# Patient Record
Sex: Female | Born: 1940 | Race: White | Hispanic: No | Smoking: Former smoker
Health system: Southern US, Community
[De-identification: ages and names within clinical notes are randomized; demographics above are authoritative.]

## PROBLEM LIST (undated history)

## (undated) DIAGNOSIS — G43909 Migraine, unspecified, not intractable, without status migrainosus: Secondary | ICD-10-CM

## (undated) DIAGNOSIS — K219 Gastro-esophageal reflux disease without esophagitis: Secondary | ICD-10-CM

## (undated) DIAGNOSIS — M1990 Unspecified osteoarthritis, unspecified site: Secondary | ICD-10-CM

## (undated) DIAGNOSIS — D759 Disease of blood and blood-forming organs, unspecified: Secondary | ICD-10-CM

## (undated) DIAGNOSIS — R0602 Shortness of breath: Secondary | ICD-10-CM

## (undated) DIAGNOSIS — E785 Hyperlipidemia, unspecified: Secondary | ICD-10-CM

## (undated) DIAGNOSIS — N189 Chronic kidney disease, unspecified: Secondary | ICD-10-CM

## (undated) DIAGNOSIS — M545 Low back pain, unspecified: Secondary | ICD-10-CM

## (undated) DIAGNOSIS — I10 Essential (primary) hypertension: Secondary | ICD-10-CM

## (undated) DIAGNOSIS — J449 Chronic obstructive pulmonary disease, unspecified: Secondary | ICD-10-CM

## (undated) DIAGNOSIS — R32 Unspecified urinary incontinence: Secondary | ICD-10-CM

## (undated) DIAGNOSIS — Z9289 Personal history of other medical treatment: Secondary | ICD-10-CM

## (undated) DIAGNOSIS — J189 Pneumonia, unspecified organism: Secondary | ICD-10-CM

## (undated) DIAGNOSIS — I2699 Other pulmonary embolism without acute cor pulmonale: Secondary | ICD-10-CM

## (undated) DIAGNOSIS — D649 Anemia, unspecified: Secondary | ICD-10-CM

## (undated) DIAGNOSIS — K625 Hemorrhage of anus and rectum: Secondary | ICD-10-CM

## (undated) DIAGNOSIS — Z9981 Dependence on supplemental oxygen: Secondary | ICD-10-CM

## (undated) DIAGNOSIS — J939 Pneumothorax, unspecified: Secondary | ICD-10-CM

## (undated) DIAGNOSIS — J42 Unspecified chronic bronchitis: Secondary | ICD-10-CM

## (undated) DIAGNOSIS — G8929 Other chronic pain: Secondary | ICD-10-CM

## (undated) DIAGNOSIS — J45909 Unspecified asthma, uncomplicated: Secondary | ICD-10-CM

## (undated) DIAGNOSIS — R51 Headache: Secondary | ICD-10-CM

## (undated) HISTORY — PX: TUBAL LIGATION: SHX77

## (undated) HISTORY — PX: CATARACT EXTRACTION W/ INTRAOCULAR LENS  IMPLANT, BILATERAL: SHX1307

## (undated) HISTORY — DX: Anemia, unspecified: D64.9

## (undated) HISTORY — PX: HEMIARTHROPLASTY SHOULDER FRACTURE: SUR653

## (undated) HISTORY — PX: FRACTURE SURGERY: SHX138

## (undated) HISTORY — PX: LAPAROSCOPIC CHOLECYSTECTOMY: SUR755

## (undated) HISTORY — DX: Hyperlipidemia, unspecified: E78.5

## (undated) HISTORY — PX: ABDOMINAL HYSTERECTOMY: SHX81

---

## 2003-09-22 ENCOUNTER — Emergency Department (HOSPITAL_COMMUNITY): Admission: EM | Admit: 2003-09-22 | Discharge: 2003-09-22 | Payer: Self-pay

## 2004-06-05 ENCOUNTER — Inpatient Hospital Stay (HOSPITAL_COMMUNITY): Admission: EM | Admit: 2004-06-05 | Discharge: 2004-06-20 | Payer: Self-pay | Admitting: Emergency Medicine

## 2004-06-16 ENCOUNTER — Encounter (INDEPENDENT_AMBULATORY_CARE_PROVIDER_SITE_OTHER): Payer: Self-pay | Admitting: *Deleted

## 2004-07-03 ENCOUNTER — Encounter: Admission: RE | Admit: 2004-07-03 | Discharge: 2004-07-03 | Payer: Self-pay | Admitting: Cardiothoracic Surgery

## 2006-02-02 IMAGING — CR DG CHEST 2V
2 series · 2 of 2 positions shown · non-contrast
Comparison: none

CLINICAL DATA: Chest pain and shortness of breath.  Weakness.
 TWO-VIEW CHEST   
 Pulmonary hyperinflation is seen, consistent with COPD.  Prominence of the interstitial markings is noted.  There is no evidence of acute infiltrate or edema.  There is no evidence of pleural effusion.  No masses or adenopathy identified.
 IMPRESSION 
 COPD.  No active disease.

[view not recorded (1 of 2)]
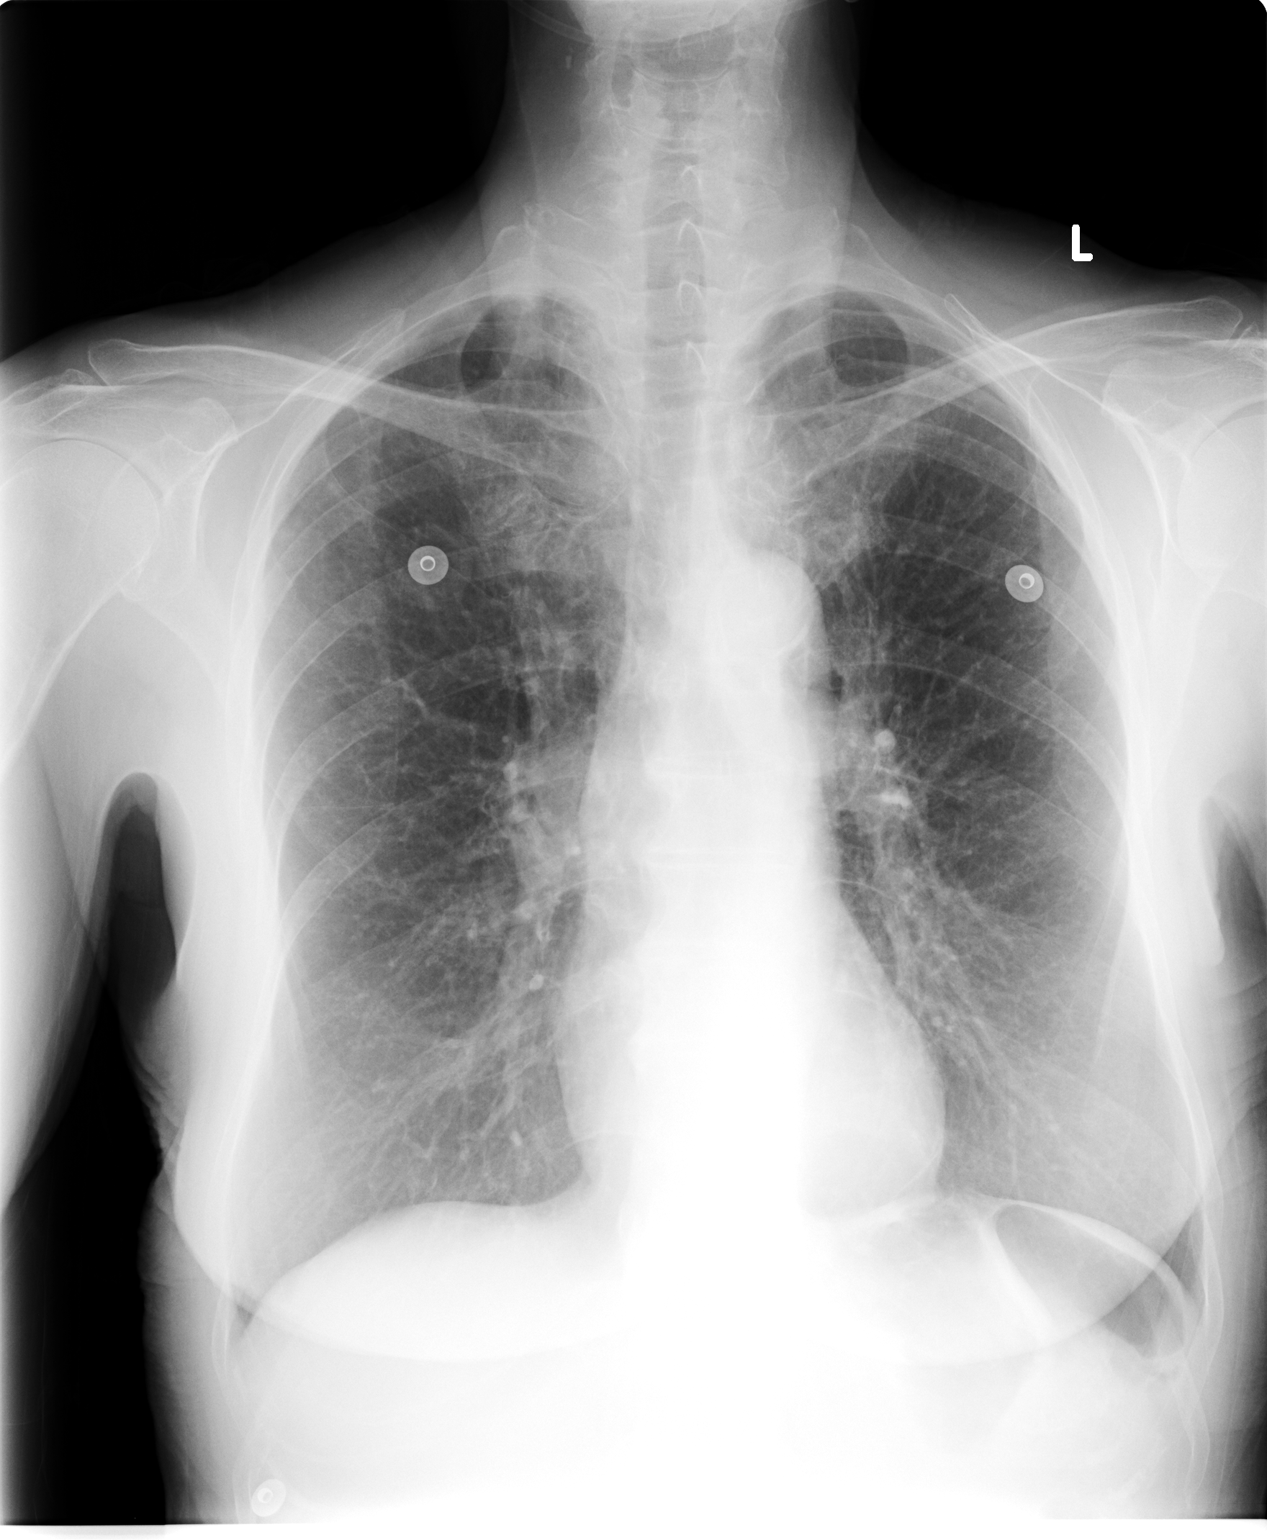

[view not recorded (2 of 2)]
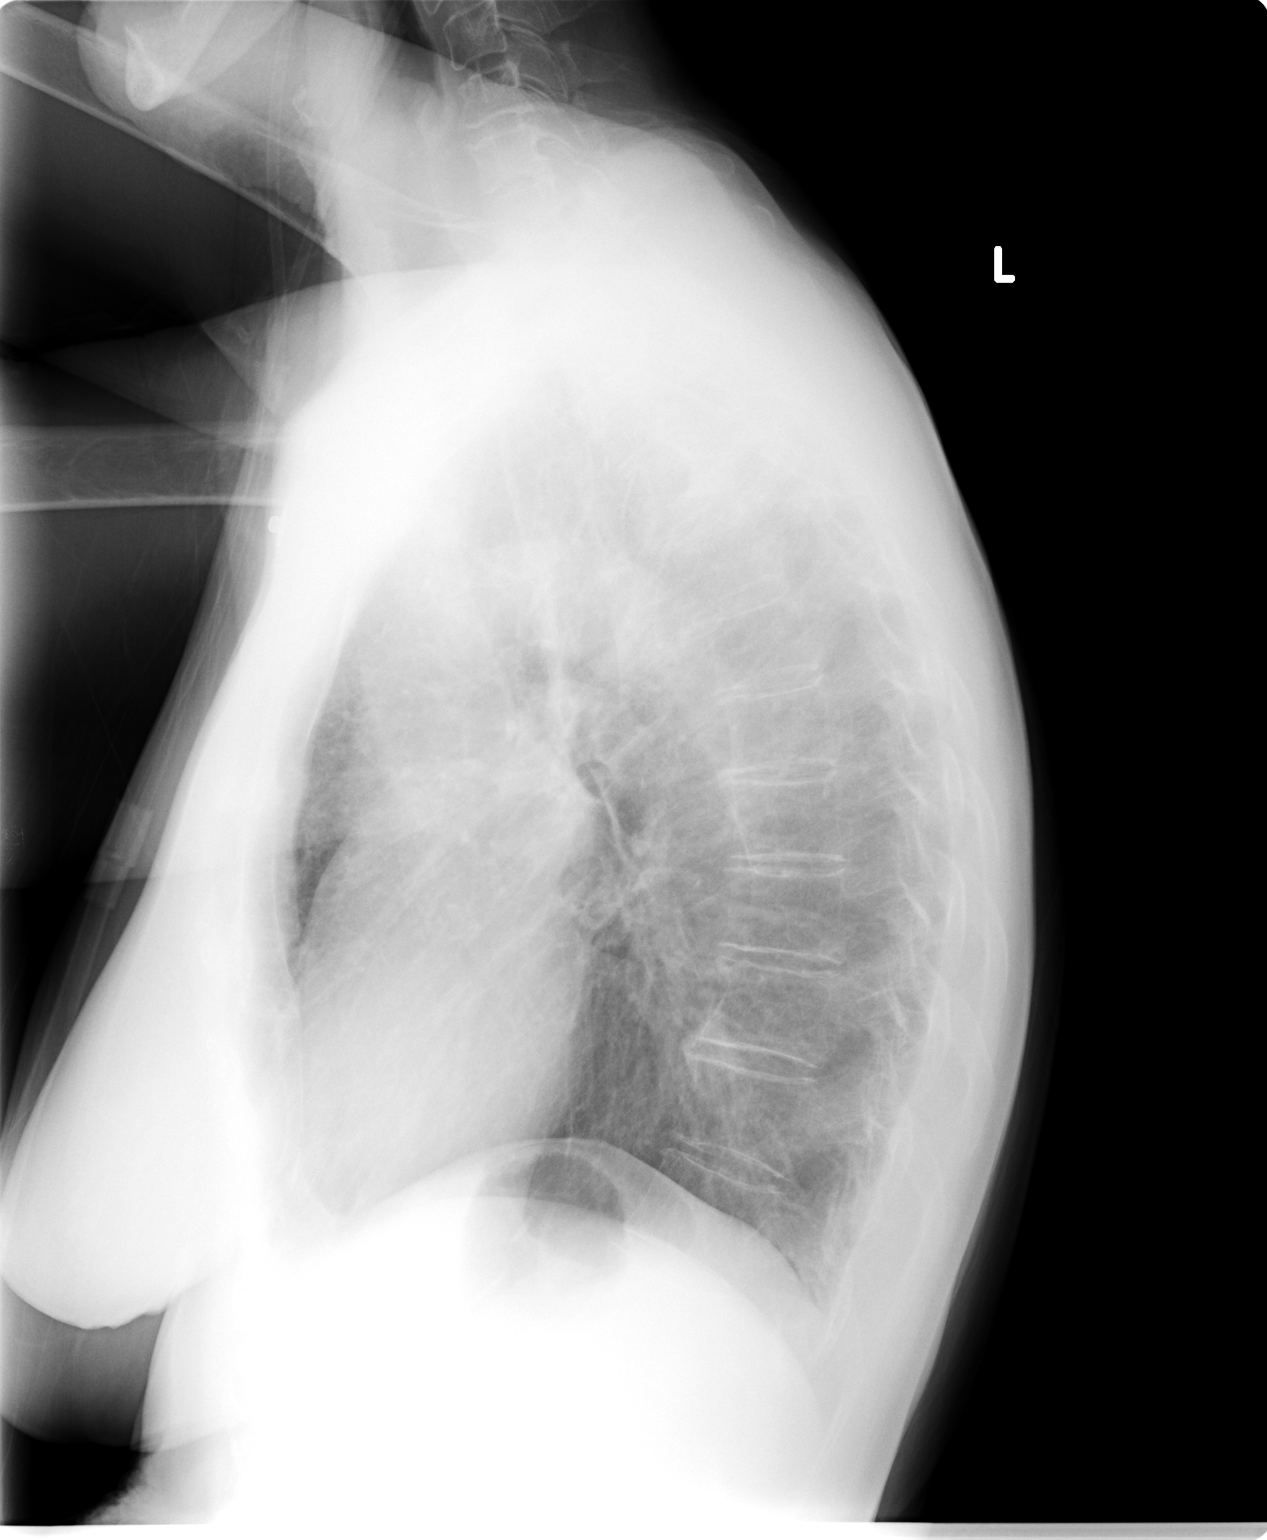

[2 of 2 positions shown; findings below may reference images not displayed]

## 2006-06-13 ENCOUNTER — Inpatient Hospital Stay (HOSPITAL_COMMUNITY): Admission: EM | Admit: 2006-06-13 | Discharge: 2006-06-17 | Payer: Self-pay | Admitting: Emergency Medicine

## 2006-09-06 ENCOUNTER — Encounter (INDEPENDENT_AMBULATORY_CARE_PROVIDER_SITE_OTHER): Payer: Self-pay | Admitting: Family Medicine

## 2006-09-06 ENCOUNTER — Ambulatory Visit (HOSPITAL_COMMUNITY): Admission: RE | Admit: 2006-09-06 | Discharge: 2006-09-06 | Payer: Self-pay | Admitting: Family Medicine

## 2006-10-17 IMAGING — CR DG CHEST 2V
2 series · 2 of 2 positions shown · non-contrast
Comparison: 09/22/03.

CLINICAL DATA: Chest pain and shortness of breath.  COPD.
 [id number] VIEW:

[w chest pa]
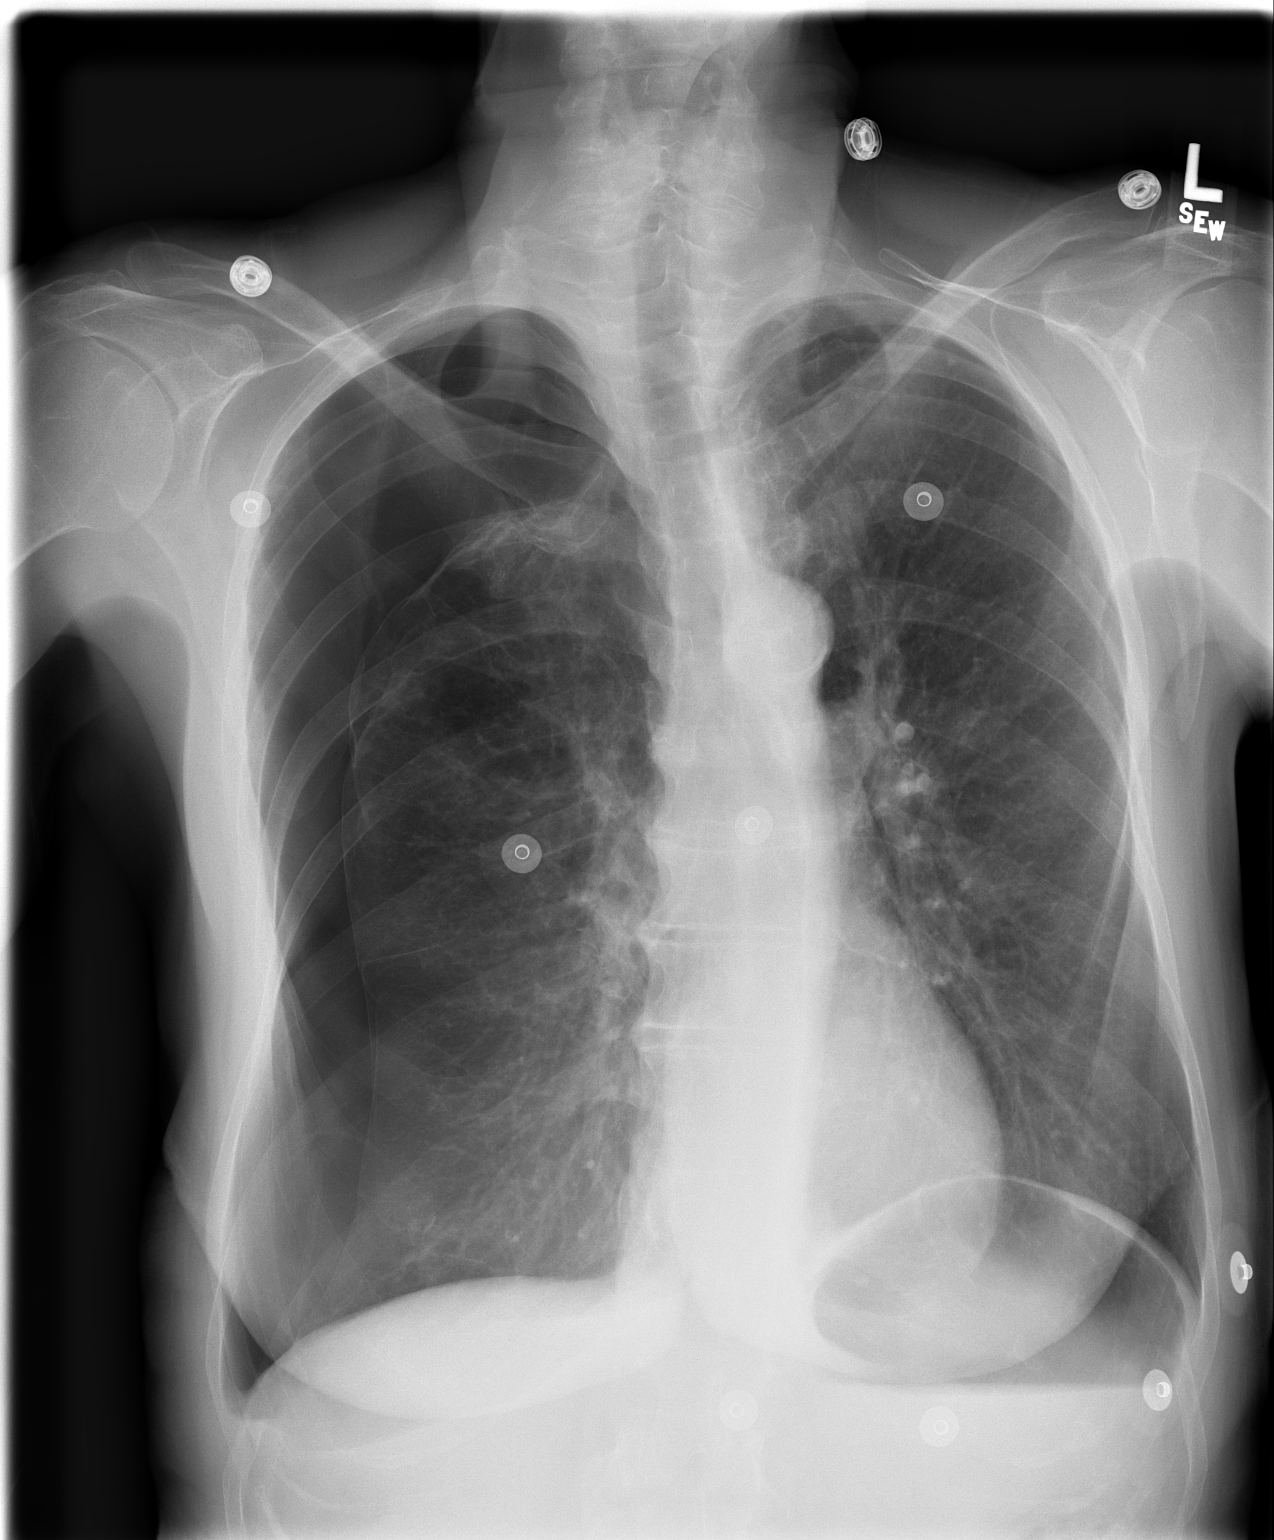

[w chest lat]
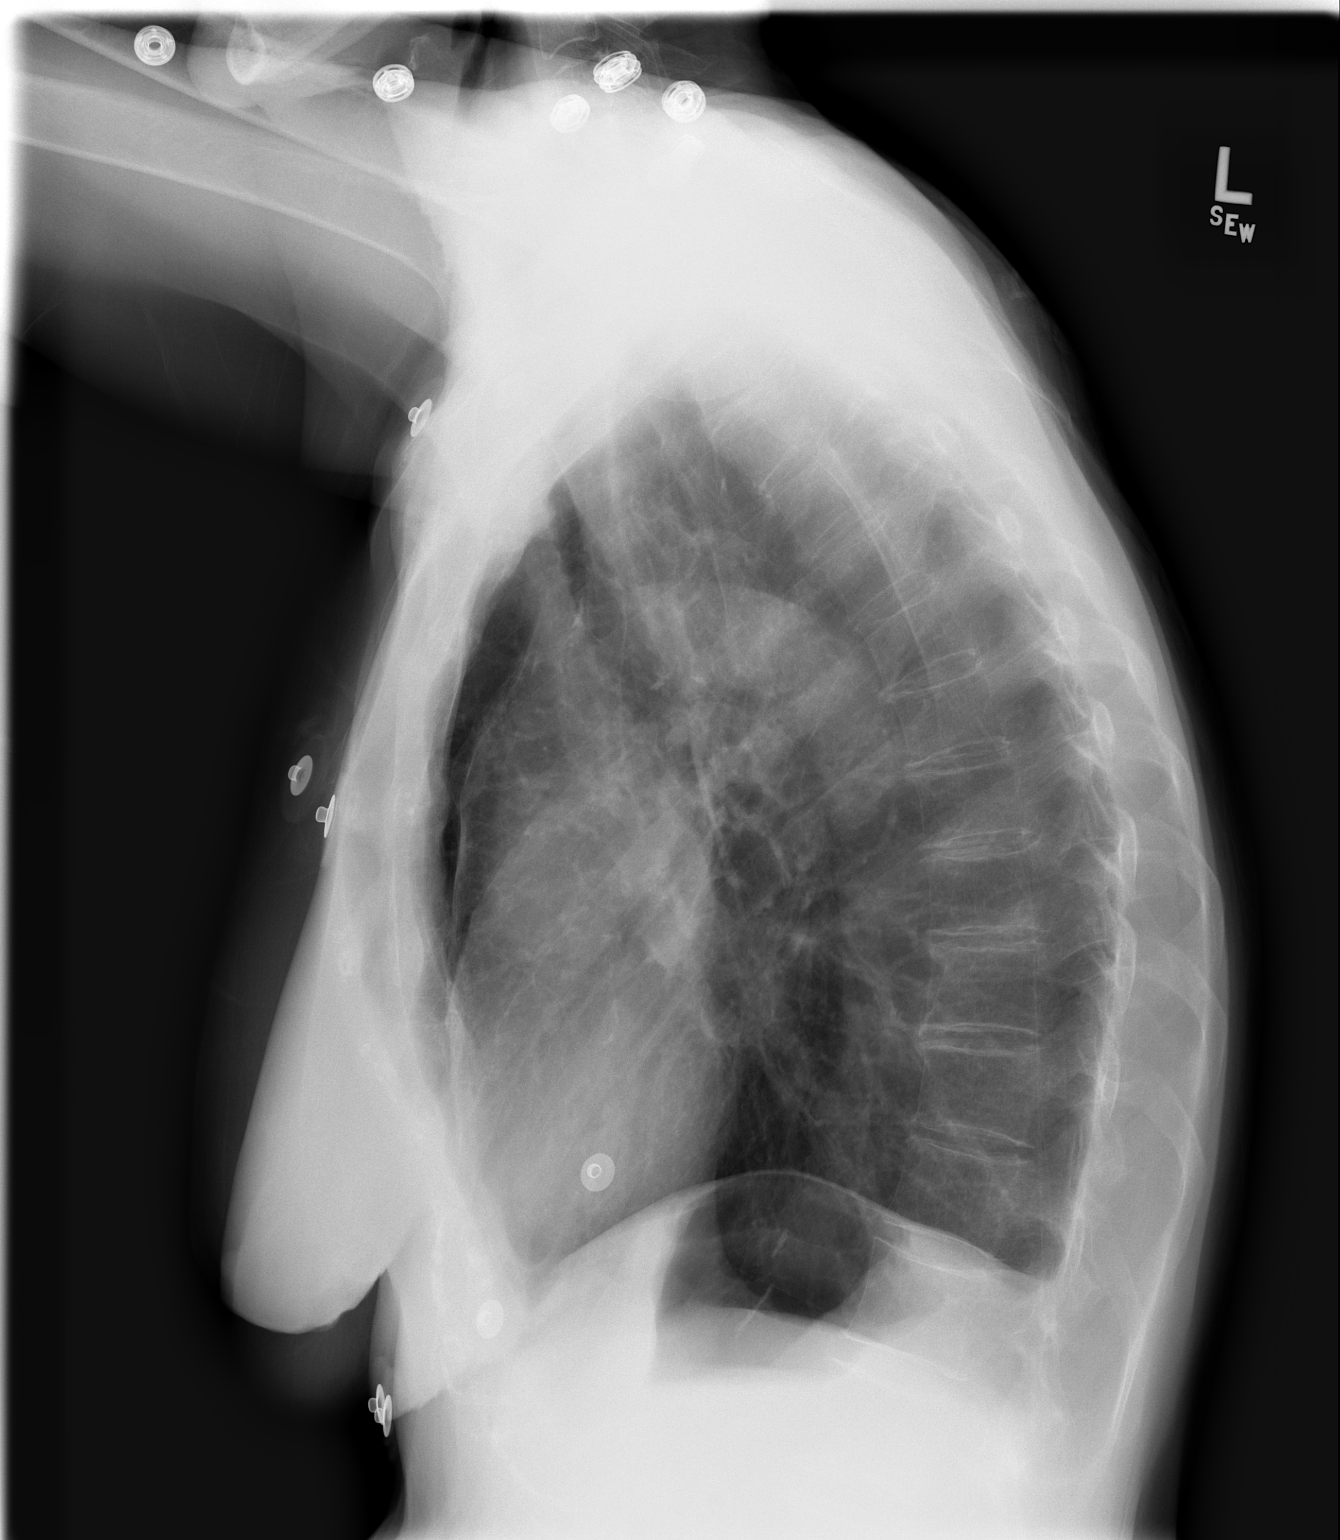

[2 of 2 positions shown; findings below may reference images not displayed]

A large approximately 40% right pneumothorax is seen with mild mediastinal shift.
 Changes of COPD are noted. There is no evidence of pulmonary infiltrate. Heart size and mediastinal contours are normal.
IMPRESSION: 1.  Large right tension pneumothorax approximately 40% in size.  
 2.  COPD.
 3.  These findings were called to Dr. Rkoma in the [HOSPITAL] the time of this dictation at 9369 hours.

## 2006-10-17 IMAGING — CR DG CHEST 1V PORT
1 series · 1 of 1 positions shown · non-contrast
Comparison: Prior study earlier today.

CLINICAL DATA: Follow-up right pneumothorax after chest tube placement.  COPD.  
 PORTABLE CHEST - 1 VIEW 06/05/04 AT 5525 HOURS:

[view not recorded]
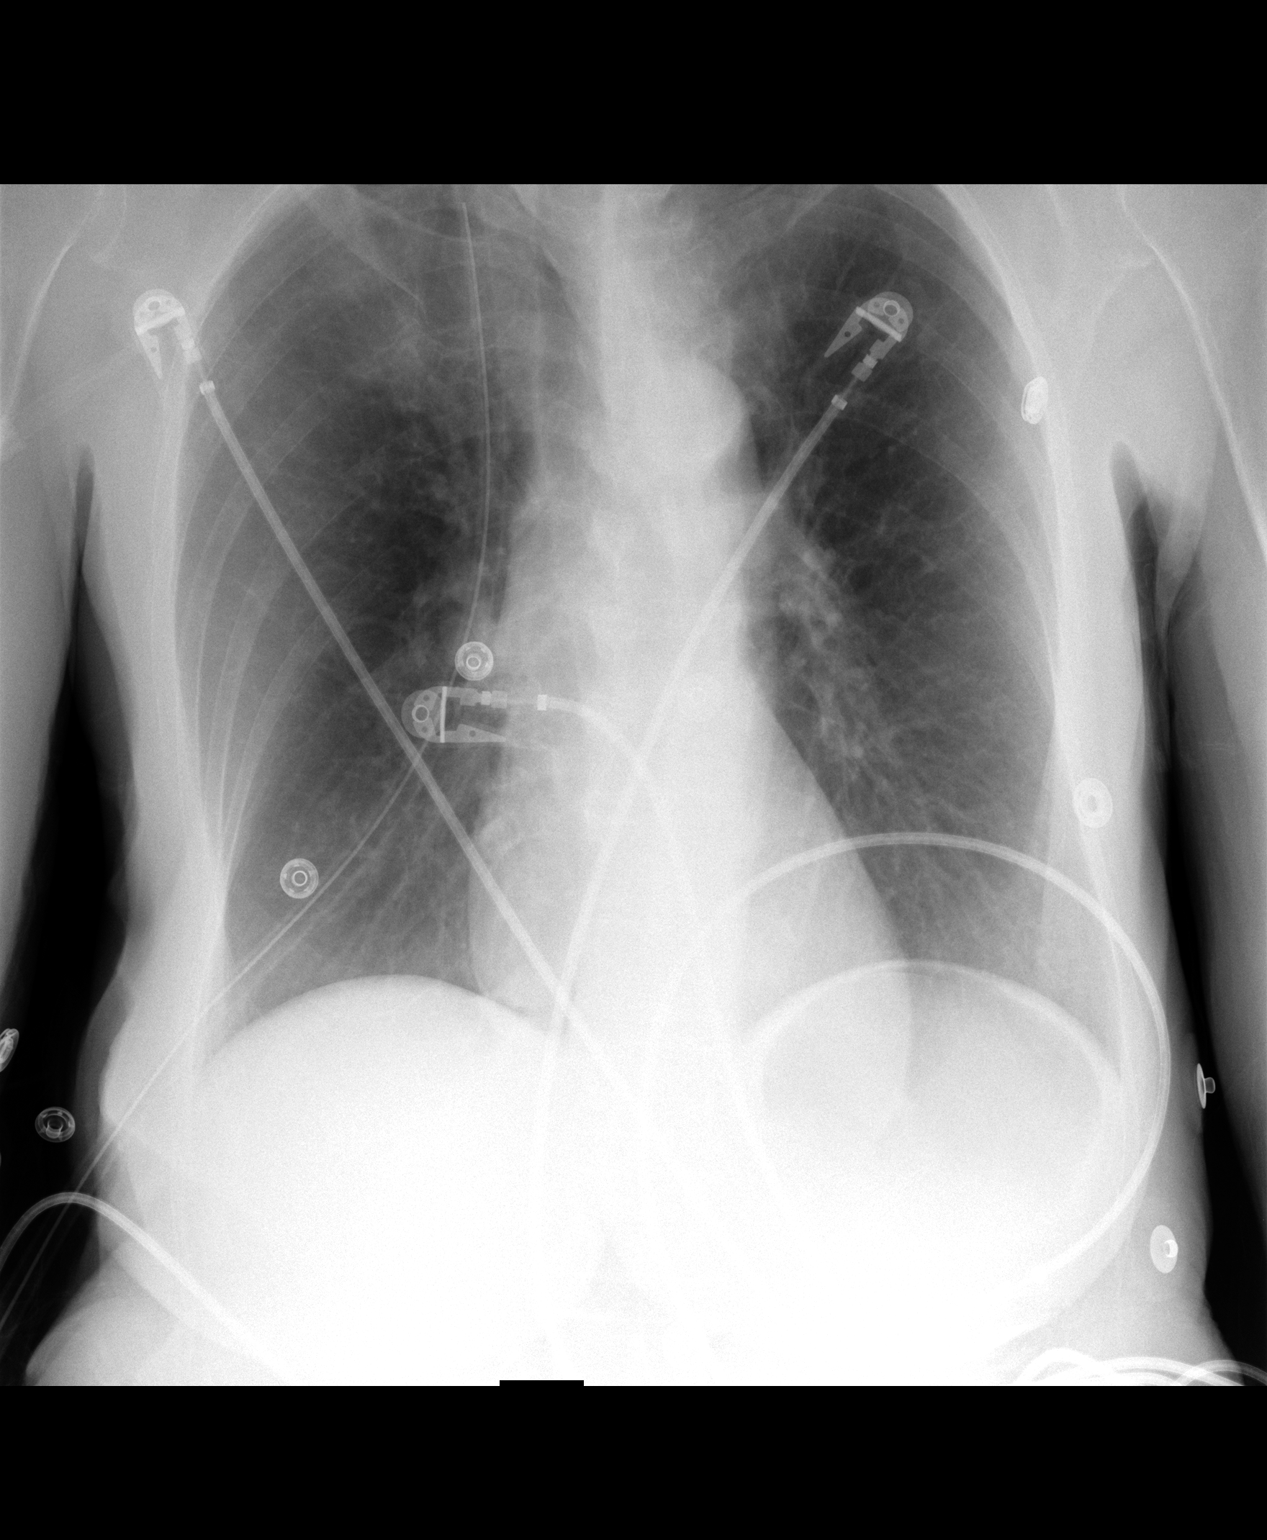

[1 of 1 positions shown; findings below may reference images not displayed]

There has been placement of a right-sided chest tube in appropriate position.  Near complete resolution of right pneumothorax is seen with only a tiny residual, less than 5 percent, right apical component seen.  
 Changes of COPD are again noted.  Both lungs are clear.  Heart size is normal.
IMPRESSION: 1.  Near complete resolution of right pneumothorax following chest tube placement.  
 2.  COPD.

## 2006-10-19 IMAGING — CR DG CHEST 1V PORT
1 series · 1 of 1 positions shown · non-contrast
Comparison: 06/05/04.

CLINICAL DATA: Placement of right chest tube for pneumothorax.
 PORTABLE SINGLE VIEW CHEST ? 06/07/04, [DATE]:

[view not recorded]
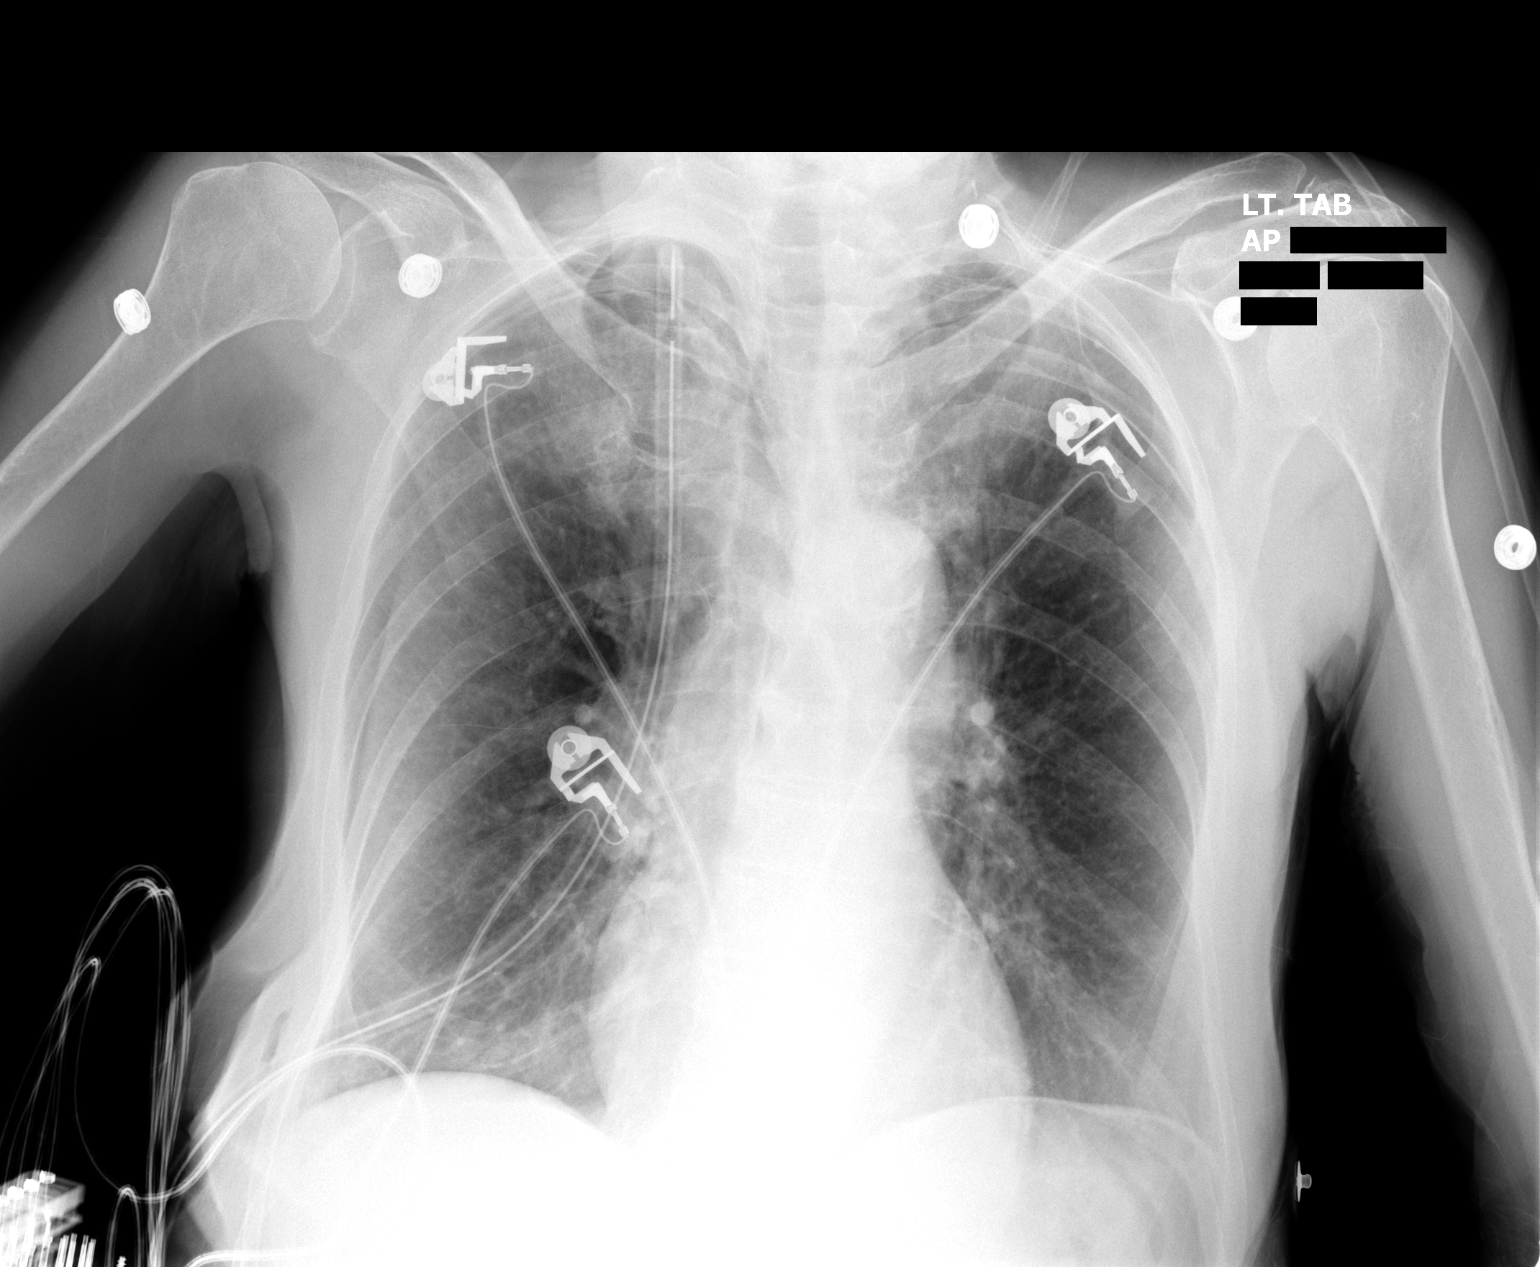

[1 of 1 positions shown; findings below may reference images not displayed]

Right tube remains in place.  There is slight interval increase in size of right apical pneumothorax now estimated to be 5 to 10 percent of volume.  No underlying edema.
IMPRESSION: Increase in right apical pneumothorax to 5 to 10 percent volume.

## 2006-10-20 IMAGING — CR DG CHEST 1V PORT
1 series · 1 of 1 positions shown · non-contrast
Comparison: 06/07/2004

CLINICAL DATA: Pneumothorax, chest tube

PORTABLE CHEST - 1 VIEW:

[view not recorded]
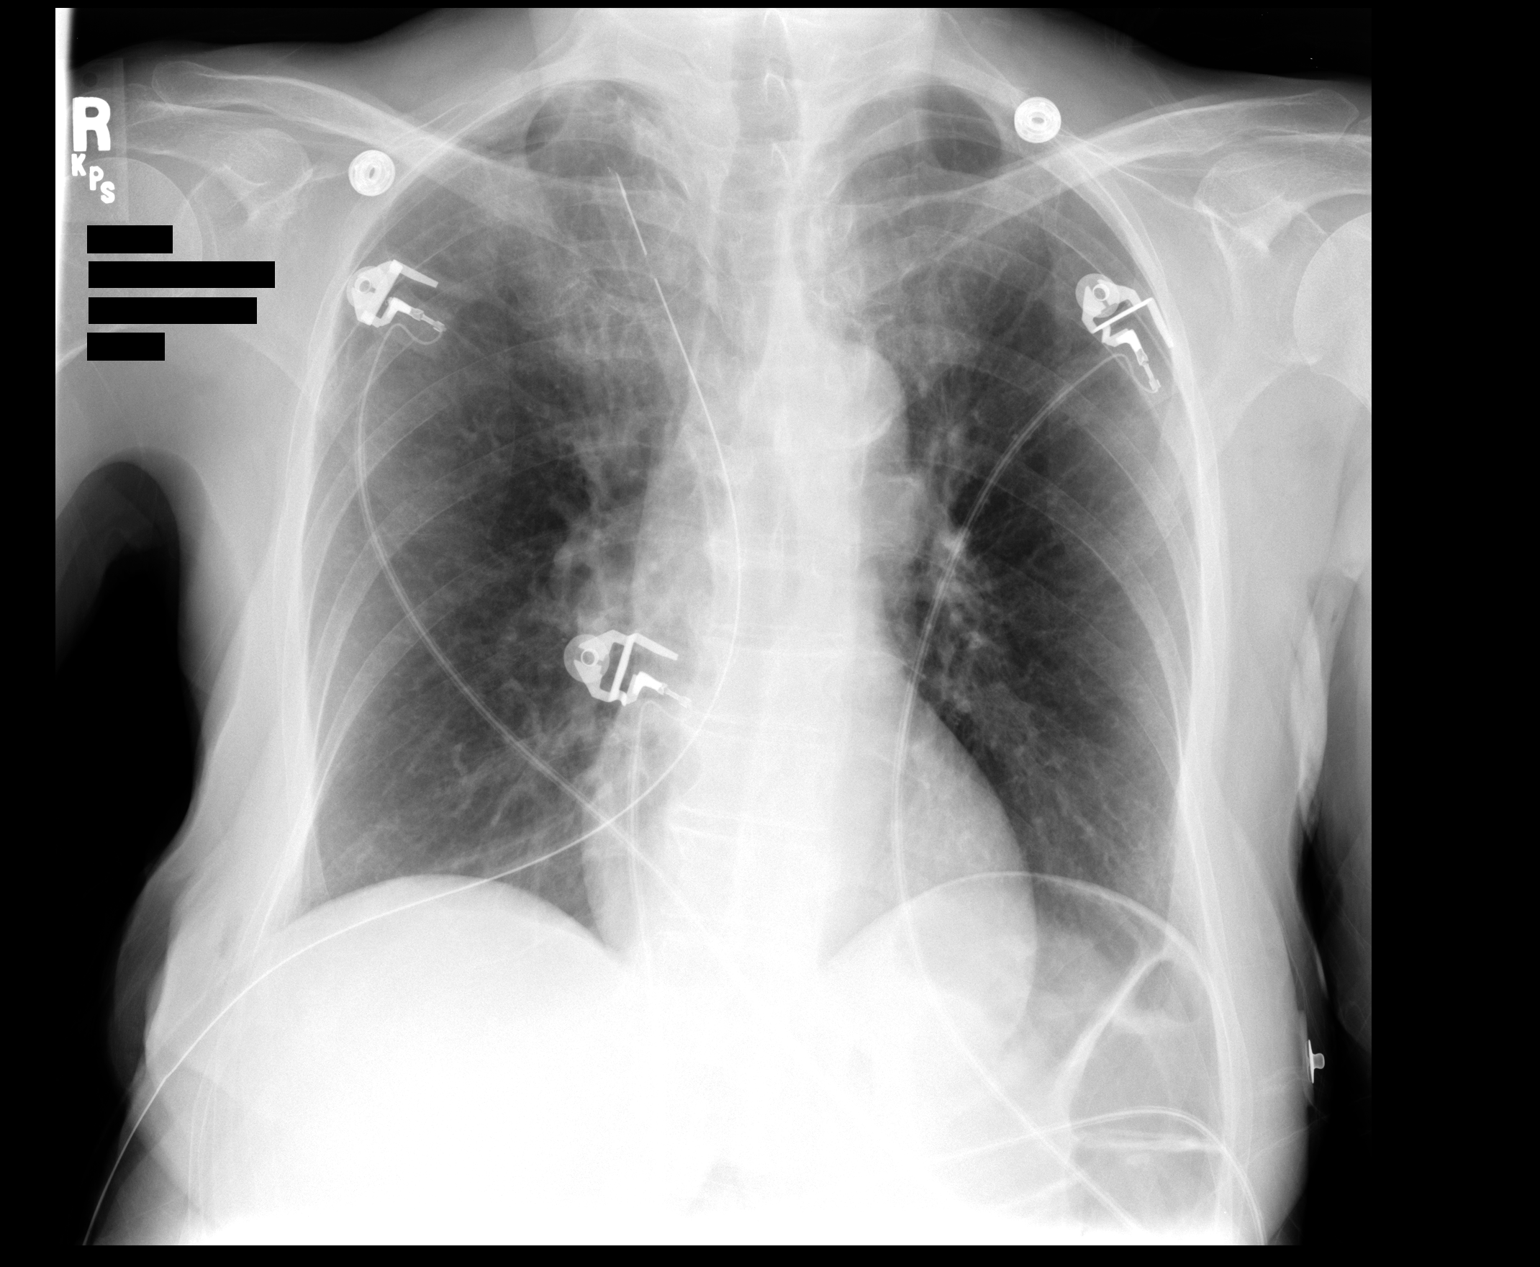

[1 of 1 positions shown; findings below may reference images not displayed]

FINDINGS: Right pneumothorax is decreased slightly, now less than 5%. Right
chest tube unchanged. Heart is normal size. No focal airspace opacities.
IMPRESSION: Decreasing right pneumothorax, now less than 5%.

## 2006-10-21 IMAGING — CR DG CHEST 1V PORT
1 series · 1 of 1 positions shown · non-contrast
Comparison: none

CLINICAL DATA: 64-year-old with pneumothorax.  Chest tube placement.
 CHEST PORTABLE ONE VIEW:
 Single portable view of the chest is compared to previous studies from [DATE] and 06/07/04.  The right-sided chest tube is stable.  There is a small apical pneumothorax estimated at 5%.  Persistent right upper lobe scarring type changes and underlying changes of COPD.  No infiltrates or effusions.

[view not recorded]
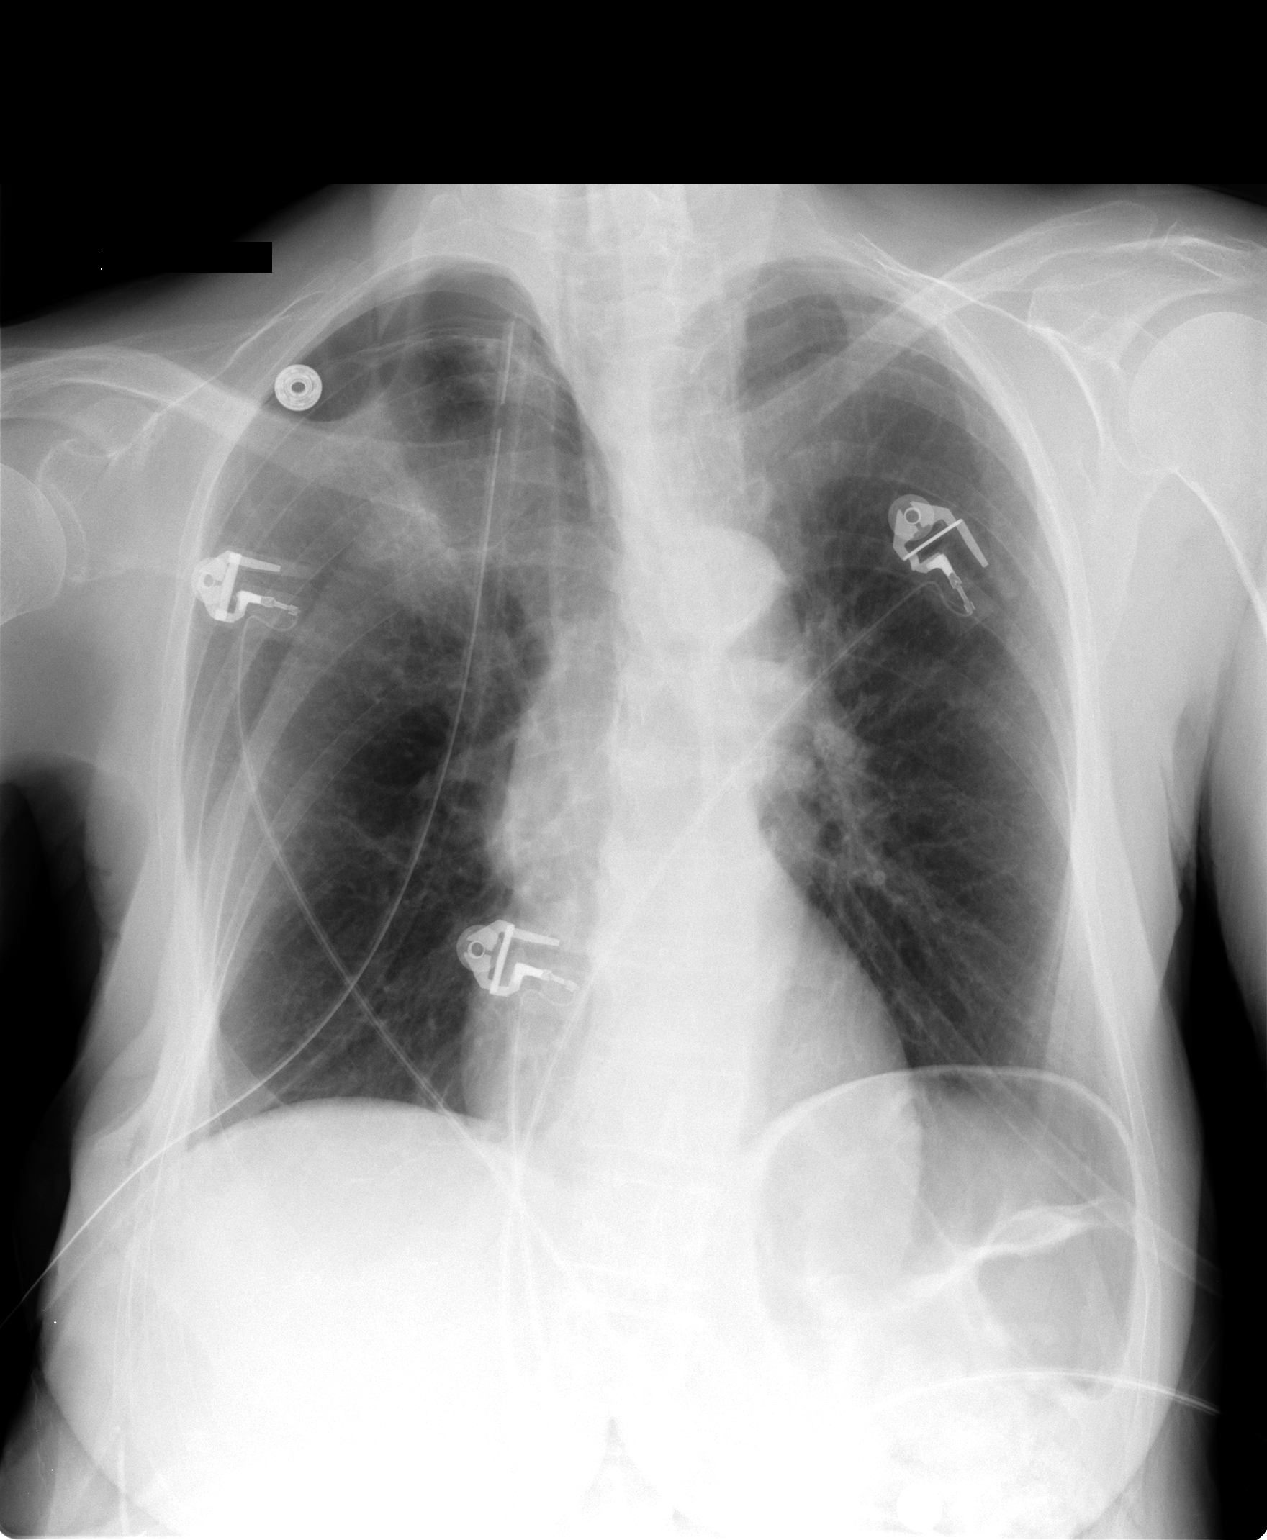

[1 of 1 positions shown; findings below may reference images not displayed]

IMPRESSION: 1.  Small right apical pneumothorax.
 2.   No infiltrates, edema or effusions.  
 3.  Persistent right upper lobe density may reflect scarring change or atelectasis.

## 2006-10-23 IMAGING — CR DG CHEST 1V PORT
1 series · 1 of 1 positions shown · non-contrast
Comparison: 06/10/04.

CLINICAL DATA: Chest tubes, pneumothorax. 
 CHEST PORTABLE ? 1 VIEW, 06/11/04 AT 2752:

[view not recorded]
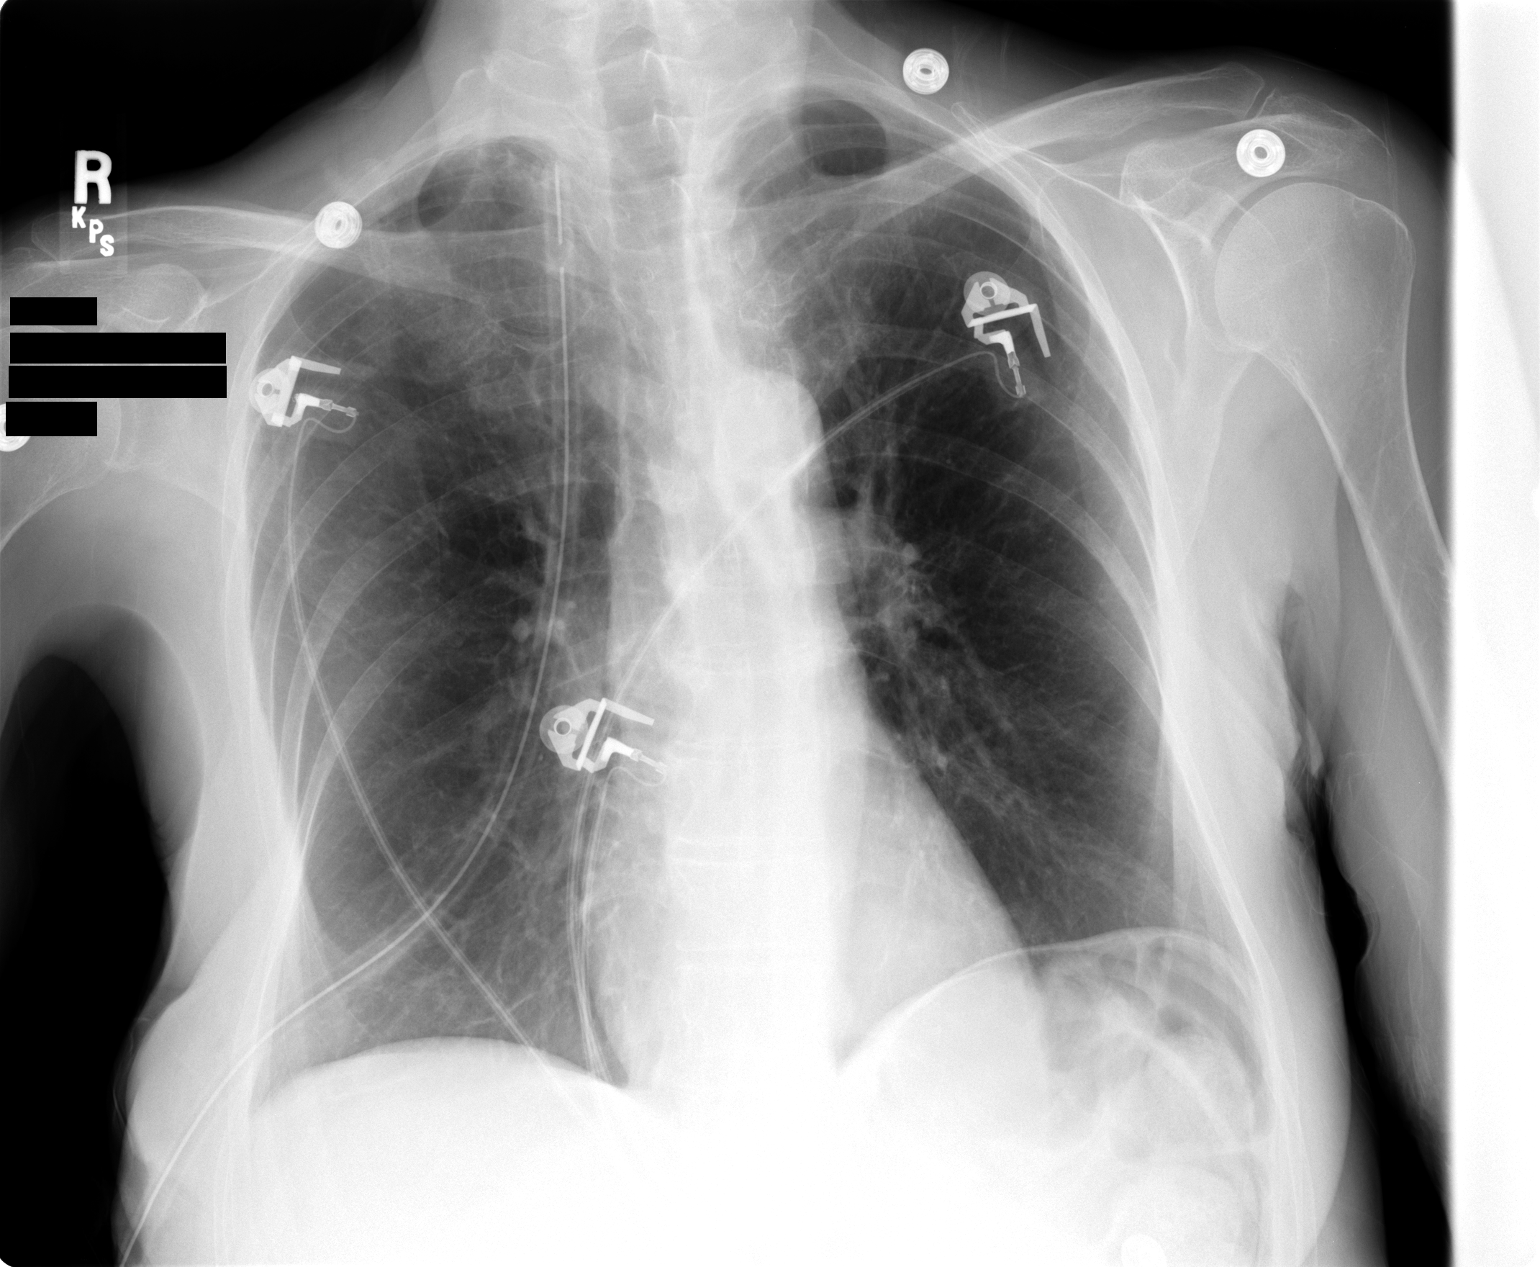

[1 of 1 positions shown; findings below may reference images not displayed]

Right chest tube remains in place.  Tiny right apical pneumothorax again noted, unchanged.  Nodular density described over the right apex is not visualized on today?s study.  COPD changes noted.
IMPRESSION: Stable right apical pneumothorax.  COPD.  No change.

## 2006-10-24 IMAGING — CR DG CHEST 1V PORT
1 series · 1 of 1 positions shown · non-contrast
Comparison: 06/11/2004.

CLINICAL DATA: Pneumothorax, chest tube.
 PORTABLE CHEST, ONE VIEW ? 06/12/2004 ? (0710 HOURS):

[view not recorded]
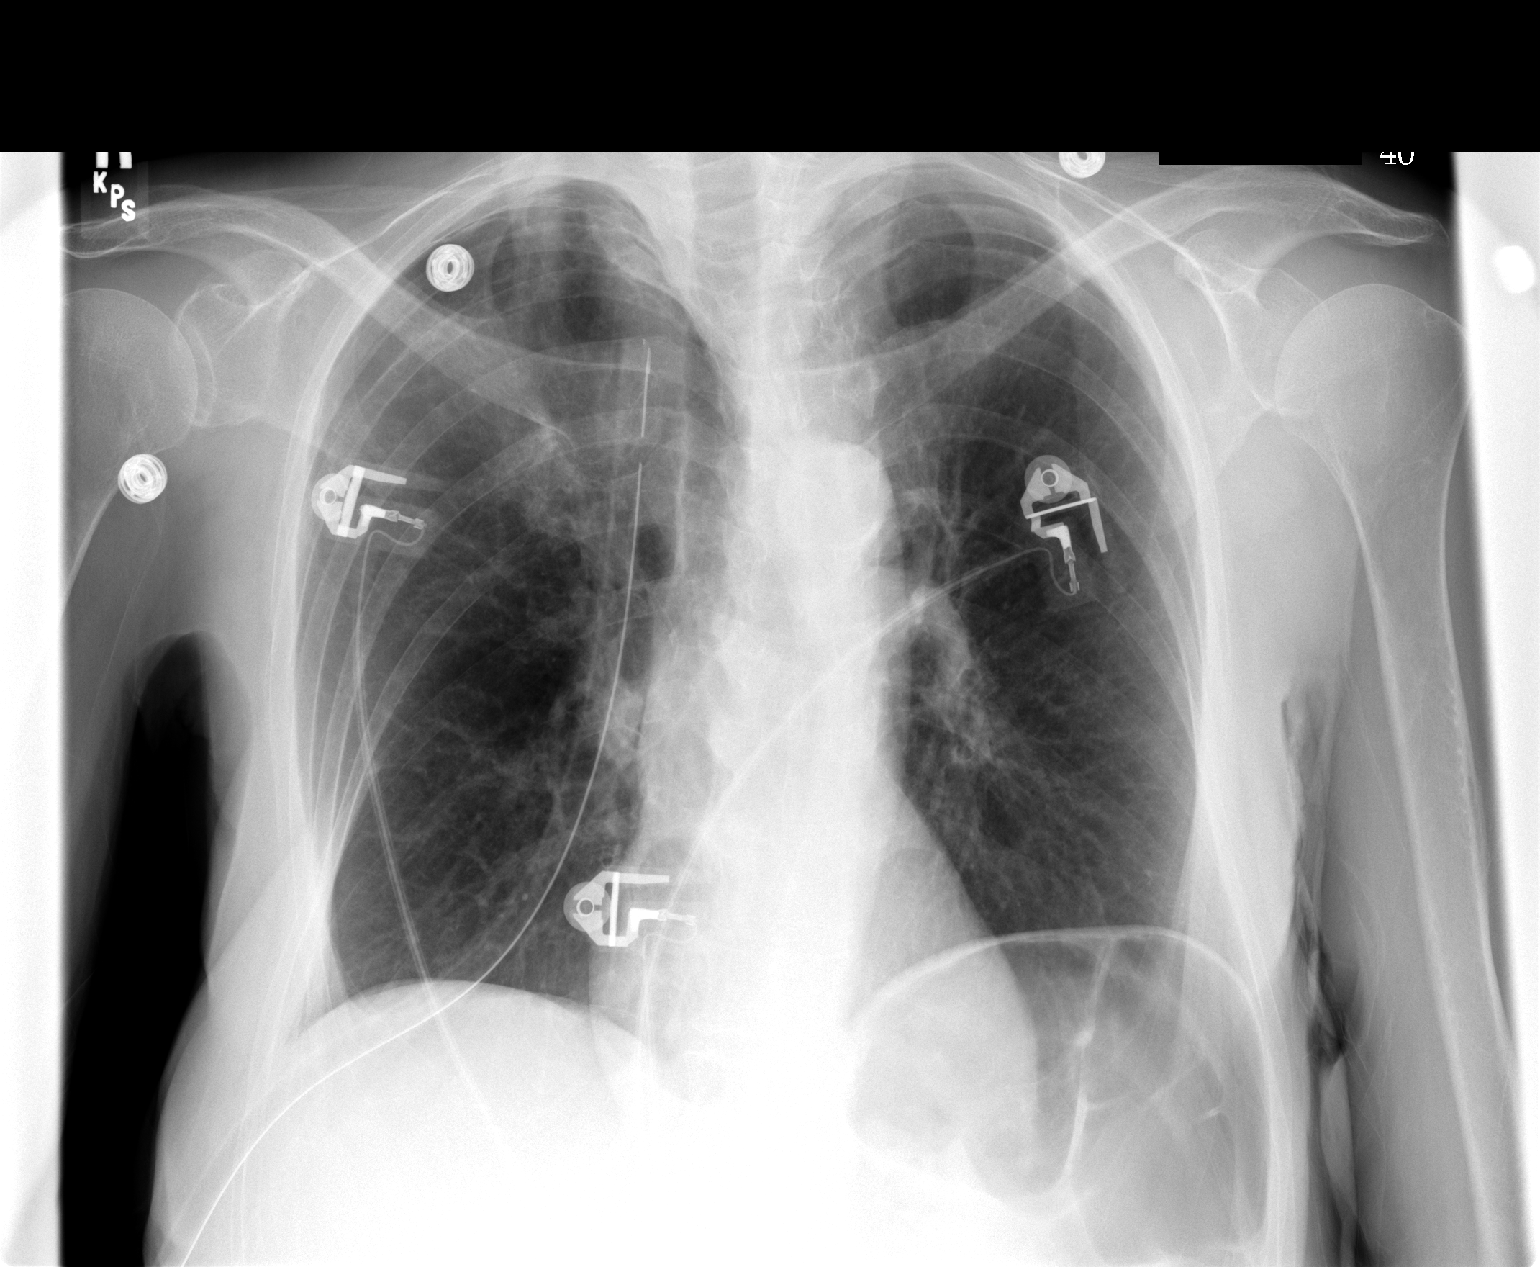

[1 of 1 positions shown; findings below may reference images not displayed]

FINDINGS: Right chest tube remains in place with tiny right apical pneumothorax persisting.  COPD change is present.  No change since prior study.
IMPRESSION: No significant change.  Tiny right apical pneumothorax.

## 2006-10-25 IMAGING — CR DG CHEST 1V PORT
1 series · 1 of 1 positions shown · non-contrast
Comparison: none

CLINICAL DATA: Pneumothorax

Portable chest 6576:
Comparison 06/12/2004. Right chest tube stable position. Interval increase in
right apical pneumothorax estimated 10-15%. Left lung remains clear. Heart size
normal. Mild atheromatous change in the aortic arch.

[view not recorded]
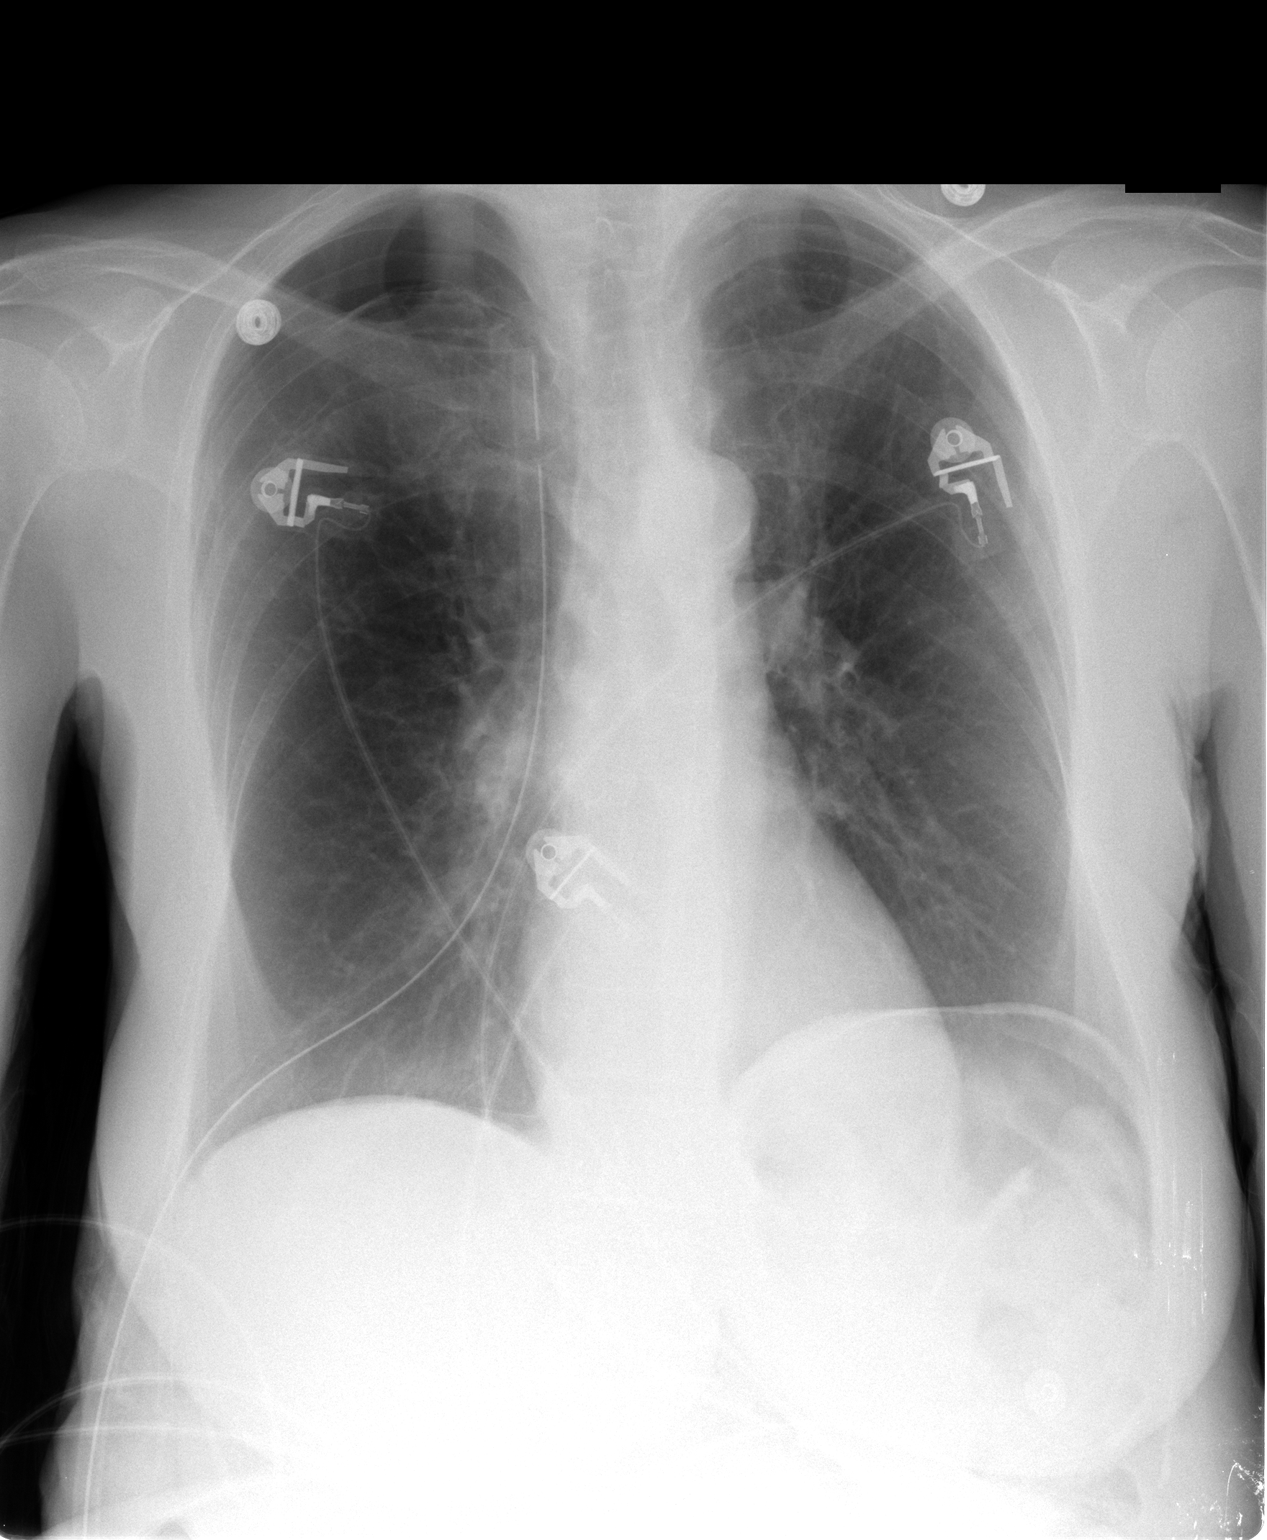

[1 of 1 positions shown; findings below may reference images not displayed]

IMPRESSION: 1. Interval increase in right apical pneumothorax estimated 10-15%, with chest
tube stable in position

## 2006-10-26 IMAGING — CR DG CHEST 2V
3 series · 3 of 3 positions shown · non-contrast
Comparison: 06/13/2004.

CLINICAL DATA: Pneumothorax.  
 CHEST - TWO VIEW:

[view not recorded (1 of 3)]
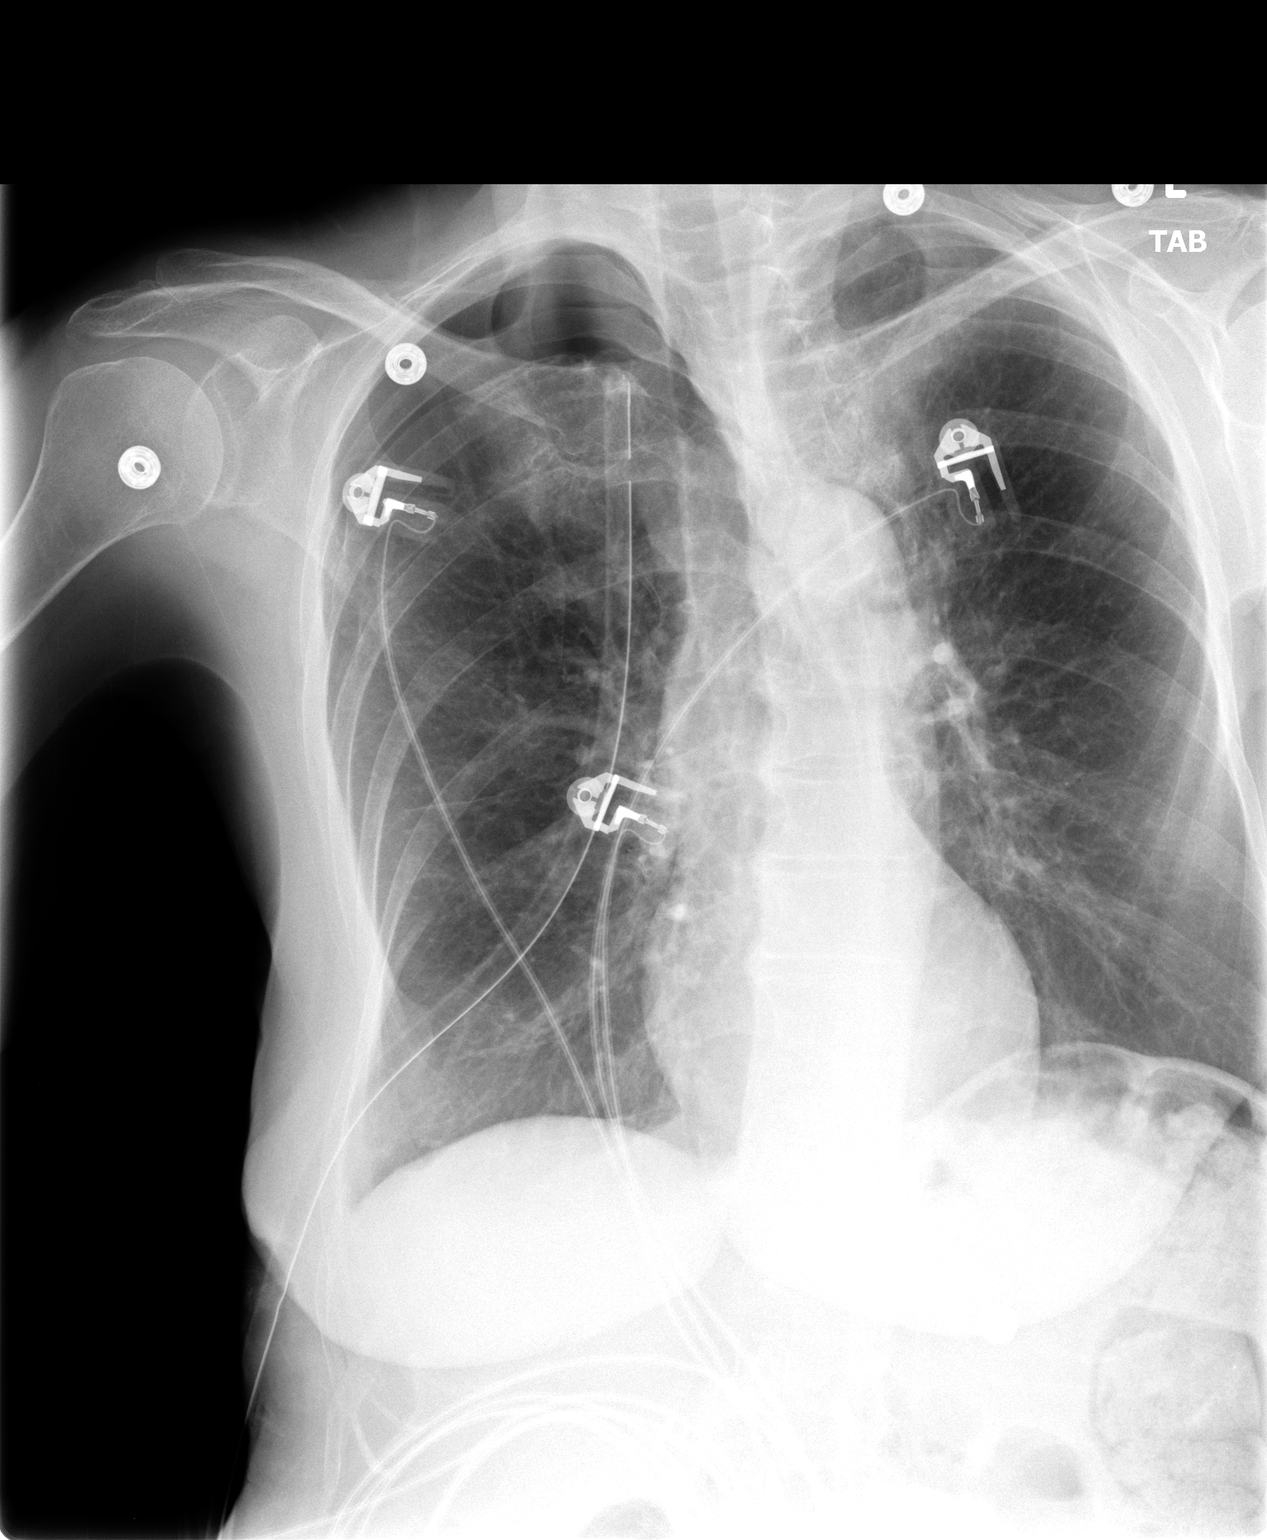

[view not recorded (2 of 3)]
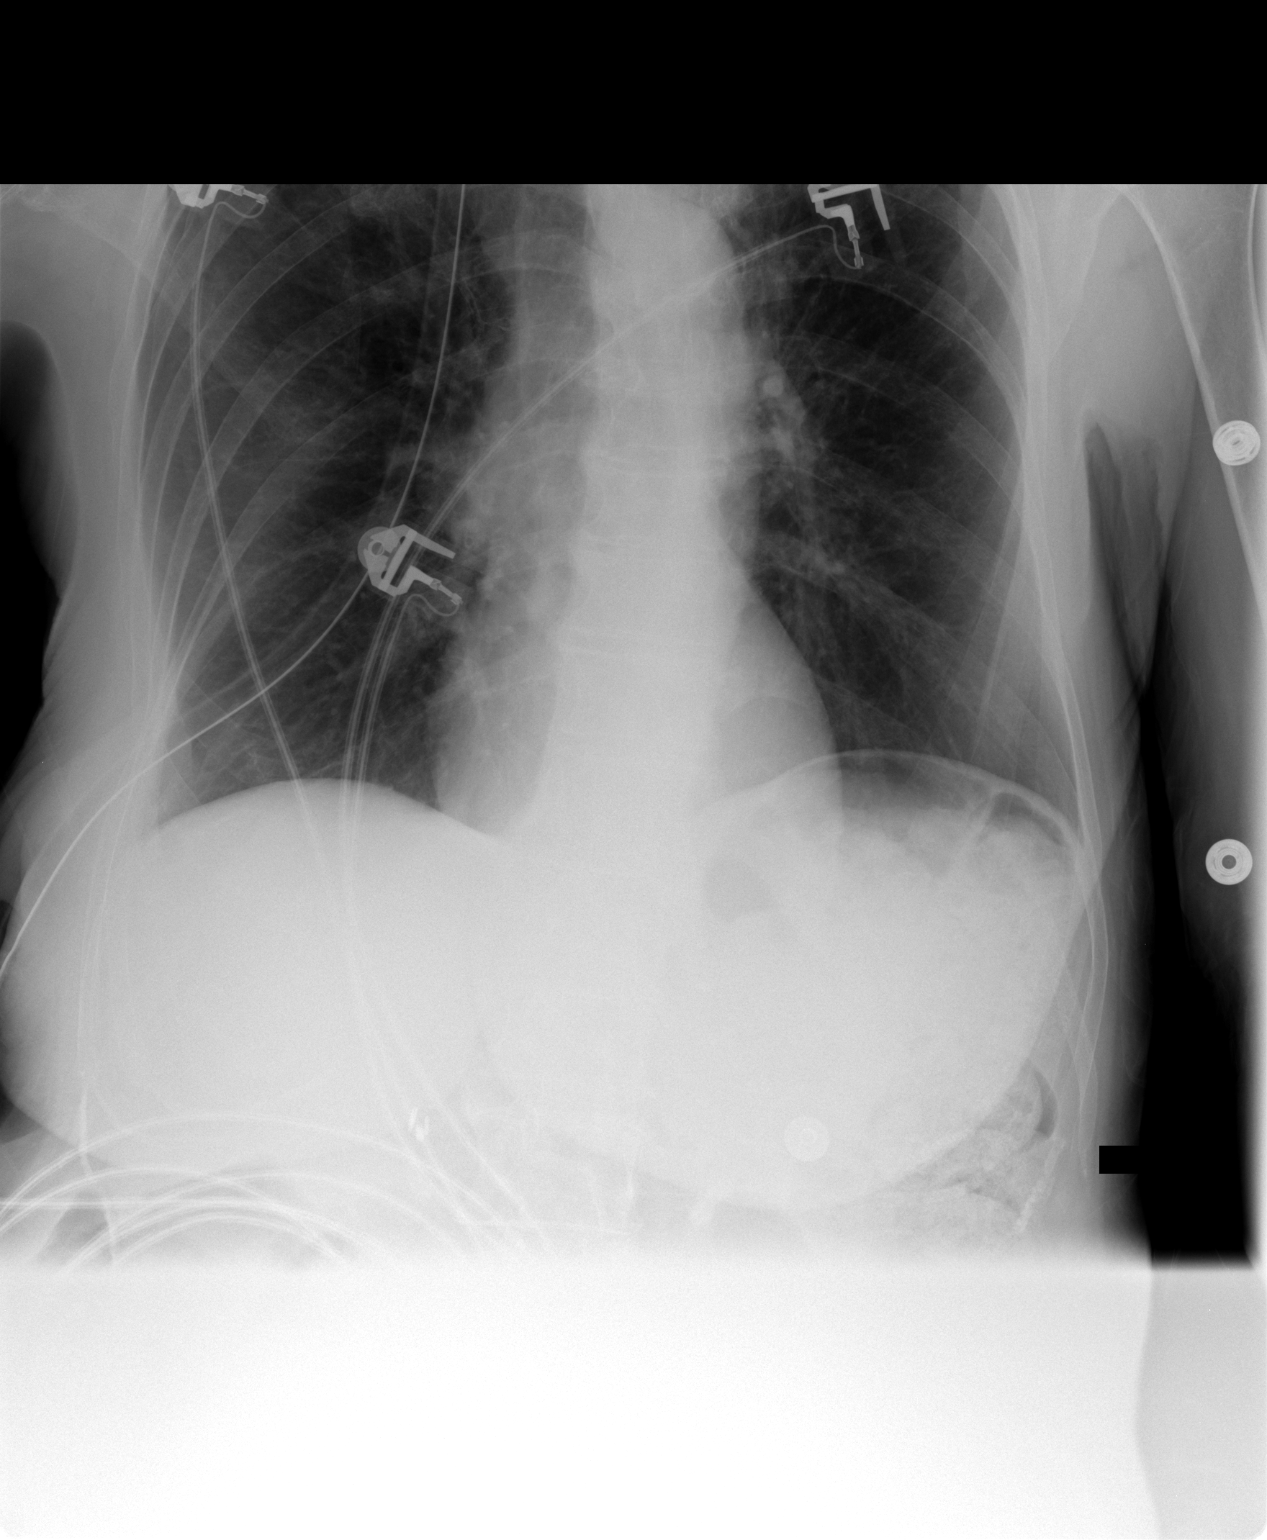

[view not recorded (3 of 3)]
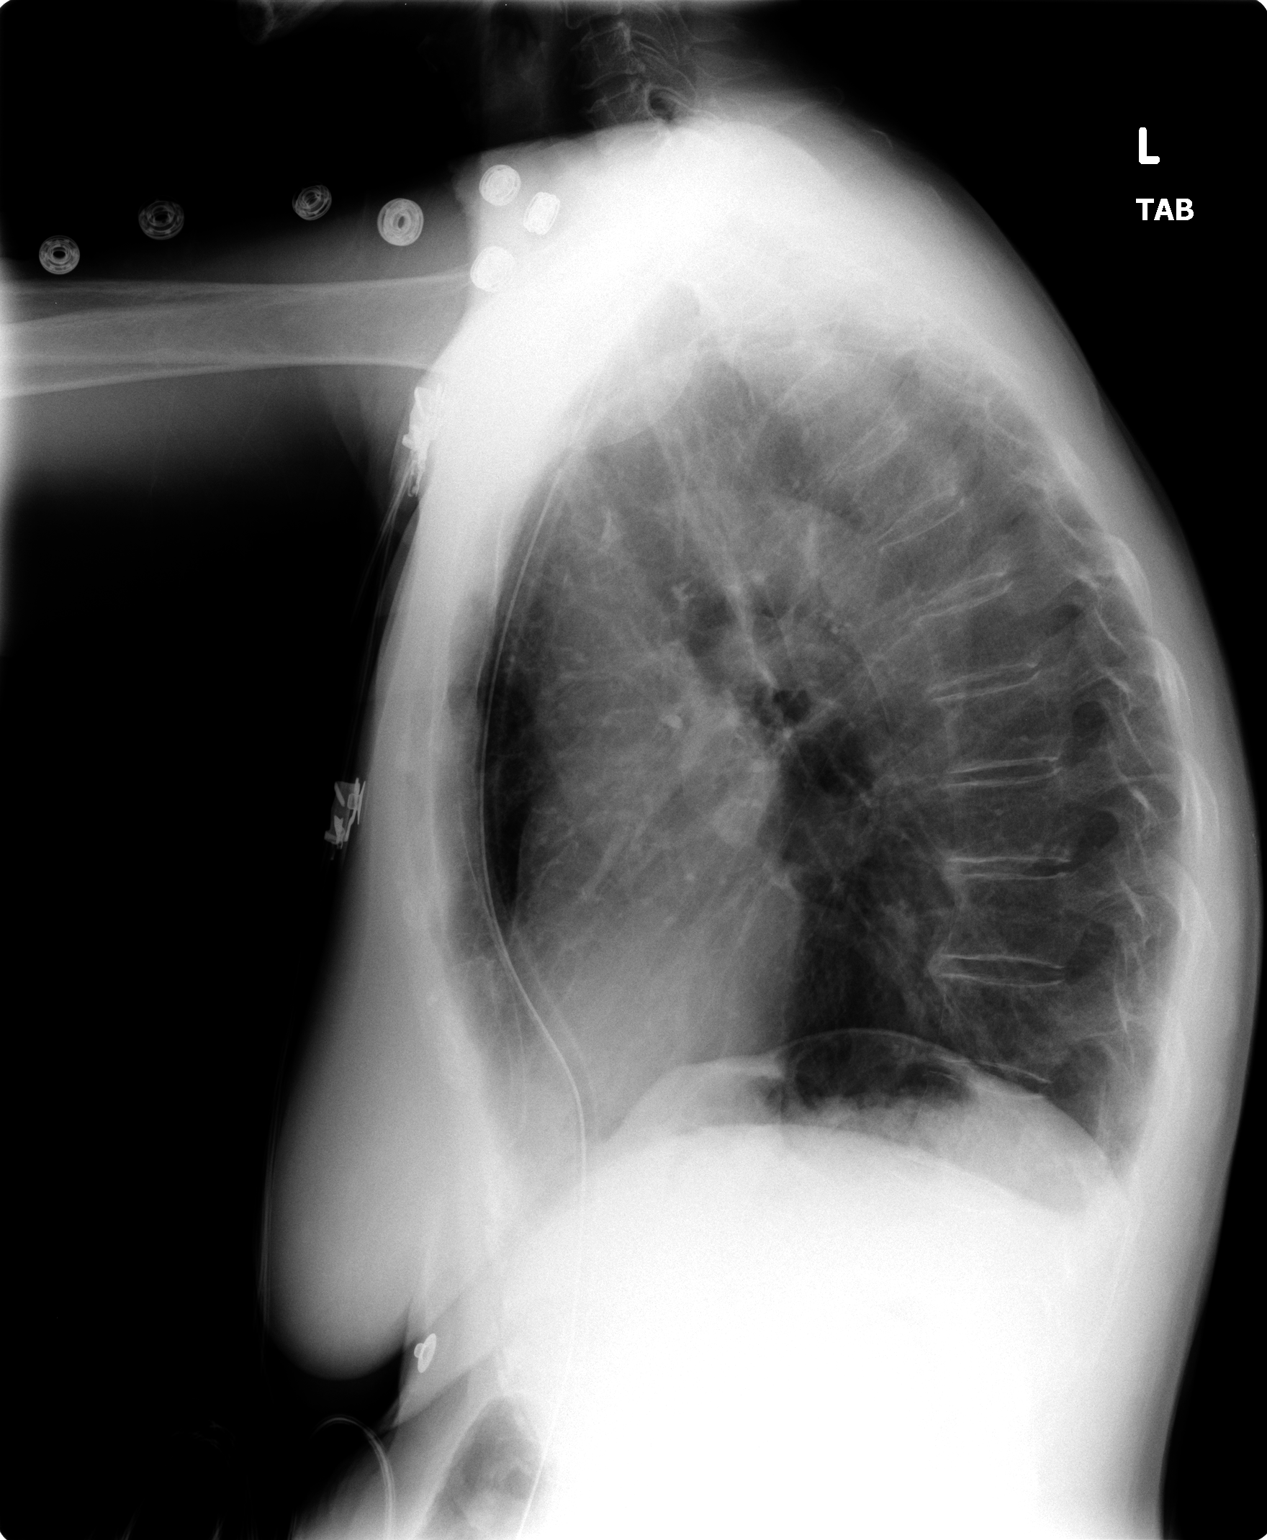

[3 of 3 positions shown; findings below may reference images not displayed]

Right thoracostomy tube and 10 to 15% right apical pneumothorax are stable.  Small right effusion is present.  Evidence of COPD noted.
IMPRESSION: 1.  Stable 10 to 15% right apical pneumothorax and right thoracostomy tube. 
 2.  COPD.

## 2006-10-27 IMAGING — CR DG CHEST 1V PORT
1 series · 1 of 1 positions shown · non-contrast
Comparison: none

CLINICAL DATA: 64 year old female right pneumothorax. 
PORTABLE CHEST SINGLE VIEW ? 06/15/04:

[view not recorded]
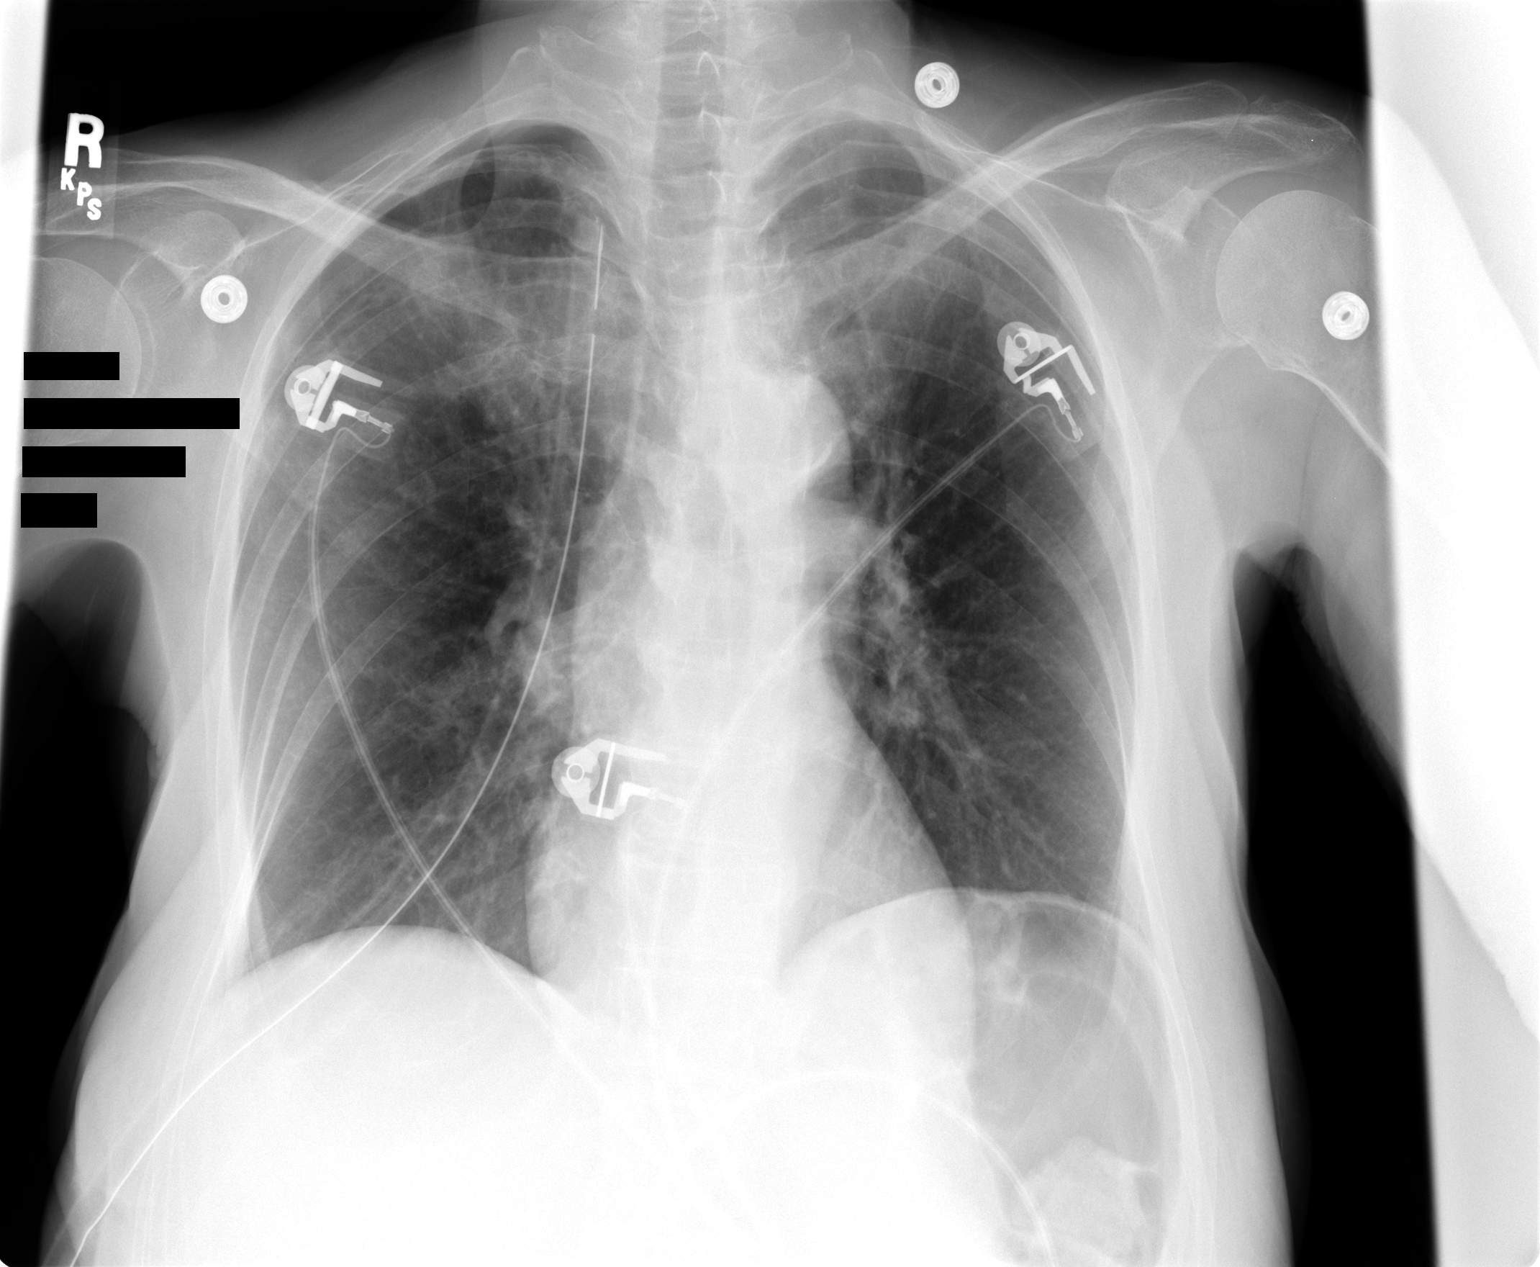

[1 of 1 positions shown; findings below may reference images not displayed]

FINDINGS: There is continued improvement in the right apical pneumothorax estimated at 10 percent. Right chest tube remains. Heart size is normal. Atherosclerosis of the aorta.  No consolidation or effusion.
IMPRESSION: Slight continued improvement in the right apical pneumothorax.

## 2006-10-27 IMAGING — CT CT CHEST W/ CM
1 series · 15 of 31 positions shown, 19 images · IV contrast (omnipaque)
Comparison: none

CLINICAL DATA: Spontaneous pneumothorax with continued air leak. Evaluate for blebs. 
CT CHEST WITH CONTRAST:
122cc of Omnipaque 300 was utilized.

[Series 2: chest w/ · axial · 0.59mm/px · z∈[-286,-6]mm · 15 of 62 slices shown, 19 images]
[im 3/62  mediastinal]
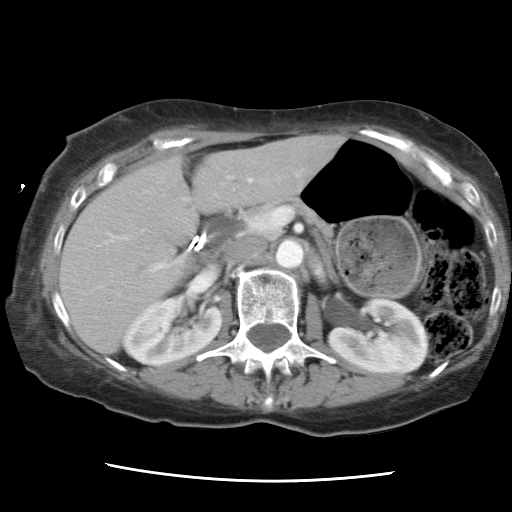
[im 3/62  lung]
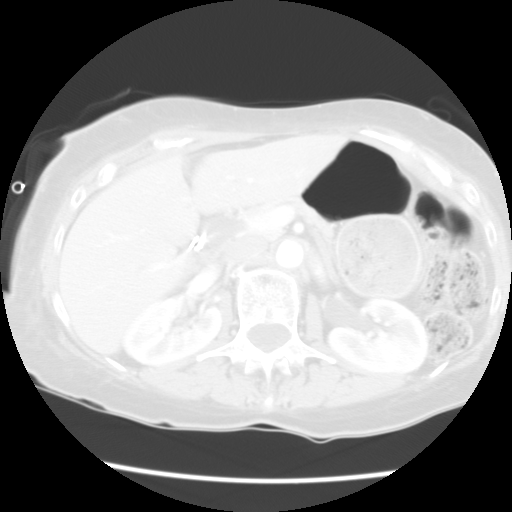
[im 7/62  lung]
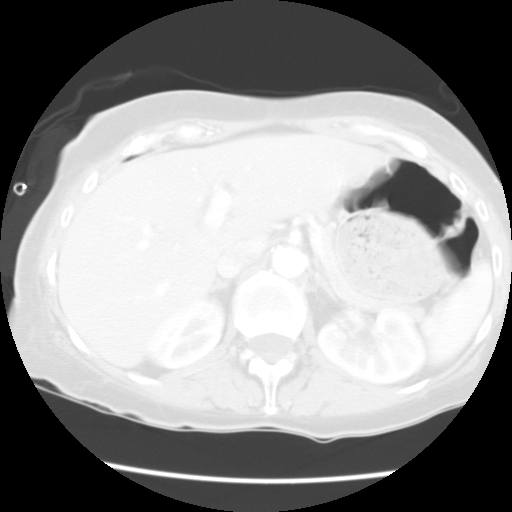
[im 12/62  lung]
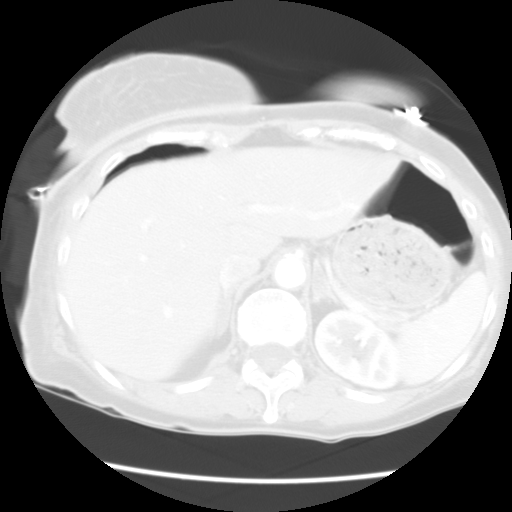
[im 14/62  lung]
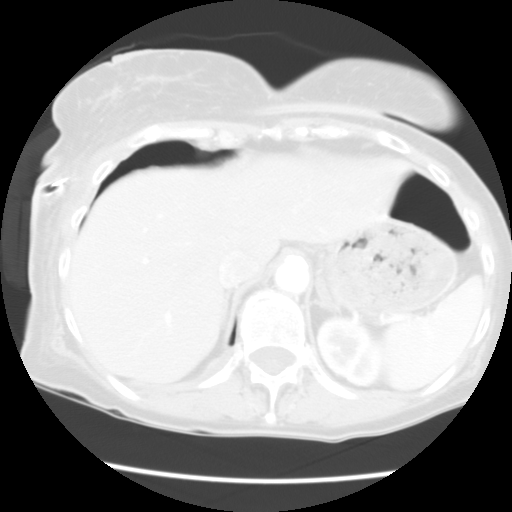
[im 19/62  mediastinal]
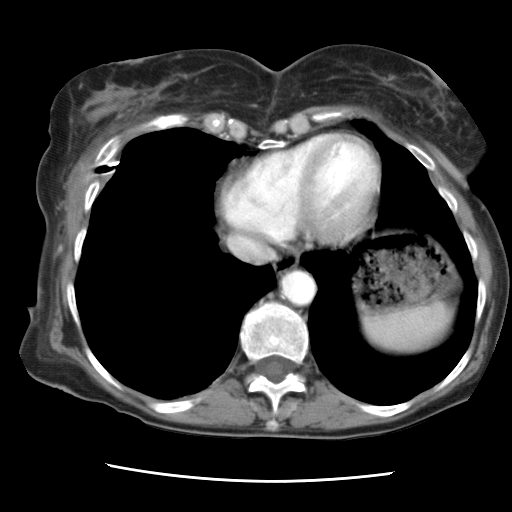
[im 19/62  lung]
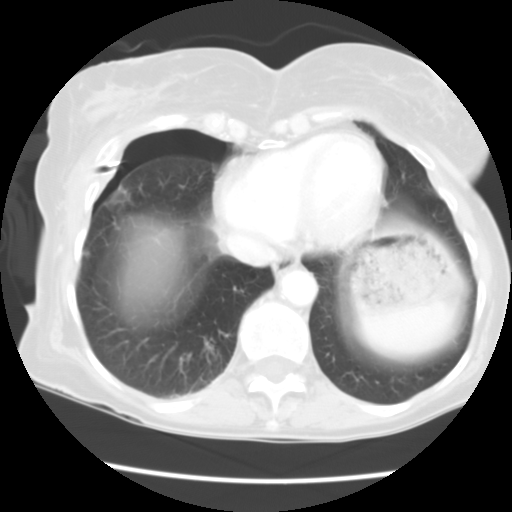
[im 23/62  lung]
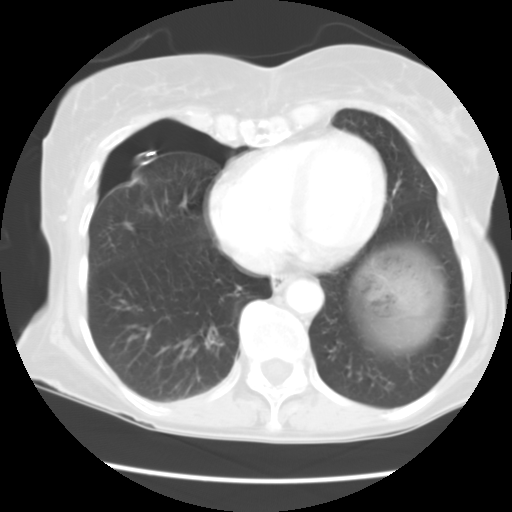
[im 28/62  lung]
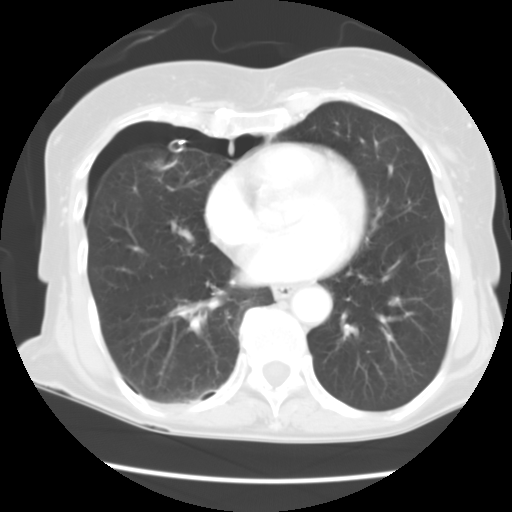
[im 32/62  lung]
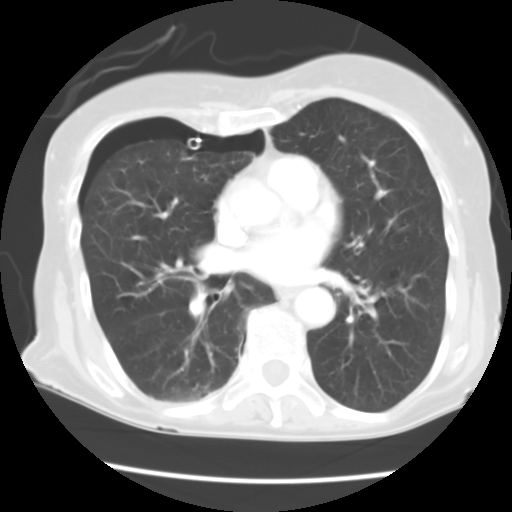
[im 34/62  mediastinal]
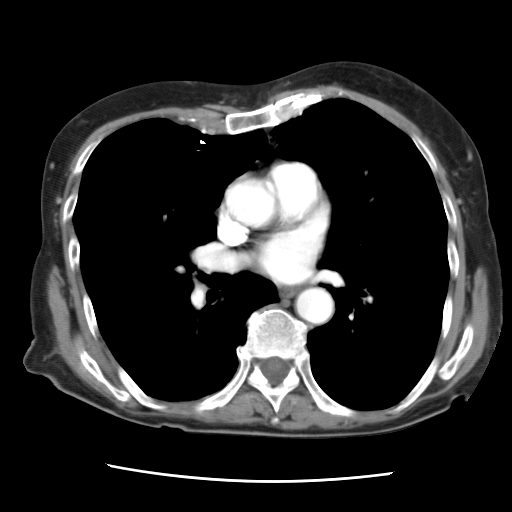
[im 34/62  lung]
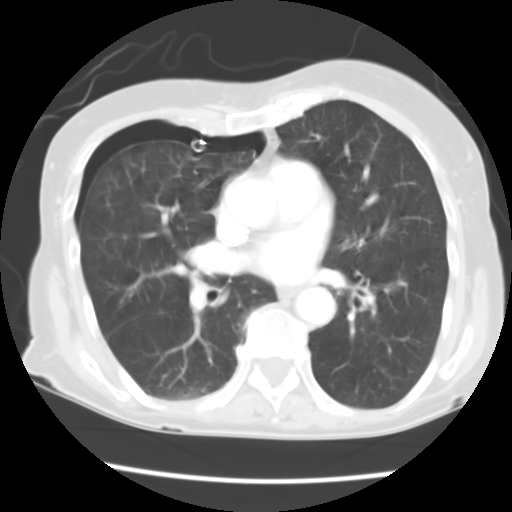
[im 39/62  lung]
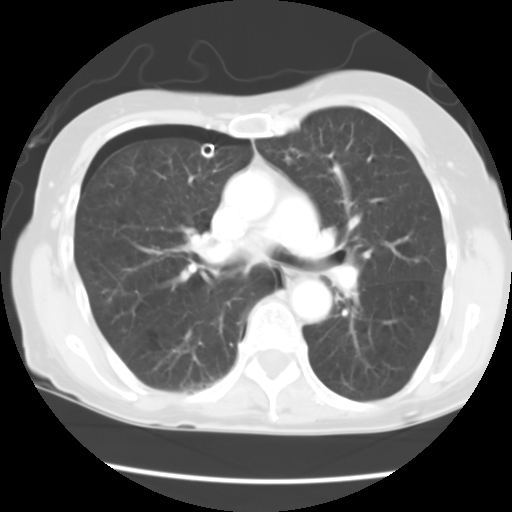
[im 43/62  lung]
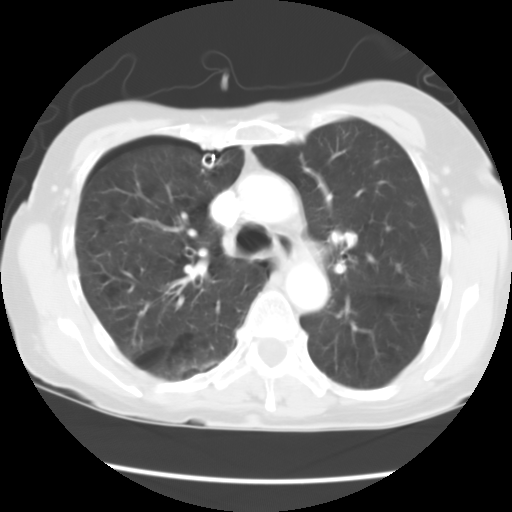
[im 48/62  lung]
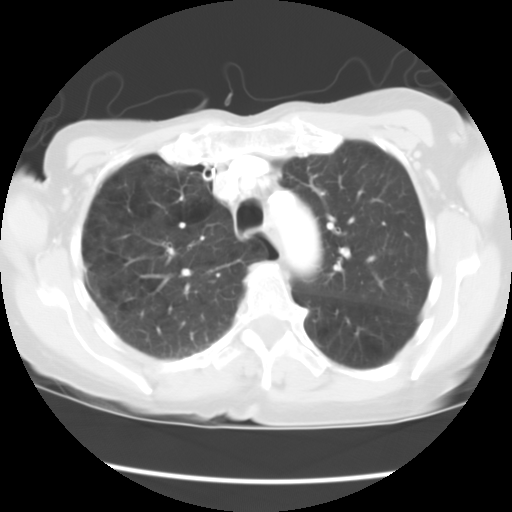
[im 50/62  mediastinal]
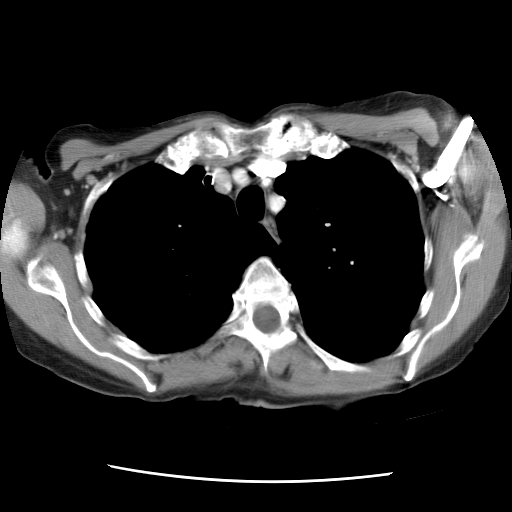
[im 50/62  lung]
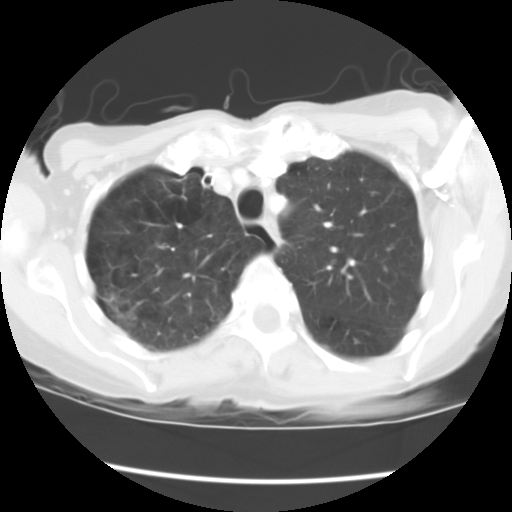
[im 55/62  lung]
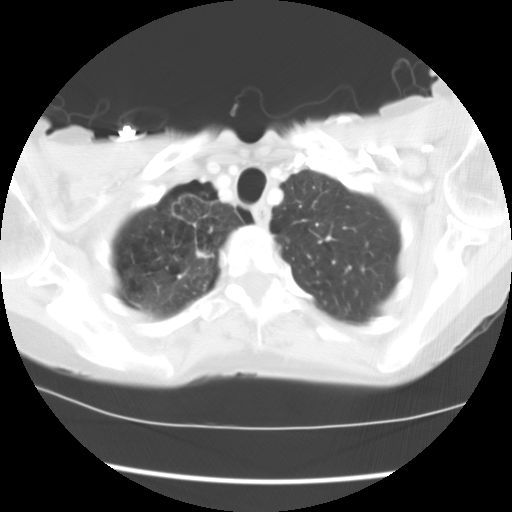
[im 59/62  lung]
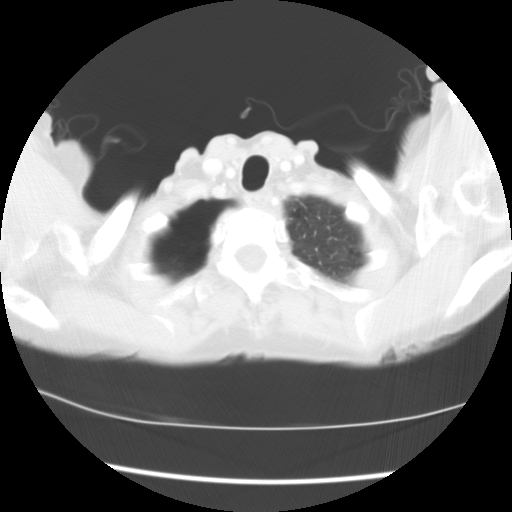

[15 of 31 positions shown; findings below may reference images not displayed]

FINDINGS: A right-sided chest tube has been placed, directed anteriorly and superiorly.  10 percent pneumothorax on the right remains.  Several bullae-blebs (measuring up to 1.1 cm) noted along the right lung apex with tiny bullae/blebs seen along the superior segment of the right lower lobe.  Scattered areas of what most likely represent subsegmental atelectatic changes throughout the right lung superimposed upon mild chronic obstructive pulmonary type changes.  No mediastinal, hilar or axillary adenopathy.  
Atherosclerotic type changes thoracic aorta, great vessels and coronary arteries.  
Status-post cholecystectomy.  Slight prominence of the pancreatic duct measuring 4.8 mm.  The full extent of the pancreas is not included on the present exam. CT abdomen will be necessary for further delineation.  Mild hyperplasia of the left adrenal gland.
IMPRESSION: 1.  Several right apical bullae/bleb with areas of surrounding atelectasis suspected. There are also tiny bullae/bleb along the posterior aspect of the superior segment of the right lower lobe.  
2.  Slight prominence of the pancreatic duct of questionable significance.  The complete extent of the pancreas is not included on the present exam.

## 2006-10-28 IMAGING — CR DG CHEST 1V PORT
1 series · 1 of 1 positions shown · non-contrast
Comparison: 06/15/04.

CLINICAL DATA: Pneumothorax.
 PORTABLE ARSAZ-B VIEW:

[view not recorded]
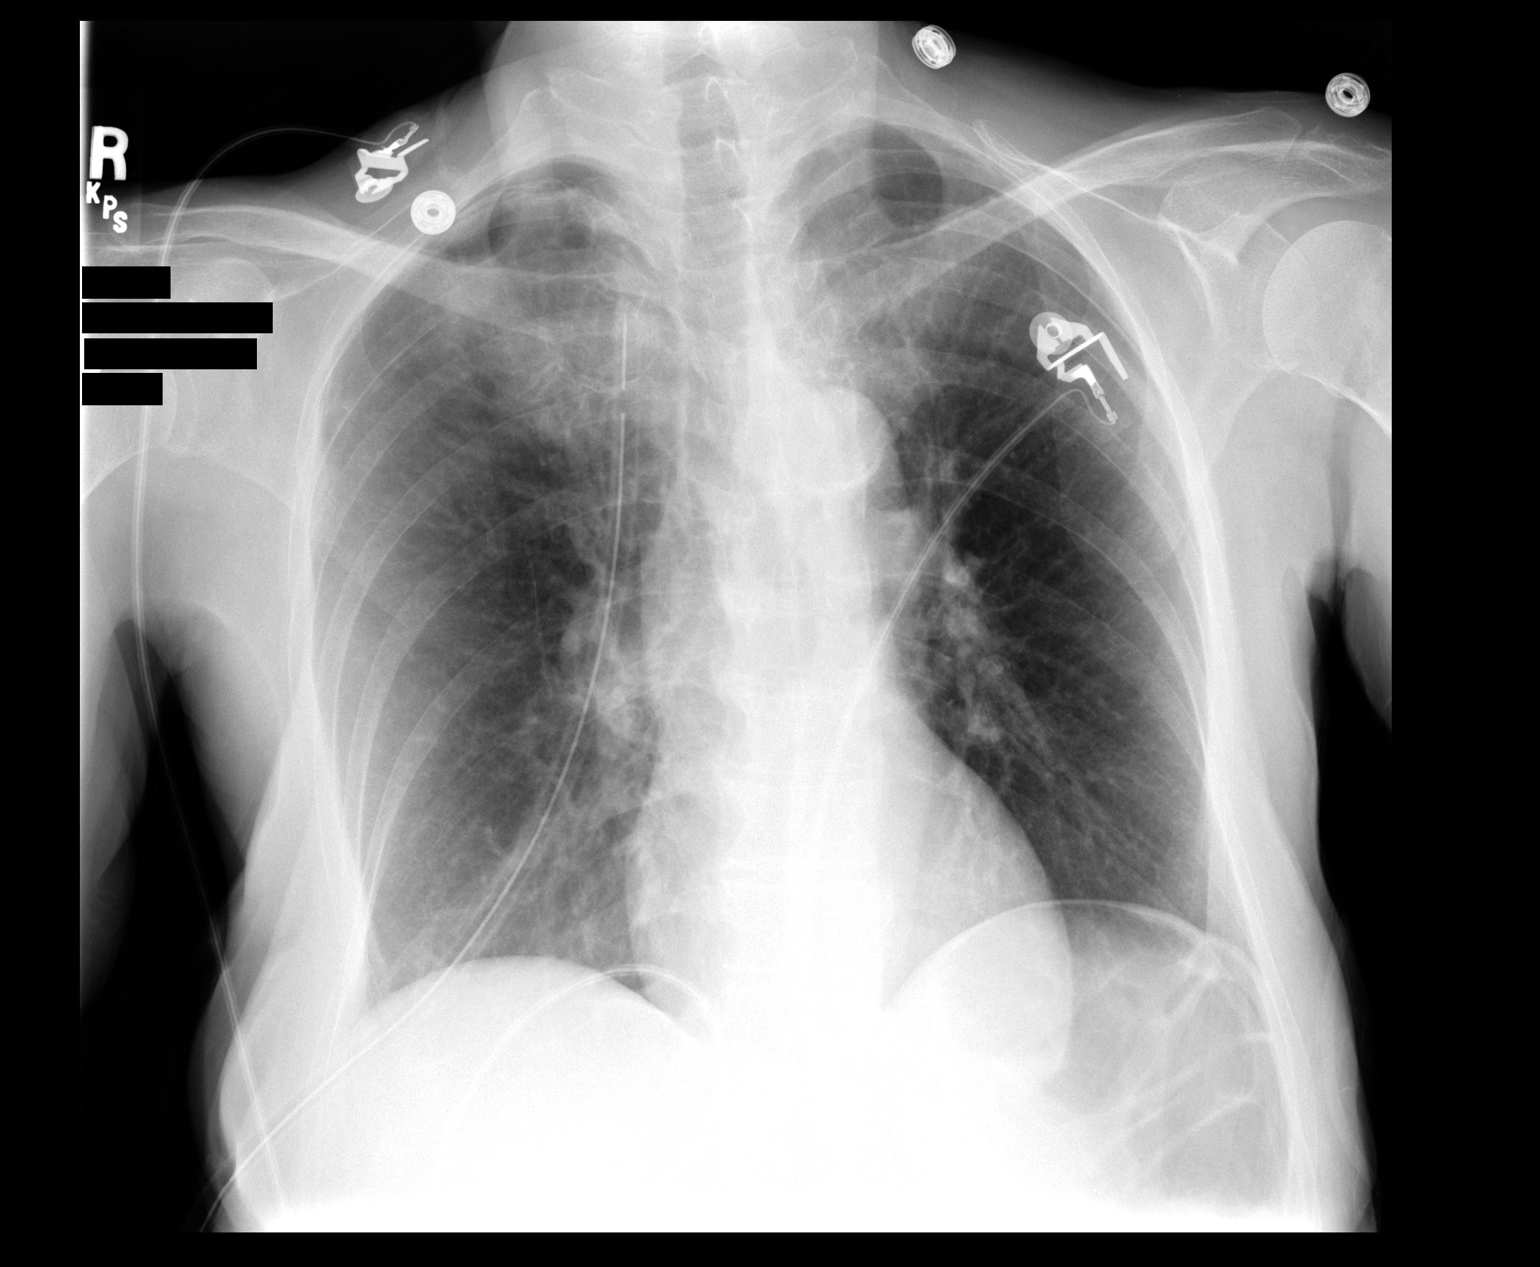

[1 of 1 positions shown; findings below may reference images not displayed]

Right chest tube stable in position.  Less than 5% apical pneumothorax. The heart is normal size.
IMPRESSION: Interval decrease right apical pneumothorax.

## 2006-10-28 IMAGING — CR DG CHEST 1V PORT
1 series · 1 of 1 positions shown · non-contrast
Comparison: none

CLINICAL DATA: Status post central line placement.
 PORTABLE CHEST: 
 AP view of the chest at 2883 hours reveals the right subclavian catheter tip to be satisfactorily positioned in the superior vena cava.   Right chest tube is in position with small right pneumothorax with subacute emphysema.

[view not recorded]
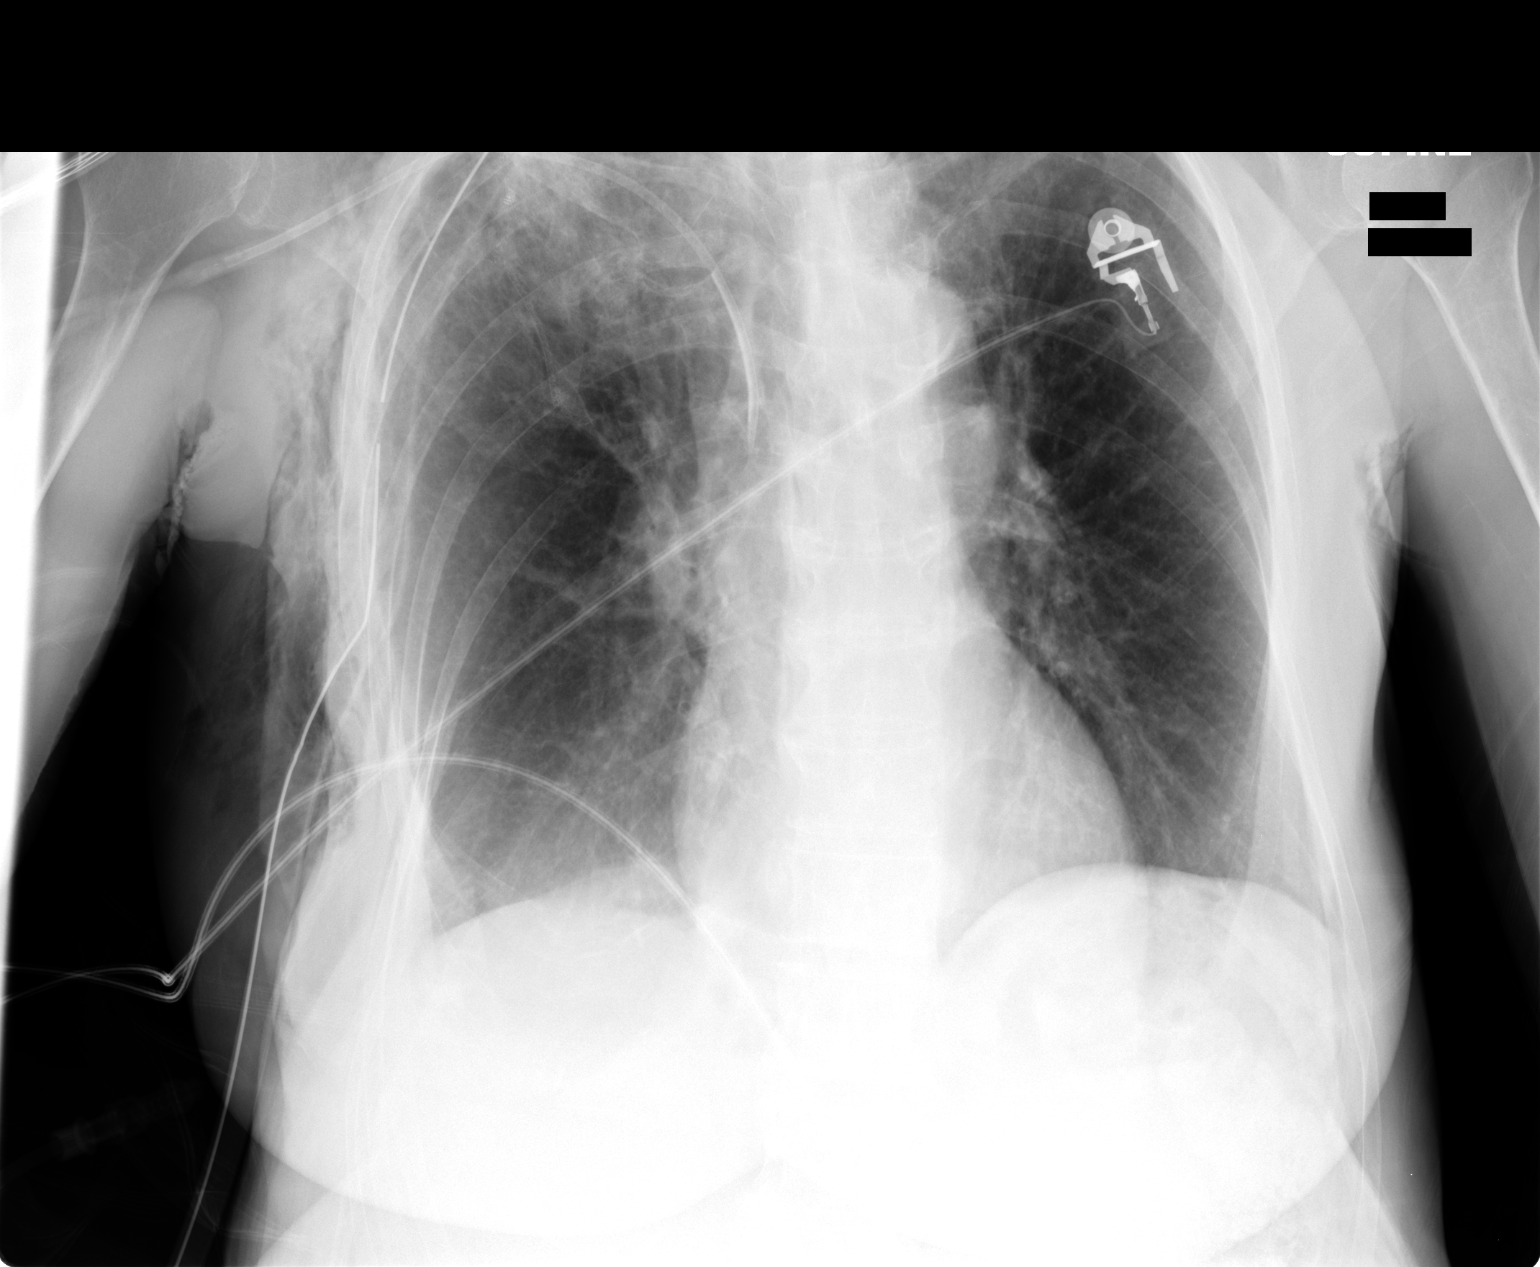

[1 of 1 positions shown; findings below may reference images not displayed]

IMPRESSION: Satisfactory position of the central venous line.

## 2006-10-30 IMAGING — CR DG CHEST 1V PORT
1 series · 1 of 1 positions shown · non-contrast
Comparison: none

CLINICAL DATA: Pneumothorax.
PORTABLE SEMI-ERECT CHEST ? 06/18/04 ? [DATE]:
Comparison examination 06/18/03 [DATE].

[view not recorded]
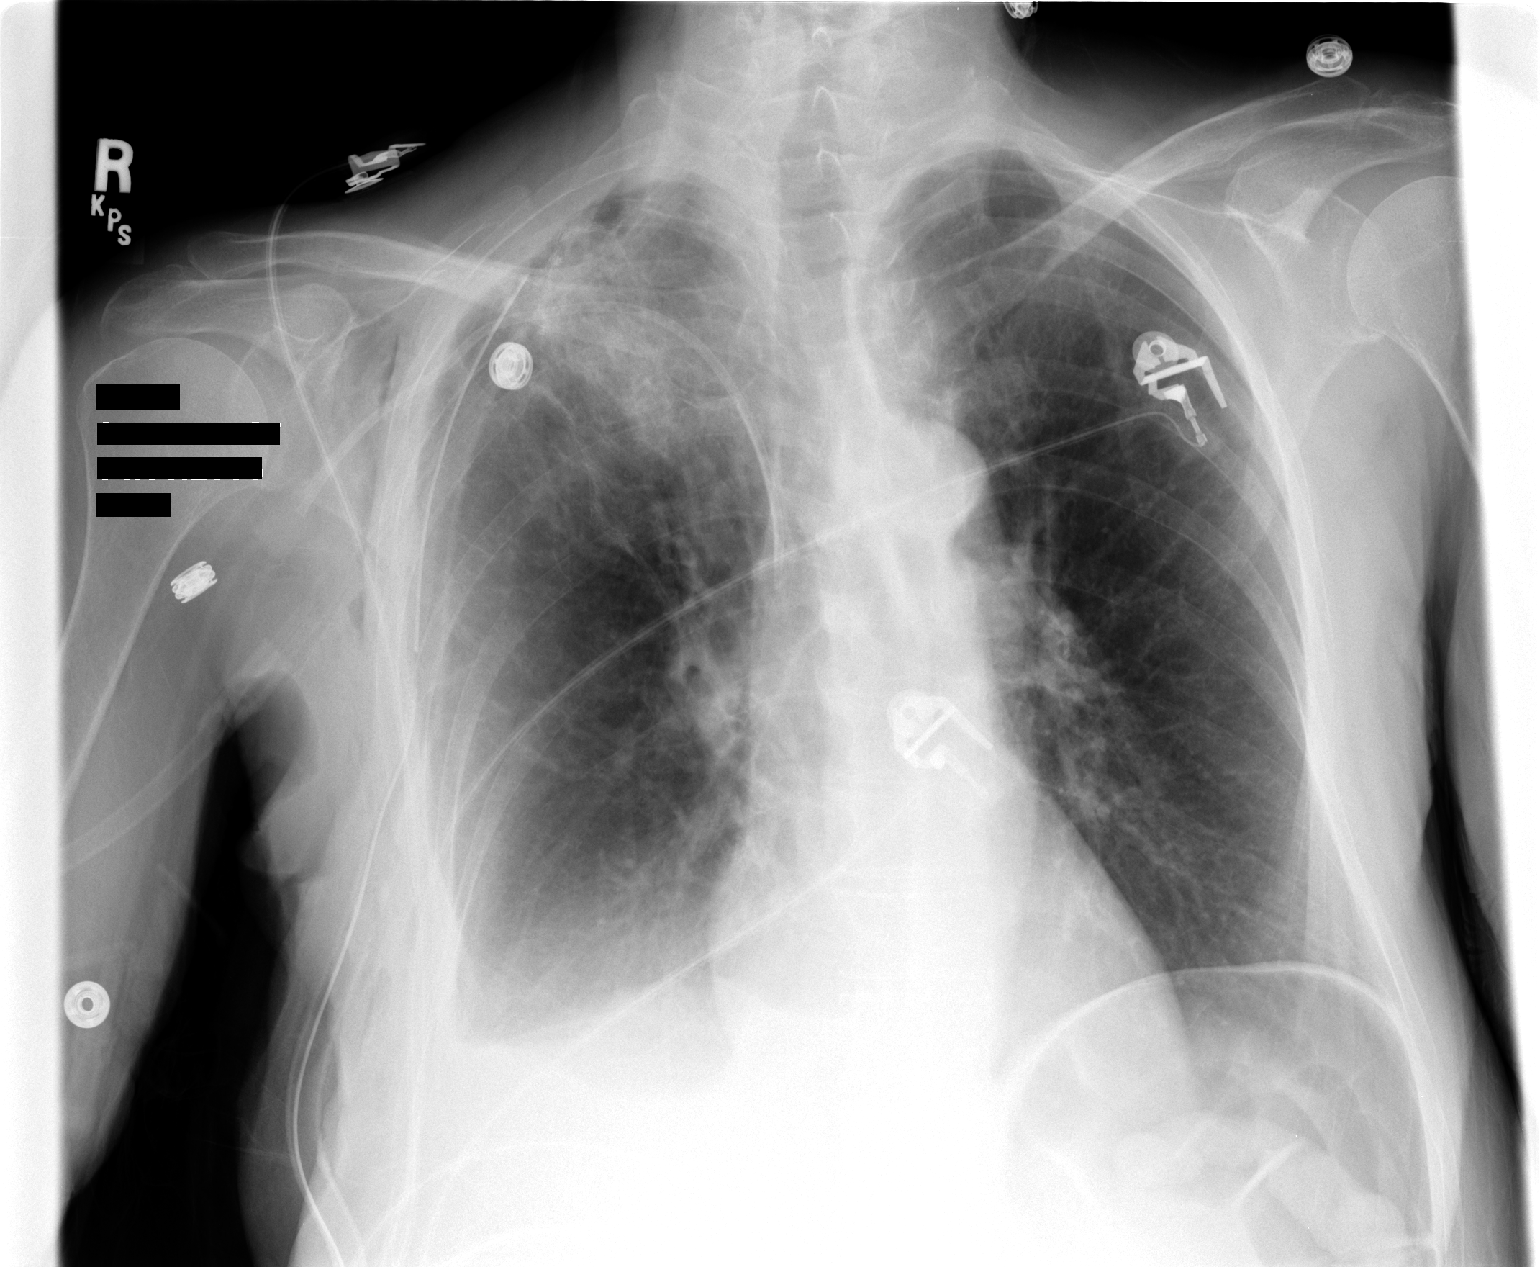

[1 of 1 positions shown; findings below may reference images not displayed]

FINDINGS: Right sided chest tube remains in place.  Very small right apical pneumothorax remains.  Opacification of parenchyma within the right upper lung zone may be related to recent VATS procedure and associated edema.  Right side chest tube and right central line unchanged in position.  Hazy opacity right lung base unchanged.
IMPRESSION: Very small right apical pneumothorax remains as well as right side subcutaneous emphysema.  Patient appears to have undergone a VATS procedure with hazy opacity involving the right upper lung parenchyma with associated surgical staples.

## 2006-10-31 IMAGING — CR DG CHEST 1V PORT
1 series · 1 of 1 positions shown · non-contrast
Comparison: none

CLINICAL DATA: Pneumothorax.  Chest tube removal. 
 PORTABLE CHEST (1 VIEW) ? 1288 HOURS:
 Comparison earlier the same day. 
 The right chest tube has been removed.  There has been no increase in a tiny amount of pleural air on the right.  Parenchymal densities persist in the right upper chest.   Central line remains well positioned. The left chest is clear.

[view not recorded]
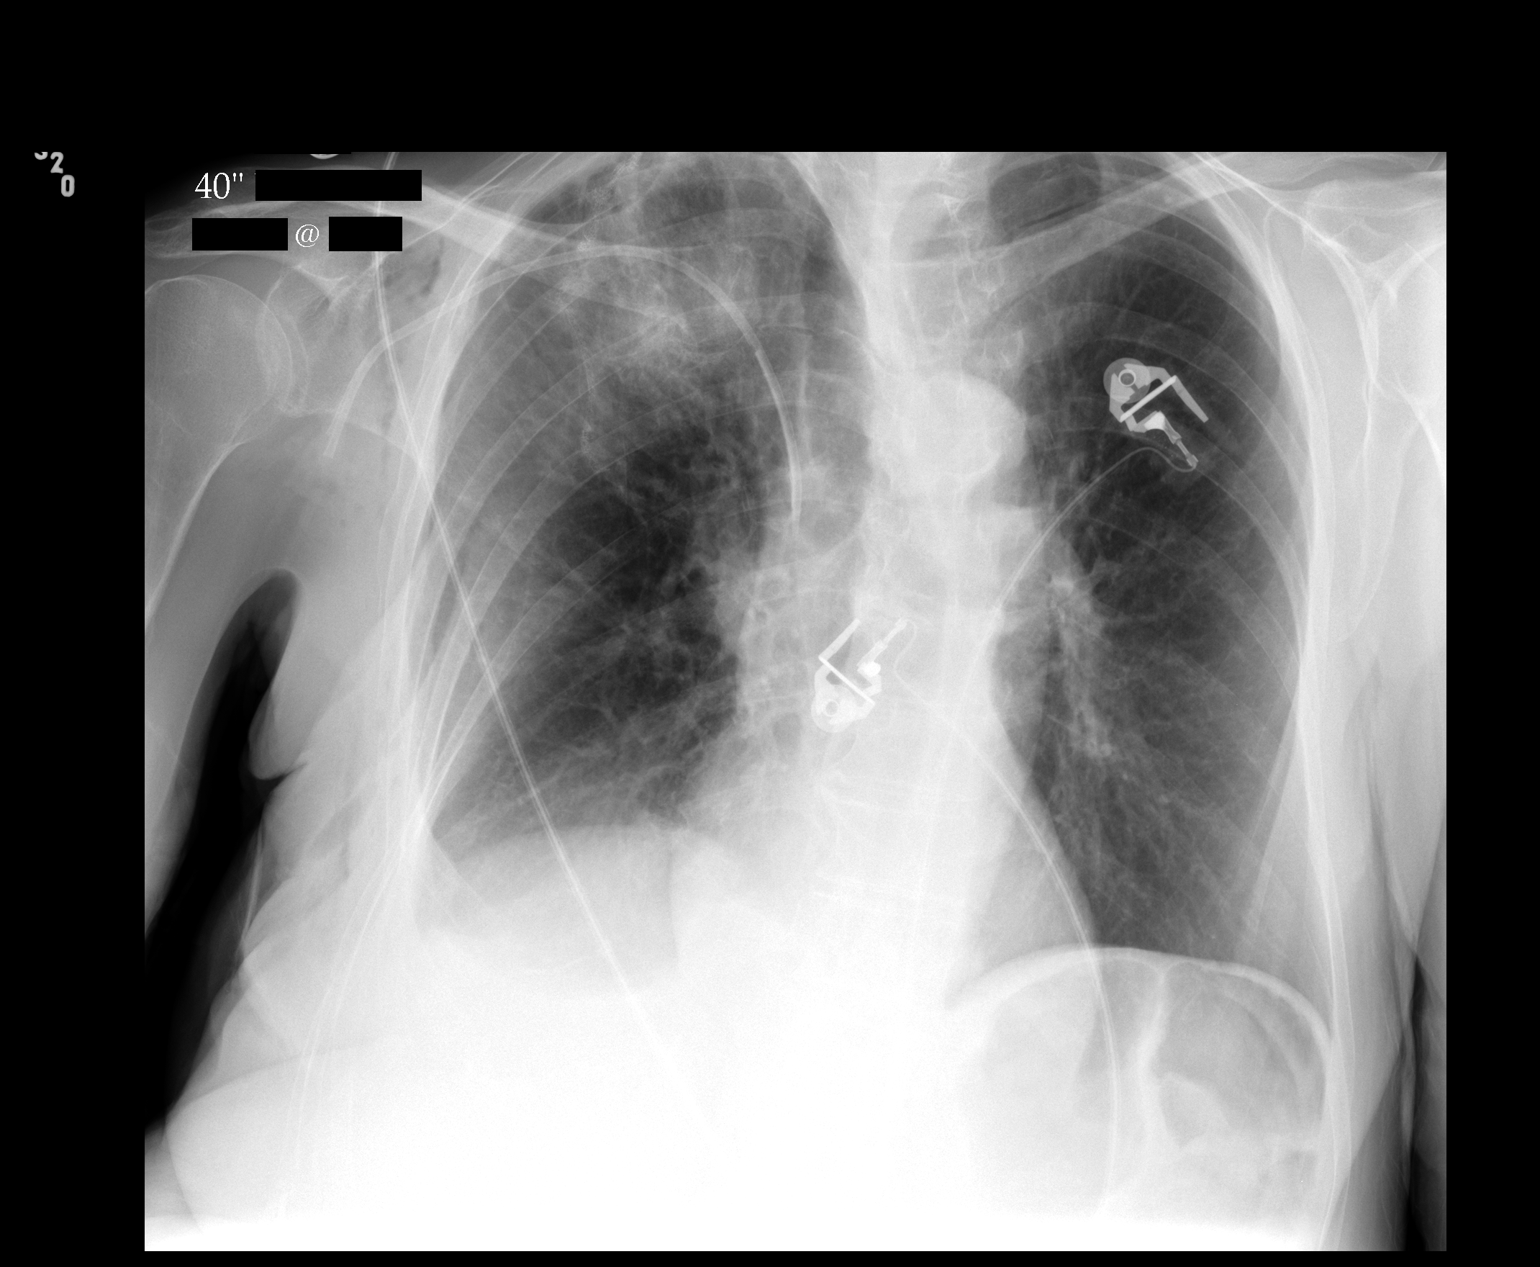

[1 of 1 positions shown; findings below may reference images not displayed]

IMPRESSION: No increase in pneumothorax post chest tube removal.

## 2006-10-31 IMAGING — CR DG CHEST 2V
2 series · 2 of 2 positions shown · non-contrast
Comparison: 06/18/2004.

CLINICAL DATA: Followup right pneumothorax.

CHEST - 2 VIEW

[w chest pa]
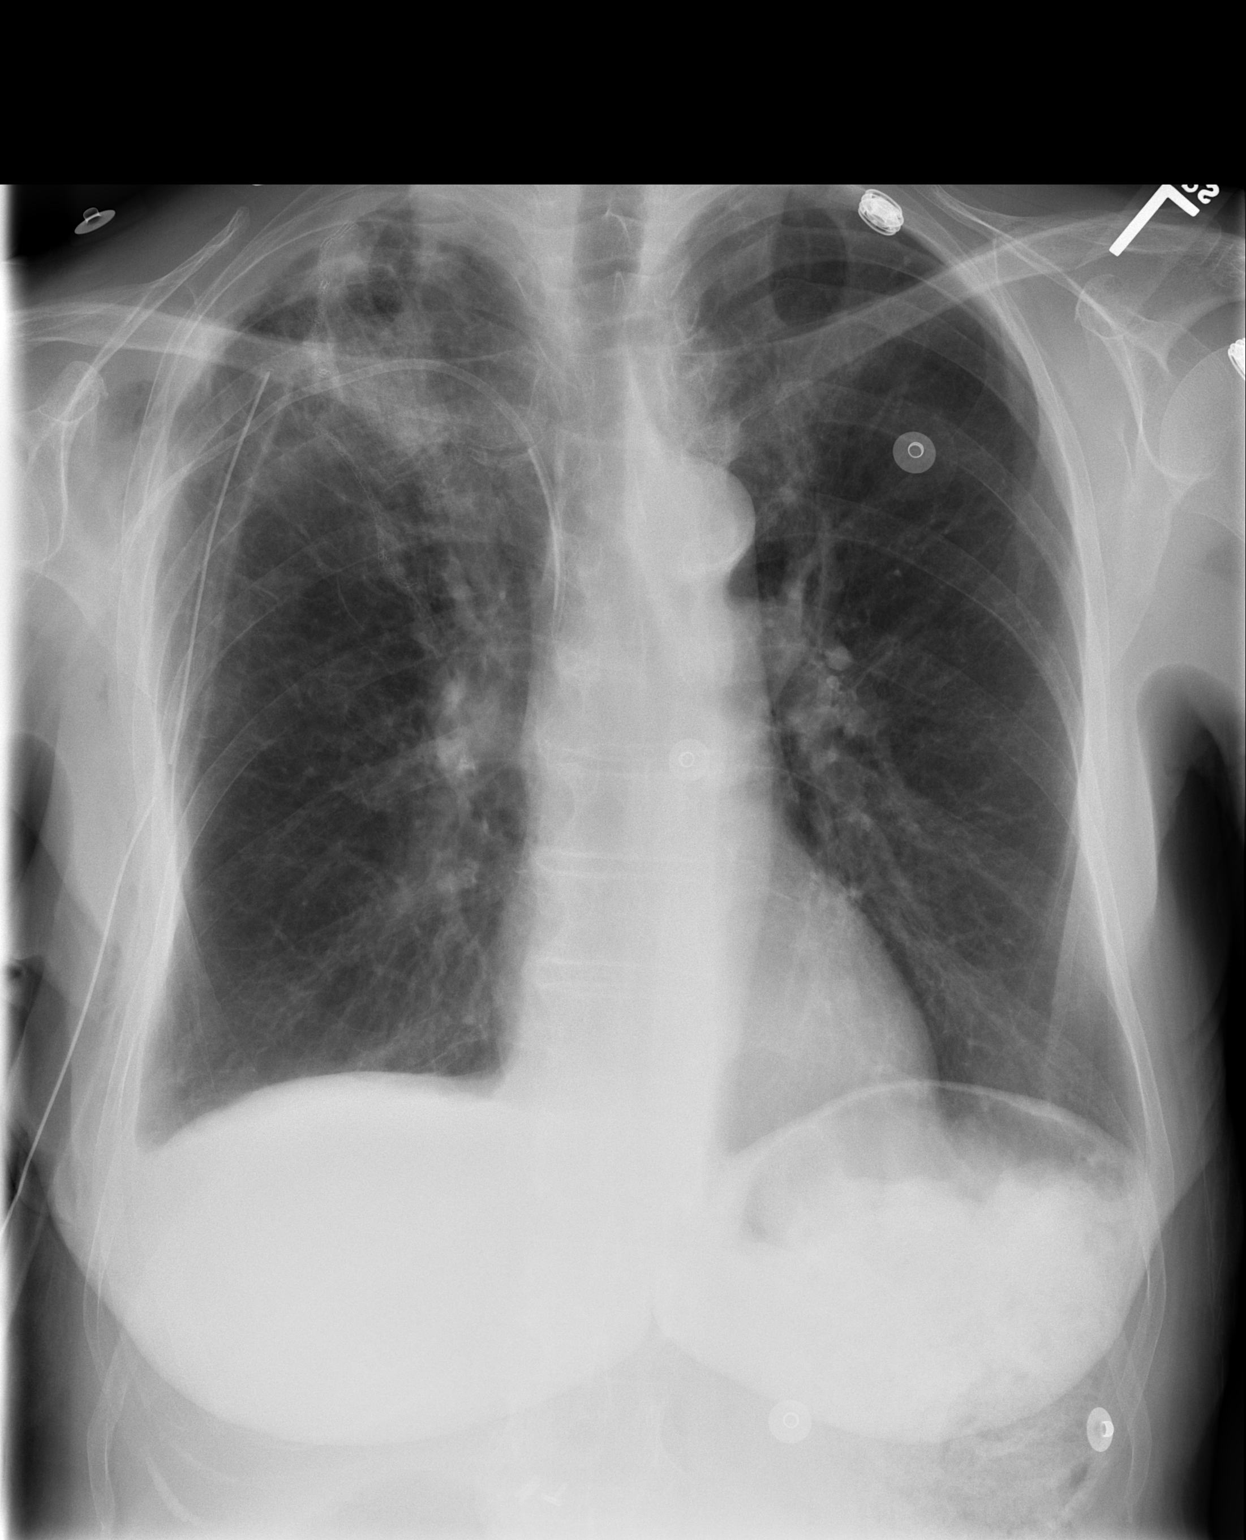

[w chest lat]
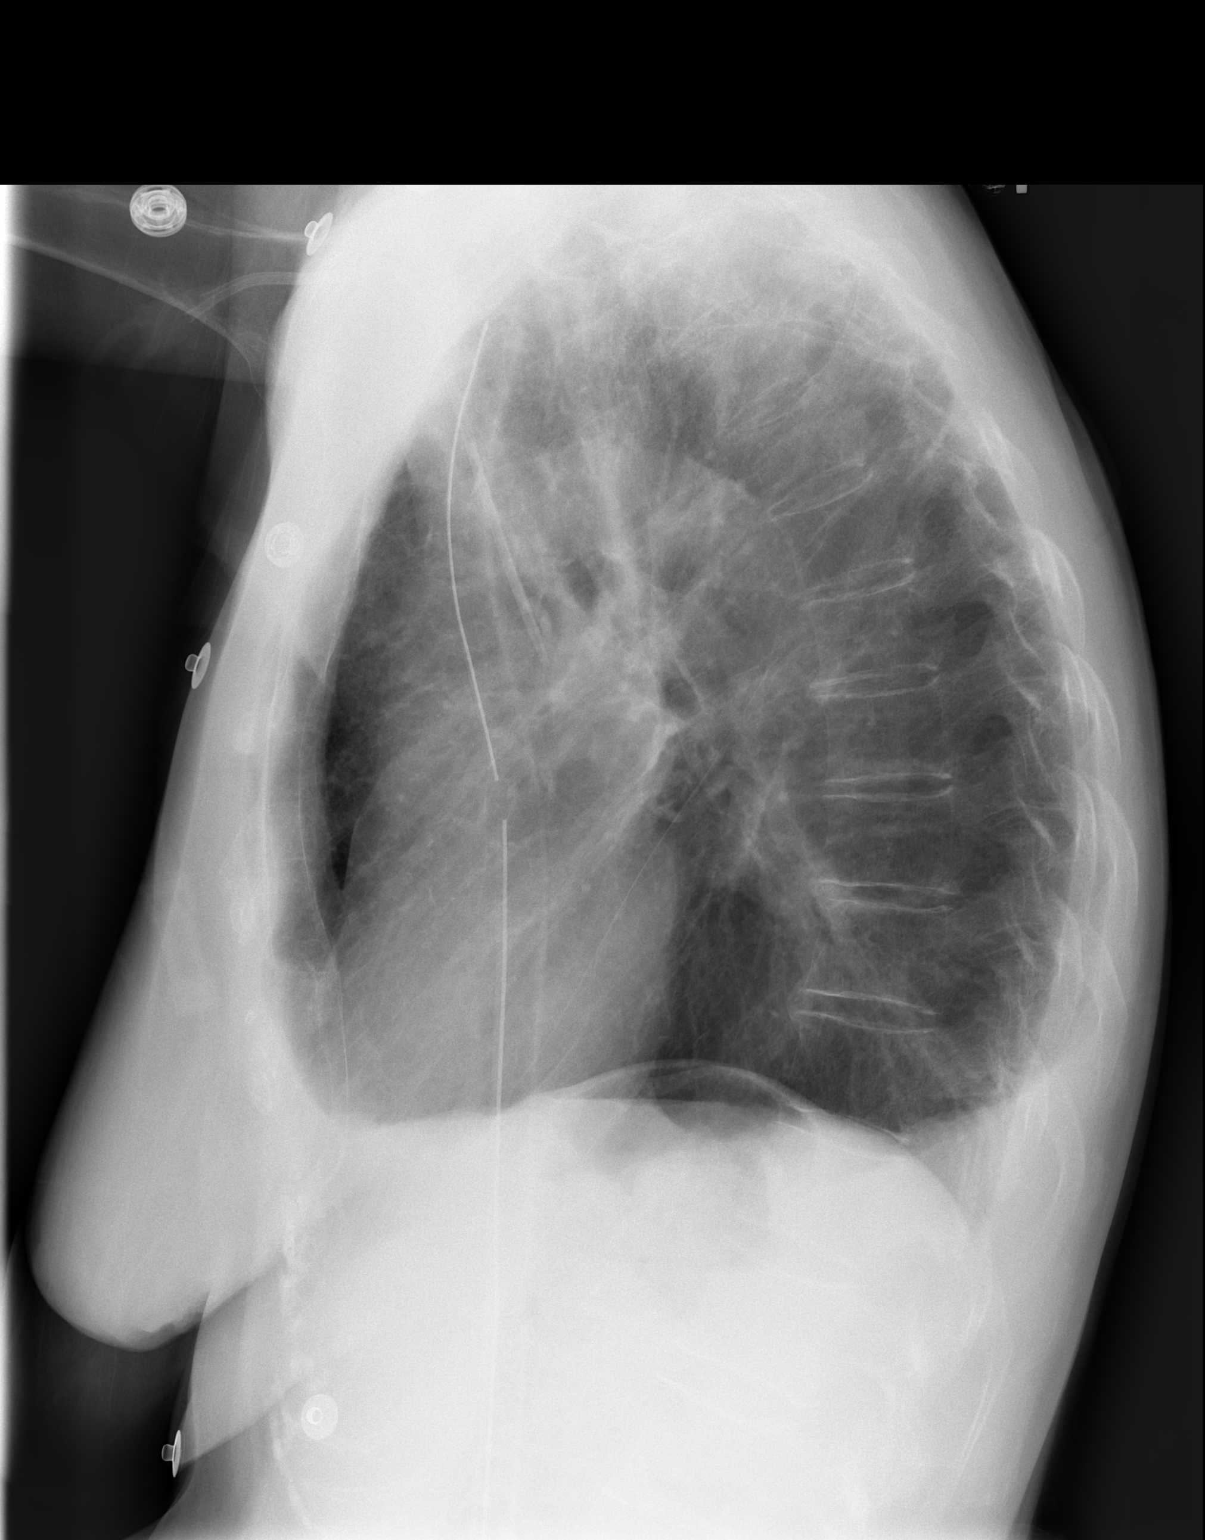

[2 of 2 positions shown; findings below may reference images not displayed]

FINDINGS: Less than 5% right apical loculated hydropneumothorax. Stable right
chest tube and right apical and right upper lobe surgical staples. Stable right
subclavian catheter. Improved inspiration with changes of COPD. Normal sized
heart. Small right pleural effusion. Diffuse osteopenia.

IMPRESSION

1. Less than 5% right apical loculated hydropneumothorax.

2. Small right pleural effusion.

3. COPD.

## 2006-11-01 IMAGING — CR DG CHEST 2V
2 series · 2 of 2 positions shown · non-contrast
Comparison: 06/19/04.

CLINICAL DATA: Productive cough.  Follow-up right pneumothorax.  Shortness of breath.
 CHEST ? 2 VIEW:

[view not recorded (1 of 2)]
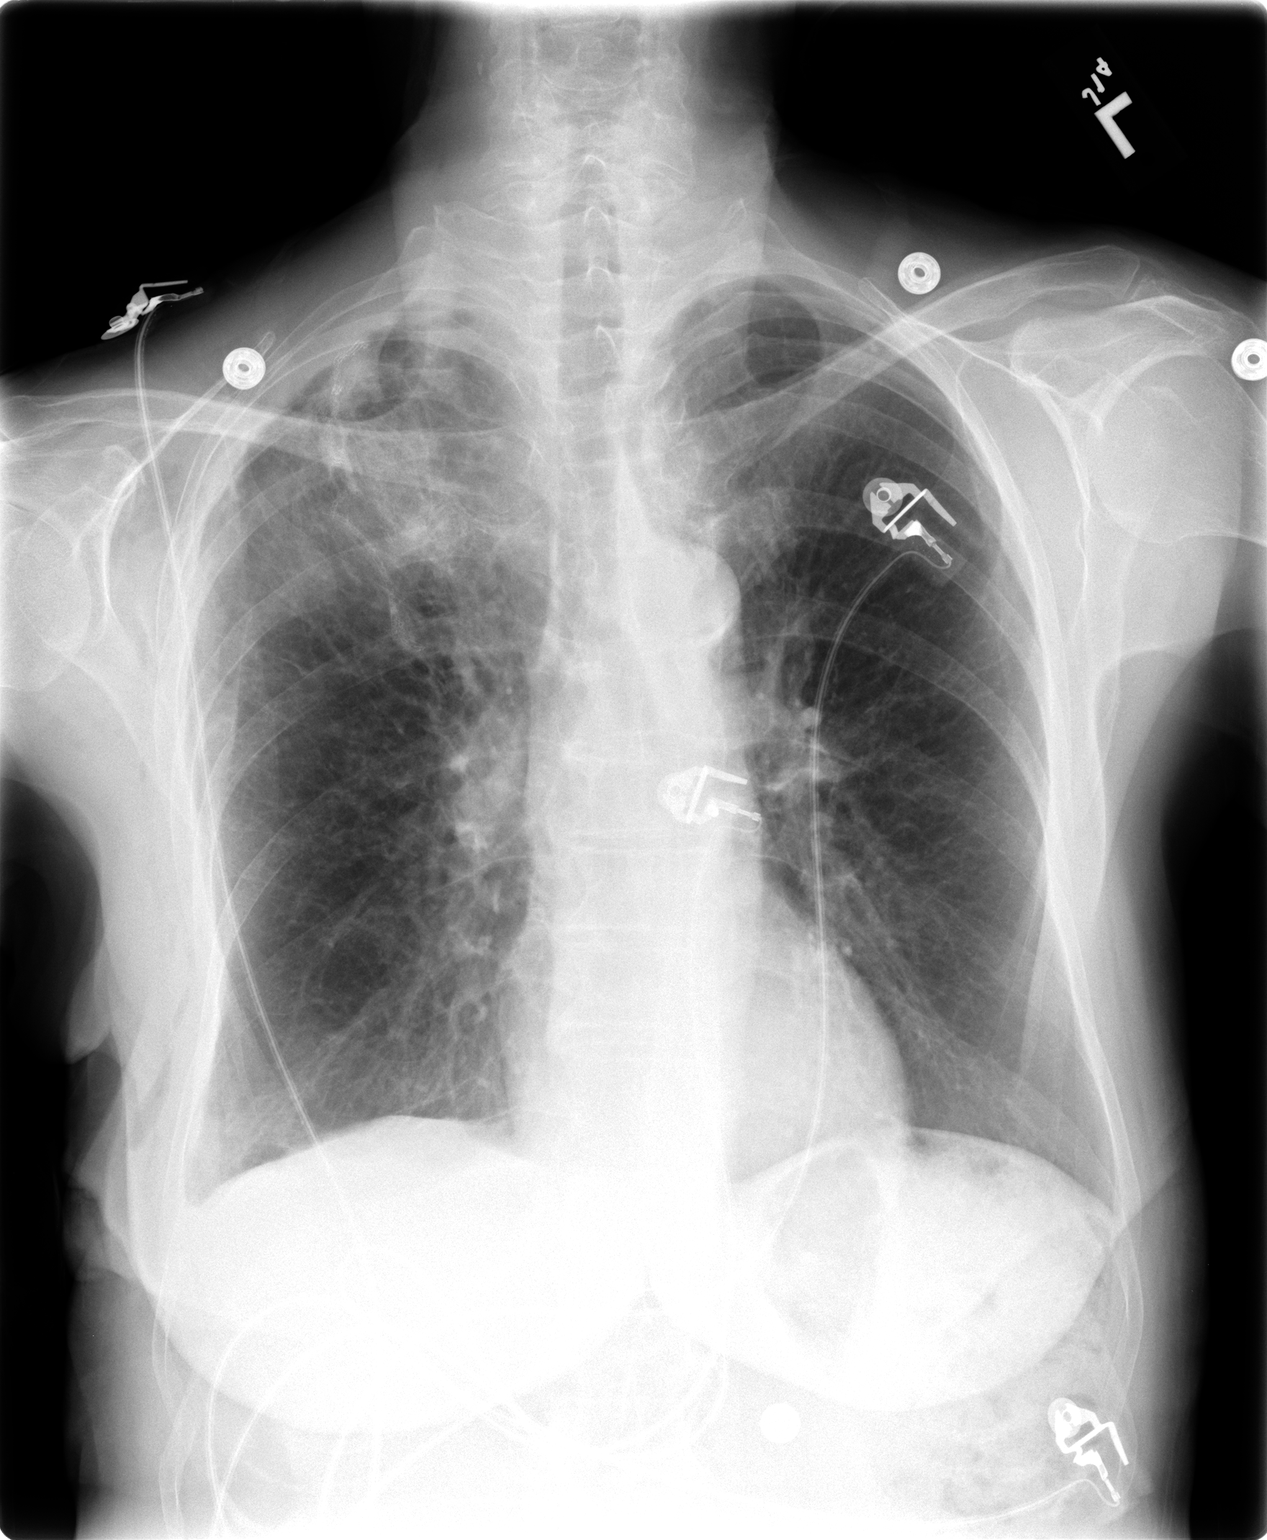

[view not recorded (2 of 2)]
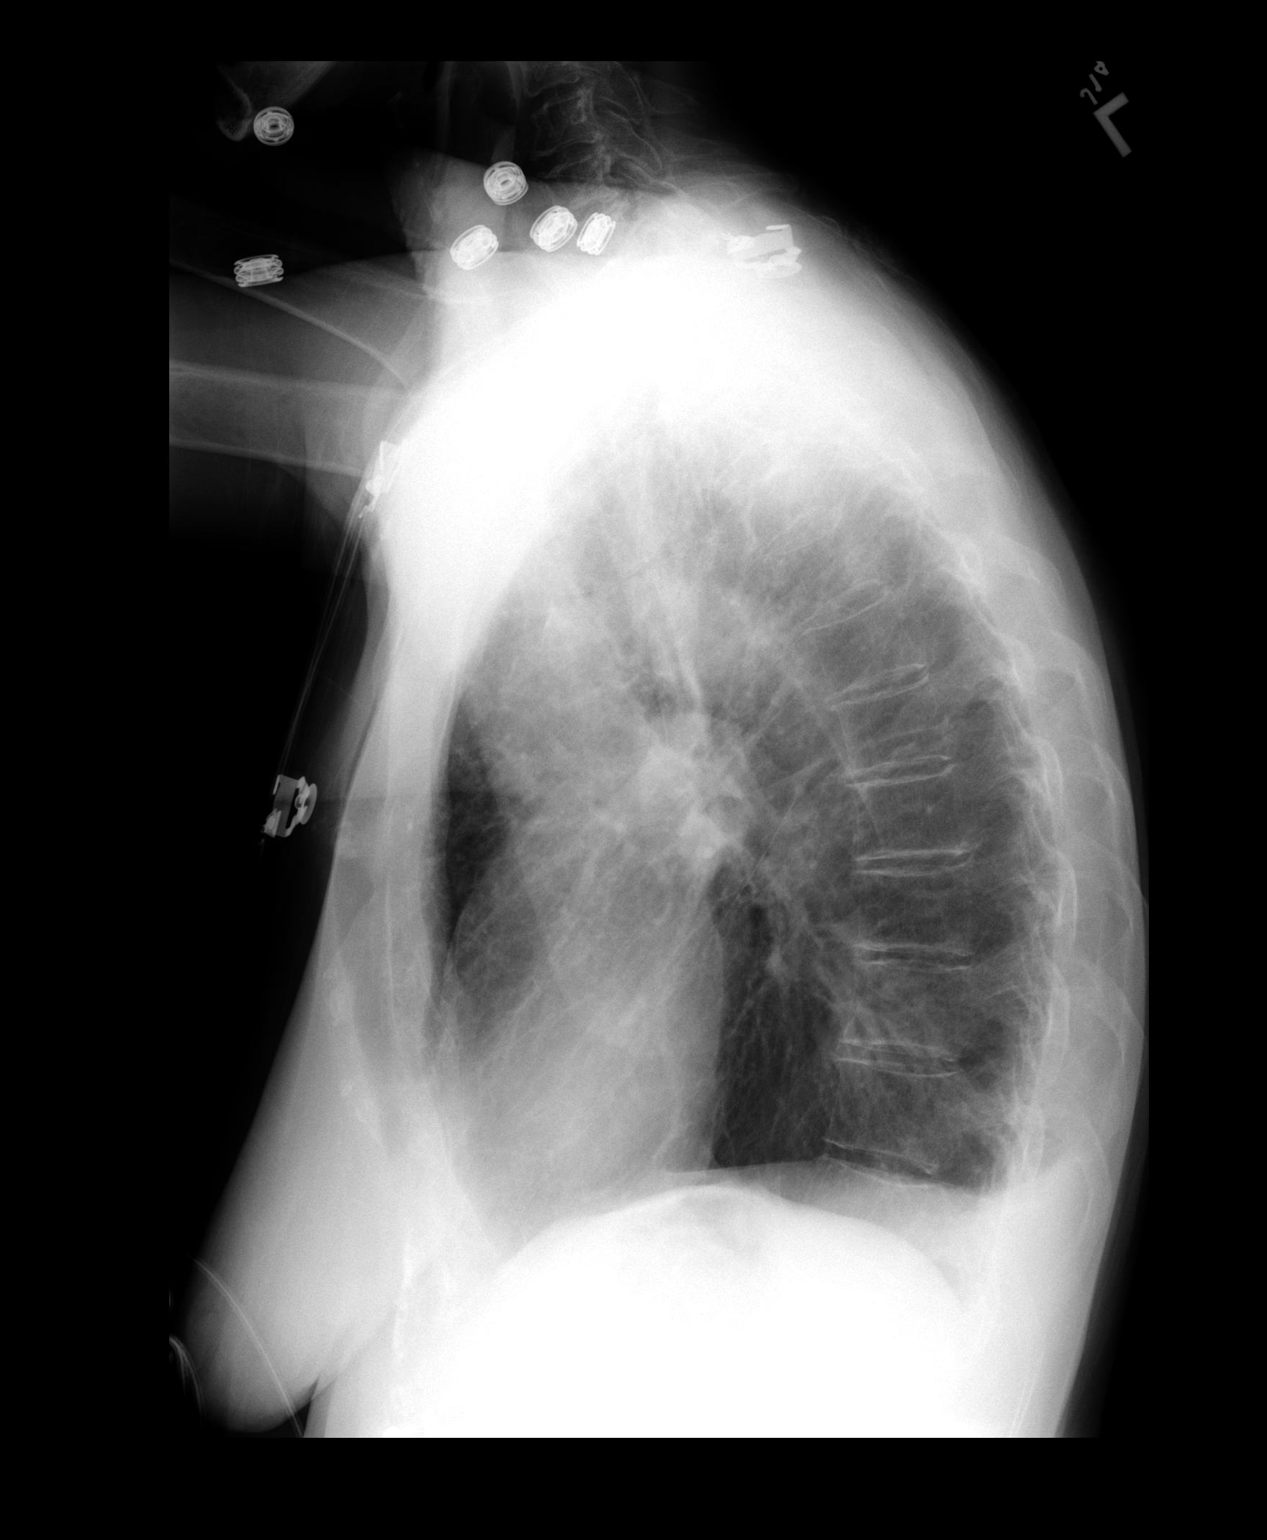

[2 of 2 positions shown; findings below may reference images not displayed]

Surgical staples are again seen at the right lung apex with associated right upper lobe opacity which may be due to contusion or atelectasis.  A tiny approximately 1 percent right apical pneumothorax persists.
 The left lung is clear.  Heart size and mediastinal contours are normal.  A tiny right pleural effusion is again noted.
IMPRESSION: 1.  Stable tiny approximately 1 percent right apical pneumothorax and minimal right pleural effusion.
 2.  Stable right upper lobe atelectasis versus contusion.

## 2006-11-14 IMAGING — CR DG CHEST 2V
2 series · 2 of 2 positions shown · non-contrast
Comparison: none

CLINICAL DATA: Pneumothorax.  Follow-up.
 CHEST ? TWO VIEWS:
 Two views of the chest are compared to a chest x-ray of 06/20/04.  No residual pneumothorax is seen.  The lungs are hyperaerated consistent with COPD.
 Sutures are noted in the right apex and right upper lobe.  The heart is within normal limits in size.  No bony abnormality is seen.

[w chest pa]
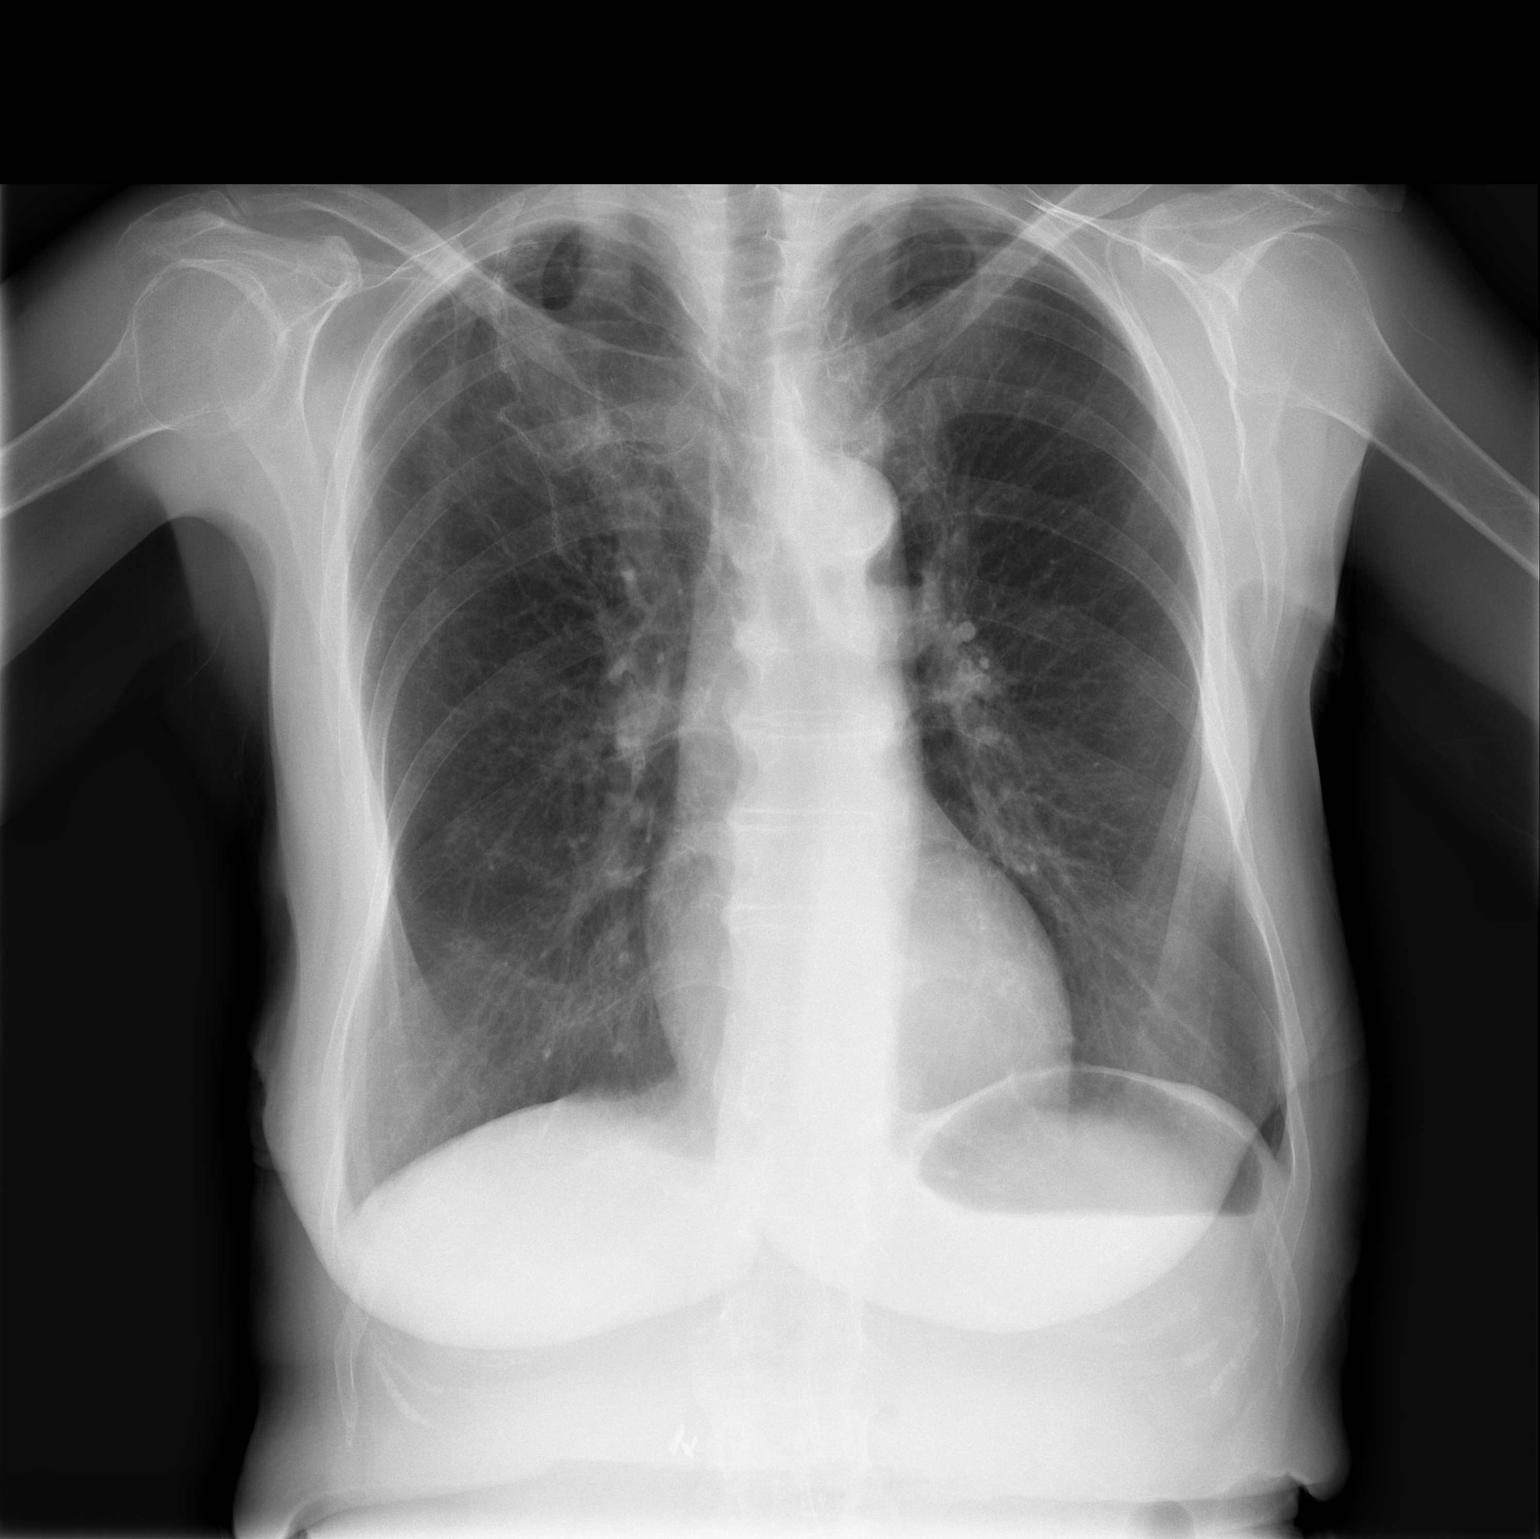

[w chest lat]
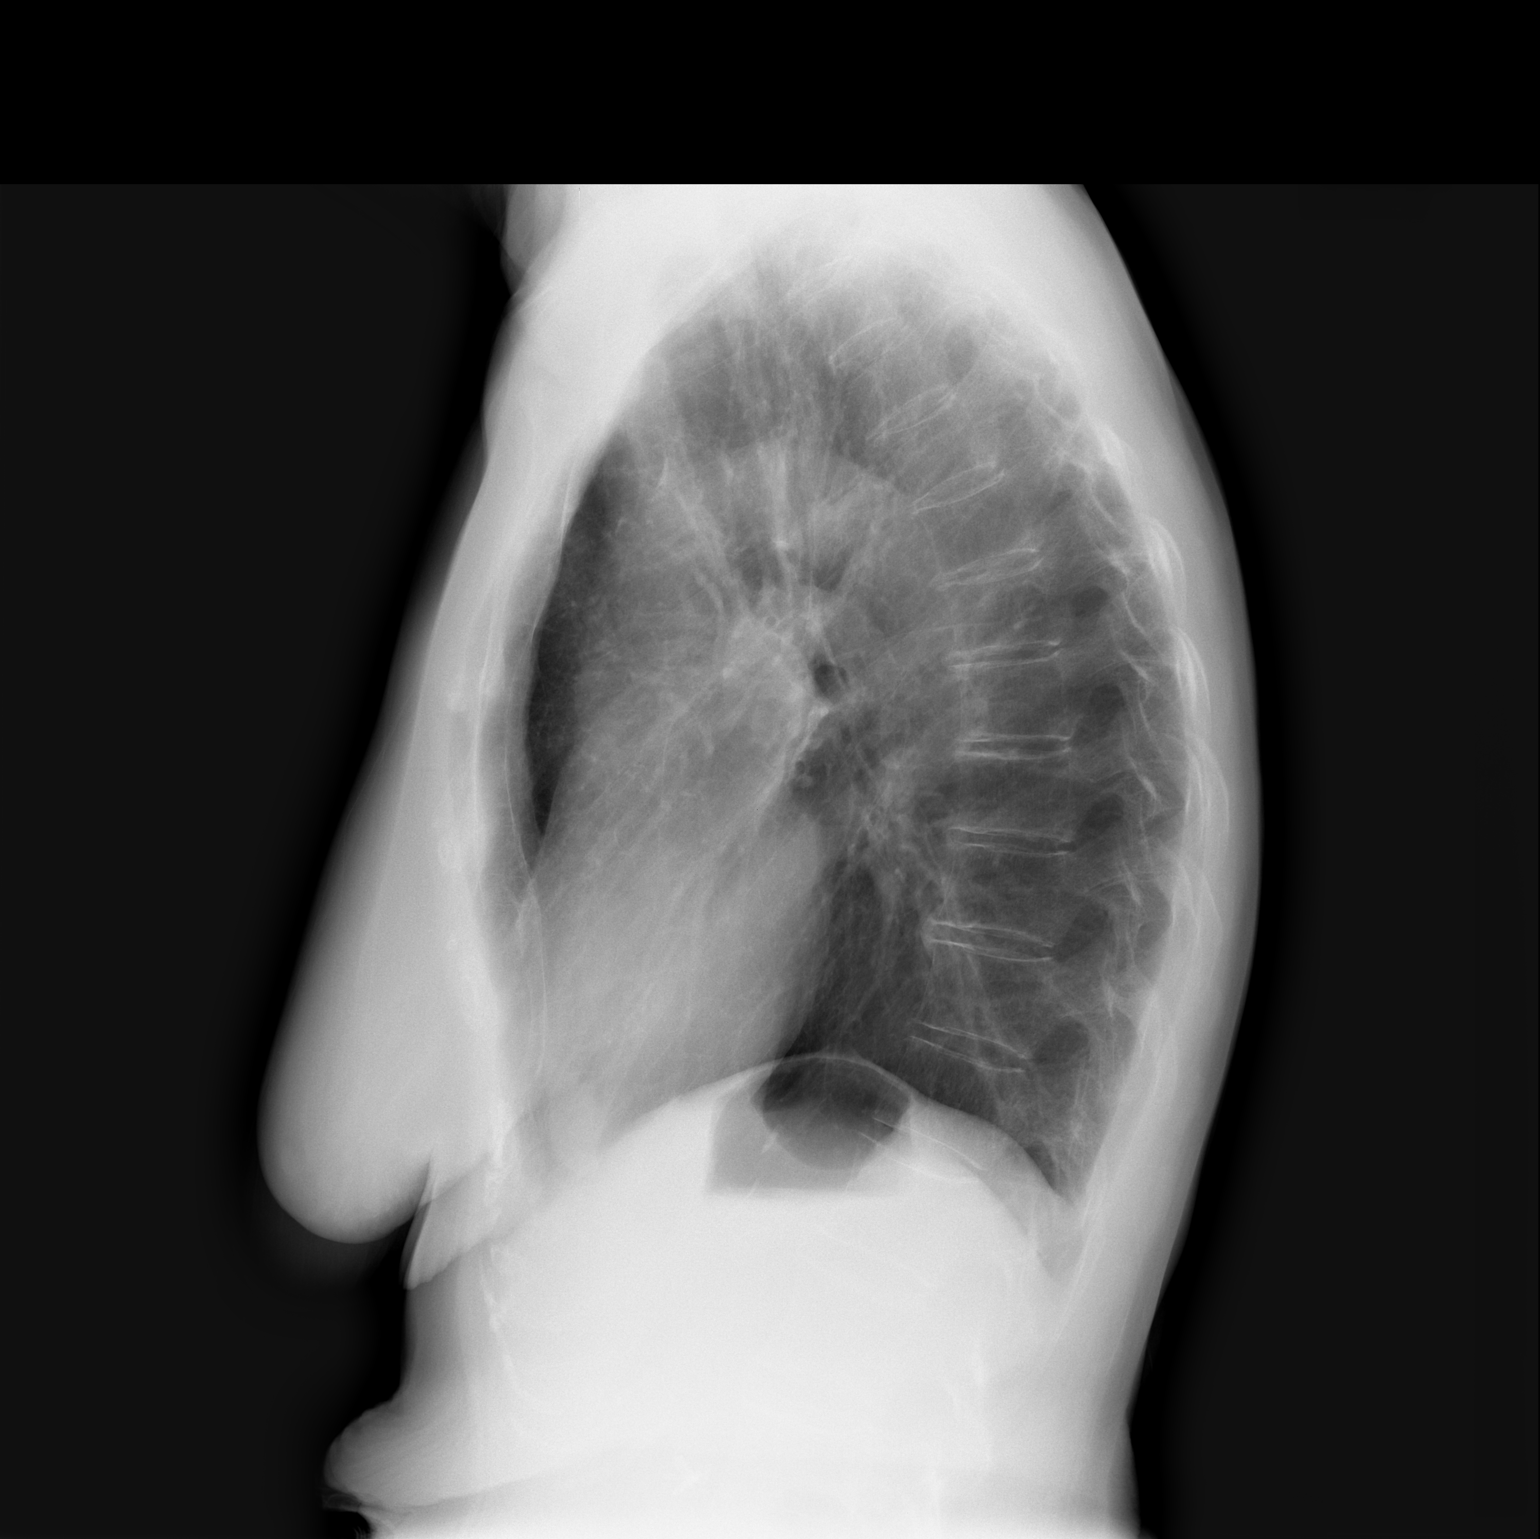

[2 of 2 positions shown; findings below may reference images not displayed]

IMPRESSION: No residual pneumothorax is seen.   No active lung disease.

## 2007-04-05 ENCOUNTER — Encounter: Admission: RE | Admit: 2007-04-05 | Discharge: 2007-04-05 | Payer: Self-pay | Admitting: Family Medicine

## 2008-10-24 IMAGING — CR DG SHOULDER 2+V*R*
1 series · 1 of 1 positions shown · non-contrast
Comparison: none

Clinical: Fell

Right femur 2 view:
Spiral fracture through the mid diaphysis of the femur, with approximately 2 cm
override of the major fracture fragments, anterior displacement and mild
anterior angulation of the distal shaft fragment. No extension to the articular
surface.

[view not recorded]
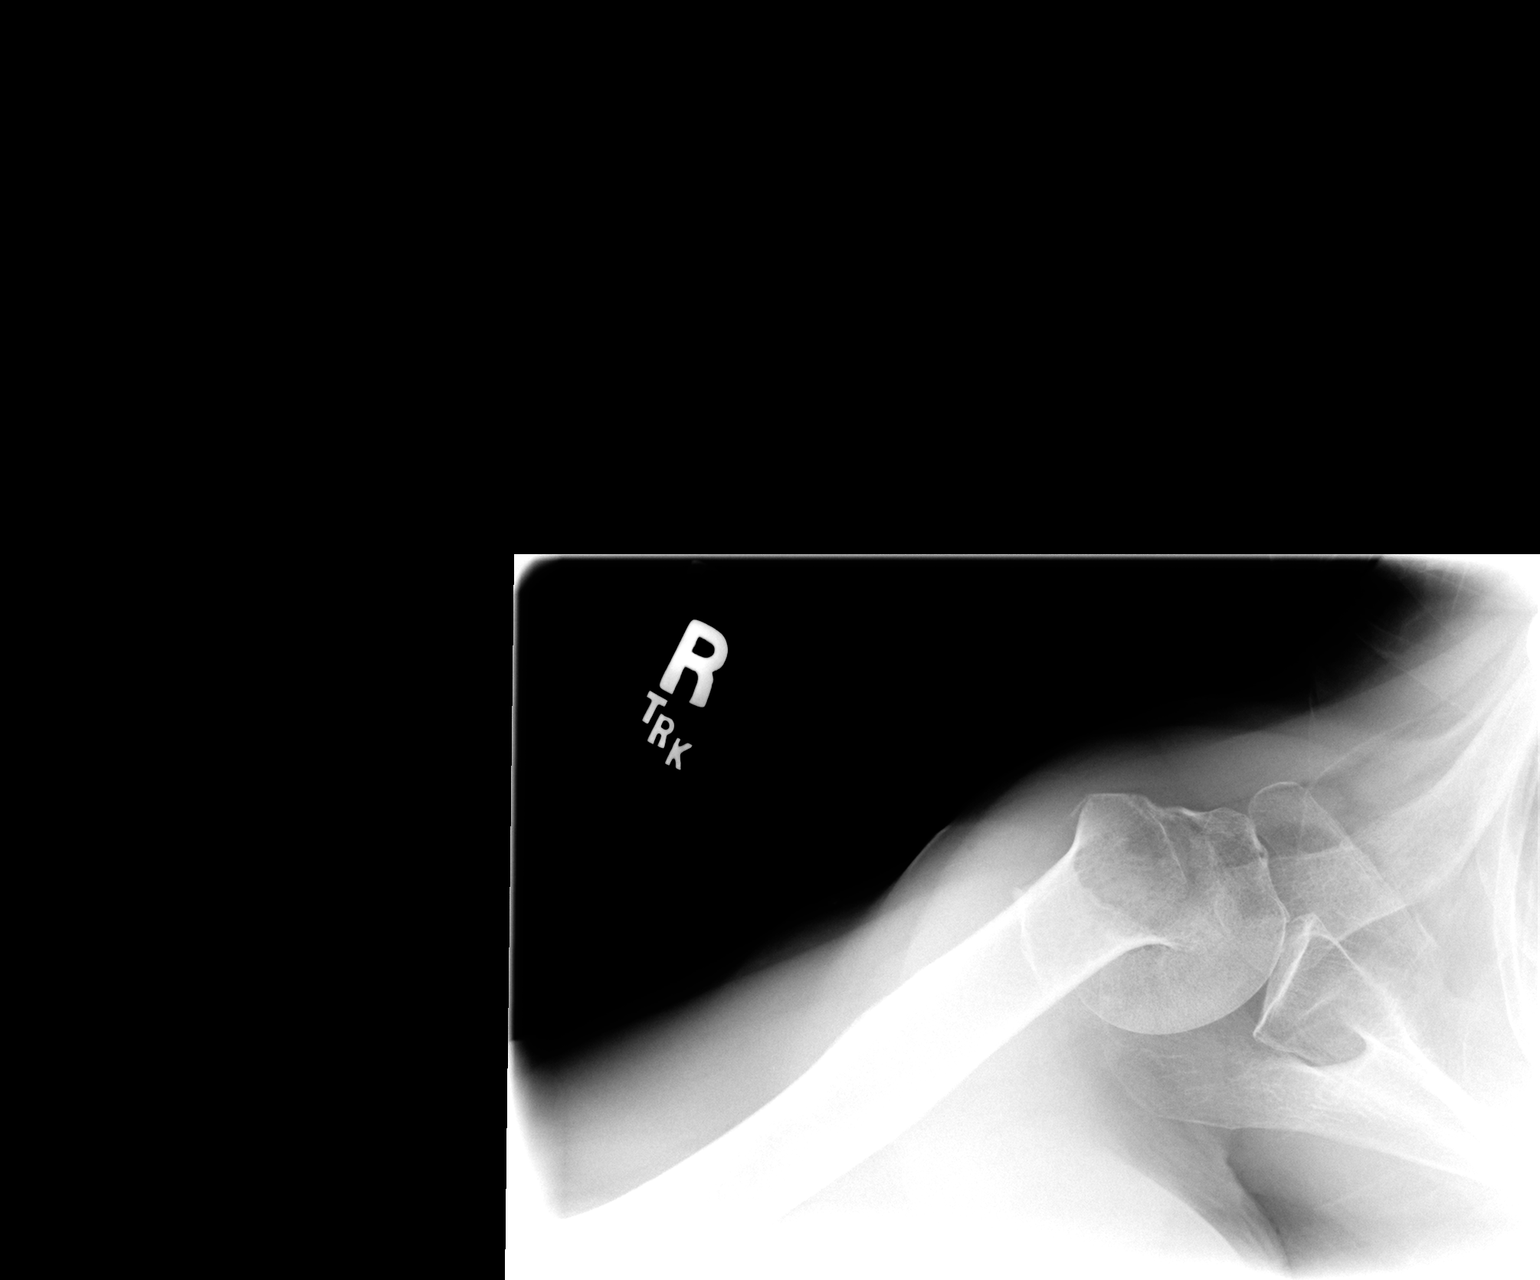

[1 of 1 positions shown; findings below may reference images not displayed]

IMPRESSION: 1. Displaced fracture of the femoral shaft.

Right shoulder 3 views:

Impacted fracture across the surgical neck of the humerus with extension to the
humeral head. The greater tuberosity is a separate fracture fragment slightly
distracted. Staple lines noted in the right lung apex.
IMPRESSION: 1. Comminuted impacted right humeral head and surgical neck fracture.

Right hip 2 view:

Femoral shaft fracture seen below the lesser trochanter. No definite fracture
across the femoral neck is evident. Patient is osteopenic. Degenerative changes
about the left sacroiliac joint and in the lower lumbar spine.
IMPRESSION: 1. Femoral shaft fracture.
2. Negative for hip fracture.

## 2008-10-24 IMAGING — CT CT HEAD W/O CM
3 of 5 series · 17 of 30 positions shown, 19 images · IV contrast (agent unspecified)
Comparison: None.
COMPARISON: None.

CLINICAL DATA: Dizziness, fall, fracture of the humerus. 
HEAD CT WITHOUT CONTRAST:
TECHNIQUE: The brain is somewhat atrophic with some chronic microvascular ischemic change.  But there is no evidence of acute infarct, hemorrhage, mass, mass effect, midline shift or abnormal extra-axial fluid collection.   No hydrocephalus.  The calvarium is intact.  Nasal septal deviation to the right is noted.  The imaged paranasal sinuses and mastoid air cells are clear.
TECHNIQUE: Multidetector CT imaging was performed according to the standard protocol.  Multiplanar CT image reconstructions were also generated.

[Series 3: recon 2: brain · axial · 0.47mm/px · z∈[-81,+2]mm · 4 of 56 slices shown]
[im 12/56  brain]
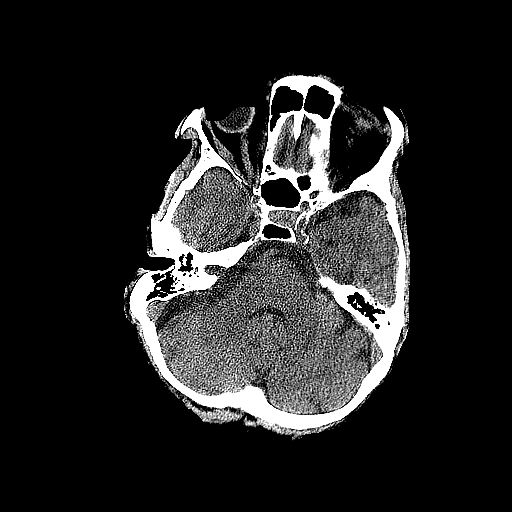
[im 23/56  brain]
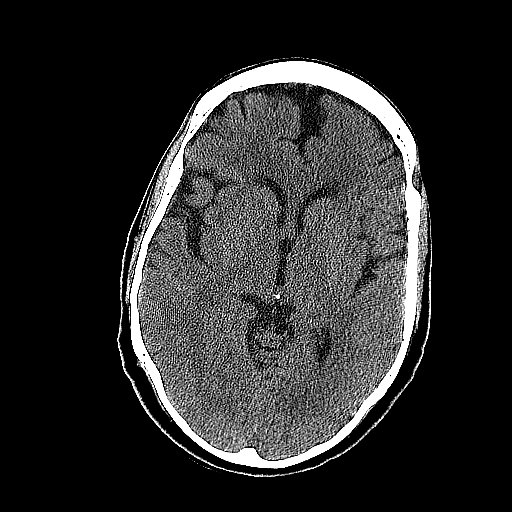
[im 34/56  brain]
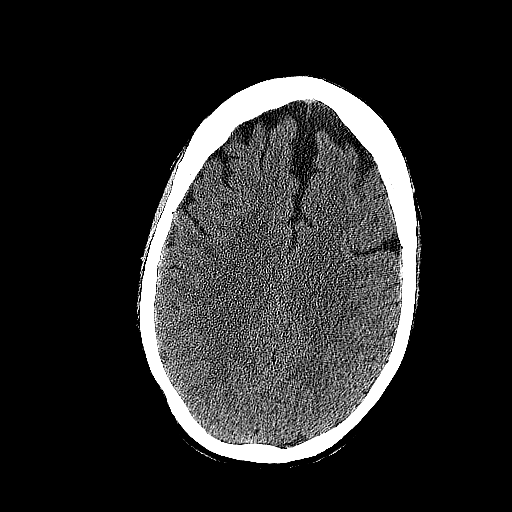
[im 45/56  brain]
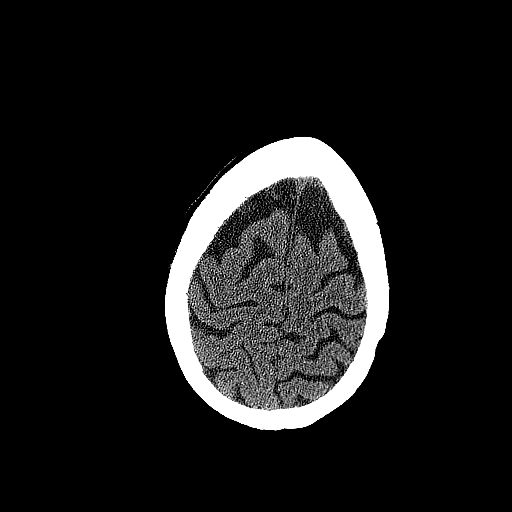

[Series 4: rt shoulder · axial · 0.47mm/px · z∈[-367,-257]mm · 5 of 66 slices shown, 7 images (1 of 2)]
[im 11/66  brain]
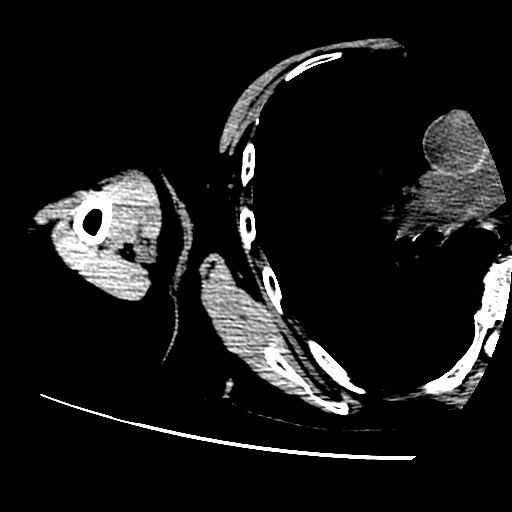
[im 11/66  bone]
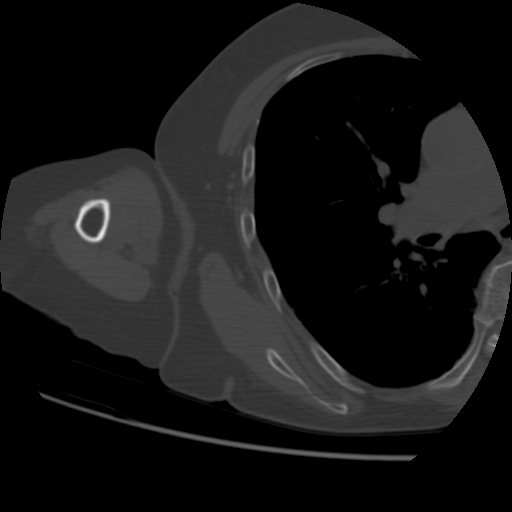
[im 22/66  brain]
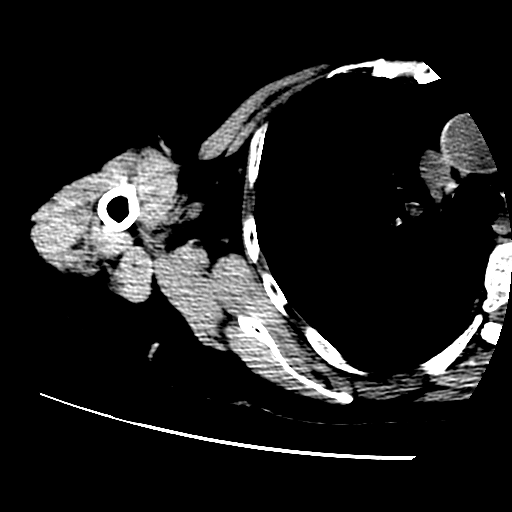
[im 33/66  brain]
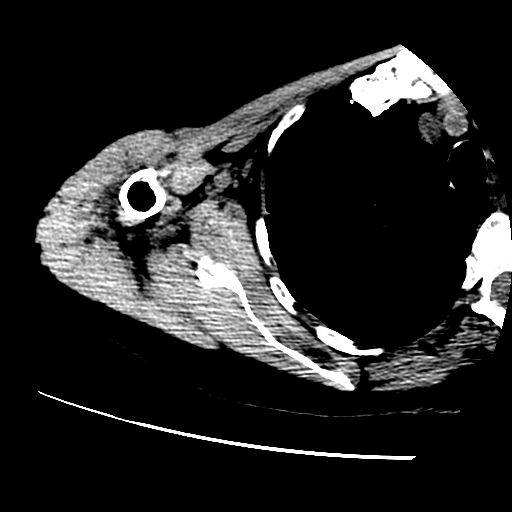
[im 44/66  brain]
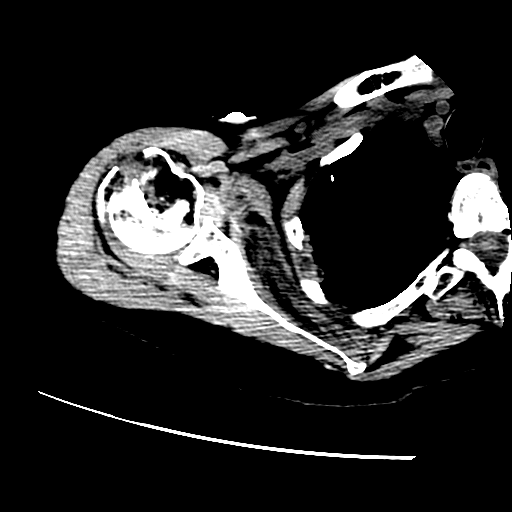
[im 55/66  brain]
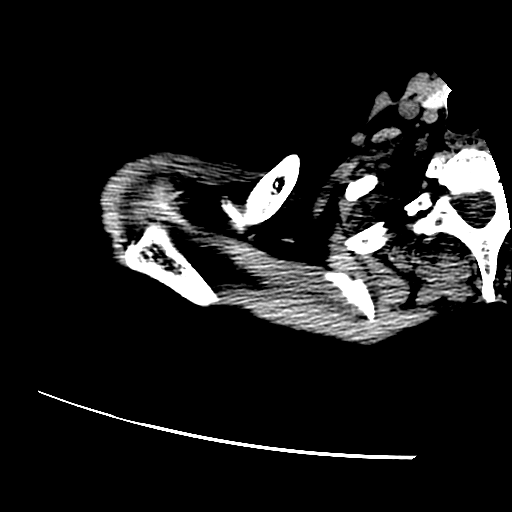
[im 55/66  bone]
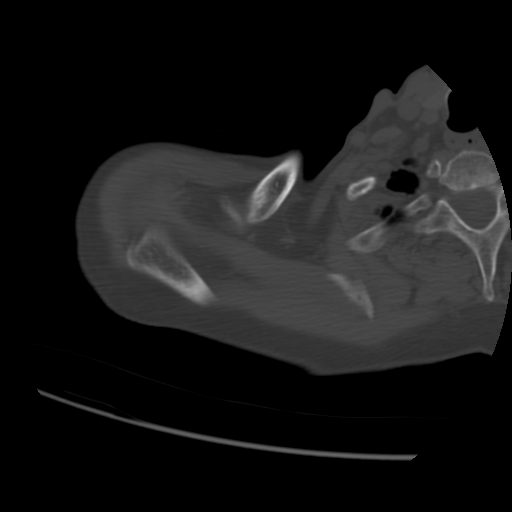

[Series 106: rt shoulder · oblique · 0.43mm/px · 8 of 91 slices shown (2 of 2)]
[im 11/91  brain]
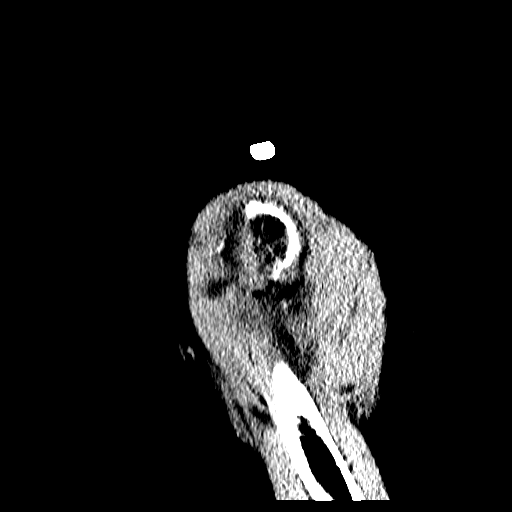
[im 21/91  brain]
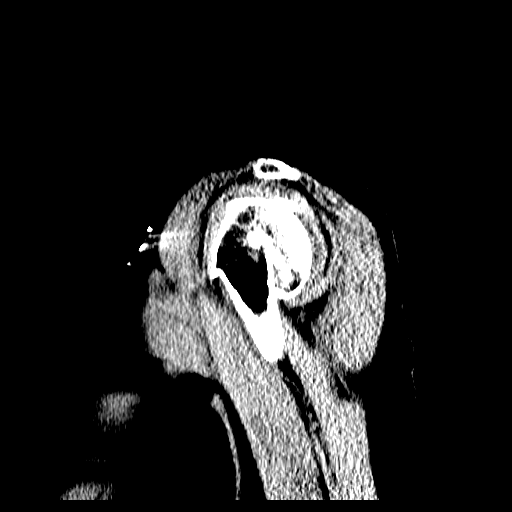
[im 31/91  brain]
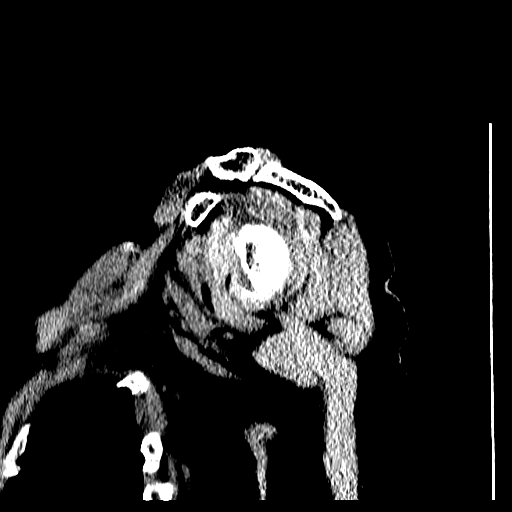
[im 41/91  brain]
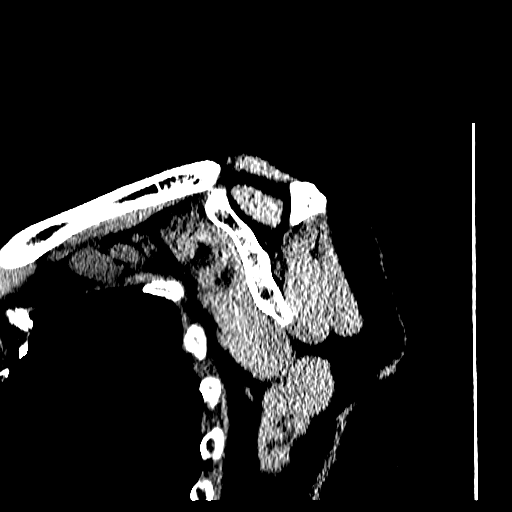
[im 51/91  brain]
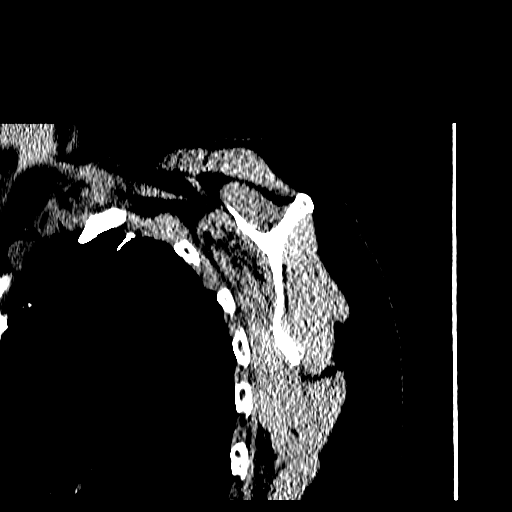
[im 61/91  brain]
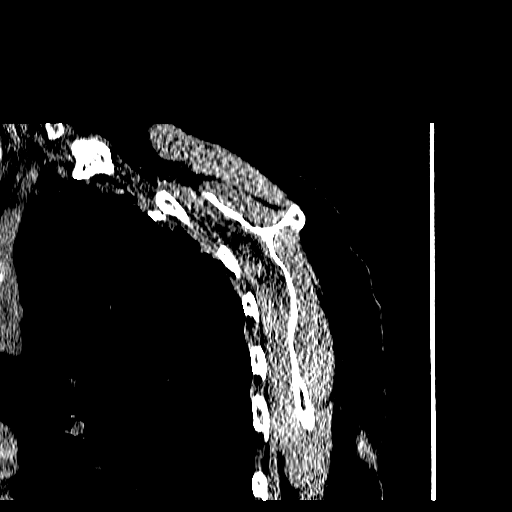
[im 71/91  brain]
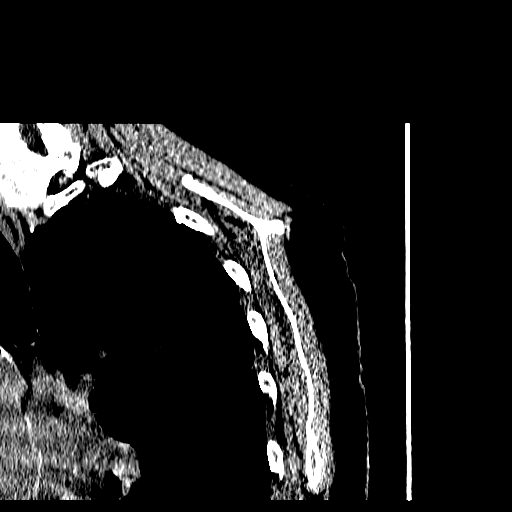
[im 81/91  brain]
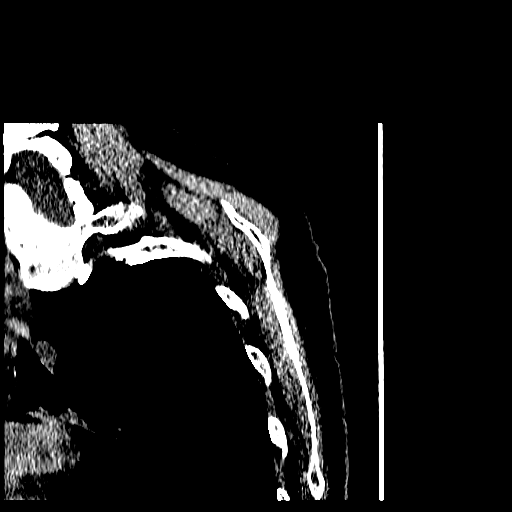

[17 of 30 positions shown; findings below may reference images not displayed]

IMPRESSION: Atrophy and chronic microvascular ischemic change without acute abnormality. 
CT OF THE RIGHT SHOULDER WITHOUT CONTRAST:
FINDINGS: There is a fracture of the surgical neck of the right humerus with approximately 3 cm of impaction of the humeral shaft into the head and approximately [DATE] shaft width anterior displacement. The fracture extends into the greater tuberosity with sparing of the lesser tuberosity.  The scapula, right clavicle and imaged ribs are intact.  The imaged lung demonstrates severe emphysematous disease but is otherwise unremarkable.
IMPRESSION: 1.  Impacted surgical neck fracture of the right humerus with involvement of the greater tuberosity.  
2.  Severe emphysema.

## 2008-10-24 IMAGING — CR DG CHEST 1V
1 series · 1 of 1 positions shown · non-contrast
Comparison: none

CLINICAL DATA: Hypertension , dizziness

Chest one view:
Comparison 07/03/2004. Staple line at the right lung apex. Lungs are
hyperinflated. Heart size normal. No effusion. No pneumothorax. Atheromatous
aortic arch.

[view not recorded]
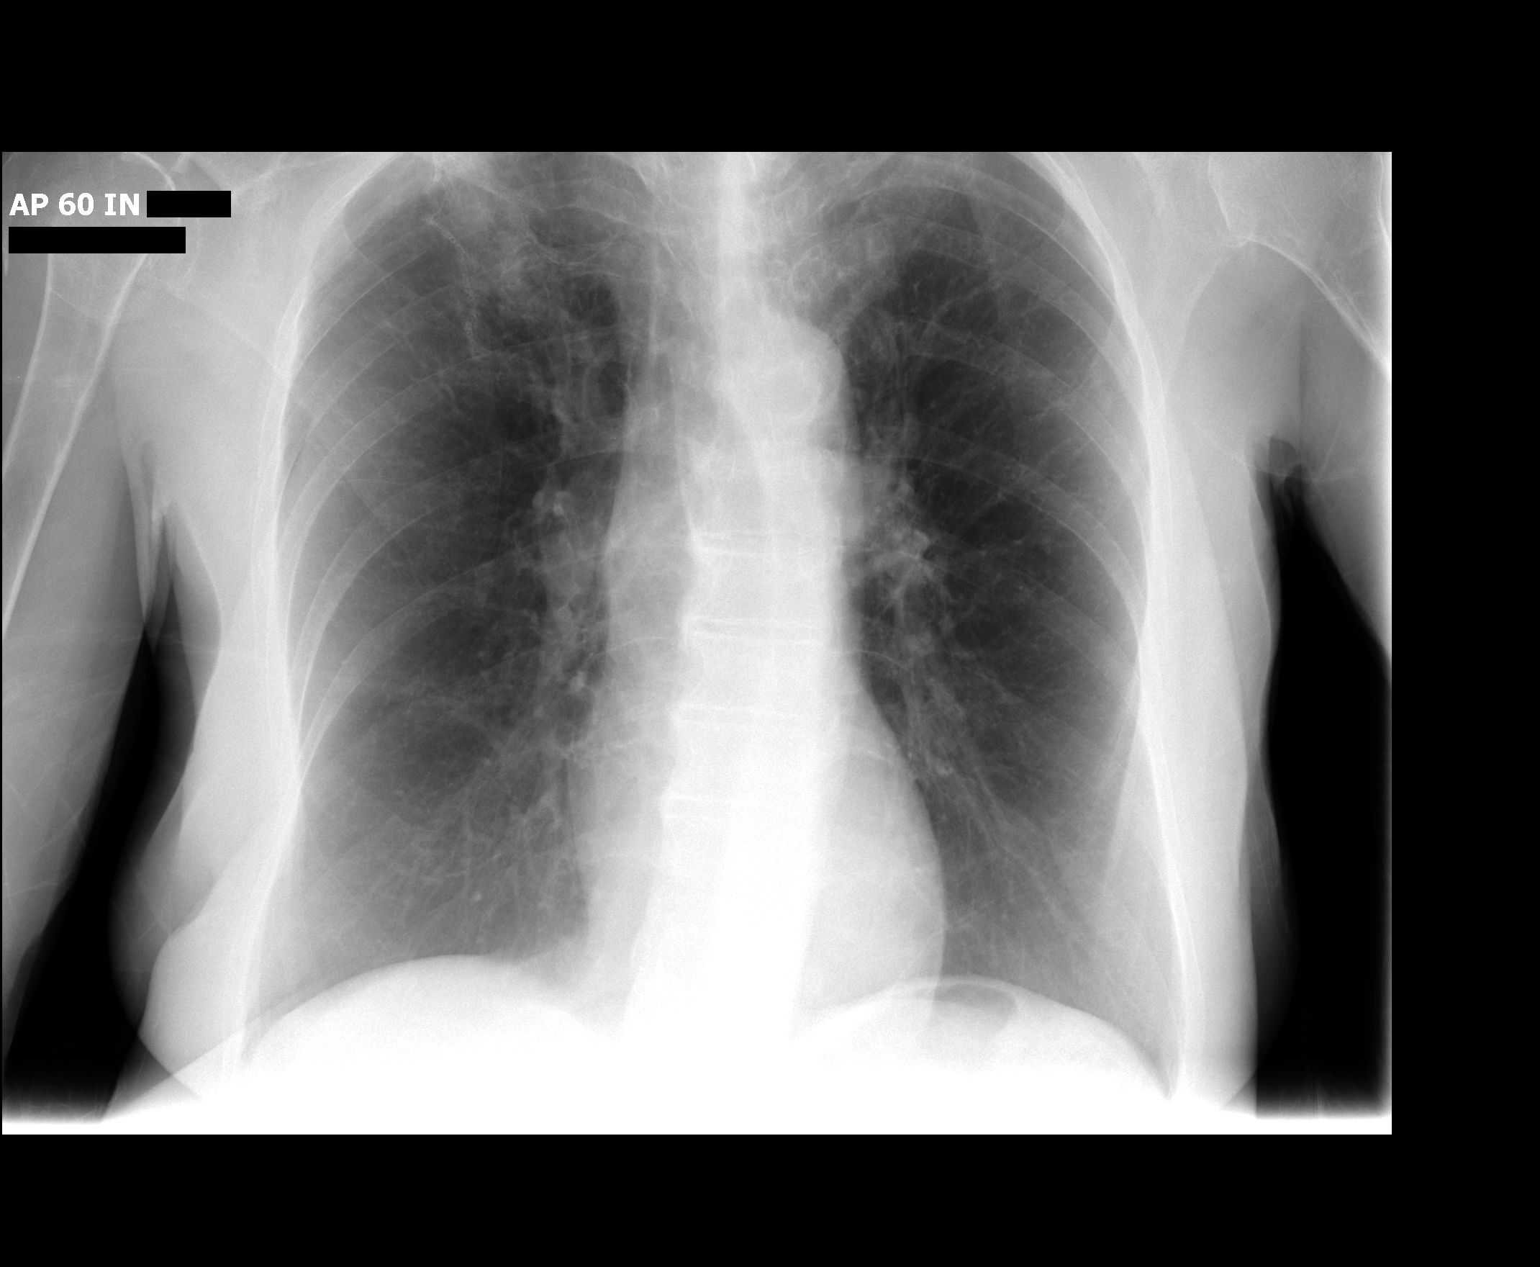

[1 of 1 positions shown; findings below may reference images not displayed]

IMPRESSION: 1. No acute disease. Stable appearance since 07/03/2004.

## 2008-10-24 IMAGING — CR DG HIP COMPLETE 2+V*R*
1 series · 1 of 1 positions shown · non-contrast
Comparison: none

Clinical: Fell

Right femur 2 view:
Spiral fracture through the mid diaphysis of the femur, with approximately 2 cm
override of the major fracture fragments, anterior displacement and mild
anterior angulation of the distal shaft fragment. No extension to the articular
surface.

[view not recorded]
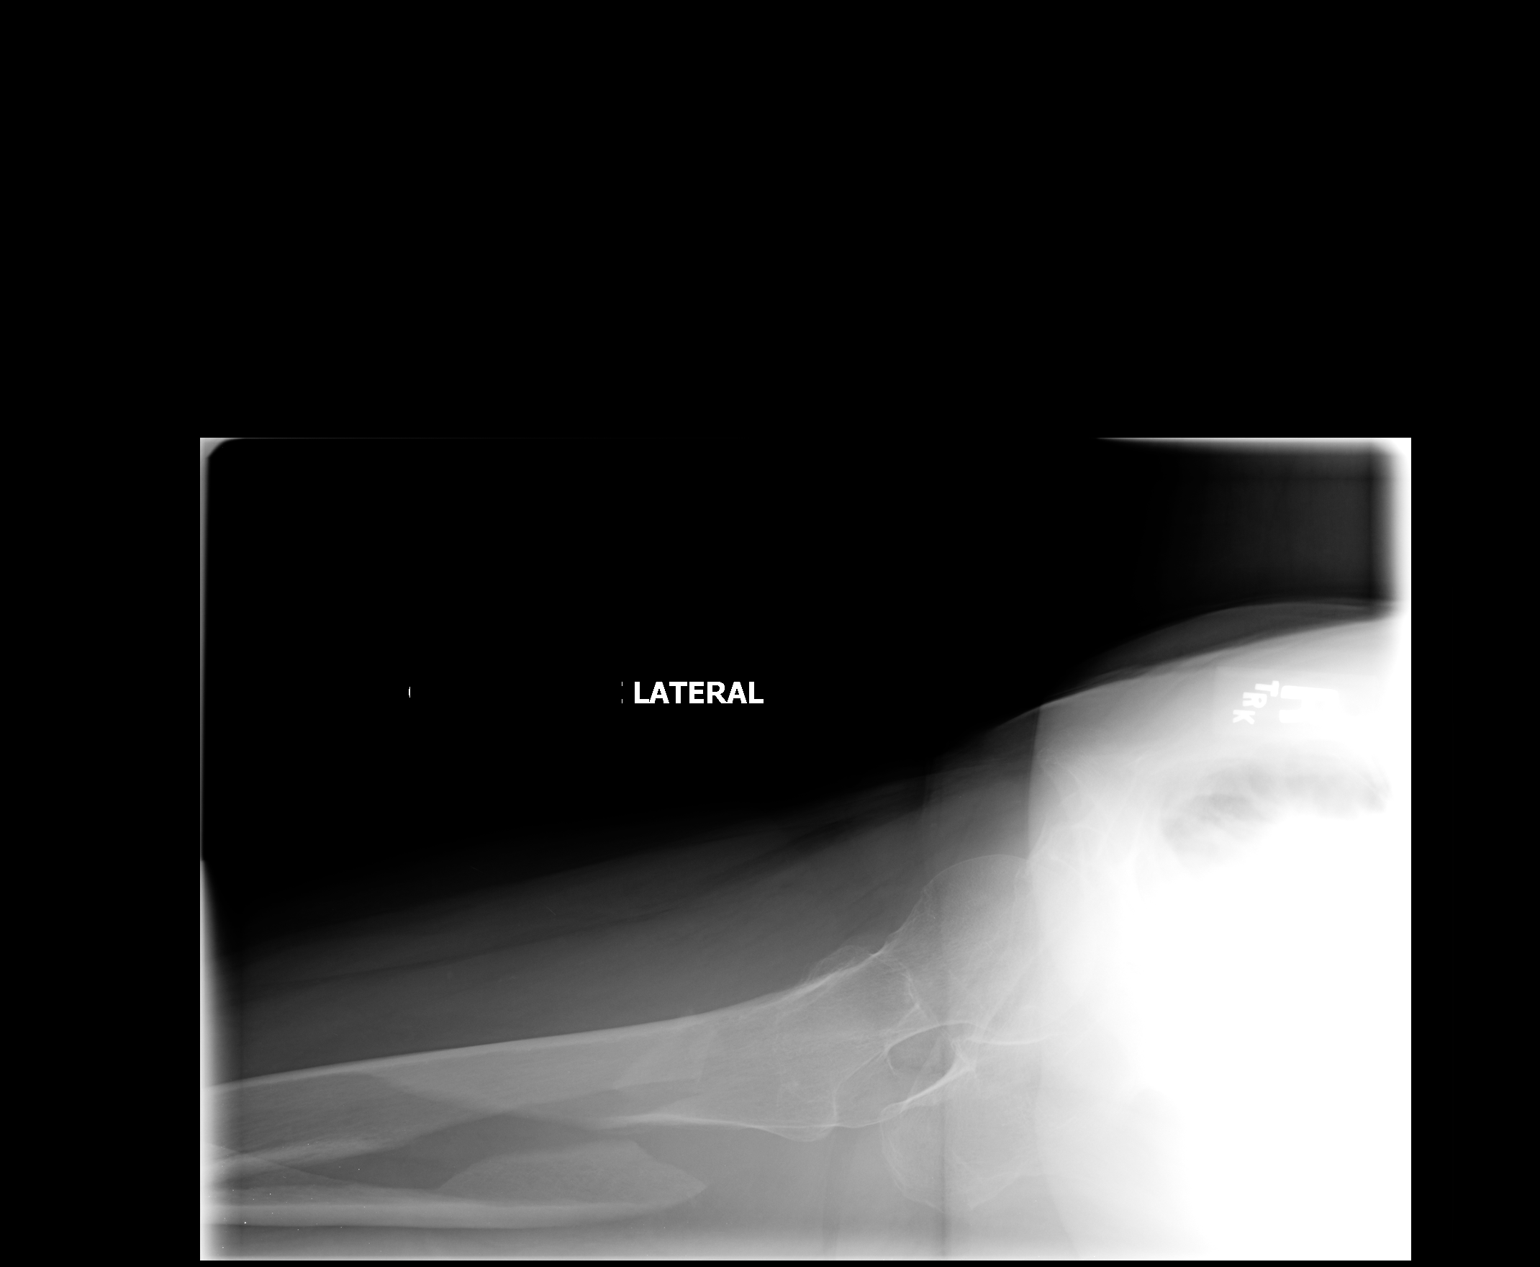

[1 of 1 positions shown; findings below may reference images not displayed]

IMPRESSION: 1. Displaced fracture of the femoral shaft.

Right shoulder 3 views:

Impacted fracture across the surgical neck of the humerus with extension to the
humeral head. The greater tuberosity is a separate fracture fragment slightly
distracted. Staple lines noted in the right lung apex.
IMPRESSION: 1. Comminuted impacted right humeral head and surgical neck fracture.

Right hip 2 view:

Femoral shaft fracture seen below the lesser trochanter. No definite fracture
across the femoral neck is evident. Patient is osteopenic. Degenerative changes
about the left sacroiliac joint and in the lower lumbar spine.
IMPRESSION: 1. Femoral shaft fracture.
2. Negative for hip fracture.

## 2008-10-24 IMAGING — CR DG FEMUR 2+V*R*
1 series · 1 of 1 positions shown · non-contrast
Comparison: none

Clinical: Fell

Right femur 2 view:
Spiral fracture through the mid diaphysis of the femur, with approximately 2 cm
override of the major fracture fragments, anterior displacement and mild
anterior angulation of the distal shaft fragment. No extension to the articular
surface.

[t femur with knee ap right]
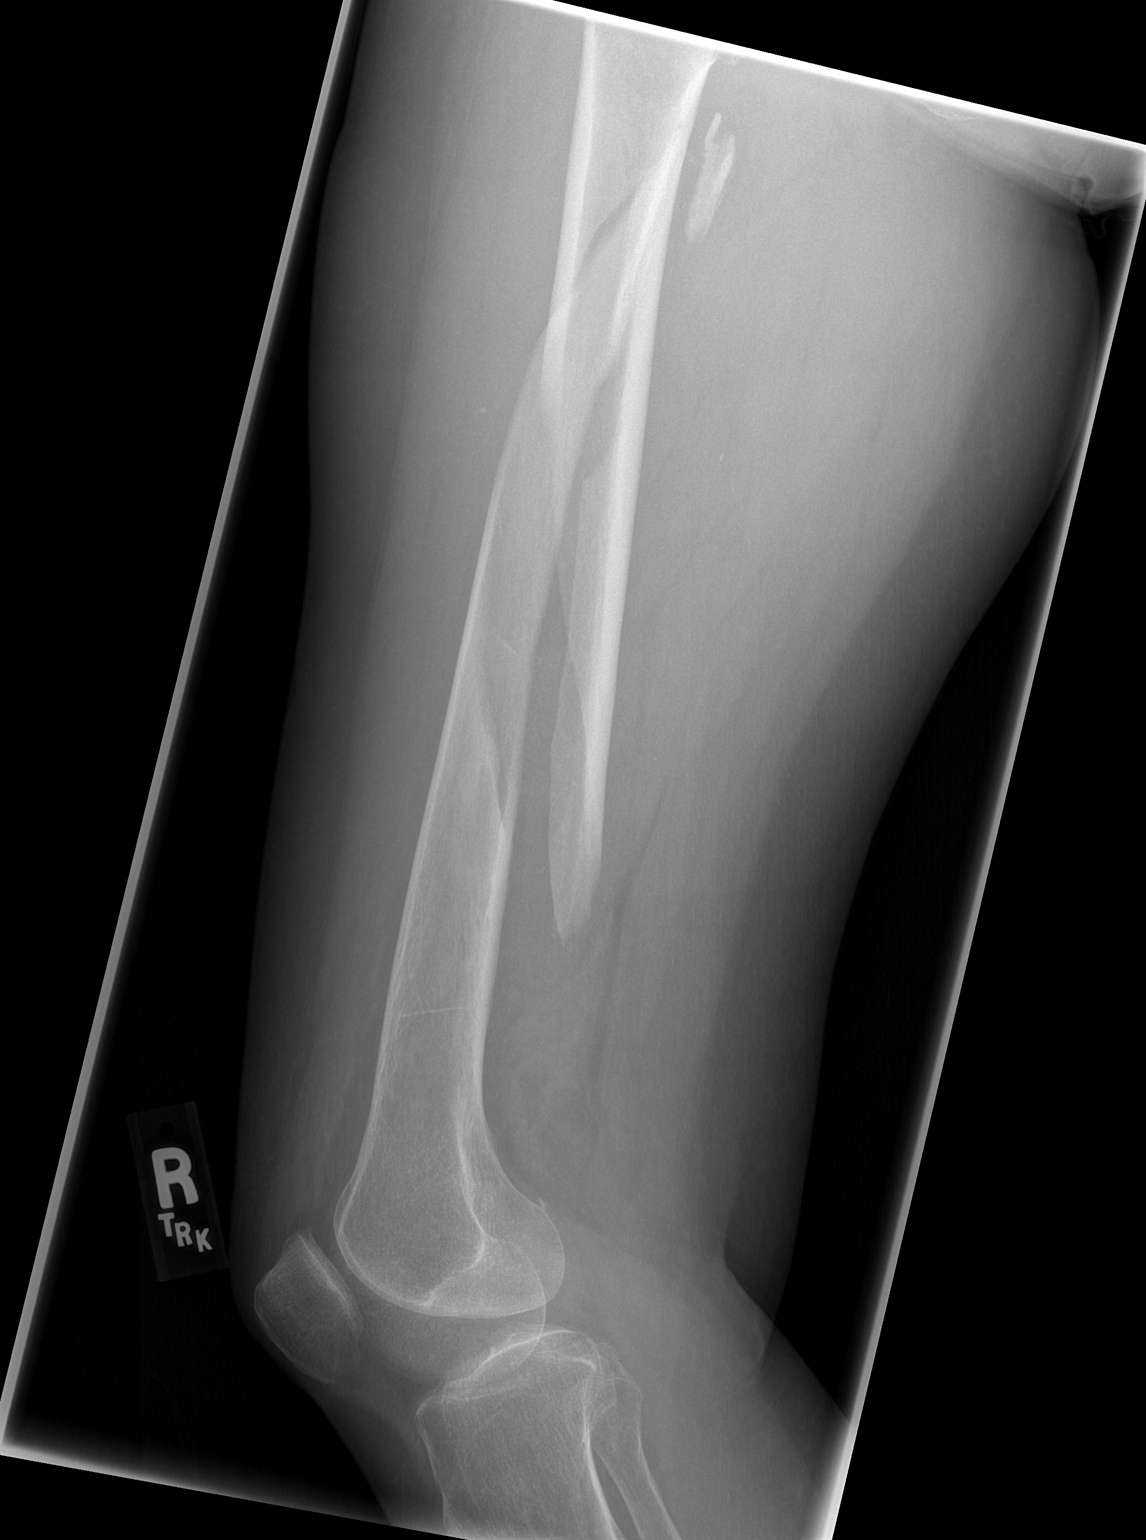

[1 of 1 positions shown; findings below may reference images not displayed]

IMPRESSION: 1. Displaced fracture of the femoral shaft.

Right shoulder 3 views:

Impacted fracture across the surgical neck of the humerus with extension to the
humeral head. The greater tuberosity is a separate fracture fragment slightly
distracted. Staple lines noted in the right lung apex.
IMPRESSION: 1. Comminuted impacted right humeral head and surgical neck fracture.

Right hip 2 view:

Femoral shaft fracture seen below the lesser trochanter. No definite fracture
across the femoral neck is evident. Patient is osteopenic. Degenerative changes
about the left sacroiliac joint and in the lower lumbar spine.
IMPRESSION: 1. Femoral shaft fracture.
2. Negative for hip fracture.

## 2008-10-24 IMAGING — RF DG FEMUR 2+V*R*
1 series · 5 of 5 positions shown · non-contrast
Comparison: none

CLINICAL DATA: Right femur fracture, internal fixation.  
 INTRAOPERATIVE RIGHT FEMUR ? 4 VIEW:

[Series 1: run · 5 of 5 slices shown]
[im 1/5]
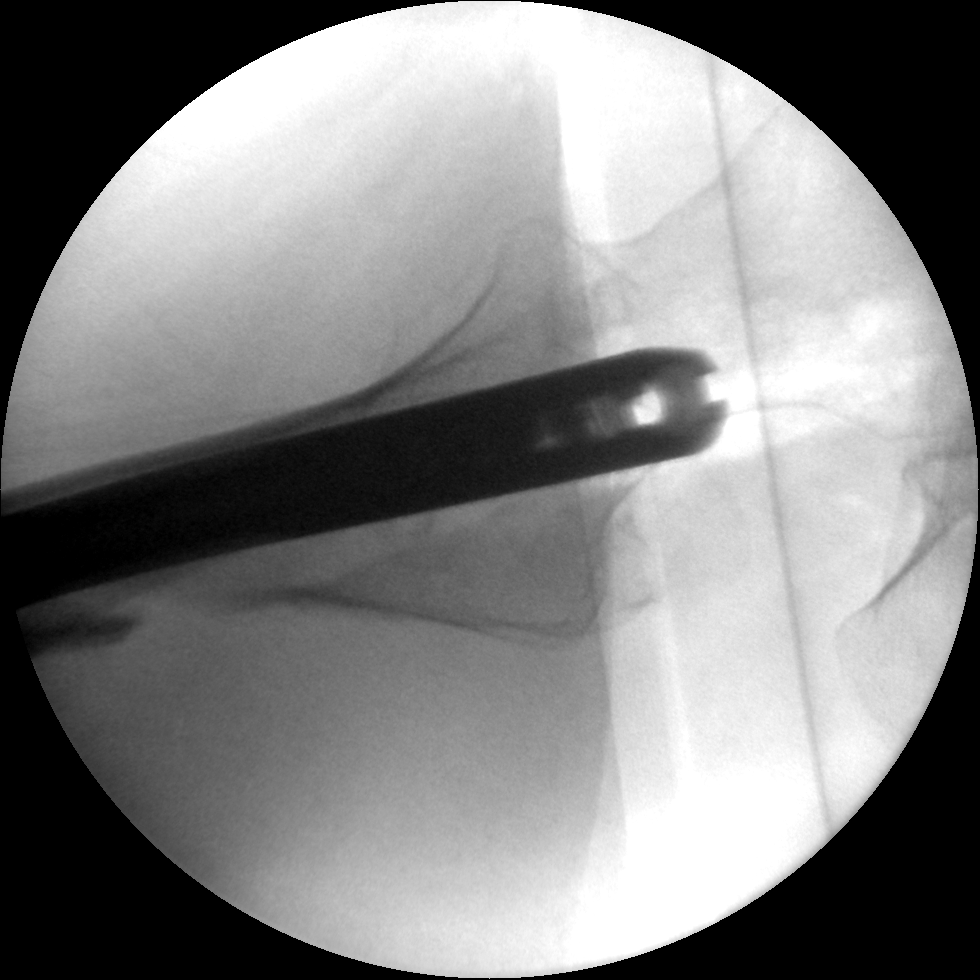
[im 2/5]
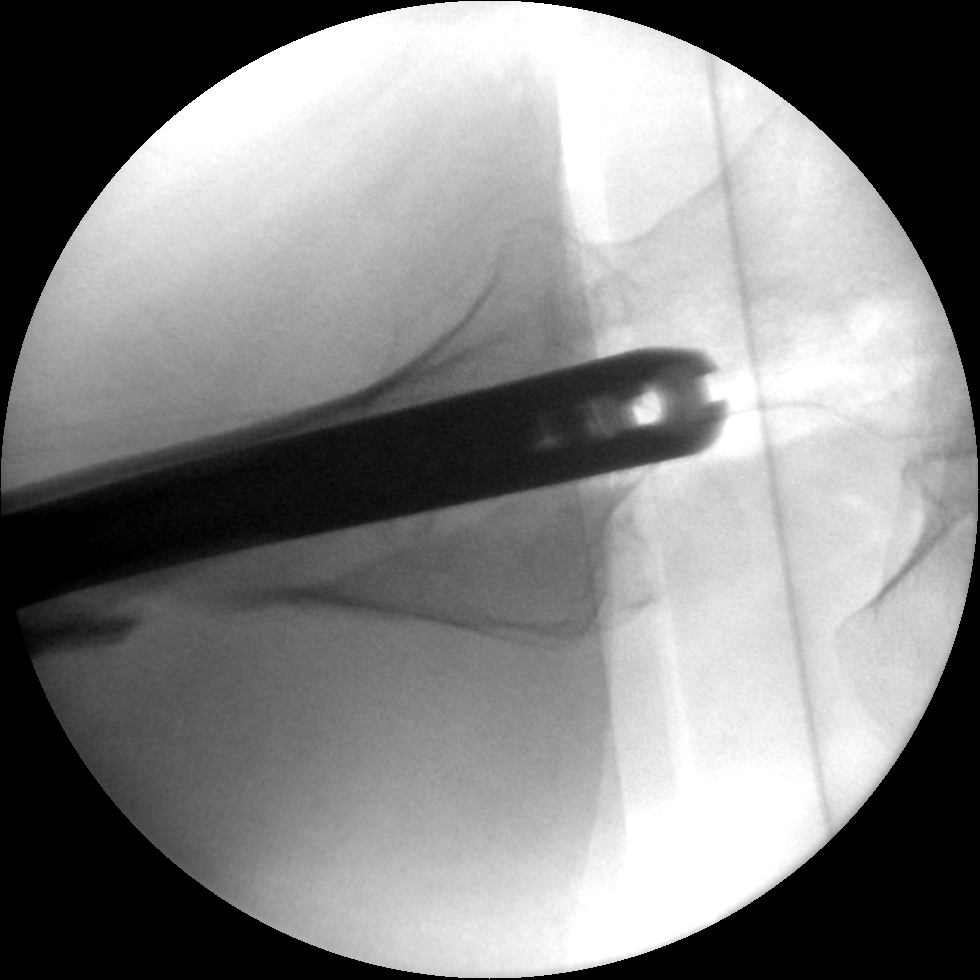
[im 3/5]
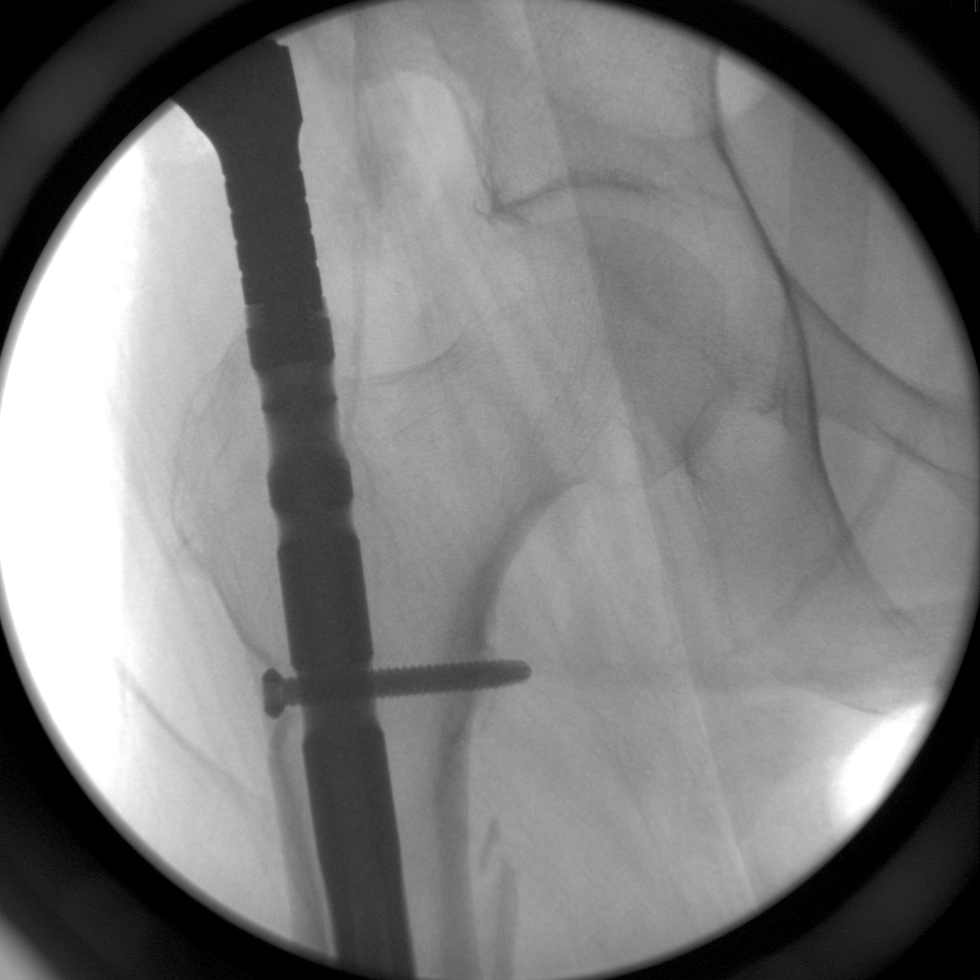
[im 4/5]
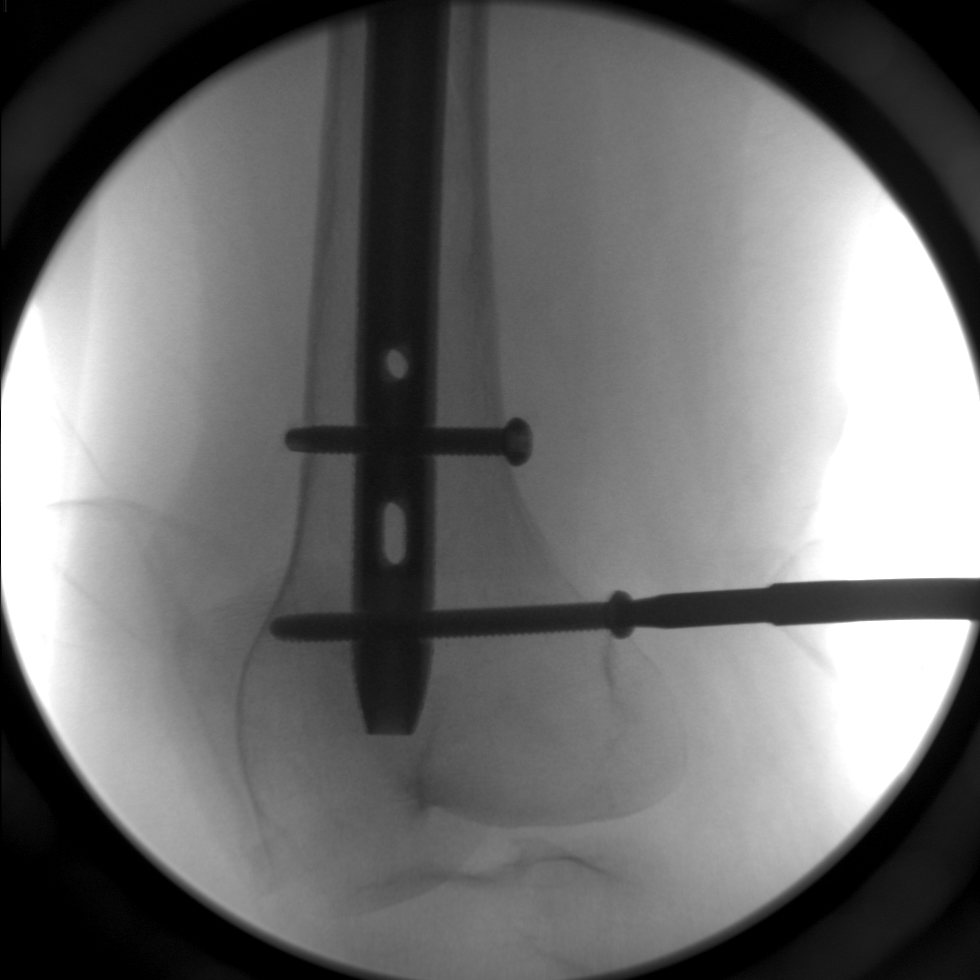
[im 5/5]
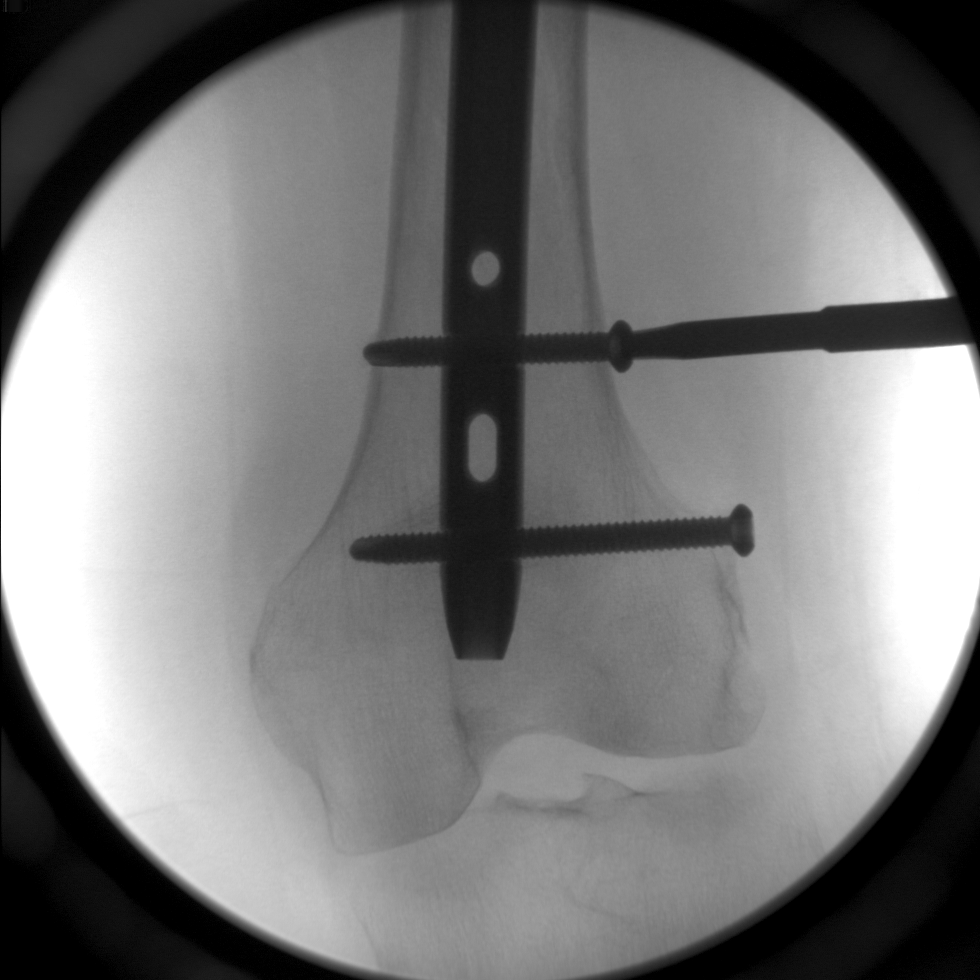

[5 of 5 positions shown; findings below may reference images not displayed]

FINDINGS: Four intraoperative spot views of the right femur are submitted postoperatively for interpretation.   An intramedullary rod with proximal and distal transfixing screws are noted traversing a right femur fracture.
IMPRESSION: Intramedullary rod placement traversing femur fracture.

## 2009-08-16 IMAGING — CT CT HEAD WO/W CM
1 of 2 series · 13 of 30 positions shown, 17 images · IV contrast (75CC OMNI 300)
Comparison: CT head without contrast, 06/13/06.

CLINICAL DATA: 66 year old female with frontal headaches x?s several months with right sided weakness and dizziness.  History of head injury status post fall in 4554.
 HEAD CT WITHOUT AND WITH CONTRAST:
TECHNIQUE: Contiguous axial images were obtained from the base of the skull through the vertex according to standard protocol before and after administration of intravenous contrast.
 Contrast:  75 cc Omnipaque 300

[Series 2: head w/o · axial · non-contrast · 0.45mm/px · z∈[+22,+146]mm · 13 of 28 slices shown, 17 images]
[im 2/28  brain]
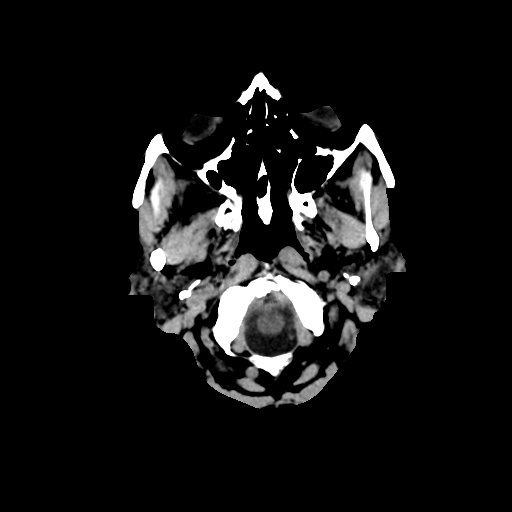
[im 2/28  bone]
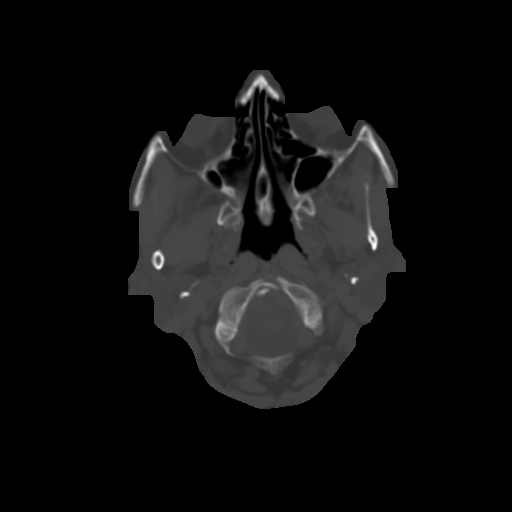
[im 4/28  brain]
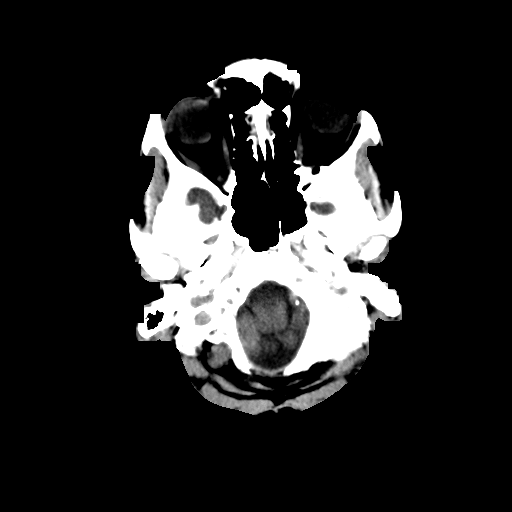
[im 6/28  brain]
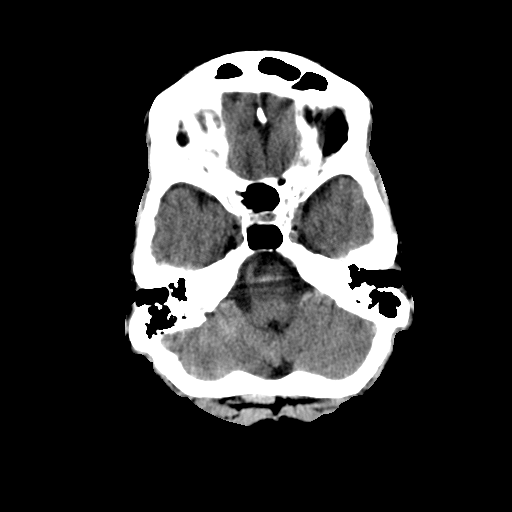
[im 8/28  brain]
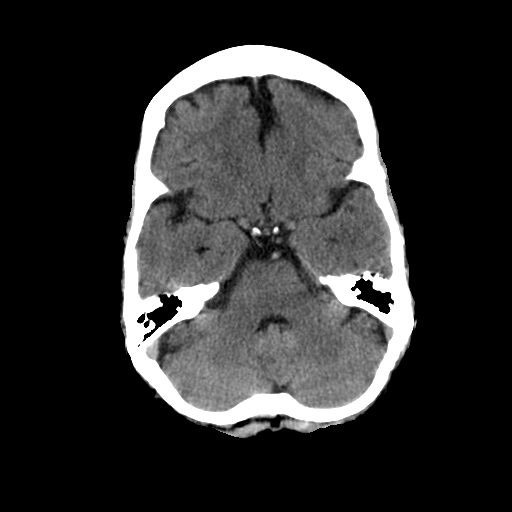
[im 10/28  brain]
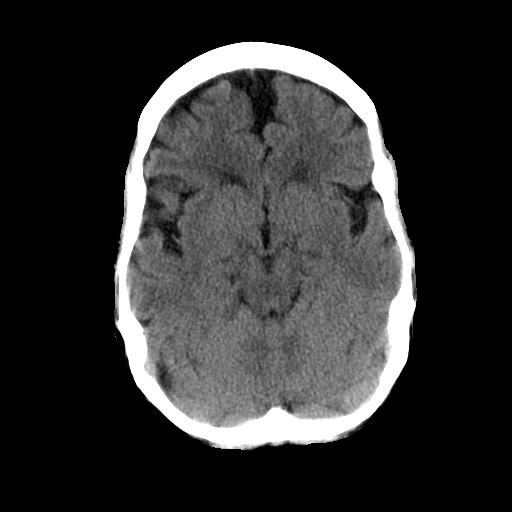
[im 10/28  bone]
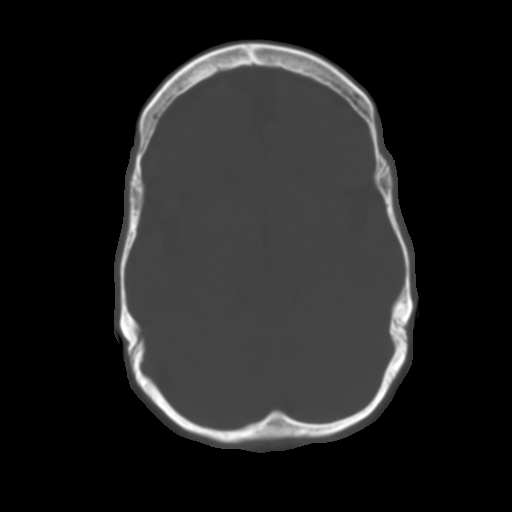
[im 12/28  brain]
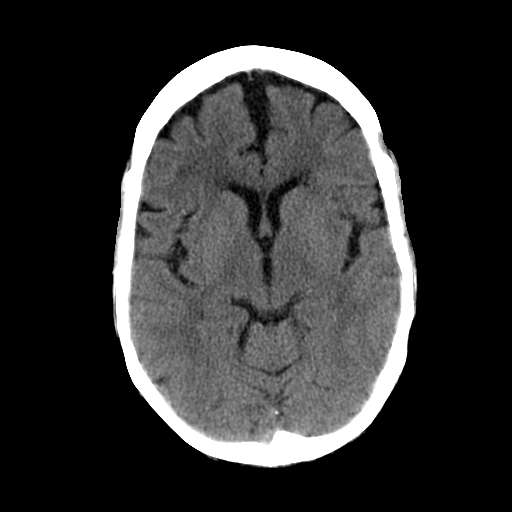
[im 14/28  brain]
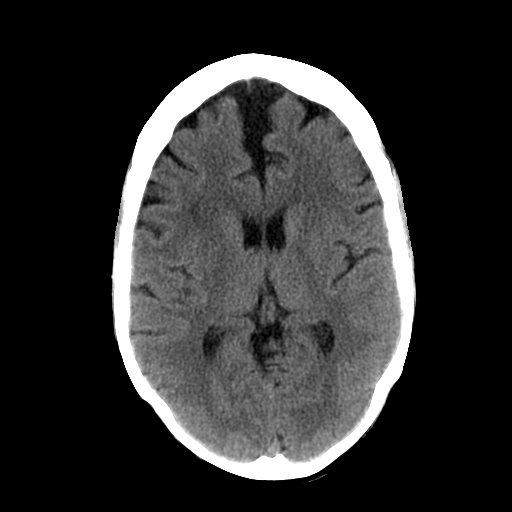
[im 16/28  brain]
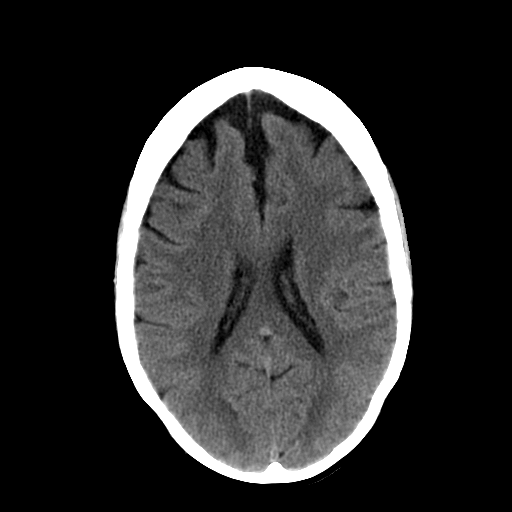
[im 18/28  brain]
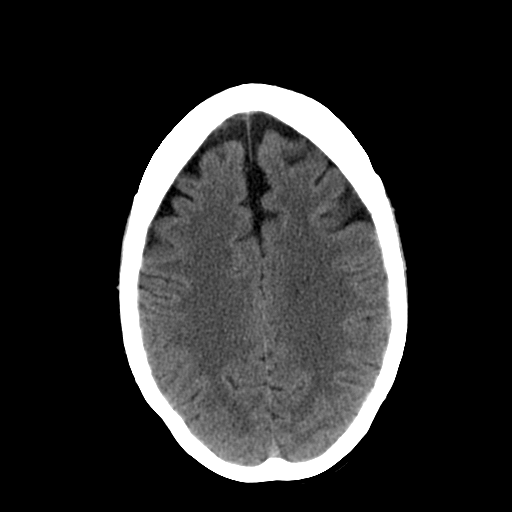
[im 18/28  bone]
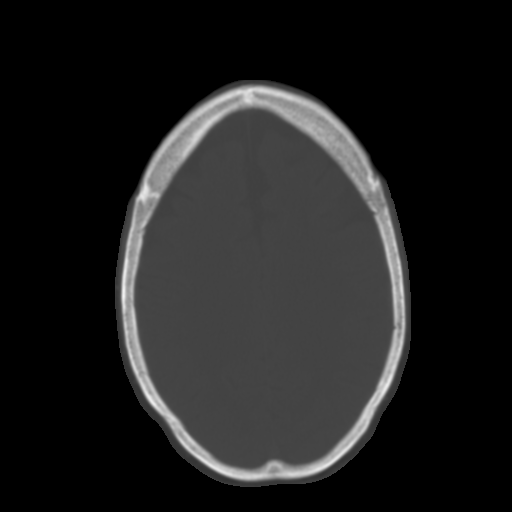
[im 20/28  brain]
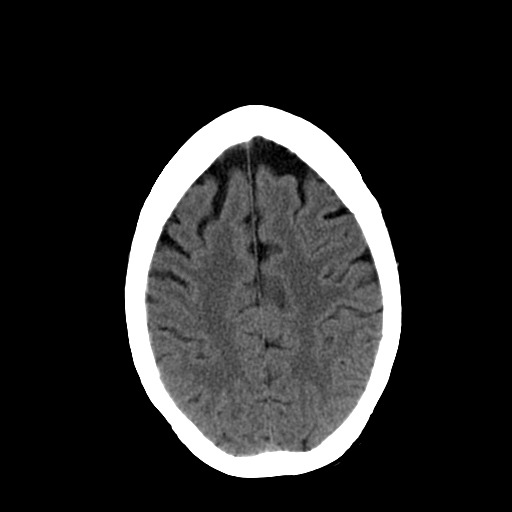
[im 22/28  brain]
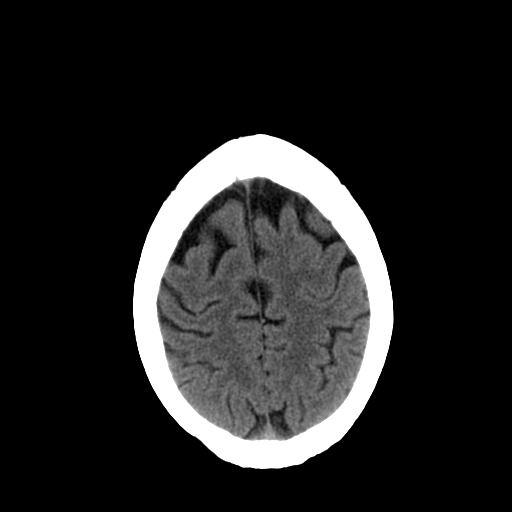
[im 24/28  brain]
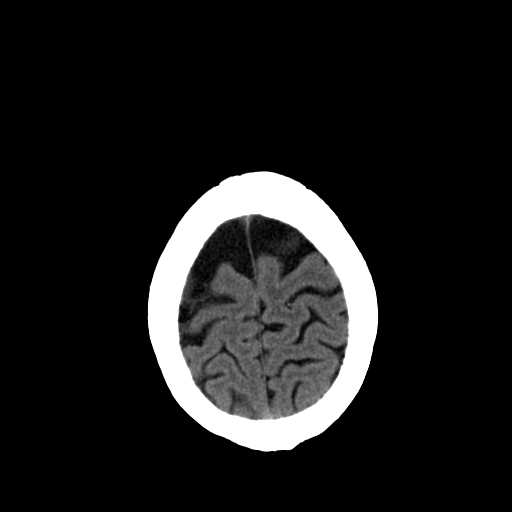
[im 26/28  brain]
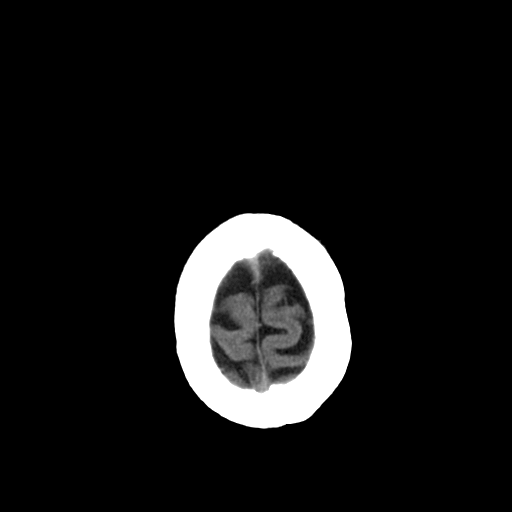
[im 26/28  bone]
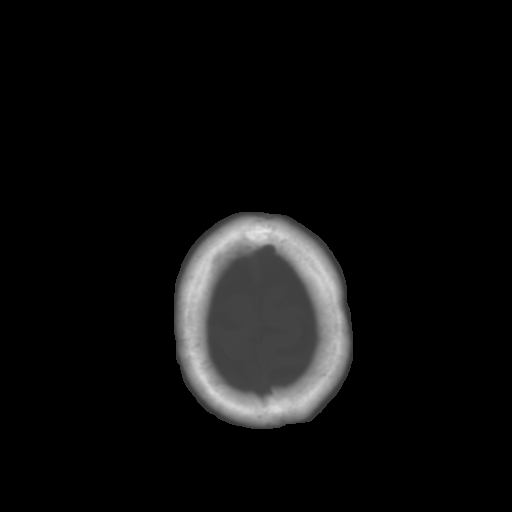

[13 of 30 positions shown; findings below may reference images not displayed]

FINDINGS: Generalized volume loss, advanced for age is stable.  No midline shift, ventriculomegaly, mass effect or intracranial hemorrhage.  Occasional subcortical white matter hypodensity (right frontal lobe, series 2/image 14), otherwise normal gray/white matter differentiation throughout the brain.  No evidence of acute cortically based infarct.  No mass or abnormal enhancement following contrast.  Major intracranial vascular structures are normally enhancing.  Chronic vascular calcifications of the carotid siphons and distal left vertebral artery.  Normal orbits, scalp soft tissues, osseous structures, visualized paranasal sinuses, and mastoids.  There is hypoplasia of the right maxillary sinus partially visualized, unchanged.
IMPRESSION: 1.  No acute intracranial abnormality.
 2.  Stable generalized advanced volume loss for age and mild small vessel disease.

## 2010-04-05 ENCOUNTER — Encounter: Payer: Self-pay | Admitting: Cardiothoracic Surgery

## 2010-04-29 ENCOUNTER — Other Ambulatory Visit: Payer: Self-pay | Admitting: Family Medicine

## 2010-04-29 DIAGNOSIS — R634 Abnormal weight loss: Secondary | ICD-10-CM

## 2010-04-29 DIAGNOSIS — R109 Unspecified abdominal pain: Secondary | ICD-10-CM

## 2010-05-01 ENCOUNTER — Inpatient Hospital Stay
Admission: RE | Admit: 2010-05-01 | Discharge: 2010-05-01 | Payer: Self-pay | Source: Ambulatory Visit | Attending: Family Medicine | Admitting: Family Medicine

## 2010-05-06 ENCOUNTER — Ambulatory Visit
Admission: RE | Admit: 2010-05-06 | Discharge: 2010-05-06 | Disposition: A | Discharge status: Home / Self Care | Payer: Medicare Other | Source: Ambulatory Visit | Attending: Family Medicine | Admitting: Family Medicine

## 2010-05-06 ENCOUNTER — Other Ambulatory Visit: Payer: Self-pay | Admitting: Family Medicine

## 2010-05-06 ENCOUNTER — Inpatient Hospital Stay: Admission: RE | Admit: 2010-05-06 | Payer: Self-pay | Source: Ambulatory Visit

## 2010-05-06 DIAGNOSIS — R634 Abnormal weight loss: Secondary | ICD-10-CM

## 2010-05-06 DIAGNOSIS — R109 Unspecified abdominal pain: Secondary | ICD-10-CM

## 2010-05-06 MED ORDER — IOHEXOL 300 MG/ML  SOLN
100.0000 mL | Freq: Once | INTRAMUSCULAR | Status: AC | PRN
  Administered 2010-05-06: 100 mL via INTRAVENOUS

## 2010-07-31 NOTE — Discharge Summary (Signed)
[name] [name], [name] [name]               ACCOUNT NO.:  [id number]   MEDICAL RECORD NO.:  [id number]          PATIENT TYPE:  INP   LOCATION:  5001                         FACILITY:  [facility]   PHYSICIAN:  Mark C. Ophelia Charter, M.D.    DATE OF BIRTH:  [birth year]   DATE OF ADMISSION:  06/13/2006  DATE OF DISCHARGE:  06/17/2006                               DISCHARGE SUMMARY   ADMISSION DIAGNOSES:  1. Right comminuted femoral neck fracture.  2. Right proximal humerus fracture.  3. Chronic obstructive pulmonary disease.  4. Dizziness and vertigo.  5. History of myocardial infarction.  6. Hypotension.  7. Leukocytosis on admission.  8. Tobacco abuse.  9. Status post video-assisted thoracic surgery for spontaneous      pneumothorax, May 19, 2006.  10.Peptic ulcer disease.   DISCHARGE DIAGNOSES:  1. Right comminuted femoral neck fracture.  2. Right proximal humerus fracture.  3. Chronic obstructive pulmonary disease.  4. Dizziness and vertigo.  5. History of myocardial infarction.  6. Hypotension.  7. Leukocytosis on admission.  8. Tobacco abuse.  9. Status post video-assisted thoracic surgery for spontaneous      pneumothorax, May 19, 2006.  10.Peptic ulcer disease.  11.Urinary retention, mixed stress and urge incontinence and atrophic      vaginitis diagnosed by Dr. Patsi Sears during the hospital stay      after Urology consult.  12.Urinary tract infection with cultures indicating Escherichia coli.      Patient on Cipro 500 mg by mouth twice daily at discharge.  13.Post hemorrhagic anemia requiring blood transfusion.  14.Thrombocytopenia, stable.   PROCEDURE:  On June 13, 2006, the patient underwent right femoral  antegrade locking nail with proximal and distal interlocking screws  performed by Dr. Ophelia Charter under general anesthesia.   CONSULTATIONS:  Dr. Patsi Sears, Urology; Dr. [name] for Internal  Medicine.   BRIEF HISTORY:  The patient is a 70 year old white female who presented  to  [facility] emergency room after falling on the day of admission [place] in [place] while awaiting a train.  She had been  visiting her brother in [place].  She was returning to her home in  [place].  She developed dizziness and fell to the ground, injuring her  right shoulder and right lower extremity.  In the emergency room, x-rays  revealed a spiral fracture of the right femur with impacted humeral neck  fracture, both on the right.  It was felt that she would require  surgical intervention for the right femur fracture.  She did have  multiple co-morbidities and medical evaluation was obtained.   BRIEF HOSPITAL COURSE:  Upon admission through the emergency room, the  patient did complain of syncopal episodes which had been present for  several weeks prior to this admission.  She also had discontinued her  hypertensive medications secondary to running out of these at home.  She  was seen by the [facility] hospitalist in consultation.  On admission she had  leukocytosis.  Multiple labs were drawn, including chest x-ray,  urinalysis and blood cultures.  The patient had history of MI which  required EKG and cardiac enzymes.  For her dizziness, CT of the head was  obtained.  For her COPD, she was treated with O2 and bronchodilators and  nebulizers.   There was difficulty with insertion of the Foley catheter, which  required a consult from Dr. Patsi Sears.  Foley catheter was inserted.  A  specimen was sent for urinalysis which did show signs of urinary tract  infection and a urine culture returned showing greater than 100,000  colonies of E. coli.  She was treated with Ancef perioperatively and  then changed to Septra DS by mouth twice daily.  After the sensitivities  were available, the patient was changed to ciprofloxacin 500 mg 1 by  mouth twice daily.   She did undergo the orthopedic procedure as stated above on June 13, 2006.  Postoperatively she did have significant  acute blood loss anemia  requiring blood transfusion.  She received 2 units of packed red blood  cells during the hospital stay.  Hemoglobin and hematocrit remained  somewhat low post transfusion; however, were felt to be stable at 8.4  and 25.3 respectively.   The patient's right proximal humerus fracture was treated with a  shoulder immobilizer.  She was allowed partial weight bearing on the  right lower extremity, and no weight bearing on the right upper  extremity.  This did limit the amount of physical therapy that she could  do during the hospital stay.  Essentially she was for bed-to-chair  transfers only.  She was eventually able to stand-pivot-transfer to her  strong side.  It was recommended that she would require skilled nursing  facility for continuation of her physical therapy to begin being more  independent prior to discharge.   She received a Nutrition consult.  The patient was felt to be stable  from a Nutrition standpoint, and continued on her regular diet.  She was  assisted by her brother in making a decision for her skilled nursing  facility.  On June 17, 2006, a decision was made and she was felt stable  for transfer to a skilled nursing facility for continuation of her care.   PERTINENT LABORATORY VALUES:  Hemoglobin and hematocrit as stated above.  Urinalysis as stated above.  On admission, her white blood cell count  was 25.4 and on last check prior to discharge was 11.2.  Platelet count  was decreased on June 16, 2006 at 68.  Coagulation studies on admission  were within normal limits.  Chemistry studies on admission with glucose  143, remaining values normal.  Hemoglobin A1c was noted to be 5.5.  The  patient was not a candidate for Coumadin use as she did have vertigo.  She was treated for DVT prophylaxis utilizing ankle pumps as well as  compression stockings and encouragement for increasing activity.  Blood cultures obtained on admission were negative.   Chest x-ray on admission  showed no acute disease, stable appearance since July 03, 2004.  CT of  the head without contrast showed atrophy and chronic microvascular  ischemic change, without acute abnormality.  EKG on admission showed  normal sinus rhythm, ST abnormality, possible digitalis effect.   PLAN:  The patient will be transferred to a nursing facility.  There she  will continue with her physical therapy.  She will require strict non-  weight bearing on the upper extremity and partial weight bearing of  approximately 25% on the right lower extremity.  She will have bed-to-  chair transfers until she  is felt stable enough to progress with  increasing weight bearing status on her lower extremity.  This will be  determined by x-rays at Dr. Ophelia Charter' office.  The patient should follow up  in Dr. Ophelia Charter' office approximately 2 weeks from the date of surgery.  An  appointment should be made by calling [phone] to arrange this.  The  patient will require transportation to and from his office as well.  She  should receive occupational therapy for ADLs.  Her goal is to return to  her home at an independent level.   Medications at discharge include albuterol inhaler every 4 hours,  Benadryl 25 mg 1 by mouth nightly as needed for sleep, Percocet 1-2  every 4-6 hours as needed for pain, Colace 1 by mouth twice daily,  enteric coated aspirin 325 mg 1 by mouth daily, Cipro 500 mg by mouth  twice daily.  She should be placed on iron supplementation.  It is  encouraged that she also have a multivitamin at the nursing facility.  Repeat urinalysis in 7-10 days to make sure she has cleared her urinary  tract infection and then discontinuation of her medication.  She has  also been placed on Mucinex 1 by mouth daily.   She has been on a carbohydrate-modified diet secondary to some increased  blood sugars during the hospital stay.  She should continue with  incentive spirometry every hour.  Repeat  hemoglobin and hematocrit in 1  week to make sure that her anemia is resolving.  She should also have a  repeat platelet count to make sure her thrombocytopenia is resolving.  Dressing change should be done on a daily basis to the right hip.  Light  dressing over the wound.  When there is no drainage, she may get he  wound wet.  Staples will be removed at Dr. Ophelia Charter' office at return  office visit.  The patient should remain in her shoulder immobilizer at  all times.  Strict no range of motion, no strengthening, and no weight  on the right upper extremity.  Ice packs may be utilized to the right  shoulder as needed, and also to the right hip as needed.  She should be  encouraged in ankle pumps.  Increased activity on a steady basis to  decrease her risk for DVT.  Strict skin checks and frequent turns while  she is in bed are necessary to prevent decubitus ulcers.  All questions  encouraged and answered prior to discharge.  CONDITION ON DISCHARGE:  Stable.      Wende Neighbors, P.A.      Mark C. Ophelia Charter, M.D.  Electronically Signed    SMV/MEDQ  D:  06/17/2006  T:  06/17/2006  Job:  [id number]

## 2010-07-31 NOTE — Op Note (Signed)
NAMESHEANA, BIR               ACCOUNT NO.:  [id number]   MEDICAL RECORD NO.:  [id number]          PATIENT TYPE:  INP   LOCATION:  2550                         FACILITY:  [facility]   PHYSICIAN:  Mark C. Ophelia Charter, M.D.    DATE OF BIRTH:  [birth year]   DATE OF PROCEDURE:  DATE OF DISCHARGE:                               OPERATIVE REPORT   PRE-AND-POSTOPERATIVE DIAGNOSIS:  Fall with multi-trauma right proximal  humerus fracture and right comminuted femur fracture.   PROCEDURE:  Right femoral antegrade locking nail Synthes, proximal-and-  distal interlock.  Nail 400 mm x 13 mm.   DESCRIPTION OF PROCEDURE:  After adequate induction of general  anesthesia, the patient was placed on the fracture table with a well leg  holder and center post.  Preoperative antibiotics given.  The patient  had a traumatic Foley insertion by Dr. Patsi Sears with difficulty; and  UA was suggestive of UTI.  Preoperative antibiotics had been given.  Standard prep and draping was performed after the C-arm showed the femur  was out to length.  The area was squared with towels; and a staple was  used not catching the skin; and then the large shower curtain drape was  applied.  Incision was made proximal to the probe.  Gluteus medius  fascia was split; piriformis fossa palpated, drilled and then over  reamed to start the entry hole.   A ball-tip rod was placed across with some difficulty following the long  spiral comminution.  Fracture basically went from subtrochanteric region  all the way almost down to the supracondylar region.  Once the rod was  all the way across down to the knee in AP and lateral measurements  showed 400 mm.  Rod was inserted; the tip of the rod was at the tip of  the trochanter, and the piriformis fossa femoral neck was intact.  AP  and lateral was used to check the femoral region after the intertroc was  placed.   A proximal interlock screw was placed.  Freehand technique was used to  remove  the two distal interlocks.  The fracture was checked; and was out  to length, good rotation.  Instrument count and needle count was  correct.  After irrigation, the fascia was closed with #0 Vicryl in the  gluteus medius fascia, 2-0 Vicryl in the subcutaneous tissue, and skin  staple closure.  Marcaine infiltration and postop dressings.  The sponge  and needle counts were correct.      Mark C. Ophelia Charter, M.D.  Electronically Signed     MCY/MEDQ  D:  06/13/2006  T:  06/14/2006  Job:  [id number]

## 2010-07-31 NOTE — Discharge Summary (Signed)
NAMESIYONA, [name] [name]               ACCOUNT NO.:  [id number]   MEDICAL RECORD NO.:  [id number]          PATIENT TYPE:  INP   LOCATION:  5001                         FACILITY:  [facility]   PHYSICIAN:  Sigmund I. Patsi Sears, M.D.DATE OF BIRTH:  12/27/1940   DATE OF ADMISSION:  06/13/2006  DATE OF DISCHARGE:                               DISCHARGE SUMMARY   HISTORY OF PRESENT ILLNESS:  This 70 year old white female, from  [place], [place].  The patient fell and broke her right arm, and  right leg this morning and is preop surgery this p.m.  The patient had  Foley catheter attempt x2 without success.   The patient admits to a long history of poor voiding, with mixed stress  and urge incontinence.  There is no prior urologic evaluation.   ALLERGIES:  None.   MEDICATIONS:  Albuterol.   REVIEW OF SYSTEMS:  Significant for chronic cough and incontinence. Pt  deniesw all other ROS.   PAST MEDICAL HISTORY:  1. Hypertension.  2. Peptic ulcer disease.  3. VATS.   FAMILY HISTORY:  Mother and father both deceased.  The patient lives by  herself in [place].   SOCIAL HISTORY:  Tobacco 50-pack year history and alcohol is none.   PHYSICAL EXAMINATION:  GENERAL:  Shows a thin, well-developed female in  no acute distress.  VITAL SIGNS:  Her blood pressure is 81/55, pulse is 100 and regular,  temperature is 96.3.  NECK:  Supple, nontender.  CHEST:  Clear to percussion and auscultation.  ABDOMEN:  Soft.  Positive bowel sounds without organomegaly or masses.  There is a splint on the right leg, and on the right arm.  GENITOURINARY:  Shows atrophic urethritis and vaginitis.  The patient  has a very shallow and atreptic vagina.  The urethra was identified.  An  18-French Foley catheter was passed and 100 mL of concentrated urine  were obtained and 10 mL were placed in a balloon.  EXTREMITIES:  No cyanosis or edema.  As noted, the right leg is  immobilized.  PSYCHOLOGIC:  Normal to time,  person and place.   IMPRESSION:  1. Urinary retention  2. Atrophic uranitis and vaginitis.  3. Mixed stress and urge incontinence.   PLAN:  The patient should return for outpatient followup in  approximately 3 months after recovery from her hip surgery.      Sigmund I. Patsi Sears, M.D.  Electronically Signed     SIT/MEDQ  D:  06/13/2006  T:  06/14/2006  Job:  [id number]

## 2010-07-31 NOTE — Op Note (Signed)
NAMEMAKENLI, DERSTINE NO.:  [id number]   MEDICAL RECORD NO.:  [id number]          PATIENT TYPE:  EMS   LOCATION:  MAJO                         FACILITY:  [facility]   PHYSICIAN:  Kathlee Nations Trigt III, M.D.DATE OF BIRTH:  Jan 10, 1941   DATE OF PROCEDURE:  06/05/2004  DATE OF DISCHARGE:                                 OPERATIVE REPORT   OPERATION:  Right chest tube placement (tube thoracostomy).   SURGEON:  Kerin Perna, M.D.   PREOPERATIVE DIAGNOSIS:  Fifty percent spontaneous right pneumothorax.   POSTOPERATIVE DIAGNOSIS:  Fifty percent spontaneous right pneumothorax.   ANESTHESIA:  Local 1% lidocaine.   INDICATIONS:  The patient is a 70 year old female smoker with COPD, who  presented with the first episode of a spontaneous pneumothorax of the right  side.  A chest x-ray demonstrated a 50% right pneumothorax.  A chest tube  was recommend the patient and the procedure was explained.  She agreed under  informed consent.   Prior to the operation, the correct side correct and correct patient were  documented with the patient, nurse and myself.   PROCEDURE:  The right chest was prepped and draped as a sterile field.  One  percent local lidocaine was infiltrated in the 6th interspace and the  inframammary area crease.  A 20-French chest tube was placed over a trocar  and positioned in the right hemithorax apex.  It was secured to the skin  with 2 silk sutures.  A sterile dressing was applied. A followup chest x-ray  showed re-expansion of the lung with the tube in good position.  There were  no complications.      PV/MEDQ  D:  06/05/2004  T:  06/05/2004  Job:  [id number]

## 2010-07-31 NOTE — Discharge Summary (Signed)
[name] [name], [name] [name] NO.:  [id number]   MEDICAL RECORD NO.:  [id number]          PATIENT TYPE:  INP   LOCATION:  2002                         FACILITY:  [facility]   PHYSICIAN:  Kerin Perna, M.D.  DATE OF BIRTH:  [birth year]   DATE OF ADMISSION:  06/05/2004  DATE OF DISCHARGE:  06/14/2004                                 DISCHARGE SUMMARY   PRIMARY ADMISSION DIAGNOSES:  1.  Right-sided chest pain.  2.  Shortness of breath.   ADDITIONAL/DISCHARGE DIAGNOSES:  1.  Spontaneous right pneumothorax.  2.  Chronic obstructive pulmonary disease/emphysema.  3.  Status post myocardial infarction five years ago, treated medically.  4.  Long history of tobacco abuse.   PROCEDURES PERFORMED:  Placement of right chest tube.   HISTORY:  The patient is a 70 year old white female with a long history of  tobacco use and COPD.  She presented to the emergency department on the date  of this admission complaining of a one to two day history of right-sided  back pain and chest pain.  She had some associated shortness of breath as  well.  Her symptoms significantly worsened on the night of presentation.  She was seen in the ER and found to have an O2 saturation of 90 to 92% on  room air.  A chest x-ray showed a 15% right pneumothorax.  There was no  evidence for trauma to the chest and no evidence of pulmonary masses on x-  ray.  Because of her pneumothorax, a cardiothoracic surgery consultation was  obtained.  Dr. Donata Clay saw the patient in the ER and placed a right-sided  chest tube.  Following placement of chest tube, she was admitted for further  evaluation and treatment.   HOSPITAL COURSE:  Initially, her chest tube was placed to suction and her  pneumothorax improved significantly to around 5 to 10% by x-ray.  She was  decreased to water seal, however, she developed an air leak and was placed  back on suction.  Her repeat x-ray's have remained stable with only a slight  right apical pneumothorax.  Her air leak has slowly resolved and on June 12, 2004, she was placed back on water seal.  She will have a followup x-ray  on the morning of June 13, 2004, and if her pneumothorax has remained stable  or has completely resolved and no air leak is present on physical  examination, her chest tube will hopefully be removed.  She has done well  overall during her admission.  She has been counseled regarding smoking  cessation and given information on quitting, as well as use of the 21 mg  nicotine patch.  She was also found on admission labs to have an urinary  tract infection which was asymptomatic and was placed on Cipro.  She has  presently completed a six day course and will hopefully complete a seven day  course total and this will be discontinued.  Otherwise, she has remained  stable.  Her O2 saturations have remained greater than 90% on room air.  She  has been  afebrile and all vital signs have been stable throughout her  admission.  Her chest tube site is clean and dry.  Her lungs are clear,  although breath sounds are slightly decreased on the right.  Otherwise, her  physical examination has been within normal limits.  Her labs on admission  show a hemoglobin of 14.1, hematocrit 42.3, white count 9.3, platelets 101,  with a chemistry panel showing sodium of 139, potassium 3.7, BUN 10,  creatinine 0.8.  It is anticipated that if her chest tube can removed on  June 13, 2004, and her followup chest x-ray shows no evidence of residual  pneumothorax, she will most likely be ready for discharge home on June 14, 2004.   DISCHARGE MEDICATIONS:  1.  Vicodin one to two q.4h. p.r.n. pain.  2.  Nicotine patch 21 mg daily.  3.  Albuterol inhaler 2 puffs b.i.d. and q.4h. p.r.n.   DISCHARGE INSTRUCTIONS:  1.  She is asked to refrain from heavy lifting or strenuous activity.  2.  She is asked to continue her incentive spirometry and ambulate daily.  3.  She will  continue her same preoperative diet.  4.  She may shower and clean her incision sites with soap and water.   FOLLOWUP:  She will see Dr. Donata Clay back in the office on June 24, 2004,  at 2:15 p.m.  She was asked to have a chest x-ray at [facility] one hour prior to this appointment, and should bring her films to Dr.  Donata Clay to review.  In the interim, if she experiences any further chest  pain, shortness of breath, fever greater than 101, redness, swelling, or  drainage from the incision site, she is asked to contact our office  immediately.      GC/MEDQ  D:  06/12/2004  T:  06/13/2004  Job:  [id number]   cc:   Dr. Leatrice Jewels, [place]

## 2010-07-31 NOTE — H&P (Signed)
[name] [name], [name] [name] NO.:  [id number]   MEDICAL RECORD NO.:  [id number]          PATIENT TYPE:  EMS   LOCATION:  MAJO                         FACILITY:  [facility]   PHYSICIAN:  Kathlee Nations Trigt III, M.D.DATE OF BIRTH:  Nov 18, 1940   DATE OF ADMISSION:  06/05/2004  DATE OF DISCHARGE:                                HISTORY & PHYSICAL   ADDITIONAL DIAGNOSES:  Spontaneous right pneumothorax.   CHIEF COMPLAINT:  Right back and chest pain.   HISTORY OF PRESENT ILLNESS:  [name] [name] is a 70 year old female smoker with  COPD who has had right back and chest pain and some shortness of breath for  the past 1-2 days, it became worse on the night of presentation at [facility]  emergency room.  Her oxygen saturation was 90-92% on room air, and a chest x-  ray showed a 50% right pneumothorax.  There was no evidence of pulmonary  masses.  She denies any trauma to the chest or falls.  She has never had a  spontaneous pneumothorax before.  She has not been admitted to the hospital  in the recent past.   PAST MEDICAL HISTORY:  1.  COPD-emphysema.  2.  Status post myocardial infarction, treated medically five years ago.  3.  No known allergies.   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  She lives by herself.  She smokes to packs of cigarettes a day.  She does not use alcohol.   FAMILY HISTORY:  Negative for spontaneous pneumothorax.   MEDICATIONS:  1.  Albuterol MDI two puffs b.i.d.  2.  Advil for arthritic pain.   REVIEW OF SYSTEMS:  She denies any serious pneumonia or upper respiratory  infection this winter.  She denies hemoptysis.  __________ has been stable.  She denies any bleeding disorders.  She denies stroke.  She did have a  subendocardial MI in the past, treated medically.   PHYSICAL EXAMINATION:  VITAL SIGNS:  Temperature 98.4, blood pressure  100/50, pulse 94 sinus, saturation 93% now on 3 liters.  HEENT:  Edentulous,  normocephalic, __________ no palpable adenopathy.  LUNGS:  Diminished breath sounds of the right with increased AP diameter.  ABDOMINAL:  Soft, without pulsatile mass.  CARDIAC:  A sinus rhythm without murmur.  EXTREMITIES:  No edema or cyanosis with mild clubbing and fingers.  NEUROLOGICAL:  All focal deficit.   LABORATORY DATA:  Chest x-ray shows large 50% pneumothorax.  She has  significant COPD with hyperinflated lung fields.  Her hematocrit is 42,  white count 9000, glucose 127, potassium 3.7.   IMPRESSION:  Right spontaneous pneumothorax.   PLAN:  A right chest tube will be placed, and the patient will be admitted  for treatment and observation.      PV/MEDQ  D:  06/05/2004  T:  06/05/2004  Job:  [id number]

## 2010-07-31 NOTE — Discharge Summary (Signed)
NAMECYNETHIA, SCHINDLER NO.:  [id number]   MEDICAL RECORD NO.:  [id number]          PATIENT TYPE:  INP   LOCATION:  2001                         FACILITY:  [facility]   PHYSICIAN:  Kerin Perna, M.D.  DATE OF BIRTH:  1940-05-19   DATE OF ADMISSION:  06/05/2004  DATE OF DISCHARGE:  06/20/2004                                 DISCHARGE SUMMARY   ADDENDUM:   ADDITIONAL PROCEDURE PERFORMED:  Right VATS with stapling of right apical  bleb and mechanical pleurodesis.   Ms. Crounse was originally scheduled for discharge home on June 14, 2004.  However, on that morning's chest x-ray, she was noted to have a persistent  pneumothorax with a persistent air leak of her chest tube.  Because of this,  Dr. Donata Clay felt that she would require a right VATS with stapling of  blebs in order to resolve this problem.  She remained in the hospital and  her chest tube was continued to suction.  She was taken to the operating  room on June 16, 2004, and underwent a right VATS with stapling of two  apical blebs and mechanical pleurodesis.  She tolerated the procedure well  and was returned to the floor in stable condition.  Postoperatively, she has  improved significantly.  Her chest x-rays have shown no evidence of  pneumothorax and she had no air leak from her chest tube.  Her chest tube  was left in for 48 hours and placed to water-seal.  When she continued to  have no evidence of air leak or pneumothorax, her chest tube was removed on  June 19, 2004.  Initial follow up chest x-ray has been clear and no evidence  of pneumothorax is seen.  She is scheduled to have a PA and lateral chest x-  ray on the morning of June 20, 2004.  Otherwise, she has done well  postoperatively.  Her postoperative labs have remained stable with  hemoglobin 11.9, hematocrit 36.5, platelets 147, white count 10.6.  Electrolytes are all within normal limits.  She is ambulating without  difficulty.  Her O2  saturations have remained greater than 90% on room air.  She has been afebrile and all vital signs have been stable.  She is voiding  without difficulty.  She is tolerating a regular diet.  If her x-ray on the  morning of June 20, 2004, showed no evidence of pneumothorax, she will be  discharged home at that time.  The remainder of her discharge instructions,  medications, and other orders are unchanged from her previously dictated  discharge summary with the exception of her appointment with Dr. Donata Clay  which will now be on April 21 at 10:15 a.m.  She will have a chest x-ray at  9:15 a.m. at the [facility].  Otherwise, her instructions  are unchanged.      GC/MEDQ  D:  06/19/2004  T:  06/19/2004  Job:  [id number]   cc:   Dr. Leatrice Jewels, 

## 2010-07-31 NOTE — Op Note (Signed)
NAMESHELL, BLANCHETTE NO.:  [id number]   MEDICAL RECORD NO.:  [id number]          PATIENT TYPE:  INP   LOCATION:  3399                         FACILITY:  [facility]   PHYSICIAN:  Kathlee Nations Trigt III, M.D.DATE OF BIRTH:  05-Jun-1940   DATE OF PROCEDURE:  06/16/2004  DATE OF DISCHARGE:                                 OPERATIVE REPORT   OPERATION:  Right VATS (video-assisted thoracoscopic surgery), stapling of  right apical blebs, pleurodesis.   PREOPERATIVE DIAGNOSES:  1.  Spontaneous right pneumothorax with persistent air leak.  2.  Right apical blebs.   POSTOPERATIVE DIAGNOSES:  1.  Spontaneous right pneumothorax with persistent air leak.  2.  Right apical blebs.   SURGEON:  Kerin Perna, M.D.   ASSISTANT:  Maxwell Marion, N.P.-C   ANESTHESIA:  General.   INDICATIONS:  The patient is a 70 year old smoker who presented with first  onset of spontaneous pneumothorax with a 50% to 60% pneumothorax collapse of  her right lung.  A chest tube was placed and positioned properly to the  apex.  The lung re-expanded; however, there was a persistent air leak which  did not resolve after 8 days.  For that reason, a right VATS was recommended  to the patient.  A preoperative chest CT scan was performed which showed  apical blebs, no evidence of neoplastic disease.   Prior to surgery, I discussed the procedure right VATS and stabling of blebs  and pleurodesis with the patient.  I discussed the major aspects of the  planned procedure including the use of general anesthesia, location of the  surgical incisions, use of a new postoperative chest tube drainage system,  and the expected postoperative recovery.  She understood the associated  risks of pain, infection, bleeding, persistent air leak, and the risk for  recurrent pneumothorax.  She understood these aspects and agreed to proceed  with operation as planned under what I felt was an informed consent.   PROCEDURE:   The patient was brought to operating room and placed on the  operating table where general anesthesia was induced.  A double-lumen  endotracheal tube was positioned and confirmed by bronchoscopic exam.  The  patient was then turned to expose the right chest.  The right chest tube  previously placed was removed.  The right chest was prepped and draped as a  sterile field.  Three VATS portal incisions were made in the anterior  axillary line in the 5th interspace, in the posterior 5th interspace to the  scapula and beneath the tip of the scapula the 6th interspace.  The VATS  camera was inserted the lung was inspected.  There blebs at the apex in the  medial aspect.  There were some old adhesions at the apex as well.  There  was no evidence of neoplastic disease.  Using the Endo-GIA staplers, 2  specimens of apical blebs were removed from the right lung apex.  The staple  line was inspected and found to be intact without bleeding.  A pleurodesis  was performed using the scratch pad of the electrocautery unit  circumferentially from the apex to the lower aspect of the right hemithorax.  Some of the pleural tissue was stripped as well.  Under direct vision, a  chest tube was placed and directed to right lung apex and secured to the  skin.  The lungs were then re-expanded under visualization.  There was good  re-expansion of the lungs.  The VATS portal incisions were then closed in  layers using Vicryl.  Sterile dressings were applied.  The old chest tube  site was debrided and closed with interrupted nylon suture.  The patient was  extubated and returned to the recovery in stable condition.      PV/MEDQ  D:  06/16/2004  T:  06/17/2004  Job:  [id number]   cc:   Reuel Derby   [facility]

## 2011-10-12 ENCOUNTER — Emergency Department (HOSPITAL_COMMUNITY): Payer: Medicare Other

## 2011-10-12 ENCOUNTER — Emergency Department (HOSPITAL_COMMUNITY)
Admission: EM | Admit: 2011-10-12 | Discharge: 2011-10-12 | Disposition: A | Discharge status: Home / Self Care | Payer: Medicare Other | Attending: Emergency Medicine | Admitting: Emergency Medicine

## 2011-10-12 ENCOUNTER — Encounter (HOSPITAL_COMMUNITY): Payer: Self-pay | Admitting: *Deleted

## 2011-10-12 DIAGNOSIS — W010XXA Fall on same level from slipping, tripping and stumbling without subsequent striking against object, initial encounter: Secondary | ICD-10-CM | POA: Insufficient documentation

## 2011-10-12 DIAGNOSIS — N390 Urinary tract infection, site not specified: Secondary | ICD-10-CM

## 2011-10-12 DIAGNOSIS — S0180XA Unspecified open wound of other part of head, initial encounter: Secondary | ICD-10-CM | POA: Insufficient documentation

## 2011-10-12 DIAGNOSIS — I10 Essential (primary) hypertension: Secondary | ICD-10-CM | POA: Insufficient documentation

## 2011-10-12 DIAGNOSIS — S0003XA Contusion of scalp, initial encounter: Secondary | ICD-10-CM | POA: Insufficient documentation

## 2011-10-12 DIAGNOSIS — S52121A Displaced fracture of head of right radius, initial encounter for closed fracture: Secondary | ICD-10-CM

## 2011-10-12 DIAGNOSIS — S0083XA Contusion of other part of head, initial encounter: Secondary | ICD-10-CM | POA: Insufficient documentation

## 2011-10-12 DIAGNOSIS — R079 Chest pain, unspecified: Secondary | ICD-10-CM | POA: Insufficient documentation

## 2011-10-12 DIAGNOSIS — M949 Disorder of cartilage, unspecified: Secondary | ICD-10-CM | POA: Insufficient documentation

## 2011-10-12 DIAGNOSIS — S52123A Displaced fracture of head of unspecified radius, initial encounter for closed fracture: Secondary | ICD-10-CM | POA: Insufficient documentation

## 2011-10-12 DIAGNOSIS — M899 Disorder of bone, unspecified: Secondary | ICD-10-CM | POA: Insufficient documentation

## 2011-10-12 HISTORY — DX: Essential (primary) hypertension: I10

## 2011-10-12 LAB — CK: Total CK: 64 U/L (ref 7–177)

## 2011-10-12 LAB — CBC
HCT: 43.1 % (ref 36.0–46.0)
Hemoglobin: 13.9 g/dL (ref 12.0–15.0)
MCH: 27.3 pg (ref 26.0–34.0)
MCHC: 32.3 g/dL (ref 30.0–36.0)
RDW: 18.6 % — ABNORMAL HIGH (ref 11.5–15.5)

## 2011-10-12 LAB — URINALYSIS, ROUTINE W REFLEX MICROSCOPIC
Bilirubin Urine: NEGATIVE
Glucose, UA: NEGATIVE mg/dL
Ketones, ur: 40 mg/dL — AB
Nitrite: NEGATIVE
Protein, ur: 100 mg/dL — AB

## 2011-10-12 LAB — POCT I-STAT, CHEM 8
Creatinine, Ser: 0.8 mg/dL (ref 0.50–1.10)
Glucose, Bld: 103 mg/dL — ABNORMAL HIGH (ref 70–99)
HCT: 48 % — ABNORMAL HIGH (ref 36.0–46.0)
Hemoglobin: 16.3 g/dL — ABNORMAL HIGH (ref 12.0–15.0)
Potassium: 3.2 mEq/L — ABNORMAL LOW (ref 3.5–5.1)
TCO2: 27 mmol/L (ref 0–100)

## 2011-10-12 LAB — URINE MICROSCOPIC-ADD ON

## 2011-10-12 MED ORDER — DEXTROSE 5 % IV SOLN
1.0000 g | Freq: Once | INTRAVENOUS | Status: AC
  Administered 2011-10-12: 1 g via INTRAVENOUS
  Filled 2011-10-12: qty 10

## 2011-10-12 MED ORDER — OXYCODONE-ACETAMINOPHEN 5-325 MG PO TABS
1.0000 | ORAL_TABLET | Freq: Once | ORAL | Status: AC
  Administered 2011-10-12: 1 via ORAL
  Filled 2011-10-12: qty 1

## 2011-10-12 MED ORDER — CEPHALEXIN 500 MG PO CAPS: 500.0000 mg | ORAL_CAPSULE | Freq: Three times a day (TID) | ORAL | Status: DC

## 2011-10-12 MED ORDER — OXYCODONE-ACETAMINOPHEN 5-325 MG PO TABS: 1.0000 | ORAL_TABLET | ORAL | Status: AC | PRN

## 2011-10-12 MED ORDER — IBUPROFEN 600 MG PO TABS: 400.0000 mg | ORAL_TABLET | Freq: Four times a day (QID) | ORAL | Status: DC | PRN

## 2011-10-12 MED ORDER — IBUPROFEN 400 MG PO TABS
400.0000 mg | ORAL_TABLET | Freq: Once | ORAL | Status: AC
  Administered 2011-10-12: 400 mg via ORAL
  Filled 2011-10-12: qty 1

## 2011-10-12 MED ORDER — POTASSIUM CHLORIDE CRYS ER 20 MEQ PO TBCR
40.0000 meq | EXTENDED_RELEASE_TABLET | Freq: Once | ORAL | Status: AC
  Administered 2011-10-12: 40 meq via ORAL
  Filled 2011-10-12: qty 2

## 2011-10-12 MED ORDER — SODIUM CHLORIDE 0.9 % IV BOLUS (SEPSIS)
500.0000 mL | Freq: Once | INTRAVENOUS | Status: AC
  Administered 2011-10-12: 500 mL via INTRAVENOUS

## 2011-10-12 NOTE — ED Notes (Signed)
NP at bedside.

## 2011-10-12 NOTE — ED Notes (Signed)
Pt with fall earlier this am after loosing footing. Pt lives at home by herself. Pt with deformity to right wrist, splint in place. Pt was ambulatory at home. Pt on LSB for transfer. Abrasion above right eye. No back or neck pain. 20g(L)AC.

## 2011-10-12 NOTE — ED Notes (Signed)
Ortho paged. NP at bedside suturing laceration above eyebrow.

## 2011-10-12 NOTE — Progress Notes (Signed)
Orthopedic Tech Progress Note Patient Details:  [name] [name] 21-Nov-1940 [id number]  Ortho Devices Type of Ortho Device: Sugartong splint Ortho Device/Splint Location: right UE Ortho Device/Splint Interventions: Application   Krithika Tome T 10/12/2011, 4:14 PM

## 2011-10-12 NOTE — ED Provider Notes (Signed)
History     CSN: [id number]  Arrival date & time 10/12/11  1250   First MD Initiated Contact with Patient 10/12/11 1258      Chief Complaint  Patient presents with  . Fall  . Wrist Pain    (Consider location/radiation/quality/duration/timing/severity/associated sxs/prior treatment) Patient is a 71 y.o. female presenting with fall and wrist pain. The history is provided by the patient and a relative. No language interpreter was used.  Fall The accident occurred 12 to 24 hours ago. The fall occurred while walking. She landed on a hard floor. The volume of blood lost was minimal. The pain is at a severity of 5/10. The pain is moderate. She was ambulatory at the scene. There was no entrapment after the fall. There was no drug use involved in the accident. There was no alcohol use involved in the accident. Pertinent negatives include no visual change, no fever, no numbness, no abdominal pain, no bowel incontinence, no nausea, no vomiting, no hematuria and no headaches. Associated symptoms comments: Unknown LOC or how long she was on the floor. She has tried nothing for the symptoms.  Wrist Pain Pertinent negatives include no abdominal pain, fever, headaches, nausea, neck pain, numbness, visual change, vomiting or weakness.  Son reports that patient fell sometime through the night and called him this am. Patient states that she lost her footing and fell.  C/o pain to R wrist.  Laceration above R eye. Also c/o R rib pain. Denies LOC, dizziness, CP, SOB.  Intermittantly confused which son states is her baseline.  HOH as well.  PCP is Dr. Nathanial Rancher in [place].  She lives alone with her son close by.  pmh MI, hypertension, SOB, asthma, copd, pneumonia, recurrent uti's, migraines, blood clots.  Smoker.    Past Medical History  Diagnosis Date  . Hypertension     History reviewed. No pertinent past surgical history.  History reviewed. No pertinent family history.  History  Substance Use Topics  .  Smoking status: Current Everyday Smoker  . Smokeless tobacco: Not on file  . Alcohol Use: No    OB History    Grav Para Term Preterm Abortions TAB SAB Ect Mult Living                  Review of Systems  Constitutional: Negative.  Negative for fever.  HENT: Negative.  Negative for neck pain.   Eyes: Negative.   Respiratory: Negative.   Cardiovascular: Negative.   Gastrointestinal: Negative.  Negative for nausea, vomiting, abdominal pain, diarrhea and bowel incontinence.  Genitourinary: Negative for dysuria, hematuria, flank pain and pelvic pain.  Musculoskeletal: Negative for back pain.       R rib pain, R wrist pain  Neurological: Negative.  Negative for dizziness, speech difficulty, weakness, numbness and headaches.  Psychiatric/Behavioral: Negative.  Negative for confusion.  All other systems reviewed and are negative.    Allergies  Review of patient's allergies indicates no known allergies.  Home Medications   Current Outpatient Rx  Name Route Sig Dispense Refill  . AMLODIPINE BESYLATE 10 MG PO TABS Oral Take 10 mg by mouth daily.    . ASPIRIN EC 81 MG PO TBEC Oral Take 81 mg by mouth daily.    Marland Kitchen BISACODYL 5 MG PO TBEC Oral Take 5 mg by mouth daily as needed. For constipation    . FUROSEMIDE 20 MG PO TABS Oral Take 10 mg by mouth daily.    . [employer] LEG CRAMPS PAIN RELIEF PO  TABS Oral Take 1 tablet by mouth daily as needed. For leg cramps    . LOPERAMIDE HCL 2 MG PO CAPS Oral Take 2 mg by mouth 4 (four) times daily as needed. For diarrhea.    Marland Kitchen MECLIZINE HCL 25 MG PO TABS Oral Take 25 mg by mouth every 4 (four) hours as needed. For dizziness    . METOPROLOL SUCCINATE ER 50 MG PO TB24 Oral Take 50 mg by mouth daily. Take with or immediately following a meal.    . OMEPRAZOLE 20 MG PO CPDR Oral Take 20 mg by mouth daily.    . TRAMADOL HCL 50 MG PO TABS Oral Take 50-100 mg by mouth every 6 (six) hours as needed. For back pain    . VITAMIN B-12 500 MCG PO TABS Oral Take 500 mcg  by mouth daily.      BP 129/71  Pulse 87  Temp 97.4 F (36.3 C) (Oral)  Resp 16  Ht 5\' 3"  (1.6 m)  Wt 115 lb (52.164 kg)  BMI 20.37 kg/m2  SpO2 96%  Physical Exam  Nursing note and vitals reviewed. Constitutional: She is oriented to person, place, and time. She appears well-developed and well-nourished.  HENT:  Head: Normocephalic.       Laceration to R eyebrow.  Eyes: Conjunctivae and EOM are normal. Pupils are equal, round, and reactive to light.  Neck: Normal range of motion. Neck supple.  Cardiovascular: Normal rate.   Pulmonary/Chest: Effort normal and breath sounds normal. No respiratory distress. She has no wheezes.  Abdominal: Soft. Bowel sounds are normal. She exhibits no distension. There is no tenderness.  Musculoskeletal: Normal range of motion. She exhibits tenderness. She exhibits no edema.       R eyebrow laceration 3cm, R rib pain, R wrist tenderness with limited ROM due to pain.  +CS  Neurological: She is alert and oriented to person, place, and time. She has normal reflexes.  Skin: Skin is warm and dry.  Psychiatric: She has a normal mood and affect.    ED Course  Procedures (including critical care time)  Labs Reviewed  POCT I-STAT, CHEM 8 - Abnormal; Notable for the following:    Potassium 3.2 (*)     Glucose, Bld 103 (*)     Calcium, Ion 1.10 (*)     Hemoglobin 16.3 (*)     HCT 48.0 (*)     All other components within normal limits  URINALYSIS, ROUTINE W REFLEX MICROSCOPIC  CBC   Dg Ribs Unilateral W/chest Right  10/12/2011  *RADIOLOGY REPORT*  Clinical Data: Fall and right lower rib pain.  RIGHT RIBS AND CHEST - 3+ VIEW  Comparison: Chest radiograph 05/06/2010  Findings: Chest radiograph demonstrates chronic changes in the upper lungs, right side greater than left.  However, there are increased vague densities in the upper lungs and cannot exclude areas of nodularity.   Postsurgical changes in the right upper lung.  Heart and mediastinum are stable.   Trachea is midline.  No evidence for pneumothorax.  There may be an old right fourth rib fracture.  There is concern for a nondisplaced fracture involving the right seventh rib. Evidence for an old proximal right humeral fracture.  IMPRESSION: Question a nondisplaced fracture involving the right seventh rib.  Old right fourth rib fracture and old right proximal humeral fracture.  Increased vague densities in the upper lungs, right side greater than left.  Some of these findings are related to chronic changes and postsurgical changes.  However, new areas of nodularity cannot be excluded.  Recommend close follow-up with PA and lateral chest views or a chest CT for further evaluation.  Original Report Authenticated By: Richarda Overlie, M.D.   Dg Wrist Complete Right  10/12/2011  *RADIOLOGY REPORT*  Clinical Data: Fall and wrist pain.  RIGHT WRIST - COMPLETE 3+ VIEW  Comparison: None.  Findings: Four views of the wrist demonstrate a displaced fracture involving the distal radius.  There is volar angulation of the distal fragment.  It is unclear if the fracture involves the wrist articulating surface.  The distal ulna appears to be intact and there is diffuse osteopenia. Carpal bones are intact.  IMPRESSION: Displaced fracture of the distal right radius.  Original Report Authenticated By: Richarda Overlie, M.D.   Dg Ankle Complete Right  10/12/2011  *RADIOLOGY REPORT*  Clinical Data: Fall and scratches to the ankle.  RIGHT ANKLE - COMPLETE 3+ VIEW  Comparison: None.  Findings: Three views of the right ankle were obtained.  There is diffuse osteopenia.  Negative for acute fracture or dislocation. There are calcaneal spurs.  Difficult to exclude mild soft tissue swelling.  IMPRESSION: Diffuse osteopenia without acute bony abnormality.  Original Report Authenticated By: Richarda Overlie, M.D.   Ct Head Wo Contrast  10/12/2011  *RADIOLOGY REPORT*  Clinical Data: Fall.  Right supraorbital abrasions and contusions. No loss of  consciousness.  CT HEAD WITHOUT CONTRAST  Technique:  Contiguous axial images were obtained from the base of the skull through the vertex without contrast.  Comparison: CT head without contrast 04/05/2007 at [facility].  Findings: Mild generalized atrophy is present.  No acute infarct, hemorrhage, or mass lesion is present.  The ventricles are normal size.  Mild periventricular white matter hypoattenuation is similar to the prior study.  There is scattered subcortical disease as well.  The visualized paranasal sinuses and mastoid air cells are clear. The osseous skull is intact.  No significant extra cranial soft tissue injury is evident.  No skull fracture is identified.  IMPRESSION:  1.  No acute intracranial abnormality or significant interval change. 2.  Stable atrophy and white matter disease.  Original Report Authenticated By: Jamesetta Orleans. MATTERN, M.D.     No diagnosis found.    MDM  Larey Seat through the night sustaining lac above R eye, fx 7th R rib, and R wrist fx.  +UTI with 21-50 wbc recieved rocephen 1gm IV in the ER and sent home on keflex. Culture pending.   Will follow up tomorrow with Dr. Ophelia Charter in the office for wrist fx and possible surgery. Sugar tong splint applied.  CT head unremarkable.  R ankle film unremarkable.  Hypokalemia 3.2 treated. Return to ER if worse.  Follow up with pcp for rib fx and suture removal this week.        Remi Haggard, NP 10/13/11 1251

## 2011-10-13 ENCOUNTER — Ambulatory Visit (HOSPITAL_COMMUNITY): Payer: Medicare Other | Admitting: Anesthesiology

## 2011-10-13 ENCOUNTER — Encounter (HOSPITAL_COMMUNITY): Payer: Self-pay | Admitting: Anesthesiology

## 2011-10-13 ENCOUNTER — Ambulatory Visit (HOSPITAL_COMMUNITY): Payer: Medicare Other

## 2011-10-13 ENCOUNTER — Other Ambulatory Visit: Payer: Self-pay

## 2011-10-13 ENCOUNTER — Observation Stay (HOSPITAL_COMMUNITY)
Admission: AD | Admit: 2011-10-13 | Discharge: 2011-10-15 | Disposition: A | Discharge status: Home / Self Care | Payer: Medicare Other | Source: Ambulatory Visit | Attending: Orthopaedic Surgery | Admitting: Orthopaedic Surgery

## 2011-10-13 ENCOUNTER — Other Ambulatory Visit (HOSPITAL_COMMUNITY): Payer: Self-pay | Admitting: Orthopaedic Surgery

## 2011-10-13 ENCOUNTER — Encounter (HOSPITAL_COMMUNITY)
Admission: AD | Disposition: A | Discharge status: Home / Self Care | Payer: Self-pay | Source: Ambulatory Visit | Attending: Orthopaedic Surgery

## 2011-10-13 ENCOUNTER — Encounter (HOSPITAL_COMMUNITY): Payer: Self-pay | Admitting: *Deleted

## 2011-10-13 DIAGNOSIS — W19XXXA Unspecified fall, initial encounter: Secondary | ICD-10-CM | POA: Insufficient documentation

## 2011-10-13 DIAGNOSIS — S52599A Other fractures of lower end of unspecified radius, initial encounter for closed fracture: Principal | ICD-10-CM | POA: Insufficient documentation

## 2011-10-13 DIAGNOSIS — I10 Essential (primary) hypertension: Secondary | ICD-10-CM | POA: Insufficient documentation

## 2011-10-13 DIAGNOSIS — S52501A Unspecified fracture of the lower end of right radius, initial encounter for closed fracture: Secondary | ICD-10-CM

## 2011-10-13 DIAGNOSIS — K219 Gastro-esophageal reflux disease without esophagitis: Secondary | ICD-10-CM | POA: Insufficient documentation

## 2011-10-13 DIAGNOSIS — J4489 Other specified chronic obstructive pulmonary disease: Secondary | ICD-10-CM | POA: Insufficient documentation

## 2011-10-13 DIAGNOSIS — J449 Chronic obstructive pulmonary disease, unspecified: Secondary | ICD-10-CM | POA: Insufficient documentation

## 2011-10-13 DIAGNOSIS — Y92009 Unspecified place in unspecified non-institutional (private) residence as the place of occurrence of the external cause: Secondary | ICD-10-CM | POA: Insufficient documentation

## 2011-10-13 DIAGNOSIS — R51 Headache: Secondary | ICD-10-CM | POA: Insufficient documentation

## 2011-10-13 HISTORY — DX: Gastro-esophageal reflux disease without esophagitis: K21.9

## 2011-10-13 HISTORY — DX: Headache: R51

## 2011-10-13 HISTORY — DX: Chronic obstructive pulmonary disease, unspecified: J44.9

## 2011-10-13 HISTORY — DX: Shortness of breath: R06.02

## 2011-10-13 HISTORY — DX: Chronic kidney disease, unspecified: N18.9

## 2011-10-13 HISTORY — DX: Pneumonia, unspecified organism: J18.9

## 2011-10-13 HISTORY — DX: Unspecified asthma, uncomplicated: J45.909

## 2011-10-13 HISTORY — PX: ORIF WRIST FRACTURE: SHX2133

## 2011-10-13 HISTORY — DX: Unspecified osteoarthritis, unspecified site: M19.90

## 2011-10-13 HISTORY — DX: Disease of blood and blood-forming organs, unspecified: D75.9

## 2011-10-13 LAB — URINE CULTURE: Colony Count: >=100000

## 2011-10-13 LAB — BASIC METABOLIC PANEL
Chloride: 99 mEq/L (ref 96–112)
GFR calc Af Amer: >90 mL/min (ref >90)
Potassium: 3.6 mEq/L (ref 3.5–5.1)
Sodium: 136 mEq/L (ref 135–145)

## 2011-10-13 LAB — CBC
HCT: 42.2 % (ref 36.0–46.0)
Hemoglobin: 13.4 g/dL (ref 12.0–15.0)
WBC: 7.7 10*3/uL (ref 4.0–10.5)

## 2011-10-13 LAB — PROTIME-INR
INR: 0.94 (ref 0.00–1.49)
Prothrombin Time: 12.8 seconds (ref 11.6–15.2)

## 2011-10-13 LAB — SURGICAL PCR SCREEN: MRSA, PCR: NEGATIVE

## 2011-10-13 SURGERY — OPEN REDUCTION INTERNAL FIXATION (ORIF) WRIST FRACTURE
Anesthesia: General | Site: Wrist | Laterality: Right | Wound class: Clean

## 2011-10-13 MED ORDER — TRAMADOL HCL 50 MG PO TABS
50.0000 mg | ORAL_TABLET | Freq: Four times a day (QID) | ORAL | Status: DC | PRN
  Administered 2011-10-14 – 2011-10-15 (×2): 50 mg via ORAL
  Filled 2011-10-13 (×2): qty 1

## 2011-10-13 MED ORDER — DEXTROMETHORPHAN HBR 15 MG/5ML PO SYRP: 15.0000 mL | ORAL_SOLUTION | Freq: Four times a day (QID) | ORAL | Status: DC | PRN

## 2011-10-13 MED ORDER — ONDANSETRON HCL 4 MG/2ML IJ SOLN: 4.0000 mg | Freq: Four times a day (QID) | INTRAMUSCULAR | Status: DC | PRN

## 2011-10-13 MED ORDER — METOCLOPRAMIDE HCL 5 MG/ML IJ SOLN: 5.0000 mg | Freq: Three times a day (TID) | INTRAMUSCULAR | Status: DC | PRN

## 2011-10-13 MED ORDER — CEFAZOLIN SODIUM-DEXTROSE 2-3 GM-% IV SOLR
2.0000 g | INTRAVENOUS | Status: AC
  Administered 2011-10-13: 2 g via INTRAVENOUS
  Filled 2011-10-13: qty 50

## 2011-10-13 MED ORDER — METHOCARBAMOL 100 MG/ML IJ SOLN
500.0000 mg | Freq: Four times a day (QID) | INTRAVENOUS | Status: DC | PRN
  Administered 2011-10-13: 500 mg via INTRAVENOUS
  Filled 2011-10-13: qty 5

## 2011-10-13 MED ORDER — BUPIVACAINE HCL (PF) 0.25 % IJ SOLN
INTRAMUSCULAR | Status: DC | PRN
  Administered 2011-10-13: 10 mL

## 2011-10-13 MED ORDER — LOPERAMIDE HCL 2 MG PO CAPS
2.0000 mg | ORAL_CAPSULE | Freq: Four times a day (QID) | ORAL | Status: DC | PRN
  Filled 2011-10-13: qty 1

## 2011-10-13 MED ORDER — FESOTERODINE FUMARATE ER 4 MG PO TB24
4.0000 mg | ORAL_TABLET | Freq: Every day | ORAL | Status: DC
  Administered 2011-10-13 – 2011-10-14 (×2): 4 mg via ORAL
  Filled 2011-10-13 (×3): qty 1

## 2011-10-13 MED ORDER — METOCLOPRAMIDE HCL 10 MG PO TABS: 5.0000 mg | ORAL_TABLET | Freq: Three times a day (TID) | ORAL | Status: DC | PRN

## 2011-10-13 MED ORDER — BISACODYL 10 MG RE SUPP: 10.0000 mg | Freq: Every day | RECTAL | Status: DC | PRN

## 2011-10-13 MED ORDER — BUPIVACAINE HCL (PF) 0.25 % IJ SOLN
INTRAMUSCULAR | Status: AC
  Filled 2011-10-13: qty 30

## 2011-10-13 MED ORDER — ROCURONIUM BROMIDE 100 MG/10ML IV SOLN
INTRAVENOUS | Status: DC | PRN
  Administered 2011-10-13: 25 mg via INTRAVENOUS

## 2011-10-13 MED ORDER — ONDANSETRON HCL 4 MG/2ML IJ SOLN
INTRAMUSCULAR | Status: DC | PRN
  Administered 2011-10-13: 4 mg via INTRAVENOUS

## 2011-10-13 MED ORDER — VITAMIN B-12 500 MCG PO TABS: 500.0000 ug | ORAL_TABLET | Freq: Every day | ORAL | Status: DC

## 2011-10-13 MED ORDER — 0.9 % SODIUM CHLORIDE (POUR BTL) OPTIME
TOPICAL | Status: DC | PRN
  Administered 2011-10-13: 1000 mL

## 2011-10-13 MED ORDER — AMLODIPINE BESYLATE 10 MG PO TABS
10.0000 mg | ORAL_TABLET | Freq: Every day | ORAL | Status: DC
  Administered 2011-10-14 – 2011-10-15 (×2): 10 mg via ORAL
  Filled 2011-10-13 (×2): qty 1

## 2011-10-13 MED ORDER — ATORVASTATIN CALCIUM 20 MG PO TABS
20.0000 mg | ORAL_TABLET | Freq: Every day | ORAL | Status: DC
  Administered 2011-10-13: 20 mg via ORAL
  Filled 2011-10-13 (×3): qty 1

## 2011-10-13 MED ORDER — LIDOCAINE HCL (CARDIAC) 20 MG/ML IV SOLN
INTRAVENOUS | Status: DC | PRN
  Administered 2011-10-13: 60 mg via INTRAVENOUS

## 2011-10-13 MED ORDER — NEOSTIGMINE METHYLSULFATE 1 MG/ML IJ SOLN
INTRAMUSCULAR | Status: DC | PRN
  Administered 2011-10-13: 3 mg via INTRAVENOUS

## 2011-10-13 MED ORDER — MECLIZINE HCL 25 MG PO TABS
25.0000 mg | ORAL_TABLET | ORAL | Status: DC | PRN
  Filled 2011-10-13: qty 1

## 2011-10-13 MED ORDER — KCL IN DEXTROSE-NACL 20-5-0.45 MEQ/L-%-% IV SOLN
INTRAVENOUS | Status: DC
  Administered 2011-10-14: 04:00:00 via INTRAVENOUS
  Filled 2011-10-13 (×5): qty 1000

## 2011-10-13 MED ORDER — DEXTROMETHORPHAN POLISTIREX 30 MG/5ML PO LQCR
45.0000 mg | ORAL | Status: DC | PRN
  Filled 2011-10-13: qty 10

## 2011-10-13 MED ORDER — DOCUSATE SODIUM 100 MG PO CAPS
100.0000 mg | ORAL_CAPSULE | Freq: Two times a day (BID) | ORAL | Status: DC
  Administered 2011-10-13 – 2011-10-15 (×4): 100 mg via ORAL
  Filled 2011-10-13 (×5): qty 1

## 2011-10-13 MED ORDER — CEFAZOLIN SODIUM 1-5 GM-% IV SOLN
1.0000 g | Freq: Four times a day (QID) | INTRAVENOUS | Status: AC
  Administered 2011-10-13 – 2011-10-14 (×3): 1 g via INTRAVENOUS
  Filled 2011-10-13 (×3): qty 50

## 2011-10-13 MED ORDER — FENTANYL CITRATE 0.05 MG/ML IJ SOLN
INTRAMUSCULAR | Status: DC | PRN
  Administered 2011-10-13: 50 ug via INTRAVENOUS

## 2011-10-13 MED ORDER — PHENYLEPHRINE HCL 10 MG/ML IJ SOLN
INTRAMUSCULAR | Status: DC | PRN
  Administered 2011-10-13: 80 ug via INTRAVENOUS
  Administered 2011-10-13: 40 ug via INTRAVENOUS

## 2011-10-13 MED ORDER — OXYCODONE-ACETAMINOPHEN 5-325 MG PO TABS
1.0000 | ORAL_TABLET | ORAL | Status: DC | PRN
  Administered 2011-10-14: 1 via ORAL
  Filled 2011-10-13: qty 1

## 2011-10-13 MED ORDER — LACTATED RINGERS IV SOLN
INTRAVENOUS | Status: DC | PRN
  Administered 2011-10-13 (×2): via INTRAVENOUS

## 2011-10-13 MED ORDER — DIPHENHYDRAMINE HCL 12.5 MG/5ML PO ELIX: 12.5000 mg | ORAL_SOLUTION | ORAL | Status: DC | PRN

## 2011-10-13 MED ORDER — ZOLPIDEM TARTRATE 5 MG PO TABS: 5.0000 mg | ORAL_TABLET | Freq: Every evening | ORAL | Status: DC | PRN

## 2011-10-13 MED ORDER — ASPIRIN EC 81 MG PO TBEC
81.0000 mg | DELAYED_RELEASE_TABLET | Freq: Every day | ORAL | Status: DC
  Administered 2011-10-14 – 2011-10-15 (×2): 81 mg via ORAL
  Filled 2011-10-13 (×2): qty 1

## 2011-10-13 MED ORDER — EPHEDRINE SULFATE 50 MG/ML IJ SOLN
INTRAMUSCULAR | Status: DC | PRN
  Administered 2011-10-13 (×4): 10 mg via INTRAVENOUS
  Administered 2011-10-13 (×2): 5 mg via INTRAVENOUS

## 2011-10-13 MED ORDER — SENNOSIDES-DOCUSATE SODIUM 8.6-50 MG PO TABS: 1.0000 | ORAL_TABLET | Freq: Every evening | ORAL | Status: DC | PRN

## 2011-10-13 MED ORDER — PANTOPRAZOLE SODIUM 40 MG PO TBEC
40.0000 mg | DELAYED_RELEASE_TABLET | Freq: Every day | ORAL | Status: DC
  Administered 2011-10-15: 40 mg via ORAL
  Filled 2011-10-13: qty 1

## 2011-10-13 MED ORDER — GLYCOPYRROLATE 0.2 MG/ML IJ SOLN
INTRAMUSCULAR | Status: DC | PRN
  Administered 2011-10-13: .4 mg via INTRAVENOUS

## 2011-10-13 MED ORDER — MUPIROCIN 2 % EX OINT
TOPICAL_OINTMENT | CUTANEOUS | Status: AC
  Filled 2011-10-13: qty 22

## 2011-10-13 MED ORDER — MORPHINE SULFATE 2 MG/ML IJ SOLN: 1.0000 mg | INTRAMUSCULAR | Status: DC | PRN

## 2011-10-13 MED ORDER — SIMVASTATIN 40 MG PO TABS: 40.0000 mg | ORAL_TABLET | Freq: Every day | ORAL | Status: DC

## 2011-10-13 MED ORDER — CYANOCOBALAMIN 500 MCG PO TABS
500.0000 ug | ORAL_TABLET | Freq: Every day | ORAL | Status: DC
  Administered 2011-10-13 – 2011-10-15 (×3): 500 ug via ORAL
  Filled 2011-10-13 (×3): qty 1

## 2011-10-13 MED ORDER — LACTATED RINGERS IV SOLN
INTRAVENOUS | Status: DC
  Administered 2011-10-13: 15:00:00 via INTRAVENOUS

## 2011-10-13 MED ORDER — CEPHALEXIN 500 MG PO CAPS
500.0000 mg | ORAL_CAPSULE | Freq: Three times a day (TID) | ORAL | Status: DC
  Administered 2011-10-13: 500 mg via ORAL
  Filled 2011-10-13 (×4): qty 1

## 2011-10-13 MED ORDER — HYDROMORPHONE HCL PF 1 MG/ML IJ SOLN
INTRAMUSCULAR | Status: AC
  Administered 2011-10-13: 0.5 mg via INTRAVENOUS
  Filled 2011-10-13: qty 1

## 2011-10-13 MED ORDER — METOPROLOL SUCCINATE ER 25 MG PO TB24
25.0000 mg | ORAL_TABLET | Freq: Every day | ORAL | Status: DC
  Administered 2011-10-14 – 2011-10-15 (×2): 25 mg via ORAL
  Filled 2011-10-13 (×2): qty 1

## 2011-10-13 MED ORDER — HYDROMORPHONE HCL PF 1 MG/ML IJ SOLN: 0.2500 mg | INTRAMUSCULAR | Status: DC | PRN

## 2011-10-13 MED ORDER — ONDANSETRON HCL 4 MG/2ML IJ SOLN: 4.0000 mg | Freq: Once | INTRAMUSCULAR | Status: DC | PRN

## 2011-10-13 MED ORDER — ONDANSETRON HCL 4 MG PO TABS: 4.0000 mg | ORAL_TABLET | Freq: Four times a day (QID) | ORAL | Status: DC | PRN

## 2011-10-13 MED ORDER — PROPOFOL 10 MG/ML IV EMUL
INTRAVENOUS | Status: DC | PRN
  Administered 2011-10-13: 100 mg via INTRAVENOUS

## 2011-10-13 MED ORDER — METHOCARBAMOL 500 MG PO TABS
500.0000 mg | ORAL_TABLET | Freq: Four times a day (QID) | ORAL | Status: DC | PRN
  Administered 2011-10-14 (×2): 500 mg via ORAL
  Filled 2011-10-13 (×3): qty 1

## 2011-10-13 MED ORDER — HYDROCODONE-ACETAMINOPHEN 5-325 MG PO TABS
1.0000 | ORAL_TABLET | ORAL | Status: DC | PRN
  Administered 2011-10-14: 1 via ORAL
  Filled 2011-10-13: qty 1

## 2011-10-13 SURGICAL SUPPLY — 61 items
BANDAGE ELASTIC 3 VELCRO ST LF (GAUZE/BANDAGES/DRESSINGS) ×2 IMPLANT
BANDAGE ELASTIC 4 VELCRO ST LF (GAUZE/BANDAGES/DRESSINGS) ×2 IMPLANT
BANDAGE GAUZE ELAST BULKY 4 IN (GAUZE/BANDAGES/DRESSINGS) IMPLANT
BIT DRILL 2 FAST STEP (BIT) ×2 IMPLANT
BIT DRILL 2.5X4 QC (BIT) ×2 IMPLANT
BNDG ESMARK 4X9 LF (GAUZE/BANDAGES/DRESSINGS) ×2 IMPLANT
CANISTER SUCTION 2500CC (MISCELLANEOUS) ×2 IMPLANT
CLOTH BEACON ORANGE TIMEOUT ST (SAFETY) ×2 IMPLANT
CORDS BIPOLAR (ELECTRODE) ×2 IMPLANT
COVER SURGICAL LIGHT HANDLE (MISCELLANEOUS) ×2 IMPLANT
CUFF TOURNIQUET SINGLE 18IN (TOURNIQUET CUFF) ×2 IMPLANT
CUFF TOURNIQUET SINGLE 24IN (TOURNIQUET CUFF) IMPLANT
DRAPE OEC MINIVIEW 54X84 (DRAPES) ×2 IMPLANT
DRAPE SURG 17X23 STRL (DRAPES) ×2 IMPLANT
DURAPREP 26ML APPLICATOR (WOUND CARE) ×2 IMPLANT
ELECT REM PT RETURN 9FT ADLT (ELECTROSURGICAL)
ELECTRODE REM PT RTRN 9FT ADLT (ELECTROSURGICAL) IMPLANT
GAUZE XEROFORM 1X8 LF (GAUZE/BANDAGES/DRESSINGS) ×2 IMPLANT
GLOVE BIO SURGEON STRL SZ 6.5 (GLOVE) ×4 IMPLANT
GLOVE BIOGEL PI IND STRL 6.5 (GLOVE) ×2 IMPLANT
GLOVE BIOGEL PI IND STRL 8 (GLOVE) ×2 IMPLANT
GLOVE BIOGEL PI INDICATOR 6.5 (GLOVE) ×2
GLOVE BIOGEL PI INDICATOR 8 (GLOVE) ×2
GLOVE ORTHO TXT STRL SZ7.5 (GLOVE) ×2 IMPLANT
GOWN SRG XL XLNG 56XLVL 4 (GOWN DISPOSABLE) ×1 IMPLANT
GOWN STRL NON-REIN LRG LVL3 (GOWN DISPOSABLE) ×6 IMPLANT
GOWN STRL NON-REIN XL XLG LVL4 (GOWN DISPOSABLE) ×1
KIT BASIN OR (CUSTOM PROCEDURE TRAY) ×2 IMPLANT
KIT ROOM TURNOVER OR (KITS) ×2 IMPLANT
KWIRE 4.0 X .062IN (WIRE) ×4 IMPLANT
MANIFOLD NEPTUNE II (INSTRUMENTS) IMPLANT
NEEDLE HYPO 25GX1X1/2 BEV (NEEDLE) IMPLANT
NEEDLE HYPO 25X1 1.5 SAFETY (NEEDLE) ×2 IMPLANT
NS IRRIG 1000ML POUR BTL (IV SOLUTION) ×2 IMPLANT
PACK ORTHO EXTREMITY (CUSTOM PROCEDURE TRAY) ×2 IMPLANT
PAD ARMBOARD 7.5X6 YLW CONV (MISCELLANEOUS) ×4 IMPLANT
PAD CAST 3X4 CTTN HI CHSV (CAST SUPPLIES) ×1 IMPLANT
PAD CAST 4YDX4 CTTN HI CHSV (CAST SUPPLIES) ×1 IMPLANT
PADDING CAST COTTON 3X4 STRL (CAST SUPPLIES) ×1
PADDING CAST COTTON 4X4 STRL (CAST SUPPLIES) ×1
PEG SUBCHONDRAL SMOOTH 2.0X18 (Peg) ×8 IMPLANT
PEG SUBCHONDRAL SMOOTH 2.0X20 (Peg) ×2 IMPLANT
PENCIL BUTTON HOLSTER BLD 10FT (ELECTRODE) IMPLANT
PLATE SHORT 21.6X48.9 NRRW RT (Plate) ×2 IMPLANT
SCREW CORT 3.5X10 LNG (Screw) ×4 IMPLANT
SCREW MULTI DIRECT 16MM (Screw) ×2 IMPLANT
SPONGE GAUZE 4X4 12PLY (GAUZE/BANDAGES/DRESSINGS) ×2 IMPLANT
SPONGE LAP 4X18 X RAY DECT (DISPOSABLE) ×2 IMPLANT
SPONGE SCRUB IODOPHOR (GAUZE/BANDAGES/DRESSINGS) IMPLANT
SUCTION FRAZIER TIP 10 FR DISP (SUCTIONS) ×2 IMPLANT
SUT ETHILON 3 0 PS 1 (SUTURE) ×4 IMPLANT
SUT PROLENE 3 0 PS 2 (SUTURE) IMPLANT
SUT VIC AB 2-0 FS1 27 (SUTURE) ×2 IMPLANT
SUT VIC AB 3-0 FS2 27 (SUTURE) IMPLANT
SUT VICRYL 4-0 PS2 18IN ABS (SUTURE) IMPLANT
SYR CONTROL 10ML LL (SYRINGE) ×2 IMPLANT
TOWEL OR 17X24 6PK STRL BLUE (TOWEL DISPOSABLE) ×2 IMPLANT
TOWEL OR 17X26 10 PK STRL BLUE (TOWEL DISPOSABLE) ×2 IMPLANT
TUBE CONNECTING 12X1/4 (SUCTIONS) ×2 IMPLANT
UNDERPAD 30X30 INCONTINENT (UNDERPADS AND DIAPERS) ×2 IMPLANT
WATER STERILE IRR 1000ML POUR (IV SOLUTION) IMPLANT

## 2011-10-13 NOTE — Progress Notes (Signed)
RIGHT EYEBROW WITH STITCHES, BRUISES  BIL ARMS AND LEGS DUE TO FALL.

## 2011-10-13 NOTE — Progress Notes (Signed)
Orthopedic Tech Progress Note Patient Details:  [name] [name] 1940/04/17 [id number]  Ortho Devices Type of Ortho Device: Other (comment) (kuzma sling) Ortho Device/Splint Location: (R) UE Ortho Device/Splint Interventions: Application   Jennye Moccasin 10/13/2011, 6:13 PM

## 2011-10-13 NOTE — Interval H&P Note (Signed)
History and Physical Interval Note:  10/13/2011 2:56 PM  [name] [name]  has presented today for surgery, with the diagnosis of Right Distal Radius Fracture  The various methods of treatment have been discussed with the patient and family. After consideration of risks, benefits and other options for treatment, the patient has consented to  Procedure(s) (LRB): OPEN REDUCTION INTERNAL FIXATION (ORIF) WRIST FRACTURE (Right) as a surgical intervention .  The patient's history has been reviewed, patient examined, no change in status, stable for surgery.  I have reviewed the patient's chart and labs.  Questions were answered to the patient's satisfaction.     [name] C

## 2011-10-13 NOTE — Preoperative (Signed)
Beta Blockers   Reason not to administer Beta Blockers:Not Applicable 

## 2011-10-13 NOTE — Progress Notes (Addendum)
Notified  DR Katrinka Blazing  OF PATIENT O2 SAT  88-89 ON RA, HX COPD EMPHYSEMA.  PER PTS SON SHE SENT O2 BACK BECAUSE IT GAVE HER HEADACHES.  DR Katrinka Blazing STATED WAS FINE TO HAVE PATIENT ON OXYGEN 1-2 LITER AND A PAIN PILL . (PATIENT WILL TAKE HOME MED)   Notified DR Katrinka Blazing OF PATIENT EATING 2 SLICES TOAST WITH JELLY AND 1/2 CUP COFFEE  WITH CREAM 630 AM .

## 2011-10-13 NOTE — Brief Op Note (Cosign Needed)
10/13/2011  5:05 PM  PATIENT:  [name] [name]  71 y.o. female  PRE-OPERATIVE DIAGNOSIS:  Right Distal Radius Fracture  POST-OPERATIVE DIAGNOSIS:  Right Distal Radius Fracture  PROCEDURE:  Procedure(s) (LRB): OPEN REDUCTION INTERNAL FIXATION (ORIF) WRIST FRACTURE (Right)  SURGEON:  Surgeon(s) and Role:    * Eldred Manges, MD - Primary  PHYSICIAN ASSISTANT: Maud Deed PAC  ASSISTANTS: none   ANESTHESIA:   general  EBL:  Total I/O In: 1000 [I.V.:1000] Out: 40 [Blood:40]  BLOOD ADMINISTERED:none  DRAINS: none   LOCAL MEDICATIONS USED:  MARCAINE     SPECIMEN:  No Specimen  DISPOSITION OF SPECIMEN:  N/A  COUNTS:  YES  TOURNIQUET:   Total Tourniquet Time Documented: Upper Arm (Right) - 26 minutes  DICTATION: .Note written in EPIC  PLAN OF CARE: Admit for overnight observation  PATIENT DISPOSITION:  PACU - hemodynamically stable.   Delay start of Pharmacological VTE agent (>24hrs) due to surgical blood loss or risk of bleeding: yes

## 2011-10-13 NOTE — Anesthesia Preprocedure Evaluation (Addendum)
Anesthesia Evaluation  Patient identified by MRN, date of birth, ID band Patient awake    Reviewed: Allergy & Precautions, H&P , NPO status , Patient's Chart, lab work & pertinent test results  History of Anesthesia Complications Negative for: history of anesthetic complications  Airway Mallampati: I TM Distance: >3 FB Neck ROM: full    Dental  (+) Edentulous Upper, Edentulous Lower and Dental Advisory Given   Pulmonary shortness of breath, asthma , pneumonia -, resolved, COPD COPD inhaler,          Cardiovascular hypertension, Pt. on medications and Pt. on home beta blockers + Past MI Rhythm:regular Rate:Normal     Neuro/Psych  Headaches,    GI/Hepatic Neg liver ROS, GERD-  Medicated and Controlled,  Endo/Other  negative endocrine ROS  Renal/GU negative Renal ROS     Musculoskeletal   Abdominal   Peds  Hematology   Anesthesia Other Findings   Reproductive/Obstetrics negative OB ROS                        Anesthesia Physical Anesthesia Plan  ASA: III  Anesthesia Plan: General   Post-op Pain Management:    Induction: Intravenous  Airway Management Planned: Oral ETT and LMA  Additional Equipment:   Intra-op Plan:   Post-operative Plan: Possible Post-op intubation/ventilation  Informed Consent: I have reviewed the patients History and Physical, chart, labs and discussed the procedure including the risks, benefits and alternatives for the proposed anesthesia with the patient or authorized representative who has indicated his/her understanding and acceptance.     Plan Discussed with: CRNA, Anesthesiologist and Surgeon  Anesthesia Plan Comments:        Anesthesia Quick Evaluation

## 2011-10-13 NOTE — H&P (Signed)
[name] [name] is an 71 y.o. female.   Chief Complaint: right wrist fracture. HPI: Pt fell at her home yesterday and injured her right wrist. Seen in ED and xrays with displaced distal radius fracture. Pt splinted and seen by Dr Ophelia Charter for evaluation.  Pt will need ORIF of displaced distal radius fracture.  Risk and benefits of procedure discussed with pt and family and all questions answered.  Past Medical History  Diagnosis Date  . Hypertension   . Myocardial infarction   . Shortness of breath   . Asthma   . COPD (chronic obstructive pulmonary disease)     emphysema    . Pneumonia     hx pneumonia ,chronic bronchitis   . H/O blood clots   . Chronic kidney disease     current   UTI   being treated  . GERD (gastroesophageal reflux disease)   . Headache     hx migraines  . Blood dyscrasia     hx blood clotts  . Arthritis     Past Surgical History  Procedure Date  . Right leg   . Hip fracture surgery     RIGHT SIDE  . Shoulder surgery     RIGHT  . Cholecystectomy   . Eye surgery     BIL CATARACT     History reviewed. No pertinent family history. Social History:  reports that she has been smoking.  She does not have any smokeless tobacco history on file. She reports that she does not drink alcohol or use illicit drugs.  Allergies: No Known Allergies  Medications Prior to Admission  Medication Sig Dispense Refill  . amLODipine (NORVASC) 10 MG tablet Take 10 mg by mouth daily.      Marland Kitchen aspirin EC 81 MG tablet Take 81 mg by mouth daily.      . cephALEXin (KEFLEX) 500 MG capsule Take 500 mg by mouth 3 (three) times daily. Starting 10/12/11 for 7 days      . dextromethorphan 15 MG/5ML syrup Take 15 mLs by mouth 4 (four) times daily as needed. For cough      . docusate sodium (COLACE) 100 MG capsule Take 100 mg by mouth daily as needed. For constipation      . fesoterodine (TOVIAZ) 4 MG TB24 Take 4 mg by mouth at bedtime.      . Homeopathic Products ([employer] LEG CRAMPS PAIN RELIEF)  TABS Take 1 tablet by mouth daily as needed. For leg cramps      . ibuprofen (ADVIL,MOTRIN) 200 MG tablet Take 600 mg by mouth every 6 (six) hours as needed. For pain      . loperamide (IMODIUM) 2 MG capsule Take 2 mg by mouth 4 (four) times daily as needed. For diarrhea.      . meclizine (ANTIVERT) 25 MG tablet Take 25 mg by mouth every 4 (four) hours as needed. For dizziness      . metoprolol succinate (TOPROL-XL) 50 MG 24 hr tablet Take 25 mg by mouth daily. Take with or immediately following a meal.      . omeprazole (PRILOSEC) 20 MG capsule Take 20 mg by mouth daily.      Marland Kitchen oxyCODONE-acetaminophen (PERCOCET/ROXICET) 5-325 MG per tablet Take 1 tablet by mouth every 4 (four) hours as needed for pain.  12 tablet  0  . pravastatin (PRAVACHOL) 80 MG tablet Take 80 mg by mouth at bedtime.      . traMADol (ULTRAM) 50 MG tablet Take 50-100 mg  by mouth every 6 (six) hours as needed. For back pain      . vitamin B-12 (CYANOCOBALAMIN) 500 MCG tablet Take 500 mcg by mouth daily.      Marland Kitchen DISCONTD: cephALEXin (KEFLEX) 500 MG capsule Take 1 capsule (500 mg total) by mouth 3 (three) times daily.  20 capsule  0    Results for orders placed during the hospital encounter of 10/13/11 (from the past 48 hour(s))  BASIC METABOLIC PANEL     Status: Abnormal   Collection Time   10/13/11 11:08 AM      Component Value Range Comment   Sodium 136  135 - 145 mEq/L    Potassium 3.6  3.5 - 5.1 mEq/L    Chloride 99  96 - 112 mEq/L    CO2 30  19 - 32 mEq/L    Glucose, Bld 110 (*) 70 - 99 mg/dL    BUN 18  6 - 23 mg/dL    Creatinine, Ser 4.78  0.50 - 1.10 mg/dL    Calcium 8.8  8.4 - 29.5 mg/dL    GFR calc non Af Amer 90 (*) >90 mL/min    GFR calc Af Amer >90  >90 mL/min   CBC     Status: Abnormal   Collection Time   10/13/11 11:08 AM      Component Value Range Comment   WBC 7.7  4.0 - 10.5 K/uL    RBC 4.94  3.87 - 5.11 MIL/uL    Hemoglobin 13.4  12.0 - 15.0 g/dL DELTA CHECK NOTED   HCT 42.2  36.0 - 46.0 %    MCV  85.4  78.0 - 100.0 fL    MCH 27.1  26.0 - 34.0 pg    MCHC 31.8  30.0 - 36.0 g/dL    RDW 62.1 (*) 30.8 - 15.5 %    Platelets 138 (*) 150 - 400 K/uL   PROTIME-INR     Status: Normal   Collection Time   10/13/11 11:08 AM      Component Value Range Comment   Prothrombin Time 12.8  11.6 - 15.2 seconds    INR 0.94  0.00 - 1.49    Dg Chest 2 View  10/13/2011  *RADIOLOGY REPORT*  Clinical Data: Preop right wrist fracture.Hypertension, shortness of breath, COPD, smoker.  CHEST - 2 VIEW  Comparison: 10/12/2011  Findings: There is hyperinflation of the lungs compatible with COPD.  There is a scarring in the upper lobes bilaterally, unchanged.  Heart is normal size.  No effusions.  No acute bony abnormality.  IMPRESSION: COPD/chronic changes.  No active disease or change.  Original Report Authenticated By: Cyndie Chime, M.D.   Dg Ribs Unilateral W/chest Right  10/12/2011  *RADIOLOGY REPORT*  Clinical Data: Fall and right lower rib pain.  RIGHT RIBS AND CHEST - 3+ VIEW  Comparison: Chest radiograph 05/06/2010  Findings: Chest radiograph demonstrates chronic changes in the upper lungs, right side greater than left.  However, there are increased vague densities in the upper lungs and cannot exclude areas of nodularity.   Postsurgical changes in the right upper lung.  Heart and mediastinum are stable.  Trachea is midline.  No evidence for pneumothorax.  There may be an old right fourth rib fracture.  There is concern for a nondisplaced fracture involving the right seventh rib. Evidence for an old proximal right humeral fracture.  IMPRESSION: Question a nondisplaced fracture involving the right seventh rib.  Old right fourth rib fracture and old  right proximal humeral fracture.  Increased vague densities in the upper lungs, right side greater than left.  Some of these findings are related to chronic changes and postsurgical changes.  However, new areas of nodularity cannot be excluded.  Recommend close follow-up  with PA and lateral chest views or a chest CT for further evaluation.  Original Report Authenticated By: Richarda Overlie, M.D.   Dg Wrist Complete Right  10/12/2011  *RADIOLOGY REPORT*  Clinical Data: Fall and wrist pain.  RIGHT WRIST - COMPLETE 3+ VIEW  Comparison: None.  Findings: Four views of the wrist demonstrate a displaced fracture involving the distal radius.  There is volar angulation of the distal fragment.  It is unclear if the fracture involves the wrist articulating surface.  The distal ulna appears to be intact and there is diffuse osteopenia. Carpal bones are intact.  IMPRESSION: Displaced fracture of the distal right radius.  Original Report Authenticated By: Richarda Overlie, M.D.   Dg Ankle Complete Right  10/12/2011  *RADIOLOGY REPORT*  Clinical Data: Fall and scratches to the ankle.  RIGHT ANKLE - COMPLETE 3+ VIEW  Comparison: None.  Findings: Three views of the right ankle were obtained.  There is diffuse osteopenia.  Negative for acute fracture or dislocation. There are calcaneal spurs.  Difficult to exclude mild soft tissue swelling.  IMPRESSION: Diffuse osteopenia without acute bony abnormality.  Original Report Authenticated By: Richarda Overlie, M.D.   Ct Head Wo Contrast  10/12/2011  *RADIOLOGY REPORT*  Clinical Data: Fall.  Right supraorbital abrasions and contusions. No loss of consciousness.  CT HEAD WITHOUT CONTRAST  Technique:  Contiguous axial images were obtained from the base of the skull through the vertex without contrast.  Comparison: CT head without contrast 04/05/2007 at [facility].  Findings: Mild generalized atrophy is present.  No acute infarct, hemorrhage, or mass lesion is present.  The ventricles are normal size.  Mild periventricular white matter hypoattenuation is similar to the prior study.  There is scattered subcortical disease as well.  The visualized paranasal sinuses and mastoid air cells are clear. The osseous skull is intact.  No significant extra cranial soft  tissue injury is evident.  No skull fracture is identified.  IMPRESSION:  1.  No acute intracranial abnormality or significant interval change. 2.  Stable atrophy and white matter disease.  Original Report Authenticated By: Jamesetta Orleans. MATTERN, M.D.   Dg Hand Complete Right  10/12/2011  *RADIOLOGY REPORT*  Clinical Data: Fall with pain.  RIGHT HAND - COMPLETE 3+ VIEW  Comparison: Right wrist 10/12/2011.  Findings: Osteopenia.  Impacted fracture of the distal radius is incidentally imaged.  No evidence of an additional acute fracture in the hand.  IMPRESSION:  1.  Impacted distal radius fracture. 2.  Osteopenia.  Original Report Authenticated By: Reyes Ivan, M.D.    Review of Systems  Constitutional: Negative.   HENT: Negative.   Eyes: Negative.   Respiratory: Positive for cough.   Cardiovascular: Negative.   Gastrointestinal: Negative.   Genitourinary: Negative.   Musculoskeletal: Positive for falls.  Skin: Negative.   Neurological: Positive for dizziness and loss of consciousness.       Pt unsure if she lost consciousness after she fell  Endo/Heme/Allergies: Bruises/bleeds easily.  Psychiatric/Behavioral: Negative.     Blood pressure 105/68, pulse 68, temperature 98.6 F (37 C), resp. rate 18, SpO2 98.00%. Physical Exam  Constitutional: She is oriented to person, place, and time. She appears well-developed and well-nourished.  HENT:  Head: Normocephalic.  Ecchymosis over right eye with sutures in laceration  Eyes: EOM are normal. Pupils are equal, round, and reactive to light.  Neck: Normal range of motion. Neck supple.  Cardiovascular: Normal rate.   Respiratory: Effort normal.  GI: Soft.  Musculoskeletal:       Right arm in splint.  Cap refill and sensation of fingers intact.  Neurological: She is alert and oriented to person, place, and time.  Skin: Skin is warm and dry.  Psychiatric: She has a normal mood and affect.     Assessment/Plan Right displaced  distal radius fracture  PLAN: ORIF of right distal radius fracture by Dr Joycelyn Das 10/13/2011, 12:20 PM

## 2011-10-13 NOTE — Transfer of Care (Signed)
Immediate Anesthesia Transfer of Care Note  Patient: [name] [name]  Procedure(s) Performed: Procedure(s) (LRB): OPEN REDUCTION INTERNAL FIXATION (ORIF) WRIST FRACTURE (Right)  Patient Location: PACU  Anesthesia Type: General  Level of Consciousness: awake, alert  and confused  Airway & Oxygen Therapy: Patient Spontanous Breathing and Patient connected to nasal cannula oxygen  Post-op Assessment: Report given to PACU RN and Post -op Vital signs reviewed and stable  Post vital signs: Reviewed and stable  Complications: No apparent anesthesia complications

## 2011-10-14 ENCOUNTER — Encounter (HOSPITAL_COMMUNITY): Payer: Self-pay | Admitting: Orthopaedic Surgery

## 2011-10-14 MED ORDER — ACETAMINOPHEN 325 MG PO TABS
650.0000 mg | ORAL_TABLET | Freq: Four times a day (QID) | ORAL | Status: DC | PRN
  Administered 2011-10-14 – 2011-10-15 (×2): 650 mg via ORAL
  Filled 2011-10-14: qty 2

## 2011-10-14 MED ORDER — HYDROCODONE-ACETAMINOPHEN 5-325 MG PO TABS: 1.0000 | ORAL_TABLET | ORAL | Status: AC | PRN

## 2011-10-14 MED ORDER — CEPHALEXIN 500 MG PO CAPS
500.0000 mg | ORAL_CAPSULE | Freq: Three times a day (TID) | ORAL | Status: DC
  Administered 2011-10-14 – 2011-10-15 (×2): 500 mg via ORAL
  Filled 2011-10-14 (×4): qty 1

## 2011-10-14 MED ORDER — ALBUTEROL SULFATE HFA 108 (90 BASE) MCG/ACT IN AERS
1.0000 | INHALATION_SPRAY | RESPIRATORY_TRACT | Status: DC
  Administered 2011-10-14 – 2011-10-15 (×3): 2 via RESPIRATORY_TRACT
  Filled 2011-10-14: qty 6.7

## 2011-10-14 MED ORDER — GUAIFENESIN ER 600 MG PO TB12
600.0000 mg | ORAL_TABLET | Freq: Two times a day (BID) | ORAL | Status: DC | PRN
  Filled 2011-10-14: qty 1

## 2011-10-14 MED FILL — Mupirocin Oint 2%: CUTANEOUS | Qty: 22 | Status: AC

## 2011-10-14 NOTE — Progress Notes (Signed)
CARE MANAGEMENT NOTE 10/14/2011  Patient:  [name] [name], [name] [name]   Account Number:  [id number]  Date Initiated:  10/14/2011  Documentation initiated by:  Vance Peper  Subjective/Objective Assessment:   71 yr old female s/p ORIF of right distal radius     Action/Plan:   CM spoke with patient's son regarding needs at discharge.States his mom will go home with him and she has rolling walker.   Anticipated DC Date:  10/15/2011   Anticipated DC Plan:  HOME W HOME HEALTH SERVICES      DC Planning Services  CM consult      [facility] Choice  HOME HEALTH   Choice offered to / List presented to:  C-4 Adult Children        HH arranged  HH-2 PT  HH-4 NURSE'S AIDE      Status of service:  Completed, signed off Medicare Important Message given?   (If response is "NO", the following Medicare IM given date fields will be blank) Date Medicare IM given:   Date Additional Medicare IM given:    Discharge Disposition:  HOME W HOME HEALTH SERVICES  Per UR Regulation:    If discussed at Long Length of Stay Meetings, dates discussed:    Comments:  10/14/11 14:33 Vance Peper, RN  BSN Case Manager CM spoke with patient's son- [name] [name]. Patient will be staying with he and his wife- [address] [place], [place]  [phone] (wife's cell)  ([name]'s cell [phone]).

## 2011-10-14 NOTE — Op Note (Signed)
[name] [name], [name] [name] NO.:  [id number]  MEDICAL RECORD NO.:  [id number]  LOCATION:  [id number]                        FACILITY:  [facility]  PHYSICIAN:  Mark C. Ophelia Charter, M.D.    DATE OF BIRTH:  03/09/1941  DATE OF PROCEDURE:  10/13/2011 DATE OF DISCHARGE:                              OPERATIVE REPORT   PREOPERATIVE DIAGNOSIS:  Right displaced angulated distal radius fracture.  POSTOPERATIVE DIAGNOSIS:  Right displaced angulated distal radius fracture.  PROCEDURE:  Open reduction internal fixation, right distal radius.  SURGEON:  Mark C. Ophelia Charter, M.D.  ASSISTANT:  Wende Neighbors, PA-C, medically necessary and present for the entire procedure.  EBL:  Minimal.  TOURNIQUET TIME:  24 minutes.  PROCEDURE:  After induction of preoperative attempted regional block which was partially successful, patient underwent induction of general anesthesia.  Preoperative Ancef prophylaxis, standard prepping and draping with DuraPrep was used up to the proximal arm tourniquet.  Usual extremity sheets, drapes were applied.  Time-out procedure was completed.  Sterile skin marker was used over the flexor carpi radialis, and after wrapping the arm with an Esmarch, tourniquet was inflated. Incision was made.  Sheet over the FCR was split and then flexor carpi radialis port was pulled toward the radial arteries and protected with one end of the Senn retractor and the posterior flexor carpi radialis sheath was opened.  Pronator was pulled from radial side toward the ulnar side.  Fracture was evacuated of the hematoma.  There is small central cortical fragment that had flipped down and was not in the soft tissue and then flipped back inside the metaphyseal region of the bone leaving a defect which was approximately 3 x 6 mm.  The rest of the cortex lined up appropriately and there was still remaining radial ulnar aspects of the radial cortex that could be lined up.  K-wire was placed across  the fracture site but due to the patient's soft bone, there was still slight mobility.  Then, DVR plate was selected, placed with K-wire checked under fluoroscopy.  Two 10-mm screws were placed after adjustment.  With the fracture reduced, the distal holes were filled starting from the ulnar aspect extending toward the radius.  A variable angle screw was placed 16-mm partially-threaded into the styloid.  Final spot pictures were taken showing good position.  The small cortical defect was still present.  Joint reduction was anatomic.  Due to the soft cortex, the area of the fracture line showed about 1 mm off of the plate.  Distally, the plate sets snug against the bone.  After irrigation with saline solution, standard closure with 2-0 in the fascia and then 4-0 nylon skin closure.  Postop splint was applied.  The patient tolerated the procedure well, was transferred to recovery room.  Tourniquet was deflated prior to closure, and instrument count and needle count was correct.     Mark C. Ophelia Charter, M.D.     MCY/MEDQ  D:  10/13/2011  T:  10/14/2011  Job:  [id number]

## 2011-10-14 NOTE — Progress Notes (Signed)
UR COMPLETED  

## 2011-10-14 NOTE — Discharge Summary (Signed)
Physician Discharge Summary  Patient ID: [name] [name] MRN: [id number] DOB/AGE: Nov 26, 1940 71 y.o.  Admit date: 10/13/2011 Discharge date: 10/14/2011  Admission Diagnoses:  Closed fracture of right distal radius Displaced and angulated fracture  Discharge Diagnoses:  Principal Problem:  *Closed fracture of right distal radius   Past Medical History  Diagnosis Date  . Hypertension   . Myocardial infarction   . Shortness of breath   . Asthma   . COPD (chronic obstructive pulmonary disease)     emphysema    . Pneumonia     hx pneumonia ,chronic bronchitis   . H/O blood clots   . Chronic kidney disease     current   UTI   being treated  . GERD (gastroesophageal reflux disease)   . Headache     hx migraines  . Blood dyscrasia     hx blood clotts  . Arthritis     Surgeries: Procedure(s): OPEN REDUCTION INTERNAL FIXATION (ORIF) WRIST FRACTURE on 10/13/2011.  Right distal radius with displacement and angulation   Consultants (if any):  none  Discharged Condition: Improved  Hospital Course: [name] [name] is an 71 y.o. female who was admitted 10/13/2011 with a diagnosis of Closed fracture of right distal radius and went to the operating room on 10/13/2011 and underwent the above named procedures.    She was given perioperative antibiotics:  Anti-infectives     Start     Dose/Rate Route Frequency Ordered Stop   10/13/11 2200   cephALEXin (KEFLEX) capsule 500 mg        500 mg Oral 3 times daily 10/13/11 1841     10/13/11 2200   ceFAZolin (ANCEF) IVPB 1 g/50 mL premix        1 g 100 mL/hr over 30 Minutes Intravenous Every 6 hours 10/13/11 1841 10/14/11 1559   10/13/11 1053   ceFAZolin (ANCEF) IVPB 2 g/50 mL premix        2 g 100 mL/hr over 30 Minutes Intravenous 60 min pre-op 10/13/11 1053 10/13/11 1552        .  She was given sequential compression devices, early ambulation.  She benefited maximally from the hospital stay and there were no complications.     Recent vital signs:  Filed Vitals:   10/14/11 0624  BP: 132/65  Pulse: 89  Temp: 97.4 F (36.3 C)  Resp: 16    Recent laboratory studies:  Lab Results  Component Value Date   HGB 13.4 10/13/2011   HGB 16.3* 10/12/2011   HGB 13.9 10/12/2011   Lab Results  Component Value Date   WBC 7.7 10/13/2011   PLT 138* 10/13/2011   Lab Results  Component Value Date   INR 0.94 10/13/2011   Lab Results  Component Value Date   NA 136 10/13/2011   K 3.6 10/13/2011   CL 99 10/13/2011   CO2 30 10/13/2011   BUN 18 10/13/2011   CREATININE 0.61 10/13/2011   GLUCOSE 110* 10/13/2011    Discharge Medications:   Medication List  As of 10/14/2011  9:06 AM   TAKE these medications         amLODipine 10 MG tablet   Commonly known as: NORVASC   Take 10 mg by mouth daily.      aspirin EC 81 MG tablet   Take 81 mg by mouth daily.      cephALEXin 500 MG capsule   Commonly known as: KEFLEX   Take 500 mg by mouth 3 (three)  times daily. Starting 10/12/11 for 7 days      [employer] LEG CRAMPS PAIN RELIEF Tabs   Take 1 tablet by mouth daily as needed. For leg cramps      dextromethorphan 15 MG/5ML syrup   Take 15 mLs by mouth 4 (four) times daily as needed. For cough      docusate sodium 100 MG capsule   Commonly known as: COLACE   Take 100 mg by mouth daily as needed. For constipation      HYDROcodone-acetaminophen 5-325 MG per tablet   Commonly known as: NORCO/VICODIN   Take 1-2 tablets by mouth every 4 (four) hours as needed.      ibuprofen 200 MG tablet   Commonly known as: ADVIL,MOTRIN   Take 600 mg by mouth every 6 (six) hours as needed. For pain      loperamide 2 MG capsule   Commonly known as: IMODIUM   Take 2 mg by mouth 4 (four) times daily as needed. For diarrhea.      meclizine 25 MG tablet   Commonly known as: ANTIVERT   Take 25 mg by mouth every 4 (four) hours as needed. For dizziness      metoprolol succinate 50 MG 24 hr tablet   Commonly known as: TOPROL-XL   Take 25 mg by  mouth daily. Take with or immediately following a meal.      omeprazole 20 MG capsule   Commonly known as: PRILOSEC   Take 20 mg by mouth daily.      oxyCODONE-acetaminophen 5-325 MG per tablet   Commonly known as: PERCOCET/ROXICET   Take 1 tablet by mouth every 4 (four) hours as needed for pain.      pravastatin 80 MG tablet   Commonly known as: PRAVACHOL   Take 80 mg by mouth at bedtime.      TOVIAZ 4 MG Tb24   Generic drug: fesoterodine   Take 4 mg by mouth at bedtime.      traMADol 50 MG tablet   Commonly known as: ULTRAM   Take 50-100 mg by mouth every 6 (six) hours as needed. For back pain      vitamin B-12 500 MCG tablet   Commonly known as: CYANOCOBALAMIN   Take 500 mcg by mouth daily.            Diagnostic Studies: Dg Chest 2 View  10/13/2011  *RADIOLOGY REPORT*  Clinical Data: Preop right wrist fracture.Hypertension, shortness of breath, COPD, smoker.  CHEST - 2 VIEW  Comparison: 10/12/2011  Findings: There is hyperinflation of the lungs compatible with COPD.  There is a scarring in the upper lobes bilaterally, unchanged.  Heart is normal size.  No effusions.  No acute bony abnormality.  IMPRESSION: COPD/chronic changes.  No active disease or change.  Original Report Authenticated By: Cyndie Chime, M.D.   Dg Ribs Unilateral W/chest Right  10/12/2011  *RADIOLOGY REPORT*  Clinical Data: Fall and right lower rib pain.  RIGHT RIBS AND CHEST - 3+ VIEW  Comparison: Chest radiograph 05/06/2010  Findings: Chest radiograph demonstrates chronic changes in the upper lungs, right side greater than left.  However, there are increased vague densities in the upper lungs and cannot exclude areas of nodularity.   Postsurgical changes in the right upper lung.  Heart and mediastinum are stable.  Trachea is midline.  No evidence for pneumothorax.  There may be an old right fourth rib fracture.  There is concern for a nondisplaced fracture involving the right  seventh rib. Evidence for an  old proximal right humeral fracture.  IMPRESSION: Question a nondisplaced fracture involving the right seventh rib.  Old right fourth rib fracture and old right proximal humeral fracture.  Increased vague densities in the upper lungs, right side greater than left.  Some of these findings are related to chronic changes and postsurgical changes.  However, new areas of nodularity cannot be excluded.  Recommend close follow-up with PA and lateral chest views or a chest CT for further evaluation.  Original Report Authenticated By: Richarda Overlie, M.D.   Dg Wrist Complete Right  10/12/2011  *RADIOLOGY REPORT*  Clinical Data: Fall and wrist pain.  RIGHT WRIST - COMPLETE 3+ VIEW  Comparison: None.  Findings: Four views of the wrist demonstrate a displaced fracture involving the distal radius.  There is volar angulation of the distal fragment.  It is unclear if the fracture involves the wrist articulating surface.  The distal ulna appears to be intact and there is diffuse osteopenia. Carpal bones are intact.  IMPRESSION: Displaced fracture of the distal right radius.  Original Report Authenticated By: Richarda Overlie, M.D.   Dg Ankle Complete Right  10/12/2011  *RADIOLOGY REPORT*  Clinical Data: Fall and scratches to the ankle.  RIGHT ANKLE - COMPLETE 3+ VIEW  Comparison: None.  Findings: Three views of the right ankle were obtained.  There is diffuse osteopenia.  Negative for acute fracture or dislocation. There are calcaneal spurs.  Difficult to exclude mild soft tissue swelling.  IMPRESSION: Diffuse osteopenia without acute bony abnormality.  Original Report Authenticated By: Richarda Overlie, M.D.   Ct Head Wo Contrast  10/12/2011  *RADIOLOGY REPORT*  Clinical Data: Fall.  Right supraorbital abrasions and contusions. No loss of consciousness.  CT HEAD WITHOUT CONTRAST  Technique:  Contiguous axial images were obtained from the base of the skull through the vertex without contrast.  Comparison: CT head without contrast 04/05/2007  at [facility].  Findings: Mild generalized atrophy is present.  No acute infarct, hemorrhage, or mass lesion is present.  The ventricles are normal size.  Mild periventricular white matter hypoattenuation is similar to the prior study.  There is scattered subcortical disease as well.  The visualized paranasal sinuses and mastoid air cells are clear. The osseous skull is intact.  No significant extra cranial soft tissue injury is evident.  No skull fracture is identified.  IMPRESSION:  1.  No acute intracranial abnormality or significant interval change. 2.  Stable atrophy and white matter disease.  Original Report Authenticated By: Jamesetta Orleans. MATTERN, M.D.   Dg Hand Complete Right  10/12/2011  *RADIOLOGY REPORT*  Clinical Data: Fall with pain.  RIGHT HAND - COMPLETE 3+ VIEW  Comparison: Right wrist 10/12/2011.  Findings: Osteopenia.  Impacted fracture of the distal radius is incidentally imaged.  No evidence of an additional acute fracture in the hand.  IMPRESSION:  1.  Impacted distal radius fracture. 2.  Osteopenia.  Original Report Authenticated By: Reyes Ivan, M.D.    Disposition: 01-Home or Self Care  Discharge Orders    Future Orders Please Complete By Expires   Diet - low sodium heart healthy      Call MD / Call 911      Comments:   If you experience chest pain or shortness of breath, CALL 911 and be transported to the hospital emergency room.  If you develope a fever above 101 F, pus (white drainage) or increased drainage or redness at the wound, or calf pain, call  your surgeon's office.   Constipation Prevention      Comments:   Drink plenty of fluids.  Prune juice may be helpful.  You may use a stool softener, such as Colace (over the counter) 100 mg twice a day.  Use MiraLax (over the counter) for constipation as needed.   Increase activity slowly as tolerated      Discharge instructions      Comments:   Keep splint dry and clean at all times.  Elevate hand above  heart when at rest.  Move fingers frequently.  Wear sling when out of bed. Ice packs to wrist as needed. No weight bearing on right arm   Lifting restrictions      Comments:   No lifting with right arm      Follow-up Information    Follow up with YATES,MARK C, MD. Schedule an appointment as soon as possible for a visit in 1 week.   Contact information:   [facility] [address] [place] [address] [phone]           Signed: Wende Neighbors 10/14/2011, 9:06 AM

## 2011-10-14 NOTE — Progress Notes (Signed)
Occupational Therapy Treatment Patient Details Name: [name] [name] MRN: [id number] DOB: 03-09-1941 Today's Date: 10/14/2011 Time: 2725-3664 OT Time Calculation (min): 54 min  OT Assessment / Plan / Recommendation Comments on Treatment Session This 71 yo female more awake/alert this PM, but still needs lots of A. Sats down into the low 60's on RA when up to the bathroom--pt remained awake and alert. Returned to bed and put pt back on O2 at 2 liters with Sats back up into low 90's with 3+ minutes--RN aware and in room.    Follow Up Recommendations  Home health OT    Barriers to Discharge       Equipment Recommendations   (TBD--OT)    Recommendations for Other Services    Frequency Min 3X/week   Plan Discharge plan remains appropriate    Precautions / Restrictions Precautions Precautions: Fall Restrictions Weight Bearing Restrictions: Yes RUE Weight Bearing: Non weight bearing   Pertinent Vitals/Pain 8/10 left ribs    ADL  Toilet Transfer: Moderate assistance;Performed Toilet Transfer Method: Sit to Barista: Comfort height toilet;Grab bars Toileting - Clothing Manipulation and Hygiene: Performed;+1 Total assistance (pt incontient on way to bathroom) Where Assessed - Glass blower/designer Manipulation and Hygiene: Standing Equipment Used: Gait belt Transfers/Ambulation Related to ADLs: Mod A    OT Diagnosis:    OT Problem List:   OT Treatment Interventions:     OT Goals ADL Goals ADL Goal: Statistician - Progress: Progressing toward goals ADL Goal: Toileting - Clothing Manipulation - Progress: Progressing toward goals ADL Goal: Toileting - Hygiene - Progress: Progressing toward goals Miscellaneous OT Goals OT Goal: Miscellaneous Goal #2 - Progress: Progressing toward goals  Visit Information  Last OT Received On: 10/14/11 Assistance Needed: +1 (with 1 for lines)    Subjective Data  Subjective: When I be back to feeling like myself   Prior  Functioning       Cognition  Overall Cognitive Status: Impaired Area of Impairment: Attention;Safety/judgement Arousal/Alertness: Awake/alert Behavior During Session: Anxious Current Attention Level: Sustained Following Commands: Follows one step commands with increased time Awareness of Deficits: Not falling to the right in sitting for second session    Mobility Bed Mobility Bed Mobility: Supine to Sit Supine to Sit: 3: Mod assist;HOB elevated (30 degrees) Transfers Transfers: Sit to Stand;Stand to Sit Sit to Stand: 3: Mod assist;Without upper extremity assist;From bed Stand to Sit: 3: Mod assist;Without upper extremity assist;To bed   Exercises Other Exercises Other Exercises: Educated pt's son on ROM exercises for pt's LUE (digit flex/extension; elbow flexion/extension; shoulder flex/extension)  Balance    End of Session OT - End of Session Equipment Utilized During Treatment: Gait belt Activity Tolerance: Patient limited by fatigue (limited by low O2 sats on RA) Patient left: in bed;with call bell/phone within reach;with family/visitor present (son) Nurse Communication: Mobility status (knees wobbly and pt "bobbing" up/down with ambulation)       Evette Georges [phone] 10/14/2011, 6:30 PM

## 2011-10-14 NOTE — Evaluation (Addendum)
Occupational Therapy Evaluation Patient Details Name: [name] [name] MRN: [id number] DOB: 1941/03/07 Today's Date: 10/14/2011 Time: 5784-6962 OT Time Calculation (min): 54 min  OT Assessment / Plan / Recommendation Clinical Impression       OT Assessment       Follow Up Recommendations  Home health OT    Barriers to Discharge      Equipment Recommendations   (TBD--OT)    Recommendations for Other Services    Frequency  Min 3X/week    Precautions / Restrictions Precautions Precautions: Fall Restrictions Weight Bearing Restrictions: Yes RUE Weight Bearing: Non weight bearing       ADL  Toilet Transfer: Moderate assistance;Performed Toilet Transfer Method: Sit to Barista: Comfort height toilet;Grab bars Toileting - Clothing Manipulation and Hygiene: Performed;+1 Total assistance (pt incontient on way to bathroom) Where Assessed - Glass blower/designer Manipulation and Hygiene: Standing Equipment Used: Gait belt Transfers/Ambulation Related to ADLs: Mod A    OT Diagnosis:    OT Problem List:   OT Treatment Interventions:     OT Goals ADL Goals ADL Goal: Statistician - Progress: Progressing toward goals ADL Goal: Toileting - Clothing Manipulation - Progress: Progressing toward goals ADL Goal: Toileting - Hygiene - Progress: Progressing toward goals Miscellaneous OT Goals OT Goal: Miscellaneous Goal #2 - Progress: Progressing toward goals  Visit Information  Last OT Received On: 10/14/11 Assistance Needed: +1 (with 1 for lines)    Subjective Data  Subjective: When I be back to feeling like myself   Prior Functioning  Vision/Perception  Dominant Hand: Left      Cognition  Overall Cognitive Status: Impaired Area of Impairment: Attention;Safety/judgement Arousal/Alertness: Awake/alert Behavior During Session: Anxious Current Attention Level: Sustained Following Commands: Follows one step commands with increased time Awareness of  Deficits: Not falling to the right in sitting for second session    Extremity/Trunk Assessment Right Upper Extremity Assessment RUE ROM/Strength/Tone: Deficits RUE ROM/Strength/Tone Deficits: Old humeral fracture, thus limited ROM prior to this admission Left Upper Extremity Assessment LUE ROM/Strength/Tone: Within functional levels   Mobility Bed Mobility Bed Mobility: Supine to Sit Supine to Sit: 3: Mod assist;HOB elevated (30 degrees) Transfers Transfers: Sit to Stand;Stand to Sit Sit to Stand: 3: Mod assist;Without upper extremity assist;From bed Stand to Sit: 3: Mod assist;Without upper extremity assist;To bed   Exercise Other Exercises Other Exercises: Educated pt's son on ROM exercises for pt's LUE (digit flex/extension; elbow flexion/extension; shoulder flex/extension)  Balance    End of Session OT - End of Session Equipment Utilized During Treatment: Gait belt Activity Tolerance: Patient limited by fatigue (limited by low O2 sats on RA) Patient left: in bed;with call bell/phone within reach;with family/visitor present (son) Nurse Communication: Mobility status (knees wobbly and pt "bobbing" up/down with ambulation)       Evette Georges [phone] 10/14/2011, 6:33 PM

## 2011-10-14 NOTE — Progress Notes (Signed)
Subjective: 1 Day Post-Op Procedure(s) (LRB): OPEN REDUCTION INTERNAL FIXATION (ORIF) WRIST FRACTURE (Right) Patient reports pain as mild.   Pt drowsy but reports she has help at home. Objective: Vital signs in last 24 hours: Temp:  [97.4 F (36.3 C)-98.6 F (37 C)] 97.4 F (36.3 C) (08/01 0624) Pulse Rate:  [68-89] 89  (08/01 0624) Resp:  [11-18] 16  (08/01 0624) BP: (98-134)/(44-68) 132/65 mmHg (08/01 0624) SpO2:  [93 %-100 %] 93 % (08/01 0624)  Intake/Output from previous day: 07/31 0701 - 08/01 0700 In: 2032.5 [I.V.:1932.5; IV Piggyback:100] Out: 40 [Blood:40] Intake/Output this shift:     Basename 10/13/11 1108 10/12/11 1456 10/12/11 1417  HGB 13.4 16.3* 13.9    Basename 10/13/11 1108 10/12/11 1456 10/12/11 1417  WBC 7.7 -- 8.1  RBC 4.94 -- 5.09  HCT 42.2 48.0* --  PLT 138* -- 136*    Basename 10/13/11 1108 10/12/11 1456  NA 136 140  K 3.6 3.2*  CL 99 101  CO2 30 --  BUN 18 19  CREATININE 0.61 0.80  GLUCOSE 110* 103*  CALCIUM 8.8 --    Basename 10/13/11 1108  LABPT --  INR 0.94   Right Upper Extremity: Neurovascular intact Sensation intact distally Incision: dressing C/D/I Capillary refill intact  Assessment/Plan: 1 Day Post-Op Procedure(s) (LRB): OPEN REDUCTION INTERNAL FIXATION (ORIF) WRIST FRACTURE (Right) Advance diet Up with therapy D/C IV fluids Discharge today to home with family OV 1 week Rx vicodin Keep right hand elevated and no weight on right hand or arm.  Sling for home use Keep splint dry and clean. COD stable On Keflex for UTI started by ED MD on 7/30.  Continue RX as directed  Kasheem Toner M 10/14/2011, 8:59 AM

## 2011-10-14 NOTE — Anesthesia Postprocedure Evaluation (Signed)
  Anesthesia Post-op Note  Patient: [name] [name]  Procedure(s) Performed: Procedure(s) (LRB): OPEN REDUCTION INTERNAL FIXATION (ORIF) WRIST FRACTURE (Right)  Patient Location: PACU  Anesthesia Type: General with block for POP  Level of Consciousness: awake, alert  and oriented  Airway and Oxygen Therapy: Patient Spontanous Breathing  Post-op Pain: mild  Post-op Assessment: Post-op Vital signs reviewed  Post-op Vital Signs: Reviewed  Complications: No apparent anesthesia complications

## 2011-10-15 NOTE — Progress Notes (Signed)
CARE MANAGEMENT NOTE 10/15/2011  Patient:  [name] [name], [name] [name]   Account Number:  [id number]  Date Initiated:  10/14/2011  Documentation initiated by:  Vance Peper  Subjective/Objective Assessment:   71 yr old female s/p ORIF of right distal radius     Action/Plan:   CM spoke with patient's son regarding needs at discharge.States his mom will go home with him and she has rolling walker.   Anticipated DC Date:  10/15/2011   Anticipated DC Plan:  HOME W HOME HEALTH SERVICES      DC Planning Services  CM consult      [facility] Choice  HOME HEALTH   Choice offered to / List presented to:  C-4 Adult Children   DME arranged  [facility]      DME agency  [facility]     HH arranged  HH-2 PT  HH-4 NURSE'S AIDE  HH-1 RN      [facility] agency  [facility]   Status of service:  Completed, signed off Medicare Important Message given?   (If response is "NO", the following Medicare IM given date fields will be blank) Date Medicare IM given:   Date Additional Medicare IM given:    Discharge Disposition:  HOME W HOME HEALTH SERVICES  Per UR Regulation:    If discussed at Long Length of Stay Meetings, dates discussed:    Comments:  10/15/11 1457 Vance Peper, RN BSN Case Manager Patient will go home on oxygen @ 2Liters N/C. Contacted [facility], Advanced liasion to inform for [facility] nursing.Oxygen ordered.

## 2011-10-15 NOTE — Progress Notes (Signed)
Occupational Therapy Treatment Patient Details Name: [name] [name] MRN: [id number] DOB: [birth year] Today's Date: 10/15/2011 Time: 6213-0865 OT Time Calculation (min): 49 min  OT Assessment / Plan / Recommendation Comments on Treatment Session Pt is alert.  She does not give consistent answers regarding her PLOF and does not ask for assist appropriately.  Pt states she wants to remain in the hospital until she is able to help herself.  Offered pt and son the option of SNF for ST rehab, but both declined.  Recommend HHOT and PT and 24 hour supervision.    Follow Up Recommendations  Home health OT;Supervision/Assistance - 24 hour    Barriers to Discharge       Equipment Recommendations  None recommended by OT    Recommendations for Other Services    Frequency Min 3X/week   Plan Discharge plan remains appropriate    Precautions / Restrictions Precautions Precautions: Fall Restrictions Weight Bearing Restrictions: Yes RUE Weight Bearing: Non weight bearing   Pertinent Vitals/Pain     ADL  Eating/Feeding: Performed;Set up Where Assessed - Eating/Feeding: Chair Grooming: Performed;Wash/dry hands;Minimal assistance Where Assessed - Grooming: Supported sitting Upper Body Bathing: Performed;Minimal assistance Where Assessed - Upper Body Bathing: Unsupported sitting Upper Body Dressing: Performed;Minimal assistance Where Assessed - Upper Body Dressing: Unsupported sitting Toilet Transfer: Minimal assistance;Performed Toilet Transfer Method: Stand pivot Acupuncturist: Bedside commode Toileting - Clothing Manipulation and Hygiene: Performed;+1 Total assistance Where Assessed - Engineer, mining and Hygiene: Standing Equipment Used: Gait belt Transfers/Ambulation Related to ADLs: min assist ADL Comments: Pt's son reports that pt is independent in self care and light meal prep.  He assists with cleaning her home.  He also states that pt has a hx of poor  nutrition and does not drink adequately.  Son is willing to assist pt with personal care and pt is willing to allow it.  Both are declining SNF and are requesting HH therapies.    OT Diagnosis:    OT Problem List:   OT Treatment Interventions:     OT Goals Acute Rehab OT Goals Time For Goal Achievement: 10/21/11 ADL Goals Pt Will Perform Eating: with set-up;with supervision;Supported;Sitting, chair;Sitting, edge of bed;Supine, head of bed up ADL Goal: Eating - Progress: Met Pt Will Perform Grooming: with set-up;with supervision;Supported;Sitting, edge of bed;Sitting, chair ADL Goal: Grooming - Progress: Progressing toward goals Pt Will Transfer to Toilet: with min assist;Ambulation;Comfort height toilet ADL Goal: Toilet Transfer - Progress: Progressing toward goals Pt Will Perform Toileting - Clothing Manipulation: with min assist;Standing ADL Goal: Toileting - Clothing Manipulation - Progress: Progressing toward goals Pt Will Perform Toileting - Hygiene: with min assist;Sit to stand from 3-in-1/toilet ADL Goal: Toileting - Hygiene - Progress: Not met Miscellaneous OT Goals Miscellaneous OT Goal #1: Family will veerbalize how they are to A opt with bathing and dressing OT Goal: Miscellaneous Goal #1 - Progress: Progressing toward goals  Visit Information  Last OT Received On: 10/15/11 Assistance Needed: +1    Subjective Data      Prior Functioning       Cognition  Overall Cognitive Status: Impaired Area of Impairment: Awareness of deficits;Problem solving;Safety/judgement Arousal/Alertness: Awake/alert Behavior During Session: [facility] for tasks performed Following Commands: Follows one step commands consistently Safety/Judgement: Decreased awareness of safety precautions;Decreased awareness of need for assistance Safety/Judgement - Other Comments: pt laying in urine, aware of this but did not call for assist    Mobility Bed Mobility Bed Mobility: Rolling Left;Left Sidelying to  Sit;Sitting - Scoot  to Edge of Bed Rolling Left: 4: Min assist;With rail Left Sidelying to Sit: 4: Min assist;HOB flat;With rails Sitting - Scoot to Edge of Bed: 4: Min assist Transfers Transfers: Sit to Stand;Stand to Sit Sit to Stand: 4: Min assist;With upper extremity assist;From bed;From chair/3-in-1 Stand to Sit: 4: Min assist;To chair/3-in-1   Exercises    Balance    End of Session OT - End of Session Equipment Utilized During Treatment: Gait belt Patient left: in chair;with call bell/phone within reach Nurse Communication: Mobility status  GO     Evern Bio 10/15/2011, 1:00 PM [phone]

## 2011-10-15 NOTE — Progress Notes (Signed)
Orthopedic Tech Progress Note Patient Details:  [name] [name] [birth year] [id number]  Ortho Devices Type of Ortho Device: Arm foam sling Ortho Device/Splint Location: (R) UE Ortho Device/Splint Interventions: Application   Cammer, Mickie Bail 10/15/2011, 11:05 AM

## 2011-10-15 NOTE — Progress Notes (Addendum)
Patient attempted to placed on room air for discharge.  Patient desaturated to 81% at rest and continued to stay at that level for 10 seconds.  On 2L  patient maintained oxygen saturation above 92%.  Home O2 will be ordered by care management team.  Patient son made aware.  Tarik Teixeira N

## 2011-10-15 NOTE — ED Provider Notes (Signed)
Medical screening examination/treatment/procedure(s) were performed by non-physician practitioner and as supervising physician I was immediately available for consultation/collaboration.  Shalaunda Weatherholtz R. Larron Armor, MD 10/15/11 1053 

## 2011-10-15 NOTE — Progress Notes (Signed)
Subjective: 2 Days Post-Op Procedure(s) (LRB): OPEN REDUCTION INTERNAL FIXATION (ORIF) WRIST FRACTURE (Right) Patient reports pain as 0 on 0-10 scale.  Pt awake and alert and sitting in chair.  Discussed option of short term NH and she prefers to go home with her son. Will need to continue oxygen use at home.  Objective: Vital signs in last 24 hours: Temp:  [97.7 F (36.5 C)-99 F (37.2 C)] 97.7 F (36.5 C) (08/02 0538) Pulse Rate:  [91-121] 91  (08/02 0538) Resp:  [16-18] 16  (08/02 0538) BP: (122-135)/(60-67) 135/67 mmHg (08/02 0538) SpO2:  [61 %-100 %] 91 % (08/02 0538)  Intake/Output from previous day: 08/01 0701 - 08/02 0700 In: 1020 [P.O.:120; I.V.:900] Out: 200 [Urine:200] Intake/Output this shift:     Basename 10/13/11 1108 10/12/11 1456 10/12/11 1417  HGB 13.4 16.3* 13.9    Basename 10/13/11 1108 10/12/11 1456 10/12/11 1417  WBC 7.7 -- 8.1  RBC 4.94 -- 5.09  HCT 42.2 48.0* --  PLT 138* -- 136*    Basename 10/13/11 1108 10/12/11 1456  NA 136 140  K 3.6 3.2*  CL 99 101  CO2 30 --  BUN 18 19  CREATININE 0.61 0.80  GLUCOSE 110* 103*  CALCIUM 8.8 --    Basename 10/13/11 1108  LABPT --  INR 0.94    Neurovascular intact Sensation intact distally Incision: dressing C/D/I  Assessment/Plan: 2 Days Post-Op Procedure(s) (LRB): OPEN REDUCTION INTERNAL FIXATION (ORIF) WRIST FRACTURE (Right) Discharge home with home health Arrange for OT and PT as well as bath aide at home Continuous O2 at home.   OV 1 week  Yamilee Harmes M 10/15/2011, 12:19 PM

## 2011-10-16 NOTE — Progress Notes (Signed)
Discharge instructions reviewed with patient son and his wife.  Patient stated multiple times to attending provider, OT, Care management the desire to take his mother home to take care of her versus the recommendation of her pursuing rehabilitation at a SNF.  Pt also stated the desire to go home with her son.  Nasal cannula 2 liters was set up for home use prior to patient departure.  Patient left with home use oxygen on.  Order for bedside commode was also faxed over to [facility] per patient son request.  [facility] contacted by phone stated they had received the fax and that medicare covers for diagnosis listed for the patient.  All belongings were taken home by the patient son.  Patient son and wife verbalized understanding regarding discharge instructions, [facility], and prescription for pain medicine.  Pt son was instructed to call surgeons office should any issues arise prior to office visit.  Marialuisa Basara N

## 2011-10-27 NOTE — Discharge Summary (Signed)
Discharge Summary > 48 Hours  Document/Dictate  10/28/2011      Message:       [name] [name], if you would, please copy and paste your discharge summary onto a new discharge template, and assign it again to Dr Ophelia Charter so he can co-sign it. Thank you            Edited Note Info       Biomedical engineer Type  Type  Filed    Durenda Guthrie, RN  Orthopedics  Registered Nurse  D/C Summaries  (incomplete: 10/14/11 0906)        Physician Discharge Summary    Patient ID:  ELYSIA [name]  MRN: [id number]  DOB/AGE: 1940-04-25 71 y.o.  Admit date: 10/13/2011  Discharge date: 10/14/2011  Admission Diagnoses:  Closed fracture of right distal radius  Displaced and angulated fracture  Discharge Diagnoses:  Principal Problem:  *Closed fracture of right distal radius      Past Medical History     Diagnosis  Date     .  Hypertension      .  Myocardial infarction      .  Shortness of breath      .  Asthma      .  COPD (chronic obstructive pulmonary disease)        emphysema     .  Pneumonia        hx pneumonia ,chronic bronchitis     .  H/O blood clots      .  Chronic kidney disease        current UTI being treated     .  GERD (gastroesophageal reflux disease)      .  Headache        hx migraines     .  Blood dyscrasia        hx blood clotts     .  Arthritis       Surgeries: Procedure(s):  OPEN REDUCTION INTERNAL FIXATION (ORIF) WRIST FRACTURE on 10/13/2011. Right distal radius with displacement and angulation  Consultants (if any): none  Discharged Condition: Improved  Hospital Course: [name] [name] is an 71 y.o. female who was admitted 10/13/2011 with a diagnosis of Closed fracture of right distal radius and went to the operating room on 10/13/2011 and underwent the above named procedures.  She was given perioperative antibiotics:     Anti-infectives       Start    Dose/Rate  Route  Frequency  Ordered  Stop       10/13/11 2200    cephALEXin (KEFLEX) capsule 500 mg  500 mg  Oral  3  times daily  10/13/11 1841                      10/13/11 2200    ceFAZolin (ANCEF) IVPB 1 g/50 mL premix  1 g  100 mL/hr over 30 Minutes  Intravenous  Every 6 hours  10/13/11 1841  10/14/11 1559                     10/13/11 1053    ceFAZolin (ANCEF) IVPB 2 g/50 mL premix  2 g  100 mL/hr over 30 Minutes  Intravenous  60 min pre-op  10/13/11 1053  10/13/11 1552                            .  She was given sequential compression devices, early ambulation.  She benefited maximally from the hospital stay and there were no complications.  Recent vital signs:     Filed Vitals:      10/14/11 0624     BP:  132/65     Pulse:  89     Temp:  97.4 F (36.3 C)     Resp:  16      Recent laboratory studies:     Lab Results     Component  Value  Date      HGB  13.4  10/13/2011      HGB  16.3*  10/12/2011      HGB  13.9  10/12/2011         Lab Results     Component  Value  Date      WBC  7.7  10/13/2011      PLT  138*  10/13/2011         Lab Results     Component  Value  Date      INR  0.94  10/13/2011         Lab Results     Component  Value  Date      NA  136  10/13/2011      K  3.6  10/13/2011      CL  99  10/13/2011      CO2  30  10/13/2011      BUN  18  10/13/2011      CREATININE  0.61  10/13/2011      GLUCOSE  110*  10/13/2011      Discharge Medications:  Medication List  As of 10/14/2011 9:06 AM    TAKE these medications          amLODipine 10 MG tablet      Commonly known as: NORVASC      Take 10 mg by mouth daily.      aspirin EC 81 MG tablet      Take 81 mg by mouth daily.      cephALEXin 500 MG capsule      Commonly known as: KEFLEX      Take 500 mg by mouth 3 (three) times daily. Starting 10/12/11 for 7 days      [employer] LEG CRAMPS PAIN RELIEF Tabs      Take 1 tablet by mouth daily as needed. For leg cramps      dextromethorphan 15 MG/5ML syrup      Take 15 mLs by mouth 4 (four) times daily as needed. For cough      docusate sodium 100 MG capsule      Commonly known as: COLACE       Take 100 mg by mouth daily as needed. For constipation      HYDROcodone-acetaminophen 5-325 MG per tablet      Commonly known as: NORCO/VICODIN      Take 1-2 tablets by mouth every 4 (four) hours as needed.      ibuprofen 200 MG tablet      Commonly known as: ADVIL,MOTRIN      Take 600 mg by mouth every 6 (six) hours as needed. For pain      loperamide 2 MG capsule      Commonly known as: IMODIUM      Take 2 mg by mouth 4 (four) times daily as needed. For diarrhea.      meclizine 25 MG tablet  Commonly known as: ANTIVERT      Take 25 mg by mouth every 4 (four) hours as needed. For dizziness      metoprolol succinate 50 MG 24 hr tablet      Commonly known as: TOPROL-XL      Take 25 mg by mouth daily. Take with or immediately following a meal.      omeprazole 20 MG capsule      Commonly known as: PRILOSEC      Take 20 mg by mouth daily.      oxyCODONE-acetaminophen 5-325 MG per tablet      Commonly known as: PERCOCET/ROXICET      Take 1 tablet by mouth every 4 (four) hours as needed for pain.      pravastatin 80 MG tablet      Commonly known as: PRAVACHOL      Take 80 mg by mouth at bedtime.      TOVIAZ 4 MG Tb24      Generic drug: fesoterodine      Take 4 mg by mouth at bedtime.      traMADol 50 MG tablet      Commonly known as: ULTRAM      Take 50-100 mg by mouth every 6 (six) hours as needed. For back pain      vitamin B-12 500 MCG tablet      Commonly known as: CYANOCOBALAMIN      Take 500 mcg by mouth daily.        Diagnostic Studies: Dg Chest 2 View  10/13/2011 *RADIOLOGY REPORT* Clinical Data: Preop right wrist fracture.Hypertension, shortness of breath, COPD, smoker. CHEST - 2 VIEW Comparison: 10/12/2011 Findings: There is hyperinflation of the lungs compatible with COPD. There is a scarring in the upper lobes bilaterally, unchanged. Heart is normal size. No effusions. No acute bony abnormality. IMPRESSION: COPD/chronic changes. No active disease or change. Original  Report Authenticated By: Cyndie Chime, M.D.  Dg Ribs Unilateral W/chest Right  10/12/2011 *RADIOLOGY REPORT* Clinical Data: Fall and right lower rib pain. RIGHT RIBS AND CHEST - 3+ VIEW Comparison: Chest radiograph 05/06/2010 Findings: Chest radiograph demonstrates chronic changes in the upper lungs, right side greater than left. However, there are increased vague densities in the upper lungs and cannot exclude areas of nodularity. Postsurgical changes in the right upper lung. Heart and mediastinum are stable. Trachea is midline. No evidence for pneumothorax. There may be an old right fourth rib fracture. There is concern for a nondisplaced fracture involving the right seventh rib. Evidence for an old proximal right humeral fracture. IMPRESSION: Question a nondisplaced fracture involving the right seventh rib. Old right fourth rib fracture and old right proximal humeral fracture. Increased vague densities in the upper lungs, right side greater than left. Some of these findings are related to chronic changes and postsurgical changes. However, new areas of nodularity cannot be excluded. Recommend close follow-up with PA and lateral chest views or a chest CT for further evaluation. Original Report Authenticated By: Richarda Overlie, M.D.  Dg Wrist Complete Right  10/12/2011 *RADIOLOGY REPORT* Clinical Data: Fall and wrist pain. RIGHT WRIST - COMPLETE 3+ VIEW Comparison: None. Findings: Four views of the wrist demonstrate a displaced fracture involving the distal radius. There is volar angulation of the distal fragment. It is unclear if the fracture involves the wrist articulating surface. The distal ulna appears to be intact and there is diffuse osteopenia. Carpal bones are intact. IMPRESSION: Displaced fracture of the distal right radius. Original Report  Authenticated By: Richarda Overlie, M.D.  Dg Ankle Complete Right  10/12/2011 *RADIOLOGY REPORT* Clinical Data: Fall and scratches to the ankle. RIGHT ANKLE - COMPLETE 3+ VIEW  Comparison: None. Findings: Three views of the right ankle were obtained. There is diffuse osteopenia. Negative for acute fracture or dislocation. There are calcaneal spurs. Difficult to exclude mild soft tissue swelling. IMPRESSION: Diffuse osteopenia without acute bony abnormality. Original Report Authenticated By: Richarda Overlie, M.D.  Ct Head Wo Contrast  10/12/2011 *RADIOLOGY REPORT* Clinical Data: Fall. Right supraorbital abrasions and contusions. No loss of consciousness. CT HEAD WITHOUT CONTRAST Technique: Contiguous axial images were obtained from the base of the skull through the vertex without contrast. Comparison: CT head without contrast 04/05/2007 at [facility]. Findings: Mild generalized atrophy is present. No acute infarct, hemorrhage, or mass lesion is present. The ventricles are normal size. Mild periventricular white matter hypoattenuation is similar to the prior study. There is scattered subcortical disease as well. The visualized paranasal sinuses and mastoid air cells are clear. The osseous skull is intact. No significant extra cranial soft tissue injury is evident. No skull fracture is identified. IMPRESSION: 1. No acute intracranial abnormality or significant interval change. 2. Stable atrophy and white matter disease. Original Report Authenticated By: Jamesetta Orleans. MATTERN, M.D.  Dg Hand Complete Right  10/12/2011 *RADIOLOGY REPORT* Clinical Data: Fall with pain. RIGHT HAND - COMPLETE 3+ VIEW Comparison: Right wrist 10/12/2011. Findings: Osteopenia. Impacted fracture of the distal radius is incidentally imaged. No evidence of an additional acute fracture in the hand. IMPRESSION: 1. Impacted distal radius fracture. 2. Osteopenia. Original Report Authenticated By: Reyes Ivan, M.D.  Disposition: 01-Home or Self Care  Discharge Orders    Future Orders  Please Complete By  Expires    Diet - low sodium heart healthy      Call MD / Call 911      Comments:    If you experience  chest pain or shortness of breath, CALL 911 and be transported to the hospital emergency room. If you develope a fever above 101 F, pus (white drainage) or increased drainage or redness at the wound, or calf pain, call your surgeon's office.    Constipation Prevention      Comments:    Drink plenty of fluids. Prune juice may be helpful. You may use a stool softener, such as Colace (over the counter) 100 mg twice a day. Use MiraLax (over the counter) for constipation as needed.    Increase activity slowly as tolerated      Discharge instructions      Comments:    Keep splint dry and clean at all times. Elevate hand above heart when at rest. Move fingers frequently. Wear sling when out of bed. Ice packs to wrist as needed. No weight bearing on right arm    Lifting restrictions      Comments:    No lifting with right arm      Follow-up Information    Follow up with YATES,MARK C, MD. Schedule an appointment as soon as possible for a visit in 1 week.    Contact information:    [facility]  [address]  [place] [address]  [phone]        Signed:  Wende Neighbors  10/14/2011, 9:06 AM

## 2011-11-01 ENCOUNTER — Other Ambulatory Visit: Payer: Self-pay | Admitting: Family Medicine

## 2011-11-01 DIAGNOSIS — Z1231 Encounter for screening mammogram for malignant neoplasm of breast: Secondary | ICD-10-CM

## 2011-11-29 ENCOUNTER — Ambulatory Visit: Payer: Medicare Other

## 2011-12-20 ENCOUNTER — Ambulatory Visit: Payer: Medicare Other

## 2012-01-28 ENCOUNTER — Emergency Department (HOSPITAL_COMMUNITY): Payer: Medicare Other

## 2012-01-28 ENCOUNTER — Encounter (HOSPITAL_COMMUNITY): Payer: Self-pay | Admitting: Emergency Medicine

## 2012-01-28 ENCOUNTER — Inpatient Hospital Stay (HOSPITAL_COMMUNITY)
Admission: EM | Admit: 2012-01-28 | Discharge: 2012-02-03 | DRG: 470 | Disposition: A | Discharge status: Rehabilitation Facility | Payer: Medicare Other | Attending: Internal Medicine | Admitting: Internal Medicine

## 2012-01-28 DIAGNOSIS — I4891 Unspecified atrial fibrillation: Secondary | ICD-10-CM | POA: Diagnosis present

## 2012-01-28 DIAGNOSIS — S2231XA Fracture of one rib, right side, initial encounter for closed fracture: Secondary | ICD-10-CM

## 2012-01-28 DIAGNOSIS — N189 Chronic kidney disease, unspecified: Secondary | ICD-10-CM | POA: Diagnosis present

## 2012-01-28 DIAGNOSIS — I498 Other specified cardiac arrhythmias: Secondary | ICD-10-CM | POA: Diagnosis present

## 2012-01-28 DIAGNOSIS — Y998 Other external cause status: Secondary | ICD-10-CM

## 2012-01-28 DIAGNOSIS — I129 Hypertensive chronic kidney disease with stage 1 through stage 4 chronic kidney disease, or unspecified chronic kidney disease: Secondary | ICD-10-CM | POA: Diagnosis present

## 2012-01-28 DIAGNOSIS — Z79899 Other long term (current) drug therapy: Secondary | ICD-10-CM

## 2012-01-28 DIAGNOSIS — D649 Anemia, unspecified: Secondary | ICD-10-CM | POA: Diagnosis present

## 2012-01-28 DIAGNOSIS — J449 Chronic obstructive pulmonary disease, unspecified: Secondary | ICD-10-CM | POA: Diagnosis present

## 2012-01-28 DIAGNOSIS — I10 Essential (primary) hypertension: Secondary | ICD-10-CM

## 2012-01-28 DIAGNOSIS — S2249XA Multiple fractures of ribs, unspecified side, initial encounter for closed fracture: Secondary | ICD-10-CM | POA: Diagnosis present

## 2012-01-28 DIAGNOSIS — S72009A Fracture of unspecified part of neck of unspecified femur, initial encounter for closed fracture: Principal | ICD-10-CM

## 2012-01-28 DIAGNOSIS — I252 Old myocardial infarction: Secondary | ICD-10-CM

## 2012-01-28 DIAGNOSIS — W07XXXA Fall from chair, initial encounter: Secondary | ICD-10-CM | POA: Diagnosis present

## 2012-01-28 DIAGNOSIS — D696 Thrombocytopenia, unspecified: Secondary | ICD-10-CM | POA: Diagnosis present

## 2012-01-28 DIAGNOSIS — I480 Paroxysmal atrial fibrillation: Secondary | ICD-10-CM | POA: Diagnosis not present

## 2012-01-28 DIAGNOSIS — M1990 Unspecified osteoarthritis, unspecified site: Secondary | ICD-10-CM | POA: Diagnosis present

## 2012-01-28 DIAGNOSIS — Z7982 Long term (current) use of aspirin: Secondary | ICD-10-CM

## 2012-01-28 DIAGNOSIS — I2510 Atherosclerotic heart disease of native coronary artery without angina pectoris: Secondary | ICD-10-CM | POA: Diagnosis present

## 2012-01-28 DIAGNOSIS — N390 Urinary tract infection, site not specified: Secondary | ICD-10-CM | POA: Diagnosis present

## 2012-01-28 DIAGNOSIS — J4489 Other specified chronic obstructive pulmonary disease: Secondary | ICD-10-CM | POA: Diagnosis present

## 2012-01-28 DIAGNOSIS — S72001A Fracture of unspecified part of neck of right femur, initial encounter for closed fracture: Secondary | ICD-10-CM

## 2012-01-28 DIAGNOSIS — W010XXA Fall on same level from slipping, tripping and stumbling without subsequent striking against object, initial encounter: Secondary | ICD-10-CM | POA: Diagnosis present

## 2012-01-28 DIAGNOSIS — F17200 Nicotine dependence, unspecified, uncomplicated: Secondary | ICD-10-CM | POA: Diagnosis present

## 2012-01-28 DIAGNOSIS — W19XXXA Unspecified fall, initial encounter: Secondary | ICD-10-CM

## 2012-01-28 LAB — CBC WITH DIFFERENTIAL/PLATELET
Basophils Absolute: 0 10*3/uL (ref 0.0–0.1)
Basophils Relative: 0 % (ref 0–1)
HCT: 44.5 % (ref 36.0–46.0)
Lymphocytes Relative: 8 % — ABNORMAL LOW (ref 12–46)
MCHC: 31.9 g/dL (ref 30.0–36.0)
Monocytes Absolute: 0.7 10*3/uL (ref 0.1–1.0)
Neutro Abs: 8.1 10*3/uL — ABNORMAL HIGH (ref 1.7–7.7)
Neutrophils Relative %: 85 % — ABNORMAL HIGH (ref 43–77)
RDW: 16.2 % — ABNORMAL HIGH (ref 11.5–15.5)
WBC: 9.5 10*3/uL (ref 4.0–10.5)

## 2012-01-28 LAB — BASIC METABOLIC PANEL
CO2: 29 mEq/L (ref 19–32)
Chloride: 100 mEq/L (ref 96–112)
GFR calc Af Amer: >90 mL/min (ref >90)
Potassium: 4.3 mEq/L (ref 3.5–5.1)
Sodium: 137 mEq/L (ref 135–145)

## 2012-01-28 LAB — CK: Total CK: 27 U/L (ref 7–177)

## 2012-01-28 MED ORDER — SODIUM CHLORIDE 0.9 % IV BOLUS (SEPSIS)
250.0000 mL | Freq: Once | INTRAVENOUS | Status: AC
  Administered 2012-01-28: 250 mL via INTRAVENOUS

## 2012-01-28 MED ORDER — ONDANSETRON HCL 4 MG/2ML IJ SOLN
4.0000 mg | Freq: Once | INTRAMUSCULAR | Status: AC
  Administered 2012-01-28: 4 mg via INTRAVENOUS
  Filled 2012-01-28: qty 2

## 2012-01-28 MED ORDER — MORPHINE SULFATE 4 MG/ML IJ SOLN
4.0000 mg | Freq: Once | INTRAMUSCULAR | Status: AC
  Administered 2012-01-28: 4 mg via INTRAVENOUS
  Filled 2012-01-28: qty 1

## 2012-01-28 MED ORDER — HYDROCODONE-ACETAMINOPHEN 5-325 MG PO TABS
1.0000 | ORAL_TABLET | Freq: Once | ORAL | Status: AC
  Administered 2012-01-28: 1 via ORAL
  Filled 2012-01-28: qty 1

## 2012-01-28 NOTE — ED Provider Notes (Signed)
9:34 PM Patient to move to CDU holding for admission.  Sign out received from Dr Charm Barges.  Patient with fall from standing, sustaining a right hip fracture and two right rib fractures.  Dr Gregary Cromer to discuss with orthopedics.  I will call for admission once labs have resulted.  10:35 PM Patient is reporting pain in her right hip.  States that otherwise she is feeling fine.  Patient's family notes that patient sees Dr Ophelia Charter from [facility].  I have discussed this with Dr Gregary Cromer and I will call [facility] (not currently on call) to discuss plan for patient.   Patient is breathing well, reports moderate pain in right ribs, denies SOB - O2 is 97% on 2L [place].  Right lower extremity supported on a pillow.  Dorsalis pedis pulse is intact, sensation intact.  Pt able to move extremity.    11:45 PM I have spoken with Dr Lajoyce Corners of [facility] orthopedics who will see her in the morning.  Pt does not need to be NPO as she will need medical clearance prior to decision for surgery.  I have also spoken with Dr Barron Alvine hospitalist, who agrees to admit the patient.  I have asked the flow manager to place patient on [address] [place].   Results for orders placed during the hospital encounter of 01/28/12  CBC WITH DIFFERENTIAL      Component Value Range   WBC 9.5  4.0 - 10.5 K/uL   RBC 5.69 (*) 3.87 - 5.11 MIL/uL   Hemoglobin 14.2  12.0 - 15.0 g/dL   HCT 16.1  09.6 - 04.5 %   MCV 78.2  78.0 - 100.0 fL   MCH 25.0 (*) 26.0 - 34.0 pg   MCHC 31.9  30.0 - 36.0 g/dL   RDW 40.9 (*) 81.1 - 91.4 %   Platelets 85 (*) 150 - 400 K/uL   Neutrophils Relative 85 (*) 43 - 77 %   Neutro Abs 8.1 (*) 1.7 - 7.7 K/uL   Lymphocytes Relative 8 (*) 12 - 46 %   Lymphs Abs 0.7  0.7 - 4.0 K/uL   Monocytes Relative 7  3 - 12 %   Monocytes Absolute 0.7  0.1 - 1.0 K/uL   Eosinophils Relative 0  0 - 5 %   Eosinophils Absolute 0.0  0.0 - 0.7 K/uL   Basophils Relative 0  0 - 1 %   Basophils Absolute 0.0  0.0 - 0.1  K/uL  BASIC METABOLIC PANEL      Component Value Range   Sodium 137  135 - 145 mEq/L   Potassium 4.3  3.5 - 5.1 mEq/L   Chloride 100  96 - 112 mEq/L   CO2 29  19 - 32 mEq/L   Glucose, Bld 141 (*) 70 - 99 mg/dL   BUN 17  6 - 23 mg/dL   Creatinine, Ser 7.82  0.50 - 1.10 mg/dL   Calcium 9.4  8.4 - 95.6 mg/dL   GFR calc non Af Amer 88 (*) >90 mL/min   GFR calc Af Amer >90  >90 mL/min  CK      Component Value Range   Total CK 27  7 - 177 U/L   Dg Ribs Unilateral W/chest Right  01/28/2012  *RADIOLOGY REPORT*  Clinical Data: Fall, rib pain, shortness of breath  RIGHT RIBS AND CHEST - 3+ VIEW  Comparison: 10/13/2011  Findings: Chronic interstitial markings/emphysematous changes. Biapical pleural parenchymal scarring. Postsurgical changes/suture lines in the right upper lung.  Cardiomediastinal silhouette is within normal limits.  Suspected right lateral 5th and 6th rib fractures.  IMPRESSION: Suspected right lateral 5th and 6th rib fractures.   Original Report Authenticated By: Charline Bills, M.D.    Dg Hip Complete Right  01/28/2012  *RADIOLOGY REPORT*  Clinical Data: Fall, right hip pain  RIGHT HIP - COMPLETE 2+ VIEW  Comparison: 06/13/2006  Findings: Subcapital right hip fracture with secondary varus deformity/foreshortening.  Prior ORIF of a proximal femur fracture with associated deformity.  IMPRESSION: Subcapital right hip fracture.   Original Report Authenticated By: Charline Bills, M.D.       [place], [place] 01/29/12 0004

## 2012-01-28 NOTE — ED Provider Notes (Signed)
History     CSN: [id number]  Arrival date & time 01/28/12  2130   First MD Initiated Contact with Patient 01/28/12 1836      Chief Complaint  Patient presents with  . Fall  . Hip Pain    (Consider location/radiation/quality/duration/timing/severity/associated sxs/prior treatment) HPI Patient reports a fall today around noon. She lives at home by herself in a retirement complex. She was moving near her chair and fell straight down on the floor. Someone from [employer] came to her house and helped her to her chair. Her son stopped by several hours later and found her still in her chair. Called 911 to bring her here. Patient complains of right hip pain and right rib pain. She states it is severe in intensity. There is no radiation. This occurred suddenly and has been unchanged. She has been unable to walk since that time. Associated symptoms include no head injury. No prodromal symptoms before the fall. She has received no prior treatment.  Past Medical History  Diagnosis Date  . Hypertension   . Myocardial infarction   . Shortness of breath   . Asthma   . COPD (chronic obstructive pulmonary disease)     emphysema    . Pneumonia     hx pneumonia ,chronic bronchitis   . H/O blood clots   . Chronic kidney disease     current   UTI   being treated  . GERD (gastroesophageal reflux disease)   . Headache     hx migraines  . Blood dyscrasia     hx blood clotts  . Arthritis     Past Surgical History  Procedure Date  . Right leg   . Hip fracture surgery     RIGHT SIDE  . Shoulder surgery     RIGHT  . Cholecystectomy   . Eye surgery     BIL CATARACT   . Orif wrist fracture 10/13/2011    Procedure: OPEN REDUCTION INTERNAL FIXATION (ORIF) WRIST FRACTURE;  Surgeon: Eldred Manges, MD;  Location: [facility] OR;  Service: Orthopedics;  Laterality: Right;  Open Reduction Internal Fixation Right Distal Radius     No family history on file.  History  Substance Use Topics  . Smoking  status: Current Every Day Smoker  . Smokeless tobacco: Not on file  . Alcohol Use: No    OB History    Grav Para Term Preterm Abortions TAB SAB Ect Mult Living                  Review of Systems Constitutional: Negative for fever.  Eyes: Negative for vision loss.  ENT: Negative for difficulty swallowing.  Cardiovascular: Negative for chest pain. Respiratory: Negative for respiratory distress.  Gastrointestinal:  Negative for vomiting.  Genitourinary: Negative for inability to void.  Musculoskeletal: Positive for gait problem.  Integumentary: Negative for rash.  Neurological: Negative for change in mental status.      Allergies  Review of patient's allergies indicates no known allergies.  Home Medications   Current Outpatient Rx  Name  Route  Sig  Dispense  Refill  . AMLODIPINE BESYLATE 10 MG PO TABS   Oral   Take 10 mg by mouth daily.         . ASPIRIN EC 81 MG PO TBEC   Oral   Take 81 mg by mouth daily.         Marland Kitchen DEXTROMETHORPHAN HBR 15 MG/5ML PO SYRP   Oral   Take 15  mLs by mouth 4 (four) times daily as needed. For cough         . DOCUSATE SODIUM 100 MG PO CAPS   Oral   Take 100 mg by mouth daily as needed. For constipation         . FESOTERODINE FUMARATE ER 4 MG PO TB24   Oral   Take 4 mg by mouth at bedtime.         . [employer] LEG CRAMPS PAIN RELIEF PO TABS   Oral   Take 1 tablet by mouth daily as needed. For leg cramps         . IBUPROFEN 200 MG PO TABS   Oral   Take 600 mg by mouth every 6 (six) hours as needed. For pain         . LOPERAMIDE HCL 2 MG PO CAPS   Oral   Take 2 mg by mouth 4 (four) times daily as needed. For diarrhea.         Marland Kitchen MECLIZINE HCL 25 MG PO TABS   Oral   Take 25 mg by mouth every 4 (four) hours as needed. For dizziness         . METOPROLOL SUCCINATE ER 50 MG PO TB24   Oral   Take 25 mg by mouth daily. Take with or immediately following a meal.         . OMEPRAZOLE 20 MG PO CPDR   Oral   Take 20 mg by  mouth daily.         Marland Kitchen PRAVASTATIN SODIUM 80 MG PO TABS   Oral   Take 80 mg by mouth at bedtime.         . TRAMADOL HCL 50 MG PO TABS   Oral   Take 50-100 mg by mouth every 6 (six) hours as needed. For back pain         . VITAMIN B-12 500 MCG PO TABS   Oral   Take 500 mcg by mouth daily.           BP 116/66  Pulse 91  Temp 99.5 F (37.5 C) (Oral)  Resp 19  SpO2 90%  Physical Exam Nursing note and vitals reviewed.  Constitutional: Pt is alert and appears stated age. Eyes: No injection, no scleral icterus. HENT: Atraumatic, airway open without erythema or exudate.  Respiratory: No respiratory distress. Equal breathing bilaterally. Cardiovascular: Normal rate. Extremities warm and well perfused.  Abdomen: Soft, non-tender. MSK: R rib cage tender to palpation. R hip tender to palpation. Pt with limited ROM. N/V intact.  Skin: No rash, no wounds.   Neuro: No motor nor sensory deficit.     ED Course  Procedures (including critical care time)   Labs Reviewed  CBC WITH DIFFERENTIAL  BASIC METABOLIC PANEL  CK  URINALYSIS, DIPSTICK ONLY   Dg Ribs Unilateral W/chest Right  01/28/2012  *RADIOLOGY REPORT*  Clinical Data: Fall, rib pain, shortness of breath  RIGHT RIBS AND CHEST - 3+ VIEW  Comparison: 10/13/2011  Findings: Chronic interstitial markings/emphysematous changes. Biapical pleural parenchymal scarring. Postsurgical changes/suture lines in the right upper lung.  Cardiomediastinal silhouette is within normal limits.  Suspected right lateral 5th and 6th rib fractures.  IMPRESSION: Suspected right lateral 5th and 6th rib fractures.   Original Report Authenticated By: Charline Bills, M.D.    Dg Hip Complete Right  01/28/2012  *RADIOLOGY REPORT*  Clinical Data: Fall, right hip pain  RIGHT HIP - COMPLETE 2+ VIEW  Comparison:  06/13/2006  Findings: Subcapital right hip fracture with secondary varus deformity/foreshortening.  Prior ORIF of a proximal femur fracture  with associated deformity.  IMPRESSION: Subcapital right hip fracture.   Original Report Authenticated By: Charline Bills, M.D.      1. Closed right hip fracture   2. Closed fracture of rib of right side       MDM  71 y.o. female w/ PMHx of HTN, CAD, CKD, COPD, prior orthopedic injuries presents w/ with R hip pain, R rib pain after fall from standing. X-rays done in triage show suspected right fifth and sixth rib fractures. Patient is here without any respiratory distress. She is supposed to wear oxygen at home but rarely does according to the son for monetary reasons. He or she requires 2 L via nasal cannula. Very stable. Right hip x-ray shows subcapital right hip fracture. Plan to send labs. Will give IV pain medicine. Ortho has been consult to and will await call back. Plan to be admitted to medicine. Pending return of lab results and admission patient moved to CDU. Report called.      I independently viewed, interpreted, and used in my medical decision making all ordered lab and imaging tests. Medical Decision Making discussed with ED attending Gavin Pound. Oletta Lamas, MD          Charm Barges, MD 01/28/12 [phone]

## 2012-01-28 NOTE — ED Notes (Signed)
Patient lives in a retirement complex fell onto floor was assisted to chair no complaints. At 1730 complaint of right hip pain and right rib pain per EMS.

## 2012-01-28 NOTE — ED Notes (Signed)
Patient transported to X-ray 

## 2012-01-28 NOTE — ED Provider Notes (Signed)
  I saw and evaluated the patient, reviewed the resident's note and I agree with the findings and plan.  Pt with mechanical fall.  Rib fractures, hip fractures.  Will need admission, ortho evaluation, likely surgery.  Pain control for above.  No evidence of syncope.  Ground level fall, trauma does not need to be involved at this point.    Gavin Pound. Oletta Lamas, MD 01/28/12 2330

## 2012-01-29 ENCOUNTER — Encounter (HOSPITAL_COMMUNITY): Payer: Self-pay | Admitting: Anesthesiology

## 2012-01-29 ENCOUNTER — Encounter (HOSPITAL_COMMUNITY): Payer: Self-pay | Admitting: Internal Medicine

## 2012-01-29 ENCOUNTER — Inpatient Hospital Stay (HOSPITAL_COMMUNITY): Payer: Medicare Other | Admitting: Anesthesiology

## 2012-01-29 ENCOUNTER — Encounter (HOSPITAL_COMMUNITY)
Admission: EM | Disposition: A | Discharge status: Rehabilitation Facility | Payer: Self-pay | Source: Home / Self Care | Attending: Internal Medicine

## 2012-01-29 ENCOUNTER — Inpatient Hospital Stay (HOSPITAL_COMMUNITY): Payer: Medicare Other

## 2012-01-29 DIAGNOSIS — S72001A Fracture of unspecified part of neck of right femur, initial encounter for closed fracture: Secondary | ICD-10-CM | POA: Diagnosis present

## 2012-01-29 DIAGNOSIS — I10 Essential (primary) hypertension: Secondary | ICD-10-CM | POA: Diagnosis present

## 2012-01-29 DIAGNOSIS — M1990 Unspecified osteoarthritis, unspecified site: Secondary | ICD-10-CM | POA: Diagnosis present

## 2012-01-29 DIAGNOSIS — W010XXA Fall on same level from slipping, tripping and stumbling without subsequent striking against object, initial encounter: Secondary | ICD-10-CM | POA: Diagnosis present

## 2012-01-29 DIAGNOSIS — J449 Chronic obstructive pulmonary disease, unspecified: Secondary | ICD-10-CM | POA: Diagnosis present

## 2012-01-29 DIAGNOSIS — N189 Chronic kidney disease, unspecified: Secondary | ICD-10-CM | POA: Diagnosis present

## 2012-01-29 DIAGNOSIS — S2249XA Multiple fractures of ribs, unspecified side, initial encounter for closed fracture: Secondary | ICD-10-CM | POA: Diagnosis present

## 2012-01-29 DIAGNOSIS — I2510 Atherosclerotic heart disease of native coronary artery without angina pectoris: Secondary | ICD-10-CM | POA: Diagnosis present

## 2012-01-29 HISTORY — PX: HARDWARE REMOVAL: SHX979

## 2012-01-29 HISTORY — PX: HIP ARTHROPLASTY: SHX981

## 2012-01-29 LAB — CBC
HCT: 42.6 % (ref 36.0–46.0)
Hemoglobin: 13.3 g/dL (ref 12.0–15.0)
MCH: 24.6 pg — ABNORMAL LOW (ref 26.0–34.0)
MCHC: 31.2 g/dL (ref 30.0–36.0)
MCV: 78.9 fL (ref 78.0–100.0)
RDW: 16.4 % — ABNORMAL HIGH (ref 11.5–15.5)

## 2012-01-29 LAB — URINALYSIS, DIPSTICK ONLY
Glucose, UA: NEGATIVE mg/dL
Protein, ur: 30 mg/dL — AB
Specific Gravity, Urine: 1.014 (ref 1.005–1.030)
pH: 7.5 (ref 5.0–8.0)

## 2012-01-29 LAB — BASIC METABOLIC PANEL
BUN: 17 mg/dL (ref 6–23)
CO2: 28 mEq/L (ref 19–32)
Chloride: 104 mEq/L (ref 96–112)
Creatinine, Ser: 0.58 mg/dL (ref 0.50–1.10)
Glucose, Bld: 116 mg/dL — ABNORMAL HIGH (ref 70–99)
Potassium: 4.1 mEq/L (ref 3.5–5.1)

## 2012-01-29 LAB — URINALYSIS, ROUTINE W REFLEX MICROSCOPIC
Ketones, ur: 15 mg/dL — AB
Protein, ur: 30 mg/dL — AB
Urobilinogen, UA: 1 mg/dL (ref 0.0–1.0)

## 2012-01-29 LAB — URINE MICROSCOPIC-ADD ON

## 2012-01-29 SURGERY — REMOVAL, HARDWARE
Anesthesia: General | Site: Hip | Laterality: Right | Wound class: Clean

## 2012-01-29 MED ORDER — ACETAMINOPHEN 650 MG RE SUPP: 650.0000 mg | Freq: Four times a day (QID) | RECTAL | Status: DC | PRN

## 2012-01-29 MED ORDER — LIDOCAINE HCL (CARDIAC) 20 MG/ML IV SOLN
INTRAVENOUS | Status: DC | PRN
  Administered 2012-01-29: 100 mg via INTRAVENOUS

## 2012-01-29 MED ORDER — CEFAZOLIN SODIUM 1-5 GM-% IV SOLN
1.0000 g | Freq: Four times a day (QID) | INTRAVENOUS | Status: AC
  Administered 2012-01-29 – 2012-01-30 (×3): 1 g via INTRAVENOUS
  Filled 2012-01-29 (×3): qty 50

## 2012-01-29 MED ORDER — SODIUM CHLORIDE 0.9 % IR SOLN
Status: DC | PRN
  Administered 2012-01-29: 1

## 2012-01-29 MED ORDER — ROCURONIUM BROMIDE 100 MG/10ML IV SOLN
INTRAVENOUS | Status: DC | PRN
  Administered 2012-01-29: 35 mg via INTRAVENOUS

## 2012-01-29 MED ORDER — METOCLOPRAMIDE HCL 10 MG PO TABS
5.0000 mg | ORAL_TABLET | Freq: Three times a day (TID) | ORAL | Status: DC | PRN
  Filled 2012-01-29: qty 1

## 2012-01-29 MED ORDER — HYDROMORPHONE HCL PF 1 MG/ML IJ SOLN
0.5000 mg | INTRAMUSCULAR | Status: DC | PRN
  Administered 2012-01-29: 1 mg via INTRAVENOUS
  Administered 2012-01-29: 0.5 mg via INTRAVENOUS
  Administered 2012-01-30: 1 mg via INTRAVENOUS
  Filled 2012-01-29 (×3): qty 1

## 2012-01-29 MED ORDER — NICOTINE 21 MG/24HR TD PT24
21.0000 mg | MEDICATED_PATCH | Freq: Every day | TRANSDERMAL | Status: DC
  Administered 2012-01-29 – 2012-02-03 (×6): 21 mg via TRANSDERMAL
  Filled 2012-01-29 (×6): qty 1

## 2012-01-29 MED ORDER — ACETAMINOPHEN 325 MG PO TABS
650.0000 mg | ORAL_TABLET | Freq: Four times a day (QID) | ORAL | Status: DC | PRN
  Administered 2012-01-29 – 2012-02-01 (×4): 650 mg via ORAL
  Filled 2012-01-29 (×5): qty 2

## 2012-01-29 MED ORDER — NEOSTIGMINE METHYLSULFATE 1 MG/ML IJ SOLN
INTRAMUSCULAR | Status: DC | PRN
  Administered 2012-01-29: 3 mg via INTRAVENOUS

## 2012-01-29 MED ORDER — LABETALOL HCL 5 MG/ML IV SOLN
INTRAVENOUS | Status: DC | PRN
  Administered 2012-01-29: 2.5 mg via INTRAVENOUS

## 2012-01-29 MED ORDER — ONDANSETRON HCL 4 MG PO TABS: 4.0000 mg | ORAL_TABLET | Freq: Four times a day (QID) | ORAL | Status: DC | PRN

## 2012-01-29 MED ORDER — ONDANSETRON HCL 4 MG/2ML IJ SOLN: 4.0000 mg | Freq: Four times a day (QID) | INTRAMUSCULAR | Status: DC | PRN

## 2012-01-29 MED ORDER — LACTATED RINGERS IV SOLN
INTRAVENOUS | Status: DC | PRN
  Administered 2012-01-29 (×2): via INTRAVENOUS

## 2012-01-29 MED ORDER — IPRATROPIUM BROMIDE 0.02 % IN SOLN
0.5000 mg | RESPIRATORY_TRACT | Status: DC
  Administered 2012-01-29 – 2012-01-31 (×11): 0.5 mg via RESPIRATORY_TRACT
  Filled 2012-01-29 (×11): qty 2.5

## 2012-01-29 MED ORDER — ALBUTEROL SULFATE (5 MG/ML) 0.5% IN NEBU
2.5000 mg | INHALATION_SOLUTION | RESPIRATORY_TRACT | Status: DC
  Administered 2012-01-29 – 2012-01-31 (×11): 2.5 mg via RESPIRATORY_TRACT
  Filled 2012-01-29 (×11): qty 0.5

## 2012-01-29 MED ORDER — CHLORHEXIDINE GLUCONATE 4 % EX LIQD
60.0000 mL | Freq: Once | CUTANEOUS | Status: DC
  Filled 2012-01-29: qty 60

## 2012-01-29 MED ORDER — HYDROMORPHONE HCL PF 1 MG/ML IJ SOLN
0.2500 mg | INTRAMUSCULAR | Status: DC | PRN
  Administered 2012-01-29: 0.5 mg via INTRAVENOUS

## 2012-01-29 MED ORDER — SODIUM CHLORIDE 0.9 % IV SOLN
INTRAVENOUS | Status: DC
  Administered 2012-01-29: 1000 mL via INTRAVENOUS
  Administered 2012-01-29: 01:00:00 via INTRAVENOUS

## 2012-01-29 MED ORDER — ONDANSETRON HCL 4 MG/2ML IJ SOLN: 4.0000 mg | Freq: Three times a day (TID) | INTRAMUSCULAR | Status: AC | PRN

## 2012-01-29 MED ORDER — ZOLPIDEM TARTRATE 5 MG PO TABS: 5.0000 mg | ORAL_TABLET | Freq: Every evening | ORAL | Status: DC | PRN

## 2012-01-29 MED ORDER — PHENYLEPHRINE HCL 10 MG/ML IJ SOLN
INTRAMUSCULAR | Status: DC | PRN
  Administered 2012-01-29 (×2): 80 ug via INTRAVENOUS

## 2012-01-29 MED ORDER — GLYCOPYRROLATE 0.2 MG/ML IJ SOLN
INTRAMUSCULAR | Status: DC | PRN
  Administered 2012-01-29: 0.6 mg via INTRAVENOUS

## 2012-01-29 MED ORDER — OXYCODONE HCL 5 MG PO TABS
5.0000 mg | ORAL_TABLET | ORAL | Status: DC | PRN
  Administered 2012-01-29 – 2012-02-03 (×10): 5 mg via ORAL
  Filled 2012-01-29 (×9): qty 1

## 2012-01-29 MED ORDER — CEFAZOLIN SODIUM-DEXTROSE 2-3 GM-% IV SOLR
2.0000 g | INTRAVENOUS | Status: AC
  Administered 2012-01-29: 2 g via INTRAVENOUS
  Filled 2012-01-29: qty 50

## 2012-01-29 MED ORDER — MORPHINE SULFATE 4 MG/ML IJ SOLN: 4.0000 mg | INTRAMUSCULAR | Status: DC | PRN

## 2012-01-29 MED ORDER — PROPOFOL 10 MG/ML IV BOLUS
INTRAVENOUS | Status: DC | PRN
  Administered 2012-01-29: 80 mg via INTRAVENOUS

## 2012-01-29 MED ORDER — METOCLOPRAMIDE HCL 5 MG/ML IJ SOLN
5.0000 mg | Freq: Three times a day (TID) | INTRAMUSCULAR | Status: DC | PRN
  Filled 2012-01-29: qty 2

## 2012-01-29 MED ORDER — FENTANYL CITRATE 0.05 MG/ML IJ SOLN
INTRAMUSCULAR | Status: DC | PRN
  Administered 2012-01-29: 50 ug via INTRAVENOUS
  Administered 2012-01-29 (×2): 100 ug via INTRAVENOUS

## 2012-01-29 SURGICAL SUPPLY — 65 items
BANDAGE ELASTIC 4 VELCRO ST LF (GAUZE/BANDAGES/DRESSINGS) IMPLANT
BANDAGE ELASTIC 6 VELCRO ST LF (GAUZE/BANDAGES/DRESSINGS) IMPLANT
BANDAGE ESMARK 6X9 LF (GAUZE/BANDAGES/DRESSINGS) IMPLANT
BANDAGE GAUZE ELAST BULKY 4 IN (GAUZE/BANDAGES/DRESSINGS) ×2 IMPLANT
BLADE SAW SAG 73X25 THK (BLADE) ×1
BLADE SAW SGTL 73X25 THK (BLADE) ×1 IMPLANT
BNDG COHESIVE 4X5 TAN STRL (GAUZE/BANDAGES/DRESSINGS) IMPLANT
BNDG ESMARK 6X9 LF (GAUZE/BANDAGES/DRESSINGS)
BRUSH FEMORAL CANAL (MISCELLANEOUS) IMPLANT
CLOTH BEACON ORANGE TIMEOUT ST (SAFETY) ×2 IMPLANT
COVER BACK TABLE 24X17X13 BIG (DRAPES) IMPLANT
COVER SURGICAL LIGHT HANDLE (MISCELLANEOUS) ×2 IMPLANT
CUFF TOURNIQUET SINGLE 34IN LL (TOURNIQUET CUFF) IMPLANT
CUFF TOURNIQUET SINGLE 44IN (TOURNIQUET CUFF) IMPLANT
DRAPE C-ARM 42X72 X-RAY (DRAPES) IMPLANT
DRAPE INCISE IOBAN 66X45 STRL (DRAPES) ×2 IMPLANT
DRAPE INCISE IOBAN 85X60 (DRAPES) ×4 IMPLANT
DRAPE ORTHO SPLIT 77X108 STRL (DRAPES) ×2
DRAPE SURG ORHT 6 SPLT 77X108 (DRAPES) ×2 IMPLANT
DRAPE U-SHAPE 47X51 STRL (DRAPES) ×2 IMPLANT
DRILL BIT 7/64X5 (BIT) ×2 IMPLANT
DRSG EMULSION OIL 3X3 NADH (GAUZE/BANDAGES/DRESSINGS) ×2 IMPLANT
DRSG MEPILEX BORDER 4X12 (GAUZE/BANDAGES/DRESSINGS) ×2 IMPLANT
DRSG PAD ABDOMINAL 8X10 ST (GAUZE/BANDAGES/DRESSINGS) IMPLANT
DURAPREP 26ML APPLICATOR (WOUND CARE) ×2 IMPLANT
ELECT BLADE 6.5 EXT (BLADE) IMPLANT
ELECT CAUTERY BLADE 6.4 (BLADE) IMPLANT
ELECT REM PT RETURN 9FT ADLT (ELECTROSURGICAL) ×2
ELECTRODE REM PT RTRN 9FT ADLT (ELECTROSURGICAL) ×1 IMPLANT
EVACUATOR 1/8 PVC DRAIN (DRAIN) IMPLANT
GLOVE BIOGEL PI IND STRL 9 (GLOVE) ×1 IMPLANT
GLOVE BIOGEL PI INDICATOR 9 (GLOVE) ×1
GLOVE SURG ORTHO 9.0 STRL STRW (GLOVE) ×4 IMPLANT
GOWN PREVENTION PLUS XLARGE (GOWN DISPOSABLE) ×2 IMPLANT
GOWN SRG XL XLNG 56XLVL 4 (GOWN DISPOSABLE) ×2 IMPLANT
GOWN STRL NON-REIN XL XLG LVL4 (GOWN DISPOSABLE) ×2
HANDPIECE INTERPULSE COAX TIP (DISPOSABLE)
KIT BASIN OR (CUSTOM PROCEDURE TRAY) ×2 IMPLANT
KIT ROOM TURNOVER OR (KITS) ×2 IMPLANT
MANIFOLD NEPTUNE II (INSTRUMENTS) ×2 IMPLANT
NS IRRIG 1000ML POUR BTL (IV SOLUTION) ×2 IMPLANT
PACK ORTHO EXTREMITY (CUSTOM PROCEDURE TRAY) ×2 IMPLANT
PACK TOTAL JOINT (CUSTOM PROCEDURE TRAY) ×2 IMPLANT
PAD ARMBOARD 7.5X6 YLW CONV (MISCELLANEOUS) ×4 IMPLANT
PAD CAST 4YDX4 CTTN HI CHSV (CAST SUPPLIES) ×1 IMPLANT
PADDING CAST COTTON 4X4 STRL (CAST SUPPLIES) ×1
SET HNDPC FAN SPRY TIP SCT (DISPOSABLE) IMPLANT
SPONGE GAUZE 4X4 12PLY (GAUZE/BANDAGES/DRESSINGS) ×2 IMPLANT
STAPLER VISISTAT 35W (STAPLE) ×4 IMPLANT
STOCKINETTE IMPERVIOUS 9X36 MD (GAUZE/BANDAGES/DRESSINGS) IMPLANT
SUCTION FRAZIER TIP 10 FR DISP (SUCTIONS) ×2 IMPLANT
SUT ETHIBOND NAB CT1 #1 30IN (SUTURE) ×2 IMPLANT
SUT ETHILON 2 0 PSLX (SUTURE) IMPLANT
SUT VIC AB 0 CT1 27 (SUTURE) ×1
SUT VIC AB 0 CT1 27XBRD ANBCTR (SUTURE) ×1 IMPLANT
SUT VIC AB 1 CT1 27 (SUTURE) ×1
SUT VIC AB 1 CT1 27XBRD ANBCTR (SUTURE) ×1 IMPLANT
SUT VIC AB 2-0 CT1 27 (SUTURE) ×2
SUT VIC AB 2-0 CT1 TAPERPNT 27 (SUTURE) ×2 IMPLANT
SUT VIC AB 2-0 CTB1 (SUTURE) ×2 IMPLANT
TOWEL OR 17X24 6PK STRL BLUE (TOWEL DISPOSABLE) ×2 IMPLANT
TOWEL OR 17X26 10 PK STRL BLUE (TOWEL DISPOSABLE) ×2 IMPLANT
TOWER CARTRIDGE SMART MIX (DISPOSABLE) IMPLANT
TRAY FOLEY CATH 14FR (SET/KITS/TRAYS/PACK) IMPLANT
WATER STERILE IRR 1000ML POUR (IV SOLUTION) ×6 IMPLANT

## 2012-01-29 NOTE — Anesthesia Postprocedure Evaluation (Signed)
  Anesthesia Post-op Note  Patient: [name] [name]  Procedure(s) Performed: Procedure(s) (LRB) with comments: HARDWARE REMOVAL (Right) ARTHROPLASTY BIPOLAR HIP (Right)  Patient Location: PACU  Anesthesia Type:General  Level of Consciousness: awake  Airway and Oxygen Therapy: Patient Spontanous Breathing  Post-op Pain: mild  Post-op Assessment: Post-op Vital signs reviewed  Post-op Vital Signs: Reviewed  Complications: No apparent anesthesia complications

## 2012-01-29 NOTE — ED Provider Notes (Signed)
Medical screening examination/treatment/procedure(s) were conducted as a shared visit with non-physician practitioner(s) and myself.  I personally evaluated the patient during the encounter   [name] [name]. Oletta Lamas, MD 01/29/12 6045

## 2012-01-29 NOTE — H&P (Signed)
[facility] History and Physical  NATALIE CUADROS XLK:[id number] DOB: [birth year] DOA: 01/28/2012  Referring physician: EDP PCP: Pcp Not In System  Specialists:   Chief Complaint: Right hip and Rib Rib Pain after Fall  HPI: [name] [name] is a 71 y.o. female who suffered a fall in her home when she fell as she was trying to sit in a chair and missed the chair.   She fell onto her right side injuring her Right hip and Right chest wall.  She denies having syncope or dizziness associated with her fall.  In the ED she was found to have fractures of the Right Hip, and right ribs X 2.  Orthopedics was consulted by the EDP and Dr. Lajoyce Corners is to see the patient later in the AM.     Review of Systems: The patient denies anorexia, fever, weight loss, vision loss, decreased hearing, hoarseness, chest pain, syncope, dyspnea on exertion, peripheral edema, balance deficits, hemoptysis, abdominal pain, melena, hematochezia, severe indigestion/heartburn, hematuria, incontinence, genital sores, muscle weakness, suspicious skin lesions, transient blindness, difficulty walking, depression, unusual weight change, abnormal bleeding, enlarged lymph nodes, angioedema, and breast masses.    Past Medical History  Diagnosis Date  . Hypertension   . Myocardial infarction   . Shortness of breath   . Asthma   . COPD (chronic obstructive pulmonary disease)     emphysema    . Pneumonia     hx pneumonia ,chronic bronchitis   . H/O blood clots   . Chronic kidney disease     current   UTI   being treated  . GERD (gastroesophageal reflux disease)   . Headache     hx migraines  . Blood dyscrasia     hx blood clotts  . Arthritis    Past Surgical History  Procedure Date  . Right leg   . Hip fracture surgery     RIGHT SIDE  . Shoulder surgery     RIGHT  . Cholecystectomy   . Eye surgery     BIL CATARACT   . Orif wrist fracture 10/13/2011    Procedure: OPEN REDUCTION INTERNAL FIXATION (ORIF) WRIST FRACTURE;   Surgeon: Eldred Manges, MD;  Location: [facility] OR;  Service: Orthopedics;  Laterality: Right;  Open Reduction Internal Fixation Right Distal Radius   . Abdominal hysterectomy   . Btl     Medications:  HOME MEDS: Prior to Admission medications   Medication Sig Start Date End Date Taking? Authorizing Provider  amLODipine (NORVASC) 10 MG tablet Take 10 mg by mouth daily.   Yes Historical Provider, MD  aspirin 81 MG chewable tablet Chew 81 mg by mouth daily.   Yes Historical Provider, MD  docusate sodium (COLACE) 100 MG capsule Take 100 mg by mouth daily as needed. For constipation   Yes Historical Provider, MD  fesoterodine (TOVIAZ) 4 MG TB24 Take 4 mg by mouth at bedtime.   Yes Historical Provider, MD  fish oil-omega-3 fatty acids 1000 MG capsule Take 1 g by mouth daily.   Yes Historical Provider, MD  furosemide (LASIX) 20 MG tablet Take 10 mg by mouth daily as needed. For leg edema   Yes Historical Provider, MD  ibuprofen (ADVIL,MOTRIN) 200 MG tablet Take 600 mg by mouth every 6 (six) hours as needed. For pain   Yes Historical Provider, MD  ipratropium-albuterol (DUONEB) 0.5-2.5 (3) MG/3ML SOLN Take 3 mLs by nebulization every 4 (four) hours as needed. For shortness of breath or wheezing   Yes  Historical Provider, MD  loperamide (IMODIUM) 2 MG capsule Take 2 mg by mouth 4 (four) times daily as needed. For diarrhea.   Yes Historical Provider, MD  meclizine (ANTIVERT) 12.5 MG tablet Take 25 mg by mouth every 4 (four) hours as needed. For dizziness   Yes Historical Provider, MD  metoprolol succinate (TOPROL-XL) 50 MG 24 hr tablet Take 25 mg by mouth daily. Take with or immediately following a meal.   Yes Historical Provider, MD  omeprazole (PRILOSEC) 20 MG capsule Take 20 mg by mouth daily.   Yes Historical Provider, MD  OVER THE COUNTER MEDICATION Take 1-3 tablets by mouth at bedtime as needed. Hyland's Leg Cramps PM for sleep and leg cramps   Yes Historical Provider, MD  pravastatin (PRAVACHOL) 80 MG  tablet Take 80 mg by mouth at bedtime.   Yes Historical Provider, MD  senna-docusate (SENOKOT-S) 8.6-50 MG per tablet Take 2-4 tablets by mouth daily as needed. For constipation   Yes Historical Provider, MD  traMADol (ULTRAM) 50 MG tablet Take 50-100 mg by mouth 4 (four) times daily as needed. For back pain   Yes Historical Provider, MD  vitamin B-12 (CYANOCOBALAMIN) 500 MCG tablet Take 500 mcg by mouth daily.   Yes Historical Provider, MD    No Known Allergies   Social History:  reports that she has been smoking Cigarettes.  She has a 50 pack-year smoking history. She does not have any smokeless tobacco history on file. She reports that she does not drink alcohol or use illicit drugs.   Family History  Problem Relation Age of Onset  . Cancer - Lung Mother   . Coronary artery disease Mother   . Coronary artery disease Sister   . Coronary artery disease Brother     Physical Exam:  GEN:  Pleasant 71 year old Elderly examined  and in no acute distress; cooperative with exam Filed Vitals:   01/28/12 1823 01/28/12 1827 01/28/12 2200  BP:  116/66 107/44  Pulse:  91 85  Temp:  99.5 F (37.5 C)   TempSrc:  Oral   Resp:  19   SpO2: 99% 90% 95%   Blood pressure 107/44, pulse 85, temperature 99.5 F (37.5 C), temperature source Oral, resp. rate 19, SpO2 95.00%. PSYCH: He is alert and oriented x4; does not appear anxious does not appear depressed; affect is normal HEENT: Normocephalic and Atraumatic, Mucous membranes pink; PERRLA; EOM intact; Fundi:  Benign;  No scleral icterus, Nares: Patent, Oropharynx: Clear, Edentulous  Neck:  FROM, no cervical lymphadenopathy nor thyromegaly or carotid bruit; no JVD; Breasts:: Not examined CHEST WALL: No tenderness CHEST: Normal respiration, clear to auscultation bilaterally HEART: Regular rate and rhythm; no murmurs rubs or gallops BACK: No kyphosis or scoliosis; no CVA tenderness ABDOMEN: Positive Bowel Sounds, Scaphoid,  soft non-tender; no  masses, no organomegaly. Rectal Exam: Not done EXTREMITIES: No bone or joint deformity; age-appropriate arthropathy of the hands and knees; no cyanosis, clubbing or edema; no ulcerations. Genitalia: not examined PULSES: 2+ and symmetric SKIN: Normal hydration no rash or ulceration CNS: Cranial nerves 2-12 grossly intact no focal neurologic deficit    Labs on Admission:  Basic Metabolic Panel:  Lab 01/28/12 1610  NA 137  K 4.3  CL 100  CO2 29  GLUCOSE 141*  BUN 17  CREATININE 0.65  CALCIUM 9.4  MG --  PHOS --   Liver Function Tests: No results found for this basename: AST:5,ALT:5,ALKPHOS:5,BILITOT:5,PROT:5,ALBUMIN:5 in the last 168 hours No results found for  this basename: LIPASE:5,AMYLASE:5 in the last 168 hours No results found for this basename: AMMONIA:5 in the last 168 hours CBC:  Lab 01/28/12 2111  WBC 9.5  NEUTROABS 8.1*  HGB 14.2  HCT 44.5  MCV 78.2  PLT 85*   Cardiac Enzymes:  Lab 01/28/12 2111  CKTOTAL 27  CKMB --  CKMBINDEX --  TROPONINI --    BNP (last 3 results) No results found for this basename: PROBNP:3 in the last 8760 hours CBG: No results found for this basename: GLUCAP:5 in the last 168 hours  Radiological Exams on Admission: Dg Ribs Unilateral W/chest Right  01/28/2012  *RADIOLOGY REPORT*  Clinical Data: Fall, rib pain, shortness of breath  RIGHT RIBS AND CHEST - 3+ VIEW  Comparison: 10/13/2011  Findings: Chronic interstitial markings/emphysematous changes. Biapical pleural parenchymal scarring. Postsurgical changes/suture lines in the right upper lung.  Cardiomediastinal silhouette is within normal limits.  Suspected right lateral 5th and 6th rib fractures.  IMPRESSION: Suspected right lateral 5th and 6th rib fractures.   Original Report Authenticated By: Charline Bills, M.D.    Dg Hip Complete Right  01/28/2012  *RADIOLOGY REPORT*  Clinical Data: Fall, right hip pain  RIGHT HIP - COMPLETE 2+ VIEW  Comparison: 06/13/2006  Findings:  Subcapital right hip fracture with secondary varus deformity/foreshortening.  Prior ORIF of a proximal femur fracture with associated deformity.  IMPRESSION: Subcapital right hip fracture.   Original Report Authenticated By: Charline Bills, M.D.     EKG: Independently reviewed.   Assessment: Principal Problem:  *Fracture of right hip Active Problems:  Multiple rib fractures  Fall due to stumbling  COPD (chronic obstructive pulmonary disease)  CAD (coronary artery disease)  Hypertension  CKD (chronic kidney disease)  Osteoarthritis   Plan:      Admit to MED/Surg Bed Orthopedics Dr. Lajoyce Corners to see later in AM.   Pain Control PRN Reconcile Home Medications SCDs for DVT prophylaxis Order EKG  Code Status:   FULL CODE Family Communication:    Son at Bedside Disposition Plan:  TBA  Time spent: 68 Minutes  Ron Parker [facility] Pager [phone]  If 7PM-7AM, please contact night-coverage [email or web address] Password [email or web address] 01/29/2012, 1:11 AM

## 2012-01-29 NOTE — Transfer of Care (Signed)
Immediate Anesthesia Transfer of Care Note  Patient: [name] [name]  Procedure(s) Performed: Procedure(s) (LRB) with comments: HARDWARE REMOVAL (Right) ARTHROPLASTY BIPOLAR HIP (Right)  Patient Location: PACU  Anesthesia Type:General  Level of Consciousness: awake and alert   Airway & Oxygen Therapy: Patient Spontanous Breathing and Patient connected to nasal cannula oxygen  Post-op Assessment: Report given to PACU RN and Post -op Vital signs reviewed and stable  Post vital signs: Reviewed and stable  Complications: No apparent anesthesia complications

## 2012-01-29 NOTE — Anesthesia Preprocedure Evaluation (Signed)
Anesthesia Evaluation  Patient identified by MRN, date of birth, ID band Patient awake    Reviewed: Allergy & Precautions, H&P , NPO status   Airway Mallampati: II      Dental   Pulmonary shortness of breath, asthma , pneumonia -, COPD         Cardiovascular hypertension, + CAD and + Past MI     Neuro/Psych  Headaches,    GI/Hepatic Neg liver ROS, GERD-  ,  Endo/Other  negative endocrine ROS  Renal/GU Renal disease     Musculoskeletal   Abdominal   Peds  Hematology negative hematology ROS (+)   Anesthesia Other Findings   Reproductive/Obstetrics                           Anesthesia Physical Anesthesia Plan  ASA: IV  Anesthesia Plan: General   Post-op Pain Management:    Induction:   Airway Management Planned: Oral ETT  Additional Equipment:   Intra-op Plan:   Post-operative Plan:   Informed Consent: I have reviewed the patients History and Physical, chart, labs and discussed the procedure including the risks, benefits and alternatives for the proposed anesthesia with the patient or authorized representative who has indicated his/her understanding and acceptance.     Plan Discussed with: CRNA, Anesthesiologist and Surgeon  Anesthesia Plan Comments:         Anesthesia Quick Evaluation

## 2012-01-29 NOTE — Anesthesia Postprocedure Evaluation (Signed)
  Anesthesia Post-op Note  Patient: [name] [name]  Procedure(s) Performed: Procedure(s) (LRB) with comments: HARDWARE REMOVAL (Right) ARTHROPLASTY BIPOLAR HIP (Right)  Patient Location: PACU  Anesthesia Type:General  Level of Consciousness: awake  Airway and Oxygen Therapy: Patient Spontanous Breathing  Post-op Pain: mild  Post-op Assessment: Post-op Vital signs reviewed  Post-op Vital Signs: Reviewed  Complications: No apparent anesthesia complications 

## 2012-01-29 NOTE — Progress Notes (Signed)
HPI: [name] [name] is a 71 y.o. female who suffered a fall in her home when she fell as she was trying to sit in a chair and missed the chair. She fell onto her right side injuring her Right hip and Right chest wall. She denies having syncope or dizziness associated with her fall. In the ED she was found to have fractures of the Right Hip, and right ribs X 2. Orthopedics was consulted by the EDP and Dr. Lajoyce Corners is to see the patient later in the AM   Blood pressure 107/44, pulse 85, temperature 99.5 F (37.5 C), temperature source Oral, resp. rate 19, SpO2 95.00%.  PSYCH: He is alert and oriented x4; does not appear anxious does not appear depressed; affect is normal  HEENT: Normocephalic and Atraumatic, Mucous membranes pink; PERRLA; EOM intact; Fundi: Benign; No scleral icterus, Nares: Patent, Oropharynx: Clear, Edentulous Neck: FROM, no cervical lymphadenopathy nor thyromegaly or carotid bruit; no JVD;  Breasts:: Not examined  CHEST WALL: No tenderness  CHEST: Normal respiration, clear to auscultation bilaterally  HEART: Regular rate and rhythm; no murmurs rubs or gallops  BACK: No kyphosis or scoliosis; no CVA tenderness  ABDOMEN: Positive Bowel Sounds, Scaphoid, soft non-tender; no masses, no organomegaly.  Rectal Exam: Not done  EXTREMITIES: No bone or joint deformity; age-appropriate arthropathy of the hands and knees; no cyanosis, clubbing or edema; no ulcerations.  Genitalia: not examined  PULSES: 2+ and symmetric  SKIN: Normal hydration no rash or ulceration  CNS: Cranial nerves 2-12 grossly intact no focal neurologic deficit    Patient seen and examined  Fracture of right hip Per orthopedics ,patient needs removal of the intramedullary nail and then right hip hemiarthroplasty.  UTI start rocephin

## 2012-01-29 NOTE — Consult Note (Signed)
Reason for Consult: Right femoral neck fracture. Referring Physician: Taytem Ghattas is an 71 y.o. female.  HPI: Patient is a 71 year old woman who was at home went to sit down missed the chair fell onto her right hip sustaining a right femoral neck fracture.  Past Medical History  Diagnosis Date  . Hypertension   . Myocardial infarction   . Shortness of breath   . Asthma   . COPD (chronic obstructive pulmonary disease)     emphysema    . Pneumonia     hx pneumonia ,chronic bronchitis   . H/O blood clots   . Chronic kidney disease     current   UTI   being treated  . GERD (gastroesophageal reflux disease)   . Headache     hx migraines  . Blood dyscrasia     hx blood clotts  . Arthritis     Past Surgical History  Procedure Date  . Right leg   . Hip fracture surgery     RIGHT SIDE  . Shoulder surgery     RIGHT  . Cholecystectomy   . Eye surgery     BIL CATARACT   . Orif wrist fracture 10/13/2011    Procedure: OPEN REDUCTION INTERNAL FIXATION (ORIF) WRIST FRACTURE;  Surgeon: Eldred Manges, MD;  Location: [facility] OR;  Service: Orthopedics;  Laterality: Right;  Open Reduction Internal Fixation Right Distal Radius   . Abdominal hysterectomy   . Btl     Family History  Problem Relation Age of Onset  . Cancer - Lung Mother   . Coronary artery disease Mother   . Coronary artery disease Sister   . Coronary artery disease Brother     Social History:  reports that she has been smoking Cigarettes.  She has a 50 pack-year smoking history. She does not have any smokeless tobacco history on file. She reports that she does not drink alcohol or use illicit drugs.  Allergies: No Known Allergies  Medications: I have reviewed the patient's current medications.  Results for orders placed during the hospital encounter of 01/28/12 (from the past 48 hour(s))  CBC WITH DIFFERENTIAL     Status: Abnormal   Collection Time   01/28/12  9:11 PM      Component Value Range Comment   WBC  9.5  4.0 - 10.5 K/uL    RBC 5.69 (*) 3.87 - 5.11 MIL/uL    Hemoglobin 14.2  12.0 - 15.0 g/dL    HCT 40.9  81.1 - 91.4 %    MCV 78.2  78.0 - 100.0 fL    MCH 25.0 (*) 26.0 - 34.0 pg    MCHC 31.9  30.0 - 36.0 g/dL    RDW 78.2 (*) 95.6 - 15.5 %    Platelets 85 (*) 150 - 400 K/uL PLATELET COUNT CONFIRMED BY SMEAR   Neutrophils Relative 85 (*) 43 - 77 %    Neutro Abs 8.1 (*) 1.7 - 7.7 K/uL    Lymphocytes Relative 8 (*) 12 - 46 %    Lymphs Abs 0.7  0.7 - 4.0 K/uL    Monocytes Relative 7  3 - 12 %    Monocytes Absolute 0.7  0.1 - 1.0 K/uL    Eosinophils Relative 0  0 - 5 %    Eosinophils Absolute 0.0  0.0 - 0.7 K/uL    Basophils Relative 0  0 - 1 %    Basophils Absolute 0.0  0.0 - 0.1 K/uL  BASIC METABOLIC PANEL     Status: Abnormal   Collection Time   01/28/12  9:11 PM      Component Value Range Comment   Sodium 137  135 - 145 mEq/L    Potassium 4.3  3.5 - 5.1 mEq/L    Chloride 100  96 - 112 mEq/L    CO2 29  19 - 32 mEq/L    Glucose, Bld 141 (*) 70 - 99 mg/dL    BUN 17  6 - 23 mg/dL    Creatinine, Ser 1.61  0.50 - 1.10 mg/dL    Calcium 9.4  8.4 - 09.6 mg/dL    GFR calc non Af Amer 88 (*) >90 mL/min    GFR calc Af Amer >90  >90 mL/min   CK     Status: Normal   Collection Time   01/28/12  9:11 PM      Component Value Range Comment   Total CK 27  7 - 177 U/L   URINALYSIS, DIPSTICK ONLY     Status: Abnormal   Collection Time   01/29/12  4:24 AM      Component Value Range Comment   Specific Gravity, Urine 1.014  1.005 - 1.030    pH 7.5  5.0 - 8.0    Glucose, UA NEGATIVE  NEGATIVE mg/dL    Hgb urine dipstick TRACE (*) NEGATIVE    Bilirubin Urine NEGATIVE  NEGATIVE    Ketones, ur 15 (*) NEGATIVE mg/dL    Protein, ur 30 (*) NEGATIVE mg/dL    Urobilinogen, UA 1.0  0.0 - 1.0 mg/dL    Nitrite NEGATIVE  NEGATIVE    Leukocytes, UA MODERATE (*) NEGATIVE     Dg Ribs Unilateral W/chest Right  01/28/2012  *RADIOLOGY REPORT*  Clinical Data: Fall, rib pain, shortness of breath  RIGHT  RIBS AND CHEST - 3+ VIEW  Comparison: 10/13/2011  Findings: Chronic interstitial markings/emphysematous changes. Biapical pleural parenchymal scarring. Postsurgical changes/suture lines in the right upper lung.  Cardiomediastinal silhouette is within normal limits.  Suspected right lateral 5th and 6th rib fractures.  IMPRESSION: Suspected right lateral 5th and 6th rib fractures.   Original Report Authenticated By: Charline Bills, M.D.    Dg Hip Complete Right  01/28/2012  *RADIOLOGY REPORT*  Clinical Data: Fall, right hip pain  RIGHT HIP - COMPLETE 2+ VIEW  Comparison: 06/13/2006  Findings: Subcapital right hip fracture with secondary varus deformity/foreshortening.  Prior ORIF of a proximal femur fracture with associated deformity.  IMPRESSION: Subcapital right hip fracture.   Original Report Authenticated By: Charline Bills, M.D.     Review of Systems  All other systems reviewed and are negative.   Blood pressure 116/48, pulse 78, temperature 97.8 F (36.6 C), temperature source Oral, resp. rate 20, height 5\' 5"  (1.651 m), weight 52.345 kg (115 lb 6.4 oz), SpO2 96.00%. Physical Exam On examination patient has a shortened externally rotated right lower extremity. She has pain with internal and external rotation of the hip. Radiograph shows an intramedullary nail from previous subtrochanteric femur fracture. Assessment/Plan: Assessment: Right femoral neck fracture with an IM nail of the right femur.  Plan: We will need to obtain more information about the femoral nail we'll obtain AP and lateral radiographs of the right femur. Patient will need removal of the intramedullary nail and then right hip hemiarthroplasty. Risks and benefits were discussed including infection neurovascular injury pain dislocation DVT mortality need for additional surgery. Patient states she understands and wished to proceed at this  time.  We'll plan for surgery afternoon today.  DUDA,MARCUS V 01/29/2012, 5:28 AM

## 2012-01-29 NOTE — ED Notes (Signed)
Per PA [place], provided patient with sandwich and drink

## 2012-01-29 NOTE — Op Note (Signed)
OPERATIVE REPORT  DATE OF SURGERY: 01/29/2012  PATIENT:  [name] [name],  71 y.o. female  PRE-OPERATIVE DIAGNOSIS:  right femoral neck fracture Retained intramedullary nail right femur locked proximally and distally.  POST-OPERATIVE DIAGNOSIS:  same  PROCEDURE:  Procedure(s): HARDWARE REMOVAL intramedullary nail right femur. ARTHROPLASTY BIPOLAR HIP Zimmer components size 47 head size 11 stem, bipolar head.  SURGEON:  Surgeon(s): Nadara Mustard, MD  ANESTHESIA:   general  EBL:  Minimal ML  SPECIMEN:  No Specimen  TOURNIQUET:  * No tourniquets in log *  PROCEDURE DETAILS: Patient is a 71 year old woman with recent falls. She sustained a femur fracture in 2008 July of this year she had a distal radius fracture which underwent open reduction internal fixation and she recently fell at home when she was trying to sit down her chair missed the chair and fell sustaining a right femoral neck fracture. Patient was admitted medically stabilized and presents at this time for surgical intervention. Risks and benefits were discussed including infection neurovascular injury nonhealing of the wounds fracture the bone mortality risk DVT pulmonary embolus. Patient states she understands and wished to proceed at this time. Description of procedure patient brought to the operating room and underwent a general anesthetic. After adequate levels and anesthesia were obtained patient was placed the left leg daily this position with the right side up and the right lower extremity was prepped using DuraPrep and draped into a sterile field Ioban was used to cover all exposed skin. A distal incision was made over the distal interlocking screws the 2 distal interlocking screws were removed. Proximally a posterior lateral incision was made this was carried down through the tensor fascia lata which was split. The proximal interlocking screw the bone completely grown over an osteotome was used to remove the bone and the  screw was removed. The nail was then removed without complications. Using the posterior approach the short external rotators capsule piriformis were elevated off the femoral neck and tagged. The femoral neck cut was then made 1 cm proximal to the calcar. The head was delivered this measured a size 47. The canal was sequentially broached to a size 11 stem. Size 11 stem 47 mm bipolar head was then inserted. Patient had full flexion and internal rotation to 90 and the hip was stable. She had full extension. The wound was irrigated throughout the case the capsule short external rotators were closed using Ethibond suture the tensor fascia was closed using #1 Vicryl subcutaneous is closed using 0 Vicryl the skin was closed using staples a Mepilex dressing was applied patient was extubated taken to the PACU in stable condition.  PLAN OF CARE: Admit to inpatient   PATIENT DISPOSITION:  PACU - hemodynamically stable.   Nadara Mustard, MD 01/29/2012 3:40 PM

## 2012-01-30 LAB — CBC WITH DIFFERENTIAL/PLATELET
Basophils Absolute: 0 10*3/uL (ref 0.0–0.1)
Eosinophils Absolute: 0 10*3/uL (ref 0.0–0.7)
Eosinophils Relative: 0 % (ref 0–5)
Lymphocytes Relative: 18 % (ref 12–46)
MCV: 79 fL (ref 78.0–100.0)
Neutrophils Relative %: 64 % (ref 43–77)
Platelets: 45 10*3/uL — ABNORMAL LOW (ref 150–400)
RDW: 16.4 % — ABNORMAL HIGH (ref 11.5–15.5)
WBC: 8 10*3/uL (ref 4.0–10.5)

## 2012-01-30 LAB — BASIC METABOLIC PANEL
CO2: 26 mEq/L (ref 19–32)
Calcium: 7.7 mg/dL — ABNORMAL LOW (ref 8.4–10.5)
Creatinine, Ser: 0.68 mg/dL (ref 0.50–1.10)
GFR calc non Af Amer: 87 mL/min — ABNORMAL LOW (ref >90)

## 2012-01-30 LAB — CBC
Hemoglobin: 8.1 g/dL — ABNORMAL LOW (ref 12.0–15.0)
MCH: 24.1 pg — ABNORMAL LOW (ref 26.0–34.0)
MCV: 78.3 fL (ref 78.0–100.0)
RBC: 3.36 MIL/uL — ABNORMAL LOW (ref 3.87–5.11)

## 2012-01-30 MED ORDER — CEFTRIAXONE SODIUM 1 G IJ SOLR
1.0000 g | INTRAMUSCULAR | Status: DC
  Administered 2012-01-30 – 2012-02-01 (×3): 1 g via INTRAVENOUS
  Filled 2012-01-30 (×5): qty 10

## 2012-01-30 MED ORDER — FONDAPARINUX SODIUM 2.5 MG/0.5ML ~~LOC~~ SOLN
2.5000 mg | Freq: Every day | SUBCUTANEOUS | Status: DC
  Administered 2012-01-30 – 2012-02-02 (×4): 2.5 mg via SUBCUTANEOUS
  Filled 2012-01-30 (×4): qty 0.5

## 2012-01-30 MED ORDER — METOPROLOL TARTRATE 12.5 MG HALF TABLET
12.5000 mg | ORAL_TABLET | Freq: Every morning | ORAL | Status: DC
  Administered 2012-01-30: 12.5 mg via ORAL
  Filled 2012-01-30 (×2): qty 1

## 2012-01-30 NOTE — Evaluation (Signed)
Physical Therapy Evaluation Patient Details Name: [name] [name] MRN: [id number] DOB: Nov 16, 1940 Today's Date: 01/30/2012 Time: 4098-1191 PT Time Calculation (min): 24 min  PT Assessment / Plan / Recommendation Clinical Impression  Pt is 71 y/o female admitted for fall and sustained right hip fx with s/p right IM nail and bipolar right THA.  Pt will benefit from acute PT services to improve overall mobility and prepare for safe d/c to next venue.    PT Assessment  Patient needs continued PT services    Follow Up Recommendations  SNF    Does the patient have the potential to tolerate intense rehabilitation      Barriers to Discharge Decreased caregiver support      Equipment Recommendations  None recommended by PT    Recommendations for Other Services     Frequency Min 5X/week    Precautions / Restrictions Precautions Precautions: Posterior Hip;Fall Precaution Booklet Issued: Yes (comment) Precaution Comments: Educated on 3/3 posterior hip precautions Restrictions Weight Bearing Restrictions: Yes RLE Weight Bearing: Weight bearing as tolerated   Pertinent Vitals/Pain 5/10 right hip pain      Mobility  Bed Mobility Bed Mobility: Supine to Sit Supine to Sit: 1: +2 Total assist;HOB flat;With rails Supine to Sit: Patient Percentage: 40% Details for Bed Mobility Assistance: +2 (A) with to elevates trunk OOB and assist with LE.  Cues for proper technique and to prevent right LE internal rotation. Transfers Transfers: Sit to Stand;Stand to Sit;Stand Pivot Transfers Sit to Stand: 1: +2 Total assist;From elevated surface;From bed Sit to Stand: Patient Percentage: 60% Stand to Sit: 1: +2 Total assist;To chair/3-in-1 Stand to Sit: Patient Percentage: 60% Stand Pivot Transfers: 1: +2 Total assist Stand Pivot Transfers: Patient Percentage: 50% Details for Transfer Assistance: +2 (A) to initiate transfer with cues for hand placement and proper LE placement.  Pt having  difficulty weightbearing through right LE. Ambulation/Gait Ambulation/Gait Assistance: Not tested (comment)    Shoulder Instructions     Exercises Total Joint Exercises Ankle Circles/Pumps: AAROM;Right;10 reps   PT Diagnosis: Difficulty walking;Abnormality of gait;Generalized weakness;Acute pain  PT Problem List: Decreased strength;Decreased range of motion;Decreased activity tolerance;Decreased balance;Pain;Decreased mobility;Decreased knowledge of use of DME PT Treatment Interventions: DME instruction;Gait training;Functional mobility training;Therapeutic activities;Therapeutic exercise;Balance training;Neuromuscular re-education;Patient/family education   PT Goals Acute Rehab PT Goals PT Goal Formulation: With patient Time For Goal Achievement: 02/06/12 Potential to Achieve Goals: Good Pt will go Supine/Side to Sit: with supervision PT Goal: Supine/Side to Sit - Progress: Goal set today Pt will go Sit to Supine/Side: with supervision PT Goal: Sit to Supine/Side - Progress: Goal set today Pt will go Sit to Stand: with supervision PT Goal: Sit to Stand - Progress: Goal set today Pt will go Stand to Sit: with supervision PT Goal: Stand to Sit - Progress: Goal set today Pt will Transfer Bed to Chair/Chair to Bed: with supervision PT Transfer Goal: Bed to Chair/Chair to Bed - Progress: Goal set today Pt will Ambulate: 16 - 50 feet;with supervision;with rolling walker PT Goal: Ambulate - Progress: Goal set today  Visit Information  Last PT Received On: 01/30/12 Assistance Needed: +2    Subjective Data  Subjective: "I can't hear to well." Patient Stated Goal: To eventually live with son   Prior Functioning  Home Living Lives With: Alone Available Help at Discharge:  (pt plans to go to sons home however son works during the day) Type of Home: House Home Access: Level entry Home Layout: One level Bathroom Shower/Tub: Tub/shower unit  Bathroom Toilet: Standard Prior  Function Level of Independence: Independent with assistive device(s);Needs assistance (Ambulates with RW) Driving: No Vocation: Retired Comments: Pt takes sponge baths alone however daughter in law helps with the bath tube. Communication Communication: HOH    Cognition  Overall Cognitive Status: Appears within functional limits for tasks assessed/performed Arousal/Alertness: Awake/alert Orientation Level: Appears intact for tasks assessed Behavior During Session: [facility] for tasks performed    Extremity/Trunk Assessment Right Lower Extremity Assessment RLE ROM/Strength/Tone: Unable to fully assess;Due to pain;Due to precautions Left Lower Extremity Assessment LLE ROM/Strength/Tone: Within functional levels   Balance    End of Session PT - End of Session Equipment Utilized During Treatment: Gait belt Activity Tolerance: Patient tolerated treatment well Patient left: in chair;with call bell/phone within reach Nurse Communication: Mobility status;Precautions;Weight bearing status  GP     Sincerity Cedar 01/30/2012, 9:34 AM Jake Shark, PT DPT [phone]

## 2012-01-30 NOTE — Progress Notes (Signed)
Saw and assessed patient after receiving report from outgoing RN at approximately 0740. Patient's AOX4. Assisted with bed mobility and ADLs. Voiced no c/o of discomfort. V/S are within acceptable limits. All due meds administered as ordered by MD. Will continue to monitor.

## 2012-01-30 NOTE — Progress Notes (Signed)
Patient c/o discomfort to peripheral IV site at [facility]. Site noted with some redness, no bleeding.Catheter removed.

## 2012-01-30 NOTE — Clinical Social Work Note (Signed)
[name] consulted for SNF placement. Unable to reach pt's family members via phone and no family in room. Met briefly with pt who is confused at time of visit. FL2 complete and on chart for MD signature as SNF need is anticipated. Weekday [name] to f/u with pt's family re: permission to initiate SNF search.  Dellie Burns, MSW, LCSWA [phone] (Weekends 8:00am-4:30pm)

## 2012-01-30 NOTE — Progress Notes (Signed)
[facility] PROGRESS NOTE  [name] [name] ZHY:[id number] DOB: 11/28/1940 DOA: 01/28/2012 PCP: Pcp Not In System  Assessment/Plan: Principal Problem:  *Fracture of right hip Active Problems:  Multiple rib fractures  Fall due to stumbling  CAD (coronary artery disease)  Hypertension  COPD (chronic obstructive pulmonary disease)  CKD (chronic kidney disease)  Osteoarthritis      right femoral neck fracture  Retained intramedullary nail right femur locked proximally and distally, status post right hip bipolar hemiarthroplasty   Anemia/ thrombocytopenia, will continue to hold heparin products, HIT panel ordered, to use arixtra for DVT prophylaxis Follow CBC closely, transfuse if hemoglobin less than 7  Urinary tract infection continue Rocephin  Hypertension we'll hold norvasc, restart low-dose metoprolol    Code Status: full Family Communication: family updated about patient's clinical progress Disposition Plan:  As above    Brief narrative: [name] [name] is a 71 y.o. female who suffered a fall in her home when she fell as she was trying to sit in a chair and missed the chair. She fell onto her right side injuring her Right hip and Right chest wall. She denies having syncope or dizziness associated with her fall. In the ED she was found to have fractures of the Right Hip, and right ribs X 2. Orthopedics was consulted by the EDP and Dr. Lajoyce Corners is to see the patient later in the AM   Consultants: Nadara Mustard, MD  Procedures:  None  Antibiotics:  None  HPI/Subjective: Denies any cough chest pain or shortness of breath  Objective: Filed Vitals:   01/29/12 2241 01/30/12 0044 01/30/12 0431 01/30/12 0628  BP: 105/51   119/61  Pulse: 99   100  Temp: 98.2 F (36.8 C)   98.5 F (36.9 C)  TempSrc:      Resp: 18   18  Height:      Weight:      SpO2: 99% 100% 100% 99%    Intake/Output Summary (Last 24 hours) at 01/30/12 0847 Last data filed at 01/30/12  0700  Gross per 24 hour  Intake   2530 ml  Output   1250 ml  Net   1280 ml    Exam:  HENT:  Head: Atraumatic.  Nose: Nose normal.  Mouth/Throat: Oropharynx is clear and moist.  Eyes: Conjunctivae are normal. Pupils are equal, round, and reactive to light. No scleral icterus.  Neck: Neck supple. No tracheal deviation present.  Cardiovascular: Normal rate, regular rhythm, normal heart sounds and intact distal pulses.  Pulmonary/Chest: Effort normal and breath sounds normal. No respiratory distress.  Abdominal: Soft. Normal appearance and bowel sounds are normal. She exhibits no distension. There is no tenderness.  Musculoskeletal: She exhibits no edema and no tenderness.  Neurological: She is alert. No cranial nerve deficit.    Data Reviewed: Basic Metabolic Panel:  Lab 01/30/12 2952 01/29/12 0455 01/28/12 2111  NA 134* 138 137  K 4.0 4.1 4.3  CL 102 104 100  CO2 26 28 29   GLUCOSE 116* 116* 141*  BUN 19 17 17   CREATININE 0.68 0.58 0.65  CALCIUM 7.7* 8.6 9.4  MG -- -- --  PHOS -- -- --    Liver Function Tests: No results found for this basename: AST:5,ALT:5,ALKPHOS:5,BILITOT:5,PROT:5,ALBUMIN:5 in the last 168 hours No results found for this basename: LIPASE:5,AMYLASE:5 in the last 168 hours No results found for this basename: AMMONIA:5 in the last 168 hours  CBC:  Lab 01/30/12 0551 01/29/12 0455 01/28/12 2111  WBC 8.0  9.0 9.5  NEUTROABS 5.2 -- 8.1*  HGB 8.4* 13.3 14.2  HCT 27.4* 42.6 44.5  MCV 79.0 78.9 78.2  PLT 45* 70* 85*    Cardiac Enzymes:  Lab 01/28/12 2111  CKTOTAL 27  CKMB --  CKMBINDEX --  TROPONINI --   BNP (last 3 results) No results found for this basename: PROBNP:3 in the last 8760 hours   CBG: No results found for this basename: GLUCAP:5 in the last 168 hours  Recent Results (from the past 240 hour(s))  SURGICAL PCR SCREEN     Status: Normal   Collection Time   01/29/12 12:34 PM      Component Value Range Status Comment   MRSA, PCR  NEGATIVE  NEGATIVE Final    Staphylococcus aureus NEGATIVE  NEGATIVE Final      Studies: Dg Ribs Unilateral W/chest Right  01/28/2012  *RADIOLOGY REPORT*  Clinical Data: Fall, rib pain, shortness of breath  RIGHT RIBS AND CHEST - 3+ VIEW  Comparison: 10/13/2011  Findings: Chronic interstitial markings/emphysematous changes. Biapical pleural parenchymal scarring. Postsurgical changes/suture lines in the right upper lung.  Cardiomediastinal silhouette is within normal limits.  Suspected right lateral 5th and 6th rib fractures.  IMPRESSION: Suspected right lateral 5th and 6th rib fractures.   Original Report Authenticated By: Charline Bills, M.D.    Dg Hip Complete Right  01/28/2012  *RADIOLOGY REPORT*  Clinical Data: Fall, right hip pain  RIGHT HIP - COMPLETE 2+ VIEW  Comparison: 06/13/2006  Findings: Subcapital right hip fracture with secondary varus deformity/foreshortening.  Prior ORIF of a proximal femur fracture with associated deformity.  IMPRESSION: Subcapital right hip fracture.   Original Report Authenticated By: Charline Bills, M.D.    Dg Chest Port 1 View  01/29/2012  *RADIOLOGY REPORT*  Clinical Data: Cough and shortness of breath  PORTABLE CHEST - 1 VIEW  Comparison: 01/28/2012  Findings: Chronic interstitial markings/emphysematous changes. Biapical pleural parenchymal scarring.  Postsurgical changes in the right upper lung.  Developing opacity/aspiration in the left lung apex is possible, but is not convincing on the current study.  No pleural effusion or pneumothorax.  Cardiomediastinal silhouette is within normal limits.  Suspected nondisplaced right lateral rib fracture.  IMPRESSION: Suspected nondisplaced right lateral rib fracture.  Chronic interstitial markings/emphysematous changes with postsurgical changes in the right upper lung.  Developing opacity/aspiration in the left lung apex is possible.   Original Report Authenticated By: Charline Bills, M.D.    Dg Femur Right  Port  01/29/2012  *RADIOLOGY REPORT*  Clinical Data: Right intramedullary nail placement.  Evaluate distal aspect of the nail.  PORTABLE RIGHT FEMUR - 2 VIEW  Comparison: 01/28/2012.  Findings: In conjunction with the prior radiographs, there is an antegrade right femoral nail.  There are two distal interlocking screws.  There is lucency around the inferior screw suggesting some motion.  Atherosclerosis is present in the superficial femoral and popliteal arteries.  There is no fracture of the distal femur. Valgus deformity of the knee appears present.  IMPRESSION: Intramedullary nail with two distal interlocking screws.  Mild loosening of the distal interlocking screw.   Original Report Authenticated By: Andreas Newport, M.D.     Scheduled Meds:   . ipratropium  0.5 mg Nebulization Q4H   And  . albuterol  2.5 mg Nebulization Q4H  . [COMPLETED]  ceFAZolin (ANCEF) IV  1 g Intravenous Q6H  . [COMPLETED]  ceFAZolin (ANCEF) IV  2 g Intravenous 60 min Pre-Op  . nicotine  21 mg Transdermal Daily  . [  DISCONTINUED] chlorhexidine  60 mL Topical Once   Continuous Infusions:   . sodium chloride 1,000 mL (01/29/12 2358)    Principal Problem:  *Fracture of right hip Active Problems:  Multiple rib fractures  Fall due to stumbling  CAD (coronary artery disease)  Hypertension  COPD (chronic obstructive pulmonary disease)  CKD (chronic kidney disease)  Osteoarthritis    Time spent: 40 minutes   [facility]  [facility] Pager [phone]. If 8PM-8AM, please contact night-coverage at [email or web address], password [facility] 01/30/2012, 8:47 AM  LOS: 2 days

## 2012-01-30 NOTE — Progress Notes (Signed)
Patient ID: [name] [name], female   DOB: 1941-03-13, 71 y.o.   MRN: [id number] Postoperative day 1 removal IM nail right hip and right hip bipolar hemiarthroplasty. Patient is comfortable this morning. Hemoglobin has dropped to 8.4. Platelet count is pending. Orders written for short-term skilled nursing.

## 2012-01-31 ENCOUNTER — Encounter (HOSPITAL_COMMUNITY): Payer: Self-pay | Admitting: Orthopedic Surgery

## 2012-01-31 ENCOUNTER — Inpatient Hospital Stay (HOSPITAL_COMMUNITY): Payer: Medicare Other

## 2012-01-31 LAB — URINE CULTURE: Colony Count: >=100000

## 2012-01-31 LAB — DIFFERENTIAL
Basophils Absolute: 0 10*3/uL (ref 0.0–0.1)
Eosinophils Relative: 1 % (ref 0–5)
Lymphocytes Relative: 19 % (ref 12–46)
Monocytes Absolute: 1 10*3/uL (ref 0.1–1.0)

## 2012-01-31 LAB — CBC
HCT: 27.4 % — ABNORMAL LOW (ref 36.0–46.0)
Hemoglobin: 8.6 g/dL — ABNORMAL LOW (ref 12.0–15.0)
RBC: 3.43 MIL/uL — ABNORMAL LOW (ref 3.87–5.11)

## 2012-01-31 LAB — BASIC METABOLIC PANEL
BUN: 13 mg/dL (ref 6–23)
Calcium: 7.9 mg/dL — ABNORMAL LOW (ref 8.4–10.5)
GFR calc non Af Amer: >90 mL/min (ref >90)
Glucose, Bld: 105 mg/dL — ABNORMAL HIGH (ref 70–99)
Potassium: 3.8 mEq/L (ref 3.5–5.1)

## 2012-01-31 LAB — TROPONIN I: Troponin I: <0.3 ng/mL (ref <0.30)

## 2012-01-31 LAB — PREPARE RBC (CROSSMATCH)

## 2012-01-31 MED ORDER — LEVALBUTEROL HCL 0.63 MG/3ML IN NEBU
0.6300 mg | INHALATION_SOLUTION | Freq: Four times a day (QID) | RESPIRATORY_TRACT | Status: DC
  Administered 2012-01-31 – 2012-02-03 (×12): 0.63 mg via RESPIRATORY_TRACT
  Filled 2012-01-31 (×20): qty 3

## 2012-01-31 MED ORDER — DILTIAZEM HCL 25 MG/5ML IV SOLN
10.0000 mg | Freq: Once | INTRAVENOUS | Status: AC
  Administered 2012-01-31: 10 mg via INTRAVENOUS
  Filled 2012-01-31: qty 5

## 2012-01-31 MED ORDER — DIGOXIN 0.25 MG/ML IJ SOLN
0.5000 mg | Freq: Once | INTRAMUSCULAR | Status: AC
  Administered 2012-01-31: 0.5 mg via INTRAVENOUS
  Filled 2012-01-31: qty 2

## 2012-01-31 MED ORDER — IPRATROPIUM BROMIDE 0.02 % IN SOLN
0.5000 mg | Freq: Four times a day (QID) | RESPIRATORY_TRACT | Status: DC
  Administered 2012-01-31 – 2012-02-03 (×12): 0.5 mg via RESPIRATORY_TRACT
  Filled 2012-01-31 (×13): qty 2.5

## 2012-01-31 MED ORDER — METOPROLOL SUCCINATE ER 25 MG PO TB24
25.0000 mg | ORAL_TABLET | Freq: Every day | ORAL | Status: DC
  Administered 2012-01-31 – 2012-02-03 (×4): 25 mg via ORAL
  Filled 2012-01-31 (×4): qty 1

## 2012-01-31 NOTE — Progress Notes (Signed)
Md notified that we are not able to continuously administer IV cardiac medications. Patient currently in NSR with a HR of 94. Will call MD if further intervention is needed. MD made it clear that the patient can be on any telemetry floor and not specifically ortho. We will have to transfer patient if any other cardiac drugs are required.

## 2012-01-31 NOTE — Progress Notes (Addendum)
Shift event:  RN paged NP 2/2 pt being in PAF with rate 90-150, but mostly in the 90-110s. Pt had new onset PAF earlier today and was given Cardizem and Digoxin IV and reverted to NSR. She was receiving blood at that time. Per RN, has been "in and out" of Afib since then, but rate is usually controlled. Pt is non symptomatic-no dizziness, SOB, or chest pain. Pt is on BB. Asked RN to watch for now and call NP if HR is sustained over 120 in Afib. Can increase BB if needed. Will follow.  Maren Reamer, NP [facility] Addendum: 01/31/12 at 0615.  HR has been fine since first incident. Last HR 105. Craige Cotta, NP

## 2012-01-31 NOTE — Progress Notes (Signed)
Physical Therapy Treatment Patient Details Name: [name] [name] MRN: [id number] DOB: 03-10-1941 Today's Date: 01/31/2012 Time: 4098-1191 PT Time Calculation (min): 29 min  PT Assessment / Plan / Recommendation Comments on Treatment Session  Pt admitted s/p fall with right THA now. Pt motivated and progressing well. Able to tolerate ambulation this am.     Follow Up Recommendations  SNF     Does the patient have the potential to tolerate intense rehabilitation     Barriers to Discharge        Equipment Recommendations  None recommended by PT    Recommendations for Other Services    Frequency Min 5X/week   Plan Discharge plan remains appropriate;Frequency remains appropriate    Precautions / Restrictions Precautions Precautions: Posterior Hip;Fall Precaution Booklet Issued: No Precaution Comments: Re-educated on 3/3 posterior hip precautions. Restrictions Weight Bearing Restrictions: Yes RLE Weight Bearing: Weight bearing as tolerated   Pertinent Vitals/Pain 5/10 in right hip. Pt repositioned.    Mobility  Bed Mobility Bed Mobility: Supine to Sit Supine to Sit: 1: +2 Total assist;HOB flat Supine to Sit: Patient Percentage: 50% Details for Bed Mobility Assistance: Assist for right LE due to pain with cues for safest sequence. Assist to translate trunk anterior over BOS as well. Transfers Transfers: Sit to Stand;Stand to Sit (2 trials.) Sit to Stand: 1: +2 Total assist;With upper extremity assist;From bed;From chair/3-in-1 Sit to Stand: Patient Percentage: 60% Stand to Sit: 1: +2 Total assist;With upper extremity assist;To chair/3-in-1 Stand to Sit: Patient Percentage: 60% Details for Transfer Assistance: Assist for trunk to translate anterior over BOS with cues for safest hand/right LE placement. Assist to also slow descent of trunk to chair. Ambulation/Gait Ambulation/Gait Assistance: 1: +2 Total assist Ambulation/Gait: Patient Percentage: 50% Ambulation Distance  (Feet): 20 Feet (10 feet x 2 trials.) Assistive device: Rolling walker Ambulation/Gait Assistance Details: Assist for balance and to off weight right LE due to pain. Cues for safest step-to sequence. Gait Pattern: Step-to pattern;Decreased step length - right;Decreased stance time - right;Trunk flexed;Antalgic Stairs: No Wheelchair Mobility Wheelchair Mobility: No    Exercises Total Joint Exercises Ankle Circles/Pumps: AROM;Right;10 reps;Supine Quad Sets: AROM;Right;10 reps;Supine Short Arc Quad: AAROM;Right;10 reps;Supine Heel Slides: AAROM;Right;10 reps;Supine Hip ABduction/ADduction: AAROM;Right;10 reps;Supine   PT Diagnosis:    PT Problem List:   PT Treatment Interventions:     PT Goals Acute Rehab PT Goals PT Goal Formulation: With patient Time For Goal Achievement: 02/06/12 Potential to Achieve Goals: Good PT Goal: Supine/Side to Sit - Progress: Progressing toward goal PT Goal: Sit to Stand - Progress: Progressing toward goal PT Goal: Stand to Sit - Progress: Progressing toward goal PT Goal: Ambulate - Progress: Progressing toward goal  Visit Information  Last PT Received On: 01/31/12 Assistance Needed: +2    Subjective Data  Subjective: "It just hurts when I am on it." Patient Stated Goal: To eventually live with son   Cognition  Overall Cognitive Status: Appears within functional limits for tasks assessed/performed Arousal/Alertness: Awake/alert Orientation Level: Appears intact for tasks assessed Behavior During Session: [facility] for tasks performed    Balance  Balance Balance Assessed: No  End of Session PT - End of Session Equipment Utilized During Treatment: Gait belt Activity Tolerance: Patient tolerated treatment well Patient left: in chair;with call bell/phone within reach;with nursing in room Nurse Communication: Mobility status   GP     Cephus Shelling 01/31/2012, 8:26 AM  01/31/2012 Cephus Shelling, PT, DPT [phone]

## 2012-01-31 NOTE — Clinical Social Work Psychosocial (Signed)
     Clinical Social Work Department BRIEF PSYCHOSOCIAL ASSESSMENT 01/31/2012  Patient:  [name] [name], [name] [name]     Account Number:  [id number]     Admit date:  01/28/2012  Clinical Social Worker:  Tiburcio Pea  Date/Time:  01/31/2012 04:52 PM  Referred by:  [name]  Date Referred:  01/29/2012 Referred for  SNF Placement   Other Referral:   Interview type:  Family Other interview type:   Daughter in law- [name] [place]   Son of patient is Doristine Counter    PSYCHOSOCIAL DATA Living Status:  ALONE Admitted from facility:   Level of care:   Primary support name:  [name] [name] [facility]  [phone] Primary support relationship to patient:  CHILD, ADULT Degree of support available:   Good support    CURRENT CONCERNS Current Concerns  Post-Acute Placement   Other Concerns:    SOCIAL WORK ASSESSMENT / PLAN Patient was admitted from home; she lives alone and will require at least short term SNF.  Weekend coverage started SNF placmeent- ([facility] and [facility]) but did not send referral as she was unable to reach pt's family. Patient is alert but somewhat confused.  Spoke with pt's daughter in law- [name] Michigan this afternoon who stated that they are requesting short term SNF and hope to eventually get pateint in an assisted living when she is recovered.  They are unsure if she will be able to safely live alone any more. Bed search process discussed and family is requesting placement at [facility].  Daughter has contacted the facility. Referral sent to the SNF and awaiting possible bed offer. Patient is receiving 2 units of blood today- hopefully will be medically stable tomorrow.  [facility] on chart for MD's signature.   Assessment/plan status:  Psychosocial Support/Ongoing Assessment of Needs Other assessment/ plan:   Information/referral to community resources:   Discussed procedure for ALF placement and family encouraged to tour facilities and check on possible vacancies etc.     PATIENTS/FAMILYS RESPONSE TO PLAN OF CARE: Patient is alert but somewhat confused. She is agreeable to short term SNF but hopes to be able to return home. Family supports SNF placement and were appreciative of [name]'s assistance and information provided.

## 2012-01-31 NOTE — Progress Notes (Signed)
[facility] PROGRESS NOTE  [name] [name] ZOX:[id number] DOB: 09-13-1940 DOA: 01/28/2012 PCP: Pcp Not In System  Assessment/Plan: Principal Problem:  *Fracture of right hip Active Problems:  Multiple rib fractures  Fall due to stumbling  CAD (coronary artery disease)  Hypertension  COPD (chronic obstructive pulmonary disease)  CKD (chronic kidney disease)  Osteoarthritis     right femoral neck fracture  Retained intramedullary nail right femur locked proximally and distally, status post right hip bipolar hemiarthroplasty    Anemia/ thrombocytopenia, will continue to hold heparin products, HIT panel ordered, to use arixtra for DVT prophylaxis   Sinus tachycardia rate sustaining in the 200 range, obtain an EKG, cardiac enzymes, likely symptomatic anemia, transfused 2 units of packed red blood cells, rule out DVT/PE   Urinary tract infection continue Rocephin , chest x-ray to rule out pneumonia  Hypertension we'll hold norvasc, change to Toprol-XL 25 mg daily for sinus tachycardia   Code Status: full  Family Communication: family updated about patient's clinical progress  Disposition Plan: Not stable for discharge today given the above issues   Brief narrative:  [name] [name] is a 71 y.o. female who suffered a fall in her home when she fell as she was trying to sit in a chair and missed the chair. She fell onto her right side injuring her Right hip and Right chest wall. She denies having syncope or dizziness associated with her fall. In the ED she was found to have fractures of the Right Hip, and right ribs X 2. Orthopedics was consulted by the EDP and Dr. Lajoyce Corners is to see the patient later in the AM  Consultants:  Nadara Mustard, MD  Procedures:  None Antibiotics:  None HPI/Subjective:  Denies any cough chest pain or shortness of breath, tachycardic  Objective: Filed Vitals:   01/30/12 1421 01/30/12 2154 01/31/12 0045 01/31/12 0659  BP: 127/41 114/50  113/52    Pulse: 112 110 111 110  Temp: 100 F (37.8 C) 98.6 F (37 C)  98.4 F (36.9 C)  TempSrc: Oral     Resp: 18 18 18 18   Height:      Weight:      SpO2: 98% 99% 98% 99%    Intake/Output Summary (Last 24 hours) at 01/31/12 0824 Last data filed at 01/31/12 0700  Gross per 24 hour  Intake   1160 ml  Output    650 ml  Net    510 ml    Exam:  HENT:  Head: Atraumatic.  Nose: Nose normal.  Mouth/Throat: Oropharynx is clear and moist.  Eyes: Conjunctivae are normal. Pupils are equal, round, and reactive to light. No scleral icterus.  Neck: Neck supple. No tracheal deviation present.  Cardiovascular: Normal rate, regular rhythm, normal heart sounds and intact distal pulses.  Pulmonary/Chest: Effort normal and breath sounds normal. No respiratory distress.  Abdominal: Soft. Normal appearance and bowel sounds are normal. She exhibits no distension. There is no tenderness.  Musculoskeletal: She exhibits no edema and no tenderness.  Neurological: She is alert. No cranial nerve deficit.    Data Reviewed: Basic Metabolic Panel:  Lab 01/30/12 8119 01/29/12 0455 01/28/12 2111  NA 134* 138 137  K 4.0 4.1 4.3  CL 102 104 100  CO2 26 28 29   GLUCOSE 116* 116* 141*  BUN 19 17 17   CREATININE 0.68 0.58 0.65  CALCIUM 7.7* 8.6 9.4  MG -- -- --  PHOS -- -- --    Liver Function Tests: No results  found for this basename: AST:5,ALT:5,ALKPHOS:5,BILITOT:5,PROT:5,ALBUMIN:5 in the last 168 hours No results found for this basename: LIPASE:5,AMYLASE:5 in the last 168 hours No results found for this basename: AMMONIA:5 in the last 168 hours  CBC:  Lab 01/30/12 1620 01/30/12 0551 01/29/12 0455 01/28/12 2111  WBC 7.3 8.0 9.0 9.5  NEUTROABS -- 5.2 -- 8.1*  HGB 8.1* 8.4* 13.3 14.2  HCT 26.3* 27.4* 42.6 44.5  MCV 78.3 79.0 78.9 78.2  PLT 45* 45* 70* 85*    Cardiac Enzymes:  Lab 01/28/12 2111  CKTOTAL 27  CKMB --  CKMBINDEX --  TROPONINI --   BNP (last 3 results) No results found for  this basename: PROBNP:3 in the last 8760 hours   CBG: No results found for this basename: GLUCAP:5 in the last 168 hours  Recent Results (from the past 240 hour(s))  URINE CULTURE     Status: Normal (Preliminary result)   Collection Time   01/29/12  9:00 AM      Component Value Range Status Comment   Specimen Description URINE, CATHETERIZED   Final    Special Requests NONE   Final    Culture  Setup Time 01/29/2012 14:56   Final    Colony Count >=100,000 COLONIES/ML   Final    Culture GRAM NEGATIVE RODS   Final    Report Status PENDING   Incomplete   SURGICAL PCR SCREEN     Status: Normal   Collection Time   01/29/12 12:34 PM      Component Value Range Status Comment   MRSA, PCR NEGATIVE  NEGATIVE Final    Staphylococcus aureus NEGATIVE  NEGATIVE Final      Studies: Dg Ribs Unilateral W/chest Right  01/28/2012  *RADIOLOGY REPORT*  Clinical Data: Fall, rib pain, shortness of breath  RIGHT RIBS AND CHEST - 3+ VIEW  Comparison: 10/13/2011  Findings: Chronic interstitial markings/emphysematous changes. Biapical pleural parenchymal scarring. Postsurgical changes/suture lines in the right upper lung.  Cardiomediastinal silhouette is within normal limits.  Suspected right lateral 5th and 6th rib fractures.  IMPRESSION: Suspected right lateral 5th and 6th rib fractures.   Original Report Authenticated By: Charline Bills, M.D.    Dg Hip Complete Right  01/28/2012  *RADIOLOGY REPORT*  Clinical Data: Fall, right hip pain  RIGHT HIP - COMPLETE 2+ VIEW  Comparison: 06/13/2006  Findings: Subcapital right hip fracture with secondary varus deformity/foreshortening.  Prior ORIF of a proximal femur fracture with associated deformity.  IMPRESSION: Subcapital right hip fracture.   Original Report Authenticated By: Charline Bills, M.D.    Dg Chest Port 1 View  01/29/2012  *RADIOLOGY REPORT*  Clinical Data: Cough and shortness of breath  PORTABLE CHEST - 1 VIEW  Comparison: 01/28/2012  Findings:  Chronic interstitial markings/emphysematous changes. Biapical pleural parenchymal scarring.  Postsurgical changes in the right upper lung.  Developing opacity/aspiration in the left lung apex is possible, but is not convincing on the current study.  No pleural effusion or pneumothorax.  Cardiomediastinal silhouette is within normal limits.  Suspected nondisplaced right lateral rib fracture.  IMPRESSION: Suspected nondisplaced right lateral rib fracture.  Chronic interstitial markings/emphysematous changes with postsurgical changes in the right upper lung.  Developing opacity/aspiration in the left lung apex is possible.   Original Report Authenticated By: Charline Bills, M.D.    Dg Femur Right Port  01/29/2012  *RADIOLOGY REPORT*  Clinical Data: Right intramedullary nail placement.  Evaluate distal aspect of the nail.  PORTABLE RIGHT FEMUR - 2 VIEW  Comparison: 01/28/2012.  Findings: In conjunction with the prior radiographs, there is an antegrade right femoral nail.  There are two distal interlocking screws.  There is lucency around the inferior screw suggesting some motion.  Atherosclerosis is present in the superficial femoral and popliteal arteries.  There is no fracture of the distal femur. Valgus deformity of the knee appears present.  IMPRESSION: Intramedullary nail with two distal interlocking screws.  Mild loosening of the distal interlocking screw.   Original Report Authenticated By: Andreas Newport, M.D.     Scheduled Meds:   . cefTRIAXone (ROCEPHIN)  IV  1 g Intravenous Q24H  . fondaparinux (ARIXTRA) injection  2.5 mg Subcutaneous Daily  . ipratropium  0.5 mg Nebulization Q6H  . levalbuterol  0.63 mg Nebulization Q6H  . metoprolol succinate  25 mg Oral Daily  . nicotine  21 mg Transdermal Daily  . [DISCONTINUED] albuterol  2.5 mg Nebulization Q4H  . [DISCONTINUED] ipratropium  0.5 mg Nebulization Q4H  . [DISCONTINUED] metoprolol tartrate  12.5 mg Oral q morning - 10a   Continuous  Infusions:   . sodium chloride 1,000 mL (01/29/12 2358)    Principal Problem:  *Fracture of right hip Active Problems:  Multiple rib fractures  Fall due to stumbling  CAD (coronary artery disease)  Hypertension  COPD (chronic obstructive pulmonary disease)  CKD (chronic kidney disease)  Osteoarthritis    Time spent: 40 minutes   [facility]  [facility] Pager [phone]. If 8PM-8AM, please contact night-coverage at [email or web address], password [facility] 01/31/2012, 8:24 AM  LOS: 3 days

## 2012-01-31 NOTE — Progress Notes (Signed)
Nurse notified my monitor tech that patient heart rate was in the 140's and had gone into a fib. Patient was ambulating at the time. Vitals were stable. MD notified and RN ordered to give Digoxin and Cardizem. Pt is still receiving infusion of blood. Will continue to monitor.

## 2012-01-31 NOTE — Clinical Social Work Placement (Unsigned)
     Clinical Social Work Department CLINICAL SOCIAL WORK PLACEMENT NOTE 02/02/2012  Patient:  [name] [name], [name] [name]  Account Number:  [id number] Admit date:  01/28/2012  Clinical Social Worker:  Lupita Leash CROWDER, LCSWA  Date/time:  01/31/2012 04:48 PM  Clinical Social Work is seeking post-discharge placement for this patient at the following level of care:   SKILLED NURSING   (*[name] will update this form in Epic as items are completed)   01/30/2012  Patient/family provided with [facility] Department of Clinical Social Works list of facilities offering this level of care within the geographic area requested by the patient (or if unable, by the patients family).  01/30/2012  Patient/family informed of their freedom to choose among providers that offer the needed level of care, that participate in Medicare, Medicaid or managed care program needed by the patient, have an available bed and are willing to accept the patient.  01/30/2012  Patient/family informed of [facility] ownership interest in [facility], as well as of the fact that they are under no obligation to receive care at this facility.  [facility] submitted to [facility] on 01/30/2012 [facility] number received from [employer] on 01/30/2012  FL2 transmitted to all facilities in geographic area requested by pt/family on  01/31/2012 FL2 transmitted to all facilities within larger geographic area on 02/02/2012  Patient informed that his/her managed care company has contracts with or will negotiate with  certain facilities, including the following:   NA     Patient/family informed of bed offers received:   Patient chooses bed at  Physician recommends and patient chooses bed at    Patient to be transferred to  on   Patient to be transferred to facility by Ambulance Sharin Mons)  The following physician request were entered in Epic:   Additional Comments:

## 2012-01-31 NOTE — Progress Notes (Signed)
Patient develoved paroxysmal afib Continue toprol,  Give one dose of digoxin and diltiazem

## 2012-01-31 NOTE — Progress Notes (Signed)
Pt has had several runs of sinus tachycardia. Currently is sleeping and has a heart rate of 116. O2 on at 3 L per . sats 95%

## 2012-01-31 NOTE — Progress Notes (Signed)
Subjective: 2 Days Post-Op Procedure(s) (LRB): HARDWARE REMOVAL (Right) ARTHROPLASTY BIPOLAR HIP (Right) Patient reports pain as moderate.    Objective: Vital signs in last 24 hours: Temp:  [98.5 F (36.9 C)-100 F (37.8 C)] 98.6 F (37 C) (11/17 2154) Pulse Rate:  [100-112] 111  (11/18 0045) Resp:  [18] 18  (11/18 0045) BP: (114-127)/(41-61) 114/50 mmHg (11/17 2154) SpO2:  [98 %-99 %] 98 % (11/18 0045)  Intake/Output from previous day: 11/17 0701 - 11/18 0700 In: 1040 [P.O.:1040] Out: 650 [Urine:650] Intake/Output this shift: Total I/O In: 440 [P.O.:440] Out: -    Basename 01/30/12 1620 01/30/12 0551 01/29/12 0455 01/28/12 2111  HGB 8.1* 8.4* 13.3 14.2    Basename 01/30/12 1620 01/30/12 0551  WBC 7.3 8.0  RBC 3.36* 3.47*  HCT 26.3* 27.4*  PLT 45* 45*    Basename 01/30/12 0551 01/29/12 0455  NA 134* 138  K 4.0 4.1  CL 102 104  CO2 26 28  BUN 19 17  CREATININE 0.68 0.58  GLUCOSE 116* 116*  CALCIUM 7.7* 8.6   No results found for this basename: LABPT:2,INR:2 in the last 72 hours  Incision: dressing C/D/I  Assessment/Plan: 2 Days Post-Op Procedure(s) (LRB): HARDWARE REMOVAL (Right) ARTHROPLASTY BIPOLAR HIP (Right) Discharge to SNF  DUDA,MARCUS V 01/31/2012, 6:13 AM

## 2012-02-01 ENCOUNTER — Inpatient Hospital Stay (HOSPITAL_COMMUNITY): Payer: Medicare Other

## 2012-02-01 DIAGNOSIS — M79609 Pain in unspecified limb: Secondary | ICD-10-CM

## 2012-02-01 LAB — TYPE AND SCREEN: Unit division: 0

## 2012-02-01 LAB — CBC WITH DIFFERENTIAL/PLATELET
Basophils Relative: 0 % (ref 0–1)
Hemoglobin: 10.5 g/dL — ABNORMAL LOW (ref 12.0–15.0)
Lymphocytes Relative: 21 % (ref 12–46)
Lymphs Abs: 1.4 10*3/uL (ref 0.7–4.0)
Monocytes Relative: 18 % — ABNORMAL HIGH (ref 3–12)
Neutro Abs: 4 10*3/uL (ref 1.7–7.7)
Neutrophils Relative %: 61 % (ref 43–77)
RBC: 3.95 MIL/uL (ref 3.87–5.11)
WBC: 6.5 10*3/uL (ref 4.0–10.5)

## 2012-02-01 LAB — PROTIME-INR: INR: 1.02 (ref 0.00–1.49)

## 2012-02-01 MED ORDER — WARFARIN - PHARMACIST DOSING INPATIENT: Freq: Every day | Status: DC

## 2012-02-01 MED ORDER — DILTIAZEM HCL ER COATED BEADS 240 MG PO CP24
240.0000 mg | ORAL_CAPSULE | Freq: Once | ORAL | Status: AC
  Administered 2012-02-01: 240 mg via ORAL
  Filled 2012-02-01: qty 1

## 2012-02-01 MED ORDER — WARFARIN VIDEO: Freq: Once | Status: DC

## 2012-02-01 MED ORDER — COUMADIN BOOK
Freq: Once | Status: DC
  Filled 2012-02-01: qty 1

## 2012-02-01 MED ORDER — DILTIAZEM HCL 90 MG PO TABS
240.0000 mg | ORAL_TABLET | Freq: Once | ORAL | Status: DC
  Filled 2012-02-01: qty 1

## 2012-02-01 MED ORDER — DIGOXIN 250 MCG PO TABS
0.2500 mg | ORAL_TABLET | Freq: Every day | ORAL | Status: DC
  Administered 2012-02-01 – 2012-02-03 (×3): 0.25 mg via ORAL
  Filled 2012-02-01 (×3): qty 1

## 2012-02-01 MED ORDER — WARFARIN SODIUM 4 MG PO TABS
4.0000 mg | ORAL_TABLET | Freq: Once | ORAL | Status: AC
  Administered 2012-02-01: 4 mg via ORAL
  Filled 2012-02-01 (×2): qty 1

## 2012-02-01 MED ORDER — IOHEXOL 350 MG/ML SOLN
80.0000 mL | Freq: Once | INTRAVENOUS | Status: AC | PRN
  Administered 2012-02-01: 80 mL via INTRAVENOUS

## 2012-02-01 MED ORDER — DILTIAZEM HCL 100 MG IV SOLR
10.0000 mg/h | INTRAVENOUS | Status: DC
  Administered 2012-02-01 – 2012-02-02 (×3): 10 mg/h via INTRAVENOUS
  Filled 2012-02-01 (×3): qty 100

## 2012-02-01 NOTE — Progress Notes (Signed)
Patient is being transferred to 3W-21 for telemetry and cardiac drip. She is first being transported to doppler and then CT before they take her to 3W. Dressing changed on R hip before transfer and vitals stable. Pt was sinus tach rate of 106 before leaving. Tele monitor removed and patient transported to doppler. No family to notify of transfer.

## 2012-02-01 NOTE — Progress Notes (Signed)
Utilization review completed. Arshia Rondon, RN, BSN. 

## 2012-02-01 NOTE — Progress Notes (Signed)
Physical Therapy Treatment Patient Details Name: [name] [name] MRN: [id number] DOB: [birth year] Today's Date: 02/01/2012 Time: 4782-9562 PT Time Calculation (min): 23 min  PT Assessment / Plan / Recommendation Comments on Treatment Session  Pt admitted s/p fall with right THA. Pt progressing well and limited this am by HR into 180s afib with mobility. Able to tolerate ambulation with increased independence, however, as well as therapeutic exercises.    Follow Up Recommendations  SNF     Does the patient have the potential to tolerate intense rehabilitation     Barriers to Discharge        Equipment Recommendations  None recommended by PT    Recommendations for Other Services    Frequency Min 5X/week   Plan Discharge plan remains appropriate;Frequency remains appropriate    Precautions / Restrictions Precautions Precautions: Posterior Hip;Fall Precaution Booklet Issued: No Precaution Comments: Re-educated on 3/3 posterior hip precautions. Restrictions Weight Bearing Restrictions: Yes RLE Weight Bearing: Weight bearing as tolerated   Pertinent Vitals/Pain 2/10 in right hip. Pt repositioned.    Mobility  Bed Mobility Bed Mobility: Not assessed Transfers Transfers: Sit to Stand;Stand to Sit (2 trials.) Sit to Stand: 4: Min assist;With upper extremity assist;From chair/3-in-1 Stand to Sit: 4: Min assist;With upper extremity assist;To chair/3-in-1 Details for Transfer Assistance: Assist for balance and to off weight right LE due to pain. Cues for safest hand/right LE placement. Ambulation/Gait Ambulation/Gait Assistance: 1: +2 Total assist Ambulation/Gait: Patient Percentage: 70% Ambulation Distance (Feet): 20 Feet (10 feet x 2 trials) Assistive device: Rolling walker Ambulation/Gait Assistance Details: Assist for balance, to off weight right LE due to pain, and direct RW. Cues for safe sequence. Distance limited by HR into 180s afib with ambulation. Gait Pattern: Step-to  pattern;Decreased step length - right;Decreased stance time - right;Trunk flexed;Antalgic Stairs: No Wheelchair Mobility Wheelchair Mobility: No    Exercises Total Joint Exercises Ankle Circles/Pumps: AROM;Right;10 reps;Supine Quad Sets: AROM;Right;10 reps;Supine Short Arc Quad: AAROM;Right;10 reps;Supine Heel Slides: AAROM;Right;10 reps;Supine Hip ABduction/ADduction: AAROM;Right;10 reps;Supine   PT Diagnosis:    PT Problem List:   PT Treatment Interventions:     PT Goals Acute Rehab PT Goals PT Goal Formulation: With patient Time For Goal Achievement: 02/06/12 Potential to Achieve Goals: Good PT Goal: Sit to Stand - Progress: Progressing toward goal PT Goal: Stand to Sit - Progress: Progressing toward goal PT Goal: Ambulate - Progress: Progressing toward goal  Visit Information  Last PT Received On: 02/01/12 Assistance Needed: +2 (Safety)    Subjective Data  Subjective: "I want to try my hardest." Patient Stated Goal: To eventually live with son   Cognition  Overall Cognitive Status: Appears within functional limits for tasks assessed/performed Arousal/Alertness: Awake/alert Orientation Level: Appears intact for tasks assessed Behavior During Session: [facility] for tasks performed    Balance  Balance Balance Assessed: No  End of Session PT - End of Session Equipment Utilized During Treatment: Gait belt Activity Tolerance: Patient tolerated treatment well Patient left: in chair;with call bell/phone within reach Nurse Communication: Mobility status   GP     Cephus Shelling 02/01/2012, 10:35 AM  02/01/2012 Cephus Shelling, PT, DPT [phone]

## 2012-02-01 NOTE — Progress Notes (Signed)
[facility] PROGRESS NOTE  JOCLYNN LUMB YQM:[id number] DOB: [birth year] DOA: 01/28/2012 PCP: Pcp Not In System  Assessment/Plan: Principal Problem:  *Fracture of right hip Active Problems:  Multiple rib fractures  Fall due to stumbling  CAD (coronary artery disease)  Hypertension  COPD (chronic obstructive pulmonary disease)  CKD (chronic kidney disease)  Osteoarthritis   Paroxysmal atrial fibrillation, will obtain a 2-D echo, TSH, likely in the setting of pain and anemia, started on digoxin diltiazem and Toprol, cardiology consulted  right femoral neck fracture  Retained intramedullary nail right femur locked proximally and distally, status post right hip bipolar hemiarthroplasty awaiting SNF  Anemia/ thrombocytopenia, will continue to hold heparin products, HIT panel ordered, to use arixtra for DVT prophylaxis  Will eventually need an outpatient hematology consultation  Sinus tachycardia rate sustaining in the 200 range, obtain an EKG, cardiac enzymes, likely symptomatic anemia, transfused 2 units of packed red blood cells, rule out DVT/PE , obtain a Doppler and a CT angiogram  Urinary tract infection continue Rocephin , chest x-ray to rule out pneumonia   Hypertension we'll hold norvasc, change to Toprol-XL 25 mg daily for sinus tachycardia    Code Status: full  Family Communication: family updated about patient's clinical progress  Disposition Plan: Not stable for discharge today given the above issues    Brief narrative:  [name] [name] is a 71 y.o. female who suffered a fall in her home when she fell as she was trying to sit in a chair and missed the chair. She fell onto her right side injuring her Right hip and Right chest wall. She denies having syncope or dizziness associated with her fall. In the ED she was found to have fractures of the Right Hip, and right ribs X 2. Orthopedics was consulted by the EDP and Dr. Lajoyce Corners is to see the patient later in the AM    Consultants:  Nadara Mustard, MD  Procedures:  None Antibiotics:  None HPI/Subjective:  Denies any cough chest pain or shortness of breath, tachycardic   Objective: Filed Vitals:   01/31/12 2056 01/31/12 2343 02/01/12 0151 02/01/12 0658  BP:  115/47  120/48  Pulse: 115 105  100  Temp:  97.6 F (36.4 C)  97.4 F (36.3 C)  TempSrc:      Resp: 18 18 15 18   Height:      Weight:      SpO2:  99%  98%    Intake/Output Summary (Last 24 hours) at 02/01/12 0831 Last data filed at 02/01/12 0659  Gross per 24 hour  Intake  772.5 ml  Output      0 ml  Net  772.5 ml    Exam:  HENT:  Head: Atraumatic.  Nose: Nose normal.  Mouth/Throat: Oropharynx is clear and moist.  Eyes: Conjunctivae are normal. Pupils are equal, round, and reactive to light. No scleral icterus.  Neck: Neck supple. No tracheal deviation present.  Cardiovascular: Normal rate, regular rhythm, normal heart sounds and intact distal pulses.  Pulmonary/Chest: Effort normal and breath sounds normal. No respiratory distress.  Abdominal: Soft. Normal appearance and bowel sounds are normal. She exhibits no distension. There is no tenderness.  Musculoskeletal: She exhibits no edema and no tenderness.  Neurological: She is alert. No cranial nerve deficit.    Data Reviewed: Basic Metabolic Panel:  Lab 01/31/12 5284 01/30/12 0551 01/29/12 0455 01/28/12 2111  NA 137 134* 138 137  K 3.8 4.0 4.1 4.3  CL 103 102 104 100  CO2 27 26 28 29   GLUCOSE 105* 116* 116* 141*  BUN 13 19 17 17   CREATININE 0.46* 0.68 0.58 0.65  CALCIUM 7.9* 7.7* 8.6 9.4  MG -- -- -- --  PHOS -- -- -- --    Liver Function Tests: No results found for this basename: AST:5,ALT:5,ALKPHOS:5,BILITOT:5,PROT:5,ALBUMIN:5 in the last 168 hours No results found for this basename: LIPASE:5,AMYLASE:5 in the last 168 hours No results found for this basename: AMMONIA:5 in the last 168 hours  CBC:  Lab 02/01/12 0615 01/31/12 2230 01/31/12 1352 01/30/12  1620 01/30/12 0551 01/29/12 0455 01/28/12 2111  WBC 6.5 -- 5.7 7.3 8.0 9.0 --  NEUTROABS 4.0 3.8 -- -- 5.2 -- 8.1*  HGB 10.5* -- 8.6* 8.1* 8.4* 13.3 --  HCT 32.1* -- 27.4* 26.3* 27.4* 42.6 --  MCV 81.3 -- 79.9 78.3 79.0 78.9 --  PLT 43* -- 37* 45* 45* 70* --    Cardiac Enzymes:  Lab 01/31/12 2231 01/28/12 2111  CKTOTAL -- 27  CKMB -- --  CKMBINDEX -- --  TROPONINI <0.30 --   BNP (last 3 results) No results found for this basename: PROBNP:3 in the last 8760 hours   CBG: No results found for this basename: GLUCAP:5 in the last 168 hours  Recent Results (from the past 240 hour(s))  URINE CULTURE     Status: Normal   Collection Time   01/29/12  9:00 AM      Component Value Range Status Comment   Specimen Description URINE, CATHETERIZED   Final    Special Requests NONE   Final    Culture  Setup Time 01/29/2012 14:56   Final    Colony Count >=100,000 COLONIES/ML   Final    Culture     Final    Value: ESCHERICHIA COLI     KLEBSIELLA PNEUMONIAE   Report Status 01/31/2012 FINAL   Final    Organism ID, Bacteria ESCHERICHIA COLI   Final    Organism ID, Bacteria KLEBSIELLA PNEUMONIAE   Final   SURGICAL PCR SCREEN     Status: Normal   Collection Time   01/29/12 12:34 PM      Component Value Range Status Comment   MRSA, PCR NEGATIVE  NEGATIVE Final    Staphylococcus aureus NEGATIVE  NEGATIVE Final      Studies: Dg Chest 2 View  01/31/2012  *RADIOLOGY REPORT*  Clinical Data: 71 year old female status post left hip surgery. Shortness of breath.  History of fall with rib fractures.  CHEST - 2 VIEW  Comparison: 01/29/2012 and earlier.  Findings: Stable large lung volumes.  Chronic postoperative changes to the right lung apex with scarring and staple line.  No pneumothorax.  No pleural effusion.  No pulmonary edema.  No acute pulmonary opacity; left upper lobe opacity no longer present. Stable cardiac size and mediastinal contours.  Visualized tracheal air column is within normal  limits.  Osteopenia. Stable visualized osseous structures.  IMPRESSION: Stable hyperinflation and chronic lung changes.  No acute cardiopulmonary abnormality.  Recent right fifth and sixth rib fractures better demonstrated on 01/28/2012.   Original Report Authenticated By: Erskine Speed, M.D.    Dg Ribs Unilateral W/chest Right  01/28/2012  *RADIOLOGY REPORT*  Clinical Data: Fall, rib pain, shortness of breath  RIGHT RIBS AND CHEST - 3+ VIEW  Comparison: 10/13/2011  Findings: Chronic interstitial markings/emphysematous changes. Biapical pleural parenchymal scarring. Postsurgical changes/suture lines in the right upper lung.  Cardiomediastinal silhouette is within normal limits.  Suspected right lateral 5th  and 6th rib fractures.  IMPRESSION: Suspected right lateral 5th and 6th rib fractures.   Original Report Authenticated By: Charline Bills, M.D.    Dg Hip Complete Right  01/28/2012  *RADIOLOGY REPORT*  Clinical Data: Fall, right hip pain  RIGHT HIP - COMPLETE 2+ VIEW  Comparison: 06/13/2006  Findings: Subcapital right hip fracture with secondary varus deformity/foreshortening.  Prior ORIF of a proximal femur fracture with associated deformity.  IMPRESSION: Subcapital right hip fracture.   Original Report Authenticated By: Charline Bills, M.D.    Dg Chest Port 1 View  01/29/2012  *RADIOLOGY REPORT*  Clinical Data: Cough and shortness of breath  PORTABLE CHEST - 1 VIEW  Comparison: 01/28/2012  Findings: Chronic interstitial markings/emphysematous changes. Biapical pleural parenchymal scarring.  Postsurgical changes in the right upper lung.  Developing opacity/aspiration in the left lung apex is possible, but is not convincing on the current study.  No pleural effusion or pneumothorax.  Cardiomediastinal silhouette is within normal limits.  Suspected nondisplaced right lateral rib fracture.  IMPRESSION: Suspected nondisplaced right lateral rib fracture.  Chronic interstitial markings/emphysematous  changes with postsurgical changes in the right upper lung.  Developing opacity/aspiration in the left lung apex is possible.   Original Report Authenticated By: Charline Bills, M.D.    Dg Femur Right Port  01/29/2012  *RADIOLOGY REPORT*  Clinical Data: Right intramedullary nail placement.  Evaluate distal aspect of the nail.  PORTABLE RIGHT FEMUR - 2 VIEW  Comparison: 01/28/2012.  Findings: In conjunction with the prior radiographs, there is an antegrade right femoral nail.  There are two distal interlocking screws.  There is lucency around the inferior screw suggesting some motion.  Atherosclerosis is present in the superficial femoral and popliteal arteries.  There is no fracture of the distal femur. Valgus deformity of the knee appears present.  IMPRESSION: Intramedullary nail with two distal interlocking screws.  Mild loosening of the distal interlocking screw.   Original Report Authenticated By: Andreas Newport, M.D.     Scheduled Meds:   . cefTRIAXone (ROCEPHIN)  IV  1 g Intravenous Q24H  . [COMPLETED] digoxin  0.5 mg Intravenous Once  . digoxin  0.25 mg Oral Daily  . diltiazem  240 mg Oral Once  . [COMPLETED] diltiazem  10 mg Intravenous Once  . fondaparinux (ARIXTRA) injection  2.5 mg Subcutaneous Daily  . ipratropium  0.5 mg Nebulization Q6H  . levalbuterol  0.63 mg Nebulization Q6H  . metoprolol succinate  25 mg Oral Daily  . nicotine  21 mg Transdermal Daily  . [DISCONTINUED] diltiazem  240 mg Oral Once   Continuous Infusions:   . [DISCONTINUED] sodium chloride 1,000 mL (01/29/12 2358)    Principal Problem:  *Fracture of right hip Active Problems:  Multiple rib fractures  Fall due to stumbling  CAD (coronary artery disease)  Hypertension  COPD (chronic obstructive pulmonary disease)  CKD (chronic kidney disease)  Osteoarthritis    Time spent: 40 minutes   [facility]  [facility] Pager [phone]. If 8PM-8AM, please contact night-coverage at  [email or web address], password [facility] 02/01/2012, 8:31 AM  LOS: 4 days

## 2012-02-01 NOTE — Progress Notes (Signed)
ANTICOAGULATION CONSULT NOTE - Follow Up Consult  Pharmacy Consult for coumadin Indication: atrial fibrillation and VTE phrophylaxis  No Known Allergies  Patient Measurements: Height: 5\' 5"  (165.1 cm) Weight: 115 lb 6.4 oz (52.345 kg) IBW/kg (Calculated) : 57    Vital Signs: Temp: 97.4 F (36.3 C) (11/19 0658) BP: 120/48 mmHg (11/19 0658) Pulse Rate: 100  (11/19 0658)  Labs:  Basename 02/01/12 0615 01/31/12 2231 01/31/12 2230 01/31/12 1352 01/30/12 1620 01/30/12 0551  HGB 10.5* -- -- 8.6* -- --  HCT 32.1* -- -- 27.4* 26.3* --  PLT 43* -- -- 37* 45* --  APTT -- -- -- -- -- --  LABPROT -- -- -- -- -- --  INR -- -- -- -- -- --  HEPARINUNFRC -- -- -- -- -- --  CREATININE -- -- 0.46* -- -- 0.68  CKTOTAL -- -- -- -- -- --  CKMB -- -- -- -- -- --  TROPONINI -- <0.30 -- -- -- --    Estimated Creatinine Clearance: 54 ml/min (by C-G formula based on Cr of 0.46).   Medications:  Prescriptions prior to admission  Medication Sig Dispense Refill  . amLODipine (NORVASC) 10 MG tablet Take 10 mg by mouth daily.      Marland Kitchen aspirin 81 MG chewable tablet Chew 81 mg by mouth daily.      Marland Kitchen docusate sodium (COLACE) 100 MG capsule Take 100 mg by mouth daily as needed. For constipation      . fesoterodine (TOVIAZ) 4 MG TB24 Take 4 mg by mouth at bedtime.      . fish oil-omega-3 fatty acids 1000 MG capsule Take 1 g by mouth daily.      . furosemide (LASIX) 20 MG tablet Take 10 mg by mouth daily as needed. For leg edema      . ibuprofen (ADVIL,MOTRIN) 200 MG tablet Take 600 mg by mouth every 6 (six) hours as needed. For pain      . ipratropium-albuterol (DUONEB) 0.5-2.5 (3) MG/3ML SOLN Take 3 mLs by nebulization every 4 (four) hours as needed. For shortness of breath or wheezing      . loperamide (IMODIUM) 2 MG capsule Take 2 mg by mouth 4 (four) times daily as needed. For diarrhea.      . meclizine (ANTIVERT) 12.5 MG tablet Take 25 mg by mouth every 4 (four) hours as needed. For dizziness      .  metoprolol succinate (TOPROL-XL) 50 MG 24 hr tablet Take 25 mg by mouth daily. Take with or immediately following a meal.      . omeprazole (PRILOSEC) 20 MG capsule Take 20 mg by mouth daily.      Marland Kitchen OVER THE COUNTER MEDICATION Take 1-3 tablets by mouth at bedtime as needed. Hyland's Leg Cramps PM for sleep and leg cramps      . pravastatin (PRAVACHOL) 80 MG tablet Take 80 mg by mouth at bedtime.      . senna-docusate (SENOKOT-S) 8.6-50 MG per tablet Take 2-4 tablets by mouth daily as needed. For constipation      . traMADol (ULTRAM) 50 MG tablet Take 50-100 mg by mouth 4 (four) times daily as needed. For back pain      . vitamin B-12 (CYANOCOBALAMIN) 500 MCG tablet Take 500 mcg by mouth daily.        Assessment: Patient is a 71 y.o F s/p fall with hip hemiarthroplasty on 11/16.  Patient is now in afib with plan to start coumadin today in addition  to prophylaxis Arixtra dose 2.5mg  daily (per cardiology).  Platelet is low but appears to be stable at 43.  Will monitor her closely for s/s bleeding.   Goal of Therapy:  INR 2-3 Monitor platelets by anticoagulation protocol: Yes   Plan:  1) coumadin 4mg  PO x1 today  Kaizlee Carlino P 02/01/2012,10:26 AM

## 2012-02-01 NOTE — Progress Notes (Deleted)
Md notified of afib with a HR of 154. Ordered Cardizem 240 mg PO once. Will give as soon as sent up by pharmacy and continue to monitor. No further orders at this time.

## 2012-02-01 NOTE — Progress Notes (Signed)
Per nursing report- patient will be transferred to a telemetry unit for monitoring.  Spoke with Admissions at [facility]. Bed offer in place when medically stable.  Will report off to receiving [name] regarding d/c plan.  Lorri Frederick. [place]  [phone]

## 2012-02-01 NOTE — Progress Notes (Signed)
Bilateral:  No evidence of DVT, superficial thrombosis, or Baker's Cyst.   

## 2012-02-01 NOTE — Progress Notes (Signed)
Md notified of afib with a HR of 154. Patient asymptomatic and denies sob, chest pain, or lightheadedness. BP stable. MD  Ordered Cardizem 240 mg PO once. Will give as soon as sent up by pharmacy and continue to monitor. No further orders at this time.

## 2012-02-01 NOTE — Consult Note (Signed)
CARDIOLOGY CONSULT NOTE  Patient ID: [name] [name] MRN: [id number] DOB/AGE: 1940/08/16 71 y.o.  Admit date: 01/28/2012 Referring Physician  Richarda Overlie, MD Primary Physician:  Pcp Not In System Reason for Consultation  A. FIbrillation.   HPI: Patient is a 71 year old Caucasian female with history of ? Coronary artery disease/MI sometime in 1998 or so  per chart review, history of emphysema, history of VATS in 2003 4 bullous emphysema and pneumothorax, admitted with a fall and right hip and right hip fracture, underwent right hip bipolar hemiarthroplasty on 01/29/2012, had been doing well until yesterday had done up to anemia that needed to transfusion, also developed paroxysmal episodes of atrial fibrillation/atrial flutter. She received IV Cardizem with conversion to sinus rhythm, however this morning she is in persistent atrial placed with rapid response. I've been consulted to evaluate for the same. Patient has chronic chest pain, state that she has occasional heaviness in the middle chest and she usually sits down and it goes well. She states that she gets about one episode every 2-3 months. No other associated symptomatology with this. She denies any palpitations previously or presently, has chronic shortness breath and dyspnea on exertion. There is no history of syncope. There is no history of remote atrial fibrillation. Presently in spite of atrial fibrillation with rapid ventricular response, patient states that she does not feel any palpitations and states that except for right hip pain she is doing well.  Past Medical History  Diagnosis Date  . Hypertension   . Myocardial infarction   . Shortness of breath   . Asthma   . COPD (chronic obstructive pulmonary disease)     emphysema    . Pneumonia     hx pneumonia ,chronic bronchitis   . H/O blood clots   . Chronic kidney disease     current   UTI   being treated  . GERD (gastroesophageal reflux disease)   . Headache     hx  migraines  . Blood dyscrasia     hx blood clotts  . Arthritis      Past Surgical History  Procedure Date  . Right leg   . Hip fracture surgery     RIGHT SIDE  . Shoulder surgery     RIGHT  . Cholecystectomy   . Eye surgery     BIL CATARACT   . Orif wrist fracture 10/13/2011    Procedure: OPEN REDUCTION INTERNAL FIXATION (ORIF) WRIST FRACTURE;  Surgeon: Eldred Manges, MD;  Location: [facility] OR;  Service: Orthopedics;  Laterality: Right;  Open Reduction Internal Fixation Right Distal Radius   . Abdominal hysterectomy   . Btl   . Hardware removal 01/29/2012    Procedure: HARDWARE REMOVAL;  Surgeon: Nadara Mustard, MD;  Location: [facility] OR;  Service: Orthopedics;  Laterality: Right;  . Hip arthroplasty 01/29/2012    Procedure: ARTHROPLASTY BIPOLAR HIP;  Surgeon: Nadara Mustard, MD;  Location: [facility] OR;  Service: Orthopedics;  Laterality: Right;     Family History  Problem Relation Age of Onset  . Cancer - Lung Mother   . Coronary artery disease Mother   . Coronary artery disease Sister   . Coronary artery disease Brother      Social History: History   Social History  . Marital Status: Widowed    Spouse Name: N/A    Number of Children: N/A  . Years of Education: N/A   Occupational History  . Not on file.   Social History Main Topics  .  Smoking status: Current Every Day Smoker -- 1.0 packs/day for 50 years    Types: Cigarettes  . Smokeless tobacco: Not on file  . Alcohol Use: No  . Drug Use: No  . Sexually Active: Not on file   Other Topics Concern  . Not on file   Social History Narrative  . No narrative on file     Prescriptions prior to admission  Medication Sig Dispense Refill  . amLODipine (NORVASC) 10 MG tablet Take 10 mg by mouth daily.      Marland Kitchen aspirin 81 MG chewable tablet Chew 81 mg by mouth daily.      Marland Kitchen docusate sodium (COLACE) 100 MG capsule Take 100 mg by mouth daily as needed. For constipation      . fesoterodine (TOVIAZ) 4 MG TB24 Take 4 mg by mouth at  bedtime.      . fish oil-omega-3 fatty acids 1000 MG capsule Take 1 g by mouth daily.      . furosemide (LASIX) 20 MG tablet Take 10 mg by mouth daily as needed. For leg edema      . ibuprofen (ADVIL,MOTRIN) 200 MG tablet Take 600 mg by mouth every 6 (six) hours as needed. For pain      . ipratropium-albuterol (DUONEB) 0.5-2.5 (3) MG/3ML SOLN Take 3 mLs by nebulization every 4 (four) hours as needed. For shortness of breath or wheezing      . loperamide (IMODIUM) 2 MG capsule Take 2 mg by mouth 4 (four) times daily as needed. For diarrhea.      . meclizine (ANTIVERT) 12.5 MG tablet Take 25 mg by mouth every 4 (four) hours as needed. For dizziness      . metoprolol succinate (TOPROL-XL) 50 MG 24 hr tablet Take 25 mg by mouth daily. Take with or immediately following a meal.      . omeprazole (PRILOSEC) 20 MG capsule Take 20 mg by mouth daily.      Marland Kitchen OVER THE COUNTER MEDICATION Take 1-3 tablets by mouth at bedtime as needed. Hyland's Leg Cramps PM for sleep and leg cramps      . pravastatin (PRAVACHOL) 80 MG tablet Take 80 mg by mouth at bedtime.      . senna-docusate (SENOKOT-S) 8.6-50 MG per tablet Take 2-4 tablets by mouth daily as needed. For constipation      . traMADol (ULTRAM) 50 MG tablet Take 50-100 mg by mouth 4 (four) times daily as needed. For back pain      . vitamin B-12 (CYANOCOBALAMIN) 500 MCG tablet Take 500 mcg by mouth daily.        Scheduled Meds:   . cefTRIAXone (ROCEPHIN)  IV  1 g Intravenous Q24H  . [COMPLETED] digoxin  0.5 mg Intravenous Once  . digoxin  0.25 mg Oral Daily  . [COMPLETED] diltiazem  240 mg Oral Once  . [COMPLETED] diltiazem  10 mg Intravenous Once  . fondaparinux (ARIXTRA) injection  2.5 mg Subcutaneous Daily  . ipratropium  0.5 mg Nebulization Q6H  . levalbuterol  0.63 mg Nebulization Q6H  . metoprolol succinate  25 mg Oral Daily  . nicotine  21 mg Transdermal Daily  . [DISCONTINUED] diltiazem  240 mg Oral Once   Continuous Infusions:   .  diltiazem (CARDIZEM) infusion    . [DISCONTINUED] sodium chloride 1,000 mL (01/29/12 2358)   PRN Meds:.acetaminophen, acetaminophen, HYDROmorphone (DILAUDID) injection, HYDROmorphone (DILAUDID) injection, metoCLOPramide (REGLAN) injection, metoCLOPramide, morphine injection, ondansetron (ZOFRAN) IV, ondansetron (ZOFRAN) IV, ondansetron, ondansetron, oxyCODONE, zolpidem  ROS: Patient denies any bowel or bladder disturbances, complaints of right hip pain, denies any chest pain, patient's direct there is no recent weight change, no fever, syncope, neurologic deficits. Other systems negative.    Physical Exam: Blood pressure 120/48, pulse 100, temperature 97.4 F (36.3 C), temperature source Oral, resp. rate 18, height 5\' 5"  (1.651 m), weight 52.345 kg (115 lb 6.4 oz), SpO2 98.00%.   General appearance: alert, cooperative, appears older than stated age and no distress Neck: no adenopathy, no carotid bruit, no JVD, supple, symmetrical, trachea midline and thyroid not enlarged, symmetric, no tenderness/mass/nodules Lungs: Emphysematous lung fields, decreased breath sounds at the bases, occasional scattered rhonchi heard. Chest wall: no tenderness Abdomen: soft, non-tender; bowel sounds normal; no masses,  no organomegaly Extremities: Homans sign is negative, no sign of DVT, no edema, redness or tenderness in the calves or thighs, no ulcers, gangrene or trophic changes and Postsurgical changes noted on the right hip and right thigh Pulses: 2+ and symmetric Neurologic: Grossly normal  Labs:   Lab Results  Component Value Date   WBC 6.5 02/01/2012   HGB 10.5* 02/01/2012   HCT 32.1* 02/01/2012   MCV 81.3 02/01/2012   PLT 43* 02/01/2012    Lab 01/31/12 2230  NA 137  K 3.8  CL 103  CO2 27  BUN 13  CREATININE 0.46*  CALCIUM 7.9*  PROT --  BILITOT --  ALKPHOS --  ALT --  AST --  GLUCOSE 105*   Lab Results  Component Value Date   CKTOTAL 27 01/28/2012   TROPONINI <0.30 01/31/2012      Lipid Panel  No results found for this basename: chol, trig, hdl, cholhdl, vldl, ldlcalc    EKG: unchanged from previous tracings, sinus tachycardia, LVH. EKG performed on 1118 and 02/01/2012 reveals sinus tachycardia, normal axis, normal intervals, no evidence of ischemia. LVH. Telemetry: Atrial fibrillation with rapid response this morning.    Radiology: CXR 02/01/12:  IMPRESSION: Stable hyperinflation and chronic lung changes.  No acute cardiopulmonary abnormality.  Recent right fifth and sixth rib fractures better demonstrated on 01/28/2012.     ASSESSMENT AND PLAN:  Atrial fibrillation with rapid response. Probable etiology is underlying COPD. I suspect she probably may have paroxysmal episodes of atrial fibrillation, asymptomatic. Anemia may be contributing. 2. Severe COPD and history of VATS in the past due to spontaneous pneumothorax 3.  ? Myocardial infarction sometime in 1998, echocardiogram 2008 for shortness of breath revealing hyperdynamic left ventricle without any valvular abnormality. 4. Hypertension 5. Hyperlipidemia  Recommendation: I have started her back on IV Cardizem for now until her rate is controlled. I would also recommend starting her on Coumadin as she'll be high risk for cerebrovascular cardioembolic phenomena. Patient CHADSDS2-VASc Score 3 risk of stroke of 3.2%.  however warfarin bleeding risk in this patient with recent bleed do to hip fracture and hip surgery would also be very high at 2.5%. However she'll be discharged home on Coumadin due to her recent hip replacement, hence I will start her on Coumadin without any bridging with heparin. I will review her echocardiogram. At this point I do not recommend ischemic workup given her comorbidity. I'll follow the patient along with you. If rate is not controlled, I may consider addition of amiodarone.   Pamella Pert, MD 02/01/2012, 10:18 AM [facility] Pager: [phone] Office:  [phone] If no answer Cell [phone]

## 2012-02-02 DIAGNOSIS — N390 Urinary tract infection, site not specified: Secondary | ICD-10-CM | POA: Diagnosis present

## 2012-02-02 DIAGNOSIS — D696 Thrombocytopenia, unspecified: Secondary | ICD-10-CM

## 2012-02-02 LAB — HEPARIN INDUCED THROMBOCYTOPENIA PNL
Heparin Induced Plt Ab: NEGATIVE
UFH Low Dose 0.1 IU/mL: 0 % Release

## 2012-02-02 LAB — PROTIME-INR
INR: 1.18 (ref 0.00–1.49)
Prothrombin Time: 14.8 seconds (ref 11.6–15.2)

## 2012-02-02 MED ORDER — DILTIAZEM HCL ER COATED BEADS 240 MG PO CP24
240.0000 mg | ORAL_CAPSULE | Freq: Every day | ORAL | Status: DC
  Administered 2012-02-02 – 2012-02-03 (×2): 240 mg via ORAL
  Filled 2012-02-02 (×2): qty 1

## 2012-02-02 MED ORDER — CIPROFLOXACIN HCL 250 MG PO TABS
250.0000 mg | ORAL_TABLET | Freq: Two times a day (BID) | ORAL | Status: DC
  Administered 2012-02-02 – 2012-02-03 (×2): 250 mg via ORAL
  Filled 2012-02-02 (×5): qty 1

## 2012-02-02 MED ORDER — WARFARIN SODIUM 4 MG PO TABS
4.0000 mg | ORAL_TABLET | Freq: Once | ORAL | Status: AC
  Administered 2012-02-02: 4 mg via ORAL
  Filled 2012-02-02: qty 1

## 2012-02-02 NOTE — Progress Notes (Signed)
  Echocardiogram 2D Echocardiogram has been performed.  Cathie Beams 02/02/2012, 4:06 PM

## 2012-02-02 NOTE — Progress Notes (Signed)
Subjective:  Patient converted to sinus rhythm on the telemetry. Last night at 2049 hours, patient had a very brief run of what appears to be atrial tachycardia/atrial flutter which is very brief period Complaints of having frequent stools, bowel accident in bed. Started this morning. Otherwise no chest pain, has chronic shortness of breath, no PND or orthopnea.  Objective:  Vital Signs in the last 24 hours: Temp:  [97.5 F (36.4 C)-98.5 F (36.9 C)] 97.8 F (36.6 C) (11/20 0550) Pulse Rate:  [72-89] 72  (11/20 0550) Resp:  [18-20] 18  (11/20 0550) BP: (98-128)/(58-71) 104/60 mmHg (11/20 0700) SpO2:  [90 %-97 %] 94 % (11/20 0550) Weight:  [55.929 kg (123 lb 4.8 oz)] 55.929 kg (123 lb 4.8 oz) (11/19 1215)   Physical Exam:  General appearance: alert, cooperative, appears older than stated age and no distress  Neck: no adenopathy, no carotid bruit, no JVD, supple, symmetrical, trachea midline and thyroid not enlarged, symmetric, no tenderness/mass/nodules  Cardiac exam: S1, S2 normal no gallop or murmur.  Lungs: Emphysematous lung fields, decreased breath sounds at the bases, occasional scattered rhonchi heard.  Chest wall: no tenderness  Abdomen: soft, non-tender; bowel sounds normal; no masses, no organomegaly  Extremities: Homans sign is negative, no sign of DVT, no edema, redness or tenderness in the calves or thighs, no ulcers, gangrene or trophic changes and Postsurgical changes noted on the right hip and right thigh  Pulses: 2+ and symmetric  Neurologic: Grossly normal   Lab Results:  Basename 02/01/12 0615 01/31/12 1352  WBC 6.5 5.7  HGB 10.5* 8.6*  PLT 43* 37*    Basename 01/31/12 2230  NA 137  K 3.8  CL 103  CO2 27  GLUCOSE 105*  BUN 13  CREATININE 0.46*    Basename 01/31/12 2231  TROPONINI <0.30      EKG: This morning telemetry reveals normal sinus rhythm.  Assessment/Plan:  1. Paroxysmal atrial fibrillation, atrial tachycardia, presently back in sinus  rhythm. Episode lasted less than for hours. 2. Hypertension 3. COPD and continued tobacco use. 4. Hyperlipidemia  Recommendation: I will stop IV Cardizem and switch him to by mouth Cardizem. I believe it to the primary team/orthopedics to see whether patient needs to be on long-term I. to correlation for now. From cardiac standpoint, unless recurrence of atrial fibrillation, no long-term and correlation is indicated at this time, patient was severely anemic postop and she developed atrial tachycardia. However given her multiple cardiovascular risk, recurrence of atrial fibrillation especially in a patient with severe COPD is probably highly likely. I would like to follow her up in the outpatient setting to further cardiac risk stratify her.   Pamella Pert, M.D. 02/02/2012, 8:46 AM [facility] Pager: [phone] Office: [phone] If no answer: [phone]

## 2012-02-02 NOTE — Progress Notes (Signed)
ANTICOAGULATION CONSULT NOTE - Follow Up Consult  Pharmacy Consult for coumadin Indication: atrial fibrillation and VTE prophylaxis  No Known Allergies  Patient Measurements: Height: 5\' 5"  (165.1 cm) Weight: 123 lb 4.8 oz (55.929 kg) IBW/kg (Calculated) : 57    Vital Signs: Temp: 97.8 F (36.6 C) (11/20 0550) BP: 104/60 mmHg (11/20 0700) Pulse Rate: 72  (11/20 0550)  Labs:  Basename 02/02/12 0618 02/01/12 1912 02/01/12 0615 01/31/12 2231 01/31/12 2230 01/31/12 1352 01/30/12 1620  HGB -- -- 10.5* -- -- 8.6* --  HCT -- -- 32.1* -- -- 27.4* 26.3*  PLT -- -- 43* -- -- 37* 45*  APTT -- -- -- -- -- -- --  LABPROT 14.8 13.3 -- -- -- -- --  INR 1.18 1.02 -- -- -- -- --  HEPARINUNFRC -- -- -- -- -- -- --  CREATININE -- -- -- -- 0.46* -- --  CKTOTAL -- -- -- -- -- -- --  CKMB -- -- -- -- -- -- --  TROPONINI -- -- -- <0.30 -- -- --    Estimated Creatinine Clearance: 57.7 ml/min (by C-G formula based on Cr of 0.46).  Assessment: Patient is a 71 y.o F s/p fall with hip hemiarthroplasty on 11/16.  Patient on coumadin for recent afib -started 11/18. Pt also on prophylaxis Arixtra dose 2.5mg  daily (per cardiology) until INR therapeutic.  Platelet is low but appears to be stable at 43 on 11/18.  Will monitor her closely for s/s bleeding. HIT panel pending.   Goal of Therapy:  INR 2-3 Monitor platelets by anticoagulation protocol: Yes   Plan:  1) coumadin 4mg  PO x1 today 2) F/u INR and pending HIT panel  Christoper Fabian, PharmD, BCPS Clinical pharmacist, pager [phone] 02/02/2012,8:28 AM

## 2012-02-02 NOTE — Progress Notes (Signed)
Patient seen and examined. Agree with note by Toya Smothers , NP with the following changes: 1. Will transition her antibiotic for her UTI from Rocephin to Cipro. Will need to complete a total of 7 days of treatment. 2. Has a platelet count of 30s-40s. Will DC fondaparinux as this is a well-known side effect. Had been on lovenox earlier. HIT Panel pending. Platelets were in the 140s in July. 3. Patient has been scheduled for DC SNF in am, however she is now refusing. Have attempted to call son and have left a message for him to call back. This has been discussed with CM, who will attempt to talk to the patient and her son as well. If she continues to refuse SNF, options are [facility] vs home with HH.  Peggye Pitt, MD [facility] Pager: [phone]

## 2012-02-02 NOTE — Progress Notes (Signed)
Physical Therapy Treatment Patient Details Name: [name] [name] MRN: [id number] DOB: 14-Jan-1941 Today's Date: 02/02/2012 Time: 7829-5621 PT Time Calculation (min): 28 min  PT Assessment / Plan / Recommendation Comments on Treatment Session  Pt admitted s/p fall with right THA. Pt progressing well.    Follow Up Recommendations  SNF     Does the patient have the potential to tolerate intense rehabilitation     Barriers to Discharge        Equipment Recommendations  None recommended by PT    Recommendations for Other Services    Frequency Min 5X/week   Plan Discharge plan remains appropriate;Frequency remains appropriate    Precautions / Restrictions Precautions Precautions: Posterior Hip;Fall Precaution Booklet Issued: Yes (comment) (posted precautions over bed (Rm [id number])) Precaution Comments: Re-educated on 3/3 posterior hip precautions. Restrictions RLE Weight Bearing: Weight bearing as tolerated   Pertinent Vitals/Pain "a little sore.." RLE    Mobility  Bed Mobility Bed Mobility: Supine to Sit;Sitting - Scoot to Edge of Bed Supine to Sit: 3: Mod assist;HOB elevated (HOB 20) Sitting - Scoot to Edge of Bed: 4: Min assist;With rail Details for Bed Mobility Assistance: supine scooting to EOB with min assist; pt able to move legs over EOB without assist; assist to raise torso Transfers Transfers: Sit to Stand;Stand to Sit Sit to Stand: 4: Min assist;With upper extremity assist;From bed Stand to Sit: 4: Min assist;With upper extremity assist;With armrests;To chair/3-in-1 Details for Transfer Assistance: assist for hip/knee extension; cues for hand placement Ambulation/Gait Ambulation/Gait Assistance: 4: Min assist Ambulation Distance (Feet): 32 Feet Assistive device: Rolling walker Ambulation/Gait Assistance Details: assist to maneuver RW; cues for larger steps Gait Pattern: Step-to pattern;Decreased stride length;Trunk flexed    Exercises Total Joint Exercises Ankle  Circles/Pumps: AROM;Right;10 reps;Supine Heel Slides: AAROM;Right;10 reps;Supine   PT Diagnosis:    PT Problem List:   PT Treatment Interventions:     PT Goals Acute Rehab PT Goals Pt will go Supine/Side to Sit: with supervision PT Goal: Supine/Side to Sit - Progress: Progressing toward goal Pt will go Sit to Stand: with supervision PT Goal: Sit to Stand - Progress: Progressing toward goal Pt will go Stand to Sit: with supervision PT Goal: Stand to Sit - Progress: Progressing toward goal Pt will Transfer Bed to Chair/Chair to Bed: with supervision PT Transfer Goal: Bed to Chair/Chair to Bed - Progress: Progressing toward goal Pt will Ambulate: 16 - 50 feet;with supervision;with rolling walker PT Goal: Ambulate - Progress: Progressing toward goal  Visit Information  Last PT Received On: 02/02/12 Assistance Needed: +1    Subjective Data  Subjective: "I want to walk out of here" Patient Stated Goal: To eventually live with son   Cognition  Overall Cognitive Status: Appears within functional limits for tasks assessed/performed Arousal/Alertness: Awake/alert Behavior During Session: [facility] for tasks performed    Balance     End of Session PT - End of Session Equipment Utilized During Treatment: Gait belt Activity Tolerance: Patient tolerated treatment well Patient left: in chair;with call bell/phone within reach Nurse Communication: Mobility status;Precautions   GP     Ladarrian Asencio 02/02/2012, 12:22 PM Pager [phone]

## 2012-02-02 NOTE — Progress Notes (Addendum)
Clinical Social Worker spoke with pt's son regarding dc plan.  Son interested in SNFs in [place] or [facility].  [facility] being the first choice-if approved.  Son would like pt to be able to reside in an ALF once she is more independent.  [name] updated MD, RNCM, and left voicemail message with [facility].  [name] to continue to follow and assist as needed.   Angelia Mould, MSW, [place] [phone]

## 2012-02-02 NOTE — Progress Notes (Signed)
[facility] PROGRESS NOTE  [name] [name] EAV:[id number] DOB: 02-19-1941 DOA: 01/28/2012 PCP: Pcp Not In System  Assessment/Plan Paroxysmal atrial fibrillation. Resolved. Appreciate cardiology assistance. Likely related to post-op anemia. 2-D echo pending, TSH 1.2.  Diltiazem transitioned to po today per cards. continue  Dig and Toprol.   right femoral neck fracture :Retained intramedullary nail right femur locked proximally and distally, status post right hip  Hemiarthroplasty on 01/29/12. awaiting SNF . Hopefully tomorrow.  Anemia/ thrombocytopenia, will continue to hold heparin products to use arixtra for DVT prophylaxis. Will eventually need an outpatient hematology consultation   Sinus tachycardia. Resolved. Likely related to  symptomatic anemia, s/p 2 units of packed red blood cells, DVT/PE ruled out.   Urinary tract infection. continue Rocephin day #3. Cultures with ecoli, klebsiella pneumoniae. Chest x-ray with hyperinflation but no acute cardi0-pulmonary process. Pt with urinary incontinence.   Hypertension: fair control. SBP range 98-115. Continue to hold norvasc., continue Toprol-XL 25 mg daily.   Code Status: full  Family Communication:   Disposition Plan: SNF hopefully tomorrow.    Brief narrative:  [name] [name] is a 71 y.o. female who suffered a fall in her home when she fell as she was trying to sit in a chair and missed the chair. She fell onto her right side injuring her Right hip and Right chest wall. She denies having syncope or dizziness associated with her fall. In the ED she was found to have fractures of the Right Hip, and right ribs X 2. Orthopedics was consulted and rt hemiarthoplasty done on 01/29/12. Preparing for discharge to SNF episode aFIB. Cardizem drip started, cardiology consulted and pt transferred to tele bed.    Consultants:  Nadara Mustard, MD  Delrae Rend Cards Procedures:  Rt hip hemiarthroplasty Antibiotics:  Rocephin  01/30/12>>>>  Code Status: full Family Communication:  Disposition Plan:  SNF hopefully tomorrow   Consultants:  See above  Procedures:  See above  Antibiotics:  See above  HPI/Subjective: Sitting up in bed watching TV. VERY hard of hearing. Denies pain/discomfort.  Objective: Filed Vitals:   02/01/12 2043 02/02/12 0412 02/02/12 0550 02/02/12 0700  BP: 115/60  98/60 104/60  Pulse: 81  72   Temp: 97.7 F (36.5 C)  97.8 F (36.6 C)   TempSrc:      Resp: 18  18   Height:      Weight:      SpO2: 96% 90% 94%    No intake or output data in the 24 hours ending 02/02/12 0904 Filed Weights   01/29/12 0117 02/01/12 1215  Weight: 52.345 kg (115 lb 6.4 oz) 55.929 kg (123 lb 4.8 oz)    Exam:   General:  Alert oriented somewhat frail appearing  Cardiovascular: RRR No MGR No LEE  Respiratory: normal effort. Somewhat diminished BS.   Abdomen: flat soft +BS non-tender to palp  Extremities: dressing right hip dry/intack. PPP  Data Reviewed: Basic Metabolic Panel:  Lab 01/31/12 7829 01/30/12 0551 01/29/12 0455 01/28/12 2111  NA 137 134* 138 137  K 3.8 4.0 4.1 4.3  CL 103 102 104 100  CO2 27 26 28 29   GLUCOSE 105* 116* 116* 141*  BUN 13 19 17 17   CREATININE 0.46* 0.68 0.58 0.65  CALCIUM 7.9* 7.7* 8.6 9.4  MG -- -- -- --  PHOS -- -- -- --   Liver Function Tests: No results found for this basename: AST:5,ALT:5,ALKPHOS:5,BILITOT:5,PROT:5,ALBUMIN:5 in the last 168 hours No results found for this basename: LIPASE:5,AMYLASE:5  in the last 168 hours No results found for this basename: AMMONIA:5 in the last 168 hours CBC:  Lab 02/01/12 0615 01/31/12 2230 01/31/12 1352 01/30/12 1620 01/30/12 0551 01/29/12 0455 01/28/12 2111  WBC 6.5 -- 5.7 7.3 8.0 9.0 --  NEUTROABS 4.0 3.8 -- -- 5.2 -- 8.1*  HGB 10.5* -- 8.6* 8.1* 8.4* 13.3 --  HCT 32.1* -- 27.4* 26.3* 27.4* 42.6 --  MCV 81.3 -- 79.9 78.3 79.0 78.9 --  PLT 43* -- 37* 45* 45* 70* --   Cardiac Enzymes:  Lab  01/31/12 2231 01/28/12 2111  CKTOTAL -- 27  CKMB -- --  CKMBINDEX -- --  TROPONINI <0.30 --   BNP (last 3 results) No results found for this basename: PROBNP:3 in the last 8760 hours CBG: No results found for this basename: GLUCAP:5 in the last 168 hours  Recent Results (from the past 240 hour(s))  URINE CULTURE     Status: Normal   Collection Time   01/29/12  9:00 AM      Component Value Range Status Comment   Specimen Description URINE, CATHETERIZED   Final    Special Requests NONE   Final    Culture  Setup Time 01/29/2012 14:56   Final    Colony Count >=100,000 COLONIES/ML   Final    Culture     Final    Value: ESCHERICHIA COLI     KLEBSIELLA PNEUMONIAE   Report Status 01/31/2012 FINAL   Final    Organism ID, Bacteria ESCHERICHIA COLI   Final    Organism ID, Bacteria KLEBSIELLA PNEUMONIAE   Final   SURGICAL PCR SCREEN     Status: Normal   Collection Time   01/29/12 12:34 PM      Component Value Range Status Comment   MRSA, PCR NEGATIVE  NEGATIVE Final    Staphylococcus aureus NEGATIVE  NEGATIVE Final      Studies: Dg Chest 2 View  01/31/2012  *RADIOLOGY REPORT*  Clinical Data: 71 year old female status post left hip surgery. Shortness of breath.  History of fall with rib fractures.  CHEST - 2 VIEW  Comparison: 01/29/2012 and earlier.  Findings: Stable large lung volumes.  Chronic postoperative changes to the right lung apex with scarring and staple line.  No pneumothorax.  No pleural effusion.  No pulmonary edema.  No acute pulmonary opacity; left upper lobe opacity no longer present. Stable cardiac size and mediastinal contours.  Visualized tracheal air column is within normal limits.  Osteopenia. Stable visualized osseous structures.  IMPRESSION: Stable hyperinflation and chronic lung changes.  No acute cardiopulmonary abnormality.  Recent right fifth and sixth rib fractures better demonstrated on 01/28/2012.   Original Report Authenticated By: Erskine Speed, M.D.    Ct  Angio Chest Pe W/cm &/or Wo Cm  02/01/2012  *RADIOLOGY REPORT*  Clinical Data: The patient has chronic chest pain with occasional heaviness in the middle of the chest.  The patient it is episodes in the 2-3 months.  No other associated symptoms but the patient does have chronic shortness of breath.  CT ANGIOGRAPHY CHEST  Technique:  Multidetector CT imaging of the chest using the standard protocol during bolus administration of intravenous contrast. Multiplanar reconstructed images including MIPs were obtained and reviewed to evaluate the vascular anatomy.  Contrast: 80mL OMNIPAQUE IOHEXOL 350 MG/ML SOLN  Comparison: Chest radiograph, 01/31/2012  Findings: There is no evidence of a pulmonary embolism.  The heart is normal in size and configuration.  There are mild to moderate  coronary artery calcifications.  Atherosclerotic plaque is noted along the thoracic aorta.  There is no aortic dilation or dissection.  There is mild prominence of the right and left pulmonary arteries both measuring 23 mm.  No mediastinal or hilar masses or adenopathy.  There is a pulmonary anastomose the staple line at the posterior lateral right apex.  Changes of emphysema are noted bilaterally, fairly advanced and more prominent on the right.  There is chronic appearing bilateral apical lung scarring with other areas of coarse reticular peripheral lung scarring.  No pulmonary masses or nodules are seen.  There are no areas of consolidation.  There are reticular opacities that are dependent in the lower lobes consistent with subsegmental atelectasis.  There are small right and minimal left pleural effusions, but no evidence of pulmonary edema.  The thoracic inlet is unremarkable.  Limited evaluation of the upper abdomen shows mild adrenal gland thickening, likely hyperplasia but is otherwise unremarkable.  There are mild degenerative changes of the thoracic spine.  No osteoblastic or osteolytic lesions.  IMPRESSION: No evidence of a  pulmonary embolism.  Small right and minimal left pleural effusions but no evidence of pulmonary edema.  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  Postsurgical changes at the right apex likely from a bleb resection.  No infiltrate.   Original Report Authenticated By: Amie Portland, M.D.     Scheduled Meds:   . cefTRIAXone (ROCEPHIN)  IV  1 g Intravenous Q24H  . coumadin book   Does not apply Once  . digoxin  0.25 mg Oral Daily  . diltiazem  240 mg Oral Daily  . fondaparinux (ARIXTRA) injection  2.5 mg Subcutaneous Daily  . ipratropium  0.5 mg Nebulization Q6H  . levalbuterol  0.63 mg Nebulization Q6H  . metoprolol succinate  25 mg Oral Daily  . nicotine  21 mg Transdermal Daily  . [COMPLETED] warfarin  4 mg Oral ONCE-1800  . warfarin  4 mg Oral ONCE-1800  . warfarin   Does not apply Once  . Warfarin - Pharmacist Dosing Inpatient   Does not apply q1800   Continuous Infusions:   . [DISCONTINUED] diltiazem (CARDIZEM) infusion 10 mg/hr (02/02/12 4098)    Principal Problem:  *Fracture of right hip Active Problems:  Multiple rib fractures  Fall due to stumbling  CAD (coronary artery disease)  Hypertension  COPD (chronic obstructive pulmonary disease)  CKD (chronic kidney disease)  Osteoarthritis    Time spent: 30 minutes    Gwenyth Bender NP [facility]  If 8PM-8AM, please contact night-coverage at [email or web address], password [facility] 02/02/2012, 9:04 AM  LOS: 5 days      [facility] PROGRESS NOTE  [name] [name] JXB:[id number] DOB: 15-Aug-1940 DOA: 01/28/2012 PCP: Pcp Not In System  Assessment/Plan: 1.   Code Status:  Family Communication:  (indicate person spoken with, relationship, and if by phone, the number) Disposition Plan:    Consultants:    Procedures:    Antibiotics:   (indicate start date, and stop date if known)  HPI/Subjective:   Objective: Filed Vitals:   02/01/12 2043 02/02/12 0412 02/02/12 0550 02/02/12 0700  BP:  115/60  98/60 104/60  Pulse: 81  72   Temp: 97.7 F (36.5 C)  97.8 F (36.6 C)   TempSrc:      Resp: 18  18   Height:      Weight:      SpO2: 96% 90% 94%    No intake or output data in the 24  hours ending 02/02/12 0905 Filed Weights   01/29/12 0117 02/01/12 1215  Weight: 52.345 kg (115 lb 6.4 oz) 55.929 kg (123 lb 4.8 oz)    Exam:   General:    Cardiovascular:   Respiratory:   Abdomen:   Data Reviewed: Basic Metabolic Panel:  Lab 01/31/12 1610 01/30/12 0551 01/29/12 0455 01/28/12 2111  NA 137 134* 138 137  K 3.8 4.0 4.1 4.3  CL 103 102 104 100  CO2 27 26 28 29   GLUCOSE 105* 116* 116* 141*  BUN 13 19 17 17   CREATININE 0.46* 0.68 0.58 0.65  CALCIUM 7.9* 7.7* 8.6 9.4  MG -- -- -- --  PHOS -- -- -- --   Liver Function Tests: No results found for this basename: AST:5,ALT:5,ALKPHOS:5,BILITOT:5,PROT:5,ALBUMIN:5 in the last 168 hours No results found for this basename: LIPASE:5,AMYLASE:5 in the last 168 hours No results found for this basename: AMMONIA:5 in the last 168 hours CBC:  Lab 02/01/12 0615 01/31/12 2230 01/31/12 1352 01/30/12 1620 01/30/12 0551 01/29/12 0455 01/28/12 2111  WBC 6.5 -- 5.7 7.3 8.0 9.0 --  NEUTROABS 4.0 3.8 -- -- 5.2 -- 8.1*  HGB 10.5* -- 8.6* 8.1* 8.4* 13.3 --  HCT 32.1* -- 27.4* 26.3* 27.4* 42.6 --  MCV 81.3 -- 79.9 78.3 79.0 78.9 --  PLT 43* -- 37* 45* 45* 70* --   Cardiac Enzymes:  Lab 01/31/12 2231 01/28/12 2111  CKTOTAL -- 27  CKMB -- --  CKMBINDEX -- --  TROPONINI <0.30 --   BNP (last 3 results) No results found for this basename: PROBNP:3 in the last 8760 hours CBG: No results found for this basename: GLUCAP:5 in the last 168 hours  Recent Results (from the past 240 hour(s))  URINE CULTURE     Status: Normal   Collection Time   01/29/12  9:00 AM      Component Value Range Status Comment   Specimen Description URINE, CATHETERIZED   Final    Special Requests NONE   Final    Culture  Setup Time 01/29/2012 14:56   Final     Colony Count >=100,000 COLONIES/ML   Final    Culture     Final    Value: ESCHERICHIA COLI     KLEBSIELLA PNEUMONIAE   Report Status 01/31/2012 FINAL   Final    Organism ID, Bacteria ESCHERICHIA COLI   Final    Organism ID, Bacteria KLEBSIELLA PNEUMONIAE   Final   SURGICAL PCR SCREEN     Status: Normal   Collection Time   01/29/12 12:34 PM      Component Value Range Status Comment   MRSA, PCR NEGATIVE  NEGATIVE Final    Staphylococcus aureus NEGATIVE  NEGATIVE Final      Studies: Dg Chest 2 View  01/31/2012  *RADIOLOGY REPORT*  Clinical Data: 71 year old female status post left hip surgery. Shortness of breath.  History of fall with rib fractures.  CHEST - 2 VIEW  Comparison: 01/29/2012 and earlier.  Findings: Stable large lung volumes.  Chronic postoperative changes to the right lung apex with scarring and staple line.  No pneumothorax.  No pleural effusion.  No pulmonary edema.  No acute pulmonary opacity; left upper lobe opacity no longer present. Stable cardiac size and mediastinal contours.  Visualized tracheal air column is within normal limits.  Osteopenia. Stable visualized osseous structures.  IMPRESSION: Stable hyperinflation and chronic lung changes.  No acute cardiopulmonary abnormality.  Recent right fifth and sixth rib fractures better demonstrated on 01/28/2012.  Original Report Authenticated By: Erskine Speed, M.D.    Ct Angio Chest Pe W/cm &/or Wo Cm  02/01/2012  *RADIOLOGY REPORT*  Clinical Data: The patient has chronic chest pain with occasional heaviness in the middle of the chest.  The patient it is episodes in the 2-3 months.  No other associated symptoms but the patient does have chronic shortness of breath.  CT ANGIOGRAPHY CHEST  Technique:  Multidetector CT imaging of the chest using the standard protocol during bolus administration of intravenous contrast. Multiplanar reconstructed images including MIPs were obtained and reviewed to evaluate the vascular anatomy.   Contrast: 80mL OMNIPAQUE IOHEXOL 350 MG/ML SOLN  Comparison: Chest radiograph, 01/31/2012  Findings: There is no evidence of a pulmonary embolism.  The heart is normal in size and configuration.  There are mild to moderate coronary artery calcifications.  Atherosclerotic plaque is noted along the thoracic aorta.  There is no aortic dilation or dissection.  There is mild prominence of the right and left pulmonary arteries both measuring 23 mm.  No mediastinal or hilar masses or adenopathy.  There is a pulmonary anastomose the staple line at the posterior lateral right apex.  Changes of emphysema are noted bilaterally, fairly advanced and more prominent on the right.  There is chronic appearing bilateral apical lung scarring with other areas of coarse reticular peripheral lung scarring.  No pulmonary masses or nodules are seen.  There are no areas of consolidation.  There are reticular opacities that are dependent in the lower lobes consistent with subsegmental atelectasis.  There are small right and minimal left pleural effusions, but no evidence of pulmonary edema.  The thoracic inlet is unremarkable.  Limited evaluation of the upper abdomen shows mild adrenal gland thickening, likely hyperplasia but is otherwise unremarkable.  There are mild degenerative changes of the thoracic spine.  No osteoblastic or osteolytic lesions.  IMPRESSION: No evidence of a pulmonary embolism.  Small right and minimal left pleural effusions but no evidence of pulmonary edema.  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  Postsurgical changes at the right apex likely from a bleb resection.  No infiltrate.   Original Report Authenticated By: Amie Portland, M.D.     Scheduled Meds:   . cefTRIAXone (ROCEPHIN)  IV  1 g Intravenous Q24H  . coumadin book   Does not apply Once  . digoxin  0.25 mg Oral Daily  . diltiazem  240 mg Oral Daily  . fondaparinux (ARIXTRA) injection  2.5 mg Subcutaneous Daily  . ipratropium  0.5 mg  Nebulization Q6H  . levalbuterol  0.63 mg Nebulization Q6H  . metoprolol succinate  25 mg Oral Daily  . nicotine  21 mg Transdermal Daily  . [COMPLETED] warfarin  4 mg Oral ONCE-1800  . warfarin  4 mg Oral ONCE-1800  . warfarin   Does not apply Once  . Warfarin - Pharmacist Dosing Inpatient   Does not apply q1800   Continuous Infusions:   . [DISCONTINUED] diltiazem (CARDIZEM) infusion 10 mg/hr (02/02/12 1610)    Principal Problem:  *Fracture of right hip Active Problems:  Multiple rib fractures  Fall due to stumbling  CAD (coronary artery disease)  Hypertension  COPD (chronic obstructive pulmonary disease)  CKD (chronic kidney disease)  Osteoarthritis    Time spent:     [facility] M  [facility]  If 8PM-8AM, please contact night-coverage at [email or web address], password [facility] 02/02/2012, 9:05 AM  LOS: 5 days

## 2012-02-02 NOTE — Progress Notes (Signed)
Patient unavailable. Currently in echo lab. Will follow up in am to complete consult.

## 2012-02-02 NOTE — Consult Note (Signed)
Physical Medicine and Rehabilitation Consult Reason for Consult:  Right hip fracture Referring Physician:  Dr. Ardyth Harps    HPI: [name] [name] is a 71 y.o. female with history of CKD, CAD, severe COPD--O2 dependent who fell while trying to sit in a chair and sustained right hip fracture and right  5th and 6th  rib fractures on 01/29/12. She underwent right hip hemi on the same day by Dr. Lajoyce Corners. Is WBAT and on coumadin for DVT prophylaxis. Post op developed Atrial fibrillation with rapid response. Dr. Jacinto Halim consulted and felt that patient likely with asymptomatic PAF with current anemia contributing to arrythmia. She was transfused with 2 units PRBC.  She was started on IV cardizem and coumadin recommended prevention of cardioembolic phenomena.  BLE doppler negative for DVT. Treated with IV rocephin for E. Coli/Kleb UTI. She developed thrombocytopenia ?due to lovenox or arixtra.--HIT panel pending. Therapies ongoing and patient refusing SNF. MD recommending CIR.    Review of Systems  HENT: Positive for hearing loss.   Eyes: Negative for blurred vision and double vision.  Respiratory: Positive for shortness of breath (with activity.).   Cardiovascular: Negative for chest pain and palpitations.  Gastrointestinal: Positive for heartburn and constipation. Negative for nausea and vomiting.  Genitourinary: Positive for urgency and frequency.       Urge incontinence  Musculoskeletal: Positive for joint pain.       Soreness left heel.  Neurological: Negative for headaches.   Past Medical History  Diagnosis Date  . Hypertension   . Myocardial infarction   . Shortness of breath   . Asthma   . COPD (chronic obstructive pulmonary disease)     emphysema    . Pneumonia     hx pneumonia ,chronic bronchitis   . H/O blood clots   . Chronic kidney disease     current   UTI   being treated  . GERD (gastroesophageal reflux disease)   . Headache     hx migraines  . Blood dyscrasia     hx blood clotts   . Arthritis    Past Surgical History  Procedure Date  . Right leg   . Hip fracture surgery     RIGHT SIDE  . Shoulder surgery     RIGHT  . Cholecystectomy   . Eye surgery     BIL CATARACT   . Orif wrist fracture 10/13/2011    Procedure: OPEN REDUCTION INTERNAL FIXATION (ORIF) WRIST FRACTURE;  Surgeon: Eldred Manges, MD;  Location: [facility] OR;  Service: Orthopedics;  Laterality: Right;  Open Reduction Internal Fixation Right Distal Radius   . Abdominal hysterectomy   . Btl   . Hardware removal 01/29/2012    Procedure: HARDWARE REMOVAL;  Surgeon: Nadara Mustard, MD;  Location: [facility] OR;  Service: Orthopedics;  Laterality: Right;  . Hip arthroplasty 01/29/2012    Procedure: ARTHROPLASTY BIPOLAR HIP;  Surgeon: Nadara Mustard, MD;  Location: [facility] OR;  Service: Orthopedics;  Laterality: Right;   Family History  Problem Relation Age of Onset  . Cancer - Lung Mother   . Coronary artery disease Mother   . Coronary artery disease Sister   . Coronary artery disease Brother    Social History: Lives alone.  Independent with walker PTA. She reports that she has been smoking 2 PPD.  She has a 50 pack-year smoking history. She does not have any smokeless tobacco history on file. She reports that she does not drink alcohol or use illicit drugs.  Allergies:  No Known Allergies  Medications Prior to Admission  Medication Sig Dispense Refill  . amLODipine (NORVASC) 10 MG tablet Take 10 mg by mouth daily.      Marland Kitchen aspirin 81 MG chewable tablet Chew 81 mg by mouth daily.      Marland Kitchen docusate sodium (COLACE) 100 MG capsule Take 100 mg by mouth daily as needed. For constipation      . fesoterodine (TOVIAZ) 4 MG TB24 Take 4 mg by mouth at bedtime.      . fish oil-omega-3 fatty acids 1000 MG capsule Take 1 g by mouth daily.      . furosemide (LASIX) 20 MG tablet Take 10 mg by mouth daily as needed. For leg edema      . ibuprofen (ADVIL,MOTRIN) 200 MG tablet Take 600 mg by mouth every 6 (six) hours as needed. For pain        . ipratropium-albuterol (DUONEB) 0.5-2.5 (3) MG/3ML SOLN Take 3 mLs by nebulization every 4 (four) hours as needed. For shortness of breath or wheezing      . loperamide (IMODIUM) 2 MG capsule Take 2 mg by mouth 4 (four) times daily as needed. For diarrhea.      . meclizine (ANTIVERT) 12.5 MG tablet Take 25 mg by mouth every 4 (four) hours as needed. For dizziness      . metoprolol succinate (TOPROL-XL) 50 MG 24 hr tablet Take 25 mg by mouth daily. Take with or immediately following a meal.      . omeprazole (PRILOSEC) 20 MG capsule Take 20 mg by mouth daily.      Marland Kitchen OVER THE COUNTER MEDICATION Take 1-3 tablets by mouth at bedtime as needed. Hyland's Leg Cramps PM for sleep and leg cramps      . pravastatin (PRAVACHOL) 80 MG tablet Take 80 mg by mouth at bedtime.      . senna-docusate (SENOKOT-S) 8.6-50 MG per tablet Take 2-4 tablets by mouth daily as needed. For constipation      . traMADol (ULTRAM) 50 MG tablet Take 50-100 mg by mouth 4 (four) times daily as needed. For back pain      . vitamin B-12 (CYANOCOBALAMIN) 500 MCG tablet Take 500 mcg by mouth daily.        Home: Home Living Lives With: Alone Available Help at Discharge:  (pt plans to go to sons home however son works during the day) Type of Home: House Home Access: Level entry Home Layout: One level Bathroom Shower/Tub: Engineer, manufacturing systems: Standard  Functional History: Prior Function Driving: No Vocation: Retired Comments: Pt takes sponge baths alone however daughter in law helps with the bath tube. Functional Status:  Mobility: Bed Mobility Bed Mobility: Supine to Sit;Sitting - Scoot to Edge of Bed Supine to Sit: 3: Mod assist;HOB elevated (HOB 20) Supine to Sit: Patient Percentage: 50% Sitting - Scoot to Edge of Bed: 4: Min assist;With rail Transfers Transfers: Sit to Stand;Stand to Sit Sit to Stand: 4: Min assist;With upper extremity assist;From bed Sit to Stand: Patient Percentage: 60% Stand to Sit:  4: Min assist;With upper extremity assist;With armrests;To chair/3-in-1 Stand to Sit: Patient Percentage: 60% Stand Pivot Transfers: 1: +2 Total assist Stand Pivot Transfers: Patient Percentage: 50% Ambulation/Gait Ambulation/Gait Assistance: 4: Min assist Ambulation/Gait: Patient Percentage: 70% Ambulation Distance (Feet): 32 Feet Assistive device: Rolling walker Ambulation/Gait Assistance Details: assist to maneuver RW; cues for larger steps Gait Pattern: Step-to pattern;Decreased stride length;Trunk flexed Stairs: No Wheelchair Mobility Wheelchair Mobility: No  ADL:  Cognition: Cognition Arousal/Alertness: Awake/alert Orientation Level: Oriented X4 Cognition Overall Cognitive Status: Appears within functional limits for tasks assessed/performed Arousal/Alertness: Awake/alert Orientation Level: Appears intact for tasks assessed Behavior During Session: [facility] for tasks performed  Blood pressure 112/70, pulse 78, temperature 98.1 F (36.7 C), temperature source Oral, resp. rate 17, height 5\' 5"  (1.651 m), weight 55.929 kg (123 lb 4.8 oz), SpO2 98.00%. Physical Exam  Nursing note and vitals reviewed. Constitutional: She is oriented to person, place, and time.       Frail elderly female.  HENT:  Head: Normocephalic and atraumatic.  Right Ear: External ear normal.       Edentulous.   Eyes: Conjunctivae normal and EOM are normal. Pupils are equal, round, and reactive to light.  Neck: Normal range of motion.  Cardiovascular: Normal rate, regular rhythm and normal heart sounds.   Pulmonary/Chest: Effort normal. She has decreased breath sounds.  Abdominal: Soft. Bowel sounds are normal. She exhibits no distension.  Musculoskeletal: She exhibits tenderness (RLE with ROM.).       Dry dressings right hip and lower thigh. Tender to touch and ROM. Chest wall tender.  Neurological: She is alert and oriented to person, place, and time.  Skin: Skin is warm and dry.  Psychiatric: She  has a normal mood and affect. Her behavior is normal. Judgment and thought content normal.    Results for orders placed during the hospital encounter of 01/28/12 (from the past 24 hour(s))  PROTIME-INR     Status: Normal   Collection Time   02/01/12  7:12 PM      Component Value Range   Prothrombin Time 13.3  11.6 - 15.2 seconds   INR 1.02  0.00 - 1.49  PROTIME-INR     Status: Normal   Collection Time   02/02/12  6:18 AM      Component Value Range   Prothrombin Time 14.8  11.6 - 15.2 seconds   INR 1.18  0.00 - 1.49   Ct Angio Chest Pe W/cm &/or Wo Cm  02/01/2012  *RADIOLOGY REPORT*  Clinical Data: The patient has chronic chest pain with occasional heaviness in the middle of the chest.  The patient it is episodes in the 2-3 months.  No other associated symptoms but the patient does have chronic shortness of breath.  CT ANGIOGRAPHY CHEST  Technique:  Multidetector CT imaging of the chest using the standard protocol during bolus administration of intravenous contrast. Multiplanar reconstructed images including MIPs were obtained and reviewed to evaluate the vascular anatomy.  Contrast: 80mL OMNIPAQUE IOHEXOL 350 MG/ML SOLN  Comparison: Chest radiograph, 01/31/2012  Findings: There is no evidence of a pulmonary embolism.  The heart is normal in size and configuration.  There are mild to moderate coronary artery calcifications.  Atherosclerotic plaque is noted along the thoracic aorta.  There is no aortic dilation or dissection.  There is mild prominence of the right and left pulmonary arteries both measuring 23 mm.  No mediastinal or hilar masses or adenopathy.  There is a pulmonary anastomose the staple line at the posterior lateral right apex.  Changes of emphysema are noted bilaterally, fairly advanced and more prominent on the right.  There is chronic appearing bilateral apical lung scarring with other areas of coarse reticular peripheral lung scarring.  No pulmonary masses or nodules are seen.   There are no areas of consolidation.  There are reticular opacities that are dependent in the lower lobes consistent with subsegmental atelectasis.  There are small right  and minimal left pleural effusions, but no evidence of pulmonary edema.  The thoracic inlet is unremarkable.  Limited evaluation of the upper abdomen shows mild adrenal gland thickening, likely hyperplasia but is otherwise unremarkable.  There are mild degenerative changes of the thoracic spine.  No osteoblastic or osteolytic lesions.  IMPRESSION: No evidence of a pulmonary embolism.  Small right and minimal left pleural effusions but no evidence of pulmonary edema.  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  Postsurgical changes at the right apex likely from a bleb resection.  No infiltrate.   Original Report Authenticated By: Amie Portland, M.D.     Assessment/Plan: Diagnosis: Distal right femur fx, multiple rib fx's 1. Does the need for close, 24 hr/day medical supervision in concert with the patient's rehab needs make it unreasonable for this patient to be served in a less intensive setting? Yes 2. Co-Morbidities requiring supervision/potential complications: cad, copd, ckd 3. Due to bladder management, bowel management, safety, skin/wound care, disease management, medication administration, pain management and patient education, does the patient require 24 hr/day rehab nursing? Yes 4. Does the patient require coordinated care of a physician, rehab nurse, PT (1-2 hrs/day, 5 days/week) and OT (1-2 hrs/day, 5 days/week) to address physical and functional deficits in the context of the above medical diagnosis(es)? Yes Addressing deficits in the following areas: balance, endurance, locomotion, strength, transferring, bowel/bladder control, bathing, dressing, feeding, grooming and toileting 5. Can the patient actively participate in an intensive therapy program of at least 3 hrs of therapy per day at least 5 days per week? Yes 6. The  potential for patient to make measurable gains while on inpatient rehab is excellent 7. Anticipated functional outcomes upon discharge from inpatient rehab are mod I to supervision with PT, mod I to set up with OT, n/a with SLP. 8. Estimated rehab length of stay to reach the above functional goals is: 8-12 days 9. Does the patient have adequate social supports to accommodate these discharge functional goals? Yes 10. Anticipated D/C setting: Home 11. Anticipated post D/C treatments: HH therapy 12. Overall Rehab/Functional Prognosis: excellent  RECOMMENDATIONS: This patient's condition is appropriate for continued rehabilitative care in the following setting: CIR Patient has agreed to participate in recommended program. Yes Note that insurance prior authorization may be required for reimbursement for recommended care.  Comment: Pleasant lady! Quite motivated. Rehab RN to follow up.   Ivory Broad, MD     02/02/2012

## 2012-02-02 NOTE — Care Management Note (Signed)
    Page 1 of 1   02/02/2012     3:26:08 PM   CARE MANAGEMENT NOTE 02/02/2012  Patient:  [name] [name], [name] [name]   Account Number:  [id number]  Date Initiated:  02/01/2012  Documentation initiated by:  Anette Guarneri  Subjective/Objective Assessment:   fracture hip, IM nailing on 11/16     Action/Plan:   SNF   Anticipated DC Date:  02/04/2012   Anticipated DC Plan:  SKILLED NURSING FACILITY  In-house referral  Clinical Social Worker         Choice offered to / List presented to:             Status of service:   Medicare Important Message given?   (If response is "NO", the following Medicare IM given date fields will be blank) Date Medicare IM given:   Date Additional Medicare IM given:    Discharge Disposition:    Per UR Regulation:  Reviewed for med. necessity/level of care/duration of stay  If discussed at Long Length of Stay Meetings, dates discussed:   02/03/2012    Comments:  02-02-12 1524 [name] [name], [place] [phone] CM did speak to pt and she wants to go home. CM did call [name] to speak to family for disposition needs. CM did place a call to CIR to see if pt will be a candidate. Will need order for CIR to consult and OT ordered. MD is aware. [name] to contact son beforegoing forward with these orders. Will f/u.

## 2012-02-03 ENCOUNTER — Inpatient Hospital Stay (HOSPITAL_COMMUNITY)
Admission: RE | Admit: 2012-02-03 | Discharge: 2012-02-08 | DRG: 945 | Disposition: A | Discharge status: Skilled Nursing Facility | Payer: Medicare Other | Source: Ambulatory Visit | Attending: Physical Medicine & Rehabilitation | Admitting: Physical Medicine & Rehabilitation

## 2012-02-03 DIAGNOSIS — I2510 Atherosclerotic heart disease of native coronary artery without angina pectoris: Secondary | ICD-10-CM

## 2012-02-03 DIAGNOSIS — I129 Hypertensive chronic kidney disease with stage 1 through stage 4 chronic kidney disease, or unspecified chronic kidney disease: Secondary | ICD-10-CM

## 2012-02-03 DIAGNOSIS — S72033A Displaced midcervical fracture of unspecified femur, initial encounter for closed fracture: Secondary | ICD-10-CM

## 2012-02-03 DIAGNOSIS — R1013 Epigastric pain: Secondary | ICD-10-CM

## 2012-02-03 DIAGNOSIS — I480 Paroxysmal atrial fibrillation: Secondary | ICD-10-CM | POA: Diagnosis not present

## 2012-02-03 DIAGNOSIS — S72001A Fracture of unspecified part of neck of right femur, initial encounter for closed fracture: Secondary | ICD-10-CM | POA: Diagnosis present

## 2012-02-03 DIAGNOSIS — Z96649 Presence of unspecified artificial hip joint: Secondary | ICD-10-CM

## 2012-02-03 DIAGNOSIS — S2239XA Fracture of one rib, unspecified side, initial encounter for closed fracture: Secondary | ICD-10-CM

## 2012-02-03 DIAGNOSIS — I252 Old myocardial infarction: Secondary | ICD-10-CM

## 2012-02-03 DIAGNOSIS — W19XXXA Unspecified fall, initial encounter: Secondary | ICD-10-CM

## 2012-02-03 DIAGNOSIS — B961 Klebsiella pneumoniae [K. pneumoniae] as the cause of diseases classified elsewhere: Secondary | ICD-10-CM

## 2012-02-03 DIAGNOSIS — I10 Essential (primary) hypertension: Secondary | ICD-10-CM | POA: Diagnosis present

## 2012-02-03 DIAGNOSIS — F17200 Nicotine dependence, unspecified, uncomplicated: Secondary | ICD-10-CM

## 2012-02-03 DIAGNOSIS — M129 Arthropathy, unspecified: Secondary | ICD-10-CM

## 2012-02-03 DIAGNOSIS — Z5189 Encounter for other specified aftercare: Principal | ICD-10-CM

## 2012-02-03 DIAGNOSIS — S72409A Unspecified fracture of lower end of unspecified femur, initial encounter for closed fracture: Secondary | ICD-10-CM

## 2012-02-03 DIAGNOSIS — R32 Unspecified urinary incontinence: Secondary | ICD-10-CM

## 2012-02-03 DIAGNOSIS — Z7982 Long term (current) use of aspirin: Secondary | ICD-10-CM

## 2012-02-03 DIAGNOSIS — I4891 Unspecified atrial fibrillation: Secondary | ICD-10-CM

## 2012-02-03 DIAGNOSIS — J449 Chronic obstructive pulmonary disease, unspecified: Secondary | ICD-10-CM

## 2012-02-03 DIAGNOSIS — W07XXXA Fall from chair, initial encounter: Secondary | ICD-10-CM

## 2012-02-03 DIAGNOSIS — K219 Gastro-esophageal reflux disease without esophagitis: Secondary | ICD-10-CM

## 2012-02-03 DIAGNOSIS — D62 Acute posthemorrhagic anemia: Secondary | ICD-10-CM

## 2012-02-03 DIAGNOSIS — Z79899 Other long term (current) drug therapy: Secondary | ICD-10-CM

## 2012-02-03 DIAGNOSIS — Z9981 Dependence on supplemental oxygen: Secondary | ICD-10-CM

## 2012-02-03 DIAGNOSIS — D696 Thrombocytopenia, unspecified: Secondary | ICD-10-CM

## 2012-02-03 DIAGNOSIS — S2249XA Multiple fractures of ribs, unspecified side, initial encounter for closed fracture: Secondary | ICD-10-CM

## 2012-02-03 DIAGNOSIS — N189 Chronic kidney disease, unspecified: Secondary | ICD-10-CM

## 2012-02-03 DIAGNOSIS — J4489 Other specified chronic obstructive pulmonary disease: Secondary | ICD-10-CM

## 2012-02-03 DIAGNOSIS — N318 Other neuromuscular dysfunction of bladder: Secondary | ICD-10-CM

## 2012-02-03 DIAGNOSIS — N390 Urinary tract infection, site not specified: Secondary | ICD-10-CM

## 2012-02-03 DIAGNOSIS — B952 Enterococcus as the cause of diseases classified elsewhere: Secondary | ICD-10-CM

## 2012-02-03 LAB — CBC
MCHC: 32.1 g/dL (ref 30.0–36.0)
RDW: 18.6 % — ABNORMAL HIGH (ref 11.5–15.5)

## 2012-02-03 LAB — PROTIME-INR
INR: 2.15 — ABNORMAL HIGH (ref 0.00–1.49)
Prothrombin Time: 23.1 seconds — ABNORMAL HIGH (ref 11.6–15.2)

## 2012-02-03 MED ORDER — BISACODYL 10 MG RE SUPP: 10.0000 mg | Freq: Every day | RECTAL | Status: DC | PRN

## 2012-02-03 MED ORDER — ALUM & MAG HYDROXIDE-SIMETH 200-200-20 MG/5ML PO SUSP: 30.0000 mL | ORAL | Status: DC | PRN

## 2012-02-03 MED ORDER — OXYCODONE HCL 5 MG PO TABS
5.0000 mg | ORAL_TABLET | ORAL | Status: DC | PRN
  Administered 2012-02-03 – 2012-02-07 (×5): 5 mg via ORAL
  Filled 2012-02-03 (×6): qty 1

## 2012-02-03 MED ORDER — TRAMADOL HCL 50 MG PO TABS
50.0000 mg | ORAL_TABLET | Freq: Four times a day (QID) | ORAL | Status: DC | PRN
  Administered 2012-02-04 – 2012-02-08 (×9): 50 mg via ORAL
  Filled 2012-02-03 (×9): qty 1

## 2012-02-03 MED ORDER — ACETAMINOPHEN 325 MG PO TABS: 325.0000 mg | ORAL_TABLET | ORAL | Status: DC | PRN

## 2012-02-03 MED ORDER — DIGOXIN 250 MCG PO TABS
0.2500 mg | ORAL_TABLET | Freq: Every day | ORAL | Status: DC
  Administered 2012-02-04 – 2012-02-08 (×5): 0.25 mg via ORAL
  Filled 2012-02-03 (×6): qty 1

## 2012-02-03 MED ORDER — METOPROLOL SUCCINATE ER 25 MG PO TB24
25.0000 mg | ORAL_TABLET | Freq: Every day | ORAL | Status: DC
  Administered 2012-02-04 – 2012-02-08 (×5): 25 mg via ORAL
  Filled 2012-02-03 (×6): qty 1

## 2012-02-03 MED ORDER — CIPROFLOXACIN HCL 250 MG PO TABS
250.0000 mg | ORAL_TABLET | Freq: Two times a day (BID) | ORAL | Status: AC
  Administered 2012-02-03 – 2012-02-06 (×6): 250 mg via ORAL
  Filled 2012-02-03 (×6): qty 1

## 2012-02-03 MED ORDER — PANTOPRAZOLE SODIUM 40 MG PO TBEC
40.0000 mg | DELAYED_RELEASE_TABLET | Freq: Every day | ORAL | Status: DC
  Administered 2012-02-04 – 2012-02-08 (×5): 40 mg via ORAL
  Filled 2012-02-03 (×4): qty 1

## 2012-02-03 MED ORDER — METHOCARBAMOL 500 MG PO TABS: 500.0000 mg | ORAL_TABLET | Freq: Four times a day (QID) | ORAL | Status: DC | PRN

## 2012-02-03 MED ORDER — DIPHENHYDRAMINE HCL 12.5 MG/5ML PO ELIX: 12.5000 mg | ORAL_SOLUTION | Freq: Four times a day (QID) | ORAL | Status: DC | PRN

## 2012-02-03 MED ORDER — LEVALBUTEROL HCL 0.63 MG/3ML IN NEBU
0.6300 mg | INHALATION_SOLUTION | Freq: Four times a day (QID) | RESPIRATORY_TRACT | Status: DC | PRN
  Filled 2012-02-03: qty 3

## 2012-02-03 MED ORDER — ONDANSETRON HCL 4 MG/2ML IJ SOLN: 4.0000 mg | Freq: Four times a day (QID) | INTRAMUSCULAR | Status: DC | PRN

## 2012-02-03 MED ORDER — FLEET ENEMA 7-19 GM/118ML RE ENEM: 1.0000 | ENEMA | Freq: Once | RECTAL | Status: AC | PRN

## 2012-02-03 MED ORDER — LEVALBUTEROL HCL 0.63 MG/3ML IN NEBU
0.6300 mg | INHALATION_SOLUTION | Freq: Four times a day (QID) | RESPIRATORY_TRACT | Status: DC | PRN
  Administered 2012-02-04 – 2012-02-08 (×5): 0.63 mg via RESPIRATORY_TRACT
  Filled 2012-02-03: qty 3

## 2012-02-03 MED ORDER — LEVALBUTEROL HCL 0.63 MG/3ML IN NEBU
0.6300 mg | INHALATION_SOLUTION | Freq: Two times a day (BID) | RESPIRATORY_TRACT | Status: DC
  Administered 2012-02-03 – 2012-02-08 (×7): 0.63 mg via RESPIRATORY_TRACT
  Filled 2012-02-03 (×12): qty 3

## 2012-02-03 MED ORDER — GUAIFENESIN-DM 100-10 MG/5ML PO SYRP: 5.0000 mL | ORAL_SOLUTION | Freq: Four times a day (QID) | ORAL | Status: DC | PRN

## 2012-02-03 MED ORDER — NICOTINE 21 MG/24HR TD PT24
21.0000 mg | MEDICATED_PATCH | Freq: Every day | TRANSDERMAL | Status: DC
  Administered 2012-02-04 – 2012-02-08 (×5): 21 mg via TRANSDERMAL
  Filled 2012-02-03 (×6): qty 1

## 2012-02-03 MED ORDER — TRAZODONE HCL 50 MG PO TABS: 25.0000 mg | ORAL_TABLET | Freq: Every evening | ORAL | Status: DC | PRN

## 2012-02-03 MED ORDER — POLYSACCHARIDE IRON COMPLEX 150 MG PO CAPS
150.0000 mg | ORAL_CAPSULE | Freq: Every day | ORAL | Status: DC
  Administered 2012-02-04 – 2012-02-07 (×4): 150 mg via ORAL
  Filled 2012-02-03 (×5): qty 1

## 2012-02-03 MED ORDER — ONDANSETRON HCL 4 MG PO TABS: 4.0000 mg | ORAL_TABLET | Freq: Four times a day (QID) | ORAL | Status: DC | PRN

## 2012-02-03 MED ORDER — FESOTERODINE FUMARATE ER 4 MG PO TB24
4.0000 mg | ORAL_TABLET | Freq: Every day | ORAL | Status: DC
  Administered 2012-02-04 – 2012-02-08 (×5): 4 mg via ORAL
  Filled 2012-02-03 (×6): qty 1

## 2012-02-03 MED ORDER — WARFARIN - PHARMACIST DOSING INPATIENT: Freq: Every day | Status: DC

## 2012-02-03 MED ORDER — HYDROCERIN EX CREA
TOPICAL_CREAM | Freq: Two times a day (BID) | CUTANEOUS | Status: DC
  Administered 2012-02-03 – 2012-02-08 (×8): via TOPICAL
  Filled 2012-02-03: qty 113

## 2012-02-03 MED ORDER — DILTIAZEM HCL ER COATED BEADS 240 MG PO CP24
240.0000 mg | ORAL_CAPSULE | Freq: Every day | ORAL | Status: DC
  Administered 2012-02-04 – 2012-02-08 (×5): 240 mg via ORAL
  Filled 2012-02-03 (×6): qty 1

## 2012-02-03 MED ORDER — SENNOSIDES-DOCUSATE SODIUM 8.6-50 MG PO TABS: 2.0000 | ORAL_TABLET | Freq: Every evening | ORAL | Status: DC | PRN

## 2012-02-03 NOTE — Discharge Summary (Signed)
Patient seen and examined. Agree with note by Toya Smothers, NP. Plan is to DC to CIR today for continued rehab post-hip repair. Her thrombocytopenia is improving with discontinuation of the arixtra. I would recheck her platelet count in 1 week. Complete cipro for her UTI.  Peggye Pitt, MD [facility] Pager: [phone]

## 2012-02-03 NOTE — Discharge Summary (Signed)
Physician Discharge Summary  [name] [name] UJW:[id number] DOB: 1940-05-20 DOA: 01/28/2012  PCP: Pcp Not In System  Admit date: 01/28/2012 Discharge date: 02/03/2012  Time spent: 40 minutes  Recommendations for Outpatient Follow-up:  1. Pt being discharged to in patient rehab.   Discharge Diagnoses:  Principal Problem:  *Fracture of right hip Active Problems:  Multiple rib fractures  Fall due to stumbling  CAD (coronary artery disease)  Hypertension  COPD (chronic obstructive pulmonary disease)  CKD (chronic kidney disease)  Osteoarthritis  UTI (lower urinary tract infection)  Thrombocytopenia  Paroxysmal atrial fibrillation   Discharge Condition: medically stable and ready  Diet recommendation: carb modified  Filed Weights   01/29/12 0117 02/01/12 1215  Weight: 52.345 kg (115 lb 6.4 oz) 55.929 kg (123 lb 4.8 oz)    History of present illness:  [name] [name] is a very pleasant 71 y.o. Female pmx including COPD, CKD,  who suffered a fall in her home on 01/29/12 when she fell as she was trying to sit in a chair and missed the chair. She fell onto her right side injuring her Right hip and Right chest wall. She denied having syncope or dizziness associated with her fall. In the ED she was found to have fractures of the Right Hip, and right ribs X 2. Orthopedics was consulted by the EDP and Dr. Lajoyce Corners agreed to consult. She was admitted to medicine service      Hospital Course:  Paroxysmal atrial fibrillation. Developed on 11/19 likely related to underlying COPD in setting of post op anemia. Seen by Dr Jacinto Halim with cardiology and placed on cardizem drip with rate controled quickly. Transitioned to po cardizem on 11/20.  TSH 1.2.  Also started dig and continued home BB.   Right femoral neck fracture :Retained intramedullary nail right femur locked proximally and distally, status post right hip Hemiarthroplasty on 01/29/12. PWBAT. Evaluated by inpatient rehab and accepted.      Anemia/ thrombocytopenia. Hg lowest point 8.1 11/17. At discharge Hg 10.6 S/P 2 units PRBC's. Platelets normal in July 2013 and dropped during this hospitalization likely related to heparin products. These products have been discontinued and platelets trending up. Recommend rechecking in 1 week.    Sinus tachycardia. Likely related to symptomatic anemia. s/p 2 units of packed red blood cells, DVT/PE ruled out. At discharge resolved.   Urinary tract infection. Pt had 3 days of rocephin then transitioned to po cipro. Will need to complete 7 days total. Last day 02/06/12. Cultures with ecoli, klebsiella pneumoniae. Pt with urinary incontinence.   Hypertension: fair control. SBP range 112-119 Continue to hold norvasc., continue Toprol-XL 25 mg daily.      Procedures:  Rt arthroplasty hip  Consultations:  Cardiology Dr Jacinto Halim  Orthopedics Dr Lajoyce Corners  Discharge Exam: Filed Vitals:   02/02/12 2100 02/03/12 0500 02/03/12 0849 02/03/12 0928  BP: 113/67 119/48  115/63  Pulse: 81 73  92  Temp: 97.4 F (36.3 C) 98 F (36.7 C)    TempSrc:      Resp: 18 18    Height:      Weight:      SpO2: 98% 98% 96%     General: awake alert oriented Cardiovascular: RRR No MGR No LEE Respiratory: normal effort BS slightly diminished.   Discharge Instructions     Medication List     As of 02/03/2012 10:42 AM    TAKE these medications         amLODipine 10 MG tablet  Commonly known as: NORVASC   Take 10 mg by mouth daily.      aspirin 81 MG chewable tablet   Chew 81 mg by mouth daily.      docusate sodium 100 MG capsule   Commonly known as: COLACE   Take 100 mg by mouth daily as needed. For constipation      fish oil-omega-3 fatty acids 1000 MG capsule   Take 1 g by mouth daily.      furosemide 20 MG tablet   Commonly known as: LASIX   Take 10 mg by mouth daily as needed. For leg edema      ibuprofen 200 MG tablet   Commonly known as: ADVIL,MOTRIN   Take 600 mg by mouth every 6  (six) hours as needed. For pain      ipratropium-albuterol 0.5-2.5 (3) MG/3ML Soln   Commonly known as: DUONEB   Take 3 mLs by nebulization every 4 (four) hours as needed. For shortness of breath or wheezing      loperamide 2 MG capsule   Commonly known as: IMODIUM   Take 2 mg by mouth 4 (four) times daily as needed. For diarrhea.      meclizine 12.5 MG tablet   Commonly known as: ANTIVERT   Take 25 mg by mouth every 4 (four) hours as needed. For dizziness      metoprolol succinate 50 MG 24 hr tablet   Commonly known as: TOPROL-XL   Take 25 mg by mouth daily. Take with or immediately following a meal.      omeprazole 20 MG capsule   Commonly known as: PRILOSEC   Take 20 mg by mouth daily.      OVER THE COUNTER MEDICATION   Take 1-3 tablets by mouth at bedtime as needed. Hyland's Leg Cramps PM for sleep and leg cramps      pravastatin 80 MG tablet   Commonly known as: PRAVACHOL   Take 80 mg by mouth at bedtime.      senna-docusate 8.6-50 MG per tablet   Commonly known as: Senokot-S   Take 2-4 tablets by mouth daily as needed. For constipation      TOVIAZ 4 MG Tb24   Generic drug: fesoterodine   Take 4 mg by mouth at bedtime.      traMADol 50 MG tablet   Commonly known as: ULTRAM   Take 50-100 mg by mouth 4 (four) times daily as needed. For back pain      vitamin B-12 500 MCG tablet   Commonly known as: CYANOCOBALAMIN   Take 500 mcg by mouth daily.        Follow-up Information    Follow up with DUDA,MARCUS V, MD. In 3 weeks.   Contact information:   [address] [place] [address] [phone]       Follow up with Pamella Pert, MD. Call in 1 week. (For out patient cardiac f/u. )    Contact information:   [address] [place] [address] [phone]           The results of significant diagnostics from this hospitalization (including imaging, microbiology, ancillary and laboratory) are listed below for reference.     Significant Diagnostic Studies: Dg Chest 2 View  01/31/2012  *RADIOLOGY REPORT*  Clinical Data: 71 year old female status post left hip surgery. Shortness of breath.  History of fall with rib fractures.  CHEST - 2 VIEW  Comparison: 01/29/2012 and earlier.  Findings: Stable large  lung volumes.  Chronic postoperative changes to the right lung apex with scarring and staple line.  No pneumothorax.  No pleural effusion.  No pulmonary edema.  No acute pulmonary opacity; left upper lobe opacity no longer present. Stable cardiac size and mediastinal contours.  Visualized tracheal air column is within normal limits.  Osteopenia. Stable visualized osseous structures.  IMPRESSION: Stable hyperinflation and chronic lung changes.  No acute cardiopulmonary abnormality.  Recent right fifth and sixth rib fractures better demonstrated on 01/28/2012.   Original Report Authenticated By: Erskine Speed, M.D.    Dg Ribs Unilateral W/chest Right  01/28/2012  *RADIOLOGY REPORT*  Clinical Data: Fall, rib pain, shortness of breath  RIGHT RIBS AND CHEST - 3+ VIEW  Comparison: 10/13/2011  Findings: Chronic interstitial markings/emphysematous changes. Biapical pleural parenchymal scarring. Postsurgical changes/suture lines in the right upper lung.  Cardiomediastinal silhouette is within normal limits.  Suspected right lateral 5th and 6th rib fractures.  IMPRESSION: Suspected right lateral 5th and 6th rib fractures.   Original Report Authenticated By: Charline Bills, M.D.    Dg Hip Complete Right  01/28/2012  *RADIOLOGY REPORT*  Clinical Data: Fall, right hip pain  RIGHT HIP - COMPLETE 2+ VIEW  Comparison: 06/13/2006  Findings: Subcapital right hip fracture with secondary varus deformity/foreshortening.  Prior ORIF of a proximal femur fracture with associated deformity.  IMPRESSION: Subcapital right hip fracture.   Original Report Authenticated By: Charline Bills, M.D.    Ct Angio Chest Pe W/cm &/or Wo Cm  02/01/2012   *RADIOLOGY REPORT*  Clinical Data: The patient has chronic chest pain with occasional heaviness in the middle of the chest.  The patient it is episodes in the 2-3 months.  No other associated symptoms but the patient does have chronic shortness of breath.  CT ANGIOGRAPHY CHEST  Technique:  Multidetector CT imaging of the chest using the standard protocol during bolus administration of intravenous contrast. Multiplanar reconstructed images including MIPs were obtained and reviewed to evaluate the vascular anatomy.  Contrast: 80mL OMNIPAQUE IOHEXOL 350 MG/ML SOLN  Comparison: Chest radiograph, 01/31/2012  Findings: There is no evidence of a pulmonary embolism.  The heart is normal in size and configuration.  There are mild to moderate coronary artery calcifications.  Atherosclerotic plaque is noted along the thoracic aorta.  There is no aortic dilation or dissection.  There is mild prominence of the right and left pulmonary arteries both measuring 23 mm.  No mediastinal or hilar masses or adenopathy.  There is a pulmonary anastomose the staple line at the posterior lateral right apex.  Changes of emphysema are noted bilaterally, fairly advanced and more prominent on the right.  There is chronic appearing bilateral apical lung scarring with other areas of coarse reticular peripheral lung scarring.  No pulmonary masses or nodules are seen.  There are no areas of consolidation.  There are reticular opacities that are dependent in the lower lobes consistent with subsegmental atelectasis.  There are small right and minimal left pleural effusions, but no evidence of pulmonary edema.  The thoracic inlet is unremarkable.  Limited evaluation of the upper abdomen shows mild adrenal gland thickening, likely hyperplasia but is otherwise unremarkable.  There are mild degenerative changes of the thoracic spine.  No osteoblastic or osteolytic lesions.  IMPRESSION: No evidence of a pulmonary embolism.  Small right and minimal left  pleural effusions but no evidence of pulmonary edema.  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  Postsurgical changes at the right apex likely from  a bleb resection.  No infiltrate.   Original Report Authenticated By: Amie Portland, M.D.    Dg Chest Port 1 View  01/29/2012  *RADIOLOGY REPORT*  Clinical Data: Cough and shortness of breath  PORTABLE CHEST - 1 VIEW  Comparison: 01/28/2012  Findings: Chronic interstitial markings/emphysematous changes. Biapical pleural parenchymal scarring.  Postsurgical changes in the right upper lung.  Developing opacity/aspiration in the left lung apex is possible, but is not convincing on the current study.  No pleural effusion or pneumothorax.  Cardiomediastinal silhouette is within normal limits.  Suspected nondisplaced right lateral rib fracture.  IMPRESSION: Suspected nondisplaced right lateral rib fracture.  Chronic interstitial markings/emphysematous changes with postsurgical changes in the right upper lung.  Developing opacity/aspiration in the left lung apex is possible.   Original Report Authenticated By: Charline Bills, M.D.    Dg Femur Right Port  01/29/2012  *RADIOLOGY REPORT*  Clinical Data: Right intramedullary nail placement.  Evaluate distal aspect of the nail.  PORTABLE RIGHT FEMUR - 2 VIEW  Comparison: 01/28/2012.  Findings: In conjunction with the prior radiographs, there is an antegrade right femoral nail.  There are two distal interlocking screws.  There is lucency around the inferior screw suggesting some motion.  Atherosclerosis is present in the superficial femoral and popliteal arteries.  There is no fracture of the distal femur. Valgus deformity of the knee appears present.  IMPRESSION: Intramedullary nail with two distal interlocking screws.  Mild loosening of the distal interlocking screw.   Original Report Authenticated By: Andreas Newport, M.D.     Microbiology: Recent Results (from the past 240 hour(s))  URINE CULTURE     Status:  Normal   Collection Time   01/29/12  9:00 AM      Component Value Range Status Comment   Specimen Description URINE, CATHETERIZED   Final    Special Requests NONE   Final    Culture  Setup Time 01/29/2012 14:56   Final    Colony Count >=100,000 COLONIES/ML   Final    Culture     Final    Value: ESCHERICHIA COLI     KLEBSIELLA PNEUMONIAE   Report Status 01/31/2012 FINAL   Final    Organism ID, Bacteria ESCHERICHIA COLI   Final    Organism ID, Bacteria KLEBSIELLA PNEUMONIAE   Final   SURGICAL PCR SCREEN     Status: Normal   Collection Time   01/29/12 12:34 PM      Component Value Range Status Comment   MRSA, PCR NEGATIVE  NEGATIVE Final    Staphylococcus aureus NEGATIVE  NEGATIVE Final      Labs: Basic Metabolic Panel:  Lab 01/31/12 1610 01/30/12 0551 01/29/12 0455 01/28/12 2111  NA 137 134* 138 137  K 3.8 4.0 4.1 4.3  CL 103 102 104 100  CO2 27 26 28 29   GLUCOSE 105* 116* 116* 141*  BUN 13 19 17 17   CREATININE 0.46* 0.68 0.58 0.65  CALCIUM 7.9* 7.7* 8.6 9.4  MG -- -- -- --  PHOS -- -- -- --   Liver Function Tests: No results found for this basename: AST:5,ALT:5,ALKPHOS:5,BILITOT:5,PROT:5,ALBUMIN:5 in the last 168 hours No results found for this basename: LIPASE:5,AMYLASE:5 in the last 168 hours No results found for this basename: AMMONIA:5 in the last 168 hours CBC:  Lab 02/03/12 0600 02/01/12 0615 01/31/12 2230 01/31/12 1352 01/30/12 1620 01/30/12 0551 01/28/12 2111  WBC 5.5 6.5 -- 5.7 7.3 8.0 --  NEUTROABS -- 4.0 3.8 -- -- 5.2 8.1*  HGB 10.6* 10.5* -- 8.6* 8.1* 8.4* --  HCT 33.0* 32.1* -- 27.4* 26.3* 27.4* --  MCV 81.5 81.3 -- 79.9 78.3 79.0 --  PLT 62* 43* -- 37* 45* 45* --   Cardiac Enzymes:  Lab 01/31/12 2231 01/28/12 2111  CKTOTAL -- 27  CKMB -- --  CKMBINDEX -- --  TROPONINI <0.30 --   BNP: BNP (last 3 results) No results found for this basename: PROBNP:3 in the last 8760 hours CBG: No results found for this basename: GLUCAP:5 in the last 168  hours     Signed:  Gwenyth Bender NP [facility] 02/03/2012, 10:42 AM

## 2012-02-03 NOTE — H&P (Signed)
Physical Medicine and Rehabilitation Admission H&P    Chief Complaint  Patient presents with  . Right hip fracture  : HPI: [name] [name] is a 71 y.o. female with history of CKD, CAD, severe COPD--O2 dependent who fell while trying to sit in a chair and sustained right hip fracture and right 5th and 6th rib fractures on 01/29/12. She underwent right hip hemi on the same day by Dr. Lajoyce Corners. Is WBAT and on coumadin for DVT prophylaxis. Post op developed Atrial fibrillation with rapid response. Dr. Jacinto Halim consulted and felt that patient likely with asymptomatic PAF with current anemia contributing to arrythmia. She was transfused with 2 units PRBC. She was started on IV cardizem and coumadin recommended prevention of cardioembolic phenomena. BLE doppler negative for DVT. CTA chest done due to history of chronic intermittent CP and was negative for PE.  Treated with IV rocephin for E. Coli/Kleb UTI. She developed thrombocytopenia ?due to lovenox or arixtra.--HIT panel negative. Therapies ongoing and patient refusing SNF. MD recommending CIR for progression.  Review of Systems  HENT: Positive for hearing loss.   Respiratory: Positive for shortness of breath.   Cardiovascular: Negative for chest pain and palpitations.  Gastrointestinal: Positive for heartburn, nausea and constipation.       Reports history of gastric ulcers in the past.  Genitourinary: Positive for urgency and frequency.       Stress incontinence. Wears pull ups at home.   Musculoskeletal:       Left heel pain.  Neurological: Positive for weakness. Negative for dizziness and headaches.    Past Medical History  Diagnosis Date  . Hypertension   . Myocardial infarction   . Shortness of breath   . Asthma   . COPD (chronic obstructive pulmonary disease)     emphysema    . Pneumonia     hx pneumonia ,chronic bronchitis   . H/O blood clots   . Chronic kidney disease     current   UTI   being treated  . GERD (gastroesophageal  reflux disease)   . Headache     hx migraines  . Blood dyscrasia     hx blood clotts  . Arthritis    Past Surgical History  Procedure Date  . Right leg   . Hip fracture surgery     RIGHT SIDE  . Shoulder surgery     RIGHT  . Cholecystectomy   . Eye surgery     BIL CATARACT   . Orif wrist fracture 10/13/2011    Procedure: OPEN REDUCTION INTERNAL FIXATION (ORIF) WRIST FRACTURE;  Surgeon: Eldred Manges, MD;  Location: [facility] OR;  Service: Orthopedics;  Laterality: Right;  Open Reduction Internal Fixation Right Distal Radius   . Abdominal hysterectomy   . Btl   . Hardware removal 01/29/2012    Procedure: HARDWARE REMOVAL;  Surgeon: Nadara Mustard, MD;  Location: [facility] OR;  Service: Orthopedics;  Laterality: Right;  . Hip arthroplasty 01/29/2012    Procedure: ARTHROPLASTY BIPOLAR HIP;  Surgeon: Nadara Mustard, MD;  Location: [facility] OR;  Service: Orthopedics;  Laterality: Right;   Family History  Problem Relation Age of Onset  . Cancer - Lung Mother   . Coronary artery disease Mother   . Coronary artery disease Sister   . Coronary artery disease Brother    Social History: Lives alone. She reports that she has been smoking 2 PPD.  She has a 50 pack-year smoking history. She does not have any smokeless tobacco history  on file. She reports that she does not drink alcohol or use illicit drugs . Allergies: No Known Allergies   Scheduled Meds:    Medications Prior to Admission  Medication Sig Dispense Refill  . amLODipine (NORVASC) 10 MG tablet Take 10 mg by mouth daily.      Marland Kitchen aspirin 81 MG chewable tablet Chew 81 mg by mouth daily.      Marland Kitchen docusate sodium (COLACE) 100 MG capsule Take 100 mg by mouth daily as needed. For constipation      . fesoterodine (TOVIAZ) 4 MG TB24 Take 4 mg by mouth at bedtime.      . fish oil-omega-3 fatty acids 1000 MG capsule Take 1 g by mouth daily.      . furosemide (LASIX) 20 MG tablet Take 10 mg by mouth daily as needed. For leg edema      . ibuprofen  (ADVIL,MOTRIN) 200 MG tablet Take 600 mg by mouth every 6 (six) hours as needed. For pain      . ipratropium-albuterol (DUONEB) 0.5-2.5 (3) MG/3ML SOLN Take 3 mLs by nebulization every 4 (four) hours as needed. For shortness of breath or wheezing      . loperamide (IMODIUM) 2 MG capsule Take 2 mg by mouth 4 (four) times daily as needed. For diarrhea.      . meclizine (ANTIVERT) 12.5 MG tablet Take 25 mg by mouth every 4 (four) hours as needed. For dizziness      . metoprolol succinate (TOPROL-XL) 50 MG 24 hr tablet Take 25 mg by mouth daily. Take with or immediately following a meal.      . omeprazole (PRILOSEC) 20 MG capsule Take 20 mg by mouth daily.      Marland Kitchen OVER THE COUNTER MEDICATION Take 1-3 tablets by mouth at bedtime as needed. Hyland's Leg Cramps PM for sleep and leg cramps      . pravastatin (PRAVACHOL) 80 MG tablet Take 80 mg by mouth at bedtime.      . senna-docusate (SENOKOT-S) 8.6-50 MG per tablet Take 2-4 tablets by mouth daily as needed. For constipation      . traMADol (ULTRAM) 50 MG tablet Take 50-100 mg by mouth 4 (four) times daily as needed. For back pain      . vitamin B-12 (CYANOCOBALAMIN) 500 MCG tablet Take 500 mcg by mouth daily.        Home:     Functional History:    Functional Status:  Mobility:          ADL:    Cognition:       There were no vitals taken for this visit. Physical Exam  Nursing note and vitals reviewed. Constitutional: She is oriented to person, place, and time.       Frail appearing female, appears older than stated age.   HENT:  Head: Normocephalic and atraumatic.  Eyes: Pupils are equal, round, and reactive to light.  Neck: Normal range of motion. Neck supple.  Cardiovascular: Normal rate and regular rhythm.   Abdominal: Soft. Bowel sounds are normal.  Neurological: She is alert and oriented to person, place, and time.  Motor strength is 5/5 in bilateral deltoid, biceps, triceps, grip 5/5 in left hip flexor knee extensor  ankle dorsiflexor and plantar flexor Right lower extremity 3/5 in the hip flexor, 3 minus knee extensor, 4+ in the ankle dorsiflexor and plantar flexor Sensation is intact to light touch in both upper and lower extremities Left lateral hip incision staples intact no evidence  of drainage dried blood, left lateral knee incision clean dry intact no evidence of drainage or erythema Lungs with bilateral wheezing decreased breath sounds in both bases Results for orders placed during the hospital encounter of 01/28/12 (from the past 48 hour(s))  PROTIME-INR     Status: Normal   Collection Time   02/01/12  7:12 PM      Component Value Range Comment   Prothrombin Time 13.3  11.6 - 15.2 seconds    INR 1.02  0.00 - 1.49   PROTIME-INR     Status: Normal   Collection Time   02/02/12  6:18 AM      Component Value Range Comment   Prothrombin Time 14.8  11.6 - 15.2 seconds    INR 1.18  0.00 - 1.49   PROTIME-INR     Status: Abnormal   Collection Time   02/03/12  6:00 AM      Component Value Range Comment   Prothrombin Time 23.1 (*) 11.6 - 15.2 seconds    INR 2.15 (*) 0.00 - 1.49   CBC     Status: Abnormal   Collection Time   02/03/12  6:00 AM      Component Value Range Comment   WBC 5.5  4.0 - 10.5 K/uL    RBC 4.05  3.87 - 5.11 MIL/uL    Hemoglobin 10.6 (*) 12.0 - 15.0 g/dL    HCT 81.1 (*) 91.4 - 46.0 %    MCV 81.5  78.0 - 100.0 fL    MCH 26.2  26.0 - 34.0 pg    MCHC 32.1  30.0 - 36.0 g/dL    RDW 78.2 (*) 95.6 - 15.5 %    Platelets 62 (*) 150 - 400 K/uL    No results found.  Post Admission Physician Evaluation: 1. Functional deficits secondary  to Right hip fracture, right rib fractures. 2. Patient is admitted to receive collaborative, interdisciplinary care between the physiatrist, rehab nursing staff, and therapy team. 3. Patient's level of medical complexity and substantial therapy needs in context of that medical necessity cannot be provided at a lesser intensity of care such as a  SNF. 4. Patient has experienced substantial functional loss from his/her baseline which was documented above under the "Functional History" and "Functional Status" headings.  Judging by the patient's diagnosis, physical exam, and functional history, the patient has potential for functional progress which will result in measurable gains while on inpatient rehab.  These gains will be of substantial and practical use upon discharge  in facilitating mobility and self-care at the household level. 5. Physiatrist will provide 24 hour management of medical needs as well as oversight of the therapy plan/treatment and provide guidance as appropriate regarding the interaction of the two. 6. 24 hour rehab nursing will assist with bladder management, bowel management, safety, skin/wound care, disease management, medication administration, pain management and patient education  and help integrate therapy concepts, techniques,education, etc. 7. PT will assess and treat for:  Pre-gait training, gait training, safety, endurance, equipment.  Goals are: Provision to modified independent level with mobility. 8. OT will assess and treat for: ADLs, cognitive perceptual skills, safety awareness, equipment.   Goals are: Modified independent level ADLs. 9. SLP will assess and treat for: Not applicable.  Goals are: Not applicable. 10. Case Management and Social Worker will assess and treat for psychological issues and discharge planning. 11. Team conference will be held weekly to assess progress toward goals and to determine barriers to discharge. 12. Patient  will receive at least 3 hours of therapy per day at least 5 days per week. 13. ELOS: 10 days      Prognosis:  good   Medical Problem List and Plan: 1. DVT Prophylaxis/Anticoagulation: Pharmaceutical: Coumadin 2. Pain Management: continue prn oxycodone. 3. Mood: Motivated to go home. Will have LCSW follow up for formal evaluation. 4. Neuropsych: This patient is capable  of making decisions on his/her own behalf. 5. PAF: Monitor HR with bid checks.  Continue digoxin and Cardizem for rate control.  On coumadin to prevent cardioembolic phenomena.  6. E coli/ Klebs pneumo UTI: continue cipro. Antibiotic D#5/7 7. ABLA:  Improved past transfusion.   Recheck in am. Add iron supplement  8. GERD: Reporting increased symptoms off prilosec. Resume PPI. 9. Hyperactive bladder: complete antibiotic regimen. Will resume toviaz past  PVRs checks to rule out overflow.    02/03/2012, 4:54 PM

## 2012-02-03 NOTE — Progress Notes (Signed)
At 1725, patient arrived from [address]. Patient hard of hearing, but alert and oriented. Oriented patient to rehab unit. Reviewed safety and plan of care. Vital signs stable. To get respiratory treatment. Medicated for hip pain. To monitor comfort, educated patient regarding therapy assessment in am. Sherlyn Lees, RN

## 2012-02-03 NOTE — Progress Notes (Signed)
Pt discharged to inpatient rehab per MD order. Pt alert and oriented at discharge with no complaints of pain.  Pt ambulated to bedside commode prior to discharge.  Pt had 3 sutures to R eyebrow that were removed prior to discharge per MD order.   Report given to Alvino Chapel, RN. Pt transferred to [id number] via wheelchair by nurse tech. Efraim Kaufmann

## 2012-02-03 NOTE — Progress Notes (Signed)
ANTICOAGULATION CONSULT NOTE - Follow Up Consult  Pharmacy Consult for coumadin Indication: VTE prophylaxis s/p hip arthroplasty  No Known Allergies  Patient Measurements: Height: 5\' 5"  (165.1 cm) Weight: 123 lb 4.8 oz (55.929 kg) IBW/kg (Calculated) : 57    Vital Signs: Temp: 98 F (36.7 C) (11/21 0500) BP: 115/63 mmHg (11/21 0928) Pulse Rate: 92  (11/21 0928)  Labs:  Basename 02/03/12 0600 02/02/12 0618 02/01/12 1912 02/01/12 0615 01/31/12 2231 01/31/12 2230 01/31/12 1352  HGB 10.6* -- -- 10.5* -- -- --  HCT 33.0* -- -- 32.1* -- -- 27.4*  PLT 62* -- -- 43* -- -- 37*  APTT -- -- -- -- -- -- --  LABPROT 23.1* 14.8 13.3 -- -- -- --  INR 2.15* 1.18 1.02 -- -- -- --  HEPARINUNFRC -- -- -- -- -- -- --  CREATININE -- -- -- -- -- 0.46* --  CKTOTAL -- -- -- -- -- -- --  CKMB -- -- -- -- -- -- --  TROPONINI -- -- -- -- <0.30 -- --    Estimated Creatinine Clearance: 57.7 ml/min (by C-G formula based on Cr of 0.46).  Assessment: Patient is a 71 y.o F s/p fall with hip hemiarthroplasty on 11/16. (POD #5).  Patient on coumadin for VTE prophylaxis. Noted Cards note (Dr. Jacinto Halim) states no need for longterm anticoagulation unless recurrence of afib. INR up to 2.15 today (very large jump past 24 hours). Noted Rocephin changed to Cipro yesterday for UTI - Cipro will increase coumadin effects. Arixtra d/c yesterday due to low plt. HIT panel negative. Platelets remain low but up to 62 today.  Will monitor her closely for s/s bleeding.   Noted pt may go to rehab later today.  Pharmacy System- Based Med Review: Infectious Disease: Cipro D#2 (total abx D#5) for Ecoli/Kleb UTI. afeb, wbc wnl  11/17 CTX (UTI)>>11/20 11/20 Cipro>>  11/16 Urine >> Ecoli (pan S) and Kleb pneumo (pan S x R-amp  Cardiovascular: VSS. (back in NSR) hx HTN, MI; dig, dilt to po, metoprolol (on norvasc, asa, lasix, pravachol, fish oil PTA)  Nephrology: 11/18 scr 0.46 and lytes stable (on toviaz PTA)  Pulmonary:hx  emphysema, VATS 2003, COPD;  xopenex and atrovent nebs q6h; 2L [place]  Hematology / Oncology: hgb up 10.6 (s/p PRBC x 2 11/18), plt 62 - trending up (stable, was 85 on admit, 136 in July)  PTA Medication Issues:f/u resume of home meds  Best Practices: warf   Goal of Therapy:  INR 2-3 Monitor platelets by anticoagulation protocol: Yes   Plan:  1) No coumadin today 2) F/u INR 3) Will need to clarify with ortho on length of therapy of coumadin as Dr. Jacinto Halim not recommending long-term anticoagulation for afib.  Christoper Fabian, PharmD, BCPS Clinical pharmacist, pager [phone] 02/03/2012,10:01 AM

## 2012-02-03 NOTE — PMR Pre-admission (Signed)
PMR Admission Coordinator Pre-Admission Assessment  Patient: [name] [name] is an 71 y.o., female MRN: [id number] DOB: [birth year] Height: 5\' 5"  (165.1 cm) Weight: 52.617 kg (116 lb)  Insurance Information HMO:      PPO:       PCP:       IPA:       80/20: yes     OTHER:   PRIMARY: Medicare      Policy#: [id number]      Subscriber: pt  Benefits:  Phone #:       Name: [place] Date: A & B 09/12/1997     Deduct:     Out of Pocket Max: $1184.00      Life Max: none CIR: 100%      SNF: days 1-20 100% 21-100 $148.00/day Outpatient: 80%     Co-Pay: 20% Home Health: 100%      Co-Pay: 20%DME used DME: 80%     Co-Pay: 20% Providers: pt's choice SECONDARY: Medicaid [place] Access      Policy#: [id number] p      Subscriber: pt      Emergency Contact Information Contact Information    Name Relation Home Work Mobile   ,[name] & Alethia Berthold [phone]  [phone]     Current Medical History  Patient Admitting Diagnosis: Distal right femur fx, multiple rib fx's  History of Present Illness:   71 y.o. female with history of CKD, CAD, severe COPD--O2 dependent who fell while trying to sit in a chair and sustained right hip fracture and right 5th and 6th rib fractures on 01/29/12. She underwent right hip hemi on the same day by Dr. Lajoyce Corners. Is WBAT and on coumadin for DVT prophylaxis. Post op developed Atrial fibrillation with rapid response. Dr. Jacinto Halim consulted and felt that patient likely with asymptomatic PAF with current anemia contributing to arrythmia. She was transfused with 2 units PRBC. She was started on IV cardizem and coumadin recommended prevention of cardioembolic phenomena. BLE doppler negative for DVT. Treated with IV rocephin for E. Coli/Kleb UTI. She developed thrombocytopenia ?due to lovenox or arixtra.--HIT panel pending. Therapies ongoing and patient refusing SNF.      Past Medical History  Past Medical History  Diagnosis Date  . Hypertension   . Myocardial infarction   .  Shortness of breath   . Asthma   . COPD (chronic obstructive pulmonary disease)     emphysema    . Pneumonia     hx pneumonia ,chronic bronchitis   . H/O blood clots   . Chronic kidney disease     current   UTI   being treated  . GERD (gastroesophageal reflux disease)   . Headache     hx migraines  . Blood dyscrasia     hx blood clotts  . Arthritis     Family History  family history includes Cancer - Lung in her mother and Coronary artery disease in her brother, mother, and sister.  Prior Rehab/Hospitalizations:     Current Medications  Current facility-administered medications:acetaminophen (TYLENOL) suppository 650 mg, 650 mg, Rectal, Q6H PRN, Ron Parker, MD;  acetaminophen (TYLENOL) tablet 650 mg, 650 mg, Oral, Q6H PRN, Ron Parker, MD, 650 mg at 02/01/12 2032;  ciprofloxacin (CIPRO) tablet 250 mg, 250 mg, Oral, BID, Lesle Chris Black, NP, 250 mg at 02/03/12 5366;  coumadin book, , Does not apply, Once, Anh P Pham, PHARMD digoxin (LANOXIN) tablet 0.25 mg, 0.25 mg, Oral, Daily, Richarda Overlie, MD, 0.25 mg at 02/03/12 0929;  diltiazem (CARDIZEM CD) 24 hr capsule 240 mg, 240 mg, Oral, Daily, Pamella Pert, MD, 240 mg at 02/03/12 0929;  HYDROmorphone (DILAUDID) injection 0.5-1 mg, 0.5-1 mg, Intravenous, Q3H PRN, Ron Parker, MD, 1 mg at 01/30/12 0500 ipratropium (ATROVENT) nebulizer solution 0.5 mg, 0.5 mg, Nebulization, Q6H, Richarda Overlie, MD, 0.5 mg at 02/03/12 0846;  levalbuterol (XOPENEX) nebulizer solution 0.63 mg, 0.63 mg, Nebulization, Q6H, Richarda Overlie, MD, 0.63 mg at 02/03/12 0846;  metoCLOPramide (REGLAN) injection 5-10 mg, 5-10 mg, Intravenous, Q8H PRN, Nadara Mustard, MD;  metoCLOPramide (REGLAN) tablet 5-10 mg, 5-10 mg, Oral, Q8H PRN, Nadara Mustard, MD metoprolol succinate (TOPROL-XL) 24 hr tablet 25 mg, 25 mg, Oral, Daily, Richarda Overlie, MD, 25 mg at 02/03/12 0929;  morphine 4 MG/ML injection 4 mg, 4 mg, Intravenous, Q4H PRN, Trixie Dredge, PA;  nicotine (NICODERM  CQ - dosed in mg/24 hours) patch 21 mg, 21 mg, Transdermal, Daily, Ron Parker, MD, 21 mg at 02/03/12 0929;  ondansetron (ZOFRAN) injection 4 mg, 4 mg, Intravenous, Q6H PRN, Ron Parker, MD ondansetron [facility]) injection 4 mg, 4 mg, Intravenous, Q6H PRN, Nadara Mustard, MD;  ondansetron (ZOFRAN) tablet 4 mg, 4 mg, Oral, Q6H PRN, Ron Parker, MD;  ondansetron (ZOFRAN) tablet 4 mg, 4 mg, Oral, Q6H PRN, Nadara Mustard, MD;  oxyCODONE (Oxy IR/ROXICODONE) immediate release tablet 5 mg, 5 mg, Oral, Q4H PRN, Ron Parker, MD, 5 mg at 02/03/12 0847 [COMPLETED] warfarin (COUMADIN) tablet 4 mg, 4 mg, Oral, ONCE-1800, Hilario Quarry Amend, PHARMD, 4 mg at 02/02/12 1700;  warfarin (COUMADIN) video, , Does not apply, Once, Anh P Pham, PHARMD;  Warfarin - Pharmacist Dosing Inpatient, , Does not apply, q1800, Anh P Pham, PHARMD;  zolpidem (AMBIEN) tablet 5 mg, 5 mg, Oral, QHS PRN, Ron Parker, MD [DISCONTINUED] cefTRIAXone (ROCEPHIN) 1 g in dextrose 5 % 50 mL IVPB, 1 g, Intravenous, Q24H, Richarda Overlie, MD, 1 g at 02/01/12 1820;  [DISCONTINUED] fondaparinux (ARIXTRA) injection 2.5 mg, 2.5 mg, Subcutaneous, Daily, Richarda Overlie, MD, 2.5 mg at 02/02/12 1038  Patients Current Diet: Carb Control  Precautions / Restrictions Precautions Precautions: Posterior Hip;Fall Precaution Booklet Issued: Yes (comment) (posted precautions over bed  Precaution Comments: Re-educated on 3/3 posterior hip precautions. Restrictions Weight Bearing Restrictions: Yes RLE Weight Bearing: Weight bearing as tolerated   Prior Activity Level  Independent-lives alone Home Assistive Devices / Equipment Home Assistive Devices/Equipment: Environmental consultant (specify type)  Prior Functional Level Prior Function Level of Independence: Independent with assistive device(s);Needs assistance (Ambulates with RW) Driving: No Vocation: Retired Comments: Pt takes sponge baths alone however daughter in law helps with the bath  tube.  Current Functional Level Cognition  Arousal/Alertness: Awake/alert Overall Cognitive Status: Appears within functional limits for tasks assessed/performed Orientation Level: Oriented X4    Extremity Assessment (includes Sensation/Coordination)     RLE ROM/Strength/Tone: Unable to fully assess;Due to pain;Due to precautions    ADLs       Mobility  Bed Mobility: Supine to Sit;Sitting - Scoot to Edge of Bed Supine to Sit: 3: Mod assist;HOB elevated (HOB 20) Supine to Sit: Patient Percentage: 50% Sitting - Scoot to Edge of Bed: 4: Min assist;With rail    Transfers  Transfers: Sit to Stand;Stand to Sit Sit to Stand: 4: Min assist;With upper extremity assist;From bed Sit to Stand: Patient Percentage: 60% Stand to Sit: 4: Min assist;With upper extremity assist;With armrests;To chair/3-in-1 Stand to Sit: Patient Percentage: 60% Stand Pivot Transfers: 1: +2 Total assist Stand  Pivot Transfers: Patient Percentage: 50%    Ambulation / Gait / Stairs / Wheelchair Mobility  Ambulation/Gait Ambulation/Gait Assistance: 4: Min assist Ambulation/Gait: Patient Percentage: 70% Ambulation Distance (Feet): 32 Feet Assistive device: Rolling walker Ambulation/Gait Assistance Details: assist to maneuver RW; cues for larger steps Gait Pattern: Step-to pattern;Decreased stride length;Trunk flexed Stairs: No Wheelchair Mobility Wheelchair Mobility: No    Posture / Balance       Previous Home Environment Living Arrangements: Alone Lives With: Alone Available Help at Discharge:  (pt plans to go to son's home, however, daughter in law says plan is for pt to go ALF) Type of Home: House [pt's] Home Layout: One level Home Access: Level entry Bathroom Shower/Tub: Engineer, manufacturing systems: Standard Home Care Services: Yes Type of Home Care Services: Meals on wheels  Discharge Living Setting Plans for Discharge Living Setting: Other (Comment) (ALF, per daughter in law) Type of  Home at Discharge: Assisted living Care Facility Name at Discharge: TBD Discharge Home Layout: Other (Comment) Discharge Home Access: Other (comment) Do you have any problems obtaining your medications?: No  Social/Family/Support Systems Patient Roles: Parent Contact Information: pt's home: [phone] Anticipated Caregiver: Son & daughter in law looking for ALF. Anticipated Caregiver's Contact Information: [phone] & cell: [phone] Caregiver Availability: Other (Comment) Discharge Plan Discussed with Primary Caregiver: Yes Is Caregiver In Agreement with Plan?: Yes Does Caregiver/Family have Issues with Lodging/Transportation while Pt is in Rehab?: No  Goals/Additional Needs Patient/Family Goal for Rehab: Mod I-S Expected length of stay: @12  days Pt/Family Agrees to Admission and willing to participate: Yes Program Orientation Provided & Reviewed with Pt/Caregiver Including Roles  & Responsibilities: Yes  Patient Condition: This patient's condition remains as documented in the Consult dated 02/03/12, in which the Rehabilitation Physician determined and documented that the patient's condition is appropriate for intensive rehabilitative care in an inpatient rehabilitation facility.  Preadmission Screen Completed By:  Brock Ra, 02/03/2012 1:52 PM ______________________________________________________________________   Discussed status with Dr. Wynn Banker on 02/03/12 at 1:46pm and received telephone approval for admission today.  Admission Coordinator:  Brock Ra, time 1:46pm/Date 02/03/12

## 2012-02-03 NOTE — Progress Notes (Signed)
ANTICOAGULATION CONSULT NOTE - Initial Consult  Pharmacy Consult for warfarin Indication: VTE prophylaxis  No Known Allergies  Patient Measurements: Height: 5\' 5"  (165.1 cm) Weight: 118 lb 6.2 oz (53.7 kg) IBW/kg (Calculated) : 57   Vital Signs: Temp: 98 F (36.7 C) (11/21 1630) Temp src: Oral (11/21 1630) BP: 123/76 mmHg (11/21 1630) Pulse Rate: 96  (11/21 1630)  Labs:  Basename 02/03/12 0600 02/02/12 0618 02/01/12 1912 02/01/12 0615 01/31/12 2231 01/31/12 2230  HGB 10.6* -- -- 10.5* -- --  HCT 33.0* -- -- 32.1* -- --  PLT 62* -- -- 43* -- --  APTT -- -- -- -- -- --  LABPROT 23.1* 14.8 13.3 -- -- --  INR 2.15* 1.18 1.02 -- -- --  HEPARINUNFRC -- -- -- -- -- --  CREATININE -- -- -- -- -- 0.46*  CKTOTAL -- -- -- -- -- --  CKMB -- -- -- -- -- --  TROPONINI -- -- -- -- <0.30 --    Estimated Creatinine Clearance: 55.5 ml/min (by C-G formula based on Cr of 0.46).   Medical History: Past Medical History  Diagnosis Date  . Hypertension   . Myocardial infarction   . Shortness of breath   . Asthma   . COPD (chronic obstructive pulmonary disease)     emphysema    . Pneumonia     hx pneumonia ,chronic bronchitis   . H/O blood clots   . Chronic kidney disease     current   UTI   being treated  . GERD (gastroesophageal reflux disease)   . Headache     hx migraines  . Blood dyscrasia     hx blood clotts  . Arthritis     Medications:  Prescriptions prior to admission  Medication Sig Dispense Refill  . amLODipine (NORVASC) 10 MG tablet Take 10 mg by mouth daily.      Marland Kitchen aspirin 81 MG chewable tablet Chew 81 mg by mouth daily.      Marland Kitchen docusate sodium (COLACE) 100 MG capsule Take 100 mg by mouth daily as needed. For constipation      . fesoterodine (TOVIAZ) 4 MG TB24 Take 4 mg by mouth at bedtime.      . fish oil-omega-3 fatty acids 1000 MG capsule Take 1 g by mouth daily.      . furosemide (LASIX) 20 MG tablet Take 10 mg by mouth daily as needed. For leg edema      .  ibuprofen (ADVIL,MOTRIN) 200 MG tablet Take 600 mg by mouth every 6 (six) hours as needed. For pain      . ipratropium-albuterol (DUONEB) 0.5-2.5 (3) MG/3ML SOLN Take 3 mLs by nebulization every 4 (four) hours as needed. For shortness of breath or wheezing      . loperamide (IMODIUM) 2 MG capsule Take 2 mg by mouth 4 (four) times daily as needed. For diarrhea.      . meclizine (ANTIVERT) 12.5 MG tablet Take 25 mg by mouth every 4 (four) hours as needed. For dizziness      . metoprolol succinate (TOPROL-XL) 50 MG 24 hr tablet Take 25 mg by mouth daily. Take with or immediately following a meal.      . omeprazole (PRILOSEC) 20 MG capsule Take 20 mg by mouth daily.      Marland Kitchen OVER THE COUNTER MEDICATION Take 1-3 tablets by mouth at bedtime as needed. Hyland's Leg Cramps PM for sleep and leg cramps      . pravastatin (PRAVACHOL)  80 MG tablet Take 80 mg by mouth at bedtime.      . senna-docusate (SENOKOT-S) 8.6-50 MG per tablet Take 2-4 tablets by mouth daily as needed. For constipation      . traMADol (ULTRAM) 50 MG tablet Take 50-100 mg by mouth 4 (four) times daily as needed. For back pain      . vitamin B-12 (CYANOCOBALAMIN) 500 MCG tablet Take 500 mcg by mouth daily.       Assessment:  Patient is a 71 y.o F s/p fall with hip hemiarthroplasty on 11/16, transferred to [facility] 02/03/2012.  Pharmacy consulted to dose coumadin for VTE prophylaxis.  Cards note (Dr. Jacinto Halim) states no need for longterm anticoagulation unless recurrence of afib. INR up to 2.15 today (very large jump past 24 hours). Noted Rocephin changed to Cipro yesterday for UTI - Cipro will increase coumadin effects. Arixtra d/c yesterday due to low plt. HIT panel negative. Platelets remain low but up to 62 today. Will monitor her closely for s/s bleeding.    Plan:  Goal of Therapy:  INR 2-3 Monitor platelets by anticoagulation protocol: Yes   Plan:  1) No coumadin today  2) F/u INR  3) Will need to clarify with ortho on length of therapy  of coumadin as Dr. Jacinto Halim not recommending long-term anticoagulation for afib.     Thank you for allowing pharmacy to be a part of this patients care team.  Lovenia Kim Pharm.D., BCPS Clinical Pharmacist 02/03/2012 5:58 PM Pager: [phone] Phone: [phone]

## 2012-02-04 ENCOUNTER — Inpatient Hospital Stay (HOSPITAL_COMMUNITY): Payer: Medicare Other | Admitting: Occupational Therapy

## 2012-02-04 ENCOUNTER — Inpatient Hospital Stay (HOSPITAL_COMMUNITY): Payer: Medicare Other

## 2012-02-04 DIAGNOSIS — W07XXXA Fall from chair, initial encounter: Secondary | ICD-10-CM

## 2012-02-04 DIAGNOSIS — S72009A Fracture of unspecified part of neck of unspecified femur, initial encounter for closed fracture: Secondary | ICD-10-CM

## 2012-02-04 LAB — CBC WITH DIFFERENTIAL/PLATELET
Basophils Relative: 0 % (ref 0–1)
Eosinophils Absolute: 0.1 10*3/uL (ref 0.0–0.7)
Lymphs Abs: 0.8 10*3/uL (ref 0.7–4.0)
MCH: 26.4 pg (ref 26.0–34.0)
Neutrophils Relative %: 69 % (ref 43–77)
Platelets: 69 10*3/uL — ABNORMAL LOW (ref 150–400)
RBC: 4.24 MIL/uL (ref 3.87–5.11)
WBC: 5.6 10*3/uL (ref 4.0–10.5)

## 2012-02-04 LAB — COMPREHENSIVE METABOLIC PANEL
ALT: 15 U/L (ref 0–35)
AST: 19 U/L (ref 0–37)
Albumin: 2.4 g/dL — ABNORMAL LOW (ref 3.5–5.2)
Alkaline Phosphatase: 62 U/L (ref 39–117)
Glucose, Bld: 120 mg/dL — ABNORMAL HIGH (ref 70–99)
Potassium: 4 mEq/L (ref 3.5–5.1)
Sodium: 140 mEq/L (ref 135–145)
Total Protein: 6 g/dL (ref 6.0–8.3)

## 2012-02-04 MED ORDER — WARFARIN SODIUM 2 MG PO TABS
2.0000 mg | ORAL_TABLET | Freq: Once | ORAL | Status: AC
  Administered 2012-02-04: 2 mg via ORAL
  Filled 2012-02-04: qty 1

## 2012-02-04 NOTE — Evaluation (Signed)
Physical Therapy Assessment and Plan  Patient Details  Name: [name] [name] MRN: [id number] Date of Birth: [birth year]  PT Diagnosis: Abnormality of gait, Difficulty walking, Edema, Muscle weakness and Pain in joint Rehab Potential: Good ELOS: 10 days   Today's Date: 02/04/2012 Time: 0930-1030 Time Calculation (min): 60 min  Problem List:  Patient Active Problem List  Diagnosis  . Closed fracture of right distal radius  . Fracture of right hip  . Multiple rib fractures  . Fall due to stumbling  . CAD (coronary artery disease)  . Hypertension  . COPD (chronic obstructive pulmonary disease)  . CKD (chronic kidney disease)  . Osteoarthritis  . UTI (lower urinary tract infection)  . Thrombocytopenia  . Paroxysmal atrial fibrillation    Past Medical History:  Past Medical History  Diagnosis Date  . Hypertension   . Myocardial infarction   . Shortness of breath   . Asthma   . COPD (chronic obstructive pulmonary disease)     emphysema    . Pneumonia     hx pneumonia ,chronic bronchitis   . H/O blood clots   . Chronic kidney disease     current   UTI   being treated  . GERD (gastroesophageal reflux disease)   . Headache     hx migraines  . Blood dyscrasia     hx blood clotts  . Arthritis    Past Surgical History:  Past Surgical History  Procedure Date  . Right leg   . Hip fracture surgery     RIGHT SIDE  . Shoulder surgery     RIGHT  . Cholecystectomy   . Eye surgery     BIL CATARACT   . Orif wrist fracture 10/13/2011    Procedure: OPEN REDUCTION INTERNAL FIXATION (ORIF) WRIST FRACTURE;  Surgeon: Eldred Manges, MD;  Location: [facility] OR;  Service: Orthopedics;  Laterality: Right;  Open Reduction Internal Fixation Right Distal Radius   . Abdominal hysterectomy   . Btl   . Hardware removal 01/29/2012    Procedure: HARDWARE REMOVAL;  Surgeon: Nadara Mustard, MD;  Location: [facility] OR;  Service: Orthopedics;  Laterality: Right;  . Hip arthroplasty 01/29/2012    Procedure:  ARTHROPLASTY BIPOLAR HIP;  Surgeon: Nadara Mustard, MD;  Location: [facility] OR;  Service: Orthopedics;  Laterality: Right;    Assessment & Plan Clinical Impression: Patient is a 71 y.o. year old female with recent admission to the hospital with history of CKD, CAD, severe COPD--O2 dependent who fell while trying to sit in a chair and sustained right hip fracture and right 5th and 6th rib fractures on 01/29/12. She underwent right hip hemi on the same day by Dr. Lajoyce Corners. Is WBAT and on coumadin for DVT prophylaxis. Post op developed Atrial fibrillation with rapid response. Dr. Jacinto Halim consulted and felt that patient likely with asymptomatic PAF with current anemia contributing to arrythmia. She was transfused with 2 units PRBC. She was started on IV cardizem and coumadin recommended prevention of cardioembolic phenomena. BLE doppler negative for DVT. CTA chest done due to history of chronic intermittent CP and was negative for PE. Treated with IV rocephin for E. Coli/Kleb UTI. She developed thrombocytopenia ?due to lovenox or arixtra.--HIT panel negative.  Patient transferred to CIR on 02/03/2012 .   Patient currently requires min/ mod A with mobility secondary to muscle weakness, decreased cardiorespiratoy endurance and decreased oxygen support and decreased standing balance and decreased balance strategies.  Prior to hospitalization, patient was independent with mobility  and lived with Alone in a Apartment home.  Home access is  Level entry.  Patient will benefit from skilled PT intervention to maximize safe functional mobility, minimize fall risk and decrease caregiver burden for planned discharge home with intermittent assist.  Anticipate patient will benefit from follow up [facility] at discharge.  PT - End of Session Endurance Deficit: Yes Endurance Deficit Description: O2 dependent prior; poor activity tolerance PT Assessment Rehab Potential: Good Barriers to Discharge: Decreased caregiver support PT Plan PT  Frequency: 1-2 X/day, 60-90 minutes Estimated Length of Stay: 10 days PT Treatment/Interventions: Ambulation/gait training;Balance/vestibular training;Community reintegration;Discharge planning;Disease management/prevention;DME/adaptive equipment instruction;Functional mobility training;Neuromuscular re-education;Pain management;Patient/family education;Psychosocial support;Skin care/wound management;Splinting/orthotics;Stair training;Therapeutic Activities;Therapeutic Exercise;UE/LE Coordination activities;UE/LE Strength taining/ROM;Wheelchair propulsion/positioning PT Recommendation Follow Up Recommendations: Home health PT Equipment Recommended: None recommended by PT (pt owns rollator)  PT Evaluation Precautions/Restrictions Precautions Precautions: Posterior Hip;Fall Precaution Comments: patient unable to recall hip precautions, plan to continue eduation regarding precautions Restrictions Weight Bearing Restrictions: Yes RLE Weight Bearing: Weight bearing as tolerated Vital Signs Oxygen Therapy O2 Device: Nasal cannula O2 Flow Rate (L/min): 2 L/min 95-98% during therapy; required breathing treatment due to discomfort/SOB with exertion Pain Pain Assessment Pain Assessment: 0-10 Pain Score:   4 Faces Pain Scale: Hurts little more Pain Type: Surgical pain;Acute pain Pain Location: Hip Pain Orientation: Right Pain Onset: Gradual Pain Intervention(s): RN made aware Home Living/Prior Functioning Home Living Lives With: Alone Available Help at Discharge: Family (pt states her brother may stay with her a few days) Type of Home: Apartment Home Access: Level entry Home Layout: One level Bathroom Shower/Tub: Tub/shower unit;Curtain Firefighter: Standard Bathroom Accessibility: Yes How Accessible: Accessible via walker Home Adaptive Equipment: Tub transfer bench;Bedside commode/3-in-1;Grab bars in shower;Hand-held shower hose;Walker - four wheeled Prior Function Level of  Independence: Independent with basic ADLs;Independent with homemaking with ambulation;Independent with gait;Independent with transfers Driving: No Vocation: Retired Gaffer: patient retired from [employer] work Comments: patient states she normally takes sponge baths and daughter-in-law assists with tub/showers Vision/Perception  Vision - History Baseline Vision: Wears glasses all the time Patient Visual Report: No change from baseline Vision - Assessment Eye Alignment: Within Functional Limits Vision Assessment: Vision not tested Perception Perception: Within Functional Limits Praxis Praxis: Intact  Cognition Overall Cognitive Status: Appears within functional limits for tasks assessed Arousal/Alertness: Awake/alert Orientation Level: Oriented X4 Memory: Appears intact Awareness: Appears intact Problem Solving: Appears intact Safety/Judgment: Appears intact Comments: hard of hearing Sensation Sensation Light Touch: Appears Intact Additional Comments: bilateral UEs appear intact Coordination Gross Motor Movements are Fluid and Coordinated: Yes Fine Motor Movements are Fluid and Coordinated: Yes Motor  Motor Motor: Within Functional Limits (generalized weakness;)    Locomotion  Ambulation Ambulation/Gait Assistance: 4: Min assist  Trunk/Postural Assessment  Cervical Assessment Cervical Assessment: Within Functional Limits Thoracic Assessment Thoracic Assessment: Exceptions to [facility] (flexed posture) Lumbar Assessment Lumbar Assessment: Within Functional Limits  Balance Balance Balance Assessed: Yes Static Sitting Balance Static Sitting - Level of Assistance: 5: Stand by assistance Dynamic Sitting Balance Dynamic Sitting - Level of Assistance: 5: Stand by assistance Static Standing Balance Static Standing - Level of Assistance: 4: Min assist Dynamic Standing Balance Dynamic Standing - Level of Assistance: 3: Mod assist (without UE support) Extremity  Assessment  RUE Assessment RUE Assessment: Within Functional Limits (can benefit from strengthening) LUE Assessment LUE Assessment: Within Functional Limits (can benefit from strengthening) RLE Assessment RLE Assessment: Exceptions to [facility] (hip 3-/5; knee and ankle 3+ to 4-/5; dec muscular endurance) LLE Assessment LLE  Assessment: Within Functional Limits (decreased muscular endurance)  See FIM for current functional status Refer to Care Plan for Long Term Goals  Recommendations for other services: None  Discharge Criteria: Patient will be discharged from PT if patient refuses treatment 3 consecutive times without medical reason, if treatment goals not met, if there is a change in medical status, if patient makes no progress towards goals or if patient is discharged from hospital.  The above assessment, treatment plan, treatment alternatives and goals were discussed and mutually agreed upon: by patient  Individual treatment initiated with education on precautions and techniques for transfers, short distance gait (fatigues easily) using RW, toileting/dynamic balance, and orientation to rehab unit. Discussion of goals and d/c plan with pt who states her brother may stay with her a few days and her son can help out intermittently as needed.   Karolee Stamps [facility] 02/04/2012, 10:46 AM

## 2012-02-04 NOTE — Progress Notes (Signed)
Social Work  Social Work Assessment and Plan  Patient Details  Name: [name] [name] MRN: [id number] Date of Birth: [birth year]  Today's Date: 02/04/2012  Problem List:  Patient Active Problem List  Diagnosis  . Closed fracture of right distal radius  . Fracture of right hip  . Multiple rib fractures  . Fall due to stumbling  . CAD (coronary artery disease)  . Hypertension  . COPD (chronic obstructive pulmonary disease)  . CKD (chronic kidney disease)  . Osteoarthritis  . UTI (lower urinary tract infection)  . Thrombocytopenia  . Paroxysmal atrial fibrillation   Past Medical History:  Past Medical History  Diagnosis Date  . Hypertension   . Myocardial infarction   . Shortness of breath   . Asthma   . COPD (chronic obstructive pulmonary disease)     emphysema    . Pneumonia     hx pneumonia ,chronic bronchitis   . H/O blood clots   . Chronic kidney disease     current   UTI   being treated  . GERD (gastroesophageal reflux disease)   . Headache     hx migraines  . Blood dyscrasia     hx blood clotts  . Arthritis    Past Surgical History:  Past Surgical History  Procedure Date  . Right leg   . Hip fracture surgery     RIGHT SIDE  . Shoulder surgery     RIGHT  . Cholecystectomy   . Eye surgery     BIL CATARACT   . Orif wrist fracture 10/13/2011    Procedure: OPEN REDUCTION INTERNAL FIXATION (ORIF) WRIST FRACTURE;  Surgeon: Eldred Manges, MD;  Location: [facility] OR;  Service: Orthopedics;  Laterality: Right;  Open Reduction Internal Fixation Right Distal Radius   . Abdominal hysterectomy   . Btl   . Hardware removal 01/29/2012    Procedure: HARDWARE REMOVAL;  Surgeon: Nadara Mustard, MD;  Location: [facility] OR;  Service: Orthopedics;  Laterality: Right;  . Hip arthroplasty 01/29/2012    Procedure: ARTHROPLASTY BIPOLAR HIP;  Surgeon: Nadara Mustard, MD;  Location: [facility] OR;  Service: Orthopedics;  Laterality: Right;   Social History:  reports that she has been smoking  Cigarettes.  She has a 50 pack-year smoking history. She does not have any smokeless tobacco history on file. She reports that she does not drink alcohol or use illicit drugs.  Family / Support Systems Marital Status: Widow/Widower How Long?: 25 years Patient Roles: Parent Children: son (and daughter-in-law), [name] [name] (and [name] [name]) [place] living in [place]:  [name] [name]'s (C) [phone] and [name] [name] @ (C) [phone];  pt has one other son living in [place] Other Supports: Pt reports she has a brother living in [place] "...who might be able to come stay with me" Anticipated Caregiver: None Ability/Limitations of Caregiver: son and daughter-in-law are both working Caregiver Availability: Intermittent Family Dynamics: Pt describes son and daughter-in-law as supportive, however, dtr-in-law expresses MUCH frustration with pt's unwillingness to consider alternatives to living alone and states, "we may have to disown her"  Social History Preferred language: English Religion: Baptist Cultural Background: NA Education: quit in 8th grade Read: Yes Write: Yes Employment Status: Retired Date Retired/Disabled/Unemployed: age 69 Legal Hisotry/Current Legal Issues: none Guardian/Conservator: none   Abuse/Neglect Physical Abuse: Denies Verbal Abuse: Denies Sexual Abuse: Denies Exploitation of patient/patient's resources: Denies Self-Neglect: Denies  Emotional Status Pt's affect, behavior adn adjustment status: Pt very HOH so difficult to conduct interview.  Pt answers questions appropriately and becomes  insistent with her language when reporting that she is planning on discharging to her own home.  Pt denies any significant emotional distress, however, will continue to monitor. Recent Psychosocial Issues: Has had several falls over past few months which has been a cause for disagreement between pt and family about her ability to remain in her apartment alone and safely. Pyschiatric History: None Substance Abuse  History: None  Patient / Family Perceptions, Expectations & Goals Pt/Family understanding of illness & functional limitations: Pt able to offer basic description of her fxs and limitations, however, does not feel this will prevent her from being able to return home alone. Premorbid pt/family roles/activities: Pt living alone PTA and, per her, was independent with all activities Anticipated changes in roles/activities/participation: Team may recommend that pt have 24/7 assistance, therefore, family will need to assume caregiver roles if pt allows. Pt/family expectations/goals: "I am going back to [employer] apartment"  [employer]: None Premorbid Home Care/DME Agencies: None Transportation available at discharge: yes  Discharge Planning Living Arrangements: Alone Support Systems: Children Type of Residence: Private residence Insurance Resources: Medicare;Medicaid (specify county) Software engineer co.) Financial Resources: Restaurant manager, fast food Screen Referred: No Living Expenses: Psychologist, sport and exercise Management: Patient Do you have any problems obtaining your medications?: No Home Management: ot Patient/Family Preliminary Plans: Pt plans to return to her own home, however, family feels she must agree to go into some supported living environment.  Will be discussing further with team, pt and family Barriers to Discharge: Family Support Social Work Anticipated Follow Up Needs: ALF/IL Expected length of stay: @12  days  Clinical Impression Frail appearing woman here after fall at home with hip fx.  Only one local son and daughter-in-law who express frustration that pt continues to stay in her apt, despite their attempts to get her into a supported living environment.  Will need further discussing with pt, team and family in regards to d/c recommendations.  [name] [name] 02/04/2012, 3:21 PM

## 2012-02-04 NOTE — Progress Notes (Signed)
Physical Therapy Session Note  Patient Details  Name: [name] [name] MRN: [id number] Date of Birth: 05-09-1940  Today's Date: 02/04/2012 Time: 1530-1600 Time Calculation (min): 30 min  Short Term Goals: Week 1:  PT Short Term Goal 1 (Week 1): = LTGs  Skilled Therapeutic Interventions/Progress Updates:    Pt declined OOB due to fatigue and pain but agreeable to do therex and therapy in the room. Introduced HEP for RLE strengthening and ROM for ankle pumps, heel slides, hip abduction, SAQs and LAQs x 10 reps each. Bed mobility supine <-> sit with min/mod A and sit to stands with steady A.  Therapy Documentation Precautions:  Precautions Precautions: Posterior Hip;Fall Precaution Comments: patient unable to recall hip precautions, plan to continue eduation regarding precautions Restrictions Weight Bearing Restrictions: Yes RLE Weight Bearing: Weight bearing as tolerated   Pain: C/o 3/10 pain in R hip - notified RN for medication.  See FIM for current functional status  Therapy/Group: Individual Therapy  Karolee Stamps [facility] 02/04/2012, 4:15 PM

## 2012-02-04 NOTE — Progress Notes (Addendum)
ANTICOAGULATION CONSULT NOTE   Pharmacy Consult for warfarin Indication: VTE prophylaxis  No Known Allergies  Patient Measurements: Height: 5\' 5"  (165.1 cm) Weight: 118 lb 6.2 oz (53.7 kg) IBW/kg (Calculated) : 57   Vital Signs: Temp: 97.6 F (36.4 C) (11/22 0527) Temp src: Oral (11/22 0527) BP: 131/73 mmHg (11/22 0527) Pulse Rate: 92  (11/22 0527)  Labs:  Basename 02/04/12 0610 02/03/12 0600 02/02/12 0618  HGB 11.2* 10.6* --  HCT 35.4* 33.0* --  PLT 69* 62* --  APTT -- -- --  LABPROT 19.7* 23.1* 14.8  INR 1.73* 2.15* 1.18  HEPARINUNFRC -- -- --  CREATININE 0.44* -- --  CKTOTAL -- -- --  CKMB -- -- --  TROPONINI -- -- --    Estimated Creatinine Clearance: 55.5 ml/min (by C-G formula based on Cr of 0.44).   Medical History: Past Medical History  Diagnosis Date  . Hypertension   . Myocardial infarction   . Shortness of breath   . Asthma   . COPD (chronic obstructive pulmonary disease)     emphysema    . Pneumonia     hx pneumonia ,chronic bronchitis   . H/O blood clots   . Chronic kidney disease     current   UTI   being treated  . GERD (gastroesophageal reflux disease)   . Headache     hx migraines  . Blood dyscrasia     hx blood clotts  . Arthritis     Medications:  Prescriptions prior to admission  Medication Sig Dispense Refill  . amLODipine (NORVASC) 10 MG tablet Take 10 mg by mouth daily.      Marland Kitchen aspirin 81 MG chewable tablet Chew 81 mg by mouth daily.      Marland Kitchen docusate sodium (COLACE) 100 MG capsule Take 100 mg by mouth daily as needed. For constipation      . fesoterodine (TOVIAZ) 4 MG TB24 Take 4 mg by mouth at bedtime.      . fish oil-omega-3 fatty acids 1000 MG capsule Take 1 g by mouth daily.      . furosemide (LASIX) 20 MG tablet Take 10 mg by mouth daily as needed. For leg edema      . ibuprofen (ADVIL,MOTRIN) 200 MG tablet Take 600 mg by mouth every 6 (six) hours as needed. For pain      . ipratropium-albuterol (DUONEB) 0.5-2.5 (3)  MG/3ML SOLN Take 3 mLs by nebulization every 4 (four) hours as needed. For shortness of breath or wheezing      . loperamide (IMODIUM) 2 MG capsule Take 2 mg by mouth 4 (four) times daily as needed. For diarrhea.      . meclizine (ANTIVERT) 12.5 MG tablet Take 25 mg by mouth every 4 (four) hours as needed. For dizziness      . metoprolol succinate (TOPROL-XL) 50 MG 24 hr tablet Take 25 mg by mouth daily. Take with or immediately following a meal.      . omeprazole (PRILOSEC) 20 MG capsule Take 20 mg by mouth daily.      Marland Kitchen OVER THE COUNTER MEDICATION Take 1-3 tablets by mouth at bedtime as needed. Hyland's Leg Cramps PM for sleep and leg cramps      . pravastatin (PRAVACHOL) 80 MG tablet Take 80 mg by mouth at bedtime.      . senna-docusate (SENOKOT-S) 8.6-50 MG per tablet Take 2-4 tablets by mouth daily as needed. For constipation      . traMADol (ULTRAM) 50  MG tablet Take 50-100 mg by mouth 4 (four) times daily as needed. For back pain      . vitamin B-12 (CYANOCOBALAMIN) 500 MCG tablet Take 500 mcg by mouth daily.       Assessment:  Patient is a 71 y.o F s/p fall with hip hemiarthroplasty on 11/16, transferred to [facility] 02/03/2012.  Pharmacy consulted to dose coumadin for VTE prophylaxis.  Cards note (Dr. Jacinto Halim) states no need for longterm anticoagulation unless recurrence of afib. INR 1.73 today. Noted Rocephin changed to Cipro for UTI - Cipro will increase coumadin effects. Arixtra d/c due to low plt. HIT panel negative. Platelets remain low but up to 69 today. Will monitor her closely for s/s bleeding.    Plan:  Goal of Therapy:  INR 2-3 Monitor platelets by anticoagulation protocol: Yes   Plan:  1) Coumadin 2 mg today  2) F/u INR    Thank you for allowing pharmacy to be a part of this patients care team.  Rushie Chestnut.D. Clinical Pharmacist 02/04/2012 10:26 AM Pager: [phone] Phone: [phone]

## 2012-02-04 NOTE — Evaluation (Signed)
Occupational Therapy Assessment and Plan & Session Notes  Patient Details  Name: [name] [name] MRN: [id number] Date of Birth: 06-04-1940  OT Diagnosis: acute pain and muscle weakness (generalized) Rehab Potential: Rehab Potential: Good ELOS: ~10 days   Today's Date: 02/04/2012  ASSESSMENT AND PLAN  Problem List:  Patient Active Problem List  Diagnosis  . Closed fracture of right distal radius  . Fracture of right hip  . Multiple rib fractures  . Fall due to stumbling  . CAD (coronary artery disease)  . Hypertension  . COPD (chronic obstructive pulmonary disease)  . CKD (chronic kidney disease)  . Osteoarthritis  . UTI (lower urinary tract infection)  . Thrombocytopenia  . Paroxysmal atrial fibrillation    Past Medical History:  Past Medical History  Diagnosis Date  . Hypertension   . Myocardial infarction   . Shortness of breath   . Asthma   . COPD (chronic obstructive pulmonary disease)     emphysema    . Pneumonia     hx pneumonia ,chronic bronchitis   . H/O blood clots   . Chronic kidney disease     current   UTI   being treated  . GERD (gastroesophageal reflux disease)   . Headache     hx migraines  . Blood dyscrasia     hx blood clotts  . Arthritis    Past Surgical History:  Past Surgical History  Procedure Date  . Right leg   . Hip fracture surgery     RIGHT SIDE  . Shoulder surgery     RIGHT  . Cholecystectomy   . Eye surgery     BIL CATARACT   . Orif wrist fracture 10/13/2011    Procedure: OPEN REDUCTION INTERNAL FIXATION (ORIF) WRIST FRACTURE;  Surgeon: Eldred Manges, MD;  Location: [facility] OR;  Service: Orthopedics;  Laterality: Right;  Open Reduction Internal Fixation Right Distal Radius   . Abdominal hysterectomy   . Btl   . Hardware removal 01/29/2012    Procedure: HARDWARE REMOVAL;  Surgeon: Nadara Mustard, MD;  Location: [facility] OR;  Service: Orthopedics;  Laterality: Right;  . Hip arthroplasty 01/29/2012    Procedure: ARTHROPLASTY BIPOLAR HIP;   Surgeon: Nadara Mustard, MD;  Location: [facility] OR;  Service: Orthopedics;  Laterality: Right;    Clinical Impression:[name] [name] is a 71 y.o. female with history of CKD, CAD, severe COPD--O2 dependent who fell while trying to sit in a chair and sustained right hip fracture and right 5th and 6th rib fractures on 01/29/12. She underwent right hip hemi on the same day by Dr. Lajoyce Corners. Is WBAT and on coumadin for DVT prophylaxis. Post op developed Atrial fibrillation with rapid response. Dr. Jacinto Halim consulted and felt that patient likely with asymptomatic PAF with current anemia contributing to arrythmia. She was transfused with 2 units PRBC. She was started on IV cardizem and coumadin recommended prevention of cardioembolic phenomena. BLE doppler negative for DVT. CTA chest done due to history of chronic intermittent CP and was negative for PE. Treated with IV rocephin for E. Coli/Kleb UTI. She developed thrombocytopenia ?due to lovenox or arixtra.--HIT panel negative. Therapies ongoing and patient refusing SNF. MD recommending CIR for progression. Patient transferred to CIR on 02/03/2012 .    Patient currently requires min-total assist with basic self-care skills and IADL secondary to muscle weakness and muscle joint tightness, decreased cardiorespiratoy endurance and decreased oxygen support, decreased coordination and decreased sitting balance, decreased standing balance, decreased postural control, decreased  balance strategies and difficulty maintaining precautions.  Prior to hospitalization, patient could complete ADLs and IADLs independently.   Patient will benefit from skilled intervention to increase independence with basic self-care skills prior to discharge home independently.  Anticipate patient will require intermittent supervision and additional occupational therapy is TBD.  OT - End of Session Activity Tolerance: Tolerates 10 - 20 min activity with multiple rests Endurance Deficit: Yes Endurance  Deficit Description: O2 dependent prior; poor activity tolerance OT Assessment Rehab Potential: Good Barriers to Discharge: Decreased caregiver support Barriers to Discharge Comments: patient's son and daughter are available intermittently OT Plan OT Frequency: 1-2 X/day, 60-90 minutes Estimated Length of Stay: ~10 days OT Treatment/Interventions: Balance/vestibular training;Community reintegration;Discharge planning;DME/adaptive equipment instruction;Functional mobility training;Neuromuscular re-education;Pain management;Patient/family education;Psychosocial support;Self Care/advanced ADL retraining;Skin care/wound managment;Splinting/orthotics;Therapeutic Activities;Therapeutic Exercise;UE/LE Strength taining/ROM;UE/LE Coordination activities;Wheelchair propulsion/positioning OT Recommendation Follow Up Recommendations:  (additional occupational therapy is TBD) Equipment Recommended: None recommended by PT (pt owns rollator) Equipment Details: none for OT, per patient she has a BSC and tub transfer bench  Precautions/Restrictions  Precautions Precautions: Posterior Hip;Fall Precaution Comments: patient unable to recall hip precautions, plan to continue eduation regarding precautions Restrictions Weight Bearing Restrictions: Yes RLE Weight Bearing: Weight bearing as tolerated  General Chart Reviewed: Yes Family/Caregiver Present: No  Pain Pain Assessment Pain Assessment: 0-10 Pain Score:   4 Faces Pain Scale: Hurts little more Pain Type: Surgical pain;Acute pain Pain Location: Hip Pain Orientation: Right Pain Onset: Gradual Pain Intervention(s): RN made aware  Home Living/Prior Functioning Home Living Lives With: Alone Available Help at Discharge: Family (pt states her brother may stay with her a few days) Type of Home: Apartment Home Access: Level entry Home Layout: One level Bathroom Shower/Tub: Tub/shower unit;Curtain Firefighter: Standard Bathroom Accessibility:  Yes How Accessible: Accessible via walker Home Adaptive Equipment: Tub transfer bench;Bedside commode/3-in-1;Grab bars in shower;Hand-held shower hose;Walker - four wheeled IADL History Homemaking Responsibilities: Yes Meal Prep Responsibility: Primary Laundry Responsibility: Primary Cleaning Responsibility: Primary Bill Paying/Finance Responsibility: Primary Shopping Responsibility: Secondary Child Care Responsibility: No Current License: No Mode of Transportation: Family (son and daughter in Social worker) Prior Function Level of Independence: Independent with basic ADLs;Independent with homemaking with ambulation;Independent with gait;Independent with transfers Driving: No Vocation: Retired Gaffer: patient retired from [employer] work Comments: patient states she normally takes sponge baths and daughter-in-law assists with tub/showers  ADL - See FIM  Vision/Perception  Vision - History Baseline Vision: Wears glasses all the time Patient Visual Report: No change from baseline Vision - Assessment Eye Alignment: Within Functional Limits Vision Assessment: Vision not tested Perception Perception: Within Functional Limits Praxis Praxis: Intact   Cognition Overall Cognitive Status: Appears within functional limits for tasks assessed Arousal/Alertness: Awake/alert Orientation Level: Oriented X4 Memory: Appears intact Awareness: Appears intact Problem Solving: Appears intact Safety/Judgment: Appears intact Comments: hard of hearing  Sensation Sensation Light Touch: Appears Intact Additional Comments: bilateral UEs appear intact Coordination Gross Motor Movements are Fluid and Coordinated: Yes Fine Motor Movements are Fluid and Coordinated: Yes  Motor  Motor Motor: Within Functional Limits (generalized weakness;)  Trunk/Postural Assessment  Cervical Assessment Cervical Assessment: Within Functional Limits Thoracic Assessment Thoracic Assessment: Exceptions to [facility]  (flexed posture) Lumbar Assessment Lumbar Assessment: Within Functional Limits   Balance Balance Balance Assessed: Yes Static Sitting Balance Static Sitting - Level of Assistance: 5: Stand by assistance Dynamic Sitting Balance Dynamic Sitting - Level of Assistance: 5: Stand by assistance Static Standing Balance Static Standing - Level of Assistance: 4: Min assist Dynamic Standing Balance  Dynamic Standing - Level of Assistance: 3: Mod assist (without UE support)  Extremity/Trunk Assessment RUE Assessment RUE Assessment: Within Functional Limits (can benefit from strengthening) LUE Assessment LUE Assessment: Within Functional Limits (can benefit from strengthening)  See FIM for current functional status  Refer to Care Plan for Long Term Goals  Recommendations for other services: None  Discharge Criteria: Patient will be discharged from OT if patient refuses treatment 3 consecutive times without medical reason, if treatment goals not met, if there is a change in medical status, if patient makes no progress towards goals or if patient is discharged from hospital.  The above assessment, treatment plan, treatment alternatives and goals were discussed and mutually agreed upon: by patient  ----------------------------------------------------------------------------------------------------------------------------  SESSION NOTES  Session #1 [phone] - 55 Minutes Individual Therapy Patient with 4/10 complaints of pain, RN made aware Initial 1:1 occupational therapy evaluation completed. Focused skilled intervention on bed mobility, RLE management, pain management, UB/LB bathing, dynamic sitting balance edge of bed, sit/stand, stand pivot transfer onto bed side chair without AD, overall activity tolerance/endurance, and pursed lip breathing prn. At end of session left patient seated in bed side chair with call bell & phone within reach. Patient remained on 2 liters of 02 throughout  session.   Session #2 [id number] - 45 Minutes Individual Therapy No complaints of pain Patient found supine in bed. Engaged in bed mobility for sit-> stand from edge of bed -> functional ambulation using rolling walker -> w/c set-up close to hallway. Patient then propelled self from room -> ADL apartment with min assist and moderate verbal cues to propel equally with BUEs. Then focused on functional mobility using rolling walker in hallway and ADL apartment. Also introduced hip kit (reacher, sock aid, long handled sponge, and long handled shoe horn); educated patient on hip kit, demonstrated usage of hip kit, and had patient return demonstrate functional use of hip kit. Recommend hip kit to help increase patients BADL independence. Patient propelled self back to room and therapist left patient seated in w/c with call bell & phone within reach.   Yaiden Yang 02/04/2012, 11:18 AM

## 2012-02-04 NOTE — Progress Notes (Signed)
Subjective/Complaints: Complains of urinary incontinence and increased urgency and frequency  Objective: Vital Signs: Blood pressure 131/73, pulse 92, temperature 97.6 F (36.4 C), temperature source Oral, resp. rate 17, height 5\' 5"  (1.651 m), weight 53.7 kg (118 lb 6.2 oz), SpO2 100.00%. No results found.  Basename 02/04/12 0610 02/03/12 0600  WBC 5.6 5.5  HGB 11.2* 10.6*  HCT 35.4* 33.0*  PLT 69* 62*    Basename 02/04/12 0610  NA 140  K 4.0  CL 104  CO2 30  GLUCOSE 120*  BUN 11  CREATININE 0.44*  CALCIUM 9.0   CBG (last 3)  No results found for this basename: GLUCAP:3 in the last 72 hours  Wt Readings from Last 3 Encounters:  02/03/12 53.7 kg (118 lb 6.2 oz)  02/03/12 52.617 kg (116 lb)  02/03/12 52.617 kg (116 lb)    Physical Exam:  Nursing note and vitals reviewed.  Constitutional: She is oriented to person, place, and time.  Frail appearing female,    HENT:  Head: Normocephalic and atraumatic.  Eyes: Pupils are equal, round, and reactive to light.  Neck: Normal range of motion. Neck supple.  Cardiovascular: Normal rate and regular rhythm.  Abdominal: Soft. Bowel sounds are normal.  Neurological: She is alert and oriented to person, place, and time.  Motor strength is 5/5 in bilateral deltoid, biceps, triceps, grip  5/5 in left hip flexor knee extensor ankle dorsiflexor and plantar flexor  Right lower extremity 3/5 in the hip flexor, 3 minus knee extensor, 4+ in the ankle dorsiflexor and plantar flexor  Sensation is intact to light touch in both upper and lower extremities  Skin: right lateral hip incision staples intact no evidence of drainage dried blood, left lateral knee incision clean dry intact no evidence of drainage or erythema      Assessment/Plan: 1. Functional deficits secondary to proximal right femur fx, rib fx's which require 3+ hours per day of interdisciplinary therapy in a comprehensive inpatient rehab setting. Physiatrist is providing  close team supervision and 24 hour management of active medical problems listed below. Physiatrist and rehab team continue to assess barriers to discharge/monitor patient progress toward functional and medical goals. FIM:                   Comprehension Comprehension Mode: Auditory Comprehension: 4-Understands basic 75 - 89% of the time/requires cueing 10 - 24% of the time  Expression Expression Mode: Verbal Expression: 4-Expresses basic 75 - 89% of the time/requires cueing 10 - 24% of the time. Needs helper to occlude trach/needs to repeat words.  Social Interaction Social Interaction: 4-Interacts appropriately 75 - 89% of the time - Needs redirection for appropriate language or to initiate interaction.  Problem Solving Problem Solving: 4-Solves basic 75 - 89% of the time/requires cueing 10 - 24% of the time  Memory Memory: 4-Recognizes or recalls 75 - 89% of the time/requires cueing 10 - 24% of the time  Medical Problem List and Plan:  1. DVT Prophylaxis/Anticoagulation: Pharmaceutical: Coumadin  2. Pain Management: continue prn oxycodone.  3. Mood: Motivated to go home. Will have LCSW follow up for formal evaluation.  4. Neuropsych: This patient is capable of making decisions on his/her own behalf.  5. PAF: Monitor HR with bid checks. Continue digoxin and Cardizem for rate control. On coumadin to prevent cardioembolic phenomena.  6. E coli/ Klebs pneumo UTI: continue cipro. Antibiotic D#5/7  7. ABLA: Improved past transfusion. Recheck in am. Add iron supplement  8. GERD: Reporting increased  symptoms off prilosec. Resume PPI.  9. Hyperactive bladder: complete antibiotic regimen. Gala Murdoch resumed  -need to  Check pvr's.  -timed voids  LOS (Days) 1 A FACE TO FACE EVALUATION WAS PERFORMED  Katherine Tout T 02/04/2012, 7:46 AM

## 2012-02-04 NOTE — Plan of Care (Signed)
Overall Plan of Care [facility]) Patient Details Name: [name] [name] MRN: [id number] DOB: 11/22/1940  Diagnosis:  Proximal femur fx  Primary Diagnosis:    Fracture of right hip Co-morbidities: gerd, htn, MI, copd  Functional Problem List  Patient demonstrates impairments in the following areas: Balance, Bladder, Bowel, Edema, Endurance, Medication Management, Pain and Skin Integrity  Basic ADL's: grooming, bathing, dressing and toileting Advanced ADL's: simple meal preparation and light housekeeping  Transfers:  bed mobility, bed to chair, toilet, tub/shower, car and furniture Locomotion:  ambulation and stairs  Additional Impairments:  Leisure Awareness  Anticipated Outcomes Item Anticipated Outcome  Eating/Swallowing    Basic self-care  Mod I  Tolieting  Mod I  Bowel/Bladder  Cont of bowel and bladder  Transfers  Mod I; S for car transfer  Locomotion  Mod I household gait  Communication    Cognition    Pain  Less or equal to 3  Safety/Judgment  Free of fall  Other     Therapy Plan: PT Frequency: 1-2 X/day, 60-90 minutes OT Frequency: 1-2 X/day, 60-90 minutes     Team Interventions: Item RN PT OT SLP SW TR Other  Self Care/Advanced ADL Retraining  x x      Neuromuscular Re-Education  x x      Therapeutic Activities  x x      UE/LE Strength Training/ROM  x x      UE/LE Coordination Activities  x x      Visual/Perceptual Remediation/Compensation         DME/Adaptive Equipment Instruction  x x      Therapeutic Exercise  x x      Balance/Vestibular Training  x x      Patient/Family Education x x x      Cognitive Remediation/Compensation         Functional Mobility Training  x x      Ambulation/Gait Training  x x      Stair Training  x x      Wheelchair Propulsion/Positioning  x x      Functional Tourist information centre manager Reintegration  x x      Dysphagia/Aspiration Film/video editor         Bladder  Management x        Bowel Management x        Disease Management/Prevention         Pain Management  x x      Medication Management x        Skin Care/Wound Management  x x      Splinting/Orthotics  x x      Discharge Planning x x x      Psychosocial Support x x x                         Team Discharge Planning: Destination:  Home Projected Follow-up:  PT, Home Health and OT: TBD Projected Equipment Needs:  None for OT; none for PT at this time Patient/family involved in discharge planning:  Yes  MD ELOS: 10 days Medical Rehab Prognosis:  Excellent Assessment: Pt admitted for CIR therapies. The team will be addressing self-care, stamina, pain mgt, balance, adaptive techniques and equipment, fxnl mobility. Goals are supervision to mod I

## 2012-02-05 ENCOUNTER — Inpatient Hospital Stay (HOSPITAL_COMMUNITY): Payer: Medicare Other | Admitting: Physical Therapy

## 2012-02-05 ENCOUNTER — Inpatient Hospital Stay (HOSPITAL_COMMUNITY): Payer: Medicare Other | Admitting: Occupational Therapy

## 2012-02-05 LAB — PROTIME-INR: INR: 2.04 — ABNORMAL HIGH (ref 0.00–1.49)

## 2012-02-05 MED ORDER — WARFARIN SODIUM 2 MG PO TABS
2.0000 mg | ORAL_TABLET | Freq: Once | ORAL | Status: AC
  Administered 2012-02-05: 2 mg via ORAL
  Filled 2012-02-05: qty 1

## 2012-02-05 NOTE — Progress Notes (Signed)
Subjective/Complaints: No new complaints. Still some urinary urgency  Objective: Vital Signs: Blood pressure 115/47, pulse 79, temperature 98.4 F (36.9 C), temperature source Oral, resp. rate 17, height 5\' 5"  (1.651 m), weight 53.7 kg (118 lb 6.2 oz), SpO2 98.00%. No results found.  Basename 02/04/12 0610 02/03/12 0600  WBC 5.6 5.5  HGB 11.2* 10.6*  HCT 35.4* 33.0*  PLT 69* 62*    Basename 02/04/12 0610  NA 140  K 4.0  CL 104  CO2 30  GLUCOSE 120*  BUN 11  CREATININE 0.44*  CALCIUM 9.0   CBG (last 3)  No results found for this basename: GLUCAP:3 in the last 72 hours  Wt Readings from Last 3 Encounters:  02/03/12 53.7 kg (118 lb 6.2 oz)  02/03/12 52.617 kg (116 lb)  02/03/12 52.617 kg (116 lb)    Physical Exam:  Nursing note and vitals reviewed.  Constitutional: She is oriented to person, place, and time.  Frail appearing female,    HENT:  Head: Normocephalic and atraumatic.  Eyes: Pupils are equal, round, and reactive to light.  Neck: Normal range of motion. Neck supple.  Cardiovascular: Normal rate and regular rhythm.  Abdominal: Soft. Bowel sounds are normal.  Neurological: She is alert and oriented to person, place, and time.  Motor strength is 5/5 in bilateral deltoid, biceps, triceps, grip  5/5 in left hip flexor knee extensor ankle dorsiflexor and plantar flexor  Right lower extremity 3/5 in the hip flexor, 3 minus knee extensor, 4+ in the ankle dorsiflexor and plantar flexor  Sensation is intact to light touch in both upper and lower extremities  Skin: right lateral hip incision staples intact no evidence of drainage dried blood, left lateral knee incision clean dry intact no evidence of drainage or erythema      Assessment/Plan: 1. Functional deficits secondary to proximal right femur fx, rib fx's which require 3+ hours per day of interdisciplinary therapy in a comprehensive inpatient rehab setting. Physiatrist is providing close team supervision and  24 hour management of active medical problems listed below. Physiatrist and rehab team continue to assess barriers to discharge/monitor patient progress toward functional and medical goals. FIM: FIM - Bathing Bathing Steps Patient Completed: Chest;Right Arm;Left Arm;Abdomen;Right upper leg;Left upper leg Bathing: 3: Mod-Patient completes 5-7 70f 10 parts or 50-74%  FIM - Upper Body Dressing/Undressing Upper body dressing/undressing: 0: Wears gown/pajamas-no public clothing FIM - Lower Body Dressing/Undressing Lower body dressing/undressing: 1: Total-Patient completed less than 25% of tasks (total assist for socks, no public clothing available)  FIM - Toileting Toileting: 1: Total-Patient completed zero steps, helper did all 3  FIM - Diplomatic Services operational officer Devices: Art gallery manager Transfers: 3-To toilet/BSC: Mod A (lift or lower assist);3-From toilet/BSC: Mod A (lift or lower assist)  FIM - Bed/Chair Transfer Bed/Chair Transfer Assistive Devices: HOB elevated Bed/Chair Transfer: 3: Sit > Supine: Mod A (lifting assist/Pt. 50-74%/lift 2 legs);3: Chair or W/C > Bed: Mod A (lift or lower assist)  FIM - Locomotion: Wheelchair Locomotion: Wheelchair: 1: Total Assistance/staff pushes wheelchair (Pt<25%) FIM - Locomotion: Ambulation Locomotion: Ambulation Assistive Devices: Designer, industrial/product Ambulation/Gait Assistance: 4: Min assist Locomotion: Ambulation: 1: Travels less than 50 ft with minimal assistance (Pt.>75%)  Comprehension Comprehension Mode: Auditory Comprehension: 5-Understands basic 90% of the time/requires cueing < 10% of the time  Expression Expression Mode: Verbal Expression: 5-Expresses basic 90% of the time/requires cueing < 10% of the time.  Social Interaction Social Interaction: 5-Interacts appropriately 90% of the time - Needs  monitoring or encouragement for participation or interaction.  Problem Solving Problem Solving: 5-Solves basic 90% of the  time/requires cueing < 10% of the time  Memory Memory: 5-Recognizes or recalls 90% of the time/requires cueing < 10% of the time  Medical Problem List and Plan:  1. DVT Prophylaxis/Anticoagulation: Pharmaceutical: Coumadin  2. Pain Management: continue prn oxycodone.  3. Mood: Motivated to go home. In good spirits.  4. Neuropsych: This patient is capable of making decisions on his/her own behalf.  5. PAF: Monitor HR with bid checks. Continue digoxin and Cardizem for rate control. On coumadin to prevent cardioembolic phenomena.  6. E coli/ Klebs pneumo UTI: continue cipro.    7. ABLA: Improved past transfusion. Recheck in am. Added iron supplement  8. GERD: Reporting increased symptoms off prilosec. Resume PPI.  9. Hyperactive bladder: complete antibiotic regimen. Gala Murdoch resumed  -pvr's low  -timed voids  LOS (Days) 2 A FACE TO FACE EVALUATION WAS PERFORMED  SWARTZ,ZACHARY T 02/05/2012, 7:55 AM

## 2012-02-05 NOTE — Progress Notes (Signed)
Physical Therapy Session Note  Patient Details  Name: [name] [name] MRN: [id number] Date of Birth: 05-16-1940  Today's Date: 02/05/2012 Time: 0830-0930 Time Calculation (min): 60 min  Short Term Goals: Week 1:  PT Short Term Goal 1 (Week 1): = LTGs  Therapy Documentation Precautions:  Precautions: Posterior Hip;Fall Precaution Comments: patient unable to recall hip precautions, plan to continue eduation regarding precautions Weight Bearing Restrictions: Yes RLE Weight Bearing: Weight bearing as tolerated Vital Signs: Therapy Vitals Temp: 98.4 F (36.9 C) Temp src: Oral Pulse Rate: 79  Resp: 17  BP: 115/47 mmHg Patient Position, if appropriate: Lying Oxygen Therapy SpO2: 98 % O2 Device: Nasal cannula O2 Flow Rate (L/min): 2 L/min Pain: Pain Assessment Pain Assessment: 0-10 Pain Score:   4 Pain Type: Surgical pain Pain Location: Hip Pain Orientation: Right Pain Intervention(s): Medication (See eMAR)  Therapeutic Exercise:(30') B LE's in supine and in sitting Therapeutic Activity:(15') Bed Mobility min-A with R LE protection for precautions while exiting bed on Left side (as she does at home)                                          Transfers sit<->stand with min-A for weight shifting and standing from standard level surface and S/Mod-I from elevated surface. Sitting into w/c needing verbal cues to reach back for armrest. Gait training:(15') using RW 3 x 80' with 2L O2 via [place]. Patient breathing through nose and with some DOE at conclusion of walking distance.  Denies light headedness.  Patient HOH with hearing aides in and needing frequent repetitions for commands.   Therapy/Group: Individual Therapy  Rex Kras 02/05/2012, 8:37 AM

## 2012-02-05 NOTE — Progress Notes (Signed)
Social Work Patient ID: [name] [name], female   DOB: [birth year], 71 y.o.   MRN: [id number]  Met with patient, son and daughter-in-law today to discuss d/c plans.  During conversations with all yesterday, pt was insistent that she wanted to d/c to her own home, however, family reports they would like her to d/c to an ALF.  Today son reports that he thought her stay here on CIR would be 6-8 weeks and that the ALF they were considering could not accomadate pt for several weeks.  Discussed with them the option of going to SNF while they awaited placement at ALF.  All, including pt, are agreeable with this plan, therefore, d/c plan is now SNF.  Will complete FL2 and go ahead and send to area facilities in hopes that we might get SNF bed by first of the week.  Ladanian Kelter

## 2012-02-05 NOTE — Progress Notes (Signed)
ANTICOAGULATION CONSULT NOTE   Pharmacy Consult for warfarin Indication: VTE prophylaxis  No Known Allergies  Patient Measurements: Height: 5\' 5"  (165.1 cm) Weight: 118 lb 6.2 oz (53.7 kg) IBW/kg (Calculated) : 57   Vital Signs: Temp: 98.4 F (36.9 C) (11/23 0601) Temp src: Oral (11/23 0601) BP: 115/47 mmHg (11/23 0601) Pulse Rate: 79  (11/23 0601)  Labs:  Basename 02/05/12 0438 02/04/12 0610 02/03/12 0600  HGB -- 11.2* 10.6*  HCT -- 35.4* 33.0*  PLT -- 69* 62*  APTT -- -- --  LABPROT 22.2* 19.7* 23.1*  INR 2.04* 1.73* 2.15*  HEPARINUNFRC -- -- --  CREATININE -- 0.44* --  CKTOTAL -- -- --  CKMB -- -- --  TROPONINI -- -- --    Estimated Creatinine Clearance: 55.5 ml/min (by C-G formula based on Cr of 0.44).   Medical History: Past Medical History  Diagnosis Date  . Hypertension   . Myocardial infarction   . Shortness of breath   . Asthma   . COPD (chronic obstructive pulmonary disease)     emphysema    . Pneumonia     hx pneumonia ,chronic bronchitis   . H/O blood clots   . Chronic kidney disease     current   UTI   being treated  . GERD (gastroesophageal reflux disease)   . Headache     hx migraines  . Blood dyscrasia     hx blood clotts  . Arthritis     Medications:  Prescriptions prior to admission  Medication Sig Dispense Refill  . amLODipine (NORVASC) 10 MG tablet Take 10 mg by mouth daily.      Marland Kitchen aspirin 81 MG chewable tablet Chew 81 mg by mouth daily.      Marland Kitchen docusate sodium (COLACE) 100 MG capsule Take 100 mg by mouth daily as needed. For constipation      . fesoterodine (TOVIAZ) 4 MG TB24 Take 4 mg by mouth at bedtime.      . fish oil-omega-3 fatty acids 1000 MG capsule Take 1 g by mouth daily.      . furosemide (LASIX) 20 MG tablet Take 10 mg by mouth daily as needed. For leg edema      . ibuprofen (ADVIL,MOTRIN) 200 MG tablet Take 600 mg by mouth every 6 (six) hours as needed. For pain      . ipratropium-albuterol (DUONEB) 0.5-2.5 (3)  MG/3ML SOLN Take 3 mLs by nebulization every 4 (four) hours as needed. For shortness of breath or wheezing      . loperamide (IMODIUM) 2 MG capsule Take 2 mg by mouth 4 (four) times daily as needed. For diarrhea.      . meclizine (ANTIVERT) 12.5 MG tablet Take 25 mg by mouth every 4 (four) hours as needed. For dizziness      . metoprolol succinate (TOPROL-XL) 50 MG 24 hr tablet Take 25 mg by mouth daily. Take with or immediately following a meal.      . omeprazole (PRILOSEC) 20 MG capsule Take 20 mg by mouth daily.      Marland Kitchen OVER THE COUNTER MEDICATION Take 1-3 tablets by mouth at bedtime as needed. Hyland's Leg Cramps PM for sleep and leg cramps      . pravastatin (PRAVACHOL) 80 MG tablet Take 80 mg by mouth at bedtime.      . senna-docusate (SENOKOT-S) 8.6-50 MG per tablet Take 2-4 tablets by mouth daily as needed. For constipation      . traMADol (ULTRAM) 50  MG tablet Take 50-100 mg by mouth 4 (four) times daily as needed. For back pain      . vitamin B-12 (CYANOCOBALAMIN) 500 MCG tablet Take 500 mcg by mouth daily.       Assessment:  Patient is a 71 y.o F s/p fall with hip hemiarthroplasty on 11/16, transferred to [facility] 02/03/2012.  Pharmacy consulted to dose coumadin for VTE prophylaxis.  Cards note (Dr. Jacinto Halim) states no need for longterm anticoagulation unless recurrence of afib. INR 2.04 today. Looks like missed coumadin dose 11/21. Noted Rocephin changed to Cipro for UTI - Cipro will increase coumadin effects. Arixtra d/c due to low plt. HIT panel negative. Platelets remain low but up to 69 today. Will monitor her closely for s/s bleeding.   Goal of Therapy:  INR 2-3 Monitor platelets by anticoagulation protocol: Yes   Plan:  1) Coumadin 2 mg today  2) F/u INR    Thank you for allowing pharmacy to be a part of this patients care team.  Rushie Chestnut.D. Clinical Pharmacist 02/05/2012 8:44 AM Pager: [phone] Phone: [phone]

## 2012-02-05 NOTE — Plan of Care (Signed)
Problem: RH BLADDER ELIMINATION Goal: RH STG MANAGE BLADDER WITH ASSISTANCE STG Manage Bladder mod With Assistance  Outcome: Not Progressing Incont of bladder

## 2012-02-05 NOTE — Progress Notes (Signed)
Occupational Therapy Session Note  Patient Details  Name: [name] [name] MRN: [id number] Date of Birth: 06/05/1940  Today's Date: 02/05/2012 Time:  - 1115-1200 and 4098-1191 Total time (min)=75 minutes   Skilled Therapeutic Interventions/Progress Updates: AM:  ADL in bed as patient c/o fatigue and pain from therapy prior to OT session.  Focus on participation in the Bathing and dressing session in spite of pain and adhering to Total Hip Precautions (THP).  Patient stated she will have sons bring clothes to wear - had none  PM:  Patient reluctant to get OOB but concurred and demonstrated good adherence to THP.     Therapy Documentation Precautions:  Precautions Precautions: Posterior Hip;Fall Precaution Comments: patient unable to recall hip precautions, plan to continue eduation regarding precautions Restrictions Weight Bearing Restrictions: Yes RLE Weight Bearing: Weight bearing as tolerated   Pain: 3/10 both back and right hip   See FIM for current functional status  Therapy/Group: Individual Therapy  Bud Face [facility] 02/05/2012, 2:15 PM

## 2012-02-06 ENCOUNTER — Inpatient Hospital Stay (HOSPITAL_COMMUNITY): Payer: Medicare Other | Admitting: Physical Therapy

## 2012-02-06 LAB — PROTIME-INR
INR: 1.92 — ABNORMAL HIGH (ref 0.00–1.49)
Prothrombin Time: 21.2 seconds — ABNORMAL HIGH (ref 11.6–15.2)

## 2012-02-06 MED ORDER — WARFARIN SODIUM 3 MG PO TABS
3.0000 mg | ORAL_TABLET | Freq: Once | ORAL | Status: AC
  Administered 2012-02-06: 3 mg via ORAL
  Filled 2012-02-06: qty 1

## 2012-02-06 NOTE — Progress Notes (Signed)
ANTICOAGULATION CONSULT NOTE   Pharmacy Consult for warfarin Indication: VTE prophylaxis  No Known Allergies  Patient Measurements: Height: 5\' 5"  (165.1 cm) Weight: 118 lb 6.2 oz (53.7 kg) IBW/kg (Calculated) : 57   Vital Signs: Temp: 98 F (36.7 C) (11/24 0612) Temp src: Oral (11/24 0612) BP: 120/50 mmHg (11/24 0612) Pulse Rate: 79  (11/24 0612)  Labs:  Basename 02/06/12 0600 02/05/12 0438 02/04/12 0610  HGB -- -- 11.2*  HCT -- -- 35.4*  PLT -- -- 69*  APTT -- -- --  LABPROT 21.2* 22.2* 19.7*  INR 1.92* 2.04* 1.73*  HEPARINUNFRC -- -- --  CREATININE -- -- 0.44*  CKTOTAL -- -- --  CKMB -- -- --  TROPONINI -- -- --    Estimated Creatinine Clearance: 55.5 ml/min (by C-G formula based on Cr of 0.44).   Medical History: Past Medical History  Diagnosis Date  . Hypertension   . Myocardial infarction   . Shortness of breath   . Asthma   . COPD (chronic obstructive pulmonary disease)     emphysema    . Pneumonia     hx pneumonia ,chronic bronchitis   . H/O blood clots   . Chronic kidney disease     current   UTI   being treated  . GERD (gastroesophageal reflux disease)   . Headache     hx migraines  . Blood dyscrasia     hx blood clotts  . Arthritis     Medications:  Prescriptions prior to admission  Medication Sig Dispense Refill  . amLODipine (NORVASC) 10 MG tablet Take 10 mg by mouth daily.      Marland Kitchen aspirin 81 MG chewable tablet Chew 81 mg by mouth daily.      Marland Kitchen docusate sodium (COLACE) 100 MG capsule Take 100 mg by mouth daily as needed. For constipation      . fesoterodine (TOVIAZ) 4 MG TB24 Take 4 mg by mouth at bedtime.      . fish oil-omega-3 fatty acids 1000 MG capsule Take 1 g by mouth daily.      . furosemide (LASIX) 20 MG tablet Take 10 mg by mouth daily as needed. For leg edema      . ibuprofen (ADVIL,MOTRIN) 200 MG tablet Take 600 mg by mouth every 6 (six) hours as needed. For pain      . ipratropium-albuterol (DUONEB) 0.5-2.5 (3) MG/3ML SOLN  Take 3 mLs by nebulization every 4 (four) hours as needed. For shortness of breath or wheezing      . loperamide (IMODIUM) 2 MG capsule Take 2 mg by mouth 4 (four) times daily as needed. For diarrhea.      . meclizine (ANTIVERT) 12.5 MG tablet Take 25 mg by mouth every 4 (four) hours as needed. For dizziness      . metoprolol succinate (TOPROL-XL) 50 MG 24 hr tablet Take 25 mg by mouth daily. Take with or immediately following a meal.      . omeprazole (PRILOSEC) 20 MG capsule Take 20 mg by mouth daily.      Marland Kitchen OVER THE COUNTER MEDICATION Take 1-3 tablets by mouth at bedtime as needed. Hyland's Leg Cramps PM for sleep and leg cramps      . pravastatin (PRAVACHOL) 80 MG tablet Take 80 mg by mouth at bedtime.      . senna-docusate (SENOKOT-S) 8.6-50 MG per tablet Take 2-4 tablets by mouth daily as needed. For constipation      . traMADol (ULTRAM) 50  MG tablet Take 50-100 mg by mouth 4 (four) times daily as needed. For back pain      . vitamin B-12 (CYANOCOBALAMIN) 500 MCG tablet Take 500 mcg by mouth daily.       Assessment:  Patient is a 71 y.o F s/p fall with hip hemiarthroplasty on 11/16, transferred to [facility] 02/03/2012.  Pharmacy consulted to dose coumadin for VTE prophylaxis.  Cards note (Dr. Jacinto Halim) states no need for longterm anticoagulation unless recurrence of afib. INR 1.92 today. Looks like missed coumadin dose 11/21. Noted Rocephin changed to Cipro for UTI - Cipro will increase coumadin effects. Arixtra d/c due to low plt. HIT panel negative. Platelets remain low but up to 69 today. Will monitor her closely for s/s bleeding.   Goal of Therapy:  INR 2-3 Monitor platelets by anticoagulation protocol: Yes   Plan:  1) Coumadin 3 mg today  2) F/u INR    Thank you for allowing pharmacy to be a part of this patients care team.  Rushie Chestnut.D. Clinical Pharmacist 02/06/2012 9:12 AM Pager: [phone] Phone: [phone]

## 2012-02-06 NOTE — Progress Notes (Signed)
Physical Therapy Note  Patient Details  Name: [name] [name] MRN: [id number] Date of Birth: [birth year] Today's Date: 02/06/2012  1300-1355 (55 minutes) individual Pain: 4/10 right hip/premedicated Precautions: RT posterior hip/WBAT (pt unable to recall precautions/ reviewed written precautions Focus of treatment: bed mobility training, transfer training, bilateral LE AROM/strengthening; gait training/endurance  Treatment: transfers stand/turn RW min assist; sit to supine (mat) min assist RT LE to observe precautions ; supine to sit min assist RT LE to observe precautions; bilateral LE AROM/strengthening X 20 - heel slides, ankle pumps, SAQs; gait 80 feet RW min assist for safety.   Lorance Pickeral,JIM 02/06/2012, 7:31 AM

## 2012-02-06 NOTE — Progress Notes (Signed)
Subjective/Complaints:  Right hip occasionally tender, especially with activity No new complaints. Still some urinary urgency  Objective: Vital Signs: Blood pressure 120/50, pulse 79, temperature 98 F (36.7 C), temperature source Oral, resp. rate 18, height 5\' 5"  (1.651 m), weight 53.7 kg (118 lb 6.2 oz), SpO2 99.00%. No results found.  Basename 02/04/12 0610  WBC 5.6  HGB 11.2*  HCT 35.4*  PLT 69*    Basename 02/04/12 0610  NA 140  K 4.0  CL 104  CO2 30  GLUCOSE 120*  BUN 11  CREATININE 0.44*  CALCIUM 9.0   CBG (last 3)  No results found for this basename: GLUCAP:3 in the last 72 hours  Wt Readings from Last 3 Encounters:  02/03/12 53.7 kg (118 lb 6.2 oz)  02/03/12 52.617 kg (116 lb)  02/03/12 52.617 kg (116 lb)    Physical Exam:  Nursing note and vitals reviewed.  Constitutional: She is oriented to person, place, and time.  Frail appearing female,    HENT:  Head: Normocephalic and atraumatic.  Eyes: Pupils are equal, round, and reactive to light.  Neck: Normal range of motion. Neck supple.  Cardiovascular: Normal rate and regular rhythm.  Abdominal: Soft. Bowel sounds are normal.  Neurological: She is alert and oriented to person, place, and time.  Motor strength is 5/5 in bilateral deltoid, biceps, triceps, grip  5/5 in left hip flexor knee extensor ankle dorsiflexor and plantar flexor  Right lower extremity 3/5 in the hip flexor, 3 minus knee extensor, 4+ in the ankle dorsiflexor and plantar flexor  Sensation is intact to light touch in both upper and lower extremities  Skin: right lateral hip incision staples intact no evidence of drainage dried blood, left lateral knee incision clean dry intact no evidence of drainage or erythema      Assessment/Plan: 1. Functional deficits secondary to proximal right femur fx, rib fx's which require 3+ hours per day of interdisciplinary therapy in a comprehensive inpatient rehab setting. Physiatrist is providing close  team supervision and 24 hour management of active medical problems listed below. Physiatrist and rehab team continue to assess barriers to discharge/monitor patient progress toward functional and medical goals. FIM: FIM - Bathing Bathing Steps Patient Completed: Chest;Right Arm;Left Arm;Abdomen;Right upper leg;Left upper leg (bottom washed by nursing earlier jper patient) Bathing: 4: Min-Patient completes 8-9 10f 10 parts or 75+ percent  FIM - Upper Body Dressing/Undressing Upper body dressing/undressing: 0: Wears gown/pajamas-no public clothing FIM - Lower Body Dressing/Undressing Lower body dressing/undressing: 0: Wears Oceanographer  FIM - Hotel manager Devices: Grab bar or rail for support Toileting: 2: Max-Patient completed 1 of 3 steps  FIM - Diplomatic Services operational officer Devices: Grab bars Toilet Transfers: 4-To toilet/BSC: Min A (steadying Pt. > 75%);4-From toilet/BSC: Min A (steadying Pt. > 75%)  FIM - Bed/Chair Transfer Bed/Chair Transfer Assistive Devices: HOB elevated Bed/Chair Transfer: 4: Bed > Chair or W/C: Min A (steadying Pt. > 75%)  FIM - Locomotion: Wheelchair Locomotion: Wheelchair: 1: Total Assistance/staff pushes wheelchair (Pt<25%) FIM - Locomotion: Ambulation Locomotion: Ambulation Assistive Devices: Designer, industrial/product Ambulation/Gait Assistance: 4: Min assist Locomotion: Ambulation: 1: Travels less than 50 ft with minimal assistance (Pt.>75%)  Comprehension Comprehension Mode: Auditory Comprehension: 5-Understands basic 90% of the time/requires cueing < 10% of the time  Expression Expression Mode: Verbal Expression: 5-Expresses basic 90% of the time/requires cueing < 10% of the time.  Social Interaction Social Interaction: 5-Interacts appropriately 90% of the time - Needs monitoring or encouragement  for participation or interaction.  Problem Solving Problem Solving: 5-Solves basic 90% of the time/requires  cueing < 10% of the time  Memory Memory: 5-Recognizes or recalls 90% of the time/requires cueing < 10% of the time  Medical Problem List and Plan:  1. DVT Prophylaxis/Anticoagulation: Pharmaceutical: Coumadin  2. Pain Management: continue prn oxycodone.  3. Mood: Motivated to go home. In good spirits.  4. Neuropsych: This patient is capable of making decisions on his/her own behalf.  5. PAF: Monitor HR with bid checks. Continue digoxin and Cardizem for rate control. On coumadin to prevent cardioembolic phenomena.  6. E coli/ Klebs pneumo UTI: continue cipro.    7. ABLA: Improved past transfusion. Recheck in am. Added iron supplement  8. GERD: Reporting increased symptoms off prilosec. Resume PPI.  9. Hyperactive bladder: complete antibiotic regimen. Gala Murdoch resumed  -pvr's low  -timed voids  LOS (Days) 3 A FACE TO FACE EVALUATION WAS PERFORMED  SWARTZ,ZACHARY T 02/06/2012, 7:54 AM

## 2012-02-07 ENCOUNTER — Inpatient Hospital Stay (HOSPITAL_COMMUNITY): Payer: Medicare Other | Admitting: Occupational Therapy

## 2012-02-07 ENCOUNTER — Inpatient Hospital Stay (HOSPITAL_COMMUNITY): Payer: Medicare Other

## 2012-02-07 DIAGNOSIS — W07XXXA Fall from chair, initial encounter: Secondary | ICD-10-CM

## 2012-02-07 DIAGNOSIS — S72009A Fracture of unspecified part of neck of unspecified femur, initial encounter for closed fracture: Secondary | ICD-10-CM

## 2012-02-07 MED ORDER — WARFARIN SODIUM 3 MG PO TABS
3.0000 mg | ORAL_TABLET | Freq: Once | ORAL | Status: AC
  Administered 2012-02-07: 3 mg via ORAL
  Filled 2012-02-07: qty 1

## 2012-02-07 NOTE — Progress Notes (Signed)
ANTICOAGULATION CONSULT NOTE   Pharmacy Consult for warfarin Indication: VTE prophylaxis  No Known Allergies  Patient Measurements: Height: 5\' 5"  (165.1 cm) Weight: 118 lb 6.2 oz (53.7 kg) IBW/kg (Calculated) : 57   Vital Signs: Temp: 98.2 F (36.8 C) (11/25 0548) Temp src: Oral (11/25 0548) BP: 113/57 mmHg (11/25 0933) Pulse Rate: 79  (11/25 0933)  Labs:  Basename 02/07/12 0818 02/06/12 0600 02/05/12 0438  HGB -- -- --  HCT -- -- --  PLT -- -- --  APTT -- -- --  LABPROT 22.7* 21.2* 22.2*  INR 2.10* 1.92* 2.04*  HEPARINUNFRC -- -- --  CREATININE -- -- --  CKTOTAL -- -- --  CKMB -- -- --  TROPONINI -- -- --    Estimated Creatinine Clearance: 55.5 ml/min (by C-G formula based on Cr of 0.44).  Assessment:  Patient is a 71 y.o F s/p fall with hip hemiarthroplasty on 11/16, transferred to [facility] 02/03/2012. Pt continues on coumadin for VTE proph with INR of 2.1 today. No bleeding noted, no CBC today.   Cards note (Dr. Jacinto Halim) states no need for longterm anticoagulation unless recurrence of afib.   Goal of Therapy:  INR 2-3 Monitor platelets by anticoagulation protocol: Yes   Plan:  1. Coumadin 3mg  PO x 1 tonight 2. F/u AM INR  Lysle Pearl, PharmD, BCPS Pager # [phone] 02/07/2012 11:15 AM

## 2012-02-07 NOTE — Progress Notes (Signed)
Physical Therapy Session Note  Patient Details  Name: BOBBI YOUNT MRN: [id number] Date of Birth: 29-Jul-1940  Today's Date: 02/07/2012 Time: 2841-3244 Time Calculation (min): 40 min  Short Term Goals: Week 1:  PT Short Term Goal 1 (Week 1): = LTGs  Skilled Therapeutic Interventions/Progress Updates:    Treatment focused on functional mobility with RW, transfer to simulated car in case she d/c to SNF in a car tomorrow with S, bed mobility in ADL apartment mod I, and gait in home environment/carpeted areas with S. Reviewed standing therex for functional strengthening on RLE for hip abduction, hamstring curls, extension, LAQ, and mini squats x 10 reps each. Pt requires frequent rest breaks due to fatigue and decreased respiratory status long standing. Returned to bed end of session to rest with overall close S.   Pt able to verbalize 2/3 hip precautions.   Therapy Documentation Precautions:  Precautions Precautions: Posterior Hip;Fall Precaution Comments: patient unable to recall hip precautions, plan to continue eduation regarding precautions Restrictions Weight Bearing Restrictions: Yes RLE Weight Bearing: Weight bearing as tolerated Pain: Pain Assessment Pain Assessment: No/denies pain Pain Score:   3 Pain Type: Surgical pain Pain Location: Hip Pain Orientation: Right Pain Intervention(s): Medication (See eMAR) Locomotion : Ambulation Ambulation/Gait Assistance: 5: Supervision   See FIM for current functional status  Therapy/Group: Individual Therapy  Karolee Stamps [facility] 02/07/2012, 12:12 PM

## 2012-02-07 NOTE — Discharge Summary (Signed)
[name] [name], [name] [name] NO.:  [id number]  MEDICAL RECORD NO.:  [id number]  LOCATION:  4008                         FACILITY:  [facility]  PHYSICIAN:  Ranelle Oyster, M.D.DATE OF BIRTH:  [birth year]  DATE OF ADMISSION:  02/03/2012 DATE OF DISCHARGE:  02/08/2012                              DISCHARGE SUMMARY   DATE OF DISCHARGE:  To SNF on February 08, 2012.  DISCHARGE DIAGNOSIS: 1. Right hip fracture. 2. Right rib fractures. 3. Chronic obstructive pulmonary disease-O2 dependent. 4. Paroxysmal atrial fibrillation. 5. Escherichia coli/ Klebsiella pneumonia UTI. 6. Acute blood loss anemia.  HISTORY OF PRESENT ILLNESS:  This patient is a 71 year old female with history of chronic kidney disease, severe COPD, O2 dependent, who fell on January 29, 2012, sustaining a right hip fracture and right fifth and sixth rib fractures.  She underwent right hip hemiarthroplasty by Dr. Lajoyce Corners.  Postop, she is weightbearing as tolerated and on Coumadin for DVT prophylaxis.  She did develop atrial fibrillation with rapid rate  that Dr. Jacinto Halim felt was likely in response to her anemia.  She was transfused with 2 units of packed red blood cells and started on IV Cardizem.  Bilateral lower extremity Dopplers done and were negative for DVT. CTA chest done due to history of chronic intermittent chest pain and this was negative for PE.  She was noted to have E coli, Klebsiella UTI, and was treated with IV Rocephin.  Dr. Jacinto Halim recommended Coumadin to prevent cardioembolic phenomenon.  The patient developed thrombocytopenia question due to Lovenox or Arixtra.  HIT panel was negative.  Therapies are ongoing and the patient was refusing SNF.  MD recommended CIR for progression.  PAST MEDICAL HISTORY:  Hypertension; MI; shortness of breath; COPD, O2 dependent; ongoing tobacco use; history of blood clots; chronic kidney disease; history of migraines; arthritis.  FUNCTIONAL HISTORY:  Patient was  independent prior to admission functional status.  The patient was +2 total assist 50% to 60% for transfers.  Min assist for ambulating 32 feet with rolling walker, with flexed posture.  HOSPITAL COURSE:  The patient was admitted to rehab on February 03, 2012, for inpatient therapies to consist of PT/OT at least 3 hours 5 days a week.  Past admission, physiatrist, rehab RN, and therapy team have worked together to provide customized collaborative interdisciplinary care.  Rehab RN has worked with patient on bowel and bladder program as well as wound care monitoring.  The patient's blood pressures were checked on b.i.d. basis and these have been reasonably controlled ranging from 100-120 systolics and diastolics have ranged in mid 50s range.  Heart rate has been stable.  Followup labs done past admission revealed H and H to be improving at 11.2 and 35.4, thrombocytopenia stable at 69.  Check of lytes revealed sodium 140, potassium 4.0, chloride 104, CO2 of 30, BUN 11, creatinine 0.44, glucose 120.  The patient has been maintained on Coumadin throughout the stay. She has completed a course of Cipro and followup urine culture of February 07, 2012, is pending.  Hip incision is healing well without any signs or symptoms of infection.  During the patient's stay in rehab, therapies have been ongoing and working  on the patient's progress, goal setting, as well as discussing barriers to discharge.  Currently, the patient is at supervision level for gait in home environment and carpeted areas.  She requires supervision for car transfers.  OT has worked with patient on self-care tasks as well as strengthening and use of Adaptic equipment to help improve independence with self-care.  The patient is at close supervision for these tasks.  Family is unable to provide supervision needed and would prefer for further followup therapies at SNF.  Bed is available at [facility] for February 08, 2012, and  the patient is discharged to this facility in improved condition.  DISCHARGE MEDICATIONS: 1. Digoxin 0.25 mg p.o. per day. 2. Cardizem 240 mg p.o. per day. 3. Detrol 4 mg p.o. per day. 4. Niferex 150 mg p.o. per day. 5. Xopenex nebs b.i.d. 6. Metoprolol 25 mg p.o. per day. 7. Nicotine patch 21 mg change daily. 8. Protonix 40 mg per day. 9. Coumadin 2 mg daily in evenings. 10.Tylenol 325-650 mg p.o. every 4 hours p.r.n. pain. 11.Ultram 50 mg p.o. every 6 hours p.r.n. moderate pain.  DIET:  Regular dysphagia, three textures as the patient is edentulous.  SPECIAL INSTRUCTIONS:  Progressive PT/OT to continue past discharge. Continue Coumadin with INR goal at 2-3. Next protime check on 11/29. Right total hip precautions.  FOLLOWUP:  The patient to follow up with Dr. Lajoyce Corners in 2 weeks for postop check.  Follow up with Dr. Jacinto Halim in 2 weeks for routine check and Coumadin monitoring.     [name], P.A.   ______________________________ Ranelle Oyster, M.D.    PL/MEDQ  D:  02/07/2012  T:  02/07/2012  Job:  [id number]  cc:   Nadara Mustard, MD Pamella Pert, MD

## 2012-02-07 NOTE — Progress Notes (Signed)
Discharge summary # [phone]

## 2012-02-07 NOTE — Progress Notes (Signed)
Occupational Therapy Session Notes  Patient Details  Name: [name] [name] MRN: [id number] Date of Birth: 23-Feb-1941  Today's Date: 02/07/2012  Short Term Goals: Week 1:  OT Short Term Goal 1 (Week 1): Short Term Goals = Long Term Goals  Skilled Therapeutic Interventions/Progress Updates:   Session #1 [phone] - 40 Minutes Missed 20 minutes of skilled therapy secondary to fatigue Individual Therapy No complaints of pain Upon entering room patient found supine in bed. Engaged in bed mobility for edge of bed -> w/c stand pivot transfer with close supervision. Focused skilled intervention on UB/LB bathing & dressing at sink level, sit->stands, overall activity tolerance/endurance, energy conservation prn, functional mobility throughout room and hallway using rolling walker. After mobility exercise patient with complaints of being fatigued and stated, "that's all I can do right now, I'm tired". Left patient seated in w/c with bilateral leg rests donned and call bell & phone within reach.   Session #2 [phone] - 55 Minutes Individual Therapy No complaints of pain Patient found supine in bed. Engaged in bed mobility and patient then propelled self from room -> ADL apartment. Focused on tub/shower transfer on/off tub transfer bench with supervision using rolling walker and grab bars prn. Patient then engaged in IADL task of cooking egg in kitchen using rolling walker. During simple meal prep patient required steady assist and two seated rest breaks. After kitchen task patient engaged in UE exercise using SCIFIT machine. Skilled intervention focusing on overall activity tolerance/endurance, UE strengthening, safety with use of rolling walker, functional mobility with rolling walker, sit/stands, dynamic standing balance/tolerance/endurance, and energy conservation prn. At end of session therapist propelled patient back to room and left seated in w/c with call bell & phone within reach.    Precautions:  Precautions Precautions: Posterior Hip;Fall Precaution Comments: patient unable to recall hip precautions, plan to continue eduation regarding precautions Restrictions Weight Bearing Restrictions: Yes RLE Weight Bearing: Weight bearing as tolerated  See FIM for current functional status  Miguel Christiana 02/07/2012, 7:42 AM

## 2012-02-07 NOTE — Progress Notes (Signed)
Occupational Therapy Discharge Summary  Patient Details  Name: [name] [name] MRN: [id number] Date of Birth: December 26, 1940  Today's Date: 02/07/2012  Patient has met 2 of 11 long term goals due to improved activity tolerance and improved balance.  Patient to discharge at overall supervision -> min assist and max assist for LB dressing level.  Patient's care partner requires assistance to provide the necessary physical assistance at discharge, patient to discharge -> SNF.  Reasons goals not met: Patient has not met most LTGs secondary to change in discharge plan, patient to discharge to SNF per family request. Patient is discharging earlier than expected, therefore not meeting most goals. Patient could benefit from longer length of stay on CIR, but recommending rehab at SNF setting secondary to discharge.   Recommendation:  Patient will benefit from ongoing skilled OT services in skilled nursing facility setting to continue to advance functional skills in the area of BADL, iADL and Reduce care partner burden.  Equipment: No equipment provided, defer to next venue  Reasons for discharge: discharge -> SNF per family request  Patient/family agrees with progress made and goals achieved: Yes  Precautions/Restrictions  Precautions Precautions: Posterior Hip;Fall Precaution Comments: patient unable to recall hip precautions, needs min verbal cues Restrictions Weight Bearing Restrictions: Yes RLE Weight Bearing: Weight bearing as tolerated  ADL - See FIM  Vision/Perception  Vision - History Baseline Vision: Wears glasses all the time Patient Visual Report: No change from baseline Vision - Assessment Eye Alignment: Within Functional Limits Vision Assessment: Vision not tested Perception Perception: Within Functional Limits Praxis Praxis: Intact   Cognition Overall Cognitive Status: Appears within functional limits for tasks assessed Arousal/Alertness: Awake/alert Orientation Level:  Oriented X4 Memory: Appears intact Awareness: Appears intact Problem Solving: Appears intact Safety/Judgment: Appears intact  Sensation Sensation Additional Comments: bilateral UEs appear intact Coordination Gross Motor Movements are Fluid and Coordinated: Yes Fine Motor Movements are Fluid and Coordinated: Yes  Motor - See Discharge Navigator  Trunk/Postural Assessment - See Discharge Navigator  Balance- See Discharge Navigator  Extremity/Trunk Assessment RUE Assessment RUE Assessment: Within Functional Limits LUE Assessment LUE Assessment: Within Functional Limits  See FIM for current functional status  [name] [name] 02/07/2012, 3:46 PM

## 2012-02-07 NOTE — Progress Notes (Signed)
Physical Therapy Discharge Summary  Patient Details  Name: [name] [name] MRN: [id number] Date of Birth: [birth year]  Today's Date: 02/07/2012 Time: 4098-1191 Time Calculation (min): 42 min Individual therapy; c/o pain in hip - premedicated. Focused on gait training with RW for endurance and strengthening, dynamic standing balance activity to reach outside BOS to toss horseshoes, and bed mobility/transfers. Pt at overall S level.   Patient has met 2 of 6 long term goals due to improved activity tolerance, improved balance and ability to compensate for deficits.  Patient to discharge at an ambulatory level Supervision with RW. Pt and family changed d/c plan to SNF over the weekend and LOS significantly shortened.  Reasons goals not met: Pt's LOS significantly shortened due to change in D/C plan and pt at S level, not yet reaching Mod I.  Recommendation:  Patient will benefit from ongoing skilled PT services in skilled nursing facility setting to continue to advance safe functional mobility, address ongoing impairments in gait, balance, ROM, strengthening, endurance, and minimize fall risk.  Equipment: No equipment provided. Will be provided at next facility.  Reasons for discharge: discharge from hospital  Patient/family agrees with progress made and goals achieved: Yes  PT Discharge Precautions/Restrictions Precautions Precautions: Posterior Hip;Fall Precaution Comments: pt able to verbalize 2/3 hip precautions Restrictions Weight Bearing Restrictions: Yes RLE Weight Bearing: Weight bearing as tolerated Vital Signs Oxygen Therapy O2 Device: None (Room air) O2 Flow Rate (L/min): 2 L/min Pain Pain Assessment Pain Assessment: 0-10 Pain Score:   3 Pain Type: Surgical pain Pain Location: Hip Pain Orientation: Right Patients Stated Pain Goal: 2 Pain Intervention(s): Medication (See eMAR) Vision/Perception  Vision - History Baseline Vision: Wears glasses all the time Patient  Visual Report: No change from baseline Vision - Assessment Eye Alignment: Within Functional Limits Vision Assessment: Vision not tested Perception Perception: Within Functional Limits Praxis Praxis: Intact  Cognition Overall Cognitive Status: Appears within functional limits for tasks assessed Arousal/Alertness: Awake/alert Orientation Level: Oriented X4 Memory: Appears intact Awareness: Appears intact Problem Solving: Appears intact Safety/Judgment: Appears intact Comments: pt very HOH Sensation Sensation Light Touch: Appears Intact Additional Comments: bilateral UEs appear intact Coordination Gross Motor Movements are Fluid and Coordinated: Yes Fine Motor Movements are Fluid and Coordinated: Yes    Trunk/Postural Assessment  Cervical Assessment Cervical Assessment: Within Functional Limits Thoracic Assessment Thoracic Assessment: Exceptions to [facility] Lumbar Assessment Lumbar Assessment: Within Functional Limits  Balance Static Sitting Balance Static Sitting - Level of Assistance: 6: Modified independent (Device/Increase time) Dynamic Sitting Balance Dynamic Sitting - Level of Assistance: 6: Modified independent (Device/Increase time) Static Standing Balance Static Standing - Level of Assistance: 5: Stand by assistance Dynamic Standing Balance Dynamic Standing - Level of Assistance: 5: Stand by assistance Extremity Assessment  RUE Assessment RUE Assessment: Within Functional Limits LUE Assessment LUE Assessment: Within Functional Limits RLE Assessment RLE Assessment:  (same as admission) LLE Assessment LLE Assessment: Within Functional Limits (decreased muscular endurance)  See FIM for current functional status  Tedd Sias 02/07/2012, 4:15 PM

## 2012-02-07 NOTE — Progress Notes (Signed)
Subjective/Complaints:  Complains of epigastic pain for about one hour so far this am. She also reports some wheezing No new complaints.    Objective: Vital Signs: Blood pressure 100/56, pulse 75, temperature 98.2 F (36.8 C), temperature source Oral, resp. rate 18, height 5\' 5"  (1.651 m), weight 53.7 kg (118 lb 6.2 oz), SpO2 96.00%. No results found. No results found for this basename: WBC:2,HGB:2,HCT:2,PLT:2 in the last 72 hours No results found for this basename: NA:2,K:2,CL:2,CO2:2,GLUCOSE:2,BUN:2,CREATININE:2,CALCIUM:2 in the last 72 hours CBG (last 3)  No results found for this basename: GLUCAP:3 in the last 72 hours  Wt Readings from Last 3 Encounters:  02/03/12 53.7 kg (118 lb 6.2 oz)  02/03/12 52.617 kg (116 lb)  02/03/12 52.617 kg (116 lb)    Physical Exam:  Nursing note and vitals reviewed.  Constitutional: She is oriented to person, place, and time.  Frail appearing female,    HENT:  Head: Normocephalic and atraumatic.  Eyes: Pupils are equal, round, and reactive to light.  Neck: Normal range of motion. Neck supple.  Cardiovascular: Normal rate and regular rhythm.  Chest: exp wheezes Abdominal: Soft. Bowel sounds are normal. Mild pain with palpation.  Neurological: She is alert and oriented to person, place, and time.  Motor strength is 5/5 in bilateral deltoid, biceps, triceps, grip  5/5 in left hip flexor knee extensor ankle dorsiflexor and plantar flexor  Right lower extremity 3/5 in the hip flexor, 3 minus knee extensor, 4+ in the ankle dorsiflexor and plantar flexor  Sensation is intact to light touch in both upper and lower extremities  Skin: right lateral hip incision staples intact no evidence of drainage dried blood, left lateral knee incision clean dry intact no evidence of drainage or erythema      Assessment/Plan: 1. Functional deficits secondary to proximal right femur fx, rib fx's which require 3+ hours per day of interdisciplinary therapy in a  comprehensive inpatient rehab setting. Physiatrist is providing close team supervision and 24 hour management of active medical problems listed below. Physiatrist and rehab team continue to assess barriers to discharge/monitor patient progress toward functional and medical goals. FIM: FIM - Bathing Bathing Steps Patient Completed: Chest;Right Arm;Left Arm;Abdomen;Right upper leg;Left upper leg (bottom washed by nursing earlier jper patient) Bathing: 4: Min-Patient completes 8-9 11f 10 parts or 75+ percent  FIM - Upper Body Dressing/Undressing Upper body dressing/undressing: 0: Wears gown/pajamas-no public clothing FIM - Lower Body Dressing/Undressing Lower body dressing/undressing: 0: Wears Oceanographer  FIM - Hotel manager Devices: Grab bar or rail for support Toileting: 2: Max-Patient completed 1 of 3 steps  FIM - Diplomatic Services operational officer Devices: Grab bars Toilet Transfers: 4-To toilet/BSC: Min A (steadying Pt. > 75%);4-From toilet/BSC: Min A (steadying Pt. > 75%)  FIM - Bed/Chair Transfer Bed/Chair Transfer Assistive Devices: HOB elevated Bed/Chair Transfer: 4: Bed > Chair or W/C: Min A (steadying Pt. > 75%)  FIM - Locomotion: Wheelchair Locomotion: Wheelchair: 1: Total Assistance/staff pushes wheelchair (Pt<25%) FIM - Locomotion: Ambulation Locomotion: Ambulation Assistive Devices: Designer, industrial/product Ambulation/Gait Assistance: 4: Min assist Locomotion: Ambulation: 1: Travels less than 50 ft with minimal assistance (Pt.>75%)  Comprehension Comprehension Mode: Auditory Comprehension: 5-Understands basic 90% of the time/requires cueing < 10% of the time  Expression Expression Mode: Verbal Expression: 5-Expresses basic 90% of the time/requires cueing < 10% of the time.  Social Interaction Social Interaction: 5-Interacts appropriately 90% of the time - Needs monitoring or encouragement for participation or  interaction.  Problem Solving Problem  Solving: 5-Solves basic 90% of the time/requires cueing < 10% of the time  Memory Memory: 5-Recognizes or recalls 90% of the time/requires cueing < 10% of the time  Medical Problem List and Plan:  1. DVT Prophylaxis/Anticoagulation: Pharmaceutical: Coumadin  2. Pain Management: continue prn oxycodone.  3. Mood: Motivated to go home. In good spirits.  4. Neuropsych: This patient is capable of making decisions on his/her own behalf.  5. PAF: Monitor HR with bid checks. Continue digoxin and Cardizem for rate control. On coumadin to prevent cardioembolic phenomena.  6. E coli/ Klebs pneumo UTI: continue cipro.   Need follow up culture 7. ABLA: Improved past transfusion. Recheck in am. Added iron supplement  8. GERD: Reporting increased symptoms off prilosec. Resume PPI.  9. Hyperactive bladder: complete antibiotic regimen. Gala Murdoch resumed  -pvr's low  -timed voids 10. Abdominal pain: only for one hour thus far. Follow clinically for now  LOS (Days) 4 A FACE TO FACE EVALUATION WAS PERFORMED  Cherl Gorney T 02/07/2012, 8:05 AM

## 2012-02-07 NOTE — Progress Notes (Signed)
Social Work Patient ID: [name] [name], female   DOB: 1941/01/28, 71 y.o.   MRN: [id number]  Received SNF bed offer today from [facility] and reviewed with pt.  She initially declines even considering SNF, however, reminded her of conversation over the weekend and that she is not safe to return home alone.  Pt then agrees and states, "... But I'm not going to stay there forever..."  Planning for transfer tomorrow.  Have left multiple messages for pt's son and daughter-in-law, however, have not heard back from them yet.  Will continue to try.  Treatment team aware of planned d/c 11/26 and MD agreed she is medically stable for d/c to SNF.  Cait Locust

## 2012-02-08 LAB — PROTIME-INR
INR: 2.65 — ABNORMAL HIGH (ref 0.00–1.49)
Prothrombin Time: 27 seconds — ABNORMAL HIGH (ref 11.6–15.2)

## 2012-02-08 NOTE — Progress Notes (Signed)
1051 Pt. Discharged to "[facility]" at [facility].  Report called and given to "D.J"  All VSS, 2L O2 continuous; no further questions asked.  Pt. Transported via ambulance to SNF.

## 2012-02-08 NOTE — Progress Notes (Signed)
Subjective/Complaints:  Feeling better today No new complaints.    Objective: Vital Signs: Blood pressure 108/61, pulse 67, temperature 97.7 F (36.5 C), temperature source Oral, resp. rate 19, height 5\' 5"  (1.651 m), weight 53.7 kg (118 lb 6.2 oz), SpO2 97.00%. No results found. No results found for this basename: WBC:2,HGB:2,HCT:2,PLT:2 in the last 72 hours No results found for this basename: NA:2,K:2,CL:2,CO2:2,GLUCOSE:2,BUN:2,CREATININE:2,CALCIUM:2 in the last 72 hours CBG (last 3)  No results found for this basename: GLUCAP:3 in the last 72 hours  Wt Readings from Last 3 Encounters:  02/03/12 53.7 kg (118 lb 6.2 oz)  02/03/12 52.617 kg (116 lb)  02/03/12 52.617 kg (116 lb)    Physical Exam:  Nursing note and vitals reviewed.  Constitutional: She is oriented to person, place, and time.  Frail appearing female,    HENT:  Head: Normocephalic and atraumatic.  Eyes: Pupils are equal, round, and reactive to light.  Neck: Normal range of motion. Neck supple.  Cardiovascular: Normal rate and regular rhythm.  Chest: exp wheezes Abdominal: Soft. Bowel sounds are normal. Mild pain with palpation.  Neurological: She is alert and oriented to person, place, and time.  Motor strength is 5/5 in bilateral deltoid, biceps, triceps, grip  5/5 in left hip flexor knee extensor ankle dorsiflexor and plantar flexor  Right lower extremity 3/5 in the hip flexor, 3 minus knee extensor, 4+ in the ankle dorsiflexor and plantar flexor  Sensation is intact to light touch in both upper and lower extremities  Skin: wound with slight serosanginous drainage/staples    Assessment/Plan: 1. Functional deficits secondary to proximal right femur fx, rib fx's which require 3+ hours per day of interdisciplinary therapy in a comprehensive inpatient rehab setting. Physiatrist is providing close team supervision and 24 hour management of active medical problems listed below. Physiatrist and rehab team continue  to assess barriers to discharge/monitor patient progress toward functional and medical goals.  To SNF today Continue staples/dressing  FIM: FIM - Bathing Bathing Steps Patient Completed: Chest;Right Arm;Left Arm;Abdomen;Front perineal area;Buttocks;Right upper leg;Left upper leg Bathing: 4: Min-Patient completes 8-9 58f 10 parts or 75+ percent  FIM - Upper Body Dressing/Undressing Upper body dressing/undressing steps patient completed: Thread/unthread right sleeve of pullover shirt/dresss;Thread/unthread left sleeve of pullover shirt/dress;Put head through opening of pull over shirt/dress;Pull shirt over trunk Upper body dressing/undressing: 5: Set-up assist to: Obtain clothing/put away FIM - Lower Body Dressing/Undressing Lower body dressing/undressing steps patient completed: Thread/unthread left pants leg;Pull pants up/down;Don/Doff left shoe;Fasten/unfasten left shoe Lower body dressing/undressing: 2: Max-Patient completed 25-49% of tasks  FIM - Hotel manager Devices: Grab bar or rail for support Toileting: 0: Activity did not occur  FIM - Diplomatic Services operational officer Devices: Grab bars Toilet Transfers: 0-Activity did not occur  FIM - Banker Devices: Arm rests;Walker Bed/Chair Transfer: 6: Supine > Sit: No assist;6: Sit > Supine: No assist;5: Bed > Chair or W/C: Supervision (verbal cues/safety issues);5: Chair or W/C > Bed: Supervision (verbal cues/safety issues)  FIM - Locomotion: Wheelchair Locomotion: Wheelchair: 1: Total Assistance/staff pushes wheelchair (Pt<25%) (no goal) FIM - Locomotion: Ambulation Locomotion: Ambulation Assistive Devices: Designer, industrial/product Ambulation/Gait Assistance: 5: Supervision Locomotion: Ambulation: 2: Travels 50 - 149 ft with supervision/safety issues  Comprehension Comprehension Mode: Auditory Comprehension: 6-Follows complex conversation/direction: With extra  time/assistive device  Expression Expression Mode: Verbal Expression: 6-Expresses complex ideas: With extra time/assistive device  Social Interaction Social Interaction: 6-Interacts appropriately with others with medication or extra time (anti-anxiety, antidepressant).  Problem  Solving Problem Solving: 6-Solves complex problems: With extra time  Memory Memory: 6-More than reasonable amt of time  Medical Problem List and Plan:  1. DVT Prophylaxis/Anticoagulation: Pharmaceutical: Coumadin  2. Pain Management: continue prn oxycodone.  3. Mood: Motivated to go home. In good spirits.  4. Neuropsych: This patient is capable of making decisions on his/her own behalf.  5. PAF: Monitor HR with bid checks. Continue digoxin and Cardizem for rate control. On coumadin to prevent cardioembolic phenomena.  6. E coli/ Klebs pneumo UTI: continue cipro.   Need follow up culture 7. ABLA: Improved past transfusion. Recheck in am. Added iron supplement  8. GERD: Reporting increased symptoms off prilosec. Resume PPI.  9. Hyperactive bladder: complete antibiotic regimen. toviaz resumed--overall better  -pvr's low  -timed voids 10. Abdominal pain: only for one hour thus far. Follow clinically for now  LOS (Days) 5 A FACE TO FACE EVALUATION WAS PERFORMED  Arleny Kruger T 02/08/2012, 7:40 AM

## 2012-02-08 NOTE — Progress Notes (Signed)
Social Work  Discharge Note  The overall goal for the admission was met for:   Discharge location: No - plan changed to SNF upon admit to CIR  Length of Stay: NO  Due to change in original d/c plan  Discharge activity level: No  Home/community participation: No  Services provided included: MD, RD, PT, OT, RN, TR, Pharmacy and SW  Financial Services: Medicare and Medicaid  Follow-up services arranged: Other: SNF at [facility]  Comments (or additional information):  Patient/Family verbalized understanding of follow-up arrangements: Yes  Individual responsible for coordination of the follow-up plan: patient  Confirmed correct DME delivered: NA  Adriano Bischof

## 2012-02-09 LAB — URINE CULTURE: Culture: NO GROWTH

## 2012-02-09 NOTE — Patient Care Conference (Signed)
Inpatient RehabilitationTeam Conference Note Date: 02/08/2012   Time: 2:00 PM    Patient Name: [name] [name]      Medical Record Number: [id number]  Date of Birth: 1940/10/16 Sex: Female         Room/Bed: 4008/4008-01 Payor Info: Payor: MEDICARE  Plan: MEDICARE PART A AND B  Product Type: *No Product type*     Admitting Diagnosis: RT HIP FX AFTER FALL  Admit Date/Time:  02/03/2012  4:38 PM Admission Comments: No comment available   Primary Diagnosis:  Fracture of right hip Principal Problem: Fracture of right hip  Patient Active Problem List   Diagnosis Date Noted  . Paroxysmal atrial fibrillation 02/03/2012  . UTI (lower urinary tract infection) 02/02/2012  . Thrombocytopenia 02/02/2012  . Fracture of right hip 01/29/2012  . Multiple rib fractures 01/29/2012  . Fall due to stumbling 01/29/2012  . CAD (coronary artery disease) 01/29/2012  . Hypertension 01/29/2012  . COPD (chronic obstructive pulmonary disease) 01/29/2012  . CKD (chronic kidney disease) 01/29/2012  . Osteoarthritis 01/29/2012  . Closed fracture of right distal radius 10/13/2011    Class: Acute    Expected Discharge Date: Expected Discharge Date: 02/08/12  Team Members Present: Physician: Dr. Faith Rogue Social Worker Present: Amada Jupiter, LCSW Nurse Present: Other (comment) Berta Minor, RN) PT Present: Karolee Stamps, PT OT Present: Mackie Pai, OT;Patricia Mat Carne, OT Other (Discipline and Name): Tora Duck, PPS Coordinator and [name], RN     Current Status/Progress Goal Weekly Team Focus  Medical   pain control better. wounds healing   wound care, maximize lung mgt  prepare for medically for lower level of care   Bowel/Bladder   incontinent of bladder  pt. will be continent upon discharge  toilet q 2hr   Swallow/Nutrition/ Hydration             ADL's   overall supervision -> min assist except max assist for LB ADLs  mod I  patient d/c'd -> SNF per family request   Mobility   S  overall  mod I overall  d/c SNF    Communication   NA         Safety/Cognition/ Behavioral Observations  min assist  assess Q shift      Pain   min assist; less than 3  management of pain less than 3  assess q shift; assess need for medication   Skin   min assist; free from breakdown and infection  pt. will be free of breakdown and infection upon discharge  assess q shift     Rehab Goals Patient on target to meet rehab goals: Yes *See Interdisciplinary Assessment and Plan and progress notes for long and short-term goals  Barriers to Discharge: breathing, pain, activity tolerance    Possible Resolutions to Barriers:  supervision, snf level care    Discharge Planning/Teaching Needs:  SNF      Team Discussion:  Pt making good gains, however, need to d/c to SNF as family unable to meet care needs.  D/C today.  Revisions to Treatment Plan:  None   Continued Need for Acute Rehabilitation Level of Care: The patient requires daily medical management by a physician with specialized training in physical medicine and rehabilitation for the following conditions: Daily direction of a multidisciplinary physical rehabilitation program to ensure safe treatment while eliciting the highest outcome that is of practical value to the patient.: Yes Daily medical management of patient stability for increased activity during participation in an intensive rehabilitation  regime.: Yes Daily analysis of laboratory values and/or radiology reports with any subsequent need for medication adjustment of medical intervention for : Post surgical problems;Neurological problems  Esaiah Wanless 02/09/2012, 10:16 AM

## 2012-02-09 NOTE — Progress Notes (Signed)
[facility] Individual Statement of Services  Patient Name:  [name] [name]  Date:  02/09/2012  Welcome to the [facility].  Our goal is to provide you with an individualized program based on your diagnosis and situation, designed to meet your specific needs.  With this comprehensive rehabilitation program, you will be expected to participate in at least 3 hours of rehabilitation therapies Monday-Friday, with modified therapy programming on the weekends.  Your rehabilitation program will include the following services:   Physical Therapy (PT), Occupational Therapy (OT), 24 hour per day rehabilitation nursing, Therapeutic Recreaction (TR), Case Management (Social Worker), Rehabilitation Medicine, Nutrition Services and Pharmacy Services Weekly team conferences will be held on Tuesdays to discuss your progress.  Your  Social Worker will talk with you frequently to get your input and to update you on team discussions.  Team conferences with you and your family in attendance may also be held.  Expected length of stay: 5 days  Overall anticipated outcome: minimal assistance  Depending on your progress and recovery, your program may change.  Your  Social Worker will coordinate services and will keep you informed of any changes.  Your  Social Worker's name and contact numbers are listed  below.  The following services may also be recommended but are not provided by the [facility]:   Driving Evaluations  Home Health Rehabiltiation Services  Outpatient Rehabilitatation [facility]  Vocational Rehabilitation   Arrangements will be made to provide these services after discharge if needed.  Arrangements include referral to agencies that provide these services.  Your insurance has been verified to be:  Medicare and Medicaid Your primary doctor is:    Pertinent information will be shared with your doctor and your insurance company.  Social  Worker:  [place], [place] [phone]  Information discussed with and copy given to patient by: Amada Jupiter, 02/07/2012, 10:18 AM

## 2012-09-16 IMAGING — CT CT ABD-PELV W/ CM
2 of 5 series · 16 of 46 positions shown, 18 images · IV contrast (READICAT/WATER & [ID] OMNI 300)
Comparison: None.

***ADDENDUM*** CREATED: 05/21/2010 [DATE]

I just spoke by telephone with Dr. Ant and was able to review
the study in regards to the biliary dilatation.  The patient had a
CT chest on 06/15/2004 performed at [HOSPITAL].  Comparing
back to that earlier exam, the biliary dilatation and dilatation of
the main pancreatic duct is stable.  The left adrenal nodule is
also stable since that time, consistent with a benign process.
[place] characteristics of the left adrenal nodule on today's study
are consistent with benign adrenal adenoma.
***END ADDENDUM*** SIGNED BY: Hoi Vidal, M.D.
CLINICAL DATA: Weight loss.
CT ABDOMEN AND PELVIS WITH CONTRAST
TECHNIQUE: Multidetector CT imaging of the abdomen and pelvis was
performed following the standard protocol during bolus
administration of intravenous contrast.
Contrast: 100 ml Hmnipaque-UII

[Series 2: abdomen w/ · axial · 0.64mm/px · z∈[-352,+24]mm · 13 of 85 slices shown, 15 images]
[im 5/85  soft-tissue]
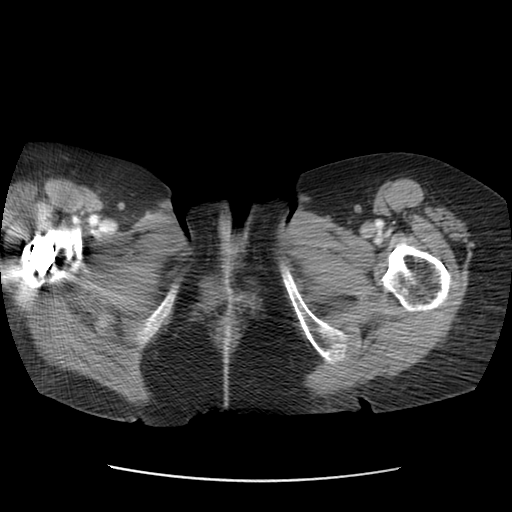
[im 5/85  bone]
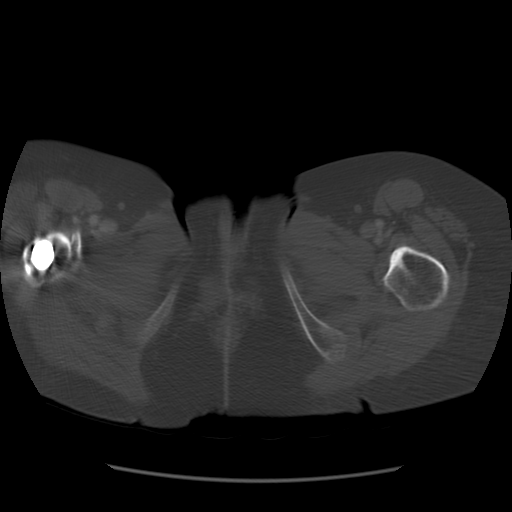
[im 14/85  soft-tissue]
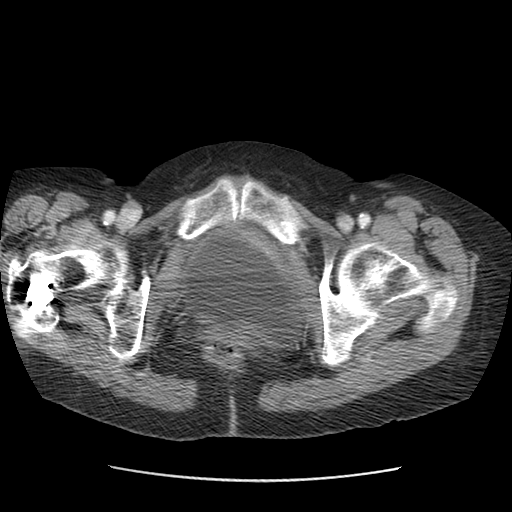
[im 18/85  soft-tissue]
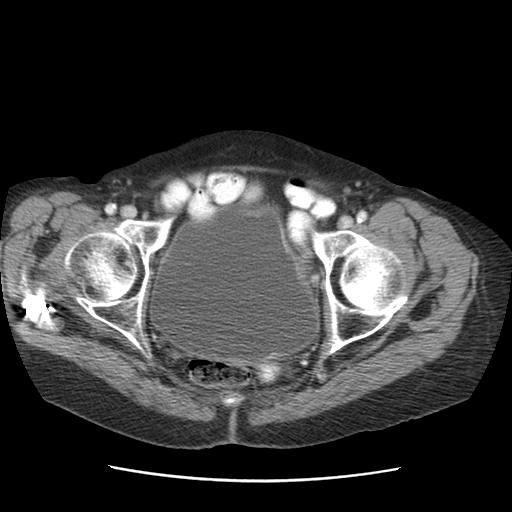
[im 23/85  soft-tissue]
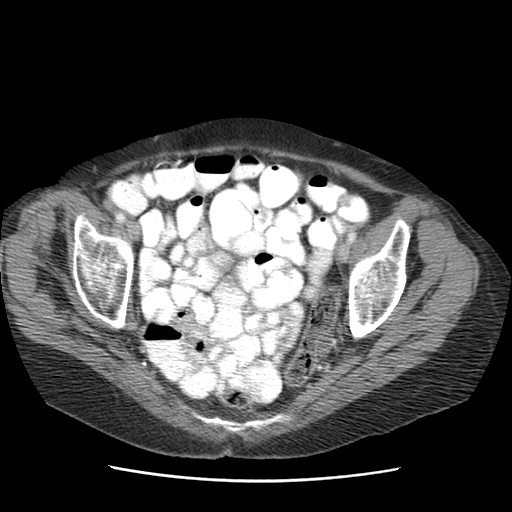
[im 31/85  soft-tissue]
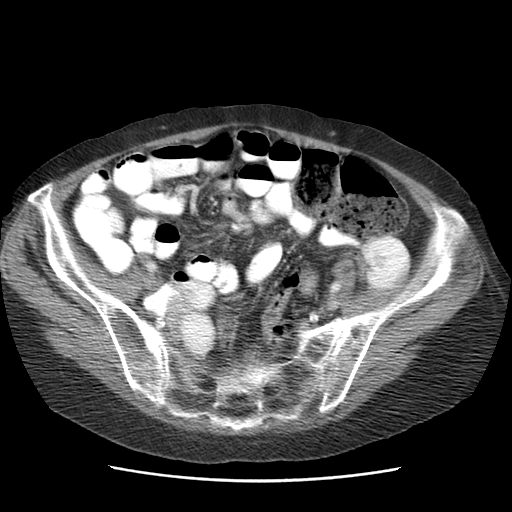
[im 36/85  soft-tissue]
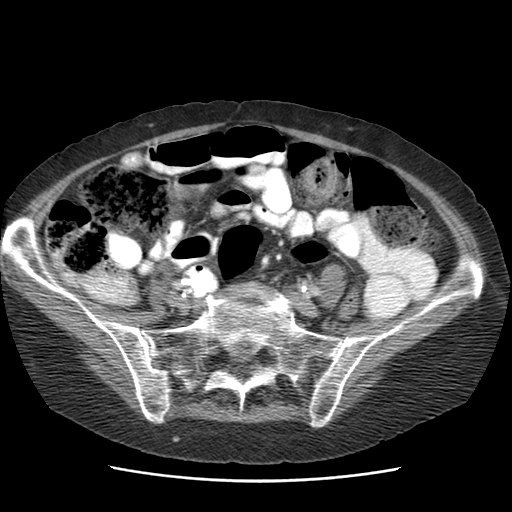
[im 45/85  soft-tissue]
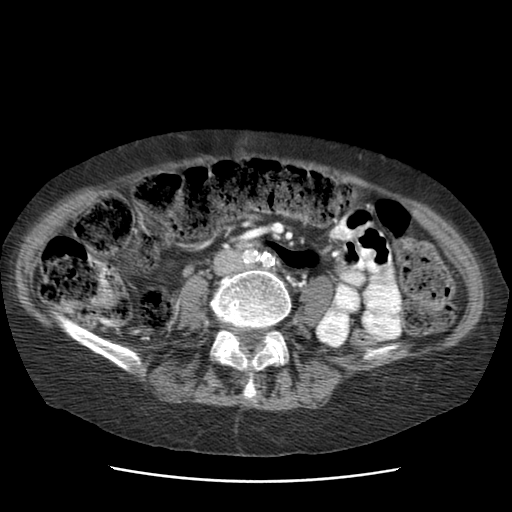
[im 49/85  soft-tissue]
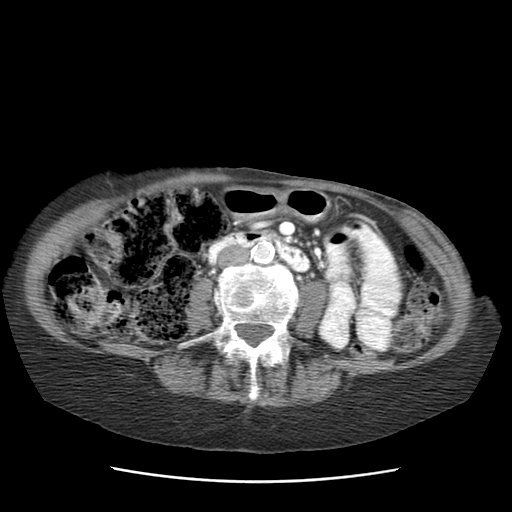
[im 54/85  soft-tissue]
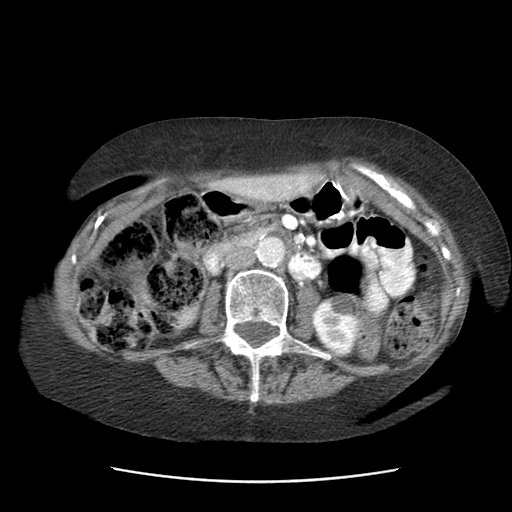
[im 54/85  bone]
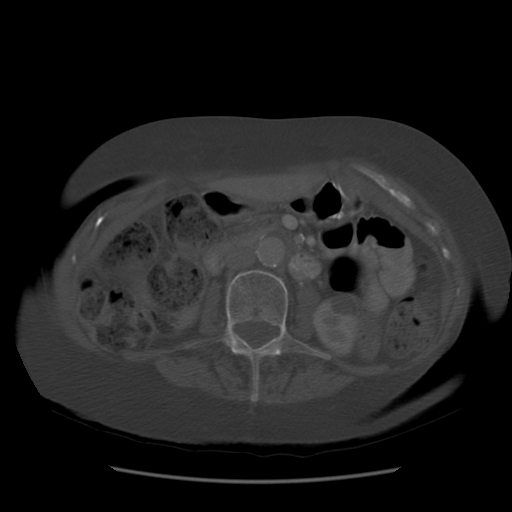
[im 62/85  soft-tissue]
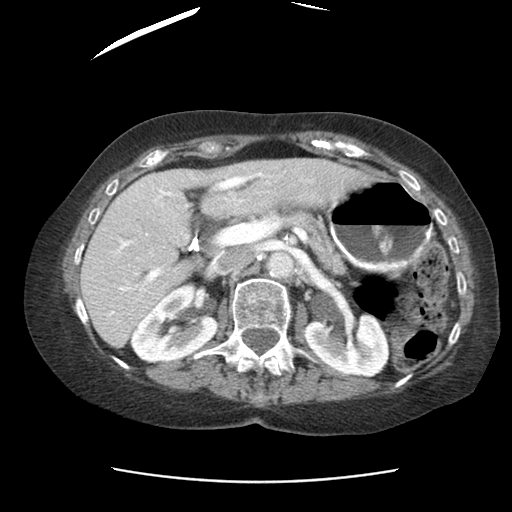
[im 67/85  soft-tissue]
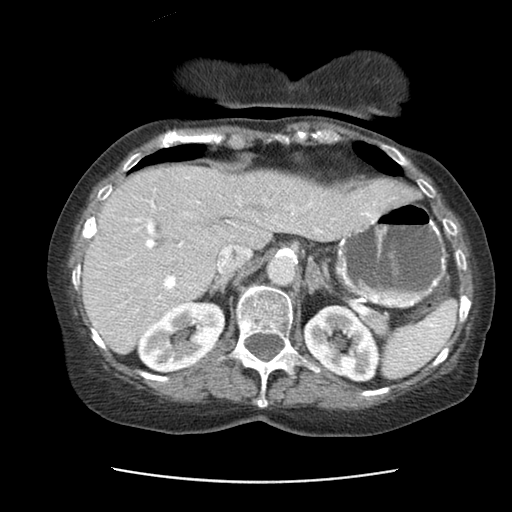
[im 71/85  soft-tissue]
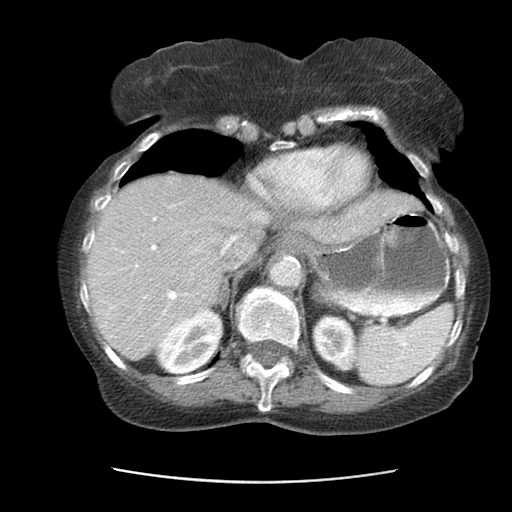
[im 80/85  soft-tissue]
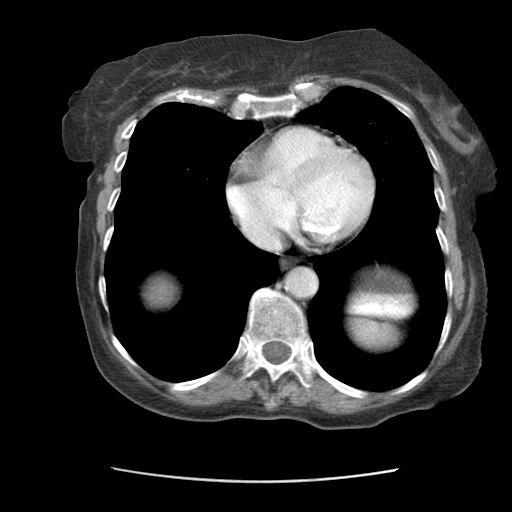

[Series 400: coronal · coronal · 0.86mm/px · 3 of 104 slices shown]
[im 35/104  soft-tissue]
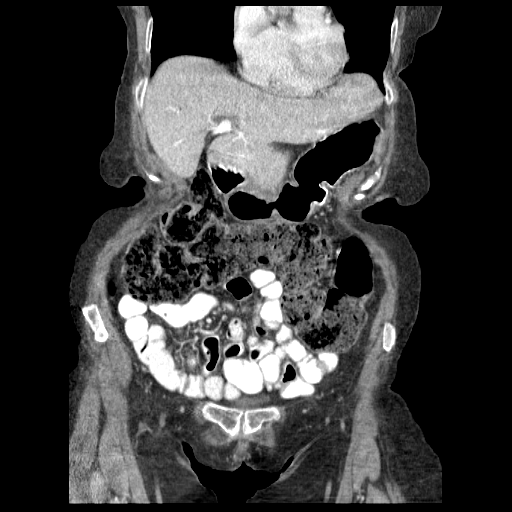
[im 46/104  soft-tissue]
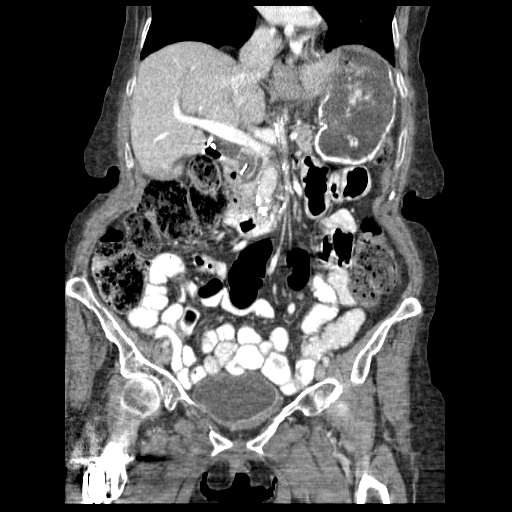
[im 58/104  soft-tissue]
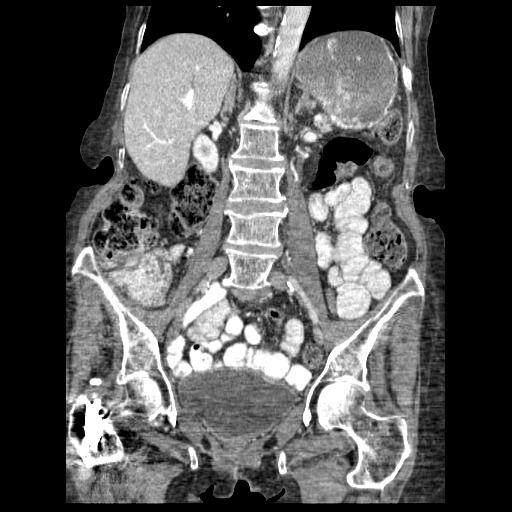

[16 of 46 positions shown; findings below may reference images not displayed]

FINDINGS: No focal abnormalities seen in the liver or spleen.
Gallbladder is surgically absent.  There is mild intrahepatic
biliary duct dilatation and the extrahepatic common duct measures
up to 14 mm in diameter, but gradually tapers to the level of the
ampulla.  A surgical clip is seen in the region of the ampulla,
positioned between the ampulla, distal common bile duct, and IVC.
Main pancreatic duct is slightly prominent at 3-4 mm diameter.
x 1.2 cm left adrenal nodule is noted.  There is some thickening of
the right adrenal gland.  1.5 cm cyst is seen in the lower pole of
the left kidney

No abdominal aortic aneurysm.  There is atherosclerotic
calcification in the wall of the abdominal aorta.  No abdominal
lymphadenopathy.  No free fluid.

The patient is noted have a prominent amount of stool in the
abdominal segments of the colon.

Imaging through the pelvis shows distention of the bladder.  Uterus
is surgically absent.  There is no adnexal mass.  No pelvic
sidewall lymphadenopathy.  No evidence for bowel obstruction.
Terminal ileum is normal. The appendix is not visualized, but there
is no edema or inflammation in the region of the cecum.  Prominent
stool is seen within the colon.

Bones are diffusely demineralized.  IM nail is seen in the right
femur, incompletely visualized.  No worrisome lytic or sclerotic
osseous abnormality.
IMPRESSION: Mild prominence of the biliary ducts and main pancreatic duct in
this patient status post cholecystectomy.  No pancreatic head mass
is visible.  Correlation with liver function tests may prove
helpful.

Prominent stool volume throughout the colon.  Imaging features
would be compatible with clinical constipation.

## 2012-09-16 IMAGING — CR DG CHEST 2V
2 series · 2 of 2 positions shown · non-contrast
Comparison: 06/13/2006.

CLINICAL DATA: Weight loss.  History of smoking.

CHEST - 2 VIEW

[view not recorded (1 of 2)]
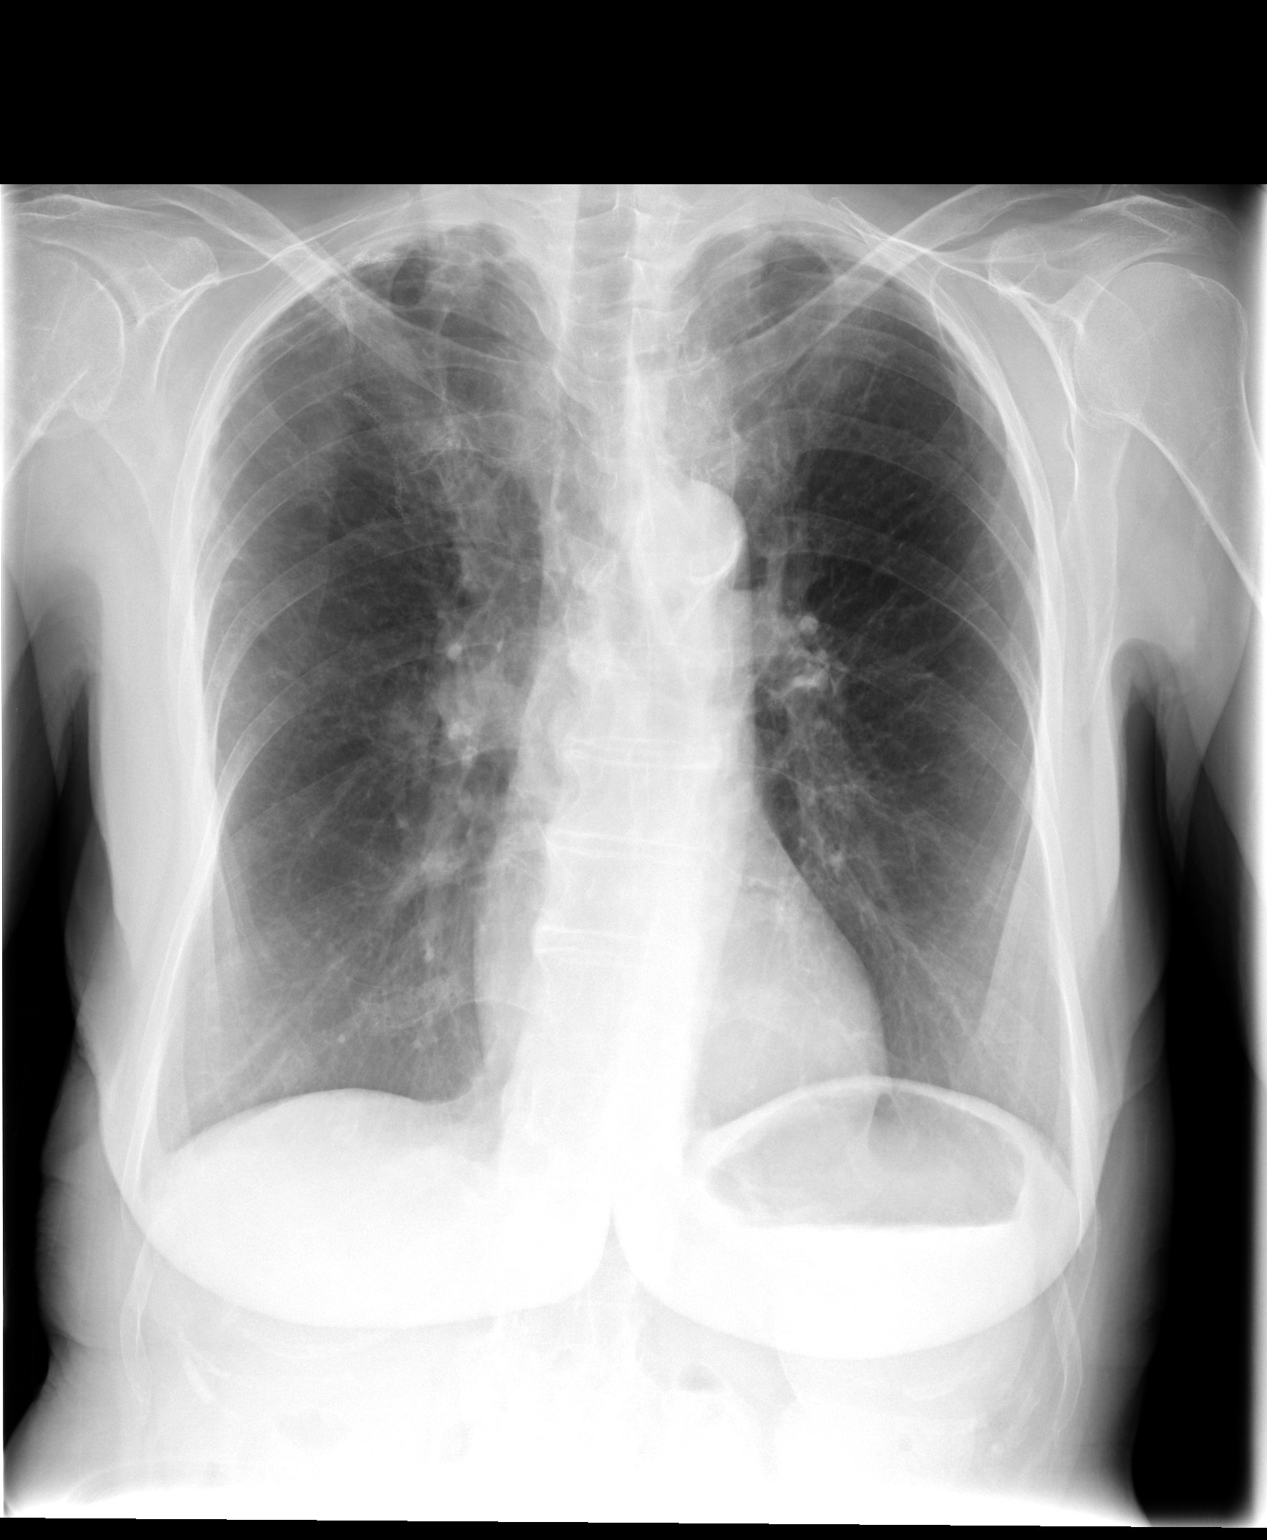

[view not recorded (2 of 2)]
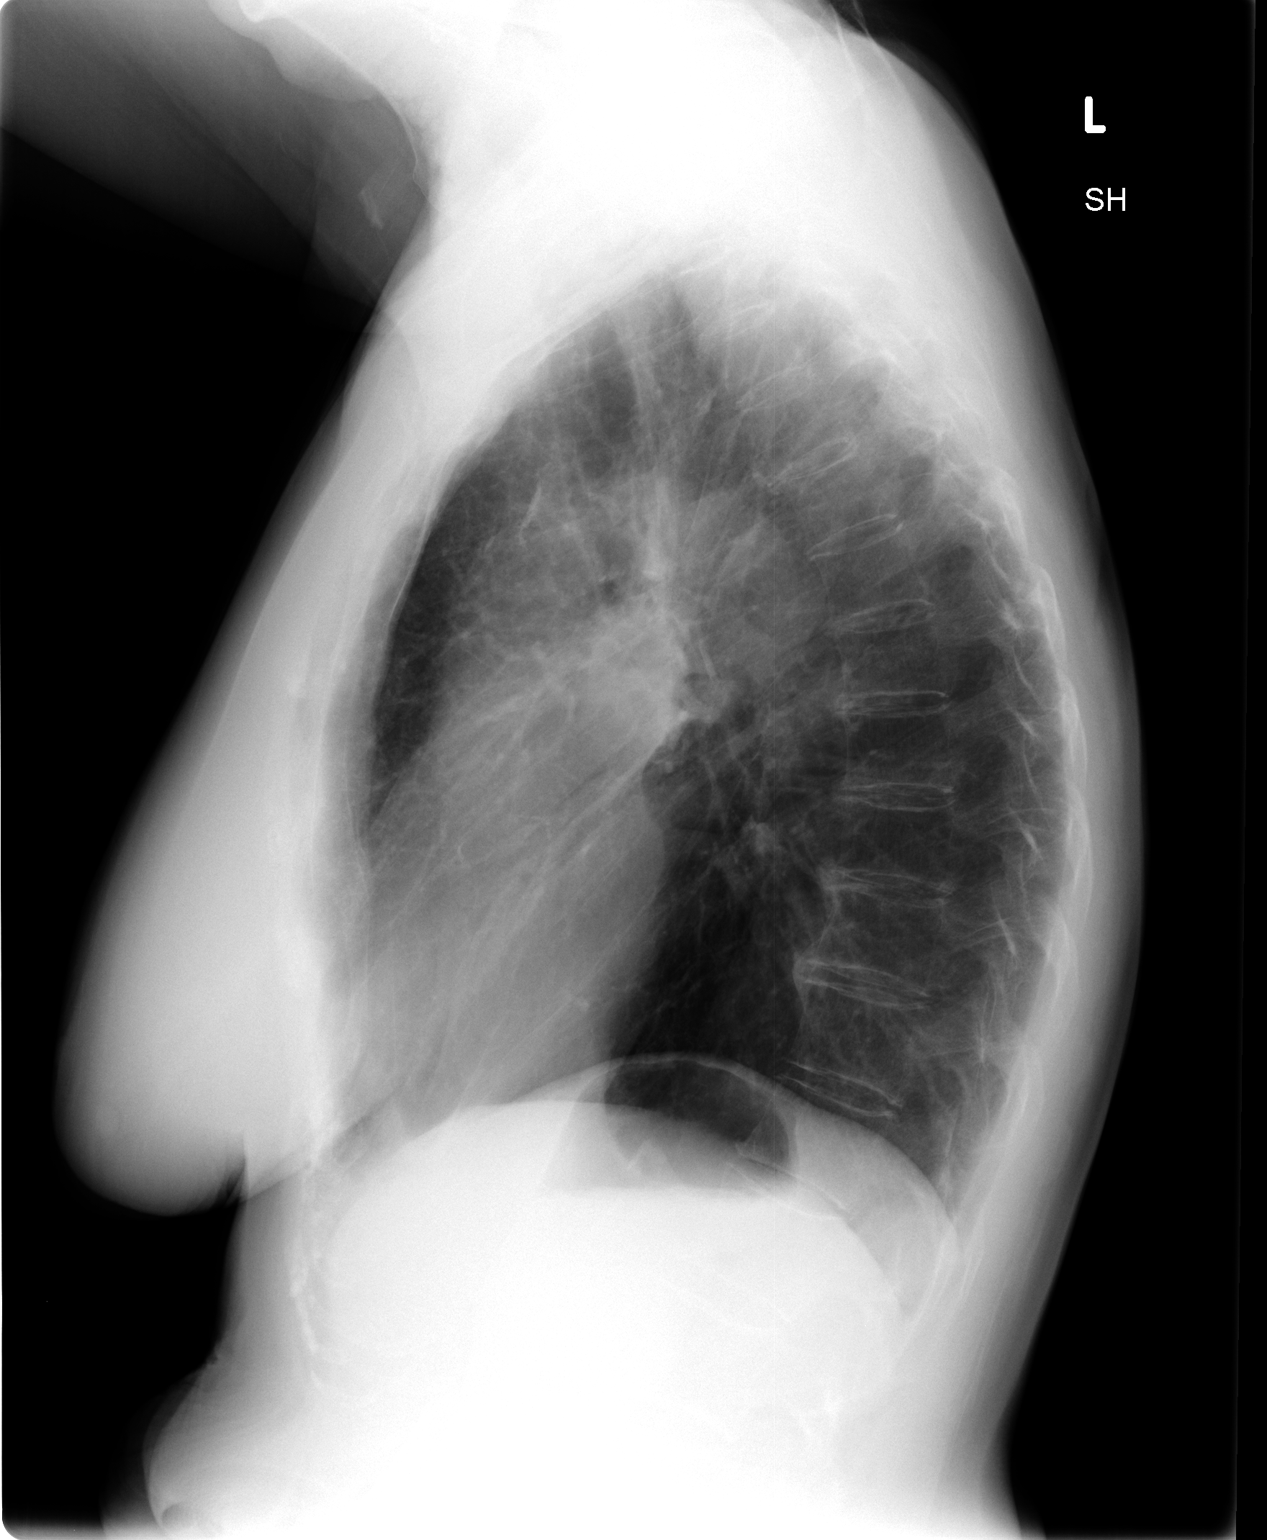

[2 of 2 positions shown; findings below may reference images not displayed]

FINDINGS: Emphysema is present.  Chronic blunting of the
costophrenic angles associated with emphysema.  Postoperative
changes of right apical lung resection.  No significant increase in
the bilateral pleural apical scarring compared to prior.
Cardiomediastinal silhouette and hilar shadows appear unchanged
from prior.  There is no mass lesion.  Lumbar spondylosis.
Cholecystectomy clips are present in the right upper quadrant.
IMPRESSION: 1.  Emphysema.
2.  Unchanged bilateral  pleural apical scarring and right apex
lung resection.
3.  No acute abnormality.

## 2013-07-08 ENCOUNTER — Emergency Department (HOSPITAL_COMMUNITY): Payer: Medicare Other

## 2013-07-08 ENCOUNTER — Encounter (HOSPITAL_COMMUNITY): Payer: Self-pay | Admitting: Emergency Medicine

## 2013-07-08 ENCOUNTER — Inpatient Hospital Stay (HOSPITAL_COMMUNITY)
Admission: EM | Admit: 2013-07-08 | Discharge: 2013-07-09 | DRG: 192 | Disposition: A | Discharge status: Home Health | Payer: Medicare Other | Attending: Internal Medicine | Admitting: Internal Medicine

## 2013-07-08 DIAGNOSIS — Z9981 Dependence on supplemental oxygen: Secondary | ICD-10-CM

## 2013-07-08 DIAGNOSIS — J441 Chronic obstructive pulmonary disease with (acute) exacerbation: Secondary | ICD-10-CM

## 2013-07-08 DIAGNOSIS — J45901 Unspecified asthma with (acute) exacerbation: Principal | ICD-10-CM

## 2013-07-08 DIAGNOSIS — I4891 Unspecified atrial fibrillation: Secondary | ICD-10-CM | POA: Diagnosis present

## 2013-07-08 DIAGNOSIS — Z602 Problems related to living alone: Secondary | ICD-10-CM

## 2013-07-08 DIAGNOSIS — Z9089 Acquired absence of other organs: Secondary | ICD-10-CM

## 2013-07-08 DIAGNOSIS — IMO0002 Reserved for concepts with insufficient information to code with codable children: Secondary | ICD-10-CM

## 2013-07-08 DIAGNOSIS — Z79899 Other long term (current) drug therapy: Secondary | ICD-10-CM

## 2013-07-08 DIAGNOSIS — K219 Gastro-esophageal reflux disease without esophagitis: Secondary | ICD-10-CM | POA: Diagnosis present

## 2013-07-08 DIAGNOSIS — I2510 Atherosclerotic heart disease of native coronary artery without angina pectoris: Secondary | ICD-10-CM | POA: Diagnosis present

## 2013-07-08 DIAGNOSIS — F17200 Nicotine dependence, unspecified, uncomplicated: Secondary | ICD-10-CM | POA: Diagnosis present

## 2013-07-08 DIAGNOSIS — I252 Old myocardial infarction: Secondary | ICD-10-CM

## 2013-07-08 DIAGNOSIS — I10 Essential (primary) hypertension: Secondary | ICD-10-CM | POA: Diagnosis present

## 2013-07-08 DIAGNOSIS — Z7901 Long term (current) use of anticoagulants: Secondary | ICD-10-CM

## 2013-07-08 DIAGNOSIS — Z8249 Family history of ischemic heart disease and other diseases of the circulatory system: Secondary | ICD-10-CM

## 2013-07-08 DIAGNOSIS — R0603 Acute respiratory distress: Secondary | ICD-10-CM

## 2013-07-08 DIAGNOSIS — N189 Chronic kidney disease, unspecified: Secondary | ICD-10-CM | POA: Diagnosis present

## 2013-07-08 DIAGNOSIS — I129 Hypertensive chronic kidney disease with stage 1 through stage 4 chronic kidney disease, or unspecified chronic kidney disease: Secondary | ICD-10-CM | POA: Diagnosis present

## 2013-07-08 DIAGNOSIS — Z801 Family history of malignant neoplasm of trachea, bronchus and lung: Secondary | ICD-10-CM

## 2013-07-08 DIAGNOSIS — J449 Chronic obstructive pulmonary disease, unspecified: Secondary | ICD-10-CM

## 2013-07-08 DIAGNOSIS — I480 Paroxysmal atrial fibrillation: Secondary | ICD-10-CM | POA: Diagnosis present

## 2013-07-08 LAB — CBC WITH DIFFERENTIAL/PLATELET
Basophils Absolute: 0 10*3/uL (ref 0.0–0.1)
Basophils Relative: 0 % (ref 0–1)
Eosinophils Absolute: 0 K/uL (ref 0.0–0.7)
Eosinophils Relative: 0 % (ref 0–5)
HCT: 38.9 % (ref 36.0–46.0)
Hemoglobin: 11.3 g/dL — ABNORMAL LOW (ref 12.0–15.0)
Lymphocytes Relative: 15 % (ref 12–46)
Lymphs Abs: 1.3 K/uL (ref 0.7–4.0)
MCH: 20.8 pg — ABNORMAL LOW (ref 26.0–34.0)
MCHC: 29 g/dL — ABNORMAL LOW (ref 30.0–36.0)
MCV: 71.5 fL — ABNORMAL LOW (ref 78.0–100.0)
Monocytes Absolute: 1.1 K/uL — ABNORMAL HIGH (ref 0.1–1.0)
Monocytes Relative: 14 % — ABNORMAL HIGH (ref 3–12)
Neutro Abs: 5.8 10*3/uL (ref 1.7–7.7)
Neutrophils Relative %: 71 % (ref 43–77)
Platelets: 116 10*3/uL — ABNORMAL LOW (ref 150–400)
RBC: 5.44 MIL/uL — ABNORMAL HIGH (ref 3.87–5.11)
RDW: 18.9 % — ABNORMAL HIGH (ref 11.5–15.5)
WBC: 8.2 10*3/uL (ref 4.0–10.5)

## 2013-07-08 LAB — COMPREHENSIVE METABOLIC PANEL
ALT: 10 U/L (ref 0–35)
AST: 16 U/L (ref 0–37)
Albumin: 3.5 g/dL (ref 3.5–5.2)
CO2: 29 mEq/L (ref 19–32)
Chloride: 97 mEq/L (ref 96–112)
GFR calc non Af Amer: 69 mL/min — ABNORMAL LOW (ref >90)
Potassium: 4.3 mEq/L (ref 3.7–5.3)
Sodium: 140 mEq/L (ref 137–147)
Total Bilirubin: 0.3 mg/dL (ref 0.3–1.2)

## 2013-07-08 LAB — COMPREHENSIVE METABOLIC PANEL WITH GFR
Alkaline Phosphatase: 64 U/L (ref 39–117)
BUN: 22 mg/dL (ref 6–23)
Calcium: 9 mg/dL (ref 8.4–10.5)
Creatinine, Ser: 0.83 mg/dL (ref 0.50–1.10)
GFR calc Af Amer: 80 mL/min — ABNORMAL LOW (ref >90)
Glucose, Bld: 103 mg/dL — ABNORMAL HIGH (ref 70–99)
Total Protein: 7.5 g/dL (ref 6.0–8.3)

## 2013-07-08 LAB — MAGNESIUM: Magnesium: 1.8 mg/dL (ref 1.5–2.5)

## 2013-07-08 MED ORDER — DIGOXIN 250 MCG PO TABS
0.2500 mg | ORAL_TABLET | Freq: Every day | ORAL | Status: DC
  Administered 2013-07-09: 0.25 mg via ORAL
  Filled 2013-07-08: qty 1

## 2013-07-08 MED ORDER — ATORVASTATIN CALCIUM 40 MG PO TABS
40.0000 mg | ORAL_TABLET | Freq: Every day | ORAL | Status: DC
  Filled 2013-07-08: qty 1

## 2013-07-08 MED ORDER — METOPROLOL SUCCINATE ER 25 MG PO TB24
25.0000 mg | ORAL_TABLET | Freq: Every day | ORAL | Status: DC
  Administered 2013-07-09: 25 mg via ORAL
  Filled 2013-07-08 (×2): qty 1

## 2013-07-08 MED ORDER — ALBUTEROL SULFATE (2.5 MG/3ML) 0.083% IN NEBU
5.0000 mg | INHALATION_SOLUTION | Freq: Once | RESPIRATORY_TRACT | Status: AC
  Administered 2013-07-08: 5 mg via RESPIRATORY_TRACT
  Filled 2013-07-08: qty 6

## 2013-07-08 MED ORDER — ACETAMINOPHEN 325 MG PO TABS
650.0000 mg | ORAL_TABLET | Freq: Four times a day (QID) | ORAL | Status: DC | PRN
  Administered 2013-07-08 – 2013-07-09 (×2): 650 mg via ORAL
  Filled 2013-07-08 (×2): qty 2

## 2013-07-08 MED ORDER — TRAMADOL HCL 50 MG PO TABS
50.0000 mg | ORAL_TABLET | Freq: Four times a day (QID) | ORAL | Status: DC | PRN
  Administered 2013-07-09: 50 mg via ORAL
  Filled 2013-07-08: qty 1

## 2013-07-08 MED ORDER — ALBUTEROL SULFATE (2.5 MG/3ML) 0.083% IN NEBU
2.5000 mg | INHALATION_SOLUTION | Freq: Four times a day (QID) | RESPIRATORY_TRACT | Status: DC
  Administered 2013-07-09: 2.5 mg via RESPIRATORY_TRACT
  Filled 2013-07-08: qty 3

## 2013-07-08 MED ORDER — IPRATROPIUM BROMIDE 0.02 % IN SOLN
0.5000 mg | Freq: Once | RESPIRATORY_TRACT | Status: AC
  Administered 2013-07-08: 0.5 mg via RESPIRATORY_TRACT
  Filled 2013-07-08: qty 2.5

## 2013-07-08 MED ORDER — DOXYCYCLINE HYCLATE 100 MG PO TABS
100.0000 mg | ORAL_TABLET | Freq: Two times a day (BID) | ORAL | Status: DC
  Administered 2013-07-08 – 2013-07-09 (×2): 100 mg via ORAL
  Filled 2013-07-08 (×3): qty 1

## 2013-07-08 MED ORDER — FLUTICASONE PROPIONATE 50 MCG/ACT NA SUSP
1.0000 | Freq: Every day | NASAL | Status: DC
  Administered 2013-07-09: 1 via NASAL
  Filled 2013-07-08 (×2): qty 16

## 2013-07-08 MED ORDER — GUAIFENESIN ER 600 MG PO TB12: 600.0000 mg | ORAL_TABLET | Freq: Once | ORAL | Status: DC

## 2013-07-08 MED ORDER — DILTIAZEM HCL ER 240 MG PO CP24
240.0000 mg | ORAL_CAPSULE | Freq: Every day | ORAL | Status: DC
  Administered 2013-07-09: 240 mg via ORAL
  Filled 2013-07-08: qty 1

## 2013-07-08 MED ORDER — RIVAROXABAN 20 MG PO TABS
20.0000 mg | ORAL_TABLET | Freq: Every day | ORAL | Status: DC
  Administered 2013-07-08: 20 mg via ORAL
  Filled 2013-07-08 (×2): qty 1

## 2013-07-08 MED ORDER — IPRATROPIUM BROMIDE HFA 17 MCG/ACT IN AERS: 2.0000 | INHALATION_SPRAY | Freq: Once | RESPIRATORY_TRACT | Status: DC

## 2013-07-08 MED ORDER — IPRATROPIUM BROMIDE 0.02 % IN SOLN
0.5000 mg | Freq: Four times a day (QID) | RESPIRATORY_TRACT | Status: DC
  Administered 2013-07-09: 0.5 mg via RESPIRATORY_TRACT
  Filled 2013-07-08: qty 2.5

## 2013-07-08 MED ORDER — PANTOPRAZOLE SODIUM 40 MG PO TBEC
40.0000 mg | DELAYED_RELEASE_TABLET | Freq: Every day | ORAL | Status: DC
  Administered 2013-07-09: 40 mg via ORAL
  Filled 2013-07-08: qty 1

## 2013-07-08 MED ORDER — OXYBUTYNIN CHLORIDE 5 MG PO TABS
10.0000 mg | ORAL_TABLET | Freq: Every day | ORAL | Status: DC
  Administered 2013-07-08: 10 mg via ORAL
  Filled 2013-07-08 (×2): qty 2

## 2013-07-08 MED ORDER — ALBUTEROL SULFATE (2.5 MG/3ML) 0.083% IN NEBU: 2.5000 mg | INHALATION_SOLUTION | RESPIRATORY_TRACT | Status: DC | PRN

## 2013-07-08 MED ORDER — PREDNISONE 20 MG PO TABS
40.0000 mg | ORAL_TABLET | Freq: Once | ORAL | Status: AC
  Administered 2013-07-08: 40 mg via ORAL
  Filled 2013-07-08: qty 2

## 2013-07-08 MED ORDER — PREDNISONE 20 MG PO TABS
40.0000 mg | ORAL_TABLET | Freq: Every day | ORAL | Status: DC
  Administered 2013-07-09: 40 mg via ORAL
  Filled 2013-07-08 (×2): qty 2

## 2013-07-08 NOTE — H&P (Signed)
Date: 07/08/2013               Patient Name:  [name] [name] MRN: [id number]  DOB: Jul 06, 1940 Age / Sex: 73 y.o., female   PCP: Leonides Sake, MD         Medical Service: Internal Medicine Teaching Service         Attending Physician: Dr. Madilyn Fireman, MD    First Contact: Dr. Mechele Claude  Pager: [phone]  Second Contact: Dr. Eulas Post Pager: [phone]       After Hours (After 5p/  First Contact Pager: [phone]  weekends / holidays): Second Contact Pager: [phone]   Chief Complaint: shortness of breath, cough   History of Present Illness:  [name] [name] is a 73 year old woman with history of ?COPD on 2L home O2, paroxysmal atrial fibrillation on Xarelto, HTN who presents with shortness of breath and cough.   Patient states she has had worsening shortness of breath, worsening cough productive of white sputum x 1 week.  She has been using her albuterol nebulizer 4x daily with little relief; she also takes Flonase and Mucinex at home but no other COPD meds.  She also has associated chest tightness and costochrondral chest pain only with coughing.  She smoked 2 packs/day x 40+ years but quit last week when she started not feeling well, intends for this to be permanent.  She has been on home O2 since 2013 after hospitalization for a fall.  She has never seen a pulmonologist or had PFTs.  Denies fever, abdominal pain, nausea/vomiting, lightheadedness, LOC; no sick contacts.  Patient lives alone at home.  Her son and his wife visit her daily and take her to grocery store, etc.  She walks around her apartment complex using walker but has not been able to be active for the past week due to feeling poorly.  She has a home health RN through [facility] who helps her with medications, and had home health PT after hospitalization for fall in 2013 but has since been discharged.  She follows regularly with PCP Dr. Lisbeth [name] and Dr. Einar [name] (cardiology) annually.   In ED, patient received duonebs and prednisone 40  mg with some improvement of symptoms.  IMTS subsequently called for admission.   Meds: No current facility-administered medications for this encounter.   Current Outpatient Prescriptions  Medication Sig Dispense Refill  . albuterol (PROVENTIL) (2.5 MG/3ML) 0.083% nebulizer solution Take 2.5 mg by nebulization every 6 (six) hours.      . Aspirin-Caffeine (BAYER BACK & BODY PAIN EX ST PO) Take 1 tablet by mouth 2 (two) times daily as needed (pain).      Marland Kitchen atorvastatin (LIPITOR) 40 MG tablet Take 40 mg by mouth daily.       . digoxin (LANOXIN) 0.25 MG tablet Take 0.25 mg by mouth daily.      Marland Kitchen diltiazem (DILACOR XR) 240 MG 24 hr capsule Take 240 mg by mouth daily.       Marland Kitchen docusate sodium (COLACE) 100 MG capsule Take 100 mg by mouth daily as needed (constipation).       . fish oil-omega-3 fatty acids 1000 MG capsule Take 1 g by mouth daily.      . fluticasone (FLONASE) 50 MCG/ACT nasal spray Place 1 spray into both nostrils daily.       . furosemide (LASIX) 40 MG tablet Take 40 mg by mouth daily.      Marland Kitchen guaiFENesin (MUCINEX) 600 MG 12 hr tablet Take  600 mg by mouth once.      . loperamide (IMODIUM) 2 MG capsule Take 2 mg by mouth 2 (two) times daily as needed for diarrhea or loose stools.       . metoprolol succinate (TOPROL-XL) 50 MG 24 hr tablet Take 25 mg by mouth daily. Take with or immediately following a meal.      . neomycin-bacitracin-polymyxin (NEOSPORIN) OINT Apply 1 application topically 3 (three) times daily as needed for wound care.      Marland Kitchen OVER THE COUNTER MEDICATION Take 1 tablet by mouth at bedtime. Hyland's Leg Cramps PM for sleep and leg cramps      . oxybutynin (DITROPAN) 5 MG tablet Take 10 mg by mouth at bedtime.       . pantoprazole (PROTONIX) 40 MG tablet Take 40 mg by mouth daily.       . rivaroxaban (XARELTO) 20 MG TABS tablet Take 20 mg by mouth daily with supper.      . traMADol (ULTRAM) 50 MG tablet Take 50-100 mg by mouth 2 (two) times daily as needed (pain).       .  vitamin B-12 (CYANOCOBALAMIN) 500 MCG tablet Take 500 mcg by mouth daily.        Allergies: Allergies as of 07/08/2013  . (No Known Allergies)   Past Medical History  Diagnosis Date  . Hypertension   . Myocardial infarction   . Shortness of breath   . Asthma   . COPD (chronic obstructive pulmonary disease)     emphysema    . Pneumonia     hx pneumonia ,chronic bronchitis   . H/O blood clots   . Chronic kidney disease     current   UTI   being treated  . GERD (gastroesophageal reflux disease)   . Headache(784.0)     hx migraines  . Blood dyscrasia     hx blood clotts  . Arthritis    Past Surgical History  Procedure Laterality Date  . Right leg    . Hip fracture surgery      RIGHT SIDE  . Shoulder surgery      RIGHT  . Cholecystectomy    . Eye surgery      BIL CATARACT   . Orif wrist fracture  10/13/2011    Procedure: OPEN REDUCTION INTERNAL FIXATION (ORIF) WRIST FRACTURE;  Surgeon: [name], MD;  Location: [place];  Service: Orthopedics;  Laterality: Right;  Open Reduction Internal Fixation Right Distal Radius   . Abdominal hysterectomy    . Btl    . Hardware removal  01/29/2012    Procedure: HARDWARE REMOVAL;  Surgeon: [name], MD;  Location: [place];  Service: Orthopedics;  Laterality: Right;  . Hip arthroplasty  01/29/2012    Procedure: ARTHROPLASTY BIPOLAR HIP;  Surgeon: [name], MD;  Location: [place];  Service: Orthopedics;  Laterality: Right;   Family History  Problem Relation Age of Onset  . Cancer - Lung Mother   . Coronary artery disease Mother   . Coronary artery disease Sister   . Coronary artery disease Brother    History   Social History  . Marital Status: Widowed    Spouse Name: N/A    Number of Children: N/A  . Years of Education: N/A   Occupational History  . Not on file.   Social History Main Topics  . Smoking status: Current Every Day Smoker -- 1.00 packs/day for 50 years    Types: Cigarettes  .  Smokeless tobacco: Not on  file  . Alcohol Use: No  . Drug Use: No  . Sexual Activity: Not on file   Other Topics Concern  . Not on file   Social History Narrative  . No narrative on file    Review of Systems: Review of Systems  Constitutional: Positive for malaise/fatigue. Negative for fever.  Eyes: Negative for blurred vision.  Respiratory: Positive for cough, sputum production and shortness of breath. Negative for wheezing.   Cardiovascular: Negative for chest pain, orthopnea and PND.  Gastrointestinal: Negative for nausea, vomiting, abdominal pain, diarrhea and constipation.  Genitourinary: Negative for dysuria and frequency.  Musculoskeletal: Negative for falls and myalgias.  Neurological: Positive for weakness. Negative for dizziness, loss of consciousness and headaches.    Physical Exam: Blood pressure 143/59, pulse 70, temperature 97.8 F (36.6 C), temperature source Oral, resp. rate 22, height 5\' 4"  (1.626 m), weight 115 lb (52.164 kg), SpO2 100.00%. General: alert, cooperative, and in no apparent distress, speaking in sentences without difficulty HEENT: NCAT, vision grossly intact, oropharynx clear and non-erythematous  Neck: supple, no lymphadenopathy Lungs: decreased air movement bilaterally, normal work of respiration, no wheezes, rales, ronchi Heart: regular rate and rhythm, no murmurs, gallops, or rubs Abdomen: soft, non-tender, non-distended, normal bowel sounds Extremities: 2+ DP/PT pulses bilaterally, no cyanosis, clubbing, or edema Neurologic: alert & oriented X3, cranial nerves II-XII intact, strength grossly intact, sensation intact to light touch   Lab results: Basic Metabolic Panel:  Recent Labs  07/08/13 1615  NA 140  K 4.3  CL 97  CO2 29  GLUCOSE 103*  BUN 22  CREATININE 0.83  CALCIUM 9.0   Liver Function Tests:  Recent Labs  07/08/13 1615  AST 16  ALT 10  ALKPHOS 64  BILITOT 0.3  PROT 7.5  ALBUMIN 3.5   CBC:  Recent Labs  07/08/13 1615  WBC 8.2    NEUTROABS 5.8  HGB 11.3*  HCT 38.9  MCV 71.5*  PLT 116*    Imaging results:  Dg Chest 2 View  07/08/2013   CLINICAL DATA:  Shortness of breath and chest pain with history of COPD and pneumonia and previous MI  EXAM: CHEST  2 VIEW  COMPARISON:  CT ANGIO CHEST W/CM &/OR WO/CM dated 02/01/2012; DG CHEST 2 VIEW dated 01/31/2012; DG CHEST 1V PORT dated 01/29/2012  FINDINGS: The lungs are hyperinflated. There is no focal infiltrate. There is apical scarring bilaterally most notably on the right. This is not new. There is mild retraction of the hilar structures superiorly. The cardiopericardial silhouette is normal in size. The pulmonary vascularity is not engorged. The mediastinum is normal in width. There is mild dextro correction levoscoliosis of the mid thoracic spine. There is no pleural effusion or pneumothorax. The observed portions of the bony thorax appear normal.  IMPRESSION: There is hyperinflation consistent with COPD. There is no focal pneumonia nor evidence of CHF. Persistently increased density in the right upper lobe superiorly is demonstrated but not greatly changed from previous studies. If the patient's symptoms persist or remain unexplained, follow-up chest CT scanning may be useful.   Electronically Signed   By: David  Martinique   On: 07/08/2013 17:39    Other results: EKG: pending, normal sinus rhythm on telemetry monitor  Assessment & Plan by Problem: #COPD exacerbation- Patient presented with shortness of breath, cough productive of white sputum, both worsening over the past week.  She reportedly has history of COPD on 2L home O2; however, she has never  seen a pulmonologist, has never had PFTs, and is not on COPD meds other than albuterol nebulizer at home.  Afebrile, VSS, no leucocytosis, satting 100% on 2L (88% on arrival in ED).  On exam, decreased air movement bilaterally but no wheezing or crackles; patient not tahcypneic and speaking easily in full sentences.  CXR showed  hyperinflation c/w COPD but no focal pneumonia or evidence of CHF; persistently increased density in RUL but unchanged from prior studies.  PE also considered but Well's score 0, symptoms better explained by COPD exacerbation.  -admit to IMTS telemetry  -duonebs q6h, q2h prn  -prednisone 40 mg daily for now with taper at discharge -doxcycline 100 mg BID  -acetaminophen q6h prn, Tramadol 50 mg q6h prn pain -PT/OT evals  -consider initiation of Spiriva, Advair at discharge -pulmonology follow-up outpatient    #HTN- Currently normotensive, goal for age <150/90.  Continue home carvedilol 25 mg BID, diltiazem 240 mg daily, digoxin 0.25 mg daily.  Holding home Lasix 40 mg daily for now.    #History of paroxysmal atrial fibrillation- Patient is on digoxin, diltiazem, and metoprolol since hospital discharge (for new onset paroxysmal atrial fibrillation) in 01/2012 per Dr. Nadyne Coombes.  Also on Xarelto at home, currently in normal sinus rhythm.  Will continue to monitor on telemetry.   #Diet- heart healthy  #DVT PPX- on Xarelto  #Code status- Full code   Dispo: Disposition is deferred at this time, awaiting improvement of current medical problems. Anticipated discharge in approximately 1-2 day(s).   The patient does have a current PCP (Leonides Sake, MD) and does need an [facility] hospital follow-up appointment after discharge.   Signed: Ivin Poot, MD 07/08/2013, 7:56 PM

## 2013-07-08 NOTE — ED Provider Notes (Signed)
CSN: [id number]     Arrival date & time 07/08/13  1529 History   First MD Initiated Contact with Patient 07/08/13 1644     Chief Complaint  Patient presents with  . Shortness of Breath  . Chest Pain     (Consider location/radiation/quality/duration/timing/severity/associated sxs/prior Treatment) HPI Comments: Pt with h/o COPD on home O2 of 2 L by [place], has had worsening chest tightness and sharp CP along with occasionally productive cough with white sputum, chills, no fevers for 2 weeks.  Pt has been using albuterol nebulizer at home with no sig relief.  Pt is not on singulair or advair.  She was given some flonase without sig relief, hasn't been able to sleep due to the symptoms and requested evaluation in the hospital.  No syncope, dizziness, no N/V.  CP made worse by coughing.    Patient is a 73 y.o. female presenting with shortness of breath and chest pain. The history is provided by the patient and a relative.  Shortness of Breath Associated symptoms: chest pain and cough   Associated symptoms: no abdominal pain, no fever and no vomiting   Chest Pain Associated symptoms: cough and shortness of breath   Associated symptoms: no abdominal pain, no back pain, no fever, no nausea and not vomiting     Past Medical History  Diagnosis Date  . Hypertension   . Myocardial infarction   . Shortness of breath   . Asthma   . COPD (chronic obstructive pulmonary disease)     emphysema    . Pneumonia     hx pneumonia ,chronic bronchitis   . H/O blood clots   . Chronic kidney disease     current   UTI   being treated  . GERD (gastroesophageal reflux disease)   . Headache(784.0)     hx migraines  . Blood dyscrasia     hx blood clotts  . Arthritis    Past Surgical History  Procedure Laterality Date  . Right leg    . Hip fracture surgery      RIGHT SIDE  . Shoulder surgery      RIGHT  . Cholecystectomy    . Eye surgery      BIL CATARACT   . Orif wrist fracture  10/13/2011   Procedure: OPEN REDUCTION INTERNAL FIXATION (ORIF) WRIST FRACTURE;  Surgeon: [name] Killings, MD;  Location: [place];  Service: Orthopedics;  Laterality: Right;  Open Reduction Internal Fixation Right Distal Radius   . Abdominal hysterectomy    . Btl    . Hardware removal  01/29/2012    Procedure: HARDWARE REMOVAL;  Surgeon: [name], MD;  Location: [place];  Service: Orthopedics;  Laterality: Right;  . Hip arthroplasty  01/29/2012    Procedure: ARTHROPLASTY BIPOLAR HIP;  Surgeon: [name], MD;  Location: [place];  Service: Orthopedics;  Laterality: Right;   Family History  Problem Relation Age of Onset  . Cancer - Lung Mother   . Coronary artery disease Mother   . Coronary artery disease Sister   . Coronary artery disease Brother    History  Substance Use Topics  . Smoking status: Current Every Day Smoker -- 1.00 packs/day for 50 years    Types: Cigarettes  . Smokeless tobacco: Not on file  . Alcohol Use: No   OB History   Grav Para Term Preterm Abortions TAB SAB Ect Mult Living  Review of Systems  Constitutional: Positive for chills. Negative for fever.  HENT: Positive for congestion, rhinorrhea and sinus pressure.   Respiratory: Positive for cough, chest tightness and shortness of breath.   Cardiovascular: Positive for chest pain.  Gastrointestinal: Negative for nausea, vomiting and abdominal pain.  Musculoskeletal: Negative for back pain.  All other systems reviewed and are negative.     Allergies  Review of patient's allergies indicates no known allergies.  Home Medications   Prior to Admission medications   Medication Sig Start Date End Date Taking? Authorizing Provider  albuterol (PROVENTIL) (2.5 MG/3ML) 0.083% nebulizer solution Take 2.5 mg by nebulization every 6 (six) hours.   Yes Historical Provider, MD  Aspirin-Caffeine (BAYER BACK & BODY PAIN EX ST PO) Take 1 tablet by mouth 2 (two) times daily as needed (pain).   Yes Historical Provider, MD   atorvastatin (LIPITOR) 40 MG tablet Take 40 mg by mouth daily.  07/02/13  Yes Historical Provider, MD  digoxin (LANOXIN) 0.25 MG tablet Take 0.25 mg by mouth daily.   Yes Historical Provider, MD  diltiazem (DILACOR XR) 240 MG 24 hr capsule Take 240 mg by mouth daily.  06/29/13  Yes Historical Provider, MD  docusate sodium (COLACE) 100 MG capsule Take 100 mg by mouth daily as needed (constipation).    Yes Historical Provider, MD  fish oil-omega-3 fatty acids 1000 MG capsule Take 1 g by mouth daily.   Yes Historical Provider, MD  fluticasone (FLONASE) 50 MCG/ACT nasal spray Place 1 spray into both nostrils daily.  07/04/13  Yes Historical Provider, MD  furosemide (LASIX) 40 MG tablet Take 40 mg by mouth daily.   Yes Historical Provider, MD  guaiFENesin (MUCINEX) 600 MG 12 hr tablet Take 600 mg by mouth once.   Yes Historical Provider, MD  loperamide (IMODIUM) 2 MG capsule Take 2 mg by mouth 2 (two) times daily as needed for diarrhea or loose stools.    Yes Historical Provider, MD  metoprolol succinate (TOPROL-XL) 50 MG 24 hr tablet Take 25 mg by mouth daily. Take with or immediately following a meal.   Yes Historical Provider, MD  neomycin-bacitracin-polymyxin (NEOSPORIN) OINT Apply 1 application topically 3 (three) times daily as needed for wound care.   Yes Historical Provider, MD  OVER THE COUNTER MEDICATION Take 1 tablet by mouth at bedtime. Hyland's Leg Cramps PM for sleep and leg cramps   Yes Historical Provider, MD  oxybutynin (DITROPAN) 5 MG tablet Take 10 mg by mouth at bedtime.  06/12/13  Yes Historical Provider, MD  pantoprazole (PROTONIX) 40 MG tablet Take 40 mg by mouth daily.  06/12/13  Yes Historical Provider, MD  rivaroxaban (XARELTO) 20 MG TABS tablet Take 20 mg by mouth daily with supper.   Yes Historical Provider, MD  traMADol (ULTRAM) 50 MG tablet Take 50-100 mg by mouth 2 (two) times daily as needed (pain).    Yes Historical Provider, MD  vitamin B-12 (CYANOCOBALAMIN) 500 MCG tablet  Take 500 mcg by mouth daily.   Yes Historical Provider, MD   BP 143/59  Pulse 70  Temp(Src) 97.8 F (36.6 C) (Oral)  Resp 22  Ht 5\' 4"  (1.626 m)  Wt 115 lb (52.164 kg)  BMI 19.73 kg/m2  SpO2 100% Physical Exam  Nursing note and vitals reviewed. Constitutional: She is oriented to person, place, and time. She appears well-developed.  HENT:  Head: Normocephalic and atraumatic.  Eyes: Conjunctivae and EOM are normal. No scleral icterus.  Neck: Normal range of motion.  Neck supple.  Cardiovascular: Normal rate, regular rhythm and intact distal pulses.   Pulmonary/Chest: Accessory muscle usage present. Tachypnea noted. She has decreased breath sounds. She has wheezes. She has no rhonchi. She has no rales.  Abdominal: Soft. She exhibits no distension.  Musculoskeletal: She exhibits no edema.  Neurological: She is alert and oriented to person, place, and time.  Skin: Skin is warm. No rash noted.  Psychiatric: She has a normal mood and affect.    ED Course  Procedures (including critical care time) Labs Review Labs Reviewed  CBC WITH DIFFERENTIAL - Abnormal; Notable for the following:    RBC 5.44 (*)    Hemoglobin 11.3 (*)    MCV 71.5 (*)    MCH 20.8 (*)    MCHC 29.0 (*)    RDW 18.9 (*)    Platelets 116 (*)    Monocytes Relative 14 (*)    Monocytes Absolute 1.1 (*)    All other components within normal limits  COMPREHENSIVE METABOLIC PANEL - Abnormal; Notable for the following:    Glucose, Bld 103 (*)    GFR calc non Af Amer 69 (*)    GFR calc Af Amer 80 (*)    All other components within normal limits  URINALYSIS, ROUTINE W REFLEX MICROSCOPIC    Imaging Review Dg Chest 2 View  07/08/2013   CLINICAL DATA:  Shortness of breath and chest pain with history of COPD and pneumonia and previous MI  EXAM: CHEST  2 VIEW  COMPARISON:  CT ANGIO CHEST W/CM &/OR WO/CM dated 02/01/2012; DG CHEST 2 VIEW dated 01/31/2012; DG CHEST 1V PORT dated 01/29/2012  FINDINGS: The lungs are  hyperinflated. There is no focal infiltrate. There is apical scarring bilaterally most notably on the right. This is not new. There is mild retraction of the hilar structures superiorly. The cardiopericardial silhouette is normal in size. The pulmonary vascularity is not engorged. The mediastinum is normal in width. There is mild dextro correction levoscoliosis of the mid thoracic spine. There is no pleural effusion or pneumothorax. The observed portions of the bony thorax appear normal.  IMPRESSION: There is hyperinflation consistent with COPD. There is no focal pneumonia nor evidence of CHF. Persistently increased density in the right upper lobe superiorly is demonstrated but not greatly changed from previous studies. If the patient's symptoms persist or remain unexplained, follow-up chest CT scanning may be useful.   Electronically Signed   By: David  Martinique   On: 07/08/2013 17:39     EKG Interpretation None     O2 sat is 97% and I interpret to be adequate  7:13 PM Pt reports no sig relief from first neb.  Not moving air much better.  Will give additional neb and reassess.  CXR shows no acute infiltrate or edema.     7:37 PM Pt continues to have exp wheezing, chest tightness and SOB.  O2 sats even on O2 will dip into the low 90-91%.  Will discuss with medicine for admission.     MDM   Final diagnoses:  Acute exacerbation of chronic obstructive pulmonary disease (COPD)  Respiratory distress    Pt with cough, decreased air movement, likely COPD exacerbation, possibly due to allergies.      Saddie Benders. Dorna Mai, MD 07/08/13 1943

## 2013-07-08 NOTE — ED Notes (Signed)
Pt presents to department for evaluation of SOB, chest pain and cough x1 week. Speaking complete sentences. 5/10 pain at the time. Pt is conscious alert and oriented x4.

## 2013-07-08 NOTE — ED Notes (Signed)
Admitting physicians at bedside.

## 2013-07-08 NOTE — ED Notes (Signed)
Ghim, MD at bedside. 

## 2013-07-08 NOTE — ED Notes (Signed)
Family at bedside. 

## 2013-07-08 NOTE — ED Notes (Signed)
Ghim, MD is aware of the pt's blood pressure and consents to the pt going to the floor. This RN to start a fluid bolus.

## 2013-07-09 DIAGNOSIS — J441 Chronic obstructive pulmonary disease with (acute) exacerbation: Secondary | ICD-10-CM

## 2013-07-09 DIAGNOSIS — I10 Essential (primary) hypertension: Secondary | ICD-10-CM

## 2013-07-09 LAB — TROPONIN I: Troponin I: <0.3 ng/mL (ref <0.30)

## 2013-07-09 MED ORDER — ENSURE COMPLETE PO LIQD: 237.0000 mL | ORAL | Status: DC

## 2013-07-09 MED ORDER — DOXYCYCLINE HYCLATE 100 MG PO TABS: 100.0000 mg | ORAL_TABLET | Freq: Two times a day (BID) | ORAL | Status: DC

## 2013-07-09 MED ORDER — PREDNISONE 20 MG PO TABS: 40.0000 mg | ORAL_TABLET | Freq: Every day | ORAL | Status: DC

## 2013-07-09 MED ORDER — IPRATROPIUM-ALBUTEROL 0.5-2.5 (3) MG/3ML IN SOLN
3.0000 mL | Freq: Four times a day (QID) | RESPIRATORY_TRACT | Status: DC
  Administered 2013-07-09 (×2): 3 mL via RESPIRATORY_TRACT
  Filled 2013-07-09 (×2): qty 3

## 2013-07-09 NOTE — Progress Notes (Signed)
Subjective: Patient feeling much improved today. She notes her breathing and cough are almost back to baseline. She is still bringing up some clear sputum, which is somewhat more than her usual amount. No chest pain overnight. Denies abd pain, N/V/D.  Objective: Vital signs in last 24 hours: Filed Vitals:   07/08/13 2140 07/09/13 0151 07/09/13 0412 07/09/13 0530  BP: 135/68 128/42  137/54  Pulse: 79 68  69  Temp: 97.4 F (36.3 C) 97.5 F (36.4 C)  97.5 F (36.4 C)  TempSrc: Oral Oral  Oral  Resp: 19 18  18   Height: 5\' 5"  (1.651 m)     Weight: 112 lb 9.6 oz (51.075 kg)  111 lb 3.2 oz (50.44 kg)   SpO2: 96% 97%  100%   Weight change:  No intake or output data in the 24 hours ending 07/09/13 0727  Physical Exam General: alert, cooperative, and in no apparent distress, breathing comfortably, sitting upright in bed HEENT: NCAT, vision grossly intact, oropharynx clear and non-erythematous  Neck: supple, no lymphadenopathy Lungs: decreased air movement bilaterally, normal WOB, mild expiratory wheeze noted to middle L lung field, but otherwise CTAB Heart: RRR Abdomen: soft, non-tender, non-distended, normal bowel sounds  Extremities: warm, no pedal edema Neurologic: alert & oriented X3, cranial nerves II-XII intact, moving all extremities spontaneously  Lab Results: Basic Metabolic Panel:  Recent Labs Lab 07/08/13 1615  NA 140  K 4.3  CL 97  CO2 29  GLUCOSE 103*  BUN 22  CREATININE 0.83  CALCIUM 9.0  MG 1.8   Liver Function Tests:  Recent Labs Lab 07/08/13 1615  AST 16  ALT 10  ALKPHOS 64  BILITOT 0.3  PROT 7.5  ALBUMIN 3.5   CBC:  Recent Labs Lab 07/08/13 1615  WBC 8.2  NEUTROABS 5.8  HGB 11.3*  HCT 38.9  MCV 71.5*  PLT 116*   Micro Results: No results found for this or any previous visit (from the past 240 hour(s)). Studies/Results: Dg Chest 2 View  07/08/2013   CLINICAL DATA:  Shortness of breath and chest pain with history of COPD and  pneumonia and previous MI  EXAM: CHEST  2 VIEW  COMPARISON:  CT ANGIO CHEST W/CM &/OR WO/CM dated 02/01/2012; DG CHEST 2 VIEW dated 01/31/2012; DG CHEST 1V PORT dated 01/29/2012  FINDINGS: The lungs are hyperinflated. There is no focal infiltrate. There is apical scarring bilaterally most notably on the right. This is not new. There is mild retraction of the hilar structures superiorly. The cardiopericardial silhouette is normal in size. The pulmonary vascularity is not engorged. The mediastinum is normal in width. There is mild dextro correction levoscoliosis of the mid thoracic spine. There is no pleural effusion or pneumothorax. The observed portions of the bony thorax appear normal.  IMPRESSION: There is hyperinflation consistent with COPD. There is no focal pneumonia nor evidence of CHF. Persistently increased density in the right upper lobe superiorly is demonstrated but not greatly changed from previous studies. If the patient's symptoms persist or remain unexplained, follow-up chest CT scanning may be useful.   Electronically Signed   By: David  Martinique   On: 07/08/2013 17:39   Medications: I have reviewed the patient's current medications. Scheduled Meds: . albuterol  2.5 mg Nebulization Q6H  . atorvastatin  40 mg Oral q1800  . digoxin  0.25 mg Oral Daily  . diltiazem  240 mg Oral Daily  . doxycycline  100 mg Oral Q12H  . fluticasone  1 spray Each  Nare Daily  . ipratropium  0.5 mg Nebulization Q6H  . metoprolol succinate  25 mg Oral Daily  . oxybutynin  10 mg Oral QHS  . pantoprazole  40 mg Oral QAC breakfast  . predniSONE  40 mg Oral Q breakfast  . rivaroxaban  20 mg Oral Q supper   Continuous Infusions:  PRN Meds:.acetaminophen, albuterol, traMADol Assessment/Plan:  #COPD exacerbation- Breathing improved. Satting well on her home dose of supplemental oxygen. Remains afebrile with rest VSS. EKG stable from prior. Troponin pending, will need to wait for this to result prior to making  descision about discharge, however likely discharge today as she is very stable.  -continue duonebs q6h   -prednisone 40mg  daily -doxycycline 100mg  BID -PT/OT eval today- she is stable for evaluation despite the order for EKG and troponin -will need outpatient f/u with pulmonology as well as initiation of spiriva/advair at discharge  #HTN-Stable, normotensive. Continue home carvedilol 25 mg BID, diltiazem 240 mg daily, digoxin 0.25 mg daily. Holding home Lasix 40 mg daily for now.   #History of paroxysmal atrial fibrillation- Patient is on digoxin, diltiazem, and metoprolol since hospital discharge (for new onset paroxysmal atrial fibrillation) in 01/2012 per Dr. Nadyne Coombes. Also on Xarelto at home, currently in normal sinus rhythm. Will continue to monitor on telemetry.  -continue home medications  #Diet- heart healthy   #DVT PPX- on Xarelto   #Code status- Full code  Dispo: Discharge likely today  The patient does have a current PCP (Leonides Sake, MD) and does not need an [facility] hospital follow-up appointment after discharge.  The patient does not have transportation limitations that hinder transportation to clinic appointments.  .Services Needed at time of discharge: Y = Yes, Blank = No PT:   OT:   RN:   Equipment:   Other:     LOS: 1 day   Rebecca Eaton, MD 07/09/2013, 7:27 AM

## 2013-07-09 NOTE — Evaluation (Signed)
Occupational Therapy Evaluation Patient Details Name: [name] [name] MRN: [id number] DOB: 09/24/1940 Today's Date: 07/09/2013    History of Present Illness Adm 4/26 with COPD exacerbation. PMHx- Rt hip fx (later with THR), COPD, MI   Clinical Impression   Pt is Mod I with ADLs and ADL mobility and no further acute OT services are indicated at this time    Follow Up Recommendations  No OT follow up;Supervision - Intermittent    Equipment Recommendations   none   Recommendations for Other Services  none     Precautions / Restrictions Precautions Precautions: Fall Restrictions Weight Bearing Restrictions: No      Mobility Bed Mobility Overal bed mobility: Modified Independent                Transfers Overall transfer level: Modified independent Equipment used: 1 person hand held assist             General transfer comment: from bed x2, from [facility]; no unsteadiness noted; proper sequencing with AD    Balance Overall balance assessment: History of Falls                                          ADL Overall ADL's : Modified independent                                             Vision  wears glasses                   Perception Perception Perception Tested?: No   Praxis Praxis Praxis tested?: Not tested    Pertinent Vitals/Pain No c/o pain, VSS     Hand Dominance Left   Extremity/Trunk Assessment Upper Extremity Assessment Upper Extremity Assessment: Overall WFL for tasks assessed   Lower Extremity Assessment Lower Extremity Assessment: Defer to PT evaluation   Cervical / Trunk Assessment Cervical / Trunk Assessment: Normal   Communication Communication Communication: HOH   Cognition Arousal/Alertness: Awake/alert Behavior During Therapy: WFL for tasks assessed/performed Overall Cognitive Status: Within Functional Limits for tasks assessed                     General Comments   Pt  very pleasant and cooperative                 Home Living Family/patient expects to be discharged to:: Private residence Living Arrangements: Alone Available Help at Discharge: Family;Available PRN/intermittently;Other (Comment) Type of Home: Apartment Home Access: Level entry     Home Layout: One level     Bathroom Shower/Tub: Teacher, early years/pre: Standard     Home Equipment: Environmental consultant - 4 wheels;Bedside commode;Shower seat;Adaptive equipment Adaptive Equipment: Reacher;Sock aid;Long-handled shoe horn;Long-handled sponge        Prior Functioning/Environment Level of Independence: Needs assistance    ADL's / Homemaking Assistance Needed: children do the cleaning; she cooks and is independent wiht bathing and dressing        OT Diagnosis:     OT Problem List:     OT Treatment/Interventions:      OT Goals(Current goals can be found in the care plan section)    OT Frequency:     Barriers to D/C:    none  End of Session Equipment Utilized During Treatment: Gait belt;Other (comment)  Activity Tolerance: Patient tolerated treatment well Patient left: in bed;with call bell/phone within reach;with bed alarm set   Time: 5397-6734 OT Time Calculation (min): 19 min Charges:  OT General Charges $OT Visit: 1 Procedure OT Evaluation $Initial OT Evaluation Tier I: 1 Procedure OT Treatments $Therapeutic Activity: 8-22 mins G-Codes:    [name] [name] 07-16-2013, 2:57 PM

## 2013-07-09 NOTE — Progress Notes (Signed)
OT Cancellation Note  Patient Details Name: [name] [name] MRN: [id number] DOB: [birth year]   Cancelled Treatment:    Reason Eval/Treat Not Completed: Patient not medically ready. Noted orders for EKG and stat Troponin I written this a.m. No results as of yet. Will proceed with OT later today as time allows/medically appropriate.    Mosetta Putt, OT                                                                             Mosetta Putt 07/09/2013, 9:46 AM

## 2013-07-09 NOTE — Progress Notes (Signed)
PT Cancellation Note  Patient Details Name: [name] [name] MRN: [id number] DOB: 10-Nov-1940   Cancelled Treatment:    Reason Eval/Treat Not Completed: Patient not medically ready. Noted orders for EKG and stat Troponin I written this a.m. No results as of yet. Will proceed with PT eval once results known if medically appropriate.   Jeanie Cooks [place] Savage 07/09/2013, 8:49 AM Pager [phone]

## 2013-07-09 NOTE — Progress Notes (Signed)
INITIAL NUTRITION ASSESSMENT  DOCUMENTATION CODES Per approved criteria  -Underweight   INTERVENTION: Provide Ensure Complete once daily Encourage PO intake, discussed ways to increase calorie intake  NUTRITION DIAGNOSIS: Unintented weight loss related to aging and prolonged catabolic illness as evidenced by COPD and 11% weight loss over 2 years.   Goal: Pt to meet >/= 90% of their estimated nutrition needs   Monitor:  PO intake, weight trend, labs  Reason for Assessment: Consult  73 y.o. female  Admitting Dx: COPD exacerbation  ASSESSMENT: 73 year old woman with history of COPD on 2L home O2, paroxysmal atrial fibrillation on Xarelto, HTN who presents with shortness of breath and cough.   Pt states that she used to weigh 125 lbs 2 years ago and she has gradually lost down to 112 lbs for unknown reasons. Pt states she has a good appetite and eats 3 meals daily. She gets meal on wheels and always eats 100%. Encouraged pt to continue eating 3 meals daily. Discussed ways to add calories to diet. Encouraged pt to add one snack daily.  Pt eating lunch at time of visit and reports eating 100% of breakfast this morning. Pt with moderate wasting of arms and clavicles and mild wasting of temples and orbital region.   Height: Ht Readings from Last 1 Encounters:  07/08/13 5\' 5"  (1.651 m)    Weight: Wt Readings from Last 1 Encounters:  07/09/13 111 lb 3.2 oz (50.44 kg)    Ideal Body Weight: 125 lbs  % Ideal Body Weight: 89%  Wt Readings from Last 10 Encounters:  07/09/13 111 lb 3.2 oz (50.44 kg)  02/03/12 118 lb 6.2 oz (53.7 kg)  02/03/12 116 lb (52.617 kg)  02/03/12 116 lb (52.617 kg)  10/12/11 115 lb (52.164 kg)    Usual Body Weight: 125 lbs  % Usual Body Weight: 89%  BMI:  Body mass index is 18.5 kg/(m^2). (Underweight)  Estimated Nutritional Needs: Kcal: 1400-1600 Protein: 70-80 grams Fluid: 1.5 L/day  Skin: intact  Diet Order: Cardiac  EDUCATION  NEEDS: -No education needs identified at this time   Intake/Output Summary (Last 24 hours) at 07/09/13 1306 Last data filed at 07/09/13 0826  Gross per 24 hour  Intake    360 ml  Output      0 ml  Net    360 ml    Last BM: 4/26   Labs:   Recent Labs Lab 07/08/13 1615  NA 140  K 4.3  CL 97  CO2 29  BUN 22  CREATININE 0.83  CALCIUM 9.0  MG 1.8  GLUCOSE 103*    CBG (last 3)  No results found for this basename: GLUCAP,  in the last 72 hours  Scheduled Meds: . atorvastatin  40 mg Oral q1800  . digoxin  0.25 mg Oral Daily  . diltiazem  240 mg Oral Daily  . doxycycline  100 mg Oral Q12H  . fluticasone  1 spray Each Nare Daily  . ipratropium-albuterol  3 mL Nebulization Q6H  . metoprolol succinate  25 mg Oral Daily  . oxybutynin  10 mg Oral QHS  . pantoprazole  40 mg Oral QAC breakfast  . predniSONE  40 mg Oral Q breakfast  . rivaroxaban  20 mg Oral Q supper    Continuous Infusions:   Past Medical History  Diagnosis Date  . Hypertension   . Myocardial infarction   . Shortness of breath   . Asthma   . COPD (chronic obstructive pulmonary disease)  emphysema    . Pneumonia     hx pneumonia ,chronic bronchitis   . H/O blood clots   . Chronic kidney disease     current   UTI   being treated  . GERD (gastroesophageal reflux disease)   . Headache(784.0)     hx migraines  . Blood dyscrasia     hx blood clotts  . Arthritis     Past Surgical History  Procedure Laterality Date  . Right leg    . Hip fracture surgery      RIGHT SIDE  . Shoulder surgery      RIGHT  . Cholecystectomy    . Eye surgery      BIL CATARACT   . Orif wrist fracture  10/13/2011    Procedure: OPEN REDUCTION INTERNAL FIXATION (ORIF) WRIST FRACTURE;  Surgeon: [name], MD;  Location: [place];  Service: Orthopedics;  Laterality: Right;  Open Reduction Internal Fixation Right Distal Radius   . Abdominal hysterectomy    . Btl    . Hardware removal  01/29/2012    Procedure:  HARDWARE REMOVAL;  Surgeon: [name], MD;  Location: [place];  Service: Orthopedics;  Laterality: Right;  . Hip arthroplasty  01/29/2012    Procedure: ARTHROPLASTY BIPOLAR HIP;  Surgeon: [name], MD;  Location: [place];  Service: Orthopedics;  Laterality: Right;    Pryor Ochoa RD, LDN Inpatient Clinical Dietitian Pager: [phone] After Hours Pager: [phone]

## 2013-07-09 NOTE — Discharge Summary (Signed)
Name: [name] [name] MRN: [id number] DOB: [birth year] 73 y.o. PCP: Leonides Sake, MD  Date of Admission: 07/08/2013  3:45 PM Date of Discharge: 07/09/2013 Attending Physician: Madilyn Fireman, MD  Discharge Diagnosis: Principal Problem:   COPD exacerbation Active Problems:   CAD (coronary artery disease)   Hypertension   COPD (chronic obstructive pulmonary disease)   Paroxysmal atrial fibrillation   COPD with acute exacerbation  Discharge Medications:   Medication List         albuterol (2.5 MG/3ML) 0.083% nebulizer solution  Commonly known as:  PROVENTIL  Take 2.5 mg by nebulization every 6 (six) hours.     atorvastatin 40 MG tablet  Commonly known as:  LIPITOR  Take 40 mg by mouth daily.     BAYER BACK & BODY PAIN EX ST PO  Take 1 tablet by mouth 2 (two) times daily as needed (pain).     digoxin 0.25 MG tablet  Commonly known as:  LANOXIN  Take 0.25 mg by mouth daily.     diltiazem 240 MG 24 hr capsule  Commonly known as:  DILACOR XR  Take 240 mg by mouth daily.     docusate sodium 100 MG capsule  Commonly known as:  COLACE  Take 100 mg by mouth daily as needed (constipation).     doxycycline 100 MG tablet  Commonly known as:  VIBRA-TABS  Take 1 tablet (100 mg total) by mouth every 12 (twelve) hours.     fish oil-omega-3 fatty acids 1000 MG capsule  Take 1 g by mouth daily.     fluticasone 50 MCG/ACT nasal spray  Commonly known as:  FLONASE  Place 1 spray into both nostrils daily.     furosemide 40 MG tablet  Commonly known as:  LASIX  Take 40 mg by mouth daily.     guaiFENesin 600 MG 12 hr tablet  Commonly known as:  MUCINEX  Take 600 mg by mouth once.     loperamide 2 MG capsule  Commonly known as:  IMODIUM  Take 2 mg by mouth 2 (two) times daily as needed for diarrhea or loose stools.     metoprolol succinate 50 MG 24 hr tablet  Commonly known as:  TOPROL-XL  Take 25 mg by mouth daily. Take with or immediately following a meal.     neomycin-bacitracin-polymyxin Oint  Commonly known as:  NEOSPORIN  Apply 1 application topically 3 (three) times daily as needed for wound care.     OVER THE COUNTER MEDICATION  Take 1 tablet by mouth at bedtime. Hyland's Leg Cramps PM for sleep and leg cramps     oxybutynin 5 MG tablet  Commonly known as:  DITROPAN  Take 10 mg by mouth at bedtime.     pantoprazole 40 MG tablet  Commonly known as:  PROTONIX  Take 40 mg by mouth daily.     predniSONE 20 MG tablet  Commonly known as:  DELTASONE  Take 2 tablets (40 mg total) by mouth daily with breakfast.     rivaroxaban 20 MG Tabs tablet  Commonly known as:  XARELTO  Take 20 mg by mouth daily with supper.     traMADol 50 MG tablet  Commonly known as:  ULTRAM  Take 50-100 mg by mouth 2 (two) times daily as needed (pain).     vitamin B-12 500 MCG tablet  Commonly known as:  CYANOCOBALAMIN  Take 500 mcg by mouth daily.       Disposition and follow-up:  [name] [name] was discharged from [facility] in Stable condition.  At the hospital follow up visit please address:  1.  COPD exacerbation- please assess compliance w/ prednisone and doxycycline; no hx of PFTs- would benefit from referral to pulmonology for PFTs; also would likely benefit from spiriva/advair addition to her regimen  2.  Labs / imaging needed at time of follow-up: none  3.  Pending labs/ test needing follow-up: none  Follow-up Appointments: Follow-up Information   Follow up with [facility], MD. Schedule an appointment as soon as possible for a visit on 07/16/2013. (@2 :30 pm spoke with Jackelyn Poling )    Specialty:  Family Medicine   Contact information:   Dr. Daiva Eves [place] [address] [phone]       Discharge Instructions: Discharge Orders   Future Orders Complete By Expires   Call MD for:  difficulty breathing, headache or visual disturbances  As directed    Call MD for:  extreme fatigue  As  directed    Call MD for:  temperature >100.4  As directed    Diet - low sodium heart healthy  As directed    Increase activity slowly  As directed       Consultations:  none  Procedures Performed:  Dg Chest 2 View  07/08/2013   CLINICAL DATA:  Shortness of breath and chest pain with history of COPD and pneumonia and previous MI  EXAM: CHEST  2 VIEW  COMPARISON:  CT ANGIO CHEST W/CM &/OR WO/CM dated 02/01/2012; DG CHEST 2 VIEW dated 01/31/2012; DG CHEST 1V PORT dated 01/29/2012  FINDINGS: The lungs are hyperinflated. There is no focal infiltrate. There is apical scarring bilaterally most notably on the right. This is not new. There is mild retraction of the hilar structures superiorly. The cardiopericardial silhouette is normal in size. The pulmonary vascularity is not engorged. The mediastinum is normal in width. There is mild dextro correction levoscoliosis of the mid thoracic spine. There is no pleural effusion or pneumothorax. The observed portions of the bony thorax appear normal.  IMPRESSION: There is hyperinflation consistent with COPD. There is no focal pneumonia nor evidence of CHF. Persistently increased density in the right upper lobe superiorly is demonstrated but not greatly changed from previous studies. If the patient's symptoms persist or remain unexplained, follow-up chest CT scanning may be useful.   Electronically Signed   By: David  Martinique   On: 07/08/2013 17:39   Admission HPI:  [name] [name] is a 73 year old woman with history of ?COPD on 2L home O2, paroxysmal atrial fibrillation on Xarelto, HTN who presents with shortness of breath and cough.  Patient states she has had worsening shortness of breath, worsening cough productive of white sputum x 1 week. She has been using her albuterol nebulizer 4x daily with little relief; she also takes Flonase and Mucinex at home but no other COPD meds. She also has associated chest tightness and costochrondral chest pain only with coughing. She  smoked 2 packs/day x 40+ years but quit last week when she started not feeling well, intends for this to be permanent. She has been on home O2 since 2013 after hospitalization for a fall. She has never seen a pulmonologist or had PFTs. Denies fever, abdominal pain, nausea/vomiting, lightheadedness, LOC; no sick contacts.  Patient lives alone at home. Her son and his wife visit her daily and take her to grocery store, etc. She walks around her apartment complex using walker  but has not been able to be active for the past week due to feeling poorly. She has a home health RN through [place] who helps her with medications, and had home health PT after hospitalization for fall in 2013 but has since been discharged. She follows regularly with PCP Dr. Lisbeth Ply and Dr. Einar Gip (cardiology) annually.  In ED, patient received duonebs and prednisone 40 mg with some improvement of symptoms. IMTS subsequently called for admission.   Hospital Course by problem list:   #COPD exacerbation- Patient presented with shortness of breath, cough productive of white sputum, both worsening over the past week. She reportedly has history of COPD on 2L home O2; however, she has never seen a pulmonologist, has never had PFTs, and is not on COPD meds other than albuterol nebulizer at home. Afebrile, VSS, no leucocytosis, satting 100% on 2L (88% on arrival in ED). On exam, decreased air movement bilaterally but no wheezing or crackles; patient not tahcypneic and speaking easily in full sentences. CXR showed hyperinflation c/w COPD but no focal pneumonia or evidence of CHF; persistently increased density in RUL but unchanged from prior studies. Troponin negative x 1. EKG no change from prior. Pt was monitored overnight and given duonebs q6h scheduled. She was given prednisode 40mg  daily (discharged with 4 more days of this dose) as well as doxycycline 100mg  BID (also given 4 more days of this medication at discharge). PT evaluated patient  and recommened HH PT, so this was arranged prior to discharge. Patient follows with a PCP and will need close PCP follow up. I attempted to make an appointment for patient, though I was unable to get through on the phone to her PCP. Patient was instructed to call PCP's office to make an appointment in 1-2 weeks. Pt would likely benefit from Alexander, so please consider starting this as outpatient. She would also likely benefit from pulmonology referral for PFTs. On morning of discharge, patient's breathing was approximately at baseline and she remained stable.   #HTN- Remained normotensive, goal for age <150/90. Continue home carvedilol 25 mg BID, diltiazem 240 mg daily, digoxin 0.25 mg daily. Holding home Lasix 40 mg daily during her admission, but this was restarted at discharge along with all of her other home antihypertensives.   #History of paroxysmal atrial fibrillation- Patient is on digoxin, diltiazem, and metoprolol since hospital discharge (for new onset paroxysmal atrial fibrillation) in 01/2012 per Dr. Nadyne Coombes. Also on Xarelto at home. Pt in NSR while hospitalized. Continued home medications while inpatient and at discharge.   Discharge Vitals:   BP 132/50  Pulse 69  Temp(Src) 97.5 F (36.4 C) (Oral)  Resp 16  Ht 5\' 5"  (1.651 m)  Wt 111 lb 3.2 oz (50.44 kg)  BMI 18.50 kg/m2  SpO2 100%  Discharge Labs:  Results for orders placed during the hospital encounter of 07/08/13 (from the past 24 hour(s))  CBC WITH DIFFERENTIAL     Status: Abnormal   Collection Time    07/08/13  4:15 PM      Result Value Ref Range   WBC 8.2  4.0 - 10.5 K/uL   RBC 5.44 (*) 3.87 - 5.11 MIL/uL   Hemoglobin 11.3 (*) 12.0 - 15.0 g/dL   HCT 38.9  36.0 - 46.0 %   MCV 71.5 (*) 78.0 - 100.0 fL   MCH 20.8 (*) 26.0 - 34.0 pg   MCHC 29.0 (*) 30.0 - 36.0 g/dL   RDW 18.9 (*) 11.5 - 15.5 %   Platelets  116 (*) 150 - 400 K/uL   Neutrophils Relative % 71  43 - 77 %   Neutro Abs 5.8  1.7 - 7.7 K/uL   Lymphocytes  Relative 15  12 - 46 %   Lymphs Abs 1.3  0.7 - 4.0 K/uL   Monocytes Relative 14 (*) 3 - 12 %   Monocytes Absolute 1.1 (*) 0.1 - 1.0 K/uL   Eosinophils Relative 0  0 - 5 %   Eosinophils Absolute 0.0  0.0 - 0.7 K/uL   Basophils Relative 0  0 - 1 %   Basophils Absolute 0.0  0.0 - 0.1 K/uL  COMPREHENSIVE METABOLIC PANEL     Status: Abnormal   Collection Time    07/08/13  4:15 PM      Result Value Ref Range   Sodium 140  137 - 147 mEq/L   Potassium 4.3  3.7 - 5.3 mEq/L   Chloride 97  96 - 112 mEq/L   CO2 29  19 - 32 mEq/L   Glucose, Bld 103 (*) 70 - 99 mg/dL   BUN 22  6 - 23 mg/dL   Creatinine, Ser 0.83  0.50 - 1.10 mg/dL   Calcium 9.0  8.4 - 10.5 mg/dL   Total Protein 7.5  6.0 - 8.3 g/dL   Albumin 3.5  3.5 - 5.2 g/dL   AST 16  0 - 37 U/L   ALT 10  0 - 35 U/L   Alkaline Phosphatase 64  39 - 117 U/L   Total Bilirubin 0.3  0.3 - 1.2 mg/dL   GFR calc non Af Amer 69 (*) >90 mL/min   GFR calc Af Amer 80 (*) >90 mL/min  MAGNESIUM     Status: None   Collection Time    07/08/13  4:15 PM      Result Value Ref Range   Magnesium 1.8  1.5 - 2.5 mg/dL  TROPONIN I     Status: None   Collection Time    07/09/13 10:55 AM      Result Value Ref Range   Troponin I <0.30  <0.30 ng/mL    Signed: Rebecca Eaton, MD 07/09/2013, 1:32 PM   Time Spent on Discharge:  35 minutes Services Ordered on Discharge: none Equipment Ordered on Discharge: none

## 2013-07-09 NOTE — Care Management Note (Signed)
    Page 1 of 1   07/09/2013     11:19:23 AM CARE MANAGEMENT NOTE 07/09/2013  Patient:  [name] [name], [name] [name]   Account Number:  [id number]  Date Initiated:  07/09/2013  Documentation initiated by:  [facility]  Subjective/Objective Assessment:   73 year old woman with history of COPD on 2L home O2, paroxysmal atrial fibrillation on Xarelto, HTN who presents with shortness of breath and cough. //Home alone with [facility] services     Action/Plan:   admit to IMTS telemetry; duonebs q6h, q2h prn; prednisone 40 mg daily for now with taper at discharge; doxcycline 100 mg BID//Access for additional HH needs   Anticipated DC Date:  07/12/2013   Anticipated DC Plan:  [place]  CM consult      [facility] Choice  Resumption Of Svcs/PTA Provider  HOME HEALTH   Choice offered to / List presented to:  C-1 Patient        Tees Toh arranged  HH-1 RN  [place] agency  [place]   Status of service:  In process, will continue to follow Medicare Important Message given?   (If response is "NO", the following Medicare IM given date fields will be blank) Date Medicare IM given:   Date Additional Medicare IM given:    Discharge Disposition:    Per UR Regulation:  Reviewed for med. necessity/level of care/duration of stay  If discussed at [place] of Stay Meetings, dates discussed:    Comments:  07/09/13 [place], RN, BSN, NCM Pt currently active with [employer] for [employer]. Resumption of care requested.  Rhett Bannister of [place] notified of pt admission.  No DME needs identified at this time.

## 2013-07-09 NOTE — Discharge Instructions (Signed)
Chronic Obstructive Pulmonary Disease  Chronic obstructive pulmonary disease (COPD) is a common lung condition in which airflow from the lungs is limited. COPD is a general term that can be used to describe many different lung problems that limit airflow, including both chronic bronchitis and emphysema.  If you have COPD, your lung function will probably never return to normal, but there are measures you can take to improve lung function and make yourself feel better.   CAUSES   · Smoking (common).    · Exposure to secondhand smoke.    · Genetic problems.  · Chronic inflammatory lung diseases or recurrent infections.  SYMPTOMS   · Shortness of breath, especially with physical activity.    · Deep, persistent (chronic) cough with a large amount of thick mucus.    · Wheezing.    · Rapid breaths (tachypnea).    · Gray or bluish discoloration (cyanosis) of the skin, especially in fingers, toes, or lips.    · Fatigue.    · Weight loss.    · Frequent infections or episodes when breathing symptoms become much worse (exacerbations).    · Chest tightness.  DIAGNOSIS   Your healthcare provider will take a medical history and perform a physical examination to make the initial diagnosis.  Additional tests for COPD may include:   · Lung (pulmonary) function tests.  · Chest X-ray.  · CT scan.  · Blood tests.  TREATMENT   Treatment available to help you feel better when you have COPD include:   · Inhaler and nebulizer medicines. These help manage the symptoms of COPD and make your breathing more comfortable  · Supplemental oxygen. Supplemental oxygen is only helpful if you have a low oxygen level in your blood.    · Exercise and physical activity. These are beneficial for nearly all people with COPD. Some people may also benefit from a pulmonary rehabilitation program.  HOME CARE INSTRUCTIONS   · Take all medicines (inhaled or pills) as directed by your health care provider.  · Only take over-the-counter or prescription medicines  for pain, fever, or discomfort as directed by your health care provider.    · Avoid over-the-counter medicines or cough syrups that dry up your airway (such as antihistamines) and slow down the elimination of secretions unless instructed otherwise by your healthcare provider.    · If you are a smoker, the most important thing that you can do is stop smoking. Continuing to smoke will cause further lung damage and breathing trouble. Ask your health care provider for help with quitting smoking. He or she can direct you to community resources or hospitals that provide support.  · Avoid exposure to irritants such as smoke, chemicals, and fumes that aggravate your breathing.  · Use oxygen therapy and pulmonary rehabilitation if directed by your health care provider. If you require home oxygen therapy, ask your healthcare provider whether you should purchase a pulse oximeter to measure your oxygen level at home.    · Avoid contact with individuals who have a contagious illness.  · Avoid extreme temperature and humidity changes.  · Eat healthy foods. Eating smaller, more frequent meals and resting before meals may help you maintain your strength.  · Stay active, but balance activity with periods of rest. Exercise and physical activity will help you maintain your ability to do things you want to do.  · Preventing infection and hospitalization is very important when you have COPD. Make sure to receive all the vaccines your health care provider recommends, especially the pneumococcal and influenza vaccines. Ask your healthcare provider whether you   need a pneumonia vaccine.  · Learn and use relaxation techniques to manage stress.  · Learn and use controlled breathing techniques as directed by your health care provider. Controlled breathing techniques include:    · Pursed lip breathing. Start by breathing in (inhaling) through your nose for 1 second. Then, purse your lips as if you were going to whistle and breathe out (exhale)  through the pursed lips for 2 seconds.    · Diaphragmatic breathing. Start by putting one hand on your abdomen just above your waist. Inhale slowly through your nose. The hand on your abdomen should move out. Then purse your lips and exhale slowly. You should be able to feel the hand on your abdomen moving in as you exhale.    · Learn and use controlled coughing to clear mucus from your lungs. Controlled coughing is a series of short, progressive coughs. The steps of controlled coughing are:    1. Lean your head slightly forward.    2. Breathe in deeply using diaphragmatic breathing.    3. Try to hold your breath for 3 seconds.    4. Keep your mouth slightly open while coughing twice.    5. Spit any mucus out into a tissue.    6. Rest and repeat the steps once or twice as needed.  SEEK MEDICAL CARE IF:   · You are coughing up more mucus than usual.    · There is a change in the color or thickness of your mucus.    · Your breathing is more labored than usual.    · Your breathing is faster than usual.    SEEK IMMEDIATE MEDICAL CARE IF:   · You have shortness of breath while you are resting.    · You have shortness of breath that prevents you from:  · Being able to talk.    · Performing your usual physical activities.    · You have chest pain lasting longer than 5 minutes.    · Your skin color is more cyanotic than usual.  · You measure low oxygen saturations for longer than 5 minutes with a pulse oximeter.  MAKE SURE YOU:   · Understand these instructions.  · Will watch your condition.  · Will get help right away if you are not doing well or get worse.  Document Released: 12/09/2004 Document Revised: 12/20/2012 Document Reviewed: 10/26/2012  Exit[employer]® Patient Information ©2014 [employer].

## 2013-07-09 NOTE — H&P (Signed)
INTERNAL MEDICINE TEACHING ATTENDING NOTE  Day 1 of stay  Patient name: [name] [name]  MRN: [id number] Date of birth: [birth year]   73 y.o. lady with COPD exacerbation. She has been started on steroids and she shows improvement. When I met her this morning, she was sitting up in bed, no distress, breathing normally. Her vitals have been stable. Her lungs are clear to auscultation with no wheeze, and somewhat decreased air movement.   I have read the chart, seen and reviewed labs and imaging. I have read Dr. Stann Mainland HP and Dr Renaldo Fiddler note from today and I agree with their documentation. I think it is ok to discharge this patient today with PCP follow up soon.  I have seen and evaluated this patient and discussed it with my IM resident team.  Please see the rest of the plan per resident note from today.   Zyrus Hetland 07/09/2013, 12:04 PM.

## 2013-07-09 NOTE — Progress Notes (Signed)
discharge instructions given to pt and daughter il law. Both verbalized understanding 1720 wheeled to lobby by Nt

## 2013-07-09 NOTE — Evaluation (Signed)
Physical Therapy Evaluation Patient Details Name: [name] [name] MRN: [id number] DOB: 05/10/1940 Today's Date: 07/09/2013   History of Present Illness  Adm 4/26 with COPD exacerbation. PMHx- Rt hip fx (later with THR), COPD, MI  Clinical Impression  Patient evaluated by Physical Therapy with no further acute PT needs identified. Pt reports recent decline in her balance which warrants a HHPT safety evaluation. See below for any follow-up Physial Therapy or equipment needs. PT is signing off. Thank you for this referral.     Follow Up Recommendations Home health PT;Supervision - Intermittent    Equipment Recommendations  None recommended by PT    Recommendations for Other Services       Precautions / Restrictions Precautions Precautions: Fall      Mobility  Bed Mobility Overal bed mobility: Modified Independent                Transfers Overall transfer level: Modified independent Equipment used: Rolling walker (2 wheeled)             General transfer comment: from bed x2, from [facility]; no unsteadiness noted; proper sequencing with AD  Ambulation/Gait Ambulation/Gait assistance: Supervision Ambulation Distance (Feet): 180 Feet Assistive device:  (push w/c with portable O2) Gait Pattern/deviations: WFL(Within Functional Limits)   Gait velocity interpretation: at or above normal speed for age/gender General Gait Details: reports she goes slow because she is afraid she will fall; has not fallen in > 1 yr  Stairs            Wheelchair Mobility    Modified Rankin (Stroke Patients Only)       Balance Overall balance assessment: History of Falls                                           Pertinent Vitals/Pain Denied pain SaO2 93-97% on 2L O2 (as she uses at home) while up walking    Home Living Family/patient expects to be discharged to:: Private residence Living Arrangements: Alone Available Help at Discharge: Family;Available  PRN/intermittently;Other (Comment) [facility] check vitals, set out meds; children drive her) Type of Home: Apartment Home Access: Level entry     Home Layout: One level Home Equipment: Walker - 4 wheels;Bedside commode;Shower seat (wants a wheelchair for community use)      Prior Function Level of Independence: Needs assistance      ADL's / Homemaking Assistance Needed: children do the cleaning; she cooks        Hand Dominance        Extremity/Trunk Assessment   Upper Extremity Assessment: Defer to OT evaluation           Lower Extremity Assessment: Overall WFL for tasks assessed      Cervical / Trunk Assessment: Normal  Communication   Communication: HOH  Cognition Arousal/Alertness: Awake/alert Behavior During Therapy: WFL for tasks assessed/performed Overall Cognitive Status: Within Functional Limits for tasks assessed                      General Comments General comments (skin integrity, edema, etc.): Pt reports she feels her balance has gotten worse and would like for HHPT to work with her.    Exercises        Assessment/Plan    PT Assessment All further PT needs can be met in the next venue of care  PT Diagnosis Difficulty walking  PT Problem List Decreased balance  PT Treatment Interventions     PT Goals (Current goals can be found in the Care Plan section) Acute Rehab PT Goals PT Goal Formulation: No goals set, d/c therapy    Frequency     Barriers to discharge        Co-evaluation               End of Session Equipment Utilized During Treatment: Gait belt;Oxygen Activity Tolerance: Patient tolerated treatment well Patient left: in chair;with call bell/phone within reach;with chair alarm set Nurse Communication: Mobility status;Other (comment) (on chair alarm)         Time: 1127-1213 PT Time Calculation (min): 46 min   Charges:   PT Evaluation $Initial PT Evaluation Tier I: 1 Procedure PT Treatments $Gait  Training: 23-37 mins   PT G CodesJeanie Cooks Ingram Onnen 07-15-13, 12:26 PM Pager [phone]

## 2013-07-13 NOTE — Discharge Summary (Signed)
Reviewed. Agree with documentation. 

## 2013-08-03 ENCOUNTER — Emergency Department (HOSPITAL_COMMUNITY): Payer: Medicare Other

## 2013-08-03 ENCOUNTER — Inpatient Hospital Stay (HOSPITAL_COMMUNITY)
Admission: EM | Admit: 2013-08-03 | Discharge: 2013-08-06 | DRG: 690 | Disposition: A | Discharge status: Home / Self Care | Payer: Medicare Other | Attending: Internal Medicine | Admitting: Internal Medicine

## 2013-08-03 ENCOUNTER — Inpatient Hospital Stay (HOSPITAL_COMMUNITY): Payer: Medicare Other

## 2013-08-03 DIAGNOSIS — I480 Paroxysmal atrial fibrillation: Secondary | ICD-10-CM

## 2013-08-03 DIAGNOSIS — I4891 Unspecified atrial fibrillation: Secondary | ICD-10-CM

## 2013-08-03 DIAGNOSIS — J9610 Chronic respiratory failure, unspecified whether with hypoxia or hypercapnia: Secondary | ICD-10-CM | POA: Diagnosis present

## 2013-08-03 DIAGNOSIS — N189 Chronic kidney disease, unspecified: Secondary | ICD-10-CM | POA: Diagnosis present

## 2013-08-03 DIAGNOSIS — J438 Other emphysema: Secondary | ICD-10-CM | POA: Diagnosis present

## 2013-08-03 DIAGNOSIS — Z96649 Presence of unspecified artificial hip joint: Secondary | ICD-10-CM

## 2013-08-03 DIAGNOSIS — IMO0002 Reserved for concepts with insufficient information to code with codable children: Secondary | ICD-10-CM

## 2013-08-03 DIAGNOSIS — N3090 Cystitis, unspecified without hematuria: Principal | ICD-10-CM

## 2013-08-03 DIAGNOSIS — E785 Hyperlipidemia, unspecified: Secondary | ICD-10-CM | POA: Diagnosis present

## 2013-08-03 DIAGNOSIS — F17200 Nicotine dependence, unspecified, uncomplicated: Secondary | ICD-10-CM | POA: Diagnosis present

## 2013-08-03 DIAGNOSIS — Z9981 Dependence on supplemental oxygen: Secondary | ICD-10-CM

## 2013-08-03 DIAGNOSIS — D62 Acute posthemorrhagic anemia: Secondary | ICD-10-CM | POA: Diagnosis present

## 2013-08-03 DIAGNOSIS — I2510 Atherosclerotic heart disease of native coronary artery without angina pectoris: Secondary | ICD-10-CM

## 2013-08-03 DIAGNOSIS — N390 Urinary tract infection, site not specified: Secondary | ICD-10-CM

## 2013-08-03 DIAGNOSIS — M79609 Pain in unspecified limb: Secondary | ICD-10-CM | POA: Diagnosis present

## 2013-08-03 DIAGNOSIS — N3289 Other specified disorders of bladder: Secondary | ICD-10-CM | POA: Diagnosis not present

## 2013-08-03 DIAGNOSIS — N3091 Cystitis, unspecified with hematuria: Secondary | ICD-10-CM | POA: Diagnosis present

## 2013-08-03 DIAGNOSIS — I129 Hypertensive chronic kidney disease with stage 1 through stage 4 chronic kidney disease, or unspecified chronic kidney disease: Secondary | ICD-10-CM | POA: Diagnosis present

## 2013-08-03 DIAGNOSIS — I252 Old myocardial infarction: Secondary | ICD-10-CM

## 2013-08-03 DIAGNOSIS — J309 Allergic rhinitis, unspecified: Secondary | ICD-10-CM | POA: Diagnosis present

## 2013-08-03 DIAGNOSIS — K219 Gastro-esophageal reflux disease without esophagitis: Secondary | ICD-10-CM | POA: Diagnosis present

## 2013-08-03 DIAGNOSIS — I959 Hypotension, unspecified: Secondary | ICD-10-CM | POA: Diagnosis present

## 2013-08-03 DIAGNOSIS — Z79899 Other long term (current) drug therapy: Secondary | ICD-10-CM

## 2013-08-03 DIAGNOSIS — J449 Chronic obstructive pulmonary disease, unspecified: Secondary | ICD-10-CM

## 2013-08-03 DIAGNOSIS — I10 Essential (primary) hypertension: Secondary | ICD-10-CM

## 2013-08-03 DIAGNOSIS — J4489 Other specified chronic obstructive pulmonary disease: Secondary | ICD-10-CM

## 2013-08-03 DIAGNOSIS — N318 Other neuromuscular dysfunction of bladder: Secondary | ICD-10-CM | POA: Diagnosis present

## 2013-08-03 LAB — HEMOGLOBIN AND HEMATOCRIT, BLOOD
HEMATOCRIT: 24.1 % — AB (ref 36.0–46.0)
HEMOGLOBIN: 6.8 g/dL — AB (ref 12.0–15.0)

## 2013-08-03 LAB — CBC
HCT: 29.1 % — ABNORMAL LOW (ref 36.0–46.0)
HEMOGLOBIN: 7.9 g/dL — AB (ref 12.0–15.0)
MCH: 19.6 pg — AB (ref 26.0–34.0)
MCHC: 27.1 g/dL — ABNORMAL LOW (ref 30.0–36.0)
MCV: 72 fL — ABNORMAL LOW (ref 78.0–100.0)
Platelets: 154 10*3/uL (ref 150–400)
RBC: 4.04 MIL/uL (ref 3.87–5.11)
RDW: 20.6 % — ABNORMAL HIGH (ref 11.5–15.5)
WBC: 8.8 10*3/uL (ref 4.0–10.5)

## 2013-08-03 LAB — COMPREHENSIVE METABOLIC PANEL
ALK PHOS: 56 U/L (ref 39–117)
ALT: 11 U/L (ref 0–35)
AST: 16 U/L (ref 0–37)
Albumin: 3.1 g/dL — ABNORMAL LOW (ref 3.5–5.2)
BUN: 19 mg/dL (ref 6–23)
CO2: 35 meq/L — AB (ref 19–32)
Calcium: 8.7 mg/dL (ref 8.4–10.5)
Chloride: 99 mEq/L (ref 96–112)
Creatinine, Ser: 0.71 mg/dL (ref 0.50–1.10)
GFR calc Af Amer: >90 mL/min (ref >90)
GFR, EST NON AFRICAN AMERICAN: 84 mL/min — AB (ref >90)
GLUCOSE: 107 mg/dL — AB (ref 70–99)
POTASSIUM: 3.8 meq/L (ref 3.7–5.3)
SODIUM: 144 meq/L (ref 137–147)
Total Bilirubin: <0.2 mg/dL — ABNORMAL LOW (ref 0.3–1.2)
Total Protein: 6.4 g/dL (ref 6.0–8.3)

## 2013-08-03 LAB — URINALYSIS, ROUTINE W REFLEX MICROSCOPIC
Glucose, UA: NEGATIVE mg/dL
KETONES UR: 15 mg/dL — AB
Nitrite: POSITIVE — AB
PH: 7.5 (ref 5.0–8.0)
Protein, ur: >300 mg/dL — AB
Specific Gravity, Urine: 1.021 (ref 1.005–1.030)
Urobilinogen, UA: 1 mg/dL (ref 0.0–1.0)

## 2013-08-03 LAB — MRSA PCR SCREENING: MRSA by PCR: POSITIVE — AB

## 2013-08-03 LAB — PREPARE RBC (CROSSMATCH)

## 2013-08-03 LAB — APTT: aPTT: 33 seconds (ref 24–37)

## 2013-08-03 LAB — URINE MICROSCOPIC-ADD ON

## 2013-08-03 LAB — PROTIME-INR
INR: 1.36 (ref 0.00–1.49)
Prothrombin Time: 16.4 seconds — ABNORMAL HIGH (ref 11.6–15.2)

## 2013-08-03 MED ORDER — MORPHINE SULFATE 2 MG/ML IJ SOLN
2.0000 mg | Freq: Four times a day (QID) | INTRAMUSCULAR | Status: DC | PRN
  Administered 2013-08-03 – 2013-08-04 (×2): 2 mg via INTRAVENOUS
  Filled 2013-08-03 (×2): qty 1

## 2013-08-03 MED ORDER — LOPERAMIDE HCL 2 MG PO CAPS
2.0000 mg | ORAL_CAPSULE | Freq: Two times a day (BID) | ORAL | Status: DC | PRN
  Filled 2013-08-03: qty 1

## 2013-08-03 MED ORDER — DIGOXIN 250 MCG PO TABS
0.2500 mg | ORAL_TABLET | Freq: Every day | ORAL | Status: DC
  Administered 2013-08-04 – 2013-08-06 (×3): 0.25 mg via ORAL
  Filled 2013-08-03 (×3): qty 1

## 2013-08-03 MED ORDER — DOCUSATE SODIUM 100 MG PO CAPS
100.0000 mg | ORAL_CAPSULE | Freq: Every day | ORAL | Status: DC | PRN
  Filled 2013-08-03: qty 1

## 2013-08-03 MED ORDER — CYANOCOBALAMIN 500 MCG PO TABS
500.0000 ug | ORAL_TABLET | Freq: Every day | ORAL | Status: DC
  Administered 2013-08-04 – 2013-08-06 (×3): 500 ug via ORAL
  Filled 2013-08-03 (×3): qty 1

## 2013-08-03 MED ORDER — ALBUTEROL SULFATE (2.5 MG/3ML) 0.083% IN NEBU
2.5000 mg | INHALATION_SOLUTION | Freq: Four times a day (QID) | RESPIRATORY_TRACT | Status: DC
  Administered 2013-08-03: 2.5 mg via RESPIRATORY_TRACT
  Filled 2013-08-03: qty 3

## 2013-08-03 MED ORDER — ACETAMINOPHEN 325 MG PO TABS
650.0000 mg | ORAL_TABLET | Freq: Four times a day (QID) | ORAL | Status: DC | PRN
  Administered 2013-08-04: 650 mg via ORAL
  Filled 2013-08-03: qty 2

## 2013-08-03 MED ORDER — PREDNISONE 20 MG PO TABS
40.0000 mg | ORAL_TABLET | Freq: Every day | ORAL | Status: DC
  Filled 2013-08-03 (×2): qty 2

## 2013-08-03 MED ORDER — DEXTROSE 5 % IV SOLN
1.0000 g | Freq: Once | INTRAVENOUS | Status: AC
  Administered 2013-08-03: 1 g via INTRAVENOUS
  Filled 2013-08-03: qty 10

## 2013-08-03 MED ORDER — OXYBUTYNIN CHLORIDE 5 MG PO TABS
10.0000 mg | ORAL_TABLET | Freq: Every day | ORAL | Status: DC
  Administered 2013-08-04 – 2013-08-05 (×3): 10 mg via ORAL
  Filled 2013-08-03 (×5): qty 2

## 2013-08-03 MED ORDER — FLUTICASONE PROPIONATE 50 MCG/ACT NA SUSP
1.0000 | Freq: Every day | NASAL | Status: DC
  Administered 2013-08-04: 1 via NASAL
  Filled 2013-08-03: qty 16

## 2013-08-03 MED ORDER — CEFTRIAXONE SODIUM 1 G IJ SOLR: 1.0000 g | INTRAMUSCULAR | Status: DC

## 2013-08-03 MED ORDER — TRAMADOL HCL 50 MG PO TABS
50.0000 mg | ORAL_TABLET | Freq: Two times a day (BID) | ORAL | Status: DC | PRN
  Administered 2013-08-04 – 2013-08-05 (×3): 50 mg via ORAL
  Filled 2013-08-03 (×3): qty 1

## 2013-08-03 MED ORDER — ALBUTEROL SULFATE (2.5 MG/3ML) 0.083% IN NEBU: 2.5000 mg | INHALATION_SOLUTION | Freq: Four times a day (QID) | RESPIRATORY_TRACT | Status: DC

## 2013-08-03 MED ORDER — DILTIAZEM HCL ER 240 MG PO CP24
240.0000 mg | ORAL_CAPSULE | Freq: Every day | ORAL | Status: DC
  Administered 2013-08-04 – 2013-08-06 (×3): 240 mg via ORAL
  Filled 2013-08-03 (×4): qty 1

## 2013-08-03 MED ORDER — CHLORHEXIDINE GLUCONATE CLOTH 2 % EX PADS
6.0000 | MEDICATED_PAD | Freq: Every day | CUTANEOUS | Status: DC
  Administered 2013-08-04 – 2013-08-06 (×3): 6 via TOPICAL

## 2013-08-03 MED ORDER — MUPIROCIN 2 % EX OINT
1.0000 "application " | TOPICAL_OINTMENT | Freq: Two times a day (BID) | CUTANEOUS | Status: DC
  Administered 2013-08-04 – 2013-08-06 (×6): 1 via NASAL
  Filled 2013-08-03: qty 22

## 2013-08-03 MED ORDER — SODIUM CHLORIDE 0.9 % IV SOLN: 250.0000 mL | INTRAVENOUS | Status: DC | PRN

## 2013-08-03 MED ORDER — DEXTROSE 5 % IV SOLN
1.0000 g | INTRAVENOUS | Status: DC
  Administered 2013-08-04: 1 g via INTRAVENOUS
  Filled 2013-08-03 (×2): qty 10

## 2013-08-03 MED ORDER — ATORVASTATIN CALCIUM 40 MG PO TABS
40.0000 mg | ORAL_TABLET | Freq: Every day | ORAL | Status: DC
  Administered 2013-08-04 – 2013-08-06 (×3): 40 mg via ORAL
  Filled 2013-08-03 (×4): qty 1

## 2013-08-03 MED ORDER — SODIUM CHLORIDE 0.9 % IJ SOLN
3.0000 mL | Freq: Two times a day (BID) | INTRAMUSCULAR | Status: DC
  Administered 2013-08-04 – 2013-08-06 (×6): 3 mL via INTRAVENOUS

## 2013-08-03 MED ORDER — ACETAMINOPHEN 650 MG RE SUPP: 650.0000 mg | Freq: Four times a day (QID) | RECTAL | Status: DC | PRN

## 2013-08-03 MED ORDER — SODIUM CHLORIDE 0.9 % IJ SOLN: 3.0000 mL | INTRAMUSCULAR | Status: DC | PRN

## 2013-08-03 MED ORDER — MORPHINE SULFATE 2 MG/ML IJ SOLN
2.0000 mg | Freq: Once | INTRAMUSCULAR | Status: AC
  Administered 2013-08-03: 2 mg via INTRAVENOUS
  Filled 2013-08-03: qty 1

## 2013-08-03 MED ORDER — PANTOPRAZOLE SODIUM 40 MG PO TBEC
40.0000 mg | DELAYED_RELEASE_TABLET | Freq: Every day | ORAL | Status: DC
  Administered 2013-08-04 – 2013-08-06 (×3): 40 mg via ORAL
  Filled 2013-08-03 (×3): qty 1

## 2013-08-03 MED ORDER — OMEGA-3-ACID ETHYL ESTERS 1 G PO CAPS
1.0000 g | ORAL_CAPSULE | Freq: Every day | ORAL | Status: DC
  Administered 2013-08-05 – 2013-08-06 (×2): 1 g via ORAL
  Filled 2013-08-03 (×4): qty 1

## 2013-08-03 NOTE — H&P (Signed)
[facility] History and Physical  ARNETA [name] [id number] DOB: 04-08-1940 DOA: 08/03/2013  Referring physician:  PCP: Leonides Sake, MD  Specialists:   Chief Complaint: Vaginal bleeding  HPI: [name] [name] is a 73 y.o. female  With a history of COPD, hypertension, that presents emergency department with complaints of vaginal bleeding. Patient also complains painful urination for approximately one week. Patient called EMS this morning because she thought she had blood all associated with her bowel movement, as the toilet was filled with very dark blood. Patient states this has been ongoing for approximately 2-3 days. Arrival to emergency department, it was thought that patient may have had a GI bleed. Upon examination in the emergency department, patient blood clots from her urethra. A Foley catheter was placed, with bladder irrigation. Patient currently denies any chest pain, shortness of breath, abdominal pain. Per her son, she does not like to go to the doctor, therefore she "sits on things."  Review of Systems:  Constitutional: Denies fever, chills, diaphoresis, appetite change and fatigue.  HEENT: Denies photophobia, eye pain, redness, hearing loss, ear pain, congestion, sore throat, rhinorrhea, sneezing, mouth sores, trouble swallowing, neck pain, neck stiffness and tinnitus.   Respiratory: Denies SOB, DOE, cough, chest tightness,  and wheezing.   Cardiovascular: Denies chest pain, palpitations and leg swelling.  Gastrointestinal: Denies nausea, vomiting, abdominal pain, diarrhea, constipation, blood in stool and abdominal distention.  Genitourinary: Complains of painful urination for approximately one week, complains of vaginal bleeding.  Musculoskeletal: Has chronic pain, complains of right foot pain. Skin: Denies pallor, rash and wound.  Neurological: Denies dizziness, seizures, syncope, weakness, light-headedness, numbness and headaches.  Hematological: Denies  adenopathy. Easy bruising, personal or family bleeding history  Psychiatric/Behavioral: Denies suicidal ideation, mood changes, confusion, nervousness, sleep disturbance and agitation  Past Medical History  Diagnosis Date  . Hypertension   . Myocardial infarction   . Shortness of breath   . Asthma   . COPD (chronic obstructive pulmonary disease)     emphysema    . Pneumonia     hx pneumonia ,chronic bronchitis   . H/O blood clots   . Chronic kidney disease     current   UTI   being treated  . GERD (gastroesophageal reflux disease)   . Headache(784.0)     hx migraines  . Blood dyscrasia     hx blood clotts  . Arthritis    Past Surgical History  Procedure Laterality Date  . Right leg    . Hip fracture surgery      RIGHT SIDE  . Shoulder surgery      RIGHT  . Cholecystectomy    . Eye surgery      BIL CATARACT   . Orif wrist fracture  10/13/2011    Procedure: OPEN REDUCTION INTERNAL FIXATION (ORIF) WRIST FRACTURE;  Surgeon: [name], MD;  Location: [place];  Service: Orthopedics;  Laterality: Right;  Open Reduction Internal Fixation Right Distal Radius   . Abdominal hysterectomy    . Btl    . Hardware removal  01/29/2012    Procedure: HARDWARE REMOVAL;  Surgeon: [name], MD;  Location: [place];  Service: Orthopedics;  Laterality: Right;  . Hip arthroplasty  01/29/2012    Procedure: ARTHROPLASTY BIPOLAR HIP;  Surgeon: [name], MD;  Location: [place];  Service: Orthopedics;  Laterality: Right;   Social History:  reports that she has been smoking Cigarettes.  She has a 50 pack-year smoking history. She does  not have any smokeless tobacco history on file. She reports that she does not drink alcohol or use illicit drugs. Lives at home alone.  No Known Allergies  Family History  Problem Relation Age of Onset  . Cancer - Lung Mother   . Coronary artery disease Mother   . Coronary artery disease Sister   . Coronary artery disease Brother     Prior to Admission  medications   Medication Sig Start Date End Date Taking? Authorizing Provider  albuterol (PROVENTIL) (2.5 MG/3ML) 0.083% nebulizer solution Take 2.5 mg by nebulization every 6 (six) hours.   Yes Historical Provider, MD  Aspirin-Caffeine (BAYER BACK & BODY PAIN EX ST PO) Take 1 tablet by mouth 2 (two) times daily as needed (pain).   Yes Historical Provider, MD  atorvastatin (LIPITOR) 40 MG tablet Take 40 mg by mouth daily.  07/02/13  Yes Historical Provider, MD  digoxin (LANOXIN) 0.25 MG tablet Take 0.25 mg by mouth daily.   Yes Historical Provider, MD  diltiazem (DILACOR XR) 240 MG 24 hr capsule Take 240 mg by mouth daily.  06/29/13  Yes Historical Provider, MD  docusate sodium (COLACE) 100 MG capsule Take 100 mg by mouth daily as needed (constipation).    Yes Historical Provider, MD  fish oil-omega-3 fatty acids 1000 MG capsule Take 1 g by mouth daily.   Yes Historical Provider, MD  fluticasone (FLONASE) 50 MCG/ACT nasal spray Place 1 spray into both nostrils daily.  07/04/13  Yes Historical Provider, MD  furosemide (LASIX) 40 MG tablet Take 40 mg by mouth daily.   Yes Historical Provider, MD  loperamide (IMODIUM) 2 MG capsule Take 2 mg by mouth 2 (two) times daily as needed for diarrhea or loose stools.    Yes Historical Provider, MD  metoprolol succinate (TOPROL-XL) 50 MG 24 hr tablet Take 25 mg by mouth daily. Take with or immediately following a meal.   Yes Historical Provider, MD  neomycin-bacitracin-polymyxin (NEOSPORIN) OINT Apply 1 application topically 3 (three) times daily as needed for wound care.   Yes Historical Provider, MD  Omega-3 Fatty Acids (FISH OIL) 1200 MG CAPS Take 1,200 mg by mouth daily.   Yes Historical Provider, MD  OVER THE COUNTER MEDICATION Take 1 tablet by mouth at bedtime. Hyland's Leg Cramps PM for sleep and leg cramps   Yes Historical Provider, MD  oxybutynin (DITROPAN) 5 MG tablet Take 10 mg by mouth at bedtime.  06/12/13  Yes Historical Provider, MD  pantoprazole  (PROTONIX) 40 MG tablet Take 40 mg by mouth daily.  06/12/13  Yes Historical Provider, MD  predniSONE (DELTASONE) 20 MG tablet Take 2 tablets (40 mg total) by mouth daily with breakfast. 07/09/13  Yes Rebecca Eaton, MD  rivaroxaban (XARELTO) 20 MG TABS tablet Take 20 mg by mouth daily with supper.   Yes Historical Provider, MD  traMADol (ULTRAM) 50 MG tablet Take 50-100 mg by mouth 2 (two) times daily as needed (pain).    Yes Historical Provider, MD  vitamin B-12 (CYANOCOBALAMIN) 500 MCG tablet Take 500 mcg by mouth daily.   Yes Historical Provider, MD   Physical Exam: Filed Vitals:   08/03/13 1200  BP: 112/44  Pulse: 67  Temp:   Resp: 15     General: Well developed, well nourished, NAD, appears stated age  HEENT: NCAT, PERRLA, EOMI, Anicteic Sclera, mucous membranes moist.   Neck: Supple, no JVD, no masses  Cardiovascular: S1 S2 auscultated, 2/6 SEM, Regular rate and rhythm.  Respiratory: Clear to  auscultation bilaterally with equal chest rise  Abdomen: Soft, nontender, nondistended, + bowel sounds  GU: Foley catheter in place with frank blood  Extremities: warm dry without cyanosis clubbing or edema  Neuro: AAOx3, cranial nerves grossly intact. Strength 5/5 in patient's upper and lower extremities bilaterally  Skin: Without rashes exudates or nodules  Psych: Normal affect and demeanor with intact judgement and insight  Labs on Admission:  Basic Metabolic Panel:  Recent Labs Lab 08/03/13 0921  NA 144  K 3.8  CL 99  CO2 35*  GLUCOSE 107*  BUN 19  CREATININE 0.71  CALCIUM 8.7   Liver Function Tests:  Recent Labs Lab 08/03/13 0921  AST 16  ALT 11  ALKPHOS 56  BILITOT <0.2*  PROT 6.4  ALBUMIN 3.1*   No results found for this basename: LIPASE, AMYLASE,  in the last 168 hours No results found for this basename: AMMONIA,  in the last 168 hours CBC:  Recent Labs Lab 08/03/13 0921  WBC 8.8  HGB 7.9*  HCT 29.1*  MCV 72.0*  PLT 154   Cardiac  Enzymes: No results found for this basename: CKTOTAL, CKMB, CKMBINDEX, TROPONINI,  in the last 168 hours  BNP (last 3 results) No results found for this basename: PROBNP,  in the last 8760 hours CBG: No results found for this basename: GLUCAP,  in the last 168 hours  Radiological Exams on Admission: Dg Chest Port 1 View  08/03/2013   CLINICAL DATA:  Urethral bleeding, GI bleeding, hypertension, coronary artery disease post MI, COPD  EXAM: PORTABLE CHEST - 1 VIEW  COMPARISON:  Portable exam 0931 hr compared to 07/08/2013  FINDINGS: Normal heart size, mediastinal contours, and pulmonary vascularity.  Calcified tortuous thoracic aorta.  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  No acute infiltrate, pleural effusion or pneumothorax.  Bones demineralized.  Old posttraumatic deformity proximal LEFT humerus.  IMPRESSION: Emphysematous changes with biapical scarring.  No acute abnormalities.   Electronically Signed   By: Lavonia Dana M.D.   On: 08/03/2013 09:50    EKG: None  Assessment/Plan  Anemia secondary to acute blood loss/hemorrhagic cystitis -Patient will be admitted to step down unit. -Will continue to monitor H&H every 12 hours. -Patient's baseline hemoglobin appears to be 10.5-11, currently 7.9. -Patient does have chronic blood loss and a Foley catheter bag. -This appears to be secondary to hemorrhagic cystitis. -Patient had undergone irrigation in the emergency department. -Will transfuse as patient's hemoglobin drops below 7.  Hemorrhagic cystitis/urinary tract infection -UA: Positive nitrites, moderate leukocytes, 7-10 white blood cells -Pending urine culture -Will place patient on ceftriaxone -Currently afebrile, no leukocytosis. -Spoke with Dr. Matilde Sprang, urology.  Patient may be referred as an outpatient, supportive care for now.   Hypotension -Patient is normally hypertensive. Will hold her antihypertensive  medications at this time.  -Will monitor patient and step down overnight. -If needed may give blood or bolus patient keep her blood pressure stable. -Currently asymptomatic.  Paroxysmal atrial fibrillation -Currently rate and rhythm control. -Continue digoxin and diltiazem. -Will hold xarelto due to anemia with active bleeding  Overactive bladder -Will continue oxybutynin  Right foot pain -Will obtain x-ray of the right foot.  COPD -Currently compensated, continue nebulizer treatments  Hyperlipidemia -Continue statin  DVT prophylaxis:  SCDs  Code Status: Full  Condition: Guarded  Family Communication: Son at bedside. Admission, patients condition and plan of care including tests being ordered have been discussed with the patient and son  who indicate understanding and agree with the plan and Code Status.  Disposition Plan: Admitted   Time spent: 60 minutes  [place] D.O. [facility] Pager [phone]  If 7PM-7AM, please contact night-coverage [email or web address] Password [email or web address] 08/03/2013, 2:01 PM

## 2013-08-03 NOTE — ED Provider Notes (Addendum)
CSN: [id number]     Arrival date & time 08/03/13  0906 History   First MD Initiated Contact with Patient 08/03/13 [phone]     Chief Complaint  Patient presents with  . Vaginal Bleeding     (Consider location/radiation/quality/duration/timing/severity/associated sxs/prior Treatment) HPI Comments: Patient brought to the ER for evaluation of bleeding. Patient called EMS this morning because she had a bowel movement and thinks that there was blood in it this morning. She says that it "filled up the toilet" and was very dark. EMS responded to the home where she lives alone. He reports as they were taking vital signs she had sudden output of bright red blood, felt to possibly be from her rectum. They estimate approximately 100 mL of blood. Patient has had stable vital signs during transport. She is on oxygen at home continuously. She is not experiencing any chest pain or shortness of breath currently. She does report that she is having pain in the area of her vagina. She also tells me that over the past couple of days she has had urinary frequency and burning with urination.   Past Medical History  Diagnosis Date  . Hypertension   . Myocardial infarction   . Shortness of breath   . Asthma   . COPD (chronic obstructive pulmonary disease)     emphysema    . Pneumonia     hx pneumonia ,chronic bronchitis   . H/O blood clots   . Chronic kidney disease     current   UTI   being treated  . GERD (gastroesophageal reflux disease)   . Headache(784.0)     hx migraines  . Blood dyscrasia     hx blood clotts  . Arthritis    Past Surgical History  Procedure Laterality Date  . Right leg    . Hip fracture surgery      RIGHT SIDE  . Shoulder surgery      RIGHT  . Cholecystectomy    . Eye surgery      BIL CATARACT   . Orif wrist fracture  10/13/2011    Procedure: OPEN REDUCTION INTERNAL FIXATION (ORIF) WRIST FRACTURE;  Surgeon: [name], MD;  Location: [place];  Service: Orthopedics;  Laterality:  Right;  Open Reduction Internal Fixation Right Distal Radius   . Abdominal hysterectomy    . Btl    . Hardware removal  01/29/2012    Procedure: HARDWARE REMOVAL;  Surgeon: [name], MD;  Location: [place];  Service: Orthopedics;  Laterality: Right;  . Hip arthroplasty  01/29/2012    Procedure: ARTHROPLASTY BIPOLAR HIP;  Surgeon: [name], MD;  Location: [place];  Service: Orthopedics;  Laterality: Right;   Family History  Problem Relation Age of Onset  . Cancer - Lung Mother   . Coronary artery disease Mother   . Coronary artery disease Sister   . Coronary artery disease Brother    History  Substance Use Topics  . Smoking status: Current Every Day Smoker -- 1.00 packs/day for 50 years    Types: Cigarettes  . Smokeless tobacco: Not on file  . Alcohol Use: No   OB History   Grav Para Term Preterm Abortions TAB SAB Ect Mult Living                 Review of Systems  Respiratory: Negative for shortness of breath.   Cardiovascular: Positive for chest pain.  Gastrointestinal: Positive for anal bleeding. Negative for abdominal pain.  Genitourinary:  Positive for vaginal pain.  All other systems reviewed and are negative.     Allergies  Review of patient's allergies indicates no known allergies.  Home Medications   Prior to Admission medications   Medication Sig Start Date End Date Taking? Authorizing Provider  albuterol (PROVENTIL) (2.5 MG/3ML) 0.083% nebulizer solution Take 2.5 mg by nebulization every 6 (six) hours.    Historical Provider, MD  Aspirin-Caffeine (BAYER BACK & BODY PAIN EX ST PO) Take 1 tablet by mouth 2 (two) times daily as needed (pain).    Historical Provider, MD  atorvastatin (LIPITOR) 40 MG tablet Take 40 mg by mouth daily.  07/02/13   Historical Provider, MD  digoxin (LANOXIN) 0.25 MG tablet Take 0.25 mg by mouth daily.    Historical Provider, MD  diltiazem (DILACOR XR) 240 MG 24 hr capsule Take 240 mg by mouth daily.  06/29/13   Historical Provider,  MD  docusate sodium (COLACE) 100 MG capsule Take 100 mg by mouth daily as needed (constipation).     Historical Provider, MD  doxycycline (VIBRA-TABS) 100 MG tablet Take 1 tablet (100 mg total) by mouth every 12 (twelve) hours. 07/09/13   Rebecca Eaton, MD  fish oil-omega-3 fatty acids 1000 MG capsule Take 1 g by mouth daily.    Historical Provider, MD  fluticasone (FLONASE) 50 MCG/ACT nasal spray Place 1 spray into both nostrils daily.  07/04/13   Historical Provider, MD  furosemide (LASIX) 40 MG tablet Take 40 mg by mouth daily.    Historical Provider, MD  guaiFENesin (MUCINEX) 600 MG 12 hr tablet Take 600 mg by mouth once.    Historical Provider, MD  loperamide (IMODIUM) 2 MG capsule Take 2 mg by mouth 2 (two) times daily as needed for diarrhea or loose stools.     Historical Provider, MD  metoprolol succinate (TOPROL-XL) 50 MG 24 hr tablet Take 25 mg by mouth daily. Take with or immediately following a meal.    Historical Provider, MD  neomycin-bacitracin-polymyxin (NEOSPORIN) OINT Apply 1 application topically 3 (three) times daily as needed for wound care.    Historical Provider, MD  OVER THE COUNTER MEDICATION Take 1 tablet by mouth at bedtime. Hyland's Leg Cramps PM for sleep and leg cramps    Historical Provider, MD  oxybutynin (DITROPAN) 5 MG tablet Take 10 mg by mouth at bedtime.  06/12/13   Historical Provider, MD  pantoprazole (PROTONIX) 40 MG tablet Take 40 mg by mouth daily.  06/12/13   Historical Provider, MD  predniSONE (DELTASONE) 20 MG tablet Take 2 tablets (40 mg total) by mouth daily with breakfast. 07/09/13   Rebecca Eaton, MD  rivaroxaban (XARELTO) 20 MG TABS tablet Take 20 mg by mouth daily with supper.    Historical Provider, MD  traMADol (ULTRAM) 50 MG tablet Take 50-100 mg by mouth 2 (two) times daily as needed (pain).     Historical Provider, MD  vitamin B-12 (CYANOCOBALAMIN) 500 MCG tablet Take 500 mcg by mouth daily.    Historical Provider, MD   BP 112/44  Pulse 67   Temp(Src) 98.9 F (37.2 C) (Rectal)  Resp 15  SpO2 100% Physical Exam  Constitutional: She is oriented to person, place, and time. She appears well-developed and well-nourished. No distress.  HENT:  Head: Normocephalic and atraumatic.  Right Ear: Hearing normal.  Left Ear: Hearing normal.  Nose: Nose normal.  Mouth/Throat: Oropharynx is clear and moist and mucous membranes are normal.  Eyes: Conjunctivae and EOM are normal. Pupils are equal,  round, and reactive to light.  Neck: Normal range of motion. Neck supple.  Cardiovascular: Regular rhythm, S1 normal and S2 normal.  Exam reveals no gallop and no friction rub.   No murmur heard. Pulmonary/Chest: Effort normal and breath sounds normal. No respiratory distress. She exhibits no tenderness.  Abdominal: Soft. Normal appearance and bowel sounds are normal. There is no hepatosplenomegaly. There is no tenderness. There is no rebound, no guarding, no tenderness at McBurney's point and negative Murphy's sign. No hernia.  Genitourinary:     Blood clots and bloody urine draining from the urethra. Speculum exam does not reveal any blood in the vaginal vault.  Musculoskeletal: Normal range of motion.  Neurological: She is alert and oriented to person, place, and time. She has normal strength. No cranial nerve deficit or sensory deficit. Coordination normal. GCS eye subscore is 4. GCS verbal subscore is 5. GCS motor subscore is 6.  Skin: Skin is warm, dry and intact. No rash noted. No cyanosis.  Psychiatric: She has a normal mood and affect. Her speech is normal and behavior is normal. Thought content normal.    ED Course  Procedures (including critical care time) Labs Review Labs Reviewed  PROTIME-INR - Abnormal; Notable for the following:    Prothrombin Time 16.4 (*)    All other components within normal limits  CBC - Abnormal; Notable for the following:    Hemoglobin 7.9 (*)    HCT 29.1 (*)    MCV 72.0 (*)    MCH 19.6 (*)    MCHC  27.1 (*)    RDW 20.6 (*)    All other components within normal limits  COMPREHENSIVE METABOLIC PANEL - Abnormal; Notable for the following:    CO2 35 (*)    Glucose, Bld 107 (*)    Albumin 3.1 (*)    Total Bilirubin <0.2 (*)    GFR calc non Af Amer 84 (*)    All other components within normal limits  URINALYSIS, ROUTINE W REFLEX MICROSCOPIC - Abnormal; Notable for the following:    Color, Urine RED (*)    APPearance TURBID (*)    Hgb urine dipstick LARGE (*)    Bilirubin Urine MODERATE (*)    Ketones, ur 15 (*)    Protein, ur >300 (*)    Nitrite POSITIVE (*)    Leukocytes, UA MODERATE (*)    All other components within normal limits  URINE MICROSCOPIC-ADD ON - Abnormal; Notable for the following:    Bacteria, UA FEW (*)    All other components within normal limits  URINE CULTURE  APTT  TYPE AND SCREEN    Imaging Review Dg Chest Port 1 View  08/03/2013   CLINICAL DATA:  Urethral bleeding, GI bleeding, hypertension, coronary artery disease post MI, COPD  EXAM: PORTABLE CHEST - 1 VIEW  COMPARISON:  Portable exam 0931 hr compared to 07/08/2013  FINDINGS: Normal heart size, mediastinal contours, and pulmonary vascularity.  Calcified tortuous thoracic aorta.  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  No acute infiltrate, pleural effusion or pneumothorax.  Bones demineralized.  Old posttraumatic deformity proximal LEFT humerus.  IMPRESSION: Emphysematous changes with biapical scarring.  No acute abnormalities.   Electronically Signed   By: Lavonia Dana M.D.   On: 08/03/2013 09:50     EKG Interpretation None      MDM   Final diagnoses:  UTI (lower urinary tract infection)  Hemorrhagic cystitis    Patient brought to  ER by ambulance, initially with concern over possible GI bleed. Upon arrival to the ER, however, examination revealed that she was having significant bleeding including passage of clots from her urethra. Workup  was consistent with urinary tract infection resulting in hemorrhagic cystitis. She is, however, on Xarelto. A Foley catheter was placed and the bladder was irrigated. The clots were cleared, the patient has ongoing bleeding and therefore I will asked medicine to admit her to the hospital for further management, as she is at risk for continuing ongoing bleeding resulting in anemia and need for transfusion.  Patient does have anemia compared to her baseline. He ranges between 10 and 11, she is 7.9 today. She came in hypertensive, but has now started to cycle her, around [id number] systolic. I suspect that the ongoing bleeding is resulting in these changes and I suspect that her hemoglobin will continue to drop and she will require transfusion. Patient will have a type and screen sent.  Urine culture has been sent. Patient initiated on Rocephin.     Orpah Greek, MD 08/03/13 1253  Orpah Greek, MD 08/03/13 1310

## 2013-08-03 NOTE — ED Notes (Signed)
Ordered lunch tray (grilled chicken salad, angel food cake, ranch dressing,sweet tea)

## 2013-08-03 NOTE — Progress Notes (Signed)
Pt transferred from ED with RN, on monitor, CBI at a moderate rate with xtra supplies tx with pt. Pt awake, oriented, VSS and able to move all ext. Pt having 10/10 bladder spasm pain, will give pain meds and continue to monitor. Blood bank is still preparing blood will follow up.

## 2013-08-03 NOTE — ED Notes (Signed)
Pt in xray when rounding

## 2013-08-03 NOTE — Progress Notes (Signed)
ANTIBIOTIC CONSULT NOTE - INITIAL  Pharmacy Consult for Rocephin Indication: UTI  No Known Allergies  Patient Measurements:   Vital Signs: Temp: 97.9 F (36.6 C) (05/22 1547) Temp src: Oral (05/22 1547) BP: 116/35 mmHg (05/22 1724) Pulse Rate: 70 (05/22 1724) Intake/Output from previous day:   Intake/Output from this shift: Total I/O In: 9000 [Other:9000] Out: 12425 [Urine:12425]  Labs:  Recent Labs  08/03/13 0921  WBC 8.8  HGB 7.9*  PLT 154  CREATININE 0.71   The CrCl is unknown because both a height and weight (above a minimum accepted value) are required for this calculation. No results found for this basename: VANCOTROUGH, VANCOPEAK, VANCORANDOM, GENTTROUGH, GENTPEAK, GENTRANDOM, TOBRATROUGH, TOBRAPEAK, TOBRARND, AMIKACINPEAK, AMIKACINTROU, AMIKACIN,  in the last 72 hours   Microbiology: No results found for this or any previous visit (from the past 720 hour(s)).  Medical History: Past Medical History  Diagnosis Date  . Hypertension   . Myocardial infarction   . Shortness of breath   . Asthma   . COPD (chronic obstructive pulmonary disease)     emphysema    . Pneumonia     hx pneumonia ,chronic bronchitis   . H/O blood clots   . Chronic kidney disease     current   UTI   being treated  . GERD (gastroesophageal reflux disease)   . Headache(784.0)     hx migraines  . Blood dyscrasia     hx blood clotts  . Arthritis     Assessment: 2 YOF presented with hematuria and painful urination x ~1 week, UA - nitrites positive, mod leukocytes, urine culture ordered. Pt. Is afebrile, wbc 8.8. Noted she is on xarelto 20mg  daily prior to admission for Afib, will hold for now, hgb 7.9 today, significant drop from one month ago. She received one dose of rocephin 1g IV today at 1300  Goal of Therapy:  Resolution of infection  Plan:  - Rocephin 1g IV Q 24 hrs next dose tomorrow noon - f/u clinical course and plan for resume anticoagulation.   Maryanna Shape, PharmD,  BCPS  Clinical Pharmacist  Pager: [phone]   08/03/2013,5:37 PM

## 2013-08-03 NOTE — ED Notes (Signed)
Pt from home, c/o gi bleed. Pt states dark stoo; starting last night, Per EMS, ferank blood during transport. NSD

## 2013-08-04 ENCOUNTER — Encounter (HOSPITAL_COMMUNITY): Payer: Self-pay | Admitting: *Deleted

## 2013-08-04 DIAGNOSIS — I2510 Atherosclerotic heart disease of native coronary artery without angina pectoris: Secondary | ICD-10-CM

## 2013-08-04 DIAGNOSIS — N189 Chronic kidney disease, unspecified: Secondary | ICD-10-CM

## 2013-08-04 DIAGNOSIS — D62 Acute posthemorrhagic anemia: Secondary | ICD-10-CM

## 2013-08-04 DIAGNOSIS — J9610 Chronic respiratory failure, unspecified whether with hypoxia or hypercapnia: Secondary | ICD-10-CM

## 2013-08-04 LAB — BASIC METABOLIC PANEL
BUN: 20 mg/dL (ref 6–23)
CO2: 35 mEq/L — ABNORMAL HIGH (ref 19–32)
Calcium: 8.7 mg/dL (ref 8.4–10.5)
Chloride: 102 mEq/L (ref 96–112)
Creatinine, Ser: 0.77 mg/dL (ref 0.50–1.10)
GFR calc Af Amer: >90 mL/min (ref >90)
GFR calc non Af Amer: 82 mL/min — ABNORMAL LOW (ref >90)
Glucose, Bld: 91 mg/dL (ref 70–99)
Potassium: 3.7 mEq/L (ref 3.7–5.3)
Sodium: 144 mEq/L (ref 137–147)

## 2013-08-04 LAB — CBC
HCT: 34 % — ABNORMAL LOW (ref 36.0–46.0)
Hemoglobin: 10.3 g/dL — ABNORMAL LOW (ref 12.0–15.0)
MCH: 23.4 pg — ABNORMAL LOW (ref 26.0–34.0)
MCHC: 30.3 g/dL (ref 30.0–36.0)
MCV: 77.3 fL — ABNORMAL LOW (ref 78.0–100.0)
Platelets: 98 10*3/uL — ABNORMAL LOW (ref 150–400)
RBC: 4.4 MIL/uL (ref 3.87–5.11)
RDW: 21 % — ABNORMAL HIGH (ref 11.5–15.5)
WBC: 6.7 10*3/uL (ref 4.0–10.5)

## 2013-08-04 LAB — URINE CULTURE: Colony Count: 8000

## 2013-08-04 MED ORDER — BUDESONIDE 0.25 MG/2ML IN SUSP
0.2500 mg | Freq: Two times a day (BID) | RESPIRATORY_TRACT | Status: DC
  Administered 2013-08-05: 0.25 mg via RESPIRATORY_TRACT
  Filled 2013-08-04 (×4): qty 2

## 2013-08-04 MED ORDER — POLYETHYLENE GLYCOL 3350 17 G PO PACK
17.0000 g | PACK | Freq: Every day | ORAL | Status: DC | PRN
  Filled 2013-08-04: qty 1

## 2013-08-04 MED ORDER — POLYSACCHARIDE IRON COMPLEX 150 MG PO CAPS
150.0000 mg | ORAL_CAPSULE | Freq: Two times a day (BID) | ORAL | Status: DC
  Administered 2013-08-04 – 2013-08-06 (×5): 150 mg via ORAL
  Filled 2013-08-04 (×6): qty 1

## 2013-08-04 MED ORDER — PREDNISONE 20 MG PO TABS
20.0000 mg | ORAL_TABLET | Freq: Every day | ORAL | Status: DC
  Administered 2013-08-04 – 2013-08-06 (×3): 20 mg via ORAL
  Filled 2013-08-04 (×4): qty 1

## 2013-08-04 MED ORDER — ALBUTEROL SULFATE (2.5 MG/3ML) 0.083% IN NEBU
2.5000 mg | INHALATION_SOLUTION | Freq: Three times a day (TID) | RESPIRATORY_TRACT | Status: DC
  Administered 2013-08-04 – 2013-08-05 (×4): 2.5 mg via RESPIRATORY_TRACT
  Filled 2013-08-04 (×4): qty 3

## 2013-08-04 MED ORDER — ALBUTEROL SULFATE (2.5 MG/3ML) 0.083% IN NEBU: 2.5000 mg | INHALATION_SOLUTION | Freq: Four times a day (QID) | RESPIRATORY_TRACT | Status: DC | PRN

## 2013-08-04 NOTE — Progress Notes (Signed)
Nutrition Brief Note  Patient identified on the Malnutrition Screening Tool (MST) Report for recent weight lost without trying.  Per wt readings below, patient's weight has trended up.  Wt Readings from Last 15 Encounters:  08/03/13 121 lb 11.1 oz (55.2 kg)  07/09/13 111 lb 3.2 oz (50.44 kg)  02/03/12 118 lb 6.2 oz (53.7 kg)  02/03/12 116 lb (52.617 kg)  02/03/12 116 lb (52.617 kg)  10/12/11 115 lb (52.164 kg)    Body mass index is 20.88 kg/(m^2). Patient meets criteria for Normal based on current BMI.   Current diet order is Heart Healthy, patient is consuming approximately 100% of meals at this time. Labs and medications reviewed.   No nutrition interventions warranted at this time. If nutrition issues arise, please consult RD.   Arthur Holms, RD, LDN Pager #: [phone] After-Hours Pager #: [phone]

## 2013-08-04 NOTE — Progress Notes (Signed)
[name] [name] [id number] Admission Data: 08/04/2013 3:48 PM Attending Provider: Barton Dubois, MD  NWG:NFAOZHY,QMVHQ L, MD Consults/ Treatment Team:    [name] [name] is a 73 y.o. female patient admitted from ED awake, alert  & orientated  X 3,  Full Code, VSS - Blood pressure 146/65, pulse 71, temperature 98.5 F (36.9 C), temperature source Oral, resp. rate 20, height 5\' 4"  (1.626 m), weight 55.2 kg (121 lb 11.1 oz), SpO2 95.00%., O2    3 L nasal cannular, no c/o shortness of breath, no c/o chest pain, no distress noted. Tele # 1 placed and pt is currently running:normal sinus rhythm.   IV site WDL:  forearm right, condition patent and no redness with a transparent dsg that's clean dry and intact.  Allergies:  No Known Allergies   Past Medical History  Diagnosis Date  . Hypertension   . Myocardial infarction   . Shortness of breath   . Asthma   . COPD (chronic obstructive pulmonary disease)     emphysema    . Pneumonia     hx pneumonia ,chronic bronchitis   . H/O blood clots   . Chronic kidney disease     current   UTI   being treated  . GERD (gastroesophageal reflux disease)   . Headache(784.0)     hx migraines  . Blood dyscrasia     hx blood clotts  . Arthritis     History:  obtained from the patient.  Pt orientation to unit, room and routine. Information packet given to patient/family and safety video watched.  Admission INP armband ID verified with patient/family, and in place. SR up x 2, fall risk assessment complete with Patient and family verbalizing understanding of risks associated with falls. Pt verbalizes an understanding of how to use the call bell and to call for help before getting out of bed.  Skin, clean-dry-with some ecchymosis to R arm and red scratch to L back.     Will cont to monitor and assist as needed.  Ke[place]on Garden Elaine Yacine Droz, RN 08/04/2013 3:48 PM

## 2013-08-04 NOTE — Progress Notes (Addendum)
[facility] PROGRESS NOTE  [name] [name] JAS:[id number] DOB: 01-07-1941 DOA: 08/03/2013 PCP: Leonides Sake, MD  Assessment/Plan: Anemia secondary to acute blood loss/hemorrhagic cystitis  -Patient has responded well to bladder irrigation, supportive care and resuscitation  -Patient's baseline hemoglobin appears to be 10.5-11, currently 10.3 after transfusion.  -Patient does have chronic blood loss and a Foley catheter bag.  -Patient will continue irrigation process (urine is more clear) -will start niferex BID  Hemorrhagic cystitis/urinary tract infection  -UA: Positive nitrites, moderate leukocytes, 7-10 white blood cells  -Pending urine culture  -Will continue patient on ceftriaxone  -Currently afebrile, no leukocytosis.  -Spoke with Dr. Matilde Sprang, urology. Patient may be referred as an outpatient, supportive care for now.   Hypotension  -Patient is normally hypertensive. Most likely due to blood loss -responded well with resuscitation; BP now stable.  -will continue holding lasix -patient stable, will transfer to telemetry floor.  Paroxysmal atrial fibrillation  -Currently rate and rhythm control and sinus.  -Continue digoxin and diltiazem.  -Will hold xarelto due to anemia with active bleeding   Overactive bladder  -Will continue oxybutynin   Right foot pain  -most likely from chronic OA -x-ray w/o any acute bony abnormalities  Chronic resp failure due to asthma/COPD/allergic rhinitis  -Currently compensated, continue home flonase and PRN albuterol -continue chronic O2 supplementation and has been on prednisone now for the last 8 weeks or so. -will add pulmicort and start prednisone tapering off process  -will set up appointment for PFT's and pulmonology evaluation after discharge.  Hyperlipidemia  -Continue statin and fish oil   DVT prophylaxis: SCDs  Code Status: Full Family Communication: no family at bedside currently Disposition Plan: will  transfer out of stepdown unit   Consultants:  Urology consulted; (recommended bladder irrigation, transfusion as needed ; antibiotics for UTI and outpatient follow up)  Procedures:  See below for x-ray reports   Antibiotics:  Rocephin   HPI/Subjective: Patient reports some bladder spasm overnight; but is afebrile and feeling a lot better.  Objective: Filed Vitals:   08/04/13 0733  BP: 137/53  Pulse: 67  Temp: 98.3 F (36.8 C)  Resp: 15    Intake/Output Summary (Last 24 hours) at 08/04/13 0818 Last data filed at 08/04/13 0739  Gross per 24 hour  Intake  25045 ml  Output  19525 ml  Net   5520 ml   Filed Weights   08/03/13 1925  Weight: 55.2 kg (121 lb 11.1 oz)    Exam:   General:  Feeling better; no CP, no SOB; denies palpitations  Cardiovascular: S1 and S2, no rubs or gallops  Respiratory: good air movement; no wheezing  Abdomen: soft, NT, ND; positive BS  Musculoskeletal: no edema or cyanosis  Data Reviewed: Basic Metabolic Panel:  Recent Labs Lab 08/03/13 0921 08/04/13 0529  NA 144 144  K 3.8 3.7  CL 99 102  CO2 35* 35*  GLUCOSE 107* 91  BUN 19 20  CREATININE 0.71 0.77  CALCIUM 8.7 8.7   Liver Function Tests:  Recent Labs Lab 08/03/13 0921  AST 16  ALT 11  ALKPHOS 56  BILITOT <0.2*  PROT 6.4  ALBUMIN 3.1*   CBC:  Recent Labs Lab 08/03/13 0921 08/03/13 1800 08/04/13 0529  WBC 8.8  --  6.7  HGB 7.9* 6.8* 10.3*  HCT 29.1* 24.1* 34.0*  MCV 72.0*  --  77.3*  PLT 154  --  98*   CBG: No results found for this basename: GLUCAP,  in the  last 168 hours  Recent Results (from the past 240 hour(s))  MRSA PCR SCREENING     Status: Abnormal   Collection Time    08/03/13  7:26 PM      Result Value Ref Range Status   MRSA by PCR POSITIVE (*) NEGATIVE Final   Comment:            The GeneXpert MRSA Assay (FDA     approved for NASAL specimens     only), is one component of a     comprehensive MRSA colonization     surveillance  program. It is not     intended to diagnose MRSA     infection nor to guide or     monitor treatment for     MRSA infections.     RESULT CALLED TO, READ BACK BY AND VERIFIED WITH:     L HITT RN 2145 08/03/13 A BROWNING     Studies: Dg Chest Port 1 View  08/03/2013   CLINICAL DATA:  Urethral bleeding, GI bleeding, hypertension, coronary artery disease post MI, COPD  EXAM: PORTABLE CHEST - 1 VIEW  COMPARISON:  Portable exam 0931 hr compared to 07/08/2013  FINDINGS: Normal heart size, mediastinal contours, and pulmonary vascularity.  Calcified tortuous thoracic aorta.  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  No acute infiltrate, pleural effusion or pneumothorax.  Bones demineralized.  Old posttraumatic deformity proximal LEFT humerus.  IMPRESSION: Emphysematous changes with biapical scarring.  No acute abnormalities.   Electronically Signed   By: Lavonia Dana M.D.   On: 08/03/2013 09:50   Dg Foot Complete Right  08/03/2013   CLINICAL DATA:  Right foot injury, possible trauma.  EXAM: RIGHT FOOT COMPLETE - 3+ VIEW  COMPARISON:  None.  FINDINGS: There is diffuse osteopenia. The phalanges and metatarsals are intact. Mild degenerative interphalangeal joint changes are present. There is a tiny plantar calcaneal spur. The soft tissues are normal.  IMPRESSION: No acute bony abnormality of the right foot.   Electronically Signed   By: David  Martinique   On: 08/03/2013 15:57    Scheduled Meds: . albuterol  2.5 mg Nebulization TID  . atorvastatin  40 mg Oral Daily  . cefTRIAXone (ROCEPHIN)  IV  1 g Intravenous Q24H  . Chlorhexidine Gluconate Cloth  6 each Topical Q0600  . cyanocobalamin  500 mcg Oral Daily  . digoxin  0.25 mg Oral Daily  . diltiazem  240 mg Oral Daily  . fluticasone  1 spray Each Nare Daily  . iron polysaccharides  150 mg Oral BID  . mupirocin ointment  1 application Nasal BID  . omega-3 acid ethyl esters  1 g Oral Daily  .  oxybutynin  10 mg Oral QHS  . pantoprazole  40 mg Oral Daily  . predniSONE  20 mg Oral Q breakfast  . sodium chloride  3 mL Intravenous Q12H    Time spent: >30 minutes   [facility] Pager [phone]. If 7PM-7AM, please contact night-coverage at [email or web address], password [facility] 08/04/2013, 8:18 AM  LOS: 1 day

## 2013-08-05 LAB — TYPE AND SCREEN
ABO/RH(D): B NEG
ANTIBODY SCREEN: POSITIVE
DAT, IGG: NEGATIVE
DONOR AG TYPE: NEGATIVE
DONOR AG TYPE: NEGATIVE
PT AG TYPE: NEGATIVE
UNIT DIVISION: 0
Unit division: 0
Unit division: 0

## 2013-08-05 LAB — LIPID PANEL
Cholesterol: 102 mg/dL (ref 0–200)
HDL: 47 mg/dL (ref >39)
LDL Cholesterol: 42 mg/dL (ref 0–99)
Total CHOL/HDL Ratio: 2.2 RATIO
Triglycerides: 66 mg/dL (ref <150)
VLDL: 13 mg/dL (ref 0–40)

## 2013-08-05 LAB — CBC
HCT: 32 % — ABNORMAL LOW (ref 36.0–46.0)
Hemoglobin: 9.8 g/dL — ABNORMAL LOW (ref 12.0–15.0)
MCH: 23.6 pg — AB (ref 26.0–34.0)
MCHC: 30.6 g/dL (ref 30.0–36.0)
MCV: 76.9 fL — ABNORMAL LOW (ref 78.0–100.0)
PLATELETS: 94 10*3/uL — AB (ref 150–400)
RBC: 4.16 MIL/uL (ref 3.87–5.11)
RDW: 21.6 % — AB (ref 11.5–15.5)
WBC: 8.2 10*3/uL (ref 4.0–10.5)

## 2013-08-05 MED ORDER — BUDESONIDE-FORMOTEROL FUMARATE 160-4.5 MCG/ACT IN AERO
2.0000 | INHALATION_SPRAY | Freq: Two times a day (BID) | RESPIRATORY_TRACT | Status: DC
  Administered 2013-08-05 – 2013-08-06 (×2): 2 via RESPIRATORY_TRACT
  Filled 2013-08-05: qty 6

## 2013-08-05 MED ORDER — CEFUROXIME AXETIL 500 MG PO TABS
500.0000 mg | ORAL_TABLET | Freq: Two times a day (BID) | ORAL | Status: DC
  Administered 2013-08-05 – 2013-08-06 (×3): 500 mg via ORAL
  Filled 2013-08-05 (×6): qty 1

## 2013-08-05 MED ORDER — TIOTROPIUM BROMIDE MONOHYDRATE 18 MCG IN CAPS
18.0000 ug | ORAL_CAPSULE | Freq: Every day | RESPIRATORY_TRACT | Status: DC
  Administered 2013-08-05 – 2013-08-06 (×2): 18 ug via RESPIRATORY_TRACT
  Filled 2013-08-05: qty 5

## 2013-08-05 NOTE — Progress Notes (Signed)
[facility] PROGRESS NOTE  [name] [name] PPJ:[id number] DOB: 05-04-1940 DOA: 08/03/2013 PCP: Leonides Sake, MD  Assessment/Plan: Anemia secondary to acute blood loss/hemorrhagic cystitis  -Patient has responded well to bladder irrigation, supportive care and resuscitation  -Patient's baseline hemoglobin appears to be 10.5-11, currently 9.8 after transfusion 2 units PRBC's on 5/22.  -will continue irrigation process (urine is continue to be more clear) -Will continue niferex BID and follow Hgb trend  Hemorrhagic cystitis/urinary tract infection  -UA: Positive nitrites, moderate leukocytes, 7-10 white blood cells  -urine culture w/o growth, but patient with clinical symptoms and findings of UTI -Will switch rocephin to ceftin and complete therapy  -Currently afebrile, no leukocytosis. Dysuria improving. -Spoke with Dr. Matilde Sprang, urology. Patient may to be referred as an outpatient, supportive care for now.   Hypotension  -Patient is normally hypertensive. Most likely due to blood loss -responded well to IVF's resuscitation; BP now stable.  -will continue holding lasix another day -monitor VS  Paroxysmal atrial fibrillation  -Currently rate and rhythm control  -will d/c cardiac monitoring  -Continue digoxin and diltiazem.  -Will hold xarelto due to anemia with active bleeding   Overactive bladder  -Will continue oxybutynin   Right foot pain  -most likely from chronic OA -x-ray w/o any acute bony abnormalities  Chronic resp failure due to asthma/COPD/allergic rhinitis  -Currently compensated, will start symbicort and spiriva for maintenance and PRN albuterol as rescue treatment -continue chronic O2 supplementation and has been on prednisone now for the last 8 weeks or so. -will continue prednisone tapering off process  -will set up appointment for PFT's and pulmonology evaluation after discharge.  Hyperlipidemia  -Continue statin and fish oil  -follow lipid  panel  DVT prophylaxis: SCDs  Code Status: Full Family Communication: no family at bedside currently Disposition Plan: will transfer out of stepdown unit   Consultants:  Urology consulted; (recommended bladder irrigation, transfusion as needed ; antibiotics for UTI and outpatient follow up)  Procedures:  See below for x-ray reports   Antibiotics:  Rocephin 5/22--5/24  ceftin 5/24 (plan is to treat for 7 more days)  HPI/Subjective: Patient reports some intermittent bladder discomfort (but a lot better than PTA); no fever. Urine is a lot clear this morning (5/24)  Objective: Filed Vitals:   08/05/13 0606  BP: 138/61  Pulse: 88  Temp: 98 F (36.7 C)  Resp: 18    Intake/Output Summary (Last 24 hours) at 08/05/13 0816 Last data filed at 08/05/13 0459  Gross per 24 hour  Intake   9880 ml  Output   5850 ml  Net   4030 ml   Filed Weights   08/03/13 1925  Weight: 55.2 kg (121 lb 11.1 oz)    Exam:   General:  Feeling better; no CP, no SOB; denies palpitations  Cardiovascular: S1 and S2, no rubs or gallops  Respiratory: good air movement; no wheezing  Abdomen: soft, NT, ND; positive BS  Musculoskeletal: no edema or cyanosis  Data Reviewed: Basic Metabolic Panel:  Recent Labs Lab 08/03/13 0921 08/04/13 0529  NA 144 144  K 3.8 3.7  CL 99 102  CO2 35* 35*  GLUCOSE 107* 91  BUN 19 20  CREATININE 0.71 0.77  CALCIUM 8.7 8.7   Liver Function Tests:  Recent Labs Lab 08/03/13 0921  AST 16  ALT 11  ALKPHOS 56  BILITOT <0.2*  PROT 6.4  ALBUMIN 3.1*   CBC:  Recent Labs Lab 08/03/13 0921 08/03/13 1800 08/04/13 0529 08/05/13 0545  WBC 8.8  --  6.7 8.2  HGB 7.9* 6.8* 10.3* 9.8*  HCT 29.1* 24.1* 34.0* 32.0*  MCV 72.0*  --  77.3* 76.9*  PLT 154  --  98* 94*    Recent Results (from the past 240 hour(s))  URINE CULTURE     Status: None   Collection Time    08/03/13  9:30 AM      Result Value Ref Range Status   Specimen Description URINE,  RANDOM   Final   Special Requests ADDED AT 1610 ON [id number]   Final   Culture  Setup Time     Final   Value: 08/03/2013 17:58     Performed at [facility]     Final   Value: 8,000 COLONIES/ML     Performed at [facility]   Culture     Final   Value: INSIGNIFICANT GROWTH     Performed at [facility]   Report Status 08/04/2013 FINAL   Final  MRSA PCR SCREENING     Status: Abnormal   Collection Time    08/03/13  7:26 PM      Result Value Ref Range Status   MRSA by PCR POSITIVE (*) NEGATIVE Final   Comment:            The GeneXpert MRSA Assay (FDA     approved for NASAL specimens     only), is one component of a     comprehensive MRSA colonization     surveillance program. It is not     intended to diagnose MRSA     infection nor to guide or     monitor treatment for     MRSA infections.     RESULT CALLED TO, READ BACK BY AND VERIFIED WITH:     L HITT RN 2145 08/03/13 A BROWNING     Studies: Dg Chest Port 1 View  08/03/2013   CLINICAL DATA:  Urethral bleeding, GI bleeding, hypertension, coronary artery disease post MI, COPD  EXAM: PORTABLE CHEST - 1 VIEW  COMPARISON:  Portable exam 0931 hr compared to 07/08/2013  FINDINGS: Normal heart size, mediastinal contours, and pulmonary vascularity.  Calcified tortuous thoracic aorta.  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  No acute infiltrate, pleural effusion or pneumothorax.  Bones demineralized.  Old posttraumatic deformity proximal LEFT humerus.  IMPRESSION: Emphysematous changes with biapical scarring.  No acute abnormalities.   Electronically Signed   By: Lavonia Dana M.D.   On: 08/03/2013 09:50   Dg Foot Complete Right  08/03/2013   CLINICAL DATA:  Right foot injury, possible trauma.  EXAM: RIGHT FOOT COMPLETE - 3+ VIEW  COMPARISON:  None.  FINDINGS: There is diffuse osteopenia. The phalanges and metatarsals are intact. Mild degenerative  interphalangeal joint changes are present. There is a tiny plantar calcaneal spur. The soft tissues are normal.  IMPRESSION: No acute bony abnormality of the right foot.   Electronically Signed   By: David  Martinique   On: 08/03/2013 15:57    Scheduled Meds: . albuterol  2.5 mg Nebulization TID  . atorvastatin  40 mg Oral Daily  . budesonide (PULMICORT) nebulizer solution  0.25 mg Nebulization BID  . cefUROXime  500 mg Oral BID WC  . Chlorhexidine Gluconate Cloth  6 each Topical Q0600  . cyanocobalamin  500 mcg Oral Daily  . digoxin  0.25 mg Oral Daily  . diltiazem  240  mg Oral Daily  . fluticasone  1 spray Each Nare Daily  . iron polysaccharides  150 mg Oral BID  . mupirocin ointment  1 application Nasal BID  . omega-3 acid ethyl esters  1 g Oral Daily  . oxybutynin  10 mg Oral QHS  . pantoprazole  40 mg Oral Daily  . predniSONE  20 mg Oral Q breakfast  . sodium chloride  3 mL Intravenous Q12H    Time spent: >30 minutes   [facility] Pager [phone]. If 7PM-7AM, please contact night-coverage at [email or web address], password [facility] 08/05/2013, 8:16 AM  LOS: 2 days

## 2013-08-05 NOTE — Evaluation (Signed)
Physical Therapy Evaluation Patient Details Name: [name] [name] MRN: [id number] DOB: [birth year] Today's Date: 08/05/2013   History of Present Illness  pt is a 73 y.o. female With a history of COPD, hypertension, that presents emergency department with complaints of vaginal bleeding. Patient also complains painful urination for approximately one week. pt being treated for UTI and Hemorrhagic cystitis.   Clinical Impression  Pt adm due to the above. Presents with generalized deconditioning and required supervision for mobility at this time. Pt will benefit from acute PT to address deficits and maximize independence with mobility prior to returning home alone. Pt lives alone; will need to be mod I prior to D/C.    Follow Up Recommendations No PT follow up;Supervision - Intermittent;Supervision for mobility/OOB    Equipment Recommendations  None recommended by PT    Recommendations for Other Services       Precautions / Restrictions Precautions Precautions: Fall Precaution Comments: reports she fell 3 years ago  Restrictions Weight Bearing Restrictions: No      Mobility  Bed Mobility Overal bed mobility: Modified Independent;Independent                Transfers Overall transfer level: Needs assistance Equipment used: Rolling walker (2 wheeled) Transfers: Sit to/from Stand Sit to Stand: Supervision         General transfer comment: supervision for safety and cues for hand placement and sequencing   Ambulation/Gait Ambulation/Gait assistance: Supervision Ambulation Distance (Feet): 30 Feet Assistive device: Rolling walker (2 wheeled) Gait Pattern/deviations: Step-through pattern;Decreased stride length;Narrow base of support Gait velocity: decreased  Gait velocity interpretation: Below normal speed for age/gender General Gait Details: pt with difficulty managing RW around obstacles in room; cues for sequencing; no LOB noted; limited ambulation due to leaking foley    Stairs            Wheelchair Mobility    Modified Rankin (Stroke Patients Only)       Balance Overall balance assessment: History of Falls;No apparent balance deficits (not formally assessed)                                           Pertinent Vitals/Pain No c/o pain     Home Living Family/patient expects to be discharged to:: Private residence Living Arrangements: Alone Available Help at Discharge: Family;Available PRN/intermittently;Other (Comment) Type of Home: Apartment Home Access: Level entry     Home Layout: One level Home Equipment: Walker - 4 wheels;Bedside commode;Shower seat;Adaptive equipment      Prior Function Level of Independence: Needs assistance   Gait / Transfers Assistance Needed: ambulates with RW  ADL's / Homemaking Assistance Needed: children do cleaning; meals on wheels for lunches and RN does medications         Hand Dominance   Dominant Hand: Left    Extremity/Trunk Assessment   Upper Extremity Assessment: Overall WFL for tasks assessed           Lower Extremity Assessment: Overall WFL for tasks assessed      Cervical / Trunk Assessment: Normal  Communication   Communication: HOH  Cognition Arousal/Alertness: Awake/alert Behavior During Therapy: WFL for tasks assessed/performed Overall Cognitive Status: Within Functional Limits for tasks assessed                      General Comments      Exercises  Assessment/Plan    PT Assessment Patient needs continued PT services  PT Diagnosis Difficulty walking   PT Problem List Decreased activity tolerance;Decreased balance;Decreased mobility;Decreased knowledge of use of DME;Decreased safety awareness;Decreased knowledge of precautions  PT Treatment Interventions DME instruction;Gait training;Functional mobility training;Therapeutic exercise;Therapeutic activities;Balance training;Neuromuscular re-education;Patient/family education    PT Goals (Current goals can be found in the Care Plan section) Acute Rehab PT Goals Patient Stated Goal: to get home and live alone like always  PT Goal Formulation: With patient Time For Goal Achievement: 08/08/13 Potential to Achieve Goals: Good    Frequency Min 3X/week   Barriers to discharge Decreased caregiver support lives alone     Co-evaluation               End of Session Equipment Utilized During Treatment: Gait belt;Oxygen (2 L) Activity Tolerance: Patient tolerated treatment well Patient left: in chair;with call bell/phone within reach Nurse Communication: Mobility status         Time: 1550-1610 PT Time Calculation (min): 20 min   Charges:   PT Evaluation $Initial PT Evaluation Tier I: 1 Procedure PT Treatments $Gait Training: 8-22 mins   PT G CodesKennis Carina Garber, [place]  [phone] 08/05/2013, 4:23 PM

## 2013-08-06 LAB — BASIC METABOLIC PANEL WITH GFR
BUN: 11 mg/dL (ref 6–23)
CO2: 29 meq/L (ref 19–32)
Calcium: 8.6 mg/dL (ref 8.4–10.5)
Chloride: 103 meq/L (ref 96–112)
Creatinine, Ser: 0.59 mg/dL (ref 0.50–1.10)
GFR calc Af Amer: >90 mL/min
GFR calc non Af Amer: 89 mL/min — ABNORMAL LOW
Glucose, Bld: 93 mg/dL (ref 70–99)
Potassium: 3.9 meq/L (ref 3.7–5.3)
Sodium: 141 meq/L (ref 137–147)

## 2013-08-06 LAB — CBC
HEMATOCRIT: 33.1 % — AB (ref 36.0–46.0)
Hemoglobin: 9.6 g/dL — ABNORMAL LOW (ref 12.0–15.0)
MCH: 22.6 pg — ABNORMAL LOW (ref 26.0–34.0)
MCHC: 29 g/dL — ABNORMAL LOW (ref 30.0–36.0)
MCV: 78.1 fL (ref 78.0–100.0)
Platelets: 103 10*3/uL — ABNORMAL LOW (ref 150–400)
RBC: 4.24 MIL/uL (ref 3.87–5.11)
RDW: 22.8 % — ABNORMAL HIGH (ref 11.5–15.5)
WBC: 9.3 10*3/uL (ref 4.0–10.5)

## 2013-08-06 MED ORDER — METOPROLOL SUCCINATE ER 25 MG PO TB24: 25.0000 mg | ORAL_TABLET | Freq: Every day | ORAL | Status: DC

## 2013-08-06 MED ORDER — PREDNISONE 20 MG PO TABS: ORAL_TABLET | ORAL | Status: DC

## 2013-08-06 MED ORDER — TIOTROPIUM BROMIDE MONOHYDRATE 18 MCG IN CAPS: 18.0000 ug | ORAL_CAPSULE | Freq: Every day | RESPIRATORY_TRACT | Status: DC

## 2013-08-06 MED ORDER — CEFUROXIME AXETIL 500 MG PO TABS: 500.0000 mg | ORAL_TABLET | Freq: Two times a day (BID) | ORAL | Status: AC

## 2013-08-06 MED ORDER — FUROSEMIDE 40 MG PO TABS: 20.0000 mg | ORAL_TABLET | Freq: Every day | ORAL | Status: DC

## 2013-08-06 MED ORDER — POLYSACCHARIDE IRON COMPLEX 150 MG PO CAPS: 150.0000 mg | ORAL_CAPSULE | Freq: Two times a day (BID) | ORAL | Status: DC

## 2013-08-06 MED ORDER — FLUTICASONE PROPIONATE 50 MCG/ACT NA SUSP: 1.0000 | Freq: Every day | NASAL | Status: AC | PRN

## 2013-08-06 MED ORDER — RIVAROXABAN 20 MG PO TABS: 20.0000 mg | ORAL_TABLET | Freq: Every day | ORAL | Status: DC

## 2013-08-06 MED ORDER — BUDESONIDE-FORMOTEROL FUMARATE 160-4.5 MCG/ACT IN AERO: 2.0000 | INHALATION_SPRAY | Freq: Two times a day (BID) | RESPIRATORY_TRACT | Status: DC

## 2013-08-06 NOTE — Progress Notes (Signed)
Physical Therapy Treatment Patient Details Name: [name] [name] MRN: [id number] DOB: [birth year] Today's Date: 08/06/2013    History of Present Illness pt is a 73 y.o. female With a history of COPD, hypertension, that presents emergency department with complaints of vaginal bleeding. Patient also complains painful urination for approximately one week. pt being treated for UTI and Hemorrhagic cystitis.     PT Comments    Pt pleasant & willing to participate in therapy.  Currently at Supervision level for OOB mobility but pt states she feels she is at baseline with mobility.    Follow Up Recommendations  No PT follow up;Supervision - Intermittent;Supervision for mobility/OOB     Equipment Recommendations  None recommended by PT    Recommendations for Other Services       Precautions / Restrictions Precautions Precautions: Fall Precaution Comments: reports she fell 3 years ago  Restrictions Weight Bearing Restrictions: No    Mobility  Bed Mobility Overal bed mobility: Modified Independent                Transfers Overall transfer level: Needs assistance Equipment used: Rolling walker (2 wheeled) Transfers: Sit to/from Stand Sit to Stand: Supervision         General transfer comment: cues to reinforce safe hand placement  Ambulation/Gait Ambulation/Gait assistance: Supervision Ambulation Distance (Feet): 100 Feet Assistive device: Rolling walker (2 wheeled) Gait Pattern/deviations: Step-through pattern;Decreased stride length;Narrow base of support Gait velocity: decreased    General Gait Details: Pt cont's to have difficulty managing RW but did not need any physical (A) nor unsteadiness noted.  Ambulated without supplemental 02 with no presentation of SOB but once back in room pt reports she wears continuous 02 at home.  02 sats checked with sats at 92% RA.  Pt placed back on 2L 02 after returning to recliner.     Stairs            Wheelchair  Mobility    Modified Rankin (Stroke Patients Only)       Balance                                    Cognition Arousal/Alertness: Awake/alert Behavior During Therapy: WFL for tasks assessed/performed Overall Cognitive Status: Within Functional Limits for tasks assessed                      Exercises      General Comments        Pertinent Vitals/Pain No pain reported.     Home Living                      Prior Function            PT Goals (current goals can now be found in the care plan section) Acute Rehab PT Goals Patient Stated Goal: to get home and live alone like always  PT Goal Formulation: With patient Time For Goal Achievement: 08/08/13 Potential to Achieve Goals: Good Progress towards PT goals: Progressing toward goals    Frequency  Min 3X/week    PT Plan Current plan remains appropriate    Co-evaluation             End of Session Equipment Utilized During Treatment: Gait belt Activity Tolerance: Patient tolerated treatment well Patient left: in chair;with chair alarm set     Time: 0941-1000 PT Time Calculation (min): 19 min  Charges:  $Gait Training: 8-22 mins                    G Codes:      Sena Hitch 08/19/2013, 10:36 AM   Sarajane Marek, PTA [phone] 08/19/13

## 2013-08-06 NOTE — Discharge Summary (Signed)
Physician Discharge Summary  AIREANA KNUDSON [id number] DOB: 05-01-1940 DOA: 08/03/2013  PCP: Leonides Sake, MD  Admit date: 08/03/2013 Discharge date: 08/06/2013  Time spent: >30 minutes  Recommendations for Outpatient Follow-up:  CBC to follow hemoglobin trend Basic metabolic panel to follow electrolytes and renal function Please assure that the patient has set up appointment to follow with urologist for further evaluation and treatment of hematuria. Patient will also need followup with pulmonologist service for further evaluation and treatment of her COPD. (Patient needs PFTs).  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 Patient with just mild pinkish appearance in her urine; currently not complaining of any dysuria. She will discharged on Ceftin 500 mg twice a day to complete antibiotic therapy and has been instructed to set up appointment with urology service in one week.  Diet recommendation: Low-sodium diet/are healthy  Filed Weights   08/03/13 1925  Weight: 55.2 kg (121 lb 11.1 oz)    History of present illness:  73 y.o. female With a history of COPD, hypertension, that presents emergency department with complaints of vaginal bleeding. Patient also complains painful urination for approximately one week. Patient called EMS this morning because she thought she had blood all associated with her bowel movement, as the toilet was filled with very dark blood. Patient states this has been ongoing for approximately 2-3 days. Arrival to emergency department, it was thought that patient may have had a GI bleed. Upon examination in the emergency department, patient blood clots from her urethra. A Foley catheter was  placed, with bladder irrigation. Patient currently denies any chest pain, shortness of breath, abdominal pain. Per her son, she does not like to go to the doctor, therefore she "sits on things."   Hospital Course:  Anemia secondary to acute blood loss/hemorrhagic cystitis  -Patient has responded well to bladder irrigation, supportive care, fluid resuscitation and transfusion. -Patient's baseline hemoglobin appears to be 10.5-11, currently 9.8 after transfusion 2 units PRBC's during this admission. -after 3 days of CBI urine is pretty much clear and patient denies dysuria; patient will follow up with urology service as an outpatient. -Will continue niferex BID and follow Hgb trend and hospital followup visit.    Hemorrhagic cystitis/urinary tract infection  -UA: Positive nitrites, moderate leukocytes, 7-10 white blood cells  -urine culture w/o growth, but patient with clinical symptoms and findings of UTI; she also received antibiotics prior to urine culture being sent. -Patient received 3 days of IV antibiotics and once her symptoms improve was switched to to ceftin tocomplete therapy  -Currently afebrile, no leukocytosis. Dysuria resolved -Spoke with Dr. Matilde Sprang, urology. Patient will need to be seen as an outpatient, supportive care  And abx'sfor now.   Hypertension with hpotension episode -Patient is normally hypertensive. Most likely due to blood loss  -responded well to IVF's resuscitation; BP now stable.  -will resume antihypertensive regimen at an adjusted dose -Patient advised to follow a heart healthy/low sodium diet.  Paroxysmal atrial fibrillation  -Currently rate control and sinus rhythm  -Continue digoxin and diltiazem.  -Will hold xarelto due to anemia with active bleeding; is to hold anticoagulation for 10 days, and resumed after this period of time. Patient will be arranging follow up visit with urology service prior to resuming anticoagulants.   Overactive bladder   -Continue oxybutynin   Right foot  pain  -most likely from chronic OA  -x-ray w/o any acute fracture or major bony abnormalities   Chronic resp failure due to asthma/COPD/allergic rhinitis  -continue chronic O2 supplementation and will discharge on symbicort and Spiriva, will continue PRN albuterol as rescue treatment. -will continue prednisone tapering off process over the next 5 days -will need outpatient appointment for PFT's and pulmonology evaluation after discharge for COPD.   Hyperlipidemia  -Continue statin and fish oil  -lipid panel (cholesterol 102, triglycerides 66, HDL 47 and LDL 42).   Procedures:  See below for x-ray reports  CBI until day of discharge (4 days total)  Consultations:  Urology consulted; (recommended bladder irrigation, transfusion as needed ; antibiotics for UTI and outpatient follow up)  Discharge Exam: Filed Vitals:   08/06/13 0916  BP: 117/58  Pulse: 64  Temp:   Resp:    General: Feeling better; no CP, no SOB; denies palpitations; urine almost completely clear at discharge. Cardiovascular: S1 and S2, no rubs or gallops; positive SEM Respiratory: good air movement; no wheezing  Abdomen: soft, NT, ND; positive BS  Musculoskeletal: no edema or cyanosis   Discharge Instructions You were cared for by a hospitalist during your hospital stay. If you have any questions about your discharge medications or the care you received while you were in the hospital after you are discharged, you can call the unit and asked to speak with the hospitalist on call if the hospitalist that took care of you is not available. Once you are discharged, your primary care physician will handle any further medical issues. Please note that NO REFILLS for any discharge medications will be authorized once you are discharged, as it is imperative that you return to your primary care physician (or establish a relationship with a primary care physician if you do not have one)  for your aftercare needs so that they can reassess your need for medications and monitor your lab values.  Discharge Instructions   Diet - low sodium heart healthy    Complete by:  As directed      Discharge instructions    Complete by:  As directed   Take medications as prescribed Keep yourself well hydrated Follow with PCP in 10 days Arrange follow up with urology service in 1 week (for follow up of hematuria) Arrange follow up with pulmonary service (for further evaluation on your COPD; and to performed PFT's)            Medication List    STOP taking these medications       BAYER BACK & BODY PAIN EX ST PO      TAKE these medications       albuterol (2.5 MG/3ML) 0.083% nebulizer solution  Commonly known as:  PROVENTIL  Take 2.5 mg by nebulization every 6 (six) hours.     atorvastatin 40 MG tablet  Commonly known as:  LIPITOR  Take 40 mg by mouth daily.     budesonide-formoterol 160-4.5 MCG/ACT inhaler  Commonly known as:  SYMBICORT  Inhale 2 puffs into the lungs 2 (two) times daily.     cefUROXime 500 MG tablet  Commonly known as:  CEFTIN  Take 1 tablet (500 mg total) by mouth 2 (two) times daily with a meal.     digoxin 0.25 MG tablet  Commonly known as:  LANOXIN  Take 0.25 mg by mouth daily.     diltiazem 240 MG 24 hr capsule  Commonly known as:  DILACOR XR  Take  240 mg by mouth daily.     docusate sodium 100 MG capsule  Commonly known as:  COLACE  Take 100 mg by mouth daily as needed (constipation).     fish oil-omega-3 fatty acids 1000 MG capsule  Take 1 g by mouth daily.     fluticasone 50 MCG/ACT nasal spray  Commonly known as:  FLONASE  Place 1 spray into both nostrils daily as needed for allergies or rhinitis.     furosemide 40 MG tablet  Commonly known as:  LASIX  Take 0.5 tablets (20 mg total) by mouth daily.     iron polysaccharides 150 MG capsule  Commonly known as:  NIFEREX  Take 1 capsule (150 mg total) by mouth 2 (two) times daily.      loperamide 2 MG capsule  Commonly known as:  IMODIUM  Take 2 mg by mouth 2 (two) times daily as needed for diarrhea or loose stools.     metoprolol succinate 25 MG 24 hr tablet  Commonly known as:  TOPROL-XL  Take 1 tablet (25 mg total) by mouth daily. Take with or immediately following a meal.     neomycin-bacitracin-polymyxin Oint  Commonly known as:  NEOSPORIN  Apply 1 application topically 3 (three) times daily as needed for wound care.     OVER THE COUNTER MEDICATION  Take 1 tablet by mouth at bedtime. Hyland's Leg Cramps PM for sleep and leg cramps     oxybutynin 5 MG tablet  Commonly known as:  DITROPAN  Take 10 mg by mouth at bedtime.     pantoprazole 40 MG tablet  Commonly known as:  PROTONIX  Take 40 mg by mouth daily.     predniSONE 20 MG tablet  Commonly known as:  DELTASONE  Take 1 tablet X 2 days; then 1/2 tablet X 3 days and stop prednisone.     rivaroxaban 20 MG Tabs tablet  Commonly known as:  XARELTO  Take 1 tablet (20 mg total) by mouth daily with supper. Hold for 10 days and then resume medication as previously prescribed.     tiotropium 18 MCG inhalation capsule  Commonly known as:  SPIRIVA  Place 1 capsule (18 mcg total) into inhaler and inhale daily.     traMADol 50 MG tablet  Commonly known as:  ULTRAM  Take 50-100 mg by mouth 2 (two) times daily as needed (pain).     vitamin B-12 500 MCG tablet  Commonly known as:  CYANOCOBALAMIN  Take 500 mcg by mouth daily.       No Known Allergies     Follow-up Information   Follow up with [facility], MD. Schedule an appointment as soon as possible for a visit in 10 days.   Specialty:  Family Medicine   Contact information:   Dr. Daiva Eves [address] [place] [address] [phone]       Call [facility]. (call office to set up appointment for PFT's and further evaluation of COPD)    Contact information:   [place] [phone]        Follow up with [facility] Schedule an appointment as soon as possible for a visit in 1 week. (office to set up appointment in the next week for further evaluation of hematuria.)    Contact information:   [place] [address] [phone]       The results of significant diagnostics from  this hospitalization (including imaging, microbiology, ancillary and laboratory) are listed below for reference.    Significant Diagnostic Studies: Dg Chest 2 View  07/08/2013   CLINICAL DATA:  Shortness of breath and chest pain with history of COPD and pneumonia and previous MI  EXAM: CHEST  2 VIEW  COMPARISON:  CT ANGIO CHEST W/CM &/OR WO/CM dated 02/01/2012; DG CHEST 2 VIEW dated 01/31/2012; DG CHEST 1V PORT dated 01/29/2012  FINDINGS: The lungs are hyperinflated. There is no focal infiltrate. There is apical scarring bilaterally most notably on the right. This is not new. There is mild retraction of the hilar structures superiorly. The cardiopericardial silhouette is normal in size. The pulmonary vascularity is not engorged. The mediastinum is normal in width. There is mild dextro correction levoscoliosis of the mid thoracic spine. There is no pleural effusion or pneumothorax. The observed portions of the bony thorax appear normal.  IMPRESSION: There is hyperinflation consistent with COPD. There is no focal pneumonia nor evidence of CHF. Persistently increased density in the right upper lobe superiorly is demonstrated but not greatly changed from previous studies. If the patient's symptoms persist or remain unexplained, follow-up chest CT scanning may be useful.   Electronically Signed   By: David  Martinique   On: 07/08/2013 17:39   Dg Chest Port 1 View  08/03/2013   CLINICAL DATA:  Urethral bleeding, GI bleeding, hypertension, coronary artery disease post MI, COPD  EXAM: PORTABLE CHEST - 1 VIEW  COMPARISON:  Portable exam 0931 hr compared to 07/08/2013  FINDINGS: Normal  heart size, mediastinal contours, and pulmonary vascularity.  Calcified tortuous thoracic aorta.  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  No acute infiltrate, pleural effusion or pneumothorax.  Bones demineralized.  Old posttraumatic deformity proximal LEFT humerus.  IMPRESSION: Emphysematous changes with biapical scarring.  No acute abnormalities.   Electronically Signed   By: Lavonia Dana M.D.   On: 08/03/2013 09:50   Dg Foot Complete Right  08/03/2013   CLINICAL DATA:  Right foot injury, possible trauma.  EXAM: RIGHT FOOT COMPLETE - 3+ VIEW  COMPARISON:  None.  FINDINGS: There is diffuse osteopenia. The phalanges and metatarsals are intact. Mild degenerative interphalangeal joint changes are present. There is a tiny plantar calcaneal spur. The soft tissues are normal.  IMPRESSION: No acute bony abnormality of the right foot.   Electronically Signed   By: David  Martinique   On: 08/03/2013 15:57    Microbiology: Recent Results (from the past 240 hour(s))  URINE CULTURE     Status: None   Collection Time    08/03/13  9:30 AM      Result Value Ref Range Status   Specimen Description URINE, RANDOM   Final   Special Requests ADDED AT 7829 ON 562130   Final   Culture  Setup Time     Final   Value: 08/03/2013 17:58     Performed at [facility]     Final   Value: 8,000 COLONIES/ML     Performed at [facility]   Culture     Final   Value: INSIGNIFICANT GROWTH     Performed at [facility]   Report Status 08/04/2013 FINAL   Final  MRSA PCR SCREENING     Status: Abnormal   Collection Time    08/03/13  7:26 PM      Result Value Ref Range Status   MRSA by PCR POSITIVE (*)  NEGATIVE Final   Comment:            The GeneXpert MRSA Assay (FDA     approved for NASAL specimens     only), is one component of a     comprehensive MRSA colonization     surveillance program. It is not     intended to diagnose  MRSA     infection nor to guide or     monitor treatment for     MRSA infections.     RESULT CALLED TO, READ BACK BY AND VERIFIED WITH:     L HITT RN 2145 08/03/13 A BROWNING     Labs: Basic Metabolic Panel:  Recent Labs Lab 08/03/13 0921 08/04/13 0529 08/06/13 0453  NA 144 144 141  K 3.8 3.7 3.9  CL 99 102 103  CO2 35* 35* 29  GLUCOSE 107* 91 93  BUN 19 20 11   CREATININE 0.71 0.77 0.59  CALCIUM 8.7 8.7 8.6   Liver Function Tests:  Recent Labs Lab 08/03/13 0921  AST 16  ALT 11  ALKPHOS 56  BILITOT <0.2*  PROT 6.4  ALBUMIN 3.1*   CBC:  Recent Labs Lab 08/03/13 0921 08/03/13 1800 08/04/13 0529 08/05/13 0545 08/06/13 0453  WBC 8.8  --  6.7 8.2 9.3  HGB 7.9* 6.8* 10.3* 9.8* 9.6*  HCT 29.1* 24.1* 34.0* 32.0* 33.1*  MCV 72.0*  --  77.3* 76.9* 78.1  PLT 154  --  98* 94* 103*    Signed:  Barton Dubois  [facility] 08/06/2013, 12:33 PM

## 2013-08-06 NOTE — Progress Notes (Signed)
[name] [name] discharged Home per MD order.  Discharge instructions reviewed and discussed with the patient, all questions and concerns answered. Copy of instructions, care notes for new medication, diagnosis and scripts given to patient.    Medication List    STOP taking these medications       BAYER BACK & BODY PAIN EX ST PO      TAKE these medications       albuterol (2.5 MG/3ML) 0.083% nebulizer solution  Commonly known as:  PROVENTIL  Take 2.5 mg by nebulization every 6 (six) hours.     atorvastatin 40 MG tablet  Commonly known as:  LIPITOR  Take 40 mg by mouth daily.     budesonide-formoterol 160-4.5 MCG/ACT inhaler  Commonly known as:  SYMBICORT  Inhale 2 puffs into the lungs 2 (two) times daily.     cefUROXime 500 MG tablet  Commonly known as:  CEFTIN  Take 1 tablet (500 mg total) by mouth 2 (two) times daily with a meal.     digoxin 0.25 MG tablet  Commonly known as:  LANOXIN  Take 0.25 mg by mouth daily.     diltiazem 240 MG 24 hr capsule  Commonly known as:  DILACOR XR  Take 240 mg by mouth daily.     docusate sodium 100 MG capsule  Commonly known as:  COLACE  Take 100 mg by mouth daily as needed (constipation).     fish oil-omega-3 fatty acids 1000 MG capsule  Take 1 g by mouth daily.     fluticasone 50 MCG/ACT nasal spray  Commonly known as:  FLONASE  Place 1 spray into both nostrils daily as needed for allergies or rhinitis.     furosemide 40 MG tablet  Commonly known as:  LASIX  Take 0.5 tablets (20 mg total) by mouth daily.     iron polysaccharides 150 MG capsule  Commonly known as:  NIFEREX  Take 1 capsule (150 mg total) by mouth 2 (two) times daily.     loperamide 2 MG capsule  Commonly known as:  IMODIUM  Take 2 mg by mouth 2 (two) times daily as needed for diarrhea or loose stools.     metoprolol succinate 25 MG 24 hr tablet  Commonly known as:  TOPROL-XL  Take 1 tablet (25 mg total) by mouth daily. Take with or immediately following  a meal.     neomycin-bacitracin-polymyxin Oint  Commonly known as:  NEOSPORIN  Apply 1 application topically 3 (three) times daily as needed for wound care.     OVER THE COUNTER MEDICATION  Take 1 tablet by mouth at bedtime. Hyland's Leg Cramps PM for sleep and leg cramps     oxybutynin 5 MG tablet  Commonly known as:  DITROPAN  Take 10 mg by mouth at bedtime.     pantoprazole 40 MG tablet  Commonly known as:  PROTONIX  Take 40 mg by mouth daily.     predniSONE 20 MG tablet  Commonly known as:  DELTASONE  Take 1 tablet X 2 days; then 1/2 tablet X 3 days and stop prednisone.     rivaroxaban 20 MG Tabs tablet  Commonly known as:  XARELTO  Take 1 tablet (20 mg total) by mouth daily with supper. Hold for 10 days and then resume medication as previously prescribed.     tiotropium 18 MCG inhalation capsule  Commonly known as:  SPIRIVA  Place 1 capsule (18 mcg total) into inhaler and inhale daily.  traMADol 50 MG tablet  Commonly known as:  ULTRAM  Take 50-100 mg by mouth 2 (two) times daily as needed (pain).     vitamin B-12 500 MCG tablet  Commonly known as:  CYANOCOBALAMIN  Take 500 mcg by mouth daily.        Patients skin is clean, dry and intact, no evidence of skin break down, bruises to bilat. arms IV site discontinued and catheter remains intact. Site without signs and symptoms of complications. Dressing and pressure applied.  Patient escorted to car by NT in a wheelchair,  no distress noted upon discharge.  [name] [name] C [name] [name] 08/06/2013 1:37 PM

## 2013-09-24 ENCOUNTER — Encounter (HOSPITAL_COMMUNITY): Payer: Self-pay | Admitting: Emergency Medicine

## 2013-09-24 ENCOUNTER — Emergency Department (HOSPITAL_COMMUNITY): Payer: Medicare HMO

## 2013-09-24 ENCOUNTER — Emergency Department (HOSPITAL_COMMUNITY)
Admission: EM | Admit: 2013-09-24 | Discharge: 2013-09-24 | Disposition: A | Discharge status: Home / Self Care | Payer: Medicare HMO | Attending: Emergency Medicine | Admitting: Emergency Medicine

## 2013-09-24 DIAGNOSIS — Z8701 Personal history of pneumonia (recurrent): Secondary | ICD-10-CM | POA: Diagnosis not present

## 2013-09-24 DIAGNOSIS — Z9981 Dependence on supplemental oxygen: Secondary | ICD-10-CM | POA: Insufficient documentation

## 2013-09-24 DIAGNOSIS — Z86718 Personal history of other venous thrombosis and embolism: Secondary | ICD-10-CM | POA: Insufficient documentation

## 2013-09-24 DIAGNOSIS — J449 Chronic obstructive pulmonary disease, unspecified: Secondary | ICD-10-CM | POA: Diagnosis not present

## 2013-09-24 DIAGNOSIS — F17200 Nicotine dependence, unspecified, uncomplicated: Secondary | ICD-10-CM | POA: Diagnosis not present

## 2013-09-24 DIAGNOSIS — I129 Hypertensive chronic kidney disease with stage 1 through stage 4 chronic kidney disease, or unspecified chronic kidney disease: Secondary | ICD-10-CM | POA: Diagnosis not present

## 2013-09-24 DIAGNOSIS — Z8719 Personal history of other diseases of the digestive system: Secondary | ICD-10-CM | POA: Insufficient documentation

## 2013-09-24 DIAGNOSIS — I252 Old myocardial infarction: Secondary | ICD-10-CM | POA: Diagnosis not present

## 2013-09-24 DIAGNOSIS — J4489 Other specified chronic obstructive pulmonary disease: Secondary | ICD-10-CM | POA: Insufficient documentation

## 2013-09-24 DIAGNOSIS — N189 Chronic kidney disease, unspecified: Secondary | ICD-10-CM | POA: Diagnosis not present

## 2013-09-24 DIAGNOSIS — Z862 Personal history of diseases of the blood and blood-forming organs and certain disorders involving the immune mechanism: Secondary | ICD-10-CM | POA: Insufficient documentation

## 2013-09-24 DIAGNOSIS — J45901 Unspecified asthma with (acute) exacerbation: Principal | ICD-10-CM

## 2013-09-24 DIAGNOSIS — J441 Chronic obstructive pulmonary disease with (acute) exacerbation: Secondary | ICD-10-CM | POA: Diagnosis not present

## 2013-09-24 DIAGNOSIS — Z79899 Other long term (current) drug therapy: Secondary | ICD-10-CM | POA: Insufficient documentation

## 2013-09-24 DIAGNOSIS — Z8739 Personal history of other diseases of the musculoskeletal system and connective tissue: Secondary | ICD-10-CM | POA: Diagnosis not present

## 2013-09-24 DIAGNOSIS — R0602 Shortness of breath: Secondary | ICD-10-CM | POA: Diagnosis present

## 2013-09-24 LAB — CBC WITH DIFFERENTIAL/PLATELET
BASOS PCT: 0 % (ref 0–1)
Basophils Absolute: 0 10*3/uL (ref 0.0–0.1)
Eosinophils Absolute: 0 10*3/uL (ref 0.0–0.7)
Eosinophils Relative: 0 % (ref 0–5)
HEMATOCRIT: 39.2 % (ref 36.0–46.0)
Hemoglobin: 10.9 g/dL — ABNORMAL LOW (ref 12.0–15.0)
LYMPHS PCT: 12 % (ref 12–46)
Lymphs Abs: 0.9 10*3/uL (ref 0.7–4.0)
MCH: 20.7 pg — ABNORMAL LOW (ref 26.0–34.0)
MCHC: 27.8 g/dL — AB (ref 30.0–36.0)
MCV: 74.4 fL — ABNORMAL LOW (ref 78.0–100.0)
Monocytes Absolute: 0.6 10*3/uL (ref 0.1–1.0)
Monocytes Relative: 8 % (ref 3–12)
NEUTROS ABS: 5.7 10*3/uL (ref 1.7–7.7)
Neutrophils Relative %: 80 % — ABNORMAL HIGH (ref 43–77)
Platelets: 125 10*3/uL — ABNORMAL LOW (ref 150–400)
RBC: 5.27 MIL/uL — ABNORMAL HIGH (ref 3.87–5.11)
RDW: 21 % — ABNORMAL HIGH (ref 11.5–15.5)
WBC: 7.2 10*3/uL (ref 4.0–10.5)

## 2013-09-24 LAB — BASIC METABOLIC PANEL
Anion gap: 11 (ref 5–15)
BUN: 8 mg/dL (ref 6–23)
CHLORIDE: 103 meq/L (ref 96–112)
CO2: 31 mEq/L (ref 19–32)
Calcium: 9.1 mg/dL (ref 8.4–10.5)
Creatinine, Ser: 0.55 mg/dL (ref 0.50–1.10)
GFR calc Af Amer: >90 mL/min (ref >90)
GLUCOSE: 98 mg/dL (ref 70–99)
POTASSIUM: 3.9 meq/L (ref 3.7–5.3)
SODIUM: 145 meq/L (ref 137–147)

## 2013-09-24 LAB — PRO B NATRIURETIC PEPTIDE: PRO B NATRI PEPTIDE: 809.3 pg/mL — AB (ref 0–125)

## 2013-09-24 LAB — I-STAT TROPONIN, ED: TROPONIN I, POC: 0 ng/mL (ref 0.00–0.08)

## 2013-09-24 MED ORDER — IPRATROPIUM-ALBUTEROL 0.5-2.5 (3) MG/3ML IN SOLN
3.0000 mL | Freq: Once | RESPIRATORY_TRACT | Status: AC
  Administered 2013-09-24: 3 mL via RESPIRATORY_TRACT
  Filled 2013-09-24: qty 3

## 2013-09-24 NOTE — Progress Notes (Signed)
  CARE MANAGEMENT ED NOTE 09/24/2013  Patient:  [name] [name], [name] [name]   Account Number:  [id number]  Date Initiated:  09/24/2013  Documentation initiated by:  Edwyna Shell  Subjective/Objective Assessment:   73 yo female presenting to the ED with SOB     Subjective/Objective Assessment Detail:     Action/Plan:   Patient will be discharged home with portable etank and [name] will come to patient house to resolve O2 concerns with equipment in the home   Action/Plan Detail:   Patient will be discharged with [facility] RN, PT, SW, and nurse aide.   Anticipated DC Date:       Status Recommendation to Physician:   Result of Recommendation:  Agreed    DC Planning Services  CM consult  Other    Choice offered to / List presented to:       DME agency  [facility] arranged  HH-1 RN  [facility]      [facility] agency  [facility]    Status of service:  Completed, signed off  ED Comments:   ED Comments Detail:  CM consulted to assist with DME concerns. This CM spoke with the patient and the patient's son. [name] [name] and DIL, [name] [name]. The patient and family stated that the O2 system at home stopped working and the patient stated that she called [name] and they never came and she ran out of O2 and was without for 3 days and that is why she came in with SOB. This CM then called and spoke with [name] at [facility] and he stated that the patient has a HomeFill, E-tanks, and an M60, he also stated that there is not a documented phone call. This CM then spoke with the patient and family and explained that [facility] will try to trouble shoot O2 problems over the phone and if this cannot be resolved they will then send out a technician. This CM explained that the in house [facility] rep will deliver an etank to the room for the patient to travel home with and then upon discharge this CM will call [facility] to have a technician come out to the house and trouble shoot the HomeFill  and replace any etanks as necessary. This CM also spoke with the patient and family regarding [facility] services. They stated that the patient had [name] with [facility] but they stopped coming out 3 weeks ago. The family stated that they liked [facility] services and would like to continue using them if possible. The CM then spoke with [name] in home rep, [name], and she stated that they have provided care on and off off the patient in the past and she stated that they would be able to initiate services again. This CM then updated the family that the patient will receive HH RN, SW, PT, and aide with [facility]. Patient and family agreeable to plan, verbalized understanding, and had no other questions or concerns. ED PA, Quincy Carnes, and Rolene Arbour, staff nurse, updated.

## 2013-09-24 NOTE — ED Notes (Signed)
TO ED via [facility] from home, with c/o shortness of breath--is on O2 continuously at home, but pt states that O2 has not been working right and has been out of oxygen for three days. Has called [facility], but hasn't been fixed yet. No resp distress at present.

## 2013-09-24 NOTE — Discharge Instructions (Signed)
[facility] should be coming to your house to adjust oxygen tanks. Home health will also be coming to evaluate you for possible home health aide. Follow-up with your primary care physician. Return to the ED for new or worsening symptoms.

## 2013-09-24 NOTE — ED Provider Notes (Signed)
CSN: [id number]     Arrival date & time 09/24/13  7371 History   First MD Initiated Contact with Patient 09/24/13 0914     Chief Complaint  Patient presents with  . Shortness of Breath     (Consider location/radiation/quality/duration/timing/severity/associated sxs/prior Treatment) Patient is a 73 y.o. female presenting with shortness of breath. The history is provided by the patient and medical records.  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 Patient states her home oxygen home has not been working correctly for the past 3 days. She is supplemental oxygen-dependent, on 2 L at baseline. States since she's been without her oxygen she has been feeling increasingly short of breath. She does note a dry cough and chills, but denies fever. She denies any chest pain, palpitations, dizziness, or weakness.  Patient has contacted advance home care several times, but they have yet to come to her home to adjust her equipment.  VS stable on arrival.    Past Medical History  Diagnosis Date  . Hypertension   . Myocardial infarction   . Shortness of breath   . Asthma   . COPD (chronic obstructive pulmonary disease)     emphysema    . Pneumonia     hx pneumonia ,chronic bronchitis   . H/O blood clots   . Chronic kidney disease     current   UTI   being treated  . GERD (gastroesophageal reflux disease)   . Headache(784.0)     hx migraines  . Blood dyscrasia     hx blood clotts  . Arthritis    Past Surgical History  Procedure Laterality Date  . Right leg    . Hip fracture surgery      RIGHT SIDE  . Shoulder surgery      RIGHT  . Cholecystectomy    . Eye surgery      BIL CATARACT   . Orif wrist fracture  10/13/2011    Procedure: OPEN REDUCTION INTERNAL FIXATION (ORIF) WRIST  FRACTURE;  Surgeon: [name], MD;  Location: [place];  Service: Orthopedics;  Laterality: Right;  Open Reduction Internal Fixation Right Distal Radius   . Abdominal hysterectomy    . Btl    . Hardware removal  01/29/2012    Procedure: HARDWARE REMOVAL;  Surgeon: [name], MD;  Location: [place] [facility];  Service: Orthopedics;  Laterality: Right;  . Hip arthroplasty  01/29/2012    Procedure: ARTHROPLASTY BIPOLAR HIP;  Surgeon: [name], MD;  Location: [place] [facility];  Service: Orthopedics;  Laterality: Right;   Family History  Problem Relation Age of Onset  . Cancer - Lung Mother   . Coronary artery disease Mother   . Coronary artery disease Sister   . Coronary artery disease Brother    History  Substance Use Topics  . Smoking status: Current Every Day Smoker -- 1.00 packs/day for 50 years    Types: Cigarettes  . Smokeless tobacco: Not on file  . Alcohol Use: No   OB History   Grav Para Term Preterm Abortions TAB SAB Ect Mult Living                 Review of Systems  Respiratory: Positive for cough and shortness of breath.   All other systems reviewed and are negative.  Allergies  Review of patient's allergies indicates no known allergies.  Home Medications   Prior to Admission medications   Medication Sig Start Date End Date Taking? Authorizing Provider  albuterol (PROVENTIL) (2.5 MG/3ML) 0.083% nebulizer solution Take 2.5 mg by nebulization every 6 (six) hours.   Yes Historical Provider, MD  atorvastatin (LIPITOR) 40 MG tablet Take 40 mg by mouth daily.  07/02/13  Yes Historical Provider, MD  budesonide-formoterol (SYMBICORT) 160-4.5 MCG/ACT inhaler Inhale 2 puffs into the lungs 2 (two) times daily. 08/06/13  Yes Barton Dubois, MD  digoxin (LANOXIN) 0.25 MG tablet Take 0.25 mg by mouth daily.   Yes Historical Provider, MD  diltiazem (DILACOR XR) 240 MG 24 hr capsule Take 240 mg by mouth daily.  06/29/13  Yes Historical Provider, MD  docusate sodium (COLACE) 100 MG capsule Take  100 mg by mouth daily as needed (constipation).    Yes Historical Provider, MD  fish oil-omega-3 fatty acids 1000 MG capsule Take 1 g by mouth daily.   Yes Historical Provider, MD  fluticasone (FLONASE) 50 MCG/ACT nasal spray Place 1 spray into both nostrils daily as needed for allergies or rhinitis. 08/06/13  Yes Barton Dubois, MD  furosemide (LASIX) 40 MG tablet Take 0.5 tablets (20 mg total) by mouth daily. 08/06/13  Yes Barton Dubois, MD  iron polysaccharides (NIFEREX) 150 MG capsule Take 1 capsule (150 mg total) by mouth 2 (two) times daily. 08/06/13  Yes Barton Dubois, MD  loperamide (IMODIUM) 2 MG capsule Take 2 mg by mouth 2 (two) times daily as needed for diarrhea or loose stools.    Yes Historical Provider, MD  metoprolol succinate (TOPROL-XL) 25 MG 24 hr tablet Take 1 tablet (25 mg total) by mouth daily. Take with or immediately following a meal. 08/06/13  Yes Barton Dubois, MD  neomycin-bacitracin-polymyxin (NEOSPORIN) OINT Apply 1 application topically 3 (three) times daily as needed for wound care.   Yes Historical Provider, MD  OVER THE COUNTER MEDICATION Take 1 tablet by mouth at bedtime. Hyland's Leg Cramps PM for sleep and leg cramps   Yes Historical Provider, MD  oxybutynin (DITROPAN) 5 MG tablet Take 10 mg by mouth at bedtime.  06/12/13  Yes Historical Provider, MD  pantoprazole (PROTONIX) 40 MG tablet Take 40 mg by mouth daily.  06/12/13  Yes Historical Provider, MD  rivaroxaban (XARELTO) 20 MG TABS tablet Take 1 tablet (20 mg total) by mouth daily with supper. Hold for 10 days and then resume medication as previously prescribed. 08/06/13  Yes Barton Dubois, MD  tiotropium (SPIRIVA) 18 MCG inhalation capsule Place 1 capsule (18 mcg total) into inhaler and inhale daily. 08/06/13  Yes Barton Dubois, MD  traMADol (ULTRAM) 50 MG tablet Take 50-100 mg by mouth 2 (two) times daily as needed (pain).    Yes Historical Provider, MD  vitamin B-12 (CYANOCOBALAMIN) 500 MCG tablet Take 500 mcg by mouth  daily.   Yes Historical Provider, MD   BP 169/56  Pulse 61  Temp(Src) 97.1 F (36.2 C) (Oral)  Resp 20  Wt 121 lb (54.885 kg)  SpO2 100%  Physical Exam  Nursing note and vitals reviewed. Constitutional: She is oriented to person, place, and time. She appears well-developed and well-nourished.  Elderly  HENT:  Head: Normocephalic and atraumatic.  Mouth/Throat: Uvula is midline, oropharynx is clear and moist and mucous membranes are normal.  Bilateral hearing aids in place, oropharynx clear  Eyes: Conjunctivae and EOM are normal. Pupils are equal, round, and reactive to light.  Neck: Normal range of motion.  Cardiovascular: Normal rate, regular rhythm and normal heart sounds.   Pulmonary/Chest: Effort normal. She has wheezes. She has no rhonchi.  Respirations unlabored, wheezes noted at bilateral upper lobes, speaking in full complete sentences without difficulty  Abdominal: Soft. Bowel sounds are normal.  Musculoskeletal: Normal range of motion.  Neurological: She is alert and oriented to person, place, and time.  Skin: Skin is warm and dry.  Psychiatric: She has a normal mood and affect.    ED Course  Procedures (including critical care time) Labs Review Labs Reviewed  CBC WITH DIFFERENTIAL - Abnormal; Notable for the following:    RBC 5.27 (*)    Hemoglobin 10.9 (*)    MCV 74.4 (*)    MCH 20.7 (*)    MCHC 27.8 (*)    RDW 21.0 (*)    Platelets 125 (*)    Neutrophils Relative % 80 (*)    All other components within normal limits  PRO B NATRIURETIC PEPTIDE - Abnormal; Notable for the following:    Pro B Natriuretic peptide (BNP) 809.3 (*)    All other components within normal limits  BASIC METABOLIC PANEL  Randolm Idol, ED    Imaging Review Dg Chest 2 View  09/24/2013   CLINICAL DATA:  Shortness of breath. Intermittent chest pressure. COPD.  EXAM: CHEST  2 VIEW  COMPARISON:  08/03/2013 and 07/08/2013  FINDINGS: There are no acute infiltrates. Heart size and  vascularity are normal. There is superior retraction of the hila due to scarring in both upper lobes. Postsurgical changes in the right apex.  The patient does have tiny new bilateral pleural effusions. No acute osseous abnormality.  IMPRESSION: New tiny bilateral pleural effusions superimposed on chronic lung disease.   Electronically Signed   By: Rozetta Nunnery M.D.   On: 09/24/2013 10:57     EKG Interpretation   Date/Time:  Monday September 24 2013 09:16:17 EDT Ventricular Rate:  61 PR Interval:  166 QRS Duration: 91 QT Interval:  379 QTC Calculation: 382 R Axis:   75 Text Interpretation:  Sinus rhythm RSR' in V1 or V2, probably normal  variant LVH with secondary repolarization abnormality No significant  change v. 07-09-2013 Confirmed by Jeneen Rinks  MD, [place] ([id number]) on 09/24/2013  9:24:42 AM      MDM   Final diagnoses:  Shortness of breath  Dependence on supplemental oxygen   73 year old female with shortness of breath 2 oxygen take malfunctioning at home over the past 3 days.  On arrival, patient's O2 sats are stable on room air at 94%. Her respirations are unlabored. She does have faint wheezes at bilateral upper lobes. She endorses dry cough, but no fever, chills, or chest pain.  Will do the workup including EKG, labs, and chest x-ray.  Patient placed back on her baseline 2 L supplemental oxygen with improvement of O2 sats to 100%.  duoneb given.  After the neb tx, wheezes have resolved. EKG sinus rhythm without ischemic changes. Chest x-ray with tiny bilateral pleural effusions, no acute infiltrate. Labs with elevation of BNP, consistent with known mild CHF.  Pt is currently on lasix.  Patient appears at her baseline and has remained without any respiratory distress. At this time I have low suspicion for ACS, PE, dissection, or other acute cardiac event. She can be safely discharged with close PCP followup. Care management has evaluated patient and new O2 tank has been delivered to pt in the ED  for transport home, [facility]  home care will be out today to assess her equipment. Face-face home health evaluation has been ordered for possible home health aid assistance at family's request.  Discussed plan with patient, he/she acknowledged understanding and agreed with plan of care.  Return precautions given for new or worsening symptoms.  Larene Pickett, PA-C 09/24/13 1526

## 2013-09-24 NOTE — ED Notes (Signed)
Pt's son into room, speaking very loud and rapid, telling pt "you should call me, you know I care about you and no one else does. I don't know why you don't call me. We want you to move closer to [place] and you refuse to. It makes it difficult to take care of you! We are going to give it to the [place] and let him decide."

## 2013-09-26 ENCOUNTER — Encounter (HOSPITAL_COMMUNITY): Payer: Self-pay | Admitting: Emergency Medicine

## 2013-09-26 ENCOUNTER — Emergency Department (HOSPITAL_COMMUNITY)
Admission: EM | Admit: 2013-09-26 | Discharge: 2013-09-26 | Disposition: A | Discharge status: Home / Self Care | Payer: Medicare HMO | Attending: Emergency Medicine | Admitting: Emergency Medicine

## 2013-09-26 ENCOUNTER — Emergency Department (HOSPITAL_COMMUNITY): Payer: Medicare HMO

## 2013-09-26 DIAGNOSIS — Z87891 Personal history of nicotine dependence: Secondary | ICD-10-CM | POA: Diagnosis not present

## 2013-09-26 DIAGNOSIS — IMO0002 Reserved for concepts with insufficient information to code with codable children: Secondary | ICD-10-CM | POA: Insufficient documentation

## 2013-09-26 DIAGNOSIS — I252 Old myocardial infarction: Secondary | ICD-10-CM | POA: Insufficient documentation

## 2013-09-26 DIAGNOSIS — Z862 Personal history of diseases of the blood and blood-forming organs and certain disorders involving the immune mechanism: Secondary | ICD-10-CM | POA: Insufficient documentation

## 2013-09-26 DIAGNOSIS — K219 Gastro-esophageal reflux disease without esophagitis: Secondary | ICD-10-CM | POA: Diagnosis not present

## 2013-09-26 DIAGNOSIS — Z86718 Personal history of other venous thrombosis and embolism: Secondary | ICD-10-CM | POA: Insufficient documentation

## 2013-09-26 DIAGNOSIS — Z8739 Personal history of other diseases of the musculoskeletal system and connective tissue: Secondary | ICD-10-CM | POA: Diagnosis not present

## 2013-09-26 DIAGNOSIS — R0602 Shortness of breath: Secondary | ICD-10-CM

## 2013-09-26 DIAGNOSIS — Z8701 Personal history of pneumonia (recurrent): Secondary | ICD-10-CM | POA: Diagnosis not present

## 2013-09-26 DIAGNOSIS — J441 Chronic obstructive pulmonary disease with (acute) exacerbation: Secondary | ICD-10-CM | POA: Diagnosis not present

## 2013-09-26 DIAGNOSIS — I129 Hypertensive chronic kidney disease with stage 1 through stage 4 chronic kidney disease, or unspecified chronic kidney disease: Secondary | ICD-10-CM | POA: Diagnosis not present

## 2013-09-26 DIAGNOSIS — N189 Chronic kidney disease, unspecified: Secondary | ICD-10-CM | POA: Diagnosis not present

## 2013-09-26 DIAGNOSIS — Z79899 Other long term (current) drug therapy: Secondary | ICD-10-CM | POA: Diagnosis not present

## 2013-09-26 DIAGNOSIS — Z7901 Long term (current) use of anticoagulants: Secondary | ICD-10-CM | POA: Insufficient documentation

## 2013-09-26 DIAGNOSIS — J45901 Unspecified asthma with (acute) exacerbation: Principal | ICD-10-CM

## 2013-09-26 DIAGNOSIS — Z792 Long term (current) use of antibiotics: Secondary | ICD-10-CM | POA: Insufficient documentation

## 2013-09-26 LAB — CBC WITH DIFFERENTIAL/PLATELET
BASOS ABS: 0 10*3/uL (ref 0.0–0.1)
Basophils Relative: 1 % (ref 0–1)
EOS ABS: 0.1 10*3/uL (ref 0.0–0.7)
EOS PCT: 1 % (ref 0–5)
HEMATOCRIT: 39.4 % (ref 36.0–46.0)
Hemoglobin: 10.8 g/dL — ABNORMAL LOW (ref 12.0–15.0)
Lymphocytes Relative: 19 % (ref 12–46)
Lymphs Abs: 1.4 10*3/uL (ref 0.7–4.0)
MCH: 20.8 pg — AB (ref 26.0–34.0)
MCHC: 27.4 g/dL — ABNORMAL LOW (ref 30.0–36.0)
MCV: 75.9 fL — AB (ref 78.0–100.0)
Monocytes Absolute: 1.1 10*3/uL — ABNORMAL HIGH (ref 0.1–1.0)
Monocytes Relative: 15 % — ABNORMAL HIGH (ref 3–12)
Neutro Abs: 4.7 10*3/uL (ref 1.7–7.7)
Neutrophils Relative %: 64 % (ref 43–77)
PLATELETS: ADEQUATE 10*3/uL (ref 150–400)
RBC: 5.19 MIL/uL — ABNORMAL HIGH (ref 3.87–5.11)
RDW: 20.9 % — AB (ref 11.5–15.5)
WBC: 7.3 10*3/uL (ref 4.0–10.5)

## 2013-09-26 LAB — COMPREHENSIVE METABOLIC PANEL
ALK PHOS: 79 U/L (ref 39–117)
ALT: 12 U/L (ref 0–35)
ANION GAP: 10 (ref 5–15)
AST: 18 U/L (ref 0–37)
Albumin: 3.6 g/dL (ref 3.5–5.2)
BUN: 9 mg/dL (ref 6–23)
CHLORIDE: 101 meq/L (ref 96–112)
CO2: 33 mEq/L — ABNORMAL HIGH (ref 19–32)
CREATININE: 0.56 mg/dL (ref 0.50–1.10)
Calcium: 9.2 mg/dL (ref 8.4–10.5)
GFR calc Af Amer: >90 mL/min (ref >90)
GFR calc non Af Amer: >90 mL/min (ref >90)
Glucose, Bld: 98 mg/dL (ref 70–99)
POTASSIUM: 4.3 meq/L (ref 3.7–5.3)
Sodium: 144 mEq/L (ref 137–147)
Total Bilirubin: 0.3 mg/dL (ref 0.3–1.2)
Total Protein: 7.3 g/dL (ref 6.0–8.3)

## 2013-09-26 LAB — PRO B NATRIURETIC PEPTIDE: Pro B Natriuretic peptide (BNP): 742.8 pg/mL — ABNORMAL HIGH (ref 0–125)

## 2013-09-26 LAB — I-STAT TROPONIN, ED: Troponin i, poc: 0.01 ng/mL (ref 0.00–0.08)

## 2013-09-26 LAB — DIGOXIN LEVEL: Digoxin Level: 1.4 ng/mL (ref 0.8–2.0)

## 2013-09-26 MED ORDER — PREDNISONE 20 MG PO TABS
60.0000 mg | ORAL_TABLET | Freq: Once | ORAL | Status: AC
  Administered 2013-09-26: 60 mg via ORAL
  Filled 2013-09-26: qty 3

## 2013-09-26 MED ORDER — ALBUTEROL SULFATE (2.5 MG/3ML) 0.083% IN NEBU
5.0000 mg | INHALATION_SOLUTION | Freq: Once | RESPIRATORY_TRACT | Status: AC
  Administered 2013-09-26: 5 mg via RESPIRATORY_TRACT
  Filled 2013-09-26: qty 6

## 2013-09-26 NOTE — ED Notes (Signed)
Tatyana, PA at bedside. 

## 2013-09-26 NOTE — ED Notes (Signed)
Family notified pt being discharged. Family will not be able to pick up pt until 2100.

## 2013-09-26 NOTE — ED Notes (Signed)
Notified [facility] for transportation back home

## 2013-09-26 NOTE — ED Notes (Signed)
VSS. NAD noted. Pt denies pain. IV taken out. Pt given discharge instructions. All questions answered. Spoke with daughter and notified her that she will be discharge. Family will not be able to take pt home until 2100. Soil scientist. [facility] called.

## 2013-09-26 NOTE — Discharge Instructions (Signed)
Do neb treatments every 4 hrs now instead of twice a day. Follow up with primary care doctor tomorrow or Friday. Return if worsening.   Chronic Obstructive Pulmonary Disease Chronic obstructive pulmonary disease (COPD) is a common lung condition in which airflow from the lungs is limited. COPD is a general term that can be used to describe many different lung problems that limit airflow, including both chronic bronchitis and emphysema. If you have COPD, your lung function will probably never return to normal, but there are measures you can take to improve lung function and make yourself feel better.  CAUSES   Smoking (common).   Exposure to secondhand smoke.   Genetic problems.  Chronic inflammatory lung diseases or recurrent infections. SYMPTOMS   Shortness of breath, especially with physical activity.   Deep, persistent (chronic) cough with a large amount of thick mucus.   Wheezing.   Rapid breaths (tachypnea).   Gray or bluish discoloration (cyanosis) of the skin, especially in fingers, toes, or lips.   Fatigue.   Weight loss.   Frequent infections or episodes when breathing symptoms become much worse (exacerbations).   Chest tightness. DIAGNOSIS  Your healthcare provider will take a medical history and perform a physical examination to make the initial diagnosis. Additional tests for COPD may include:   Lung (pulmonary) function tests.  Chest X-ray.  CT scan.  Blood tests. TREATMENT  Treatment available to help you feel better when you have COPD include:   Inhaler and nebulizer medicines. These help manage the symptoms of COPD and make your breathing more comfortable  Supplemental oxygen. Supplemental oxygen is only helpful if you have a low oxygen level in your blood.   Exercise and physical activity. These are beneficial for nearly all people with COPD. Some people may also benefit from a pulmonary rehabilitation program. HOME CARE INSTRUCTIONS    Take all medicines (inhaled or pills) as directed by your health care provider.  Only take over-the-counter or prescription medicines for pain, fever, or discomfort as directed by your health care provider.   Avoid over-the-counter medicines or cough syrups that dry up your airway (such as antihistamines) and slow down the elimination of secretions unless instructed otherwise by your healthcare provider.   If you are a smoker, the most important thing that you can do is stop smoking. Continuing to smoke will cause further lung damage and breathing trouble. Ask your health care provider for help with quitting smoking. He or she can direct you to community resources or hospitals that provide support.  Avoid exposure to irritants such as smoke, chemicals, and fumes that aggravate your breathing.  Use oxygen therapy and pulmonary rehabilitation if directed by your health care provider. If you require home oxygen therapy, ask your healthcare provider whether you should purchase a pulse oximeter to measure your oxygen level at home.   Avoid contact with individuals who have a contagious illness.  Avoid extreme temperature and humidity changes.  Eat healthy foods. Eating smaller, more frequent meals and resting before meals may help you maintain your strength.  Stay active, but balance activity with periods of rest. Exercise and physical activity will help you maintain your ability to do things you want to do.  Preventing infection and hospitalization is very important when you have COPD. Make sure to receive all the vaccines your health care provider recommends, especially the pneumococcal and influenza vaccines. Ask your healthcare provider whether you need a pneumonia vaccine.  Learn and use relaxation techniques to manage stress.  Learn and use controlled breathing techniques as directed by your health care provider. Controlled breathing techniques include:   Pursed lip breathing.  Start by breathing in (inhaling) through your nose for 1 second. Then, purse your lips as if you were going to whistle and breathe out (exhale) through the pursed lips for 2 seconds.   Diaphragmatic breathing. Start by putting one hand on your abdomen just above your waist. Inhale slowly through your nose. The hand on your abdomen should move out. Then purse your lips and exhale slowly. You should be able to feel the hand on your abdomen moving in as you exhale.   Learn and use controlled coughing to clear mucus from your lungs. Controlled coughing is a series of short, progressive coughs. The steps of controlled coughing are:  1. Lean your head slightly forward.  2. Breathe in deeply using diaphragmatic breathing.  3. Try to hold your breath for 3 seconds.  4. Keep your mouth slightly open while coughing twice.  5. Spit any mucus out into a tissue.  6. Rest and repeat the steps once or twice as needed. SEEK MEDICAL CARE IF:   You are coughing up more mucus than usual.   There is a change in the color or thickness of your mucus.   Your breathing is more labored than usual.   Your breathing is faster than usual.  SEEK IMMEDIATE MEDICAL CARE IF:   You have shortness of breath while you are resting.   You have shortness of breath that prevents you from:  Being able to talk.   Performing your usual physical activities.   You have chest pain lasting longer than 5 minutes.   Your skin color is more cyanotic than usual.  You measure low oxygen saturations for longer than 5 minutes with a pulse oximeter. MAKE SURE YOU:   Understand these instructions.  Will watch your condition.  Will get help right away if you are not doing well or get worse. Document Released: 12/09/2004 Document Revised: 12/20/2012 Document Reviewed: 10/26/2012 [facility] Patient Information 2015 [place], [place]. This information is not intended to replace advice given to you by your health care  provider. Make sure you discuss any questions you have with your health care provider.

## 2013-09-26 NOTE — ED Notes (Signed)
Per EMS pt was at home alone, c/o shortness of breath. Pt A&Ox4, no LOC, BP 156/63, P56, O2 96% on 2L nasal cannula at home. Pt was seen in ED about 3 weeks ago for shortness of breath. Pt gave herself a breathing txt at 0900. EMS gave pt Albuterol 2.5mg  breathing txt prior to arrival of hospital. Pt O2 sats 100%. Pt has h/o Afib, COPD, HTN, MI x1 stent.

## 2013-09-26 NOTE — ED Notes (Signed)
Pt denies chest pain, N/V, or weakness. Pt comfortable on 2L O2 via nasal cannula. O2 sats maintained at 100%

## 2013-09-26 NOTE — ED Notes (Signed)
Phlebotomy at bedside.

## 2013-09-26 NOTE — ED Provider Notes (Signed)
CSN: [id number]     Arrival date & time 09/26/13  1212 History   First MD Initiated Contact with Patient 09/26/13 1215     Chief Complaint  Patient presents with  . Shortness of Breath     (Consider location/radiation/quality/duration/timing/severity/associated sxs/prior Treatment) HPI [name] [name] is a 73 y.o. female with hx of CAD, COPD, CKD, HTN, presents to ED with complaint of SOB. States she was at home when began having chest tightness and difficulty breathing. States she already had a neb treatment this morning. States she used her inhaler 3 times before calling EMS. Patient states he lives at home alone. She admits to cough over last several days. States it is nonproductive. She was given another breathing treatment by EMS and states she feels slightly better. States she's feeling better also with oxygen on. Patient was found by an EMS with normal oxygen saturation, in no distress.  Past Medical History  Diagnosis Date  . Hypertension   . Myocardial infarction   . Shortness of breath   . Asthma   . COPD (chronic obstructive pulmonary disease)     emphysema    . Pneumonia     hx pneumonia ,chronic bronchitis   . H/O blood clots   . Chronic kidney disease     current   UTI   being treated  . GERD (gastroesophageal reflux disease)   . Headache(784.0)     hx migraines  . Blood dyscrasia     hx blood clotts  . Arthritis    Past Surgical History  Procedure Laterality Date  . Right leg    . Hip fracture surgery      RIGHT SIDE  . Shoulder surgery      RIGHT  . Cholecystectomy    . Eye surgery      BIL CATARACT   . Orif wrist fracture  10/13/2011    Procedure: OPEN REDUCTION INTERNAL FIXATION (ORIF) WRIST FRACTURE;  Surgeon: [name], MD;  Location: [facility];  Service: Orthopedics;  Laterality: Right;  Open Reduction Internal Fixation Right Distal Radius   . Abdominal hysterectomy    . Btl    . Hardware removal  01/29/2012    Procedure: HARDWARE REMOVAL;  Surgeon:  [name], MD;  Location: [facility];  Service: Orthopedics;  Laterality: Right;  . Hip arthroplasty  01/29/2012    Procedure: ARTHROPLASTY BIPOLAR HIP;  Surgeon: [name], MD;  Location: [facility];  Service: Orthopedics;  Laterality: Right;   Family History  Problem Relation Age of Onset  . Cancer - Lung Mother   . Coronary artery disease Mother   . Coronary artery disease Sister   . Coronary artery disease Brother    History  Substance Use Topics  . Smoking status: Former Smoker -- 1.00 packs/day for 50 years    Types: Cigarettes    Quit date: 06/27/2013  . Smokeless tobacco: Not on file  . Alcohol Use: No   OB History   Grav Para Term Preterm Abortions TAB SAB Ect Mult Living                 Review of Systems  Constitutional: Negative for fever and chills.  Respiratory: Positive for cough, chest tightness and shortness of breath.   Cardiovascular: Positive for chest pain. Negative for palpitations and leg swelling.  Gastrointestinal: Negative for nausea, vomiting, abdominal pain and diarrhea.  Genitourinary: Negative for dysuria, flank pain and pelvic pain.  Musculoskeletal: Negative for arthralgias, myalgias,  neck pain and neck stiffness.  Skin: Negative for rash.  Neurological: Negative for dizziness, weakness and headaches.  All other systems reviewed and are negative.     Allergies  Review of patient's allergies indicates no known allergies.  Home Medications   Prior to Admission medications   Medication Sig Start Date End Date Taking? Authorizing Provider  albuterol (PROVENTIL) (2.5 MG/3ML) 0.083% nebulizer solution Take 2.5 mg by nebulization every 6 (six) hours.    Historical Provider, MD  atorvastatin (LIPITOR) 40 MG tablet Take 40 mg by mouth daily.  07/02/13   Historical Provider, MD  budesonide-formoterol (SYMBICORT) 160-4.5 MCG/ACT inhaler Inhale 2 puffs into the lungs 2 (two) times daily. 08/06/13   Barton Dubois, MD  digoxin (LANOXIN) 0.25 MG tablet Take  0.25 mg by mouth daily.    Historical Provider, MD  diltiazem (DILACOR XR) 240 MG 24 hr capsule Take 240 mg by mouth daily.  06/29/13   Historical Provider, MD  docusate sodium (COLACE) 100 MG capsule Take 100 mg by mouth daily as needed (constipation).     Historical Provider, MD  fish oil-omega-3 fatty acids 1000 MG capsule Take 1 g by mouth daily.    Historical Provider, MD  fluticasone (FLONASE) 50 MCG/ACT nasal spray Place 1 spray into both nostrils daily as needed for allergies or rhinitis. 08/06/13   Barton Dubois, MD  furosemide (LASIX) 40 MG tablet Take 0.5 tablets (20 mg total) by mouth daily. 08/06/13   Barton Dubois, MD  iron polysaccharides (NIFEREX) 150 MG capsule Take 1 capsule (150 mg total) by mouth 2 (two) times daily. 08/06/13   Barton Dubois, MD  loperamide (IMODIUM) 2 MG capsule Take 2 mg by mouth 2 (two) times daily as needed for diarrhea or loose stools.     Historical Provider, MD  metoprolol succinate (TOPROL-XL) 25 MG 24 hr tablet Take 1 tablet (25 mg total) by mouth daily. Take with or immediately following a meal. 08/06/13   Barton Dubois, MD  neomycin-bacitracin-polymyxin (NEOSPORIN) OINT Apply 1 application topically 3 (three) times daily as needed for wound care.    Historical Provider, MD  OVER THE COUNTER MEDICATION Take 1 tablet by mouth at bedtime. Hyland's Leg Cramps PM for sleep and leg cramps    Historical Provider, MD  oxybutynin (DITROPAN) 5 MG tablet Take 10 mg by mouth at bedtime.  06/12/13   Historical Provider, MD  pantoprazole (PROTONIX) 40 MG tablet Take 40 mg by mouth daily.  06/12/13   Historical Provider, MD  rivaroxaban (XARELTO) 20 MG TABS tablet Take 1 tablet (20 mg total) by mouth daily with supper. Hold for 10 days and then resume medication as previously prescribed. 08/06/13   Barton Dubois, MD  tiotropium (SPIRIVA) 18 MCG inhalation capsule Place 1 capsule (18 mcg total) into inhaler and inhale daily. 08/06/13   Barton Dubois, MD  traMADol (ULTRAM) 50 MG  tablet Take 50-100 mg by mouth 2 (two) times daily as needed (pain).     Historical Provider, MD  vitamin B-12 (CYANOCOBALAMIN) 500 MCG tablet Take 500 mcg by mouth daily.    Historical Provider, MD   BP 174/51  Pulse 58  Temp(Src) 97.9 F (36.6 C) (Oral)  Resp 17  Ht 5\' 5"  (1.651 m)  Wt 117 lb (53.071 kg)  BMI 19.47 kg/m2  SpO2 100% Physical Exam  Nursing note and vitals reviewed. Constitutional: She is oriented to person, place, and time. She appears well-developed and well-nourished. No distress.  HENT:  Head: Normocephalic.  Eyes:  Conjunctivae are normal.  Neck: Neck supple.  Cardiovascular: Normal rate, regular rhythm, normal heart sounds and intact distal pulses.   Pulmonary/Chest: Effort normal. No respiratory distress. She has no wheezes. She has no rales.  Decreased air movement bilaterally  Abdominal: Soft. Bowel sounds are normal. She exhibits no distension. There is no tenderness. There is no rebound.  Musculoskeletal: She exhibits no edema.  Neurological: She is alert and oriented to person, place, and time.  Skin: Skin is warm and dry.  Psychiatric: She has a normal mood and affect. Her behavior is normal.    ED Course  Procedures (including critical care time) Labs Review Labs Reviewed  CBC WITH DIFFERENTIAL - Abnormal; Notable for the following:    RBC 5.19 (*)    Hemoglobin 10.8 (*)    MCV 75.9 (*)    MCH 20.8 (*)    MCHC 27.4 (*)    RDW 20.9 (*)    Monocytes Relative 15 (*)    Monocytes Absolute 1.1 (*)    All other components within normal limits  COMPREHENSIVE METABOLIC PANEL - Abnormal; Notable for the following:    CO2 33 (*)    All other components within normal limits  PRO B NATRIURETIC PEPTIDE - Abnormal; Notable for the following:    Pro B Natriuretic peptide (BNP) 742.8 (*)    All other components within normal limits  DIGOXIN LEVEL  I-STAT TROPOININ, ED    Imaging Review Dg Chest 2 View  09/26/2013   CLINICAL DATA:  Chest pain,  shortness of breath  EXAM: CHEST  2 VIEW  COMPARISON:  09/24/2013  FINDINGS: Posttreatment changes in the right upper lobe with upward retraction of the right hilum. No new focal parenchymal opacity. The lungs are hyperinflated likely secondary to COPD. No pleural effusion or pneumothorax. Stable cardiomediastinal silhouette. Unremarkable osseous structures.  IMPRESSION: No active cardiopulmonary disease.   Electronically Signed   By: Kathreen Devoid   On: 09/26/2013 14:21     EKG Interpretation   Date/Time:  Wednesday September 26 2013 12:22:17 EDT Ventricular Rate:  53 PR Interval:  163 QRS Duration: 85 QT Interval:  385 QTC Calculation: 361 R Axis:   78 Text Interpretation:  Sinus rhythm Left ventricular hypertrophy  Nonspecific T wave abnormality REPOLARIZATION ABNORMALITY No significant  change since last tracing Confirmed by Ashok Cordia  MD, Lennette Bihari ([id number]) on  09/26/2013 3:57:47 PM      MDM   Final diagnoses:  Shortness of breath    Pt with hx of COPD here for SOB. She has used her inhaler 3 times at home prior to coming in, received 1 neb by EMS. Pt is here with no acute distress. Speaking in full sentences. Diminished breath sounds, otherwise normal lung exam. She is afebrile, non toxic appearing. Will get labs, CXR, ECG, trop.    4:03 PM CXR negative. Labs at baseline. Pt received 2 nebs and prednisone. She is feeling better. She is able to carry normal conversation with no signs of shortness of breath. Her lungs are clear. She is not tachycardic, hypoxic, tachypnec, doubt PE. Her trop is negative, and she is not having any chest pain, doubt ACS.  Pt is on xarelto, she did received 60mg  prednisone here in ED. Will hold off on prednisone due to possibility of bleeding, with her hx of bleeding. Will start on neb treatments every 4 hrs for the next 3 days. Close follow up with PCP.   Filed Vitals:   09/26/13 1430 09/26/13 1445 09/26/13  1545 09/26/13 1609  BP: 170/51 163/72 155/91 172/57   Pulse: 56 69 60   Temp:      TempSrc:      Resp: 13 16 15 12   Height:      Weight:      SpO2: 100% 91% 93% 100%     Renold Genta, PA-C 09/26/13 1613

## 2013-10-02 NOTE — ED Provider Notes (Signed)
Medical screening examination/treatment/procedure(s) were performed by non-physician practitioner and as supervising physician I was immediately available for consultation/collaboration.   EKG Interpretation   Date/Time:  Monday September 24 2013 09:16:17 EDT Ventricular Rate:  61 PR Interval:  166 QRS Duration: 91 QT Interval:  379 QTC Calculation: 382 R Axis:   75 Text Interpretation:  Sinus rhythm RSR' in V1 or V2, probably normal  variant LVH with secondary repolarization abnormality No significant  change v. 07-09-2013 Confirmed by Jeneen Rinks  MD, [place] ([id number]) on 09/24/2013  9:24:42 AM        Tanna Furry, MD 10/02/13 [phone]

## 2013-10-03 NOTE — ED Provider Notes (Signed)
Medical screening examination/treatment/procedure(s) were conducted as a shared visit with non-physician practitioner(s) and myself.  I personally evaluated the patient during the encounter.   EKG Interpretation   Date/Time:  Wednesday September 26 2013 12:22:17 EDT Ventricular Rate:  53 PR Interval:  163 QRS Duration: 85 QT Interval:  385 QTC Calculation: 361 R Axis:   78 Text Interpretation:  Sinus rhythm Left ventricular hypertrophy  Nonspecific T wave abnormality REPOLARIZATION ABNORMALITY No significant  change since last tracing Confirmed by Ashok Cordia  MD, Lennette Bihari ([id number]) on  09/26/2013 3:57:47 PM      Pt with hx copd, c/o wheezing/sob.  Wheezing bil. Albuterol and atrovent neb. Cxr.     Mirna Mires, MD 10/03/13 [phone]

## 2013-10-14 ENCOUNTER — Encounter (HOSPITAL_COMMUNITY): Payer: Self-pay | Admitting: Emergency Medicine

## 2013-10-14 ENCOUNTER — Emergency Department (HOSPITAL_COMMUNITY): Payer: Medicare HMO

## 2013-10-14 ENCOUNTER — Emergency Department (HOSPITAL_COMMUNITY)
Admission: EM | Admit: 2013-10-14 | Discharge: 2013-10-14 | Disposition: A | Discharge status: Home / Self Care | Payer: Medicare HMO | Attending: Emergency Medicine | Admitting: Emergency Medicine

## 2013-10-14 DIAGNOSIS — I252 Old myocardial infarction: Secondary | ICD-10-CM | POA: Insufficient documentation

## 2013-10-14 DIAGNOSIS — N390 Urinary tract infection, site not specified: Secondary | ICD-10-CM | POA: Insufficient documentation

## 2013-10-14 DIAGNOSIS — R040 Epistaxis: Secondary | ICD-10-CM | POA: Diagnosis not present

## 2013-10-14 DIAGNOSIS — J4489 Other specified chronic obstructive pulmonary disease: Secondary | ICD-10-CM | POA: Insufficient documentation

## 2013-10-14 DIAGNOSIS — Z8701 Personal history of pneumonia (recurrent): Secondary | ICD-10-CM | POA: Diagnosis not present

## 2013-10-14 DIAGNOSIS — Z86718 Personal history of other venous thrombosis and embolism: Secondary | ICD-10-CM | POA: Diagnosis not present

## 2013-10-14 DIAGNOSIS — Z79899 Other long term (current) drug therapy: Secondary | ICD-10-CM | POA: Insufficient documentation

## 2013-10-14 DIAGNOSIS — R109 Unspecified abdominal pain: Secondary | ICD-10-CM | POA: Diagnosis not present

## 2013-10-14 DIAGNOSIS — D759 Disease of blood and blood-forming organs, unspecified: Secondary | ICD-10-CM | POA: Diagnosis not present

## 2013-10-14 DIAGNOSIS — N189 Chronic kidney disease, unspecified: Secondary | ICD-10-CM | POA: Diagnosis not present

## 2013-10-14 DIAGNOSIS — IMO0002 Reserved for concepts with insufficient information to code with codable children: Secondary | ICD-10-CM | POA: Diagnosis not present

## 2013-10-14 DIAGNOSIS — Z87891 Personal history of nicotine dependence: Secondary | ICD-10-CM | POA: Insufficient documentation

## 2013-10-14 DIAGNOSIS — I129 Hypertensive chronic kidney disease with stage 1 through stage 4 chronic kidney disease, or unspecified chronic kidney disease: Secondary | ICD-10-CM | POA: Diagnosis not present

## 2013-10-14 DIAGNOSIS — Z7901 Long term (current) use of anticoagulants: Secondary | ICD-10-CM | POA: Diagnosis not present

## 2013-10-14 DIAGNOSIS — Z8739 Personal history of other diseases of the musculoskeletal system and connective tissue: Secondary | ICD-10-CM | POA: Insufficient documentation

## 2013-10-14 DIAGNOSIS — J449 Chronic obstructive pulmonary disease, unspecified: Secondary | ICD-10-CM | POA: Insufficient documentation

## 2013-10-14 LAB — CBC WITH DIFFERENTIAL/PLATELET
BASOS ABS: 0 10*3/uL (ref 0.0–0.1)
BASOS PCT: 0 % (ref 0–1)
EOS ABS: 0.1 10*3/uL (ref 0.0–0.7)
EOS PCT: 2 % (ref 0–5)
HCT: 36.7 % (ref 36.0–46.0)
Hemoglobin: 10.1 g/dL — ABNORMAL LOW (ref 12.0–15.0)
Lymphocytes Relative: 26 % (ref 12–46)
Lymphs Abs: 1.4 10*3/uL (ref 0.7–4.0)
MCH: 20 pg — AB (ref 26.0–34.0)
MCHC: 27.5 g/dL — AB (ref 30.0–36.0)
MCV: 72.8 fL — AB (ref 78.0–100.0)
Monocytes Absolute: 0.6 10*3/uL (ref 0.1–1.0)
Monocytes Relative: 11 % (ref 3–12)
Neutro Abs: 3.2 10*3/uL (ref 1.7–7.7)
Neutrophils Relative %: 61 % (ref 43–77)
PLATELETS: 128 10*3/uL — AB (ref 150–400)
RBC: 5.04 MIL/uL (ref 3.87–5.11)
RDW: 20.5 % — ABNORMAL HIGH (ref 11.5–15.5)
WBC: 5.2 10*3/uL (ref 4.0–10.5)

## 2013-10-14 LAB — COMPREHENSIVE METABOLIC PANEL
ALBUMIN: 3.3 g/dL — AB (ref 3.5–5.2)
ALT: 10 U/L (ref 0–35)
AST: 19 U/L (ref 0–37)
Alkaline Phosphatase: 63 U/L (ref 39–117)
Anion gap: 8 (ref 5–15)
BUN: 11 mg/dL (ref 6–23)
CO2: 38 mEq/L — ABNORMAL HIGH (ref 19–32)
Calcium: 8.8 mg/dL (ref 8.4–10.5)
Chloride: 97 mEq/L (ref 96–112)
Creatinine, Ser: 0.63 mg/dL (ref 0.50–1.10)
GFR calc Af Amer: >90 mL/min (ref >90)
GFR calc non Af Amer: 87 mL/min — ABNORMAL LOW (ref >90)
Glucose, Bld: 95 mg/dL (ref 70–99)
Potassium: 3.8 mEq/L (ref 3.7–5.3)
SODIUM: 143 meq/L (ref 137–147)
TOTAL PROTEIN: 6.6 g/dL (ref 6.0–8.3)
Total Bilirubin: <0.2 mg/dL — ABNORMAL LOW (ref 0.3–1.2)

## 2013-10-14 LAB — URINALYSIS, ROUTINE W REFLEX MICROSCOPIC
BILIRUBIN URINE: NEGATIVE
Glucose, UA: NEGATIVE mg/dL
Ketones, ur: NEGATIVE mg/dL
NITRITE: POSITIVE — AB
Protein, ur: NEGATIVE mg/dL
SPECIFIC GRAVITY, URINE: 1.01 (ref 1.005–1.030)
UROBILINOGEN UA: 0.2 mg/dL (ref 0.0–1.0)
pH: 8.5 — ABNORMAL HIGH (ref 5.0–8.0)

## 2013-10-14 LAB — URINE MICROSCOPIC-ADD ON

## 2013-10-14 LAB — TROPONIN I: Troponin I: <0.3 ng/mL (ref <0.30)

## 2013-10-14 LAB — PROTIME-INR
INR: 1.08 (ref 0.00–1.49)
Prothrombin Time: 14 seconds (ref 11.6–15.2)

## 2013-10-14 LAB — POC OCCULT BLOOD, ED: FECAL OCCULT BLD: NEGATIVE

## 2013-10-14 MED ORDER — IOHEXOL 300 MG/ML  SOLN
100.0000 mL | Freq: Once | INTRAMUSCULAR | Status: AC | PRN
  Administered 2013-10-14: 100 mL via INTRAVENOUS

## 2013-10-14 MED ORDER — CEPHALEXIN 500 MG PO CAPS: 500.0000 mg | ORAL_CAPSULE | Freq: Four times a day (QID) | ORAL | Status: DC

## 2013-10-14 MED ORDER — IOHEXOL 300 MG/ML  SOLN
50.0000 mL | Freq: Once | INTRAMUSCULAR | Status: AC | PRN
  Administered 2013-10-14: 50 mL via ORAL

## 2013-10-14 MED ORDER — DEXTROSE 5 % IV SOLN
1.0000 g | Freq: Once | INTRAVENOUS | Status: AC
  Administered 2013-10-14: 1 g via INTRAVENOUS
  Filled 2013-10-14: qty 10

## 2013-10-14 MED ORDER — SODIUM CHLORIDE 0.9 % IV BOLUS (SEPSIS)
1000.0000 mL | Freq: Once | INTRAVENOUS | Status: AC
  Administered 2013-10-14: 1000 mL via INTRAVENOUS

## 2013-10-14 NOTE — ED Provider Notes (Signed)
CSN: [id number]     Arrival date & time 10/14/13  1328 History   First MD Initiated Contact with Patient 10/14/13 1500     Chief Complaint  Patient presents with  . Epistaxis     (Consider location/radiation/quality/duration/timing/severity/associated sxs/prior Treatment) HPI Comments: Patient reports intermittent nosebleeds for the past 2 weeks. She is on Xarelto history of blood clots. Patient also describes dark tarry stools for the past 2 weeks as well as blood in her urine. Notably she was admitted in May for an episode of hemorrhagic cystitis. She says she fills the toilet bowel with bright red blood when she urinates and he endorses lower abdominal pain that is worse with palpation and constant. She denies any chest pain or shortness of breath. She was generalized weakness and dizziness. She's never had a colonoscopy.  The history is provided by the patient.    Past Medical History  Diagnosis Date  . Hypertension   . Myocardial infarction   . Shortness of breath   . Asthma   . COPD (chronic obstructive pulmonary disease)     emphysema    . Pneumonia     hx pneumonia ,chronic bronchitis   . H/O blood clots   . Chronic kidney disease     current   UTI   being treated  . GERD (gastroesophageal reflux disease)   . Headache(784.0)     hx migraines  . Blood dyscrasia     hx blood clotts  . Arthritis    Past Surgical History  Procedure Laterality Date  . Right leg    . Hip fracture surgery      RIGHT SIDE  . Shoulder surgery      RIGHT  . Cholecystectomy    . Eye surgery      BIL CATARACT   . Orif wrist fracture  10/13/2011    Procedure: OPEN REDUCTION INTERNAL FIXATION (ORIF) WRIST FRACTURE;  Surgeon: [name], MD;  Location: [facility];  Service: Orthopedics;  Laterality: Right;  Open Reduction Internal Fixation Right Distal Radius   . Abdominal hysterectomy    . Btl    . Hardware removal  01/29/2012    Procedure: HARDWARE REMOVAL;  Surgeon: [name], MD;   Location: [facility];  Service: Orthopedics;  Laterality: Right;  . Hip arthroplasty  01/29/2012    Procedure: ARTHROPLASTY BIPOLAR HIP;  Surgeon: [name], MD;  Location: [facility];  Service: Orthopedics;  Laterality: Right;   Family History  Problem Relation Age of Onset  . Cancer - Lung Mother   . Coronary artery disease Mother   . Coronary artery disease Sister   . Coronary artery disease Brother    History  Substance Use Topics  . Smoking status: Former Smoker -- 1.00 packs/day for 50 years    Types: Cigarettes    Quit date: 06/27/2013  . Smokeless tobacco: Not on file  . Alcohol Use: No   OB History   Grav Para Term Preterm Abortions TAB SAB Ect Mult Living                 Review of Systems  Constitutional: Positive for activity change and fatigue. Negative for fever and appetite change.  HENT: Positive for nosebleeds. Negative for trouble swallowing.   Eyes: Negative for photophobia and visual disturbance.  Respiratory: Negative for cough and shortness of breath.   Cardiovascular: Negative for chest pain.  Gastrointestinal: Positive for abdominal pain and blood in stool. Negative for nausea and  vomiting.  Genitourinary: Positive for hematuria. Negative for dysuria, vaginal bleeding and vaginal discharge.  Musculoskeletal: Negative for arthralgias and myalgias.  Neurological: Positive for weakness. Negative for dizziness, light-headedness and headaches.  Psychiatric/Behavioral: Negative for confusion and agitation. The patient is not hyperactive.   A complete 10 system review of systems was obtained and all systems are negative except as noted in the HPI and PMH.      Allergies  Review of patient's allergies indicates no known allergies.  Home Medications   Prior to Admission medications   Medication Sig Start Date End Date Taking? Authorizing Provider  albuterol (PROVENTIL) (2.5 MG/3ML) 0.083% nebulizer solution Take 2.5 mg by nebulization every 6 (six) hours.   Yes  Historical Provider, MD  atorvastatin (LIPITOR) 40 MG tablet Take 40 mg by mouth daily.  07/02/13  Yes Historical Provider, MD  budesonide-formoterol (SYMBICORT) 160-4.5 MCG/ACT inhaler Inhale 2 puffs into the lungs 2 (two) times daily. 08/06/13  Yes Barton Dubois, MD  digoxin (LANOXIN) 0.25 MG tablet Take 0.25 mg by mouth daily.   Yes Historical Provider, MD  diltiazem (DILACOR XR) 240 MG 24 hr capsule Take 240 mg by mouth daily.  06/29/13  Yes Historical Provider, MD  docusate sodium (COLACE) 100 MG capsule Take 100 mg by mouth daily as needed (constipation).    Yes Historical Provider, MD  fluticasone (FLONASE) 50 MCG/ACT nasal spray Place 1 spray into both nostrils daily as needed for allergies or rhinitis. 08/06/13  Yes Barton Dubois, MD  furosemide (LASIX) 40 MG tablet Take 40 mg by mouth daily.   Yes Historical Provider, MD  iron polysaccharides (NIFEREX) 150 MG capsule Take 1 capsule (150 mg total) by mouth 2 (two) times daily. 08/06/13  Yes Barton Dubois, MD  loperamide (IMODIUM) 2 MG capsule Take 2 mg by mouth 2 (two) times daily as needed for diarrhea or loose stools.    Yes Historical Provider, MD  metoprolol succinate (TOPROL-XL) 50 MG 24 hr tablet Take 12.5 mg by mouth daily. Take with or immediately following a meal.   Yes Historical Provider, MD  Omega-3 Fatty Acids (FISH OIL) 1200 MG CAPS Take 1,200 mg by mouth daily.   Yes Historical Provider, MD  OVER THE COUNTER MEDICATION Take 1 tablet by mouth at bedtime. Hyland's Leg Cramps PM for sleep and leg cramps   Yes Historical Provider, MD  oxybutynin (DITROPAN) 5 MG tablet Take 10 mg by mouth at bedtime.  06/12/13  Yes Historical Provider, MD  pantoprazole (PROTONIX) 40 MG tablet Take 40 mg by mouth daily.  06/12/13  Yes Historical Provider, MD  rivaroxaban (XARELTO) 20 MG TABS tablet Take 1 tablet (20 mg total) by mouth daily with supper. Hold for 10 days and then resume medication as previously prescribed. 08/06/13  Yes Barton Dubois, MD   tiotropium (SPIRIVA) 18 MCG inhalation capsule Place 1 capsule (18 mcg total) into inhaler and inhale daily. 08/06/13  Yes Barton Dubois, MD  traMADol (ULTRAM) 50 MG tablet Take 50-100 mg by mouth 2 (two) times daily as needed (pain).    Yes Historical Provider, MD  vitamin B-12 (CYANOCOBALAMIN) 500 MCG tablet Take 500 mcg by mouth daily.   Yes Historical Provider, MD  cephALEXin (KEFLEX) 500 MG capsule Take 1 capsule (500 mg total) by mouth 4 (four) times daily. 10/14/13   Ezequiel Essex, MD   BP 170/52  Pulse 63  Temp(Src) 98.1 F (36.7 C) (Oral)  Resp 15  Ht 5\' 5"  (1.651 m)  Wt 118 lb (53.524  kg)  BMI 19.64 kg/m2  SpO2 100% Physical Exam  Nursing note and vitals reviewed. Constitutional: She is oriented to person, place, and time. She appears well-developed and well-nourished. No distress.  HENT:  Head: Normocephalic and atraumatic.  Mouth/Throat: Oropharynx is clear and moist. No oropharyngeal exudate.  Scabbed lesion to right nasal septum. No active bleeding. No blood oropharynx  Eyes: Conjunctivae and EOM are normal. Pupils are equal, round, and reactive to light.  Neck: Normal range of motion. Neck supple.  No meningismus.  Cardiovascular: Normal rate, regular rhythm, normal heart sounds and intact distal pulses.   No murmur heard. Pulmonary/Chest: Effort normal and breath sounds normal. No respiratory distress.  Abdominal: Soft. There is tenderness. There is no rebound and no guarding.  TTP Lower abdominal tenderness without guarding.  Genitourinary: Guaiac negative stool.  No stool in rectal vault. No gross blood  Musculoskeletal: Normal range of motion. She exhibits no edema and no tenderness.  Neurological: She is alert and oriented to person, place, and time. No cranial nerve deficit. She exhibits normal muscle tone. Coordination normal.  No ataxia on finger to nose bilaterally. No pronator drift. 5/5 strength throughout. CN 2-12 intact. Negative Romberg. Equal grip  strength. Sensation intact. Gait is normal.   Skin: Skin is warm.  Psychiatric: She has a normal mood and affect. Her behavior is normal.    ED Course  Procedures (including critical care time) Labs Review Labs Reviewed  CBC WITH DIFFERENTIAL - Abnormal; Notable for the following:    Hemoglobin 10.1 (*)    MCV 72.8 (*)    MCH 20.0 (*)    MCHC 27.5 (*)    RDW 20.5 (*)    Platelets 128 (*)    All other components within normal limits  COMPREHENSIVE METABOLIC PANEL - Abnormal; Notable for the following:    CO2 38 (*)    Albumin 3.3 (*)    Total Bilirubin <0.2 (*)    GFR calc non Af Amer 87 (*)    All other components within normal limits  URINALYSIS, ROUTINE W REFLEX MICROSCOPIC - Abnormal; Notable for the following:    APPearance CLOUDY (*)    pH 8.5 (*)    Hgb urine dipstick TRACE (*)    Nitrite POSITIVE (*)    Leukocytes, UA MODERATE (*)    All other components within normal limits  URINE MICROSCOPIC-ADD ON - Abnormal; Notable for the following:    Bacteria, UA MANY (*)    All other components within normal limits  URINE CULTURE  PROTIME-INR  TROPONIN I  POC OCCULT BLOOD, ED  POC OCCULT BLOOD, ED    Imaging Review Ct Abdomen Pelvis W Contrast  10/14/2013   CLINICAL DATA:  Rectal bleeding.  RIGHT lower abdominal pain.  EXAM: CT ABDOMEN AND PELVIS WITH CONTRAST  TECHNIQUE: Multidetector CT imaging of the abdomen and pelvis was performed using the standard protocol following bolus administration of intravenous contrast.  CONTRAST:  133mL OMNIPAQUE IOHEXOL 300 MG/ML  SOLN  COMPARISON:  05/06/2010.  FINDINGS: Bones: No aggressive osseous lesions. Large Schmorl's noted L4. Artifact from RIGHT hip hemiarthroplasty.  Lung Bases: Negative.  Liver: Intrahepatic biliary ductal dilation is present, chronic. No mass lesion.  Spleen:  Old granulomatous disease.  Gallbladder:  Cholecystectomy with clips in the fossa.  Common bile duct: Marked biliary ductal dilation is present. There  appears to be dilation of the cystic duct remnant as well. Common bile duct measures up to 16 mm. If bilirubin is normal, no  further evaluation is warranted. This recommendation follows ACR consensus guidelines: White Paper of the ACR Incidental Findings Committee II on Gallbladder and Biliary Findings. J Am Coll Radiol 2013:;10:953-956. No calcified common duct stones.  Pancreas: Dilated pancreatic duct. Metallic density is present near the and pole, presumably postprocedural or postsurgical. This is unchanged compared to prior.  Adrenal glands: Stable LEFT adrenal nodule, consistent with adenoma.  Kidneys: Enhancement and excretion of contrast is within normal limits. No hydronephrosis. The ureters appear within normal limits.  Stomach:  No inflammatory changes of stomach.  No obstruction.  Small bowel: No small bowel obstruction or mesenteric adenopathy. No inflammatory changes.  Colon: Appendix not identified. No RIGHT lower quadrant inflammatory changes. Large stool burden is present. Respiratory motion degrades evaluation of the abdomen.  Pelvic Genitourinary: Hysterectomy. Distended urinary bladder. No free fluid.  Vasculature: 26 mm infrarenal abdominal aorta. Severe atherosclerosis. No acute vascular abnormality.  Body Wall: Within normal limits.  IMPRESSION: 1. No acute abnormality. 2. Cholecystectomy with postcholecystectomy dilation of the biliary system. Correlation with bilirubin recommended. If normal, no further evaluation is warranted. 3. Ectatic abdominal aorta at risk for aneurysm development. Recommend follow up by [place] in 5 years. This recommendation follows ACR consensus guidelines: White Paper of the ACR Incidental Findings Committee II on Vascular Findings. J Am Coll Radiol 2013; 10:789-794. 4. Hysterectomy.   Electronically Signed   By: Dereck Ligas M.D.   On: 10/14/2013 17:52     EKG Interpretation   Date/Time:  Sunday October 14 2013 15:26:38 EDT Ventricular Rate:  63 PR Interval:   155 QRS Duration: 88 QT Interval:  392 QTC Calculation: 401 R Axis:   73 Text Interpretation:  Sinus rhythm Probable LVH with secondary repol abnrm  No significant change was found Confirmed by Wyvonnia Dusky  MD, Meryn Sarracino [phone])  on 10/14/2013 3:38:18 PM      MDM   Final diagnoses:  Epistaxis  Urinary tract infection without hematuria, site unspecified   Intermittent nosebleeds for the past one week with dark stools and lower abdominal pain. Vital stable. No distress. Notably controlled on arrival  Hemoglobin stable 10.1.  FOBT negative. He describes dark stools. Her Hemoccult here is negative. She does take iron supplements. Orthostatics negative.  Urinalysis is positive for infection. Unlike her admission in May, urine is clear and not hemorrhagic. We'll treat with Rocephin. Culture sent.  Bilirubin is normal. CT scan shows no acute pathology. There is a ectatic abdominal aorta that is at risk for aneurysm development. She will need a ultrasound 5 years.  Patient's vitals are stable. X-ray negative. Her hemoglobin is stable. She's anxious to go home. Patient informed of need for abdominal aorta ultrasound 5 years.  Instructed to return to ED if she develops BRBPR, nose bleed that continues after 30 minutes of pressure, chest pain, SOB, abdominal pain or any other concerns.  BP 170/52  Pulse 63  Temp(Src) 98.1 F (36.7 C) (Oral)  Resp 15  Ht 5\' 5"  (1.651 m)  Wt 118 lb (53.524 kg)  BMI 19.64 kg/m2  SpO2 100%   Ezequiel Essex, MD 10/15/13 0155

## 2013-10-14 NOTE — ED Notes (Signed)
Pt finished contrast

## 2013-10-14 NOTE — Discharge Instructions (Signed)
Urinary Tract Infection Take the antibiotics as prescribed. If your nose bleed recurs, hold pressure for 20 minutes and return to the ED if bleeding continues after that. Return to the ED if you develop new or worsening symptoms. Urinary tract infections (UTIs) can develop anywhere along your urinary tract. Your urinary tract is your body's drainage system for removing wastes and extra water. Your urinary tract includes two kidneys, two ureters, a bladder, and a urethra. Your kidneys are a pair of bean-shaped organs. Each kidney is about the size of your fist. They are located below your ribs, one on each side of your spine. CAUSES Infections are caused by microbes, which are microscopic organisms, including fungi, viruses, and bacteria. These organisms are so small that they can only be seen through a microscope. Bacteria are the microbes that most commonly cause UTIs. SYMPTOMS  Symptoms of UTIs may vary by age and gender of the patient and by the location of the infection. Symptoms in young women typically include a frequent and intense urge to urinate and a painful, burning feeling in the bladder or urethra during urination. Older women and men are more likely to be tired, shaky, and weak and have muscle aches and abdominal pain. A fever may mean the infection is in your kidneys. Other symptoms of a kidney infection include pain in your back or sides below the ribs, nausea, and vomiting. DIAGNOSIS To diagnose a UTI, your caregiver will ask you about your symptoms. Your caregiver also will ask to provide a urine sample. The urine sample will be tested for bacteria and white blood cells. White blood cells are made by your body to help fight infection. TREATMENT  Typically, UTIs can be treated with medication. Because most UTIs are caused by a bacterial infection, they usually can be treated with the use of antibiotics. The choice of antibiotic and length of treatment depend on your symptoms and the type  of bacteria causing your infection. HOME CARE INSTRUCTIONS  If you were prescribed antibiotics, take them exactly as your caregiver instructs you. Finish the medication even if you feel better after you have only taken some of the medication.  Drink enough water and fluids to keep your urine clear or pale yellow.  Avoid caffeine, tea, and carbonated beverages. They tend to irritate your bladder.  Empty your bladder often. Avoid holding urine for long periods of time.  Empty your bladder before and after sexual intercourse.  After a bowel movement, women should cleanse from front to back. Use each tissue only once. SEEK MEDICAL CARE IF:   You have back pain.  You develop a fever.  Your symptoms do not begin to resolve within 3 days. SEEK IMMEDIATE MEDICAL CARE IF:   You have severe back pain or lower abdominal pain.  You develop chills.  You have nausea or vomiting.  You have continued burning or discomfort with urination. MAKE SURE YOU:   Understand these instructions.  Will watch your condition.  Will get help right away if you are not doing well or get worse. Document Released: 12/09/2004 Document Revised: 08/31/2011 Document Reviewed: 04/09/2011 [facility] Patient Information 2015 [place], [place]. This information is not intended to replace advice given to you by your health care provider. Make sure you discuss any questions you have with your health care provider.   Nosebleed Nosebleeds can be caused by many conditions, including trauma, infections, polyps, foreign bodies, dry mucous membranes or climate, medicines, and air conditioning. Most nosebleeds occur in the front  of the nose. Because of this location, most nosebleeds can be controlled by pinching the nostrils gently and continuously for at least 10 to 20 minutes. The long, continuous pressure allows enough time for the blood to clot. If pressure is released during that 10 to 20 minute time period, the process may  have to be started again. The nosebleed may stop by itself or quit with pressure, or it may need concentrated heating (cautery) or pressure from packing. HOME CARE INSTRUCTIONS   If your nose was packed, try to maintain the pack inside until your health care provider removes it. If a gauze pack was used and it starts to fall out, gently replace it or cut the end off. Do not cut if a balloon catheter was used to pack the nose. Otherwise, do not remove unless instructed.  Avoid blowing your nose for 12 hours after treatment. This could dislodge the pack or clot and start the bleeding again.  If the bleeding starts again, sit up and bend forward, gently pinching the front half of your nose continuously for 20 minutes.  If bleeding was caused by dry mucous membranes, use over-the-counter saline nasal spray or gel. This will keep the mucous membranes moist and allow them to heal. If you must use a lubricant, choose the water-soluble variety. Use it only sparingly and not within several hours of lying down.  Do not use petroleum jelly or mineral oil, as these may drip into the lungs and cause serious problems.  Maintain humidity in your home by using less air conditioning or by using a humidifier.  Do not use aspirin or medicines which make bleeding more likely. Your health care provider can give you recommendations on this.  Resume normal activities as you are able, but try to avoid straining, lifting, or bending at the waist for several days.  If the nosebleeds become recurrent and the cause is unknown, your health care provider may suggest laboratory tests. SEEK MEDICAL CARE IF: You have a fever. SEEK IMMEDIATE MEDICAL CARE IF:   Bleeding recurs and cannot be controlled.  There is unusual bleeding from or bruising on other parts of the body.  Nosebleeds continue.  There is any worsening of the condition which originally brought you in.  You become light-headed, feel faint, become sweaty,  or vomit blood. MAKE SURE YOU:   Understand these instructions.  Will watch your condition.  Will get help right away if you are not doing well or get worse. Document Released: 12/09/2004 Document Revised: 07/16/2013 Document Reviewed: 01/30/2009 [facility] Patient Information 2015 [place], [place]. This information is not intended to replace advice given to you by your health care provider. Make sure you discuss any questions you have with your health care provider.

## 2013-10-14 NOTE — ED Notes (Signed)
Patient transported to CT 

## 2013-10-14 NOTE — ED Notes (Signed)
Pt sts she has had nose bleeds off and on for 2 weeks. Pt sts she is on Xarelto. Pt also sts she has been having blacky tarry stools and weakness for 2 weeks. Pt sts she has in LLQ and RLQ. Nose bleed controlled. Per nose bleed was controlled on arrival. EMS Vitals 156/60, HR 62, O2 97% 2L, Temp 98.2.

## 2013-10-16 LAB — URINE CULTURE

## 2013-10-18 ENCOUNTER — Telehealth (HOSPITAL_BASED_OUTPATIENT_CLINIC_OR_DEPARTMENT_OTHER): Payer: Self-pay | Admitting: Emergency Medicine

## 2013-10-18 NOTE — Telephone Encounter (Signed)
Post ED Visit - Positive Culture Follow-up  Culture report reviewed by antimicrobial stewardship pharmacist: []  Wes Jacumba, Pharm.D., BCPS []  Heide Guile, Pharm.D., BCPS [x]  Alycia Rossetti, Pharm.D., BCPS []  Harrold, Pharm.D., BCPS, AAHIVP []  Legrand Como, Pharm.D., BCPS, AAHIVP []  Hassie Bruce, Pharm.D. []  Milus Glazier, [place].D.  Positive urine culture Treated with Keflex, organism sensitive to the same and no further patient follow-up is required at this time.  Myrna Blazer 10/18/2013, 6:23 PM

## 2013-12-28 ENCOUNTER — Emergency Department (HOSPITAL_COMMUNITY): Payer: Medicare HMO

## 2013-12-28 ENCOUNTER — Telehealth: Payer: Self-pay | Admitting: Surgery

## 2013-12-28 ENCOUNTER — Encounter (HOSPITAL_COMMUNITY): Payer: Self-pay | Admitting: Emergency Medicine

## 2013-12-28 ENCOUNTER — Emergency Department (HOSPITAL_COMMUNITY)
Admission: EM | Admit: 2013-12-28 | Discharge: 2013-12-28 | Disposition: A | Discharge status: Home / Self Care | Payer: Medicare HMO | Attending: Emergency Medicine | Admitting: Emergency Medicine

## 2013-12-28 DIAGNOSIS — J441 Chronic obstructive pulmonary disease with (acute) exacerbation: Secondary | ICD-10-CM | POA: Insufficient documentation

## 2013-12-28 DIAGNOSIS — Z86718 Personal history of other venous thrombosis and embolism: Secondary | ICD-10-CM | POA: Diagnosis not present

## 2013-12-28 DIAGNOSIS — Z7951 Long term (current) use of inhaled steroids: Secondary | ICD-10-CM | POA: Insufficient documentation

## 2013-12-28 DIAGNOSIS — I129 Hypertensive chronic kidney disease with stage 1 through stage 4 chronic kidney disease, or unspecified chronic kidney disease: Secondary | ICD-10-CM | POA: Insufficient documentation

## 2013-12-28 DIAGNOSIS — I252 Old myocardial infarction: Secondary | ICD-10-CM | POA: Insufficient documentation

## 2013-12-28 DIAGNOSIS — R609 Edema, unspecified: Secondary | ICD-10-CM | POA: Diagnosis not present

## 2013-12-28 DIAGNOSIS — R0602 Shortness of breath: Secondary | ICD-10-CM | POA: Diagnosis present

## 2013-12-28 DIAGNOSIS — N189 Chronic kidney disease, unspecified: Secondary | ICD-10-CM | POA: Insufficient documentation

## 2013-12-28 DIAGNOSIS — Z87891 Personal history of nicotine dependence: Secondary | ICD-10-CM | POA: Diagnosis not present

## 2013-12-28 DIAGNOSIS — M1990 Unspecified osteoarthritis, unspecified site: Secondary | ICD-10-CM | POA: Diagnosis not present

## 2013-12-28 DIAGNOSIS — G43909 Migraine, unspecified, not intractable, without status migrainosus: Secondary | ICD-10-CM | POA: Diagnosis not present

## 2013-12-28 DIAGNOSIS — Z7901 Long term (current) use of anticoagulants: Secondary | ICD-10-CM | POA: Diagnosis not present

## 2013-12-28 DIAGNOSIS — K219 Gastro-esophageal reflux disease without esophagitis: Secondary | ICD-10-CM | POA: Diagnosis not present

## 2013-12-28 DIAGNOSIS — Z87440 Personal history of urinary (tract) infections: Secondary | ICD-10-CM | POA: Insufficient documentation

## 2013-12-28 DIAGNOSIS — Z8701 Personal history of pneumonia (recurrent): Secondary | ICD-10-CM | POA: Insufficient documentation

## 2013-12-28 DIAGNOSIS — Z79899 Other long term (current) drug therapy: Secondary | ICD-10-CM | POA: Diagnosis not present

## 2013-12-28 DIAGNOSIS — Z862 Personal history of diseases of the blood and blood-forming organs and certain disorders involving the immune mechanism: Secondary | ICD-10-CM | POA: Insufficient documentation

## 2013-12-28 LAB — I-STAT CHEM 8, ED
BUN: 11 mg/dL (ref 6–23)
CHLORIDE: 94 meq/L — AB (ref 96–112)
Calcium, Ion: 1.06 mmol/L — ABNORMAL LOW (ref 1.13–1.30)
Creatinine, Ser: 0.7 mg/dL (ref 0.50–1.10)
GLUCOSE: 90 mg/dL (ref 70–99)
HCT: 39 % (ref 36.0–46.0)
Hemoglobin: 13.3 g/dL (ref 12.0–15.0)
POTASSIUM: 3.6 meq/L — AB (ref 3.7–5.3)
SODIUM: 142 meq/L (ref 137–147)
TCO2: 39 mmol/L (ref 0–100)

## 2013-12-28 LAB — CBC WITH DIFFERENTIAL/PLATELET
Basophils Absolute: 0 10*3/uL (ref 0.0–0.1)
Basophils Relative: 0 % (ref 0–1)
EOS ABS: 0 10*3/uL (ref 0.0–0.7)
EOS PCT: 0 % (ref 0–5)
HEMATOCRIT: 36.5 % (ref 36.0–46.0)
HEMOGLOBIN: 10.4 g/dL — AB (ref 12.0–15.0)
LYMPHS PCT: 16 % (ref 12–46)
Lymphs Abs: 1.2 10*3/uL (ref 0.7–4.0)
MCH: 20.2 pg — ABNORMAL LOW (ref 26.0–34.0)
MCHC: 28.5 g/dL — ABNORMAL LOW (ref 30.0–36.0)
MCV: 71 fL — ABNORMAL LOW (ref 78.0–100.0)
Monocytes Absolute: 0.8 10*3/uL (ref 0.1–1.0)
Monocytes Relative: 11 % (ref 3–12)
NEUTROS PCT: 73 % (ref 43–77)
Neutro Abs: 5.2 10*3/uL (ref 1.7–7.7)
Platelets: 104 10*3/uL — ABNORMAL LOW (ref 150–400)
RBC: 5.14 MIL/uL — AB (ref 3.87–5.11)
RDW: 20 % — ABNORMAL HIGH (ref 11.5–15.5)
WBC: 7.2 10*3/uL (ref 4.0–10.5)

## 2013-12-28 LAB — I-STAT TROPONIN, ED: Troponin i, poc: 0 ng/mL (ref 0.00–0.08)

## 2013-12-28 MED ORDER — IPRATROPIUM-ALBUTEROL 0.5-2.5 (3) MG/3ML IN SOLN
3.0000 mL | Freq: Once | RESPIRATORY_TRACT | Status: AC
  Administered 2013-12-28: 3 mL via RESPIRATORY_TRACT
  Filled 2013-12-28: qty 3

## 2013-12-28 MED ORDER — PREDNISONE 20 MG PO TABS
60.0000 mg | ORAL_TABLET | Freq: Once | ORAL | Status: AC
  Administered 2013-12-28: 60 mg via ORAL
  Filled 2013-12-28: qty 3

## 2013-12-28 NOTE — Telephone Encounter (Signed)
Received call regarding discharge prescription. EDP stated there were no discharge prescriptions. Patient is to f/u with PCP

## 2013-12-28 NOTE — Discharge Instructions (Signed)

## 2013-12-28 NOTE — ED Provider Notes (Signed)
CSN: [id number]     Arrival date & time 12/28/13  7341 History   First MD Initiated Contact with Patient 12/28/13 [phone]     Chief Complaint  Patient presents with  . Shortness of Breath     (Consider location/radiation/quality/duration/timing/severity/associated sxs/prior Treatment) HPI Comments: Patient history of COPD presents with shortness of breath. She is on chronic oxygen at home at 2-3 L per minute. She states that for the last 2 to 3 days she's had worsening shortness of breath and wheezing. She denies any chest pain or tightness. She has a cough which is productive of clear sputum. She denies any fevers or chills. She denies any nausea or vomiting. She denies any abnormal leg swelling. She reports that her symptoms are consistent with her past episodes of COPD exacerbations. She's been using her inhalers and nebulizer machine at home without improvement of symptoms.  Patient is a 73 y.o. female presenting with shortness of breath.  Shortness of Breath Associated symptoms: cough and wheezing   Associated symptoms: no abdominal pain, no chest pain, no diaphoresis, no fever, no headaches, no rash and no vomiting     Past Medical History  Diagnosis Date  . Hypertension   . Myocardial infarction   . Shortness of breath   . Asthma   . COPD (chronic obstructive pulmonary disease)     emphysema    . Pneumonia     hx pneumonia ,chronic bronchitis   . H/O blood clots   . Chronic kidney disease     current   UTI   being treated  . GERD (gastroesophageal reflux disease)   . Headache(784.0)     hx migraines  . Blood dyscrasia     hx blood clotts  . Arthritis    Past Surgical History  Procedure Laterality Date  . Right leg    . Hip fracture surgery      RIGHT SIDE  . Shoulder surgery      RIGHT  . Cholecystectomy    . Eye surgery      BIL CATARACT   . Orif wrist fracture  10/13/2011    Procedure: OPEN REDUCTION INTERNAL FIXATION (ORIF) WRIST FRACTURE;  Surgeon: [name], MD;  Location: [facility];  Service: Orthopedics;  Laterality: Right;  Open Reduction Internal Fixation Right Distal Radius   . Abdominal hysterectomy    . Btl    . Hardware removal  01/29/2012    Procedure: HARDWARE REMOVAL;  Surgeon: [name], MD;  Location: [facility];  Service: Orthopedics;  Laterality: Right;  . Hip arthroplasty  01/29/2012    Procedure: ARTHROPLASTY BIPOLAR HIP;  Surgeon: [name], MD;  Location: [facility];  Service: Orthopedics;  Laterality: Right;   Family History  Problem Relation Age of Onset  . Cancer - Lung Mother   . Coronary artery disease Mother   . Coronary artery disease Sister   . Coronary artery disease Brother    History  Substance Use Topics  . Smoking status: Former Smoker -- 1.00 packs/day for 50 years    Types: Cigarettes    Quit date: 06/27/2013  . Smokeless tobacco: Not on file  . Alcohol Use: No   OB History   Grav Para Term Preterm Abortions TAB SAB Ect Mult Living                 Review of Systems  Constitutional: Positive for fatigue. Negative for fever, chills and diaphoresis.  HENT: Negative for congestion, rhinorrhea  and sneezing.   Eyes: Negative.   Respiratory: Positive for cough, shortness of breath and wheezing. Negative for chest tightness.   Cardiovascular: Negative for chest pain and leg swelling.  Gastrointestinal: Negative for nausea, vomiting, abdominal pain, diarrhea and blood in stool.  Genitourinary: Negative for frequency, hematuria, flank pain and difficulty urinating.  Musculoskeletal: Negative for arthralgias and back pain.  Skin: Negative for rash.  Neurological: Negative for dizziness, speech difficulty, weakness, numbness and headaches.      Allergies  Review of patient's allergies indicates no known allergies.  Home Medications   Prior to Admission medications   Medication Sig Start Date End Date Taking? Authorizing Provider  albuterol (PROVENTIL) (2.5 MG/3ML) 0.083% nebulizer solution Take 2.5  mg by nebulization every 6 (six) hours.   Yes Historical Provider, MD  atorvastatin (LIPITOR) 40 MG tablet Take 40 mg by mouth daily.  07/02/13  Yes Historical Provider, MD  budesonide-formoterol (SYMBICORT) 160-4.5 MCG/ACT inhaler Inhale 2 puffs into the lungs 2 (two) times daily. 08/06/13  Yes Barton Dubois, MD  digoxin (LANOXIN) 0.25 MG tablet Take 0.125-0.25 mg by mouth daily.    Yes Historical Provider, MD  diltiazem (DILACOR XR) 240 MG 24 hr capsule Take 240 mg by mouth daily.  06/29/13  Yes Historical Provider, MD  docusate sodium (COLACE) 100 MG capsule Take 100 mg by mouth daily as needed (constipation).    Yes Historical Provider, MD  fluticasone (FLONASE) 50 MCG/ACT nasal spray Place 1 spray into both nostrils daily as needed for allergies or rhinitis. 08/06/13  Yes Barton Dubois, MD  furosemide (LASIX) 40 MG tablet Take 40 mg by mouth daily.   Yes Historical Provider, MD  iron polysaccharides (NIFEREX) 150 MG capsule Take 1 capsule (150 mg total) by mouth 2 (two) times daily. 08/06/13  Yes Barton Dubois, MD  loperamide (IMODIUM) 2 MG capsule Take 2 mg by mouth 2 (two) times daily as needed for diarrhea or loose stools.    Yes Historical Provider, MD  metoprolol succinate (TOPROL-XL) 50 MG 24 hr tablet Take 12.5 mg by mouth daily. Take with or immediately following a meal.   Yes Historical Provider, MD  Omega-3 Fatty Acids (FISH OIL) 1200 MG CAPS Take 1,200 mg by mouth daily.   Yes Historical Provider, MD  OVER THE COUNTER MEDICATION Take 1 tablet by mouth at bedtime. Hyland's Leg Cramps PM for sleep and leg cramps   Yes Historical Provider, MD  oxybutynin (DITROPAN-XL) 5 MG 24 hr tablet Take 10 mg by mouth at bedtime.   Yes Historical Provider, MD  pantoprazole (PROTONIX) 40 MG tablet Take 40 mg by mouth daily.  06/12/13  Yes Historical Provider, MD  rivaroxaban (XARELTO) 20 MG TABS tablet Take 20 mg by mouth daily with supper.   Yes Historical Provider, MD  tiotropium (SPIRIVA) 18 MCG  inhalation capsule Place 1 capsule (18 mcg total) into inhaler and inhale daily. 08/06/13  Yes Barton Dubois, MD  traMADol (ULTRAM) 50 MG tablet Take 50-100 mg by mouth 2 (two) times daily as needed (pain).    Yes Historical Provider, MD  vitamin B-12 (CYANOCOBALAMIN) 500 MCG tablet Take 1,000 mcg by mouth daily.    Yes Historical Provider, MD   BP 139/45  Pulse 53  Temp(Src) 98.1 F (36.7 C) (Oral)  Resp 16  Ht 5\' 5"  (1.651 m)  Wt 118 lb (53.524 kg)  BMI 19.64 kg/m2  SpO2 96% Physical Exam  Constitutional: She is oriented to person, place, and time. She appears well-developed and  well-nourished.  Patient is talking in full sentences with no increased work of breathing  HENT:  Head: Normocephalic and atraumatic.  Eyes: Pupils are equal, round, and reactive to light.  Neck: Normal range of motion. Neck supple.  Cardiovascular: Normal rate, regular rhythm and normal heart sounds.   Pulmonary/Chest: Effort normal. No respiratory distress. She has wheezes. She has no rales. She exhibits no tenderness.  Positive rhonchi bilaterally with some mild expiratory wheezing. No accessory muscle use  Abdominal: Soft. Bowel sounds are normal. There is no tenderness. There is no rebound and no guarding.  Musculoskeletal: Normal range of motion. She exhibits edema.  No calf tenderness, trace nonpitting edema bilaterally  Lymphadenopathy:    She has no cervical adenopathy.  Neurological: She is alert and oriented to person, place, and time.  Skin: Skin is warm and dry. No rash noted.  Psychiatric: She has a normal mood and affect.    ED Course  Procedures (including critical care time) Labs Review Results for orders placed during the hospital encounter of 12/28/13  CBC WITH DIFFERENTIAL      Result Value Ref Range   WBC 7.2  4.0 - 10.5 K/uL   RBC 5.14 (*) 3.87 - 5.11 MIL/uL   Hemoglobin 10.4 (*) 12.0 - 15.0 g/dL   HCT 36.5  36.0 - 46.0 %   MCV 71.0 (*) 78.0 - 100.0 fL   MCH 20.2 (*) 26.0 -  34.0 pg   MCHC 28.5 (*) 30.0 - 36.0 g/dL   RDW 20.0 (*) 11.5 - 15.5 %   Platelets 104 (*) 150 - 400 K/uL   Neutrophils Relative % 73  43 - 77 %   Lymphocytes Relative 16  12 - 46 %   Monocytes Relative 11  3 - 12 %   Eosinophils Relative 0  0 - 5 %   Basophils Relative 0  0 - 1 %   Neutro Abs 5.2  1.7 - 7.7 K/uL   Lymphs Abs 1.2  0.7 - 4.0 K/uL   Monocytes Absolute 0.8  0.1 - 1.0 K/uL   Eosinophils Absolute 0.0  0.0 - 0.7 K/uL   Basophils Absolute 0.0  0.0 - 0.1 K/uL   WBC Morphology ATYPICAL LYMPHOCYTES    I-STAT CHEM 8, ED      Result Value Ref Range   Sodium 142  137 - 147 mEq/L   Potassium 3.6 (*) 3.7 - 5.3 mEq/L   Chloride 94 (*) 96 - 112 mEq/L   BUN 11  6 - 23 mg/dL   Creatinine, Ser 0.70  0.50 - 1.10 mg/dL   Glucose, Bld 90  70 - 99 mg/dL   Calcium, Ion 1.06 (*) 1.13 - 1.30 mmol/L   TCO2 39  0 - 100 mmol/L   Hemoglobin 13.3  12.0 - 15.0 g/dL   HCT 39.0  36.0 - 46.0 %  I-STAT TROPOININ, ED      Result Value Ref Range   Troponin i, poc 0.00  0.00 - 0.08 ng/mL   Comment 3            Dg Chest 2 View  12/28/2013   CLINICAL DATA:  Pneumonia.  Hypertension.  EXAM: CHEST  2 VIEW  COMPARISON:  Chest x-ray 09/26/2013, 01/31/2012. CT chest 01/31/2012.  FINDINGS: Mediastinum and hilar structures are normal. Heart size is stable. Pulmonary vascularity normal. Stable biapical pleural parenchymal thickening noted consistent with scarring. Stable left base pleural thickening consistent with pleural scarring. No acute infiltrate. No acute bony abnormality.  Old healed right proximal humeral fracture.  IMPRESSION: Bilateral pleural parenchymal scarring.  No acute abnormality.   Electronically Signed   By: Marcello Moores  Register   On: 12/28/2013 12:46      Imaging Review Dg Chest 2 View  12/28/2013   CLINICAL DATA:  Pneumonia.  Hypertension.  EXAM: CHEST  2 VIEW  COMPARISON:  Chest x-ray 09/26/2013, 01/31/2012. CT chest 01/31/2012.  FINDINGS: Mediastinum and hilar structures are normal. Heart  size is stable. Pulmonary vascularity normal. Stable biapical pleural parenchymal thickening noted consistent with scarring. Stable left base pleural thickening consistent with pleural scarring. No acute infiltrate. No acute bony abnormality. Old healed right proximal humeral fracture.  IMPRESSION: Bilateral pleural parenchymal scarring.  No acute abnormality.   Electronically Signed   By: Marcello Moores  Register   On: 12/28/2013 12:46     EKG Interpretation   Date/Time:  Friday December 28 2013 10:15:50 EDT Ventricular Rate:  59 PR Interval:  161 QRS Duration: 89 QT Interval:  374 QTC Calculation: 370 R Axis:   79 Text Interpretation:  Sinus rhythm Probable left atrial enlargement  Probable left ventricular hypertrophy Nonspecific T abnormalities,  inferior leads since last tracing no significant change Confirmed by Fawne Hughley   MD, Rola Lennon ([id number]) on 12/28/2013 10:21:43 AM      MDM   Final diagnoses:  COPD exacerbation    Pt presents with SOB, cough, consistent with her prior COPD exacerbations.  No fever, no change in sputum color.  No pneumonia on CXR.  Hgb low, but at baseline value.  Pt got one neb in the ED and prednisone 60mg .  Pt is on Xarelto and states that she has a hx of GI bleeding in the past, so did not prescribe usual 5 day course of prednisone.  Pt is comfortable, no current SOB, no suggestions of PE.  Will d/c with f/u with her PCP.    [name], MD 12/28/13 [phone]

## 2013-12-28 NOTE — ED Notes (Signed)
Patient c/o sob. Onset 2 weeks ago states her MD told her she has COPD unsure what that is , states she is on home o2 at 2 liters, ems states she  Has a very long tubing. C/o cough and chest congestion with nasal congestion as well. C/o swelling in her ankles in the afternoon states she takes a :"water pill" everyday. Patient is able to speak in complete sentences and carry on a long conversation.

## 2014-02-22 IMAGING — CR DG ANKLE COMPLETE 3+V*R*
3 series · 3 of 3 positions shown · non-contrast
Comparison: None.

CLINICAL DATA: Fall and scratches to the ankle.

RIGHT ANKLE - COMPLETE 3+ VIEW

[t ankle joint ap right]
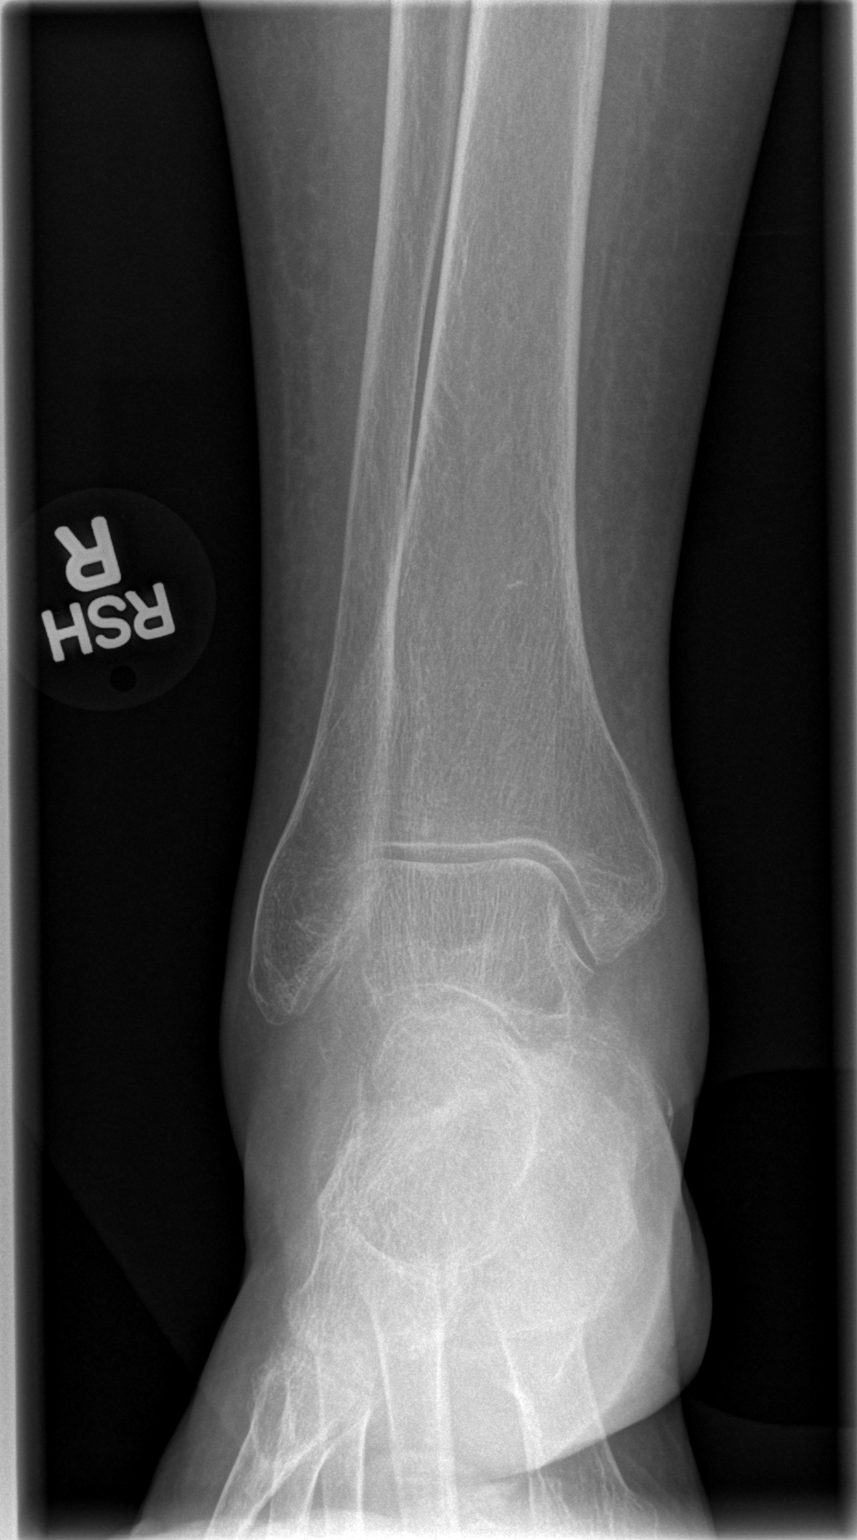

[t ankle joint oblique right]
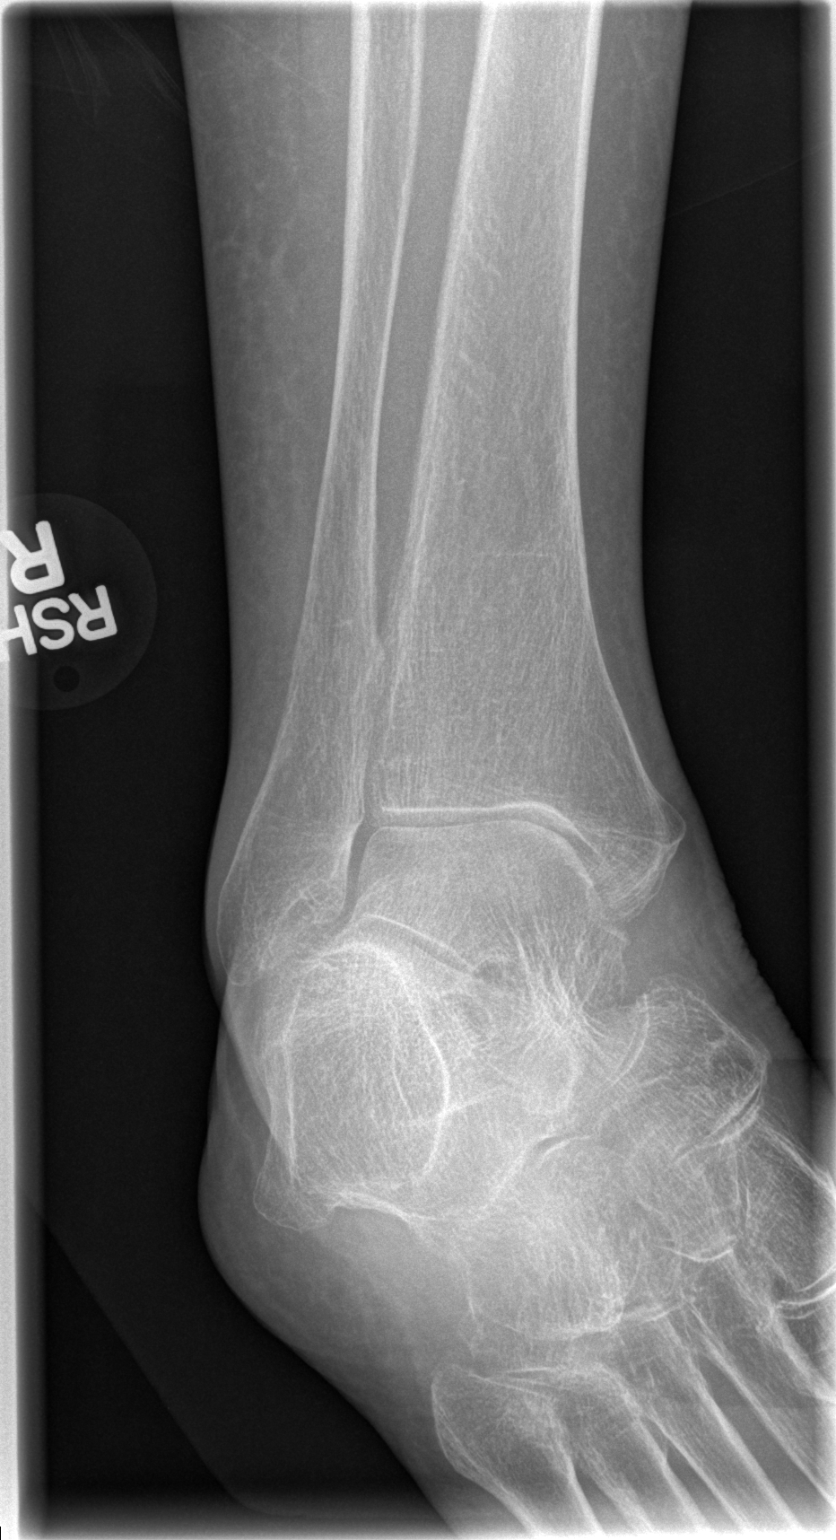

[t ankle joint lat right]
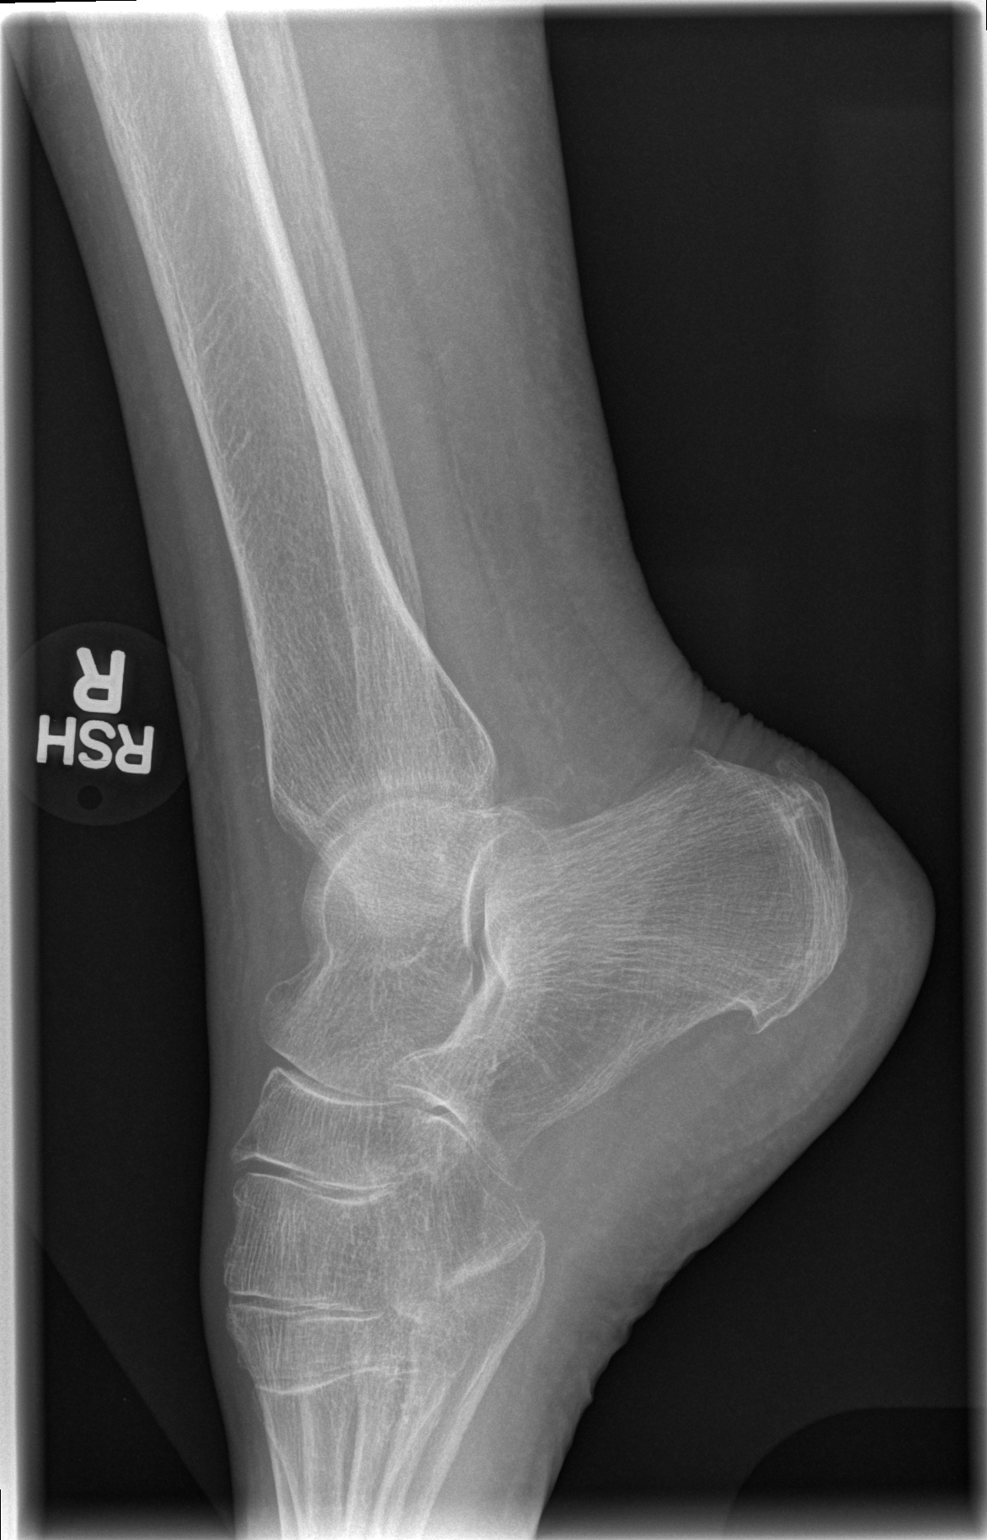

[3 of 3 positions shown; findings below may reference images not displayed]

FINDINGS: Three views of the right ankle were obtained.  There is
diffuse osteopenia.  Negative for acute fracture or dislocation.
There are calcaneal spurs.  Difficult to exclude mild soft tissue
swelling.
IMPRESSION: Diffuse osteopenia without acute bony abnormality.

## 2014-02-22 IMAGING — CR DG RIBS W/ CHEST 3+V*R*
3 series · 3 of 3 positions shown · non-contrast
Comparison: Chest radiograph 05/06/2010

CLINICAL DATA: Fall and right lower rib pain.

RIGHT RIBS AND CHEST - 3+ VIEW

[t chest supine]
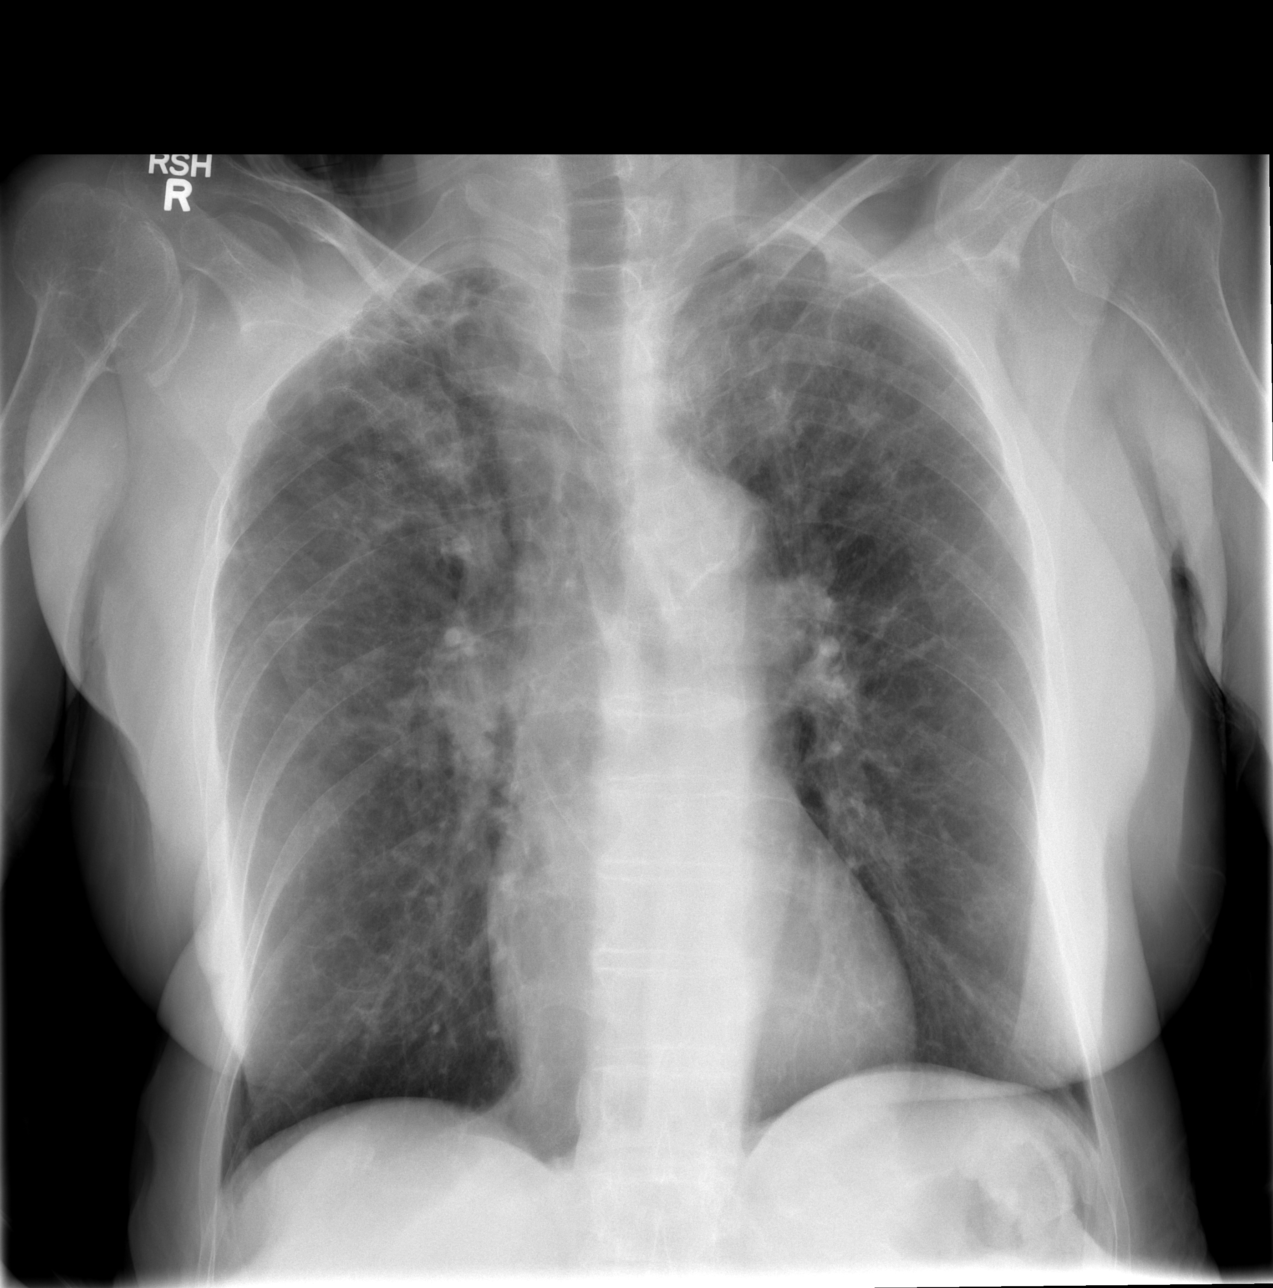

[t ribs ap/pa upper right]
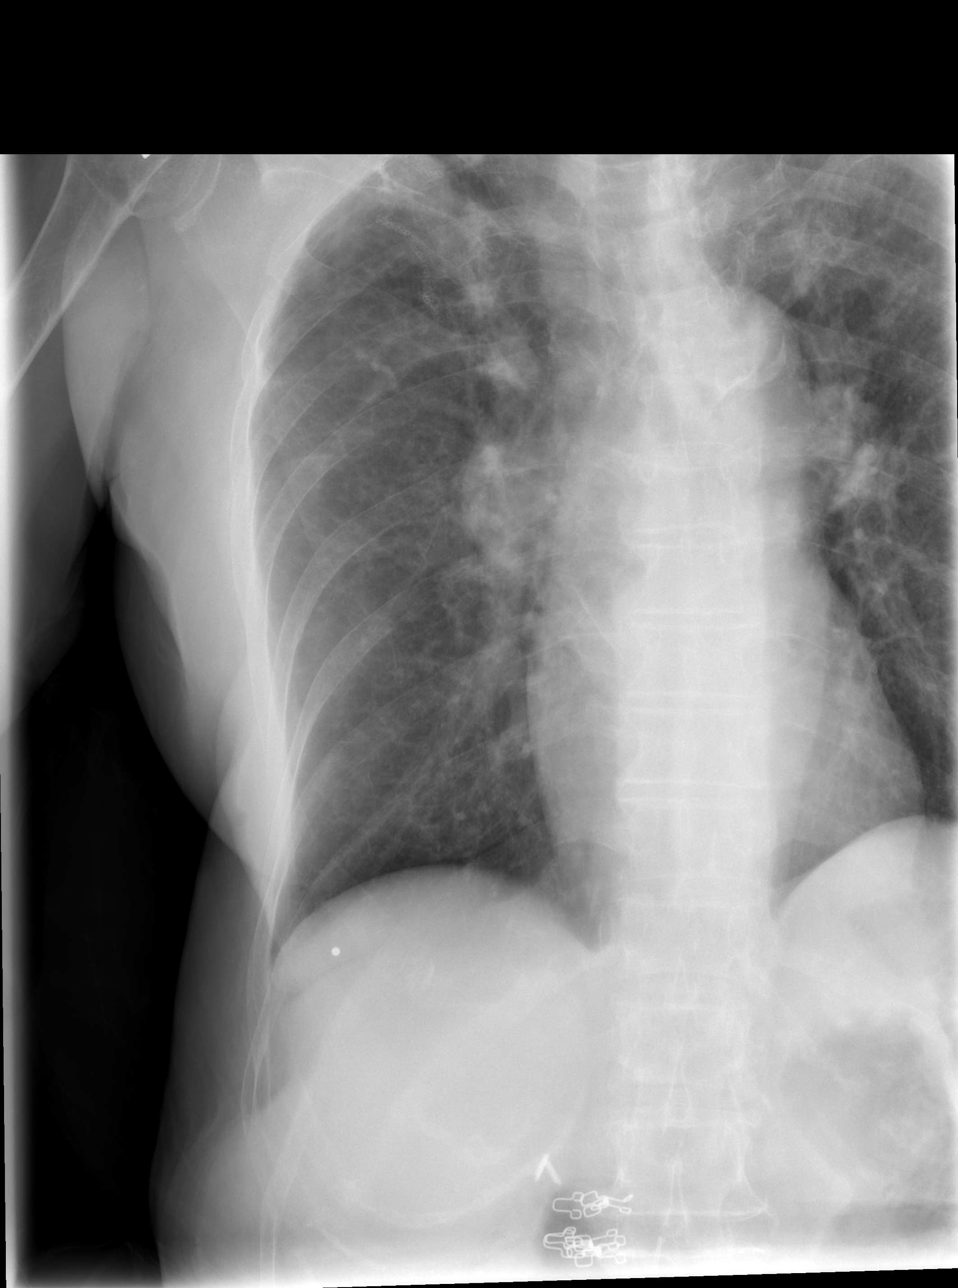

[t ribs obl. right]
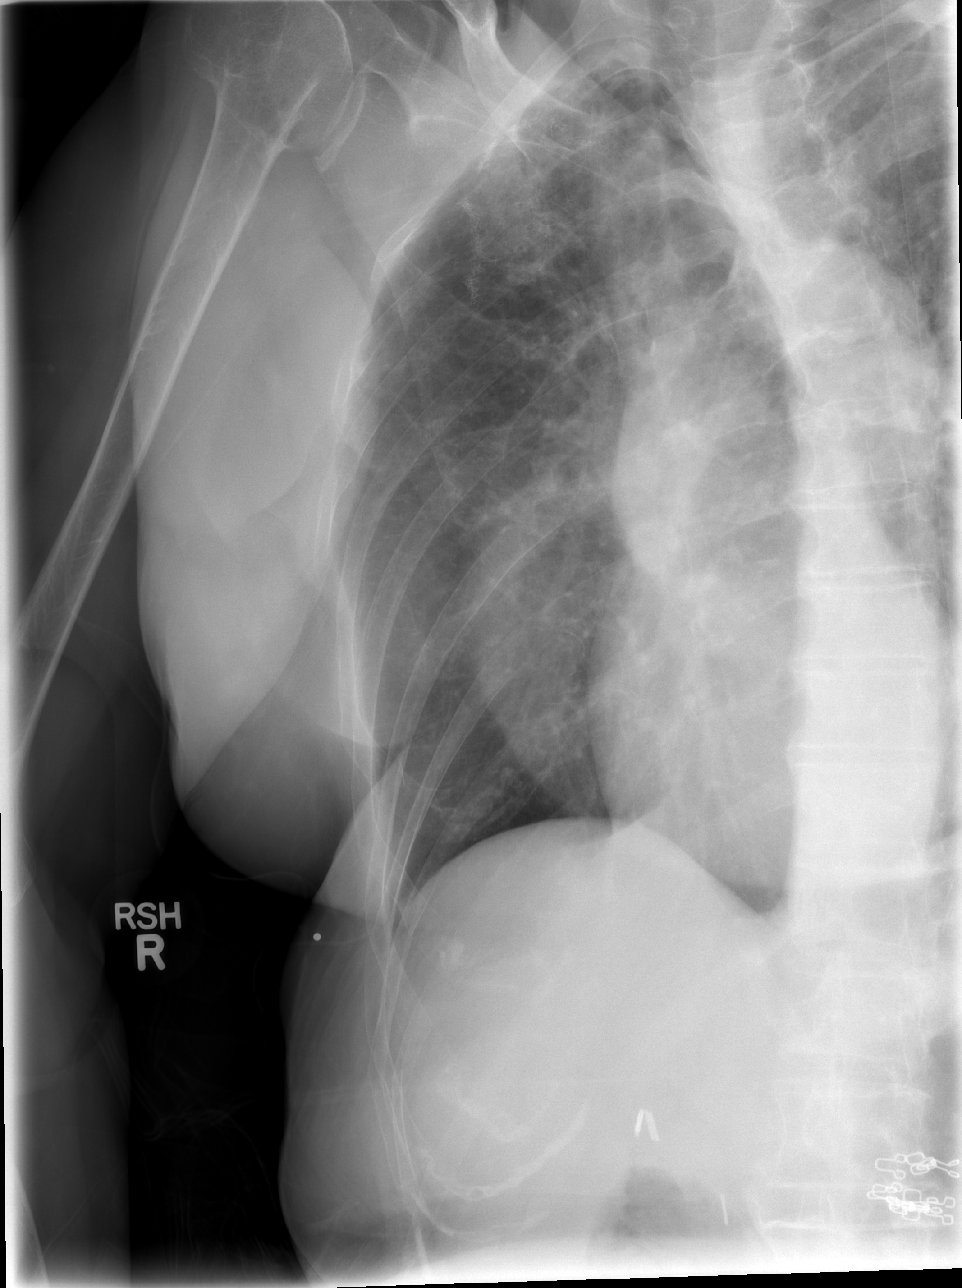

[3 of 3 positions shown; findings below may reference images not displayed]

FINDINGS: Chest radiograph demonstrates chronic changes in the
upper lungs, right side greater than left.  However, there are
increased vague densities in the upper lungs and cannot exclude
areas of nodularity.   Postsurgical changes in the right upper
lung.  Heart and mediastinum are stable.  Trachea is midline.  No
evidence for pneumothorax.  There may be an old right fourth rib
fracture.  There is concern for a nondisplaced fracture involving
the right seventh rib. Evidence for an old proximal right humeral
fracture.
IMPRESSION: Question a nondisplaced fracture involving the right seventh rib.

Old right fourth rib fracture and old right proximal humeral
fracture.

Increased vague densities in the upper lungs, right side greater
than left.  Some of these findings are related to chronic changes
and postsurgical changes.  However, new areas of nodularity cannot
be excluded.  Recommend close follow-up with PA and lateral chest
views or a chest CT for further evaluation.

## 2014-02-22 IMAGING — CR DG HAND COMPLETE 3+V*R*
4 series · 4 of 4 positions shown · non-contrast
Comparison: Right wrist 10/12/2011.

CLINICAL DATA: Fall with pain.

RIGHT HAND - COMPLETE 3+ VIEW

[x hand pa right]
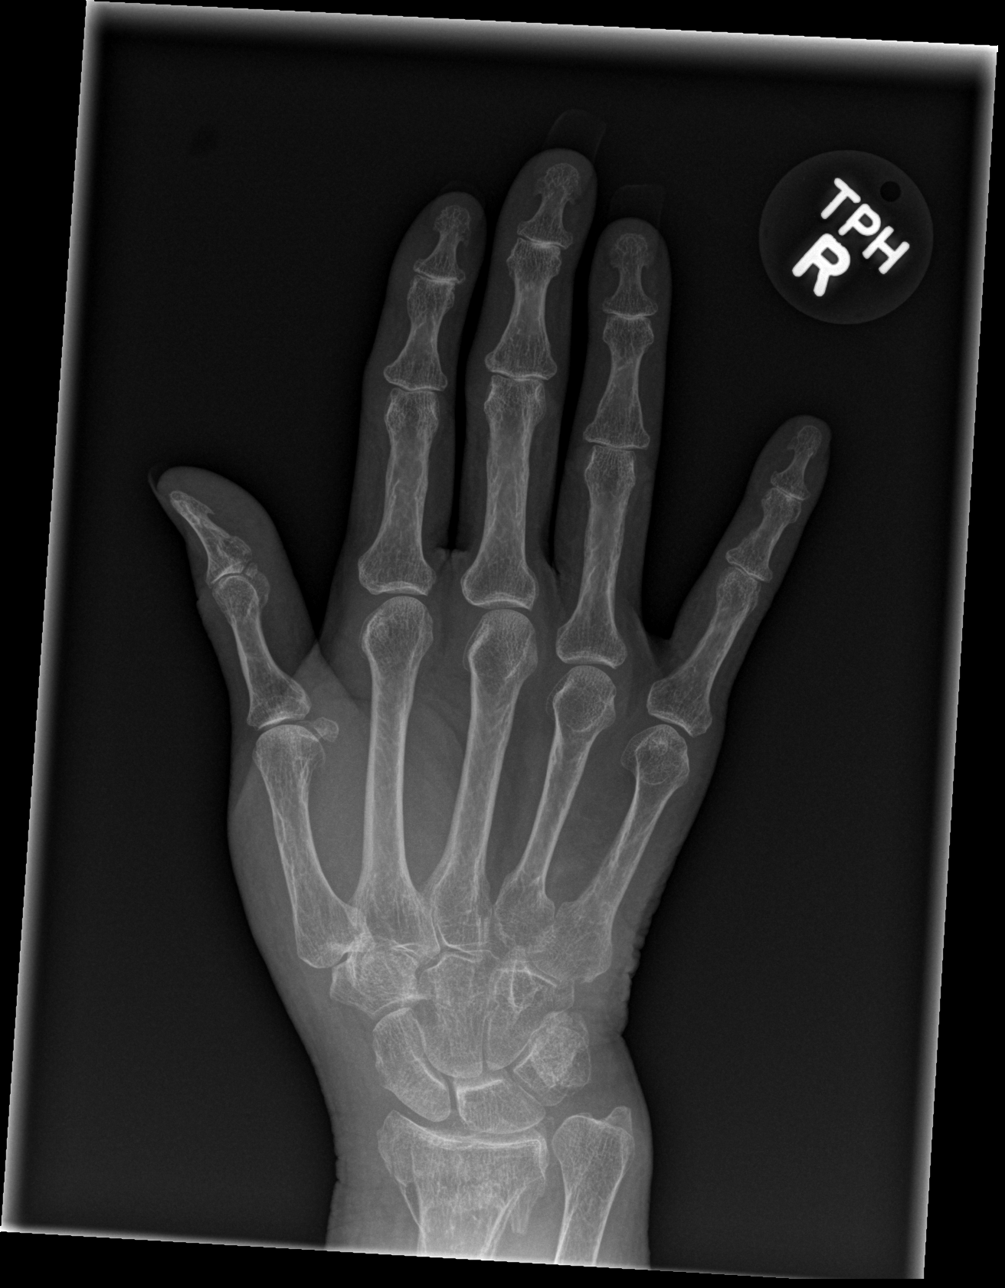

[x hand obl right]
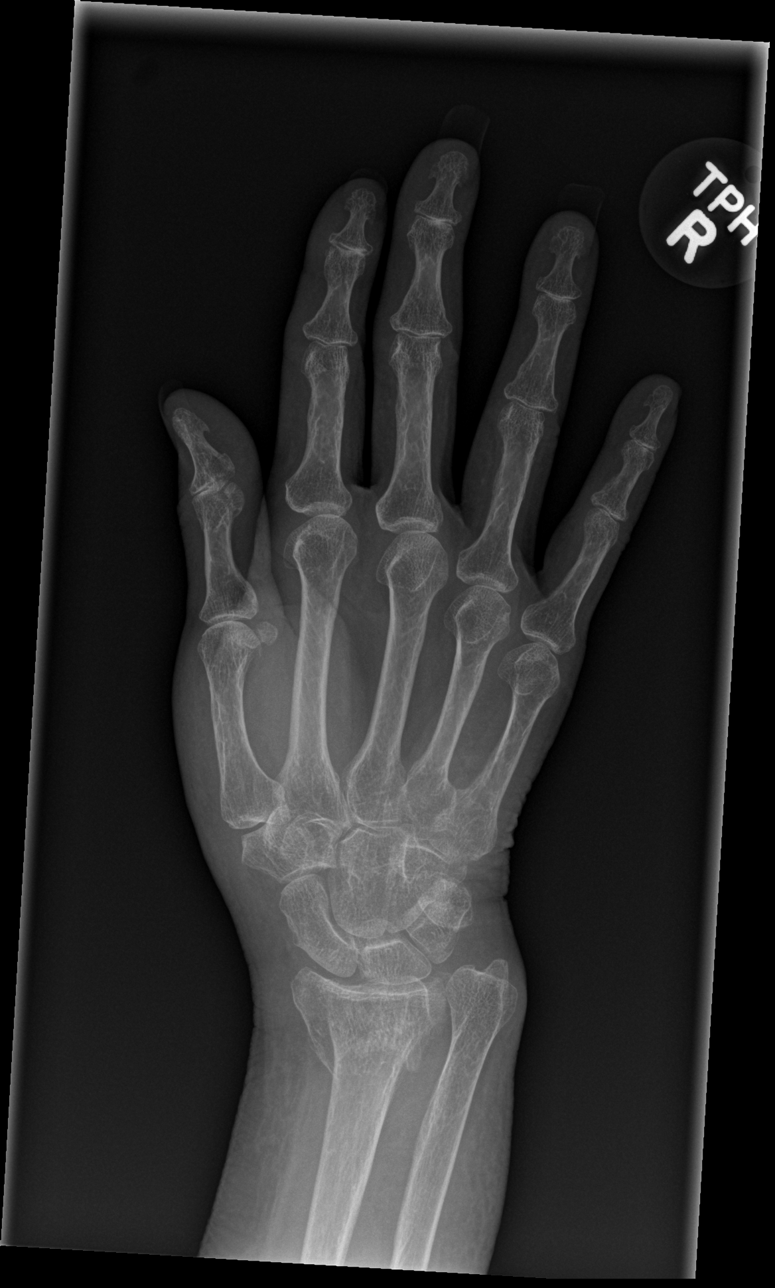

[x hand lat right (1 of 2)]
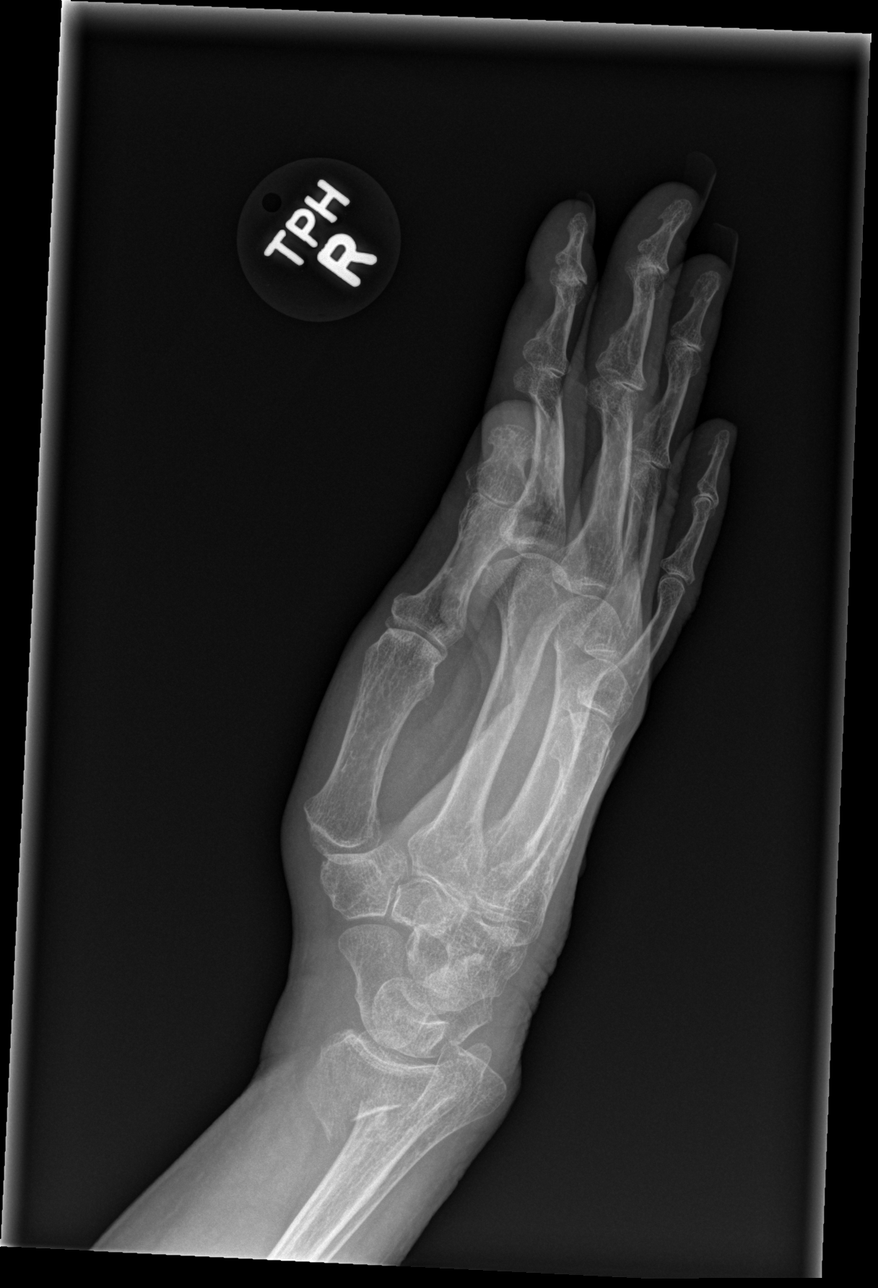

[x hand lat right (2 of 2)]
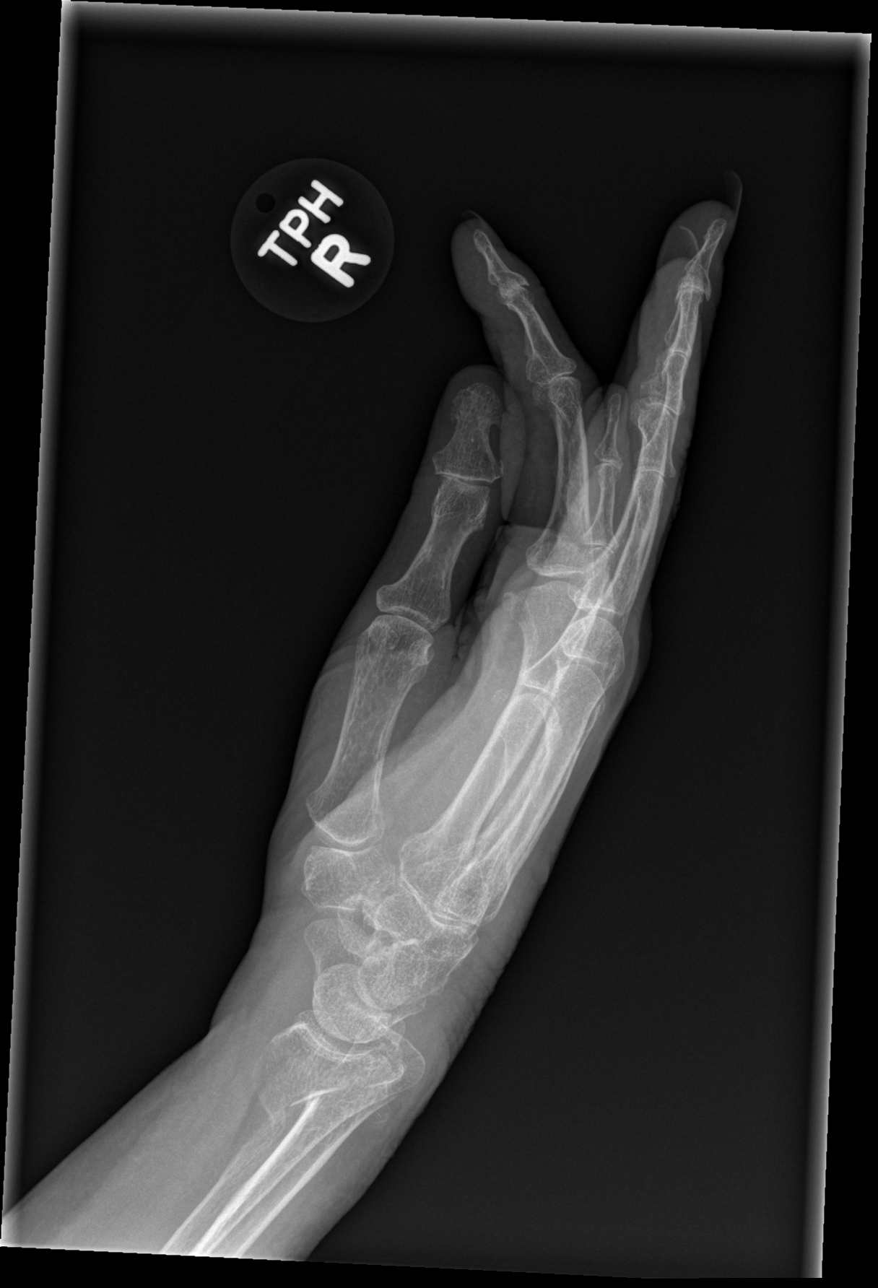

[4 of 4 positions shown; findings below may reference images not displayed]

FINDINGS: Osteopenia.  Impacted fracture of the distal radius is
incidentally imaged.  No evidence of an additional acute fracture
in the hand.
IMPRESSION: 1.  Impacted distal radius fracture.
2.  Osteopenia.

## 2014-02-22 IMAGING — CT CT HEAD W/O CM
1 series · 16 of 30 positions shown, 20 images · non-contrast
Comparison: CT head without contrast 04/05/2007 at [REDACTED]

CLINICAL DATA: Fall.  Right supraorbital abrasions and contusions.
No loss of consciousness.

CT HEAD WITHOUT CONTRAST
TECHNIQUE: Contiguous axial images were obtained from the base of
the skull through the vertex without contrast.

[Series 2: head trauma 4.8 h37s · axial · 0.44mm/px · z∈[-106,+25]mm · 16 of 30 slices shown, 20 images]
[im 2/30  brain]
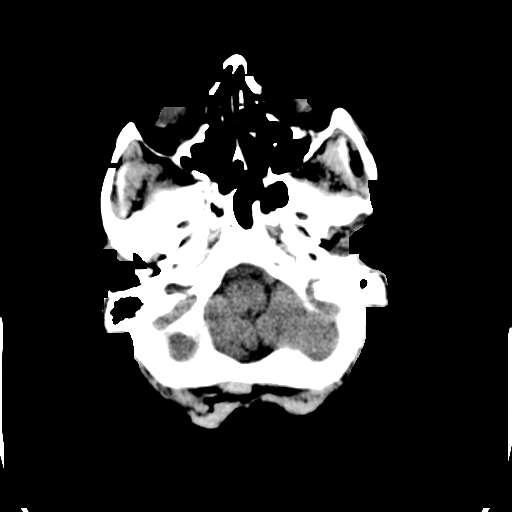
[im 2/30  bone]
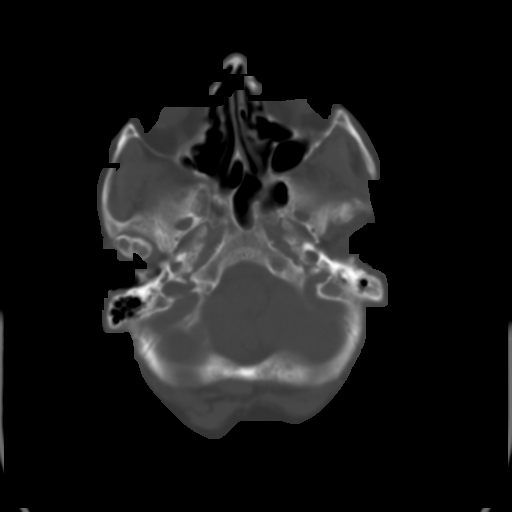
[im 4/30  brain]
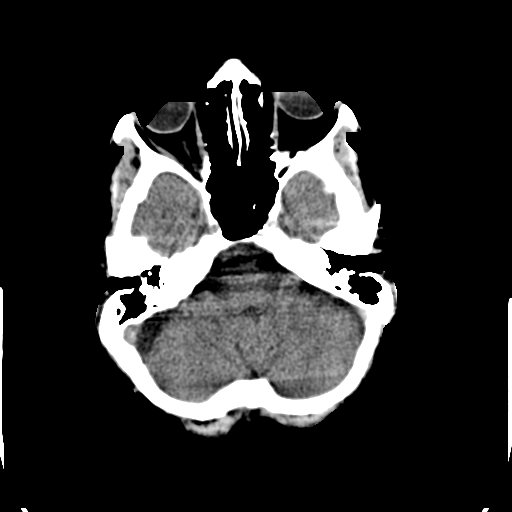
[im 6/30  brain]
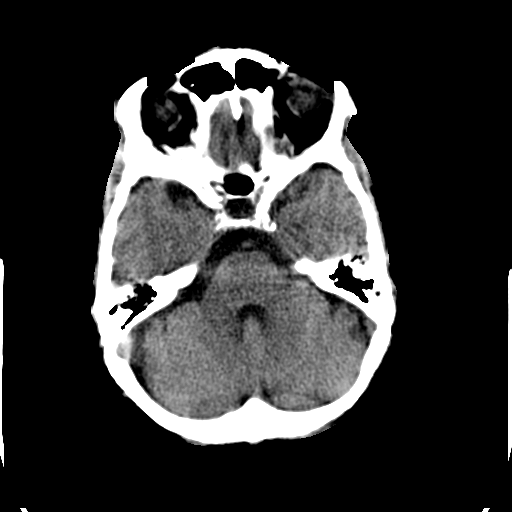
[im 8/30  brain]
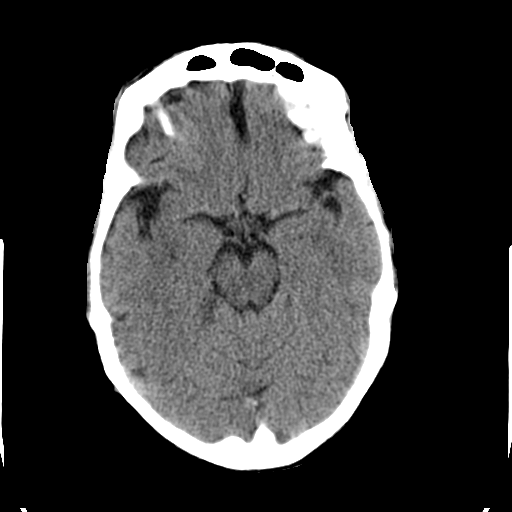
[im 9/30  brain]
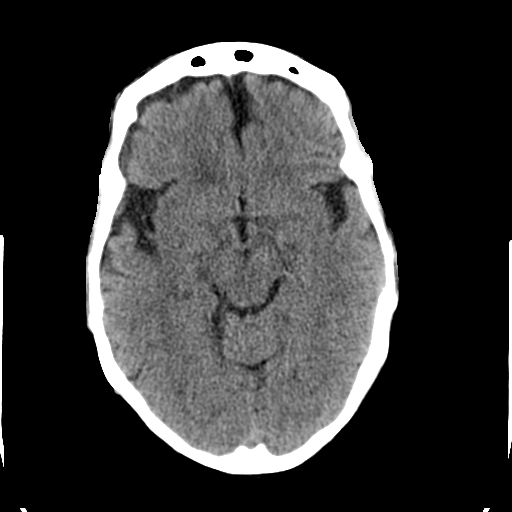
[im 9/30  bone]
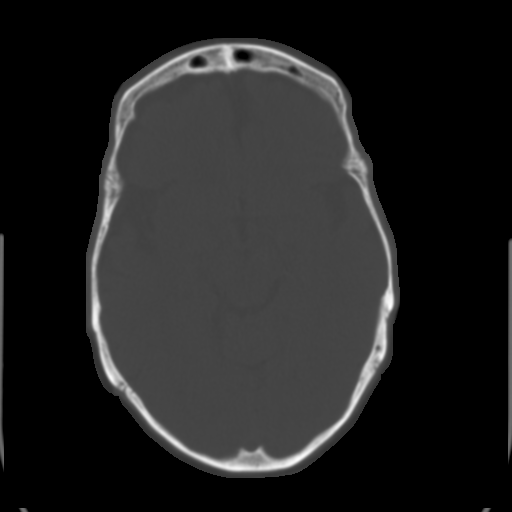
[im 11/30  brain]
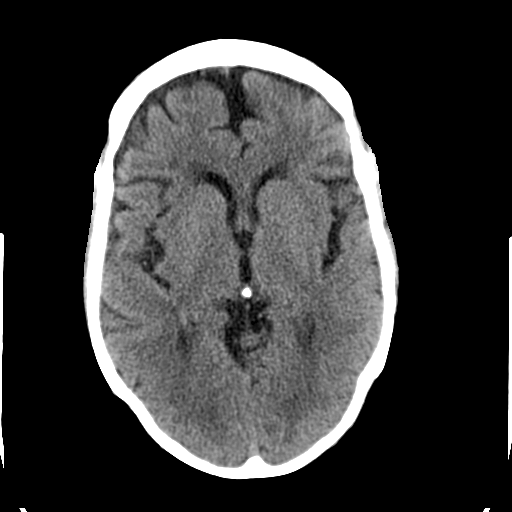
[im 13/30  brain]
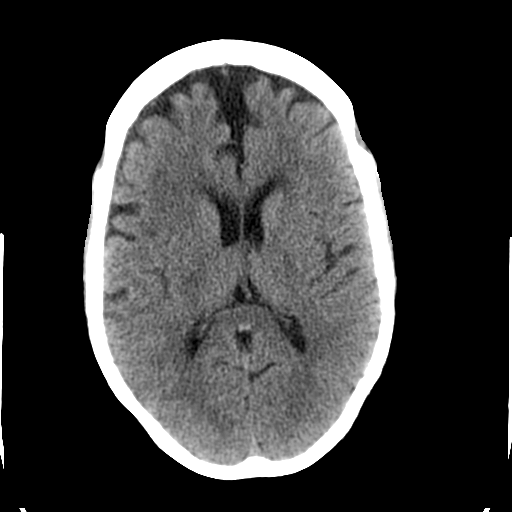
[im 15/30  brain]
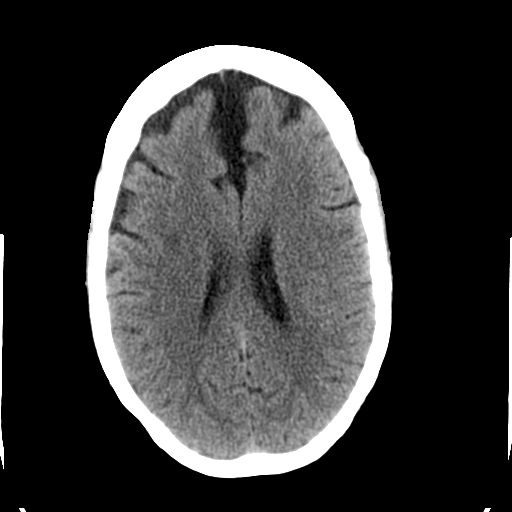
[im 16/30  brain]
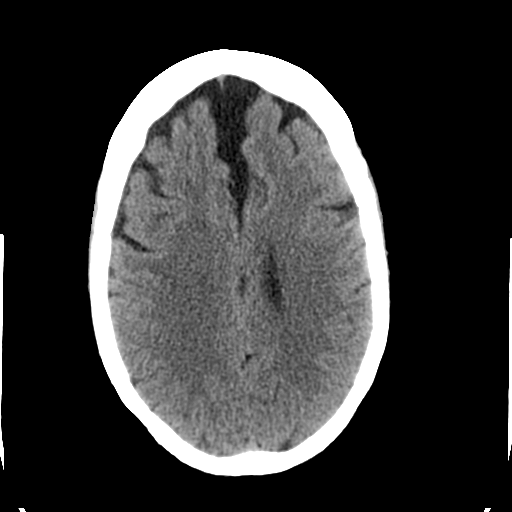
[im 16/30  bone]
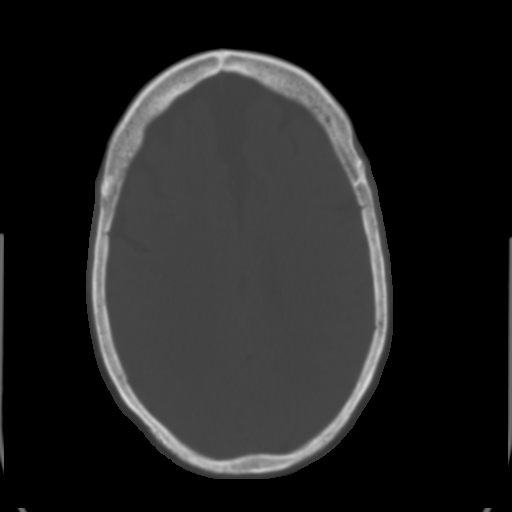
[im 18/30  brain]
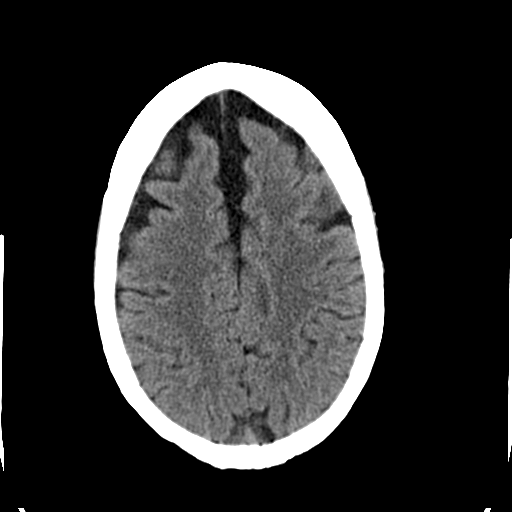
[im 20/30  brain]
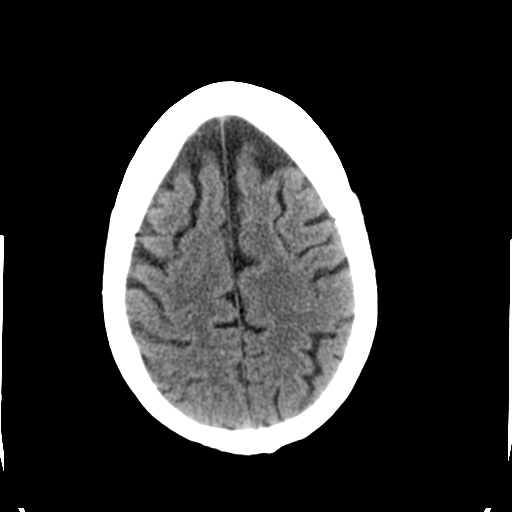
[im 22/30  brain]
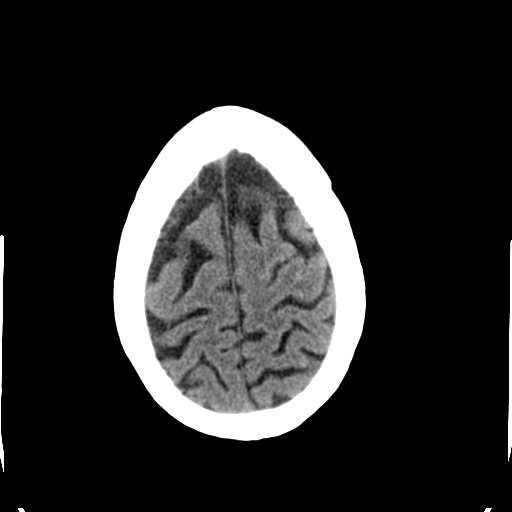
[im 23/30  brain]
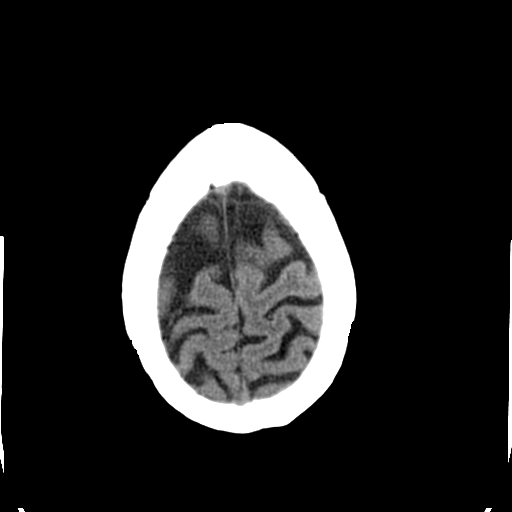
[im 23/30  bone]
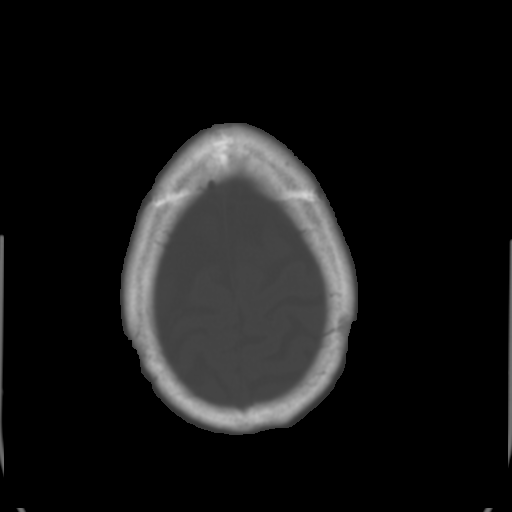
[im 25/30  brain]
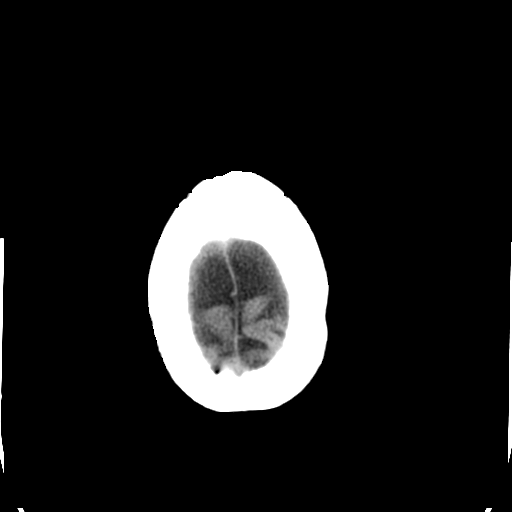
[im 27/30  brain]
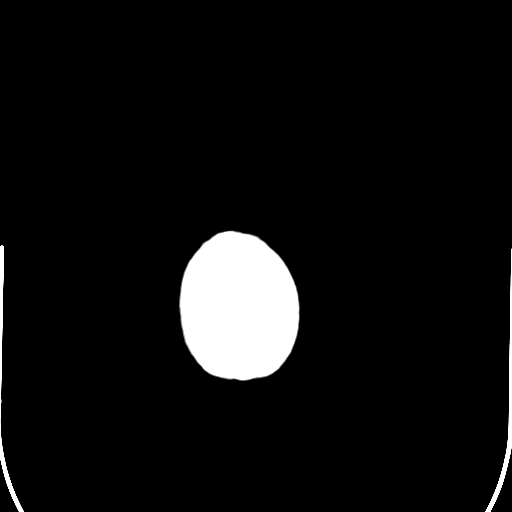
[im 29/30  brain]
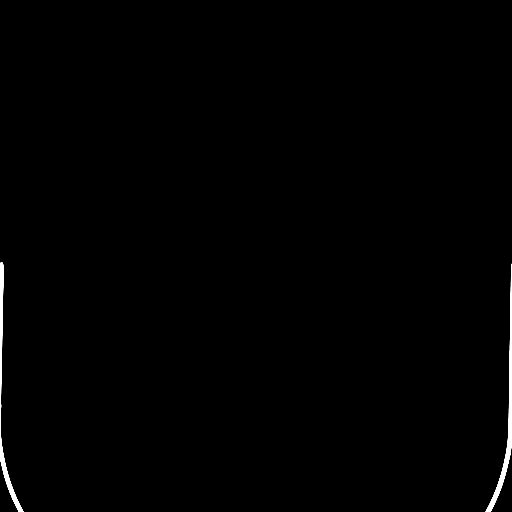

[16 of 30 positions shown; findings below may reference images not displayed]

FINDINGS: Mild generalized atrophy is present.  No acute infarct,
hemorrhage, or mass lesion is present.  The ventricles are normal
size.  Mild periventricular white matter hypoattenuation is similar
to the prior study.  There is scattered subcortical disease as
well.

The visualized paranasal sinuses and mastoid air cells are clear.
The osseous skull is intact.

No significant extra cranial soft tissue injury is evident.  No
skull fracture is identified.
IMPRESSION: 1.  No acute intracranial abnormality or significant interval
change.
2.  Stable atrophy and white matter disease.

## 2014-02-22 IMAGING — CR DG WRIST COMPLETE 3+V*R*
4 series · 4 of 4 positions shown · non-contrast
Comparison: None.

CLINICAL DATA: Fall and wrist pain.

RIGHT WRIST - COMPLETE 3+ VIEW

[x wrist pa right]
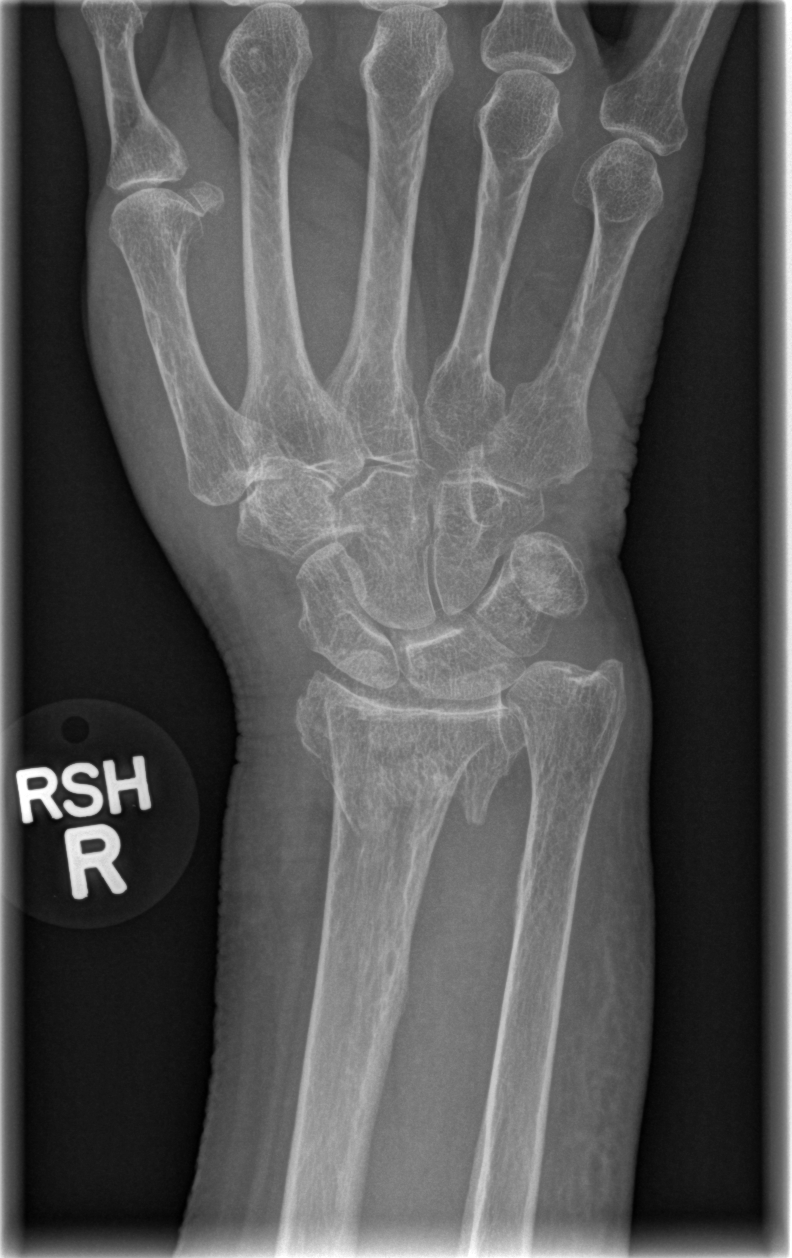

[x wrist obl right]
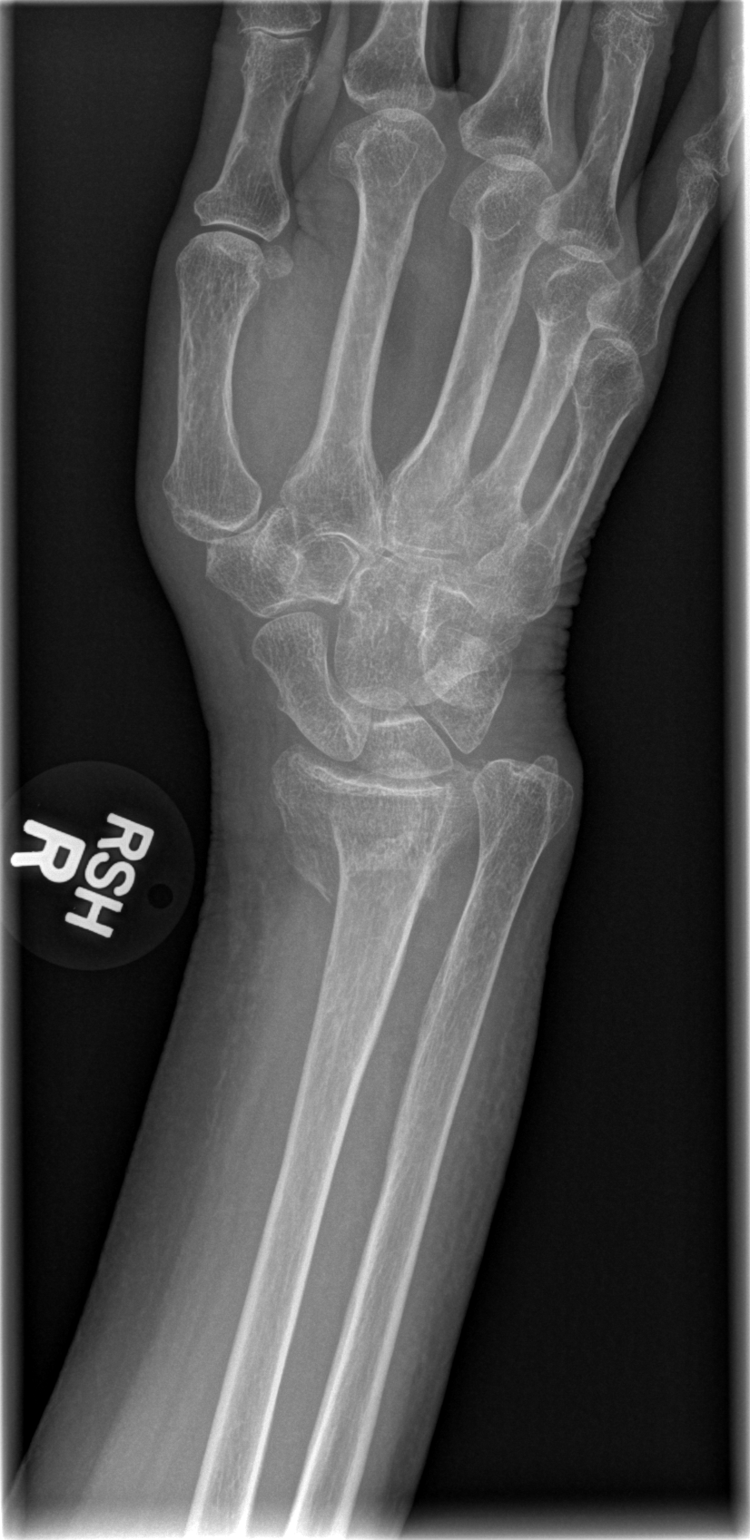

[x wrist lat right]
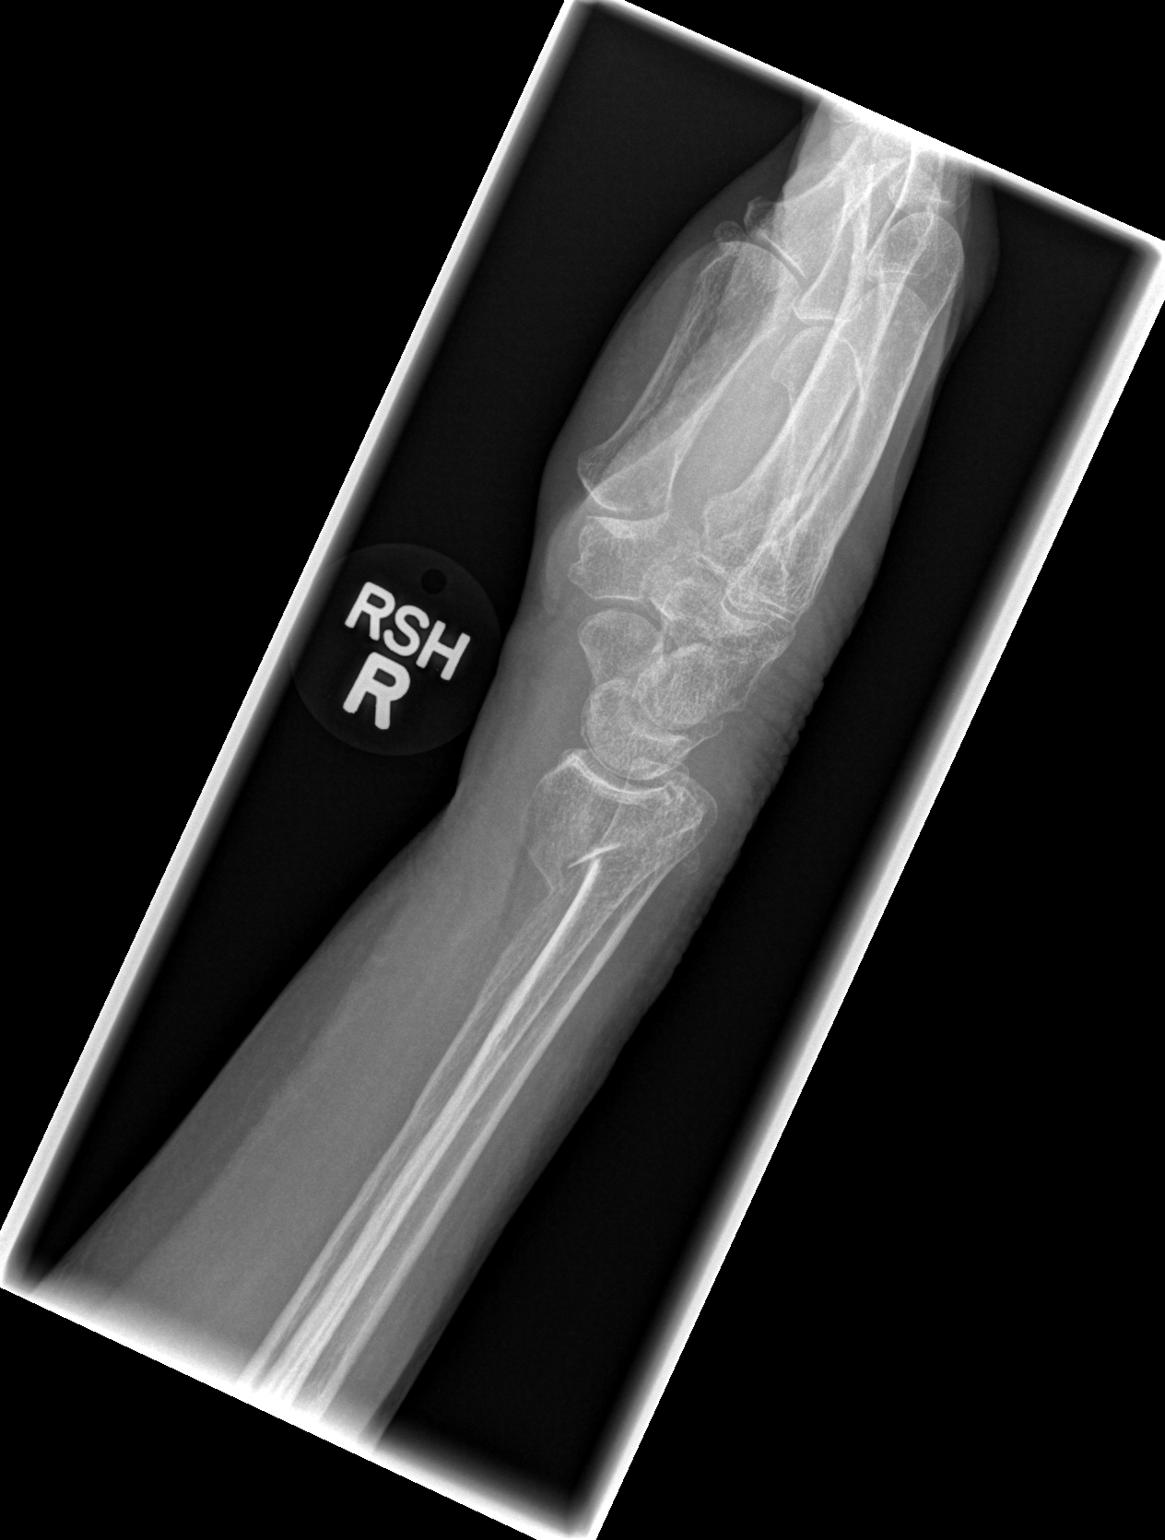

[x navicular]
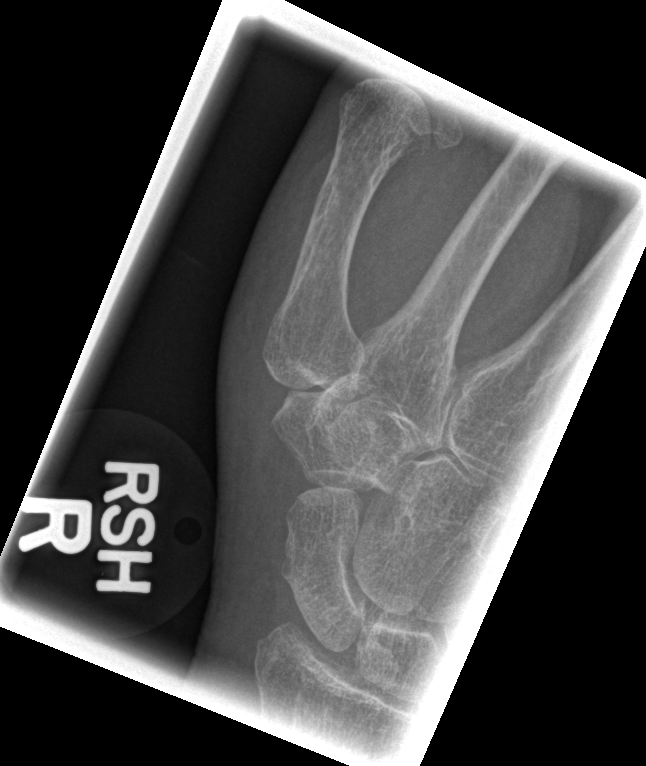

[4 of 4 positions shown; findings below may reference images not displayed]

FINDINGS: Four views of the wrist demonstrate a displaced fracture
involving the distal radius.  There is volar angulation of the
distal fragment.  It is unclear if the fracture involves the wrist
articulating surface.  The distal ulna appears to be intact and
there is diffuse osteopenia. Carpal bones are intact.
IMPRESSION: Displaced fracture of the distal right radius.

## 2014-02-23 IMAGING — CR DG CHEST 2V
2 series · 2 of 2 positions shown · non-contrast
Comparison: 10/12/2011

CLINICAL DATA: Preop right wrist fracture.Hypertension, shortness
of breath, COPD, smoker.

CHEST - 2 VIEW

[view not recorded (1 of 2)]
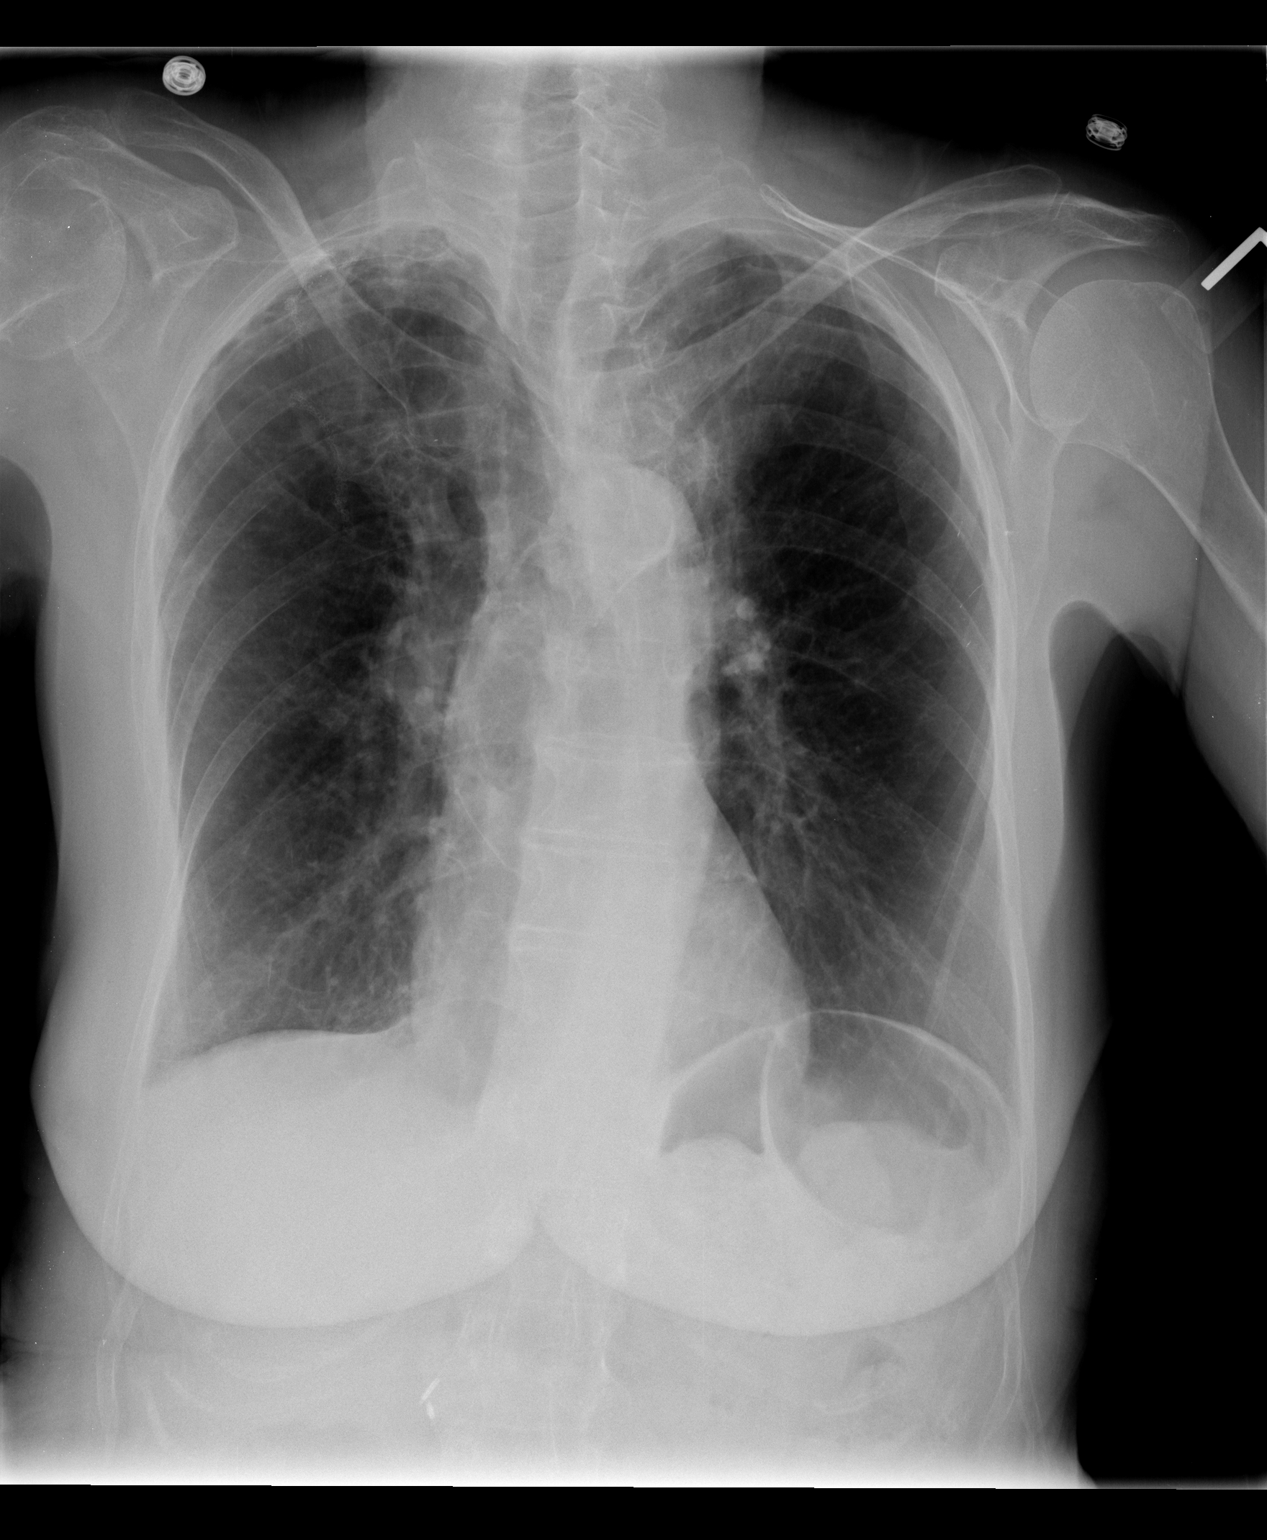

[view not recorded (2 of 2)]
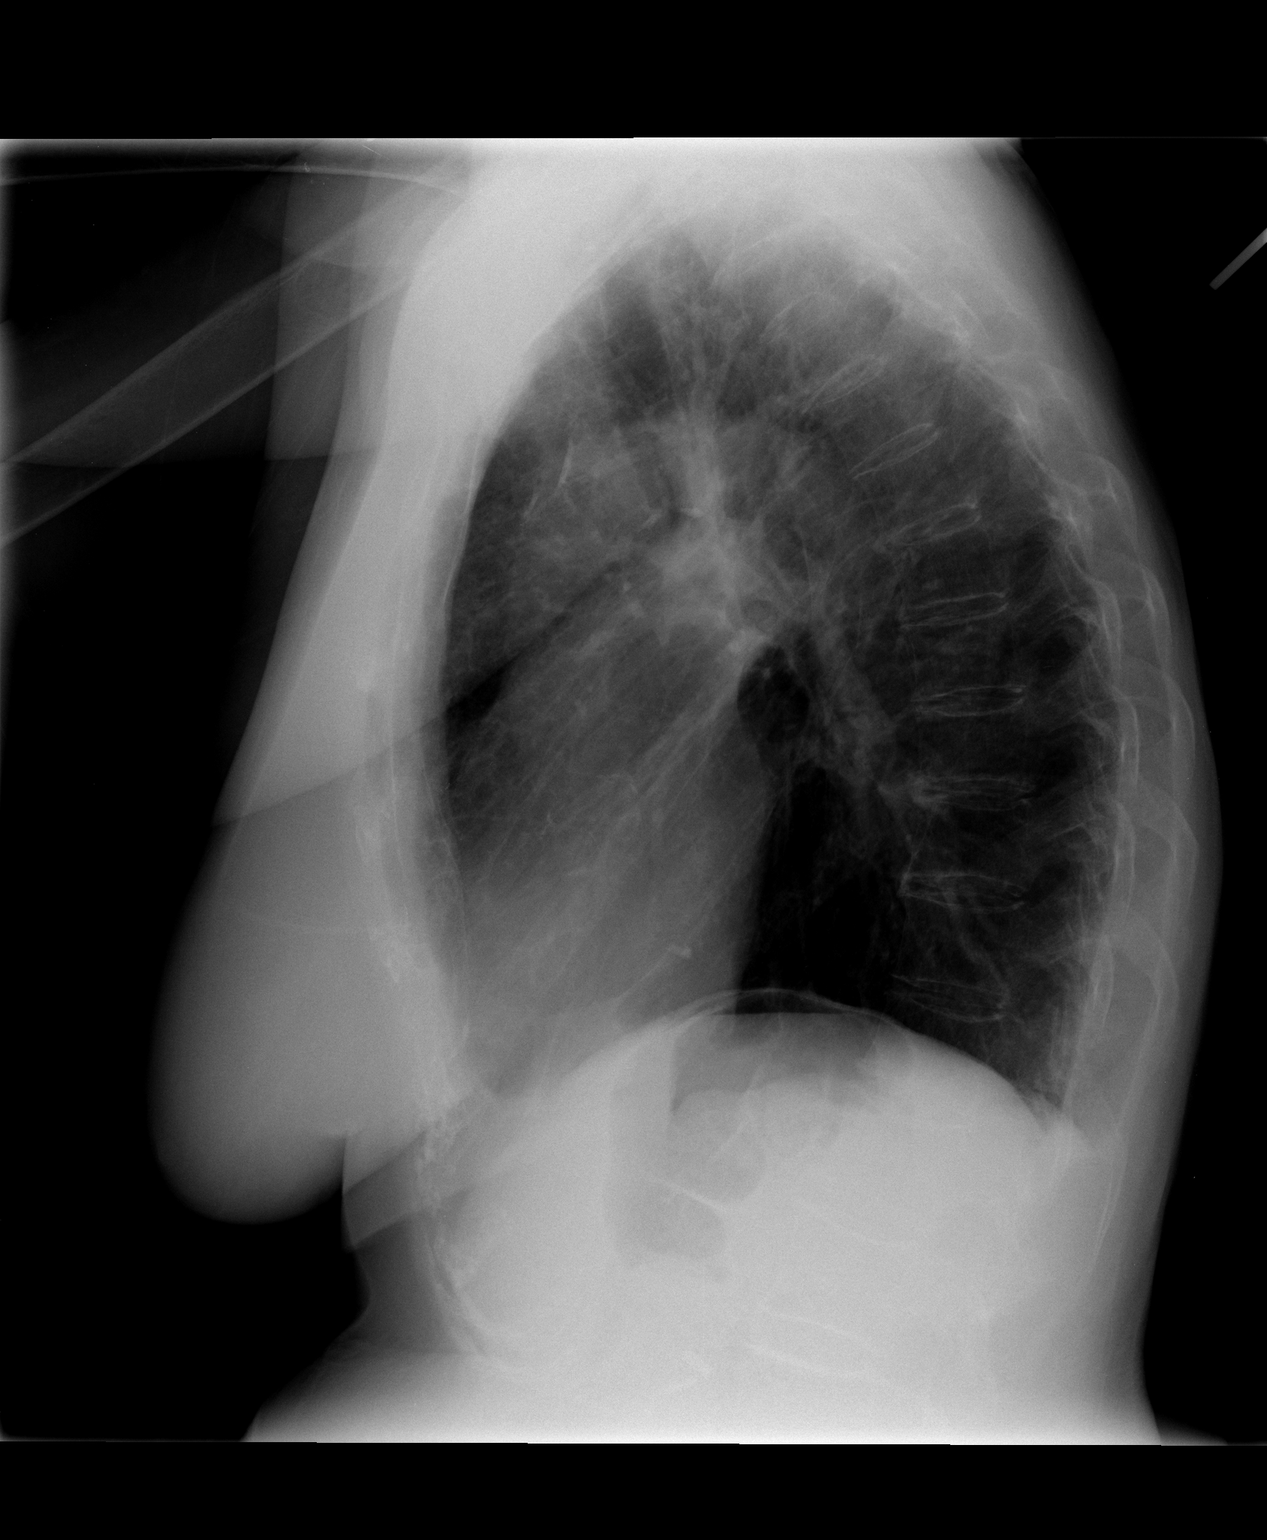

[2 of 2 positions shown; findings below may reference images not displayed]

FINDINGS: There is hyperinflation of the lungs compatible with
COPD.  There is a scarring in the upper lobes bilaterally,
unchanged.  Heart is normal size.  No effusions.  No acute bony
abnormality.
IMPRESSION: COPD/chronic changes.  No active disease or change.

## 2014-03-16 DIAGNOSIS — J449 Chronic obstructive pulmonary disease, unspecified: Secondary | ICD-10-CM | POA: Diagnosis not present

## 2014-04-12 DIAGNOSIS — J449 Chronic obstructive pulmonary disease, unspecified: Secondary | ICD-10-CM | POA: Diagnosis not present

## 2014-04-16 DIAGNOSIS — J449 Chronic obstructive pulmonary disease, unspecified: Secondary | ICD-10-CM | POA: Diagnosis not present

## 2014-04-27 ENCOUNTER — Emergency Department (HOSPITAL_COMMUNITY): Payer: Commercial Managed Care - HMO

## 2014-04-27 ENCOUNTER — Encounter (HOSPITAL_COMMUNITY): Payer: Self-pay | Admitting: *Deleted

## 2014-04-27 ENCOUNTER — Inpatient Hospital Stay (HOSPITAL_COMMUNITY)
Admission: EM | Admit: 2014-04-27 | Discharge: 2014-04-30 | DRG: 812 | Disposition: A | Discharge status: Home / Self Care | Payer: Commercial Managed Care - HMO | Attending: Internal Medicine | Admitting: Internal Medicine

## 2014-04-27 DIAGNOSIS — D649 Anemia, unspecified: Secondary | ICD-10-CM | POA: Diagnosis not present

## 2014-04-27 DIAGNOSIS — I10 Essential (primary) hypertension: Secondary | ICD-10-CM | POA: Diagnosis not present

## 2014-04-27 DIAGNOSIS — J42 Unspecified chronic bronchitis: Secondary | ICD-10-CM | POA: Diagnosis not present

## 2014-04-27 DIAGNOSIS — R0602 Shortness of breath: Secondary | ICD-10-CM | POA: Diagnosis present

## 2014-04-27 DIAGNOSIS — J45909 Unspecified asthma, uncomplicated: Secondary | ICD-10-CM | POA: Diagnosis present

## 2014-04-27 DIAGNOSIS — R0609 Other forms of dyspnea: Secondary | ICD-10-CM | POA: Diagnosis not present

## 2014-04-27 DIAGNOSIS — N189 Chronic kidney disease, unspecified: Secondary | ICD-10-CM | POA: Diagnosis not present

## 2014-04-27 DIAGNOSIS — N10 Acute tubulo-interstitial nephritis: Secondary | ICD-10-CM

## 2014-04-27 DIAGNOSIS — I2510 Atherosclerotic heart disease of native coronary artery without angina pectoris: Secondary | ICD-10-CM | POA: Diagnosis present

## 2014-04-27 DIAGNOSIS — Z7901 Long term (current) use of anticoagulants: Secondary | ICD-10-CM

## 2014-04-27 DIAGNOSIS — D509 Iron deficiency anemia, unspecified: Secondary | ICD-10-CM | POA: Diagnosis not present

## 2014-04-27 DIAGNOSIS — J189 Pneumonia, unspecified organism: Secondary | ICD-10-CM | POA: Diagnosis not present

## 2014-04-27 DIAGNOSIS — D6489 Other specified anemias: Secondary | ICD-10-CM | POA: Diagnosis not present

## 2014-04-27 DIAGNOSIS — Z87891 Personal history of nicotine dependence: Secondary | ICD-10-CM

## 2014-04-27 DIAGNOSIS — J439 Emphysema, unspecified: Secondary | ICD-10-CM | POA: Diagnosis not present

## 2014-04-27 DIAGNOSIS — I129 Hypertensive chronic kidney disease with stage 1 through stage 4 chronic kidney disease, or unspecified chronic kidney disease: Secondary | ICD-10-CM | POA: Diagnosis not present

## 2014-04-27 DIAGNOSIS — I252 Old myocardial infarction: Secondary | ICD-10-CM

## 2014-04-27 DIAGNOSIS — R918 Other nonspecific abnormal finding of lung field: Secondary | ICD-10-CM

## 2014-04-27 DIAGNOSIS — D696 Thrombocytopenia, unspecified: Secondary | ICD-10-CM | POA: Diagnosis present

## 2014-04-27 DIAGNOSIS — E785 Hyperlipidemia, unspecified: Secondary | ICD-10-CM | POA: Diagnosis present

## 2014-04-27 DIAGNOSIS — R319 Hematuria, unspecified: Secondary | ICD-10-CM | POA: Diagnosis not present

## 2014-04-27 DIAGNOSIS — J449 Chronic obstructive pulmonary disease, unspecified: Secondary | ICD-10-CM | POA: Insufficient documentation

## 2014-04-27 DIAGNOSIS — H9193 Unspecified hearing loss, bilateral: Secondary | ICD-10-CM | POA: Diagnosis present

## 2014-04-27 DIAGNOSIS — I400 Infective myocarditis: Secondary | ICD-10-CM | POA: Diagnosis not present

## 2014-04-27 DIAGNOSIS — I480 Paroxysmal atrial fibrillation: Secondary | ICD-10-CM | POA: Diagnosis not present

## 2014-04-27 DIAGNOSIS — N390 Urinary tract infection, site not specified: Secondary | ICD-10-CM | POA: Diagnosis not present

## 2014-04-27 DIAGNOSIS — K219 Gastro-esophageal reflux disease without esophagitis: Secondary | ICD-10-CM | POA: Diagnosis present

## 2014-04-27 DIAGNOSIS — J40 Bronchitis, not specified as acute or chronic: Secondary | ICD-10-CM | POA: Diagnosis not present

## 2014-04-27 DIAGNOSIS — R069 Unspecified abnormalities of breathing: Secondary | ICD-10-CM | POA: Diagnosis not present

## 2014-04-27 LAB — POC OCCULT BLOOD, ED: Fecal Occult Bld: NEGATIVE

## 2014-04-27 LAB — COMPREHENSIVE METABOLIC PANEL
ALT: 14 U/L (ref 0–35)
ANION GAP: 8 (ref 5–15)
AST: 17 U/L (ref 0–37)
Albumin: 2.9 g/dL — ABNORMAL LOW (ref 3.5–5.2)
Alkaline Phosphatase: 68 U/L (ref 39–117)
BUN: 12 mg/dL (ref 6–23)
CO2: 32 mmol/L (ref 19–32)
Calcium: 8.3 mg/dL — ABNORMAL LOW (ref 8.4–10.5)
Chloride: 101 mmol/L (ref 96–112)
Creatinine, Ser: 0.88 mg/dL (ref 0.50–1.10)
GFR calc Af Amer: 74 mL/min — ABNORMAL LOW (ref >90)
GFR calc non Af Amer: 64 mL/min — ABNORMAL LOW (ref >90)
Glucose, Bld: 108 mg/dL — ABNORMAL HIGH (ref 70–99)
Potassium: 3.5 mmol/L (ref 3.5–5.1)
SODIUM: 141 mmol/L (ref 135–145)
Total Bilirubin: 0.4 mg/dL (ref 0.3–1.2)
Total Protein: 7 g/dL (ref 6.0–8.3)

## 2014-04-27 LAB — CBC WITH DIFFERENTIAL/PLATELET
Basophils Absolute: 0 10*3/uL (ref 0.0–0.1)
Basophils Relative: 0 % (ref 0–1)
EOS PCT: 0 % (ref 0–5)
Eosinophils Absolute: 0 10*3/uL (ref 0.0–0.7)
HCT: 28.4 % — ABNORMAL LOW (ref 36.0–46.0)
Hemoglobin: 7.5 g/dL — ABNORMAL LOW (ref 12.0–15.0)
LYMPHS PCT: 9 % — AB (ref 12–46)
Lymphs Abs: 0.7 10*3/uL (ref 0.7–4.0)
MCH: 17.4 pg — ABNORMAL LOW (ref 26.0–34.0)
MCHC: 26.4 g/dL — AB (ref 30.0–36.0)
MCV: 65.7 fL — ABNORMAL LOW (ref 78.0–100.0)
Monocytes Absolute: 0.9 10*3/uL (ref 0.1–1.0)
Monocytes Relative: 12 % (ref 3–12)
NEUTROS ABS: 6.5 10*3/uL (ref 1.7–7.7)
Neutrophils Relative %: 80 % — ABNORMAL HIGH (ref 43–77)
Platelets: 118 10*3/uL — ABNORMAL LOW (ref 150–400)
RBC: 4.32 MIL/uL (ref 3.87–5.11)
RDW: 21.1 % — ABNORMAL HIGH (ref 11.5–15.5)
WBC: 8.1 10*3/uL (ref 4.0–10.5)

## 2014-04-27 LAB — URINALYSIS, ROUTINE W REFLEX MICROSCOPIC
BILIRUBIN URINE: NEGATIVE
GLUCOSE, UA: NEGATIVE mg/dL
KETONES UR: NEGATIVE mg/dL
Nitrite: NEGATIVE
Protein, ur: 30 mg/dL — AB
Specific Gravity, Urine: 1.01 (ref 1.005–1.030)
Urobilinogen, UA: 1 mg/dL (ref 0.0–1.0)
pH: 7 (ref 5.0–8.0)

## 2014-04-27 LAB — CBC
HCT: 26.6 % — ABNORMAL LOW (ref 36.0–46.0)
HEMOGLOBIN: 7 g/dL — AB (ref 12.0–15.0)
MCH: 17.4 pg — ABNORMAL LOW (ref 26.0–34.0)
MCHC: 26.3 g/dL — AB (ref 30.0–36.0)
MCV: 66.2 fL — ABNORMAL LOW (ref 78.0–100.0)
PLATELETS: 124 10*3/uL — AB (ref 150–400)
RBC: 4.02 MIL/uL (ref 3.87–5.11)
RDW: 20.9 % — AB (ref 11.5–15.5)
WBC: 7.2 10*3/uL (ref 4.0–10.5)

## 2014-04-27 LAB — URINE MICROSCOPIC-ADD ON

## 2014-04-27 LAB — TROPONIN I

## 2014-04-27 LAB — BRAIN NATRIURETIC PEPTIDE: B NATRIURETIC PEPTIDE 5: 109.9 pg/mL — AB (ref 0.0–100.0)

## 2014-04-27 LAB — LIPASE, BLOOD: Lipase: 21 U/L (ref 11–59)

## 2014-04-27 LAB — TSH: TSH: 2.813 u[IU]/mL (ref 0.350–4.500)

## 2014-04-27 LAB — DIGOXIN LEVEL: DIGOXIN LVL: 0.9 ng/mL (ref 0.8–2.0)

## 2014-04-27 LAB — I-STAT CG4 LACTIC ACID, ED
Lactic Acid, Venous: 1.3 mmol/L (ref 0.5–2.0)
Lactic Acid, Venous: 0.82 mmol/L (ref 0.5–2.0)

## 2014-04-27 LAB — MRSA PCR SCREENING: MRSA BY PCR: NEGATIVE

## 2014-04-27 MED ORDER — SODIUM CHLORIDE 0.9 % IV SOLN
INTRAVENOUS | Status: AC
  Administered 2014-04-27 – 2014-04-28 (×2): via INTRAVENOUS

## 2014-04-27 MED ORDER — HEPARIN (PORCINE) IN NACL 100-0.45 UNIT/ML-% IJ SOLN
750.0000 [IU]/h | INTRAMUSCULAR | Status: DC
  Administered 2014-04-27: 750 [IU]/h via INTRAVENOUS
  Filled 2014-04-27: qty 250

## 2014-04-27 MED ORDER — OXYBUTYNIN CHLORIDE ER 10 MG PO TB24
10.0000 mg | ORAL_TABLET | Freq: Every day | ORAL | Status: DC
  Administered 2014-04-27 – 2014-04-29 (×3): 10 mg via ORAL
  Filled 2014-04-27 (×4): qty 1

## 2014-04-27 MED ORDER — ONDANSETRON HCL 4 MG PO TABS: 4.0000 mg | ORAL_TABLET | Freq: Four times a day (QID) | ORAL | Status: DC | PRN

## 2014-04-27 MED ORDER — ONDANSETRON HCL 4 MG/2ML IJ SOLN: 4.0000 mg | Freq: Four times a day (QID) | INTRAMUSCULAR | Status: DC | PRN

## 2014-04-27 MED ORDER — DEXTROSE 5 % IV SOLN
500.0000 mg | Freq: Once | INTRAVENOUS | Status: AC
  Administered 2014-04-27: 500 mg via INTRAVENOUS
  Filled 2014-04-27: qty 500

## 2014-04-27 MED ORDER — FUROSEMIDE 40 MG PO TABS
40.0000 mg | ORAL_TABLET | Freq: Every day | ORAL | Status: DC
  Administered 2014-04-28 – 2014-04-30 (×3): 40 mg via ORAL
  Filled 2014-04-27 (×3): qty 1

## 2014-04-27 MED ORDER — PANTOPRAZOLE SODIUM 40 MG PO TBEC
40.0000 mg | DELAYED_RELEASE_TABLET | Freq: Every day | ORAL | Status: DC
  Administered 2014-04-28 – 2014-04-30 (×3): 40 mg via ORAL
  Filled 2014-04-27 (×3): qty 1

## 2014-04-27 MED ORDER — VITAMIN B-12 1000 MCG PO TABS
1000.0000 ug | ORAL_TABLET | Freq: Every day | ORAL | Status: DC
  Administered 2014-04-28 – 2014-04-30 (×3): 1000 ug via ORAL
  Filled 2014-04-27 (×3): qty 1

## 2014-04-27 MED ORDER — TIOTROPIUM BROMIDE MONOHYDRATE 18 MCG IN CAPS
18.0000 ug | ORAL_CAPSULE | Freq: Two times a day (BID) | RESPIRATORY_TRACT | Status: DC
  Administered 2014-04-28 – 2014-04-30 (×5): 18 ug via RESPIRATORY_TRACT
  Filled 2014-04-27: qty 5

## 2014-04-27 MED ORDER — TRAMADOL HCL 50 MG PO TABS: 50.0000 mg | ORAL_TABLET | Freq: Two times a day (BID) | ORAL | Status: DC | PRN

## 2014-04-27 MED ORDER — DEXTROSE 5 % IV SOLN
500.0000 mg | INTRAVENOUS | Status: DC
  Filled 2014-04-27: qty 500

## 2014-04-27 MED ORDER — ACETAMINOPHEN 325 MG PO TABS
650.0000 mg | ORAL_TABLET | Freq: Four times a day (QID) | ORAL | Status: DC | PRN
  Administered 2014-04-28 – 2014-04-30 (×4): 650 mg via ORAL
  Filled 2014-04-27 (×5): qty 2

## 2014-04-27 MED ORDER — DIGOXIN 125 MCG PO TABS
0.1250 mg | ORAL_TABLET | Freq: Every day | ORAL | Status: DC
  Administered 2014-04-27 – 2014-04-29 (×3): 0.125 mg via ORAL
  Filled 2014-04-27 (×5): qty 1

## 2014-04-27 MED ORDER — BUDESONIDE-FORMOTEROL FUMARATE 160-4.5 MCG/ACT IN AERO
2.0000 | INHALATION_SPRAY | Freq: Two times a day (BID) | RESPIRATORY_TRACT | Status: DC
  Administered 2014-04-27 – 2014-04-30 (×6): 2 via RESPIRATORY_TRACT
  Filled 2014-04-27: qty 6

## 2014-04-27 MED ORDER — SODIUM CHLORIDE 0.9 % IV SOLN
Freq: Once | INTRAVENOUS | Status: AC
  Administered 2014-04-28: 05:00:00 via INTRAVENOUS

## 2014-04-27 MED ORDER — IPRATROPIUM-ALBUTEROL 0.5-2.5 (3) MG/3ML IN SOLN
3.0000 mL | Freq: Once | RESPIRATORY_TRACT | Status: AC
  Administered 2014-04-27: 3 mL via RESPIRATORY_TRACT
  Filled 2014-04-27: qty 3

## 2014-04-27 MED ORDER — OMEGA-3-ACID ETHYL ESTERS 1 G PO CAPS
1.0000 g | ORAL_CAPSULE | Freq: Every day | ORAL | Status: DC
  Administered 2014-04-28 – 2014-04-30 (×3): 1 g via ORAL
  Filled 2014-04-27 (×3): qty 1

## 2014-04-27 MED ORDER — POLYSACCHARIDE IRON COMPLEX 150 MG PO CAPS
150.0000 mg | ORAL_CAPSULE | Freq: Every day | ORAL | Status: DC
  Administered 2014-04-28 – 2014-04-30 (×3): 150 mg via ORAL
  Filled 2014-04-27 (×3): qty 1

## 2014-04-27 MED ORDER — FLUTICASONE PROPIONATE 50 MCG/ACT NA SUSP
1.0000 | Freq: Every day | NASAL | Status: DC | PRN
  Filled 2014-04-27: qty 16

## 2014-04-27 MED ORDER — METOPROLOL SUCCINATE 12.5 MG HALF TABLET
12.5000 mg | ORAL_TABLET | Freq: Every day | ORAL | Status: DC
  Administered 2014-04-28 – 2014-04-30 (×3): 12.5 mg via ORAL
  Filled 2014-04-27 (×3): qty 1

## 2014-04-27 MED ORDER — ALBUTEROL SULFATE (2.5 MG/3ML) 0.083% IN NEBU
2.5000 mg | INHALATION_SOLUTION | Freq: Four times a day (QID) | RESPIRATORY_TRACT | Status: DC
  Administered 2014-04-27 – 2014-04-30 (×12): 2.5 mg via RESPIRATORY_TRACT
  Filled 2014-04-27 (×12): qty 3

## 2014-04-27 MED ORDER — DILTIAZEM HCL ER COATED BEADS 240 MG PO CP24
240.0000 mg | ORAL_CAPSULE | Freq: Every day | ORAL | Status: DC
  Administered 2014-04-28 – 2014-04-30 (×3): 240 mg via ORAL
  Filled 2014-04-27 (×5): qty 1

## 2014-04-27 MED ORDER — ACETAMINOPHEN 650 MG RE SUPP: 650.0000 mg | Freq: Four times a day (QID) | RECTAL | Status: DC | PRN

## 2014-04-27 MED ORDER — SODIUM CHLORIDE 0.9 % IJ SOLN
3.0000 mL | Freq: Two times a day (BID) | INTRAMUSCULAR | Status: DC
  Administered 2014-04-27 – 2014-04-30 (×6): 3 mL via INTRAVENOUS

## 2014-04-27 MED ORDER — CEFTRIAXONE SODIUM IN DEXTROSE 20 MG/ML IV SOLN
1.0000 g | INTRAVENOUS | Status: DC
  Administered 2014-04-28 – 2014-04-30 (×3): 1 g via INTRAVENOUS
  Filled 2014-04-27 (×4): qty 50

## 2014-04-27 MED ORDER — DEXTROSE 5 % IV SOLN
1.0000 g | Freq: Once | INTRAVENOUS | Status: AC
  Administered 2014-04-27: 1 g via INTRAVENOUS
  Filled 2014-04-27: qty 10

## 2014-04-27 MED ORDER — ATORVASTATIN CALCIUM 40 MG PO TABS
40.0000 mg | ORAL_TABLET | Freq: Every day | ORAL | Status: DC
  Administered 2014-04-28 – 2014-04-30 (×3): 40 mg via ORAL
  Filled 2014-04-27 (×3): qty 1

## 2014-04-27 NOTE — H&P (Signed)
[facility] History and Physical  [name] [name] WNI:[id number] DOB: [birth year] DOA: 04/27/2014  Referring physician: ER physician. PCP: Leonides Sake, MD   Chief Complaint: Shortness of breath.  HPI: [name] [name] is a 74 y.o. female with history of COPD, paroxysmal atrial fibrillation, chronic anemia, hyperlipidemia presents to the ER because of increasing shortness of breath. Patient states that she has chronic shortness of breath but over the last couple of weeks patient has been getting increasingly short of breath on exertion to the point that she also gets dizzy and almost lost consciousness. Denies any nausea vomiting abdominal pain or diarrhea. Denies any hematuria or any blood in the stools. In the ER patient was found to be anemic with a hemoglobin around 7 which is a drop of almost 3 g from previous. Patient also has mild chronic thrombocytopenia. Stool for occult blood in the ER was negative. Chest x-ray was showing consolidation. Patient was started on antibiotics for possible pneumonia and admitted for further management of shortness of breath which at this time most likely is from her anemia. Patient never has had any colonoscopy or EGD. Patient was admitted last year for symptomatic anemia and at that time patient had significant hematuria causing the anemia. At this time urinalysis still pending but patient denies any hematuria at this time. Patient denies any fever chills chest pain though patient does get chest tightness at times.  Review of Systems: As presented in the history of presenting illness, rest negative.  Past Medical History  Diagnosis Date  . Hypertension   . Myocardial infarction   . Shortness of breath   . Asthma   . COPD (chronic obstructive pulmonary disease)     emphysema    . Pneumonia     hx pneumonia ,chronic bronchitis   . H/O blood clots   . Chronic kidney disease     current   UTI   being treated  . GERD (gastroesophageal reflux  disease)   . Headache(784.0)     hx migraines  . Blood dyscrasia     hx blood clotts  . Arthritis    Past Surgical History  Procedure Laterality Date  . Right leg    . Hip fracture surgery      RIGHT SIDE  . Shoulder surgery      RIGHT  . Cholecystectomy    . Eye surgery      BIL CATARACT   . Orif wrist fracture  10/13/2011    Procedure: OPEN REDUCTION INTERNAL FIXATION (ORIF) WRIST FRACTURE;  Surgeon: [name], MD;  Location: [place];  Service: Orthopedics;  Laterality: Right;  Open Reduction Internal Fixation Right Distal Radius   . Abdominal hysterectomy    . Btl    . Hardware removal  01/29/2012    Procedure: HARDWARE REMOVAL;  Surgeon: [name], MD;  Location: [place];  Service: Orthopedics;  Laterality: Right;  . Hip arthroplasty  01/29/2012    Procedure: ARTHROPLASTY BIPOLAR HIP;  Surgeon: [name], MD;  Location: [facility];  Service: Orthopedics;  Laterality: Right;   Social History:  reports that she quit smoking about 10 months ago. Her smoking use included Cigarettes. She has a 50 pack-year smoking history. She does not have any smokeless tobacco history on file. She reports that she does not drink alcohol or use illicit drugs. Where does patient live home. Can patient participate in ADLs? Yes.  No Known Allergies  Family History:  Family History  Problem Relation  Age of Onset  . Cancer - Lung Mother   . Coronary artery disease Mother   . Coronary artery disease Sister   . Coronary artery disease Brother       Prior to Admission medications   Medication Sig Start Date End Date Taking? Authorizing Provider  albuterol (PROVENTIL) (2.5 MG/3ML) 0.083% nebulizer solution Take 2.5 mg by nebulization every 6 (six) hours.   Yes Historical Provider, MD  atorvastatin (LIPITOR) 40 MG tablet Take 40 mg by mouth daily.  07/02/13  Yes Historical Provider, MD  budesonide-formoterol (SYMBICORT) 160-4.5 MCG/ACT inhaler Inhale 2 puffs into the lungs 2 (two) times daily.  08/06/13  Yes Barton Dubois, MD  digoxin (LANOXIN) 0.25 MG tablet Take 0.125 mg by mouth daily.    Yes Historical Provider, MD  diltiazem (CARDIZEM CD) 240 MG 24 hr capsule Take 240 mg by mouth daily. 04/08/14  Yes Historical Provider, MD  docusate sodium (COLACE) 100 MG capsule Take 100 mg by mouth daily as needed (constipation).     Historical Provider, MD  fluticasone (FLONASE) 50 MCG/ACT nasal spray Place 1 spray into both nostrils daily as needed for allergies or rhinitis. 08/06/13  Yes Barton Dubois, MD  furosemide (LASIX) 40 MG tablet Take 40 mg by mouth daily.   Yes Historical Provider, MD  iron polysaccharides (NIFEREX) 150 MG capsule Take 1 capsule (150 mg total) by mouth 2 (two) times daily. Patient taking differently: Take 150 mg by mouth daily.  08/06/13  Yes Barton Dubois, MD  loperamide (IMODIUM) 2 MG capsule Take 2 mg by mouth 2 (two) times daily as needed for diarrhea or loose stools.    Yes Historical Provider, MD  metoprolol succinate (TOPROL-XL) 50 MG 24 hr tablet Take 12.5 mg by mouth daily. Take with or immediately following a meal.   Yes Historical Provider, MD  Omega-3 Fatty Acids (FISH OIL) 1200 MG CAPS Take 1,200 mg by mouth daily.   Yes Historical Provider, MD  OVER THE COUNTER MEDICATION Take 1 tablet by mouth at bedtime. Hyland's Leg Cramps PM for sleep and leg cramps   Yes Historical Provider, MD  oxybutynin (DITROPAN-XL) 5 MG 24 hr tablet Take 10 mg by mouth at bedtime.   Yes Historical Provider, MD  pantoprazole (PROTONIX) 40 MG tablet Take 40 mg by mouth daily.  06/12/13  Yes Historical Provider, MD  rivaroxaban (XARELTO) 20 MG TABS tablet Take 20 mg by mouth daily with supper.   Yes Historical Provider, MD  tiotropium (SPIRIVA) 18 MCG inhalation capsule Place 1 capsule (18 mcg total) into inhaler and inhale daily. Patient taking differently: Place 18 mcg into inhaler and inhale 2 (two) times daily.  08/06/13  Yes Barton Dubois, MD  traMADol (ULTRAM) 50 MG tablet Take  50-100 mg by mouth 2 (two) times daily as needed (pain).    Yes Historical Provider, MD  vitamin B-12 (CYANOCOBALAMIN) 500 MCG tablet Take 1,000 mcg by mouth daily.    Yes Historical Provider, MD    Physical Exam: Filed Vitals:   04/27/14 1830 04/27/14 1915 04/27/14 1930 04/27/14 2015  BP: 121/54 110/94 104/89 129/78  Pulse: 99 96 90 99  Temp:    98.7 F (37.1 C)  TempSrc:    Oral  Resp: 17 18 17 18   Height:    5\' 5"  (1.651 m)  Weight:    54.1 kg (119 lb 4.3 oz)  SpO2: 98% 98% 97% 98%     General:  Moderately labeled and poorly nourished.  Eyes: Anicteric no  pallor.  ENT: No discharge from the ears eyes nose or mouth.  Neck: No mass felt. No neck rigidity.  Cardiovascular: S1-S2 heard.  Respiratory: No rhonchi or crepitations.  Abdomen: Soft nontender bowel sounds present.  Skin: Pale.  Musculoskeletal: No edema.  Psychiatric: Appears normal.  Neurologic: Alert awake oriented to time place and person. Moves all extremities.  Labs on Admission:  Basic Metabolic Panel:  Recent Labs Lab 04/27/14 1632  NA 141  K 3.5  CL 101  CO2 32  GLUCOSE 108*  BUN 12  CREATININE 0.88  CALCIUM 8.3*   Liver Function Tests:  Recent Labs Lab 04/27/14 1632  AST 17  ALT 14  ALKPHOS 68  BILITOT 0.4  PROT 7.0  ALBUMIN 2.9*    Recent Labs Lab 04/27/14 1632  LIPASE 21   No results for input(s): AMMONIA in the last 168 hours. CBC:  Recent Labs Lab 04/27/14 1632  WBC 8.1  NEUTROABS 6.5  HGB 7.5*  HCT 28.4*  MCV 65.7*  PLT 118*   Cardiac Enzymes: No results for input(s): CKTOTAL, CKMB, CKMBINDEX, TROPONINI in the last 168 hours.  BNP (last 3 results) No results for input(s): BNP in the last 8760 hours.  ProBNP (last 3 results)  Recent Labs  09/24/13 1028 09/26/13 1458  PROBNP 809.3* 742.8*    CBG: No results for input(s): GLUCAP in the last 168 hours.  Radiological Exams on Admission: Dg Chest 2 View  04/27/2014   CLINICAL DATA:  Per ED  note: Pt arrives via [facility] from home. Pt has c/o SOB that was unrelieved by 2 albuterol tx this morning, and abdominal pain accompanied by diarrhea x 2 days. EMS reports diminished lung sounds on the right side and O2 sats were 86% on RA upon arrival.  EXAM: CHEST  2 VIEW  COMPARISON:  12/28/2013  FINDINGS: The heart is mildly enlarged. There is perihilar peribronchial thickening. Lung sutures are identified at the right lung apex. There is increasing opacity at the left lung apex raising the question of infiltrate or mass is. Further evaluation with CT of the chest is recommended.  IMPRESSION: 1. Bronchitic changes. 2. Developing density in the left lung apex warrants further evaluation. CT of the chest with contrast is recommended.   Electronically Signed   By: Nolon Nations M.D.   On: 04/27/2014 17:04    EKG: Independently reviewed. Normal sinus rhythm with LVH and nonspecific ST depression.  Assessment/Plan Principal Problem:   SOB (shortness of breath) Active Problems:   Hypertension   Thrombocytopenia   Paroxysmal atrial fibrillation   Microcytic anemia   1. Shortness of breath at this time most likely secondary to symptomatic anemia - I have reordered CBC and if hemoglobin is still around 7 I will order 2 units of PRBC. Closely follow CBC or blood transfusion. Patient on exam does not have any wheezing or crepitations to suggest cardiac or lung origin. Chest x-ray does show consolidation for which I have ordered a CT chest to make sure there is no mass. Check troponins and 2-D echo. 2. Macrocytic hypochromic anemia - patient's stool for occult blood is negative. I have ordered an anemia panel. Urine analysis is pending. Patient has had previous episodes of anemia with hematuria last year but at this time patient denies any hematuria. Check LDH for hemolytic process. But bilirubin is normal. Patient eventually will need colonoscopy. 3. Lung consolidation - patient has been empirically placed  on ceftriaxone and Zithromax for possible pneumonia. Follow CT chest  to further study on patient's lung consolidation make sure it's not a mass. 4. Paroxysmal atrial fibrillation presently rate controlled - continue rate limiting medications. For now I have placed patient on heparin and hold xarelto and if there is no significant fall in hemoglobin or if there is no evidence of active bleeding then may restart xarelto. 5. COPD - presently not wheezing. 6. Chronic thrombocytopenia - follow CBC. 7. Hypertension - continue present medications.   DVT Prophylaxis on full dose heparin infusion.  Code Status: Full code.  Family Communication: Patient's son at the bedside.  Disposition Plan: Admit to inpatient.    KAKRAKANDY,ARSHAD N. [facility] Pager [phone].  If 7PM-7AM, please contact night-coverage [email or web address] Password [facility] 04/27/2014, 8:21 PM

## 2014-04-27 NOTE — Progress Notes (Signed)
ANTICOAGULATION CONSULT NOTE - Initial Consult  Pharmacy Consult for heparin Indication: atrial fibrillation  No Known Allergies  Patient Measurements: Height: 5\' 5"  (165.1 cm) Weight: 119 lb 4.3 oz (54.1 kg) IBW/kg (Calculated) : 57 Heparin Dosing Weight: 54 kg  Vital Signs: Temp: 98.7 F (37.1 C) (02/13 2015) Temp Source: Oral (02/13 2015) BP: 129/78 mmHg (02/13 2015) Pulse Rate: 99 (02/13 2015)  Labs:  Recent Labs  04/27/14 1632  HGB 7.5*  HCT 28.4*  PLT 118*  CREATININE 0.88    Estimated Creatinine Clearance: 49.4 mL/min (by C-G formula based on Cr of 0.88).   Medical History: Past Medical History  Diagnosis Date  . Hypertension   . Myocardial infarction   . Shortness of breath   . Asthma   . COPD (chronic obstructive pulmonary disease)     emphysema    . Pneumonia     hx pneumonia ,chronic bronchitis   . H/O blood clots   . Chronic kidney disease     current   UTI   being treated  . GERD (gastroesophageal reflux disease)   . Headache(784.0)     hx migraines  . Blood dyscrasia     hx blood clotts  . Arthritis     Medications:  Prescriptions prior to admission  Medication Sig Dispense Refill Last Dose  . albuterol (PROVENTIL) (2.5 MG/3ML) 0.083% nebulizer solution Take 2.5 mg by nebulization every 6 (six) hours.   04/27/2014 at Unknown time  . atorvastatin (LIPITOR) 40 MG tablet Take 40 mg by mouth daily.    04/27/2014 at Unknown time  . budesonide-formoterol (SYMBICORT) 160-4.5 MCG/ACT inhaler Inhale 2 puffs into the lungs 2 (two) times daily. 1 Inhaler 12 04/27/2014 at Unknown time  . digoxin (LANOXIN) 0.25 MG tablet Take 0.125 mg by mouth daily.    04/26/2014 at Unknown time  . diltiazem (CARDIZEM CD) 240 MG 24 hr capsule Take 240 mg by mouth daily.  1 04/27/2014 at Unknown time  . docusate sodium (COLACE) 100 MG capsule Take 100 mg by mouth daily as needed (constipation).    Unk  . fluticasone (FLONASE) 50 MCG/ACT nasal spray Place 1 spray into both  nostrils daily as needed for allergies or rhinitis.   04/27/2014 at Unknown time  . furosemide (LASIX) 40 MG tablet Take 40 mg by mouth daily.   04/26/2014 at Unknown time  . iron polysaccharides (NIFEREX) 150 MG capsule Take 1 capsule (150 mg total) by mouth 2 (two) times daily. (Patient taking differently: Take 150 mg by mouth daily. ) 60 capsule 1 04/27/2014 at Unknown time  . loperamide (IMODIUM) 2 MG capsule Take 2 mg by mouth 2 (two) times daily as needed for diarrhea or loose stools.    04/27/2014 at Unknown time  . metoprolol succinate (TOPROL-XL) 50 MG 24 hr tablet Take 12.5 mg by mouth daily. Take with or immediately following a meal.   04/27/2014 at 0800  . Omega-3 Fatty Acids (FISH OIL) 1200 MG CAPS Take 1,200 mg by mouth daily.   04/27/2014 at Unknown time  . OVER THE COUNTER MEDICATION Take 1 tablet by mouth at bedtime. Hyland's Leg Cramps PM for sleep and leg cramps   Past Week at Unknown time  . oxybutynin (DITROPAN-XL) 5 MG 24 hr tablet Take 10 mg by mouth at bedtime.   04/26/2014 at Unknown time  . pantoprazole (PROTONIX) 40 MG tablet Take 40 mg by mouth daily.    04/27/2014 at Unknown time  . rivaroxaban (XARELTO) 20 MG  TABS tablet Take 20 mg by mouth daily with supper.   04/26/2014 at Unknown time  . tiotropium (SPIRIVA) 18 MCG inhalation capsule Place 1 capsule (18 mcg total) into inhaler and inhale daily. (Patient taking differently: Place 18 mcg into inhaler and inhale 2 (two) times daily. ) 30 capsule 12 04/27/2014 at Unknown time  . traMADol (ULTRAM) 50 MG tablet Take 50-100 mg by mouth 2 (two) times daily as needed (pain).    Past Week at Unknown time  . vitamin B-12 (CYANOCOBALAMIN) 500 MCG tablet Take 1,000 mcg by mouth daily.    04/27/2014 at Unknown time    Assessment: 74 yo female with SOB and abdominal pain.  Pt is on Xarelto PTA for AFib. Last dose was given yesterday.  Will continue heparin while inpatient.  Hgb 7.5, plts 118, discussed with MD, based on FOB (-), will continue  heparin gtt at this time.  Goal of Therapy:  Heparin level 0.3-0.7 units/ml Monitor platelets by anticoagulation protocol: Yes   Plan:   Heparin 750 units/hr Daily HL, CBC Monitor for s/sx bleeding   Hughes Better, PharmD, BCPS Clinical Pharmacist Pager: [phone] 04/27/2014 8:30 PM

## 2014-04-27 NOTE — ED Notes (Signed)
Patient given drink and meal bag per RN

## 2014-04-27 NOTE — ED Provider Notes (Signed)
CSN: [id number]     Arrival date & time 04/27/14  1555 History   First MD Initiated Contact with Patient 04/27/14 1601     Chief Complaint  Patient presents with  . Shortness of Breath  . Abdominal Pain     (Consider location/radiation/quality/duration/timing/severity/associated sxs/prior Treatment) Patient is a 74 y.o. female presenting with shortness of breath. The history is provided by the patient.  Shortness of Breath Severity:  Moderate Onset quality:  Gradual Duration:  1 day Timing:  Constant Progression:  Worsening Chronicity:  New Context: weather changes   Relieved by:  Nothing Worsened by:  Exertion and movement Ineffective treatments:  Oxygen Associated symptoms: abdominal pain, cough, sore throat and sputum production   Associated symptoms: no chest pain, no fever, no headaches, no neck pain, no rash and no wheezing   Risk factors: tobacco use   Risk factors: no prolonged immobilization     Past Medical History  Diagnosis Date  . Hypertension   . Myocardial infarction   . Shortness of breath   . Asthma   . COPD (chronic obstructive pulmonary disease)     emphysema    . Pneumonia     hx pneumonia ,chronic bronchitis   . H/O blood clots   . Chronic kidney disease     current   UTI   being treated  . GERD (gastroesophageal reflux disease)   . Headache(784.0)     hx migraines  . Blood dyscrasia     hx blood clotts  . Arthritis    Past Surgical History  Procedure Laterality Date  . Right leg    . Hip fracture surgery      RIGHT SIDE  . Shoulder surgery      RIGHT  . Cholecystectomy    . Eye surgery      BIL CATARACT   . Orif wrist fracture  10/13/2011    Procedure: OPEN REDUCTION INTERNAL FIXATION (ORIF) WRIST FRACTURE;  Surgeon: [name], MD;  Location: [place];  Service: Orthopedics;  Laterality: Right;  Open Reduction Internal Fixation Right Distal Radius   . Abdominal hysterectomy    . Btl    . Hardware removal  01/29/2012    Procedure:  HARDWARE REMOVAL;  Surgeon: [name], MD;  Location: [place];  Service: Orthopedics;  Laterality: Right;  . Hip arthroplasty  01/29/2012    Procedure: ARTHROPLASTY BIPOLAR HIP;  Surgeon: [name], MD;  Location: [place];  Service: Orthopedics;  Laterality: Right;   Family History  Problem Relation Age of Onset  . Cancer - Lung Mother   . Coronary artery disease Mother   . Coronary artery disease Sister   . Coronary artery disease Brother    History  Substance Use Topics  . Smoking status: Former Smoker -- 1.00 packs/day for 50 years    Types: Cigarettes    Quit date: 06/27/2013  . Smokeless tobacco: Not on file  . Alcohol Use: No   OB History    No data available     Review of Systems  Constitutional: Positive for fatigue. Negative for fever and chills.  HENT: Positive for sore throat. Negative for congestion and rhinorrhea.   Eyes: Negative for visual disturbance.  Respiratory: Positive for cough, sputum production and shortness of breath. Negative for wheezing.   Cardiovascular: Negative for chest pain.  Gastrointestinal: Positive for nausea, abdominal pain and diarrhea.  Musculoskeletal: Negative for neck pain.  Skin: Negative for rash.  Allergic/Immunologic: Negative for immunocompromised  state.  Neurological: Negative for dizziness, weakness and headaches.      Allergies  Review of patient's allergies indicates no known allergies.  Home Medications   Prior to Admission medications   Medication Sig Start Date End Date Taking? Authorizing Provider  albuterol (PROVENTIL) (2.5 MG/3ML) 0.083% nebulizer solution Take 2.5 mg by nebulization every 6 (six) hours.    Historical Provider, MD  atorvastatin (LIPITOR) 40 MG tablet Take 40 mg by mouth daily.  07/02/13   Historical Provider, MD  budesonide-formoterol (SYMBICORT) 160-4.5 MCG/ACT inhaler Inhale 2 puffs into the lungs 2 (two) times daily. 08/06/13   Barton Dubois, MD  digoxin (LANOXIN) 0.25 MG tablet Take  0.125-0.25 mg by mouth daily.     Historical Provider, MD  diltiazem (DILACOR XR) 240 MG 24 hr capsule Take 240 mg by mouth daily.  06/29/13   Historical Provider, MD  docusate sodium (COLACE) 100 MG capsule Take 100 mg by mouth daily as needed (constipation).     Historical Provider, MD  fluticasone (FLONASE) 50 MCG/ACT nasal spray Place 1 spray into both nostrils daily as needed for allergies or rhinitis. 08/06/13   Barton Dubois, MD  furosemide (LASIX) 40 MG tablet Take 40 mg by mouth daily.    Historical Provider, MD  iron polysaccharides (NIFEREX) 150 MG capsule Take 1 capsule (150 mg total) by mouth 2 (two) times daily. 08/06/13   Barton Dubois, MD  loperamide (IMODIUM) 2 MG capsule Take 2 mg by mouth 2 (two) times daily as needed for diarrhea or loose stools.     Historical Provider, MD  metoprolol succinate (TOPROL-XL) 50 MG 24 hr tablet Take 12.5 mg by mouth daily. Take with or immediately following a meal.    Historical Provider, MD  Omega-3 Fatty Acids (FISH OIL) 1200 MG CAPS Take 1,200 mg by mouth daily.    Historical Provider, MD  OVER THE COUNTER MEDICATION Take 1 tablet by mouth at bedtime. Hyland's Leg Cramps PM for sleep and leg cramps    Historical Provider, MD  oxybutynin (DITROPAN-XL) 5 MG 24 hr tablet Take 10 mg by mouth at bedtime.    Historical Provider, MD  pantoprazole (PROTONIX) 40 MG tablet Take 40 mg by mouth daily.  06/12/13   Historical Provider, MD  rivaroxaban (XARELTO) 20 MG TABS tablet Take 20 mg by mouth daily with supper.    Historical Provider, MD  tiotropium (SPIRIVA) 18 MCG inhalation capsule Place 1 capsule (18 mcg total) into inhaler and inhale daily. 08/06/13   Barton Dubois, MD  traMADol (ULTRAM) 50 MG tablet Take 50-100 mg by mouth 2 (two) times daily as needed (pain).     Historical Provider, MD  vitamin B-12 (CYANOCOBALAMIN) 500 MCG tablet Take 1,000 mcg by mouth daily.     Historical Provider, MD   Temp(Src) 98.3 F (36.8 C) (Oral)  SpO2 98% Physical Exam   Constitutional: She is oriented to person, place, and time. She appears well-developed and well-nourished. No distress.  HENT:  Head: Normocephalic.  Mouth/Throat: No oropharyngeal exudate.  Eyes: Pupils are equal, round, and reactive to light.  Neck: Neck supple.  Cardiovascular: Normal rate.  Exam reveals no friction rub.   No murmur heard. Pulmonary/Chest: Effort normal. No respiratory distress. She has decreased breath sounds (diffusely). She has wheezes (scattered, diffuse). She has rhonchi. She has rales in the right lower field and the left lower field.  Abdominal: Soft. She exhibits no distension. There is no tenderness. There is no rigidity, no rebound, no guarding, no  tenderness at McBurney's point and negative Murphy's sign.  Genitourinary: Rectal exam shows no tenderness. Guaiac negative stool.  Soft brown stool in rectal vault  Musculoskeletal: She exhibits no edema.  Neurological: She is alert and oriented to person, place, and time.  Skin: Skin is warm. No rash noted.  Nursing note and vitals reviewed.   ED Course  Procedures (including critical care time) Labs Review Labs Reviewed  COMPREHENSIVE METABOLIC PANEL - Abnormal; Notable for the following:    Glucose, Bld 108 (*)    Calcium 8.3 (*)    Albumin 2.9 (*)    GFR calc non Af Amer 64 (*)    GFR calc Af Amer 74 (*)    All other components within normal limits  CBC WITH DIFFERENTIAL/PLATELET - Abnormal; Notable for the following:    Hemoglobin 7.5 (*)    HCT 28.4 (*)    MCV 65.7 (*)    MCH 17.4 (*)    MCHC 26.4 (*)    RDW 21.1 (*)    Platelets 118 (*)    Neutrophils Relative % 80 (*)    Lymphocytes Relative 9 (*)    All other components within normal limits  URINALYSIS, ROUTINE W REFLEX MICROSCOPIC - Abnormal; Notable for the following:    APPearance TURBID (*)    Hgb urine dipstick MODERATE (*)    Protein, ur 30 (*)    Leukocytes, UA LARGE (*)    All other components within normal limits  URINE  MICROSCOPIC-ADD ON - Abnormal; Notable for the following:    Bacteria, UA MANY (*)    All other components within normal limits  BRAIN NATRIURETIC PEPTIDE - Abnormal; Notable for the following:    B Natriuretic Peptide 109.9 (*)    All other components within normal limits  CBC - Abnormal; Notable for the following:    Hemoglobin 7.0 (*)    HCT 26.6 (*)    MCV 66.2 (*)    MCH 17.4 (*)    MCHC 26.3 (*)    RDW 20.9 (*)    Platelets 124 (*)    All other components within normal limits  MRSA PCR SCREENING  CULTURE, BLOOD (ROUTINE X 2)  CULTURE, BLOOD (ROUTINE X 2)  CLOSTRIDIUM DIFFICILE BY PCR  STOOL CULTURE  LIPASE, BLOOD  DIGOXIN LEVEL  TROPONIN I  TSH  COMPREHENSIVE METABOLIC PANEL  CBC WITH DIFFERENTIAL/PLATELET  CBC  HEPARIN LEVEL (UNFRACTIONATED)  CBC  I-STAT CG4 LACTIC ACID, ED  POC OCCULT BLOOD, ED  I-STAT CG4 LACTIC ACID, ED  TYPE AND SCREEN  PREPARE RBC (CROSSMATCH)    Imaging Review Dg Chest 2 View  04/27/2014   CLINICAL DATA:  Per ED note: Pt arrives via [facility] from home. Pt has c/o SOB that was unrelieved by 2 albuterol tx this morning, and abdominal pain accompanied by diarrhea x 2 days. EMS reports diminished lung sounds on the right side and O2 sats were 86% on RA upon arrival.  EXAM: CHEST  2 VIEW  COMPARISON:  12/28/2013  FINDINGS: The heart is mildly enlarged. There is perihilar peribronchial thickening. Lung sutures are identified at the right lung apex. There is increasing opacity at the left lung apex raising the question of infiltrate or mass is. Further evaluation with CT of the chest is recommended.  IMPRESSION: 1. Bronchitic changes. 2. Developing density in the left lung apex warrants further evaluation. CT of the chest with contrast is recommended.   Electronically Signed   By: Nolon Nations M.D.  On: 04/27/2014 17:04     EKG Interpretation   Date/Time:  Saturday April 27 2014 16:07:20 EST Ventricular Rate:  93 PR Interval:  132 QRS  Duration: 84 QT Interval:  305 QTC Calculation: 379 R Axis:   80 Text Interpretation:  Sinus rhythm Atrial premature complexes Probable  left atrial enlargement LVH with secondary repolarization abnormality ST  depression, consider ischemia, diffuse lds Confirmed by Zenia Resides  MD, ANTHONY  ([id number]) on 04/27/2014 5:01:38 PM      MDM   Final diagnoses:  SOB (shortness of breath)  Lung mass    74 yo F with PMHx of HTN, CAD s/p MI, COPD, pAF on Xarelto, who presents with shortness of breath, mild abdominal pain, and diarrhea x 2 days. No fevers. See HPI above. On arrival, T 98.17F, HR 90, RR 17, BP 148/43 (117/54 on repeat), O2Sat 98% on RA, satting 98% on 2L [place]. Exam as above, remarkable for mildly diminished breath sounds, diffuse scattered wheezes, but overall normal WOB, speaking in full sentences.   Pt's presentation is most c/f acute respiratory distress, likely 2/2 COPD exacerbation versus PNA. Pt has also had mild nausea, diarrhea, c/f possible viral syndrome. EKG on arrival shows NSR with PACs, non-specific ST depressions but no evidence of acute STEMI. Will send troponin but suspect this is demand related. Will obtain broad labs, CXR, and re-assess. Re: abdominal pain, her abdomen is completely soft, NT, and ND with no acute rebound, rigidity, or guarding. Do not feel CT imaging indicated at this time.  CBC with no leukocytosis but notable new anemia with Hgb 7.5, microcytic with MCV of 65.7 c/w likely iron-deficiency anemia. Pt is on Xarelto. Rectal exam performed with soft brown stool, heme-occult negative. Unclear etiology of anemia - suspect acute on chronic IDA with likely GI source on Xarelto. Abdomen remains soft, NT, ND and LFTs, Lipase normal. Lactic acid 1.30. CXR reviewed, remarkable for bronchitic changes and concern for LUL mass with possible PNA. Will treat with Rocephin, Azithro and admit for possible PNA, though with acute anemia suspect this is contributing to pt's sx as well.  Type and screen sent. No active GI or other bleeding at this time.   Clinical Impression: 1. CAP (community acquired pneumonia)   2. SOB (shortness of breath)   3. Lung mass   4. Microcytic anemia   5. Chronic bronchitis, unspecified chronic bronchitis type     Disposition: Admit  Pt seen in conjunction with Dr. Raynelle Dick, MD 04/28/14 [phone]

## 2014-04-27 NOTE — ED Provider Notes (Signed)
I saw and evaluated the patient, reviewed the resident's note and I agree with the findings and plan.   EKG Interpretation   Date/Time:  Saturday April 27 2014 16:07:20 EST Ventricular Rate:  93 PR Interval:  132 QRS Duration: 84 QT Interval:  305 QTC Calculation: 379 R Axis:   80 Text Interpretation:  Sinus rhythm Atrial premature complexes Probable  left atrial enlargement LVH with secondary repolarization abnormality ST  depression, consider ischemia, diffuse lds Confirmed by Yoona Ishii  MD, Alianys Chacko  ([id number]) on 04/27/2014 5:01:38 PM     Patient here with shortness of breath and history of COPD. Chest x-ray consistent with pneumonia. Will start on antibiotics and admitted to the hospitalist service  Leota Jacobsen, MD 04/27/14 1718

## 2014-04-27 NOTE — ED Notes (Signed)
Attempted report to [facility], Therapist, sports.

## 2014-04-27 NOTE — ED Notes (Signed)
POC Occult blood sample test result was negative

## 2014-04-27 NOTE — ED Notes (Signed)
Pt arrives via [facility] from home. Pt has c/o SOB that was unrelieved by 2 albuterol tx this morning, and abdominal pain accompanied by diarrhea x 2days. EMS reports diminished lung sounds on the right side and O2 sats were 86% on RA upon arrival.

## 2014-04-28 ENCOUNTER — Encounter (HOSPITAL_COMMUNITY): Payer: Self-pay | Admitting: Radiology

## 2014-04-28 ENCOUNTER — Inpatient Hospital Stay (HOSPITAL_COMMUNITY): Payer: Commercial Managed Care - HMO

## 2014-04-28 DIAGNOSIS — J449 Chronic obstructive pulmonary disease, unspecified: Secondary | ICD-10-CM | POA: Insufficient documentation

## 2014-04-28 DIAGNOSIS — N10 Acute tubulo-interstitial nephritis: Secondary | ICD-10-CM

## 2014-04-28 DIAGNOSIS — I2510 Atherosclerotic heart disease of native coronary artery without angina pectoris: Secondary | ICD-10-CM

## 2014-04-28 DIAGNOSIS — I10 Essential (primary) hypertension: Secondary | ICD-10-CM | POA: Insufficient documentation

## 2014-04-28 DIAGNOSIS — J441 Chronic obstructive pulmonary disease with (acute) exacerbation: Secondary | ICD-10-CM

## 2014-04-28 LAB — CBC WITH DIFFERENTIAL/PLATELET
BASOS PCT: 0 % (ref 0–1)
Basophils Absolute: 0 10*3/uL (ref 0.0–0.1)
EOS PCT: 1 % (ref 0–5)
Eosinophils Absolute: 0.1 10*3/uL (ref 0.0–0.7)
HEMATOCRIT: 25.3 % — AB (ref 36.0–46.0)
Hemoglobin: 6.6 g/dL — CL (ref 12.0–15.0)
LYMPHS PCT: 18 % (ref 12–46)
Lymphs Abs: 0.9 10*3/uL (ref 0.7–4.0)
MCH: 17.1 pg — ABNORMAL LOW (ref 26.0–34.0)
MCHC: 26.1 g/dL — ABNORMAL LOW (ref 30.0–36.0)
MCV: 65.7 fL — ABNORMAL LOW (ref 78.0–100.0)
Monocytes Absolute: 1 10*3/uL (ref 0.1–1.0)
Monocytes Relative: 20 % — ABNORMAL HIGH (ref 3–12)
NEUTROS ABS: 3.1 10*3/uL (ref 1.7–7.7)
Neutrophils Relative %: 61 % (ref 43–77)
Platelets: 101 10*3/uL — ABNORMAL LOW (ref 150–400)
RBC: 3.85 MIL/uL — ABNORMAL LOW (ref 3.87–5.11)
RDW: 21.1 % — ABNORMAL HIGH (ref 11.5–15.5)
WBC: 5.1 10*3/uL (ref 4.0–10.5)

## 2014-04-28 LAB — CBC
HCT: 37.9 % (ref 36.0–46.0)
HEMOGLOBIN: 11.2 g/dL — AB (ref 12.0–15.0)
MCH: 20.6 pg — ABNORMAL LOW (ref 26.0–34.0)
MCHC: 29.6 g/dL — AB (ref 30.0–36.0)
MCV: 69.7 fL — ABNORMAL LOW (ref 78.0–100.0)
Platelets: 90 10*3/uL — ABNORMAL LOW (ref 150–400)
RBC: 5.44 MIL/uL — ABNORMAL HIGH (ref 3.87–5.11)
RDW: 23 % — ABNORMAL HIGH (ref 11.5–15.5)
WBC: 7 10*3/uL (ref 4.0–10.5)

## 2014-04-28 LAB — COMPREHENSIVE METABOLIC PANEL
ALT: 13 U/L (ref 0–35)
AST: 16 U/L (ref 0–37)
Albumin: 2.5 g/dL — ABNORMAL LOW (ref 3.5–5.2)
Alkaline Phosphatase: 55 U/L (ref 39–117)
Anion gap: 3 — ABNORMAL LOW (ref 5–15)
BUN: 13 mg/dL (ref 6–23)
CO2: 34 mmol/L — ABNORMAL HIGH (ref 19–32)
Calcium: 8.1 mg/dL — ABNORMAL LOW (ref 8.4–10.5)
Chloride: 104 mmol/L (ref 96–112)
Creatinine, Ser: 0.7 mg/dL (ref 0.50–1.10)
GFR calc Af Amer: >90 mL/min (ref >90)
GFR calc non Af Amer: 85 mL/min — ABNORMAL LOW (ref >90)
Glucose, Bld: 82 mg/dL (ref 70–99)
Potassium: 3.5 mmol/L (ref 3.5–5.1)
SODIUM: 141 mmol/L (ref 135–145)
Total Bilirubin: 0.5 mg/dL (ref 0.3–1.2)
Total Protein: 6 g/dL (ref 6.0–8.3)

## 2014-04-28 LAB — APTT
aPTT: 61 seconds — ABNORMAL HIGH (ref 24–37)
aPTT: 50 seconds — ABNORMAL HIGH (ref 24–37)

## 2014-04-28 LAB — PREPARE RBC (CROSSMATCH)

## 2014-04-28 LAB — TROPONIN I
TROPONIN I: 0.03 ng/mL (ref <0.031)
Troponin I: 0.03 ng/mL (ref <0.031)

## 2014-04-28 LAB — RETICULOCYTES
RBC.: 5.59 MIL/uL — ABNORMAL HIGH (ref 3.87–5.11)
Retic Count, Absolute: 72.7 10*3/uL (ref 19.0–186.0)
Retic Ct Pct: 1.3 % (ref 0.4–3.1)

## 2014-04-28 LAB — LACTATE DEHYDROGENASE: LDH: 136 U/L (ref 94–250)

## 2014-04-28 LAB — HEPARIN LEVEL (UNFRACTIONATED): HEPARIN UNFRACTIONATED: 0.98 [IU]/mL — AB (ref 0.30–0.70)

## 2014-04-28 MED ORDER — HEPARIN (PORCINE) IN NACL 100-0.45 UNIT/ML-% IJ SOLN
1000.0000 [IU]/h | INTRAMUSCULAR | Status: DC
  Administered 2014-04-29: 1000 [IU]/h via INTRAVENOUS
  Filled 2014-04-28 (×4): qty 250

## 2014-04-28 MED ORDER — ALBUTEROL SULFATE (2.5 MG/3ML) 0.083% IN NEBU
2.5000 mg | INHALATION_SOLUTION | Freq: Four times a day (QID) | RESPIRATORY_TRACT | Status: DC | PRN
  Administered 2014-04-28: 2.5 mg via RESPIRATORY_TRACT
  Filled 2014-04-28: qty 3

## 2014-04-28 MED ORDER — SODIUM CHLORIDE 0.9 % IV SOLN
Freq: Once | INTRAVENOUS | Status: AC
  Administered 2014-04-28: 250 mL via INTRAVENOUS

## 2014-04-28 MED ORDER — IOHEXOL 300 MG/ML  SOLN
80.0000 mL | Freq: Once | INTRAMUSCULAR | Status: AC | PRN
  Administered 2014-04-28: 80 mL via INTRAVENOUS

## 2014-04-28 NOTE — Progress Notes (Signed)
  Echocardiogram 2D Echocardiogram has been performed.  Lysle Rubens 04/28/2014, 4:33 PM

## 2014-04-28 NOTE — Progress Notes (Addendum)
ANTICOAGULATION CONSULT NOTE - Follow Up Consult  Pharmacy Consult for Heparin Indication: atrial fibrillation  No Known Allergies  Patient Measurements: Height: 5\' 5"  (165.1 cm) Weight: 119 lb 4.3 oz (54.1 kg) IBW/kg (Calculated) : 57 Heparin Dosing Weight: 54kg  Vital Signs: Temp: 97.9 F (36.6 C) (02/14 0445) Temp Source: Oral (02/14 0445) BP: 146/54 mmHg (02/14 0445) Pulse Rate: 95 (02/14 0445)  Labs:  Recent Labs  04/27/14 1632 04/27/14 2120 04/28/14 0349  HGB 7.5* 7.0* 6.6*  HCT 28.4* 26.6* 25.3*  PLT 118* 124* PENDING  APTT  --   --  61*  HEPARINUNFRC  --   --  0.98*  CREATININE 0.88  --   --   TROPONINI  --  <0.03  --     Estimated Creatinine Clearance: 49.4 mL/min (by C-G formula based on Cr of 0.88).   Medications:  Heparin @ 750 units/hr  Assessment: 73yof on xarelto pta for afib, transitioned to IV heparin yesterday pending GI workup for anemia. Last xarelto dose was 2/12. Initial heparin level is elevated as expected. Initial aPTT is slightly below goal. Will continue to use aPTTs to monitor heparin until heparin levels begin to correlate.  Goal of Therapy:  APTT 66-102 seconds Heparin level 0.3-0.7 units/ml Monitor platelets by anticoagulation protocol: Yes   Plan:  1) Increase heparin to 900 units/hr 2) Check aPTT in 8 hours  Deboraha Sprang 04/28/2014,5:30 AM   Addendum   aPTT 50 - subtherapeutic Increase rate to 1000 units/hr Check aPTT at [place], PharmD, BCPS Clinical Pharmacist Pager: [phone] 04/28/2014 3:53 PM

## 2014-04-28 NOTE — Progress Notes (Signed)
[facility] PROGRESS NOTE  [name] [name] ZDG:[id number] DOB: 1940/07/14 DOA: 04/27/2014 PCP: Leonides Sake, MD   Brief narrative:   [name] is a 74yo woman with PMH of COPD, pAF, chronic anemia, HLD who presented to the ED with SOB and dizziness with pre-syncope.  She denies any obvious signs/symptoms of blood loss.  In the ED, she was found to have a Hgb of 6.6 and thrombocytopenia. FOBT was negative.  Patient has presented with a similar picture and at that time it was related to hematuria and loss through the urine.  CXR showed a possible density in the chest.  CT chest did not show this area, but did show pyelonephritis.    Assessment/Plan:    Symptomatic Microcytic anemia with hematuria - SOB likely related to symptomatic anemia - Blood transfusion X 2 units today with good response - Urinalysis revealed hematuria, this is likely the source of blood loss - Recheck CBC q 6 hours overnight - Recheck UA in the AM - Antibiotics will be changed based on previous urine cultures or empirically for UTI pathogens - FOBT negative - LDH normal, bilirubin normal, hemolytic anemia less likely  UTI and Acute pyelonephritis, likely with hemorrhagic cystits -Antibiotics will be changed based on previous urine cultures or empirically for UTI pathogens - Stop azithromycin, continue rocephin - Unclear if she ever followed up with Urology (no notes in system) - Urology follow up outpatient    Thrombocytopenia - If continues to drop, consider changing Abx as rocephin can cause low platelets - She is also on heparin drip, if continues to drop, stop heparin    Paroxysmal atrial fibrillation - Holding xarelto and on heparin - If continues to have blood loss, consider holding heparin acutely until improved.  - continue digoxin, cardizem  CAD (coronary artery disease) - Continue statin, lasix, metoprolol    Hypertension - Continue cardizem, metoprolol, lasix  COPD with SOB -  Continue breathing treatments as ordered outpatient - PRN nebs - TTE done with normal Systolic paramaters but moderate LVH, likely related to lung disease.   - CT chest without mass or consolidation.    Code Status: Full.  Family Communication:  plan of care discussed with the patient Disposition Plan: Home when stable.   IV access:  Peripheral IV  Procedures and diagnostic studies:    Dg Chest 2 View  04/27/2014   CLINICAL DATA:  Per ED note: Pt arrives via [facility] from home. Pt has c/o SOB that was unrelieved by 2 albuterol tx this morning, and abdominal pain accompanied by diarrhea x 2 days. EMS reports diminished lung sounds on the right side and O2 sats were 86% on RA upon arrival.  EXAM: CHEST  2 VIEW  COMPARISON:  12/28/2013  FINDINGS: The heart is mildly enlarged. There is perihilar peribronchial thickening. Lung sutures are identified at the right lung apex. There is increasing opacity at the left lung apex raising the question of infiltrate or mass is. Further evaluation with CT of the chest is recommended.  IMPRESSION: 1. Bronchitic changes. 2. Developing density in the left lung apex warrants further evaluation. CT of the chest with contrast is recommended.   Electronically Signed   By: Nolon Nations M.D.   On: 04/27/2014 17:04   Ct Chest W Contrast  04/28/2014   CLINICAL DATA:  Question of nodular density on chest radiograph at the left lung apex. Further evaluation requested. Initial encounter.  EXAM: CT CHEST WITH CONTRAST  TECHNIQUE: Multidetector CT imaging of  the chest was performed during intravenous contrast administration.  CONTRAST:  96mL OMNIPAQUE IOHEXOL 300 MG/ML  SOLN  COMPARISON:  Chest radiograph from 04/27/2014  FINDINGS: No pulmonary nodule is seen to correspond to the suspected density on radiograph. Scattered nodularity is noted in the periphery of both lungs, thought to reflect scarring, with scarring seen in both lung apices. Bilateral emphysema is seen, most  prominent at the upper lung lobes. No acute focal consolidation, pleural effusion or pneumothorax is identified.  Diffuse coronary artery calcifications are seen. The mediastinum is otherwise unremarkable. No mediastinal lymphadenopathy is seen. No pericardial effusion identified. Calcification is seen along the aortic arch and proximal subclavian arteries bilaterally. The visualized portions of the thyroid gland are unremarkable. No axillary lymphadenopathy is seen.  The patient is status post cholecystectomy. There is underlying chronic prominence of the biliary tree, with diffuse prominence of the intrahepatic biliary ducts. The visualized portions of the spleen are grossly unremarkable.  A 2.8 cm focus of vaguely decreased attenuation is noted at the left kidney, with mild overlying soft tissue stranding. This is thought reflect pyelonephritis. Diffuse calcification is seen along the proximal abdominal aorta and its branches, including at the proximal renal arteries and along the superior mesenteric artery.  No acute osseous abnormalities are seen.  IMPRESSION: 1. 2.8 cm focus of may be decreased attenuation of the left kidney, with mild overlying soft tissue stranding. This is thought reflect acute pyelonephritis. This appears to be confirmed on evaluation of recent urinalysis results. Would consider follow-up renal ultrasound after completion of treatment, to ensure that the left kidney is otherwise unremarkable. 2. No pulmonary nodule seen to correspond to the suspected density on radiograph. Scattered nodularity in the periphery of both lungs is thought reflects scarring, with scarring at the lung apices. 3. Bilateral emphysema, most prominent at the upper lung lobes. 4. Diffuse coronary artery calcifications seen. 5. Calcification noted along the aortic arch and proximal subclavian arteries bilaterally. 6. Diffuse calcification along the proximal abdominal aorta and its branches, including at the proximal  renal arteries and along the superior mesenteric artery.  These results were called by telephone at the time of interpretation on 04/28/2014 at 2:48 am to [facility] on [facility], who verbally acknowledged these results.   Electronically Signed   By: Garald Balding M.D.   On: 04/28/2014 02:48    Medical Consultants:    Other Consultants:    Anti-Infectives:   Azithromycin 2/13 --> 2/14 Rocephin 2/13 --> current  Gilles Chiquito, MD  [place] Pager [phone]  If 7PM-7AM, please contact night-coverage [email or web address] Password [facility] 04/28/2014, 5:50 PM   LOS: 1 day   HPI/Subjective: No events overnight.  She is doing well this morning and tolerated blood transfusion well.   Objective: Filed Vitals:   04/28/14 0817 04/28/14 0849 04/28/14 1107 04/28/14 1434  BP: 146/59  164/59 151/79  Pulse: 84  93   Temp: 98 F (36.7 C)  97.9 F (36.6 C) 97.7 F (36.5 C)  TempSrc: Oral  Oral Oral  Resp: 18  19 16   Height:      Weight:      SpO2: 98% 97% 97% 97%    Intake/Output Summary (Last 24 hours) at 04/28/14 1750 Last data filed at 04/28/14 1427  Gross per 24 hour  Intake   2408 ml  Output   1275 ml  Net   1133 ml    Exam:   General:  Pt is alert, follows commands appropriately, not in acute distress, thin  and cachectic appearing  Cardiovascular: Irreg Irreg, normal rate, S1/S2, no murmurs  Respiratory: Clear to auscultation bilaterally, no wheezing  Abdomen: Soft, non tender, non distended, bowel sounds present  Extremities: No edema  Neuro: Grossly nonfocal  Data Reviewed: Basic Metabolic Panel:  Recent Labs Lab 04/27/14 1632 04/28/14 0349  NA 141 141  K 3.5 3.5  CL 101 104  CO2 32 34*  GLUCOSE 108* 82  BUN 12 13  CREATININE 0.88 0.70  CALCIUM 8.3* 8.1*   Liver Function Tests:  Recent Labs Lab 04/27/14 1632 04/28/14 0349  AST 17 16  ALT 14 13  ALKPHOS 68 55  BILITOT 0.4 0.5  PROT 7.0 6.0  ALBUMIN 2.9* 2.5*    Recent Labs Lab 04/27/14 1632  LIPASE 21    CBC:  Recent Labs Lab 04/27/14 1632 04/27/14 2120 04/28/14 0349 04/28/14 1700  WBC 8.1 7.2 5.1 7.0  NEUTROABS 6.5  --  3.1  --   HGB 7.5* 7.0* 6.6* 11.2*  HCT 28.4* 26.6* 25.3* 37.9  MCV 65.7* 66.2* 65.7* 69.7*  PLT 118* 124* 101* 90*   Cardiac Enzymes:  Recent Labs Lab 04/27/14 2120 04/28/14 0349 04/28/14 1430  TROPONINI <0.03 0.03 0.03     Recent Results (from the past 240 hour(s))  MRSA PCR Screening     Status: None   Collection Time: 04/27/14  9:05 PM  Result Value Ref Range Status   MRSA by PCR NEGATIVE NEGATIVE Final    Comment:        The GeneXpert MRSA Assay (FDA approved for NASAL specimens only), is one component of a comprehensive MRSA colonization surveillance program. It is not intended to diagnose MRSA infection nor to guide or monitor treatment for MRSA infections.      Scheduled Meds: . albuterol  2.5 mg Nebulization Q6H  . atorvastatin  40 mg Oral Daily  . azithromycin  500 mg Intravenous Q24H  . budesonide-formoterol  2 puff Inhalation BID  . cefTRIAXone (ROCEPHIN)  IV  1 g Intravenous Q24H  . digoxin  0.125 mg Oral QHS  . diltiazem  240 mg Oral Daily  . furosemide  40 mg Oral Daily  . iron polysaccharides  150 mg Oral Daily  . metoprolol succinate  12.5 mg Oral Daily  . omega-3 acid ethyl esters  1 g Oral Daily  . oxybutynin  10 mg Oral QHS  . pantoprazole  40 mg Oral Daily  . sodium chloride  3 mL Intravenous Q12H  . tiotropium  18 mcg Inhalation BID  . vitamin B-12  1,000 mcg Oral Daily   Continuous Infusions: . sodium chloride 50 mL/hr at 04/28/14 0700  . heparin 1,000 Units/hr (04/28/14 1631)

## 2014-04-28 NOTE — Progress Notes (Signed)
Dr. Angelyn Punt from radiologist called about the result of Ct scan of the chest.  Stated that no mass found and shows left pyelonephritis. [name] [name] made aware of the result relayed by the radiologist.

## 2014-04-28 NOTE — Progress Notes (Signed)
Utilization Review Completed.Kamirah Shugrue T2/14/2016  

## 2014-04-29 DIAGNOSIS — N10 Acute tubulo-interstitial nephritis: Secondary | ICD-10-CM

## 2014-04-29 DIAGNOSIS — R319 Hematuria, unspecified: Secondary | ICD-10-CM

## 2014-04-29 DIAGNOSIS — D649 Anemia, unspecified: Secondary | ICD-10-CM

## 2014-04-29 DIAGNOSIS — D696 Thrombocytopenia, unspecified: Secondary | ICD-10-CM

## 2014-04-29 LAB — CBC
HCT: 36.6 % (ref 36.0–46.0)
Hemoglobin: 10.6 g/dL — ABNORMAL LOW (ref 12.0–15.0)
MCH: 20.5 pg — ABNORMAL LOW (ref 26.0–34.0)
MCHC: 29 g/dL — ABNORMAL LOW (ref 30.0–36.0)
MCV: 70.9 fL — ABNORMAL LOW (ref 78.0–100.0)
Platelets: 104 10*3/uL — ABNORMAL LOW (ref 150–400)
RBC: 5.16 MIL/uL — AB (ref 3.87–5.11)
RDW: 23.3 % — ABNORMAL HIGH (ref 11.5–15.5)
WBC: 5.9 10*3/uL (ref 4.0–10.5)

## 2014-04-29 LAB — URINE CULTURE
Colony Count: NO GROWTH
Culture: NO GROWTH

## 2014-04-29 LAB — TYPE AND SCREEN
ABO/RH(D): B NEG
Antibody Screen: POSITIVE
DAT, IgG: NEGATIVE
DONOR AG TYPE: NEGATIVE
Donor AG Type: NEGATIVE
Unit division: 0
Unit division: 0

## 2014-04-29 LAB — HEMOGLOBIN AND HEMATOCRIT, BLOOD
HEMATOCRIT: 34.7 % — AB (ref 36.0–46.0)
Hemoglobin: 9.9 g/dL — ABNORMAL LOW (ref 12.0–15.0)

## 2014-04-29 LAB — APTT
aPTT: 61 seconds — ABNORMAL HIGH (ref 24–37)
aPTT: 86 seconds — ABNORMAL HIGH (ref 24–37)

## 2014-04-29 LAB — CLOSTRIDIUM DIFFICILE BY PCR: Toxigenic C. Difficile by PCR: NEGATIVE

## 2014-04-29 LAB — HEPARIN LEVEL (UNFRACTIONATED): Heparin Unfractionated: 0.48 IU/mL (ref 0.30–0.70)

## 2014-04-29 MED ORDER — CIPROFLOXACIN HCL 500 MG PO TABS: 500.0000 mg | ORAL_TABLET | Freq: Two times a day (BID) | ORAL | Status: AC

## 2014-04-29 MED ORDER — RIVAROXABAN 20 MG PO TABS
20.0000 mg | ORAL_TABLET | Freq: Every day | ORAL | Status: DC
  Administered 2014-04-29 – 2014-04-30 (×2): 20 mg via ORAL
  Filled 2014-04-29 (×3): qty 1

## 2014-04-29 MED ORDER — POLYSACCHARIDE IRON COMPLEX 150 MG PO CAPS: 150.0000 mg | ORAL_CAPSULE | Freq: Every day | ORAL | Status: DC

## 2014-04-29 NOTE — Discharge Summary (Signed)
Discharge Summary  [name] [name] XLK:[id number] DOB: 02-Aug-1940  PCP: Leonides Sake, MD  Admit date: 04/27/2014 Anticipated Discharge date: 04/30/2014  Time spent: 25 minutes  Recommendations for Outpatient Follow-up:  1. New medication: Cipro 500 mg by mouth twice a day 7 more days   Discharge Diagnoses:  Active Hospital Problems   Diagnosis Date Noted  . Acute pyelonephritis 04/28/2014  . COLD (chronic obstructive lung disease)   . Essential hypertension   . SOB (shortness of breath) 04/27/2014  . Microcytic anemia 04/27/2014  . Paroxysmal atrial fibrillation 02/03/2012  . Thrombocytopenia 02/02/2012  . UTI (lower urinary tract infection) 02/02/2012  . Hypertension 01/29/2012  . CAD (coronary artery disease) 01/29/2012  . CKD (chronic kidney disease) 01/29/2012    Resolved Hospital Problems   Diagnosis Date Noted Date Resolved  No resolved problems to display.    Discharge Condition: Improved, being discharged home  Diet recommendation: Heart healthy, carb modified  Filed Weights   04/27/14 2015  Weight: 54.1 kg (119 lb 4.3 oz)    History of present illness:  [age] female past oral history of COPD, atrial fibrillation on chronic Xarelto presented to emergency room on 2/13 complaining of lightheadedness. That time she saw him a hemoglobin of 6.6 and thrombocytopenia. Hemoccult was negative. Chest x-ray noted a question of a density in her chest, however CT scan of the chest was unremarkable but did comment on pyelonephritis.  Hospital Course:  Active Problems:   CAD (coronary artery disease)  Essential Hypertension: Antihypertensives initially held secondary to blood loss, improved   CKD (chronic kidney disease): Stable at baseline    Thrombocytopenia: Slightly low, but no evidence of severe drop which would cause bleeding. Tolerating heparin drip, and platelet is improved by 2/15 passed 100   Paroxysmal atrial fibrillation: Xarelto resumed. See below.   SOB  (shortness of breath): Felt to be secondary to symptomatic anemia. 5 transfusion, is improved    Symptomatic Microcytic anemia felt to be secondary to hemorrhagic cystitis/acute pyelonephritis: 2 units packed red blood cells transfused. Xarelto initially held and patient put on IV heparin. With hemoglobin increasing by following day, heparin turned off and Xarelto resumed. Patient initially put on IV Rocephin, discharged on by mouth Cipro given previous history of Klebsiella pansensitive. Outpatient urology follow-up.    COLD (chronic obstructive lung disease): Stable     Procedures:  Status post 2 units packed red blood cells transfused  Consultations:  None  Discharge Exam: BP 131/48 mmHg  Pulse 70  Temp(Src) 98.6 F (37 C) (Oral)  Resp 15  Ht 5\' 5"  (1.651 m)  Wt 54.1 kg (119 lb 4.3 oz)  BMI 19.85 kg/m2  SpO2 98%  General: Alert and oriented 3, no acute distress Cardiovascular: Regular rate and rhythm, D6-U4, 2/6 systolic ejection murmur Respiratory: Decreased breath sounds throughout  Discharge Instructions You were cared for by a hospitalist during your hospital stay. If you have any questions about your discharge medications or the care you received while you were in the hospital after you are discharged, you can call the unit and asked to speak with the hospitalist on call if the hospitalist that took care of you is not available. Once you are discharged, your primary care physician will handle any further medical issues. Please note that NO REFILLS for any discharge medications will be authorized once you are discharged, as it is imperative that you return to your primary care physician (or establish a relationship with a primary care physician if you  do not have one) for your aftercare needs so that they can reassess your need for medications and monitor your lab values.  Discharge Instructions    Diet - low sodium heart healthy    Complete by:  As directed      Increase  activity slowly    Complete by:  As directed             Medication List    TAKE these medications        albuterol (2.5 MG/3ML) 0.083% nebulizer solution  Commonly known as:  PROVENTIL  Take 2.5 mg by nebulization every 6 (six) hours.     atorvastatin 40 MG tablet  Commonly known as:  LIPITOR  Take 40 mg by mouth daily.     budesonide-formoterol 160-4.5 MCG/ACT inhaler  Commonly known as:  SYMBICORT  Inhale 2 puffs into the lungs 2 (two) times daily.     ciprofloxacin 500 MG tablet  Commonly known as:  CIPRO  Take 1 tablet (500 mg total) by mouth 2 (two) times daily.     digoxin 0.25 MG tablet  Commonly known as:  LANOXIN  Take 0.125 mg by mouth daily.     diltiazem 240 MG 24 hr capsule  Commonly known as:  CARDIZEM CD  Take 240 mg by mouth daily.     docusate sodium 100 MG capsule  Commonly known as:  COLACE  Take 100 mg by mouth daily as needed (constipation).     Fish Oil 1200 MG Caps  Take 1,200 mg by mouth daily.     fluticasone 50 MCG/ACT nasal spray  Commonly known as:  FLONASE  Place 1 spray into both nostrils daily as needed for allergies or rhinitis.     furosemide 40 MG tablet  Commonly known as:  LASIX  Take 40 mg by mouth daily.     iron polysaccharides 150 MG capsule  Commonly known as:  NIFEREX  Take 1 capsule (150 mg total) by mouth daily.     loperamide 2 MG capsule  Commonly known as:  IMODIUM  Take 2 mg by mouth 2 (two) times daily as needed for diarrhea or loose stools.     metoprolol succinate 50 MG 24 hr tablet  Commonly known as:  TOPROL-XL  Take 12.5 mg by mouth daily. Take with or immediately following a meal.     OVER THE COUNTER MEDICATION  Take 1 tablet by mouth at bedtime. Hyland's Leg Cramps PM for sleep and leg cramps     oxybutynin 5 MG 24 hr tablet  Commonly known as:  DITROPAN-XL  Take 10 mg by mouth at bedtime.     pantoprazole 40 MG tablet  Commonly known as:  PROTONIX  Take 40 mg by mouth daily.      rivaroxaban 20 MG Tabs tablet  Commonly known as:  XARELTO  Take 20 mg by mouth daily with supper.     tiotropium 18 MCG inhalation capsule  Commonly known as:  SPIRIVA  Place 1 capsule (18 mcg total) into inhaler and inhale daily.     traMADol 50 MG tablet  Commonly known as:  ULTRAM  Take 50-100 mg by mouth 2 (two) times daily as needed (pain).     vitamin B-12 500 MCG tablet  Commonly known as:  CYANOCOBALAMIN  Take 1,000 mcg by mouth daily.       No Known Allergies    The results of significant diagnostics from this hospitalization (including imaging, microbiology, ancillary and  laboratory) are listed below for reference.    Significant Diagnostic Studies: Dg Chest 2 View  04/27/2014   CLINICAL DATA:  Per ED note: Pt arrives via [facility] from home. Pt has c/o SOB that was unrelieved by 2 albuterol tx this morning, and abdominal pain accompanied by diarrhea x 2 days. EMS reports diminished lung sounds on the right side and O2 sats were 86% on RA upon arrival.  EXAM: CHEST  2 VIEW  COMPARISON:  12/28/2013  FINDINGS: The heart is mildly enlarged. There is perihilar peribronchial thickening. Lung sutures are identified at the right lung apex. There is increasing opacity at the left lung apex raising the question of infiltrate or mass is. Further evaluation with CT of the chest is recommended.  IMPRESSION: 1. Bronchitic changes. 2. Developing density in the left lung apex warrants further evaluation. CT of the chest with contrast is recommended.   Electronically Signed   By: Nolon Nations M.D.   On: 04/27/2014 17:04   Ct Chest W Contrast  04/28/2014   .  IMPRESSION: 1. 2.8 cm focus of may be decreased attenuation of the left kidney, with mild overlying soft tissue stranding. This is thought reflect acute pyelonephritis. This appears to be confirmed on evaluation of recent urinalysis results. Would consider follow-up renal ultrasound after completion of treatment, to ensure that the left  kidney is otherwise unremarkable. 2. No pulmonary nodule seen to correspond to the suspected density on radiograph. Scattered nodularity in the periphery of both lungs is thought reflects scarring, with scarring at the lung apices. 3. Bilateral emphysema, most prominent at the upper lung lobes. 4. Diffuse coronary artery calcifications seen. 5. Calcification noted along the aortic arch and proximal subclavian arteries bilaterally. 6. Diffuse calcification along the proximal abdominal aorta and its branches, including at the proximal renal arteries and along the superior mesenteric artery.  These results were called by telephone at the time of interpretation on 04/28/2014 at 2:48 am to [facility] on [facility], who verbally acknowledged these results.   Electronically Signed   By: Garald Balding M.D.   On: 04/28/2014 02:48    Microbiology: Recent Results (from the past 240 hour(s))  Blood culture (routine x 2)     Status: None (Preliminary result)   Collection Time: 04/27/14  5:35 PM  Result Value Ref Range Status   Specimen Description BLOOD RIGHT WRIST  Final   Special Requests BOTTLES DRAWN AEROBIC AND ANAEROBIC 5 CC  Final   Culture   Final           BLOOD CULTURE RECEIVED NO GROWTH TO DATE CULTURE WILL BE HELD FOR 5 DAYS BEFORE ISSUING A FINAL NEGATIVE REPORT Performed at [facility]    Report Status PENDING  Incomplete  Blood culture (routine x 2)     Status: None (Preliminary result)   Collection Time: 04/27/14  5:39 PM  Result Value Ref Range Status   Specimen Description BLOOD LEFT ARM  Final   Special Requests BOTTLES DRAWN AEROBIC AND ANAEROBIC 5 CC  Final   Culture   Final           BLOOD CULTURE RECEIVED NO GROWTH TO DATE CULTURE WILL BE HELD FOR 5 DAYS BEFORE ISSUING A FINAL NEGATIVE REPORT Performed at [facility]    Report Status PENDING  Incomplete  MRSA PCR Screening     Status: None   Collection Time: 04/27/14  9:05 PM  Result Value Ref Range Status   MRSA by  PCR NEGATIVE NEGATIVE Final    Comment:        The GeneXpert MRSA Assay (FDA approved for NASAL specimens only), is one component of a comprehensive MRSA colonization surveillance program. It is not intended to diagnose MRSA infection nor to guide or monitor treatment for MRSA infections.   Clostridium Difficile by PCR     Status: None   Collection Time: 04/28/14  8:35 PM  Result Value Ref Range Status   C difficile by pcr NEGATIVE NEGATIVE Final  Stool culture     Status: None (Preliminary result)   Collection Time: 04/28/14  8:35 PM  Result Value Ref Range Status   Specimen Description STOOL  Final   Special Requests NONE  Final   Culture   Final    Culture reincubated for better growth Performed at [facility]    Report Status PENDING  Incomplete     Labs: Basic Metabolic Panel:  Recent Labs Lab 04/27/14 1632 04/28/14 0349  NA 141 141  K 3.5 3.5  CL 101 104  CO2 32 34*  GLUCOSE 108* 82  BUN 12 13  CREATININE 0.88 0.70  CALCIUM 8.3* 8.1*   Liver Function Tests:  Recent Labs Lab 04/27/14 1632 04/28/14 0349  AST 17 16  ALT 14 13  ALKPHOS 68 55  BILITOT 0.4 0.5  PROT 7.0 6.0  ALBUMIN 2.9* 2.5*    Recent Labs Lab 04/27/14 1632  LIPASE 21   No results for input(s): AMMONIA in the last 168 hours. CBC:  Recent Labs Lab 04/27/14 1632 04/27/14 2120 04/28/14 0349 04/28/14 1700 04/29/14 0004 04/29/14 0620  WBC 8.1 7.2 5.1 7.0  --  5.9  NEUTROABS 6.5  --  3.1  --   --   --   HGB 7.5* 7.0* 6.6* 11.2* 9.9* 10.6*  HCT 28.4* 26.6* 25.3* 37.9 34.7* 36.6  MCV 65.7* 66.2* 65.7* 69.7*  --  70.9*  PLT 118* 124* 101* 90*  --  104*   Cardiac Enzymes:  Recent Labs Lab 04/27/14 2120 04/28/14 0349 04/28/14 1430  TROPONINI <0.03 0.03 0.03   BNP: BNP (last 3 results)  Recent Labs  04/27/14 2120  BNP 109.9*    CBG: No results for input(s): GLUCAP in the last 168 hours.     Signed:  Annita Brod  [facility] 04/29/2014, 3:49 PM

## 2014-04-29 NOTE — Progress Notes (Signed)
ANTICOAGULATION CONSULT NOTE - Follow Up Consult  Pharmacy Consult for Heparin Indication: atrial fibrillation  No Known Allergies  Patient Measurements: Height: 5\' 5"  (165.1 cm) Weight: 119 lb 4.3 oz (54.1 kg) IBW/kg (Calculated) : 57 Heparin Dosing Weight: 54kg  Vital Signs: Temp: 98.1 F (36.7 C) (02/15 0632) Temp Source: Oral (02/15 0962) BP: 160/59 mmHg (02/15 0632) Pulse Rate: 86 (02/15 0632)  Labs:  Recent Labs  04/27/14 1632 04/27/14 2120  04/28/14 0349 04/28/14 1355 04/28/14 1430 04/28/14 1700 04/29/14 0004 04/29/14 0620  HGB 7.5* 7.0*  --  6.6*  --   --  11.2* 9.9* 10.6*  HCT 28.4* 26.6*  --  25.3*  --   --  37.9 34.7* 36.6  PLT 118* 124*  --  101*  --   --  90*  --  104*  APTT  --   --   < > 61* 50*  --   --  86* 61*  HEPARINUNFRC  --   --   --  0.98*  --   --   --   --  0.48  CREATININE 0.88  --   --  0.70  --   --   --   --   --   TROPONINI  --  <0.03  --  0.03  --  0.03  --   --   --   < > = values in this interval not displayed.  Estimated Creatinine Clearance: 54.3 mL/min (by C-G formula based on Cr of 0.7).   Medications:  Heparin @ 1000 units/hr  Assessment: 73yof continues on heparin for afib while xarelto on hold pending GI workup for anemia. APTT is down to 61 seconds from 86 seconds (slightly below goal) and HL 0.48 (therapeutic) on 1000 units/hr.  Hg 6.6>>10.6 after blood transfusion yesterday.  PLTC remains low at 104.  Pt admitted with symptomatic microcytic anemia with hematuria - likely hemorrhagic cystitis.  Home dose of xarelto: 20 mq qday  Goal of Therapy:  APTT 66-102 seconds Heparin level 0.3-0.7 units/ml Monitor platelets by anticoagulation protocol: Yes   Plan:  - Continue heparin at 1000 units/hr - Follow up daily heparin level and aPTT  - f/u to resume home [place], Pharm.D. [phone] 04/29/2014 11:43 AM

## 2014-04-29 NOTE — Progress Notes (Signed)
Medicare Important Message given?  YES (If response is "NO", the following Medicare IM given date fields will be blank) Date Medicare IM given:  04/29/14 Medicare IM given by:  Matie Dimaano 

## 2014-04-29 NOTE — Progress Notes (Signed)
ANTICOAGULATION CONSULT NOTE - Follow Up Consult  Pharmacy Consult for Heparin Indication: atrial fibrillation  No Known Allergies  Patient Measurements: Height: 5\' 5"  (165.1 cm) Weight: 119 lb 4.3 oz (54.1 kg) IBW/kg (Calculated) : 57 Heparin Dosing Weight: 54kg  Vital Signs: Temp: 98.6 F (37 C) (02/14 2145) Temp Source: Oral (02/14 2145) BP: 157/61 mmHg (02/14 2145) Pulse Rate: 82 (02/15 0025)  Labs:  Recent Labs  04/27/14 1632 04/27/14 2120 04/28/14 0349 04/28/14 1355 04/28/14 1430 04/28/14 1700 04/29/14 0004  HGB 7.5* 7.0* 6.6*  --   --  11.2* 9.9*  HCT 28.4* 26.6* 25.3*  --   --  37.9 34.7*  PLT 118* 124* 101*  --   --  90*  --   APTT  --   --  61* 50*  --   --  86*  HEPARINUNFRC  --   --  0.98*  --   --   --   --   CREATININE 0.88  --  0.70  --   --   --   --   TROPONINI  --  <0.03 0.03  --  0.03  --   --     Estimated Creatinine Clearance: 54.3 mL/min (by C-G formula based on Cr of 0.7).   Medications:  Heparin @ 1000 units/hr  Assessment: 73yof continues on heparin for afib while xarelto on hold pending GI workup for anemia. APTT is now therapeutic after rate increase this morning. Will check heparin level with AM labs to see if they are starting to correlate.   Goal of Therapy:  APTT 66-102 seconds Heparin level 0.3-0.7 units/ml Monitor platelets by anticoagulation protocol: Yes   Plan:  1) Continue heparin at 1000 units/hr 2) Follow up heparin level and aPTT in AM  Deboraha Sprang 04/29/2014,12:26 AM

## 2014-04-30 DIAGNOSIS — N390 Urinary tract infection, site not specified: Secondary | ICD-10-CM

## 2014-04-30 LAB — CBC
HCT: 37 % (ref 36.0–46.0)
Hemoglobin: 10.8 g/dL — ABNORMAL LOW (ref 12.0–15.0)
MCH: 20.6 pg — ABNORMAL LOW (ref 26.0–34.0)
MCHC: 29.2 g/dL — ABNORMAL LOW (ref 30.0–36.0)
MCV: 70.6 fL — ABNORMAL LOW (ref 78.0–100.0)
PLATELETS: 107 10*3/uL — AB (ref 150–400)
RBC: 5.24 MIL/uL — ABNORMAL HIGH (ref 3.87–5.11)
RDW: 24.5 % — AB (ref 11.5–15.5)
WBC: 8.4 10*3/uL (ref 4.0–10.5)

## 2014-04-30 LAB — APTT: aPTT: 50 seconds — ABNORMAL HIGH (ref 24–37)

## 2014-04-30 LAB — HEPARIN LEVEL (UNFRACTIONATED)

## 2014-04-30 NOTE — Care Management Note (Signed)
    Page 1 of 1   05/02/2014     5:10:30 PM CARE MANAGEMENT NOTE 05/02/2014  Patient:  [name] [name], [name] [name]   Account Number:  [id number]  Date Initiated:  04/29/2014  Documentation initiated by:  Tomi Bamberger  Subjective/Objective Assessment:   dx res distress  [facility]     Action/Plan:   Anticipated DC Date:  05/01/2014   Anticipated DC Plan:  [place]  CM consult      Choice offered to / List presented to:             Status of service:  Completed, signed off Medicare Important Message given?  YES (If response is "NO", the following Medicare IM given date fields will be blank) Date Medicare IM given:  04/29/2014 Medicare IM given by:  Tomi Bamberger Date Additional Medicare IM given:   Additional Medicare IM given by:    Discharge Disposition:  HOME/SELF CARE  Per UR Regulation:  Reviewed for med. necessity/level of care/duration of stay  If discussed at [place] of Stay Meetings, dates discussed:    Comments:  04/30/14 [place] ,BSN [phone] NCM will cont to follow for dc needs.

## 2014-04-30 NOTE — Consult Note (Signed)
Met with the patient (hard of hearing) at bedside to offer [facility] services as benefit of [employer].  Patient's daughter-in-law, Oran Rein, was at bedside as well.  Patient agreeable to services and will receive post hospital discharge call and will be evaluated for monthly home visits.Consent form signed and folder with [place] [facility] information given.   Of note, [facility] Care Management services does not replace or interfere with any services that are arranged by inpatient case management or social work. For additional questions or referrals please contact, Natividad Brood, RN BSN CCM, [facility] Liaison at [phone].

## 2014-04-30 NOTE — Progress Notes (Signed)
HO Maryland Pink paged patient d/c ride is here, asked to complete d/c reconciliation and print prescriptions.

## 2014-04-30 NOTE — Progress Notes (Signed)
NURSING PROGRESS NOTE  [name] [name] [id number] Discharge Data: 04/30/2014 5:21 PM Attending Provider: Annita Brod, MD BVA:POLIDCV,UDTHY Carlean Jews, MD   Claire Shown to be D/C'd Home per MD order.    All IV's will be discontinued and monitored for bleeding.  All belongings will be returned to patient for patient to take home.  Spoke with MD Maryland Pink via phone all prescriptions sent to patient specific pharmacy.  Last Documented Vital Signs:  Blood pressure 138/62, pulse 69, temperature 98.1 F (36.7 C), temperature source Oral, resp. rate 20, height 5\' 5"  (1.651 m), weight 54.1 kg (119 lb 4.3 oz), SpO2 96 %.  Hendricks Limes RN, BS, BSN

## 2014-04-30 NOTE — Progress Notes (Signed)
Patient seen today. Doing okay. No complaints. Unable to be discharged yesterday due to inclement weather. Family able to come today. Patient given references for new primary care physicians at her request.

## 2014-04-30 NOTE — Progress Notes (Signed)
Pt states her ride for discharge will be here sometime this late afternoon.

## 2014-04-30 NOTE — Progress Notes (Signed)
Zelda in lab called, pt Heparin study lab value high, Zelda said she would re-order the heparin study as it looked contaminated.

## 2014-05-02 LAB — STOOL CULTURE

## 2014-05-04 LAB — CULTURE, BLOOD (ROUTINE X 2)
Culture: NO GROWTH
Culture: NO GROWTH

## 2014-05-13 DIAGNOSIS — M25552 Pain in left hip: Secondary | ICD-10-CM | POA: Diagnosis not present

## 2014-05-13 DIAGNOSIS — J449 Chronic obstructive pulmonary disease, unspecified: Secondary | ICD-10-CM | POA: Diagnosis not present

## 2014-05-13 DIAGNOSIS — D696 Thrombocytopenia, unspecified: Secondary | ICD-10-CM | POA: Diagnosis not present

## 2014-05-13 DIAGNOSIS — D500 Iron deficiency anemia secondary to blood loss (chronic): Secondary | ICD-10-CM | POA: Diagnosis not present

## 2014-05-13 DIAGNOSIS — Z9181 History of falling: Secondary | ICD-10-CM | POA: Diagnosis not present

## 2014-05-13 DIAGNOSIS — N12 Tubulo-interstitial nephritis, not specified as acute or chronic: Secondary | ICD-10-CM | POA: Diagnosis not present

## 2014-05-15 DIAGNOSIS — J449 Chronic obstructive pulmonary disease, unspecified: Secondary | ICD-10-CM | POA: Diagnosis not present

## 2014-05-27 DIAGNOSIS — J449 Chronic obstructive pulmonary disease, unspecified: Secondary | ICD-10-CM | POA: Diagnosis not present

## 2014-05-27 DIAGNOSIS — R319 Hematuria, unspecified: Secondary | ICD-10-CM | POA: Diagnosis not present

## 2014-06-05 ENCOUNTER — Ambulatory Visit: Payer: Medicare Other

## 2014-06-07 ENCOUNTER — Ambulatory Visit: Payer: Medicare Other

## 2014-06-08 ENCOUNTER — Encounter (HOSPITAL_COMMUNITY): Payer: Self-pay | Admitting: Emergency Medicine

## 2014-06-08 ENCOUNTER — Emergency Department (HOSPITAL_COMMUNITY): Payer: Commercial Managed Care - HMO

## 2014-06-08 ENCOUNTER — Inpatient Hospital Stay (HOSPITAL_COMMUNITY)
Admission: EM | Admit: 2014-06-08 | Discharge: 2014-06-13 | DRG: 669 | Disposition: A | Discharge status: Home / Self Care | Payer: Commercial Managed Care - HMO | Attending: Internal Medicine | Admitting: Internal Medicine

## 2014-06-08 DIAGNOSIS — J9610 Chronic respiratory failure, unspecified whether with hypoxia or hypercapnia: Secondary | ICD-10-CM | POA: Diagnosis present

## 2014-06-08 DIAGNOSIS — K219 Gastro-esophageal reflux disease without esophagitis: Secondary | ICD-10-CM | POA: Diagnosis present

## 2014-06-08 DIAGNOSIS — R1084 Generalized abdominal pain: Secondary | ICD-10-CM | POA: Diagnosis not present

## 2014-06-08 DIAGNOSIS — D500 Iron deficiency anemia secondary to blood loss (chronic): Secondary | ICD-10-CM | POA: Diagnosis present

## 2014-06-08 DIAGNOSIS — I480 Paroxysmal atrial fibrillation: Secondary | ICD-10-CM | POA: Diagnosis not present

## 2014-06-08 DIAGNOSIS — Z86718 Personal history of other venous thrombosis and embolism: Secondary | ICD-10-CM

## 2014-06-08 DIAGNOSIS — Z79899 Other long term (current) drug therapy: Secondary | ICD-10-CM | POA: Diagnosis not present

## 2014-06-08 DIAGNOSIS — J42 Unspecified chronic bronchitis: Secondary | ICD-10-CM | POA: Diagnosis not present

## 2014-06-08 DIAGNOSIS — D509 Iron deficiency anemia, unspecified: Secondary | ICD-10-CM | POA: Diagnosis present

## 2014-06-08 DIAGNOSIS — Z87891 Personal history of nicotine dependence: Secondary | ICD-10-CM | POA: Diagnosis not present

## 2014-06-08 DIAGNOSIS — K921 Melena: Secondary | ICD-10-CM | POA: Diagnosis not present

## 2014-06-08 DIAGNOSIS — R531 Weakness: Secondary | ICD-10-CM | POA: Diagnosis not present

## 2014-06-08 DIAGNOSIS — Z9049 Acquired absence of other specified parts of digestive tract: Secondary | ICD-10-CM | POA: Diagnosis not present

## 2014-06-08 DIAGNOSIS — I129 Hypertensive chronic kidney disease with stage 1 through stage 4 chronic kidney disease, or unspecified chronic kidney disease: Secondary | ICD-10-CM | POA: Diagnosis present

## 2014-06-08 DIAGNOSIS — N3289 Other specified disorders of bladder: Secondary | ICD-10-CM | POA: Diagnosis not present

## 2014-06-08 DIAGNOSIS — K838 Other specified diseases of biliary tract: Secondary | ICD-10-CM | POA: Diagnosis not present

## 2014-06-08 DIAGNOSIS — N3020 Other chronic cystitis without hematuria: Secondary | ICD-10-CM | POA: Diagnosis not present

## 2014-06-08 DIAGNOSIS — J45909 Unspecified asthma, uncomplicated: Secondary | ICD-10-CM | POA: Diagnosis present

## 2014-06-08 DIAGNOSIS — J449 Chronic obstructive pulmonary disease, unspecified: Secondary | ICD-10-CM | POA: Diagnosis not present

## 2014-06-08 DIAGNOSIS — R195 Other fecal abnormalities: Secondary | ICD-10-CM

## 2014-06-08 DIAGNOSIS — Z7901 Long term (current) use of anticoagulants: Secondary | ICD-10-CM | POA: Diagnosis not present

## 2014-06-08 DIAGNOSIS — N3001 Acute cystitis with hematuria: Secondary | ICD-10-CM | POA: Diagnosis not present

## 2014-06-08 DIAGNOSIS — Z7951 Long term (current) use of inhaled steroids: Secondary | ICD-10-CM

## 2014-06-08 DIAGNOSIS — R404 Transient alteration of awareness: Secondary | ICD-10-CM | POA: Diagnosis not present

## 2014-06-08 DIAGNOSIS — N189 Chronic kidney disease, unspecified: Secondary | ICD-10-CM | POA: Diagnosis present

## 2014-06-08 DIAGNOSIS — Z9981 Dependence on supplemental oxygen: Secondary | ICD-10-CM

## 2014-06-08 DIAGNOSIS — R310 Gross hematuria: Secondary | ICD-10-CM | POA: Insufficient documentation

## 2014-06-08 DIAGNOSIS — C679 Malignant neoplasm of bladder, unspecified: Secondary | ICD-10-CM | POA: Diagnosis not present

## 2014-06-08 DIAGNOSIS — Z96641 Presence of right artificial hip joint: Secondary | ICD-10-CM | POA: Diagnosis present

## 2014-06-08 DIAGNOSIS — D61818 Other pancytopenia: Secondary | ICD-10-CM | POA: Diagnosis not present

## 2014-06-08 DIAGNOSIS — N281 Cyst of kidney, acquired: Secondary | ICD-10-CM | POA: Diagnosis present

## 2014-06-08 DIAGNOSIS — D696 Thrombocytopenia, unspecified: Secondary | ICD-10-CM | POA: Diagnosis present

## 2014-06-08 DIAGNOSIS — R319 Hematuria, unspecified: Secondary | ICD-10-CM | POA: Diagnosis present

## 2014-06-08 DIAGNOSIS — I700 Atherosclerosis of aorta: Secondary | ICD-10-CM | POA: Diagnosis not present

## 2014-06-08 DIAGNOSIS — M1990 Unspecified osteoarthritis, unspecified site: Secondary | ICD-10-CM | POA: Diagnosis not present

## 2014-06-08 DIAGNOSIS — I2510 Atherosclerotic heart disease of native coronary artery without angina pectoris: Secondary | ICD-10-CM | POA: Diagnosis present

## 2014-06-08 DIAGNOSIS — I252 Old myocardial infarction: Secondary | ICD-10-CM | POA: Diagnosis not present

## 2014-06-08 DIAGNOSIS — D649 Anemia, unspecified: Secondary | ICD-10-CM | POA: Diagnosis not present

## 2014-06-08 DIAGNOSIS — N3090 Cystitis, unspecified without hematuria: Secondary | ICD-10-CM | POA: Diagnosis not present

## 2014-06-08 LAB — URINALYSIS, ROUTINE W REFLEX MICROSCOPIC
Bilirubin Urine: NEGATIVE
GLUCOSE, UA: NEGATIVE mg/dL
Ketones, ur: NEGATIVE mg/dL
NITRITE: NEGATIVE
PROTEIN: 100 mg/dL — AB
Specific Gravity, Urine: 1.006 (ref 1.005–1.030)
Urobilinogen, UA: 0.2 mg/dL (ref 0.0–1.0)
pH: 6.5 (ref 5.0–8.0)

## 2014-06-08 LAB — IRON AND TIBC
IRON: 174 ug/dL — AB (ref 42–145)
Saturation Ratios: 43 % (ref 20–55)
TIBC: 402 ug/dL (ref 250–470)
UIBC: 228 ug/dL (ref 125–400)

## 2014-06-08 LAB — CBC WITH DIFFERENTIAL/PLATELET
BASOS ABS: 0 10*3/uL (ref 0.0–0.1)
BASOS PCT: 1 % (ref 0–1)
Eosinophils Absolute: 0 10*3/uL (ref 0.0–0.7)
Eosinophils Relative: 1 % (ref 0–5)
HCT: 43.4 % (ref 36.0–46.0)
Hemoglobin: 13.1 g/dL (ref 12.0–15.0)
Lymphocytes Relative: 20 % (ref 12–46)
Lymphs Abs: 1.2 10*3/uL (ref 0.7–4.0)
MCH: 23.5 pg — ABNORMAL LOW (ref 26.0–34.0)
MCHC: 30.2 g/dL (ref 30.0–36.0)
MCV: 77.9 fL — ABNORMAL LOW (ref 78.0–100.0)
Monocytes Absolute: 0.8 10*3/uL (ref 0.1–1.0)
Monocytes Relative: 13 % — ABNORMAL HIGH (ref 3–12)
NEUTROS PCT: 67 % (ref 43–77)
Neutro Abs: 4.2 10*3/uL (ref 1.7–7.7)
Platelets: 92 10*3/uL — ABNORMAL LOW (ref 150–400)
RBC: 5.57 MIL/uL — ABNORMAL HIGH (ref 3.87–5.11)
RDW: 26.7 % — AB (ref 11.5–15.5)
WBC: 6.3 10*3/uL (ref 4.0–10.5)

## 2014-06-08 LAB — COMPREHENSIVE METABOLIC PANEL
ALT: 13 U/L (ref 0–35)
ANION GAP: 10 (ref 5–15)
AST: 21 U/L (ref 0–37)
Albumin: 3.7 g/dL (ref 3.5–5.2)
Alkaline Phosphatase: 72 U/L (ref 39–117)
BILIRUBIN TOTAL: 0.6 mg/dL (ref 0.3–1.2)
BUN: 23 mg/dL (ref 6–23)
CHLORIDE: 95 mmol/L — AB (ref 96–112)
CO2: 33 mmol/L — AB (ref 19–32)
CREATININE: 0.79 mg/dL (ref 0.50–1.10)
Calcium: 9.6 mg/dL (ref 8.4–10.5)
GFR calc Af Amer: >90 mL/min (ref >90)
GFR calc non Af Amer: 81 mL/min — ABNORMAL LOW (ref >90)
GLUCOSE: 82 mg/dL (ref 70–99)
POTASSIUM: 4.2 mmol/L (ref 3.5–5.1)
Sodium: 138 mmol/L (ref 135–145)
Total Protein: 8 g/dL (ref 6.0–8.3)

## 2014-06-08 LAB — CBC
HEMATOCRIT: 38.6 % (ref 36.0–46.0)
Hemoglobin: 11 g/dL — ABNORMAL LOW (ref 12.0–15.0)
MCH: 22.6 pg — ABNORMAL LOW (ref 26.0–34.0)
MCHC: 28.5 g/dL — ABNORMAL LOW (ref 30.0–36.0)
MCV: 79.3 fL (ref 78.0–100.0)
PLATELETS: 63 10*3/uL — AB (ref 150–400)
RBC: 4.87 MIL/uL (ref 3.87–5.11)
RDW: 26.8 % — ABNORMAL HIGH (ref 11.5–15.5)
WBC: 4.4 10*3/uL (ref 4.0–10.5)

## 2014-06-08 LAB — VITAMIN B12: Vitamin B-12: >2000 pg/mL — ABNORMAL HIGH (ref 211–911)

## 2014-06-08 LAB — RETICULOCYTES
RBC.: 5.26 MIL/uL — ABNORMAL HIGH (ref 3.87–5.11)
RETIC CT PCT: 0.9 % (ref 0.4–3.1)
Retic Count, Absolute: 47.3 10*3/uL (ref 19.0–186.0)

## 2014-06-08 LAB — POC OCCULT BLOOD, ED: Fecal Occult Bld: POSITIVE — AB

## 2014-06-08 LAB — URINE MICROSCOPIC-ADD ON

## 2014-06-08 LAB — TYPE AND SCREEN
ABO/RH(D): B NEG
ANTIBODY SCREEN: POSITIVE
DAT, IgG: NEGATIVE

## 2014-06-08 LAB — I-STAT CG4 LACTIC ACID, ED: Lactic Acid, Venous: 0.94 mmol/L (ref 0.5–2.0)

## 2014-06-08 LAB — FERRITIN: Ferritin: 17 ng/mL (ref 10–291)

## 2014-06-08 LAB — PROTIME-INR
INR: 1.32 (ref 0.00–1.49)
Prothrombin Time: 16.5 seconds — ABNORMAL HIGH (ref 11.6–15.2)

## 2014-06-08 LAB — FOLATE: Folate: >20 ng/mL

## 2014-06-08 LAB — DIGOXIN LEVEL: Digoxin Level: 1.2 ng/mL (ref 0.8–2.0)

## 2014-06-08 LAB — MRSA PCR SCREENING: MRSA BY PCR: NEGATIVE

## 2014-06-08 MED ORDER — SODIUM CHLORIDE 0.9 % IV BOLUS (SEPSIS)
1000.0000 mL | Freq: Once | INTRAVENOUS | Status: AC
  Administered 2014-06-08: 1000 mL via INTRAVENOUS

## 2014-06-08 MED ORDER — IOHEXOL 300 MG/ML  SOLN
25.0000 mL | Freq: Once | INTRAMUSCULAR | Status: AC | PRN
  Administered 2014-06-08: 25 mL via ORAL

## 2014-06-08 MED ORDER — POLYSACCHARIDE IRON COMPLEX 150 MG PO CAPS
150.0000 mg | ORAL_CAPSULE | Freq: Every day | ORAL | Status: DC
  Administered 2014-06-09 – 2014-06-13 (×5): 150 mg via ORAL
  Filled 2014-06-08 (×5): qty 1

## 2014-06-08 MED ORDER — HYDROCORTISONE ACETATE 25 MG RE SUPP
25.0000 mg | Freq: Two times a day (BID) | RECTAL | Status: DC
  Administered 2014-06-10: 25 mg via RECTAL
  Filled 2014-06-08 (×5): qty 1

## 2014-06-08 MED ORDER — IOHEXOL 300 MG/ML  SOLN
100.0000 mL | Freq: Once | INTRAMUSCULAR | Status: AC | PRN
  Administered 2014-06-08: 100 mL via INTRAVENOUS

## 2014-06-08 MED ORDER — TRAMADOL HCL 50 MG PO TABS
50.0000 mg | ORAL_TABLET | Freq: Two times a day (BID) | ORAL | Status: DC | PRN
  Administered 2014-06-09 (×2): 100 mg via ORAL
  Administered 2014-06-11: 50 mg via ORAL
  Filled 2014-06-08: qty 2
  Filled 2014-06-08: qty 1
  Filled 2014-06-08: qty 2

## 2014-06-08 MED ORDER — TIOTROPIUM BROMIDE MONOHYDRATE 18 MCG IN CAPS
18.0000 ug | ORAL_CAPSULE | Freq: Every day | RESPIRATORY_TRACT | Status: DC
  Administered 2014-06-09 – 2014-06-13 (×3): 18 ug via RESPIRATORY_TRACT
  Filled 2014-06-08 (×2): qty 5

## 2014-06-08 MED ORDER — ATORVASTATIN CALCIUM 40 MG PO TABS
40.0000 mg | ORAL_TABLET | Freq: Every day | ORAL | Status: DC
  Administered 2014-06-09 – 2014-06-13 (×5): 40 mg via ORAL
  Filled 2014-06-08 (×5): qty 1

## 2014-06-08 MED ORDER — CEFTRIAXONE SODIUM IN DEXTROSE 20 MG/ML IV SOLN
1.0000 g | INTRAVENOUS | Status: DC
  Administered 2014-06-08 – 2014-06-12 (×5): 1 g via INTRAVENOUS
  Filled 2014-06-08 (×7): qty 50

## 2014-06-08 MED ORDER — CYANOCOBALAMIN 500 MCG PO TABS
1000.0000 ug | ORAL_TABLET | Freq: Every day | ORAL | Status: DC
  Administered 2014-06-09 – 2014-06-13 (×5): 1000 ug via ORAL
  Filled 2014-06-08 (×5): qty 2

## 2014-06-08 MED ORDER — FUROSEMIDE 40 MG PO TABS
40.0000 mg | ORAL_TABLET | Freq: Every day | ORAL | Status: DC
  Administered 2014-06-09: 40 mg via ORAL
  Filled 2014-06-08 (×2): qty 1

## 2014-06-08 MED ORDER — ONDANSETRON HCL 4 MG/2ML IJ SOLN: 4.0000 mg | Freq: Four times a day (QID) | INTRAMUSCULAR | Status: DC | PRN

## 2014-06-08 MED ORDER — GUAIFENESIN-DM 100-10 MG/5ML PO SYRP
5.0000 mL | ORAL_SOLUTION | ORAL | Status: DC | PRN
Start: 2014-06-08 — End: 2014-06-13

## 2014-06-08 MED ORDER — PANTOPRAZOLE SODIUM 40 MG PO TBEC
40.0000 mg | DELAYED_RELEASE_TABLET | Freq: Every day | ORAL | Status: DC
  Administered 2014-06-09 – 2014-06-13 (×5): 40 mg via ORAL
  Filled 2014-06-08 (×5): qty 1

## 2014-06-08 MED ORDER — BUDESONIDE-FORMOTEROL FUMARATE 160-4.5 MCG/ACT IN AERO
2.0000 | INHALATION_SPRAY | Freq: Two times a day (BID) | RESPIRATORY_TRACT | Status: DC
  Administered 2014-06-08 – 2014-06-13 (×7): 2 via RESPIRATORY_TRACT
  Filled 2014-06-08 (×2): qty 6

## 2014-06-08 MED ORDER — ALBUTEROL SULFATE (2.5 MG/3ML) 0.083% IN NEBU
2.5000 mg | INHALATION_SOLUTION | Freq: Four times a day (QID) | RESPIRATORY_TRACT | Status: DC
  Administered 2014-06-08 – 2014-06-09 (×2): 2.5 mg via RESPIRATORY_TRACT
  Filled 2014-06-08 (×4): qty 3

## 2014-06-08 MED ORDER — DOCUSATE SODIUM 100 MG PO CAPS
100.0000 mg | ORAL_CAPSULE | Freq: Every day | ORAL | Status: DC | PRN
  Administered 2014-06-12: 100 mg via ORAL
  Filled 2014-06-08: qty 1

## 2014-06-08 MED ORDER — ALBUTEROL SULFATE (2.5 MG/3ML) 0.083% IN NEBU
2.5000 mg | INHALATION_SOLUTION | RESPIRATORY_TRACT | Status: DC | PRN
  Administered 2014-06-08: 2.5 mg via RESPIRATORY_TRACT

## 2014-06-08 MED ORDER — DILTIAZEM HCL ER COATED BEADS 240 MG PO CP24
240.0000 mg | ORAL_CAPSULE | Freq: Every day | ORAL | Status: DC
  Administered 2014-06-10 – 2014-06-13 (×4): 240 mg via ORAL
  Filled 2014-06-08 (×5): qty 1

## 2014-06-08 MED ORDER — SODIUM CHLORIDE 0.9 % IJ SOLN
3.0000 mL | Freq: Two times a day (BID) | INTRAMUSCULAR | Status: DC
  Administered 2014-06-08 – 2014-06-12 (×4): 3 mL via INTRAVENOUS

## 2014-06-08 MED ORDER — CETYLPYRIDINIUM CHLORIDE 0.05 % MT LIQD
7.0000 mL | Freq: Two times a day (BID) | OROMUCOSAL | Status: DC
  Administered 2014-06-08 – 2014-06-13 (×9): 7 mL via OROMUCOSAL

## 2014-06-08 MED ORDER — SODIUM CHLORIDE 0.9 % IV SOLN
INTRAVENOUS | Status: DC
  Administered 2014-06-08: 18:00:00 via INTRAVENOUS

## 2014-06-08 MED ORDER — OXYBUTYNIN CHLORIDE ER 10 MG PO TB24
10.0000 mg | ORAL_TABLET | Freq: Every day | ORAL | Status: DC
  Administered 2014-06-08 – 2014-06-12 (×5): 10 mg via ORAL
  Filled 2014-06-08 (×7): qty 1

## 2014-06-08 MED ORDER — METOPROLOL SUCCINATE ER 25 MG PO TB24
12.5000 mg | ORAL_TABLET | Freq: Every day | ORAL | Status: DC
  Administered 2014-06-10 – 2014-06-13 (×4): 12.5 mg via ORAL
  Filled 2014-06-08 (×5): qty 1

## 2014-06-08 MED ORDER — ACETAMINOPHEN 325 MG PO TABS
650.0000 mg | ORAL_TABLET | Freq: Four times a day (QID) | ORAL | Status: DC | PRN
  Administered 2014-06-08 – 2014-06-13 (×8): 650 mg via ORAL
  Filled 2014-06-08 (×8): qty 2

## 2014-06-08 MED ORDER — FLUTICASONE PROPIONATE 50 MCG/ACT NA SUSP: 1.0000 | Freq: Every day | NASAL | Status: DC | PRN

## 2014-06-08 MED ORDER — DIGOXIN 125 MCG PO TABS
0.1250 mg | ORAL_TABLET | Freq: Every day | ORAL | Status: DC
  Administered 2014-06-10 – 2014-06-13 (×4): 0.125 mg via ORAL
  Filled 2014-06-08 (×5): qty 1

## 2014-06-08 MED ORDER — ONDANSETRON HCL 4 MG PO TABS
4.0000 mg | ORAL_TABLET | Freq: Four times a day (QID) | ORAL | Status: DC | PRN
  Administered 2014-06-09: 4 mg via ORAL
  Filled 2014-06-08: qty 1

## 2014-06-08 MED ORDER — ACETAMINOPHEN 650 MG RE SUPP: 650.0000 mg | Freq: Four times a day (QID) | RECTAL | Status: DC | PRN

## 2014-06-08 MED ORDER — SODIUM CHLORIDE 0.9 % IV SOLN
80.0000 mg | Freq: Once | INTRAVENOUS | Status: AC
  Administered 2014-06-08: 80 mg via INTRAVENOUS
  Filled 2014-06-08 (×2): qty 80

## 2014-06-08 MED ORDER — LOPERAMIDE HCL 2 MG PO CAPS: 2.0000 mg | ORAL_CAPSULE | Freq: Two times a day (BID) | ORAL | Status: DC | PRN

## 2014-06-08 MED ORDER — POLYETHYLENE GLYCOL 3350 17 G PO PACK
17.0000 g | PACK | Freq: Every day | ORAL | Status: DC
  Administered 2014-06-08 – 2014-06-13 (×6): 17 g via ORAL
  Filled 2014-06-08 (×6): qty 1

## 2014-06-08 NOTE — ED Provider Notes (Addendum)
CSN: [id number]     Arrival date & time 06/08/14  1117 History   First MD Initiated Contact with Patient 06/08/14 1130     Chief Complaint  Patient presents with  . Abdominal Pain  . Hematochezia  . Hematuria     (Consider location/radiation/quality/duration/timing/severity/associated sxs/prior Treatment) HPI  [name] [name] Is a very pleasant 74 year old female who presents today with chief complaint of gross hematuria, hematochezia, and weakness. 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 The patient is currently taking Xarelto. The patient was admitted last month for hemorrhagic cystitis and acute pyelonephritis. She is found to have symptomatic microcytic anemia. She was transfused 2 units of packed red blood cells. Patient placed on IV Rocephin and given Cipro at discharge. Had a history of pansensitive Klebsiella pneumoniae. Her last urine culture on 04/28/2014 showed no growth. The patient is also complaining of diffuse abdominal pain. She states it is moderate. She denies any constipation. Last bowel movement was yesterday. She denies nausea or vomiting. The patient does endorse weakness, dizziness. She denies racing or skipping in her height heart. She denies a history of hypotension. Past Medical History  Diagnosis Date  . Hypertension   . Myocardial infarction   . Shortness of breath   . Asthma   . COPD (chronic obstructive pulmonary disease)     emphysema    . Pneumonia     hx pneumonia ,chronic bronchitis   . H/O blood clots   . Chronic kidney disease     current   UTI   being treated  . GERD (gastroesophageal reflux disease)   . Headache(784.0)     hx migraines  . Blood dyscrasia     hx blood clotts  . Arthritis    Past Surgical History  Procedure Laterality Date  . Right leg    . Hip fracture surgery      RIGHT SIDE  . Shoulder surgery      RIGHT  . Cholecystectomy    . Eye surgery      BIL  CATARACT   . Orif wrist fracture  10/13/2011    Procedure: OPEN REDUCTION INTERNAL FIXATION (ORIF) WRIST FRACTURE;  Surgeon: [name], MD;  Location: [place] [facility];  Service: Orthopedics;  Laterality: Right;  Open Reduction Internal Fixation Right Distal Radius   . Abdominal hysterectomy    . Btl    . Hardware removal  01/29/2012    Procedure: HARDWARE REMOVAL;  Surgeon: [name], MD;  Location: [place];  Service: Orthopedics;  Laterality: Right;  . Hip arthroplasty  01/29/2012    Procedure: ARTHROPLASTY BIPOLAR HIP;  Surgeon: [name], MD;  Location: [place];  Service: Orthopedics;  Laterality: Right;   Family History  Problem Relation Age of Onset  . Cancer - Lung Mother   . Coronary artery disease Mother   . Coronary artery disease Sister   . Coronary artery disease Brother    History  Substance Use Topics  . Smoking status: Former Smoker -- 1.00 packs/day for 50 years    Types: Cigarettes    Quit date: 06/27/2013  . Smokeless tobacco: Not on file  . Alcohol Use: No   OB History    No data available     Review of Systems    Allergies  Review of patient's allergies indicates no known allergies.  Home Medications   Prior to Admission medications   Medication Sig Start  Date End Date Taking? Authorizing Provider  albuterol (PROVENTIL) (2.5 MG/3ML) 0.083% nebulizer solution Take 2.5 mg by nebulization every 6 (six) hours.    Historical Provider, MD  atorvastatin (LIPITOR) 40 MG tablet Take 40 mg by mouth daily.  07/02/13   Historical Provider, MD  budesonide-formoterol (SYMBICORT) 160-4.5 MCG/ACT inhaler Inhale 2 puffs into the lungs 2 (two) times daily. 08/06/13   Barton Dubois, MD  digoxin (LANOXIN) 0.25 MG tablet Take 0.125 mg by mouth daily.     Historical Provider, MD  diltiazem (CARDIZEM CD) 240 MG 24 hr capsule Take 240 mg by mouth daily. 04/08/14   Historical Provider, MD  docusate sodium (COLACE) 100 MG capsule Take 100 mg by mouth daily as needed (constipation).      Historical Provider, MD  fluticasone (FLONASE) 50 MCG/ACT nasal spray Place 1 spray into both nostrils daily as needed for allergies or rhinitis. 08/06/13   Barton Dubois, MD  furosemide (LASIX) 40 MG tablet Take 40 mg by mouth daily.    Historical Provider, MD  iron polysaccharides (NIFEREX) 150 MG capsule Take 1 capsule (150 mg total) by mouth daily. 04/29/14   Annita Brod, MD  loperamide (IMODIUM) 2 MG capsule Take 2 mg by mouth 2 (two) times daily as needed for diarrhea or loose stools.     Historical Provider, MD  metoprolol succinate (TOPROL-XL) 50 MG 24 hr tablet Take 12.5 mg by mouth daily. Take with or immediately following a meal.    Historical Provider, MD  Omega-3 Fatty Acids (FISH OIL) 1200 MG CAPS Take 1,200 mg by mouth daily.    Historical Provider, MD  OVER THE COUNTER MEDICATION Take 1 tablet by mouth at bedtime. Hyland's Leg Cramps PM for sleep and leg cramps    Historical Provider, MD  oxybutynin (DITROPAN-XL) 5 MG 24 hr tablet Take 10 mg by mouth at bedtime.    Historical Provider, MD  pantoprazole (PROTONIX) 40 MG tablet Take 40 mg by mouth daily.  06/12/13   Historical Provider, MD  rivaroxaban (XARELTO) 20 MG TABS tablet Take 20 mg by mouth daily with supper.    Historical Provider, MD  tiotropium (SPIRIVA) 18 MCG inhalation capsule Place 1 capsule (18 mcg total) into inhaler and inhale daily. Patient taking differently: Place 18 mcg into inhaler and inhale 2 (two) times daily.  08/06/13   Barton Dubois, MD  traMADol (ULTRAM) 50 MG tablet Take 50-100 mg by mouth 2 (two) times daily as needed (pain).     Historical Provider, MD  vitamin B-12 (CYANOCOBALAMIN) 500 MCG tablet Take 1,000 mcg by mouth daily.     Historical Provider, MD   BP 117/65 mmHg  Pulse 76  Temp(Src) 98.4 F (36.9 C) (Oral)  Resp 21  Ht 5\' 5"  (1.651 m)  Wt 120 lb (54.432 kg)  BMI 19.97 kg/m2  SpO2 90% Physical Exam Physical Exam  Nursing note and vitals reviewed. Constitutional: She is oriented to  person, place, and time. She appears well-developed and well-nourished. No distress.  HENT:  Head: Normocephalic and atraumatic.  Eyes: Conjunctivae normal and EOM are normal. Pupils are equal, round, and reactive to light. No scleral icterus.  Neck: Normal range of motion.  Cardiovascular: Normal rate, regular rhythm and normal heart sounds.  Exam reveals no gallop and no friction rub.   No murmur heard. Pulmonary/Chest: Effort normal and breath sounds normal. No respiratory distress.  Abdominal: Soft. Bowel sounds are normal. She exhibits no distension and no mass. There is no tenderness. There  is no guarding.  Neurological: She is alert and oriented to person, place, and time.  Skin: Skin is warm and dry. She is not diaphoretic.    ED Course  Procedures (including critical care time) Labs Review Labs Reviewed  POC OCCULT BLOOD, ED - Abnormal; Notable for the following:    Fecal Occult Bld POSITIVE (*)    All other components within normal limits  CBC WITH DIFFERENTIAL/PLATELET  COMPREHENSIVE METABOLIC PANEL  PROTIME-INR  OCCULT BLOOD X 1 CARD TO LAB, STOOL  I-STAT CG4 LACTIC ACID, ED  TYPE AND SCREEN    Imaging Review No results found.   EKG Interpretation   Date/Time:  Saturday June 08 2014 11:19:40 EDT Ventricular Rate:  75 PR Interval:  152 QRS Duration: 89 QT Interval:  488 QTC Calculation: 545 R Axis:   71 Text Interpretation:  Sinus rhythm Ventricular premature complex Probable  left atrial enlargement Probable LVH with secondary repol abnrm Prolonged  QT interval Baseline wander in lead(s) II III aVF No significant change  since last tracing Confirmed by ZACKOWSKI  MD, SCOTT ([id number]) on 06/08/2014  11:42:39 AM      MDM   Final diagnoses:  None    Patient with +hematochezia, gross hematuria. The patient's hgb is wnl, however I feel she will need an obs admission to monitor her hgb. Negative abdominal CT> her platelet count is also low.  She is stable  throughout the ED visit.Margarita Mail, PA-C 06/10/14 [place], PA-C 07/18/14 [phone]

## 2014-06-08 NOTE — H&P (Signed)
PATIENT DETAILS Name: [name] [name] Age: 74 y.o. Sex: female Date of Birth: [birth year] Admit Date: 06/08/2014 RKY:[id number] L, MD   CHIEF COMPLAINT:  Hematuria for the past 3 days Hematochezia since today  HPI: [name] [name] is a 74 y.o. female with a Past Medical History of paroxysmal atrial fibrillation on Xarelto, recent hospitalization for anemia-which is microcytic (never had colonoscopy in the past), COPD, CAD who presents today with the above noted complaint. Please note, patient is extremely hard of hearing and is a poor historian, no family member available at bedside. Per patient, for the past 3 days she has had intermittent hematuria. She describes associated lower pelvic pain and some bilateral flank pain. She denies any fever, nausea, vomiting or diarrhea. She has had episodes like this in the past, and has been attributed to acute hemorrhagic cystitis. Her last hospitalization was a month back, when she was noted to have a hemoglobin of 6, and was transfused hemoglobin, given IV antibiotics and managed accordingly. She claims that since today, she is also had 1-2 episodes of hematochezia. However, per ED PA-C-stools were not bloody on rectal exam. Her hemoglobin was 13.1. CT of the abdomen was negative for acute abnormalities.  I was asked to admit this patient for further evaluation and treatment. Patient denies any worsening of shortness of breath, chest pain, headache, fever.  ALLERGIES:  No Known Allergies  PAST MEDICAL HISTORY: Past Medical History  Diagnosis Date  . Hypertension   . Myocardial infarction   . Shortness of breath   . Asthma   . COPD (chronic obstructive pulmonary disease)     emphysema    . Pneumonia     hx pneumonia ,chronic bronchitis   . H/O blood clots   . Chronic kidney disease     current   UTI   being treated  . GERD (gastroesophageal reflux disease)   . Headache(784.0)     hx migraines  . Blood dyscrasia     hx blood  clotts  . Arthritis     PAST SURGICAL HISTORY: Past Surgical History  Procedure Laterality Date  . Right leg    . Hip fracture surgery      RIGHT SIDE  . Shoulder surgery      RIGHT  . Cholecystectomy    . Eye surgery      BIL CATARACT   . Orif wrist fracture  10/13/2011    Procedure: OPEN REDUCTION INTERNAL FIXATION (ORIF) WRIST FRACTURE;  Surgeon: [name], MD;  Location: [facility];  Service: Orthopedics;  Laterality: Right;  Open Reduction Internal Fixation Right Distal Radius   . Abdominal hysterectomy    . Btl    . Hardware removal  01/29/2012    Procedure: HARDWARE REMOVAL;  Surgeon: [name], MD;  Location: [place] [facility];  Service: Orthopedics;  Laterality: Right;  . Hip arthroplasty  01/29/2012    Procedure: ARTHROPLASTY BIPOLAR HIP;  Surgeon: [name], MD;  Location: [facility];  Service: Orthopedics;  Laterality: Right;    MEDICATIONS AT HOME: Prior to Admission medications   Medication Sig Start Date End Date Taking? Authorizing Provider  albuterol (PROVENTIL) (2.5 MG/3ML) 0.083% nebulizer solution Take 2.5 mg by nebulization every 6 (six) hours.   Yes Historical Provider, MD  atorvastatin (LIPITOR) 40 MG tablet Take 40 mg by mouth daily.  07/02/13  Yes Historical Provider, MD  budesonide-formoterol (SYMBICORT) 160-4.5 MCG/ACT inhaler Inhale 2 puffs into the lungs 2 (  two) times daily. 08/06/13  Yes Barton Dubois, MD  digoxin (LANOXIN) 0.25 MG tablet Take 0.125 mg by mouth daily.    Yes Historical Provider, MD  diltiazem (CARDIZEM CD) 240 MG 24 hr capsule Take 240 mg by mouth daily. 04/08/14  Yes Historical Provider, MD  docusate sodium (COLACE) 100 MG capsule Take 100 mg by mouth daily as needed (constipation).    Yes Historical Provider, MD  fluticasone (FLONASE) 50 MCG/ACT nasal spray Place 1 spray into both nostrils daily as needed for allergies or rhinitis. 08/06/13  Yes Barton Dubois, MD  furosemide (LASIX) 40 MG tablet Take 40 mg by mouth daily.   Yes Historical Provider,  MD  iron polysaccharides (NIFEREX) 150 MG capsule Take 1 capsule (150 mg total) by mouth daily. 04/29/14  Yes Annita Brod, MD  loperamide (IMODIUM) 2 MG capsule Take 2 mg by mouth 2 (two) times daily as needed for diarrhea or loose stools.    Yes Historical Provider, MD  metoprolol succinate (TOPROL-XL) 50 MG 24 hr tablet Take 12.5 mg by mouth daily. Take with or immediately following a meal.   Yes Historical Provider, MD  Omega-3 Fatty Acids (FISH OIL) 1200 MG CAPS Take 1,200 mg by mouth daily.   Yes Historical Provider, MD  OVER THE COUNTER MEDICATION Take 1 tablet by mouth at bedtime. Hyland's Leg Cramps PM for sleep and leg cramps   Yes Historical Provider, MD  oxybutynin (DITROPAN-XL) 5 MG 24 hr tablet Take 10 mg by mouth at bedtime.   Yes Historical Provider, MD  pantoprazole (PROTONIX) 40 MG tablet Take 40 mg by mouth daily.  06/12/13  Yes Historical Provider, MD  rivaroxaban (XARELTO) 20 MG TABS tablet Take 20 mg by mouth daily with supper.   Yes Historical Provider, MD  tiotropium (SPIRIVA) 18 MCG inhalation capsule Place 1 capsule (18 mcg total) into inhaler and inhale daily. Patient taking differently: Place 18 mcg into inhaler and inhale 2 (two) times daily.  08/06/13  Yes Barton Dubois, MD  traMADol (ULTRAM) 50 MG tablet Take 50-100 mg by mouth 2 (two) times daily as needed (pain).    Yes Historical Provider, MD  VENTOLIN HFA 108 (90 BASE) MCG/ACT inhaler Inhale 2 puffs into the lungs 4 (four) times daily as needed. wheezing 06/07/14  Yes Historical Provider, MD  vitamin B-12 (CYANOCOBALAMIN) 500 MCG tablet Take 1,000 mcg by mouth daily.    Yes Historical Provider, MD    FAMILY HISTORY: Family History  Problem Relation Age of Onset  . Cancer - Lung Mother   . Coronary artery disease Mother   . Coronary artery disease Sister   . Coronary artery disease Brother     SOCIAL HISTORY:  reports that she quit smoking about a year ago. Her smoking use included Cigarettes. She has a 50  pack-year smoking history. She does not have any smokeless tobacco history on file. She reports that she does not drink alcohol or use illicit drugs.  REVIEW OF SYSTEMS:  Constitutional:   No  weight loss, night sweats,  Fevers, chills, fatigue.  HEENT:    No headaches, Difficulty swallowing,Tooth/dental problems,Sore throat  Cardio-vascular: No chest pain,  Orthopnea, PND, swelling in lower extremities, anasarca,  dizziness, palpitations  GI:  No heartburn, indigestion, abdominal pain, nausea, vomiting, diarrhea  Resp: No shortness of breath with exertion or at rest.  No excess mucus, no productive cough, No non-productive cough,  No coughing up of blood.No change in color of mucus.No wheezing.No chest wall deformity  Skin:  no rash or lesions.  GU:  no dysuria, change in color of urine, no urgency or frequency.  No flank pain.  Musculoskeletal: No joint pain or swelling.  No decreased range of motion.  No back pain.  Psych: No change in mood or affect. No depression or anxiety.  No memory loss.   PHYSICAL EXAM: Blood pressure 130/63, pulse 72, temperature 98.4 F (36.9 C), temperature source Oral, resp. rate 20, height 5\' 5"  (1.651 m), weight 54.432 kg (120 lb), SpO2 95 %.  General appearance :Awake, alert, not in any distress. Speech Clear. Not toxic Looking HEENT: Atraumatic and Normocephalic, pupils equally reactive to light and accomodation Neck: supple, no JVD. No cervical lymphadenopathy.  Chest:Good air entry bilaterally, no added sounds  CVS: S1 S2 regular, no murmurs.  Abdomen: Bowel sounds present, Non tender and not distended with no gaurding, rigidity or rebound. Extremities: B/L Lower Ext shows no edema, both legs are warm to touch Neurology: Non focal Skin:No Rash Wounds:N/A  LABS ON ADMISSION:   Recent Labs  06/08/14 1319  NA 138  K 4.2  CL 95*  CO2 33*  GLUCOSE 82  BUN 23  CREATININE 0.79  CALCIUM 9.6    Recent Labs  06/08/14 1319  AST  21  ALT 13  ALKPHOS 72  BILITOT 0.6  PROT 8.0  ALBUMIN 3.7   No results for input(s): LIPASE, AMYLASE in the last 72 hours.  Recent Labs  06/08/14 1319  WBC 6.3  NEUTROABS 4.2  HGB 13.1  HCT 43.4  MCV 77.9*  PLT 92*   No results for input(s): CKTOTAL, CKMB, CKMBINDEX, TROPONINI in the last 72 hours. No results for input(s): DDIMER in the last 72 hours. Invalid input(s): POCBNP   RADIOLOGIC STUDIES ON ADMISSION: Ct Abdomen Pelvis W Contrast  06/08/2014   CLINICAL DATA:  Abdominal pain, hematuria, hematochezia  EXAM: CT ABDOMEN AND PELVIS WITH CONTRAST  TECHNIQUE: Multidetector CT imaging of the abdomen and pelvis was performed using the standard protocol following bolus administration of intravenous contrast.  CONTRAST:  147mL OMNIPAQUE IOHEXOL 300 MG/ML  SOLN  COMPARISON:  10/14/2013  FINDINGS: Sagittal images of the spine are unremarkable. Degenerative changes thoracolumbar spine.  The patient is status post cholecystectomy. Stable mild intra or extrahepatic biliary ductal dilatation. CBD measures 1 cm in diameter.  The pancreas, spleen and adrenal glands are unremarkable. Atherosclerotic calcifications of abdominal aorta and iliac arteries again noted. Again noted ectatic distal abdominal aorta. Atherosclerotic calcifications are SMA.  Enhanced kidneys are symmetrical in size. No hydronephrosis or hydroureter. There is a cyst in lower pole anterior aspect of the left kidney measures 1.8 cm. Stable from prior exam.  Delayed renal images shows bilateral renal symmetrical excretion. Bilateral visualized proximal ureter is unremarkable.  Abundant stool is noted in right colon and transverse colon. No pericecal inflammation.  There is no small bowel obstruction. There is a distended urinary bladder. There is focal high density pre within posterior aspect of the bladder axial image 62 measures 1.4 cm. Blood products/ clot cannot be excluded. Clinical correlation is necessary. Extensive metallic  artifact are noted from right hip prosthesis limiting evaluation of the pelvis. The patient is status post hysterectomy.  Some stool are noted in distal sigmoid colon and rectum. There is no distal colonic obstruction. No evidence of colitis or diverticulitis.  IMPRESSION: 1. There is no acute inflammatory process within abdomen. 2. Status postcholecystectomy. Stable mild intra and extrahepatic biliary ductal dilatation. 3. Abundant stool noted in  right colon and transverse colon. No small bowel or colonic obstruction. 4. No pericecal inflammation.  Unremarkable terminal ileum. 5. Moderate distended urinary bladder. Question small focal debris/clot within posterior aspect of the bladder. See axial image 62. Clinical correlation is necessary. 6. No evidence of colitis or diverticulitis. 7. Extensive atherosclerotic vascular calcifications. Again noted ectatic distal abdominal aorta appear   Electronically Signed   By: Lahoma Crocker M.D.   On: 06/08/2014 15:30     EKG: Independently reviewed. Normal sinus rhythm  ASSESSMENT AND PLAN: Present on Admission:  . Cystitis, acute hemorrhagic: Obtain urine culture, start empiric Rocephin. Hold Xarelto for now. Patient has had recurrent anemia over the past few years requiring PRBC transfusion, given that she is a poor historian, I am not sure all of this is explained by hematuria. She is microcytic, and is on anticoagulation, has never had a colonoscopy before, and probably needs a colonoscopy at some point. See below. However if her colonoscopy, is negative, she may need urology evaluation with a cystoscopy.  . Hematochezia: Patient has microcytic anemia, and claims to have intermittent hematochezia over the past few months. She's never had a colonoscopy before, and I am not sure if all of her recurrent anemia requiring PRBC transfusion is attributable to just hematuria, I have consulted gastroenterology (Dr. Collene Mares) for a colonoscopy. I will hold her [place]  ongoing hematuria. No active hematochezia seen during my exam, per ED PA-C, stools were nonbloody on rectal exam.   . Microcytic anemia: Obtain anemia panel. As above.   . Paroxysmal atrial fibrillation: Currently in sinus rhythm. Holding anticoagulation as noted above. Continue other rate control medications including digoxin. Check digoxin level.   . Thrombocytopenia: Chronic issue.   Marland Kitchen COPD (chronic obstructive pulmonary disease): Stable, continue as needed bronchodilators   . CAD (coronary artery disease): Without any chest pain or shortness of breath.  Further plan will depend as patient's clinical course evolves and further radiologic and laboratory data become available. Patient will be monitored closely.  Above noted plan was discussed with patient, she was in agreement.   DVT Prophylaxis: SCD's  Code Status: Full Code  Disposition Plan: Home in 2-3 days   Total time spent for admission equals 45 minutes.  [facility] Pager [phone]  If 7PM-7AM, please contact night-coverage [email or web address] Password [facility] 06/08/2014, 4:28 PM

## 2014-06-08 NOTE — ED Notes (Signed)
Pt here via EMS from home with c/o dark stools with bright red blood in toilet. Pt c/o generalized abdominal pain that radiates into back, burning with urination. Pt does take xarelto. NAD noted EMS gave 150 NS and 12.5 d50 (CBG 68 before, 175 after). Pt wears 2l O2 at home.

## 2014-06-08 NOTE — ED Provider Notes (Signed)
Medical screening examination/treatment/procedure(s) were conducted as a shared visit with non-physician practitioner(s) and myself.  I personally evaluated the patient during the encounter.   EKG Interpretation   Date/Time:  Saturday June 08 2014 11:19:40 EDT Ventricular Rate:  75 PR Interval:  152 QRS Duration: 89 QT Interval:  488 QTC Calculation: 545 R Axis:   71 Text Interpretation:  Sinus rhythm Ventricular premature complex Probable  left atrial enlargement Probable LVH with secondary repol abnrm Prolonged  QT interval Baseline wander in lead(s) II III aVF No significant change  since last tracing Confirmed by Lummie Montijo  MD, Ramaya Guile [phone]) on 06/08/2014  11:42:39 AM      Results for orders placed or performed during the hospital encounter of 06/08/14  CBC WITH DIFFERENTIAL  Result Value Ref Range   WBC 6.3 4.0 - 10.5 K/uL   RBC 5.57 (H) 3.87 - 5.11 MIL/uL   Hemoglobin 13.1 12.0 - 15.0 g/dL   HCT 43.4 36.0 - 46.0 %   MCV 77.9 (L) 78.0 - 100.0 fL   MCH 23.5 (L) 26.0 - 34.0 pg   MCHC 30.2 30.0 - 36.0 g/dL   RDW 26.7 (H) 11.5 - 15.5 %   Platelets 92 (L) 150 - 400 K/uL   Neutrophils Relative % 67 43 - 77 %   Neutro Abs 4.2 1.7 - 7.7 K/uL   Lymphocytes Relative 20 12 - 46 %   Lymphs Abs 1.2 0.7 - 4.0 K/uL   Monocytes Relative 13 (H) 3 - 12 %   Monocytes Absolute 0.8 0.1 - 1.0 K/uL   Eosinophils Relative 1 0 - 5 %   Eosinophils Absolute 0.0 0.0 - 0.7 K/uL   Basophils Relative 1 0 - 1 %   Basophils Absolute 0.0 0.0 - 0.1 K/uL  Comprehensive metabolic panel  Result Value Ref Range   Sodium 138 135 - 145 mmol/L   Potassium 4.2 3.5 - 5.1 mmol/L   Chloride 95 (L) 96 - 112 mmol/L   CO2 33 (H) 19 - 32 mmol/L   Glucose, Bld 82 70 - 99 mg/dL   BUN 23 6 - 23 mg/dL   Creatinine, Ser 0.79 0.50 - 1.10 mg/dL   Calcium 9.6 8.4 - 10.5 mg/dL   Total Protein 8.0 6.0 - 8.3 g/dL   Albumin 3.7 3.5 - 5.2 g/dL   AST 21 0 - 37 U/L   ALT 13 0 - 35 U/L   Alkaline Phosphatase 72 39 - 117  U/L   Total Bilirubin 0.6 0.3 - 1.2 mg/dL   GFR calc non Af Amer 81 (L) >90 mL/min   GFR calc Af Amer >90 >90 mL/min   Anion gap 10 5 - 15  Protime-INR  Result Value Ref Range   Prothrombin Time 16.5 (H) 11.6 - 15.2 seconds   INR 1.32 0.00 - 1.49  Urinalysis, Routine w reflex microscopic  Result Value Ref Range   Color, Urine RED (A) YELLOW   APPearance CLOUDY (A) CLEAR   Specific Gravity, Urine 1.006 1.005 - 1.030   pH 6.5 5.0 - 8.0   Glucose, UA NEGATIVE NEGATIVE mg/dL   Hgb urine dipstick LARGE (A) NEGATIVE   Bilirubin Urine NEGATIVE NEGATIVE   Ketones, ur NEGATIVE NEGATIVE mg/dL   Protein, ur 100 (A) NEGATIVE mg/dL   Urobilinogen, UA 0.2 0.0 - 1.0 mg/dL   Nitrite NEGATIVE NEGATIVE   Leukocytes, UA TRACE (A) NEGATIVE  Urine microscopic-add on  Result Value Ref Range   WBC, UA 3-6 <3 WBC/hpf  RBC / HPF TOO NUMEROUS TO COUNT <3 RBC/hpf   Urine-Other URINALYSIS PERFORMED ON SUPERNATANT   I-Stat CG4 Lactic Acid, ED  Result Value Ref Range   Lactic Acid, Venous 0.94 0.5 - 2.0 mmol/L  POC occult blood, ED  Result Value Ref Range   Fecal Occult Bld POSITIVE (A) NEGATIVE  Type and screen  Result Value Ref Range   ABO/RH(D) B NEG    Antibody Screen POS    Sample Expiration 06/11/2014    Antibody Identification PENDING    DAT, IgG NEG    Ct Abdomen Pelvis W Contrast  06/08/2014   CLINICAL DATA:  Abdominal pain, hematuria, hematochezia  EXAM: CT ABDOMEN AND PELVIS WITH CONTRAST  TECHNIQUE: Multidetector CT imaging of the abdomen and pelvis was performed using the standard protocol following bolus administration of intravenous contrast.  CONTRAST:  139mL OMNIPAQUE IOHEXOL 300 MG/ML  SOLN  COMPARISON:  10/14/2013  FINDINGS: Sagittal images of the spine are unremarkable. Degenerative changes thoracolumbar spine.  The patient is status post cholecystectomy. Stable mild intra or extrahepatic biliary ductal dilatation. CBD measures 1 cm in diameter.  The pancreas, spleen and adrenal  glands are unremarkable. Atherosclerotic calcifications of abdominal aorta and iliac arteries again noted. Again noted ectatic distal abdominal aorta. Atherosclerotic calcifications are SMA.  Enhanced kidneys are symmetrical in size. No hydronephrosis or hydroureter. There is a cyst in lower pole anterior aspect of the left kidney measures 1.8 cm. Stable from prior exam.  Delayed renal images shows bilateral renal symmetrical excretion. Bilateral visualized proximal ureter is unremarkable.  Abundant stool is noted in right colon and transverse colon. No pericecal inflammation.  There is no small bowel obstruction. There is a distended urinary bladder. There is focal high density pre within posterior aspect of the bladder axial image 62 measures 1.4 cm. Blood products/ clot cannot be excluded. Clinical correlation is necessary. Extensive metallic artifact are noted from right hip prosthesis limiting evaluation of the pelvis. The patient is status post hysterectomy.  Some stool are noted in distal sigmoid colon and rectum. There is no distal colonic obstruction. No evidence of colitis or diverticulitis.  IMPRESSION: 1. There is no acute inflammatory process within abdomen. 2. Status postcholecystectomy. Stable mild intra and extrahepatic biliary ductal dilatation. 3. Abundant stool noted in right colon and transverse colon. No small bowel or colonic obstruction. 4. No pericecal inflammation.  Unremarkable terminal ileum. 5. Moderate distended urinary bladder. Question small focal debris/clot within posterior aspect of the bladder. See axial image 62. Clinical correlation is necessary. 6. No evidence of colitis or diverticulitis. 7. Extensive atherosclerotic vascular calcifications. Again noted ectatic distal abdominal aorta appear   Electronically Signed   By: Lahoma Crocker M.D.   On: 06/08/2014 15:30    Patient with hematuria. Patient with some isolated episodes of hypotension here. Possible hematuria could be  related to a urinary tract infection. Patient also noting blood in the stool. Hemoglobin and hematocrit stable here but she recently had a blood transfusion. Recommending admission for observation overnight to make sure the blood counts do not drop again. Patient other than the isolated blood pressure being below 90 currently blood pressures 125/60. Patient's abdomen is soft nontender slightly distended. CT of abdomen without any significant findings. Patient's stool was Hemoccult positive.  Fredia Sorrow, MD 06/08/14 [phone]

## 2014-06-08 NOTE — ED Notes (Signed)
Admitting at bedside 

## 2014-06-09 DIAGNOSIS — I2510 Atherosclerotic heart disease of native coronary artery without angina pectoris: Secondary | ICD-10-CM

## 2014-06-09 DIAGNOSIS — D509 Iron deficiency anemia, unspecified: Secondary | ICD-10-CM

## 2014-06-09 LAB — BASIC METABOLIC PANEL
ANION GAP: 5 (ref 5–15)
BUN: 12 mg/dL (ref 6–23)
CALCIUM: 8.9 mg/dL (ref 8.4–10.5)
CHLORIDE: 101 mmol/L (ref 96–112)
CO2: 34 mmol/L — ABNORMAL HIGH (ref 19–32)
Creatinine, Ser: 0.68 mg/dL (ref 0.50–1.10)
GFR calc Af Amer: >90 mL/min (ref >90)
GFR, EST NON AFRICAN AMERICAN: 85 mL/min — AB (ref >90)
Glucose, Bld: 94 mg/dL (ref 70–99)
Potassium: 4.1 mmol/L (ref 3.5–5.1)
SODIUM: 140 mmol/L (ref 135–145)

## 2014-06-09 LAB — CBC
HCT: 37.6 % (ref 36.0–46.0)
Hemoglobin: 10.8 g/dL — ABNORMAL LOW (ref 12.0–15.0)
MCH: 22.8 pg — ABNORMAL LOW (ref 26.0–34.0)
MCHC: 28.7 g/dL — ABNORMAL LOW (ref 30.0–36.0)
MCV: 79.3 fL (ref 78.0–100.0)
PLATELETS: 76 10*3/uL — AB (ref 150–400)
RBC: 4.74 MIL/uL (ref 3.87–5.11)
RDW: 26.9 % — ABNORMAL HIGH (ref 11.5–15.5)
WBC: 3.6 10*3/uL — AB (ref 4.0–10.5)

## 2014-06-09 NOTE — Progress Notes (Addendum)
[facility] PROGRESS NOTE  [name] [name] NGE:[id number] DOB: 1940/05/20 DOA: 06/08/2014 PCP: Leonides Sake, MD  Assessment/Plan: Johney Maine hematuria:  -recurrent, chronic issue, has never seen a Urologist for this (per patient) -Doubt infection, given only 3-6WBC, but agree with empiric rocephin pending urine CX -Hold Xarelto -i suspect she will need a cystoscopy -d/w [facility] he will consult, monitor Hb  . Hematochezia: Patient has microcytic anemia, and claims to have intermittent hematochezia over the past few months. She's never had a colonoscopy before, Dr.Ghimire also consulted gastroenterology (Dr. Collene Mares) for a colonoscopy.  No active hematochezia seen now   . Microcytic anemia: -anemia panel with mild iron defi.   . Paroxysmal atrial fibrillation: -in NSR, hold xarelto -continue dig, level normal   . Thrombocytopenia: Chronic issue.   Marland Kitchen COPD (chronic obstructive pulmonary disease):  -Stable, continue as needed bronchodilators   . CAD (coronary artery disease):  -stable  DVt proph: SCDs  Code Status: full code Family Communication: none at bedside(indicate person spoken with, relationship, and if by phone, the number) Disposition Plan: pending workup   Consultants:  Gi DR.Mann  HPI/Subjective: Feels weak, continues to have hematuria  Objective: Filed Vitals:   06/09/14 0958  BP: 116/64  Pulse: 51  Temp:   Resp: 18    Intake/Output Summary (Last 24 hours) at 06/09/14 1051 Last data filed at 06/09/14 1000  Gross per 24 hour  Intake    720 ml  Output   1205 ml  Net   -485 ml   Filed Weights   06/08/14 1124  Weight: 54.432 kg (120 lb)    Exam:   General:  AAOx3, frail, chronically ill appearing  Cardiovascular: S1S2/RRR  Respiratory: CTAB  Abdomen: soft, NT, BS present  Musculoskeletal: no edema c/c   Data Reviewed: Basic Metabolic Panel:  Recent Labs Lab 06/08/14 1319 06/09/14 0520  NA 138 140  K 4.2 4.1  CL  95* 101  CO2 33* 34*  GLUCOSE 82 94  BUN 23 12  CREATININE 0.79 0.68  CALCIUM 9.6 8.9   Liver Function Tests:  Recent Labs Lab 06/08/14 1319  AST 21  ALT 13  ALKPHOS 72  BILITOT 0.6  PROT 8.0  ALBUMIN 3.7   No results for input(s): LIPASE, AMYLASE in the last 168 hours. No results for input(s): AMMONIA in the last 168 hours. CBC:  Recent Labs Lab 06/08/14 1319 06/08/14 1845 06/09/14 0520  WBC 6.3 4.4 3.6*  NEUTROABS 4.2  --   --   HGB 13.1 11.0* 10.8*  HCT 43.4 38.6 37.6  MCV 77.9* 79.3 79.3  PLT 92* 63* 76*   Cardiac Enzymes: No results for input(s): CKTOTAL, CKMB, CKMBINDEX, TROPONINI in the last 168 hours. BNP (last 3 results)  Recent Labs  04/27/14 2120  BNP 109.9*    ProBNP (last 3 results)  Recent Labs  09/24/13 1028 09/26/13 1458  PROBNP 809.3* 742.8*    CBG: No results for input(s): GLUCAP in the last 168 hours.  Recent Results (from the past 240 hour(s))  MRSA PCR Screening     Status: None   Collection Time: 06/08/14  4:58 PM  Result Value Ref Range Status   MRSA by PCR NEGATIVE NEGATIVE Final    Comment:        The GeneXpert MRSA Assay (FDA approved for NASAL specimens only), is one component of a comprehensive MRSA colonization surveillance program. It is not intended to diagnose MRSA infection nor to guide or monitor treatment for MRSA infections.  Studies: Ct Abdomen Pelvis W Contrast  06/08/2014   CLINICAL DATA:  Abdominal pain, hematuria, hematochezia  EXAM: CT ABDOMEN AND PELVIS WITH CONTRAST  TECHNIQUE: Multidetector CT imaging of the abdomen and pelvis was performed using the standard protocol following bolus administration of intravenous contrast.  CONTRAST:  171mL OMNIPAQUE IOHEXOL 300 MG/ML  SOLN  COMPARISON:  10/14/2013  FINDINGS: Sagittal images of the spine are unremarkable. Degenerative changes thoracolumbar spine.  The patient is status post cholecystectomy. Stable mild intra or extrahepatic biliary ductal  dilatation. CBD measures 1 cm in diameter.  The pancreas, spleen and adrenal glands are unremarkable. Atherosclerotic calcifications of abdominal aorta and iliac arteries again noted. Again noted ectatic distal abdominal aorta. Atherosclerotic calcifications are SMA.  Enhanced kidneys are symmetrical in size. No hydronephrosis or hydroureter. There is a cyst in lower pole anterior aspect of the left kidney measures 1.8 cm. Stable from prior exam.  Delayed renal images shows bilateral renal symmetrical excretion. Bilateral visualized proximal ureter is unremarkable.  Abundant stool is noted in right colon and transverse colon. No pericecal inflammation.  There is no small bowel obstruction. There is a distended urinary bladder. There is focal high density pre within posterior aspect of the bladder axial image 62 measures 1.4 cm. Blood products/ clot cannot be excluded. Clinical correlation is necessary. Extensive metallic artifact are noted from right hip prosthesis limiting evaluation of the pelvis. The patient is status post hysterectomy.  Some stool are noted in distal sigmoid colon and rectum. There is no distal colonic obstruction. No evidence of colitis or diverticulitis.  IMPRESSION: 1. There is no acute inflammatory process within abdomen. 2. Status postcholecystectomy. Stable mild intra and extrahepatic biliary ductal dilatation. 3. Abundant stool noted in right colon and transverse colon. No small bowel or colonic obstruction. 4. No pericecal inflammation.  Unremarkable terminal ileum. 5. Moderate distended urinary bladder. Question small focal debris/clot within posterior aspect of the bladder. See axial image 62. Clinical correlation is necessary. 6. No evidence of colitis or diverticulitis. 7. Extensive atherosclerotic vascular calcifications. Again noted ectatic distal abdominal aorta appear   Electronically Signed   By: Lahoma Crocker M.D.   On: 06/08/2014 15:30    Scheduled Meds: . albuterol  2.5 mg  Nebulization Q6H  . antiseptic oral rinse  7 mL Mouth Rinse BID  . atorvastatin  40 mg Oral Daily  . budesonide-formoterol  2 puff Inhalation BID  . cefTRIAXone (ROCEPHIN)  IV  1 g Intravenous Q24H  . cyanocobalamin  1,000 mcg Oral Daily  . digoxin  0.125 mg Oral Daily  . diltiazem  240 mg Oral Daily  . furosemide  40 mg Oral Daily  . hydrocortisone  25 mg Rectal BID  . iron polysaccharides  150 mg Oral Daily  . metoprolol succinate  12.5 mg Oral Daily  . oxybutynin  10 mg Oral QHS  . pantoprazole  40 mg Oral Daily  . polyethylene glycol  17 g Oral Daily  . sodium chloride  3 mL Intravenous Q12H  . tiotropium  18 mcg Inhalation Daily   Continuous Infusions: . sodium chloride 10 mL/hr at 06/09/14 0600   Antibiotics Given (last 72 hours)    Date/Time Action Medication Dose Rate   06/08/14 1804 Given   cefTRIAXone (ROCEPHIN) 1 g in dextrose 5 % 50 mL IVPB - Premix 1 g 100 mL/hr      Active Problems:   CAD (coronary artery disease)   COPD (chronic obstructive pulmonary disease)   Thrombocytopenia  Paroxysmal atrial fibrillation   Microcytic anemia   Cystitis, acute hemorrhagic   Hematochezia    Time spent: 6min    [facility] Pager [phone]. If 7PM-7AM, please contact night-coverage at [email or web address], password [facility] 06/09/2014, 10:51 AM  LOS: 1 day

## 2014-06-09 NOTE — Consult Note (Signed)
Reason for Consult: Gross Hematuria with Anemia  Referring Physician: Darryll Capers MD  [name] [name] is an 74 y.o. female.   HPI:   1- Gross Hematuria with Anemia, Bladder Cancer - pt with recurrent gross hematura for several mos. >100PY smoker. On Xarelto at baseline for AFib (being held this hospitalization). REquired transfusion last month. CT Hematuria protocol 05/2014 withtout worrisome renal masses, ? Posterior bladder mass v. Clot. UA/micro w/o sig pyuria or bacteruria. Bedside cysto 3/27 with multifocal small bladder lesions c/w likely carcinoma, clinically localized (one dome mass, few trigone lesions)  2 - Non-Complex Left Renal Cyst - 2cm left renal cyst w/o enhancement or calcification incidental on hematuria CT 2016.   PMH sig for MI, AFib/Xarelto, chole, hyst, ortho surgeries,  Today Arnetra is seen in consultation for above, specifically for further evaluation and management of her gross hematuria.   Past Medical History  Diagnosis Date  . Hypertension   . Myocardial infarction   . Shortness of breath   . Asthma   . COPD (chronic obstructive pulmonary disease)     emphysema    . Pneumonia     hx pneumonia ,chronic bronchitis   . H/O blood clots   . Chronic kidney disease     current   UTI   being treated  . GERD (gastroesophageal reflux disease)   . Headache(784.0)     hx migraines  . Blood dyscrasia     hx blood clotts  . Arthritis     Past Surgical History  Procedure Laterality Date  . Right leg    . Hip fracture surgery      RIGHT SIDE  . Shoulder surgery      RIGHT  . Cholecystectomy    . Eye surgery      BIL CATARACT   . Orif wrist fracture  10/13/2011    Procedure: OPEN REDUCTION INTERNAL FIXATION (ORIF) WRIST FRACTURE;  Surgeon: [name], MD;  Location: [facility];  Service: Orthopedics;  Laterality: Right;  Open Reduction Internal Fixation Right Distal Radius   . Abdominal hysterectomy    . Btl    . Hardware removal  01/29/2012    Procedure:  HARDWARE REMOVAL;  Surgeon: [name], MD;  Location: [facility] [place];  Service: Orthopedics;  Laterality: Right;  . Hip arthroplasty  01/29/2012    Procedure: ARTHROPLASTY BIPOLAR HIP;  Surgeon: [name], MD;  Location: [place];  Service: Orthopedics;  Laterality: Right;  . Cardiac catheterization      Family History  Problem Relation Age of Onset  . Cancer - Lung Mother   . Coronary artery disease Mother   . Coronary artery disease Sister   . Coronary artery disease Brother     Social History:  reports that she quit smoking about a year ago. Her smoking use included Cigarettes. She has a 50 pack-year smoking history. She does not have any smokeless tobacco history on file. She reports that she does not drink alcohol or use illicit drugs.  Allergies: No Known Allergies  Medications: I have reviewed the patient's current medications.  Results for orders placed or performed during the hospital encounter of 06/08/14 (from the past 48 hour(s))  Type and screen     Status: None   Collection Time: 06/08/14 12:01 PM  Result Value Ref Range   ABO/RH(D) B NEG    Antibody Screen POS    Sample Expiration 06/11/2014    Antibody Identification ANTI C ANTI D  DAT, IgG NEG   Protime-INR     Status: Abnormal   Collection Time: 06/08/14 12:01 PM  Result Value Ref Range   Prothrombin Time 16.5 (H) 11.6 - 15.2 seconds   INR 1.32 0.00 - 1.49  I-Stat CG4 Lactic Acid, ED     Status: None   Collection Time: 06/08/14 12:10 PM  Result Value Ref Range   Lactic Acid, Venous 0.94 0.5 - 2.0 mmol/L  POC occult blood, ED     Status: Abnormal   Collection Time: 06/08/14 12:22 PM  Result Value Ref Range   Fecal Occult Bld POSITIVE (A) NEGATIVE  Urinalysis, Routine w reflex microscopic     Status: Abnormal   Collection Time: 06/08/14  1:12 PM  Result Value Ref Range   Color, Urine RED (A) YELLOW    Comment: BIOCHEMICALS MAY BE AFFECTED BY COLOR   APPearance CLOUDY (A) CLEAR   Specific Gravity, Urine  1.006 1.005 - 1.030   pH 6.5 5.0 - 8.0   Glucose, UA NEGATIVE NEGATIVE mg/dL   Hgb urine dipstick LARGE (A) NEGATIVE   Bilirubin Urine NEGATIVE NEGATIVE   Ketones, ur NEGATIVE NEGATIVE mg/dL   Protein, ur 100 (A) NEGATIVE mg/dL   Urobilinogen, UA 0.2 0.0 - 1.0 mg/dL   Nitrite NEGATIVE NEGATIVE   Leukocytes, UA TRACE (A) NEGATIVE  Urine microscopic-add on     Status: None   Collection Time: 06/08/14  1:12 PM  Result Value Ref Range   WBC, UA 3-6 <3 WBC/hpf   RBC / HPF TOO NUMEROUS TO COUNT <3 RBC/hpf   Urine-Other URINALYSIS PERFORMED ON SUPERNATANT   CBC WITH DIFFERENTIAL     Status: Abnormal   Collection Time: 06/08/14  1:19 PM  Result Value Ref Range   WBC 6.3 4.0 - 10.5 K/uL   RBC 5.57 (H) 3.87 - 5.11 MIL/uL   Hemoglobin 13.1 12.0 - 15.0 g/dL   HCT 43.4 36.0 - 46.0 %   MCV 77.9 (L) 78.0 - 100.0 fL   MCH 23.5 (L) 26.0 - 34.0 pg   MCHC 30.2 30.0 - 36.0 g/dL   RDW 26.7 (H) 11.5 - 15.5 %   Platelets 92 (L) 150 - 400 K/uL    Comment: SPECIMEN CHECKED FOR CLOTS REPEATED TO VERIFY PLATELET COUNT CONFIRMED BY SMEAR    Neutrophils Relative % 67 43 - 77 %   Neutro Abs 4.2 1.7 - 7.7 K/uL   Lymphocytes Relative 20 12 - 46 %   Lymphs Abs 1.2 0.7 - 4.0 K/uL   Monocytes Relative 13 (H) 3 - 12 %   Monocytes Absolute 0.8 0.1 - 1.0 K/uL   Eosinophils Relative 1 0 - 5 %   Eosinophils Absolute 0.0 0.0 - 0.7 K/uL   Basophils Relative 1 0 - 1 %   Basophils Absolute 0.0 0.0 - 0.1 K/uL  Comprehensive metabolic panel     Status: Abnormal   Collection Time: 06/08/14  1:19 PM  Result Value Ref Range   Sodium 138 135 - 145 mmol/L   Potassium 4.2 3.5 - 5.1 mmol/L   Chloride 95 (L) 96 - 112 mmol/L   CO2 33 (H) 19 - 32 mmol/L   Glucose, Bld 82 70 - 99 mg/dL   BUN 23 6 - 23 mg/dL   Creatinine, Ser 0.79 0.50 - 1.10 mg/dL   Calcium 9.6 8.4 - 10.5 mg/dL   Total Protein 8.0 6.0 - 8.3 g/dL   Albumin 3.7 3.5 - 5.2 g/dL   AST 21 0 -  37 U/L   ALT 13 0 - 35 U/L   Alkaline Phosphatase 72 39 - 117  U/L   Total Bilirubin 0.6 0.3 - 1.2 mg/dL   GFR calc non Af Amer 81 (L) >90 mL/min   GFR calc Af Amer >90 >90 mL/min    Comment: (NOTE) The eGFR has been calculated using the CKD EPI equation. This calculation has not been validated in all clinical situations. eGFR's persistently <90 mL/min signify possible Chronic Kidney Disease.    Anion gap 10 5 - 15  Vitamin B12     Status: Abnormal   Collection Time: 06/08/14  4:22 PM  Result Value Ref Range   Vitamin B-12 >2000 (H) 211 - 911 pg/mL    Comment: Performed at [facility]  Folate     Status: None   Collection Time: 06/08/14  4:22 PM  Result Value Ref Range   Folate >20.0 ng/mL    Comment: (NOTE) Reference Ranges        Deficient:       0.4 - 3.3 ng/mL        Indeterminate:   3.4 - 5.4 ng/mL        Normal:              > 5.4 ng/mL Performed at [facility]   Iron and TIBC     Status: Abnormal   Collection Time: 06/08/14  4:22 PM  Result Value Ref Range   Iron 174 (H) 42 - 145 ug/dL   TIBC 402 250 - 470 ug/dL   Saturation Ratios 43 20 - 55 %   UIBC 228 125 - 400 ug/dL    Comment: Performed at [facility]  Ferritin     Status: None   Collection Time: 06/08/14  4:22 PM  Result Value Ref Range   Ferritin 17 10 - 291 ng/mL    Comment: Performed at [facility]  Reticulocytes     Status: Abnormal   Collection Time: 06/08/14  4:22 PM  Result Value Ref Range   Retic Ct Pct 0.9 0.4 - 3.1 %   RBC. 5.26 (H) 3.87 - 5.11 MIL/uL   Retic Count, Manual 47.3 19.0 - 186.0 K/uL  MRSA PCR Screening     Status: None   Collection Time: 06/08/14  4:58 PM  Result Value Ref Range   MRSA by PCR NEGATIVE NEGATIVE    Comment:        The GeneXpert MRSA Assay (FDA approved for NASAL specimens only), is one component of a comprehensive MRSA colonization surveillance program. It is not intended to diagnose MRSA infection nor to guide or monitor treatment for MRSA infections.   Digoxin level     Status:  None   Collection Time: 06/08/14  6:45 PM  Result Value Ref Range   Digoxin Level 1.2 0.8 - 2.0 ng/mL  CBC     Status: Abnormal   Collection Time: 06/08/14  6:45 PM  Result Value Ref Range   WBC 4.4 4.0 - 10.5 K/uL   RBC 4.87 3.87 - 5.11 MIL/uL   Hemoglobin 11.0 (L) 12.0 - 15.0 g/dL   HCT 38.6 36.0 - 46.0 %   MCV 79.3 78.0 - 100.0 fL   MCH 22.6 (L) 26.0 - 34.0 pg   MCHC 28.5 (L) 30.0 - 36.0 g/dL   RDW 26.8 (H) 11.5 - 15.5 %   Platelets 63 (L) 150 - 400 K/uL    Comment: PLATELET COUNT CONFIRMED BY SMEAR  LARGE PLATELETS PRESENT SPECIMEN CHECKED FOR CLOTS   Basic metabolic panel     Status: Abnormal   Collection Time: 06/09/14  5:20 AM  Result Value Ref Range   Sodium 140 135 - 145 mmol/L   Potassium 4.1 3.5 - 5.1 mmol/L   Chloride 101 96 - 112 mmol/L   CO2 34 (H) 19 - 32 mmol/L   Glucose, Bld 94 70 - 99 mg/dL   BUN 12 6 - 23 mg/dL    Comment: DELTA CHECK NOTED   Creatinine, Ser 0.68 0.50 - 1.10 mg/dL   Calcium 8.9 8.4 - 10.5 mg/dL   GFR calc non Af Amer 85 (L) >90 mL/min   GFR calc Af Amer >90 >90 mL/min    Comment: (NOTE) The eGFR has been calculated using the CKD EPI equation. This calculation has not been validated in all clinical situations. eGFR's persistently <90 mL/min signify possible Chronic Kidney Disease.    Anion gap 5 5 - 15  CBC     Status: Abnormal   Collection Time: 06/09/14  5:20 AM  Result Value Ref Range   WBC 3.6 (L) 4.0 - 10.5 K/uL    Comment: SPECIMEN CHECKED FOR CLOTS REPEATED TO VERIFY    RBC 4.74 3.87 - 5.11 MIL/uL   Hemoglobin 10.8 (L) 12.0 - 15.0 g/dL    Comment: REPEATED TO VERIFY CONSISTENT WITH PREVIOUS RESULT    HCT 37.6 36.0 - 46.0 %   MCV 79.3 78.0 - 100.0 fL   MCH 22.8 (L) 26.0 - 34.0 pg   MCHC 28.7 (L) 30.0 - 36.0 g/dL   RDW 26.9 (H) 11.5 - 15.5 %   Platelets 76 (L) 150 - 400 K/uL    Comment: SPECIMEN CHECKED FOR CLOTS REPEATED TO VERIFY PLATELET COUNT CONFIRMED BY SMEAR     Ct Abdomen Pelvis W Contrast  06/08/2014    CLINICAL DATA:  Abdominal pain, hematuria, hematochezia  EXAM: CT ABDOMEN AND PELVIS WITH CONTRAST  TECHNIQUE: Multidetector CT imaging of the abdomen and pelvis was performed using the standard protocol following bolus administration of intravenous contrast.  CONTRAST:  125mL OMNIPAQUE IOHEXOL 300 MG/ML  SOLN  COMPARISON:  10/14/2013  FINDINGS: Sagittal images of the spine are unremarkable. Degenerative changes thoracolumbar spine.  The patient is status post cholecystectomy. Stable mild intra or extrahepatic biliary ductal dilatation. CBD measures 1 cm in diameter.  The pancreas, spleen and adrenal glands are unremarkable. Atherosclerotic calcifications of abdominal aorta and iliac arteries again noted. Again noted ectatic distal abdominal aorta. Atherosclerotic calcifications are SMA.  Enhanced kidneys are symmetrical in size. No hydronephrosis or hydroureter. There is a cyst in lower pole anterior aspect of the left kidney measures 1.8 cm. Stable from prior exam.  Delayed renal images shows bilateral renal symmetrical excretion. Bilateral visualized proximal ureter is unremarkable.  Abundant stool is noted in right colon and transverse colon. No pericecal inflammation.  There is no small bowel obstruction. There is a distended urinary bladder. There is focal high density pre within posterior aspect of the bladder axial image 62 measures 1.4 cm. Blood products/ clot cannot be excluded. Clinical correlation is necessary. Extensive metallic artifact are noted from right hip prosthesis limiting evaluation of the pelvis. The patient is status post hysterectomy.  Some stool are noted in distal sigmoid colon and rectum. There is no distal colonic obstruction. No evidence of colitis or diverticulitis.  IMPRESSION: 1. There is no acute inflammatory process within abdomen. 2. Status postcholecystectomy. Stable mild intra and extrahepatic biliary ductal  dilatation. 3. Abundant stool noted in right colon and transverse  colon. No small bowel or colonic obstruction. 4. No pericecal inflammation.  Unremarkable terminal ileum. 5. Moderate distended urinary bladder. Question small focal debris/clot within posterior aspect of the bladder. See axial image 62. Clinical correlation is necessary. 6. No evidence of colitis or diverticulitis. 7. Extensive atherosclerotic vascular calcifications. Again noted ectatic distal abdominal aorta appear   Electronically Signed   By: Lahoma Crocker M.D.   On: 06/08/2014 15:30    Review of Systems  Constitutional: Negative.   HENT: Negative.   Eyes: Negative.   Respiratory: Negative.   Cardiovascular: Negative.   Gastrointestinal: Positive for constipation.  Genitourinary: Positive for hematuria. Negative for dysuria and flank pain.  Skin: Negative.   Neurological: Negative.   Endo/Heme/Allergies: Negative.   Psychiatric/Behavioral: Negative.    Blood pressure 116/64, pulse 51, temperature 97.7 F (36.5 C), temperature source Oral, resp. rate 18, height $RemoveBe'5\' 5"'[id number]$  (1.651 m), weight 54.432 kg (120 lb), SpO2 100 %. Physical Exam  Constitutional: She appears well-developed and well-nourished.  Elderly, AOx3, ON Cut Off O2.   HENT:  Head: Normocephalic.  Eyes: Pupils are equal, round, and reactive to light.  Neck: Normal range of motion.  Cardiovascular: Normal rate.   GI: Soft.  Genitourinary: Vagina normal.  Musculoskeletal: Normal range of motion.  Neurological: She is alert.  Skin: Skin is warm.  Psychiatric: She has a normal mood and affect. Her behavior is normal. Judgment and thought content normal.    BEDSIIDE CYSTOSCOPY:  After discussed risks, benefit, and goals bedside cysto performed using aseptic technique with 55F flexibel cystoscope and sterile water irrigation with assitance of pt' RN. Several foci of small masses (dome 1cm, trigone 2cm x 2) noted with formed clot on them. Minimal blood in bladder. UO's anatomic and grossly unobstructed.   Assessment/Plan:  1- Gross  Hematuria with Anemia, Bladder Cancer -  Likely early / localized bladder cancer as etiology by bedside cysto / CT. Will need transurethral resection for tissue diagnosis, staging, and to hopefully help with hematuria. Will try to arrange this admission for Tuesday or Wednesday at [facility] as outpatient with transfers to / from [facility]. Stay off Xarelto in interval.   2 - Non-Complex Left Renal Cyst - no indication for further evaluation or surveillance.      Jabar Krysiak 06/09/2014, 11:12 AM

## 2014-06-10 ENCOUNTER — Other Ambulatory Visit: Payer: Self-pay | Admitting: *Deleted

## 2014-06-10 ENCOUNTER — Other Ambulatory Visit: Payer: Self-pay | Admitting: Urology

## 2014-06-10 DIAGNOSIS — R319 Hematuria, unspecified: Secondary | ICD-10-CM | POA: Diagnosis present

## 2014-06-10 DIAGNOSIS — J449 Chronic obstructive pulmonary disease, unspecified: Secondary | ICD-10-CM

## 2014-06-10 LAB — CBC
HCT: 38.4 % (ref 36.0–46.0)
HEMOGLOBIN: 10.9 g/dL — AB (ref 12.0–15.0)
MCH: 22.9 pg — AB (ref 26.0–34.0)
MCHC: 28.4 g/dL — ABNORMAL LOW (ref 30.0–36.0)
MCV: 80.7 fL (ref 78.0–100.0)
Platelets: 72 10*3/uL — ABNORMAL LOW (ref 150–400)
RBC: 4.76 MIL/uL (ref 3.87–5.11)
RDW: 26.5 % — ABNORMAL HIGH (ref 11.5–15.5)
WBC: 3.6 10*3/uL — AB (ref 4.0–10.5)

## 2014-06-10 LAB — BASIC METABOLIC PANEL
Anion gap: 10 (ref 5–15)
BUN: 7 mg/dL (ref 6–23)
CALCIUM: 8.7 mg/dL (ref 8.4–10.5)
CO2: 28 mmol/L (ref 19–32)
Chloride: 98 mmol/L (ref 96–112)
Creatinine, Ser: 0.77 mg/dL (ref 0.50–1.10)
GFR, EST NON AFRICAN AMERICAN: 81 mL/min — AB (ref >90)
GLUCOSE: 175 mg/dL — AB (ref 70–99)
POTASSIUM: 4.5 mmol/L (ref 3.5–5.1)
Sodium: 136 mmol/L (ref 135–145)

## 2014-06-10 LAB — URINE CULTURE: Colony Count: 55000

## 2014-06-10 IMAGING — CR DG HIP COMPLETE 2+V*R*
5 series · 5 of 5 positions shown · non-contrast
Comparison: 06/13/2006

CLINICAL DATA: Fall, right hip pain

RIGHT HIP - COMPLETE 2+ VIEW

[x pelvis (1 of 2)]
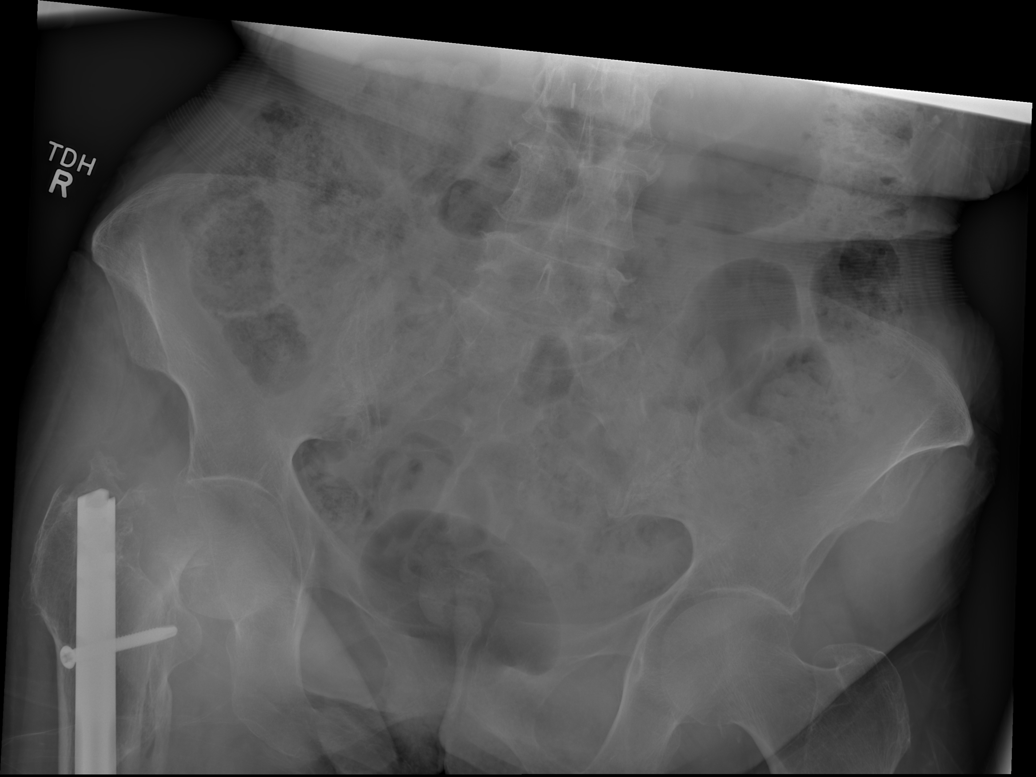

[x pelvis (2 of 2)]
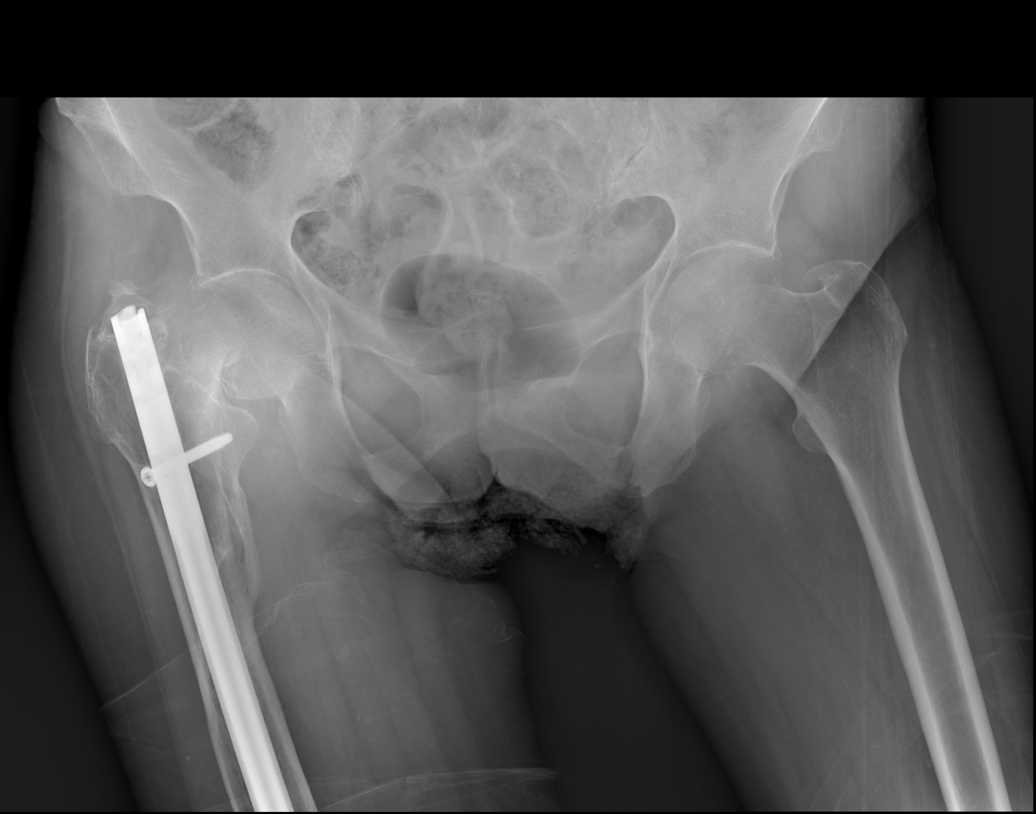

[x hip ap right (1 of 2)]
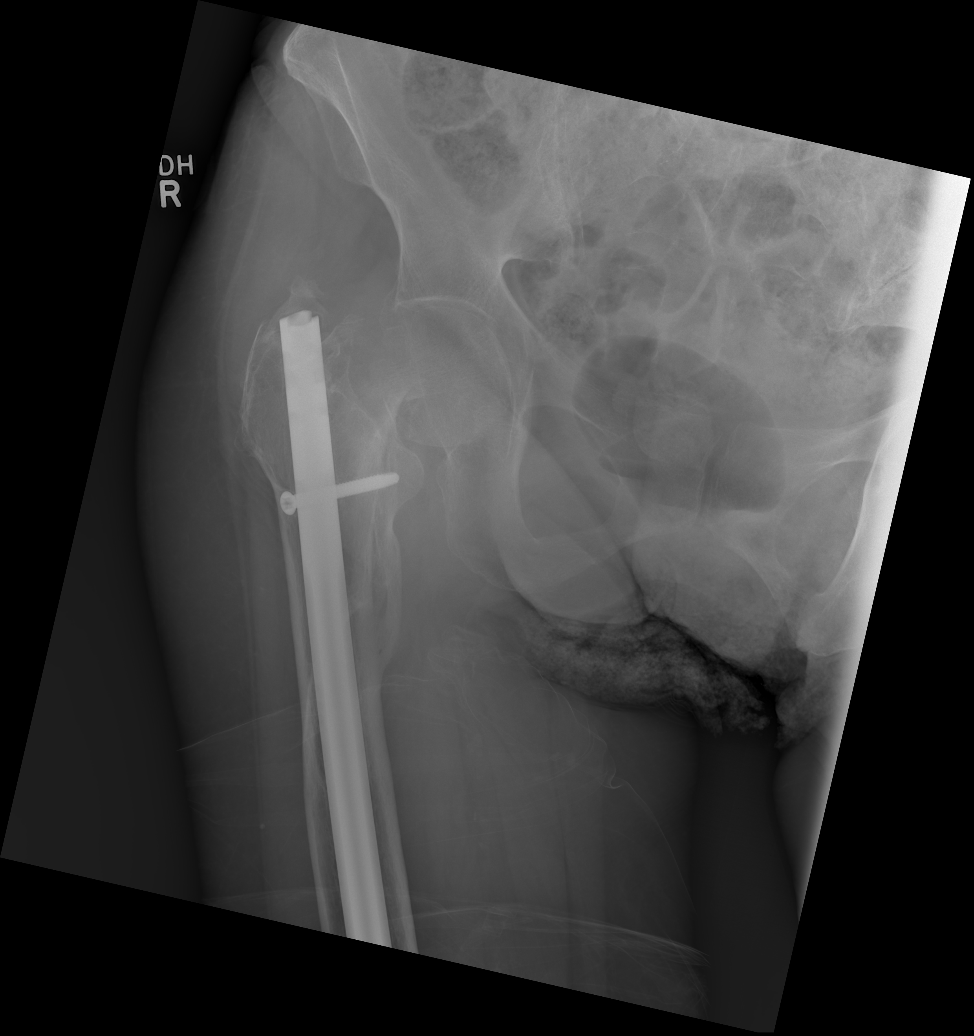

[x hip ap right (2 of 2)]
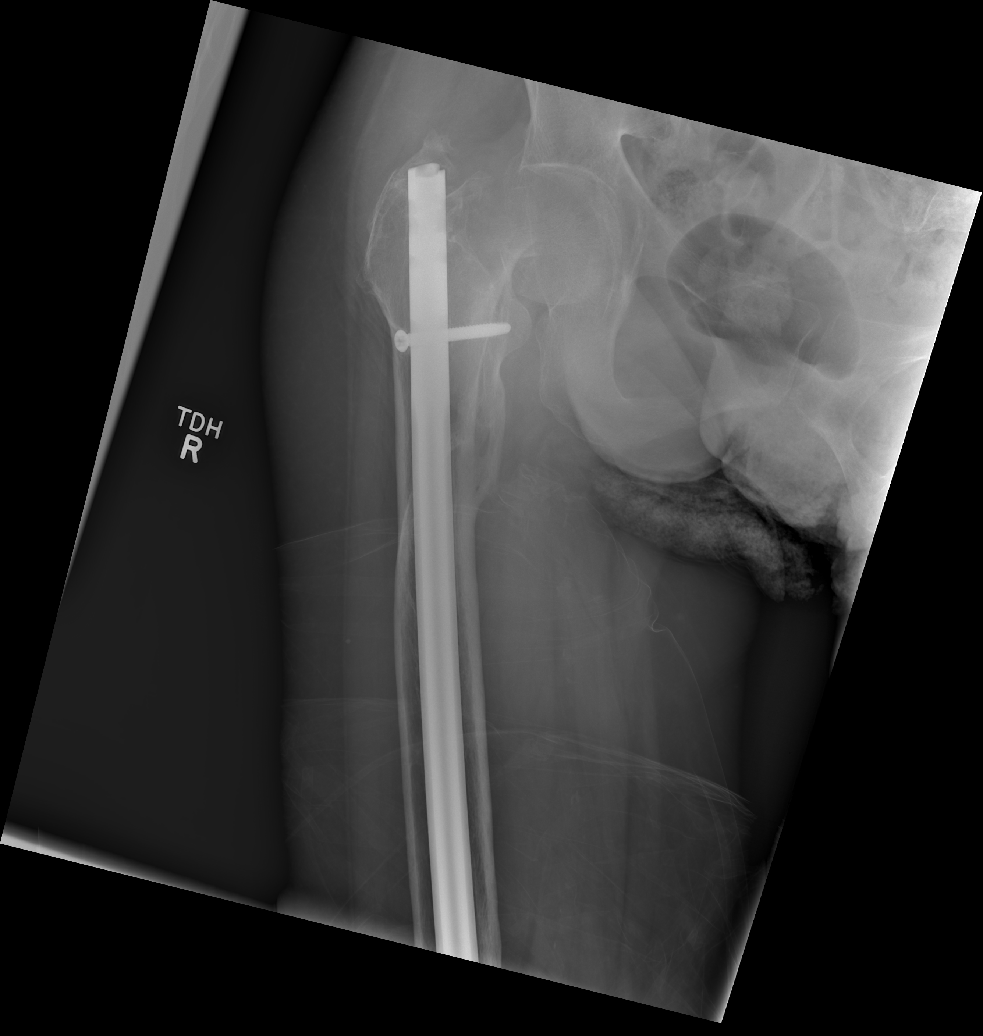

[x hip lat right]
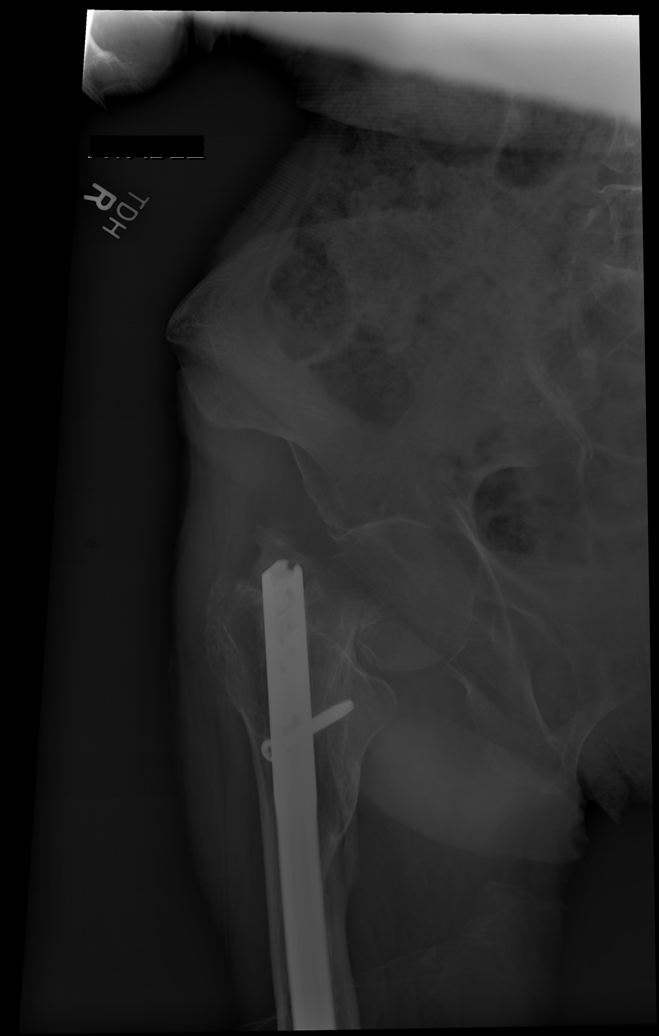

[5 of 5 positions shown; findings below may reference images not displayed]

FINDINGS: Subcapital right hip fracture with secondary varus
deformity/foreshortening.

Prior ORIF of a proximal femur fracture with associated deformity.
IMPRESSION: Subcapital right hip fracture.

## 2014-06-10 IMAGING — CR DG RIBS W/ CHEST 3+V*R*
4 series · 4 of 4 positions shown · non-contrast
Comparison: 10/13/2011

CLINICAL DATA: Fall, rib pain, shortness of breath

RIGHT RIBS AND CHEST - 3+ VIEW

[x chest ap (1 of 4)]
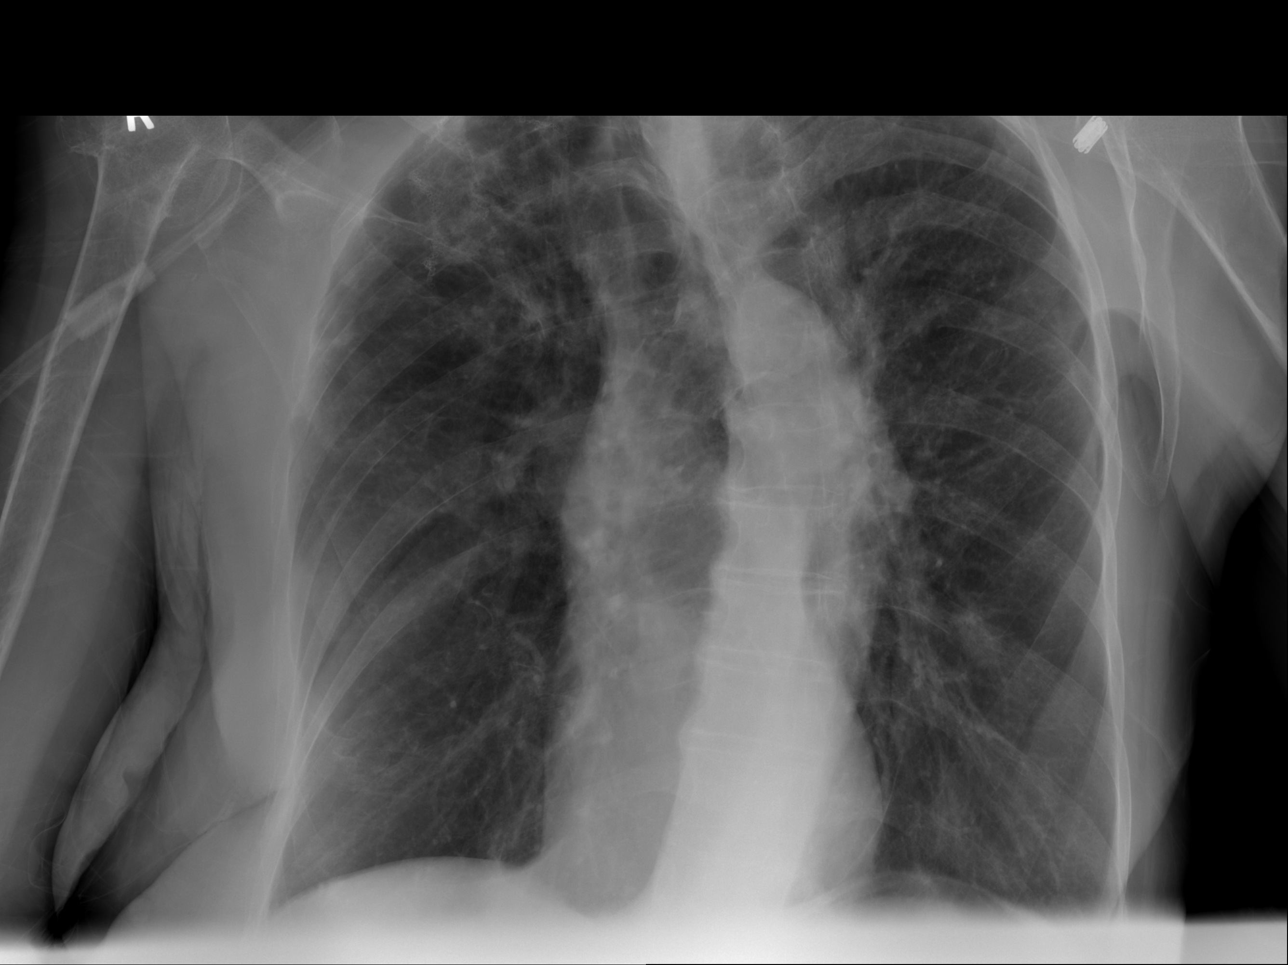

[x chest ap (2 of 4)]
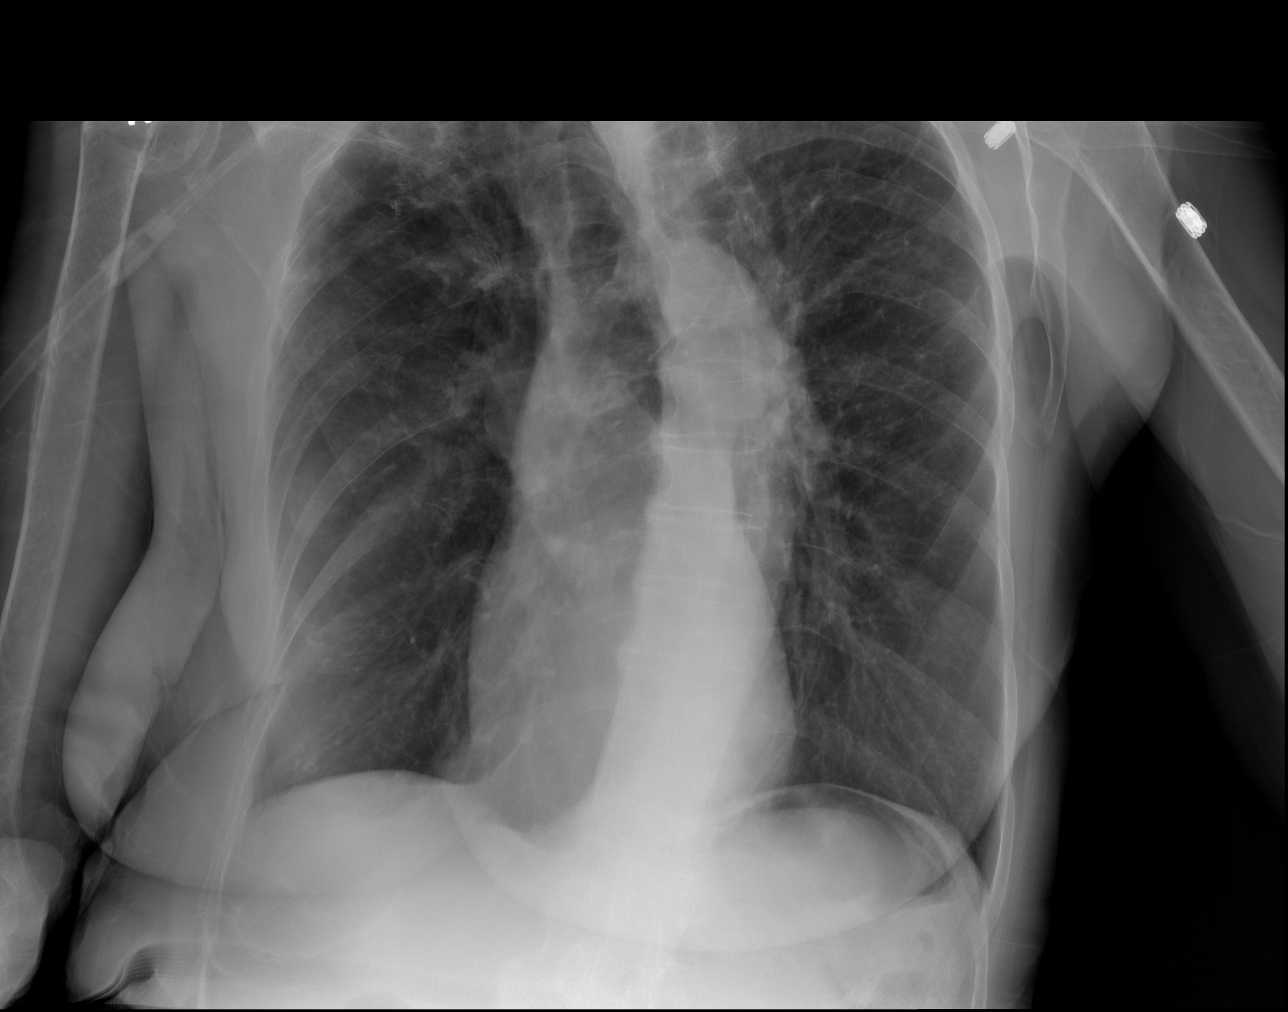

[x chest ap (3 of 4)]
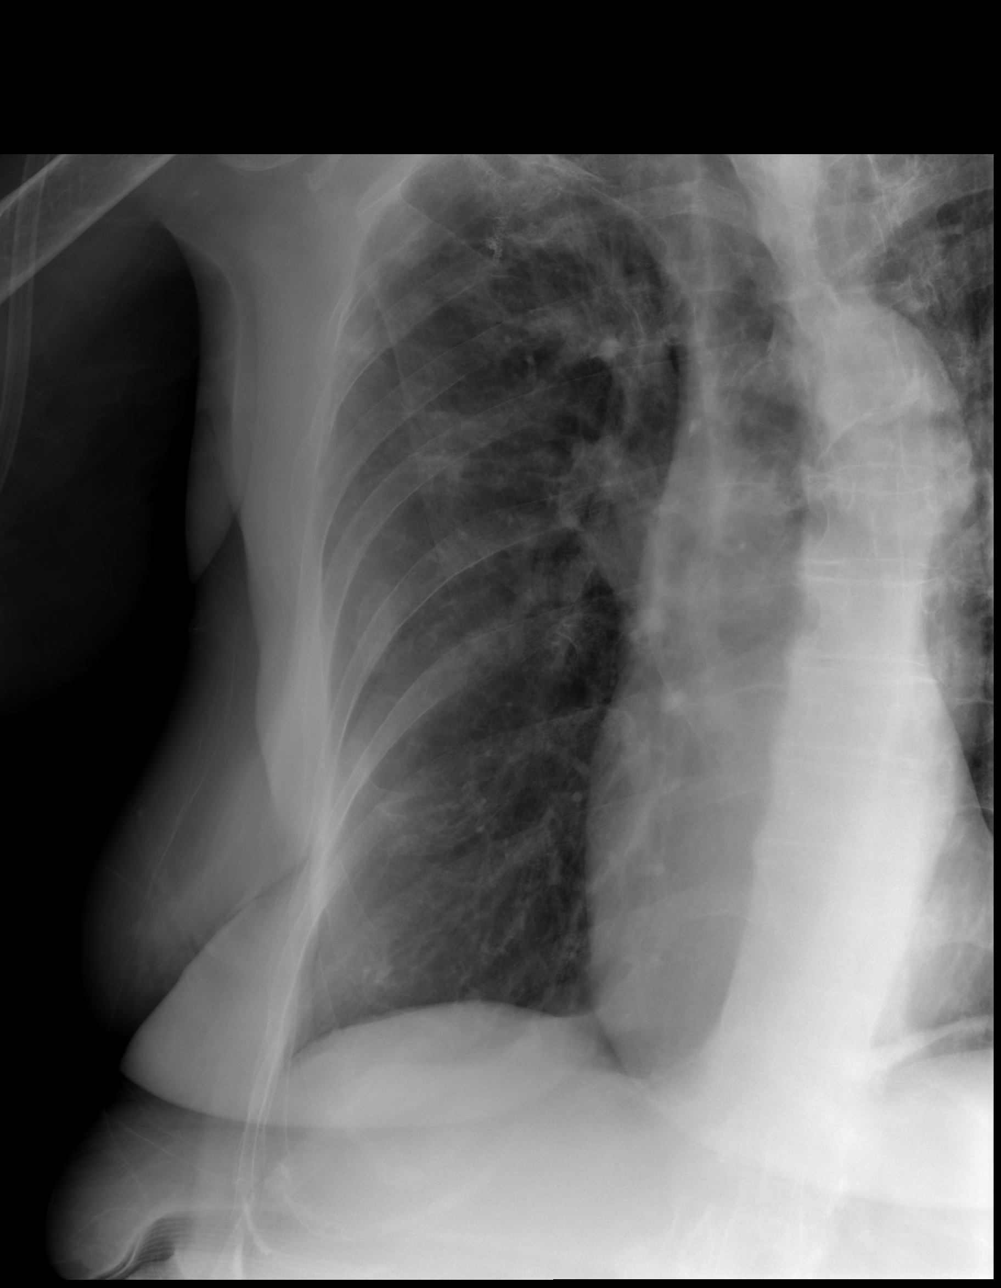

[x chest ap (4 of 4)]
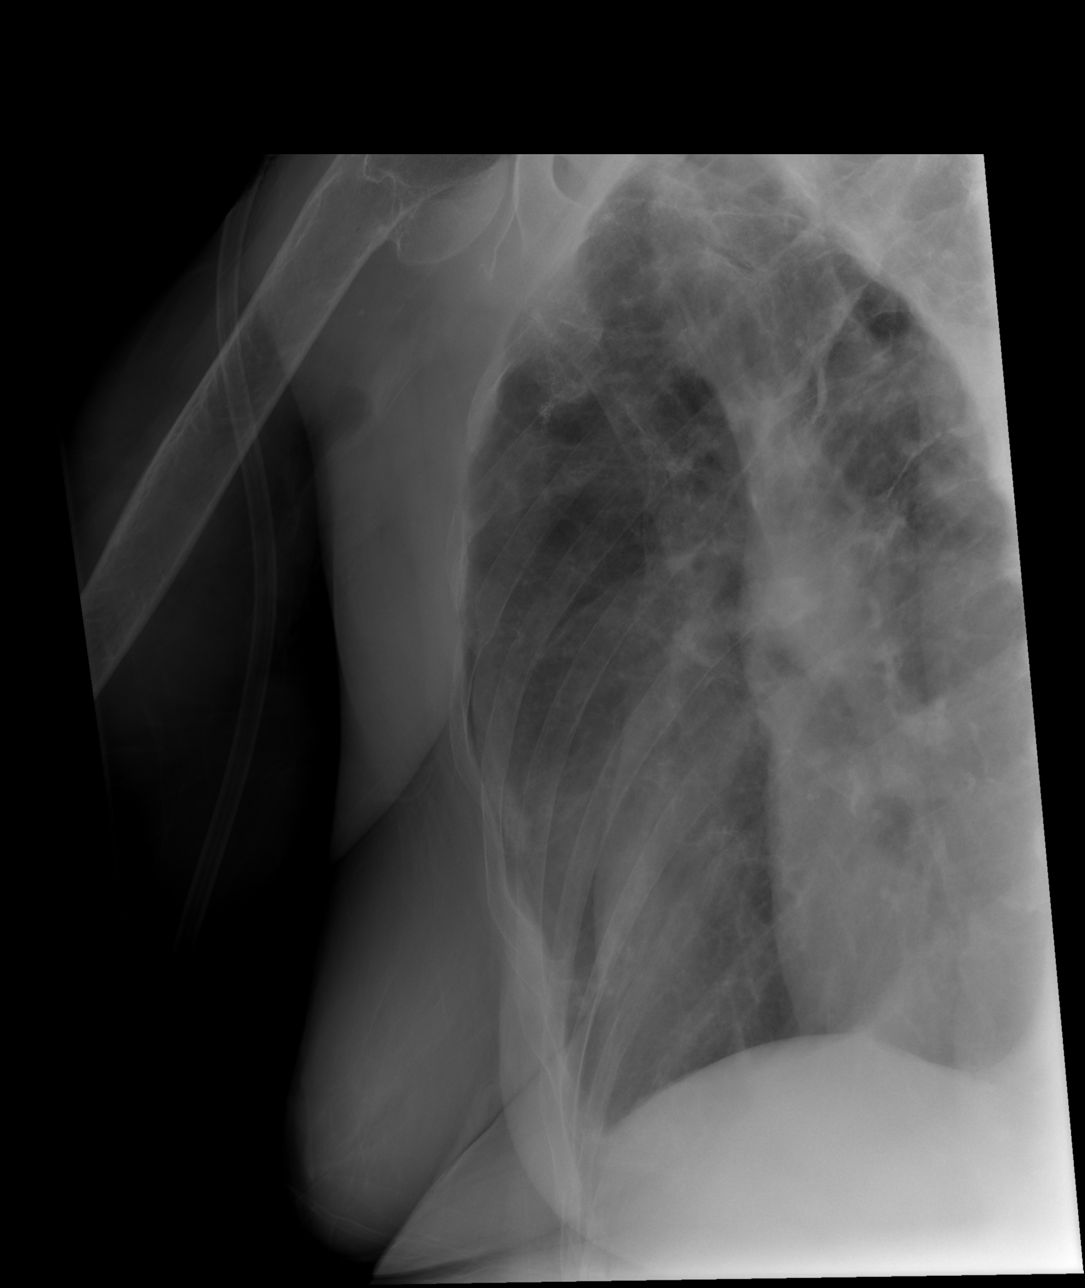

[4 of 4 positions shown; findings below may reference images not displayed]

FINDINGS: Chronic interstitial markings/emphysematous changes.
Biapical pleural parenchymal scarring. Postsurgical changes/suture
lines in the right upper lung.

Cardiomediastinal silhouette is within normal limits.

Suspected right lateral 5th and 6th rib fractures.
IMPRESSION: Suspected right lateral 5th and 6th rib fractures.

## 2014-06-10 MED ORDER — GENTAMICIN SULFATE 40 MG/ML IJ SOLN
5.0000 mg/kg | Freq: Once | INTRAVENOUS | Status: AC
  Administered 2014-06-11: 270 mg via INTRAVENOUS
  Filled 2014-06-10: qty 6.75

## 2014-06-10 MED ORDER — GENTAMICIN IN SALINE 1.6-0.9 MG/ML-% IV SOLN: 80.0000 mg | INTRAVENOUS | Status: DC

## 2014-06-10 MED ORDER — FUROSEMIDE 20 MG PO TABS
20.0000 mg | ORAL_TABLET | Freq: Every day | ORAL | Status: DC
  Administered 2014-06-10 – 2014-06-13 (×4): 20 mg via ORAL
  Filled 2014-06-10 (×4): qty 1

## 2014-06-10 NOTE — Progress Notes (Signed)
Pt transferring to [id number], planned OR case in am. Report provided. [facility] pick up. Pt aware, denies questions or concerns at this time. Tele removed and returned to unit. PIV remains, no s/s of complication. Denies discomfort.

## 2014-06-10 NOTE — Progress Notes (Signed)
Utilization review completed.  

## 2014-06-10 NOTE — Patient Outreach (Signed)
Met with the patient and her brother at bedside. Patient is currently active with [facility] for chronic disease management services.  Patient has been engaged by a [employer] and LCSW.  Our community based plan of care has focused on disease management and community resource support.  Patient will receive a post discharge transition of care call and will be evaluated for monthly home visits for assessments and disease process education.  Made Inpatient Case Manager aware that [facility] following. Of note, [facility] services does not replace or interfere with any services that are arranged by inpatient case management or social work.  For additional questions or referrals please contact: Natividad Brood, RN BSN [facility] Liaison  [phone] business mobile phone

## 2014-06-10 NOTE — Progress Notes (Addendum)
[facility] PROGRESS NOTE  [name] [name] INO:[id number] DOB: December 04, 1940 DOA: 06/08/2014 PCP: Leonides Sake, MD   Brief Narrative:  74/F with COPD/chronic resp failure on 2L Home O2, Afib on xarelto, chronic hematuria suspected to be due to hemorrhagic cystitis presented to the ER with worsening hematuria, iron defi anemia. Seen By [facility] urology, s/p cystoscopy with probable Bladder Ca, Tx to [facility] for transurethral rescetion  Assessment/Plan: Gross hematuria/Likely bladder cancer -s/p bedside cystoscopy per [facility] 3/27, greatly appreciate assistance -long standing h/o hematuria,  -Doubt infection, given only 3-6WBC, but agree with empiric rocephin pending urine CX -Xarelto on hold -plan for transurethral resection for tissue diagnosis, staging, per Dr.Manny on Tuesday at 9:15am at [facility]   . Hematochezia:  -Patient has microcytic anemia, and claims to have intermittent hematochezia over the past few months.  -She's never had a colonoscopy before, needs non urgent GI eval as outpatient No active hematochezia seen now   . Microcytic anemia: -anemia panel with mild iron defi.  -monitor Hb  . Paroxysmal atrial fibrillation: -in NSR, hold xarelto -continue dig, level normal  -continue diltiazem and low dose BB  . Thrombocytopenia:  -Chronic issue.  -needs heme workup as outpatient  . COPD (chronic obstructive pulmonary disease):  -Stable,on 2L home O2  -symbicort and continue as needed bronchodilators  -quit smoking 7 months ago, >100pack year smoker  . ? CAD (coronary artery disease):  -h/o remote MI, treated medically, no records of Cath or stress tests -last ECHO 2/16 with normal EF and wall motion -continue low dose toprol  DVt proph: SCDs  Code Status: full code Family Communication: none at bedside(called and d/w brother Mcneil Sober) Disposition Plan: pending workup   Consultants:  Gi DR.Mann  HPI/Subjective: Breathing ok, hematuria appears  to be improving  Objective: Filed Vitals:   06/10/14 0420  BP: 125/49  Pulse: 75  Temp: 98 F (36.7 C)  Resp: 12    Intake/Output Summary (Last 24 hours) at 06/10/14 0834 Last data filed at 06/10/14 0654  Gross per 24 hour  Intake    600 ml  Output   2650 ml  Net  -2050 ml   Filed Weights   06/08/14 1124  Weight: 54.432 kg (120 lb)    Exam:   General:  AAOx3, frail, chronically ill appearing  Cardiovascular: S1S2/RRR  Respiratory: CTAB  Abdomen: soft, NT, BS present  Musculoskeletal: no edema c/c   Data Reviewed: Basic Metabolic Panel:  Recent Labs Lab 06/08/14 1319 06/09/14 0520 06/10/14 0800  NA 138 140 136  K 4.2 4.1 4.5  CL 95* 101 98  CO2 33* 34* 28  GLUCOSE 82 94 175*  BUN 23 12 7   CREATININE 0.79 0.68 0.77  CALCIUM 9.6 8.9 8.7   Liver Function Tests:  Recent Labs Lab 06/08/14 1319  AST 21  ALT 13  ALKPHOS 72  BILITOT 0.6  PROT 8.0  ALBUMIN 3.7   No results for input(s): LIPASE, AMYLASE in the last 168 hours. No results for input(s): AMMONIA in the last 168 hours. CBC:  Recent Labs Lab 06/08/14 1319 06/08/14 1845 06/09/14 0520  WBC 6.3 4.4 3.6*  NEUTROABS 4.2  --   --   HGB 13.1 11.0* 10.8*  HCT 43.4 38.6 37.6  MCV 77.9* 79.3 79.3  PLT 92* 63* 76*   Cardiac Enzymes: No results for input(s): CKTOTAL, CKMB, CKMBINDEX, TROPONINI in the last 168 hours. BNP (last 3 results)  Recent Labs  04/27/14 2120  BNP 109.9*  ProBNP (last 3 results)  Recent Labs  09/24/13 1028 09/26/13 1458  PROBNP 809.3* 742.8*    CBG: No results for input(s): GLUCAP in the last 168 hours.  Recent Results (from the past 240 hour(s))  MRSA PCR Screening     Status: None   Collection Time: 06/08/14  4:58 PM  Result Value Ref Range Status   MRSA by PCR NEGATIVE NEGATIVE Final    Comment:        The GeneXpert MRSA Assay (FDA approved for NASAL specimens only), is one component of a comprehensive MRSA colonization surveillance  program. It is not intended to diagnose MRSA infection nor to guide or monitor treatment for MRSA infections.      Studies: Ct Abdomen Pelvis W Contrast  06/08/2014   CLINICAL DATA:  Abdominal pain, hematuria, hematochezia  EXAM: CT ABDOMEN AND PELVIS WITH CONTRAST  TECHNIQUE: Multidetector CT imaging of the abdomen and pelvis was performed using the standard protocol following bolus administration of intravenous contrast.  CONTRAST:  173mL OMNIPAQUE IOHEXOL 300 MG/ML  SOLN  COMPARISON:  10/14/2013  FINDINGS: Sagittal images of the spine are unremarkable. Degenerative changes thoracolumbar spine.  The patient is status post cholecystectomy. Stable mild intra or extrahepatic biliary ductal dilatation. CBD measures 1 cm in diameter.  The pancreas, spleen and adrenal glands are unremarkable. Atherosclerotic calcifications of abdominal aorta and iliac arteries again noted. Again noted ectatic distal abdominal aorta. Atherosclerotic calcifications are SMA.  Enhanced kidneys are symmetrical in size. No hydronephrosis or hydroureter. There is a cyst in lower pole anterior aspect of the left kidney measures 1.8 cm. Stable from prior exam.  Delayed renal images shows bilateral renal symmetrical excretion. Bilateral visualized proximal ureter is unremarkable.  Abundant stool is noted in right colon and transverse colon. No pericecal inflammation.  There is no small bowel obstruction. There is a distended urinary bladder. There is focal high density pre within posterior aspect of the bladder axial image 62 measures 1.4 cm. Blood products/ clot cannot be excluded. Clinical correlation is necessary. Extensive metallic artifact are noted from right hip prosthesis limiting evaluation of the pelvis. The patient is status post hysterectomy.  Some stool are noted in distal sigmoid colon and rectum. There is no distal colonic obstruction. No evidence of colitis or diverticulitis.  IMPRESSION: 1. There is no acute  inflammatory process within abdomen. 2. Status postcholecystectomy. Stable mild intra and extrahepatic biliary ductal dilatation. 3. Abundant stool noted in right colon and transverse colon. No small bowel or colonic obstruction. 4. No pericecal inflammation.  Unremarkable terminal ileum. 5. Moderate distended urinary bladder. Question small focal debris/clot within posterior aspect of the bladder. See axial image 62. Clinical correlation is necessary. 6. No evidence of colitis or diverticulitis. 7. Extensive atherosclerotic vascular calcifications. Again noted ectatic distal abdominal aorta appear   Electronically Signed   By: Lahoma Crocker M.D.   On: 06/08/2014 15:30    Scheduled Meds: . antiseptic oral rinse  7 mL Mouth Rinse BID  . atorvastatin  40 mg Oral Daily  . budesonide-formoterol  2 puff Inhalation BID  . cefTRIAXone (ROCEPHIN)  IV  1 g Intravenous Q24H  . cyanocobalamin  1,000 mcg Oral Daily  . digoxin  0.125 mg Oral Daily  . diltiazem  240 mg Oral Daily  . furosemide  40 mg Oral Daily  . hydrocortisone  25 mg Rectal BID  . iron polysaccharides  150 mg Oral Daily  . metoprolol succinate  12.5 mg Oral Daily  . oxybutynin  10 mg Oral QHS  . pantoprazole  40 mg Oral Daily  . polyethylene glycol  17 g Oral Daily  . sodium chloride  3 mL Intravenous Q12H  . tiotropium  18 mcg Inhalation Daily   Continuous Infusions: . sodium chloride 10 mL/hr at 06/09/14 0600   Antibiotics Given (last 72 hours)    Date/Time Action Medication Dose Rate   06/08/14 1804 Given   cefTRIAXone (ROCEPHIN) 1 g in dextrose 5 % 50 mL IVPB - Premix 1 g 100 mL/hr   06/09/14 1713 Given   cefTRIAXone (ROCEPHIN) 1 g in dextrose 5 % 50 mL IVPB - Premix 1 g 100 mL/hr      Active Problems:   CAD (coronary artery disease)   COPD (chronic obstructive pulmonary disease)   Thrombocytopenia   Paroxysmal atrial fibrillation   Microcytic anemia   Cystitis, acute hemorrhagic   Hematochezia    Time spent:  76min    [facility] Pager [phone]. If 7PM-7AM, please contact night-coverage at [email or web address], password [facility] 06/10/2014, 8:34 AM  LOS: 2 days

## 2014-06-10 NOTE — Progress Notes (Signed)
Subjective:  1- Gross Hematuria with Anemia, Bladder Cancer - pt with recurrent gross hematura for several mos. >100PY smoker. On Xarelto at baseline for AFib (being held this hospitalization). Required transfusion last month. CT Hematuria protocol 05/2014 withtout worrisome renal masses, ? Posterior bladder mass v. Clot. UA/micro w/o sig pyuria or bacteruria. Bedside cysto 3/27 with multifocal small bladder lesions c/w likely carcinoma, clinically localized (one dome mass, few trigone lesions)  2 - Non-Complex Left Renal Cyst - 2cm left renal cyst w/o enhancement or calcification incidental on hematuria CT 2016.   PMH sig for MI, AFib/Xarelto, chole, hyst, ortho surgeries,  Today [name] [name] is seen in f/u above. Gross hematuria nearly resolved now holding Xarelto. She is on schedule tomorrow AM for transurethral resection of bladder tumor. She was transferred to [facility] in preparation for surgery tomorrow.   Objective: Vital signs in last 24 hours: Temp:  [97.8 F (36.6 C)-98 F (36.7 C)] 97.8 F (36.6 C) (03/28 1656) Pulse Rate:  [58-75] 71 (03/28 1656) Resp:  [12-14] 14 (03/28 1656) BP: (116-135)/(29-50) 135/50 mmHg (03/28 1656) SpO2:  [96 %-100 %] 96 % (03/28 1656) Last BM Date: 06/09/14  Intake/Output from previous day: 03/27 0701 - 03/28 0700 In: 840 [P.O.:840] Out: 2650 [Urine:2650] Intake/Output this shift: Total I/O In: 600 [P.O.:600] Out: 200 [Urine:200]  General appearance: alert, cooperative and appears stated age Head: Normocephalic, without obvious abnormality, atraumatic Nose: Nares normal. Septum midline. Mucosa normal. No drainage or sinus tenderness. Throat: lips, mucosa, and tongue normal; teeth and gums normal Neck: supple, symmetrical, trachea midline Back: symmetric, no curvature. ROM normal. No CVA tenderness. Resp: non-labored on [place] O2 Cardio: Nl rate GI: soft, non-tender; bowel sounds normal; no masses,  no organomegaly Extremities: extremities  normal, atraumatic, no cyanosis or edema Pulses: 2+ and symmetric Skin: Skin color, texture, turgor normal. No rashes or lesions Neurologic: Grossly normal  Lab Results:   Recent Labs  06/09/14 0520 06/10/14 0800  WBC 3.6* 3.6*  HGB 10.8* 10.9*  HCT 37.6 38.4  PLT 76* 72*   BMET  Recent Labs  06/09/14 0520 06/10/14 0800  NA 140 136  K 4.1 4.5  CL 101 98  CO2 34* 28  GLUCOSE 94 175*  BUN 12 7  CREATININE 0.68 0.77  CALCIUM 8.9 8.7   PT/INR  Recent Labs  06/08/14 1201  LABPROT 16.5*  INR 1.32   ABG No results for input(s): PHART, HCO3 in the last 72 hours.  Invalid input(s): PCO2, PO2  Studies/Results: No results found.  Anti-infectives: Anti-infectives    Start     Dose/Rate Route Frequency Ordered Stop   06/08/14 1800  cefTRIAXone (ROCEPHIN) 1 g in dextrose 5 % 50 mL IVPB - Premix     1 g 100 mL/hr over 30 Minutes Intravenous Every 24 hours 06/08/14 1713        Assessment/Plan:  1- Gross Hematuria with Anemia, Bladder Cancer - OR tomorrow for endoscopic resection. NPO p MN. Hold Xarelto.  We discussed operative biopsy / transurethral resection as the best next step for diagnostic and therapeutic purposes with goals being to remove all visible cancer and obtain tissue for pathologic exam. We discussed that for some low-grade tumors, this may be all the treatment required, but that for many other tumors such as high-grade lesions, further therapy including surgery and or chemotherapy may be warranted. We also outlined the fact that any bladder cancer diagnosis will require close follow-up with periodic upper and lower tract evaluation. We discussed risks  including bleeding, infection, damage to kidney / ureter / bladder including bladder perforation which can typically managed with prolonged foley catheterization. We mentioned anesthetic and other rare risks including DVT, PE, MI, and mortality. I also mentioned that adjunctive procedures such as ureteral  stenting, retrograde pyelography, and ureteroscopy may be necessary to fully evaluate the urinary tract depending on intra-operative findings. After answering all questions to the patient's satisfaction, they wish to proceed.    2 - Non-Complex Left Renal Cyst - no indication for further evaluation or surveillance.   [facility], [place] 06/10/2014

## 2014-06-11 ENCOUNTER — Ambulatory Visit (HOSPITAL_COMMUNITY): Admission: RE | Admit: 2014-06-11 | Payer: Commercial Managed Care - HMO | Source: Ambulatory Visit | Admitting: Urology

## 2014-06-11 ENCOUNTER — Inpatient Hospital Stay (HOSPITAL_COMMUNITY): Payer: Commercial Managed Care - HMO | Admitting: Anesthesiology

## 2014-06-11 ENCOUNTER — Encounter (HOSPITAL_COMMUNITY)
Admission: EM | Disposition: A | Discharge status: Home / Self Care | Payer: Self-pay | Source: Home / Self Care | Attending: Internal Medicine

## 2014-06-11 ENCOUNTER — Encounter (HOSPITAL_COMMUNITY): Payer: Self-pay

## 2014-06-11 HISTORY — PX: TRANSURETHRAL RESECTION OF BLADDER TUMOR: SHX2575

## 2014-06-11 HISTORY — PX: CYSTOSCOPY W/ RETROGRADES: SHX1426

## 2014-06-11 LAB — CBC
HCT: 38.1 % (ref 36.0–46.0)
Hemoglobin: 10.7 g/dL — ABNORMAL LOW (ref 12.0–15.0)
MCH: 22.7 pg — ABNORMAL LOW (ref 26.0–34.0)
MCHC: 28.1 g/dL — AB (ref 30.0–36.0)
MCV: 80.9 fL (ref 78.0–100.0)
Platelets: 67 10*3/uL — ABNORMAL LOW (ref 150–400)
RBC: 4.71 MIL/uL (ref 3.87–5.11)
RDW: 26.4 % — AB (ref 11.5–15.5)
WBC: 3.8 10*3/uL — ABNORMAL LOW (ref 4.0–10.5)

## 2014-06-11 LAB — SURGICAL PCR SCREEN
MRSA, PCR: NEGATIVE
Staphylococcus aureus: NEGATIVE

## 2014-06-11 IMAGING — CR DG CHEST 1V PORT
1 series · 1 of 1 positions shown · non-contrast
Comparison: 01/28/2012

CLINICAL DATA: Cough and shortness of breath

PORTABLE CHEST - 1 VIEW

[AP]
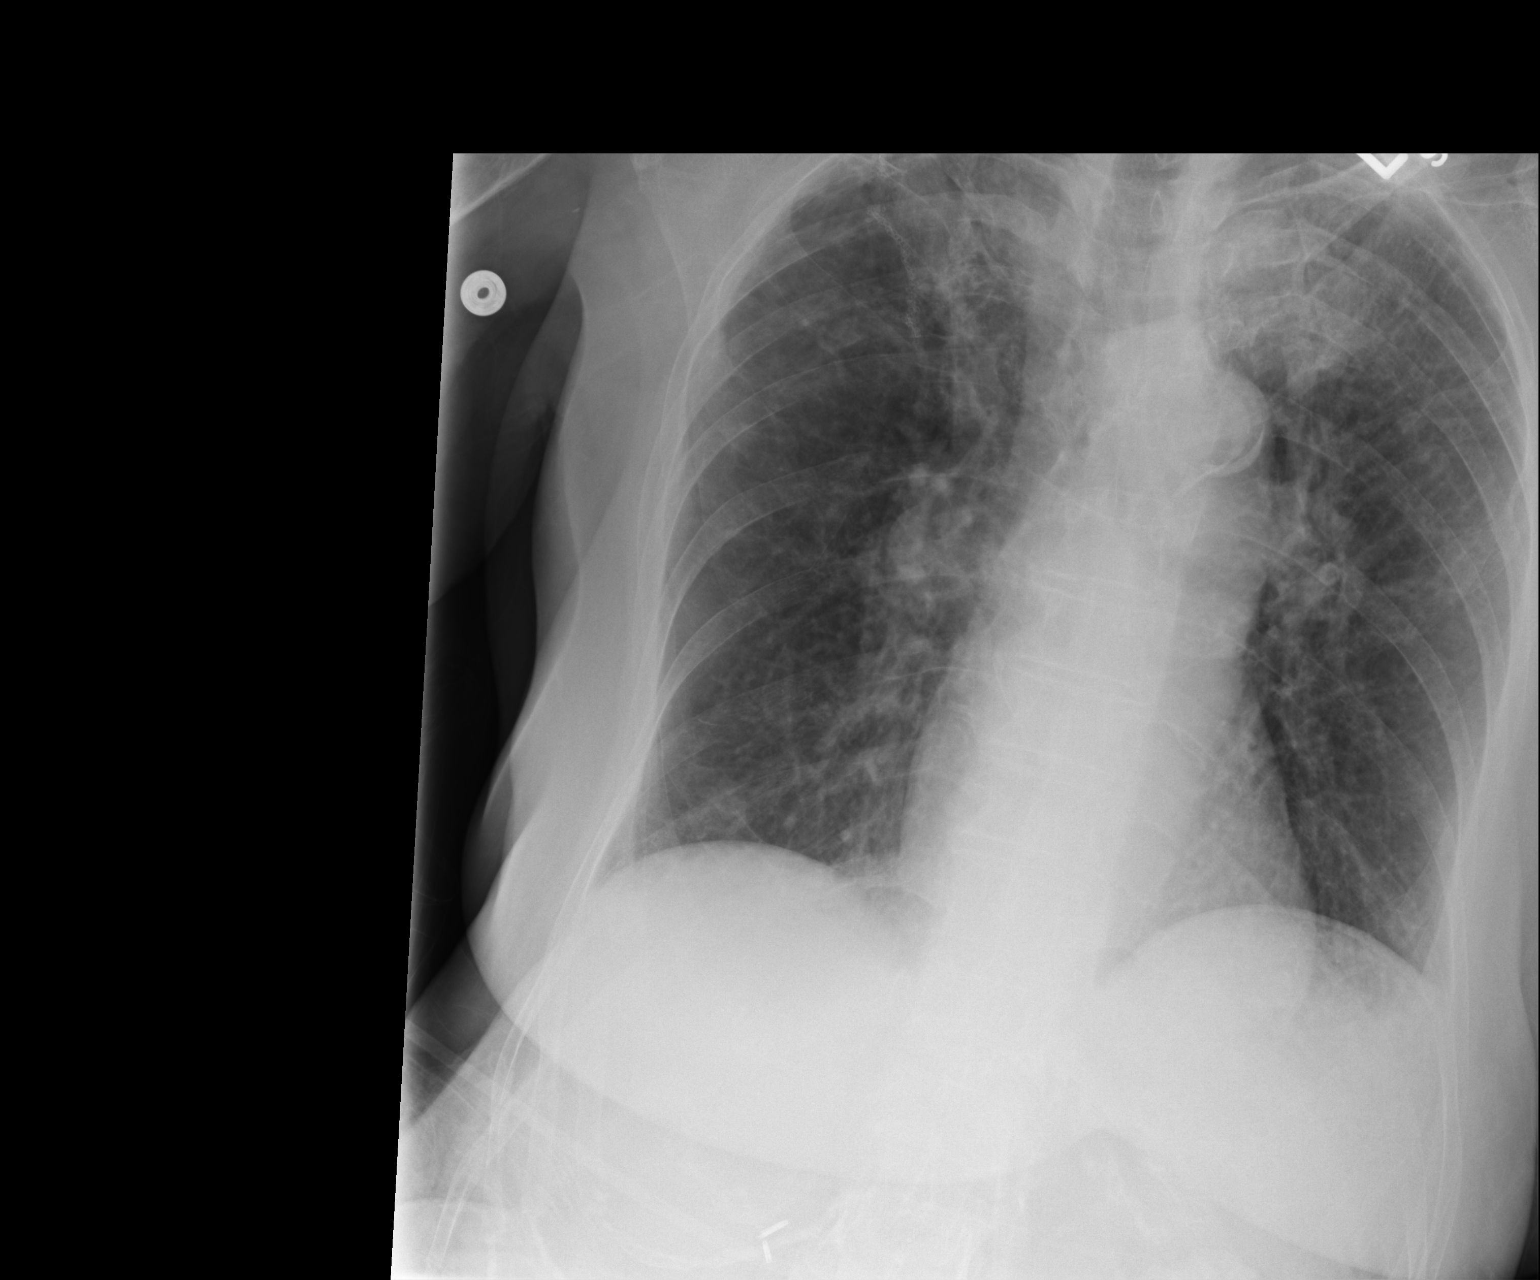

[1 of 1 positions shown; findings below may reference images not displayed]

FINDINGS: Chronic interstitial markings/emphysematous changes.
Biapical pleural parenchymal scarring.  Postsurgical changes in the
right upper lung.  Developing opacity/aspiration in the left lung
apex is possible, but is not convincing on the current study.

No pleural effusion or pneumothorax.

Cardiomediastinal silhouette is within normal limits.

Suspected nondisplaced right lateral rib fracture.
IMPRESSION: Suspected nondisplaced right lateral rib fracture.

Chronic interstitial markings/emphysematous changes with
postsurgical changes in the right upper lung.

Developing opacity/aspiration in the left lung apex is possible.

## 2014-06-11 IMAGING — CR DG FEMUR 2+V PORT*R*
2 series · 2 of 2 positions shown · non-contrast
Comparison: 01/28/2012.

CLINICAL DATA: Right intramedullary nail placement.  Evaluate
distal aspect of the nail.

PORTABLE RIGHT FEMUR - 2 VIEW

[xtable lateral]
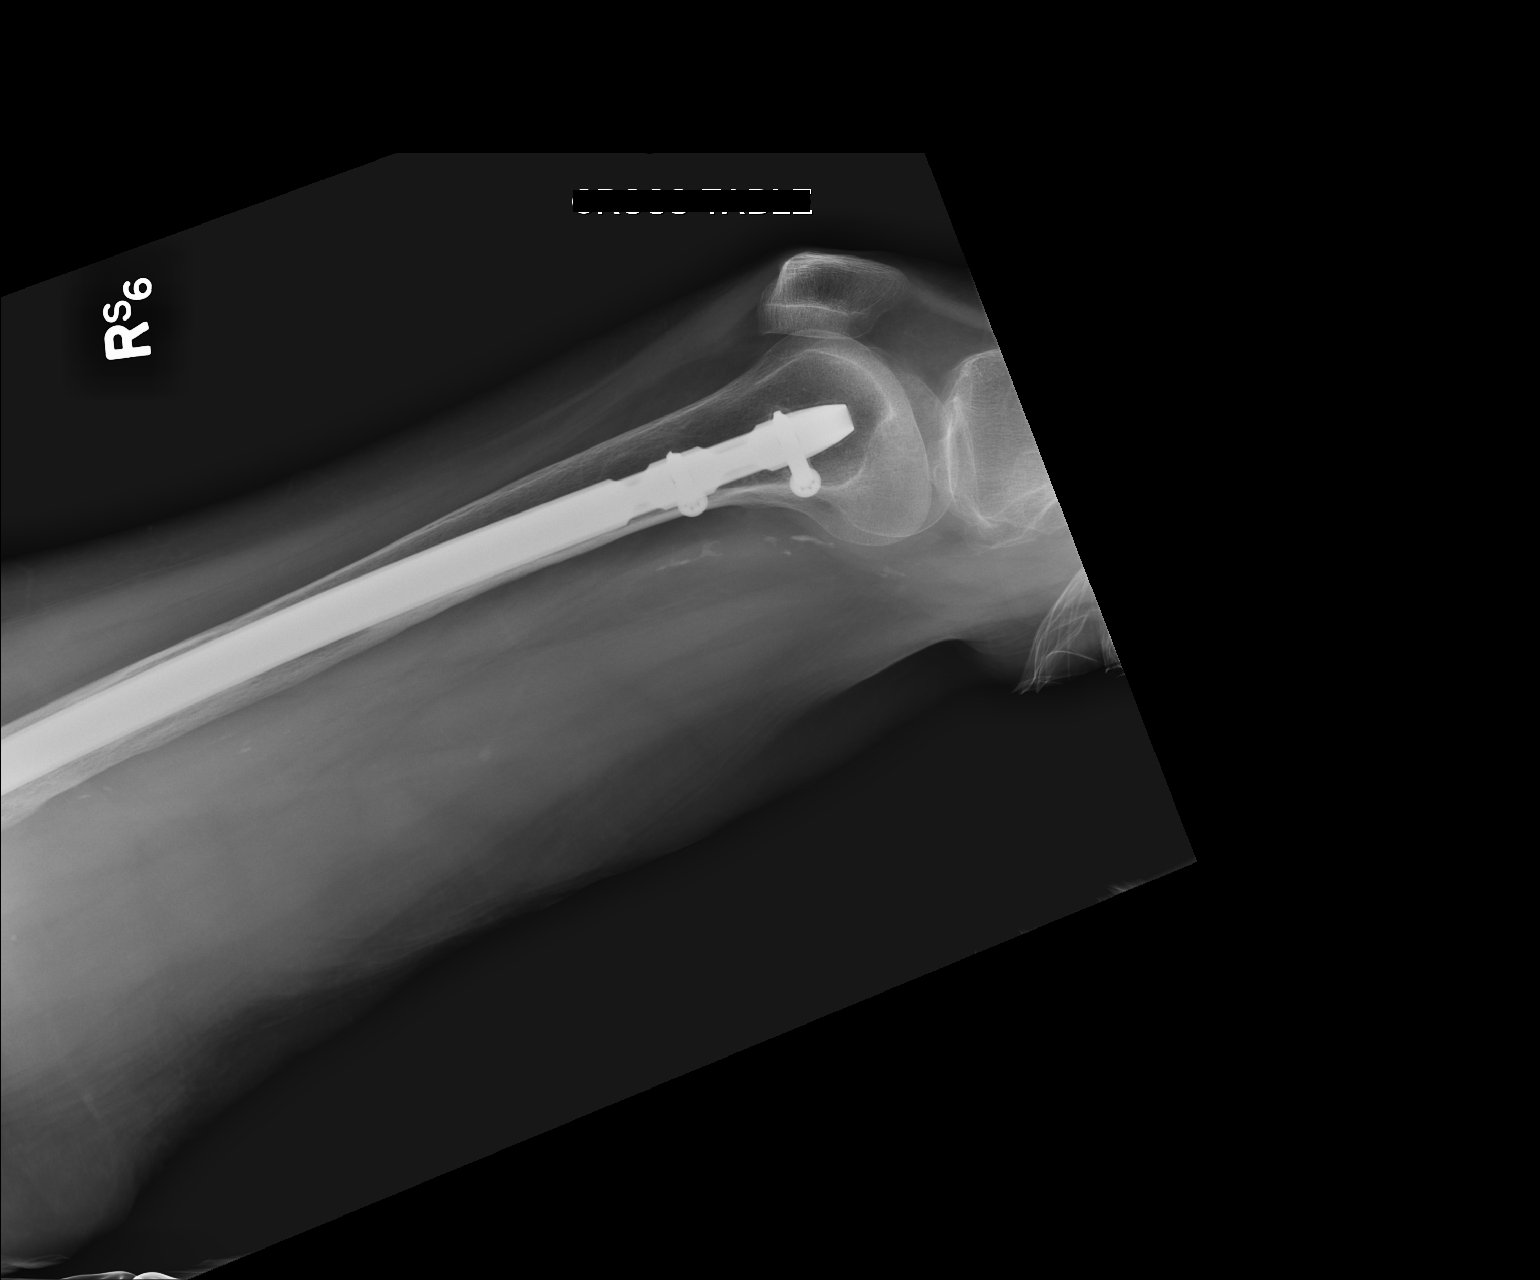

[AP]
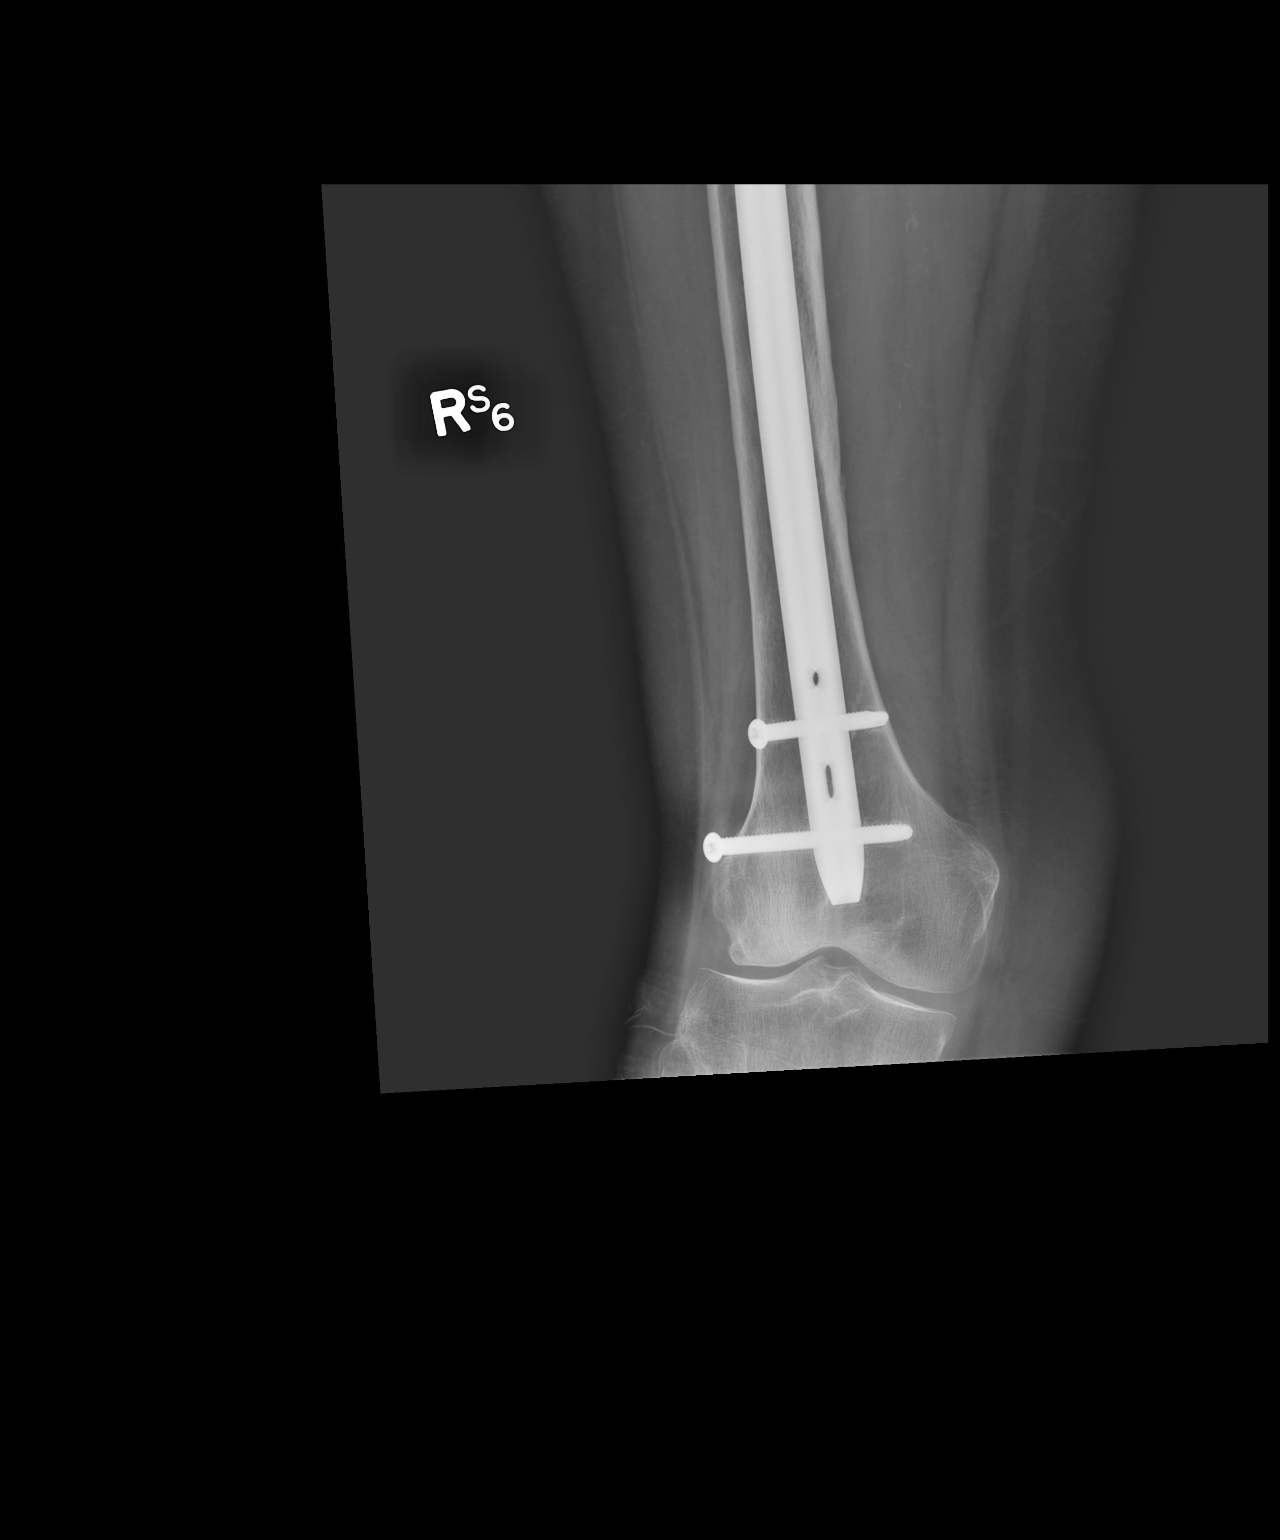

[2 of 2 positions shown; findings below may reference images not displayed]

FINDINGS: In conjunction with the prior radiographs, there is an
antegrade right femoral nail.  There are two distal interlocking
screws.  There is lucency around the inferior screw suggesting some
motion.  Atherosclerosis is present in the superficial femoral and
popliteal arteries.  There is no fracture of the distal femur.
Valgus deformity of the knee appears present.
IMPRESSION: Intramedullary nail with two distal interlocking screws.  Mild
loosening of the distal interlocking screw.

## 2014-06-11 SURGERY — TURBT (TRANSURETHRAL RESECTION OF BLADDER TUMOR)
Anesthesia: General

## 2014-06-11 MED ORDER — SODIUM CHLORIDE 0.9 % IR SOLN
Status: DC | PRN
  Administered 2014-06-11: 6000 mL via INTRAVESICAL

## 2014-06-11 MED ORDER — ROCURONIUM BROMIDE 100 MG/10ML IV SOLN
INTRAVENOUS | Status: DC | PRN
  Administered 2014-06-11: 25 mg via INTRAVENOUS

## 2014-06-11 MED ORDER — IOHEXOL 300 MG/ML  SOLN
INTRAMUSCULAR | Status: DC | PRN
  Administered 2014-06-11: 10 mL

## 2014-06-11 MED ORDER — GLYCOPYRROLATE 0.2 MG/ML IJ SOLN
INTRAMUSCULAR | Status: DC | PRN
  Administered 2014-06-11: 0.3 mg via INTRAVENOUS
  Administered 2014-06-11: .3 mg via INTRAVENOUS

## 2014-06-11 MED ORDER — LIDOCAINE HCL (CARDIAC) 20 MG/ML IV SOLN
INTRAVENOUS | Status: DC | PRN
  Administered 2014-06-11: 20 mg via INTRAVENOUS

## 2014-06-11 MED ORDER — MIDAZOLAM HCL 2 MG/2ML IJ SOLN
INTRAMUSCULAR | Status: DC | PRN
  Administered 2014-06-11: .25 mg via INTRAVENOUS

## 2014-06-11 MED ORDER — ONDANSETRON HCL 4 MG/2ML IJ SOLN
4.0000 mg | Freq: Once | INTRAMUSCULAR | Status: AC
  Administered 2014-06-11: 4 mg via INTRAVENOUS

## 2014-06-11 MED ORDER — PROPOFOL 10 MG/ML IV BOLUS
INTRAVENOUS | Status: DC | PRN
  Administered 2014-06-11: 50 mg via INTRAVENOUS

## 2014-06-11 MED ORDER — ONDANSETRON HCL 4 MG/2ML IJ SOLN
INTRAMUSCULAR | Status: AC
  Filled 2014-06-11: qty 2

## 2014-06-11 MED ORDER — LACTATED RINGERS IV SOLN
INTRAVENOUS | Status: DC | PRN
  Administered 2014-06-11: 10:00:00 via INTRAVENOUS

## 2014-06-11 MED ORDER — FENTANYL CITRATE 0.05 MG/ML IJ SOLN
INTRAMUSCULAR | Status: DC | PRN
  Administered 2014-06-11 (×3): 25 ug via INTRAVENOUS

## 2014-06-11 MED ORDER — LACTATED RINGERS IV SOLN
INTRAVENOUS | Status: DC
  Administered 2014-06-11: 12:00:00 via INTRAVENOUS
  Administered 2014-06-11: 1000 mL via INTRAVENOUS
  Administered 2014-06-11 – 2014-06-12 (×2): via INTRAVENOUS

## 2014-06-11 MED ORDER — NEOSTIGMINE METHYLSULFATE 10 MG/10ML IV SOLN
INTRAVENOUS | Status: DC | PRN
  Administered 2014-06-11 (×2): 2 mg via INTRAVENOUS

## 2014-06-11 MED ORDER — FENTANYL CITRATE 0.05 MG/ML IJ SOLN: 25.0000 ug | INTRAMUSCULAR | Status: DC | PRN

## 2014-06-11 SURGICAL SUPPLY — 32 items
BAG URINE DRAINAGE (UROLOGICAL SUPPLIES) ×8 IMPLANT
BAG URO CATCHER STRL LF (DRAPE) ×4 IMPLANT
BASKET LASER NITINOL 1.9FR (BASKET) IMPLANT
BASKET STNLS GEMINI 4WIRE 3FR (BASKET) IMPLANT
BASKET ZERO TIP NITINOL 2.4FR (BASKET) IMPLANT
CATH FOLEY 3WAY 30CC 22FR (CATHETERS) ×4 IMPLANT
CATH INTERMIT  6FR 70CM (CATHETERS) ×4 IMPLANT
CLOTH BEACON ORANGE TIMEOUT ST (SAFETY) ×4 IMPLANT
ELECT REM PT RETURN 9FT ADLT (ELECTROSURGICAL) ×4
ELECTRODE REM PT RTRN 9FT ADLT (ELECTROSURGICAL) ×2 IMPLANT
EVACUATOR MICROVAS BLADDER (UROLOGICAL SUPPLIES) IMPLANT
FIBER LASER FLEXIVA 1000 (UROLOGICAL SUPPLIES) IMPLANT
FIBER LASER FLEXIVA 200 (UROLOGICAL SUPPLIES) IMPLANT
FIBER LASER FLEXIVA 365 (UROLOGICAL SUPPLIES) IMPLANT
FIBER LASER FLEXIVA 550 (UROLOGICAL SUPPLIES) IMPLANT
FIBER LASER TRAC TIP (UROLOGICAL SUPPLIES) IMPLANT
GLOVE BIOGEL M STRL SZ7.5 (GLOVE) ×4 IMPLANT
GOWN STRL REUS W/TWL LRG LVL3 (GOWN DISPOSABLE) ×8 IMPLANT
GUIDEWIRE ANG ZIPWIRE 038X150 (WIRE) ×4 IMPLANT
GUIDEWIRE STR DUAL SENSOR (WIRE) ×4 IMPLANT
IV NS IRRIG 3000ML ARTHROMATIC (IV SOLUTION) ×4 IMPLANT
KIT ASPIRATION TUBING (SET/KITS/TRAYS/PACK) IMPLANT
LOOP CUT BIPOLAR 24F LRG (ELECTROSURGICAL) ×4 IMPLANT
MANIFOLD NEPTUNE II (INSTRUMENTS) ×4 IMPLANT
PACK CYSTO (CUSTOM PROCEDURE TRAY) ×4 IMPLANT
PLUG CATH AND CAP STER (CATHETERS) ×4 IMPLANT
SHIELD EYE BINOCULAR (MISCELLANEOUS) IMPLANT
SYRINGE 10CC LL (SYRINGE) IMPLANT
SYRINGE IRR TOOMEY STRL 70CC (SYRINGE) IMPLANT
TUBE FEEDING 8FR 16IN STR KANG (MISCELLANEOUS) ×4 IMPLANT
TUBING CONNECTING 10 (TUBING) ×3 IMPLANT
TUBING CONNECTING 10' (TUBING) ×1

## 2014-06-11 NOTE — Brief Op Note (Signed)
06/08/2014 - 06/11/2014  10:16 AM  PATIENT:  Claire Shown  74 y.o. female  PRE-OPERATIVE DIAGNOSIS:  BLADDER CANCER  POST-OPERATIVE DIAGNOSIS:  BLADDER CANCER  PROCEDURE:  Procedure(s): TRANSURETHRAL RESECTION OF BLADDER TUMOR (TURBT) (N/A) CYSTOSCOPY WITH RETROGRADE PYELOGRAM (Bilateral)  SURGEON:  Surgeon(s) and Role:    * [name], MD - Primary  PHYSICIAN ASSISTANT:   ASSISTANTS: none   ANESTHESIA:   general  EBL:     BLOOD ADMINISTERED:none  DRAINS: 53F 3 way foley to straight drain, irriation port plugged   LOCAL MEDICATIONS USED:  NONE  SPECIMEN:  Source of Specimen:  1- bladder lesion 2 -base of bladder lesion 3 - random bladder biopsy  DISPOSITION OF SPECIMEN:  PATHOLOGY  COUNTS:  YES  TOURNIQUET:  * No tourniquets in log *  DICTATION: .Other Dictation: Dictation Number [id number]  PLAN OF CARE: Admit to inpatient   PATIENT DISPOSITION:  PACU - hemodynamically stable.   Delay start of Pharmacological VTE agent (>24hrs) due to surgical blood loss or risk of bleeding: yes

## 2014-06-11 NOTE — Progress Notes (Signed)
Day of Surgery  Subjective:  1- Gross Hematuria with Anemia, Bladder Cancer - pt with recurrent gross hematura for several mos. >100PY smoker. On Xarelto at baseline for AFib (being held this hospitalization). Required transfusion last month. CT Hematuria protocol 05/2014 withtout worrisome renal masses, ? Posterior bladder mass v. Clot. UA/micro w/o sig pyuria or bacteruria. Bedside cysto 3/27 with multifocal small bladder lesions c/w likely carcinoma, clinically localized (one dome mass, few trigone lesions)  2 - Non-Complex Left Renal Cyst - 2cm left renal cyst w/o enhancement or calcification incidental on hematuria CT 2016.   PMH sig for MI, AFib/Xarelto, chole, hyst, ortho surgeries,  Today [name] [name] is seen stable. She in NPO for planned endoscopic resection of likely bladder cancer later this AM. She has been off Xarelto for at least 3 days.   Objective: Vital signs in last 24 hours: Temp:  [97.3 F (36.3 C)-98 F (36.7 C)] 97.3 F (36.3 C) (03/28 2052) Pulse Rate:  [63-72] 63 (03/28 2052) Resp:  [13-14] 13 (03/28 2052) BP: (116-137)/(30-50) 137/30 mmHg (03/28 2052) SpO2:  [96 %-100 %] 96 % (03/28 1656) Last BM Date: 06/10/14  Intake/Output from previous day: 03/28 0701 - 03/29 0700 In: 720 [P.O.:720] Out: 200 [Urine:200] Intake/Output this shift: Total I/O In: 120 [P.O.:120] Out: -   General appearance: alert, cooperative and appears stated age Eyes: negative Nose: Nares normal. Septum midline. Mucosa normal. No drainage or sinus tenderness. Throat: lips, mucosa, and tongue normal; teeth and gums normal Neck: supple, symmetrical, trachea midline Back: symmetric, no curvature. ROM normal. No CVA tenderness. Resp: non-labored on [place] O2 Cardio: Nl rate GI: soft, non-tender; bowel sounds normal; no masses,  no organomegaly Extremities: extremities normal, atraumatic, no cyanosis or edema Pulses: 2+ and symmetric Skin: Skin color, texture, turgor normal. No rashes or  lesions Lymph nodes: Cervical, supraclavicular, and axillary nodes normal. Neurologic: Grossly normal  Lab Results:   Recent Labs  06/09/14 0520 06/10/14 0800  WBC 3.6* 3.6*  HGB 10.8* 10.9*  HCT 37.6 38.4  PLT 76* 72*   BMET  Recent Labs  06/09/14 0520 06/10/14 0800  NA 140 136  K 4.1 4.5  CL 101 98  CO2 34* 28  GLUCOSE 94 175*  BUN 12 7  CREATININE 0.68 0.77  CALCIUM 8.9 8.7   PT/INR  Recent Labs  06/08/14 1201  LABPROT 16.5*  INR 1.32   ABG No results for input(s): PHART, HCO3 in the last 72 hours.  Invalid input(s): PCO2, PO2  Studies/Results: No results found.  Anti-infectives: Anti-infectives    Start     Dose/Rate Route Frequency Ordered Stop   06/11/14 0600  gentamicin (GARAMYCIN) 270 mg in dextrose 5 % 100 mL IVPB     5 mg/kg  54.4 kg 213.5 mL/hr over 30 Minutes Intravenous  Once 06/10/14 2346     06/10/14 2333  gentamicin (GARAMYCIN) IVPB 80 mg  Status:  Discontinued     80 mg 100 mL/hr over 30 Minutes Intravenous 30 min pre-op 06/10/14 2333 06/10/14 2341   06/08/14 1800  cefTRIAXone (ROCEPHIN) 1 g in dextrose 5 % 50 mL IVPB - Premix     1 g 100 mL/hr over 30 Minutes Intravenous Every 24 hours 06/08/14 1713        Assessment/Plan:  1- Gross Hematuria with Anemia, Bladder Cancer - OR today for endoscopic resection. Risks, benefits, alternatives discussed again.   2 - Non-Complex Left Renal Cyst - no indication for further evaluation or surveillance.   [name] [name]  06/11/2014  

## 2014-06-11 NOTE — Progress Notes (Signed)
Patient awake with strong grip, sustained head lift, spontaneous ventilation. Suctioned oropharynx and extubated to [place].  Good airway, pt comfortable with VSS.   Jenita Seashore, MD

## 2014-06-11 NOTE — Anesthesia Postprocedure Evaluation (Signed)
  Anesthesia Post-op Note  Patient: [name] [name]  Procedure(s) Performed: Procedure(s): TRANSURETHRAL RESECTION OF BLADDER TUMOR (TURBT) (N/A) CYSTOSCOPY WITH RETROGRADE PYELOGRAM (Bilateral)  Patient Location: PACU  Anesthesia Type:General  Level of Consciousness: awake, alert , oriented and patient cooperative  Airway and Oxygen Therapy: Patient Spontanous Breathing and Patient connected to nasal cannula oxygen  Post-op Pain: none  Post-op Assessment: Post-op Vital signs reviewed, Patient's Cardiovascular Status Stable, Respiratory Function Stable, Patent Airway, No signs of Nausea or vomiting and Pain level controlled  Post-op Vital Signs: Reviewed and stable  Last Vitals:  Filed Vitals:   06/11/14 1200  BP: 157/58  Pulse: 76  Temp: 36.7 C  Resp: 14    Complications: No apparent anesthesia complications, residual muscle relaxation (which required continued ventialtion into PACU) clinically resolved.  Etiology unclear.

## 2014-06-11 NOTE — Transfer of Care (Signed)
Immediate Anesthesia Transfer of Care Note  Patient: [name] [name]  Procedure(s) Performed: Procedure(s): TRANSURETHRAL RESECTION OF BLADDER TUMOR (TURBT) (N/A) CYSTOSCOPY WITH RETROGRADE PYELOGRAM (Bilateral)  Patient Location: PACU  Anesthesia Type:General  Level of Consciousness: unresponsive  Airway & Oxygen Therapy: Patient placed on Ventilator (see vital sign flow sheet for setting)  Post-op Assessment: Report given to RN and Post -op Vital signs reviewed and stable  Post vital signs: Reviewed and stable  Last Vitals:  Filed Vitals:   06/11/14 0844  BP: 145/46  Pulse: 68  Temp: 36.8 C  Resp: 14    Complications: respiratory complications

## 2014-06-11 NOTE — Progress Notes (Signed)
Patient ID: [name] [name], female   DOB: 28-Feb-1941, 74 y.o.   MRN: [id number]  [facility] PROGRESS NOTE  JALAYNA JOSTEN MAU:[id number] DOB: 1940-08-15 DOA: 06/08/2014 PCP: Leonides Sake, MD   Brief narrative:    74 yo female with COPD/chronic resp failure on 2L Home O2, Afib on xarelto, chronic hematuria suspected to be due to hemorrhagic cystitis presented to the ED with worsening hematuria. 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  - She's never had a colonoscopy before, needs non urgent GI eval as outpatient - Hg overall remains stable  - No active hematochezia seen now   Microcytic anemia, pancytopenia  - anemia panel with mild iron defi.  - ? Malignancy as contributor to pancytopenia - blood counts overall stable  - repeat CBC in AM  Paroxysmal atrial fibrillation - in NSR, continue to hold xarelto - continue dig, level normal  - continue diltiazem and low dose BB  COPD (chronic obstructive pulmonary disease):  - Stable,on 2L home O2  - symbicort and continue as needed bronchodilators  - quit smoking 7 months ago, >100pack year smoker  CAD (coronary artery disease):  - h/o remote MI, treated medically, no records of Cath or stress tests - last ECHO 2/16 with normal EF and wall motion - continue low dose toprol  DVT proph: SCDs  Code Status: full code Family Communication: brother  at bedside updated Disposition Plan: pending workup  IV access:  Peripheral IV  Procedures and diagnostic studies:    Ct Abdomen Pelvis W Contrast  06/08/2014  1. There is no acute inflammatory process within abdomen. 2. Status postcholecystectomy. Stable mild intra and extrahepatic biliary ductal dilatation. 3. Abundant stool noted in right colon and transverse colon. No small bowel or colonic obstruction. 4. No pericecal inflammation.  Unremarkable terminal ileum. 5. Moderate distended urinary bladder. Question small focal debris/clot within posterior aspect of the bladder. See axial image 62. Clinical correlation is necessary. 6. No evidence of colitis or diverticulitis. 7. Extensive atherosclerotic vascular calcifications. Again noted ectatic distal abdominal aorta appear    Medical Consultants:  Urology - Dr. [name]   Other Consultants:  None   IAnti-Infectives:   Rocephin 3/26 -->  [name], MD  [facility] Pager [phone]  If 7PM-7AM, please contact night-coverage [email or web address] Password [email or web address] 06/11/2014, 2:26 PM   LOS: 3 days   HPI/Subjective: No events overnight.   Objective: Filed Vitals:   06/11/14 1145 06/11/14 1200 06/11/14 1215 06/11/14 1230  BP: 162/60 157/58 124/99 133/47  Pulse: 74 76 82 73  Temp:  98.1 F (36.7 C) 97.7 F (36.5 C) 98 F (36.7 C)  TempSrc:      Resp: 14 14 19 16   Height:      Weight:      SpO2: 100% 100% 100% 97%    Intake/Output Summary (Last 24 hours) at 06/11/14 1426 Last data filed at 06/11/14 1222  Gross per 24 hour  Intake   1120 ml  Output    150 ml  Net    970 ml    Exam:   General:  Pt is alert, follows commands appropriately, not in acute distress  Cardiovascular: Regular rate and rhythm, no rubs, no gallops  Respiratory: Clear to auscultation bilaterally, no wheezing, no crackles, no rhonchi  Abdomen: Soft, non tender, non distended, bowel sounds present, no guarding   Data Reviewed: Basic Metabolic  Panel:  Recent Labs Lab 06/08/14 1319 06/09/14 0520 06/10/14 0800  NA 138 140 136  K 4.2 4.1 4.5  CL 95* 101 98  CO2 33* 34* 28  GLUCOSE 82 94 175*  BUN 23 12 7   CREATININE 0.79 0.68 0.77  CALCIUM 9.6 8.9 8.7   Liver Function Tests:  Recent Labs Lab 06/08/14 1319  AST 21  ALT 13  ALKPHOS 72  BILITOT 0.6  PROT 8.0  ALBUMIN 3.7   CBC:  Recent Labs Lab 06/08/14 1319 06/08/14 1845 06/09/14 0520 06/10/14 0800 06/11/14 0536  WBC 6.3 4.4 3.6* 3.6* 3.8*  NEUTROABS 4.2  --   --   --   --   HGB 13.1 11.0* 10.8* 10.9* 10.7*  HCT 43.4 38.6 37.6 38.4 38.1  MCV 77.9* 79.3 79.3 80.7 80.9  PLT 92* 63* 76* 72* 67*    Recent Results (from the past 240 hour(s))  Urine culture     Status: None   Collection Time: 06/08/14  1:12 PM  Result Value Ref Range Status   Special Requests NONE  Final    55,000 COLONIES/ML Performed at [place]   Final   Report Status 06/10/2014 FINAL  Final  MRSA PCR Screening     Status: None   Collection Time: 06/08/14  4:58 PM  Result Value Ref Range Status   MRSA by PCR NEGATIVE NEGATIVE Final  Surgical pcr screen     Status: None  Result Value Ref Range Status   Staphylococcus aureus NEGATIVE NEGATIVE Final     Scheduled Meds: . atorvastatin  40 mg Oral Daily  . budesonide-formoterol  2 puff Inhalation BID  . cefTRIAXone   IV  1 g Intravenous Q24H  . cyanocobalamin  1,000 mcg Oral Daily  . digoxin  0.125 mg Oral Daily  . diltiazem  240 mg Oral Daily  . furosemide  20 mg Oral Daily  . iron polysaccharides  150 mg Oral Daily  . metoprolol succinate  12.5 mg Oral Daily  . ondansetron      . oxybutynin  10 mg Oral QHS  . pantoprazole  40 mg Oral Daily  . polyethylene glycol  17 g Oral Daily  . sodium chloride  3 mL Intravenous Q12H  . tiotropium  18 mcg Inhalation Daily   Continuous Infusions: . lactated ringers 125 mL/hr at 06/11/14 1222

## 2014-06-11 NOTE — Anesthesia Preprocedure Evaluation (Addendum)
Anesthesia Evaluation  Patient identified by MRN, date of birth, ID band Patient awake    Reviewed: Allergy & Precautions, NPO status , Patient's Chart, lab work & pertinent test results, reviewed documented beta blocker date and time   History of Anesthesia Complications Negative for: history of anesthetic complications  Airway Mallampati: I  TM Distance: >3 FB Neck ROM: Full    Dental  (+) Edentulous Upper, Edentulous Lower   Pulmonary asthma , COPD COPD inhaler, former smoker (quit '15),  breath sounds clear to auscultation        Cardiovascular hypertension, Pt. on medications and Pt. on home beta blockers + CAD (?CAD, pt has never had cath or stress test) and + Past MI (? remote MI) + dysrhythmias (Dig, Cardizem, metoprolol, xarelto) Atrial Fibrillation Rhythm:Regular Rate:Normal  2/16 ECHO: EF 65-70%, mild AS, mild MR   Neuro/Psych negative neurological ROS     GI/Hepatic Neg liver ROS, GERD-  Medicated and Poorly Controlled,  Endo/Other  negative endocrine ROS  Renal/GU Renal InsufficiencyRenal disease Bladder dysfunction  Hematuria, bladder ca    Musculoskeletal  (+) Arthritis -, Osteoarthritis,    Abdominal   Peds  Hematology  (+) Blood dyscrasia (Hb 10.7, plt 67K), ,   Anesthesia Other Findings   Reproductive/Obstetrics                          Anesthesia Physical Anesthesia Plan  ASA: III  Anesthesia Plan: General   Post-op Pain Management:    Induction: Intravenous  Airway Management Planned: Oral ETT  Additional Equipment:   Intra-op Plan:   Post-operative Plan: Extubation in OR  Informed Consent: I have reviewed the patients History and Physical, chart, labs and discussed the procedure including the risks, benefits and alternatives for the proposed anesthesia with the patient or authorized representative who has indicated his/her understanding and acceptance.      Plan Discussed with: CRNA and Surgeon  Anesthesia Plan Comments: (Plan routine monitors, GETA)        Anesthesia Quick Evaluation

## 2014-06-12 ENCOUNTER — Encounter (HOSPITAL_COMMUNITY): Payer: Self-pay | Admitting: Urology

## 2014-06-12 LAB — CBC WITH DIFFERENTIAL/PLATELET
BASOS ABS: 0 10*3/uL (ref 0.0–0.1)
Basophils Relative: 0 % (ref 0–1)
Eosinophils Absolute: 0 10*3/uL (ref 0.0–0.7)
Eosinophils Relative: 1 % (ref 0–5)
HCT: 36.9 % (ref 36.0–46.0)
HEMOGLOBIN: 10.4 g/dL — AB (ref 12.0–15.0)
LYMPHS PCT: 33 % (ref 12–46)
Lymphs Abs: 1.4 10*3/uL (ref 0.7–4.0)
MCH: 23 pg — AB (ref 26.0–34.0)
MCHC: 28.2 g/dL — AB (ref 30.0–36.0)
MCV: 81.6 fL (ref 78.0–100.0)
MONO ABS: 0.7 10*3/uL (ref 0.1–1.0)
MONOS PCT: 16 % — AB (ref 3–12)
NEUTROS ABS: 2.2 10*3/uL (ref 1.7–7.7)
Neutrophils Relative %: 50 % (ref 43–77)
PLATELETS: 68 10*3/uL — AB (ref 150–400)
RBC: 4.52 MIL/uL (ref 3.87–5.11)
RDW: 26.4 % — ABNORMAL HIGH (ref 11.5–15.5)
WBC: 4.3 10*3/uL (ref 4.0–10.5)

## 2014-06-12 LAB — BASIC METABOLIC PANEL
ANION GAP: 5 (ref 5–15)
BUN: 9 mg/dL (ref 6–23)
CHLORIDE: 98 mmol/L (ref 96–112)
CO2: 36 mmol/L — AB (ref 19–32)
Calcium: 8.8 mg/dL (ref 8.4–10.5)
Creatinine, Ser: 0.68 mg/dL (ref 0.50–1.10)
GFR calc non Af Amer: 85 mL/min — ABNORMAL LOW (ref >90)
Glucose, Bld: 94 mg/dL (ref 70–99)
Potassium: 4.2 mmol/L (ref 3.5–5.1)
SODIUM: 139 mmol/L (ref 135–145)

## 2014-06-12 MED ORDER — BISACODYL 10 MG RE SUPP
10.0000 mg | Freq: Every day | RECTAL | Status: DC | PRN
  Administered 2014-06-12: 10 mg via RECTAL
  Filled 2014-06-12: qty 1

## 2014-06-12 MED ORDER — FLEET ENEMA 7-19 GM/118ML RE ENEM: 1.0000 | ENEMA | RECTAL | Status: DC | PRN

## 2014-06-12 MED ORDER — SENNOSIDES-DOCUSATE SODIUM 8.6-50 MG PO TABS
2.0000 | ORAL_TABLET | Freq: Every day | ORAL | Status: DC
  Administered 2014-06-12: 2 via ORAL
  Filled 2014-06-12: qty 2

## 2014-06-12 NOTE — Progress Notes (Signed)
Patient ID: [name] [name], female   DOB: Oct 12, 1940, 74 y.o.   MRN: [id number]  [facility] PROGRESS NOTE  [name] [name] TWS:[id number] DOB: Apr 17, 1940 DOA: 06/08/2014 PCP: Leonides Sake, MD   Brief narrative:    74 yo female with COPD/chronic resp failure on 2L Home O2, Afib on xarelto, chronic hematuria suspected to be due to hemorrhagic cystitis presented to the ED with worsening hematuria. 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   Hematochezia - Patient has microcytic anemia, and claims to have intermittent hematochezia over the past few months.  - She's never had a colonoscopy before, needs non urgent GI eval as outpatient - Hg overall remains stable  - No active hematochezia seen now   Microcytic anemia, pancytopenia  - anemia panel with mild iron defi.  - ? Malignancy as contributor to pancytopenia - blood counts overall stable  - repeat CBC in AM  Paroxysmal atrial fibrillation - in NSR, continue to hold xarelto - continue dig, level normal  - continue diltiazem and low dose BB  COPD (chronic obstructive pulmonary disease):  - Stable,on 2L home O2  - symbicort and continue as needed bronchodilators  - quit smoking 7 months ago, >100pack year smoker  CAD (coronary artery disease):  - h/o remote MI, treated medically, no records of Cath or stress tests - last ECHO 2/16  with normal EF and wall motion - continue low dose toprol  DVT proph: SCDs  Code Status: full code Family Communication: brother at bedside updated Disposition Plan: pending workup  IV access:  Peripheral IV  Procedures and diagnostic studies:    Ct Abdomen Pelvis W Contrast  06/08/2014  1. There is no acute inflammatory process within abdomen. 2. Status postcholecystectomy. Stable mild intra and extrahepatic biliary ductal dilatation. 3. Abundant stool noted in right colon and transverse colon. No small bowel or colonic obstruction. 4. No pericecal inflammation.  Unremarkable terminal ileum. 5. Moderate distended urinary bladder. Question small focal debris/clot within posterior aspect of the bladder. See axial image 62. Clinical correlation is necessary. 6. No evidence of colitis or diverticulitis. 7. Extensive atherosclerotic vascular calcifications. Again noted ectatic distal abdominal aorta appear    Medical Consultants:  Urology - Dr. [name]   Other Consultants:  None   IAnti-Infectives:   Rocephin 3/26 -->  [name] Ramsay, MD  [name] Pager [phone]  If 7PM-7AM, please contact night-coverage [email or web address] Password [email or web address] 06/12/2014, 11:30 AM   LOS: 4 days   HPI/Subjective: No events overnight.   Objective: Filed Vitals:   06/12/14 0204 06/12/14 0522 06/12/14 0820 06/12/14 0914  BP: 116/36 130/46    Pulse: 65 63  72  Temp: 98.2 F (36.8 C) 98 F (36.7 C)    TempSrc:  Oral    Resp: 16 16    Height:      Weight:      SpO2: 98% 95% 95%  Intake/Output Summary (Last 24 hours) at 06/12/14 1130 Last data filed at 06/12/14 0859  Gross per 24 hour  Intake 3274.16 ml  Output   4250 ml  Net -975.84 ml    Exam:   General:  Pt is alert, follows commands appropriately, not in acute distress  Cardiovascular: Regular rate and rhythm, no rubs, no gallops  Respiratory: Clear to auscultation bilaterally, no wheezing, no crackles, no rhonchi  Abdomen: Soft, non  tender, non distended, bowel sounds present, no guarding   Data Reviewed: Basic Metabolic Panel:  Recent Labs Lab 06/08/14 1319 06/09/14 0520 06/10/14 0800 06/12/14 0455  NA 138 140 136 139  K 4.2 4.1 4.5 4.2  CL 95* 101 98 98  CO2 33* 34* 28 36*  GLUCOSE 82 94 175* 94  BUN 23 12 7 9   CREATININE 0.79 0.68 0.77 0.68  CALCIUM 9.6 8.9 8.7 8.8   Liver Function Tests:  Recent Labs Lab 06/08/14 1319  AST 21  ALT 13  ALKPHOS 72  BILITOT 0.6  PROT 8.0  ALBUMIN 3.7   CBC:  Recent Labs Lab 06/08/14 1319 06/08/14 1845 06/09/14 0520 06/10/14 0800 06/11/14 0536 06/12/14 0455  WBC 6.3 4.4 3.6* 3.6* 3.8* 4.3  NEUTROABS 4.2  --   --   --   --  2.2  HGB 13.1 11.0* 10.8* 10.9* 10.7* 10.4*  HCT 43.4 38.6 37.6 38.4 38.1 36.9  MCV 77.9* 79.3 79.3 80.7 80.9 81.6  PLT 92* 63* 76* 72* 67* 68*    Recent Results (from the past 240 hour(s))  Urine culture     Status: None   Collection Time: 06/08/14  1:12 PM  Result Value Ref Range Status   Special Requests NONE  Final    55,000 COLONIES/ML Performed at [place]   Final   Report Status 06/10/2014 FINAL  Final  MRSA PCR Screening     Status: None   Collection Time: 06/08/14  4:58 PM  Result Value Ref Range Status   MRSA by PCR NEGATIVE NEGATIVE Final  Surgical pcr screen     Status: None  Result Value Ref Range Status   Staphylococcus aureus NEGATIVE NEGATIVE Final     Scheduled Meds: . atorvastatin  40 mg Oral Daily  . budesonide-formoterol  2 puff Inhalation BID  . cefTRIAXone   IV  1 g Intravenous Q24H  . cyanocobalamin  1,000 mcg Oral Daily  . digoxin  0.125 mg Oral Daily  . diltiazem  240 mg Oral Daily  . furosemide  20 mg Oral Daily  . iron polysaccharides  150 mg Oral Daily  . metoprolol succinate  12.5 mg Oral Daily  . ondansetron      . oxybutynin  10 mg Oral QHS  . pantoprazole  40 mg Oral Daily  . polyethylene glycol  17 g Oral Daily  . sodium chloride  3 mL Intravenous Q12H   . tiotropium  18 mcg Inhalation Daily   Continuous Infusions:

## 2014-06-12 NOTE — Progress Notes (Signed)
1 Day Post-Op  Subjective:  1- Gross Hematuria with Anemia, Probable Bladder Cancer - pt with recurrent gross hematura for several mos. >100PY smoker. On Xarelto at baseline for AFib (being held this hospitalization). Required transfusion last month. CT Hematuria protocol 05/2014 withtout worrisome renal masses, ? Posterior bladder mass v. Clot. UA/micro w/o sig pyuria or bacteruria. Bedside cysto 3/27 with multifocal small bladder lesions c/w likely carcinoma, clinically localized (one dome mass, few trigone lesions). 3/29 transurethral resection in OR, path pending.   2 - Non-Complex Left Renal Cyst - 2cm left renal cyst w/o enhancement or calcification incidental on hematuria CT 2016.   PMH sig for MI, AFib/Xarelto, chole, hyst, ortho surgeries,  Today Ellasyn is seen stable. No events or catheter problems overnight.   Objective: Vital signs in last 24 hours: Temp:  [97.2 F (36.2 C)-98.3 F (36.8 C)] 98 F (36.7 C) (03/30 0522) Pulse Rate:  [63-86] 63 (03/30 0522) Resp:  [13-19] 16 (03/30 0522) BP: (116-162)/(32-99) 130/46 mmHg (03/30 0522) SpO2:  [95 %-100 %] 95 % (03/30 0522) FiO2 (%):  [40 %] 40 % (03/29 1208) Last BM Date: 06/10/14  Intake/Output from previous day: 03/29 0701 - 03/30 0700 In: 3734.2 [P.O.:480; I.V.:3204.2; IV Piggyback:50] Out: 3800 [Urine:3800] Intake/Output this shift:    General appearance: alert, cooperative and appears older than stated age Head: Normocephalic, without obvious abnormality, atraumatic Nose: Nares normal. Septum midline. Mucosa normal. No drainage or sinus tenderness. Throat: lips, mucosa, and tongue normal; teeth and gums normal Neck: supple, symmetrical, trachea midline Back: symmetric, no curvature. ROM normal. No CVA tenderness. Resp: non-labored on [place] O2 Cardio: Nl rate GI: soft, non-tender; bowel sounds normal; no masses,  no organomegaly Pelvic: external genitalia normal Extremities: extremities normal, atraumatic, no cyanosis  or edema Skin: Skin color, texture, turgor normal. No rashes or lesions Lymph nodes: Cervical, supraclavicular, and axillary nodes normal. Neurologic: Grossly normal  Lab Results:   Recent Labs  06/11/14 0536 06/12/14 0455  WBC 3.8* 4.3  HGB 10.7* 10.4*  HCT 38.1 36.9  PLT 67* 68*   BMET  Recent Labs  06/10/14 0800 06/12/14 0455  NA 136 139  K 4.5 4.2  CL 98 98  CO2 28 36*  GLUCOSE 175* 94  BUN 7 9  CREATININE 0.77 0.68  CALCIUM 8.7 8.8   PT/INR No results for input(s): LABPROT, INR in the last 72 hours. ABG No results for input(s): PHART, HCO3 in the last 72 hours.  Invalid input(s): PCO2, PO2  Studies/Results: No results found.  Anti-infectives: Anti-infectives    Start     Dose/Rate Route Frequency Ordered Stop   06/11/14 0600  gentamicin (GARAMYCIN) 270 mg in dextrose 5 % 100 mL IVPB     5 mg/kg  54.4 kg 213.5 mL/hr over 30 Minutes Intravenous  Once 06/10/14 2346 06/11/14 0936   06/10/14 2333  gentamicin (GARAMYCIN) IVPB 80 mg  Status:  Discontinued     80 mg 100 mL/hr over 30 Minutes Intravenous 30 min pre-op 06/10/14 2333 06/10/14 2341   06/08/14 1800  cefTRIAXone (ROCEPHIN) 1 g in dextrose 5 % 50 mL IVPB - Premix     1 g 100 mL/hr over 30 Minutes Intravenous Every 24 hours 06/08/14 1713        Assessment/Plan:  1- Gross Hematuria with Anemia, Probable Bladder Cancer - path pending. Foley can come out tomorrow (3/31) AM. Further management can be as outpatient. WE will call her with path results when available.   2 - Non-Complex  Left Renal Cyst - no indication for further evaluation or surveillance.   I will be out of town starting tomorrow (3/31), my on-call partners will be available if acute issues arise. I am available by phone anytime.   [facility], Dorethy Tomey 06/12/2014

## 2014-06-13 ENCOUNTER — Encounter: Payer: Self-pay | Admitting: *Deleted

## 2014-06-13 DIAGNOSIS — R310 Gross hematuria: Secondary | ICD-10-CM

## 2014-06-13 LAB — CBC
HEMATOCRIT: 38.7 % (ref 36.0–46.0)
HEMOGLOBIN: 10.9 g/dL — AB (ref 12.0–15.0)
MCH: 23 pg — AB (ref 26.0–34.0)
MCHC: 28.2 g/dL — AB (ref 30.0–36.0)
MCV: 81.8 fL (ref 78.0–100.0)
Platelets: 78 10*3/uL — ABNORMAL LOW (ref 150–400)
RBC: 4.73 MIL/uL (ref 3.87–5.11)
RDW: 25.9 % — ABNORMAL HIGH (ref 11.5–15.5)
WBC: 4.7 10*3/uL (ref 4.0–10.5)

## 2014-06-13 LAB — BASIC METABOLIC PANEL
Anion gap: 5 (ref 5–15)
BUN: 16 mg/dL (ref 6–23)
CHLORIDE: 98 mmol/L (ref 96–112)
CO2: 37 mmol/L — ABNORMAL HIGH (ref 19–32)
CREATININE: 0.58 mg/dL (ref 0.50–1.10)
Calcium: 8.9 mg/dL (ref 8.4–10.5)
GFR calc non Af Amer: 89 mL/min — ABNORMAL LOW (ref >90)
Glucose, Bld: 95 mg/dL (ref 70–99)
Potassium: 3.9 mmol/L (ref 3.5–5.1)
Sodium: 140 mmol/L (ref 135–145)

## 2014-06-13 IMAGING — CR DG CHEST 2V
2 series · 2 of 2 positions shown · non-contrast
Comparison: 01/29/2012 and earlier.

CLINICAL DATA: 71-year-old female status post left hip surgery.
Shortness of breath.  History of fall with rib fractures.

CHEST - 2 VIEW

[w chest pa]
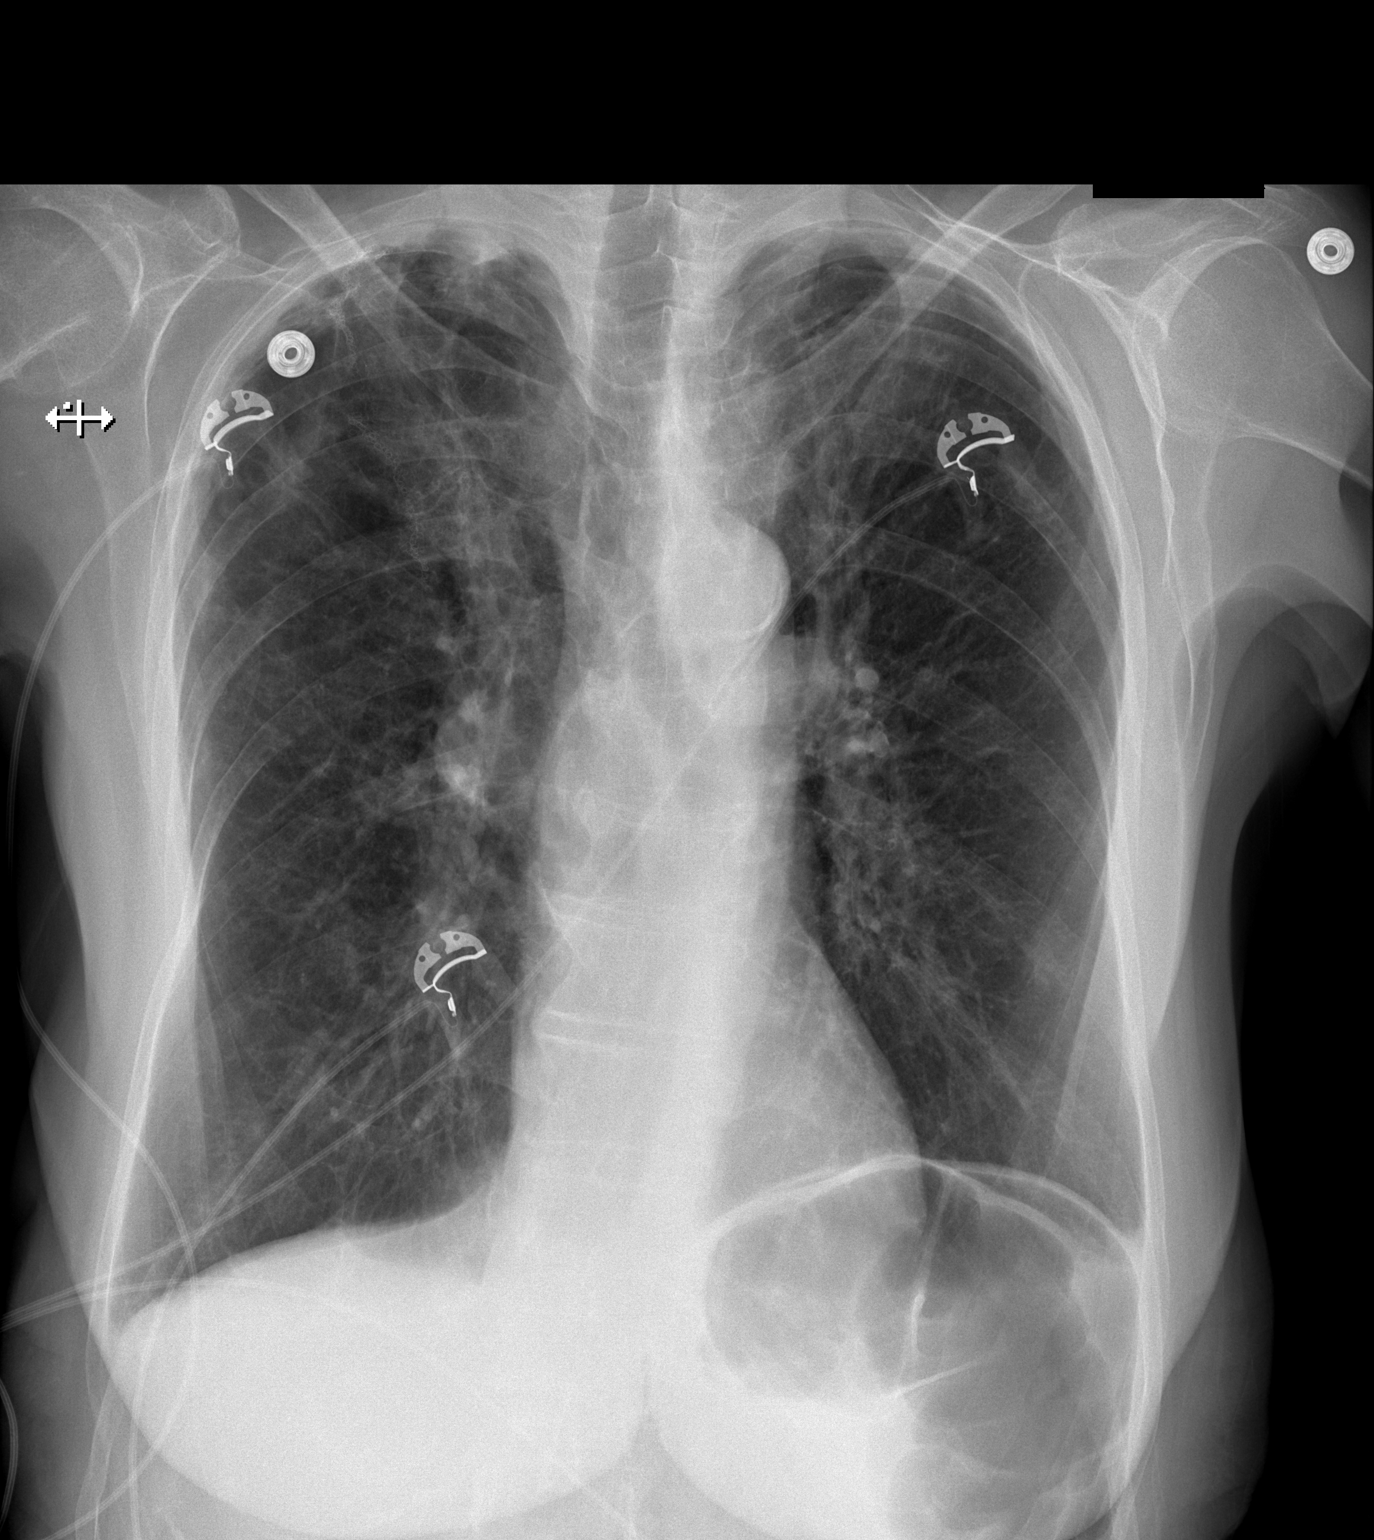

[w chest lat]
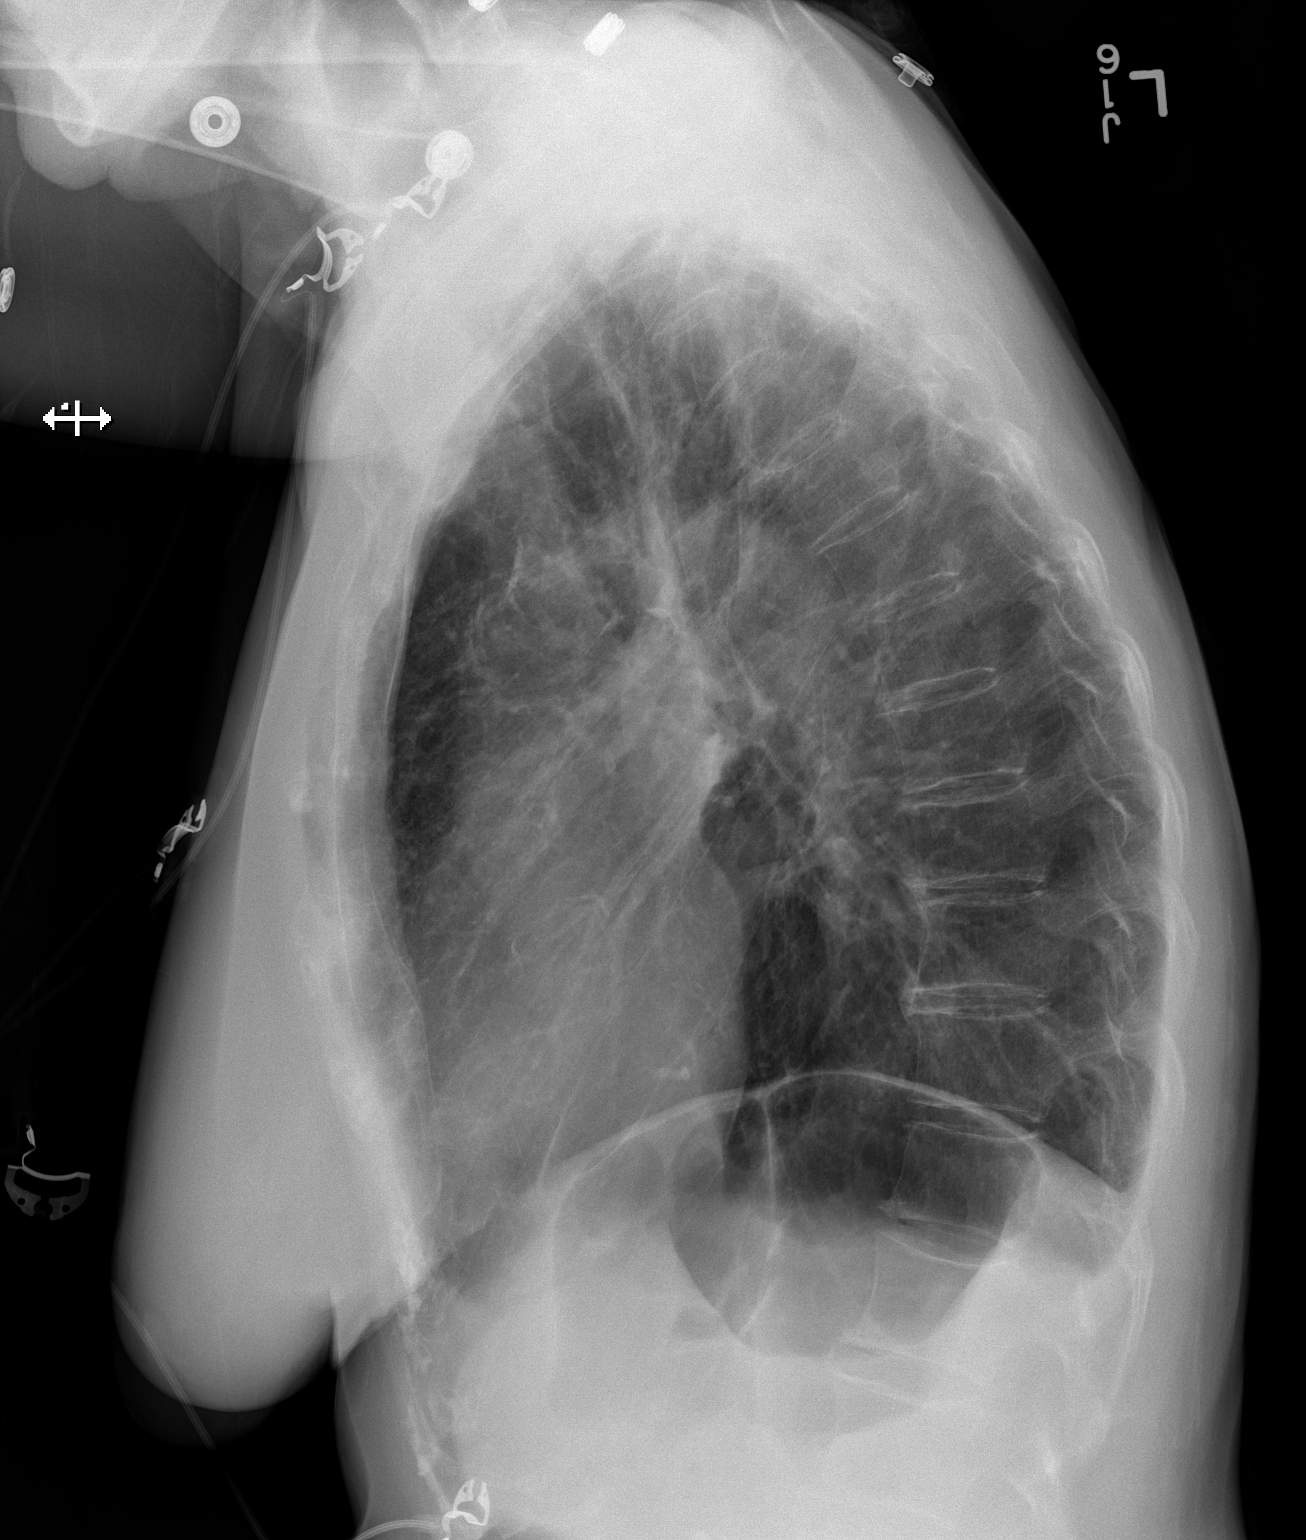

[2 of 2 positions shown; findings below may reference images not displayed]

FINDINGS: Stable large lung volumes.  Chronic postoperative changes
to the right lung apex with scarring and staple line.  No
pneumothorax.  No pleural effusion.  No pulmonary edema.  No acute
pulmonary opacity; left upper lobe opacity no longer present.
Stable cardiac size and mediastinal contours.  Visualized tracheal
air column is within normal limits.  Osteopenia. Stable visualized
osseous structures.
IMPRESSION: Stable hyperinflation and chronic lung changes.  No acute
cardiopulmonary abnormality.  Recent right fifth and sixth rib
fractures better demonstrated on 01/28/2012.

## 2014-06-13 MED ORDER — FUROSEMIDE 20 MG PO TABS: 20.0000 mg | ORAL_TABLET | Freq: Every day | ORAL | Status: DC

## 2014-06-13 NOTE — Discharge Summary (Signed)
Physician Discharge Summary  [name] [name] XKG:[id number] DOB: 04-18-1940 DOA: 06/08/2014  PCP: Leonides Sake, MD  Admit date: 06/08/2014 Discharge date: 06/13/2014  Recommendations for Outpatient Follow-up:  1. Pt will need to follow up with PCP in 2 weeks post discharge 2. Please obtain BMP to evaluate electrolytes and kidney function  Discharge Diagnoses:  Active Problems:   CAD (coronary artery disease)   COPD (chronic obstructive pulmonary disease)   Thrombocytopenia   Paroxysmal atrial fibrillation   Microcytic anemia   Cystitis, acute hemorrhagic   Hematochezia   Hematuria   Discharge Condition: Stable  Diet recommendation: Heart healthy diet discussed in details   Brief narrative:    74 yo female with COPD/chronic resp failure on 2L Home O2, Afib on xarelto, chronic hematuria suspected to be due to hemorrhagic cystitis presented to the ED with worsening hematuria. 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, Tx to [facility] for transurethral rescetion  Assessment/Plan:    Gross hematuria - no signs of malignancy  - s/p bedside cystoscopy per [facility] 3/27, s/p TUR for tissue diagnosis 3/29, pt tolerated well - pt has been on rocephin since 3/26, urine cultures with multiple bact sp, no need to repeat urine cultures as pt already on ABX and cultures will likely be negative  - Xarelto resumed as no addition hematuria reported  - Hg has remained stable over the past 48 hours   Non-Complex Left Renal Cyst  - 2cm left renal cyst w/o enhancement or calcification incidental on CT 2016.   Hematochezia - Patient has microcytic anemia, and claims to have intermittent hematochezia over the past few months.  - She's never had a colonoscopy before, needs non urgent GI eval as outpatient - Hg overall remains stable  - No active hematochezia reported inpatient   Microcytic anemia, pancytopenia  - anemia panel with mild iron defi.  - blood counts  overall stable, WBC now WNL and Plt trending up   Paroxysmal atrial fibrillation - in NSR, continue xarelto upon discharge  - continue dig, level normal  - continue diltiazem and low dose BB  COPD (chronic obstructive pulmonary disease):  - Stable,on 2L home O2  - symbicort and continue as needed bronchodilators  - quit smoking 7 months ago, >100 pack year smoker  CAD (coronary artery disease):  - h/o remote MI, treated medically, no records of Cath or stress tests - last ECHO 2/16 with normal EF and wall motion - continue low dose toprol  Code Status: full code Family Communication: brother at bedside updated Disposition Plan: home with [facility] PT  IV access:  Peripheral IV  Procedures and diagnostic studies:   Ct Abdomen Pelvis W Contrast 06/08/2014 1. There is no acute inflammatory process within abdomen. 2. Status postcholecystectomy. Stable mild intra and extrahepatic biliary ductal dilatation. 3. Abundant stool noted in right colon and transverse colon. No small bowel or colonic obstruction. 4. No pericecal inflammation. Unremarkable terminal ileum. 5. Moderate distended urinary bladder. Question small focal debris/clot within posterior aspect of the bladder. See axial image 62. Clinical correlation is necessary. 6. No evidence of colitis or diverticulitis. 7. Extensive atherosclerotic vascular calcifications. Again noted ectatic distal abdominal aorta appear   Medical Consultants:  Urology - Dr. [name]   Other Consultants:  None   IAnti-Infectives:   Rocephin 3/26 --> 3/31      Discharge Exam: Filed Vitals:   06/13/14 0539  BP: 134/42  Pulse: 66  Temp: 97.8 F (36.6 C)  Resp: 18  Filed Vitals:   06/12/14 2036 06/12/14 2113 06/13/14 0539 06/13/14 0741  BP: 125/42  134/42   Pulse: 65  66   Temp: 97.8 F (36.6 C)  97.8 F (36.6 C)   TempSrc: Oral  Oral   Resp: 18  18   Height:      Weight:      SpO2: 96% 96% 97% 94%    General: Pt is  alert, follows commands appropriately, not in acute distress Cardiovascular: Regular rate and rhythm, no rubs, no gallops Respiratory: Clear to auscultation bilaterally, no wheezing, no crackles, no rhonchi Abdominal: Soft, non tender, non distended, bowel sounds +, no guarding   Discharge Instructions  Discharge Instructions    Diet - low sodium heart healthy    Complete by:  As directed      Increase activity slowly    Complete by:  As directed             Medication List    TAKE these medications        VENTOLIN HFA 108 (90 BASE) MCG/ACT inhaler  Generic drug:  albuterol  Inhale 2 puffs into the lungs 4 (four) times daily as needed. wheezing     albuterol (2.5 MG/3ML) 0.083% nebulizer solution  Commonly known as:  PROVENTIL  Take 2.5 mg by nebulization every 6 (six) hours.     atorvastatin 40 MG tablet  Commonly known as:  LIPITOR  Take 40 mg by mouth daily.     budesonide-formoterol 160-4.5 MCG/ACT inhaler  Commonly known as:  SYMBICORT  Inhale 2 puffs into the lungs 2 (two) times daily.     digoxin 0.25 MG tablet  Commonly known as:  LANOXIN  Take 0.125 mg by mouth daily.     diltiazem 240 MG 24 hr capsule  Commonly known as:  CARDIZEM CD  Take 240 mg by mouth daily.     docusate sodium 100 MG capsule  Commonly known as:  COLACE  Take 100 mg by mouth daily as needed (constipation).     Fish Oil 1200 MG Caps  Take 1,200 mg by mouth daily.     fluticasone 50 MCG/ACT nasal spray  Commonly known as:  FLONASE  Place 1 spray into both nostrils daily as needed for allergies or rhinitis.     furosemide 20 MG tablet  Commonly known as:  LASIX  Take 1 tablet (20 mg total) by mouth daily.     iron polysaccharides 150 MG capsule  Commonly known as:  NIFEREX  Take 1 capsule (150 mg total) by mouth daily.     loperamide 2 MG capsule  Commonly known as:  IMODIUM  Take 2 mg by mouth 2 (two) times daily as needed for diarrhea or loose stools.     metoprolol  succinate 50 MG 24 hr tablet  Commonly known as:  TOPROL-XL  Take 12.5 mg by mouth daily. Take with or immediately following a meal.     OVER THE COUNTER MEDICATION  Take 1 tablet by mouth at bedtime. Hyland's Leg Cramps PM for sleep and leg cramps     oxybutynin 5 MG 24 hr tablet  Commonly known as:  DITROPAN-XL  Take 10 mg by mouth at bedtime.     pantoprazole 40 MG tablet  Commonly known as:  PROTONIX  Take 40 mg by mouth daily.     rivaroxaban 20 MG Tabs tablet  Commonly known as:  XARELTO  Take 20 mg by mouth daily with supper.  tiotropium 18 MCG inhalation capsule  Commonly known as:  SPIRIVA  Place 1 capsule (18 mcg total) into inhaler and inhale daily.     traMADol 50 MG tablet  Commonly known as:  ULTRAM  Take 50-100 mg by mouth 2 (two) times daily as needed (pain).     vitamin B-12 500 MCG tablet  Commonly known as:  CYANOCOBALAMIN  Take 1,000 mcg by mouth daily.           Follow-up Information    Follow up with Leonides Sake, MD.   Specialty:  Family Medicine   Contact information:   Dr. Daiva Eves [address] [place] [address] [phone]       The results of significant diagnostics from this hospitalization (including imaging, microbiology, ancillary and laboratory) are listed below for reference.     Microbiology: Recent Results (from the past 240 hour(s))  Urine culture     Status: None   Collection Time: 06/08/14  1:12 PM  Result Value Ref Range Status   Specimen Description URINE, CLEAN CATCH  Final   Special Requests NONE  Final   Colony Count   Final    55,000 COLONIES/ML Performed at [facility]    Culture   Final       Report Status 06/10/2014 FINAL  Final  MRSA PCR Screening     Status: None   Collection Time: 06/08/14  4:58 PM  Result Value Ref Range Status   MRSA by PCR NEGATIVE NEGATIVE Final  Surgical pcr screen     Status: None   Collection Time: 06/11/14  8:50 AM  Result Value Ref Range  Status   MRSA, PCR NEGATIVE NEGATIVE Final   Staphylococcus aureus NEGATIVE NEGATIVE Final    Labs: Basic Metabolic Panel:  Recent Labs Lab 06/08/14 1319 06/09/14 0520 06/10/14 0800 06/12/14 0455 06/13/14 0502  NA 138 140 136 139 140  K 4.2 4.1 4.5 4.2 3.9  CL 95* 101 98 98 98  CO2 33* 34* 28 36* 37*  GLUCOSE 82 94 175* 94 95  BUN 23 12 7 9 16   CREATININE 0.79 0.68 0.77 0.68 0.58  CALCIUM 9.6 8.9 8.7 8.8 8.9   Liver Function Tests:  Recent Labs Lab 06/08/14 1319  AST 21  ALT 13  ALKPHOS 72  BILITOT 0.6  PROT 8.0  ALBUMIN 3.7   CBC:  Recent Labs Lab 06/08/14 1319  06/09/14 0520 06/10/14 0800 06/11/14 0536 06/12/14 0455 06/13/14 0502  WBC 6.3  < > 3.6* 3.6* 3.8* 4.3 4.7  NEUTROABS 4.2  --   --   --   --  2.2  --   HGB 13.1  < > 10.8* 10.9* 10.7* 10.4* 10.9*  HCT 43.4  < > 37.6 38.4 38.1 36.9 38.7  MCV 77.9*  < > 79.3 80.7 80.9 81.6 81.8  PLT 92*  < > 76* 72* 67* 68* 78*  < > = values in this interval not displayed.  BNP (last 3 results)  Recent Labs  04/27/14 2120  BNP 109.9*   ProBNP (last 3 results)  Recent Labs  09/24/13 1028 09/26/13 1458  PROBNP 809.3* 742.8*   SIGNED: Time coordinating discharge: Over 30 minutes  [name], MD  [facility] 06/13/2014, 9:39 AM Pager [phone]  If 7PM-7AM, please contact night-coverage [email or web address] Password [email or web address]

## 2014-06-13 NOTE — Op Note (Signed)
NAMEPROVIDENCE, STIVERS NO.:  [id number]  MEDICAL RECORD NO.:  [id number]  LOCATION:  PERIO                        FACILITY:  [facility]  PHYSICIAN:  [name], MD     DATE OF BIRTH:  10/25/1940  DATE OF PROCEDURE:  06/11/2014                               OPERATIVE REPORT   DIAGNOSIS:  Recurrent gross hematuria with anemia and small bladder lesions worrisome for carcinoma.  PROCEDURE: 1. Transurethral resection of bladder tumor, volume small. 2. Bilateral retrograde pyelogram interpretation.  SURGEON:  [name], MD  ESTIMATED BLOOD LOSS:  Nil.  COMPLICATIONS:  None.  SPECIMEN: 1. Bladder lesion fragments to permanent pathology. 2. Base of bladder lesion. 3. Random bladder biopsy to permanent  pathology.  CATHETER:  A 22-French 3-Foley catheter to straight drain, 10 mL of sterile water in the balloon, irrigation port was plugged.  INDICATION:  [name] [name] is a very pleasant 74 year old lady with extensive smoking history as well as recurrent gross hematuria, transfusion-dependent.  She had recently been admitted to the hospitalist service with recurrent gross hematuria.  It was certainly concerning for possible urologic malignancy.  She underwent imaging with CT scan that was relatively unremarkable which revealed some form of clot in the urinary bladder.  Bedside cystoscopy revealed several abnormal areas of the bladder, worrisome for possible early urothelial carcinoma.  Options were discussed including transurethral resection and biopsy for tissue diagnosis and staging, and she wished to proceed. Informed consent was obtained and placed in medical record.  The patient has been on Xarelto, she has been off this for at least 3 days.  PROCEDURE IN DETAIL:  The patient being [name] [name] verified, procedure being transurethral resection of bladder tumor was confirmed. Procedure was carried out.  Time-out was performed.  Intravenous antibiotics  were administered.  General endotracheal anesthesia was induced.  The patient was placed into a low lithotomy position, and sterile field was created by prepping and draping of the patient's vagina, introitus, and proximal thighs using iodine x3.  Next, cystourethroscopy was performed using a 23-French rigid cystoscope with 30-degrees offset lens .  Inspection of the urinary bladder revealed no diverticula or calcifications.  There was 1 dominant small bladder lesion approximately 3 cm in diameter in the posterior wall with some raised edges.  This was not over a papillary.  There was also some patchy erythema in the urinary bladder as well.  There was worrisome for possible early carcinoma and carcinoma in situ.  Ureteral orifices were in the normal anatomic position.  Attention was directed to retrograde pyelography.  The left ureteral orifice was cannulated with a 6-French end-hole catheter and left retrograde pyelogram was obtained.  Left retrograde pyelogram demonstrated single left ureter, single system left kidney.  No filling defects or narrowing noted.  There was some mild tortuosity noted as expected given her age.  The right ureteral orifice was cannulated with a 6-French end-hole catheter, and right retrograde pyelogram was obtained.  Right retrograde pyelogram demonstrated single right ureter, single system right kidney.  No filling defects or narrowing noted.  There was also a tortuosity that appeared nonobstructive.  Next, the cystoscope was exchanged with a 26-French continuous flow  resectoscope sheath and using medium resectoscope loop, very resection was performed of the worrisome bladder lesions down what appeared to be the seromuscular fibers of the urinary bladder.  These tissue fragment was set aside for permanent pathology and labeled as bladder lesion.  Next, cold cup biopsy forceps were used to obtain a representative bladder muscle biopsy of the deep aspect of  this resection site.  This was set aside and labeled as base of bladder lesion, and 2 small random bladder biopsies were taken of the areas of erythema that were somewhat worrisome for carcinoma in situ.  These were also set aside and labeled as random bladder biopsies.  Additional coagulation current was applied to all biopsy sites in the area of resection.  Hemostasis appeared excellent.  There was no obvious bladder perforation.  Given the patient's age, it was felt that intervening catheterization would be warranted for at least 48 hours.  As such, a new 20-French 3-Foley catheter was placed per urethra to straight drain, 10 mL of sterile water in the balloon, efflux was perfectly clear.  The irrigation port was plugged.  Procedure was then terminated.  The patient tolerated the procedure well.  There were no immediate periprocedural complications. The patient was taken to postanesthesia care unit in stable condition.          ______________________________ [name], MD     TM/MEDQ  D:  06/11/2014  T:  06/11/2014  Job:  [id number]

## 2014-06-13 NOTE — Progress Notes (Signed)
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 >100PY smoker. On Xarelto at baseline for AFib (being held this hospitalization). Required transfusion last month. CT Hematuria protocol 05/2014 withtout worrisome renal masses, ? Posterior bladder mass v. Clot. UA/micro w/o sig pyuria or bacteruria. Bedside cysto 3/27 with multifocal small bladder lesions c/w likely carcinoma, clinically localized (one dome mass, few trigone lesions). 3/29 transurethral resection in OR with benign pathology, no evidence of malignancy. Discussed with patient this am.   2 - Non-Complex Left Renal Cyst - 2cm left renal cyst w/o enhancement or calcification incidental on hematuria CT 2016.   PMH sig for MI, AFib/Xarelto, chole, hyst, ortho surgeries,  Today [name] [name] is seen stable. Foley removed this am and voiding clear per her report.  Objective: Vital signs in last 24 hours: Temp:  [97.8 F (36.6 C)-98 F (36.7 C)] 97.8 F (36.6 C) (03/31 0539) Pulse Rate:  [65-67] 66 (03/31 0539) Resp:  [18] 18 (03/31 0539) BP: (121-134)/(42-54) 134/42 mmHg (03/31 0539) SpO2:  [94 %-97 %] 94 % (03/31 0741) Last BM Date: 06/10/14  Intake/Output from previous day: 03/30 0701 - 03/31 0700 In: 1200 [P.O.:1200] Out: 4900 [Urine:4900] Intake/Output this shift: Total I/O In: 240 [P.O.:240] Out: 125 [Urine:125]  General appearance: alert, cooperative and appears older than stated age Head: Normocephalic, without obvious abnormality, atraumatic Nose: Nares normal. Septum midline. Mucosa normal. No drainage or sinus tenderness. Throat: lips, mucosa, and tongue normal; teeth and gums normal Neck: supple, symmetrical, trachea midline Back: symmetric, no curvature. ROM normal. No CVA tenderness. Resp: non-labored on [place] O2 Cardio: Nl rate GI: soft, non-tender; bowel sounds normal; no masses,  no organomegaly Pelvic: external genitalia  normal Extremities: extremities normal, atraumatic, no cyanosis or edema Skin: Skin color, texture, turgor normal. No rashes or lesions Lymph nodes: Cervical, supraclavicular, and axillary nodes normal. Neurologic: Grossly normal  Lab Results:   Recent Labs  06/12/14 0455 06/13/14 0502  WBC 4.3 4.7  HGB 10.4* 10.9*  HCT 36.9 38.7  PLT 68* 78*   BMET  Recent Labs  06/12/14 0455 06/13/14 0502  NA 139 140  K 4.2 3.9  CL 98 98  CO2 36* 37*  GLUCOSE 94 95  BUN 9 16  CREATININE 0.68 0.58  CALCIUM 8.8 8.9   PT/INR No results for input(s): LABPROT, INR in the last 72 hours. ABG No results for input(s): PHART, HCO3 in the last 72 hours.  Invalid input(s): PCO2, PO2  Studies/Results: No results found.  Anti-infectives: Anti-infectives    Start     Dose/Rate Route Frequency Ordered Stop   06/11/14 0600  gentamicin (GARAMYCIN) 270 mg in dextrose 5 % 100 mL IVPB     5 mg/kg  54.4 kg 213.5 mL/hr over 30 Minutes Intravenous  Once 06/10/14 2346 06/11/14 0936   06/10/14 2333  gentamicin (GARAMYCIN) IVPB 80 mg  Status:  Discontinued     80 mg 100 mL/hr over 30 Minutes Intravenous 30 min pre-op 06/10/14 2333 06/10/14 2341   06/08/14 1800  cefTRIAXone (ROCEPHIN) 1 g in dextrose 5 % 50 mL IVPB - Premix     1 g 100 mL/hr over 30 Minutes Intravenous Every 24 hours 06/08/14 1713        Assessment/Plan:  1- Gross Hematuria with Anemia, bladder inflammation - no evidence of malignancy. Discussed results with patient this am. Foley out and voiding clear.    2 - Non-Complex Left Renal  Cyst - no indication for further evaluation or surveillance.   [place] for discharge from urology standpoint. Will see as outpatient on 4/22.   Wynetta Emery, Krystiana Fornes C 06/13/2014

## 2014-06-13 NOTE — Discharge Instructions (Signed)

## 2014-06-13 NOTE — Patient Outreach (Signed)
[facility]) Care Management  06/13/2014  ABRAHAM MARGULIES [birth year] [id number]  Noted patient admitted to [facility] on 3/26. Will await communication from [facility] and [place] regarding follow up  Joylene Draft, [place], [place] Care Management [phone].

## 2014-06-13 NOTE — Care Management Note (Signed)
CARE MANAGEMENT NOTE 06/13/2014  Patient:  [name] [name], [name] [name]   Account Number:  [id number]  Date Initiated:  06/13/2014  Documentation initiated by:  Marney Doctor  Subjective/Objective Assessment:   74 yo admitted with Cystitis. Hx of COPD     Action/Plan:   Pt from home alone and plans to go back home.   Anticipated DC Date:  06/13/2014   Anticipated DC Plan:  [place]  CM consult      [facility] Choice  HOME HEALTH   Choice offered to / List presented to:  C-1 Patient        [place] arranged  [place]      [place]   Status of service:  In process, will continue to follow Medicare Important Message given?   (If response is "NO", the following Medicare IM given date fields will be blank) Date Medicare IM given:   Medicare IM given by:   Date Additional Medicare IM given:   Additional Medicare IM given by:    Discharge Disposition:    Per UR Regulation:  Reviewed for med. necessity/level of care/duration of stay  If discussed at [place] of Stay Meetings, dates discussed:    Comments:  06/13/14 Marney Doctor RN,BSN,NCM [phone] PT is recommending HHPT. Pt states she has used [place] services in the past and would like to use them again. Pt states she would also like a HHRN to help her with her medications and take her blood pressure. Care [place] rep called to give referral. Pt has home 02 with a portable tank. No other CM needs noted.

## 2014-06-13 NOTE — Evaluation (Signed)
Physical Therapy Evaluation Patient Details Name: [name] [name] MRN: [id number] DOB: [birth year] Today's Date: 06/13/2014   History of Present Illness  Pt is 74 yo female with history of COPD/chronic resp failure on 2L Home O2, Afib, and history of falls with hip fracture. Pt presents with chronic hematuria suspected to be due to hemorrhagic cystitis.  Clinical Impression  Pt admitted witih above. Pt currently with functional limitations due to the deficits listed below (see PT Problem list). Pt tolerated session well today and was able to ambulate 140 ft with RW. SpO2 was 96% on 2L before session and 93% on 2L after ambulation. Pt will benefit from skilled PT to increase their independence and safety with mobility to allow discharge home.     Follow Up Recommendations Home health PT    Equipment Recommendations  None recommended by PT    Recommendations for Other Services       Precautions / Restrictions Precautions Precautions: Fall Restrictions Weight Bearing Restrictions: No      Mobility  Bed Mobility Overal bed mobility: Independent             General bed mobility comments: HOB was elevated  Transfers Overall transfer level: Needs assistance Equipment used: Rolling walker (2 wheeled) Transfers: Sit to/from [facility] Sit to Stand: Min guard Stand pivot transfers: Min guard       General transfer comment: pt required min guard due to unsteadiness however did not use RW due to urgency  Ambulation/Gait Ambulation/Gait assistance: Min guard Ambulation Distance (Feet): 140 Feet Assistive device: Rolling walker (2 wheeled) Gait Pattern/deviations: Step-through pattern;Decreased stride length     General Gait Details: min/guard for safety. Pt ambulated on 2L O2. SpO2 93% upon return to room  Stairs            Wheelchair Mobility    Modified Rankin (Stroke Patients Only)       Balance                                              Pertinent Vitals/Pain Pain Assessment: No/denies pain    Home Living Family/patient expects to be discharged to:: Private residence Living Arrangements: Alone Available Help at Discharge: Family (brother, daughter in law)   Home Access: Level entry     Home Layout: One level Frank: Environmental consultant - 2 wheels      Prior Function Level of Independence: Independent               Hand Dominance        Extremity/Trunk Assessment               Lower Extremity Assessment: Generalized weakness         Communication   Communication: HOH  Cognition Arousal/Alertness: Awake/alert Behavior During Therapy: WFL for tasks assessed/performed Overall Cognitive Status: Within Functional Limits for tasks assessed                      General Comments      Exercises        Assessment/Plan    PT Assessment Patient needs continued PT services  PT Diagnosis Generalized weakness;Difficulty walking   PT Problem List Decreased strength;Decreased activity tolerance;Decreased mobility  PT Treatment Interventions Gait training;Therapeutic exercise;Patient/family education;Functional mobility training;Therapeutic activities;Balance training   PT Goals (Current goals can be found in the Care  Plan section) Acute Rehab PT Goals PT Goal Formulation: With patient Time For Goal Achievement: 06/27/14 Potential to Achieve Goals: Good    Frequency Min 3X/week   Barriers to discharge        Co-evaluation               End of Session Equipment Utilized During Treatment: Gait belt;Oxygen Activity Tolerance: Patient tolerated treatment well Patient left: in chair;with call bell/phone within reach;with chair alarm set Nurse Communication: Mobility status         Time: 9485-4627 PT Time Calculation (min) (ACUTE ONLY): 16 min   Charges:   PT Evaluation $Initial PT Evaluation Tier I: 1 Procedure     PT G Codes:         Donzella Carrol 06/13/2014, 12:55 PM Charlott Holler, SPT

## 2014-06-14 IMAGING — CT CT ANGIO CHEST
2 of 6 series · 18 of 46 positions shown · IV contrast (APPLIED)
Comparison: Chest radiograph, 01/31/2012

CLINICAL DATA: The patient has chronic chest pain with occasional
heaviness in the middle of the chest.  The patient it is episodes
in the 2-3 months.  No other associated symptoms but the patient
does have chronic shortness of breath.

CT ANGIOGRAPHY CHEST
TECHNIQUE: Multidetector CT imaging of the chest using the
standard protocol during bolus administration of intravenous
contrast. Multiplanar reconstructed images including MIPs were
obtained and reviewed to evaluate the vascular anatomy.
Contrast: 80mL OMNIPAQUE IOHEXOL 350 MG/ML SOLN

[Series 7: pulm embolism 1.0 b25f thin · axial · 0.62mm/px · z∈[-270,-26]mm · 15 of 267 slices shown]
[im 12/267  lung]
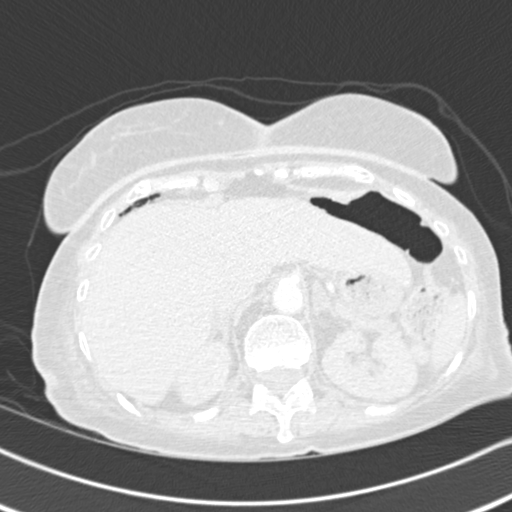
[im 35/267  soft-tissue]
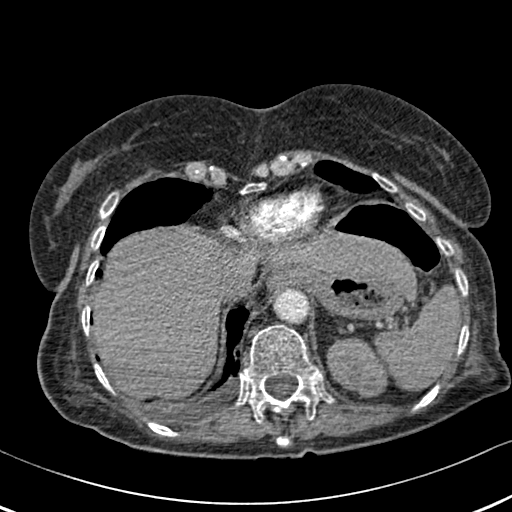
[im 47/267  lung]
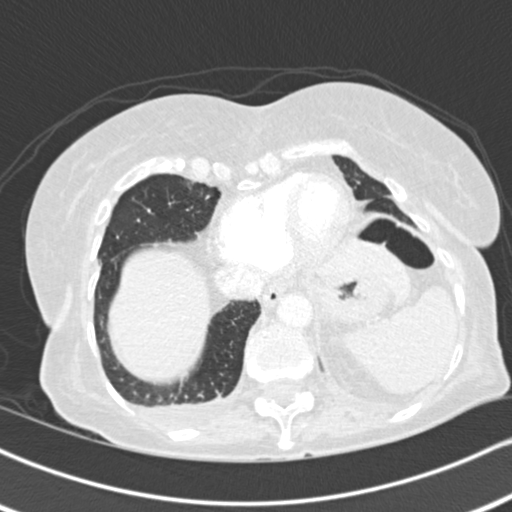
[im 70/267  soft-tissue]
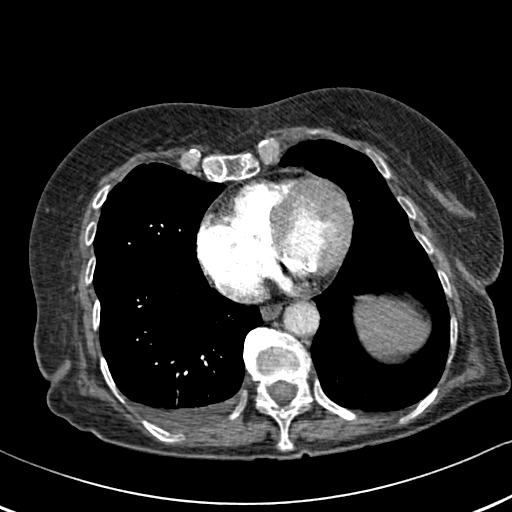
[im 81/267  lung]
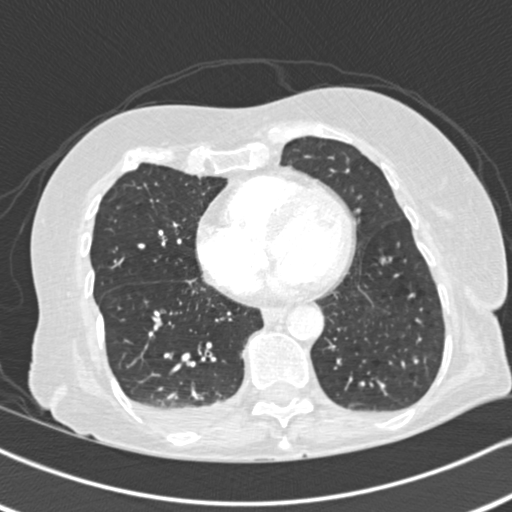
[im 105/267  soft-tissue]
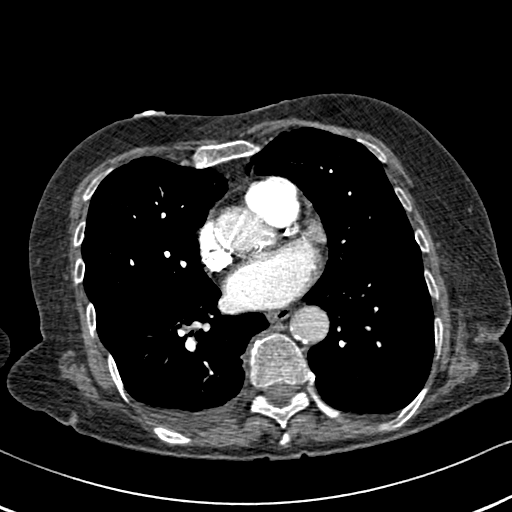
[im 116/267  lung]
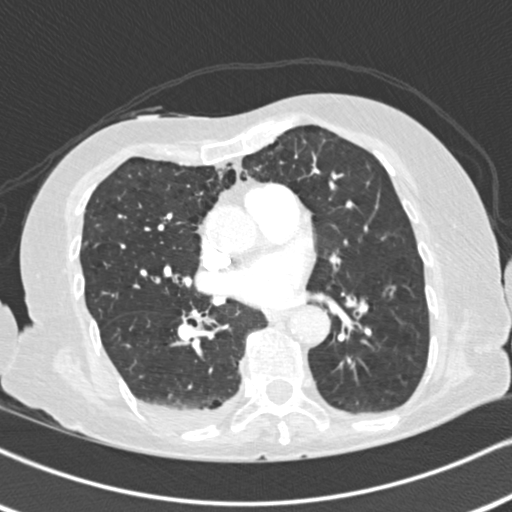
[im 139/267  soft-tissue]
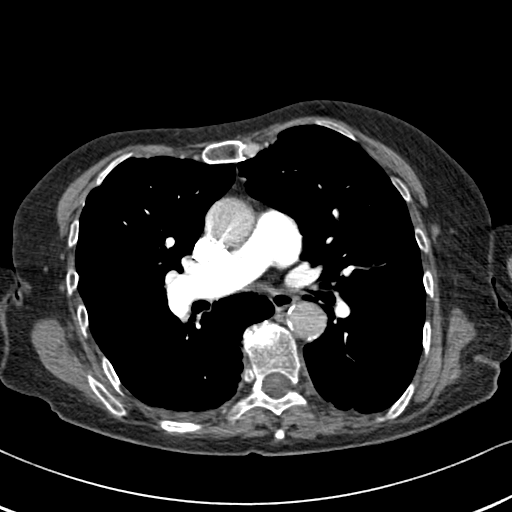
[im 151/267  lung]
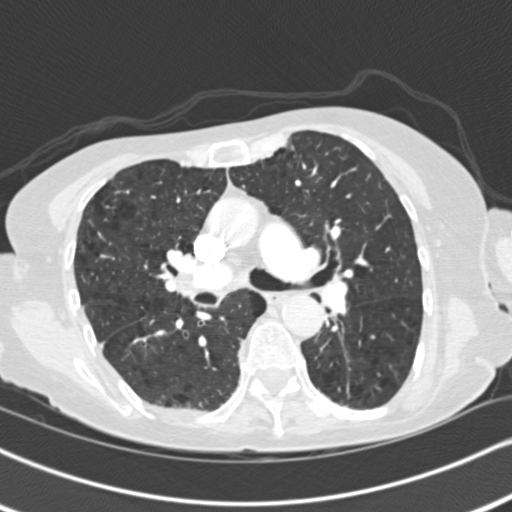
[im 162/267  soft-tissue]
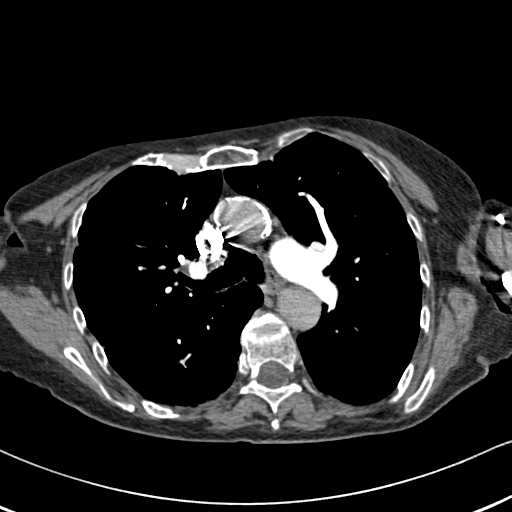
[im 186/267  lung]
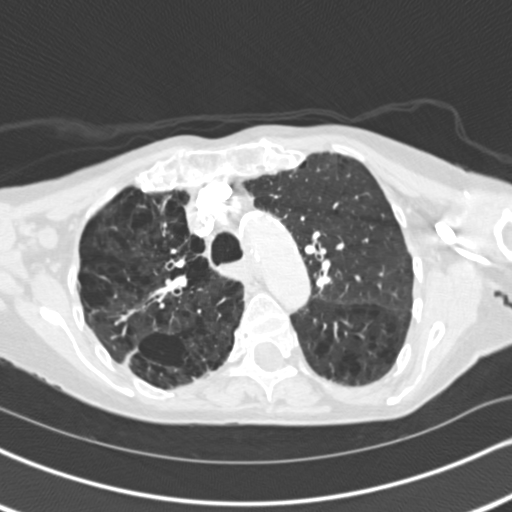
[im 197/267  soft-tissue]
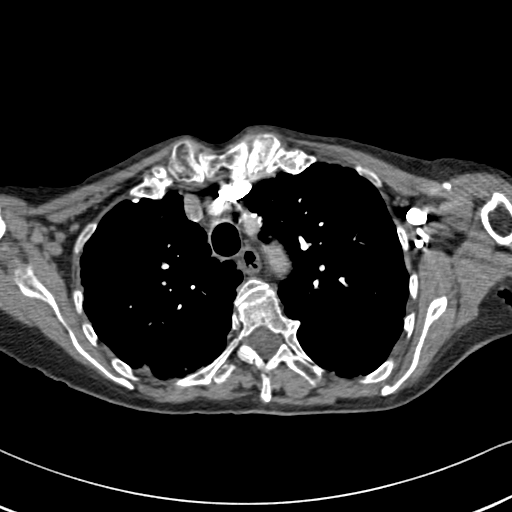
[im 220/267  lung]
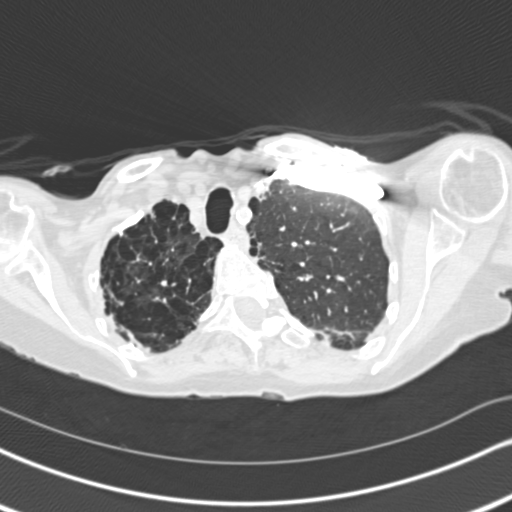
[im 232/267  soft-tissue]
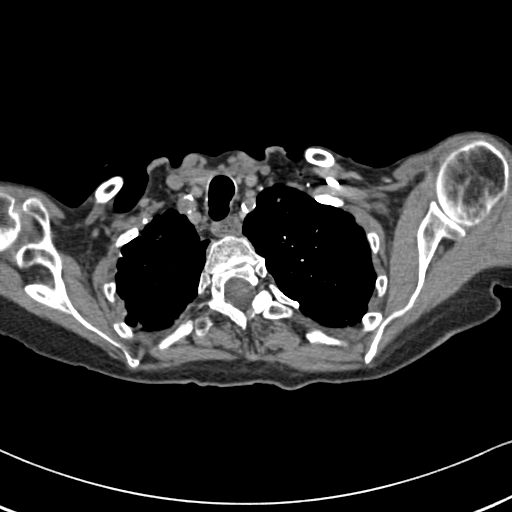
[im 255/267  lung]
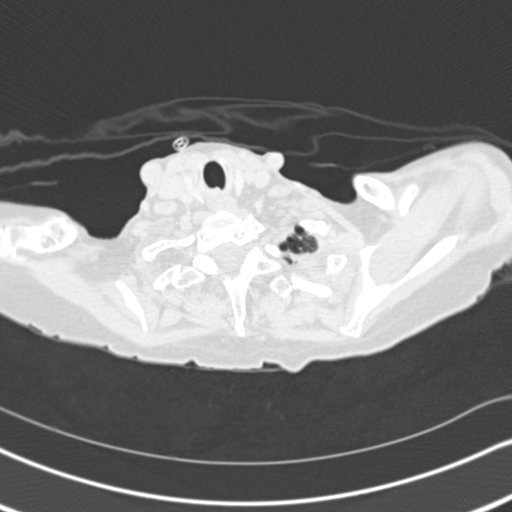

[Series 602: cor · coronal · 0.62mm/px · 3 of 101 slices shown]
[im 26/101  soft-tissue]
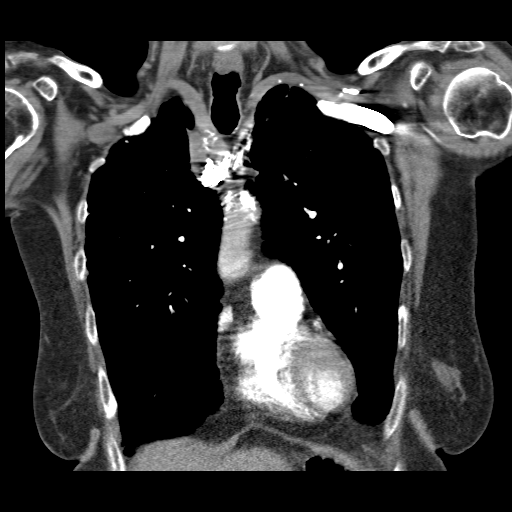
[im 51/101  soft-tissue]
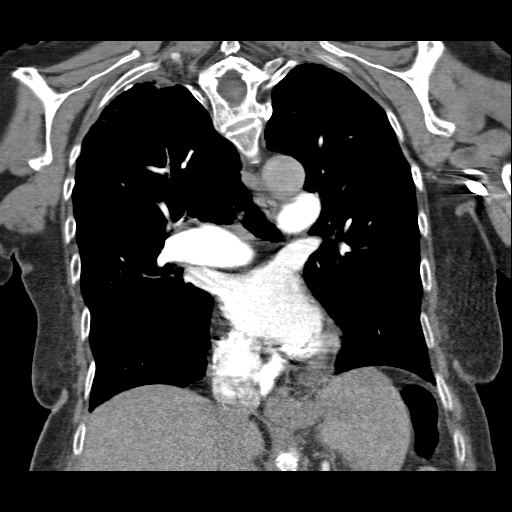
[im 76/101  soft-tissue]
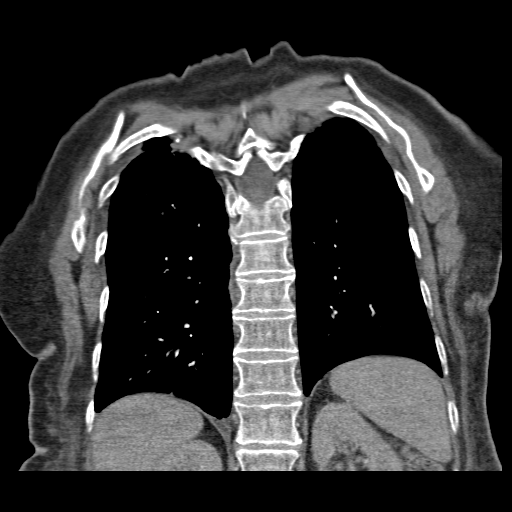

[18 of 46 positions shown; findings below may reference images not displayed]

FINDINGS: There is no evidence of a pulmonary embolism.

The heart is normal in size and configuration.  There are mild to
moderate coronary artery calcifications.  Atherosclerotic plaque is
noted along the thoracic aorta.  There is no aortic dilation or
dissection.  There is mild prominence of the right and left
pulmonary arteries both measuring 23 mm.  No mediastinal or hilar
masses or adenopathy.

There is a pulmonary anastomose the staple line at the posterior
lateral right apex.  Changes of emphysema are noted bilaterally,
fairly advanced and more prominent on the right.  There is chronic
appearing bilateral apical lung scarring with other areas of coarse
reticular peripheral lung scarring.  No pulmonary masses or nodules
are seen.  There are no areas of consolidation.  There are
reticular opacities that are dependent in the lower lobes
consistent with subsegmental atelectasis.  There are small right
and minimal left pleural effusions, but no evidence of pulmonary
edema.

The thoracic inlet is unremarkable.  Limited evaluation of the
upper abdomen shows mild adrenal gland thickening, likely
hyperplasia but is otherwise unremarkable.

There are mild degenerative changes of the thoracic spine.  No
osteoblastic or osteolytic lesions.
IMPRESSION: No evidence of a pulmonary embolism.

Small right and minimal left pleural effusions but no evidence of
pulmonary edema.

COPD with emphysema and areas of lung scarring.  Postsurgical
changes at the right apex likely from a bleb resection.

No infiltrate.

## 2014-06-15 ENCOUNTER — Emergency Department (HOSPITAL_COMMUNITY)
Admission: EM | Admit: 2014-06-15 | Discharge: 2014-06-15 | Disposition: A | Discharge status: Home / Self Care | Payer: Commercial Managed Care - HMO | Attending: Emergency Medicine | Admitting: Emergency Medicine

## 2014-06-15 ENCOUNTER — Encounter (HOSPITAL_COMMUNITY): Payer: Self-pay | Admitting: *Deleted

## 2014-06-15 DIAGNOSIS — Z7951 Long term (current) use of inhaled steroids: Secondary | ICD-10-CM | POA: Diagnosis not present

## 2014-06-15 DIAGNOSIS — J449 Chronic obstructive pulmonary disease, unspecified: Secondary | ICD-10-CM | POA: Diagnosis not present

## 2014-06-15 DIAGNOSIS — Z7901 Long term (current) use of anticoagulants: Secondary | ICD-10-CM | POA: Diagnosis not present

## 2014-06-15 DIAGNOSIS — K2970 Gastritis, unspecified, without bleeding: Secondary | ICD-10-CM | POA: Diagnosis not present

## 2014-06-15 DIAGNOSIS — Z79899 Other long term (current) drug therapy: Secondary | ICD-10-CM | POA: Insufficient documentation

## 2014-06-15 DIAGNOSIS — Z86718 Personal history of other venous thrombosis and embolism: Secondary | ICD-10-CM | POA: Diagnosis not present

## 2014-06-15 DIAGNOSIS — R319 Hematuria, unspecified: Secondary | ICD-10-CM | POA: Diagnosis not present

## 2014-06-15 DIAGNOSIS — K219 Gastro-esophageal reflux disease without esophagitis: Secondary | ICD-10-CM | POA: Diagnosis not present

## 2014-06-15 DIAGNOSIS — Z8701 Personal history of pneumonia (recurrent): Secondary | ICD-10-CM | POA: Insufficient documentation

## 2014-06-15 DIAGNOSIS — I129 Hypertensive chronic kidney disease with stage 1 through stage 4 chronic kidney disease, or unspecified chronic kidney disease: Secondary | ICD-10-CM | POA: Diagnosis not present

## 2014-06-15 DIAGNOSIS — N189 Chronic kidney disease, unspecified: Secondary | ICD-10-CM | POA: Insufficient documentation

## 2014-06-15 DIAGNOSIS — I252 Old myocardial infarction: Secondary | ICD-10-CM | POA: Insufficient documentation

## 2014-06-15 DIAGNOSIS — D68318 Other hemorrhagic disorder due to intrinsic circulating anticoagulants, antibodies, or inhibitors: Secondary | ICD-10-CM | POA: Diagnosis not present

## 2014-06-15 DIAGNOSIS — Z87891 Personal history of nicotine dependence: Secondary | ICD-10-CM | POA: Insufficient documentation

## 2014-06-15 DIAGNOSIS — R197 Diarrhea, unspecified: Secondary | ICD-10-CM | POA: Diagnosis not present

## 2014-06-15 DIAGNOSIS — Z8739 Personal history of other diseases of the musculoskeletal system and connective tissue: Secondary | ICD-10-CM | POA: Diagnosis not present

## 2014-06-15 DIAGNOSIS — R310 Gross hematuria: Secondary | ICD-10-CM | POA: Insufficient documentation

## 2014-06-15 DIAGNOSIS — Z87440 Personal history of urinary (tract) infections: Secondary | ICD-10-CM | POA: Diagnosis not present

## 2014-06-15 LAB — CBC WITH DIFFERENTIAL/PLATELET
Basophils Absolute: 0 10*3/uL (ref 0.0–0.1)
Basophils Relative: 0 % (ref 0–1)
Eosinophils Absolute: 0 10*3/uL (ref 0.0–0.7)
Eosinophils Relative: 1 % (ref 0–5)
HCT: 40.5 % (ref 36.0–46.0)
Hemoglobin: 11.6 g/dL — ABNORMAL LOW (ref 12.0–15.0)
Lymphocytes Relative: 18 % (ref 12–46)
Lymphs Abs: 0.8 10*3/uL (ref 0.7–4.0)
MCH: 23 pg — ABNORMAL LOW (ref 26.0–34.0)
MCHC: 28.6 g/dL — ABNORMAL LOW (ref 30.0–36.0)
MCV: 80.4 fL (ref 78.0–100.0)
Monocytes Absolute: 0.6 10*3/uL (ref 0.1–1.0)
Monocytes Relative: 13 % — ABNORMAL HIGH (ref 3–12)
Neutro Abs: 3.1 10*3/uL (ref 1.7–7.7)
Neutrophils Relative %: 68 % (ref 43–77)
Platelets: 110 10*3/uL — ABNORMAL LOW (ref 150–400)
RBC: 5.04 MIL/uL (ref 3.87–5.11)
RDW: 25.8 % — ABNORMAL HIGH (ref 11.5–15.5)
WBC: 4.5 10*3/uL (ref 4.0–10.5)

## 2014-06-15 LAB — URINALYSIS, ROUTINE W REFLEX MICROSCOPIC
Bilirubin Urine: NEGATIVE
Glucose, UA: NEGATIVE mg/dL
Ketones, ur: NEGATIVE mg/dL
Leukocytes, UA: NEGATIVE
Nitrite: NEGATIVE
Protein, ur: 30 mg/dL — AB
Specific Gravity, Urine: 1.006 (ref 1.005–1.030)
Urobilinogen, UA: 0.2 mg/dL (ref 0.0–1.0)
pH: 8 (ref 5.0–8.0)

## 2014-06-15 LAB — URINE MICROSCOPIC-ADD ON

## 2014-06-15 NOTE — ED Notes (Addendum)
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 Pt had surgery and was discharged. Pt reports she continues to have blood in urine and dysuria, which is now getting worse. Generalized abdominal pain 7/10.  Reports she has been here once since surgery, but she does not remember medical advice given and no record of this visit in chart.   Pt wears continuous oxygen 2 L [place].

## 2014-06-15 NOTE — ED Notes (Signed)
md at bedside

## 2014-06-15 NOTE — ED Notes (Signed)
[facility] called due to pt wears oxygen continuous and family did not bring oxygen tank to ED.

## 2014-06-15 NOTE — ED Notes (Addendum)
Prior to any irrigation foley placed and 1018ml output.  1st bag of NS 3083ml irrigated into foley, estimated 500 ml leaked out of bag and onto towel on bed. Pt output 1580ml.  Now 2nd bag of NS 3025ml irrigating into foley currently.  At 1553 urine output 3000 ml. Foley removed, pt to be discharged.

## 2014-06-15 NOTE — ED Notes (Signed)
[facility] arrived.  

## 2014-06-15 NOTE — ED Notes (Signed)
Bed: WA20 Expected date: 06/15/14 Expected time: 12:38 PM Means of arrival: Ambulance Comments: Hematuria

## 2014-06-15 NOTE — Discharge Instructions (Signed)

## 2014-06-16 ENCOUNTER — Encounter (HOSPITAL_COMMUNITY): Payer: Self-pay | Admitting: *Deleted

## 2014-06-16 ENCOUNTER — Emergency Department (HOSPITAL_COMMUNITY)
Admission: EM | Admit: 2014-06-16 | Discharge: 2014-06-16 | Disposition: A | Discharge status: Home / Self Care | Payer: Commercial Managed Care - HMO | Attending: Emergency Medicine | Admitting: Emergency Medicine

## 2014-06-16 DIAGNOSIS — R3915 Urgency of urination: Secondary | ICD-10-CM | POA: Diagnosis not present

## 2014-06-16 DIAGNOSIS — R339 Retention of urine, unspecified: Secondary | ICD-10-CM | POA: Insufficient documentation

## 2014-06-16 DIAGNOSIS — Z86718 Personal history of other venous thrombosis and embolism: Secondary | ICD-10-CM | POA: Diagnosis not present

## 2014-06-16 DIAGNOSIS — Z8701 Personal history of pneumonia (recurrent): Secondary | ICD-10-CM | POA: Diagnosis not present

## 2014-06-16 DIAGNOSIS — Z7951 Long term (current) use of inhaled steroids: Secondary | ICD-10-CM | POA: Diagnosis not present

## 2014-06-16 DIAGNOSIS — Z9889 Other specified postprocedural states: Secondary | ICD-10-CM | POA: Diagnosis not present

## 2014-06-16 DIAGNOSIS — M1990 Unspecified osteoarthritis, unspecified site: Secondary | ICD-10-CM | POA: Diagnosis not present

## 2014-06-16 DIAGNOSIS — Z87891 Personal history of nicotine dependence: Secondary | ICD-10-CM | POA: Diagnosis not present

## 2014-06-16 DIAGNOSIS — Z79899 Other long term (current) drug therapy: Secondary | ICD-10-CM | POA: Insufficient documentation

## 2014-06-16 DIAGNOSIS — N189 Chronic kidney disease, unspecified: Secondary | ICD-10-CM | POA: Insufficient documentation

## 2014-06-16 DIAGNOSIS — R9431 Abnormal electrocardiogram [ECG] [EKG]: Secondary | ICD-10-CM | POA: Diagnosis not present

## 2014-06-16 DIAGNOSIS — K2970 Gastritis, unspecified, without bleeding: Secondary | ICD-10-CM | POA: Diagnosis not present

## 2014-06-16 DIAGNOSIS — R350 Frequency of micturition: Secondary | ICD-10-CM | POA: Insufficient documentation

## 2014-06-16 DIAGNOSIS — Z862 Personal history of diseases of the blood and blood-forming organs and certain disorders involving the immune mechanism: Secondary | ICD-10-CM | POA: Insufficient documentation

## 2014-06-16 DIAGNOSIS — I252 Old myocardial infarction: Secondary | ICD-10-CM | POA: Insufficient documentation

## 2014-06-16 DIAGNOSIS — I129 Hypertensive chronic kidney disease with stage 1 through stage 4 chronic kidney disease, or unspecified chronic kidney disease: Secondary | ICD-10-CM | POA: Diagnosis not present

## 2014-06-16 DIAGNOSIS — Z7901 Long term (current) use of anticoagulants: Secondary | ICD-10-CM | POA: Insufficient documentation

## 2014-06-16 DIAGNOSIS — K219 Gastro-esophageal reflux disease without esophagitis: Secondary | ICD-10-CM | POA: Insufficient documentation

## 2014-06-16 DIAGNOSIS — R319 Hematuria, unspecified: Secondary | ICD-10-CM | POA: Diagnosis present

## 2014-06-16 DIAGNOSIS — J449 Chronic obstructive pulmonary disease, unspecified: Secondary | ICD-10-CM | POA: Diagnosis not present

## 2014-06-16 DIAGNOSIS — R002 Palpitations: Secondary | ICD-10-CM | POA: Diagnosis not present

## 2014-06-16 LAB — BASIC METABOLIC PANEL
ANION GAP: 9 (ref 5–15)
BUN: 18 mg/dL (ref 6–23)
CALCIUM: 8.6 mg/dL (ref 8.4–10.5)
CHLORIDE: 93 mmol/L — AB (ref 96–112)
CO2: 41 mmol/L (ref 19–32)
Creatinine, Ser: 0.93 mg/dL (ref 0.50–1.10)
GFR, EST AFRICAN AMERICAN: 69 mL/min — AB (ref >90)
GFR, EST NON AFRICAN AMERICAN: 60 mL/min — AB (ref >90)
Glucose, Bld: 104 mg/dL — ABNORMAL HIGH (ref 70–99)
POTASSIUM: 4.4 mmol/L (ref 3.5–5.1)
SODIUM: 143 mmol/L (ref 135–145)

## 2014-06-16 LAB — CBC WITH DIFFERENTIAL/PLATELET
Basophils Absolute: 0 10*3/uL (ref 0.0–0.1)
Basophils Relative: 0 % (ref 0–1)
EOS ABS: 0.1 10*3/uL (ref 0.0–0.7)
Eosinophils Relative: 3 % (ref 0–5)
HCT: 39.3 % (ref 36.0–46.0)
Hemoglobin: 11.2 g/dL — ABNORMAL LOW (ref 12.0–15.0)
LYMPHS ABS: 1 10*3/uL (ref 0.7–4.0)
Lymphocytes Relative: 30 % (ref 12–46)
MCH: 23.1 pg — AB (ref 26.0–34.0)
MCHC: 28.5 g/dL — AB (ref 30.0–36.0)
MCV: 81.2 fL (ref 78.0–100.0)
MONOS PCT: 13 % — AB (ref 3–12)
Monocytes Absolute: 0.4 10*3/uL (ref 0.1–1.0)
Neutro Abs: 1.8 10*3/uL (ref 1.7–7.7)
Neutrophils Relative %: 54 % (ref 43–77)
Platelets: 87 10*3/uL — ABNORMAL LOW (ref 150–400)
RBC: 4.84 MIL/uL (ref 3.87–5.11)
RDW: 25 % — AB (ref 11.5–15.5)
WBC: 3.3 10*3/uL — ABNORMAL LOW (ref 4.0–10.5)

## 2014-06-16 LAB — PROTIME-INR
INR: 1.03 (ref 0.00–1.49)
PROTHROMBIN TIME: 13.6 s (ref 11.6–15.2)

## 2014-06-16 NOTE — ED Notes (Signed)
Bed: ML46 Expected date: 06/16/14 Expected time: 1:55 PM Means of arrival:  Comments: Hematuria

## 2014-06-16 NOTE — Discharge Instructions (Signed)
Not taking Xarelto (blood thinner) Monday, Tuesday, or Wednesday. Home health care will contact you tomorrow for a home visit to help you with your catheter care. Call Dr. [name] at [facility] for earlier follow-up appointment.  Acute Urinary Retention Urinary retention means you are unable to pee completely or at all (empty your bladder). HOME CARE  Drink enough fluids to keep your pee (urine) clear or pale yellow.  If you are sent home with a tube that drains the bladder (catheter), there will be a drainage bag attached to it. There are two types of bags. One is big that you can wear at night without having to empty it. One is smaller and needs to be emptied more often.  Keep the drainage bag emptied.  Keep the drainage bag lower than the tube.  Only take medicine as told by your doctor. GET HELP IF:  You have a low-grade fever.  You have spasms or you are leaking pee when you have spasms. GET HELP RIGHT AWAY IF:   You have chills or a fever.  Your catheter stops draining pee.  Your catheter falls out.  You have increased bleeding that does not stop after you have rested and increased the amount of fluids you had been drinking. MAKE SURE YOU:   Understand these instructions.  Will watch your condition.  Will get help right away if you are not doing well or get worse. Document Released: 08/18/2007 Document Revised: 03/06/2013 Document Reviewed: 08/10/2012 [facility] Patient Information 2015 [place], [place]. This information is not intended to replace advice given to you by your health care provider. Make sure you discuss any questions you have with your health care provider.

## 2014-06-16 NOTE — ED Notes (Signed)
[facility] called  

## 2014-06-16 NOTE — ED Notes (Addendum)
Per ems pt reported continued hematuria. Pt seen and treated in ED yesterday for hematuria. Pt had bladder surgery on Tuesday-discharged on thursday, results pending on pathology. Pt bladder irrigated with 6000 ml yesterday. Pt reports continued abdominal pain and blood in urine. Abdominal pain 7/10.   Pt reports she started having more blood in urine since 0200.

## 2014-06-16 NOTE — ED Notes (Signed)
rn walked pt to the bathroom, first showed pt how to empty foley bag and then had pt do multiple return demonstrations.

## 2014-06-16 NOTE — ED Provider Notes (Addendum)
CSN: [id number]     Arrival date & time 06/16/14  1410 History   First MD Initiated Contact with Patient 06/16/14 1506     Chief Complaint  Patient presents with  . Hematuria     HPI  She presents for evaluation of "blood in my urine". 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 [name] on 329. Has had hematuria since that time. However, left the hospital with clear urine, and without Foley. Seen and evaluated yesterday. Apparently had a large bladder part arrival. Underwent bladder irrigation of multiple clots. Urine cleared. Discharge home. Presents today stating that she is intermittently still urinating. Not seeing clots, however "quite a bit of blood".    Past Medical History  Diagnosis Date  . Hypertension   . Myocardial infarction   . Shortness of breath   . Asthma   . COPD (chronic obstructive pulmonary disease)     emphysema    . Pneumonia     hx pneumonia ,chronic bronchitis   . H/O blood clots   . Chronic kidney disease     current   UTI   being treated  . GERD (gastroesophageal reflux disease)   . Headache(784.0)     hx migraines  . Blood dyscrasia     hx blood clotts  . Arthritis    Past Surgical History  Procedure Laterality Date  . Right leg    . Hip fracture surgery      RIGHT SIDE  . Shoulder surgery      RIGHT  . Cholecystectomy    . Eye surgery      BIL CATARACT   . Orif wrist fracture  10/13/2011    Procedure: OPEN REDUCTION INTERNAL FIXATION (ORIF) WRIST FRACTURE;  Surgeon: [name], MD;  Location: [place];  Service: Orthopedics;  Laterality: Right;  Open Reduction Internal Fixation Right Distal Radius   . Abdominal hysterectomy    . Btl    . Hardware removal  01/29/2012    Procedure: HARDWARE REMOVAL;  Surgeon: [name], MD;  Location: [place];  Service: Orthopedics;  Laterality: Right;  . Hip arthroplasty  01/29/2012    Procedure: ARTHROPLASTY BIPOLAR HIP;  Surgeon: [name], MD;  Location:  [place];  Service: Orthopedics;  Laterality: Right;  . Cardiac catheterization    . Transurethral resection of bladder tumor N/A 06/11/2014    Procedure: TRANSURETHRAL RESECTION OF BLADDER TUMOR (TURBT);  Surgeon: [name], MD;  Location: [facility];  Service: Urology;  Laterality: N/A;  . Cystoscopy w/ retrogrades Bilateral 06/11/2014    Procedure: CYSTOSCOPY WITH RETROGRADE PYELOGRAM;  Surgeon: [name], MD;  Location: [facility];  Service: Urology;  Laterality: Bilateral;   Family History  Problem Relation Age of Onset  . Cancer - Lung Mother   . Coronary artery disease Mother   . Coronary artery disease Sister   . Coronary artery disease Brother    History  Substance Use Topics  . Smoking status: Former Smoker -- 1.00 packs/day for 50 years    Types: Cigarettes    Quit date: 06/27/2013  . Smokeless tobacco: Not on file  . Alcohol Use: No   OB History    No data available     Review of Systems  Constitutional: Negative for fever, chills, diaphoresis, appetite change and fatigue.  HENT: Negative for mouth sores, sore throat and trouble swallowing.   Eyes: Negative for visual disturbance.  Respiratory: Negative for cough, chest tightness,  shortness of breath and wheezing.   Cardiovascular: Negative for chest pain.  Gastrointestinal: Negative for nausea, vomiting, abdominal pain, diarrhea and abdominal distention.  Endocrine: Negative for polydipsia, polyphagia and polyuria.  Genitourinary: Positive for urgency, frequency and hematuria. Negative for dysuria.  Musculoskeletal: Negative for gait problem.  Skin: Negative for color change, pallor and rash.  Neurological: Negative for dizziness, syncope, light-headedness and headaches.  Hematological: Does not bruise/bleed easily.  Psychiatric/Behavioral: Negative for behavioral problems and confusion.      Allergies  Review of patient's allergies indicates no known allergies.  Home Medications   Prior to Admission  medications   Medication Sig Start Date End Date Taking? Authorizing Provider  albuterol (PROVENTIL) (2.5 MG/3ML) 0.083% nebulizer solution Take 2.5 mg by nebulization every 6 (six) hours.   Yes Historical Provider, MD  atorvastatin (LIPITOR) 40 MG tablet Take 40 mg by mouth every morning.  07/02/13  Yes Historical Provider, MD  budesonide-formoterol (SYMBICORT) 160-4.5 MCG/ACT inhaler Inhale 2 puffs into the lungs 2 (two) times daily. 08/06/13  Yes Barton Dubois, MD  digoxin (LANOXIN) 0.25 MG tablet Take 0.125 mg by mouth every morning.    Yes Historical Provider, MD  diltiazem (CARDIZEM CD) 240 MG 24 hr capsule Take 240 mg by mouth every morning.  04/08/14  Yes Historical Provider, MD  docusate sodium (COLACE) 100 MG capsule Take 100 mg by mouth daily as needed (constipation).    Yes Historical Provider, MD  Fe Bisgly-Vit C-Vit B12-FA 28-60-0.008-0.4 MG CAPS Take 1 capsule by mouth every morning.   Yes Historical Provider, MD  fluticasone (FLONASE) 50 MCG/ACT nasal spray Place 1 spray into both nostrils daily as needed for allergies or rhinitis. 08/06/13  Yes Barton Dubois, MD  furosemide (LASIX) 20 MG tablet Take 1 tablet (20 mg total) by mouth daily. 06/13/14  Yes Theodis Blaze, MD  iron polysaccharides (NIFEREX) 150 MG capsule Take 1 capsule (150 mg total) by mouth daily. 04/29/14  Yes Annita Brod, MD  loperamide (IMODIUM) 2 MG capsule Take 2 mg by mouth 2 (two) times daily as needed for diarrhea or loose stools.    Yes Historical Provider, MD  metoprolol succinate (TOPROL-XL) 50 MG 24 hr tablet Take 12.5 mg by mouth daily. Take with or immediately following a meal.   Yes Historical Provider, MD  Omega-3 Fatty Acids (FISH OIL) 1200 MG CAPS Take 1,200 mg by mouth every morning.    Yes Historical Provider, MD  OVER THE COUNTER MEDICATION Take 1 tablet by mouth at bedtime. Hyland's Leg Cramps PM for sleep and leg cramps   Yes Historical Provider, MD  oxybutynin (DITROPAN-XL) 5 MG 24 hr tablet Take  10 mg by mouth at bedtime.   Yes Historical Provider, MD  pantoprazole (PROTONIX) 40 MG tablet Take 40 mg by mouth every morning.  06/12/13  Yes Historical Provider, MD  rivaroxaban (XARELTO) 20 MG TABS tablet Take 20 mg by mouth daily with supper.   Yes Historical Provider, MD  tiotropium (SPIRIVA) 18 MCG inhalation capsule Place 1 capsule (18 mcg total) into inhaler and inhale daily. Patient taking differently: Place 18 mcg into inhaler and inhale every morning.  08/06/13  Yes Barton Dubois, MD  traMADol (ULTRAM) 50 MG tablet Take 50-100 mg by mouth 2 (two) times daily as needed (pain).    Yes Historical Provider, MD  VENTOLIN HFA 108 (90 BASE) MCG/ACT inhaler Inhale 2 puffs into the lungs 4 (four) times daily as needed for wheezing or shortness of breath.  06/07/14  Yes Historical Provider, MD  vitamin B-12 (CYANOCOBALAMIN) 500 MCG tablet Take 1,000 mcg by mouth daily.    Yes Historical Provider, MD   BP 137/53 mmHg  Pulse 63  Temp(Src) 97.7 F (36.5 C) (Oral)  Resp 16  SpO2 98% Physical Exam  Constitutional: She is oriented to person, place, and time. She appears well-developed and well-nourished. No distress.  HENT:  Head: Normocephalic.  Eyes: Conjunctivae are normal. Pupils are equal, round, and reactive to light. No scleral icterus.  Neck: Normal range of motion. Neck supple. No thyromegaly present.  Cardiovascular: Normal rate and regular rhythm.  Exam reveals no gallop and no friction rub.   No murmur heard. Pulmonary/Chest: Effort normal and breath sounds normal. No respiratory distress. She has no wheezes. She has no rales.  Abdominal: Soft. Bowel sounds are normal. She exhibits no distension. There is no tenderness. There is no rebound.  Lower abdomen soft. Bedside ultrasound shows distention of the urinary bladder.  ultrasound does not show hydronephrosis.  Musculoskeletal: Normal range of motion.  Neurological: She is alert and oriented to person, place, and time.  Skin:  Skin is warm and dry. No rash noted.  Psychiatric: She has a normal mood and affect. Her behavior is normal.    ED Course  Procedures (including critical care time) Labs Review Labs Reviewed  CBC WITH DIFFERENTIAL/PLATELET - Abnormal; Notable for the following:    WBC 3.3 (*)    Hemoglobin 11.2 (*)    MCH 23.1 (*)    MCHC 28.5 (*)    RDW 25.0 (*)    Platelets 87 (*)    Monocytes Relative 13 (*)    All other components within normal limits  BASIC METABOLIC PANEL - Abnormal; Notable for the following:    Chloride 93 (*)    CO2 41 (*)    Glucose, Bld 104 (*)    GFR calc non Af Amer 60 (*)    GFR calc Af Amer 69 (*)    All other components within normal limits  URINE CULTURE  PROTIME-INR    Imaging Review No results found.   EKG Interpretation   Date/Time:  Sunday June 16 2014 16:47:24 EDT Ventricular Rate:  61 PR Interval:  169 QRS Duration: 92 QT Interval:  432 QTC Calculation: 435 R Axis:   67 Text Interpretation:  Sinus rhythm Abnormal R-wave progression, early  transition Borderline repolarization abnormality Confirmed by Jeneen Rinks  MD,  [place] ([id number]) on 06/16/2014 5:32:41 PM      MDM   Final diagnoses:  Urinary retention   I discussed the case with Dr. Wynetta Emery for [facility]. No clots or frank gross hematuria. Stable H&H. Patient with 600 mL of postvoid residual. Foley catheter placed. Dr. Wynetta Emery was in agreement with indwelling catheter until follow-up with the patient's urologist Dr. [name]. I contacted the care manager. They in turn has spoken with patient, and patient's son, and have arranged home health care to visit the patient at home tomorrow to ensure that she is adequately caring for catheter. Instructed in it use here. She was able to demonstrate here to our nursing staff ability to be her bag.  Mild elevation of CO2. Normal sodium potassium. Baseline creatinine. Stable hemoglobin.      Tanna Furry, MD 06/16/14 1720  Tanna Furry, MD 06/16/14  [phone]

## 2014-06-16 NOTE — ED Provider Notes (Signed)
CSN: [id number]     Arrival date & time 06/15/14  1245 History   First MD Initiated Contact with Patient 06/15/14 1302     Chief Complaint  Patient presents with  . Hematuria  . Abdominal Pain     (Consider location/radiation/quality/duration/timing/severity/associated sxs/prior Treatment) HPI   74yF with hematuria. Pt recently admitted. Had cystoscopy with biopsy of bladder lesions. Discharged w/o foley. Today with gross hematuria. Denies dysuria or abdominal pain. No dizziness, lightheadedness. On xarelto. Has urology FU later this month.   Past Medical History  Diagnosis Date  . Hypertension   . Myocardial infarction   . Shortness of breath   . Asthma   . COPD (chronic obstructive pulmonary disease)     emphysema    . Pneumonia     hx pneumonia ,chronic bronchitis   . H/O blood clots   . Chronic kidney disease     current   UTI   being treated  . GERD (gastroesophageal reflux disease)   . Headache(784.0)     hx migraines  . Blood dyscrasia     hx blood clotts  . Arthritis    Past Surgical History  Procedure Laterality Date  . Right leg    . Hip fracture surgery      RIGHT SIDE  . Shoulder surgery      RIGHT  . Cholecystectomy    . Eye surgery      BIL CATARACT   . Orif wrist fracture  10/13/2011    Procedure: OPEN REDUCTION INTERNAL FIXATION (ORIF) WRIST FRACTURE;  Surgeon: [name], MD;  Location: [place];  Service: Orthopedics;  Laterality: Right;  Open Reduction Internal Fixation Right Distal Radius   . Abdominal hysterectomy    . Btl    . Hardware removal  01/29/2012    Procedure: HARDWARE REMOVAL;  Surgeon: [name], MD;  Location: [place];  Service: Orthopedics;  Laterality: Right;  . Hip arthroplasty  01/29/2012    Procedure: ARTHROPLASTY BIPOLAR HIP;  Surgeon: [name], MD;  Location: [facility];  Service: Orthopedics;  Laterality: Right;  . Cardiac catheterization    . Transurethral resection of bladder tumor N/A 06/11/2014    Procedure:  TRANSURETHRAL RESECTION OF BLADDER TUMOR (TURBT);  Surgeon: [name], MD;  Location: [facility];  Service: Urology;  Laterality: N/A;  . Cystoscopy w/ retrogrades Bilateral 06/11/2014    Procedure: CYSTOSCOPY WITH RETROGRADE PYELOGRAM;  Surgeon: [name], MD;  Location: [facility];  Service: Urology;  Laterality: Bilateral;   Family History  Problem Relation Age of Onset  . Cancer - Lung Mother   . Coronary artery disease Mother   . Coronary artery disease Sister   . Coronary artery disease Brother    History  Substance Use Topics  . Smoking status: Former Smoker -- 1.00 packs/day for 50 years    Types: Cigarettes    Quit date: 06/27/2013  . Smokeless tobacco: Not on file  . Alcohol Use: No   OB History    No data available     Review of Systems  All systems reviewed and negative, other than as noted in HPI.   Allergies  Review of patient's allergies indicates no known allergies.  Home Medications   Prior to Admission medications   Medication Sig Start Date End Date Taking? Authorizing Provider  albuterol (PROVENTIL) (2.5 MG/3ML) 0.083% nebulizer solution Take 2.5 mg by nebulization every 6 (six) hours.   Yes Historical Provider, MD  atorvastatin (LIPITOR) 40 MG  tablet Take 40 mg by mouth every morning.  07/02/13  Yes Historical Provider, MD  budesonide-formoterol (SYMBICORT) 160-4.5 MCG/ACT inhaler Inhale 2 puffs into the lungs 2 (two) times daily. 08/06/13  Yes Barton Dubois, MD  digoxin (LANOXIN) 0.25 MG tablet Take 0.125 mg by mouth every morning.    Yes Historical Provider, MD  diltiazem (CARDIZEM CD) 240 MG 24 hr capsule Take 240 mg by mouth every morning.  04/08/14  Yes Historical Provider, MD  docusate sodium (COLACE) 100 MG capsule Take 100 mg by mouth daily as needed (constipation).    Yes Historical Provider, MD  Fe Bisgly-Vit C-Vit B12-FA 28-60-0.008-0.4 MG CAPS Take 1 capsule by mouth every morning.   Yes Historical Provider, MD  fluticasone (FLONASE) 50 MCG/ACT  nasal spray Place 1 spray into both nostrils daily as needed for allergies or rhinitis. 08/06/13  Yes Barton Dubois, MD  furosemide (LASIX) 20 MG tablet Take 1 tablet (20 mg total) by mouth daily. 06/13/14  Yes Theodis Blaze, MD  loperamide (IMODIUM) 2 MG capsule Take 2 mg by mouth 2 (two) times daily as needed for diarrhea or loose stools.    Yes Historical Provider, MD  metoprolol succinate (TOPROL-XL) 50 MG 24 hr tablet Take 12.5 mg by mouth daily. Take with or immediately following a meal.   Yes Historical Provider, MD  Omega-3 Fatty Acids (FISH OIL) 1200 MG CAPS Take 1,200 mg by mouth every morning.    Yes Historical Provider, MD  OVER THE COUNTER MEDICATION Take 1 tablet by mouth at bedtime. Hyland's Leg Cramps PM for sleep and leg cramps   Yes Historical Provider, MD  oxybutynin (DITROPAN-XL) 5 MG 24 hr tablet Take 10 mg by mouth at bedtime.   Yes Historical Provider, MD  pantoprazole (PROTONIX) 40 MG tablet Take 40 mg by mouth every morning.  06/12/13  Yes Historical Provider, MD  rivaroxaban (XARELTO) 20 MG TABS tablet Take 20 mg by mouth daily with supper.   Yes Historical Provider, MD  tiotropium (SPIRIVA) 18 MCG inhalation capsule Place 1 capsule (18 mcg total) into inhaler and inhale daily. Patient taking differently: Place 18 mcg into inhaler and inhale daily as needed (for shortness of breath).  08/06/13  Yes Barton Dubois, MD  traMADol (ULTRAM) 50 MG tablet Take 50-100 mg by mouth 2 (two) times daily as needed (pain).    Yes Historical Provider, MD  VENTOLIN HFA 108 (90 BASE) MCG/ACT inhaler Inhale 2 puffs into the lungs 4 (four) times daily as needed for wheezing or shortness of breath.  06/07/14  Yes Historical Provider, MD  vitamin B-12 (CYANOCOBALAMIN) 500 MCG tablet Take 1,000 mcg by mouth daily.    Yes Historical Provider, MD  iron polysaccharides (NIFEREX) 150 MG capsule Take 1 capsule (150 mg total) by mouth daily. Patient not taking: Reported on 06/15/2014 04/29/14   Annita Brod, MD   BP 130/45 mmHg  Pulse 71  Temp(Src) 97.7 F (36.5 C) (Oral)  Resp 17  SpO2 100% Physical Exam  Constitutional: She appears well-developed and well-nourished. No distress.  HENT:  Head: Normocephalic and atraumatic.  Eyes: Conjunctivae are normal. Right eye exhibits no discharge. Left eye exhibits no discharge.  Neck: Neck supple.  Cardiovascular: Normal rate, regular rhythm and normal heart sounds.  Exam reveals no gallop and no friction rub.   No murmur heard. Pulmonary/Chest: Effort normal and breath sounds normal. No respiratory distress.  Abdominal: Soft. She exhibits no distension. There is no tenderness.  Prior to foley, no  abdominal tenderness. Bladder did not feel distended.   Genitourinary:  Foley initially with gross hematuria with a couple small clots noted.   Musculoskeletal: She exhibits no edema or tenderness.  Neurological: She is alert.  Skin: Skin is warm and dry.  Psychiatric: She has a normal mood and affect. Her behavior is normal. Thought content normal.  Nursing note and vitals reviewed.   ED Course  Procedures (including critical care time) Labs Review Labs Reviewed  URINALYSIS, ROUTINE W REFLEX MICROSCOPIC - Abnormal; Notable for the following:    Color, Urine RED (*)    APPearance TURBID (*)    Hgb urine dipstick LARGE (*)    Protein, ur 30 (*)    All other components within normal limits  CBC WITH DIFFERENTIAL/PLATELET - Abnormal; Notable for the following:    Hemoglobin 11.6 (*)    MCH 23.0 (*)    MCHC 28.6 (*)    RDW 25.8 (*)    Platelets 110 (*)    Monocytes Relative 13 (*)    All other components within normal limits  BASIC METABOLIC PANEL  URINE MICROSCOPIC-ADD ON    Imaging Review No results found.   EKG Interpretation None      MDM   Final diagnoses:  Gross hematuria  On rivaroxaban therapy    74yF with hematuria. Recent cystoscopy and biopsy of bladder lesions. On xarelto.   3-way foley placed and  irrigated.  Effluent now color of pink lemonade. H/H higher than when left hospital. Is on xarelto for hx of paroxsymal afib. CHADSVASC score is 4. Discussed risks with her and family (4.8% per year in >90,000 patients (the [place] Atrial Fibrillation Cohort Study) and 6.7% risk of stroke/TIA/systemic embolism). Shared decision making and will hold xarelto with ongoing hematuria until can follow-up with her cardiologist, Dr Einar Gip, hopefully next week. Has scheduled appoint with Dr [name], urology on 4/22. Recommended calling office and trying to move appointment up. At this point I do not feel further intervention is needed and that she is safe for discharge.   Virgel Manifold, MD 06/16/14 1425

## 2014-06-16 NOTE — Progress Notes (Addendum)
Referral received for [facility] RN to manage foley catheter at home. NCM attempted call to speak with son, Ardine Bjork [phone] to arrange [facility] RN. Left message and waiting call back to offer choice for [facility]. Will arrange with [facility] once choice is received from son. Spoke to attending he will complete orders for [facility] RN with face to face. Jonnie Finner RN CCM Case Mgmt phone [phone]

## 2014-06-16 NOTE — ED Notes (Signed)
[facility] transporting pt back to home.   Pt encouraged by nurse to consider moving closer to her son.

## 2014-06-16 NOTE — ED Notes (Signed)
Pt gave rn pts son phone number. rn called and gave son update.  [name] [name], pt son [phone]

## 2014-06-16 NOTE — Progress Notes (Signed)
NCM spoke to son, Ardine Bjork and he is requesting [name] for [facility]. Will notify [place] for new referral.  Jonnie Finner RN CCM Case Mgmt phone [phone]

## 2014-06-16 NOTE — ED Notes (Signed)
rn called son and relayed discharge instructions. Son verbalized understanding. rn also went over discharge instructions with pt.

## 2014-06-16 NOTE — Progress Notes (Addendum)
CARE MANAGEMENT NOTE 06/16/2014  Patient:  [name] [name], [name] [name]   Account Number:  [id number]  Date Initiated:  06/16/2014  Documentation initiated by:  [facility]  Subjective/Objective Assessment:   hematuria     Action/Plan:   Anticipated DC Date:  06/16/2014   Anticipated DC Plan:  [place]  CM consult      [facility] Choice  HOME HEALTH   Choice offered to / List presented to:  C-4 Adult Children   DME arranged  [place]      DME agency  [facility]     [place] arranged  HH-1 RN  [facility]      [place] agency  [facility]   Status of service:  Completed, signed off Medicare Important Message given?   (If response is "NO", the following Medicare IM given date fields will be blank) Date Medicare IM given:   Medicare IM given by:   Date Additional Medicare IM given:   Additional Medicare IM given by:    Discharge Disposition:  [place]  Per UR Regulation:    If discussed at Long Length of Stay Meetings, dates discussed:    Comments:   06/16/2014 1815 faxed order to [place] for DME. [name] out of network. Jonnie Finner RN CCM Case Mgmt phone [phone]   06/16/2014 1800 NCM faxed orders to [facility] for [facility] RN and faxed request for DME to [facility]. Will follow up with [name] and [place] on 4-4. Jonnie Finner RN CCM Case Mgmt phone [phone]  06/16/2014  5:06 PM  NCM spoke to son, Ardine Bjork and he is requesting [name] for [facility]. Will notify [place] for new referral.  Jonnie Finner RN CCM Case Mgmt phone [phone]    06/16/2014  5:00 PM  Referral received for [facility] RN to manage foley catheter at home. NCM attempted call to speak with son, Ardine Bjork [phone] to arrange [facility] RN. Left message and waiting call back to offer choice for [facility]. Will arrange with [facility] once choice is received from son. Spoke to attending he will complete orders for [facility] RN with face to face. Jonnie Finner  RN CCM Case Mgmt phone [phone]

## 2014-06-17 LAB — URINE CULTURE
Colony Count: NO GROWTH
Culture: NO GROWTH
Special Requests: NORMAL

## 2014-06-18 ENCOUNTER — Encounter (HOSPITAL_COMMUNITY): Payer: Self-pay

## 2014-06-18 ENCOUNTER — Emergency Department (HOSPITAL_COMMUNITY)
Admission: EM | Admit: 2014-06-18 | Discharge: 2014-06-18 | Disposition: A | Discharge status: Home / Self Care | Payer: Commercial Managed Care - HMO | Attending: Emergency Medicine | Admitting: Emergency Medicine

## 2014-06-18 DIAGNOSIS — N189 Chronic kidney disease, unspecified: Secondary | ICD-10-CM | POA: Diagnosis not present

## 2014-06-18 DIAGNOSIS — Z9889 Other specified postprocedural states: Secondary | ICD-10-CM | POA: Diagnosis not present

## 2014-06-18 DIAGNOSIS — I129 Hypertensive chronic kidney disease with stage 1 through stage 4 chronic kidney disease, or unspecified chronic kidney disease: Secondary | ICD-10-CM | POA: Diagnosis not present

## 2014-06-18 DIAGNOSIS — Z8701 Personal history of pneumonia (recurrent): Secondary | ICD-10-CM | POA: Diagnosis not present

## 2014-06-18 DIAGNOSIS — J449 Chronic obstructive pulmonary disease, unspecified: Secondary | ICD-10-CM | POA: Insufficient documentation

## 2014-06-18 DIAGNOSIS — K219 Gastro-esophageal reflux disease without esophagitis: Secondary | ICD-10-CM | POA: Insufficient documentation

## 2014-06-18 DIAGNOSIS — T8351XA Infection and inflammatory reaction due to indwelling urinary catheter, initial encounter: Secondary | ICD-10-CM | POA: Diagnosis not present

## 2014-06-18 DIAGNOSIS — T83098A Other mechanical complication of other indwelling urethral catheter, initial encounter: Secondary | ICD-10-CM | POA: Diagnosis not present

## 2014-06-18 DIAGNOSIS — I252 Old myocardial infarction: Secondary | ICD-10-CM | POA: Diagnosis not present

## 2014-06-18 DIAGNOSIS — T8189XA Other complications of procedures, not elsewhere classified, initial encounter: Secondary | ICD-10-CM | POA: Diagnosis not present

## 2014-06-18 DIAGNOSIS — R319 Hematuria, unspecified: Secondary | ICD-10-CM | POA: Diagnosis not present

## 2014-06-18 DIAGNOSIS — Z7951 Long term (current) use of inhaled steroids: Secondary | ICD-10-CM | POA: Insufficient documentation

## 2014-06-18 DIAGNOSIS — Y846 Urinary catheterization as the cause of abnormal reaction of the patient, or of later complication, without mention of misadventure at the time of the procedure: Secondary | ICD-10-CM | POA: Diagnosis not present

## 2014-06-18 DIAGNOSIS — R1013 Epigastric pain: Secondary | ICD-10-CM | POA: Diagnosis not present

## 2014-06-18 DIAGNOSIS — D649 Anemia, unspecified: Secondary | ICD-10-CM | POA: Diagnosis not present

## 2014-06-18 DIAGNOSIS — Z7901 Long term (current) use of anticoagulants: Secondary | ICD-10-CM

## 2014-06-18 DIAGNOSIS — Z87891 Personal history of nicotine dependence: Secondary | ICD-10-CM | POA: Insufficient documentation

## 2014-06-18 DIAGNOSIS — I10 Essential (primary) hypertension: Secondary | ICD-10-CM | POA: Diagnosis not present

## 2014-06-18 DIAGNOSIS — M1990 Unspecified osteoarthritis, unspecified site: Secondary | ICD-10-CM | POA: Diagnosis not present

## 2014-06-18 DIAGNOSIS — T839XXA Unspecified complication of genitourinary prosthetic device, implant and graft, initial encounter: Secondary | ICD-10-CM

## 2014-06-18 DIAGNOSIS — Z79899 Other long term (current) drug therapy: Secondary | ICD-10-CM | POA: Diagnosis not present

## 2014-06-18 MED ORDER — GI COCKTAIL ~~LOC~~
30.0000 mL | Freq: Once | ORAL | Status: AC
  Administered 2014-06-18: 30 mL via ORAL
  Filled 2014-06-18: qty 30

## 2014-06-18 MED ORDER — PANTOPRAZOLE SODIUM 40 MG PO TBEC: 40.0000 mg | DELAYED_RELEASE_TABLET | Freq: Every day | ORAL | Status: AC

## 2014-06-18 MED ORDER — PANTOPRAZOLE SODIUM 40 MG PO TBEC
40.0000 mg | DELAYED_RELEASE_TABLET | Freq: Once | ORAL | Status: AC
  Administered 2014-06-18: 40 mg via ORAL
  Filled 2014-06-18: qty 1

## 2014-06-18 NOTE — ED Notes (Signed)
Per EMS: Pt had bladder surgery on 3/29 and had a urinary catheter placed during. Tonight, awoken to leakage around catheter and had soiled herself. Is complaining of some decreased urinary output and pain at catheter site.

## 2014-06-18 NOTE — ED Provider Notes (Signed)
CSN: [id number]     Arrival date & time 06/18/14  0247 History   First MD Initiated Contact with Patient 06/18/14 0542     Chief Complaint  Patient presents with  . Post-op Problem     (Consider location/radiation/quality/duration/timing/severity/associated sxs/prior Treatment) The history is provided by the patient.   74 year old female comes in with a malfunctioning Foley catheter. She had catheter placed following transurethral resection of bladder tumors and has been in the ED 2 other times for occluded catheters. Most recent ED visit was 2 days ago. She started having problems with decreased drainage yesterday and is also complaining of pain across her lower abdomen. There's been no fever, nausea, vomiting.  Past Medical History  Diagnosis Date  . Hypertension   . Myocardial infarction   . Shortness of breath   . Asthma   . COPD (chronic obstructive pulmonary disease)     emphysema    . Pneumonia     hx pneumonia ,chronic bronchitis   . H/O blood clots   . Chronic kidney disease     current   UTI   being treated  . GERD (gastroesophageal reflux disease)   . Headache(784.0)     hx migraines  . Blood dyscrasia     hx blood clotts  . Arthritis    Past Surgical History  Procedure Laterality Date  . Right leg    . Hip fracture surgery      RIGHT SIDE  . Shoulder surgery      RIGHT  . Cholecystectomy    . Eye surgery      BIL CATARACT   . Orif wrist fracture  10/13/2011    Procedure: OPEN REDUCTION INTERNAL FIXATION (ORIF) WRIST FRACTURE;  Surgeon: [name], MD;  Location: [place];  Service: Orthopedics;  Laterality: Right;  Open Reduction Internal Fixation Right Distal Radius   . Abdominal hysterectomy    . Btl    . Hardware removal  01/29/2012    Procedure: HARDWARE REMOVAL;  Surgeon: [name], MD;  Location: [place];  Service: Orthopedics;  Laterality: Right;  . Hip arthroplasty  01/29/2012    Procedure: ARTHROPLASTY BIPOLAR HIP;  Surgeon: [name], MD;   Location: [place];  Service: Orthopedics;  Laterality: Right;  . Cardiac catheterization    . Transurethral resection of bladder tumor N/A 06/11/2014    Procedure: TRANSURETHRAL RESECTION OF BLADDER TUMOR (TURBT);  Surgeon: [name], MD;  [facility];  Service: Urology;  Laterality: N/A;  . Cystoscopy w/ retrogrades Bilateral 06/11/2014    Procedure: CYSTOSCOPY WITH RETROGRADE PYELOGRAM;  Surgeon: [name], MD;  [facility];  Service: Urology;  Laterality: Bilateral;   Family History  Problem Relation Age of Onset  . Cancer - Lung Mother   . Coronary artery disease Mother   . Coronary artery disease Sister   . Coronary artery disease Brother    History  Substance Use Topics  . Smoking status: Former Smoker -- 1.00 packs/day for 50 years    Types: Cigarettes    Quit date: 06/27/2013  . Smokeless tobacco: Not on file  . Alcohol Use: No   OB History    No data available     Review of Systems  All other systems reviewed and are negative.     Allergies  Review of patient's allergies indicates no known allergies.  Home Medications   Prior to Admission medications   Medication Sig Start Date End Date Taking? Authorizing Provider  albuterol (PROVENTIL) (2.5 MG/3ML) 0.083% nebulizer solution Take 2.5 mg by nebulization every 6 (six) hours.    Historical Provider, MD  atorvastatin (LIPITOR) 40 MG tablet Take 40 mg by mouth every morning.  07/02/13   Historical Provider, MD  budesonide-formoterol (SYMBICORT) 160-4.5 MCG/ACT inhaler Inhale 2 puffs into the lungs 2 (two) times daily. 08/06/13   Barton Dubois, MD  digoxin (LANOXIN) 0.25 MG tablet Take 0.125 mg by mouth every morning.     Historical Provider, MD  diltiazem (CARDIZEM CD) 240 MG 24 hr capsule Take 240 mg by mouth every morning.  04/08/14   Historical Provider, MD  docusate sodium (COLACE) 100 MG capsule Take 100 mg by mouth daily as needed (constipation).     Historical Provider, MD  Fe Bisgly-Vit C-Vit  B12-FA 28-60-0.008-0.4 MG CAPS Take 1 capsule by mouth every morning.    Historical Provider, MD  fluticasone (FLONASE) 50 MCG/ACT nasal spray Place 1 spray into both nostrils daily as needed for allergies or rhinitis. 08/06/13   Barton Dubois, MD  furosemide (LASIX) 20 MG tablet Take 1 tablet (20 mg total) by mouth daily. 06/13/14   Theodis Blaze, MD  iron polysaccharides (NIFEREX) 150 MG capsule Take 1 capsule (150 mg total) by mouth daily. 04/29/14   Annita Brod, MD  loperamide (IMODIUM) 2 MG capsule Take 2 mg by mouth 2 (two) times daily as needed for diarrhea or loose stools.     Historical Provider, MD  metoprolol succinate (TOPROL-XL) 50 MG 24 hr tablet Take 12.5 mg by mouth daily. Take with or immediately following a meal.    Historical Provider, MD  Omega-3 Fatty Acids (FISH OIL) 1200 MG CAPS Take 1,200 mg by mouth every morning.     Historical Provider, MD  OVER THE COUNTER MEDICATION Take 1 tablet by mouth at bedtime. Hyland's Leg Cramps PM for sleep and leg cramps    Historical Provider, MD  oxybutynin (DITROPAN-XL) 5 MG 24 hr tablet Take 10 mg by mouth at bedtime.    Historical Provider, MD  pantoprazole (PROTONIX) 40 MG tablet Take 40 mg by mouth every morning.  06/12/13   Historical Provider, MD  rivaroxaban (XARELTO) 20 MG TABS tablet Take 20 mg by mouth daily with supper.    Historical Provider, MD  tiotropium (SPIRIVA) 18 MCG inhalation capsule Place 1 capsule (18 mcg total) into inhaler and inhale daily. Patient taking differently: Place 18 mcg into inhaler and inhale every morning.  08/06/13   Barton Dubois, MD  traMADol (ULTRAM) 50 MG tablet Take 50-100 mg by mouth 2 (two) times daily as needed (pain).     Historical Provider, MD  VENTOLIN HFA 108 (90 BASE) MCG/ACT inhaler Inhale 2 puffs into the lungs 4 (four) times daily as needed for wheezing or shortness of breath.  06/07/14   Historical Provider, MD  vitamin B-12 (CYANOCOBALAMIN) 500 MCG tablet Take 1,000 mcg by mouth daily.      Historical Provider, MD   BP 153/72 mmHg  Pulse 71  Temp(Src) 98.2 F (36.8 C) (Oral)  Resp 15  SpO2 100% Physical Exam  Nursing note and vitals reviewed.  74 year old female, resting comfortably and in no acute distress. Vital signs are significant for hypertension. Oxygen saturation is 100%, which is normal. Head is normocephalic and atraumatic. PERRLA, EOMI. Oropharynx is clear. Neck is nontender and supple without adenopathy or JVD. Back is nontender and there is no CVA tenderness. Lungs are clear without rales, wheezes, or rhonchi. Chest is  nontender. Heart has regular rate and rhythm without murmur. Abdomen is soft, flat, nontender without masses or hepatosplenomegaly and peristalsis is normoactive. Distended bladder is noted in the suprapubic area. Genitalia: 30 French Foley catheter in place with small amount of blood present in the drainage tube. Extremities have no cyanosis or edema, full range of motion is present. Skin is warm and dry without rash. Neurologic: Mental status is normal, cranial nerves are intact, there are no motor or sensory deficits.  ED Course  Procedures (including critical care time)  MDM   Final diagnoses:  Complication of Foley catheter, initial encounter  Hematuria  Anticoagulant long-term use  Epigastric pain    Occluded Foley catheter. Old records are reviewed confirming recent transurethral resection of bladder tumors and ED visits for occluded catheters. Of note, she is on rivaroxaban. Since she is on an anticoagulant, she is going to be prone to ongoing bleeding. She will need to have a larger catheter to deal with likelihood of ongoing hematuria.  She had good urine flow once the catheter was replaced. She is not complaining of some epigastric discomfort as well as mid back discomfort. She is mildly tender in the epigastric area. She's given a dose of the GI cocktail and is sent home with prescription for pantoprazole. She is to  follow-up with her urologist in her PCP later this week.  Delora Fuel, MD 67/67/20 9470

## 2014-06-18 NOTE — Discharge Instructions (Signed)
See your urologist this week.  Acute Urinary Retention Acute urinary retention is the temporary inability to urinate. This is an uncommon problem in women. It can be caused by:  Infection.  A side effect of a medicine.  A problem in a nearby organ that presses or squeezes on the bladder or the urethra (the tube that drains the bladder).  Psychological problems.   Surgery on your bladder, urethra, or pelvic organs that causes obstruction to the outflow of urine from your bladder. HOME CARE INSTRUCTIONS  If you are sent home with a Foley catheter and a drainage system, you will need to discuss the best course of action with your health care provider. While the catheter is in, maintain a good intake of fluids. Keep the drainage bag emptied and lower than your catheter. This is so that contaminated urine will not flow back into your bladder, which could lead to a urinary tract infection. There are two main types of drainage bags. One is a large bag that usually is used at night. It has a good capacity that will allow you to sleep through the night without having to empty it. The second type is called a leg bag. It has a smaller capacity so it needs to be emptied more frequently. However, the main advantage is that it can be attached by a leg strap and goes underneath your clothing, allowing you the freedom to move about or leave your home. Only take over-the-counter or prescription medicines for pain, discomfort, or fever as directed by your health care provider.  SEEK MEDICAL CARE IF: 1. You develop a low-grade fever. 2. You experience spasms or leakage of urine with the spasms. SEEK IMMEDIATE MEDICAL CARE IF:  1. You develop chills or fever. 2. Your catheter stops draining urine. 3. Your catheter falls out. 4. You start to develop increased bleeding that does not respond to rest and increased fluid intake. MAKE SURE YOU: 1. Understand these instructions. 2. Will watch your  condition. 3. Will get help right away if you are not doing well or get worse. Document Released: 02/28/2006 Document Revised: 12/20/2012 Document Reviewed: 08/10/2012 [facility] Patient Information 2015 [place], [place]. This information is not intended to replace advice given to you by your health care provider. Make sure you discuss any questions you have with your health care provider.  Foley Catheter Care A Foley catheter is a soft, flexible tube that is placed into the bladder to drain urine. A Foley catheter may be inserted if:  You leak urine or are not able to control when you urinate (urinary incontinence).  You are not able to urinate when you need to (urinary retention).  You had prostate surgery or surgery on the genitals.  You have certain medical conditions, such as multiple sclerosis, dementia, or a spinal cord injury. If you are going home with a Foley catheter in place, follow the instructions below. TAKING CARE OF THE CATHETER 3. Wash your hands with soap and water. 4. Using mild soap and warm water on a clean washcloth:  Clean the area on your body closest to the catheter insertion site using a circular motion, moving away from the catheter. Never wipe toward the catheter because this could sweep bacteria up into the urethra and cause infection.  Remove all traces of soap. Pat the area dry with a clean towel. For males, reposition the foreskin. 5. Attach the catheter to your leg so there is no tension on the catheter. Use adhesive tape or a  leg strap. If you are using adhesive tape, remove any sticky residue left behind by the previous tape you used. 6. Keep the drainage bag below the level of the bladder, but keep it off the floor. 7. Check throughout the day to be sure the catheter is working and urine is draining freely. Make sure the tubing does not become kinked. 8. Do not pull on the catheter or try to remove it. Pulling could damage internal tissues. TAKING CARE OF  THE DRAINAGE BAGS You will be given two drainage bags to take home. One is a large overnight drainage bag, and the other is a smaller leg bag that fits underneath clothing. You may wear the overnight bag at any time, but you should never wear the smaller leg bag at night. Follow the instructions below for how to empty, change, and clean your drainage bags. Emptying the Drainage Bag You must empty your drainage bag when it is  - full or at least 2-3 times a day. 5. Wash your hands with soap and water. 6. Keep the drainage bag below your hips, below the level of your bladder. This stops urine from going back into the tubing and into your bladder. 7. Hold the dirty bag over the toilet or a clean container. 8. Open the pour spout at the bottom of the bag and empty the urine into the toilet or container. Do not let the pour spout touch the toilet, container, or any other surface. Doing so can place bacteria on the bag, which can cause an infection. 9. Clean the pour spout with a gauze pad or cotton ball that has rubbing alcohol on it. 10. Close the pour spout. 11. Attach the bag to your leg with adhesive tape or a leg strap. 12. Wash your hands well. Changing the Drainage Bag Change your drainage bag once a month or sooner if it starts to smell bad or look dirty. Below are steps to follow when changing the drainage bag. 4. Wash your hands with soap and water. 5. Pinch off the rubber catheter so that urine does not spill out. 6. Disconnect the catheter tube from the drainage tube at the connection valve. Do not let the tubes touch any surface. 7. Clean the end of the catheter tube with an alcohol wipe. Use a different alcohol wipe to clean the end of the drainage tube. 8. Connect the catheter tube to the drainage tube of the clean drainage bag. 9. Attach the new bag to the leg with adhesive tape or a leg strap. Avoid attaching the new bag too tightly. 10. Wash your hands well. Cleaning the Drainage  Bag 1. Wash your hands with soap and water. 2. Wash the bag in warm, soapy water. 3. Rinse the bag thoroughly with warm water. 4. Fill the bag with a solution of white vinegar and water (1 cup vinegar to 1 qt warm water [.2 L vinegar to 1 L warm water]). Close the bag and soak it for 30 minutes in the solution. 5. Rinse the bag with warm water. 6. Hang the bag to dry with the pour spout open and hanging downward. 7. Store the clean bag (once it is dry) in a clean plastic bag. 8. Wash your hands well. PREVENTING INFECTION  Wash your hands before and after handling your catheter.  Take showers daily and wash the area where the catheter enters your body. Do not take baths. Replace wet leg straps with dry ones, if this applies.  Do not  use powders, sprays, or lotions on the genital area. Only use creams, lotions, or ointments as directed by your caregiver.  For females, wipe from front to back after each bowel movement.  Drink enough fluids to keep your urine clear or pale yellow unless you have a fluid restriction.  Do not let the drainage bag or tubing touch or lie on the floor.  Wear cotton underwear to absorb moisture and to keep your skin drier. SEEK MEDICAL CARE IF:   Your urine is cloudy or smells unusually bad.  Your catheter becomes clogged.  You are not draining urine into the bag or your bladder feels full.  Your catheter starts to leak. SEEK IMMEDIATE MEDICAL CARE IF:   You have pain, swelling, redness, or pus where the catheter enters the body.  You have pain in the abdomen, legs, lower back, or bladder.  You have a fever.  You see blood fill the catheter, or your urine is pink or red.  You have nausea, vomiting, or chills.  Your catheter gets pulled out. MAKE SURE YOU:   Understand these instructions.  Will watch your condition.  Will get help right away if you are not doing well or get worse. Document Released: 03/01/2005 Document Revised: 07/16/2013  Document Reviewed: 02/21/2012 [facility] Patient Information 2015 [place], [place]. This information is not intended to replace advice given to you by your health care provider. Make sure you discuss any questions you have with your health care provider.

## 2014-06-18 NOTE — ED Notes (Signed)
Pt bed was wet, clothing and bedding changed, urine clear in color, foley leaking.

## 2014-06-18 NOTE — ED Notes (Signed)
Family at bedside. 

## 2014-06-18 NOTE — ED Notes (Signed)
Called [facility] 

## 2014-06-20 ENCOUNTER — Other Ambulatory Visit: Payer: Self-pay | Admitting: *Deleted

## 2014-06-20 ENCOUNTER — Encounter: Payer: Self-pay | Admitting: *Deleted

## 2014-06-20 NOTE — Patient Outreach (Signed)
[facility]) Care Management   06/20/2014  [name] [name] May 24, 1940 [id number]  [name] [name] is an 74 y.o. female  Subjective: Patient greeted RN at the door, states I've been through a lot since you saw me, but its going to get better, looking forward to getting catheter out soon. She discussed her recent surgery and ED visits.  Objective:   Review of Systems  Respiratory:       Decreased breath sound bilateral lower lobes  BP 130/80 mmHg  Pulse 64  Resp 20  SpO2 97%  Physical Exam  Constitutional: Vital signs are normal. She is cooperative.  Cardiovascular: Normal rate and regular rhythm.   Respiratory: Effort normal. She has decreased breath sounds.  Neurological: She is alert.  Skin: Skin is warm and dry.  Foley catheter with clear yellow urine  Current Medications:   Current Outpatient Prescriptions  Medication Sig Dispense Refill  . albuterol (PROVENTIL) (2.5 MG/3ML) 0.083% nebulizer solution Take 2.5 mg by nebulization every 6 (six) hours.    Marland Kitchen atorvastatin (LIPITOR) 40 MG tablet Take 40 mg by mouth every morning.     . budesonide-formoterol (SYMBICORT) 160-4.5 MCG/ACT inhaler Inhale 2 puffs into the lungs 2 (two) times daily. 1 Inhaler 12  . digoxin (LANOXIN) 0.25 MG tablet Take 0.125 mg by mouth every morning.     . diltiazem (CARDIZEM CD) 240 MG 24 hr capsule Take 240 mg by mouth every morning.   1  . docusate sodium (COLACE) 100 MG capsule Take 100 mg by mouth daily as needed (constipation).     . Fe Bisgly-Vit C-Vit B12-FA 28-60-0.008-0.4 MG CAPS Take 1 capsule by mouth every morning.    . fluticasone (FLONASE) 50 MCG/ACT nasal spray Place 1 spray into both nostrils daily as needed for allergies or rhinitis.    . furosemide (LASIX) 20 MG tablet Take 1 tablet (20 mg total) by mouth daily. 30 tablet 1  . loperamide (IMODIUM) 2 MG capsule Take 2 mg by mouth 2 (two) times daily as needed for diarrhea or loose stools.     . metoprolol succinate  (TOPROL-XL) 50 MG 24 hr tablet Take 25 mg by mouth daily. Take with or immediately following a meal.    . Omega-3 Fatty Acids (FISH OIL) 1200 MG CAPS Take 1,200 mg by mouth every morning.     Marland Kitchen oxybutynin (DITROPAN-XL) 5 MG 24 hr tablet Take 10 mg by mouth at bedtime.    . pantoprazole (PROTONIX) 40 MG tablet Take 1 tablet (40 mg total) by mouth daily. 30 tablet 0  . rivaroxaban (XARELTO) 20 MG TABS tablet Take 20 mg by mouth daily with supper.    . tiotropium (SPIRIVA) 18 MCG inhalation capsule Place 1 capsule (18 mcg total) into inhaler and inhale daily. (Patient taking differently: Place 18 mcg into inhaler and inhale every morning. ) 30 capsule 12  . traMADol (ULTRAM) 50 MG tablet Take 50-100 mg by mouth 2 (two) times daily as needed (pain).     . VENTOLIN HFA 108 (90 BASE) MCG/ACT inhaler Inhale 2 puffs into the lungs 4 (four) times daily as needed for wheezing or shortness of breath.     . vitamin B-12 (CYANOCOBALAMIN) 500 MCG tablet Take 1,000 mcg by mouth daily.     . iron polysaccharides (NIFEREX) 150 MG capsule Take 1 capsule (150 mg total) by mouth daily. (Patient not taking: Reported on 06/20/2014) 60 capsule 1   No current facility-administered medications for this visit.  Functional Status:   In your present state of health, do you have any difficulty performing the following activities: 06/20/2014 06/20/2014  Is the patient deaf or have difficulty hearing? (No Data) Y  Hearing - N  Vision - Y  Difficulty concentrating or making decisions - Y  Walking or climbing stairs? - N  Doing errands, shopping? - Y  Conservation officer, nature and eating ? - N  Using the Toilet? - N  In the past six months, have you accidently leaked urine? - Y  Do you have problems with loss of bowel control? - N  Managing your Medications? - N  Managing your Finances? - Y  Housekeeping or managing your Housekeeping? - Y    Fall/Depression Screening:    PHQ 2/9 Scores 06/20/2014  PHQ - 2 Score 0    Assessment:  Patient tolerating being up and around her apartment using her rolling walker with continuous O2 on, she was emptying her foley catheter on my arrival to visit. Requested RN to help her with setting up appointments to see her PCP Dr. Lisbeth Ply and Dr. Tammi Klippel as instructed at discharge, called and made appointment, date and time of appointments written on her [facility] calendar, as well as her most recent  AVS per patient request. She voiced concern regarding transportation to appointments, and someone to help her with housework, states her son will come this weekend and vacuum,"i just need a little help with stuff like that,  will alert our [name] of concerns. Patient reports that she is managing taking  her medications well, fills her pill box weekly,will need to continue to monitor her process to ensure proper  med adherence  she did request RN to call [facility] because she needed refills on Tramadol and Diltiazem, completed task, " I like to use them because they deliver". She also requested that I call [facility] as they were arranged at discharge to visit her for [facility] RN and PT, " She states I can use them, clarified the difference in [facility] and [name]. Spoke with Brittney at [facility] states they will follow up with patient on 4/8.  Plan: Schedule follow up visit in  2 weeks, 4/19 at 1000. Reviewed my contact information with the patient and instructed to call if concerns. Will forward today's visit assessment to her PCP.     Joylene Draft, RN, [facility] [phone]

## 2014-06-21 ENCOUNTER — Other Ambulatory Visit: Payer: Self-pay

## 2014-06-21 DIAGNOSIS — D649 Anemia, unspecified: Secondary | ICD-10-CM | POA: Diagnosis not present

## 2014-06-21 DIAGNOSIS — Z466 Encounter for fitting and adjustment of urinary device: Secondary | ICD-10-CM | POA: Diagnosis not present

## 2014-06-21 DIAGNOSIS — I129 Hypertensive chronic kidney disease with stage 1 through stage 4 chronic kidney disease, or unspecified chronic kidney disease: Secondary | ICD-10-CM | POA: Diagnosis not present

## 2014-06-21 DIAGNOSIS — M6281 Muscle weakness (generalized): Secondary | ICD-10-CM | POA: Diagnosis not present

## 2014-06-21 DIAGNOSIS — I480 Paroxysmal atrial fibrillation: Secondary | ICD-10-CM | POA: Diagnosis not present

## 2014-06-21 DIAGNOSIS — J449 Chronic obstructive pulmonary disease, unspecified: Secondary | ICD-10-CM | POA: Diagnosis not present

## 2014-06-21 DIAGNOSIS — R339 Retention of urine, unspecified: Secondary | ICD-10-CM | POA: Diagnosis not present

## 2014-06-21 DIAGNOSIS — I2510 Atherosclerotic heart disease of native coronary artery without angina pectoris: Secondary | ICD-10-CM | POA: Diagnosis not present

## 2014-06-21 DIAGNOSIS — N189 Chronic kidney disease, unspecified: Secondary | ICD-10-CM | POA: Diagnosis not present

## 2014-06-21 NOTE — Patient Outreach (Signed)
[facility]) Care Management  [facility] Social Work  06/21/2014  [name] [name] 04/07/1940 [id number]  Subjective:    Objective:   Current Medications:  Current Outpatient Prescriptions  Medication Sig Dispense Refill  . albuterol (PROVENTIL) (2.5 MG/3ML) 0.083% nebulizer solution Take 2.5 mg by nebulization every 6 (six) hours.    Marland Kitchen atorvastatin (LIPITOR) 40 MG tablet Take 40 mg by mouth every morning.     . budesonide-formoterol (SYMBICORT) 160-4.5 MCG/ACT inhaler Inhale 2 puffs into the lungs 2 (two) times daily. 1 Inhaler 12  . digoxin (LANOXIN) 0.25 MG tablet Take 0.125 mg by mouth every morning.     . diltiazem (CARDIZEM CD) 240 MG 24 hr capsule Take 240 mg by mouth every morning.   1  . docusate sodium (COLACE) 100 MG capsule Take 100 mg by mouth daily as needed (constipation).     . Fe Bisgly-Vit C-Vit B12-FA 28-60-0.008-0.4 MG CAPS Take 1 capsule by mouth every morning.    . fluticasone (FLONASE) 50 MCG/ACT nasal spray Place 1 spray into both nostrils daily as needed for allergies or rhinitis.    . furosemide (LASIX) 20 MG tablet Take 1 tablet (20 mg total) by mouth daily. 30 tablet 1  . iron polysaccharides (NIFEREX) 150 MG capsule Take 1 capsule (150 mg total) by mouth daily. (Patient not taking: Reported on 06/20/2014) 60 capsule 1  . loperamide (IMODIUM) 2 MG capsule Take 2 mg by mouth 2 (two) times daily as needed for diarrhea or loose stools.     . metoprolol succinate (TOPROL-XL) 50 MG 24 hr tablet Take 25 mg by mouth daily. Take with or immediately following a meal.    . Omega-3 Fatty Acids (FISH OIL) 1200 MG CAPS Take 1,200 mg by mouth every morning.     Marland Kitchen oxybutynin (DITROPAN-XL) 5 MG 24 hr tablet Take 10 mg by mouth at bedtime.    . pantoprazole (PROTONIX) 40 MG tablet Take 1 tablet (40 mg total) by mouth daily. 30 tablet 0  . rivaroxaban (XARELTO) 20 MG TABS tablet Take 20 mg by mouth daily with supper.    . tiotropium (SPIRIVA) 18 MCG inhalation capsule  Place 1 capsule (18 mcg total) into inhaler and inhale daily. (Patient taking differently: Place 18 mcg into inhaler and inhale every morning. ) 30 capsule 12  . traMADol (ULTRAM) 50 MG tablet Take 50-100 mg by mouth 2 (two) times daily as needed (pain).     . VENTOLIN HFA 108 (90 BASE) MCG/ACT inhaler Inhale 2 puffs into the lungs 4 (four) times daily as needed for wheezing or shortness of breath.     . vitamin B-12 (CYANOCOBALAMIN) 500 MCG tablet Take 1,000 mcg by mouth daily.      No current facility-administered medications for this visit.    Functional Status:  In your present state of health, do you have any difficulty performing the following activities: 06/20/2014 06/20/2014  Hearing? (No Data) Y  Vision? - N  Difficulty concentrating or making decisions? - Y  Walking or climbing stairs? - Y  Dressing or bathing? - N  Doing errands, shopping? - Y  Conservation officer, nature and eating ? - N  Using the Toilet? - N  In the past six months, have you accidently leaked urine? - Y  Do you have problems with loss of bowel control? - N  Managing your Medications? - N  Managing your Finances? - Y  Housekeeping or managing your Housekeeping? - Y    Fall/Depression Screening:  PHQ 2/9 Scores 06/20/2014  PHQ - 2 Score 0    Assessment: [name] met with [name] [name] for initial assessment in her home. She identified her most urgent need as transportation to an appointment next week on Monday. Patient did not have access to family and appointment was scheduled too early to use [facility]. [name] made referral to [facility] as this was follow-up with doctor post ED visit. Patient also had an appointment in [facility] for which patient could not get access to [facility] transport as she had not yet signed up for transport and still did not have any family or friends to take her. [name] also scheduled for this appointment with [facility]. [name] made plan to have appointments from now on scheduled with [facility] with Medicaid and with  other family members to transport if needed. Patient also identified the need for life alert or similar emergency device. [name] identified [facility] her current home health provider to provide her with Medalert necklace. [name] linked patient with Home Health case manager to set this service up. Patient also requested help with advanced directives. [name] provided advanced directive and gave instruction on how to complete. Patient made plan to meet with son to complete but wasn't sure when it would be finished. Patient also requested help with moving to [facility] to be closer to her support system in her son. She said she has HUD assistance and would like to move to another HUD assistance home. [name] made plans to gether information and present at a future session. [name] identified patient as having medicaid and [employer]. [name] referred patient to [facility] at home program and completed intake assessment with them. [name] made plans for follow-up visit.   Plan: [name] will find available HUD housing and provide patient with list of available apartments in [place]. [name] will assist patient in preparing to move to new apartment. [name] will inform nurse CM of referral to [facility] at Home.

## 2014-06-24 DIAGNOSIS — D303 Benign neoplasm of bladder: Secondary | ICD-10-CM | POA: Diagnosis not present

## 2014-06-24 DIAGNOSIS — Z79899 Other long term (current) drug therapy: Secondary | ICD-10-CM | POA: Diagnosis not present

## 2014-06-24 DIAGNOSIS — D509 Iron deficiency anemia, unspecified: Secondary | ICD-10-CM | POA: Diagnosis not present

## 2014-06-24 DIAGNOSIS — J309 Allergic rhinitis, unspecified: Secondary | ICD-10-CM | POA: Diagnosis not present

## 2014-06-26 DIAGNOSIS — J449 Chronic obstructive pulmonary disease, unspecified: Secondary | ICD-10-CM | POA: Diagnosis not present

## 2014-06-26 DIAGNOSIS — D649 Anemia, unspecified: Secondary | ICD-10-CM | POA: Diagnosis not present

## 2014-06-26 DIAGNOSIS — I480 Paroxysmal atrial fibrillation: Secondary | ICD-10-CM | POA: Diagnosis not present

## 2014-06-26 DIAGNOSIS — M6281 Muscle weakness (generalized): Secondary | ICD-10-CM | POA: Diagnosis not present

## 2014-06-26 DIAGNOSIS — I2510 Atherosclerotic heart disease of native coronary artery without angina pectoris: Secondary | ICD-10-CM | POA: Diagnosis not present

## 2014-06-26 DIAGNOSIS — N189 Chronic kidney disease, unspecified: Secondary | ICD-10-CM | POA: Diagnosis not present

## 2014-06-26 DIAGNOSIS — R339 Retention of urine, unspecified: Secondary | ICD-10-CM | POA: Diagnosis not present

## 2014-06-26 DIAGNOSIS — Z466 Encounter for fitting and adjustment of urinary device: Secondary | ICD-10-CM | POA: Diagnosis not present

## 2014-06-26 DIAGNOSIS — I129 Hypertensive chronic kidney disease with stage 1 through stage 4 chronic kidney disease, or unspecified chronic kidney disease: Secondary | ICD-10-CM | POA: Diagnosis not present

## 2014-06-27 DIAGNOSIS — N281 Cyst of kidney, acquired: Secondary | ICD-10-CM | POA: Diagnosis not present

## 2014-06-27 DIAGNOSIS — R310 Gross hematuria: Secondary | ICD-10-CM | POA: Diagnosis not present

## 2014-06-27 DIAGNOSIS — J449 Chronic obstructive pulmonary disease, unspecified: Secondary | ICD-10-CM | POA: Diagnosis not present

## 2014-06-28 DIAGNOSIS — R339 Retention of urine, unspecified: Secondary | ICD-10-CM | POA: Diagnosis not present

## 2014-06-28 DIAGNOSIS — J449 Chronic obstructive pulmonary disease, unspecified: Secondary | ICD-10-CM | POA: Diagnosis not present

## 2014-06-28 DIAGNOSIS — I129 Hypertensive chronic kidney disease with stage 1 through stage 4 chronic kidney disease, or unspecified chronic kidney disease: Secondary | ICD-10-CM | POA: Diagnosis not present

## 2014-06-28 DIAGNOSIS — I480 Paroxysmal atrial fibrillation: Secondary | ICD-10-CM | POA: Diagnosis not present

## 2014-06-28 DIAGNOSIS — Z466 Encounter for fitting and adjustment of urinary device: Secondary | ICD-10-CM | POA: Diagnosis not present

## 2014-06-28 DIAGNOSIS — M6281 Muscle weakness (generalized): Secondary | ICD-10-CM | POA: Diagnosis not present

## 2014-06-28 DIAGNOSIS — N189 Chronic kidney disease, unspecified: Secondary | ICD-10-CM | POA: Diagnosis not present

## 2014-06-28 DIAGNOSIS — I2510 Atherosclerotic heart disease of native coronary artery without angina pectoris: Secondary | ICD-10-CM | POA: Diagnosis not present

## 2014-06-28 DIAGNOSIS — D649 Anemia, unspecified: Secondary | ICD-10-CM | POA: Diagnosis not present

## 2014-07-02 ENCOUNTER — Encounter: Payer: Self-pay | Admitting: *Deleted

## 2014-07-02 ENCOUNTER — Other Ambulatory Visit: Payer: Self-pay | Admitting: *Deleted

## 2014-07-02 VITALS — BP 138/70 | HR 68 | Resp 18

## 2014-07-02 DIAGNOSIS — Z466 Encounter for fitting and adjustment of urinary device: Secondary | ICD-10-CM | POA: Diagnosis not present

## 2014-07-02 DIAGNOSIS — I480 Paroxysmal atrial fibrillation: Secondary | ICD-10-CM | POA: Diagnosis not present

## 2014-07-02 DIAGNOSIS — J441 Chronic obstructive pulmonary disease with (acute) exacerbation: Secondary | ICD-10-CM

## 2014-07-02 DIAGNOSIS — R339 Retention of urine, unspecified: Secondary | ICD-10-CM | POA: Diagnosis not present

## 2014-07-02 DIAGNOSIS — N189 Chronic kidney disease, unspecified: Secondary | ICD-10-CM | POA: Diagnosis not present

## 2014-07-02 DIAGNOSIS — I129 Hypertensive chronic kidney disease with stage 1 through stage 4 chronic kidney disease, or unspecified chronic kidney disease: Secondary | ICD-10-CM | POA: Diagnosis not present

## 2014-07-02 DIAGNOSIS — M6281 Muscle weakness (generalized): Secondary | ICD-10-CM | POA: Diagnosis not present

## 2014-07-02 DIAGNOSIS — D649 Anemia, unspecified: Secondary | ICD-10-CM | POA: Diagnosis not present

## 2014-07-02 DIAGNOSIS — J449 Chronic obstructive pulmonary disease, unspecified: Secondary | ICD-10-CM | POA: Diagnosis not present

## 2014-07-02 DIAGNOSIS — I2510 Atherosclerotic heart disease of native coronary artery without angina pectoris: Secondary | ICD-10-CM | POA: Diagnosis not present

## 2014-07-02 NOTE — Patient Outreach (Signed)
[name]) Care Management   07/02/2014  [name] [name] 25-Jan-1941 [id number]  [name] [name] is an 74 y.o. female  Subjective: Pt. States "I'm doing real good, the catheter is out and I got a good report from my doctor no cancer".   Objective:   ROS  Physical Exam  Constitutional: She is oriented to person, place, and time.  Cardiovascular: Normal rate and regular rhythm.   Respiratory: She has decreased breath sounds.  Neurological: She is alert and oriented to person, place, and time.  Skin: Skin is warm and dry.  Slight Bilateral lower extremity .  Continuous nasal cannula oxygen at 3 liters  Current Medications:   Current [name] Prescriptions  Medication Sig Dispense Refill  . albuterol (PROVENTIL) (2.5 MG/3ML) 0.083% nebulizer solution Take 2.5 mg by nebulization every 6 (six) hours.    Marland Kitchen atorvastatin (LIPITOR) 40 MG tablet Take 40 mg by mouth every morning.     . budesonide-formoterol (SYMBICORT) 160-4.5 MCG/ACT inhaler Inhale 2 puffs into the lungs 2 (two) times daily. 1 Inhaler 12  . digoxin (LANOXIN) 0.25 MG tablet Take 0.125 mg by mouth every morning.     . diltiazem (CARDIZEM CD) 240 MG 24 hr capsule Take 240 mg by mouth every morning.   1  . docusate sodium (COLACE) 100 MG capsule Take 100 mg by mouth daily as needed (constipation).     . Fe Bisgly-Vit C-Vit B12-FA 28-60-0.008-0.4 MG CAPS Take 1 capsule by mouth every morning.    . fluticasone (FLONASE) 50 MCG/ACT nasal spray Place 1 spray into both nostrils daily as needed for allergies or rhinitis.    . furosemide (LASIX) 20 MG tablet Take 1 tablet (20 mg total) by mouth daily. 30 tablet 1  . loperamide (IMODIUM) 2 MG capsule Take 2 mg by mouth 2 (two) times daily as needed for diarrhea or loose stools.     . metoprolol succinate (TOPROL-XL) 50 MG 24 hr tablet Take 25 mg by mouth daily. Take with or immediately following a meal.    . Omega-3 Fatty Acids (FISH OIL) 1200 MG CAPS Take 1,200 mg  by mouth every morning.     Marland Kitchen oxybutynin (DITROPAN-XL) 5 MG 24 hr tablet Take 10 mg by mouth at bedtime.    . pantoprazole (PROTONIX) 40 MG tablet Take 1 tablet (40 mg total) by mouth daily. 30 tablet 0  . rivaroxaban (XARELTO) 20 MG TABS tablet Take 20 mg by mouth daily with supper.    . tiotropium (SPIRIVA) 18 MCG inhalation capsule Place 1 capsule (18 mcg total) into inhaler and inhale daily. (Patient taking differently: Place 18 mcg into inhaler and inhale every morning. ) 30 capsule 12  . traMADol (ULTRAM) 50 MG tablet Take 50-100 mg by mouth 2 (two) times daily as needed (pain).     . VENTOLIN HFA 108 (90 BASE) MCG/ACT inhaler Inhale 2 puffs into the lungs 4 (four) times daily as needed for wheezing or shortness of breath.     . vitamin B-12 (CYANOCOBALAMIN) 500 MCG tablet Take 1,000 mcg by mouth daily.     . iron polysaccharides (NIFEREX) 150 MG capsule Take 1 capsule (150 mg total) by mouth daily. (Patient not taking: Reported on 06/20/2014) 60 capsule 1   No current facility-administered medications for this visit.    Functional Status:   In your present state of health, do you have any difficulty performing the following activities: 06/20/2014 06/20/2014  Hearing? (No Data) Y  Vision? - N  Difficulty concentrating or making decisions? - Y  Walking or climbing stairs? - Y  Dressing or bathing? - N  Doing errands, shopping? - Y  Conservation officer, nature and eating ? - N  Using the Toilet? - N  In the past six months, have you accidently leaked urine? - Y  Do you have problems with loss of bowel control? - N  Managing your Medications? - N  Managing your Finances? - Y  Housekeeping or managing your Housekeeping? - Y    Fall/Depression Screening:    PHQ 2/9 Scores 06/20/2014  PHQ - 2 Score 0    Assessment: On arrival patient sitting on her sofa,finishing her lunch, reports a good appetite. Progressing well with safe mobility using her rollator walker with seat,reports that she walks along the  sidewalk outside her apartment daily " I'm getting stronger". She still has [facility] by care [place] following.  Patient requested that I help her set up appointment with her heart doctor, she showed me that she has approval paperwork for [facility] transportation.I called and scheduled appointment with Dr. Einar Gip, Monday May 2 at 0930, I also called [facility] to place patient on the transportation list for that day, date and time of appointment  written on pt [facility] calendar as well as [facility] contact number.  Plan: Reviewed COPD zones  actions plans,  fall prevention and urinary problems to notify the MD of. Follow up appointment scheduled for May, 17 at 1000. Reviewed my contact information with the patient and instructed to call for concerns.   Joylene Draft, RN, [facility] [phone]

## 2014-07-03 DIAGNOSIS — J449 Chronic obstructive pulmonary disease, unspecified: Secondary | ICD-10-CM | POA: Diagnosis not present

## 2014-07-03 DIAGNOSIS — N189 Chronic kidney disease, unspecified: Secondary | ICD-10-CM | POA: Diagnosis not present

## 2014-07-03 DIAGNOSIS — D649 Anemia, unspecified: Secondary | ICD-10-CM | POA: Diagnosis not present

## 2014-07-03 DIAGNOSIS — M6281 Muscle weakness (generalized): Secondary | ICD-10-CM | POA: Diagnosis not present

## 2014-07-03 DIAGNOSIS — I480 Paroxysmal atrial fibrillation: Secondary | ICD-10-CM | POA: Diagnosis not present

## 2014-07-03 DIAGNOSIS — I2510 Atherosclerotic heart disease of native coronary artery without angina pectoris: Secondary | ICD-10-CM | POA: Diagnosis not present

## 2014-07-03 DIAGNOSIS — Z466 Encounter for fitting and adjustment of urinary device: Secondary | ICD-10-CM | POA: Diagnosis not present

## 2014-07-03 DIAGNOSIS — I129 Hypertensive chronic kidney disease with stage 1 through stage 4 chronic kidney disease, or unspecified chronic kidney disease: Secondary | ICD-10-CM | POA: Diagnosis not present

## 2014-07-03 DIAGNOSIS — R339 Retention of urine, unspecified: Secondary | ICD-10-CM | POA: Diagnosis not present

## 2014-07-04 DIAGNOSIS — M6281 Muscle weakness (generalized): Secondary | ICD-10-CM | POA: Diagnosis not present

## 2014-07-04 DIAGNOSIS — D649 Anemia, unspecified: Secondary | ICD-10-CM | POA: Diagnosis not present

## 2014-07-04 DIAGNOSIS — Z466 Encounter for fitting and adjustment of urinary device: Secondary | ICD-10-CM | POA: Diagnosis not present

## 2014-07-04 DIAGNOSIS — N189 Chronic kidney disease, unspecified: Secondary | ICD-10-CM | POA: Diagnosis not present

## 2014-07-04 DIAGNOSIS — I2510 Atherosclerotic heart disease of native coronary artery without angina pectoris: Secondary | ICD-10-CM | POA: Diagnosis not present

## 2014-07-04 DIAGNOSIS — I129 Hypertensive chronic kidney disease with stage 1 through stage 4 chronic kidney disease, or unspecified chronic kidney disease: Secondary | ICD-10-CM | POA: Diagnosis not present

## 2014-07-04 DIAGNOSIS — I480 Paroxysmal atrial fibrillation: Secondary | ICD-10-CM | POA: Diagnosis not present

## 2014-07-04 DIAGNOSIS — J449 Chronic obstructive pulmonary disease, unspecified: Secondary | ICD-10-CM | POA: Diagnosis not present

## 2014-07-04 DIAGNOSIS — R339 Retention of urine, unspecified: Secondary | ICD-10-CM | POA: Diagnosis not present

## 2014-07-08 ENCOUNTER — Other Ambulatory Visit: Payer: Self-pay

## 2014-07-09 DIAGNOSIS — J449 Chronic obstructive pulmonary disease, unspecified: Secondary | ICD-10-CM | POA: Diagnosis not present

## 2014-07-09 DIAGNOSIS — Z466 Encounter for fitting and adjustment of urinary device: Secondary | ICD-10-CM | POA: Diagnosis not present

## 2014-07-09 DIAGNOSIS — M6281 Muscle weakness (generalized): Secondary | ICD-10-CM | POA: Diagnosis not present

## 2014-07-09 DIAGNOSIS — R339 Retention of urine, unspecified: Secondary | ICD-10-CM | POA: Diagnosis not present

## 2014-07-09 DIAGNOSIS — I480 Paroxysmal atrial fibrillation: Secondary | ICD-10-CM | POA: Diagnosis not present

## 2014-07-09 DIAGNOSIS — N189 Chronic kidney disease, unspecified: Secondary | ICD-10-CM | POA: Diagnosis not present

## 2014-07-09 DIAGNOSIS — I129 Hypertensive chronic kidney disease with stage 1 through stage 4 chronic kidney disease, or unspecified chronic kidney disease: Secondary | ICD-10-CM | POA: Diagnosis not present

## 2014-07-09 DIAGNOSIS — D649 Anemia, unspecified: Secondary | ICD-10-CM | POA: Diagnosis not present

## 2014-07-09 DIAGNOSIS — I2510 Atherosclerotic heart disease of native coronary artery without angina pectoris: Secondary | ICD-10-CM | POA: Diagnosis not present

## 2014-07-09 NOTE — Patient Outreach (Signed)
[facility]) Care Management  [facility] Social Work  07/09/2014  [name] [name] 24-Oct-1940 [id number]  Subjective:    Objective:   Current Medications:  Current Outpatient Prescriptions  Medication Sig Dispense Refill  . albuterol (PROVENTIL) (2.5 MG/3ML) 0.083% nebulizer solution Take 2.5 mg by nebulization every 6 (six) hours.    Marland Kitchen atorvastatin (LIPITOR) 40 MG tablet Take 40 mg by mouth every morning.     . budesonide-formoterol (SYMBICORT) 160-4.5 MCG/ACT inhaler Inhale 2 puffs into the lungs 2 (two) times daily. 1 Inhaler 12  . digoxin (LANOXIN) 0.25 MG tablet Take 0.125 mg by mouth every morning.     . diltiazem (CARDIZEM CD) 240 MG 24 hr capsule Take 240 mg by mouth every morning.   1  . docusate sodium (COLACE) 100 MG capsule Take 100 mg by mouth daily as needed (constipation).     . Fe Bisgly-Vit C-Vit B12-FA 28-60-0.008-0.4 MG CAPS Take 1 capsule by mouth every morning.    . fluticasone (FLONASE) 50 MCG/ACT nasal spray Place 1 spray into both nostrils daily as needed for allergies or rhinitis.    . furosemide (LASIX) 20 MG tablet Take 1 tablet (20 mg total) by mouth daily. 30 tablet 1  . iron polysaccharides (NIFEREX) 150 MG capsule Take 1 capsule (150 mg total) by mouth daily. (Patient not taking: Reported on 06/20/2014) 60 capsule 1  . loperamide (IMODIUM) 2 MG capsule Take 2 mg by mouth 2 (two) times daily as needed for diarrhea or loose stools.     . metoprolol succinate (TOPROL-XL) 50 MG 24 hr tablet Take 25 mg by mouth daily. Take with or immediately following a meal.    . Omega-3 Fatty Acids (FISH OIL) 1200 MG CAPS Take 1,200 mg by mouth every morning.     Marland Kitchen oxybutynin (DITROPAN-XL) 5 MG 24 hr tablet Take 10 mg by mouth at bedtime.    . pantoprazole (PROTONIX) 40 MG tablet Take 1 tablet (40 mg total) by mouth daily. 30 tablet 0  . rivaroxaban (XARELTO) 20 MG TABS tablet Take 20 mg by mouth daily with supper.    . tiotropium (SPIRIVA) 18 MCG inhalation  capsule Place 1 capsule (18 mcg total) into inhaler and inhale daily. (Patient taking differently: Place 18 mcg into inhaler and inhale every morning. ) 30 capsule 12  . traMADol (ULTRAM) 50 MG tablet Take 50-100 mg by mouth 2 (two) times daily as needed (pain).     . VENTOLIN HFA 108 (90 BASE) MCG/ACT inhaler Inhale 2 puffs into the lungs 4 (four) times daily as needed for wheezing or shortness of breath.     . vitamin B-12 (CYANOCOBALAMIN) 500 MCG tablet Take 1,000 mcg by mouth daily.      No current facility-administered medications for this visit.    Functional Status:  In your present state of health, do you have any difficulty performing the following activities: 06/20/2014 06/20/2014  Hearing? (No Data) Y  Vision? - N  Difficulty concentrating or making decisions? - Y  Walking or climbing stairs? - Y  Dressing or bathing? - N  Doing errands, shopping? - Y  Conservation officer, nature and eating ? - N  Using the Toilet? - N  In the past six months, have you accidently leaked urine? - Y  Do you have problems with loss of bowel control? - N  Managing your Medications? - N  Managing your Finances? - Y  Housekeeping or managing your Housekeeping? - Y    Fall/Depression Screening:  PHQ 2/9 Scores 06/20/2014  PHQ - 2 Score 0    Assessment: [name] met with patient to get update on care plan goals and other referrals. [name] confirmed patient was approved for [facility] at Home. Patient mentioned that she had not yet gotten a visit so [name] called and left a message with assigned case manager. [name] also checked on advanced directive which patient said she would meet with her son and discuss within one week. [name] also discussed potential housing resources. [name] called and helped set up appointment with Cindra Eves at [facility] to help find senior apartments in [place] that meet patient's needs. [name] helped identify several section 8 apartments that were listed as available.   Plan: [name] made plans to meet again  with patient to verify that [employer] at [employer] has started. Once this service has begun [name] will discharge patient as she will be provided with services ongoing through [facility]. [name] will also check on progress with advanced directives and make sure patient in linked with [facility] to assist with finding senior apartment in [place].

## 2014-07-10 DIAGNOSIS — J449 Chronic obstructive pulmonary disease, unspecified: Secondary | ICD-10-CM | POA: Diagnosis not present

## 2014-07-10 DIAGNOSIS — I480 Paroxysmal atrial fibrillation: Secondary | ICD-10-CM | POA: Diagnosis not present

## 2014-07-10 DIAGNOSIS — Z466 Encounter for fitting and adjustment of urinary device: Secondary | ICD-10-CM | POA: Diagnosis not present

## 2014-07-10 DIAGNOSIS — N189 Chronic kidney disease, unspecified: Secondary | ICD-10-CM | POA: Diagnosis not present

## 2014-07-10 DIAGNOSIS — D649 Anemia, unspecified: Secondary | ICD-10-CM | POA: Diagnosis not present

## 2014-07-10 DIAGNOSIS — I129 Hypertensive chronic kidney disease with stage 1 through stage 4 chronic kidney disease, or unspecified chronic kidney disease: Secondary | ICD-10-CM | POA: Diagnosis not present

## 2014-07-10 DIAGNOSIS — M6281 Muscle weakness (generalized): Secondary | ICD-10-CM | POA: Diagnosis not present

## 2014-07-10 DIAGNOSIS — I2510 Atherosclerotic heart disease of native coronary artery without angina pectoris: Secondary | ICD-10-CM | POA: Diagnosis not present

## 2014-07-10 DIAGNOSIS — R339 Retention of urine, unspecified: Secondary | ICD-10-CM | POA: Diagnosis not present

## 2014-07-11 DIAGNOSIS — I480 Paroxysmal atrial fibrillation: Secondary | ICD-10-CM | POA: Diagnosis not present

## 2014-07-11 DIAGNOSIS — J449 Chronic obstructive pulmonary disease, unspecified: Secondary | ICD-10-CM | POA: Diagnosis not present

## 2014-07-11 DIAGNOSIS — R339 Retention of urine, unspecified: Secondary | ICD-10-CM | POA: Diagnosis not present

## 2014-07-11 DIAGNOSIS — N189 Chronic kidney disease, unspecified: Secondary | ICD-10-CM | POA: Diagnosis not present

## 2014-07-11 DIAGNOSIS — D649 Anemia, unspecified: Secondary | ICD-10-CM | POA: Diagnosis not present

## 2014-07-11 DIAGNOSIS — I129 Hypertensive chronic kidney disease with stage 1 through stage 4 chronic kidney disease, or unspecified chronic kidney disease: Secondary | ICD-10-CM | POA: Diagnosis not present

## 2014-07-11 DIAGNOSIS — Z466 Encounter for fitting and adjustment of urinary device: Secondary | ICD-10-CM | POA: Diagnosis not present

## 2014-07-11 DIAGNOSIS — M6281 Muscle weakness (generalized): Secondary | ICD-10-CM | POA: Diagnosis not present

## 2014-07-11 DIAGNOSIS — I2510 Atherosclerotic heart disease of native coronary artery without angina pectoris: Secondary | ICD-10-CM | POA: Diagnosis not present

## 2014-07-15 ENCOUNTER — Encounter: Payer: Self-pay | Admitting: Cardiology

## 2014-07-15 ENCOUNTER — Encounter: Payer: Self-pay | Admitting: *Deleted

## 2014-07-15 DIAGNOSIS — I480 Paroxysmal atrial fibrillation: Secondary | ICD-10-CM | POA: Diagnosis not present

## 2014-07-15 DIAGNOSIS — I10 Essential (primary) hypertension: Secondary | ICD-10-CM | POA: Diagnosis not present

## 2014-07-15 DIAGNOSIS — Z5309 Procedure and treatment not carried out because of other contraindication: Secondary | ICD-10-CM | POA: Diagnosis not present

## 2014-07-15 DIAGNOSIS — J449 Chronic obstructive pulmonary disease, unspecified: Secondary | ICD-10-CM | POA: Diagnosis not present

## 2014-07-15 DIAGNOSIS — J432 Centrilobular emphysema: Secondary | ICD-10-CM | POA: Diagnosis not present

## 2014-07-16 DIAGNOSIS — J449 Chronic obstructive pulmonary disease, unspecified: Secondary | ICD-10-CM | POA: Diagnosis not present

## 2014-07-16 DIAGNOSIS — Z466 Encounter for fitting and adjustment of urinary device: Secondary | ICD-10-CM | POA: Diagnosis not present

## 2014-07-16 DIAGNOSIS — M6281 Muscle weakness (generalized): Secondary | ICD-10-CM | POA: Diagnosis not present

## 2014-07-16 DIAGNOSIS — I480 Paroxysmal atrial fibrillation: Secondary | ICD-10-CM | POA: Diagnosis not present

## 2014-07-16 DIAGNOSIS — I2510 Atherosclerotic heart disease of native coronary artery without angina pectoris: Secondary | ICD-10-CM | POA: Diagnosis not present

## 2014-07-16 DIAGNOSIS — R339 Retention of urine, unspecified: Secondary | ICD-10-CM | POA: Diagnosis not present

## 2014-07-16 DIAGNOSIS — D649 Anemia, unspecified: Secondary | ICD-10-CM | POA: Diagnosis not present

## 2014-07-16 DIAGNOSIS — N189 Chronic kidney disease, unspecified: Secondary | ICD-10-CM | POA: Diagnosis not present

## 2014-07-16 DIAGNOSIS — I129 Hypertensive chronic kidney disease with stage 1 through stage 4 chronic kidney disease, or unspecified chronic kidney disease: Secondary | ICD-10-CM | POA: Diagnosis not present

## 2014-07-17 DIAGNOSIS — N189 Chronic kidney disease, unspecified: Secondary | ICD-10-CM | POA: Diagnosis not present

## 2014-07-17 DIAGNOSIS — D649 Anemia, unspecified: Secondary | ICD-10-CM | POA: Diagnosis not present

## 2014-07-17 DIAGNOSIS — Z466 Encounter for fitting and adjustment of urinary device: Secondary | ICD-10-CM | POA: Diagnosis not present

## 2014-07-17 DIAGNOSIS — J449 Chronic obstructive pulmonary disease, unspecified: Secondary | ICD-10-CM | POA: Diagnosis not present

## 2014-07-17 DIAGNOSIS — R339 Retention of urine, unspecified: Secondary | ICD-10-CM | POA: Diagnosis not present

## 2014-07-17 DIAGNOSIS — I129 Hypertensive chronic kidney disease with stage 1 through stage 4 chronic kidney disease, or unspecified chronic kidney disease: Secondary | ICD-10-CM | POA: Diagnosis not present

## 2014-07-17 DIAGNOSIS — I2510 Atherosclerotic heart disease of native coronary artery without angina pectoris: Secondary | ICD-10-CM | POA: Diagnosis not present

## 2014-07-17 DIAGNOSIS — M6281 Muscle weakness (generalized): Secondary | ICD-10-CM | POA: Diagnosis not present

## 2014-07-17 DIAGNOSIS — I480 Paroxysmal atrial fibrillation: Secondary | ICD-10-CM | POA: Diagnosis not present

## 2014-07-19 ENCOUNTER — Ambulatory Visit: Payer: Commercial Managed Care - HMO

## 2014-07-23 DIAGNOSIS — Z466 Encounter for fitting and adjustment of urinary device: Secondary | ICD-10-CM | POA: Diagnosis not present

## 2014-07-23 DIAGNOSIS — I480 Paroxysmal atrial fibrillation: Secondary | ICD-10-CM | POA: Diagnosis not present

## 2014-07-23 DIAGNOSIS — R339 Retention of urine, unspecified: Secondary | ICD-10-CM | POA: Diagnosis not present

## 2014-07-23 DIAGNOSIS — D649 Anemia, unspecified: Secondary | ICD-10-CM | POA: Diagnosis not present

## 2014-07-23 DIAGNOSIS — M6281 Muscle weakness (generalized): Secondary | ICD-10-CM | POA: Diagnosis not present

## 2014-07-23 DIAGNOSIS — I2510 Atherosclerotic heart disease of native coronary artery without angina pectoris: Secondary | ICD-10-CM | POA: Diagnosis not present

## 2014-07-23 DIAGNOSIS — I129 Hypertensive chronic kidney disease with stage 1 through stage 4 chronic kidney disease, or unspecified chronic kidney disease: Secondary | ICD-10-CM | POA: Diagnosis not present

## 2014-07-23 DIAGNOSIS — N189 Chronic kidney disease, unspecified: Secondary | ICD-10-CM | POA: Diagnosis not present

## 2014-07-23 DIAGNOSIS — J449 Chronic obstructive pulmonary disease, unspecified: Secondary | ICD-10-CM | POA: Diagnosis not present

## 2014-07-25 DIAGNOSIS — I2510 Atherosclerotic heart disease of native coronary artery without angina pectoris: Secondary | ICD-10-CM | POA: Diagnosis not present

## 2014-07-25 DIAGNOSIS — N189 Chronic kidney disease, unspecified: Secondary | ICD-10-CM | POA: Diagnosis not present

## 2014-07-25 DIAGNOSIS — R339 Retention of urine, unspecified: Secondary | ICD-10-CM | POA: Diagnosis not present

## 2014-07-25 DIAGNOSIS — M6281 Muscle weakness (generalized): Secondary | ICD-10-CM | POA: Diagnosis not present

## 2014-07-25 DIAGNOSIS — J449 Chronic obstructive pulmonary disease, unspecified: Secondary | ICD-10-CM | POA: Diagnosis not present

## 2014-07-25 DIAGNOSIS — I129 Hypertensive chronic kidney disease with stage 1 through stage 4 chronic kidney disease, or unspecified chronic kidney disease: Secondary | ICD-10-CM | POA: Diagnosis not present

## 2014-07-25 DIAGNOSIS — I480 Paroxysmal atrial fibrillation: Secondary | ICD-10-CM | POA: Diagnosis not present

## 2014-07-25 DIAGNOSIS — D649 Anemia, unspecified: Secondary | ICD-10-CM | POA: Diagnosis not present

## 2014-07-25 DIAGNOSIS — Z466 Encounter for fitting and adjustment of urinary device: Secondary | ICD-10-CM | POA: Diagnosis not present

## 2014-07-27 DIAGNOSIS — J449 Chronic obstructive pulmonary disease, unspecified: Secondary | ICD-10-CM | POA: Diagnosis not present

## 2014-07-29 DIAGNOSIS — R339 Retention of urine, unspecified: Secondary | ICD-10-CM | POA: Diagnosis not present

## 2014-07-29 DIAGNOSIS — J449 Chronic obstructive pulmonary disease, unspecified: Secondary | ICD-10-CM | POA: Diagnosis not present

## 2014-07-29 DIAGNOSIS — D649 Anemia, unspecified: Secondary | ICD-10-CM | POA: Diagnosis not present

## 2014-07-29 DIAGNOSIS — Z466 Encounter for fitting and adjustment of urinary device: Secondary | ICD-10-CM | POA: Diagnosis not present

## 2014-07-29 DIAGNOSIS — I129 Hypertensive chronic kidney disease with stage 1 through stage 4 chronic kidney disease, or unspecified chronic kidney disease: Secondary | ICD-10-CM | POA: Diagnosis not present

## 2014-07-29 DIAGNOSIS — I2510 Atherosclerotic heart disease of native coronary artery without angina pectoris: Secondary | ICD-10-CM | POA: Diagnosis not present

## 2014-07-29 DIAGNOSIS — M6281 Muscle weakness (generalized): Secondary | ICD-10-CM | POA: Diagnosis not present

## 2014-07-29 DIAGNOSIS — I480 Paroxysmal atrial fibrillation: Secondary | ICD-10-CM | POA: Diagnosis not present

## 2014-07-29 DIAGNOSIS — N189 Chronic kidney disease, unspecified: Secondary | ICD-10-CM | POA: Diagnosis not present

## 2014-07-30 ENCOUNTER — Other Ambulatory Visit: Payer: Self-pay | Admitting: *Deleted

## 2014-07-30 VITALS — BP 140/80 | HR 77 | Resp 22

## 2014-07-30 DIAGNOSIS — J441 Chronic obstructive pulmonary disease with (acute) exacerbation: Secondary | ICD-10-CM

## 2014-07-30 DIAGNOSIS — R339 Retention of urine, unspecified: Secondary | ICD-10-CM | POA: Diagnosis not present

## 2014-07-30 DIAGNOSIS — Z466 Encounter for fitting and adjustment of urinary device: Secondary | ICD-10-CM | POA: Diagnosis not present

## 2014-07-30 DIAGNOSIS — I2510 Atherosclerotic heart disease of native coronary artery without angina pectoris: Secondary | ICD-10-CM | POA: Diagnosis not present

## 2014-07-30 DIAGNOSIS — M6281 Muscle weakness (generalized): Secondary | ICD-10-CM | POA: Diagnosis not present

## 2014-07-30 DIAGNOSIS — I129 Hypertensive chronic kidney disease with stage 1 through stage 4 chronic kidney disease, or unspecified chronic kidney disease: Secondary | ICD-10-CM | POA: Diagnosis not present

## 2014-07-30 DIAGNOSIS — I480 Paroxysmal atrial fibrillation: Secondary | ICD-10-CM | POA: Diagnosis not present

## 2014-07-30 DIAGNOSIS — D649 Anemia, unspecified: Secondary | ICD-10-CM | POA: Diagnosis not present

## 2014-07-30 DIAGNOSIS — J449 Chronic obstructive pulmonary disease, unspecified: Secondary | ICD-10-CM | POA: Diagnosis not present

## 2014-07-30 DIAGNOSIS — N189 Chronic kidney disease, unspecified: Secondary | ICD-10-CM | POA: Diagnosis not present

## 2014-07-30 NOTE — Patient Outreach (Signed)
[facility]) Care Management  07/30/2014  [name] [name] 08-29-1940 [id number]   Received referral for Pharmacy from Landis Martins, RN, assigned Deanne Coffer, PharmD.  [name]. [place], [place] Management [place] Assistant Phone: [phone] Fax: [phone]

## 2014-07-30 NOTE — Patient Outreach (Signed)
[name] [name]   07/30/2014  [name] [name] [birth year] [id number]  [name] [name] is an 74 y.o. female  Subjective: I'm doing ok today'  Objective:   Review of Systems  Constitutional: Negative.   HENT: Negative.   Eyes: Negative.   Respiratory: Negative.   Cardiovascular: Negative.   Gastrointestinal: Negative.   Genitourinary: Negative.   Musculoskeletal: Negative.   Skin: Negative.   Neurological: Negative.   Psychiatric/Behavioral: Negative.     Physical Exam  Constitutional: She is oriented to person, place, and time. She appears well-developed. She is cooperative.  Cardiovascular: Normal rate, regular rhythm and normal heart sounds.   Respiratory: She has decreased breath sounds.  Decreased breath sounds bilaterally  Neurological: She is alert and oriented to person, place, and time.  Hard of hearing  Skin: Skin is warm, dry and intact.  Psychiatric: She has a normal mood and affect.    Current Medications:   Current Outpatient Prescriptions  Medication Sig Dispense Refill  . albuterol (PROVENTIL) (2.5 MG/3ML) 0.083% nebulizer solution Take 2.5 mg by nebulization every 6 (six) hours.    Marland Kitchen atorvastatin (LIPITOR) 40 MG tablet Take 40 mg by mouth every morning.     . budesonide-formoterol (SYMBICORT) 160-4.5 MCG/ACT inhaler Inhale 2 puffs into the lungs 2 (two) times daily. 1 Inhaler 12  . digoxin (LANOXIN) 0.25 MG tablet Take 0.125 mg by mouth every morning.     . diltiazem (CARDIZEM CD) 240 MG 24 hr capsule Take 240 mg by mouth every morning.   1  . docusate sodium (COLACE) 100 MG capsule Take 100 mg by mouth daily as needed (constipation).     . Fe Bisgly-Vit C-Vit B12-FA 28-60-0.008-0.4 MG CAPS Take 1 capsule by mouth every morning.    . fluticasone (FLONASE) 50 MCG/ACT nasal spray Place 1 spray into both nostrils daily as needed for allergies or rhinitis.    . furosemide (LASIX) 20 MG tablet Take 1 tablet (20 mg total) by mouth  daily. 30 tablet 1  . loperamide (IMODIUM) 2 MG capsule Take 2 mg by mouth 2 (two) times daily as needed for diarrhea or loose stools.     . metoprolol succinate (TOPROL-XL) 50 MG 24 hr tablet Take 25 mg by mouth daily. Take with or immediately following a meal.    . Omega-3 Fatty Acids (FISH OIL) 1200 MG CAPS Take 1,200 mg by mouth every morning.     Marland Kitchen oxybutynin (DITROPAN-XL) 5 MG 24 hr tablet Take 10 mg by mouth at bedtime.    . pantoprazole (PROTONIX) 40 MG tablet Take 1 tablet (40 mg total) by mouth daily. 30 tablet 0  . rivaroxaban (XARELTO) 20 MG TABS tablet Take 20 mg by mouth daily with supper.    . tiotropium (SPIRIVA) 18 MCG inhalation capsule Place 1 capsule (18 mcg total) into inhaler and inhale daily. (Patient taking differently: Place 18 mcg into inhaler and inhale every morning. ) 30 capsule 12  . traMADol (ULTRAM) 50 MG tablet Take 50-100 mg by mouth 2 (two) times daily as needed (pain).     . VENTOLIN HFA 108 (90 BASE) MCG/ACT inhaler Inhale 2 puffs into the lungs 4 (four) times daily as needed for wheezing or shortness of breath.     . vitamin B-12 (CYANOCOBALAMIN) 500 MCG tablet Take 1,000 mcg by mouth daily.     . iron polysaccharides (NIFEREX) 150 MG capsule Take 1 capsule (150 mg total) by mouth daily. (Patient not taking: Reported on  06/20/2014) 60 capsule 1   No current facility-administered medications for this visit.    Functional Status:   In your present state of health, do you have any difficulty performing the following activities: 07/15/2014 06/20/2014  Hearing? N (No Data)  Vision? N -  Difficulty concentrating or making decisions? N -  Walking or climbing stairs? Y -  Dressing or bathing? N -  Doing errands, shopping? N -  Preparing Food and eating ? Y -  Using the Toilet? N -  In the past six months, have you accidently leaked urine? Y -  Do you have problems with loss of bowel control? N -  Managing your Medications? N -  Managing your Finances? N -   Housekeeping or managing your Housekeeping? Y -    Fall/Depression Screening:    PHQ 2/9 Scores 06/20/2014  PHQ - 2 Score 0    Assessment:  Routine home visit, patient up ambulating in living room and arrival. Denies pain or sob, O2 at 3 liters by nasal cannula.Patient states that she is still being followed by [facility] Physical Therapy, but will be discharged on this week. [name] [name] is very pleased with her progress, states she walks outside along sidewalk most days, and very pleased with walking 500 feet with therapy on yesterday.   Patient asking about other options for transportation to her MD appointments in [place] complained that  she has to wait too long on the [facility] system and was embarrassed  due to her incontinence problem.  Will consult with LCSW regarding other resources. I also discussed with patient the option of her son or daughter in law being available to transport her for appointments out of town, she doesn't like to ask them to take off work all the time,she does state that her daughter in law is off on Monday's and if there was no other option she would just make her future out of town appointments on Monday, I will follow up with her in 2 weeks to assist with making MD appointments as she cancelled her appointments for October due to her transportation issue.  Patient manages her medication filling a daily pill box, she requested that I call her local drug store for refills on her medication and I did so. [name] [name] is on greater than 10 medications discussed [employer] mail order prescription plan patient states she will consider. Pharmacy consult placed to review medication .  Plan: Return home visit on May, 31 at 12 noon.  Joylene Draft, RN, [place] [name] [phone]

## 2014-08-01 ENCOUNTER — Other Ambulatory Visit: Payer: Self-pay | Admitting: *Deleted

## 2014-08-01 DIAGNOSIS — I480 Paroxysmal atrial fibrillation: Secondary | ICD-10-CM | POA: Diagnosis not present

## 2014-08-01 DIAGNOSIS — N189 Chronic kidney disease, unspecified: Secondary | ICD-10-CM | POA: Diagnosis not present

## 2014-08-01 DIAGNOSIS — I129 Hypertensive chronic kidney disease with stage 1 through stage 4 chronic kidney disease, or unspecified chronic kidney disease: Secondary | ICD-10-CM | POA: Diagnosis not present

## 2014-08-01 DIAGNOSIS — R339 Retention of urine, unspecified: Secondary | ICD-10-CM | POA: Diagnosis not present

## 2014-08-01 DIAGNOSIS — I2510 Atherosclerotic heart disease of native coronary artery without angina pectoris: Secondary | ICD-10-CM | POA: Diagnosis not present

## 2014-08-01 DIAGNOSIS — Z466 Encounter for fitting and adjustment of urinary device: Secondary | ICD-10-CM | POA: Diagnosis not present

## 2014-08-01 DIAGNOSIS — J449 Chronic obstructive pulmonary disease, unspecified: Secondary | ICD-10-CM | POA: Diagnosis not present

## 2014-08-01 DIAGNOSIS — D649 Anemia, unspecified: Secondary | ICD-10-CM | POA: Diagnosis not present

## 2014-08-01 DIAGNOSIS — M6281 Muscle weakness (generalized): Secondary | ICD-10-CM | POA: Diagnosis not present

## 2014-08-01 NOTE — Patient Outreach (Signed)
[facility]) Care Management  08/01/2014  [name] [name] 04/30/1940 [id number]  Phone call to patient to explore transportation needs.  Per she does not want to use [facility] ([place]) due to the long wait times and  her incontinence problem. This Education officer, museum reviewed patient's transpotration options including plan benefit and [facility].  Patient's family members as an option  also discussed.  Per patient, her son and daughter in law both work and are not always available to transport her to appointments out of town.   Collaboration phone call made to [facility] who states that she has spoke to patient as well. Per RNCM, patient has agreed  make her future out of town appointments on Monday's when her daughter in law is off work.  RNCM  will follow up with patient in 2 weeks to assist patient in making MD appointments that were previously cancelled due to transportation issues.   This Education officer, museum also explored patient's housing needs.  Per patient, she has decided to remain in her current residence and has no plans to move at this time.  Patient's case to be closed to social work at this time.   [name] [facility] Care Management [phone]

## 2014-08-06 ENCOUNTER — Other Ambulatory Visit: Payer: Self-pay

## 2014-08-06 NOTE — Patient Outreach (Signed)
I called [name] [name] to see if she was willing to meet and discuss her mail order options.  She stated she would like to talk to her son first since he handles all her medications.  I stated that she may be able to get some of her medications at no cost through the mail order.  She did not realize this.  She state she will talk to her son this weekend.  I asked if I could call her back next week. She stated that was fine.  I will call her on Tuesday, Aug 13, 2014.    Deanne Coffer, PharmD, [place] [phone]

## 2014-08-13 ENCOUNTER — Other Ambulatory Visit: Payer: Self-pay | Admitting: *Deleted

## 2014-08-13 ENCOUNTER — Other Ambulatory Visit: Payer: Self-pay

## 2014-08-13 NOTE — Patient Outreach (Signed)
[facility]) Care Management   08/13/2014  [name] [name] 06/22/1940 [id number]  [name] [name] is an 74 y.o. female  Subjective: "I've got diarrhea'  Objective:   Review of Systems  Constitutional: Negative.   HENT: Negative.   Eyes: Negative.   Respiratory: Negative.   Gastrointestinal: Positive for diarrhea.  Genitourinary: Negative.   Musculoskeletal: Negative.   Neurological: Negative.   Psychiatric/Behavioral: Negative.     Physical Exam  Constitutional: She is oriented to person, place, and time. She appears well-developed.  Cardiovascular: Normal rate, regular rhythm and normal heart sounds.   Respiratory: Breath sounds normal.  GI: Soft.  Neurological: She is alert and oriented to person, place, and time.  Skin: Skin is warm and dry.  Psychiatric: She has a normal mood and affect.    BP 110/60 mmHg  Pulse 77  Resp 20  SpO2 98%   Current Medications:   Current Outpatient Prescriptions  Medication Sig Dispense Refill  . albuterol (PROVENTIL) (2.5 MG/3ML) 0.083% nebulizer solution Take 2.5 mg by nebulization every 6 (six) hours.    Marland Kitchen atorvastatin (LIPITOR) 40 MG tablet Take 40 mg by mouth every morning.     . budesonide-formoterol (SYMBICORT) 160-4.5 MCG/ACT inhaler Inhale 2 puffs into the lungs 2 (two) times daily. 1 Inhaler 12  . digoxin (LANOXIN) 0.25 MG tablet Take 0.125 mg by mouth every morning.     . diltiazem (CARDIZEM CD) 240 MG 24 hr capsule Take 240 mg by mouth every morning.   1  . docusate sodium (COLACE) 100 MG capsule Take 100 mg by mouth daily as needed (constipation).     . Fe Bisgly-Vit C-Vit B12-FA 28-60-0.008-0.4 MG CAPS Take 1 capsule by mouth every morning.    . fluticasone (FLONASE) 50 MCG/ACT nasal spray Place 1 spray into both nostrils daily as needed for allergies or rhinitis.    . furosemide (LASIX) 20 MG tablet Take 1 tablet (20 mg total) by mouth daily. 30 tablet 1  . iron polysaccharides (NIFEREX) 150 MG capsule  Take 1 capsule (150 mg total) by mouth daily. (Patient not taking: Reported on 06/20/2014) 60 capsule 1  . loperamide (IMODIUM) 2 MG capsule Take 2 mg by mouth 2 (two) times daily as needed for diarrhea or loose stools.     . metoprolol succinate (TOPROL-XL) 50 MG 24 hr tablet Take 25 mg by mouth daily. Take with or immediately following a meal.    . Omega-3 Fatty Acids (FISH OIL) 1200 MG CAPS Take 1,200 mg by mouth every morning.     Marland Kitchen oxybutynin (DITROPAN-XL) 5 MG 24 hr tablet Take 10 mg by mouth at bedtime.    . pantoprazole (PROTONIX) 40 MG tablet Take 1 tablet (40 mg total) by mouth daily. 30 tablet 0  . rivaroxaban (XARELTO) 20 MG TABS tablet Take 20 mg by mouth daily with supper.    . tiotropium (SPIRIVA) 18 MCG inhalation capsule Place 1 capsule (18 mcg total) into inhaler and inhale daily. (Patient taking differently: Place 18 mcg into inhaler and inhale every morning. ) 30 capsule 12  . traMADol (ULTRAM) 50 MG tablet Take 50-100 mg by mouth 2 (two) times daily as needed (pain).     . VENTOLIN HFA 108 (90 BASE) MCG/ACT inhaler Inhale 2 puffs into the lungs 4 (four) times daily as needed for wheezing or shortness of breath.     . vitamin B-12 (CYANOCOBALAMIN) 500 MCG tablet Take 1,000 mcg by mouth daily.  No current facility-administered medications for this visit.    Functional Status:   In your present state of health, do you have any difficulty performing the following activities: 07/15/2014 06/20/2014  Hearing? N (No Data)  Vision? N -  Difficulty concentrating or making decisions? N -  Walking or climbing stairs? Y -  Dressing or bathing? N -  Doing errands, shopping? N -  Preparing Food and eating ? Y -  Using the Toilet? N -  In the past six months, have you accidently leaked urine? Y -  Do you have problems with loss of bowel control? N -  Managing your Medications? N -  Managing your Finances? N -  Housekeeping or managing your Housekeeping? Y -    Fall/Depression  Screening:    PHQ 2/9 Scores 06/20/2014  PHQ - 2 Score 0    Assessment:  Arrived at patient home for  routine home visit, patient states that she has diarrhea since last night. Mrs. Knickerbocker reports that she has been taking loperamide as needed and per prior instructions from her Dr. Lisbeth Ply. She denies nausea, vomiting, fever or  chills. Patient reports that her family had a gathering at her home on yesterday and she at some barbeque she questions whether that may have something to do with it. She has only eaten some oatmeal this morning, will try some dry toast later on. Discussed with her to call MD if diarrhea continues on until am   Visit  today was scheduled to assist patient with making a follow up appointment with her cardiologist Dr.Ganji,  she stated he wanted to see her back in his office for further testing of "blood flow in her neck arteries". Mrs. Reek stated that she forgot to mention it to her son on yesterday whether they would be able to provide transportation to appointments, due to the patient not willing to ride [facility] to [employer] appointment.  Mrs Ringel provided me with her son Ardine Bjork contact information so  that I could speak with him regarding transportation to doctor appointments out of town.  Mrs. Roszak reports that she has graduated from home physical therapy and reports increased strength and continues to walk outside as weather permits.She showed me some of the exercises that physical therapy encouraged her to continue to do daily. Mrs.Spisak's states that she is interested in the [facility] mail order prescriptions if it will save her some money and if she doesn't have to call them when she needs a refill. She reports that she was unable to speak with the pharmacist when she called earlier today,due to diarrhea.  Plan:  Make contact with Ardine Bjork, Mrs.Hoke's son regarding being able to provide transportation for her [facility] MD appointments, and assist with making  the appointment. Return home visit  June 13 at 2 pm.  [facility] CM Care Plan Problem One        Patient Outreach from 08/13/2014 in [place] Problem One  Adherence to COPD action plan   Care Plan for Problem One  Active   [facility] Long Term Goal (31-90 days)  Patient will be able to follow the COPD action plan and notify MD for symptoms in the yellow Zones in the next 31 days   [name] Long Term Goal Start Date  05/24/14   [facility] Long Term Goal Met Date  07/30/14   Interventions for Problem One Long Term Goal  -- [verbalized when to call MD before symptoms get worse]  [name] CM Short Term Goal #1 (0-30 days)  Patient will be able to state the symptoms  in the yellow zone that she should call the MD about in the next 30 days   [name] CM Short Term Goal #1 Start Date  05/24/14   [facility] CM Short Term Goal #1 Met Date  06/20/14   Interventions for Short Term Goal #1  -- [able to state 3 symptoms of yellow zone and action to take]   [name] CM Short Term Goal #2 (0-30 days)  patient will review the COPD material within the next 30 days    [name] CM Short Term Goal #2 Start Date  05/24/14   [facility] CM Short Term Goal #2 Met Date  06/20/14   Interventions for Short Term Goal #2  -- [patient reported she has read, and magnet on refrig]    [facility] CM Care Plan Problem Two        Patient Outreach from 08/13/2014 in [place] Problem Two  [place] for Problem Two  Active   [name] Long Term Goal (31-90) days  Patient will not experience a fall in the next 31 days   [name] Long Term Goal Start Date  05/24/14   [name] Long Term Goal Met Date  06/20/14   [name] CM Short Term Goal #1 (0-30 days)  Patient will use safety equipment consistenly within the next 30 days to decrease fall   [name] CM Short Term Goal #1 Start Date  05/24/14   [facility] CM Short Term Goal #1 Met Date   06/20/14   Interventions for Short Term Goal #2   -- [pt reports using rolling walker at all times]   [name] CM Short Term  Goal #2 (0-30 days)  Pt will discuss  with MD on next appointment about physical therapy for strengthen   [facility] CM Short Term Goal #2 Start Date  05/24/14   [facility] CM Short Term Goal #2 Met Date  06/20/14   Interventions for Short Term Goal #2  -- [[facility], and meeting goals ]    [facility] CM Care Plan Problem Three        Patient Outreach Telephone from 08/01/2014 in [place] Term Goal (31-90) days  Patient will move into new apartment in [place] nearer to family support within 60 days  [Per patient, she has decided to remain at current residence. goal not met]   [name] CM Short Term Goal #1 Met Date  -- [goal not met, patient has decided to remain in her current residence]   [name] CM Short Term Goal #2 Met Date  -- [goal not met, patient has decided to remain in her current residence]               Joylene Draft, [place], [place] Care Management [phone]

## 2014-08-13 NOTE — Patient Outreach (Signed)
I called Ms. Orcutt to discuss mail order with her.  She had requested that I call her today so she would have an opportunity to discuss with her son.  I began my conversation with her and after about 5 minutes she asked if I would call her back later today.  She had to go to the bathroom.  I stated I would.  Landis Martins, RN, nurse care manager has an appointment with her today as well.  I will touch base with Maudie Mercury so she can also discuss mail order with Ms. Derstine.  I will follow up with Ms. Caudillo later today.   Deanne Coffer, PharmD, [place] [phone]

## 2014-08-14 ENCOUNTER — Other Ambulatory Visit: Payer: Self-pay | Admitting: *Deleted

## 2014-08-14 NOTE — Patient Outreach (Signed)
[facility]) Care Management  08/14/2014  [name] [name] [birth year] [id number]   Outgoing telephone to [name] [name], Mrs. Mckeon's son. I received verbal consent from Mrs.Gallaway to discuss with her son regarding providing transportation to her [facility] Doctor appointments. [name] stated that he and his wife would provide transportation for those out of town appointments. [name] [name] stated that he would visit his mom on Wednesday and make an appointment for her with cardiologist so that he can provide transportation.  Joylene Draft, RN, [place] Care Management [phone]

## 2014-08-15 DIAGNOSIS — J449 Chronic obstructive pulmonary disease, unspecified: Secondary | ICD-10-CM | POA: Diagnosis not present

## 2014-08-26 ENCOUNTER — Other Ambulatory Visit: Payer: Self-pay | Admitting: *Deleted

## 2014-08-26 NOTE — Patient Outreach (Signed)
[facility]) Care Management   08/26/2014  [name] [name] 16-Jul-1940 [id number]  [name] [name] is an 74 y.o. female  Subjective: " I Guess I'm doing ok"  Objective:   ROS  Physical Exam  Constitutional: She is oriented to person, place, and time. She is cooperative.  HENT:  Hard of hearing  Cardiovascular: Regular rhythm.   Respiratory: She has decreased breath sounds.  Neurological: She is alert and oriented to person, place, and time.  Skin: Skin is warm and dry.  Psychiatric: She has a normal mood and affect.    Current Medications:   Current Outpatient Prescriptions  Medication Sig Dispense Refill  . albuterol (PROVENTIL) (2.5 MG/3ML) 0.083% nebulizer solution Take 2.5 mg by nebulization every 6 (six) hours as needed.     Marland Kitchen atorvastatin (LIPITOR) 40 MG tablet Take 40 mg by mouth every morning.     . budesonide-formoterol (SYMBICORT) 160-4.5 MCG/ACT inhaler Inhale 2 puffs into the lungs 2 (two) times daily. 1 Inhaler 12  . digoxin (LANOXIN) 0.25 MG tablet Take 0.125 mg by mouth every morning.     . diltiazem (CARDIZEM CD) 240 MG 24 hr capsule Take 240 mg by mouth every morning.   1  . docusate sodium (COLACE) 100 MG capsule Take 100 mg by mouth daily as needed (constipation).     . Fe Bisgly-Vit C-Vit B12-FA 28-60-0.008-0.4 MG CAPS Take 1 capsule by mouth every morning.    . fluticasone (FLONASE) 50 MCG/ACT nasal spray Place 1 spray into both nostrils daily as needed for allergies or rhinitis.    . furosemide (LASIX) 20 MG tablet Take 1 tablet (20 mg total) by mouth daily. 30 tablet 1  . ipratropium-albuterol (DUONEB) 0.5-2.5 (3) MG/3ML SOLN Take 3 mLs by nebulization. 4 times daily    . metoprolol succinate (TOPROL-XL) 50 MG 24 hr tablet Take 25 mg by mouth daily. Take with or immediately following a meal.    . Omega-3 Fatty Acids (FISH OIL) 1200 MG CAPS Take 1,200 mg by mouth every morning.     Marland Kitchen oxybutynin (DITROPAN-XL) 5 MG 24 hr tablet Take 10 mg by  mouth at bedtime.    . rivaroxaban (XARELTO) 20 MG TABS tablet Take 20 mg by mouth daily with supper.    . tiotropium (SPIRIVA) 18 MCG inhalation capsule Place 1 capsule (18 mcg total) into inhaler and inhale daily. (Patient taking differently: Place 18 mcg into inhaler and inhale every morning. ) 30 capsule 12  . traMADol (ULTRAM) 50 MG tablet Take 50-100 mg by mouth 2 (two) times daily as needed (pain).     . VENTOLIN HFA 108 (90 BASE) MCG/ACT inhaler Inhale 2 puffs into the lungs 4 (four) times daily as needed for wheezing or shortness of breath.     . vitamin B-12 (CYANOCOBALAMIN) 500 MCG tablet Take 1,000 mcg by mouth daily.     . iron polysaccharides (NIFEREX) 150 MG capsule Take 1 capsule (150 mg total) by mouth daily. (Patient not taking: Reported on 06/20/2014) 60 capsule 1  . loperamide (IMODIUM) 2 MG capsule Take 2 mg by mouth 2 (two) times daily as needed for diarrhea or loose stools.     . pantoprazole (PROTONIX) 40 MG tablet Take 1 tablet (40 mg total) by mouth daily. (Patient not taking: Reported on 08/26/2014) 30 tablet 0   No current facility-administered medications for this visit.    Functional Status:   In your present state of health, do you have any difficulty performing  the following activities: 07/15/2014 06/20/2014  Hearing? N (No Data)  Vision? N -  Difficulty concentrating or making decisions? N -  Walking or climbing stairs? Y -  Dressing or bathing? N -  Doing errands, shopping? N -  Preparing Food and eating ? Y -  Using the Toilet? N -  In the past six months, have you accidently leaked urine? Y -  Do you have problems with loss of bowel control? N -  Managing your Medications? N -  Managing your Finances? N -  Housekeeping or managing your Housekeeping? Y -    Fall/Depression Screening:    PHQ 2/9 Scores 06/20/2014  PHQ - 2 Score 0    Assessment:  Routine home visit. Pt resting on couch on arrival, continuous Oxygen by nasal cannula supplied by [facility]. Mrs Kerekes voiced being upset about the cost of nebulizer treatment (ipratropium-albuterol) from her pharmacy [facility], she states that it used to cost $7.00 when her prescription was being filled at [facility] and  she used to get it delivered to her home by [employer] without paying, from [employer] [facility] from [place]. Mrs. Boyan gave me verbal consent to contact [place], while on speaker phone, [employer] verified patients ID  representative on the phone states that the account was closed now unable to give further explanation. Mrs. Montalban states that she has discussed getting medication by [facility] mail order with her son and they feel  that it is best to stay with [facility] drug, although at today's visit Mrs. Steffensen wants pharmacist to check into if it cost less to get respiratory nebulizer medications by mail order, " You see I'm on a fixed income"  Plan : Will consult [facility] pharmacist for assistance with patient concern regarding cost of respiratory medication. Scheduled routine RN, Care management home visit 09/18/14 at [place], [place], [place] Care Management [phone]

## 2014-08-27 DIAGNOSIS — J449 Chronic obstructive pulmonary disease, unspecified: Secondary | ICD-10-CM | POA: Diagnosis not present

## 2014-09-02 ENCOUNTER — Other Ambulatory Visit: Payer: Self-pay

## 2014-09-02 NOTE — Patient Outreach (Signed)
I called Landis Martins, RN, nurse care manager in regards to her question about albuterol nebulizer solution being less expensive for Ms. Nemetz if she received it through mail order.  Currently, Ms. Ra gets it from [facility] and it should only cost her $1.20 for a month supply since she has the [place].  Per the Medicare website, if she got it through [employer], it would cost her $1.20 for the pre-initial time period and then no copay for the initial coverage period.  Without knowing where Ms. Hippe falls it is difficult to determine if it would be any cheaper.  I explained to Maudie Mercury that Ms. Aure or family member should call [employer] [facility] to determine her costs.  I am happy to assist in any further pharmacy needs that may arise.    Deanne Coffer, PharmD, [place] [phone]

## 2014-09-13 ENCOUNTER — Other Ambulatory Visit: Payer: Self-pay | Admitting: Family Medicine

## 2014-09-13 DIAGNOSIS — Z1231 Encounter for screening mammogram for malignant neoplasm of breast: Secondary | ICD-10-CM

## 2014-09-13 DIAGNOSIS — E780 Pure hypercholesterolemia: Secondary | ICD-10-CM | POA: Diagnosis not present

## 2014-09-13 DIAGNOSIS — R7309 Other abnormal glucose: Secondary | ICD-10-CM | POA: Diagnosis not present

## 2014-09-13 DIAGNOSIS — J939 Pneumothorax, unspecified: Secondary | ICD-10-CM

## 2014-09-13 DIAGNOSIS — D509 Iron deficiency anemia, unspecified: Secondary | ICD-10-CM | POA: Diagnosis not present

## 2014-09-13 DIAGNOSIS — E2839 Other primary ovarian failure: Secondary | ICD-10-CM

## 2014-09-13 DIAGNOSIS — R609 Edema, unspecified: Secondary | ICD-10-CM | POA: Diagnosis not present

## 2014-09-13 DIAGNOSIS — E538 Deficiency of other specified B group vitamins: Secondary | ICD-10-CM | POA: Diagnosis not present

## 2014-09-13 DIAGNOSIS — Z139 Encounter for screening, unspecified: Secondary | ICD-10-CM | POA: Diagnosis not present

## 2014-09-13 DIAGNOSIS — I10 Essential (primary) hypertension: Secondary | ICD-10-CM | POA: Diagnosis not present

## 2014-09-13 DIAGNOSIS — I480 Paroxysmal atrial fibrillation: Secondary | ICD-10-CM | POA: Diagnosis not present

## 2014-09-13 HISTORY — DX: Pneumothorax, unspecified: J93.9

## 2014-09-14 ENCOUNTER — Emergency Department (HOSPITAL_COMMUNITY): Payer: Commercial Managed Care - HMO

## 2014-09-14 ENCOUNTER — Encounter (HOSPITAL_COMMUNITY): Payer: Self-pay | Admitting: Emergency Medicine

## 2014-09-14 ENCOUNTER — Inpatient Hospital Stay (HOSPITAL_COMMUNITY)
Admission: EM | Admit: 2014-09-14 | Discharge: 2014-09-19 | DRG: 200 | Disposition: A | Discharge status: Home / Self Care | Payer: Commercial Managed Care - HMO | Attending: Cardiothoracic Surgery | Admitting: Cardiothoracic Surgery

## 2014-09-14 DIAGNOSIS — Z7951 Long term (current) use of inhaled steroids: Secondary | ICD-10-CM | POA: Diagnosis not present

## 2014-09-14 DIAGNOSIS — I129 Hypertensive chronic kidney disease with stage 1 through stage 4 chronic kidney disease, or unspecified chronic kidney disease: Secondary | ICD-10-CM | POA: Diagnosis present

## 2014-09-14 DIAGNOSIS — Z7901 Long term (current) use of anticoagulants: Secondary | ICD-10-CM | POA: Diagnosis not present

## 2014-09-14 DIAGNOSIS — R05 Cough: Secondary | ICD-10-CM | POA: Diagnosis not present

## 2014-09-14 DIAGNOSIS — Z4682 Encounter for fitting and adjustment of non-vascular catheter: Secondary | ICD-10-CM | POA: Diagnosis not present

## 2014-09-14 DIAGNOSIS — E785 Hyperlipidemia, unspecified: Secondary | ICD-10-CM | POA: Diagnosis not present

## 2014-09-14 DIAGNOSIS — N189 Chronic kidney disease, unspecified: Secondary | ICD-10-CM | POA: Diagnosis not present

## 2014-09-14 DIAGNOSIS — J45909 Unspecified asthma, uncomplicated: Secondary | ICD-10-CM | POA: Diagnosis present

## 2014-09-14 DIAGNOSIS — J449 Chronic obstructive pulmonary disease, unspecified: Secondary | ICD-10-CM

## 2014-09-14 DIAGNOSIS — M1990 Unspecified osteoarthritis, unspecified site: Secondary | ICD-10-CM | POA: Diagnosis present

## 2014-09-14 DIAGNOSIS — J982 Interstitial emphysema: Secondary | ICD-10-CM | POA: Diagnosis not present

## 2014-09-14 DIAGNOSIS — Z5989 Other problems related to housing and economic circumstances: Secondary | ICD-10-CM

## 2014-09-14 DIAGNOSIS — J939 Pneumothorax, unspecified: Secondary | ICD-10-CM | POA: Diagnosis not present

## 2014-09-14 DIAGNOSIS — J986 Disorders of diaphragm: Secondary | ICD-10-CM | POA: Diagnosis not present

## 2014-09-14 DIAGNOSIS — J439 Emphysema, unspecified: Secondary | ICD-10-CM | POA: Diagnosis not present

## 2014-09-14 DIAGNOSIS — K219 Gastro-esophageal reflux disease without esophagitis: Secondary | ICD-10-CM | POA: Diagnosis present

## 2014-09-14 DIAGNOSIS — J9383 Other pneumothorax: Secondary | ICD-10-CM | POA: Diagnosis not present

## 2014-09-14 DIAGNOSIS — Z9889 Other specified postprocedural states: Secondary | ICD-10-CM | POA: Diagnosis not present

## 2014-09-14 DIAGNOSIS — Z9981 Dependence on supplemental oxygen: Secondary | ICD-10-CM | POA: Diagnosis not present

## 2014-09-14 DIAGNOSIS — J441 Chronic obstructive pulmonary disease with (acute) exacerbation: Secondary | ICD-10-CM | POA: Diagnosis not present

## 2014-09-14 DIAGNOSIS — R069 Unspecified abnormalities of breathing: Secondary | ICD-10-CM | POA: Diagnosis not present

## 2014-09-14 DIAGNOSIS — IMO0002 Reserved for concepts with insufficient information to code with codable children: Secondary | ICD-10-CM

## 2014-09-14 DIAGNOSIS — Z598 Other problems related to housing and economic circumstances: Secondary | ICD-10-CM

## 2014-09-14 DIAGNOSIS — R0602 Shortness of breath: Secondary | ICD-10-CM | POA: Diagnosis not present

## 2014-09-14 DIAGNOSIS — J9311 Primary spontaneous pneumothorax: Secondary | ICD-10-CM | POA: Diagnosis not present

## 2014-09-14 DIAGNOSIS — Z9689 Presence of other specified functional implants: Secondary | ICD-10-CM

## 2014-09-14 DIAGNOSIS — I480 Paroxysmal atrial fibrillation: Secondary | ICD-10-CM | POA: Diagnosis present

## 2014-09-14 DIAGNOSIS — Z79899 Other long term (current) drug therapy: Secondary | ICD-10-CM | POA: Diagnosis not present

## 2014-09-14 DIAGNOSIS — Z8249 Family history of ischemic heart disease and other diseases of the circulatory system: Secondary | ICD-10-CM

## 2014-09-14 DIAGNOSIS — Z87891 Personal history of nicotine dependence: Secondary | ICD-10-CM

## 2014-09-14 DIAGNOSIS — J984 Other disorders of lung: Secondary | ICD-10-CM | POA: Diagnosis not present

## 2014-09-14 LAB — BASIC METABOLIC PANEL
Anion gap: 11 (ref 5–15)
BUN: 16 mg/dL (ref 6–20)
CALCIUM: 8.9 mg/dL (ref 8.9–10.3)
CO2: 30 mmol/L (ref 22–32)
CREATININE: 0.67 mg/dL (ref 0.44–1.00)
Chloride: 97 mmol/L — ABNORMAL LOW (ref 101–111)
GFR calc Af Amer: >60 mL/min (ref >60)
GFR calc non Af Amer: >60 mL/min (ref >60)
GLUCOSE: 124 mg/dL — AB (ref 65–99)
Potassium: 3.7 mmol/L (ref 3.5–5.1)
Sodium: 138 mmol/L (ref 135–145)

## 2014-09-14 LAB — CBC WITH DIFFERENTIAL/PLATELET
BASOS PCT: 0 % (ref 0–1)
Basophils Absolute: 0 10*3/uL (ref 0.0–0.1)
EOS ABS: 0 10*3/uL (ref 0.0–0.7)
EOS PCT: 0 % (ref 0–5)
HCT: 41 % (ref 36.0–46.0)
HEMOGLOBIN: 12.8 g/dL (ref 12.0–15.0)
LYMPHS ABS: 0.4 10*3/uL — AB (ref 0.7–4.0)
Lymphocytes Relative: 7 % — ABNORMAL LOW (ref 12–46)
MCH: 25.6 pg — ABNORMAL LOW (ref 26.0–34.0)
MCHC: 31.2 g/dL (ref 30.0–36.0)
MCV: 82 fL (ref 78.0–100.0)
Monocytes Absolute: 0.3 10*3/uL (ref 0.1–1.0)
Monocytes Relative: 5 % (ref 3–12)
Neutro Abs: 5.1 10*3/uL (ref 1.7–7.7)
Neutrophils Relative %: 88 % — ABNORMAL HIGH (ref 43–77)
Platelets: 83 10*3/uL — ABNORMAL LOW (ref 150–400)
RBC: 5 MIL/uL (ref 3.87–5.11)
RDW: 17.2 % — AB (ref 11.5–15.5)
WBC: 5.8 10*3/uL (ref 4.0–10.5)

## 2014-09-14 LAB — MRSA PCR SCREENING: MRSA by PCR: NEGATIVE

## 2014-09-14 LAB — TROPONIN I

## 2014-09-14 LAB — BRAIN NATRIURETIC PEPTIDE: B Natriuretic Peptide: 295.2 pg/mL — ABNORMAL HIGH (ref 0.0–100.0)

## 2014-09-14 MED ORDER — ACETAMINOPHEN 325 MG PO TABS
650.0000 mg | ORAL_TABLET | Freq: Once | ORAL | Status: AC
  Administered 2014-09-14: 650 mg via ORAL
  Filled 2014-09-14: qty 2

## 2014-09-14 MED ORDER — GUAIFENESIN ER 600 MG PO TB12
600.0000 mg | ORAL_TABLET | Freq: Two times a day (BID) | ORAL | Status: DC
  Administered 2014-09-14 – 2014-09-19 (×10): 600 mg via ORAL
  Filled 2014-09-14 (×11): qty 1

## 2014-09-14 MED ORDER — METOPROLOL SUCCINATE ER 25 MG PO TB24
25.0000 mg | ORAL_TABLET | Freq: Every day | ORAL | Status: DC
  Administered 2014-09-14 – 2014-09-19 (×6): 25 mg via ORAL
  Filled 2014-09-14 (×6): qty 1

## 2014-09-14 MED ORDER — DEXTROSE 5 % IV SOLN
1.0000 g | Freq: Three times a day (TID) | INTRAVENOUS | Status: DC
  Administered 2014-09-14 – 2014-09-19 (×15): 1 g via INTRAVENOUS
  Filled 2014-09-14 (×18): qty 1

## 2014-09-14 MED ORDER — FE FUMARATE-B12-VIT C-FA-IFC PO CAPS
1.0000 | ORAL_CAPSULE | Freq: Two times a day (BID) | ORAL | Status: DC
  Administered 2014-09-14 – 2014-09-19 (×10): 1 via ORAL
  Filled 2014-09-14 (×13): qty 1

## 2014-09-14 MED ORDER — SODIUM CHLORIDE 0.9 % IJ SOLN
3.0000 mL | Freq: Two times a day (BID) | INTRAMUSCULAR | Status: DC
  Administered 2014-09-14 – 2014-09-17 (×7): 3 mL via INTRAVENOUS
  Administered 2014-09-17: 10 mL via INTRAVENOUS
  Administered 2014-09-18 – 2014-09-19 (×3): 3 mL via INTRAVENOUS

## 2014-09-14 MED ORDER — FUROSEMIDE 20 MG PO TABS
20.0000 mg | ORAL_TABLET | Freq: Every day | ORAL | Status: DC
  Administered 2014-09-15 – 2014-09-19 (×5): 20 mg via ORAL
  Filled 2014-09-14 (×6): qty 1

## 2014-09-14 MED ORDER — FENTANYL CITRATE (PF) 100 MCG/2ML IJ SOLN
50.0000 ug | Freq: Once | INTRAMUSCULAR | Status: AC
  Administered 2014-09-14: 50 ug via INTRAVENOUS
  Filled 2014-09-14: qty 2

## 2014-09-14 MED ORDER — ACETAMINOPHEN 325 MG PO TABS
650.0000 mg | ORAL_TABLET | Freq: Four times a day (QID) | ORAL | Status: DC | PRN
  Administered 2014-09-18: 650 mg via ORAL
  Filled 2014-09-14: qty 2

## 2014-09-14 MED ORDER — BUDESONIDE-FORMOTEROL FUMARATE 160-4.5 MCG/ACT IN AERO
2.0000 | INHALATION_SPRAY | Freq: Two times a day (BID) | RESPIRATORY_TRACT | Status: DC
  Administered 2014-09-14 – 2014-09-19 (×8): 2 via RESPIRATORY_TRACT
  Filled 2014-09-14 (×2): qty 6

## 2014-09-14 MED ORDER — TRAMADOL HCL 50 MG PO TABS
50.0000 mg | ORAL_TABLET | Freq: Four times a day (QID) | ORAL | Status: DC | PRN
  Administered 2014-09-14 – 2014-09-15 (×2): 50 mg via ORAL
  Filled 2014-09-14 (×2): qty 1

## 2014-09-14 MED ORDER — LIDOCAINE HCL (PF) 1 % IJ SOLN
10.0000 mL | Freq: Once | INTRAMUSCULAR | Status: AC
  Administered 2014-09-14: 10 mL via INTRADERMAL
  Filled 2014-09-14: qty 10

## 2014-09-14 MED ORDER — ONDANSETRON HCL 4 MG PO TABS: 4.0000 mg | ORAL_TABLET | Freq: Four times a day (QID) | ORAL | Status: DC | PRN

## 2014-09-14 MED ORDER — HYDROCODONE-ACETAMINOPHEN 5-325 MG PO TABS
1.0000 | ORAL_TABLET | ORAL | Status: DC | PRN
  Administered 2014-09-14 (×2): 1 via ORAL
  Administered 2014-09-15: 2 via ORAL
  Administered 2014-09-15: 1 via ORAL
  Administered 2014-09-15 – 2014-09-18 (×10): 2 via ORAL
  Filled 2014-09-14: qty 1
  Filled 2014-09-14: qty 2
  Filled 2014-09-14: qty 1
  Filled 2014-09-14: qty 2
  Filled 2014-09-14: qty 1
  Filled 2014-09-14 (×9): qty 2

## 2014-09-14 MED ORDER — ACETAMINOPHEN 650 MG RE SUPP: 650.0000 mg | Freq: Four times a day (QID) | RECTAL | Status: DC | PRN

## 2014-09-14 MED ORDER — DILTIAZEM HCL ER BEADS 240 MG PO CP24
240.0000 mg | ORAL_CAPSULE | Freq: Every day | ORAL | Status: DC
  Administered 2014-09-15 – 2014-09-19 (×5): 240 mg via ORAL
  Filled 2014-09-14 (×6): qty 1

## 2014-09-14 MED ORDER — TIOTROPIUM BROMIDE MONOHYDRATE 18 MCG IN CAPS: 18.0000 ug | ORAL_CAPSULE | Freq: Every day | RESPIRATORY_TRACT | Status: DC

## 2014-09-14 MED ORDER — ONDANSETRON HCL 4 MG/2ML IJ SOLN: 4.0000 mg | Freq: Four times a day (QID) | INTRAMUSCULAR | Status: DC | PRN

## 2014-09-14 MED ORDER — ASPIRIN EC 81 MG PO TBEC
81.0000 mg | DELAYED_RELEASE_TABLET | Freq: Every day | ORAL | Status: DC
  Administered 2014-09-14 – 2014-09-18 (×5): 81 mg via ORAL
  Filled 2014-09-14 (×6): qty 1

## 2014-09-14 MED ORDER — PANTOPRAZOLE SODIUM 40 MG PO TBEC
40.0000 mg | DELAYED_RELEASE_TABLET | Freq: Every day | ORAL | Status: DC
  Administered 2014-09-14 – 2014-09-19 (×6): 40 mg via ORAL
  Filled 2014-09-14 (×5): qty 1

## 2014-09-14 MED ORDER — HYDROCOD POLST-CPM POLST ER 10-8 MG/5ML PO SUER
5.0000 mL | Freq: Every evening | ORAL | Status: DC | PRN
  Administered 2014-09-17: 5 mL via ORAL
  Filled 2014-09-14: qty 5

## 2014-09-14 MED ORDER — SODIUM CHLORIDE 0.9 % IV SOLN
250.0000 mL | INTRAVENOUS | Status: DC | PRN
Start: 2014-09-14 — End: 2014-09-19

## 2014-09-14 MED ORDER — OXYBUTYNIN CHLORIDE ER 5 MG PO TB24
5.0000 mg | ORAL_TABLET | Freq: Every day | ORAL | Status: DC
  Administered 2014-09-14 – 2014-09-18 (×5): 5 mg via ORAL
  Filled 2014-09-14 (×6): qty 1

## 2014-09-14 MED ORDER — DOCUSATE SODIUM 100 MG PO CAPS
100.0000 mg | ORAL_CAPSULE | Freq: Two times a day (BID) | ORAL | Status: DC
  Administered 2014-09-14 – 2014-09-19 (×11): 100 mg via ORAL
  Filled 2014-09-14 (×10): qty 1

## 2014-09-14 MED ORDER — RIVAROXABAN 20 MG PO TABS
20.0000 mg | ORAL_TABLET | Freq: Every day | ORAL | Status: DC
  Administered 2014-09-14 – 2014-09-18 (×5): 20 mg via ORAL
  Filled 2014-09-14 (×6): qty 1

## 2014-09-14 MED ORDER — DIGOXIN 125 MCG PO TABS
0.1250 mg | ORAL_TABLET | Freq: Every day | ORAL | Status: DC
  Administered 2014-09-15 – 2014-09-19 (×5): 0.125 mg via ORAL
  Filled 2014-09-14 (×5): qty 1

## 2014-09-14 MED ORDER — SODIUM CHLORIDE 0.9 % IJ SOLN: 3.0000 mL | INTRAMUSCULAR | Status: DC | PRN

## 2014-09-14 MED ORDER — SORBITOL 70 % SOLN
30.0000 mL | Freq: Every day | Status: DC | PRN
  Filled 2014-09-14: qty 30

## 2014-09-14 MED ORDER — LEVALBUTEROL HCL 0.63 MG/3ML IN NEBU: 0.6300 mg | INHALATION_SOLUTION | Freq: Four times a day (QID) | RESPIRATORY_TRACT | Status: DC | PRN

## 2014-09-14 MED ORDER — FLUTICASONE PROPIONATE 50 MCG/ACT NA SUSP
1.0000 | Freq: Every day | NASAL | Status: DC
  Administered 2014-09-14 – 2014-09-19 (×6): 1 via NASAL
  Filled 2014-09-14: qty 16

## 2014-09-14 MED ORDER — MIDAZOLAM HCL 2 MG/2ML IJ SOLN
1.0000 mg | Freq: Once | INTRAMUSCULAR | Status: AC
  Administered 2014-09-14: 0.5 mg via INTRAVENOUS
  Filled 2014-09-14: qty 2

## 2014-09-14 MED ORDER — ALBUTEROL SULFATE (2.5 MG/3ML) 0.083% IN NEBU
5.0000 mg | INHALATION_SOLUTION | Freq: Once | RESPIRATORY_TRACT | Status: AC
  Administered 2014-09-14: 5 mg via RESPIRATORY_TRACT
  Filled 2014-09-14: qty 6

## 2014-09-14 MED ORDER — ATORVASTATIN CALCIUM 40 MG PO TABS
40.0000 mg | ORAL_TABLET | Freq: Every day | ORAL | Status: DC
  Administered 2014-09-15 – 2014-09-18 (×4): 40 mg via ORAL
  Filled 2014-09-14 (×5): qty 1

## 2014-09-14 MED ORDER — IPRATROPIUM-ALBUTEROL 0.5-2.5 (3) MG/3ML IN SOLN
3.0000 mL | RESPIRATORY_TRACT | Status: DC
  Administered 2014-09-14 – 2014-09-15 (×3): 3 mL via RESPIRATORY_TRACT
  Filled 2014-09-14 (×4): qty 3

## 2014-09-14 NOTE — ED Notes (Signed)
Reports cough x 1week; SOB increased x day. On home O2 at 2L continuously; on EMS arrival sats 82% on 2L. Given Albuterol tx, Duoneb tx, and 125mg  Solumedrol. Improved to 94%. On arrival to ED, sats 94% on [place] 2L. Denies pain. Reports continued SOB feeling, resp even and unlabored at this time.

## 2014-09-14 NOTE — ED Provider Notes (Signed)
CSN: [id number]     Arrival date & time 09/14/14  1105 History   First MD Initiated Contact with Patient 09/14/14 1105     Chief Complaint  Patient presents with  . Shortness of Breath  . Cough     (Consider location/radiation/quality/duration/timing/severity/associated sxs/prior Treatment) Patient is a 74 y.o. female presenting with shortness of breath and cough. The history is provided by the patient.  Shortness of Breath Severity:  Moderate Associated symptoms: cough and headaches   Associated symptoms: no abdominal pain, no chest pain and no sore throat   Cough Associated symptoms: headaches and shortness of breath   Associated symptoms: no chest pain and no sore throat    patient recently shortness of breath and cough. Began around a week ago. States she's been coughing with some white sputum. EMS was called today and found patient on her normal oxygen with sats in the 80s. They came up after breathing treatment and steroids. No fevers. Dull headache. Occasional chest pain with coughing. No change in swelling or legs.   Past Medical History  Diagnosis Date  . Hypertension   . Shortness of breath   . Asthma   . COPD (chronic obstructive pulmonary disease)     emphysema    . Pneumonia     hx pneumonia ,chronic bronchitis   . H/O blood clots   . Chronic kidney disease     current   UTI   being treated  . GERD (gastroesophageal reflux disease)   . Headache(784.0)     hx migraines  . Blood dyscrasia     hx blood clotts  . Arthritis   . Oxygen deficiency   . Hyperlipidemia   . Blood transfusion without reported diagnosis   . Anemia    Past Surgical History  Procedure Laterality Date  . Right leg    . Hip fracture surgery      RIGHT SIDE  . Shoulder surgery      RIGHT  . Cholecystectomy    . Eye surgery      BIL CATARACT   . Orif wrist fracture  10/13/2011    Procedure: OPEN REDUCTION INTERNAL FIXATION (ORIF) WRIST FRACTURE;  Surgeon: [name], MD;  Location: [facility];  Service: Orthopedics;  Laterality: Right;  Open Reduction Internal Fixation Right Distal Radius   . Abdominal hysterectomy    . Btl    . Hardware removal  01/29/2012    Procedure: HARDWARE REMOVAL;  Surgeon: [name], MD;  Location: [place];  Service: Orthopedics;  Laterality: Right;  . Hip arthroplasty  01/29/2012    Procedure: ARTHROPLASTY BIPOLAR HIP;  Surgeon: [name], MD;  Location: [place];  Service: Orthopedics;  Laterality: Right;  . Cardiac catheterization    . Transurethral resection of bladder tumor N/A 06/11/2014    Procedure: TRANSURETHRAL RESECTION OF BLADDER TUMOR (TURBT);  Surgeon: [name], MD;  Location: [facility];  Service: Urology;  Laterality: N/A;  . Cystoscopy w/ retrogrades Bilateral 06/11/2014    Procedure: CYSTOSCOPY WITH RETROGRADE PYELOGRAM;  Surgeon: [name], MD;  Location: [facility];  Service: Urology;  Laterality: Bilateral;   Family History  Problem Relation Age of Onset  . Cancer - Lung Mother   . Coronary artery disease Mother   . Coronary artery disease Sister   . Coronary artery disease Brother    History  Substance Use Topics  . Smoking status: Former Smoker -- 1.00 packs/day for 50 years    Types: Cigarettes  Quit date: 10/27/2013  . Smokeless tobacco: Not on file  . Alcohol Use: No   OB History    No data available     Review of Systems  Constitutional: Negative for appetite change.  HENT: Negative for sore throat.   Respiratory: Positive for cough and shortness of breath.   Cardiovascular: Negative for chest pain.  Gastrointestinal: Negative for abdominal pain.  Genitourinary: Negative for flank pain.  Musculoskeletal: Negative for back pain.  Skin: Negative for wound.  Neurological: Positive for headaches.  Hematological: Negative for adenopathy.      Allergies  Review of patient's allergies indicates no known allergies.  Home Medications   Prior to Admission medications   Medication Sig Start Date End  Date Taking? Authorizing Provider  albuterol (PROVENTIL) (2.5 MG/3ML) 0.083% nebulizer solution Take 2.5 mg by nebulization every 6 (six) hours as needed for wheezing or shortness of breath.    Yes Historical Provider, MD  amoxicillin (AMOXIL) 500 MG capsule Take 1,000 mg by mouth 2 (two) times daily. 09/13/14  Yes Historical Provider, MD  atorvastatin (LIPITOR) 40 MG tablet Take 40 mg by mouth every morning.  07/02/13  Yes Historical Provider, MD  budesonide-formoterol (SYMBICORT) 160-4.5 MCG/ACT inhaler Inhale 2 puffs into the lungs 2 (two) times daily. 08/06/13  Yes Barton Dubois, MD  digoxin (LANOXIN) 0.25 MG tablet Take 0.125 mg by mouth every morning.    Yes Historical Provider, MD  diltiazem (CARDIZEM CD) 240 MG 24 hr capsule Take 240 mg by mouth every morning.  04/08/14  Yes Historical Provider, MD  docusate sodium (COLACE) 100 MG capsule Take 100 mg by mouth daily as needed (constipation).    Yes Historical Provider, MD  Ferrous Sulfate (IRON) 325 (65 FE) MG TABS Take 325 mg by mouth daily.   Yes Historical Provider, MD  furosemide (LASIX) 20 MG tablet Take 1 tablet (20 mg total) by mouth daily. 06/13/14  Yes Theodis Blaze, MD  metoprolol succinate (TOPROL-XL) 50 MG 24 hr tablet Take 12.5 mg by mouth daily. Take with or immediately following a meal.   Yes Historical Provider, MD  Omega-3 Fatty Acids (FISH OIL) 1200 MG CAPS Take 1,200 mg by mouth every morning.    Yes Historical Provider, MD  oxybutynin (DITROPAN-XL) 5 MG 24 hr tablet Take 10 mg by mouth at bedtime.   Yes Historical Provider, MD  pantoprazole (PROTONIX) 40 MG tablet Take 1 tablet (40 mg total) by mouth daily. 04/18/24  Yes Delora Fuel, MD  rivaroxaban (XARELTO) 20 MG TABS tablet Take 20 mg by mouth daily with supper.   Yes Historical Provider, MD  tiotropium (SPIRIVA) 18 MCG inhalation capsule Place 1 capsule (18 mcg total) into inhaler and inhale daily. 08/06/13  Yes Barton Dubois, MD  traMADol (ULTRAM) 50 MG tablet Take 50-100 mg by  mouth 2 (two) times daily as needed (pain).    Yes Historical Provider, MD  VENTOLIN HFA 108 (90 BASE) MCG/ACT inhaler Inhale 2 puffs into the lungs 4 (four) times daily as needed for wheezing or shortness of breath.  06/07/14  Yes Historical Provider, MD  vitamin B-12 (CYANOCOBALAMIN) 500 MCG tablet Take 1,000 mcg by mouth daily.    Yes Historical Provider, MD  fluticasone (FLONASE) 50 MCG/ACT nasal spray Place 1 spray into both nostrils daily as needed for allergies or rhinitis. Patient not taking: Reported on 09/14/2014 08/06/13   Barton Dubois, MD  iron polysaccharides (NIFEREX) 150 MG capsule Take 1 capsule (150 mg total) by mouth daily. Patient  not taking: Reported on 06/20/2014 04/29/14   Annita Brod, MD   BP 156/61 mmHg  Pulse 78  Temp(Src) 98.3 F (36.8 C) (Oral)  Resp 12  Ht 5\' 5"  (1.651 m)  Wt 259 lb 0.7 oz (117.5 kg)  BMI 43.11 kg/m2  SpO2 98% Physical Exam  Constitutional: She appears well-developed.  HENT:  Head: Normocephalic and atraumatic.  Eyes: Pupils are equal, round, and reactive to light.  Neck: Neck supple.  Cardiovascular: Normal rate.   Pulmonary/Chest:  Diffuse wheezes and prolonged expirations.   Abdominal: Soft. There is no tenderness.  Musculoskeletal: She exhibits edema. She exhibits no tenderness.  Mild bilateral LE pitting edema.  Neurological: She is alert.  Skin: Skin is warm.    ED Course  Procedures (including critical care time) Labs Review Labs Reviewed  CBC WITH DIFFERENTIAL/PLATELET - Abnormal; Notable for the following:    MCH 25.6 (*)    RDW 17.2 (*)    Platelets 83 (*)    Neutrophils Relative % 88 (*)    Lymphocytes Relative 7 (*)    Lymphs Abs 0.4 (*)    All other components within normal limits  BRAIN NATRIURETIC PEPTIDE - Abnormal; Notable for the following:    B Natriuretic Peptide 295.2 (*)    All other components within normal limits  BASIC METABOLIC PANEL - Abnormal; Notable for the following:    Chloride 97 (*)     Glucose, Bld 124 (*)    All other components within normal limits  COMPREHENSIVE METABOLIC PANEL - Abnormal; Notable for the following:    Chloride 95 (*)    CO2 34 (*)    Glucose, Bld 134 (*)    Calcium 8.8 (*)    Total Protein 6.3 (*)    Albumin 2.9 (*)    ALT 12 (*)    All other components within normal limits  CBC - Abnormal; Notable for the following:    WBC 14.3 (*)    Hemoglobin 11.8 (*)    MCH 24.9 (*)    RDW 17.3 (*)    Platelets 92 (*)    All other components within normal limits  MRSA PCR SCREENING  TROPONIN I  URINALYSIS, ROUTINE W REFLEX MICROSCOPIC (NOT AT [facility])    Imaging Review Dg Chest 2 View  09/14/2014   CLINICAL DATA:  One week history of cough, shortness of breath and chest tightness. Former smoker who quit approximately 9 months ago. Current history of hypertension, COPD and chronic kidney disease.  EXAM: CHEST  2 VIEW  COMPARISON:  CT chest 04/28/2014. Chest x-rays 04/27/2014 and earlier.  FINDINGS: Right basilar pneumothorax on the order of 30-40% or so. The upper right lung is tethered to the pleural by scar and did not collapse. Surgical suture material in the right upper lobe presumably related to prior bullectomy. Emphysematous changes throughout both lungs and prominent bronchovascular markings diffusely with moderate central peribronchial thickening, unchanged.  Cardiac silhouette upper normal in size. Thoracic aorta atherosclerotic, unchanged. Prominent central pulmonary arteries, unchanged. Degenerative changes throughout the thoracic spine.  IMPRESSION: 1. Moderate size right basilar pneumothorax on the order of 30-40% or so. The upper lung did not collapse and is therefore likely tethered to the pleural by scar. 2.  No acute cardiopulmonary disease otherwise. 3. COPD/emphysema. Critical Value/emergent results were called by telephone at the time of interpretation on 09/14/2014 at 12:48 pm to Dr. Davonna Belling, who verbally acknowledged these results.    Electronically Signed   By: Marcello Moores  Lawrence M.D.   On: 09/14/2014 12:48   Portable Chest 1 View  09/15/2014   CLINICAL DATA:  Short of breath.  EXAM: PORTABLE CHEST - 1 VIEW  COMPARISON:  09/14/2014.  FINDINGS: Cardiopericardial silhouette within normal limits. Aortic arch atherosclerosis. The RIGHT thoracostomy tube has been slightly retracted and the tip is now just inferior to the level of the RIGHT hilum. Apical scarring is present with superior hilar retraction.  Soft tissue emphysema along the RIGHT chest wall is unchanged. Chronic deformity of the proximal RIGHT humerus.  No pneumothorax is visible. Severe emphysema. Pulmonary staples are present at the RIGHT apex adjacent to scarring.  IMPRESSION: 1. Unchanged RIGHT thoracostomy tube.  No pneumothorax. 2. Severe emphysema and bilateral pleural apical scarring similar to prior exam. No superimposed airspace disease or acute cardiopulmonary disease.   Electronically Signed   By: Dereck Ligas M.D.   On: 09/15/2014 08:25   Dg Chest Portable 1 View  09/14/2014   CLINICAL DATA:  Right pneumothorax.  Emphysema.  EXAM: PORTABLE CHEST - 1 VIEW to 11 p.m.  COMPARISON:  09/14/2014 at 11:57 a.m.  FINDINGS: Right chest tube has been inserted. There has been complete relief of the right pneumothorax. Surgical staples at the right apex. Left lung is clear. Heart size and vascularity are normal. Calcification and tortuosity of the thoracic aorta. Subcutaneous emphysema on the right.  IMPRESSION: Right chest tube in good position with complete relief of the right pneumothorax. New right subcutaneous emphysema. Pulmonary emphysema.   Electronically Signed   By: Lorriane Shire M.D.   On: 09/14/2014 14:20     EKG Interpretation   Date/Time:  Saturday September 14 2014 11:14:00 EDT Ventricular Rate:  74 PR Interval:  143 QRS Duration: 91 QT Interval:  548 QTC Calculation: 608 R Axis:   72 Text Interpretation:  Sinus rhythm RSR' in V1 or V2, probably normal   variant Left ventricular hypertrophy Nonspecific T abnormalities, diffuse  leads Prolonged QT interval Confirmed by Alvino Chapel  MD, Francia Verry [phone]) on  09/14/2014 11:30:26 AM      MDM   Final diagnoses:  Pneumothorax on right  Chronic obstructive pulmonary disease, unspecified COPD, unspecified chronic bronchitis type    Patient with shortness of breath. Pneumothorax. Seen by cardiothoracic surgery and admitted.    Davonna Belling, MD 09/15/14 1550

## 2014-09-14 NOTE — ED Notes (Signed)
IV was pulled out in xray

## 2014-09-14 NOTE — Progress Notes (Signed)
Report received from primary RN in ED. Awaiting patient arrival.  

## 2014-09-14 NOTE — H&P (Signed)
[address]       [place] [address]             [phone]        Tenelle J Mcandrew [facility] Medical Record #[id number] Date of Birth: [birth year]  Referring: No ref. provider found Primary Care: Leonides Sake, MD  Chief Complaint:    Chief Complaint  Patient presents with  . Shortness of Breath  . Cough    History of Present Illness:    74 year old patient with advanced COPD on home oxygen presents to the ED with recent symptoms of a upper respiratory infection and severe cough now with acute onset right chest pain and increasing shortness of breath. Chest x-ray shows a 30% right basilar pneumothorax. The patient had a right VATS 10 years ago for a right apical pneumothorax and was treated with bleb resection and pleurodesis of the upper pleural space. She's had no recurrent pneumothorax until today. Her oxygen saturation has dropped to 85-90 percent despite nasal cannula. I was called to see the patient in the emergency department and recommended placement of a urgent right chest tube. The patient has had recent severe cough of purulent material was placed on oral anabiotic's by her primary care physician. She is on multiple inhalers at home as well as Xarelto for history of blood clots. She stopped smoking less than a year ago with 50-pack-year history of cigarettes use.  Current Activity/ Functional Status: Patient lives alone but with family checking on her frequently.  She is marginally safe by  Herself and has sustained falls at home. Zubrod Score: At the time of surgery this patient's most appropriate activity status/level should be described as: []     0    Normal activity, no symptoms []     1    Restricted in physical strenuous activity but ambulatory, able to do out light work []     2    Ambulatory and capable of self care, unable to do work activities, up and about                 more than 50%  Of the time                            [x]     3     Only limited self care, in bed greater than 50% of waking hours []     4    Completely disabled, no self care, confined to bed or chair []     5    Moribund  Past Medical History  Diagnosis Date  . Hypertension   . Shortness of breath   . Asthma   . COPD (chronic obstructive pulmonary disease)     emphysema    . Pneumonia     hx pneumonia ,chronic bronchitis   . H/O blood clots   . Chronic kidney disease     current   UTI   being treated  . GERD (gastroesophageal reflux disease)   . Headache(784.0)     hx migraines  . Blood dyscrasia     hx blood clotts  . Arthritis   . Oxygen deficiency   . Hyperlipidemia   . Blood transfusion without reported diagnosis   . Anemia     Past Surgical History  Procedure Laterality Date  . Right leg    . Hip fracture surgery      RIGHT SIDE  . Shoulder  surgery      RIGHT  . Cholecystectomy    . Eye surgery      BIL CATARACT   . Orif wrist fracture  10/13/2011    Procedure: OPEN REDUCTION INTERNAL FIXATION (ORIF) WRIST FRACTURE;  Surgeon: [name], MD;  Location: [employer] [facility];  Service: Orthopedics;  Laterality: Right;  Open Reduction Internal Fixation Right Distal Radius   . Abdominal hysterectomy    . Btl    . Hardware removal  01/29/2012    Procedure: HARDWARE REMOVAL;  Surgeon: [name], MD;  Location: [place] [facility];  Service: Orthopedics;  Laterality: Right;  . Hip arthroplasty  01/29/2012    Procedure: ARTHROPLASTY BIPOLAR HIP;  Surgeon: [name], MD;  Location: [facility];  Service: Orthopedics;  Laterality: Right;  . Cardiac catheterization    . Transurethral resection of bladder tumor N/A 06/11/2014    Procedure: TRANSURETHRAL RESECTION OF BLADDER TUMOR (TURBT);  Surgeon: [name], MD;  Location: [facility];  Service: Urology;  Laterality: N/A;  . Cystoscopy w/ retrogrades Bilateral 06/11/2014    Procedure: CYSTOSCOPY WITH RETROGRADE PYELOGRAM;  Surgeon: [name], MD;  Location: [facility];  Service: Urology;  Laterality: Bilateral;     History  Smoking status  . Former Smoker -- 1.00 packs/day for 50 years  . Types: Cigarettes  . Quit date: 10/27/2013  Smokeless tobacco  . Not on file    History  Alcohol Use No    History   Social History  . Marital Status: Widowed    Spouse Name: N/A  . Number of Children: N/A  . Years of Education: N/A   Occupational History  . Not on file.   Social History Main Topics  . Smoking status: Former Smoker -- 1.00 packs/day for 50 years    Types: Cigarettes    Quit date: 10/27/2013  . Smokeless tobacco: Not on file  . Alcohol Use: No  . Drug Use: No  . Sexual Activity: Not on file   Other Topics Concern  . Not on file   Social History Narrative    No Known Allergies  Current Facility-Administered Medications  Medication Dose Route Frequency Provider Last Rate Last Dose  . 0.9 %  sodium chloride infusion  250 mL Intravenous PRN Ivin Poot, MD      . acetaminophen (TYLENOL) tablet 650 mg  650 mg Oral Q6H PRN Ivin Poot, MD       Or  . acetaminophen (TYLENOL) suppository 650 mg  650 mg Rectal Q6H PRN Ivin Poot, MD      . aspirin EC tablet 81 mg  81 mg Oral Daily Ivin Poot, MD      . Derrill Memo ON 09/15/2014] atorvastatin (LIPITOR) tablet 40 mg  40 mg Oral q1800 Ivin Poot, MD      . budesonide-formoterol [facility]) 160-4.5 MCG/ACT inhaler 2 puff  2 puff Inhalation BID Ivin Poot, MD      . cefTAZidime (FORTAZ) 1 g in dextrose 5 % 50 mL IVPB  1 g Intravenous 3 times per day Ivin Poot, MD      . chlorpheniramine-HYDROcodone (TUSSIONEX) 10-8 MG/5ML suspension 5 mL  5 mL Oral QHS PRN Ivin Poot, MD      . Derrill Memo ON 09/15/2014] digoxin (LANOXIN) tablet 0.125 mg  0.125 mg Oral Daily Ivin Poot, MD      . Derrill Memo ON 09/15/2014] diltiazem (TIAZAC) 24 hr capsule 240 mg  240 mg Oral Daily Collier Salina  [place] Gum, MD      . docusate sodium (COLACE) capsule 100 mg  100 mg Oral BID Ivin Poot, MD      . ferrous [id number] C-folic  acid (TRINSICON / FOLTRIN) capsule 1 capsule  1 capsule Oral BID PC Ivin Poot, MD      . fluticasone [facility]) 50 MCG/ACT nasal spray 1 spray  1 spray Each Nare Daily Ivin Poot, MD      . Derrill Memo ON 09/15/2014] furosemide (LASIX) tablet 20 mg  20 mg Oral Daily Ivin Poot, MD      . guaiFENesin [facility]) 12 hr tablet 600 mg  600 mg Oral BID Ivin Poot, MD      . HYDROcodone-acetaminophen (NORCO/VICODIN) 5-325 MG per tablet 1-2 tablet  1-2 tablet Oral Q4H PRN Ivin Poot, MD      . ipratropium-albuterol (DUONEB) 0.5-2.5 (3) MG/3ML nebulizer solution 3 mL  3 mL Nebulization Q4H Ivin Poot, MD      . levalbuterol [facility]) nebulizer solution 0.63 mg  0.63 mg Nebulization Q6H PRN Ivin Poot, MD      . metoprolol succinate (TOPROL-XL) 24 hr tablet 25 mg  25 mg Oral Daily Ivin Poot, MD      . ondansetron [facility]) tablet 4 mg  4 mg Oral Q6H PRN Ivin Poot, MD       Or  . ondansetron [facility]) injection 4 mg  4 mg Intravenous Q6H PRN Ivin Poot, MD      . oxybutynin (DITROPAN-XL) 24 hr tablet 5 mg  5 mg Oral QHS Ivin Poot, MD      . pantoprazole (PROTONIX) EC tablet 40 mg  40 mg Oral Q1200 Ivin Poot, MD      . rivaroxaban Alveda Reasons) tablet 20 mg  20 mg Oral QAC supper Tharon Aquas Trigt, MD      . sodium chloride 0.9 % injection 3 mL  3 mL Intravenous Q12H Ivin Poot, MD      . sodium chloride 0.9 % injection 3 mL  3 mL Intravenous PRN Ivin Poot, MD      . sorbitol 70 % solution 30 mL  30 mL Oral Daily PRN Ivin Poot, MD      . traMADol Veatrice Bourbon) tablet 50 mg  50 mg Oral Q6H PRN Ivin Poot, MD        Prescriptions prior to admission  Medication Sig Dispense Refill Last Dose  . albuterol (PROVENTIL) (2.5 MG/3ML) 0.083% nebulizer solution Take 2.5 mg by nebulization every 6 (six) hours as needed for wheezing or shortness of breath.    09/14/2014 at Unknown time  . amoxicillin (AMOXIL) 500 MG capsule Take 1,000 mg by mouth 2 (two)  times daily.   09/13/2014 at Unknown time  . atorvastatin (LIPITOR) 40 MG tablet Take 40 mg by mouth every morning.    09/13/2014 at Unknown time  . budesonide-formoterol (SYMBICORT) 160-4.5 MCG/ACT inhaler Inhale 2 puffs into the lungs 2 (two) times daily. 1 Inhaler 12 09/13/2014 at Unknown time  . digoxin (LANOXIN) 0.25 MG tablet Take 0.125 mg by mouth every morning.    09/13/2014 at Unknown time  . diltiazem (CARDIZEM CD) 240 MG 24 hr capsule Take 240 mg by mouth every morning.   1 09/13/2014 at Unknown time  . docusate sodium (COLACE) 100 MG capsule Take 100 mg by mouth daily as needed (constipation).    09/12/2014  . Ferrous Sulfate (IRON) 325 (  65 FE) MG TABS Take 325 mg by mouth daily.   09/13/2014 at Unknown time  . furosemide (LASIX) 20 MG tablet Take 1 tablet (20 mg total) by mouth daily. 30 tablet 1 09/13/2014 at Unknown time  . metoprolol succinate (TOPROL-XL) 50 MG 24 hr tablet Take 12.5 mg by mouth daily. Take with or immediately following a meal.   09/13/2014 at 800  . Omega-3 Fatty Acids (FISH OIL) 1200 MG CAPS Take 1,200 mg by mouth every morning.    09/13/2014 at Unknown time  . oxybutynin (DITROPAN-XL) 5 MG 24 hr tablet Take 10 mg by mouth at bedtime.   09/13/2014 at Unknown time  . pantoprazole (PROTONIX) 40 MG tablet Take 1 tablet (40 mg total) by mouth daily. 30 tablet 0 09/13/2014 at Unknown time  . rivaroxaban (XARELTO) 20 MG TABS tablet Take 20 mg by mouth daily with supper.   09/13/2014 at Unknown time  . tiotropium (SPIRIVA) 18 MCG inhalation capsule Place 1 capsule (18 mcg total) into inhaler and inhale daily. 30 capsule 12 09/13/2014 at Unknown time  . traMADol (ULTRAM) 50 MG tablet Take 50-100 mg by mouth 2 (two) times daily as needed (pain).    09/13/2014 at Unknown time  . VENTOLIN HFA 108 (90 BASE) MCG/ACT inhaler Inhale 2 puffs into the lungs 4 (four) times daily as needed for wheezing or shortness of breath.    09/14/2014 at Unknown time  . vitamin B-12 (CYANOCOBALAMIN) 500 MCG tablet Take 1,000  mcg by mouth daily.    09/13/2014 at Unknown time  . fluticasone (FLONASE) 50 MCG/ACT nasal spray Place 1 spray into both nostrils daily as needed for allergies or rhinitis. (Patient not taking: Reported on 09/14/2014)   Not Taking at Unknown time  . iron polysaccharides (NIFEREX) 150 MG capsule Take 1 capsule (150 mg total) by mouth daily. (Patient not taking: Reported on 06/20/2014) 60 capsule 1 Not Taking at Unknown time    Family History  Problem Relation Age of Onset  . Cancer - Lung Mother   . Coronary artery disease Mother   . Coronary artery disease Sister   . Coronary artery disease Brother      Review of Systems:  The patient did well after her right VATS and stapling of apical blebs 10 years ago for spontaneous pneumothorax.  The patient had echocardiogram earlier this year showing EF 65%, no significant valvular disease   Cardiac Review of Systems: Y or N  Chest Pain [ yes   ]  Resting SOB [ yes  ] Exertional SOB  yes [  ]  Orthopnea [ yes ]   Pedal Edema [ no  ]    Palpitations [  no] Syncope  [  no]   Presyncope [ no  ]  General Review of Systems: [Y] = yes [  ]=no Constitional: recent weight change [  ]; anorexia [  ]; fatigue [  ]; nausea [  ]; night sweats [  ]; fever [  ]; or chills [  ]                                                               Dental: poor dentition[  ]; Last Dentist visit: Edentulous   Eye : blurred vision [  ]; diplopia [   ];  vision changes [  ];  Amaurosis fugax[  ]; Resp: cough [ yes-productive ];  wheezing[yes-chronic  ];  hemoptysis[  ]; shortness of breath[yes  ]; paroxysmal nocturnal dyspnea[  ]; dyspnea on exertion[ yes ]; or orthopnea[yes  ];  GI:  gallstones[  ], vomiting[  ];  dysphagia[  ]; melena[  ];  hematochezia [  ]; heartburn[  ];   Hx of  Colonoscopy[  ]; GU: kidney stones [ yes ]; hematuria[  ];   dysuria [  ];  nocturia[  ];  history of     obstruction [  ]; urinary frequency [  ]             Skin: rash, swelling[  ];, hair loss[  ];   peripheral edema[  ];  or itching[  ]; Musculosketetal: myalgias[  ];  joint swelling[  ];  joint erythema[  ]; positive for joint pain  joint pain[yes  ];  back pain[  ];  Heme/Lymph: bruising[ yes ];  bleeding[  ];  anemia[  ];  Neuro: TIA[  ];  headaches[yes-migraine  ];  stroke[  ];  vertigo[  ];  seizures[  ];   paresthesias[  ];  difficulty walking[  ];  Psych:depression[  ]; anxiety[yes  ];  Endocrine: diabetes[ no ];  thyroid dysfunction[  ];  Immunizations: Flu [  ]; Pneumococcal[  ];  Other left-hand-dominant  Physical Exam: BP 152/54 mmHg  Pulse 87  Temp(Src) 97.5 F (36.4 C) (Oral)  Resp 24  Ht 5\' 5"  (1.651 m)  Wt 259 lb 0.7 oz (117.5 kg)  BMI 43.11 kg/m2  SpO2 94%      Physical Exam  General: Elderly fragile Caucasian female short of breath at rest accompanied by family HEENT: Normocephalic pupils equal , dentition adequate Neck: Supple without JVD, adenopathy, or bruit Chest: Clear to auscultation, symmetrical breath sounds, no rhonchi, no tenderness             or deformity Cardiovascular: Regular rate and rhythm, no murmur, no gallop, peripheral pulses             palpable in all extremities Abdomen:  Soft, scaphoid, nontender, no palpable mass or organomegaly Extremities: Warm, well-perfused, no clubbing cyanosis edema or tenderness,              no venous stasis changes of the legs Rectal/GU: Deferred Neuro: Grossly non--focal and symmetrical throughout Skin: Clean and dry without rash or ulceration    Diagnostic Studies & Laboratory data:     Recent Radiology Findings:   Dg Chest 2 View  09/14/2014   CLINICAL DATA:  One week history of cough, shortness of breath and chest tightness. Former smoker who quit approximately 9 months ago. Current history of hypertension, COPD and chronic kidney disease.  EXAM: CHEST  2 VIEW  COMPARISON:  CT chest 04/28/2014. Chest x-rays 04/27/2014 and earlier.  FINDINGS: Right basilar pneumothorax on the order of 30-40% or  so. The upper right lung is tethered to the pleural by scar and did not collapse. Surgical suture material in the right upper lobe presumably related to prior bullectomy. Emphysematous changes throughout both lungs and prominent bronchovascular markings diffusely with moderate central peribronchial thickening, unchanged.  Cardiac silhouette upper normal in size. Thoracic aorta atherosclerotic, unchanged. Prominent central pulmonary arteries, unchanged. Degenerative changes throughout the thoracic spine.  IMPRESSION: 1. Moderate size right basilar pneumothorax on the order of 30-40% or so. The upper lung did not collapse and is therefore  likely tethered to the pleural by scar. 2.  No acute cardiopulmonary disease otherwise. 3. COPD/emphysema. Critical Value/emergent results were called by telephone at the time of interpretation on 09/14/2014 at 12:48 pm to Dr. Davonna Belling, who verbally acknowledged these results.   Electronically Signed   By: Evangeline Dakin M.D.   On: 09/14/2014 12:48   Dg Chest Portable 1 View  09/14/2014   CLINICAL DATA:  Right pneumothorax.  Emphysema.  EXAM: PORTABLE CHEST - 1 VIEW to 11 p.m.  COMPARISON:  09/14/2014 at 11:57 a.m.  FINDINGS: Right chest tube has been inserted. There has been complete relief of the right pneumothorax. Surgical staples at the right apex. Left lung is clear. Heart size and vascularity are normal. Calcification and tortuosity of the thoracic aorta. Subcutaneous emphysema on the right.  IMPRESSION: Right chest tube in good position with complete relief of the right pneumothorax. New right subcutaneous emphysema. Pulmonary emphysema.   Electronically Signed   By: Lorriane Shire M.D.   On: 09/14/2014 14:20     I have independently reviewed the above radiologic studies.  Recent Lab Findings: Lab Results  Component Value Date   WBC 5.8 09/14/2014   HGB 12.8 09/14/2014   HCT 41.0 09/14/2014   PLT 83* 09/14/2014   GLUCOSE 124* 09/14/2014   CHOL 102  08/05/2013   TRIG 66 08/05/2013   HDL 47 08/05/2013   LDLCALC 42 08/05/2013   ALT 13 06/08/2014   AST 21 06/08/2014   NA 138 09/14/2014   K 3.7 09/14/2014   CL 97* 09/14/2014   CREATININE 0.67 09/14/2014   BUN 16 09/14/2014   CO2 30 09/14/2014   TSH 2.813 04/27/2014   INR 1.03 06/16/2014      Assessment / Plan:     Recurrent right spontaneous pneumothorax-basilar. Previous history of right apical pneumothorax status post resection of apical bleb disease We'll place right chest tube and admit for observation and chest tube management of spontaneous pneumothorax. The patient is a poor candidate for a second right VATS for pneumothorax.     I  spent 40 minutes counseling the patient face to face and 50% or more the  time was spent in counseling and coordination of care. The total time spent in the appointment was 60 minutes.    @ME1 @ 09/14/2014 3:27 PM

## 2014-09-14 NOTE — Op Note (Signed)
   Procedure-urgent placement of right 20 French chest tube for spontaneous pneumothorax in respiratory distress  Surgeon Tharon Aquas trigt M.D.  Pre-and postoperative diagnosis-history of severe COPD on home oxygen, 30% right  basilar spontaneous pneumothorax  Anesthesia-local 1% lidocaine with IV conscious monitored sedation   Procedure   the patient was evaluated and chest x-ray reviewed in the emergency department. Her oxygen saturation were marginal and urgent right chest tube placement was recommended. The procedure was discussed in detail with the patient and her family including indications benefits and alternatives. Informed consent was obtained. The patient's right chest was marked. The right chest was prepped and draped. A proper timeout was performed. Lidocaine 1% was infiltrated in the right  anterior axillary line at the fifth interspace. A small incision was made. There is more bleeding than usual because of the patient's xarelto  use. More lidocaine was infiltrated in the intercostal muscle. The right pleural space was entered with a hemostat and there is immediate exit of air. A 20 French chest tube on a trocar was positioned into the area the basilar pneumothorax and then directed towards the upper thorax.. This was connected to a underwater seal Pleur-evac. There is good fluctuation of the air-fluid level. The chest tube was secured to the skin with a silk suture and a sterile dressing was applied. Follow-up chest x-ray showed resolution of the right pneumothorax.

## 2014-09-14 NOTE — ED Notes (Signed)
Breathing tx paused. Patient transported to X-ray

## 2014-09-14 NOTE — Progress Notes (Signed)
  Subjective: Patient examined and CXR reviewed Elderly WF with severe COPD on home O2 with recent URI, productive cough and  Started on po antibiotics by Primary MD Sudden onset R chest pain and inc SOB today- to ED by EMT Objective: Vital signs in last 24 hours: Temp:  [98.1 F (36.7 C)] 98.1 F (36.7 C) (07/02 1114) Pulse Rate:  [73-92] 92 (07/02 1358) Cardiac Rhythm:  [-]  Resp:  [15-23] 19 (07/02 1358) BP: (133-162)/(50-87) 160/64 mmHg (07/02 1358) SpO2:  [94 %-97 %] 97 % (07/02 1358) Weight:  [118 lb (53.524 kg)] 118 lb (53.524 kg) (07/02 1114)  Hemodynamic parameters for last 24 hours:  nsr afebrile  Intake/Output from previous day:   Intake/Output this shift: Total I/O In: -  Out: 25 [Chest Tube:25]      Physical Exam  General:  elderly and frail w/ wet cough HEENT: Normocephalic pupils equal , edentulous Neck: Supple without JVD, adenopathy, or bruit Chest- decreased BS at R base  Cardiovascular: Regular rate and rhythm, no murmur, no gallop, peripheral pulses             palpable in all extremities Abdomen:  Soft, nontender, no palpable mass or organomegaly Extremities: Warm, well-perfused, no clubbing cyanosis edema or tenderness,              no venous stasis changes of the legs Rectal/GU: Deferred Neuro: Grossly non--focal and symmetrical throughout Skin: Clean and dry without rash or ulceration   Lab Results:  Recent Labs  09/14/14 1154  WBC 5.8  HGB 12.8  HCT 41.0  PLT 83*   BMET:  Recent Labs  09/14/14 1154  NA 138  K 3.7  CL 97*  CO2 30  GLUCOSE 124*  BUN 16  CREATININE 0.67  CALCIUM 8.9    PT/INR: No results for input(s): LABPROT, INR in the last 72 hours. ABG    Component Value Date/Time   TCO2 39 12/28/2013 1041   CBG (last 3)  No results for input(s): GLUCAP in the last 72 hours.  Assessment/Plan: S/P   R chest tube placement in ED for spontaneous PNTX from violent coughing admit for Right chest tube management of  spont PNTX  IV antibiotics for bronchitis w/ nebs, mucinex Cont xarelto for hx PAF   LOS: 0 days    [name] 09/14/2014

## 2014-09-15 ENCOUNTER — Inpatient Hospital Stay (HOSPITAL_COMMUNITY): Payer: Commercial Managed Care - HMO

## 2014-09-15 LAB — COMPREHENSIVE METABOLIC PANEL
ALT: 12 U/L — ABNORMAL LOW (ref 14–54)
AST: 16 U/L (ref 15–41)
Albumin: 2.9 g/dL — ABNORMAL LOW (ref 3.5–5.0)
Alkaline Phosphatase: 62 U/L (ref 38–126)
Anion gap: 8 (ref 5–15)
BUN: 14 mg/dL (ref 6–20)
CO2: 34 mmol/L — ABNORMAL HIGH (ref 22–32)
Calcium: 8.8 mg/dL — ABNORMAL LOW (ref 8.9–10.3)
Chloride: 95 mmol/L — ABNORMAL LOW (ref 101–111)
Creatinine, Ser: 0.63 mg/dL (ref 0.44–1.00)
GFR calc Af Amer: >60 mL/min (ref >60)
GFR calc non Af Amer: >60 mL/min (ref >60)
Glucose, Bld: 134 mg/dL — ABNORMAL HIGH (ref 65–99)
Potassium: 3.6 mmol/L (ref 3.5–5.1)
Sodium: 137 mmol/L (ref 135–145)
Total Bilirubin: 0.6 mg/dL (ref 0.3–1.2)
Total Protein: 6.3 g/dL — ABNORMAL LOW (ref 6.5–8.1)

## 2014-09-15 LAB — CBC
HCT: 38.7 % (ref 36.0–46.0)
Hemoglobin: 11.8 g/dL — ABNORMAL LOW (ref 12.0–15.0)
MCH: 24.9 pg — ABNORMAL LOW (ref 26.0–34.0)
MCHC: 30.5 g/dL (ref 30.0–36.0)
MCV: 81.6 fL (ref 78.0–100.0)
Platelets: 92 10*3/uL — ABNORMAL LOW (ref 150–400)
RBC: 4.74 MIL/uL (ref 3.87–5.11)
RDW: 17.3 % — ABNORMAL HIGH (ref 11.5–15.5)
WBC: 14.3 10*3/uL — ABNORMAL HIGH (ref 4.0–10.5)

## 2014-09-15 MED ORDER — CETYLPYRIDINIUM CHLORIDE 0.05 % MT LIQD
7.0000 mL | Freq: Two times a day (BID) | OROMUCOSAL | Status: DC
  Administered 2014-09-15 – 2014-09-18 (×8): 7 mL via OROMUCOSAL

## 2014-09-15 MED ORDER — IPRATROPIUM-ALBUTEROL 0.5-2.5 (3) MG/3ML IN SOLN
3.0000 mL | Freq: Four times a day (QID) | RESPIRATORY_TRACT | Status: DC
Start: 2014-09-15 — End: 2014-09-19
  Administered 2014-09-15 – 2014-09-19 (×18): 3 mL via RESPIRATORY_TRACT
  Filled 2014-09-15 (×18): qty 3

## 2014-09-15 MED ORDER — POTASSIUM CHLORIDE CRYS ER 20 MEQ PO TBCR
30.0000 meq | EXTENDED_RELEASE_TABLET | Freq: Once | ORAL | Status: AC
  Administered 2014-09-15: 30 meq via ORAL
  Filled 2014-09-15: qty 1

## 2014-09-15 NOTE — Progress Notes (Addendum)
      [address]       [place] [address]             [phone]            Subjective: Patient with complaints of cough, productive at times  Objective: Vital signs in last 24 hours: Temp:  [97.5 F (36.4 C)-98.2 F (36.8 C)] 97.7 F (36.5 C) (07/03 0726) Pulse Rate:  [68-92] 68 (07/03 0726) Cardiac Rhythm:  [-] Normal sinus rhythm (07/03 0805) Resp:  [14-24] 21 (07/03 0726) BP: (124-162)/(41-101) 159/79 mmHg (07/03 0726) SpO2:  [84 %-98 %] 98 % (07/03 0726) Weight:  [118 lb (53.524 kg)-259 lb 0.7 oz (117.5 kg)] 259 lb 0.7 oz (117.5 kg) (07/02 1510)     Intake/Output from previous day: 07/02 0701 - 07/03 0700 In: 270 [P.O.:120; IV Piggyback:150] Out: 790 [Urine:700; Chest Tube:90]   Physical Exam:  Cardiovascular: RRR. Pulmonary: Clear to auscultation bilaterally; no rales, wheezes, or rhonchi. Abdomen: Soft, non tender, bowel sounds present. Extremities: No  lower extremity edema. Wounds: Clean and dry.  No erythema or signs of infection. Chest Tube: to suction, no air leak  Lab Results: CBC: Recent Labs  09/14/14 1154 09/15/14 0614  WBC 5.8 14.3*  HGB 12.8 11.8*  HCT 41.0 38.7  PLT 83* 92*   BMET:  Recent Labs  09/14/14 1154 09/15/14 0614  NA 138 137  K 3.7 3.6  CL 97* 95*  CO2 30 34*  GLUCOSE 124* 134*  BUN 16 14  CREATININE 0.67 0.63  CALCIUM 8.9 8.8*    PT/INR: No results for input(s): LABPROT, INR in the last 72 hours. ABG:  INR: Will add last result for INR, ABG once components are confirmed Will add last 4 CBG results once components are confirmed  Assessment/Plan:  1. CV - SR in the 60's. 2.  Pulmonary - Chest tube is to suction. There is no air leak. CXR shows subcutaneous emphysema right lateral chest wall, no pneumothorax, and severe emphysema. Possibly place chest tube to water seal. Check CXR in am. Mucinex for cough 3. Supplement potassium  ZIMMERMAN,DONIELLE MPA-C 09/15/2014,10:57 AM  No  pneumothorax on chest x-ray No airleak through chest tube Convert to water seal Monday a.m. Continue anti-microbial because of  wet cough, bronchitis, elevated white count

## 2014-09-16 ENCOUNTER — Inpatient Hospital Stay (HOSPITAL_COMMUNITY): Payer: Commercial Managed Care - HMO

## 2014-09-16 MED ORDER — PREDNISONE 10 MG PO TABS
10.0000 mg | ORAL_TABLET | Freq: Every day | ORAL | Status: DC
  Administered 2014-09-16 – 2014-09-19 (×4): 10 mg via ORAL
  Filled 2014-09-16 (×6): qty 1

## 2014-09-16 NOTE — Care Management (Signed)
Important Message  Patient Details  Name: [name] [name] MRN: [id number] Date of Birth: [birth year]   Medicare Important Message Given:  Yes-second notification given    Loann Quill 09/16/2014, 9:51 AM

## 2014-09-16 NOTE — Progress Notes (Addendum)
       [address]       [place] [address]             [phone]               Subjective: Sore, productive cough with thick sputum.   Objective: Vital signs in last 24 hours: Patient Vitals for the past 24 hrs:  BP Temp Temp src Pulse Resp SpO2  09/16/14 0800 (!) 133/42 mmHg - - 65 (!) 8 93 %  09/16/14 0320 - 98 F (36.7 C) Oral - - -  09/16/14 0319 (!) 149/47 mmHg - - 79 15 94 %  09/15/14 2235 (!) 124/40 mmHg - - 78 (!) 21 94 %  09/15/14 2233 - 97.7 F (36.5 C) Oral - - -  09/15/14 2009 - - - 77 19 94 %  09/15/14 1946 (!) 135/41 mmHg - - 75 14 94 %  09/15/14 1944 - 98.6 F (37 C) Oral - - -  09/15/14 1502 (!) 156/61 mmHg 98.3 F (36.8 C) Oral 78 12 98 %  09/15/14 1135 (!) 159/79 mmHg 97.7 F (36.5 C) Oral 65 11 98 %   Current Weight  09/14/14 259 lb 0.7 oz (117.5 kg)     Intake/Output from previous day: 07/03 0701 - 07/04 0700 In: 700 [P.O.:600; IV Piggyback:100] Out: 1410 [Urine:1300; Chest Tube:110]    PHYSICAL EXAM:  Heart: RRR Lungs: Few coarse BS that clear with cough Chest tube: No air leak   Lab Results: CBC: Recent Labs  09/14/14 1154 09/15/14 0614  WBC 5.8 14.3*  HGB 12.8 11.8*  HCT 41.0 38.7  PLT 83* 92*   BMET:  Recent Labs  09/14/14 1154 09/15/14 0614  NA 138 137  K 3.7 3.6  CL 97* 95*  CO2 30 34*  GLUCOSE 124* 134*  BUN 16 14  CREATININE 0.67 0.63  CALCIUM 8.9 8.8*    PT/INR: No results for input(s): LABPROT, INR in the last 72 hours.  CXR:  FINDINGS: Stable right-sided basilar chest tube without definite right-sided pneumothorax. Stable surgical changes involving the right upper lobe. Resolving subcutaneous emphysema. Stable emphysematous changes and apical pulmonary scarring. No acute overlying pulmonary process.  IMPRESSION: Stable right-sided chest tube without definite pneumothorax. Resolving subcutaneous emphysema.  Chronic emphysematous changes but no acute overlying  pulmonary Process.  Assessment/Plan: S/P  R CT for spontaneous ptx-  CT with no air leak, CXR stable. Hopefully can decrease CT to water seal. Continue pulm toilet, Mucinex, will add flutter valve as she has trouble with IS. ID- Continue Tressie Ellis, will follow WBC in am.   LOS: 2 days    COLLINS,GINA H 09/16/2014  Chest tube to water seal Add prednisone 10 mg daily for COPD flare/bronchitis patient examined and medical record reviewed,agree with above note. [name] 09/16/2014

## 2014-09-17 ENCOUNTER — Inpatient Hospital Stay (HOSPITAL_COMMUNITY): Payer: Commercial Managed Care - HMO

## 2014-09-17 LAB — CBC
HCT: 36.9 % (ref 36.0–46.0)
Hemoglobin: 11.6 g/dL — ABNORMAL LOW (ref 12.0–15.0)
MCH: 26.2 pg (ref 26.0–34.0)
MCHC: 31.4 g/dL (ref 30.0–36.0)
MCV: 83.3 fL (ref 78.0–100.0)
Platelets: 104 10*3/uL — ABNORMAL LOW (ref 150–400)
RBC: 4.43 MIL/uL (ref 3.87–5.11)
RDW: 17.8 % — AB (ref 11.5–15.5)
WBC: 10.4 10*3/uL (ref 4.0–10.5)

## 2014-09-17 LAB — URINALYSIS, ROUTINE W REFLEX MICROSCOPIC
BILIRUBIN URINE: NEGATIVE
Glucose, UA: NEGATIVE mg/dL
KETONES UR: NEGATIVE mg/dL
Nitrite: NEGATIVE
Protein, ur: NEGATIVE mg/dL
Specific Gravity, Urine: 1.011 (ref 1.005–1.030)
Urobilinogen, UA: 0.2 mg/dL (ref 0.0–1.0)
pH: 7 (ref 5.0–8.0)

## 2014-09-17 LAB — BASIC METABOLIC PANEL
Anion gap: 8 (ref 5–15)
BUN: 17 mg/dL (ref 6–20)
CO2: 32 mmol/L (ref 22–32)
Calcium: 8.4 mg/dL — ABNORMAL LOW (ref 8.9–10.3)
Chloride: 97 mmol/L — ABNORMAL LOW (ref 101–111)
Creatinine, Ser: 0.54 mg/dL (ref 0.44–1.00)
Glucose, Bld: 115 mg/dL — ABNORMAL HIGH (ref 65–99)
POTASSIUM: 4.7 mmol/L (ref 3.5–5.1)
Sodium: 137 mmol/L (ref 135–145)

## 2014-09-17 LAB — URINE MICROSCOPIC-ADD ON

## 2014-09-17 NOTE — Discharge Summary (Signed)
Physician Discharge Summary       [address]       [place] [address]             [phone]    Patient ID: [name] [name] MRN: [id number] DOB/AGE: 73-May-1942 73 y.o.  Admit date: 09/14/2014 Discharge date: 09/19/2014  Admission Diagnoses: 1. Spontaneous left pneumothorax 2. History of COPD (oxygen dependent) 3. History of right VATS,bleb resection,pleurodesis 4. History of hypertension 5. History of GERD 6. History of blood dyscrasia 7. History of anemia 8. History of tobacco abuse  Discharge Diagnoses:  1. Spontaneous left pneumothorax 2. History of COPD (oxygen dependent) 3. History of right VATS,bleb resection,pleurodesis 4. History of hypertension 5. History of GERD 6. History of blood dyscrasia 7. History of anemia 8. History of tobacco abuse   Procedure (s):  Urgent placement of right 20 French chest tube for spontaneous pneumothorax by [name] on 09/14/2014.  History of Presenting Illness: This is a 74 year old patient with advanced COPD on home oxygen presents to the [facility] ED with recent symptoms of a upper respiratory infection and severe cough now with acute onset right chest pain and increasing shortness of breath. Chest x-ray shows a 30% right basilar pneumothorax. The patient had a right VATS 10 years ago for a right apical pneumothorax and was treated with bleb resection and pleurodesis of the upper pleural space. She's had no recurrent pneumothorax until today. Her oxygen saturation has dropped to 85-90% despite nasal cannula. I was called to see the patient in the emergency department and recommended placement of a urgent right chest tube. The patient has had recent severe cough of purulent material was placed on oral anabiotic's by her primary care physician. She is on multiple inhalers at home as well as Xarelto for history of blood clots. She stopped smoking less than a year ago with 50-pack-year history of cigarettes use. Dr.  Prescott Gum was consulted to place a right chest tube urgently. Follow up CXR showed resolution of right pneumothorax and right subcutaneous emphysema.   Brief Hospital Course:  She has remained afebrile and hemodynamically stable. Daily chest x rays were obtained and remained stable. There was no air leak. Chest tube was placed to water seal on 09/16/2014. It was then removed on 09/17/2014. She developed a productive cough. She was placed on Mucinex. She was also started on Fortaz as her WBC was elevated and it was thought she may have bronchitis. Last WBC was down to 10,400. She is on 3 liters of oxygen via [place] and was on oxygen prior to admission. She is felt surgically stable for discharge today.   Latest Vital Signs: Blood pressure 126/49, pulse 68, temperature 97.6 F (36.4 C), temperature source Oral, resp. rate 15, height 5\' 5"  (1.651 m), weight 259 lb 0.7 oz (117.5 kg), SpO2 96 %.  Physical Exam: Cardiovascular: RRR Pulmonary: Some coarse breath sounds R>L Abdomen: Soft, non tender, bowel sounds present. Extremities: No lower extremity edema.   Discharge Condition:Stable   Recent laboratory studies:  Lab Results  Component Value Date   WBC 10.4 09/17/2014   HGB 11.6* 09/17/2014   HCT 36.9 09/17/2014   MCV 83.3 09/17/2014   PLT 104* 09/17/2014   Lab Results  Component Value Date   NA 137 09/17/2014   K 4.7 09/17/2014   CL 97* 09/17/2014   CO2 32 09/17/2014   CREATININE 0.54 09/17/2014   GLUCOSE 115* 09/17/2014    Diagnostic Studies:  EXAM: CHEST  2 VIEW  COMPARISON: 09/18/2014  FINDINGS: Subcutaneous emphysema again seen in the right chest wall, however no pneumothorax visualized. Pulmonary emphysema again demonstrated. The surgical staples again seen in the right lung apex. No evidence of acute infiltrate or edema. Heart size is within normal limits.  IMPRESSION: Pulmonary emphysema. No pneumothorax identified.   Electronically Signed  By: Earle Gell  M.D.  On: 09/19/2014 08:38   Discharge Medications:   Medication List    STOP taking these medications        amoxicillin 500 MG capsule  Commonly known as:  AMOXIL      TAKE these medications        VENTOLIN HFA 108 (90 BASE) MCG/ACT inhaler  Generic drug:  albuterol  Inhale 2 puffs into the lungs 4 (four) times daily as needed for wheezing or shortness of breath.     albuterol (2.5 MG/3ML) 0.083% nebulizer solution  Commonly known as:  PROVENTIL  Take 2.5 mg by nebulization every 6 (six) hours as needed for wheezing or shortness of breath.     atorvastatin 40 MG tablet  Commonly known as:  LIPITOR  Take 40 mg by mouth every morning.     budesonide-formoterol 160-4.5 MCG/ACT inhaler  Commonly known as:  SYMBICORT  Inhale 2 puffs into the lungs 2 (two) times daily.     digoxin 0.25 MG tablet  Commonly known as:  LANOXIN  Take 0.125 mg by mouth every morning.     diltiazem 240 MG 24 hr capsule  Commonly known as:  CARDIZEM CD  Take 240 mg by mouth every morning.     docusate sodium 100 MG capsule  Commonly known as:  COLACE  Take 100 mg by mouth daily as needed (constipation).     Fish Oil 1200 MG Caps  Take 1,200 mg by mouth every morning.     fluticasone 50 MCG/ACT nasal spray  Commonly known as:  FLONASE  Place 1 spray into both nostrils daily as needed for allergies or rhinitis.     furosemide 20 MG tablet  Commonly known as:  LASIX  Take 1 tablet (20 mg total) by mouth daily.     Iron 325 (65 FE) MG Tabs  Take 325 mg by mouth daily.     iron polysaccharides 150 MG capsule  Commonly known as:  NIFEREX  Take 1 capsule (150 mg total) by mouth daily.     levofloxacin 500 MG tablet  Commonly known as:  LEVAQUIN  Take 1 tablet (500 mg total) by mouth daily. For 5 days then stop.     metoprolol succinate 50 MG 24 hr tablet  Commonly known as:  TOPROL-XL  Take 12.5 mg by mouth daily. Take with or immediately following a meal.     oxybutynin 5 MG 24  hr tablet  Commonly known as:  DITROPAN-XL  Take 10 mg by mouth at bedtime.     pantoprazole 40 MG tablet  Commonly known as:  PROTONIX  Take 1 tablet (40 mg total) by mouth daily.     rivaroxaban 20 MG Tabs tablet  Commonly known as:  XARELTO  Take 20 mg by mouth daily with supper.     tiotropium 18 MCG inhalation capsule  Commonly known as:  SPIRIVA  Place 1 capsule (18 mcg total) into inhaler and inhale daily.     traMADol 50 MG tablet  Commonly known as:  ULTRAM  Take 1 tablet (50 mg total) by mouth every 6 (six) hours as needed  for moderate pain (pain).     vitamin B-12 500 MCG tablet  Commonly known as:  CYANOCOBALAMIN  Take 1,000 mcg by mouth daily.        Follow Up Appointments: Follow-up Information    Follow up with Ivin Poot III, MD On 09/26/2014.   Specialty:  Cardiothoracic Surgery   Why:  PA/LAT CXR to be taken (at [facility] which is in the same building as Dr. Lucianne Lei Trigt's office) on 09/26/2014 at 2:15 pm ;Appointment time is at 3:00 pm   Contact information:   [place] [address] [phone]       Signed: Lars Pinks MPA-C 09/19/2014, 8:45 AM

## 2014-09-17 NOTE — Consult Note (Signed)
   [facility] CM Inpatient Consult   09/17/2014  ARMINDA FOGLIO Jul 12, 1940 [phone] Patient is currently active [prior to [place] with [facility] for chronic disease management services.  Patient has been engaged by a [employer].  Our community based plan of care has focused on disease management and community resource support.  Patient will receive a post discharge transition of care call and will be evaluated for monthly home visits for assessments and disease process education.  Will follow up Inpatient Case Manager aware that [facility] following. Of note, [facility] Care Management services does not replace or interfere with any services that are arranged by inpatient case management or social work.  For additional questions or referrals please contact: Natividad Brood, RN BSN [facility] Liaison  [phone] business mobile phone

## 2014-09-17 NOTE — Progress Notes (Addendum)
      [address]       [place] [address]             [phone]            Subjective: Patient eating breakfast. States cough is slowly getting better.  Objective: Vital signs in last 24 hours: Temp:  [97.7 F (36.5 C)-98.5 F (36.9 C)] 97.7 F (36.5 C) (07/05 0735) Pulse Rate:  [60-70] 61 (07/05 0415) Cardiac Rhythm:  [-] Normal sinus rhythm (07/04 1955) Resp:  [11-19] 11 (07/05 0415) BP: (130-159)/(47-115) 150/47 mmHg (07/05 0415) SpO2:  [94 %-99 %] 94 % (07/05 0415)     Intake/Output from previous day: 07/04 0701 - 07/05 0700 In: 800 [P.O.:600; IV Piggyback:200] Out: 2215 [Urine:2175; Chest Tube:40]   Physical Exam:  Cardiovascular: RRR Pulmonary: Some coarse breath sounds R>L Abdomen: Soft, non tender, bowel sounds present. Extremities: No  lower extremity edema. Chest Tube: to water seal, no air leak  Lab Results: CBC:  Recent Labs  09/15/14 0614 09/17/14 0255  WBC 14.3* 10.4  HGB 11.8* 11.6*  HCT 38.7 36.9  PLT 92* 104*   BMET:   Recent Labs  09/15/14 0614 09/17/14 0255  NA 137 137  K 3.6 4.7  CL 95* 97*  CO2 34* 32  GLUCOSE 134* 115*  BUN 14 17  CREATININE 0.63 0.54  CALCIUM 8.8* 8.4*    PT/INR: No results for input(s): LABPROT, INR in the last 72 hours. ABG:  INR: Will add last result for INR, ABG once components are confirmed Will add last 4 CBG results once components are confirmed  Assessment/Plan:  1. CV - SR in the 60's. 2.  Pulmonary - Chest tube is to water seal. There is no air leak. CXR shows subcutaneous emphysema right lateral chest wall, no pneumothorax, and severe emphysema. Hope to remove chest tube. Continue Mucinex for cough and nebs 3.ID-On Tressie Ellis. WBC decreased to 10,400.  ZIMMERMAN,DONIELLE MPA-C 09/17/2014,8:24 AM  DC chest tube am wed 7-6 if no air leak with cough , no PNTX on CXR

## 2014-09-18 ENCOUNTER — Inpatient Hospital Stay (HOSPITAL_COMMUNITY): Payer: Commercial Managed Care - HMO

## 2014-09-18 ENCOUNTER — Ambulatory Visit: Payer: Commercial Managed Care - HMO | Admitting: *Deleted

## 2014-09-18 NOTE — Clinical Documentation Improvement (Signed)
Noted home medication, being continued during hospitalization: Lasix 20mg  po qd.  Is it possible to specify what condition is being treated with the use of Lasix, for example:  Chronic systolic CHF Chonic diastolic CHF Chronic combined systolic and diastolic CHF Unable to suspect or determine Other, please specify:  Thank You,  Carrolyn Meiers, [email or web address] [phone]

## 2014-09-18 NOTE — Progress Notes (Addendum)
      [address]       [place] [address]             [phone]      Subjective:  [name] [name] states she is hopeful her chest tube comes out.  She states it makes her hurt.  Objective: Vital signs in last 24 hours: Temp:  [97 F (36.1 C)-98.2 F (36.8 C)] 97.4 F (36.3 C) (07/06 0803) Pulse Rate:  [61-110] 61 (07/06 0355) Cardiac Rhythm:  [-] Normal sinus rhythm (07/06 0355) Resp:  [12-26] 14 (07/06 0355) BP: (136-165)/(48-58) 147/56 mmHg (07/06 0355) SpO2:  [92 %-100 %] 97 % (07/06 0740)  Intake/Output from previous day: 07/05 0701 - 07/06 0700 In: 390 [P.O.:240; IV Piggyback:150] Out: 3800 [Urine:3800]  General appearance: alert, cooperative and no distress Heart: regular rate and rhythm Lungs: rhonchi on the right Abdomen: soft, non-tender; bowel sounds normal; no masses,  no organomegaly Wound: clean and dry  Lab Results:  Recent Labs  09/17/14 0255  WBC 10.4  HGB 11.6*  HCT 36.9  PLT 104*   BMET:  Recent Labs  09/17/14 0255  NA 137  K 4.7  CL 97*  CO2 32  GLUCOSE 115*  BUN 17  CREATININE 0.54  CALCIUM 8.4*    PT/INR: No results for input(s): LABPROT, INR in the last 72 hours. ABG    Component Value Date/Time   TCO2 39 12/28/2013 1041   CBG (last 3)  No results for input(s): GLUCAP in the last 72 hours.  Assessment/Plan:  1. Chest tube- no air leak present, CXR free from pneumothorax will d/c chest tube today 2. Pulm- wean oxygen as tolerated 3. CV- mild hypertension, on home regimen 4. Dispo- patient stable, d/c chest tube today, CXR in AM   LOS: 4 days    [name], [name] 09/18/2014  DC chest tube Home in am if CXR ok  PVT

## 2014-09-18 NOTE — Discharge Instructions (Signed)
Pneumothorax °A pneumothorax, commonly called a collapsed lung, is a condition in which air leaks from a lung and builds up in the space between the lung and the chest wall (pleural space). The air in a pneumothorax is trapped outside the lung and takes up space, preventing the lung from fully expanding. This is a condition that usually occurs suddenly. The buildup of air may be small or large. A small pneumothorax may go away on its own. When a pneumothorax is larger, it will often require medical treatment and hospitalization.  °CAUSES  °A pneumothorax can sometimes happen quickly with no apparent cause. People with underlying lung problems, particularly COPD or emphysema, are at higher risk of pneumothorax. However, pneumothorax can happen quickly even in people with no prior known lung problems. Trauma, surgery, medical procedures, or injury to the chest wall can also cause a pneumothorax. °SIGNS AND SYMPTOMS  °Sometimes a pneumothorax will have no symptoms. When symptoms are present, they can include: °· Chest pain. °· Shortness of breath. °· Increased rate of breathing. °· Bluish color to your lips or skin (cyanosis). °DIAGNOSIS  °Pneumothorax is usually diagnosed by a chest X-ray or chest CT scan. Your health care provider will also take a medical history and perform a physical exam to determine why you may have a pneumothorax. °TREATMENT  °A small pneumothorax may go away on its own without treatment. Extra oxygen can sometimes help a small pneumothorax go away more quickly. For a larger pneumothorax or a pneumothorax that is causing symptoms, a procedure is usually needed to drain the air. In some cases, the health care provider may drain the air using a needle. In other cases, a chest tube may be inserted into the pleural space. A chest tube is a small tube placed between the ribs and into the pleural space. This removes the extra air and allows the lung to expand back to its normal size. A large  pneumothorax will usually require a hospital stay. If there is ongoing air leakage into the pleural space, then the chest tube may need to remain in place for several days until the air leak has healed. In some cases, surgery may be needed.  °HOME CARE INSTRUCTIONS  °· Only take over-the-counter or prescription medicines as directed by your health care provider. °· If a cough or pain makes it difficult for you to sleep at night, try sleeping in a semi-upright position in a recliner or by using 2 or 3 pillows. °· Rest and limit activity as directed by your health care provider. °· If you had a chest tube and it was removed, ask your health care provider when it is okay to remove the dressing. Until your health care provider says you can remove the dressing, do not allow it to get wet. °· Do not smoke. Smoking is a risk factor for pneumothorax. °· Do not fly in an airplane or scuba dive until your health care provider says it is okay. °· Follow up with your health care provider as directed. °SEEK IMMEDIATE MEDICAL CARE IF:  °· You have increasing chest pain or shortness of breath. °· You have a cough that is not controlled with suppressants. °· You begin coughing up blood. °· You have pain that is getting worse or is not controlled with medicines. °· You cough up thick, discolored mucus (sputum) that is yellow to green in color. °· You have redness, increasing pain, or discharge at the site where a chest tube had been in place (if   your pneumothorax was treated with a chest tube).  The site where your chest tube was located opens up.  You feel air coming out of the site where the chest tube was placed.  You have a fever or persistent symptoms for more than 2-3 days.  You have a fever and your symptoms suddenly get worse. MAKE SURE YOU:   Understand these instructions.  Will watch your condition.  Will get help right away if you are not doing well or get worse. Document Released: 03/01/2005 Document  Revised: 12/20/2012 Document Reviewed: 09/28/2012 [facility] Patient Information 2015 [place], [place]. This information is not intended to replace advice given to you by your health care provider. Make sure you discuss any questions you have with your health care provider.   >>>>>>>>>>>>>>>>>>   Information on my medicine - XARELTO (rivaroxaban)  This medication education was reviewed with me or my healthcare representative as part of my discharge preparation.  The pharmacist that spoke with me during my hospital stay was:  Blossom Hoops, [place]? Xarelto was prescribed to treat blood clots that may have been found in the veins of your legs (deep vein thrombosis) or in your lungs (pulmonary embolism) and to reduce the risk of them occurring again.  What do you need to know about Xarelto? DO NOT stop taking Xarelto without talking to the health care provider who prescribed the medication.  Refill your prescription for 20 mg tablets before you run out.  After discharge, you should have regular check-up appointments with your healthcare provider that is prescribing your Xarelto.  In the future your dose may need to be changed if your kidney function changes by a significant amount.  What do you do if you miss a dose? If you are taking Xarelto ONCE DAILY and you miss a dose, take it as soon as you remember on the same day then continue your regularly scheduled once daily regimen the next day. Do not take two doses of Xarelto at the same time.   Important Safety Information Xarelto is a blood thinner medicine that can cause bleeding. You should call your healthcare provider right away if you experience any of the following: ? Bleeding from an injury or your nose that does not stop. ? Unusual colored urine (red or dark brown) or unusual colored stools (red or black). ? Unusual bruising for unknown reasons. ? A serious fall or if you hit your head (even if  there is no bleeding).  Some medicines may interact with Xarelto and might increase your risk of bleeding while on Xarelto. To help avoid this, consult your healthcare provider or pharmacist prior to using any new prescription or non-prescription medications, including herbals, vitamins, non-steroidal anti-inflammatory drugs (NSAIDs) and supplements.  This website has more information on Xarelto: [email or web address]

## 2014-09-19 ENCOUNTER — Inpatient Hospital Stay (HOSPITAL_COMMUNITY): Payer: Commercial Managed Care - HMO

## 2014-09-19 MED ORDER — LEVOFLOXACIN 500 MG PO TABS: 500.0000 mg | ORAL_TABLET | Freq: Every day | ORAL | Status: DC

## 2014-09-19 MED ORDER — TRAMADOL HCL 50 MG PO TABS: 50.0000 mg | ORAL_TABLET | Freq: Four times a day (QID) | ORAL | Status: DC | PRN

## 2014-09-19 NOTE — Progress Notes (Signed)
Pt to d/c this pm. Reviewed instructions with Pt and family. They indicated understanding.

## 2014-09-19 NOTE — Progress Notes (Signed)
      [address]       [name] [address]             [phone]            Subjective: Patient just returned from CXR. She is hungry.  Objective: Vital signs in last 24 hours: Temp:  [97.3 F (36.3 C)-98 F (36.7 C)] 97.6 F (36.4 C) (07/07 0740) Pulse Rate:  [65-73] 68 (07/07 0400) Cardiac Rhythm:  [-] Normal sinus rhythm (07/06 2030) Resp:  [13-15] 15 (07/07 0400) BP: (124-153)/(42-113) 126/49 mmHg (07/07 0400) SpO2:  [96 %-100 %] 96 % (07/07 0730)     Intake/Output from previous day: 07/06 0701 - 07/07 0700 In: -  Out: 2400 [Urine:2400]   Physical Exam:  Cardiovascular: RRR Pulmonary: Some coarse breath sounds R>L Abdomen: Soft, non tender, bowel sounds present. Extremities: No  lower extremity edema. Chest Tube: to water seal, no air leak  Lab Results: CBC:  Recent Labs  09/17/14 0255  WBC 10.4  HGB 11.6*  HCT 36.9  PLT 104*   BMET:   Recent Labs  09/17/14 0255  NA 137  K 4.7  CL 97*  CO2 32  GLUCOSE 115*  BUN 17  CREATININE 0.54  CALCIUM 8.4*    PT/INR: No results for input(s): LABPROT, INR in the last 72 hours. ABG:  INR: Will add last result for INR, ABG once components are confirmed Will add last 4 CBG results once components are confirmed  Assessment/Plan:  1. CV - SR in the 90's. On Lopressor 25 mg bid. 2.  Pulmonary - Chest tube was removed yesterday.CXR this am appears to show no pneumothorax, subcutaneous emphysema right lateral chest wall. Encourage incentive spirometer and flutter valve. 3.ID-On Tressie Ellis. WBC decreased to 10,400. Per [name], will continue Levaquin for 5 days after discharge. 4. Likely discharge today-await radiologist interpretation of CXR  ZIMMERMAN,DONIELLE MPA-C 09/19/2014,8:13 AM

## 2014-09-19 NOTE — Consult Note (Signed)
   [facility]   09/19/2014  [name] [name] 1940-07-11 [id number] Met with the patient at bedside.  Patient states she has ongoing needs for [facility] Management for disease and care management services.  Patient states she would like to have a Education officer, museum from [facility] to follow up with some housing resources in [place].  She states that her brother and son both live in [place] area and her physician is in [place] and she feels she would have better support and follow up.  This Probation officer will follow up with [facility] team.  Patient is hopeful to discharge soon.  Of note, [facility] Care Management does not replace or interfere with any services arranged by inpatient care management team or staff.  For questions, please contact: Natividad Brood, RN BSN [facility] Liaison  [phone] business mobile phone

## 2014-09-20 ENCOUNTER — Other Ambulatory Visit: Payer: Self-pay | Admitting: *Deleted

## 2014-09-20 NOTE — Care Management (Signed)
Important Message  Patient Details  Name: [name] [name] MRN: [id number] Date of Birth: 05/08/1940   Medicare Important Message Given:  Yes-third notification given    Nathen May 09/20/2014, 10:26 AM

## 2014-09-20 NOTE — Patient Outreach (Signed)
[facility]) Care Management  09/20/2014  [name] [name] 03-29-1940 [id number]   Initial Transition of Care telephone outreach  Subjective: Spoke with [name] [name], reports she is glad to be at home, states " my lung just collapsed", she was discharged home on 7/7 after a being hospitalized for a left pneumothorax. Noted during conversation that she still has a coarse sounding cough, but denies shortness of breath or increased pain, has home oxygen on at her usual 2 liters.  Emphasized the importance of follow up with Dr.Vantrigt on July 14, with a chest xray, then appointment with MD. [name] [name] states that her son is arranging for someone from his church to provide transportation to the appointment, she declines use of the [employer] transportation service due to the length of time it takes "they don't wait for you, they just leave and you are waiting on them to return", from this past experience her son [name] [name] has agreed to arrange her transportation for out of [place] appointments. Her son will visit her this weekend.  Reviewed with [name] [name] importance of taking medications as prescribed, she questioned whether to take the antibiotic, amoxil  that was prescribed to her by Dr.Hamrick prior to her admission to hospital, after reviewing her discharge instructions in EPIC- which reads to stop amoxil and take the Levaquin as ordered, I explained this to her, even though she is  hard of hearing  she verbalized understanding of taking Levaquin as prescribed reported to me  she started taking it  this morning and has already placed the remaining doses in her daily  pill box and she reports that she has not taken any of the amoxil since being home and will remove the pill bottle from her bag.  I encouraged her to continue to use her incentive spirometry, which she states she bought home from the hospital.  Plan: Will continue transition of care program with a call on next  week. Reviewed contact information and instructed to call for concerns, call 911 for emergencies .  Joylene Draft, RN, [place] Care Management [phone]

## 2014-09-24 ENCOUNTER — Inpatient Hospital Stay: Admission: RE | Admit: 2014-09-24 | Payer: Commercial Managed Care - HMO | Source: Ambulatory Visit

## 2014-09-24 ENCOUNTER — Ambulatory Visit: Payer: Commercial Managed Care - HMO

## 2014-09-26 ENCOUNTER — Other Ambulatory Visit: Payer: Self-pay

## 2014-09-26 ENCOUNTER — Ambulatory Visit: Payer: Commercial Managed Care - HMO | Admitting: Cardiothoracic Surgery

## 2014-09-26 DIAGNOSIS — J449 Chronic obstructive pulmonary disease, unspecified: Secondary | ICD-10-CM | POA: Diagnosis not present

## 2014-09-26 NOTE — Patient Outreach (Signed)
Transition of care call:  Placed call to patient. She answered but is having difficulty hearing on the phone. Patient reports that she thinks her breathing is getting better. States that she is taking her medications as prescribed.  Discussed pending follow up MD appointment today. Patient reports that she rescheduled appointment to Monday July 18th when her daughter in law can take her to her appointment.  Patient denies any problems or concerns today.   Plan: Patient will continue to get weekly transition of care calls by RN Case Manager.  Tomasa Rand, RN, BSN, CEN [facility] [employer] [phone]

## 2014-09-27 ENCOUNTER — Other Ambulatory Visit: Payer: Self-pay | Admitting: Cardiothoracic Surgery

## 2014-09-27 DIAGNOSIS — J939 Pneumothorax, unspecified: Secondary | ICD-10-CM

## 2014-09-30 ENCOUNTER — Ambulatory Visit (INDEPENDENT_AMBULATORY_CARE_PROVIDER_SITE_OTHER): Payer: Commercial Managed Care - HMO | Admitting: Physician Assistant

## 2014-09-30 ENCOUNTER — Ambulatory Visit
Admission: RE | Admit: 2014-09-30 | Discharge: 2014-09-30 | Disposition: A | Discharge status: Home / Self Care | Payer: Commercial Managed Care - HMO | Source: Ambulatory Visit | Attending: Cardiothoracic Surgery | Admitting: Cardiothoracic Surgery

## 2014-09-30 VITALS — BP 127/63 | HR 62 | Resp 20 | Ht 65.0 in | Wt 160.0 lb

## 2014-09-30 DIAGNOSIS — J9383 Other pneumothorax: Secondary | ICD-10-CM

## 2014-09-30 DIAGNOSIS — R0602 Shortness of breath: Secondary | ICD-10-CM | POA: Diagnosis not present

## 2014-09-30 DIAGNOSIS — J939 Pneumothorax, unspecified: Secondary | ICD-10-CM

## 2014-09-30 NOTE — Progress Notes (Signed)
HPI:  Patient returns for routine postoperative follow-up having undergone a right chest tube for spontaneous pneumothorax. She previously had a right VATS for bleb resection and pleurodesis about 10 years ago. Since hospital discharge, the patient reports she has no complaints. She has been oxygen dependent (emphysema) prior to chest tube placement and is increasing her activity as tolerates.   Current Outpatient Prescriptions  Medication Sig Dispense Refill  . albuterol (PROVENTIL) (2.5 MG/3ML) 0.083% nebulizer solution Take 2.5 mg by nebulization every 6 (six) hours as needed for wheezing or shortness of breath.     Marland Kitchen atorvastatin (LIPITOR) 40 MG tablet Take 40 mg by mouth every morning.     . budesonide-formoterol (SYMBICORT) 160-4.5 MCG/ACT inhaler Inhale 2 puffs into the lungs 2 (two) times daily. 1 Inhaler 12  . digoxin (LANOXIN) 0.25 MG tablet Take 0.125 mg by mouth every morning.     . diltiazem (CARDIZEM CD) 240 MG 24 hr capsule Take 240 mg by mouth every morning.   1  . docusate sodium (COLACE) 100 MG capsule Take 100 mg by mouth daily as needed (constipation).     . Ferrous Sulfate (IRON) 325 (65 FE) MG TABS Take 325 mg by mouth daily.    . fluticasone (FLONASE) 50 MCG/ACT nasal spray Place 1 spray into both nostrils daily as needed for allergies or rhinitis.    . furosemide (LASIX) 20 MG tablet Take 1 tablet (20 mg total) by mouth daily. 30 tablet 1  . iron polysaccharides (NIFEREX) 150 MG capsule Take 1 capsule (150 mg total) by mouth daily. 60 capsule 1  . levofloxacin (LEVAQUIN) 500 MG tablet Take 1 tablet (500 mg total) by mouth daily. For 5 days then stop. 5 tablet 0  . metoprolol succinate (TOPROL-XL) 50 MG 24 hr tablet Take 12.5 mg by mouth daily. Take with or immediately following a meal.    . Omega-3 Fatty Acids (FISH OIL) 1200 MG CAPS Take 1,200 mg by mouth every morning.     Marland Kitchen oxybutynin (DITROPAN-XL) 5 MG 24 hr tablet Take 10 mg by mouth at bedtime.    . pantoprazole  (PROTONIX) 40 MG tablet Take 1 tablet (40 mg total) by mouth daily. 30 tablet 0  . rivaroxaban (XARELTO) 20 MG TABS tablet Take 20 mg by mouth daily with supper.    . tiotropium (SPIRIVA) 18 MCG inhalation capsule Place 1 capsule (18 mcg total) into inhaler and inhale daily. 30 capsule 12  . traMADol (ULTRAM) 50 MG tablet Take 1 tablet (50 mg total) by mouth every 6 (six) hours as needed for moderate pain (pain). 30 tablet 0  . VENTOLIN HFA 108 (90 BASE) MCG/ACT inhaler Inhale 2 puffs into the lungs 4 (four) times daily as needed for wheezing or shortness of breath.     . vitamin B-12 (CYANOCOBALAMIN) 500 MCG tablet Take 1,000 mcg by mouth daily.     Vital Signs: BP 127/63, HR 62, RR 20, Oxygen saturation 97% on room air  Physical Exam: CV-RRR Pulmonary-Diminished throughout Extremities-No cyanosis, clubbing, edema Wound-Clean and dry. Ecchymosis right chest wall  Diagnostic Tests: EXAM: CHEST 2 VIEW  COMPARISON: PA and lateral chest x-ray of September 19, 2014  FINDINGS: The lungs remain hyperinflated. The interstitial markings remain coarse in the right perihilar region and in the apex. There is no pneumothorax. The subcutaneous emphysema on the right has resolved. There is no pleural effusion. The left lung is clear. The heart is normal in size. There is stable levocurvature centered in the  mid thoracic spine. The bony thorax exhibits no acute abnormality. There is chronic deformity of the proximal right humerus.  IMPRESSION: Stable emphysematous changes bilaterally. There is no recurrent pneumothorax.   Electronically Signed  By: David Martinique M.D.  On: 09/30/2014 12:48  Impression and Plan: Overall, patient continues to recover well. I removed 2 silk sutures. There was a small scab. No signs of infection. She will return to see [name] in 4 weeks with a chest x ray.     Nani Skillern, PA-C [facility] [phone]

## 2014-10-01 NOTE — Patient Outreach (Signed)
Documentation of resolving pharmacy episode and involvement with Ms. Condie.  I will be available as needed for future pharmacy issues.   Deanne Coffer, PharmD, [place] [phone]

## 2014-10-02 ENCOUNTER — Encounter: Payer: Self-pay | Admitting: *Deleted

## 2014-10-02 ENCOUNTER — Other Ambulatory Visit: Payer: Self-pay | Admitting: *Deleted

## 2014-10-02 NOTE — Patient Outreach (Signed)
[place] [facility]) Care Management  10/02/2014  [name] [name] 1940-04-13 [id number]   Transition of Care Call Week #3  Spoke with [name] by telephone, reports she is doing just fine. She states she had her follow up appointment with Dr. Darcey Nora and everything looked good.[name] reports that she is taking her medication as prescribed.  Denies complaints of cough, shortness of breath or pain today and  no further concerns voiced.  Plan:  Home visit on July 25 at 1000, will review medication list at that time due to patient with some increased hearing difficulty while on the  telephone, improved communication when face to face with her.  Joylene Draft, RN, [place] Care Management [phone]

## 2014-10-07 ENCOUNTER — Other Ambulatory Visit: Payer: Self-pay | Admitting: *Deleted

## 2014-10-07 ENCOUNTER — Encounter: Payer: Self-pay | Admitting: *Deleted

## 2014-10-07 NOTE — Patient Outreach (Signed)
[facility] [facility]) Care Management   10/07/2014  [name] [name] 12/02/1940 [id number]  [name] [name] is an 74 y.o. female  Transition of Care Home Visit  Subjective: [name] [name] reports that she is doing much better. Discussed her recent post hospital visit at [place] office.[name] [name] states "my biggest concern is getting someone in here to help me with my housework,vacuuming,sweeping, my son and daughter in law only get to come about once a week, I have medicaid'.  [name] [name] denies shortness breath, increased cough or pain, reports that she is still managing her medication without problems,although  during medication review she reports that she does not take her spriva or Symbicort on a daily basis and does not use the    Objective:   Review of Systems  Constitutional: Negative.   HENT: Negative.   Respiratory: Negative.   Cardiovascular: Negative.   Gastrointestinal: Negative.   Genitourinary: Negative.   Musculoskeletal: Negative.   Skin: Negative.   Neurological: Negative.   Psychiatric/Behavioral: Negative.     Physical Exam  Constitutional: She is oriented to person, place, and time. She appears well-developed.  Eyes: Conjunctivae are normal.  Cardiovascular: Normal rate and regular rhythm.   Respiratory: Effort normal.  Decreased at lower lobes, wearing Oxygen at 2 liters nasal cannula.  Musculoskeletal: She exhibits no edema.  Neurological: She is oriented to person, place, and time.  Skin: Skin is warm and dry.  Psychiatric: She has a normal mood and affect.   BP 120/70 mmHg  Pulse 63  Resp 20  SpO2 93% Current Medications:   Current Outpatient Prescriptions  Medication Sig Dispense Refill  . albuterol (PROVENTIL) (2.5 MG/3ML) 0.083% nebulizer solution Take 2.5 mg by nebulization every 6 (six) hours as needed for wheezing or shortness of breath.     Marland Kitchen atorvastatin (LIPITOR) 40 MG tablet Take 40 mg by mouth every morning.     .  budesonide-formoterol (SYMBICORT) 160-4.5 MCG/ACT inhaler Inhale 2 puffs into the lungs 2 (two) times daily. 1 Inhaler 12  . digoxin (LANOXIN) 0.25 MG tablet Take 0.125 mg by mouth every morning.     . diltiazem (CARDIZEM CD) 240 MG 24 hr capsule Take 240 mg by mouth every morning.   1  . docusate sodium (COLACE) 100 MG capsule Take 100 mg by mouth daily as needed (constipation).     . Ferrous Sulfate (IRON) 325 (65 FE) MG TABS Take 325 mg by mouth daily.    . fluticasone (FLONASE) 50 MCG/ACT nasal spray Place 1 spray into both nostrils daily as needed for allergies or rhinitis.    . furosemide (LASIX) 20 MG tablet Take 1 tablet (20 mg total) by mouth daily. 30 tablet 1  . metoprolol succinate (TOPROL-XL) 50 MG 24 hr tablet Take 12.5 mg by mouth daily. Take with or immediately following a meal.    . Omega-3 Fatty Acids (FISH OIL) 1200 MG CAPS Take 1,200 mg by mouth every morning.     Marland Kitchen oxybutynin (DITROPAN-XL) 5 MG 24 hr tablet Take 10 mg by mouth at bedtime.    . pantoprazole (PROTONIX) 40 MG tablet Take 1 tablet (40 mg total) by mouth daily. 30 tablet 0  . rivaroxaban (XARELTO) 20 MG TABS tablet Take 20 mg by mouth daily with supper.    . tiotropium (SPIRIVA) 18 MCG inhalation capsule Place 1 capsule (18 mcg total) into inhaler and inhale daily. 30 capsule 12  . traMADol (ULTRAM) 50 MG tablet Take 1 tablet (50 mg total) by  mouth every 6 (six) hours as needed for moderate pain (pain). 30 tablet 0  . VENTOLIN HFA 108 (90 BASE) MCG/ACT inhaler Inhale 2 puffs into the lungs 4 (four) times daily as needed for wheezing or shortness of breath.     . vitamin B-12 (CYANOCOBALAMIN) 500 MCG tablet Take 1,000 mcg by mouth daily.     . iron polysaccharides (NIFEREX) 150 MG capsule Take 1 capsule (150 mg total) by mouth daily. (Patient not taking: Reported on 10/07/2014) 60 capsule 1  . levofloxacin (LEVAQUIN) 500 MG tablet Take 1 tablet (500 mg total) by mouth daily. For 5 days then stop. (Patient not taking:  Reported on 10/07/2014) 5 tablet 0   No current facility-administered medications for this visit.    Functional Status:   In your present state of health, do you have any difficulty performing the following activities: 10/07/2014 09/20/2014  Hearing? [place]? Y -  Difficulty concentrating or making decisions? N -  Walking or climbing stairs? Y -  Dressing or bathing? Y -  Doing errands, shopping? Y -  Conservation officer, nature and eating ? N N  Using the Toilet? N N  In the past six months, have you accidently leaked urine? Y Y  Do you have problems with loss of bowel control? N N  Managing your Medications? N N  Managing your Finances? N N  Housekeeping or managing your Housekeeping? N Y    Fall/Depression Screening:    PHQ 2/9 Scores 10/07/2014 10/02/2014 09/20/2014 06/20/2014  PHQ - 2 Score 0 0 0 0    Assessment:  [name] [name] ambulating in her home using a rollator, without difficulty, no falls, reports that she tris to walk outside along sidewalk at least 5 days a week early morning or late evening and always has her inhaler with her. [name] [name] son [name] [name]) and daughter in law came in during the home visit, he had questions regarding 3 large empty tanks of oxygen that she has in her bedroom that she brought home each time she was in the hospital, due to the patient not having her home oxygen tank with her at the time of discharge [facility]  provided a tank at discharge. [name] [name] also had questions regarding how to fill her small travel oxygen tank, I called [name] and they walked [place] through on the phone  how to fill tanks, and explained to son where to return large empty tanks to for refill.  Discussed with [name] [name], [name] [name] next follow up appointment with Dr.Vantrigt is on August 24, he plans to use the [facility] program in [place]  to provide transportation to and from appointment because they wait there with the patient, [name] [name] is very pleased with this, and understands it will cost  her $15.00  [name] [name] primary concern today is getting assistance with help with cleaning in her home, and assistance with taking a shower, states she just takes a "wash up", she does not take a shower due to fear of falling.  Plan: Will request an updated medication list from Dr. Onnie Boer office. Will send today's visit note and involvement letter to Dr. Lisbeth Ply Will research available resources for patient assistance at through [facility] ( will involve [facility] social worker if necessary ) and will discuss with the patient at the next home visit on 8/9 at 10:00 [place] Problem One        Patient Outreach from 10/07/2014 in [place] Problem  One  Adherence to COPD action plan   Care Plan for Problem One  Not Active   [name] Long Term Goal (31-90 days)  Patient will be able to follow the COPD action plan and notify MD for symptoms in the yellow Zones in the next 31 days   [name] Long Term Goal Start Date  05/24/14   [facility] Long Term Goal Met Date  07/30/14   Interventions for Problem One Long Term Goal  Discussed with the  the importance of notifying MD for symptoms in yellow zone before condition worsens,     [name] CM Short Term Goal #1 (0-30 days)  Patient will be able to state the symptoms  in the yellow zone that she should call the MD about in the next 30 days   [name] CM Short Term Goal #1 Start Date  05/24/14   [facility] CM Short Term Goal #1 Met Date  06/20/14   [name] CM Short Term Goal #2 (0-30 days)  patient will review the COPD material within the next 30 days    [name] CM Short Term Goal #2 Start Date  05/24/14   [facility] CM Short Term Goal #2 Met Date  06/20/14    [facility] CM Care Plan Problem Two        Patient Outreach from 10/07/2014 in [place] Problem Two  [place] for Problem Two  Not Active   [name] Long Term Goal (31-90) days  Patient will not experience a fall in the next 31 days   [name] Long Term Goal Start Date  05/24/14   [name] Long  Term Goal Met Date  06/20/14   [name] CM Short Term Goal #1 (0-30 days)  Patient will use safety equipment consistenly within the next 30 days to decrease fall   [name] CM Short Term Goal #1 Start Date  05/24/14   [facility] CM Short Term Goal #1 Met Date   06/20/14   [name] CM Short Term Goal #2 (0-30 days)  Pt will discuss  with MD on next appointment about physical therapy for strengthen   [facility] CM Short Term Goal #2 Start Date  05/24/14   [facility] CM Short Term Goal #2 Met Date  06/20/14    [facility] CM Care Plan Problem Three        Patient Outreach from 10/07/2014 in [place] Problem Three  Frequent [place] for Problem Three  Active   [name] Long Term Goal (31-90) days  Patient will not have a hospital admisssion in the next 31 days   [place] Term Goal Start Date  09/20/14   Interventions for Problem Three Long Term Goal  discussed importance of follow up keeping follow up appointments   [name] CM Short Term Goal #1 (0-30 days)  Patient will not experience  hospital admission in the next 30 days   [name] CM Short Term Goal #1 Start Date  09/20/14   Interventions for Short Term Goal #1  Discussed to notify primary care MD of any new symptoms or concerns soomer     Joylene Draft, [place], [place] Care Management [phone]

## 2014-10-09 ENCOUNTER — Inpatient Hospital Stay (HOSPITAL_COMMUNITY)
Admission: EM | Admit: 2014-10-09 | Discharge: 2014-10-19 | DRG: 201 | Disposition: A | Discharge status: Home Health | Payer: Commercial Managed Care - HMO | Attending: Cardiothoracic Surgery | Admitting: Cardiothoracic Surgery

## 2014-10-09 ENCOUNTER — Inpatient Hospital Stay (HOSPITAL_COMMUNITY): Payer: Commercial Managed Care - HMO

## 2014-10-09 ENCOUNTER — Encounter (HOSPITAL_COMMUNITY): Payer: Self-pay | Admitting: Cardiology

## 2014-10-09 ENCOUNTER — Emergency Department (HOSPITAL_COMMUNITY): Payer: Commercial Managed Care - HMO

## 2014-10-09 DIAGNOSIS — R489 Unspecified symbolic dysfunctions: Secondary | ICD-10-CM | POA: Diagnosis not present

## 2014-10-09 DIAGNOSIS — N189 Chronic kidney disease, unspecified: Secondary | ICD-10-CM | POA: Diagnosis present

## 2014-10-09 DIAGNOSIS — J939 Pneumothorax, unspecified: Principal | ICD-10-CM | POA: Diagnosis present

## 2014-10-09 DIAGNOSIS — I480 Paroxysmal atrial fibrillation: Secondary | ICD-10-CM | POA: Diagnosis not present

## 2014-10-09 DIAGNOSIS — Z9689 Presence of other specified functional implants: Secondary | ICD-10-CM | POA: Insufficient documentation

## 2014-10-09 DIAGNOSIS — J45909 Unspecified asthma, uncomplicated: Secondary | ICD-10-CM | POA: Diagnosis not present

## 2014-10-09 DIAGNOSIS — Z9981 Dependence on supplemental oxygen: Secondary | ICD-10-CM | POA: Diagnosis not present

## 2014-10-09 DIAGNOSIS — J399 Disease of upper respiratory tract, unspecified: Secondary | ICD-10-CM | POA: Diagnosis not present

## 2014-10-09 DIAGNOSIS — K219 Gastro-esophageal reflux disease without esophagitis: Secondary | ICD-10-CM | POA: Diagnosis not present

## 2014-10-09 DIAGNOSIS — Z79899 Other long term (current) drug therapy: Secondary | ICD-10-CM

## 2014-10-09 DIAGNOSIS — Z7951 Long term (current) use of inhaled steroids: Secondary | ICD-10-CM

## 2014-10-09 DIAGNOSIS — R079 Chest pain, unspecified: Secondary | ICD-10-CM | POA: Diagnosis not present

## 2014-10-09 DIAGNOSIS — J9383 Other pneumothorax: Secondary | ICD-10-CM

## 2014-10-09 DIAGNOSIS — I2510 Atherosclerotic heart disease of native coronary artery without angina pectoris: Secondary | ICD-10-CM | POA: Diagnosis present

## 2014-10-09 DIAGNOSIS — Z87891 Personal history of nicotine dependence: Secondary | ICD-10-CM | POA: Diagnosis not present

## 2014-10-09 DIAGNOSIS — Z7901 Long term (current) use of anticoagulants: Secondary | ICD-10-CM | POA: Diagnosis not present

## 2014-10-09 DIAGNOSIS — R0602 Shortness of breath: Secondary | ICD-10-CM

## 2014-10-09 DIAGNOSIS — D509 Iron deficiency anemia, unspecified: Secondary | ICD-10-CM | POA: Diagnosis not present

## 2014-10-09 DIAGNOSIS — Z4682 Encounter for fitting and adjustment of non-vascular catheter: Secondary | ICD-10-CM | POA: Diagnosis not present

## 2014-10-09 DIAGNOSIS — Z90710 Acquired absence of both cervix and uterus: Secondary | ICD-10-CM | POA: Diagnosis not present

## 2014-10-09 DIAGNOSIS — R262 Difficulty in walking, not elsewhere classified: Secondary | ICD-10-CM | POA: Diagnosis not present

## 2014-10-09 DIAGNOSIS — J984 Other disorders of lung: Secondary | ICD-10-CM | POA: Diagnosis not present

## 2014-10-09 DIAGNOSIS — Z8249 Family history of ischemic heart disease and other diseases of the circulatory system: Secondary | ICD-10-CM | POA: Diagnosis not present

## 2014-10-09 DIAGNOSIS — I129 Hypertensive chronic kidney disease with stage 1 through stage 4 chronic kidney disease, or unspecified chronic kidney disease: Secondary | ICD-10-CM | POA: Diagnosis present

## 2014-10-09 DIAGNOSIS — I10 Essential (primary) hypertension: Secondary | ICD-10-CM | POA: Diagnosis not present

## 2014-10-09 DIAGNOSIS — Z87440 Personal history of urinary (tract) infections: Secondary | ICD-10-CM

## 2014-10-09 DIAGNOSIS — J9311 Primary spontaneous pneumothorax: Secondary | ICD-10-CM | POA: Diagnosis not present

## 2014-10-09 DIAGNOSIS — J9382 Other air leak: Secondary | ICD-10-CM | POA: Diagnosis not present

## 2014-10-09 DIAGNOSIS — M6281 Muscle weakness (generalized): Secondary | ICD-10-CM | POA: Diagnosis not present

## 2014-10-09 DIAGNOSIS — R05 Cough: Secondary | ICD-10-CM | POA: Diagnosis not present

## 2014-10-09 DIAGNOSIS — J441 Chronic obstructive pulmonary disease with (acute) exacerbation: Secondary | ICD-10-CM | POA: Diagnosis not present

## 2014-10-09 DIAGNOSIS — J9811 Atelectasis: Secondary | ICD-10-CM | POA: Diagnosis not present

## 2014-10-09 DIAGNOSIS — J449 Chronic obstructive pulmonary disease, unspecified: Secondary | ICD-10-CM

## 2014-10-09 DIAGNOSIS — R918 Other nonspecific abnormal finding of lung field: Secondary | ICD-10-CM | POA: Diagnosis not present

## 2014-10-09 DIAGNOSIS — E785 Hyperlipidemia, unspecified: Secondary | ICD-10-CM | POA: Diagnosis present

## 2014-10-09 DIAGNOSIS — R069 Unspecified abnormalities of breathing: Secondary | ICD-10-CM | POA: Diagnosis not present

## 2014-10-09 HISTORY — DX: Pneumothorax, unspecified: J93.9

## 2014-10-09 LAB — BRAIN NATRIURETIC PEPTIDE: B NATRIURETIC PEPTIDE 5: 81.7 pg/mL (ref 0.0–100.0)

## 2014-10-09 LAB — I-STAT TROPONIN, ED: Troponin i, poc: 0.01 ng/mL (ref 0.00–0.08)

## 2014-10-09 LAB — CBC WITH DIFFERENTIAL/PLATELET
BASOS PCT: 1 % (ref 0–1)
Basophils Absolute: 0 10*3/uL (ref 0.0–0.1)
EOS PCT: 1 % (ref 0–5)
Eosinophils Absolute: 0 10*3/uL (ref 0.0–0.7)
HCT: 43.7 % (ref 36.0–46.0)
Hemoglobin: 13.1 g/dL (ref 12.0–15.0)
LYMPHS PCT: 23 % (ref 12–46)
Lymphs Abs: 1.2 10*3/uL (ref 0.7–4.0)
MCH: 25.9 pg — ABNORMAL LOW (ref 26.0–34.0)
MCHC: 30 g/dL (ref 30.0–36.0)
MCV: 86.5 fL (ref 78.0–100.0)
MONO ABS: 0.4 10*3/uL (ref 0.1–1.0)
Monocytes Relative: 8 % (ref 3–12)
Neutro Abs: 3.4 10*3/uL (ref 1.7–7.7)
Neutrophils Relative %: 67 % (ref 43–77)
Platelets: 98 10*3/uL — ABNORMAL LOW (ref 150–400)
RBC: 5.05 MIL/uL (ref 3.87–5.11)
RDW: 19.3 % — AB (ref 11.5–15.5)
WBC: 5.1 10*3/uL (ref 4.0–10.5)

## 2014-10-09 LAB — COMPREHENSIVE METABOLIC PANEL
ALBUMIN: 3.3 g/dL — AB (ref 3.5–5.0)
ALK PHOS: 56 U/L (ref 38–126)
ALT: 12 U/L — ABNORMAL LOW (ref 14–54)
AST: 16 U/L (ref 15–41)
Anion gap: 8 (ref 5–15)
BUN: 16 mg/dL (ref 6–20)
CO2: 31 mmol/L (ref 22–32)
Calcium: 8.6 mg/dL — ABNORMAL LOW (ref 8.9–10.3)
Chloride: 99 mmol/L — ABNORMAL LOW (ref 101–111)
Creatinine, Ser: 0.83 mg/dL (ref 0.44–1.00)
GFR calc Af Amer: >60 mL/min (ref >60)
Glucose, Bld: 112 mg/dL — ABNORMAL HIGH (ref 65–99)
POTASSIUM: 4 mmol/L (ref 3.5–5.1)
Sodium: 138 mmol/L (ref 135–145)
TOTAL PROTEIN: 6.6 g/dL (ref 6.5–8.1)
Total Bilirubin: 0.4 mg/dL (ref 0.3–1.2)

## 2014-10-09 MED ORDER — SODIUM CHLORIDE 0.9 % IJ SOLN: 3.0000 mL | INTRAMUSCULAR | Status: DC | PRN

## 2014-10-09 MED ORDER — ONDANSETRON HCL 4 MG PO TABS
4.0000 mg | ORAL_TABLET | Freq: Four times a day (QID) | ORAL | Status: DC | PRN
Start: 2014-10-09 — End: 2014-10-19

## 2014-10-09 MED ORDER — PANTOPRAZOLE SODIUM 40 MG PO TBEC
40.0000 mg | DELAYED_RELEASE_TABLET | Freq: Every day | ORAL | Status: DC
  Administered 2014-10-10 – 2014-10-19 (×10): 40 mg via ORAL
  Filled 2014-10-09 (×7): qty 1

## 2014-10-09 MED ORDER — DOCUSATE SODIUM 100 MG PO CAPS
100.0000 mg | ORAL_CAPSULE | Freq: Two times a day (BID) | ORAL | Status: DC
  Administered 2014-10-09 – 2014-10-19 (×21): 100 mg via ORAL
  Filled 2014-10-09 (×22): qty 1

## 2014-10-09 MED ORDER — SODIUM CHLORIDE 0.9 % IV SOLN
INTRAVENOUS | Status: AC | PRN
  Administered 2014-10-09: 10 mL/h via INTRAVENOUS

## 2014-10-09 MED ORDER — DIGOXIN 250 MCG PO TABS
0.2500 mg | ORAL_TABLET | Freq: Every day | ORAL | Status: DC
  Administered 2014-10-09 – 2014-10-13 (×5): 0.25 mg via ORAL
  Filled 2014-10-09 (×5): qty 1

## 2014-10-09 MED ORDER — FLUTICASONE PROPIONATE 50 MCG/ACT NA SUSP
1.0000 | Freq: Every day | NASAL | Status: DC
  Filled 2014-10-09: qty 16

## 2014-10-09 MED ORDER — SODIUM CHLORIDE 0.9 % IJ SOLN
3.0000 mL | Freq: Two times a day (BID) | INTRAMUSCULAR | Status: DC
  Administered 2014-10-09 – 2014-10-19 (×15): 3 mL via INTRAVENOUS

## 2014-10-09 MED ORDER — FENTANYL CITRATE (PF) 100 MCG/2ML IJ SOLN
25.0000 ug | Freq: Once | INTRAMUSCULAR | Status: AC
  Administered 2014-10-09: 25 ug via INTRAVENOUS
  Filled 2014-10-09: qty 2

## 2014-10-09 MED ORDER — ALBUTEROL SULFATE (2.5 MG/3ML) 0.083% IN NEBU
2.5000 mg | INHALATION_SOLUTION | Freq: Four times a day (QID) | RESPIRATORY_TRACT | Status: DC | PRN
  Administered 2014-10-11 – 2014-10-19 (×16): 2.5 mg via RESPIRATORY_TRACT
  Filled 2014-10-09 (×17): qty 3

## 2014-10-09 MED ORDER — BUDESONIDE-FORMOTEROL FUMARATE 160-4.5 MCG/ACT IN AERO
2.0000 | INHALATION_SPRAY | Freq: Two times a day (BID) | RESPIRATORY_TRACT | Status: DC
  Administered 2014-10-09 – 2014-10-19 (×20): 2 via RESPIRATORY_TRACT
  Filled 2014-10-09: qty 6

## 2014-10-09 MED ORDER — ATORVASTATIN CALCIUM 40 MG PO TABS
40.0000 mg | ORAL_TABLET | Freq: Every day | ORAL | Status: DC
  Administered 2014-10-09 – 2014-10-18 (×10): 40 mg via ORAL
  Filled 2014-10-09 (×12): qty 1

## 2014-10-09 MED ORDER — METOPROLOL SUCCINATE 12.5 MG HALF TABLET
12.5000 mg | ORAL_TABLET | Freq: Every day | ORAL | Status: DC
  Administered 2014-10-09 – 2014-10-19 (×11): 12.5 mg via ORAL
  Filled 2014-10-09 (×12): qty 1

## 2014-10-09 MED ORDER — FERROUS FUMARATE 325 (106 FE) MG PO TABS: 1.0000 | ORAL_TABLET | Freq: Three times a day (TID) | ORAL | Status: DC

## 2014-10-09 MED ORDER — ACETAMINOPHEN 650 MG RE SUPP: 650.0000 mg | Freq: Four times a day (QID) | RECTAL | Status: DC | PRN

## 2014-10-09 MED ORDER — FLUTICASONE PROPIONATE 50 MCG/ACT NA SUSP
1.0000 | Freq: Every day | NASAL | Status: DC | PRN
  Filled 2014-10-09: qty 16

## 2014-10-09 MED ORDER — LIDOCAINE HCL 1 % IJ SOLN
INTRAMUSCULAR | Status: AC
  Filled 2014-10-09: qty 20

## 2014-10-09 MED ORDER — ONDANSETRON HCL 4 MG/2ML IJ SOLN
4.0000 mg | Freq: Once | INTRAMUSCULAR | Status: AC
  Administered 2014-10-09: 4 mg via INTRAVENOUS
  Filled 2014-10-09: qty 2

## 2014-10-09 MED ORDER — OXYCODONE HCL 5 MG PO TABS
5.0000 mg | ORAL_TABLET | ORAL | Status: DC | PRN
  Administered 2014-10-09 – 2014-10-10 (×2): 5 mg via ORAL
  Filled 2014-10-09 (×2): qty 1

## 2014-10-09 MED ORDER — MIDAZOLAM HCL 2 MG/2ML IJ SOLN
INTRAMUSCULAR | Status: AC
  Filled 2014-10-09: qty 2

## 2014-10-09 MED ORDER — ALBUTEROL SULFATE HFA 108 (90 BASE) MCG/ACT IN AERS: 2.0000 | INHALATION_SPRAY | Freq: Four times a day (QID) | RESPIRATORY_TRACT | Status: DC | PRN

## 2014-10-09 MED ORDER — ASPIRIN 81 MG PO CHEW
324.0000 mg | CHEWABLE_TABLET | Freq: Once | ORAL | Status: AC
  Administered 2014-10-09: 324 mg via ORAL
  Filled 2014-10-09: qty 4

## 2014-10-09 MED ORDER — TRAMADOL HCL 50 MG PO TABS: 50.0000 mg | ORAL_TABLET | Freq: Four times a day (QID) | ORAL | Status: DC | PRN

## 2014-10-09 MED ORDER — FERROUS SULFATE 325 (65 FE) MG PO TABS
325.0000 mg | ORAL_TABLET | Freq: Every day | ORAL | Status: DC
  Administered 2014-10-09 – 2014-10-19 (×11): 325 mg via ORAL
  Filled 2014-10-09 (×12): qty 1

## 2014-10-09 MED ORDER — FUROSEMIDE 20 MG PO TABS
20.0000 mg | ORAL_TABLET | Freq: Every day | ORAL | Status: DC
  Administered 2014-10-09: 20 mg via ORAL
  Filled 2014-10-09 (×2): qty 1

## 2014-10-09 MED ORDER — OXYBUTYNIN CHLORIDE 5 MG PO TABS
10.0000 mg | ORAL_TABLET | Freq: Every day | ORAL | Status: DC
  Administered 2014-10-09 – 2014-10-18 (×10): 10 mg via ORAL
  Filled 2014-10-09 (×12): qty 2

## 2014-10-09 MED ORDER — TIOTROPIUM BROMIDE MONOHYDRATE 18 MCG IN CAPS
18.0000 ug | ORAL_CAPSULE | Freq: Every day | RESPIRATORY_TRACT | Status: DC
  Administered 2014-10-10 – 2014-10-19 (×9): 18 ug via RESPIRATORY_TRACT
  Filled 2014-10-09 (×2): qty 5

## 2014-10-09 MED ORDER — SODIUM CHLORIDE 0.9 % IJ SOLN
3.0000 mL | Freq: Two times a day (BID) | INTRAMUSCULAR | Status: DC
  Administered 2014-10-10 – 2014-10-17 (×5): 3 mL via INTRAVENOUS

## 2014-10-09 MED ORDER — SODIUM CHLORIDE 0.9 % IV SOLN: 250.0000 mL | INTRAVENOUS | Status: DC | PRN

## 2014-10-09 MED ORDER — MIDAZOLAM HCL 2 MG/2ML IJ SOLN
INTRAMUSCULAR | Status: AC | PRN
  Administered 2014-10-09: 0.5 mg via INTRAVENOUS

## 2014-10-09 MED ORDER — ACETAMINOPHEN 325 MG PO TABS
650.0000 mg | ORAL_TABLET | Freq: Four times a day (QID) | ORAL | Status: DC | PRN
  Administered 2014-10-09 – 2014-10-11 (×4): 650 mg via ORAL
  Filled 2014-10-09 (×4): qty 2

## 2014-10-09 MED ORDER — FENTANYL CITRATE (PF) 100 MCG/2ML IJ SOLN
INTRAMUSCULAR | Status: AC
  Filled 2014-10-09: qty 2

## 2014-10-09 MED ORDER — FENTANYL CITRATE (PF) 100 MCG/2ML IJ SOLN
INTRAMUSCULAR | Status: AC | PRN
  Administered 2014-10-09: 12.5 ug via INTRAVENOUS

## 2014-10-09 MED ORDER — ONDANSETRON HCL 4 MG/2ML IJ SOLN
4.0000 mg | Freq: Four times a day (QID) | INTRAMUSCULAR | Status: DC | PRN
  Administered 2014-10-10: 4 mg via INTRAVENOUS
  Filled 2014-10-09: qty 2

## 2014-10-09 MED ORDER — LIDOCAINE-EPINEPHRINE 1 %-1:100000 IJ SOLN
INTRAMUSCULAR | Status: AC
  Filled 2014-10-09: qty 1

## 2014-10-09 MED ORDER — DILTIAZEM HCL ER COATED BEADS 240 MG PO CP24
240.0000 mg | ORAL_CAPSULE | Freq: Every day | ORAL | Status: DC
  Administered 2014-10-10 – 2014-10-19 (×10): 240 mg via ORAL
  Filled 2014-10-09 (×12): qty 1

## 2014-10-09 NOTE — Procedures (Signed)
Interventional Radiology Procedure Note  Procedure: Placement of a right 1F thoracostomy tube for PTX.  Marland Kitchen  Complications: None  Recommendations:  - to [facility]rium suction - Routine care   Signed,  Dulcy Fanny. Earleen Newport, DO

## 2014-10-09 NOTE — ED Provider Notes (Signed)
Presents with shortness of breath onset last night. Feels improved since treatment by EMS with nebulizer. On exam appears comfortable no distress speaks in paragraphs lungs diminished breath sounds diffusely. Chest x-ray viewed by me.Dr. Nils Pyle consulted for defintive   care  Orlie Dakin, MD 10/09/14 [phone]

## 2014-10-09 NOTE — H&P (Signed)
Subjective:   Patient is a 74 y.o. female who is well known to [facility].  She has a history of Atrial Fibrillation for which she takes Xarelto, HTN, GERD, and anemia.  She has known history of advanced COPD on home oxygen.  She underwent a right VATS procedure 10 years ago.  That procedure consisted of bled resection and pleurodesis of the upper space.  The patient has done well since that time.  However, on 09/14/2014 she presented to the [facility] ED with complaints of upper respiratory infection symptoms, severe cough, and right sided chest pain with worsening shortness of breath.  CXR at that time showed a 30% right sided pneumothorax.  She was evaluated by [name] at that time who recommended placement of a right sided chest tube.  This was performed with resolution of her pneumothorax.  Her hospital course was uncomplicated and she was later discharged home on 09/17/2014 at which time her CXR was free from pneumothorax, but did have some evidence of subcutaneous emphysema present.  The patient did well after discharge and was seen in follow up at [place] on 09/30/2014.  At that visit the patient had no complaints.  Repeat CXR was obtained and was again free from pneumothorax. However, the patient presents to [facility] ED today 10/09/2014.  She states that she had been feeling more short of breath since last night. She uses oxygen at home and states it was not making much difference. She contacted EMS who transported patient to the hospital.  Initial assessment showed sats of 90%, she was given a duoneb on transport and her sats improved to 94%.  Upon assessment in the ED a CXR was obtained and showed evidence of a recurrent right sided pneumothorax, felt to be 30%.  [facility] is consulted for management.   Patient Active Problem List   Diagnosis Date Noted  . Pneumothorax on right 10/09/2014  . Pneumothorax on left 09/14/2014  . Gross hematuria   . Hematuria 06/10/2014  . Cystitis, acute hemorrhagic 06/08/2014   . Hematochezia 06/08/2014  . Acute pyelonephritis 04/28/2014  . COLD (chronic obstructive lung disease)   . Essential hypertension   . CAP (community acquired pneumonia) 04/27/2014  . SOB (shortness of breath) 04/27/2014  . Microcytic anemia 04/27/2014  . Hemorrhagic cystitis 08/03/2013  . Anemia due to acute blood loss 08/03/2013  . COPD exacerbation 07/08/2013  . COPD with acute exacerbation 07/08/2013  . Paroxysmal atrial fibrillation 02/03/2012  . UTI (lower urinary tract infection) 02/02/2012  . Thrombocytopenia 02/02/2012  . Fracture of right hip 01/29/2012  . Multiple rib fractures 01/29/2012  . Fall due to stumbling 01/29/2012  . CAD (coronary artery disease) 01/29/2012  . Hypertension 01/29/2012  . COPD (chronic obstructive pulmonary disease) 01/29/2012  . CKD (chronic kidney disease) 01/29/2012  . Osteoarthritis 01/29/2012  . Closed fracture of right distal radius 10/13/2011    Class: Acute   Past Medical History  Diagnosis Date  . Hypertension   . Shortness of breath   . Asthma   . COPD (chronic obstructive pulmonary disease)     emphysema    . Pneumonia     hx pneumonia ,chronic bronchitis   . H/O blood clots   . Chronic kidney disease     current   UTI   being treated  . GERD (gastroesophageal reflux disease)   . Headache(784.0)     hx migraines  . Blood dyscrasia     hx blood clotts  . Arthritis   .  Oxygen deficiency   . Hyperlipidemia   . Blood transfusion without reported diagnosis   . Anemia     Past Surgical History  Procedure Laterality Date  . Right leg    . Hip fracture surgery      RIGHT SIDE  . Shoulder surgery      RIGHT  . Cholecystectomy    . Eye surgery      BIL CATARACT   . Orif wrist fracture  10/13/2011    Procedure: OPEN REDUCTION INTERNAL FIXATION (ORIF) WRIST FRACTURE;  Surgeon: [name], MD;  Location: [place];  Service: Orthopedics;  Laterality: Right;  Open Reduction Internal Fixation Right Distal Radius   . Abdominal  hysterectomy    . Btl    . Hardware removal  01/29/2012    Procedure: HARDWARE REMOVAL;  Surgeon: [name], MD;  Location: [place];  Service: Orthopedics;  Laterality: Right;  . Hip arthroplasty  01/29/2012    Procedure: ARTHROPLASTY BIPOLAR HIP;  Surgeon: [name], MD;  Location: [place];  Service: Orthopedics;  Laterality: Right;  . Cardiac catheterization    . Transurethral resection of bladder tumor N/A 06/11/2014    Procedure: TRANSURETHRAL RESECTION OF BLADDER TUMOR (TURBT);  Surgeon: [name], MD;  Location: [facility];  Service: Urology;  Laterality: N/A;  . Cystoscopy w/ retrogrades Bilateral 06/11/2014    Procedure: CYSTOSCOPY WITH RETROGRADE PYELOGRAM;  Surgeon: [name], MD;  Location: [facility];  Service: Urology;  Laterality: Bilateral;     (Not in a hospital admission) No Known Allergies  History  Substance Use Topics  . Smoking status: Former Smoker -- 1.00 packs/day for 50 years    Types: Cigarettes    Quit date: 10/27/2013  . Smokeless tobacco: Not on file  . Alcohol Use: No    Family History  Problem Relation Age of Onset  . Cancer - Lung Mother   . Coronary artery disease Mother   . Coronary artery disease Sister   . Coronary artery disease Brother     Review of Systems  Pertinent items are noted in HPI.  Objective:   Patient Vitals for the past 8 hrs:  BP Temp Temp src Pulse Resp SpO2  10/09/14 0915 (!) 116/41 mmHg - - 67 17 97 %  10/09/14 0900 (!) 117/45 mmHg - - 64 12 96 %  10/09/14 0833 113/65 mmHg - - 72 12 99 %  10/09/14 0745 (!) 123/48 mmHg 97.9 F (36.6 C) Oral 78 21 91 %   BP 121/44 mmHg  Pulse 61  Temp(Src) 97.9 F (36.6 C) (Oral)  Resp 15  SpO2 98% General appearance: alert, cooperative and no distress Head: Normocephalic, without obvious abnormality, atraumatic Lungs: diminished breath sounds scattered Heart: regular rate and rhythm Abdomen: soft, non-tender; bowel sounds normal; no masses,  no organomegaly Extremities:  extremities normal, atraumatic, no cyanosis or edema Neurologic: Grossly normal   CXR:  Right sided pneumothorax, without evidence of tension  Assessment:   74 yo female with severe COPD known to [facility].  She presents with recurrent right sided pneumothorax confirmed with CXR.   Plan:    1. Admit to 2w on Telemetry 2. Right Sided Pneumothorax- recurrent, H/O of VATS 10 years ago for same issue, given patient's advance COPD she is not a surgical candidate.  Will ask IR for CT guided placement of chest tube 3. CV- H/O A. Fib on Xarelto- will hold Xarelto for now, continue Digoxin, Cardizem, and Toprol 4. GERD- will restart  home Protonix 5. Severe COPD- uses oxygen continuously at home, will restart home inhalers, nebulizer treatments 6. Anemia- Iron per home regimen 7. Dispo- patient is current stable, awaiting admission to 2W

## 2014-10-09 NOTE — ED Provider Notes (Signed)
CSN: [id number]     Arrival date & time 10/09/14  0277 History   First MD Initiated Contact with Patient 10/09/14 [phone]     Chief Complaint  Patient presents with  . Shortness of Breath     (Consider location/radiation/quality/duration/timing/severity/associated sxs/prior Treatment) The history is provided by the patient.    [name] [name] Is a 74 year old female who presents via EMS for shortness of breath. She has a past medical history of COPD with oxygen dependence normally on 2 L via nasal cannula. 2. History of asthma, history of pneumonia, she has blood dyscrasia with history of hyper-coagulation and blood clots. Currently on Xarelto daily, with which she endorses compliance. She has a recent history of spontaneous right-sided pneumothorax. Patient underwent VATS procedure with pleurodesis and blebectomy earlier this month.She is attended by her son. The patient states that she had some difficulty breathing starting last evening. She began having worsening pain , difficulty breathing throughout the night and complains of central chest pain which is worse with breathing. She endorsed headache and abdominal pain, which she suspects is secondary to heavy coughing. She denies much wheezing. This morning she took one neb treatment at home which gave her slight amount of improvement. Her son states that she is very tolerant of pain and discomfort and that for her to call him about her pain is very out of character for her, so he was very concerned.  She denies diaphoresis, and nausea.  She denies sudden onset of pain and dyspnea. She has had some mild swelling in her hands and feet. She denies fever, chills or other signs of systemic infection. She states that the pain she is having today is different from her postsurgical pain.  Past Medical History  Diagnosis Date  . Hypertension   . Shortness of breath   . Asthma   . COPD (chronic obstructive pulmonary disease)     emphysema    . Pneumonia      hx pneumonia ,chronic bronchitis   . H/O blood clots   . Chronic kidney disease     current   UTI   being treated  . GERD (gastroesophageal reflux disease)   . Headache(784.0)     hx migraines  . Blood dyscrasia     hx blood clotts  . Arthritis   . Oxygen deficiency   . Hyperlipidemia   . Blood transfusion without reported diagnosis   . Anemia    Past Surgical History  Procedure Laterality Date  . Right leg    . Hip fracture surgery      RIGHT SIDE  . Shoulder surgery      RIGHT  . Cholecystectomy    . Eye surgery      BIL CATARACT   . Orif wrist fracture  10/13/2011    Procedure: OPEN REDUCTION INTERNAL FIXATION (ORIF) WRIST FRACTURE;  Surgeon: [name], MD;  Location: [place];  Service: Orthopedics;  Laterality: Right;  Open Reduction Internal Fixation Right Distal Radius   . Abdominal hysterectomy    . Btl    . Hardware removal  01/29/2012    Procedure: HARDWARE REMOVAL;  Surgeon: [name], MD;  Location: [place];  Service: Orthopedics;  Laterality: Right;  . Hip arthroplasty  01/29/2012    Procedure: ARTHROPLASTY BIPOLAR HIP;  Surgeon: [name], MD;  Location: [place];  Service: Orthopedics;  Laterality: Right;  . Cardiac catheterization    . Transurethral resection of bladder tumor N/A 06/11/2014  Procedure: TRANSURETHRAL RESECTION OF BLADDER TUMOR (TURBT);  Surgeon: [name], MD;  Location: [facility];  Service: Urology;  Laterality: N/A;  . Cystoscopy w/ retrogrades Bilateral 06/11/2014    Procedure: CYSTOSCOPY WITH RETROGRADE PYELOGRAM;  Surgeon: [name], MD;  Location: [facility];  Service: Urology;  Laterality: Bilateral;   Family History  Problem Relation Age of Onset  . Cancer - Lung Mother   . Coronary artery disease Mother   . Coronary artery disease Sister   . Coronary artery disease Brother    History  Substance Use Topics  . Smoking status: Former Smoker -- 1.00 packs/day for 50 years    Types: Cigarettes    Quit date: 10/27/2013  .  Smokeless tobacco: Not on file  . Alcohol Use: No   OB History    No data available     Review of Systems  Ten systems reviewed and are negative for acute change, except as noted in the HPI.    Allergies  Review of patient's allergies indicates no known allergies.  Home Medications   Prior to Admission medications   Medication Sig Start Date End Date Taking? Authorizing Provider  albuterol (PROVENTIL) (2.5 MG/3ML) 0.083% nebulizer solution Take 2.5 mg by nebulization every 6 (six) hours as needed for wheezing or shortness of breath.     Historical Provider, MD  atorvastatin (LIPITOR) 40 MG tablet Take 40 mg by mouth every morning.  07/02/13   Historical Provider, MD  budesonide-formoterol (SYMBICORT) 160-4.5 MCG/ACT inhaler Inhale 2 puffs into the lungs 2 (two) times daily. 08/06/13   Barton Dubois, MD  digoxin (LANOXIN) 0.25 MG tablet Take 0.125 mg by mouth every morning.     Historical Provider, MD  diltiazem (CARDIZEM CD) 240 MG 24 hr capsule Take 240 mg by mouth every morning.  04/08/14   Historical Provider, MD  docusate sodium (COLACE) 100 MG capsule Take 100 mg by mouth daily as needed (constipation).     Historical Provider, MD  Ferrous Sulfate (IRON) 325 (65 FE) MG TABS Take 325 mg by mouth daily.    Historical Provider, MD  fluticasone (FLONASE) 50 MCG/ACT nasal spray Place 1 spray into both nostrils daily as needed for allergies or rhinitis. 08/06/13   Barton Dubois, MD  furosemide (LASIX) 20 MG tablet Take 1 tablet (20 mg total) by mouth daily. 06/13/14   Theodis Blaze, MD  iron polysaccharides (NIFEREX) 150 MG capsule Take 1 capsule (150 mg total) by mouth daily. Patient not taking: Reported on 10/07/2014 04/29/14   Annita Brod, MD  levofloxacin (LEVAQUIN) 500 MG tablet Take 1 tablet (500 mg total) by mouth daily. For 5 days then stop. Patient not taking: Reported on 10/07/2014 09/19/14   Nani Skillern, PA-C  metoprolol succinate (TOPROL-XL) 50 MG 24 hr tablet Take  12.5 mg by mouth daily. Take with or immediately following a meal.    Historical Provider, MD  Omega-3 Fatty Acids (FISH OIL) 1200 MG CAPS Take 1,200 mg by mouth every morning.     Historical Provider, MD  oxybutynin (DITROPAN-XL) 5 MG 24 hr tablet Take 10 mg by mouth at bedtime.    Historical Provider, MD  pantoprazole (PROTONIX) 40 MG tablet Take 1 tablet (40 mg total) by mouth daily. 09/17/62   Delora Fuel, MD  rivaroxaban (XARELTO) 20 MG TABS tablet Take 20 mg by mouth daily with supper.    Historical Provider, MD  tiotropium (SPIRIVA) 18 MCG inhalation capsule Place 1 capsule (18 mcg total) into inhaler  and inhale daily. 08/06/13   Barton Dubois, MD  traMADol (ULTRAM) 50 MG tablet Take 1 tablet (50 mg total) by mouth every 6 (six) hours as needed for moderate pain (pain). 09/19/14   Donielle Liston Alba, PA-C  VENTOLIN HFA 108 (90 BASE) MCG/ACT inhaler Inhale 2 puffs into the lungs 4 (four) times daily as needed for wheezing or shortness of breath.  06/07/14   Historical Provider, MD  vitamin B-12 (CYANOCOBALAMIN) 500 MCG tablet Take 1,000 mcg by mouth daily.     Historical Provider, MD   BP 123/48 mmHg  Pulse 78  Temp(Src) 97.9 F (36.6 C) (Oral)  Resp 21  SpO2 91% Physical Exam  Constitutional: She is oriented to person, place, and time. She appears well-developed and well-nourished. No distress.  Appearance consistent with chronic emphysema, thin. No distress.  HENT:  Head: Normocephalic and atraumatic.  Eyes: Conjunctivae are normal. No scleral icterus.  Neck: Normal range of motion. No JVD present.  Cardiovascular: Normal rate, regular rhythm, normal heart sounds and intact distal pulses.  Exam reveals no gallop and no friction rub.   No murmur heard. Trace edema in the ankles  Pulmonary/Chest: Effort normal and breath sounds normal. No respiratory distress. Wheezes: minimal expiratory wheeze. She exhibits tenderness (tender to palpation over the central chest. Well healing surgical  incision site on the right lateral chest wall).  Abdominal: Soft. Bowel sounds are normal. She exhibits no distension and no mass. There is no tenderness. There is no guarding.  Neurological: She is alert and oriented to person, place, and time.  Skin: Skin is warm and dry. She is not diaphoretic.  Nursing note and vitals reviewed.   ED Course  Procedures (including critical care time) Labs Review Labs Reviewed  BRAIN NATRIURETIC PEPTIDE  CBC WITH DIFFERENTIAL/PLATELET  COMPREHENSIVE METABOLIC PANEL  I-STAT [place], ED    Imaging Review No results found.   EKG Interpretation   Date/Time:  Wednesday October 09 2014 07:54:22 EDT Ventricular Rate:  77 PR Interval:  145 QRS Duration: 84 QT Interval:  355 QTC Calculation: 402 R Axis:   81 Text Interpretation:  Sinus rhythm RAE, consider biatrial enlargement  Borderline right axis deviation Probable LVH with secondary repol abnrm No  significant change since last tracing Confirmed by Winfred Leeds  MD, SAM  ([id number]) on 10/09/2014 8:09:20 AM      MDM   Final diagnoses:  SOB (shortness of breath)    8:36 AM BP 123/48 mmHg  Pulse 78  Temp(Src) 97.9 F (36.6 C) (Oral)  Resp 21  SpO2 91% Patient here with chest pain and shortness of breath. She does not appear to have any respiratory distress at this time. EKG does not show any signs of acute ischemia and is unchanged from previous.    Patient cxr shows that her pneumothorax has recurred on the right side. Appears larger than her last pneumothorax which was diagnosed on 09/18/2014. I've spoken with Dr. Nils Pyle . He will see and admit the patient from the emergency department. She is hemodynamically stable throughout her visit.  Margarita Mail, PA-C 10/09/14 [place], MD 10/09/14 [phone]

## 2014-10-09 NOTE — H&P (Signed)
Chief Complaint: Patient was seen in consultation today for  Chief Complaint  Patient presents with  . Shortness of Breath   and recurrent right pneumothorax at the request of [name]  Referring Physician(s): [name]  History of Present Illness: [name] [name] is a 74 y.o. 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   She underwent a right VATS procedure 10 years ago. That procedure consisted of bled resection and pleurodesis of the upper space. She has done well since that time.   Then on 09/14/2014 she presented to the [facility] ED with complaints of upper respiratory infection symptoms, severe cough, and right sided chest pain with worsening shortness of breath. CXR at that time showed a 30% right sided pneumothorax.   She was evaluated by [name] at that time who recommended placement of a right sided chest tube. This was performed with resolution of her pneumothorax.   Her hospital course was uncomplicated and she was later discharged home on 09/17/2014 at which time her CXR was free from pneumothorax, but did have some evidence of subcutaneous emphysema present.   She did well after discharge and was seen in follow up at [facility] on 09/30/2014. At that visit the patient had no complaints. Repeat CXR was obtained and was again free from pneumothorax.   She presents to [facility] ED today 10/09/2014. She states that she had been feeling more short of breath since last night. She uses oxygen at home and states it was not making much difference.   Upon assessment in the ED a CXR was obtained and showed evidence of a recurrent 40% right sided pneumothorax  She also has a history of Atrial Fibrillation for which she takes Xarelto. Last dose of Xarelto was yesterday at 7 pm.  Past Medical History  Diagnosis Date  . Hypertension   . Shortness of breath   . Asthma   . COPD (chronic obstructive pulmonary disease)     emphysema    .  Pneumonia     hx pneumonia ,chronic bronchitis   . H/O blood clots   . Chronic kidney disease     current   UTI   being treated  . GERD (gastroesophageal reflux disease)   . Headache(784.0)     hx migraines  . Blood dyscrasia     hx blood clotts  . Arthritis   . Oxygen deficiency   . Hyperlipidemia   . Blood transfusion without reported diagnosis   . Anemia     Past Surgical History  Procedure Laterality Date  . Right leg    . Hip fracture surgery      RIGHT SIDE  . Shoulder surgery      RIGHT  . Cholecystectomy    . Eye surgery      BIL CATARACT   . Orif wrist fracture  10/13/2011    Procedure: OPEN REDUCTION INTERNAL FIXATION (ORIF) WRIST FRACTURE;  Surgeon: [name], MD;  Location: [place];  Service: Orthopedics;  Laterality: Right;  Open Reduction Internal Fixation Right Distal Radius   . Abdominal hysterectomy    . Btl    . Hardware removal  01/29/2012    Procedure: HARDWARE REMOVAL;  Surgeon: [name], MD;  Location: [place];  Service: Orthopedics;  Laterality: Right;  . Hip arthroplasty  01/29/2012    Procedure: ARTHROPLASTY BIPOLAR HIP;  Surgeon: [name], MD;  Location: [place];  Service: Orthopedics;  Laterality: Right;  . Cardiac catheterization    . Transurethral resection of bladder tumor N/A 06/11/2014    Procedure: TRANSURETHRAL RESECTION OF BLADDER TUMOR (TURBT);  Surgeon: [name], MD;  Location: [facility];  Service: Urology;  Laterality: N/A;  . Cystoscopy w/ retrogrades Bilateral 06/11/2014    Procedure: CYSTOSCOPY WITH RETROGRADE PYELOGRAM;  Surgeon: [name], MD;  Location: [facility];  Service: Urology;  Laterality: Bilateral;    Allergies: Review of patient's allergies indicates no known allergies.  Medications: Prior to Admission medications   Medication Sig Start Date End Date Taking? Authorizing Provider  albuterol (PROVENTIL) (2.5 MG/3ML) 0.083% nebulizer solution Take 2.5 mg by nebulization every 6 (six) hours as needed for  wheezing or shortness of breath.    Yes Historical Provider, MD  atorvastatin (LIPITOR) 40 MG tablet Take 40 mg by mouth every morning.  07/02/13  Yes Historical Provider, MD  budesonide-formoterol (SYMBICORT) 160-4.5 MCG/ACT inhaler Inhale 2 puffs into the lungs 2 (two) times daily. 08/06/13  Yes Barton Dubois, MD  digoxin (LANOXIN) 0.25 MG tablet Take 0.125 mg by mouth every morning.    Yes Historical Provider, MD  diltiazem (CARDIZEM CD) 240 MG 24 hr capsule Take 240 mg by mouth every morning.  04/08/14  Yes Historical Provider, MD  docusate sodium (COLACE) 100 MG capsule Take 100 mg by mouth daily as needed (constipation).    Yes Historical Provider, MD  Ferrous Sulfate (IRON) 325 (65 FE) MG TABS Take 325 mg by mouth daily.   Yes Historical Provider, MD  fluticasone (FLONASE) 50 MCG/ACT nasal spray Place 1 spray into both nostrils daily as needed for allergies or rhinitis. 08/06/13  Yes Barton Dubois, MD  furosemide (LASIX) 20 MG tablet Take 1 tablet (20 mg total) by mouth daily. Patient taking differently: Take 40 mg by mouth daily.  06/13/14  Yes Theodis Blaze, MD  metoprolol succinate (TOPROL-XL) 50 MG 24 hr tablet Take 12.5 mg by mouth daily. Take with or immediately following a meal.   Yes Historical Provider, MD  Omega-3 Fatty Acids (FISH OIL) 1000 MG CAPS Take 1,000 mg by mouth daily.   Yes Historical Provider, MD  oxybutynin (DITROPAN-XL) 5 MG 24 hr tablet Take 10 mg by mouth at bedtime.   Yes Historical Provider, MD  pantoprazole (PROTONIX) 40 MG tablet Take 1 tablet (40 mg total) by mouth daily. 0/7/62  Yes Delora Fuel, MD  rivaroxaban (XARELTO) 20 MG TABS tablet Take 20 mg by mouth daily with supper.   Yes Historical Provider, MD  tiotropium (SPIRIVA) 18 MCG inhalation capsule Place 1 capsule (18 mcg total) into inhaler and inhale daily. 08/06/13  Yes Barton Dubois, MD  traMADol (ULTRAM) 50 MG tablet Take 1 tablet (50 mg total) by mouth every 6 (six) hours as needed for moderate pain  (pain). Patient taking differently: Take 50 mg by mouth at bedtime as needed for moderate pain (pain).  09/19/14  Yes Donielle Liston Alba, PA-C  VENTOLIN HFA 108 (90 BASE) MCG/ACT inhaler Inhale 2 puffs into the lungs 4 (four) times daily as needed for wheezing or shortness of breath.  06/07/14  Yes Historical Provider, MD  vitamin B-12 (CYANOCOBALAMIN) 500 MCG tablet Take 1,000 mcg by mouth daily.    Yes Historical Provider, MD  iron polysaccharides (NIFEREX) 150 MG capsule Take 1 capsule (150 mg total) by mouth daily. Patient not taking: Reported on 10/07/2014 04/29/14   Annita Brod, MD  levofloxacin (LEVAQUIN) 500 MG tablet Take 1 tablet (500 mg total)  by mouth daily. For 5 days then stop. Patient not taking: Reported on 10/07/2014 09/19/14   Nani Skillern, PA-C     Family History  Problem Relation Age of Onset  . Cancer - Lung Mother   . Coronary artery disease Mother   . Coronary artery disease Sister   . Coronary artery disease Brother     History   Social History  . Marital Status: Widowed    Spouse Name: N/A  . Number of Children: N/A  . Years of Education: N/A   Social History Main Topics  . Smoking status: Former Smoker -- 1.00 packs/day for 50 years    Types: Cigarettes    Quit date: 10/27/2013  . Smokeless tobacco: Not on file  . Alcohol Use: No  . Drug Use: No  . Sexual Activity: Not on file   Other Topics Concern  . None   Social History Narrative     Review of Systems  Constitutional: Positive for fatigue. Negative for activity change and appetite change.  HENT: Negative.   Respiratory: Positive for shortness of breath. Negative for chest tightness.   Gastrointestinal: Negative for nausea, vomiting and abdominal pain.  Genitourinary: Negative.   Musculoskeletal: Negative.   Skin: Negative.   Neurological: Negative.   Psychiatric/Behavioral: Negative.     Vital Signs: BP 124/44 mmHg  Pulse 57  Temp(Src) 97.9 F (36.6 C) (Oral)  Resp 11   SpO2 98%  Physical Exam  Constitutional: She is oriented to person, place, and time. She appears well-developed and well-nourished.  She is in no acute distress given the fact she has a significant pneumothorax  HENT:  Head: Normocephalic and atraumatic.  Eyes: EOM are normal.  Neck: Normal range of motion. Neck supple.  Pulmonary/Chest: Effort normal.  Breath sounds absent on the right c/w pneumothorax  Abdominal: Soft. Bowel sounds are normal. There is no tenderness.  Musculoskeletal: Normal range of motion.  Neurological: She is alert and oriented to person, place, and time.  Skin: Skin is warm and dry.  Psychiatric: She has a normal mood and affect. Her behavior is normal. Judgment and thought content normal.  Vitals reviewed.   Mallampati Score:  MD Evaluation Airway: WNL Heart: WNL Abdomen: WNL Chest/ Lungs: Other (comments) Chest/ lungs comments: Breath sounds absent on right consistent with pneumothorax ASA  Classification: 2 Mallampati/Airway Score: Two  Imaging: Dg Chest 2 View  10/09/2014   CLINICAL DATA:  Mid chest pain shortness breath and cough since last night, history of COPD and asthma  EXAM: CHEST  2 VIEW  COMPARISON:  PA and lateral chest x-ray of September 30, 2014  FINDINGS: There is a 40% right mid to lower pneumothorax. No significant apical component is observed. There is no mediastinal shift. The left lung is well-expanded. The aerated portions of both lungs exhibit mild interstitial prominence. There is no alveolar infiltrate there is a tiny right pleural effusion. No parenchymal masses are observed. The heart is normal in size. The pulmonary vascularity is not engorged. There is old deformity of the right humeral head and neck. The thoracic vertebral bodies are preserved in height.  IMPRESSION: 1. There is approximately 40% right-sided pneumothorax. There is a trace right pleural effusion. There is underlying COPD and mild pulmonary fibrotic change and chronic  right apical scarring. 2. There is no CHF. 3. These results were called by telephone at the time of interpretation on 10/09/2014 at 9:03 am to Dr. Winfred Leeds, who verbally acknowledged these results.   Electronically  Signed   By: David  Martinique M.D.   On: 10/09/2014 09:04   Dg Chest 2 View  09/30/2014   CLINICAL DATA:  History of Previous pneumothorax treated with chest tube, emphysema, shortness of breath today  EXAM: CHEST  2 VIEW  COMPARISON:  PA and lateral chest x-ray of September 19, 2014  FINDINGS: The lungs remain hyperinflated. The interstitial markings remain coarse in the right perihilar region and in the apex. There is no pneumothorax. The subcutaneous emphysema on the right has resolved. There is no pleural effusion. The left lung is clear. The heart is normal in size. There is stable levocurvature centered in the mid thoracic spine. The bony thorax exhibits no acute abnormality. There is chronic deformity of the proximal right humerus.  IMPRESSION: Stable emphysematous changes bilaterally. There is no recurrent pneumothorax.   Electronically Signed   By: David  Martinique M.D.   On: 09/30/2014 12:48   Dg Chest 2 View  09/19/2014   CLINICAL DATA:  Emphysema.  Status post right chest tube removal.  EXAM: CHEST  2 VIEW  COMPARISON:  09/18/2014  FINDINGS: Subcutaneous emphysema again seen in the right chest wall, however no pneumothorax visualized. Pulmonary emphysema again demonstrated. The surgical staples again seen in the right lung apex. No evidence of acute infiltrate or edema. Heart size is within normal limits.  IMPRESSION: Pulmonary emphysema.  No pneumothorax identified.   Electronically Signed   By: Earle Gell M.D.   On: 09/19/2014 08:38   Dg Chest 2 View  09/14/2014   CLINICAL DATA:  One week history of cough, shortness of breath and chest tightness. Former smoker who quit approximately 9 months ago. Current history of hypertension, COPD and chronic kidney disease.  EXAM: CHEST  2 VIEW  COMPARISON:   CT chest 04/28/2014. Chest x-rays 04/27/2014 and earlier.  FINDINGS: Right basilar pneumothorax on the order of 30-40% or so. The upper right lung is tethered to the pleural by scar and did not collapse. Surgical suture material in the right upper lobe presumably related to prior bullectomy. Emphysematous changes throughout both lungs and prominent bronchovascular markings diffusely with moderate central peribronchial thickening, unchanged.  Cardiac silhouette upper normal in size. Thoracic aorta atherosclerotic, unchanged. Prominent central pulmonary arteries, unchanged. Degenerative changes throughout the thoracic spine.  IMPRESSION: 1. Moderate size right basilar pneumothorax on the order of 30-40% or so. The upper lung did not collapse and is therefore likely tethered to the pleural by scar. 2.  No acute cardiopulmonary disease otherwise. 3. COPD/emphysema. Critical Value/emergent results were called by telephone at the time of interpretation on 09/14/2014 at 12:48 pm to Dr. Davonna Belling, who verbally acknowledged these results.   Electronically Signed   By: Evangeline Dakin M.D.   On: 09/14/2014 12:48   Dg Chest Port 1 View  09/18/2014   CLINICAL DATA:  Status post chest tube removal.  EXAM: PORTABLE CHEST - 1 VIEW  COMPARISON:  09/18/2014 at 0559 hr  FINDINGS: Right chest tube has been removed. No pneumothorax is identified. Subcutaneous emphysema remains in the right chest wall and axillary region. Cardiomediastinal silhouette is unchanged. Scarring in the right greater than left lung apices does not appear significantly changed, with a staple line again seen in the right upper lung the. No pleural effusion is seen. There is mild elevation of the left hemidiaphragm with mild gaseous distension of the underlying splenic flexure. Chronic proximal right humerus fracture is noted.  IMPRESSION: Interval right chest tube removal.  No pneumothorax identified.  Electronically Signed   By: Logan Bores   On:  09/18/2014 11:56   Dg Chest Port 1 View  09/18/2014   CLINICAL DATA:  Follow-up of right-sided chest tube positioning  EXAM: PORTABLE CHEST - 1 VIEW  COMPARISON:  Portable chest x-ray of September 17, 2014  FINDINGS: The right-sided chest tube is unchanged in appearance with its tip projecting over the posterior aspect of the ninth rib. There is no pneumothorax or pleural effusion. There is no mediastinal shift. Both lungs are clear with exception of the apices where persistently increased interstitial markings are present. The heart and pulmonary vascularity are normal. There is gentle levocurvature centered in the mid thoracic spine. Subcutaneous emphysema persists in the right axillary region.  IMPRESSION: 1. The chest tube is in stable position. There is no pneumothorax. Stable increased interstitial density in the apices bilaterally. 2. There is no pneumonia nor CHF.   Electronically Signed   By: David  Martinique M.D.   On: 09/18/2014 07:34   Dg Chest Port 1 View  09/17/2014   CLINICAL DATA:  Right pneumothorax 09/14/2014.  Chest tube in place.  EXAM: PORTABLE CHEST - 1 VIEW  COMPARISON:  Single view of the chest 09/16/2014 and 09/15/2014.  FINDINGS: Right chest tube remains in place. No pneumothorax identified. The lungs are emphysematous. Apical scarring is worse on the right where the patient is status post prior pleurodesis. Heart size is normal.  IMPRESSION: No change in the appearance of the chest. No pneumothorax identified with a right chest tube in place.   Electronically Signed   By: Inge Rise M.D.   On: 09/17/2014 07:33   Dg Chest Port 1 View  09/16/2014   CLINICAL DATA:  Followup pneumothorax.  EXAM: PORTABLE CHEST - 1 VIEW  COMPARISON:  09/15/2014  FINDINGS: Stable right-sided basilar chest tube without definite right-sided pneumothorax. Stable surgical changes involving the right upper lobe. Resolving subcutaneous emphysema. Stable emphysematous changes and apical pulmonary scarring. No acute  overlying pulmonary process.  IMPRESSION: Stable right-sided chest tube without definite pneumothorax. Resolving subcutaneous emphysema.  Chronic emphysematous changes but no acute overlying pulmonary process.   Electronically Signed   By: Marijo Sanes M.D.   On: 09/16/2014 08:34   Portable Chest 1 View  09/15/2014   CLINICAL DATA:  Short of breath.  EXAM: PORTABLE CHEST - 1 VIEW  COMPARISON:  09/14/2014.  FINDINGS: Cardiopericardial silhouette within normal limits. Aortic arch atherosclerosis. The RIGHT thoracostomy tube has been slightly retracted and the tip is now just inferior to the level of the RIGHT hilum. Apical scarring is present with superior hilar retraction.  Soft tissue emphysema along the RIGHT chest wall is unchanged. Chronic deformity of the proximal RIGHT humerus.  No pneumothorax is visible. Severe emphysema. Pulmonary staples are present at the RIGHT apex adjacent to scarring.  IMPRESSION: 1. Unchanged RIGHT thoracostomy tube.  No pneumothorax. 2. Severe emphysema and bilateral pleural apical scarring similar to prior exam. No superimposed airspace disease or acute cardiopulmonary disease.   Electronically Signed   By: Dereck Ligas M.D.   On: 09/15/2014 08:25   Dg Chest Portable 1 View  09/14/2014   CLINICAL DATA:  Right pneumothorax.  Emphysema.  EXAM: PORTABLE CHEST - 1 VIEW to 11 p.m.  COMPARISON:  09/14/2014 at 11:57 a.m.  FINDINGS: Right chest tube has been inserted. There has been complete relief of the right pneumothorax. Surgical staples at the right apex. Left lung is clear. Heart size and vascularity are normal. Calcification and  tortuosity of the thoracic aorta. Subcutaneous emphysema on the right.  IMPRESSION: Right chest tube in good position with complete relief of the right pneumothorax. New right subcutaneous emphysema. Pulmonary emphysema.   Electronically Signed   By: Lorriane Shire M.D.   On: 09/14/2014 14:20    Labs:  CBC:  Recent Labs  09/14/14 1154  09/15/14 0614 09/17/14 0255 10/09/14 0827  WBC 5.8 14.3* 10.4 5.1  HGB 12.8 11.8* 11.6* 13.1  HCT 41.0 38.7 36.9 43.7  PLT 83* 92* 104* 98*    COAGS:  Recent Labs  10/14/13 1410  04/28/14 1355 04/29/14 0004 04/29/14 0620 04/30/14 0600 06/08/14 1201 06/16/14 1557  INR 1.08  --   --   --   --   --  1.32 1.03  APTT  --   < > 50* 86* 61* 50*  --   --   < > = values in this interval not displayed.  BMP:  Recent Labs  09/14/14 1154 09/15/14 0614 09/17/14 0255 10/09/14 0827  NA 138 137 137 138  K 3.7 3.6 4.7 4.0  CL 97* 95* 97* 99*  CO2 30 34* 32 31  GLUCOSE 124* 134* 115* 112*  BUN 16 14 17 16   CALCIUM 8.9 8.8* 8.4* 8.6*  CREATININE 0.67 0.63 0.54 0.83  GFRNONAA >60 >60 >60 >60  GFRAA >60 >60 >60 >60    LIVER FUNCTION TESTS:  Recent Labs  04/28/14 0349 06/08/14 1319 09/15/14 0614 10/09/14 0827  BILITOT 0.5 0.6 0.6 0.4  AST 16 21 16 16   ALT 13 13 12* 12*  ALKPHOS 55 72 62 56  PROT 6.0 8.0 6.3* 6.6  ALBUMIN 2.5* 3.7 2.9* 3.3*    TUMOR MARKERS: No results for input(s): AFPTM, CEA, CA199, CHROMGRNA in the last 8760 hours.  Assessment and Plan:  Recurrent ight sided pneumothorax.  Recent h/o pneumo with chest tube and resolution (7/2-09/17/2014)  Afib on Xarelto (last dose 10/08/2014 at 7 pm)  Will proceed with placement of chest tube/catheter today by Dr. Earleen Newport or associate.  Risks and Benefits discussed with the patient including bleeding, infection, and possible need for larger chest tube placement.   All of the patient's questions were answered, patient is agreeable to proceed. Consent signed and in chart.  Thank you for this interesting consult.  I greatly enjoyed meeting SONITA MICHIELS and look forward to participating in their care.  A copy of this report was sent to the requesting provider on this date.  Signed: Murrell Redden PA-C 10/09/2014, 11:35 AM   I spent a total of 20 Minutes  in face to face in clinical consultation, greater  than 50% of which was counseling/coordinating care for chest tube placement to treat pneumothorax.

## 2014-10-09 NOTE — ED Notes (Signed)
Patient transported to X-ray 

## 2014-10-09 NOTE — ED Notes (Signed)
Pt to department via EMS from home-pt reports she has been SOB last night and into this morning. States that she normally 2L all the time but was 90% on EMS arrival. Pt was diminished in al fields. Given a duo-neb in the truck. Bp-132/68 HR-80 O2-94% 20g left hand.

## 2014-10-10 ENCOUNTER — Encounter: Payer: Self-pay | Admitting: *Deleted

## 2014-10-10 ENCOUNTER — Inpatient Hospital Stay (HOSPITAL_COMMUNITY): Payer: Commercial Managed Care - HMO

## 2014-10-10 DIAGNOSIS — Z9689 Presence of other specified functional implants: Secondary | ICD-10-CM | POA: Insufficient documentation

## 2014-10-10 DIAGNOSIS — J441 Chronic obstructive pulmonary disease with (acute) exacerbation: Secondary | ICD-10-CM

## 2014-10-10 DIAGNOSIS — Z748 Other problems related to care provider dependency: Secondary | ICD-10-CM

## 2014-10-10 DIAGNOSIS — Z741 Need for assistance with personal care: Secondary | ICD-10-CM

## 2014-10-10 DIAGNOSIS — Z598 Other problems related to housing and economic circumstances: Secondary | ICD-10-CM

## 2014-10-10 MED ORDER — HYDROCODONE-ACETAMINOPHEN 5-325 MG PO TABS
1.0000 | ORAL_TABLET | Freq: Four times a day (QID) | ORAL | Status: DC | PRN
  Administered 2014-10-11: 1 via ORAL
  Filled 2014-10-10: qty 1

## 2014-10-10 MED ORDER — METOCLOPRAMIDE HCL 5 MG/ML IJ SOLN
10.0000 mg | Freq: Four times a day (QID) | INTRAMUSCULAR | Status: DC | PRN
  Filled 2014-10-10: qty 2

## 2014-10-10 MED ORDER — POTASSIUM CHLORIDE IN NACL 20-0.9 MEQ/L-% IV SOLN
INTRAVENOUS | Status: DC
  Administered 2014-10-10 – 2014-10-13 (×4): via INTRAVENOUS
  Filled 2014-10-10 (×6): qty 1000

## 2014-10-10 NOTE — Progress Notes (Signed)
Pt with right chest tube in. She has been stable without any sob noted. She remains on 2L O2 via nasal cannulla with O2 saturation in the upper 90s. No complaints of chest discomfort.

## 2014-10-10 NOTE — Patient Outreach (Signed)
[facility] Care Management  10/10/2014  [name] [name] [birth year] [id number]   Noted per EPIC, patient is inpatient at [facility]. Will continue to follow progress, and begin transition of care at discharge.  Joylene Draft, RN, [place] [facility] [phone]

## 2014-10-10 NOTE — Care Management Note (Signed)
Case Management Note  Patient Details  Name: [name] [name] MRN: [id number] Date of Birth: 1940/09/18  Subjective/Objective:       Patient lives at home alone.  Walks with walker.  Lives in [place], son and daughter in law live in [place].  Has home oxygen at home with [facility].  Plan for discharge home with CT.  Patient states she feels she will be able to do this without a problem, is agreeable to [facility] RN to visit.  Has had HH before with [facility].  Would like them to come back.  Her son will also be bringing her home but does not have her oxygen tanks so will need portable oxygen tank for discharge.  Called [facility] referral to [facility], will need order for [facility] RN and face to face prior to discharge.  Have called [name] for portable oxygen tank for discharge.              Action/Plan:   Expected Discharge Date:                  Expected Discharge Plan:  [place]  In-House Referral:     Discharge planning Services  CM Consult  Post Acute Care Choice:    Choice offered to:     DME Arranged:  Oxygen (Has oxygen with [name] already, but needs a tank for discharge. ) DME Agency:  [place]:  RN [facility] Agency:  [employer]  Status of Service:  In process, will continue to follow  Medicare Important Message Given:    Date Medicare IM Given:    Medicare IM give by:    Date Additional Medicare IM Given:    Additional Medicare Important Message give by:     If discussed at [place] of Stay Meetings, dates discussed:    Additional Comments:  Vergie Living, RN 10/10/2014, 4:17 PM

## 2014-10-10 NOTE — Progress Notes (Addendum)
      [address]       [place] [address]             [phone]      Subjective:  [name] complains of some nausea this morning.  The nurse states she did vomit and she recommended having a liquid diet, however the patient wants regular food.  Her pain is mostly under control, however she feels Tramadol was too strong and requests a different pain medication.  Objective: Vital signs in last 24 hours: Temp:  [97 F (36.1 C)-98.2 F (36.8 C)] 98.2 F (36.8 C) (07/28 0547) Pulse Rate:  [55-74] 70 (07/28 0547) Cardiac Rhythm:  [-] Normal sinus rhythm (07/27 2010) Resp:  [10-18] 10 (07/27 1835) BP: (90-178)/(31-71) 142/52 mmHg (07/28 0547) SpO2:  [93 %-100 %] 98 % (07/28 0547)  Intake/Output from previous day: 07/27 0701 - 07/28 0700 In: 0  Out: 956 [Urine:900; Chest Tube:56]  General appearance: alert, cooperative and no distress Heart: regular rate and rhythm Lungs: diminished breath sounds bilaterally Abdomen: soft, non-tender; bowel sounds normal; no masses,  no organomegaly Wound: clean and dry  Lab Results:  Recent Labs  10/09/14 0827  WBC 5.1  HGB 13.1  HCT 43.7  PLT 98*   BMET:  Recent Labs  10/09/14 0827  NA 138  K 4.0  CL 99*  CO2 31  GLUCOSE 112*  BUN 16  CREATININE 0.83  CALCIUM 8.6*    PT/INR: No results for input(s): LABPROT, INR in the last 72 hours. ABG    Component Value Date/Time   TCO2 39 12/28/2013 1041   CBG (last 3)  No results for input(s): GLUCAP in the last 72 hours.  Assessment/Plan:  1. Recurrent Right Pneumothorax- S/P IR placed pigtail- CXR with resolution of pneumothorax 2. Pulm- severe COPD, on oxygen continuous oxygen at home, + air leak leave chest tube on suction, continue home COPD medications 3. GI- nausea, zofran and reglan prn.... If continues to vomit will transition back to liquid diet 4. Dispo- patient stable, CT to suction, repeat CXR in AM   LOS: 1 day    [name],  [name] 10/10/2014   patient examined and medical record reviewed,agree with above note. [name] [name] 10/10/2014   Pulmonary status is stable after lacing of pigtail catheter by IR. Because patient is not a candidate for redo VATS and resection of blebs the pigtail catheter we left in place at home for approximately 2 weeks with mini express system. We'll convert to mini express tomorrow if lung does not drop with PA and lateral chest x-ray off suction  Patient will need home health nurse to take care of her chest tube as she lives alone.

## 2014-10-10 NOTE — Progress Notes (Signed)
Reason for visit: Follow up right chest tube placement  Subjective: Pt s/p (R)pigtail chest tube placement yesterday Feels ok Denies SOB Some pain near tube insertion site  Objective: Physical Exam: BP 156/60 mmHg  Pulse 76  Temp(Src) 98.2 F (36.8 C) (Oral)  Resp 10  SpO2 98% (R)chest tube intact, site clean/ I could not demonstrate air leak as noted earlier today    Labs: CBC  Recent Labs  10/09/14 0827  WBC 5.1  HGB 13.1  HCT 43.7  PLT 98*   BMET  Recent Labs  10/09/14 0827  NA 138  K 4.0  CL 99*  CO2 31  GLUCOSE 112*  BUN 16  CREATININE 0.83  CALCIUM 8.6*   LFT  Recent Labs  10/09/14 0827  PROT 6.6  ALBUMIN 3.3*  AST 16  ALT 12*  ALKPHOS 56  BILITOT 0.4     Studies/Results: Dg Chest 2 View  10/09/2014   CLINICAL DATA:  Mid chest pain shortness breath and cough since last night, history of COPD and asthma  EXAM: CHEST  2 VIEW  COMPARISON:  PA and lateral chest x-ray of September 30, 2014  FINDINGS: There is a 40% right mid to lower pneumothorax. No significant apical component is observed. There is no mediastinal shift. The left lung is well-expanded. The aerated portions of both lungs exhibit mild interstitial prominence. There is no alveolar infiltrate there is a tiny right pleural effusion. No parenchymal masses are observed. The heart is normal in size. The pulmonary vascularity is not engorged. There is old deformity of the right humeral head and neck. The thoracic vertebral bodies are preserved in height.  IMPRESSION: 1. There is approximately 40% right-sided pneumothorax. There is a trace right pleural effusion. There is underlying COPD and mild pulmonary fibrotic change and chronic right apical scarring. 2. There is no CHF. 3. These results were called by telephone at the time of interpretation on 10/09/2014 at 9:03 am to Dr. Winfred Leeds, who verbally acknowledged these results.   Electronically Signed   By: David  Martinique M.D.   On: 10/09/2014 09:04    Portable Chest 1 View  10/10/2014   CLINICAL DATA:  Reason pneumothorax on right.  EXAM: PORTABLE CHEST - 1 VIEW  COMPARISON:  October 09, 2014  FINDINGS: There is a chest catheter on the right inferiorly. There has been interval resolution of pneumothorax on the right. There is postoperative change in the right apex with scarring. There is no edema or consolidation. Heart size and pulmonary vascularity are normal. No adenopathy. There is evidence of old trauma involving the proximal right humerus with remodeling, stable.  IMPRESSION: Resolution of pneumothorax following right chest catheter placement. Stable scarring and postoperative change right apex. No edema or consolidation.   Electronically Signed   By: Lowella Grip III M.D.   On: 10/10/2014 08:07   Ir Image Guided Drainage By Percutaneous Catheter  10/09/2014   CLINICAL DATA:  74 year old female with recurrent right-sided pneumothorax. She has been referred for image guided chest tube placement.  EXAM: FLUOROSCOPIC GUIDED RIGHT CHEST TUBE PLACEMENT  FLUOROSCOPY TIME:  6 minutes 12 seconds  MEDICATIONS: 0.5 mg Versed, 12.5 mcg fentanyl  20 minutes sedation time  TECHNIQUE: The procedure, risks, benefits, and alternatives were explained to the patient and the patient's family. Questions regarding the procedure were encouraged and answered. The patient understands and consents to the procedure.  Initial image demonstrates pneumothorax on the right, with the air in the inferior chest.  The right  lateral chest, in the mid axillary line at the level of the nipple was prepped with Betadine in a sterile fashion, and a sterile drape was applied covering the operative field. A sterile gown and sterile gloves were used for the procedure. Local anesthesia was provided with 1% Lidocaine.  1% lidocaine was used to anesthetize the skin and subcutaneous tissues to the level of the pleural space, with air aspirated at the level the pleura. A small stab incision was  made with 11 blade scalpel, and the Yueh needle was advanced into the pleural space. The catheter was advanced off the needle when air was aspirated. An 035 wire was advanced into the pleural space, and then a 10 French pigtail catheter was advanced over the wire into the pleural space. Final position of the catheter was dependently within the gas at the inferior aspect of the chest cavity.  Final image was stored.  Patient tolerated the procedure well and remained hemodynamically stable throughout.  No complications were encountered and no significant blood loss was encounter  IMPRESSION: Status post fluoroscopic placement of right-sided thoracostomy tube.  Signed,  Dulcy Fanny. Earleen Newport, DO  [facility]  [facility]  PLAN: Thoracostomy tube will be placed to atrium drainage, with continuous aspiration overnight to 20 cm water pressure.   Electronically Signed   By: Corrie Mckusick D.O.   On: 10/09/2014 18:55    Assessment/Plan: (R)PTX s/p chest tube CXR looks better IR following with CTS    LOS: 1 day    Ascencion Dike PA-C 10/10/2014 3:46 PM

## 2014-10-11 ENCOUNTER — Inpatient Hospital Stay (HOSPITAL_COMMUNITY): Payer: Commercial Managed Care - HMO

## 2014-10-11 DIAGNOSIS — J9383 Other pneumothorax: Secondary | ICD-10-CM | POA: Insufficient documentation

## 2014-10-11 MED ORDER — METOCLOPRAMIDE HCL 5 MG/ML IJ SOLN
5.0000 mg | Freq: Three times a day (TID) | INTRAMUSCULAR | Status: AC
  Administered 2014-10-11 (×3): 5 mg via INTRAVENOUS
  Filled 2014-10-11 (×3): qty 1

## 2014-10-11 MED ORDER — ACETAMINOPHEN 325 MG PO TABS
650.0000 mg | ORAL_TABLET | ORAL | Status: DC | PRN
Start: 2014-10-11 — End: 2014-10-19
  Administered 2014-10-11 – 2014-10-19 (×17): 650 mg via ORAL
  Filled 2014-10-11 (×17): qty 2

## 2014-10-11 MED ORDER — ACETAMINOPHEN 650 MG RE SUPP: 650.0000 mg | RECTAL | Status: DC | PRN

## 2014-10-11 NOTE — Clinical Social Work Note (Signed)
Clinical Social Work Assessment  Patient Details  Name: [name] [name] MRN: [id number] Date of Birth: [birth year]  Date of referral:  10/11/14               Reason for consult:  Facility Placement                Permission sought to share information with:  Family Supports Permission granted to share information::  Yes, Verbal Permission Granted  Name::     [name] [name] patient's son  Agency::  SNF admissions  Relationship::     Contact Information:     Housing/Transportation Living arrangements for the past 2 months:  Single Family Home Source of Information:  Patient, Adult Children Patient Interpreter Needed:  None Criminal Activity/Legal Involvement Pertinent to Current Situation/Hospitalization:  No - Comment as needed Significant Relationships:  Adult Children Lives with:  Self Do you feel safe going back to the place where you live?  Yes Need for family participation in patient care:  Yes (Comment) (Paitient is requesting her son to help make the decisions)  Care giving concerns:  Patient did not have any care giving concerns, patient stated everyone is doing such a great job taking care of her.   Social Worker assessment / plan:  Patient is a 74 year old female who is alert and oriented x4.  Patient is very talkative and friendly, patient expressed that she would rather not go to a SNF for short term rehab, but her son stated she does not have anyone who can take care of her around the clock patient agreed to go to SNF for short term rehab.  Patient states she is hesitant about going to SNF because last time she went it was not a good experience.  [name] reassured that there are other facilities in the region and she does not have to go back to the same SNF she went to last time, [name] gave her a list of the other facilities.  Patient was explained the process of SNF placement and role of [name].  Patient stated she understood and asked appropriate questions.  Patient stated her son will be  available tomorrow and she will discusss with him which facilities she would like to go to.  [name] gave patient list of SNFs and also contacted patient's son per her request to speak to him about SNF placement.  Patient's son stated that she was at [facility] last time and they were pleased with the care that was provided.  [name] informed patient's son that the weekend social worker will present bed offers once SNFs have informed [name] of what is available.  Patient and her son stated they did have any other questions.  Patient agreed to go to SNF for short term rehab.     Employment status:  Retired Forensic scientist:  PT Recommendations:  [place] / Referral to community resources:  [place]  Patient/Family's Response to care:  Patient and son agreeable to going to SNF for short term rehab.  Patient/Family's Understanding of and Emotional Response to Diagnosis, Current Treatment, and Prognosis:  Patient and son are aware of response to patient's diagnosis and treatment plan.  Emotional Assessment Appearance:  Appears stated age Attitude/Demeanor/Rapport:    Affect (typically observed):  Pleasant, Appropriate, Calm Orientation:  Oriented to Self Alcohol / Substance use:  Not Applicable Psych involvement (Current and /or in the community):  No (Comment)  Discharge Needs  Concerns to be addressed:  Lack of  Support Readmission within the last 30 days:  No Current discharge risk:  Lack of support system, Lives alone Barriers to Discharge:  Insurance Authorization   Anell Barr 10/11/2014, 5:30 pm

## 2014-10-11 NOTE — Clinical Social Work Placement (Addendum)
   CLINICAL SOCIAL WORK PLACEMENT  NOTE  Date:  10/11/2014  Patient Details  Name: [name] [name] MRN: [id number] Date of Birth: 19-Sep-1940  Clinical Social Work is seeking post-discharge placement for this patient at the [place] level of care (*[name] will initial, date and re-position this form in  chart as items are completed):  Yes   Patient/family provided with [facility] Work Department's list of facilities offering this level of care within the geographic area requested by the patient (or if unable, by the patient's family).  Yes   Patient/family informed of their freedom to choose among providers that offer the needed level of care, that participate in Medicare, Medicaid or managed care program needed by the patient, have an available bed and are willing to accept the patient.  Yes   Patient/family informed of [facility]'s ownership interest in [facility] and [facility], as well as of the fact that they are under no obligation to receive care at these facilities.  PASRR submitted to EDS on 10/11/14     PASRR number received on 10/11/14     Existing PASRR number confirmed on       FL2 transmitted to all facilities in geographic area requested by pt/family on       FL2 transmitted to all facilities within larger geographic area on 10/11/14     Patient informed that his/her managed care company has contracts with or will negotiate with certain facilities, including the following:            Patient/family informed of bed offers received.  Patient chooses bed at       Physician recommends and patient chooses bed at      Patient to be transferred to   on  .  Patient to be transferred to facility by       Patient family notified on   of transfer.  Name of family member notified:        PHYSICIAN  Please sign FL2 on patient's chart     Additional Comment:    _______________________________________________ Ross Ludwig, [place] 10/11/2014, 10:25 PM

## 2014-10-11 NOTE — Evaluation (Signed)
Physical Therapy Evaluation Patient Details Name: [name] [name] MRN: [id number] DOB: [birth year] Today's Date: 10/11/2014   History of Present Illness  Patient is a 74 y/o female with hx of COPD, A-fib, HTN, CKD, HLD and pnemothorax on right presents with SOB, not improved with 02. CXR showed evidence of a recurrent 40% right sided pneumothorax s/p chest tube placement.  Clinical Impression  Patient presents with pain, generalized weakness, impaired endurance and dyspnea on exertion impacting mobility. Pt with drop in oxygen saturation during gait to 89% on 2L/min; able to improve saturation with cues for pursed lip breathing. Pt very unsteady on feet and not safe to return home alone esp with new chest tube and 02 lines putting pt at increased risk for falls. Recommend ST SNF to maximize independence and mobility prior to return home. Pt adamant about returning home. If pt can get 24/7 S, will be able to return home with support and HHPT.    Follow Up Recommendations SNF;Supervision/Assistance - 24 hour    Equipment Recommendations  None recommended by PT    Recommendations for Other Services       Precautions / Restrictions Precautions Precautions: Fall Precaution Comments: chest tube Restrictions Weight Bearing Restrictions: No      Mobility  Bed Mobility Overal bed mobility: Needs Assistance Bed Mobility: Sit to Supine       Sit to supine: Min guard;HOB elevated      Transfers Overall transfer level: Needs assistance Equipment used: Rolling walker (2 wheeled) Transfers: Sit to/from Stand Sit to Stand: Min guard         General transfer comment: Min guard for safety.   Ambulation/Gait Ambulation/Gait assistance: Min assist Ambulation Distance (Feet): 40 Feet Assistive device: Rolling walker (2 wheeled) Gait Pattern/deviations: Step-through pattern;Decreased stride length;Drifts right/left;Trunk flexed   Gait velocity interpretation: <1.8 ft/sec, indicative  of risk for recurrent falls General Gait Details: Pt with slow, unsteady gait with Min A for balance/safety. Dyspnea present. SA02 dropped to 89% on 2L 02 [place]. CUes for pursed lip breathing.  Stairs            Wheelchair Mobility    Modified Rankin (Stroke Patients Only)       Balance Overall balance assessment: Needs assistance Sitting-balance support: Feet supported;No upper extremity supported Sitting balance-Leahy Scale: Good     Standing balance support: During functional activity Standing balance-Leahy Scale: Poor Standing balance comment: Relient on RW for support.                              Pertinent Vitals/Pain Pain Assessment: Faces Faces Pain Scale: Hurts little more Pain Location: chest tube site Pain Descriptors / Indicators: Sore Pain Intervention(s): Limited activity within patient's tolerance;Monitored during session;Repositioned;Patient requesting pain meds-RN notified    Home Living Family/patient expects to be discharged to:: Skilled nursing facility Living Arrangements: Alone                    Prior Function Level of Independence: Independent with assistive device(s)         Comments: Pt reports no falls at home despite note from home care services stating pt with multiple fall history. Reports using rollator for ambulation. Does not drive. Family assists with grocery shopping.     Hand Dominance   Dominant Hand: Left    Extremity/Trunk Assessment   Upper Extremity Assessment: Defer to OT evaluation  Lower Extremity Assessment: Generalized weakness         Communication   Communication: HOH  Cognition Arousal/Alertness: Awake/alert Behavior During Therapy: WFL for tasks assessed/performed Overall Cognitive Status: No family/caregiver present to determine baseline cognitive functioning Area of Impairment: Safety/judgement         Safety/Judgement: Decreased awareness of safety;Decreased  awareness of deficits          General Comments General comments (skin integrity, edema, etc.): Lengthy discussion re: disposition, rehab.    Exercises        Assessment/Plan    PT Assessment Patient needs continued PT services  PT Diagnosis Difficulty walking;Generalized weakness;Acute pain   PT Problem List Decreased strength;Pain;Cardiopulmonary status limiting activity;Decreased cognition;Decreased activity tolerance;Decreased balance;Decreased mobility;Decreased safety awareness  PT Treatment Interventions Balance training;Gait training;Functional mobility training;Therapeutic activities;Therapeutic exercise;Patient/family education   PT Goals (Current goals can be found in the Care Plan section) Acute Rehab PT Goals Patient Stated Goal: to go home today PT Goal Formulation: With patient Time For Goal Achievement: 10/25/14 Potential to Achieve Goals: Good    Frequency Min 3X/week   Barriers to discharge Decreased caregiver support Pt lives alone    Co-evaluation               End of Session Equipment Utilized During Treatment: Gait belt;Oxygen;Other (comment) (chest tube) Activity Tolerance: Patient limited by pain;Patient limited by fatigue;Other (comment) (drop in Sa02.) Patient left: in bed;with call bell/phone within reach;with bed alarm set;with nursing/sitter in room Nurse Communication: Mobility status         Time: 0076-2263 PT Time Calculation (min) (ACUTE ONLY): 23 min   Charges:   PT Evaluation $Initial PT Evaluation Tier I: 1 Procedure PT Treatments $Gait Training: 8-22 mins   PT G Codes:        Cornelio Parkerson A Jestina Stephani 10/11/2014, 4:30 PM Wray Kearns, [place], DPT [phone]

## 2014-10-11 NOTE — Progress Notes (Addendum)
      [address]       [place] [address]             [phone]      Subjective:  Ms. Herber complains of vomiting after eating.  She states this continued most of the day yesterday.  She is requesting a liquid diet.  She had no issues with this prior to surgery.    Objective: Vital signs in last 24 hours: Temp:  [97.9 F (36.6 C)-98.8 F (37.1 C)] 98.8 F (37.1 C) (07/29 0510) Pulse Rate:  [54-76] 73 (07/29 0510) Cardiac Rhythm:  [-] Normal sinus rhythm (07/28 2100) Resp:  [16-18] 18 (07/29 0510) BP: (117-172)/(42-60) 138/42 mmHg (07/29 0510) SpO2:  [93 %-100 %] 100 % (07/29 0522)  Intake/Output from previous day: 07/28 0701 - 07/29 0700 In: 603 [P.O.:600; I.V.:3] Out: 950 [Urine:950]  General appearance: alert, cooperative and no distress Heart: regular rate and rhythm Lungs: clear to auscultation bilaterally Abdomen: soft, non-tender; bowel sounds normal; no masses,  no organomegaly Wound: clean and dry  Lab Results:  Recent Labs  10/09/14 0827  WBC 5.1  HGB 13.1  HCT 43.7  PLT 98*   BMET:  Recent Labs  10/09/14 0827  NA 138  K 4.0  CL 99*  CO2 31  GLUCOSE 112*  BUN 16  CREATININE 0.83  CALCIUM 8.6*    PT/INR: No results for input(s): LABPROT, INR in the last 72 hours. ABG    Component Value Date/Time   TCO2 39 12/28/2013 1041   CBG (last 3)  No results for input(s): GLUCAP in the last 72 hours.  Assessment/Plan:  1. Recurrent Right Pneumothorax- IR pigtail in place, CXR stable- will transition to Mini Express 2. Pulm- severe COPD, uses continuous oxygen at home, on home COPD medications 3. GI- nausea/vomiting after eating- on zofran/reglan prn- will place back on liquid diet 4. Dispo- convert chest tube to Mini Express per PVT, repeat CXR in AM if okay can possibly go home tomorrow   LOS: 2 days    Ahmed Prima, [name] 10/11/2014  Patient seen and examined, agree with above Nausea earlier, hydrocodone is most likely  suspect Will dc hydrocodone and use acetaminophen for pain CXR OK  Steven C. Roxan Hockey, MD [facility] [phone]

## 2014-10-11 NOTE — Discharge Summary (Signed)
Physician Discharge Summary  Patient ID: BERKLEY WRIGHTSMAN MRN: [id number] DOB/AGE: 73/30/1942 74 y.o.  Admit date: 10/09/2014 Discharge date: 10/19/2014  Admission Diagnoses:  Patient Active Problem List   Diagnosis Date Noted  . Pneumothorax   . Recurrent spontaneous pneumothorax   . Chest tube in place   . Pneumothorax on right 10/09/2014  . Pneumothorax on left 09/14/2014  . Gross hematuria   . Hematuria 06/10/2014  . Cystitis, acute hemorrhagic 06/08/2014  . Hematochezia 06/08/2014  . Acute pyelonephritis 04/28/2014  . COLD (chronic obstructive lung disease)   . Essential hypertension   . CAP (community acquired pneumonia) 04/27/2014  . SOB (shortness of breath) 04/27/2014  . Microcytic anemia 04/27/2014  . Hemorrhagic cystitis 08/03/2013  . Anemia due to acute blood loss 08/03/2013  . COPD exacerbation 07/08/2013  . COPD with acute exacerbation 07/08/2013  . Paroxysmal atrial fibrillation 02/03/2012  . UTI (lower urinary tract infection) 02/02/2012  . Thrombocytopenia 02/02/2012  . Fracture of right hip 01/29/2012  . Multiple rib fractures 01/29/2012  . Fall due to stumbling 01/29/2012  . CAD (coronary artery disease) 01/29/2012  . Hypertension 01/29/2012  . COPD (chronic obstructive pulmonary disease) 01/29/2012  . CKD (chronic kidney disease) 01/29/2012  . Osteoarthritis 01/29/2012  . Closed fracture of right distal radius 10/13/2011    Class: Acute   Discharge Diagnoses:   Patient Active Problem List   Diagnosis Date Noted  . Pneumothorax   . Recurrent spontaneous pneumothorax   . Chest tube in place   . Pneumothorax on right 10/09/2014  . Pneumothorax on left 09/14/2014  . Gross hematuria   . Hematuria 06/10/2014  . Cystitis, acute hemorrhagic 06/08/2014  . Hematochezia 06/08/2014  . Acute pyelonephritis 04/28/2014  . COLD (chronic obstructive lung disease)   . Essential hypertension   . CAP (community acquired pneumonia) 04/27/2014  . SOB (shortness  of breath) 04/27/2014  . Microcytic anemia 04/27/2014  . Hemorrhagic cystitis 08/03/2013  . Anemia due to acute blood loss 08/03/2013  . COPD exacerbation 07/08/2013  . COPD with acute exacerbation 07/08/2013  . Paroxysmal atrial fibrillation 02/03/2012  . UTI (lower urinary tract infection) 02/02/2012  . Thrombocytopenia 02/02/2012  . Fracture of right hip 01/29/2012  . Multiple rib fractures 01/29/2012  . Fall due to stumbling 01/29/2012  . CAD (coronary artery disease) 01/29/2012  . Hypertension 01/29/2012  . COPD (chronic obstructive pulmonary disease) 01/29/2012  . CKD (chronic kidney disease) 01/29/2012  . Osteoarthritis 01/29/2012  . Closed fracture of right distal radius 10/13/2011    Class: Acute   Discharged Condition: good  History of Present Illness:   is a 74 y.o. female who is well known to [facility]. She has a history of Atrial Fibrillation for which she takes Xarelto, HTN, GERD, and anemia. She has known history of advanced COPD on home oxygen. She underwent a right VATS procedure 10 years ago. That procedure consisted of bled resection and pleurodesis of the upper space. The patient has done well since that time. However, on 09/14/2014 she presented to the [facility] ED with complaints of upper respiratory infection symptoms, severe cough, and right sided chest pain with worsening shortness of breath. CXR at that time showed a 30% right sided pneumothorax. She was evaluated by [name] at that time who recommended placement of a right sided chest tube. This was performed with resolution of her pneumothorax. Her hospital course was uncomplicated and she was later discharged home on 09/17/2014 at which time her CXR was  free from pneumothorax, but did have some evidence of subcutaneous emphysema present. The patient did well after discharge and was seen in follow up at [place] on 09/30/2014. At that visit the patient had no complaints. Repeat CXR was obtained and was again  free from pneumothorax. However, the patient presents to [facility] ED today 10/09/2014. She states that she had been feeling more short of breath since last night. She uses oxygen at home and states it was not making much difference. She contacted EMS who transported patient to the hospital. Initial assessment showed sats of 90%, she was given a duoneb on transport and her sats improved to 94%. Upon assessment in the ED a CXR was obtained and showed evidence of a recurrent right sided pneumothorax, felt to be 30%.   Hospital Course:   The patient was admitted for chest tube placement.  IR consult was obtained and the patient underwent imaged guided placement of a percutaneous catheter.  She tolerated the procedure without difficulty.  Repeat CXR showed resolution of pneumothorax.  Her CXR remained stable and she was transitioned to a Mini Express.  She did suffer vomiting after eating.  She responded well to anti-emetics.  She is otherwise medically stable.  She has severe COPD and is oxygen dependent at home.  She developed a right basilar pneumothorax when placed to a mini express. She was put back to suction and right basilar pneumothorax improved. She has again been weaned off suction.  She is on a Mini Express with no air leak.  Her CXR is free from pneumothorax.  She is medically stable for discharge home today,  Consult: Interventional Radiology  Treatments: IR Imaged guided Chest tube placement  Disposition: 01-Home or Self Care   Discharge Medications:    Medication List    STOP taking these medications        Fish Oil 1000 MG Caps     levofloxacin 500 MG tablet  Commonly known as:  LEVAQUIN      TAKE these medications        VENTOLIN HFA 108 (90 BASE) MCG/ACT inhaler  Generic drug:  albuterol  Inhale 2 puffs into the lungs 4 (four) times daily as needed for wheezing or shortness of breath.     albuterol (2.5 MG/3ML) 0.083% nebulizer solution  Commonly known as:  PROVENTIL   Take 2.5 mg by nebulization every 6 (six) hours as needed for wheezing or shortness of breath.     atorvastatin 40 MG tablet  Commonly known as:  LIPITOR  Take 40 mg by mouth every morning.     budesonide-formoterol 160-4.5 MCG/ACT inhaler  Commonly known as:  SYMBICORT  Inhale 2 puffs into the lungs 2 (two) times daily.     digoxin 0.25 MG tablet  Commonly known as:  LANOXIN  Take 0.125 mg by mouth every morning.     diltiazem 240 MG 24 hr capsule  Commonly known as:  CARDIZEM CD  Take 240 mg by mouth every morning.     docusate sodium 100 MG capsule  Commonly known as:  COLACE  Take 100 mg by mouth daily as needed (constipation).     fluticasone 50 MCG/ACT nasal spray  Commonly known as:  FLONASE  Place 1 spray into both nostrils daily as needed for allergies or rhinitis.     furosemide 20 MG tablet  Commonly known as:  LASIX  Take 1 tablet (20 mg total) by mouth daily.     Iron 325 (65 FE)  MG Tabs  Take 325 mg by mouth daily.     iron polysaccharides 150 MG capsule  Commonly known as:  NIFEREX  Take 1 capsule (150 mg total) by mouth daily.     metoprolol succinate 50 MG 24 hr tablet  Commonly known as:  TOPROL-XL  Take 12.5 mg by mouth daily. Take with or immediately following a meal.     oxybutynin 5 MG 24 hr tablet  Commonly known as:  DITROPAN-XL  Take 10 mg by mouth at bedtime.     pantoprazole 40 MG tablet  Commonly known as:  PROTONIX  Take 1 tablet (40 mg total) by mouth daily.     rivaroxaban 20 MG Tabs tablet  Commonly known as:  XARELTO  Take 20 mg by mouth daily with supper.     tiotropium 18 MCG inhalation capsule  Commonly known as:  SPIRIVA  Place 1 capsule (18 mcg total) into inhaler and inhale daily.     traMADol 50 MG tablet  Commonly known as:  ULTRAM  Take 1 tablet (50 mg total) by mouth every 6 (six) hours as needed for moderate pain (pain).     vitamin B-12 500 MCG tablet  Commonly known as:  CYANOCOBALAMIN  Take 1,000 mcg by  mouth daily.            Discharge Instructions    AMB Referral to [facility]    Complete by:  As directed   Reason for consult:  Patient to discharge to skilled nursing facility - short term rehab; post hospital follow up needed.  Diagnoses of:  COPD/ Pneumonia  Expected date of contact:  1-3 days (reserved for hospital discharges)  Please have social worker follow up for current discharge plan for Skilled nursing facility.            Medication List    STOP taking these medications        Fish Oil 1000 MG Caps     levofloxacin 500 MG tablet  Commonly known as:  LEVAQUIN      TAKE these medications        VENTOLIN HFA 108 (90 BASE) MCG/ACT inhaler  Generic drug:  albuterol  Inhale 2 puffs into the lungs 4 (four) times daily as needed for wheezing or shortness of breath.     albuterol (2.5 MG/3ML) 0.083% nebulizer solution  Commonly known as:  PROVENTIL  Take 2.5 mg by nebulization every 6 (six) hours as needed for wheezing or shortness of breath.     atorvastatin 40 MG tablet  Commonly known as:  LIPITOR  Take 40 mg by mouth every morning.     budesonide-formoterol 160-4.5 MCG/ACT inhaler  Commonly known as:  SYMBICORT  Inhale 2 puffs into the lungs 2 (two) times daily.     digoxin 0.25 MG tablet  Commonly known as:  LANOXIN  Take 0.125 mg by mouth every morning.     diltiazem 240 MG 24 hr capsule  Commonly known as:  CARDIZEM CD  Take 240 mg by mouth every morning.     docusate sodium 100 MG capsule  Commonly known as:  COLACE  Take 100 mg by mouth daily as needed (constipation).     fluticasone 50 MCG/ACT nasal spray  Commonly known as:  FLONASE  Place 1 spray into both nostrils daily as needed for allergies or rhinitis.     furosemide 20 MG tablet  Commonly known as:  LASIX  Take 1 tablet (20 mg total)  by mouth daily.     Iron 325 (65 FE) MG Tabs  Take 325 mg by mouth daily.     iron polysaccharides 150 MG capsule  Commonly known as:   NIFEREX  Take 1 capsule (150 mg total) by mouth daily.     metoprolol succinate 50 MG 24 hr tablet  Commonly known as:  TOPROL-XL  Take 12.5 mg by mouth daily. Take with or immediately following a meal.     oxybutynin 5 MG 24 hr tablet  Commonly known as:  DITROPAN-XL  Take 10 mg by mouth at bedtime.     pantoprazole 40 MG tablet  Commonly known as:  PROTONIX  Take 1 tablet (40 mg total) by mouth daily.     rivaroxaban 20 MG Tabs tablet  Commonly known as:  XARELTO  Take 20 mg by mouth daily with supper.     tiotropium 18 MCG inhalation capsule  Commonly known as:  SPIRIVA  Place 1 capsule (18 mcg total) into inhaler and inhale daily.     traMADol 50 MG tablet  Commonly known as:  ULTRAM  Take 1 tablet (50 mg total) by mouth every 6 (six) hours as needed for moderate pain (pain).     vitamin B-12 500 MCG tablet  Commonly known as:  CYANOCOBALAMIN  Take 1,000 mcg by mouth daily.       Follow-up Information    Follow up with [facility] [place] On 10/21/2014.   Why:  Appointment is at 2:00   Contact information:   [place] [id number]       Follow up with Tora [facility] On 10/21/2014.   Why:  Appointment is at 1:30   Contact information:   [facility]       Signed: Ellwood Handler 10/19/2014, 11:48 AM

## 2014-10-11 NOTE — Consult Note (Signed)
   [facility] Inpatient Consult   10/11/2014  KWEEN BACORN 11/30/1940 [id number] Patient is currently active [prior to [place] with [facility] for chronic disease management services.  Patient has been engaged by a [employer].  Our community based plan of care has focused on disease management and community resource support.  Patient will receive a post discharge transition of care call and will be evaluated for monthly home visits for assessments and disease process education.  Patient states, "I was doing good and for no reason this lung just keep going out on me. But, my nurse, Maudie Mercury, came to see me and I was doing good." Made Inpatient Case Manager aware that [facility] following. Of note, [facility] Care Management services does not replace or interfere with any services that are arranged by inpatient case management or social work.  For additional questions or referrals please contact: Natividad Brood, RN BSN [facility] Liaison  [phone] business mobile phone

## 2014-10-11 NOTE — Care Management Note (Signed)
Case Management Note  Patient Details  Name: [name] [name] MRN: [id number] Date of Birth: 1940-04-25  Subjective/Objective:       Patient lives at home alone.  Walks with walker.  Lives in [place], son and daughter in law live in [place].  Has home oxygen at home with [facility].  Plan for discharge home with CT.  Patient states she feels she will be able to do this without a problem, is agreeable to [facility] RN to visit.  Has had HH before with [facility].  Would like them to come back.  Her son will also be bringing her home but does not have her oxygen tanks so will need portable oxygen tank for discharge.  Called [facility] referral to [facility], will need order for [facility] RN and face to face prior to discharge.  Have called [name] for portable oxygen tank for discharge.     10-11-14 [name] [name], RNBSN  Brother in room, lives in [place], not close to sister.  Again questioned about safety of going home vs and SNF while tube in.  Adament she is going home with HH.  Brother tried to reason with her also.  Patient remains adament - NO SNF.  Returned call from [facility] and they state that they have had this patient at home and she is a frequent faller, lives at home alone, ?? Compliance on meds, concerned with dc home with CT.  Will not accept patient on dc.   Updated brother and patient, patient still does not want to go to a facility.  Brother working with patient on finding someone to stay with patient while tube in or for her to go to someone else's house where she will have someone with her.  Will follow back up with them.          Action/Plan:  Expected Discharge Date:                  Expected Discharge Plan:  [place]  In-House Referral:     Discharge planning Services  CM Consult  Post Acute Care Choice:    Choice offered to:     DME Arranged:  Oxygen (Has oxygen with [name] already, but needs a tank for discharge. ) DME Agency:  [place]:  RN [facility] Agency:   [place]  Status of Service:  In process, will continue to follow  Medicare Important Message Given:    Date Medicare IM Given:    Medicare IM give by:    Date Additional Medicare IM Given:    Additional Medicare Important Message give by:     If discussed at [place] of Stay Meetings, dates discussed:    Additional Comments:  [name] Living, RN 10/11/2014, 10:51 AM

## 2014-10-11 NOTE — Clinical Social Work Note (Signed)
[name] received consult for SNF placement.  [name] spoke with patient about SNF placement options and gave her a list of different SNFs in area.  Patient states she does not want to go to a SNF, because she has been before, however she understands that she is not safe enough to return home yet.  Patient stated her son will be here tomorrow and she will discuss with him about which SNF to go to.  Patient's son stated she was at [facility] in the past, and he would like her to return there if possible.  Patient stated she has been at [facility], and she does not want to return there.  [name] will fax out patient's information for SNF placement and complete FL2.  Formal assessment and placement note to follow.  Jones Broom. [place], MSW, [place] 10/11/2014 5:05 PM

## 2014-10-11 NOTE — Care Management Note (Signed)
Case Management Note  Patient Details  Name: [name] [name] MRN: [id number] Date of Birth: 1940/03/20  Subjective/Objective:       Patient lives at home alone.  Walks with walker.  Lives in [place], son and daughter in law live in [place].  Has home oxygen at home with [facility].  Plan for discharge home with CT.  Patient states she feels she will be able to do this without a problem, is agreeable to [facility] RN to visit.  Has had HH before with [facility].  Would like them to come back.  Her son will also be bringing her home but does not have her oxygen tanks so will need portable oxygen tank for discharge.  Called [facility] referral to [facility], will need order for [facility] RN and face to face prior to discharge.  Have called [name] for portable oxygen tank for discharge.     10-11-14 Luz Lex, RNBSN  Brother in room, lives in [place], not close to sister.  Again questioned about safety of going home vs and SNF while tube in.  Adament she is going home with HH.  Brother tried to reason with her also.  Patient remains adament - NO SNF.  Returned call from [facility] and they state that they have had this patient at home and she is a frequent faller, lives at home alone, ?? Compliance on meds, concerned with dc home with CT.  Will not accept patient on dc.   Updated brother and patient, patient still does not want to go to a facility.  Brother working with patient on finding someone to stay with patient while tube in or for her to go to someone else's house where she will have someone with her.  Will follow back up with them.       2pm - patient now agreeable to go to SNF plans to talk with son tomorrow  - Agreeable to talk with SW about placement and start the process. SW consulted, PT/OT orders placed. PT/OT to see tonight.       Action/Plan:  Expected Discharge Date:                  Expected Discharge Plan:  [place]  In-House Referral:     Discharge planning Services  CM Consult  Post  Acute Care Choice:    Choice offered to:     DME Arranged:  Oxygen (Has oxygen with [name] already, but needs a tank for discharge. ) DME Agency:  [place]:  RN [facility] Agency:  [place]  Status of Service:  In process, will continue to follow  Medicare Important Message Given:    Date Medicare IM Given:    Medicare IM give by:    Date Additional Medicare IM Given:    Additional Medicare Important Message give by:     If discussed at [place] of Stay Meetings, dates discussed:    Additional Comments:  Vergie Living, RN 10/11/2014, 3:53 PM

## 2014-10-11 NOTE — Discharge Instructions (Signed)
Pneumothorax °A pneumothorax, commonly called a collapsed lung, is a condition in which air leaks from a lung and builds up in the space between the lung and the chest wall (pleural space). The air in a pneumothorax is trapped outside the lung and takes up space, preventing the lung from fully expanding. This is a condition that usually occurs suddenly. The buildup of air may be small or large. A small pneumothorax may go away on its own. When a pneumothorax is larger, it will often require medical treatment and hospitalization.  °CAUSES  °A pneumothorax can sometimes happen quickly with no apparent cause. People with underlying lung problems, particularly COPD or emphysema, are at higher risk of pneumothorax. However, pneumothorax can happen quickly even in people with no prior known lung problems. Trauma, surgery, medical procedures, or injury to the chest wall can also cause a pneumothorax. °SIGNS AND SYMPTOMS  °Sometimes a pneumothorax will have no symptoms. When symptoms are present, they can include: °· Chest pain. °· Shortness of breath. °· Increased rate of breathing. °· Bluish color to your lips or skin (cyanosis). °DIAGNOSIS  °Pneumothorax is usually diagnosed by a chest X-ray or chest CT scan. Your health care provider will also take a medical history and perform a physical exam to determine why you may have a pneumothorax. °TREATMENT  °A small pneumothorax may go away on its own without treatment. Extra oxygen can sometimes help a small pneumothorax go away more quickly. For a larger pneumothorax or a pneumothorax that is causing symptoms, a procedure is usually needed to drain the air. In some cases, the health care provider may drain the air using a needle. In other cases, a chest tube may be inserted into the pleural space. A chest tube is a small tube placed between the ribs and into the pleural space. This removes the extra air and allows the lung to expand back to its normal size. A large  pneumothorax will usually require a hospital stay. If there is ongoing air leakage into the pleural space, then the chest tube may need to remain in place for several days until the air leak has healed. In some cases, surgery may be needed.  °HOME CARE INSTRUCTIONS  °· Only take over-the-counter or prescription medicines as directed by your health care provider. °· If a cough or pain makes it difficult for you to sleep at night, try sleeping in a semi-upright position in a recliner or by using 2 or 3 pillows. °· Rest and limit activity as directed by your health care provider. °· If you had a chest tube and it was removed, ask your health care provider when it is okay to remove the dressing. Until your health care provider says you can remove the dressing, do not allow it to get wet. °· Do not smoke. Smoking is a risk factor for pneumothorax. °· Do not fly in an airplane or scuba dive until your health care provider says it is okay. °· Follow up with your health care provider as directed. °SEEK IMMEDIATE MEDICAL CARE IF:  °· You have increasing chest pain or shortness of breath. °· You have a cough that is not controlled with suppressants. °· You begin coughing up blood. °· You have pain that is getting worse or is not controlled with medicines. °· You cough up thick, discolored mucus (sputum) that is yellow to green in color. °· You have redness, increasing pain, or discharge at the site where a chest tube had been in place (if   your pneumothorax was treated with a chest tube).  The site where your chest tube was located opens up.  You feel air coming out of the site where the chest tube was placed.  You have a fever or persistent symptoms for more than 2-3 days.  You have a fever and your symptoms suddenly get worse. MAKE SURE YOU:   Understand these instructions.  Will watch your condition.  Will get help right away if you are not doing well or get worse. Document Released: 03/01/2005 Document  Revised: 12/20/2012 Document Reviewed: 09/28/2012 [facility] Patient Information 2015 [place], [place]. This information is not intended to replace advice given to you by your health care provider. Make sure you discuss any questions you have with your health care provider.  Home Health:  1. Please monitor chest tube site

## 2014-10-11 NOTE — Progress Notes (Signed)
Referring Physician(s): CTS-[name]  Chief Complaint: Recurrent right PTX s/p chest tube 7/27  Subjective: Patient states some shortness of breath and admits to tenderness at drain site.  Allergies: Review of patient's allergies indicates no known allergies.  Medications: Prior to Admission medications   Medication Sig Start Date End Date Taking? Authorizing Provider  albuterol (PROVENTIL) (2.5 MG/3ML) 0.083% nebulizer solution Take 2.5 mg by nebulization every 6 (six) hours as needed for wheezing or shortness of breath.    Yes Historical Provider, MD  atorvastatin (LIPITOR) 40 MG tablet Take 40 mg by mouth every morning.  07/02/13  Yes Historical Provider, MD  budesonide-formoterol (SYMBICORT) 160-4.5 MCG/ACT inhaler Inhale 2 puffs into the lungs 2 (two) times daily. 08/06/13  Yes Barton Dubois, MD  digoxin (LANOXIN) 0.25 MG tablet Take 0.125 mg by mouth every morning.    Yes Historical Provider, MD  diltiazem (CARDIZEM CD) 240 MG 24 hr capsule Take 240 mg by mouth every morning.  04/08/14  Yes Historical Provider, MD  docusate sodium (COLACE) 100 MG capsule Take 100 mg by mouth daily as needed (constipation).    Yes Historical Provider, MD  Ferrous Sulfate (IRON) 325 (65 FE) MG TABS Take 325 mg by mouth daily.   Yes Historical Provider, MD  fluticasone (FLONASE) 50 MCG/ACT nasal spray Place 1 spray into both nostrils daily as needed for allergies or rhinitis. 08/06/13  Yes Barton Dubois, MD  furosemide (LASIX) 20 MG tablet Take 1 tablet (20 mg total) by mouth daily. Patient taking differently: Take 40 mg by mouth daily.  06/13/14  Yes Theodis Blaze, MD  metoprolol succinate (TOPROL-XL) 50 MG 24 hr tablet Take 12.5 mg by mouth daily. Take with or immediately following a meal.   Yes Historical Provider, MD  Omega-3 Fatty Acids (FISH OIL) 1000 MG CAPS Take 1,000 mg by mouth daily.   Yes Historical Provider, MD  oxybutynin (DITROPAN-XL) 5 MG 24 hr tablet Take 10 mg by mouth at  bedtime.   Yes Historical Provider, MD  pantoprazole (PROTONIX) 40 MG tablet Take 1 tablet (40 mg total) by mouth daily. 06/17/99  Yes Delora Fuel, MD  rivaroxaban (XARELTO) 20 MG TABS tablet Take 20 mg by mouth daily with supper.   Yes Historical Provider, MD  tiotropium (SPIRIVA) 18 MCG inhalation capsule Place 1 capsule (18 mcg total) into inhaler and inhale daily. 08/06/13  Yes Barton Dubois, MD  traMADol (ULTRAM) 50 MG tablet Take 1 tablet (50 mg total) by mouth every 6 (six) hours as needed for moderate pain (pain). Patient taking differently: Take 50 mg by mouth at bedtime as needed for moderate pain (pain).  09/19/14  Yes Donielle Liston Alba, PA-C  VENTOLIN HFA 108 (90 BASE) MCG/ACT inhaler Inhale 2 puffs into the lungs 4 (four) times daily as needed for wheezing or shortness of breath.  06/07/14  Yes Historical Provider, MD  vitamin B-12 (CYANOCOBALAMIN) 500 MCG tablet Take 1,000 mcg by mouth daily.    Yes Historical Provider, MD  iron polysaccharides (NIFEREX) 150 MG capsule Take 1 capsule (150 mg total) by mouth daily. Patient not taking: Reported on 10/07/2014 04/29/14   Annita Brod, MD  levofloxacin (LEVAQUIN) 500 MG tablet Take 1 tablet (500 mg total) by mouth daily. For 5 days then stop. Patient not taking: Reported on 10/07/2014 09/19/14   Nani Skillern, PA-C    Vital Signs: BP 138/42 mmHg  Pulse 73  Temp(Src) 98.8 F (37.1 C) (Oral)  Resp 18  SpO2 97%  Physical Exam General: A&Ox3, NAD Right chest tube intact, no air leak seen-still on LWS  Imaging: Dg Chest 2 View  10/09/2014   CLINICAL DATA:  Mid chest pain shortness breath and cough since last night, history of COPD and asthma  EXAM: CHEST  2 VIEW  COMPARISON:  PA and lateral chest x-ray of September 30, 2014  FINDINGS: There is a 40% right mid to lower pneumothorax. No significant apical component is observed. There is no mediastinal shift. The left lung is well-expanded. The aerated portions of both lungs exhibit mild  interstitial prominence. There is no alveolar infiltrate there is a tiny right pleural effusion. No parenchymal masses are observed. The heart is normal in size. The pulmonary vascularity is not engorged. There is old deformity of the right humeral head and neck. The thoracic vertebral bodies are preserved in height.  IMPRESSION: 1. There is approximately 40% right-sided pneumothorax. There is a trace right pleural effusion. There is underlying COPD and mild pulmonary fibrotic change and chronic right apical scarring. 2. There is no CHF. 3. These results were called by telephone at the time of interpretation on 10/09/2014 at 9:03 am to Dr. Winfred Leeds, who verbally acknowledged these results.   Electronically Signed   By: David  Martinique M.D.   On: 10/09/2014 09:04   Dg Chest Port 1 View  10/11/2014   CLINICAL DATA:  Chest tube placement for pneumothorax  EXAM: PORTABLE CHEST - 1 VIEW  COMPARISON:  October 10, 2014  FINDINGS: The chest catheter remains at the right base. There is no demonstrable pneumothorax. There is postoperative change and scarring in the right apex. Elsewhere lungs are clear. Heart size and pulmonary vascularity are normal. No adenopathy. There is evidence of old trauma involving the proximal right humerus, stable.  IMPRESSION: No pneumothorax. Scarring and postoperative change right apex. No edema or consolidation. No appreciable change compared to most recent prior study.   Electronically Signed   By: Lowella Grip III M.D.   On: 10/11/2014 08:04   Portable Chest 1 View  10/10/2014   CLINICAL DATA:  Reason pneumothorax on right.  EXAM: PORTABLE CHEST - 1 VIEW  COMPARISON:  October 09, 2014  FINDINGS: There is a chest catheter on the right inferiorly. There has been interval resolution of pneumothorax on the right. There is postoperative change in the right apex with scarring. There is no edema or consolidation. Heart size and pulmonary vascularity are normal. No adenopathy. There is evidence  of old trauma involving the proximal right humerus with remodeling, stable.  IMPRESSION: Resolution of pneumothorax following right chest catheter placement. Stable scarring and postoperative change right apex. No edema or consolidation.   Electronically Signed   By: Lowella Grip III M.D.   On: 10/10/2014 08:07   Ir Image Guided Drainage By Percutaneous Catheter  10/09/2014   CLINICAL DATA:  74 year old female with recurrent right-sided pneumothorax. She has been referred for image guided chest tube placement.  EXAM: FLUOROSCOPIC GUIDED RIGHT CHEST TUBE PLACEMENT  FLUOROSCOPY TIME:  6 minutes 12 seconds  MEDICATIONS: 0.5 mg Versed, 12.5 mcg fentanyl  20 minutes sedation time  TECHNIQUE: The procedure, risks, benefits, and alternatives were explained to the patient and the patient's family. Questions regarding the procedure were encouraged and answered. The patient understands and consents to the procedure.  Initial image demonstrates pneumothorax on the right, with the air in the inferior chest.  The right lateral chest, in the mid axillary line at the level of  the nipple was prepped with Betadine in a sterile fashion, and a sterile drape was applied covering the operative field. A sterile gown and sterile gloves were used for the procedure. Local anesthesia was provided with 1% Lidocaine.  1% lidocaine was used to anesthetize the skin and subcutaneous tissues to the level of the pleural space, with air aspirated at the level the pleura. A small stab incision was made with 11 blade scalpel, and the Yueh needle was advanced into the pleural space. The catheter was advanced off the needle when air was aspirated. An 035 wire was advanced into the pleural space, and then a 10 French pigtail catheter was advanced over the wire into the pleural space. Final position of the catheter was dependently within the gas at the inferior aspect of the chest cavity.  Final image was stored.  Patient tolerated the procedure  well and remained hemodynamically stable throughout.  No complications were encountered and no significant blood loss was encounter  IMPRESSION: Status post fluoroscopic placement of right-sided thoracostomy tube.  Signed,  Dulcy Fanny. Earleen Newport, DO  [facility]  [facility]  PLAN: Thoracostomy tube will be placed to atrium drainage, with continuous aspiration overnight to 20 cm water pressure.   Electronically Signed   By: Corrie Mckusick D.O.   On: 10/09/2014 18:55    Labs:  CBC:  Recent Labs  09/14/14 1154 09/15/14 0614 09/17/14 0255 10/09/14 0827  WBC 5.8 14.3* 10.4 5.1  HGB 12.8 11.8* 11.6* 13.1  HCT 41.0 38.7 36.9 43.7  PLT 83* 92* 104* 98*    COAGS:  Recent Labs  10/14/13 1410  04/28/14 1355 04/29/14 0004 04/29/14 0620 04/30/14 0600 06/08/14 1201 06/16/14 1557  INR 1.08  --   --   --   --   --  1.32 1.03  APTT  --   < > 50* 86* 61* 50*  --   --   < > = values in this interval not displayed.  BMP:  Recent Labs  09/14/14 1154 09/15/14 0614 09/17/14 0255 10/09/14 0827  NA 138 137 137 138  K 3.7 3.6 4.7 4.0  CL 97* 95* 97* 99*  CO2 30 34* 32 31  GLUCOSE 124* 134* 115* 112*  BUN 16 14 17 16   CALCIUM 8.9 8.8* 8.4* 8.6*  CREATININE 0.67 0.63 0.54 0.83  GFRNONAA >60 >60 >60 >60  GFRAA >60 >60 >60 >60    LIVER FUNCTION TESTS:  Recent Labs  04/28/14 0349 06/08/14 1319 09/15/14 0614 10/09/14 0827  BILITOT 0.5 0.6 0.6 0.4  AST 16 21 16 16   ALT 13 13 12* 12*  ALKPHOS 55 72 62 56  PROT 6.0 8.0 6.3* 6.6  ALBUMIN 2.5* 3.7 2.9* 3.3*    Assessment and Plan: Recurrent right PTX s/p IR pigtail catheter placed 7/27 CXR without PTX, no air leak Plans per CTS-transitioning to mini express, CXR in am and possibly home with catheter.   SignedHedy Jacob 10/11/2014, 12:04 PM   I spent a total of 15 Minutes in face to face in clinical consultation/evaluation, greater than 50% of which was counseling/coordinating  care for recurrent right PTX

## 2014-10-12 ENCOUNTER — Inpatient Hospital Stay (HOSPITAL_COMMUNITY): Payer: Commercial Managed Care - HMO

## 2014-10-12 MED ORDER — TRAMADOL HCL 50 MG PO TABS
50.0000 mg | ORAL_TABLET | Freq: Four times a day (QID) | ORAL | Status: DC
  Administered 2014-10-12 – 2014-10-19 (×29): 50 mg via ORAL
  Filled 2014-10-12 (×29): qty 1

## 2014-10-12 NOTE — Progress Notes (Signed)
Patient Mini Express Chest tube is hook up to wall suction. Was told in report it was to be to suction. R.N. aware and Dr. Roxan Hockey Aware and will D.C. Suction in A.M per Dr. Roxan Hockey orders

## 2014-10-12 NOTE — Progress Notes (Signed)
Per Dr. Chrystine Oiler please leave the mini express chest tube to suction for the night inorder for the right lung to re-inflate. A chest x-ray will be taken in the morning and assessed at that time.

## 2014-10-12 NOTE — Progress Notes (Signed)
Patient ID: [name] [name], female   DOB: 1940/10/13, 74 y.o.   MRN: [id number]    Referring Physician(s): Prescott Gum  Chief Complaint:  Recurrent right pneumothorax  Subjective:  Pt still with pain at right chest tube site, occ cough/nausea  Allergies: Review of patient's allergies indicates no known allergies.  Medications: Prior to Admission medications   Medication Sig Start Date End Date Taking? Authorizing Provider  albuterol (PROVENTIL) (2.5 MG/3ML) 0.083% nebulizer solution Take 2.5 mg by nebulization every 6 (six) hours as needed for wheezing or shortness of breath.    Yes Historical Provider, MD  atorvastatin (LIPITOR) 40 MG tablet Take 40 mg by mouth every morning.  07/02/13  Yes Historical Provider, MD  budesonide-formoterol (SYMBICORT) 160-4.5 MCG/ACT inhaler Inhale 2 puffs into the lungs 2 (two) times daily. 08/06/13  Yes Barton Dubois, MD  digoxin (LANOXIN) 0.25 MG tablet Take 0.125 mg by mouth every morning.    Yes Historical Provider, MD  diltiazem (CARDIZEM CD) 240 MG 24 hr capsule Take 240 mg by mouth every morning.  04/08/14  Yes Historical Provider, MD  docusate sodium (COLACE) 100 MG capsule Take 100 mg by mouth daily as needed (constipation).    Yes Historical Provider, MD  Ferrous Sulfate (IRON) 325 (65 FE) MG TABS Take 325 mg by mouth daily.   Yes Historical Provider, MD  fluticasone (FLONASE) 50 MCG/ACT nasal spray Place 1 spray into both nostrils daily as needed for allergies or rhinitis. 08/06/13  Yes Barton Dubois, MD  furosemide (LASIX) 20 MG tablet Take 1 tablet (20 mg total) by mouth daily. Patient taking differently: Take 40 mg by mouth daily.  06/13/14  Yes Theodis Blaze, MD  metoprolol succinate (TOPROL-XL) 50 MG 24 hr tablet Take 12.5 mg by mouth daily. Take with or immediately following a meal.   Yes Historical Provider, MD  Omega-3 Fatty Acids (FISH OIL) 1000 MG CAPS Take 1,000 mg by mouth daily.   Yes Historical Provider, MD  oxybutynin (DITROPAN-XL) 5  MG 24 hr tablet Take 10 mg by mouth at bedtime.   Yes Historical Provider, MD  pantoprazole (PROTONIX) 40 MG tablet Take 1 tablet (40 mg total) by mouth daily. 07/19/19  Yes Delora Fuel, MD  rivaroxaban (XARELTO) 20 MG TABS tablet Take 20 mg by mouth daily with supper.   Yes Historical Provider, MD  tiotropium (SPIRIVA) 18 MCG inhalation capsule Place 1 capsule (18 mcg total) into inhaler and inhale daily. 08/06/13  Yes Barton Dubois, MD  traMADol (ULTRAM) 50 MG tablet Take 1 tablet (50 mg total) by mouth every 6 (six) hours as needed for moderate pain (pain). Patient taking differently: Take 50 mg by mouth at bedtime as needed for moderate pain (pain).  09/19/14  Yes Donielle Liston Alba, PA-C  VENTOLIN HFA 108 (90 BASE) MCG/ACT inhaler Inhale 2 puffs into the lungs 4 (four) times daily as needed for wheezing or shortness of breath.  06/07/14  Yes Historical Provider, MD  vitamin B-12 (CYANOCOBALAMIN) 500 MCG tablet Take 1,000 mcg by mouth daily.    Yes Historical Provider, MD  iron polysaccharides (NIFEREX) 150 MG capsule Take 1 capsule (150 mg total) by mouth daily. Patient not taking: Reported on 10/07/2014 04/29/14   Annita Brod, MD  levofloxacin (LEVAQUIN) 500 MG tablet Take 1 tablet (500 mg total) by mouth daily. For 5 days then stop. Patient not taking: Reported on 10/07/2014 09/19/14   Nani Skillern, PA-C     Vital Signs: BP 161/48 mmHg  Pulse 92  Temp(Src) 97.6 F (36.4 C) (Oral)  Resp 20  Ht 5\' 5"  (1.651 m)  Wt 119 lb 3.2 oz (54.069 kg)  BMI 19.84 kg/m2  SpO2 100%  Physical Exam pt awake/alert; rt chest tube intact, attached to mini express system and back to wall suction; all connections checked; 150 cc's amber fluid noted  Imaging: Dg Chest 2 View  10/09/2014   CLINICAL DATA:  Mid chest pain shortness breath and cough since last night, history of COPD and asthma  EXAM: CHEST  2 VIEW  COMPARISON:  PA and lateral chest x-ray of September 30, 2014  FINDINGS: There is a 40% right  mid to lower pneumothorax. No significant apical component is observed. There is no mediastinal shift. The left lung is well-expanded. The aerated portions of both lungs exhibit mild interstitial prominence. There is no alveolar infiltrate there is a tiny right pleural effusion. No parenchymal masses are observed. The heart is normal in size. The pulmonary vascularity is not engorged. There is old deformity of the right humeral head and neck. The thoracic vertebral bodies are preserved in height.  IMPRESSION: 1. There is approximately 40% right-sided pneumothorax. There is a trace right pleural effusion. There is underlying COPD and mild pulmonary fibrotic change and chronic right apical scarring. 2. There is no CHF. 3. These results were called by telephone at the time of interpretation on 10/09/2014 at 9:03 am to Dr. Winfred Leeds, who verbally acknowledged these results.   Electronically Signed   By: David  Martinique M.D.   On: 10/09/2014 09:04   Dg Chest Port 1 View  10/12/2014   CLINICAL DATA:  RIGHT pneumothorax.  EXAM: PORTABLE CHEST - 1 VIEW  COMPARISON:  Radiographs 10/11/2014  FINDINGS: Normal cardiac silhouette. RIGHT pigtail catheter the RIGHT lung base. There is interval increase in the subpulmonic pneumothorax at the RIGHT lung base. Pneumothorax occupies approximately 15% of lung volume. No mediastinal shift. Bilateral upper lobe pulmonary scarring.  IMPRESSION: Increased subpulmonic pneumothorax at the RIGHT lung base. A small bore chest tube in place.  These results will be called to the ordering clinician or representative by the Radiologist Assistant, and communication documented in the PACS or zVision Dashboard.   Electronically Signed   By: Suzy Bouchard M.D.   On: 10/12/2014 09:32   Dg Chest Port 1 View  10/11/2014   CLINICAL DATA:  Chest tube placement for pneumothorax  EXAM: PORTABLE CHEST - 1 VIEW  COMPARISON:  October 10, 2014  FINDINGS: The chest catheter remains at the right base. There is  no demonstrable pneumothorax. There is postoperative change and scarring in the right apex. Elsewhere lungs are clear. Heart size and pulmonary vascularity are normal. No adenopathy. There is evidence of old trauma involving the proximal right humerus, stable.  IMPRESSION: No pneumothorax. Scarring and postoperative change right apex. No edema or consolidation. No appreciable change compared to most recent prior study.   Electronically Signed   By: Lowella Grip III M.D.   On: 10/11/2014 08:04   Portable Chest 1 View  10/10/2014   CLINICAL DATA:  Reason pneumothorax on right.  EXAM: PORTABLE CHEST - 1 VIEW  COMPARISON:  October 09, 2014  FINDINGS: There is a chest catheter on the right inferiorly. There has been interval resolution of pneumothorax on the right. There is postoperative change in the right apex with scarring. There is no edema or consolidation. Heart size and pulmonary vascularity are normal. No adenopathy. There is evidence of old trauma involving  the proximal right humerus with remodeling, stable.  IMPRESSION: Resolution of pneumothorax following right chest catheter placement. Stable scarring and postoperative change right apex. No edema or consolidation.   Electronically Signed   By: Lowella Grip III M.D.   On: 10/10/2014 08:07   Ir Image Guided Drainage By Percutaneous Catheter  10/09/2014   CLINICAL DATA:  74 year old female with recurrent right-sided pneumothorax. She has been referred for image guided chest tube placement.  EXAM: FLUOROSCOPIC GUIDED RIGHT CHEST TUBE PLACEMENT  FLUOROSCOPY TIME:  6 minutes 12 seconds  MEDICATIONS: 0.5 mg Versed, 12.5 mcg fentanyl  20 minutes sedation time  TECHNIQUE: The procedure, risks, benefits, and alternatives were explained to the patient and the patient's family. Questions regarding the procedure were encouraged and answered. The patient understands and consents to the procedure.  Initial image demonstrates pneumothorax on the right, with the  air in the inferior chest.  The right lateral chest, in the mid axillary line at the level of the nipple was prepped with Betadine in a sterile fashion, and a sterile drape was applied covering the operative field. A sterile gown and sterile gloves were used for the procedure. Local anesthesia was provided with 1% Lidocaine.  1% lidocaine was used to anesthetize the skin and subcutaneous tissues to the level of the pleural space, with air aspirated at the level the pleura. A small stab incision was made with 11 blade scalpel, and the Yueh needle was advanced into the pleural space. The catheter was advanced off the needle when air was aspirated. An 035 wire was advanced into the pleural space, and then a 10 French pigtail catheter was advanced over the wire into the pleural space. Final position of the catheter was dependently within the gas at the inferior aspect of the chest cavity.  Final image was stored.  Patient tolerated the procedure well and remained hemodynamically stable throughout.  No complications were encountered and no significant blood loss was encounter  IMPRESSION: Status post fluoroscopic placement of right-sided thoracostomy tube.  Signed,  Dulcy Fanny. Earleen Newport, DO  [facility]  [facility]  PLAN: Thoracostomy tube will be placed to atrium drainage, with continuous aspiration overnight to 20 cm water pressure.   Electronically Signed   By: Corrie Mckusick D.O.   On: 10/09/2014 18:55    Labs:  CBC:  Recent Labs  09/14/14 1154 09/15/14 0614 09/17/14 0255 10/09/14 0827  WBC 5.8 14.3* 10.4 5.1  HGB 12.8 11.8* 11.6* 13.1  HCT 41.0 38.7 36.9 43.7  PLT 83* 92* 104* 98*    COAGS:  Recent Labs  10/14/13 1410  04/28/14 1355 04/29/14 0004 04/29/14 0620 04/30/14 0600 06/08/14 1201 06/16/14 1557  INR 1.08  --   --   --   --   --  1.32 1.03  APTT  --   < > 50* 86* 61* 50*  --   --   < > = values in this interval not  displayed.  BMP:  Recent Labs  09/14/14 1154 09/15/14 0614 09/17/14 0255 10/09/14 0827  NA 138 137 137 138  K 3.7 3.6 4.7 4.0  CL 97* 95* 97* 99*  CO2 30 34* 32 31  GLUCOSE 124* 134* 115* 112*  BUN 16 14 17 16   CALCIUM 8.9 8.8* 8.4* 8.6*  CREATININE 0.67 0.63 0.54 0.83  GFRNONAA >60 >60 >60 >60  GFRAA >60 >60 >60 >60    LIVER FUNCTION TESTS:  Recent Labs  04/28/14 0349 06/08/14 1319 09/15/14  2081 10/09/14 0827  BILITOT 0.5 0.6 0.6 0.4  AST 16 21 16 16   ALT 13 13 12* 12*  ALKPHOS 55 72 62 56  PROT 6.0 8.0 6.3* 6.6  ALBUMIN 2.5* 3.7 2.9* 3.3*    Assessment and Plan: Recurrent right PTX ,s/p IR pigtail catheter placed 7/27; CXR today with increased size of rt basilar ptx- placed back to wall suction; cont to monitor CXR; pain meds/additional plans as  per TCTS   Signed: D. Rowe Robert 10/12/2014, 1:08 PM   I spent a total of 15 minutes in face to face in clinical consultation/evaluation, greater than 50% of which was counseling/coordinating care for right chest tube placement for pneumothorax

## 2014-10-12 NOTE — Progress Notes (Signed)
Suction to mini express chest tube d/c'd per Dr Hendrickson's orders with [facility] LPN.  Will cont to monitor pt closely.  Claudette Stapler, RN

## 2014-10-12 NOTE — Progress Notes (Addendum)
      [address]       [place] [address]             [phone]            Subjective: Patients states still with nausea. Would like to try soup. Has had a bowel movement.  Objective: Vital signs in last 24 hours: Temp:  [97.6 F (36.4 C)-99.1 F (37.3 C)] 97.6 F (36.4 C) (07/30 0510) Pulse Rate:  [72-92] 92 (07/30 0510) Cardiac Rhythm:  [-] Normal sinus rhythm (07/29 2030) Resp:  [18-20] 20 (07/30 0510) BP: (118-165)/(39-63) 161/48 mmHg (07/30 0512) SpO2:  [94 %-100 %] 100 % (07/30 0515) Weight:  [119 lb 3.2 oz (54.069 kg)-149 lb 14.4 oz (67.994 kg)] 119 lb 3.2 oz (54.069 kg) (07/30 0510)      Intake/Output from previous day: 07/29 0701 - 07/30 0700 In: 1440 [P.O.:840; I.V.:600] Out: 2460 [Urine:2350; Chest Tube:110]   Physical Exam:  Cardiovascular: RRR Pulmonary: Diminished right base; no rales, wheezes, or rhonchi. Abdomen: Soft, non tender, bowel sounds present. Extremities: Mild bilateral lower extremity edema. Wounds: Clean and dry.  No erythema or signs of infection. Chest Tube: to mini express, with air leak  Lab Results: CBC: Recent Labs  10/09/14 0827  WBC 5.1  HGB 13.1  HCT 43.7  PLT 98*   BMET:  Recent Labs  10/09/14 0827  NA 138  K 4.0  CL 99*  CO2 31  GLUCOSE 112*  BUN 16  CREATININE 0.83  CALCIUM 8.6*    PT/INR: No results for input(s): LABPROT, INR in the last 72 hours. ABG:  INR: Will add last result for INR, ABG once components are confirmed Will add last 4 CBG results once components are confirmed  Assessment/Plan:  1. CV - SR in the 80's. On Toprol XL 12.5 mg daily, Digoxin 0.25 mg, and Cardizem CD 240 mg daily. 2.  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 On 2 liters via [place]. Chest tube placed to mini express. There is an air leak. CXR appears to show a  right basilar pneumothorax. Check CXR in am 3. GI-has had nausea and emesis previously. On full liquids this am. Still with  nausea. Hydrocodone stopped yesterday. Given Reglan. Zofran PRN.Monitor  ZIMMERMAN,DONIELLE MPA-C 10/12/2014,8:17 AM  Still having nausea. Has had UTIs in the past- will check UA Increased pneumo with CT to mini-xpress- will place back to suction  Remo Lipps C. Roxan Hockey, MD [facility] [phone]

## 2014-10-13 ENCOUNTER — Inpatient Hospital Stay (HOSPITAL_COMMUNITY): Payer: Commercial Managed Care - HMO

## 2014-10-13 LAB — URINE MICROSCOPIC-ADD ON

## 2014-10-13 LAB — URINALYSIS, ROUTINE W REFLEX MICROSCOPIC
Bilirubin Urine: NEGATIVE
Glucose, UA: NEGATIVE mg/dL
Ketones, ur: NEGATIVE mg/dL
Leukocytes, UA: NEGATIVE
Nitrite: NEGATIVE
Protein, ur: NEGATIVE mg/dL
Specific Gravity, Urine: 1.009 (ref 1.005–1.030)
Urobilinogen, UA: 1 mg/dL (ref 0.0–1.0)
pH: 7 (ref 5.0–8.0)

## 2014-10-13 LAB — DIGOXIN LEVEL: Digoxin Level: 1.3 ng/mL (ref 0.8–2.0)

## 2014-10-13 MED ORDER — BENZONATATE 100 MG PO CAPS
100.0000 mg | ORAL_CAPSULE | Freq: Three times a day (TID) | ORAL | Status: AC
  Administered 2014-10-13 – 2014-10-17 (×15): 100 mg via ORAL
  Filled 2014-10-13 (×15): qty 1

## 2014-10-13 MED ORDER — DIGOXIN 125 MCG PO TABS
0.1250 mg | ORAL_TABLET | Freq: Every day | ORAL | Status: DC
  Administered 2014-10-15 – 2014-10-19 (×5): 0.125 mg via ORAL
  Filled 2014-10-13 (×5): qty 1

## 2014-10-13 MED ORDER — GUAIFENESIN ER 600 MG PO TB12
600.0000 mg | ORAL_TABLET | Freq: Two times a day (BID) | ORAL | Status: DC
  Administered 2014-10-13 – 2014-10-19 (×13): 600 mg via ORAL
  Filled 2014-10-13 (×14): qty 1

## 2014-10-13 NOTE — Progress Notes (Addendum)
      [address]       [place] [address]             [phone]            Subjective: Patient has complaints of "hurting all over" and "tired of coughing". She denies nausea or vomiting this am.  Objective: Vital signs in last 24 hours: Temp:  [97.5 F (36.4 C)-98.6 F (37 C)] 97.5 F (36.4 C) (07/31 0350) Pulse Rate:  [69-81] 69 (07/31 0350) Cardiac Rhythm:  [-] Normal sinus rhythm (07/30 1915) Resp:  [18-20] 18 (07/31 0350) BP: (139-165)/(44-58) 139/44 mmHg (07/31 0350) SpO2:  [98 %-100 %] 98 % (07/31 0350) Weight:  [120 lb 8 oz (54.658 kg)] 120 lb 8 oz (54.658 kg) (07/31 0350)     Intake/Output from previous day: 07/30 0701 - 07/31 0700 In: 240 [P.O.:240] Out: 1300 [Urine:1100; Chest Tube:200]   Physical Exam:  Cardiovascular: RRR Pulmonary: Diminished breath sounds throughout Abdomen: Soft, some diffuse tenderness, bowel sounds present.. Wounds: Clean and dry.  No erythema or signs of infection. Chest Tube: to mini express and to suction, with air leak  Lab Results: CBC:No results for input(s): WBC, HGB, HCT, PLT in the last 72 hours. BMET: No results for input(s): NA, K, CL, CO2, GLUCOSE, BUN, CREATININE, CALCIUM in the last 72 hours.  PT/INR: No results for input(s): LABPROT, INR in the last 72 hours. ABG:  INR: Will add last result for INR, ABG once components are confirmed Will add last 4 CBG results once components are confirmed  Assessment/Plan:  1. CV - SR in the 80's. On Toprol XL 12.5 mg daily, Digoxin 0.25 mg, and Cardizem CD 240 mg daily. 2.  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 On 4 liters via [place]. Chest tube placed back to suction as had right basilar pneumothorax when placed on mini express to water seal. There is an air leak. CXR appears to show right basilar pneumothorax, no worse than yesterday. Check CXR in am. Mucinex for cough. Continue Spiriva and Symbicort. 3. GI-has had nausea and emesis  previously. On full liquids this am.  Hydrocodone stopped. Given Reglan. Zofran PRN. Per patient request, advance to soft diet at lunch. 4. History of UTI's. Await UA  ZIMMERMAN,DONIELLE MPA-C 10/13/2014,7:52 AM   Headache and nausea better. C/o pain form pigtail and cough Will order tessalon Keep pigtail to suction via minixpress CXR shows a slight decrease in the right basilar pneumo  Remo Lipps C. Roxan Hockey, MD [facility] [phone]

## 2014-10-13 NOTE — Progress Notes (Signed)
[name] met with patient earlier today to discuss bed offers for short term SNF. She stated that she would prefer to return home with her son [name] and home health. Patient gave permission for [name] to contact her son to make final arrangements and [name] spoke to [place] on the phone.  He stated that he does want his mother to return home but AFTER she receives SNF rehab and her chest tube is removed. He states that he and his wife both work and they cannot stay with patient during the day.  [name] explained patient's desire to return home from the hospital and he stated that he would talk to her tonight. SNF bed offers discussed- current bed offer from [facility]. Son prefers [place] area if possible and is ok with [facility].  [name] should contact son tomorrow to provide any other choices in the [place] area.  Plan d//c to SNF when medically stable per MD.  Butch Penny T. Pauline Good, [place] (weekend coverage)

## 2014-10-13 NOTE — Progress Notes (Signed)
Patient ID: [name] [name], female   DOB: Aug 05, 1940, 74 y.o.   MRN: [id number]    Referring Physician(s): Prescott Gum  Chief Complaint: Recurrent right pneumothorax   Subjective: Pt still with some soreness at rt chest tube site; eager to get out of hospital; otherwise no sig changes   Allergies: Review of patient's allergies indicates no known allergies.  Medications: Prior to Admission medications   Medication Sig Start Date End Date Taking? Authorizing Provider  albuterol (PROVENTIL) (2.5 MG/3ML) 0.083% nebulizer solution Take 2.5 mg by nebulization every 6 (six) hours as needed for wheezing or shortness of breath.    Yes Historical Provider, MD  atorvastatin (LIPITOR) 40 MG tablet Take 40 mg by mouth every morning.  07/02/13  Yes Historical Provider, MD  budesonide-formoterol (SYMBICORT) 160-4.5 MCG/ACT inhaler Inhale 2 puffs into the lungs 2 (two) times daily. 08/06/13  Yes Barton Dubois, MD  digoxin (LANOXIN) 0.25 MG tablet Take 0.125 mg by mouth every morning.    Yes Historical Provider, MD  diltiazem (CARDIZEM CD) 240 MG 24 hr capsule Take 240 mg by mouth every morning.  04/08/14  Yes Historical Provider, MD  docusate sodium (COLACE) 100 MG capsule Take 100 mg by mouth daily as needed (constipation).    Yes Historical Provider, MD  Ferrous Sulfate (IRON) 325 (65 FE) MG TABS Take 325 mg by mouth daily.   Yes Historical Provider, MD  fluticasone (FLONASE) 50 MCG/ACT nasal spray Place 1 spray into both nostrils daily as needed for allergies or rhinitis. 08/06/13  Yes Barton Dubois, MD  furosemide (LASIX) 20 MG tablet Take 1 tablet (20 mg total) by mouth daily. Patient taking differently: Take 40 mg by mouth daily.  06/13/14  Yes Theodis Blaze, MD  metoprolol succinate (TOPROL-XL) 50 MG 24 hr tablet Take 12.5 mg by mouth daily. Take with or immediately following a meal.   Yes Historical Provider, MD  Omega-3 Fatty Acids (FISH OIL) 1000 MG CAPS Take 1,000 mg by mouth daily.   Yes  Historical Provider, MD  oxybutynin (DITROPAN-XL) 5 MG 24 hr tablet Take 10 mg by mouth at bedtime.   Yes Historical Provider, MD  pantoprazole (PROTONIX) 40 MG tablet Take 1 tablet (40 mg total) by mouth daily. 0/1/09  Yes Delora Fuel, MD  rivaroxaban (XARELTO) 20 MG TABS tablet Take 20 mg by mouth daily with supper.   Yes Historical Provider, MD  tiotropium (SPIRIVA) 18 MCG inhalation capsule Place 1 capsule (18 mcg total) into inhaler and inhale daily. 08/06/13  Yes Barton Dubois, MD  traMADol (ULTRAM) 50 MG tablet Take 1 tablet (50 mg total) by mouth every 6 (six) hours as needed for moderate pain (pain). Patient taking differently: Take 50 mg by mouth at bedtime as needed for moderate pain (pain).  09/19/14  Yes Donielle Liston Alba, PA-C  VENTOLIN HFA 108 (90 BASE) MCG/ACT inhaler Inhale 2 puffs into the lungs 4 (four) times daily as needed for wheezing or shortness of breath.  06/07/14  Yes Historical Provider, MD  vitamin B-12 (CYANOCOBALAMIN) 500 MCG tablet Take 1,000 mcg by mouth daily.    Yes Historical Provider, MD  iron polysaccharides (NIFEREX) 150 MG capsule Take 1 capsule (150 mg total) by mouth daily. Patient not taking: Reported on 10/07/2014 04/29/14   Annita Brod, MD  levofloxacin (LEVAQUIN) 500 MG tablet Take 1 tablet (500 mg total) by mouth daily. For 5 days then stop. Patient not taking: Reported on 10/07/2014 09/19/14   Nani Skillern, PA-C  Vital Signs: BP 139/44 mmHg  Pulse 69  Temp(Src) 97.5 F (36.4 C) (Oral)  Resp 18  Ht 5\' 5"  (1.651 m)  Wt 120 lb 8 oz (54.658 kg)  BMI 20.05 kg/m2  SpO2 98%  Physical Exam pt awake/alert; rt chest tube intact, insertion site ok, mild- mod tender, approx 300 cc amber fluid in miniexpress Imaging: Dg Chest Port 1 View  10/13/2014   CLINICAL DATA:  History of recurrent spontaneous pneumothorax, post fluoroscopic guided right chest tube placement.  EXAM: PORTABLE CHEST - 1 VIEW  COMPARISON:  10/12/2014; 10/11/2014;  10/10/2014; 10/09/2014; fluoroscopic guided right-sided chest tube placement -10/09/2014  FINDINGS: Grossly unchanged cardiac silhouette and mediastinal contours given kyphotic projection. Stable positioning of support apparatus. Interval reduction in persistent small right basilar pneumothorax. The lungs remain hyperexpanded. Grossly unchanged slightly asymmetric biapical pleural parenchymal thickening, right greater than left. Unchanged right basilar linear heterogeneous opacities favored to represent atelectasis. No new focal airspace opacities. No evidence of edema. Unchanged bones including sequela of prior impaction fracture involving the right humeral neck.  IMPRESSION: Stable position of support apparatus with interval reduction in persistent small right basilar pneumothorax. Continued attention on follow-up is recommended.   Electronically Signed   By: Sandi Mariscal M.D.   On: 10/13/2014 08:51   Dg Chest Port 1 View  10/12/2014   CLINICAL DATA:  RIGHT pneumothorax.  EXAM: PORTABLE CHEST - 1 VIEW  COMPARISON:  Radiographs 10/11/2014  FINDINGS: Normal cardiac silhouette. RIGHT pigtail catheter the RIGHT lung base. There is interval increase in the subpulmonic pneumothorax at the RIGHT lung base. Pneumothorax occupies approximately 15% of lung volume. No mediastinal shift. Bilateral upper lobe pulmonary scarring.  IMPRESSION: Increased subpulmonic pneumothorax at the RIGHT lung base. A small bore chest tube in place.  These results will be called to the ordering clinician or representative by the Radiologist Assistant, and communication documented in the PACS or zVision Dashboard.   Electronically Signed   By: Suzy Bouchard M.D.   On: 10/12/2014 09:32   Dg Chest Port 1 View  10/11/2014   CLINICAL DATA:  Chest tube placement for pneumothorax  EXAM: PORTABLE CHEST - 1 VIEW  COMPARISON:  October 10, 2014  FINDINGS: The chest catheter remains at the right base. There is no demonstrable pneumothorax. There is  postoperative change and scarring in the right apex. Elsewhere lungs are clear. Heart size and pulmonary vascularity are normal. No adenopathy. There is evidence of old trauma involving the proximal right humerus, stable.  IMPRESSION: No pneumothorax. Scarring and postoperative change right apex. No edema or consolidation. No appreciable change compared to most recent prior study.   Electronically Signed   By: Lowella Grip III M.D.   On: 10/11/2014 08:04   Portable Chest 1 View  10/10/2014   CLINICAL DATA:  Reason pneumothorax on right.  EXAM: PORTABLE CHEST - 1 VIEW  COMPARISON:  October 09, 2014  FINDINGS: There is a chest catheter on the right inferiorly. There has been interval resolution of pneumothorax on the right. There is postoperative change in the right apex with scarring. There is no edema or consolidation. Heart size and pulmonary vascularity are normal. No adenopathy. There is evidence of old trauma involving the proximal right humerus with remodeling, stable.  IMPRESSION: Resolution of pneumothorax following right chest catheter placement. Stable scarring and postoperative change right apex. No edema or consolidation.   Electronically Signed   By: Lowella Grip III M.D.   On: 10/10/2014 08:07   Ir Image  Guided Drainage By Percutaneous Catheter  10/09/2014   CLINICAL DATA:  74 year old female with recurrent right-sided pneumothorax. She has been referred for image guided chest tube placement.  EXAM: FLUOROSCOPIC GUIDED RIGHT CHEST TUBE PLACEMENT  FLUOROSCOPY TIME:  6 minutes 12 seconds  MEDICATIONS: 0.5 mg Versed, 12.5 mcg fentanyl  20 minutes sedation time  TECHNIQUE: The procedure, risks, benefits, and alternatives were explained to the patient and the patient's family. Questions regarding the procedure were encouraged and answered. The patient understands and consents to the procedure.  Initial image demonstrates pneumothorax on the right, with the air in the inferior chest.  The right  lateral chest, in the mid axillary line at the level of the nipple was prepped with Betadine in a sterile fashion, and a sterile drape was applied covering the operative field. A sterile gown and sterile gloves were used for the procedure. Local anesthesia was provided with 1% Lidocaine.  1% lidocaine was used to anesthetize the skin and subcutaneous tissues to the level of the pleural space, with air aspirated at the level the pleura. A small stab incision was made with 11 blade scalpel, and the Yueh needle was advanced into the pleural space. The catheter was advanced off the needle when air was aspirated. An 035 wire was advanced into the pleural space, and then a 10 French pigtail catheter was advanced over the wire into the pleural space. Final position of the catheter was dependently within the gas at the inferior aspect of the chest cavity.  Final image was stored.  Patient tolerated the procedure well and remained hemodynamically stable throughout.  No complications were encountered and no significant blood loss was encounter  IMPRESSION: Status post fluoroscopic placement of right-sided thoracostomy tube.  Signed,  Dulcy Fanny. Earleen Newport, DO  [facility]  [facility]  PLAN: Thoracostomy tube will be placed to atrium drainage, with continuous aspiration overnight to 20 cm water pressure.   Electronically Signed   By: Corrie Mckusick D.O.   On: 10/09/2014 18:55    Labs:  CBC:  Recent Labs  09/14/14 1154 09/15/14 0614 09/17/14 0255 10/09/14 0827  WBC 5.8 14.3* 10.4 5.1  HGB 12.8 11.8* 11.6* 13.1  HCT 41.0 38.7 36.9 43.7  PLT 83* 92* 104* 98*    COAGS:  Recent Labs  10/14/13 1410  04/28/14 1355 04/29/14 0004 04/29/14 0620 04/30/14 0600 06/08/14 1201 06/16/14 1557  INR 1.08  --   --   --   --   --  1.32 1.03  APTT  --   < > 50* 86* 61* 50*  --   --   < > = values in this interval not displayed.  BMP:  Recent Labs  09/14/14 1154  09/15/14 0614 09/17/14 0255 10/09/14 0827  NA 138 137 137 138  K 3.7 3.6 4.7 4.0  CL 97* 95* 97* 99*  CO2 30 34* 32 31  GLUCOSE 124* 134* 115* 112*  BUN 16 14 17 16   CALCIUM 8.9 8.8* 8.4* 8.6*  CREATININE 0.67 0.63 0.54 0.83  GFRNONAA >60 >60 >60 >60  GFRAA >60 >60 >60 >60    LIVER FUNCTION TESTS:  Recent Labs  04/28/14 0349 06/08/14 1319 09/15/14 0614 10/09/14 0827  BILITOT 0.5 0.6 0.6 0.4  AST 16 21 16 16   ALT 13 13 12* 12*  ALKPHOS 55 72 62 56  PROT 6.0 8.0 6.3* 6.6  ALBUMIN 2.5* 3.7 2.9* 3.3*    Assessment and Plan: Recurrent right PTX ,s/p  IR pigtail catheter placed 7/27; CXR today with interval reduction in persistent small rt basilar ptx; cont to monitor   Signed: D. Rowe Robert 10/13/2014, 9:51 AM   I spent a total of 15 minutes in face to face in clinical consultation/evaluation, greater than 50% of which was counseling/coordinating care for right chest tube placement

## 2014-10-14 ENCOUNTER — Inpatient Hospital Stay (HOSPITAL_COMMUNITY): Payer: Commercial Managed Care - HMO

## 2014-10-14 DIAGNOSIS — J939 Pneumothorax, unspecified: Secondary | ICD-10-CM | POA: Insufficient documentation

## 2014-10-14 LAB — URINALYSIS, ROUTINE W REFLEX MICROSCOPIC
Bilirubin Urine: NEGATIVE
GLUCOSE, UA: NEGATIVE mg/dL
KETONES UR: NEGATIVE mg/dL
NITRITE: NEGATIVE
PH: 6.5 (ref 5.0–8.0)
Protein, ur: NEGATIVE mg/dL
Specific Gravity, Urine: 1.01 (ref 1.005–1.030)
Urobilinogen, UA: 1 mg/dL (ref 0.0–1.0)

## 2014-10-14 LAB — URINE MICROSCOPIC-ADD ON

## 2014-10-14 NOTE — Progress Notes (Signed)
Physical Therapy Treatment Patient Details Name: [name] [name] MRN: [id number] DOB: 07/12/1940 Today's Date: 10/14/2014    History of Present Illness Patient is a 74 y.o. female with hx of COPD, A-fib, HTN, CKD, HLD and pnemothorax on right presents with SOB, not improved with 02. CXR showed evidence of a recurrent 40% right sided pneumothorax s/p chest tube placement.    PT Comments    Pt progressing towards physical therapy goals. Was able to maintain O2 sats >93% throughout session on 4L/min supplemental O2. Pt motivated to participate with therapy. Continue to feel that pt would benefit from further therapy services at the SNF level prior to return home alone.   Follow Up Recommendations  SNF;Supervision/Assistance - 24 hour     Equipment Recommendations  None recommended by PT    Recommendations for Other Services       Precautions / Restrictions Precautions Precautions: Fall Precaution Comments: chest tube - disconnected from suctionf or therapy Restrictions Weight Bearing Restrictions: No    Mobility  Bed Mobility Overal bed mobility: Needs Assistance Bed Mobility: Supine to Sit;Sit to Supine     Supine to sit: Supervision Sit to supine: Supervision   General bed mobility comments: Increased time to transition to/from EOB. Pt was eager to sit up and did not require assist. Supervision for safety.   Transfers Overall transfer level: Needs assistance Equipment used: Rolling walker (2 wheeled) Transfers: Sit to/from [facility] Sit to Stand: Min guard Stand pivot transfers: Min guard       General transfer comment: VC's for hand placement on seated surface for safety. Pt did not require RW to transfer to/from [facility].   Ambulation/Gait Ambulation/Gait assistance: Min guard Ambulation Distance (Feet): 75 Feet Assistive device: Rolling walker (2 wheeled) Gait Pattern/deviations: Step-through pattern;Decreased stride length;Trunk flexed Gait  velocity: Decreased Gait velocity interpretation: Below normal speed for age/gender General Gait Details: Pt generally moving slow and guarded. Poor safety with regards to chest tube. Pt on 4L/min supplemental O2 throughout session and sats remained 94%. Pt was cued for pursed-lip breathing.    Stairs            Wheelchair Mobility    Modified Rankin (Stroke Patients Only)       Balance Overall balance assessment: Needs assistance Sitting-balance support: Feet supported;No upper extremity supported Sitting balance-Leahy Scale: Good     Standing balance support: No upper extremity supported Standing balance-Leahy Scale: Fair Standing balance comment: Pt stood unsupported at [facility] and performed peri-care without assistance.                     Cognition Arousal/Alertness: Awake/alert Behavior During Therapy: WFL for tasks assessed/performed Overall Cognitive Status: Within Functional Limits for tasks assessed                      Exercises      General Comments        Pertinent Vitals/Pain Pain Assessment: Faces Pain Score: 7  Faces Pain Scale: Hurts little more Pain Location: right side Pain Descriptors / Indicators: Sore Pain Intervention(s): Limited activity within patient's tolerance;Monitored during session;Repositioned    Home Living Family/patient expects to be discharged to:: Private residence Living Arrangements: Alone     Home Access: Level entry   Home Layout: One level Home Equipment: Environmental consultant - 2 wheels;Bedside commode;Shower seat;Adaptive equipment      Prior Function Level of Independence: Independent with assistive device(s)      Comments: Pt does not  drive; family assists with grocery shopping   PT Goals (current goals can now be found in the care plan section) Acute Rehab PT Goals Patient Stated Goal: not stated PT Goal Formulation: With patient Time For Goal Achievement: 10/25/14 Potential to Achieve Goals:  Good Progress towards PT goals: Progressing toward goals    Frequency  Min 3X/week    PT Plan Current plan remains appropriate    Co-evaluation             End of Session Equipment Utilized During Treatment: Gait belt;Oxygen;Other (comment) (Chest tube) Activity Tolerance: Patient limited by fatigue Patient left: in bed;with call bell/phone within reach;with family/visitor present     Time: 0762-2633 PT Time Calculation (min) (ACUTE ONLY): 30 min  Charges:  $Gait Training: 8-22 mins $Therapeutic Activity: 8-22 mins                    G Codes:      Rolinda Roan Oct 23, 2014, 2:44 PM   Rolinda Roan, PT, DPT Acute Rehabilitation Services Pager: [phone]

## 2014-10-14 NOTE — Evaluation (Addendum)
Occupational Therapy Evaluation Patient Details Name: [name] [name] MRN: [id number] DOB: 05-Aug-1940 Today's Date: 10/14/2014    History of Present Illness Patient is a 74 y.o. female with hx of COPD, A-fib, HTN, CKD, HLD and pnemothorax on right presents with SOB, not improved with 02. CXR showed evidence of a recurrent 40% right sided pneumothorax s/p chest tube placement.   Clinical Impression   Pt independent with ADLs, PTA. Feel pt will benefit from acute OT to increase independence prior to d/c. Pt limited due to dizziness/lightheadedness. Recommending SNF for rehab, and pt reports she will think about it.     Follow Up Recommendations  SNF    Equipment Recommendations  None recommended by OT    Recommendations for Other Services       Precautions / Restrictions Precautions Precautions: Fall Restrictions Weight Bearing Restrictions: No      Mobility Bed Mobility Overal bed mobility: Needs Assistance Bed Mobility: Supine to Sit;Sit to Supine     Supine to sit: Supervision Sit to supine: Supervision      Transfers Overall transfer level: Needs assistance   Transfers: Sit to/from Stand Sit to Stand: Min guard/Supervision              Balance  No LOB in session. Used RW for ambulation.                                           ADL Overall ADL's : Needs assistance/impaired     Grooming: Wash/dry face;Set up;Supervision/safety;Sitting               Lower Body Dressing: Min guard;Sit to/from stand   Toilet Transfer: Min guard;Ambulation;RW (sit to stand from bed/chair)           Functional mobility during ADLs: Min guard;Rolling walker General ADL Comments: Discussed d/c recommendation and safety concern with going home alone.  Educated pt on how to call her nurse.      Vision  Pt wears glasses.   Perception     Praxis      Pertinent Vitals/Pain Pain Assessment: 0-10 Pain Score: 7  Pain Location: right side   Pain Descriptors / Indicators: Sore;Throbbing Pain Intervention(s): Monitored during session   Pt on 4L of O2 during session and O2 in 90s at end of session.     Hand Dominance Left   Extremity/Trunk Assessment Upper Extremity Assessment Upper Extremity Assessment: Generalized weakness   Lower Extremity Assessment Lower Extremity Assessment: Defer to PT evaluation       Communication Communication Communication: HOH   Cognition Arousal/Alertness: Awake/alert Behavior During Therapy: WFL for tasks assessed/performed Overall Cognitive Status: Within Functional Limits for tasks assessed                     General Comments       Exercises       Shoulder Instructions      Home Living Family/patient expects to be discharged to:: Private residence Living Arrangements: Alone     Home Access: Level entry     Home Layout: One level     Bathroom Shower/Tub: Tub/shower unit         Home Equipment: Environmental consultant - 2 wheels;Bedside commode;Shower seat;Adaptive equipment Adaptive Equipment: Reacher;Sock aid        Prior Functioning/Environment Level of Independence: Independent with assistive device(s)  Comments: Pt does not drive; family assists with grocery shopping    OT Diagnosis: Generalized weakness;Acute pain   OT Problem List: Decreased strength;Pain;Cardiopulmonary status limiting activity;Decreased knowledge of precautions;Decreased knowledge of use of DME or AE;Decreased activity tolerance   OT Treatment/Interventions: Self-care/ADL training;DME and/or AE instruction;Therapeutic activities;Patient/family education;Balance training;Therapeutic exercise;Energy conservation    OT Goals(Current goals can be found in the care plan section) Acute Rehab OT Goals Patient Stated Goal: not stated OT Goal Formulation: With patient Time For Goal Achievement: 10/21/14 Potential to Achieve Goals: Good ADL Goals Pt Will Perform Lower Body Bathing: with  set-up;sit to/from stand Pt Will Perform Lower Body Dressing: with set-up;sit to/from stand Pt Will Transfer to Toilet: with supervision;ambulating  OT Frequency: Min 2X/week   Barriers to D/C:            Co-evaluation              End of Session Equipment Utilized During Treatment: Gait belt;Rolling walker;Oxygen  Activity Tolerance: Other (comment) (dizziness/lightheaded) Patient left: in chair;with call bell/phone within reach;with family/visitor present   Time: 1410-3013 OT Time Calculation (min): 14 min Charges:  OT General Charges $OT Visit: 1 Procedure OT Evaluation $Initial OT Evaluation Tier I: 1 Procedure G-CodesBenito Mccreedy OTR/L [phone] 10/14/2014, 11:42 AM

## 2014-10-14 NOTE — Progress Notes (Addendum)
      [address]       [place] [address]             [phone]      Subjective:  [name] states she is feeling better today.  She continues to have a cough.  Objective: Vital signs in last 24 hours: Temp:  [97.5 F (36.4 C)-98.5 F (36.9 C)] 97.5 F (36.4 C) (08/01 0443) Pulse Rate:  [67-73] 67 (08/01 0443) Cardiac Rhythm:  [-] Normal sinus rhythm (07/31 2035) Resp:  [17-18] 18 (08/01 0443) BP: (130-159)/(43-49) 130/45 mmHg (08/01 0443) SpO2:  [94 %-99 %] 99 % (08/01 0443) Weight:  [115 lb 8 oz (52.39 kg)] 115 lb 8 oz (52.39 kg) (08/01 0443)  Intake/Output from previous day: 07/31 0701 - 08/01 0700 In: 360 [P.O.:360] Out: 1085 [Urine:1000; Chest Tube:85]  General appearance: alert, cooperative and no distress Heart: regular rate and rhythm Lungs: coarse throughout Abdomen: soft, non-tender; bowel sounds normal; no masses,  no organomegaly Extremities: extremities normal, atraumatic, no cyanosis or edema Wound: clean and dry  Lab Results: No results for input(s): WBC, HGB, HCT, PLT in the last 72 hours. BMET: No results for input(s): NA, K, CL, CO2, GLUCOSE, BUN, CREATININE, CALCIUM in the last 72 hours.  PT/INR: No results for input(s): LABPROT, INR in the last 72 hours. ABG    Component Value Date/Time   TCO2 39 12/28/2013 1041   CBG (last 3)  No results for input(s): GLUCAP in the last 72 hours.  Assessment/Plan:  1. Chest tube- _ air leak with cough, CXR with basilar pneumothorax- will leave on suction 85 cc output yesterday 2.  Pulm- severe COPD on continuous use of oxygen at home, continue inhalers, nebs prn 3. GI - intermittent nausea, zofran, reglan  4. GU- UA negative 5. Dispo- patient stable, leave chest tube to suction, CXR with basilar pneumothorax + air leak on exam    LOS: 5 days    [name], [name] 10/14/2014  CXR with small persistent basilar PNTX convert back tp pleurovac 20 cm suction  patient examined and medical  record reviewed,agree with above note. [name] 10/14/2014

## 2014-10-14 NOTE — Progress Notes (Signed)
Patient ID: [name] [name], female   DOB: [birth year], 74 y.o.   MRN: [id number]    Referring Physician(s): Prescott Gum  Chief Complaint: Recurrent right pneumothorax   Subjective: Pt feeling ok, still with some soreness at rt chest tube site Anxious to go home soon.   Allergies: Review of patient's allergies indicates no known allergies.  Medications:  Current facility-administered medications:  .  0.9 %  sodium chloride infusion, 250 mL, Intravenous, PRN, [name] R [name], PA-C .  acetaminophen (TYLENOL) tablet 650 mg, 650 mg, Oral, Q4H PRN, 650 mg at 10/14/14 0834 **OR** acetaminophen (TYLENOL) suppository 650 mg, 650 mg, Rectal, Q4H PRN, Melrose Nakayama, MD .  albuterol (PROVENTIL) (2.5 MG/3ML) 0.083% nebulizer solution 2.5 mg, 2.5 mg, Nebulization, Q6H PRN, [name] R [name], PA-C, 2.5 mg at 10/13/14 2127 .  atorvastatin (LIPITOR) tablet 40 mg, 40 mg, Oral, q1800, [name] R [name], PA-C, 40 mg at 10/13/14 1732 .  benzonatate (TESSALON) capsule 100 mg, 100 mg, Oral, TID, Melrose Nakayama, MD, 100 mg at 10/13/14 2156 .  budesonide-formoterol (SYMBICORT) 160-4.5 MCG/ACT inhaler 2 puff, 2 puff, Inhalation, BID, [name] R [name], PA-C, 2 puff at 10/13/14 2127 .  [START ON 10/15/2014] digoxin (LANOXIN) tablet 0.125 mg, 0.125 mg, Oral, Daily, Ivin Poot, MD .  diltiazem (CARDIZEM CD) 24 hr capsule 240 mg, 240 mg, Oral, Daily, [name] R [name], PA-C, 240 mg at 10/13/14 1044 .  docusate sodium (COLACE) capsule 100 mg, 100 mg, Oral, BID, [name] R [name], PA-C, 100 mg at 10/13/14 2155 .  ferrous sulfate tablet 325 mg, 325 mg, Oral, Daily, Ivin Poot, MD, 325 mg at 10/13/14 1045 .  fluticasone (FLONASE) 50 MCG/ACT nasal spray 1 spray, 1 spray, Each Nare, Daily PRN, Ivin Poot, MD .  gu[facility]FEN[facility]in [facility]) 12 hr tablet 600 mg, 600 mg, Oral, BID, Donielle M Zimmerman, PA-C, 600 mg at 10/13/14 2155 .  metoprolol succinate (TOPROL-XL) 24 hr tablet 12.5 mg, 12.5 mg, Oral, Daily, [name] R  [name], PA-C, 12.5 mg at 10/13/14 1425 .  ondansetron (ZOFRAN) tablet 4 mg, 4 mg, Oral, Q6H PRN **OR** ondansetron (ZOFRAN) injection 4 mg, 4 mg, Intravenous, Q6H PRN, [name] R [name], PA-C, 4 mg at 10/10/14 0653 .  oxybutynin (DITROPAN) tablet 10 mg, 10 mg, Oral, QHS, [name] R [name], PA-C, 10 mg at 10/13/14 2155 .  pantoprazole (PROTONIX) EC tablet 40 mg, 40 mg, Oral, Daily, [name] R [name], PA-C, 40 mg at 10/13/14 1046 .  sodium chloride 0.9 % injection 3 mL, 3 mL, Intravenous, Q12H, [name] R [name], PA-C, Stopped at 10/12/14 1119 .  sodium chloride 0.9 % injection 3 mL, 3 mL, Intravenous, Q12H, [name] R [name], PA-C, 3 mL at 10/13/14 2156 .  sodium chloride 0.9 % injection 3 mL, 3 mL, Intravenous, PRN, [name] R [name], PA-C .  tiotropium (SPIRIVA) inhalation capsule 18 mcg, 18 mcg, Inhalation, Daily, [name] R [name], PA-C, 18 mcg at 10/13/14 0948 .  traMADol (ULTRAM) tablet 50 mg, 50 mg, Oral, 4 times per day, Melrose Nakayama, MD, 50 mg at 10/14/14 0552    Vital Signs: BP 130/45 mmHg  Pulse 67  Temp(Src) 97.5 F (36.4 C) (Oral)  Resp 18  Ht 5\' 5"  (1.651 m)  Wt 115 lb 8 oz (52.39 kg)  BMI 19.22 kg/m2  SpO2 99%  Physical Exam pt awake/alert; rt chest tube intact, insertion site ok, mild- mod tender, approx 85 cc amber fluid in mini-express   Imaging: Dg Chest Port 1 [address]  10/14/2014   CLINICAL DATA:  74 year old female with pneumothorax. Subsequent encounter.  EXAM: PORTABLE CHEST - 1 VIEW  COMPARISON:  10/13/2014.  FINDINGS: Pigtail catheter inferior aspect of the right thorax. Inferiorly and laterally located right-sided pneumothorax minimally changed from the prior exam. Tiny component of pneumothorax medially and within the apex unchanged.  Asymmetric airspace disease greatest right lung base stable.  Postsurgical changes right lung apex with apical pleural thickening slightly nodular appearance. Attention to this on followup. This is been noted on prior exams.  Pulmonary vascular  prominence most notable centrally.  Calcified aorta.  Heart size within normal limits.  IMPRESSION: Right-sided chest tube in place with similar appearance of right-sided pneumothorax as noted above.   Electronically Signed   By: [name] M.D.   On: 10/14/2014 07:29   Dg Chest Port 1 View  10/13/2014   CLINICAL DATA:  History of recurrent spontaneous pneumothorax, post fluoroscopic guided right chest tube placement.  EXAM: PORTABLE CHEST - 1 VIEW  COMPARISON:  10/12/2014; 10/11/2014; 10/10/2014; 10/09/2014; fluoroscopic guided right-sided chest tube placement -10/09/2014  FINDINGS: Grossly unchanged cardiac silhouette and mediastinal contours given kyphotic projection. Stable positioning of support apparatus. Interval reduction in persistent small right basilar pneumothorax. The lungs remain hyperexpanded. Grossly unchanged slightly asymmetric biapical pleural parenchymal thickening, right greater than left. Unchanged right basilar linear heterogeneous opacities favored to represent atelectasis. No new focal airspace opacities. No evidence of edema. Unchanged bones including sequela of prior impaction fracture involving the right humeral neck.  IMPRESSION: Stable position of support apparatus with interval reduction in persistent small right basilar pneumothorax. Continued attention on follow-up is recommended.   Electronically Signed   By: Sandi Mariscal M.D.   On: 10/13/2014 08:51   Dg Chest Port 1 View  10/12/2014   CLINICAL DATA:  RIGHT pneumothorax.  EXAM: PORTABLE CHEST - 1 VIEW  COMPARISON:  Radiographs 10/11/2014  FINDINGS: Normal cardiac silhouette. RIGHT pigtail catheter the RIGHT lung base. There is interval increase in the subpulmonic pneumothorax at the RIGHT lung base. Pneumothorax occupies approximately 15% of lung volume. No mediastinal shift. Bilateral upper lobe pulmonary scarring.  IMPRESSION: Increased subpulmonic pneumothorax at the RIGHT lung base. A small bore chest tube in place.   These results will be called to the ordering clinician or representative by the Radiologist Assistant, and communication documented in the PACS or zVision Dashboard.   Electronically Signed   By: Suzy Bouchard M.D.   On: 10/12/2014 09:32   Dg Chest Port 1 View  10/11/2014   CLINICAL DATA:  Chest tube placement for pneumothorax  EXAM: PORTABLE CHEST - 1 VIEW  COMPARISON:  October 10, 2014  FINDINGS: The chest catheter remains at the right base. There is no demonstrable pneumothorax. There is postoperative change and scarring in the right apex. Elsewhere lungs are clear. Heart size and pulmonary vascularity are normal. No adenopathy. There is evidence of old trauma involving the proximal right humerus, stable.  IMPRESSION: No pneumothorax. Scarring and postoperative change right apex. No edema or consolidation. No appreciable change compared to most recent prior study.   Electronically Signed   By: Lowella Grip III M.D.   On: 10/11/2014 08:04    Labs:  CBC:  Recent Labs  09/14/14 1154 09/15/14 0614 09/17/14 0255 10/09/14 0827  WBC 5.8 14.3* 10.4 5.1  HGB 12.8 11.8* 11.6* 13.1  HCT 41.0 38.7 36.9 43.7  PLT 83* 92* 104* 98*    COAGS:  Recent Labs  10/14/13 1410  04/28/14 1355  04/29/14 0004 04/29/14 0620 04/30/14 0600 06/08/14 1201 06/16/14 1557  INR 1.08  --   --   --   --   --  1.32 1.03  APTT  --   < > 50* 86* 61* 50*  --   --   < > = values in this interval not displayed.  BMP:  Recent Labs  09/14/14 1154 09/15/14 0614 09/17/14 0255 10/09/14 0827  NA 138 137 137 138  K 3.7 3.6 4.7 4.0  CL 97* 95* 97* 99*  CO2 30 34* 32 31  GLUCOSE 124* 134* 115* 112*  BUN 16 14 17 16   CALCIUM 8.9 8.8* 8.4* 8.6*  CREATININE 0.67 0.63 0.54 0.83  GFRNONAA >60 >60 >60 >60  GFRAA >60 >60 >60 >60    LIVER FUNCTION TESTS:  Recent Labs  04/28/14 0349 06/08/14 1319 09/15/14 0614 10/09/14 0827  BILITOT 0.5 0.6 0.6 0.4  AST 16 21 16 16   ALT 13 13 12* 12*  ALKPHOS 55 72 62  56  PROT 6.0 8.0 6.3* 6.6  ALBUMIN 2.5* 3.7 2.9* 3.3*    Assessment and Plan: Recurrent right PTX ,s/p IR pigtail catheter placed 7/27 CXR still with basilar PTX Output variable but trending down. Plan per CTS.   SignedAscencion Dike 10/14/2014, 9:46 AM   I spent a total of 15 minutes in face to face in clinical consultation/evaluation, greater than 50% of which was counseling/coordinating care for right chest tube placement

## 2014-10-14 NOTE — Care Management Important Message (Signed)
Important Message  Patient Details  Name: [name] [name] MRN: [id number] Date of Birth: 10-18-1940   Medicare Important Message Given:  Yes-second notification given    Nathen May 10/14/2014, 12:12 [place] Message  Patient Details  Name: [name] [name] MRN: [id number] Date of Birth: May 05, 1940   Medicare Important Message Given:  Yes-second notification given    Nathen May 10/14/2014, 12:12 PM

## 2014-10-14 NOTE — Clinical Social Work Note (Signed)
[name] attempted to contact patient's son [name] at [phone] to discuss other bed offers in the [place] area.  [name] left a message awaiting call back from patient's son.  Jones Broom. [place], MSW, [place] 10/14/2014 1:38 PM

## 2014-10-15 ENCOUNTER — Inpatient Hospital Stay (HOSPITAL_COMMUNITY): Payer: Commercial Managed Care - HMO

## 2014-10-15 NOTE — Progress Notes (Addendum)
      [address]       [place], [address]             [phone]      Subjective:  [name] [name] states she continues to feel better.  She has no new complaints.  Objective: Vital signs in last 24 hours: Temp:  [97.6 F (36.4 C)-98 F (36.7 C)] 97.6 F (36.4 C) (08/02 0441) Pulse Rate:  [64-74] 65 (08/02 0441) Cardiac Rhythm:  [-] Normal sinus rhythm (08/01 2013) Resp:  [17-18] 18 (08/02 0441) BP: (132-150)/(43-54) 132/43 mmHg (08/02 0441) SpO2:  [96 %-100 %] 96 % (08/02 0502) Weight:  [114 lb 9.6 oz (51.982 kg)] 114 lb 9.6 oz (51.982 kg) (08/02 0441)  Intake/Output from previous day: 08/01 0701 - 08/02 0700 In: 240 [P.O.:240] Out: 250 [Urine:200; Chest Tube:50]  General appearance: alert, cooperative and no distress Heart: regular rate and rhythm Lungs: coarse throughout Abdomen: soft, non-tender; bowel sounds normal; no masses,  no organomegaly Extremities: extremities normal, atraumatic, no cyanosis or edema Wound: clean and dry  Lab Results: No results for input(s): WBC, HGB, HCT, PLT in the last 72 hours. BMET: No results for input(s): NA, K, CL, CO2, GLUCOSE, BUN, CREATININE, CALCIUM in the last 72 hours.  PT/INR: No results for input(s): LABPROT, INR in the last 72 hours. ABG    Component Value Date/Time   TCO2 39 12/28/2013 1041   CBG (last 3)  No results for input(s): GLUCAP in the last 72 hours.  Assessment/Plan:  1. Chest tube- + air leak with cough, CXR shows some improvement in basilar pneumothorax, but there is a new apical pneumothorax present..... Leave chest tube on suction 2. Pulm- severe COPD, on oxygen continuous at home. Continue nebulizers prn, home inhalers 3. Dispo- patient stable, leave chest tube on suction, repeat CXR in AM  LOS: 6 days    [name], [name] 10/15/2014  patient examined and medical record reviewed,agree with above note. [name] [name] 10/16/2014

## 2014-10-16 ENCOUNTER — Inpatient Hospital Stay (HOSPITAL_COMMUNITY): Payer: Commercial Managed Care - HMO

## 2014-10-16 NOTE — Progress Notes (Addendum)
      [address]       [place] [address]             [phone]      Subjective:  [name] [name] continues to feel better.  No further nausea or vomiting.  Objective: Vital signs in last 24 hours: Temp:  [97.7 F (36.5 C)-98.4 F (36.9 C)] 98.2 F (36.8 C) (08/03 0338) Pulse Rate:  [64-79] 68 (08/03 0805) Cardiac Rhythm:  [-] Normal sinus rhythm (08/02 2016) Resp:  [17-20] 18 (08/03 0805) BP: (131-169)/(48-73) 138/55 mmHg (08/03 0338) SpO2:  [93 %-100 %] 98 % (08/03 0805) Weight:  [113 lb 12.8 oz (51.619 kg)] 113 lb 12.8 oz (51.619 kg) (08/03 0338)  Intake/Output from previous day: 08/02 0701 - 08/03 0700 In: 9758 [P.O.:1420] Out: 1220 [Urine:1150; Chest Tube:70]  General appearance: alert, cooperative and no distress Heart: regular rate and rhythm Lungs: wheezes bilaterally Abdomen: soft, non-tender; bowel sounds normal; no masses,  no organomegaly Extremities: extremities normal, atraumatic, no cyanosis or edema Wound: clean and dry  Lab Results: No results for input(s): WBC, HGB, HCT, PLT in the last 72 hours. BMET: No results for input(s): NA, K, CL, CO2, GLUCOSE, BUN, CREATININE, CALCIUM in the last 72 hours.  PT/INR: No results for input(s): LABPROT, INR in the last 72 hours. ABG    Component Value Date/Time   TCO2 39 12/28/2013 1041   CBG (last 3)  No results for input(s): GLUCAP in the last 72 hours.  Assessment/Plan:  1. Chest tube- no air leak present.... CXR shows improvement of pneumothorax- will leave chest tube on suction today 2. Pulm- severe COPD, continue home nebs, inhalers 3. Dispo- patient remains stable, pneumothorax resolved... Leave chest tube to suction today   LOS: 7 days    [name] [name] 10/16/2014  Tomorrow reduce suction level to 10, water seal the next day Long-term plan is for patient to go home with pigtail catheter in place with mini express Because the patient recurred her air leak and pneumothorax on  waterseal shortly after catheter placement we will go slowly. Chest x-ray today looks much improved  patient examined and medical record reviewed,agree with above note. [name] [name] 10/16/2014

## 2014-10-16 NOTE — Progress Notes (Signed)
Physical Therapy Treatment Patient Details Name: [name] [name] MRN: [id number] DOB: [birth year] Today's Date: 10/16/2014    History of Present Illness Patient is a 74 y.o. female with hx of COPD, A-fib, HTN, CKD, HLD and pnemothorax on right presents with SOB, not improved with 02. CXR showed evidence of a recurrent 40% right sided pneumothorax s/p chest tube placement.    PT Comments    Pt progressing towards physical therapy goals. Was able to perform transfers and ambulation with min guard assist. Pt was able to improve ambulation distance, utilizing chair follow once for seated rest break. Sats remained 91% on 3L/min supplemental O2 during last 1/2 of gait training. Pt returned to 4L/min once back in room. Will continue to follow.   Follow Up Recommendations  SNF;Supervision/Assistance - 24 hour     Equipment Recommendations  None recommended by PT    Recommendations for Other Services       Precautions / Restrictions Precautions Precautions: Fall Precaution Comments: chest tube - disconnected from suction for therapy Restrictions Weight Bearing Restrictions: No    Mobility  Bed Mobility Overal bed mobility: Needs Assistance Bed Mobility: Supine to Sit     Supine to sit: Supervision     General bed mobility comments: Increased time to transition to/from EOB. Pt was eager to sit up and did not require assist. Supervision for safety.   Transfers Overall transfer level: Needs assistance Equipment used: Rolling walker (2 wheeled) Transfers: Sit to/from [facility] Sit to Stand: Min guard Stand pivot transfers: Min guard       General transfer comment: VC's for hand placement on seated surface for safety. Pt did not require RW to transfer to/from [facility].   Ambulation/Gait Ambulation/Gait assistance: Min guard Ambulation Distance (Feet): 350 Feet Assistive device: Rolling walker (2 wheeled) Gait Pattern/deviations: Step-through pattern;Decreased  stride length Gait velocity: Decreased Gait velocity interpretation: Below normal speed for age/gender General Gait Details: Pt able to demonstrate a quicker gait speed this session, however continues to overall move slow. Sats remained in mid 90's in beginning of gait training on 4L/min supplemental O2. During last 1/2 of gait training O2 was decreased to 3L/min and sats remained 91%. Pt reports SOB which is relieved by seated rest.    Stairs            Wheelchair Mobility    Modified Rankin (Stroke Patients Only)       Balance Overall balance assessment: Needs assistance Sitting-balance support: Feet supported;No upper extremity supported Sitting balance-Leahy Scale: Good     Standing balance support: No upper extremity supported Standing balance-Leahy Scale: Fair Standing balance comment: Pt stood unsupported at [facility] and performed peri-care without assistance.                     Cognition Arousal/Alertness: Awake/alert Behavior During Therapy: WFL for tasks assessed/performed Overall Cognitive Status: Within Functional Limits for tasks assessed                      Exercises      General Comments        Pertinent Vitals/Pain Pain Assessment: Faces Faces Pain Scale: Hurts a little bit Pain Location: Chest tube site Pain Descriptors / Indicators: Operative site guarding Pain Intervention(s): Limited activity within patient's tolerance;Monitored during session;Repositioned    Home Living                      Prior Function  PT Goals (current goals can now be found in the care plan section) Acute Rehab PT Goals Patient Stated Goal: not stated PT Goal Formulation: With patient/family Time For Goal Achievement: 10/25/14 Potential to Achieve Goals: Good Progress towards PT goals: Progressing toward goals    Frequency  Min 3X/week    PT Plan Current plan remains appropriate    Co-evaluation             End of  Session Equipment Utilized During Treatment: Gait belt;Oxygen;Other (comment) (Chest tube) Activity Tolerance: Patient limited by fatigue Patient left: in bed;with call bell/phone within reach;with family/visitor present     Time: 3536-1443 PT Time Calculation (min) (ACUTE ONLY): 18 min  Charges:  $Gait Training: 8-22 mins                    G Codes:      Rolinda Roan 19-Oct-2014, 3:00 PM   Rolinda Roan, PT, DPT Acute Rehabilitation Services Pager: [phone]

## 2014-10-17 ENCOUNTER — Inpatient Hospital Stay (HOSPITAL_COMMUNITY): Payer: Commercial Managed Care - HMO

## 2014-10-17 MED ORDER — GUAIFENESIN ER 600 MG PO TB12
1200.0000 mg | ORAL_TABLET | Freq: Two times a day (BID) | ORAL | Status: DC | PRN
  Administered 2014-10-17: 1200 mg via ORAL
  Filled 2014-10-17 (×2): qty 2

## 2014-10-17 NOTE — Progress Notes (Addendum)
      [address]       [place] [address]             [phone]      Subjective:  Ms. Sinning continues to feel better.  She does have a cough which she is struggling to get stuff up with.  Objective: Vital signs in last 24 hours: Temp:  [97.9 F (36.6 C)-98.2 F (36.8 C)] 98 F (36.7 C) (08/04 0401) Pulse Rate:  [64-67] 67 (08/04 0401) Cardiac Rhythm:  [-] Normal sinus rhythm (08/03 2015) Resp:  [17-19] 19 (08/04 0401) BP: (125-144)/(45-57) 137/51 mmHg (08/04 0401) SpO2:  [98 %-100 %] 99 % (08/04 0742) Weight:  [113 lb 8 oz (51.483 kg)] 113 lb 8 oz (51.483 kg) (08/04 0401)  Intake/Output from previous day: 08/03 0701 - 08/04 0700 In: 1140 [P.O.:1140] Out: 2071 [Urine:2050; Stool:1; Chest Tube:20]  General appearance: alert, cooperative and no distress Heart: regular rate and rhythm Lungs: wheezes scattered Abdomen: soft, non-tender; bowel sounds normal; no masses,  no organomegaly Extremities: extremities normal, atraumatic, no cyanosis or edema Wound: clean and dry  Lab Results: No results for input(s): WBC, HGB, HCT, PLT in the last 72 hours. BMET: No results for input(s): NA, K, CL, CO2, GLUCOSE, BUN, CREATININE, CALCIUM in the last 72 hours.  PT/INR: No results for input(s): LABPROT, INR in the last 72 hours. ABG    Component Value Date/Time   TCO2 39 12/28/2013 1041   CBG (last 3)  No results for input(s): GLUCAP in the last 72 hours.  Assessment/Plan:  1. Chest tube - no air leak, CXR remains stable- will transition chest tube to water seal 2. Pulm- severe COPD, on oxygen, continue nebs, inhalers... Add mucinex for cough 3. Dispo- patient stable, transition chest tube to water seal, repeat CXR in AM   LOS: 8 days    [name], [name] 10/17/2014  Patient seen and examined, agree with above She looks much better Lung up on chest xray this AM Currently on water seal with no air leak  Remo Lipps C. Roxan Hockey, MD [facility] [phone]

## 2014-10-17 NOTE — Care Management Important Message (Signed)
Important Message  Patient Details  Name: PATRIECE ARCHBOLD MRN: [id number] Date of Birth: Aug 08, 1940   Medicare Important Message Given:  Yes-third notification given    Nathen May 10/17/2014, 12:06 [place] Message  Patient Details  Name: GEORGIANNE GRITZ MRN: [id number] Date of Birth: [birth year]   Medicare Important Message Given:  Yes-third notification given    Nathen May 10/17/2014, 12:05 [place] Message  Patient Details  Name: BRYER GOTTSCH MRN: [id number] Date of Birth: 19-Mar-1940   Medicare Important Message Given:  Yes-third notification given    Nathen May 10/17/2014, 12:05 PM

## 2014-10-17 NOTE — Progress Notes (Signed)
Occupational Therapy Treatment Patient Details Name: [name] [name] MRN: [id number] DOB: 09-30-1940 Today's Date: 10/17/2014    History of present illness Patient is a 74 y.o. female with hx of COPD, A-fib, HTN, CKD, HLD and pnemothorax on right presents with SOB, not improved with 02. CXR showed evidence of a recurrent 40% right sided pneumothorax s/p chest tube placement.   OT comments  Focus of session on LB bathing and dressing in sitting and standing grooming.  Pt requesting to walk around nursing unit with RW.  Follow Up Recommendations  SNF    Equipment Recommendations  None recommended by OT    Recommendations for Other Services      Precautions / Restrictions Precautions Precautions: Fall Precaution Comments: chest tube Restrictions Weight Bearing Restrictions: No       Mobility Bed Mobility Overal bed mobility: Needs Assistance Bed Mobility: Supine to Sit;Sit to Supine     Supine to sit: HOB elevated;Modified independent (Device/Increase time) Sit to supine: Modified independent (Device/Increase time)      Transfers Overall transfer level: Needs assistance Equipment used: Rolling walker (2 wheeled) Transfers: Sit to/from Stand Sit to Stand: Supervision              Balance     Sitting balance-Leahy Scale: Good       Standing balance-Leahy Scale: Fair                     ADL Overall ADL's : Needs assistance/impaired     Grooming: Wash/dry hands;Brushing hair;Standing;Supervision/safety       Lower Body Bathing: Set up;Sitting/lateral leans Lower Body Bathing Details (indicate cue type and reason): washed feet Upper Body Dressing : Set up;Sitting Upper Body Dressing Details (indicate cue type and reason): front opening gown Lower Body Dressing: Set up;Sitting/lateral leans Lower Body Dressing Details (indicate cue type and reason): changed socks Toilet Transfer: Supervision/safety;Stand-pivot;BSC   Toileting- Clothing  Manipulation and Hygiene: Supervision/safety;Sit to/from stand       Functional mobility during ADLs: Supervision/safety;Rolling walker General ADL Comments: Pt eager to walk after ADL training, ambulated around nurses station and back to room on 3L 02 with RW      Vision                     Perception     Praxis      Cognition   Behavior During Therapy: [facility] for tasks assessed/performed Overall Cognitive Status: Within Functional Limits for tasks assessed                       Extremity/Trunk Assessment               Exercises     Shoulder Instructions       General Comments      Pertinent Vitals/ Pain       Pain Assessment: Faces Faces Pain Scale: Hurts a little bit Pain Location: chest tub site Pain Intervention(s): Monitored during session;Patient requesting pain meds-RN notified;Repositioned  Home Living                                          Prior Functioning/Environment              Frequency Min 2X/week     Progress Toward Goals  OT Goals(current goals can now be found in the care plan section)  Progress towards OT goals: Progressing toward goals     Plan Discharge plan remains appropriate    Co-evaluation                 End of Session Equipment Utilized During Treatment: Gait belt;Rolling walker;Oxygen   Activity Tolerance Patient tolerated treatment well (asked for breathing tx after walk)   Patient Left in bed;with call bell/phone within reach   Nurse Communication  (pt requesting breathing treatment)        Time: 1410-1440 OT Time Calculation (min): 30 min  Charges: OT General Charges $OT Visit: 1 Procedure OT Treatments $Self Care/Home Management : 8-22 mins $Therapeutic Activity: 8-22 mins  Malka So 10/17/2014, 3:22 PM  [phone]

## 2014-10-18 ENCOUNTER — Inpatient Hospital Stay (HOSPITAL_COMMUNITY): Payer: Commercial Managed Care - HMO

## 2014-10-18 ENCOUNTER — Other Ambulatory Visit: Payer: Self-pay | Admitting: Cardiothoracic Surgery

## 2014-10-18 DIAGNOSIS — J939 Pneumothorax, unspecified: Secondary | ICD-10-CM

## 2014-10-18 NOTE — Progress Notes (Signed)
Chest tube system changed to Mini express. Pt tolerated well. No change in resp status.

## 2014-10-18 NOTE — Progress Notes (Addendum)
      [address]       [place] [address]             [phone]      Subjective:  Ms. Deam has no complaints this morning.  She does states she is not going to SNF, however her son told me they were planning for her to be discharged to SNF.  Objective: Vital signs in last 24 hours: Temp:  [97.6 F (36.4 C)-98.4 F (36.9 C)] 98.4 F (36.9 C) (08/05 0545) Pulse Rate:  [66-71] 70 (08/05 0545) Cardiac Rhythm:  [-] Sinus bradycardia (08/04 2200) Resp:  [18] 18 (08/05 0545) BP: (140-153)/(50-73) 153/56 mmHg (08/05 0545) SpO2:  [95 %-100 %] 100 % (08/05 0545) Weight:  [112 lb 9.6 oz (51.075 kg)] 112 lb 9.6 oz (51.075 kg) (08/05 0545)  Intake/Output from previous day: 08/04 0701 - 08/05 0700 In: 720 [P.O.:720] Out: 3254 [Urine:3850; Stool:1]  General appearance: alert, cooperative and no distress Heart: irregularly irregular rhythm Lungs: wheezes bilaterally Abdomen: soft, non-tender; bowel sounds normal; no masses,  no organomegaly Wound: clean and dry  Lab Results: No results for input(s): WBC, HGB, HCT, PLT in the last 72 hours. BMET: No results for input(s): NA, K, CL, CO2, GLUCOSE, BUN, CREATININE, CALCIUM in the last 72 hours.  PT/INR: No results for input(s): LABPROT, INR in the last 72 hours. ABG    Component Value Date/Time   TCO2 39 12/28/2013 1041   CBG (last 3)  No results for input(s): GLUCAP in the last 72 hours.  Assessment/Plan:  1. Chest tube- no air leak, CXR is free from pneumothorax- will transition to Mini Express today....repeat CXR in AM 2. Pulm- severe COPD, continue home meds 3. CV- A. FIb, chronic, hemodynamically stable 4. Dispo- convert chest tube to Mini Express today, plan per PVT is home with Mini Express, plan to discharge chest tube in a few weeks... Need to clarify discharge plan   SNF vs. Home   LOS: 9 days    [name], [name] 10/18/2014  Agree with above

## 2014-10-18 NOTE — Progress Notes (Signed)
Physical Therapy Treatment Patient Details Name: [name] [name] MRN: [id number] DOB: Sep 12, 1940 Today's Date: 10/18/2014    History of Present Illness Patient is a 74 y.o. female with hx of COPD, A-fib, HTN, CKD, HLD and pnemothorax on right presents with SOB, not improved with 02. CXR showed evidence of a recurrent 40% right sided pneumothorax s/p chest tube placement.    PT Comments    Pt progressing well towards physical therapy goals. Feel she will do well with a rollator to practice instead of the RW as this is what she is used to at home. Pt appears accepting of plan for SNF and agrees that she will benefit from further mobility training prior to returning home alone. Will continue to follow and progress as able per POC.   Follow Up Recommendations  SNF;Supervision/Assistance - 24 hour     Equipment Recommendations  None recommended by PT    Recommendations for Other Services       Precautions / Restrictions Precautions Precautions: Fall Precaution Comments: chest tube Restrictions Weight Bearing Restrictions: No    Mobility  Bed Mobility Overal bed mobility: Needs Assistance Bed Mobility: Supine to Sit     Supine to sit: Supervision     General bed mobility comments: Increased time to transition to/from EOB. Pt was eager to sit up and did not require assist. Supervision for safety.   Transfers Overall transfer level: Needs assistance Equipment used: Rolling walker (2 wheeled) Transfers: Sit to/from Stand Sit to Stand: Min guard         General transfer comment: VC's for hand placement on seated surface for safety.   Ambulation/Gait Ambulation/Gait assistance: Min guard Ambulation Distance (Feet): 325 Feet Assistive device: Rolling walker (2 wheeled) Gait Pattern/deviations: Step-through pattern;Decreased stride length;Trunk flexed Gait velocity: Decreased Gait velocity interpretation: Below normal speed for age/gender General Gait Details: Pt able to  demonstrate a quicker gait speed this session, however continues to overall move slow. Sats remained in mid 90's throughout gait training on 3L/min supplemental O2.   Stairs            Wheelchair Mobility    Modified Rankin (Stroke Patients Only)       Balance Overall balance assessment: Needs assistance Sitting-balance support: Feet supported;No upper extremity supported Sitting balance-Leahy Scale: Good     Standing balance support: No upper extremity supported Standing balance-Leahy Scale: Fair                      Cognition Arousal/Alertness: Awake/alert Behavior During Therapy: WFL for tasks assessed/performed Overall Cognitive Status: Within Functional Limits for tasks assessed                      Exercises General Exercises - Lower Extremity Ankle Circles/Pumps: 10 reps Long Arc Quad: 10 reps Hip ABduction/ADduction: 10 reps Straight Leg Raises: 10 reps    General Comments        Pertinent Vitals/Pain Pain Assessment: No/denies pain    Home Living                      Prior Function            PT Goals (current goals can now be found in the care plan section) Acute Rehab PT Goals Patient Stated Goal: not stated PT Goal Formulation: With patient Time For Goal Achievement: 10/25/14 Potential to Achieve Goals: Good Progress towards PT goals: Progressing toward goals    Frequency  Min  3X/week    PT Plan Current plan remains appropriate    Co-evaluation             End of Session Equipment Utilized During Treatment: Gait belt;Oxygen;Other (comment) (Chest tube) Activity Tolerance: Patient tolerated treatment well Patient left: in bed;with call bell/phone within reach     Time: 1430-1451 PT Time Calculation (min) (ACUTE ONLY): 21 min  Charges:  $Gait Training: 8-22 mins                    G Codes:      Rolinda Roan November 12, 2014, 3:12 PM   Rolinda Roan, PT, DPT Acute Rehabilitation Services Pager:  [phone]

## 2014-10-18 NOTE — Progress Notes (Addendum)
[name] (Clinical Education officer, museum) visited pt at bedside to discuss [place]. Pt did state she does not want to dc to SNF. Pt says she is familiar with SNFs and does not believe they are good places. [name] explained medical team concern for home safety. After further discussion, pt continued to state that she did not want to dc to SNF, however, she said she would go to SNF if that is what her son says she needs to do. [name] spoke with pt son over the phone and confirmed dc plan is SNF. [name] provided bed offers and pt son would like to accept bed at [facility]. [name] will send updated clinicals to facility and confirm they are able to accept pt at dc.   [name] spoke with facility and they are able to accept pt as weekend discharge if medically stable for dc. Insurance is also able to provide authorization over the weekend if necessary. [name] will leave contact numbers for weekend [name].  [place], [place]

## 2014-10-19 ENCOUNTER — Inpatient Hospital Stay (HOSPITAL_COMMUNITY): Payer: Commercial Managed Care - HMO

## 2014-10-19 DIAGNOSIS — I2510 Atherosclerotic heart disease of native coronary artery without angina pectoris: Secondary | ICD-10-CM | POA: Diagnosis not present

## 2014-10-19 DIAGNOSIS — H60391 Other infective otitis externa, right ear: Secondary | ICD-10-CM | POA: Diagnosis not present

## 2014-10-19 DIAGNOSIS — I10 Essential (primary) hypertension: Secondary | ICD-10-CM | POA: Diagnosis not present

## 2014-10-19 DIAGNOSIS — J441 Chronic obstructive pulmonary disease with (acute) exacerbation: Secondary | ICD-10-CM | POA: Diagnosis not present

## 2014-10-19 DIAGNOSIS — M6281 Muscle weakness (generalized): Secondary | ICD-10-CM | POA: Diagnosis not present

## 2014-10-19 DIAGNOSIS — J9311 Primary spontaneous pneumothorax: Secondary | ICD-10-CM | POA: Diagnosis not present

## 2014-10-19 DIAGNOSIS — J939 Pneumothorax, unspecified: Secondary | ICD-10-CM | POA: Diagnosis not present

## 2014-10-19 DIAGNOSIS — R489 Unspecified symbolic dysfunctions: Secondary | ICD-10-CM | POA: Diagnosis not present

## 2014-10-19 DIAGNOSIS — J9383 Other pneumothorax: Secondary | ICD-10-CM | POA: Diagnosis not present

## 2014-10-19 DIAGNOSIS — R0602 Shortness of breath: Secondary | ICD-10-CM | POA: Diagnosis not present

## 2014-10-19 DIAGNOSIS — R262 Difficulty in walking, not elsewhere classified: Secondary | ICD-10-CM | POA: Diagnosis not present

## 2014-10-19 DIAGNOSIS — N189 Chronic kidney disease, unspecified: Secondary | ICD-10-CM | POA: Diagnosis not present

## 2014-10-19 DIAGNOSIS — J449 Chronic obstructive pulmonary disease, unspecified: Secondary | ICD-10-CM | POA: Diagnosis not present

## 2014-10-19 DIAGNOSIS — Z4682 Encounter for fitting and adjustment of non-vascular catheter: Secondary | ICD-10-CM | POA: Diagnosis not present

## 2014-10-19 DIAGNOSIS — I480 Paroxysmal atrial fibrillation: Secondary | ICD-10-CM | POA: Diagnosis not present

## 2014-10-19 NOTE — Progress Notes (Signed)
      [address]       [place] [address]             [phone]      Subjective:  Ms. Gunnerson has no new complaints.  She states she is not going to a SNF.  Her brother is going to provide care.  Objective: Vital signs in last 24 hours: Temp:  [98 F (36.7 C)-98.6 F (37 C)] 98.6 F (37 C) (08/06 0425) Pulse Rate:  [62-77] 62 (08/06 0425) Cardiac Rhythm:  [-] Normal sinus rhythm (08/05 2030) Resp:  [18-20] 18 (08/06 0425) BP: (140-159)/(51-68) 151/51 mmHg (08/06 0425) SpO2:  [96 %-98 %] 98 % (08/06 0854) Weight:  [113 lb 3.2 oz (51.347 kg)] 113 lb 3.2 oz (51.347 kg) (08/06 0425)  Intake/Output from previous day: 08/05 0701 - 08/06 0700 In: 960 [P.O.:960] Out: 2351 [Urine:2350; Stool:1] Intake/Output this shift: Total I/O In: -  Out: 300 [Urine:300]  General appearance: alert, cooperative and no distress Heart: irregularly irregular rhythm Lungs: clear to auscultation bilaterally Abdomen: soft, non-tender; bowel sounds normal; no masses,  no organomegaly Extremities: extremities normal, atraumatic, no cyanosis or edema Wound: clean and dry  Lab Results: No results for input(s): WBC, HGB, HCT, PLT in the last 72 hours. BMET: No results for input(s): NA, K, CL, CO2, GLUCOSE, BUN, CREATININE, CALCIUM in the last 72 hours.  PT/INR: No results for input(s): LABPROT, INR in the last 72 hours. ABG    Component Value Date/Time   TCO2 39 12/28/2013 1041   CBG (last 3)  No results for input(s): GLUCAP in the last 72 hours.  Assessment/Plan:  1. Chest tube- on Mini Express, no air leak, CXR is free from pneumothorax 2. Severe COPD- continue oxygen, home nebs, inhalers 3. CV- stable 4. Dispo- patient ready for discharge, refusing SNF will place CM orders   LOS: 10 days    Ahmed Prima, Phyllis Abelson 10/19/2014

## 2014-10-19 NOTE — Care Management Note (Addendum)
Case Management Note  Patient Details  Name: [name] [name] MRN: [id number] Date of Birth: September 17, 1940  Subjective/Objective:                   right spontaneous pneumothorax Action/Plan: Discharge planning  Expected Discharge Date:  10/19/14               Expected Discharge Plan:  [place]  In-House Referral:     Discharge planning Services  CM Consult  Post Acute Care Choice:  Home Health Choice offered to:     DME Arranged:  Oxygen (Has oxygen with [name] already, but needs a tank for discharge. ) DME Agency:  [place]:  RN [facility] Agency:  [place]  Status of Service:  Completed, signed off  Medicare Important Message Given:  Yes-third notification given Date Medicare IM Given:    Medicare IM give by:    Date Additional Medicare IM Given:    Additional Medicare Important Message give by:     If discussed at [place] of Stay Meetings, dates discussed:    Additional Comments: 11:56 AS CM is leaving unit, son of pt states pt now willing to go to SNF; confirmed by pt.  CM calls [facility] DME rep to cancel oxygen as pt will go by ambulance to SNF.  CM called [name] rep, [name] to cancel [facility].  CM notified RN of change in disposition.  CM called [name] Lorriane Shire to please arrange.  No other CM needs were communicated. CM called [name] of [name] to render [facility] as pt is refusing SNF.  CM called [place] [name] to notify of SNF refusal.  CM called [name] DME rep, Jeneen Rinks to please deliver a loaner tank to room as pt has [facility] providing home oxygen.  No other CM needs were communicated. Dellie Catholic, RN 10/19/2014, 11:48 AM

## 2014-10-19 NOTE — Clinical Social Work Placement (Signed)
   CLINICAL SOCIAL WORK PLACEMENT  NOTE  Date:  10/19/2014  Patient Details  Name: [name] [name] MRN: [id number] Date of Birth: November 01, 1940  Clinical Social Work is seeking post-discharge placement for this patient at the [place] level of care (*[name] will initial, date and re-position this form in  chart as items are completed):  Yes   Patient/family provided with [place] Work Department's list of facilities offering this level of care within the geographic area requested by the patient (or if unable, by the patient's family).  Yes   Patient/family informed of their freedom to choose among providers that offer the needed level of care, that participate in Medicare, Medicaid or managed care program needed by the patient, have an available bed and are willing to accept the patient.  Yes   Patient/family informed of [place]'s ownership interest in [facility] and [facility], as well as of the fact that they are under no obligation to receive care at these facilities.  PASRR submitted to EDS on 10/11/14     PASRR number received on 10/11/14     Existing PASRR number confirmed on       FL2 transmitted to all facilities in geographic area requested by pt/family on       FL2 transmitted to all facilities within larger geographic area on 10/11/14     Patient informed that his/her managed care company has contracts with or will negotiate with certain facilities, including the following:            Patient/family informed of bed offers received.  Patient chooses bed at  [facility] - See comment section below.    Physician recommends and patient chooses bed at      Patient to be transferred to  [facility] on  10/19/14.  Patient to be transferred to facility by  ambulance     Patient family notified on  10/19/14 of transfer.  Name of family member notified:   Cori Razor [phone])     PHYSICIAN        Additional Comment:  10/19/14 - Received [employer] authorization - [phone]. Darol Destine, case manager with [facility] contacted regarding change in facilities. [facility] unable to take patient due to chest tube. [facility] contacted and can accept patient today.   _______________________________________________ Sable Feil, LCSW 10/19/2014, 4:38 PM

## 2014-10-19 NOTE — Progress Notes (Signed)
Contacted by nursing staff stating patient is now willing to go to SNF.  I spoke with patient this morning and she was adamant she did not want to go to SNF.  I spoke with her son he stated that was fine.  The son presented to the patient's hospital room and must have spoke with her.  I was told by a nurse he was "cursing" at her.  The patient is of sound mind, however I have been told that [facility] is unwilling to take a patient with a chest tube.  I do not know if social work was not aware of plan, but [name] has been planning for patient to be discharged home with a chest tube since admission over a week ago.  Therefore, discharge plan needs to be clarified and if patient is now willing to go to SNF will need to find a facility that will accept patient with a chest tube.  She is medically stable for discharge.  Micahel Omlor PA-C

## 2014-10-19 NOTE — Progress Notes (Signed)
Received report from previous RN stating that the nurse at [facility] stated that they were unable to receive the pt with a chest tube in place.  I contacted the Sw on call and relayed this information, as well as the PA.  SW and nursing spoke with pt and she was agreeable to [facility] and will be discharged there today.  Pts son is aware and is in agreement with that plan.  Initially the pt had wanted to be discharged home, but has since changed her plan since she would have been there on her own.

## 2014-10-19 NOTE — Progress Notes (Signed)
Pt discharged to [facility] per md order.  IV d/Cd.  Tele D/cd.  Report was called to receiving RN at [facility] and she was informed regarding status of pt's chest tube.  Pt was transported with education on diet, activity, meds, and follow-up care and appointments.  Pt was transported by ambulance to SNF.

## 2014-10-21 ENCOUNTER — Ambulatory Visit: Payer: Commercial Managed Care - HMO

## 2014-10-21 DIAGNOSIS — J9311 Primary spontaneous pneumothorax: Secondary | ICD-10-CM | POA: Diagnosis not present

## 2014-10-21 DIAGNOSIS — J449 Chronic obstructive pulmonary disease, unspecified: Secondary | ICD-10-CM | POA: Diagnosis not present

## 2014-10-21 DIAGNOSIS — I2510 Atherosclerotic heart disease of native coronary artery without angina pectoris: Secondary | ICD-10-CM | POA: Diagnosis not present

## 2014-10-21 DIAGNOSIS — I480 Paroxysmal atrial fibrillation: Secondary | ICD-10-CM | POA: Diagnosis not present

## 2014-10-22 ENCOUNTER — Ambulatory Visit: Payer: Commercial Managed Care - HMO | Admitting: *Deleted

## 2014-10-23 ENCOUNTER — Other Ambulatory Visit: Payer: Self-pay | Admitting: Licensed Clinical Social Worker

## 2014-10-23 NOTE — Patient Outreach (Signed)
[facility]) Care Management  10/23/2014  [name] [name] Jul 13, 1940 [id number]   Assessment-[name] received referral for patient due to [facility] SNF admission. [name] attempted various times to get in touch with patient through facility and was successful in reaching patient on the third attempt. HIPPA verifications were received. [name] introduced self and explained reason for call as well as [facility] care management services. Patient remembered previously working with [facility] program. Patient reports that therapy is helping already and that she is hopeful to have chest tube removed. Patient was agreeable to set up initial visit on 10/25/14 but patient was unsure at what time would be suitable for her due to physical therapy. [name] agreed to contact therapy office to see what times would be available. Therapy office stated that any time after noon would be better. Patient was agreeable to visit at noon.   Plan-[name] will complete initial visit with patient at SNF on 10/25/14 at 12:00.  [name], BSW, MSW, [email or web address] Phone: [phone] Fax: [phone]

## 2014-10-25 ENCOUNTER — Other Ambulatory Visit: Payer: Self-pay | Admitting: Licensed Clinical Social Worker

## 2014-10-25 DIAGNOSIS — I480 Paroxysmal atrial fibrillation: Secondary | ICD-10-CM | POA: Diagnosis not present

## 2014-10-25 DIAGNOSIS — J449 Chronic obstructive pulmonary disease, unspecified: Secondary | ICD-10-CM | POA: Diagnosis not present

## 2014-10-25 DIAGNOSIS — J9311 Primary spontaneous pneumothorax: Secondary | ICD-10-CM | POA: Diagnosis not present

## 2014-10-25 DIAGNOSIS — I2510 Atherosclerotic heart disease of native coronary artery without angina pectoris: Secondary | ICD-10-CM | POA: Diagnosis not present

## 2014-10-25 NOTE — Patient Outreach (Signed)
[facility]) Care Management  10/25/2014  [name] [name] [birth year] [id number]   Assessment- [name] completed SNF visit with patient on 10/25/14 at [facility]. [name] introduced self and [facility] Care Management services as patient had forgot scheduled appointment and phone call earlier in the week. Patient was very pleasant and informative during session. [name] provided patient with new [facility] calendar. [name] contacted patient's son during session per patient's request. Patient's son [name] stated that he is familiar with [facility] and is appreciative of services. [name] stated that his only concern for her discharge home would be to have someone there to assist her as he can only come 1 day per week. [name] informed patient that [place] could be involved again and that she would look into it. [name] and patient completed psychosocial assessment. Patient reports that the only pain she is experiencing is from her chest tube. Patient has pneumothorax on right side. Patient reports that she plans of having chest tube removed at next doctor's appointment. Patient reports that she continues to use oxygen, cane, walker and nebulizer. Patient reports that she has struggles with transportation but refuses to use [facility]. [name] informed her that since her mother in law can take her to appointments on Mondays that it may be best to schedule all appointments on those days or to try [facility] services. Patient reports that she has changed her mind again and wishes to move back to [place] (goal she worked on with previous [place]) but is unsure if now would be the right time to do so. [name] informed her on community resources such as [place] and contacting [employer] to see if her [place] [employer] could [place] services to different county. [name] also contacted [employer] after session and left a message requesting call back. [name] informed patient that she would be on vacation during the  last week of August and that another [name] would be covering for me if [name] were to contact her during that time. Patient was agreeable to community resource goal for care plan.   [name] introduced self to [name] at [facility]. [name] Toney Sang stated that she can assist patient with setting up PCS and services with [place] to ensure a safe discharge. [name] provided card to [facility] and informed her that she would be in touch and could help assist in any way.   Plan-[name] will remain available to patient to further assist needs. [name] will be in contact with [name] Toney Sang to ensure home health once patient discharges.  [name], BSW, MSW, [email or web address] Phone: [phone] Fax: [phone]

## 2014-10-30 ENCOUNTER — Ambulatory Visit: Payer: Commercial Managed Care - HMO | Admitting: Cardiothoracic Surgery

## 2014-10-31 DIAGNOSIS — J9311 Primary spontaneous pneumothorax: Secondary | ICD-10-CM | POA: Diagnosis not present

## 2014-10-31 DIAGNOSIS — H60391 Other infective otitis externa, right ear: Secondary | ICD-10-CM | POA: Diagnosis not present

## 2014-11-06 ENCOUNTER — Ambulatory Visit
Admission: RE | Admit: 2014-11-06 | Discharge: 2014-11-06 | Disposition: A | Discharge status: Home / Self Care | Payer: Commercial Managed Care - HMO | Source: Ambulatory Visit | Attending: Cardiothoracic Surgery | Admitting: Cardiothoracic Surgery

## 2014-11-06 ENCOUNTER — Ambulatory Visit: Payer: Commercial Managed Care - HMO | Admitting: Cardiothoracic Surgery

## 2014-11-06 ENCOUNTER — Encounter: Payer: Self-pay | Admitting: Cardiothoracic Surgery

## 2014-11-06 ENCOUNTER — Ambulatory Visit (INDEPENDENT_AMBULATORY_CARE_PROVIDER_SITE_OTHER): Payer: Commercial Managed Care - HMO | Admitting: Cardiothoracic Surgery

## 2014-11-06 VITALS — BP 130/65 | HR 79 | Resp 20 | Ht 65.0 in | Wt 113.0 lb

## 2014-11-06 DIAGNOSIS — J9383 Other pneumothorax: Secondary | ICD-10-CM

## 2014-11-06 DIAGNOSIS — J939 Pneumothorax, unspecified: Secondary | ICD-10-CM

## 2014-11-06 NOTE — Progress Notes (Signed)
PCP is Leonides Sake, MD Referring Provider is Hamrick, Lorin Mercy, MD  Chief Complaint  Patient presents with  . 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 Patient has  been at the [facility] rehabilitation center for the past 3 weeks.she returns now with chest x-ray. She has no pulmonary complaints. Chest x-ray shows resolution of pneumothorax with the catheter in a subpulmonic position.  Past Medical History  Diagnosis Date  . Hypertension   . Shortness of breath   . Asthma   . COPD (chronic obstructive pulmonary disease)     emphysema    . Pneumonia     hx pneumonia ,chronic bronchitis   . H/O blood clots   . Chronic kidney disease     current   UTI   being treated  . GERD (gastroesophageal reflux disease)   . Headache(784.0)     hx migraines  . Blood dyscrasia     hx blood clotts  . Arthritis   . Oxygen deficiency   . Hyperlipidemia   . Blood transfusion without reported diagnosis   . Anemia   . Pneumothorax on right 09/2014    Past Surgical History  Procedure Laterality Date  . Right leg    . Hip fracture surgery      RIGHT SIDE  . Shoulder surgery      RIGHT  . Cholecystectomy    . Eye surgery      BIL CATARACT   . Orif wrist fracture  10/13/2011    Procedure: OPEN REDUCTION INTERNAL FIXATION (ORIF) WRIST FRACTURE;  Surgeon: [name], MD;  Location: [facility];  Service: Orthopedics;  Laterality: Right;  Open Reduction Internal Fixation Right Distal Radius   . Abdominal hysterectomy    . Btl    . Hardware removal  01/29/2012    Procedure: HARDWARE REMOVAL;  Surgeon: [name], MD;  Location: [place] [facility];  Service: Orthopedics;  Laterality: Right;  . Hip arthroplasty  01/29/2012    Procedure: ARTHROPLASTY BIPOLAR HIP;  Surgeon: [name], MD;  Location: [facility];  Service: Orthopedics;  Laterality: Right;  . Cardiac  catheterization    . Transurethral resection of bladder tumor N/A 06/11/2014    Procedure: TRANSURETHRAL RESECTION OF BLADDER TUMOR (TURBT);  Surgeon: [name], MD;  Location: [facility];  Service: Urology;  Laterality: N/A;  . Cystoscopy w/ retrogrades Bilateral 06/11/2014    Procedure: CYSTOSCOPY WITH RETROGRADE PYELOGRAM;  Surgeon: [name], MD;  Location: [facility];  Service: Urology;  Laterality: Bilateral;    Family History  Problem Relation Age of Onset  . Cancer - Lung Mother   . Coronary artery disease Mother   . Coronary artery disease Sister   . Coronary artery disease Brother     Social History Social History  Substance Use Topics  . Smoking status: Former Smoker -- 1.00 packs/day for 50 years    Types: Cigarettes    Quit date: 10/27/2013  . Smokeless tobacco: Never Used  . Alcohol Use: No    Current Outpatient Prescriptions  Medication Sig Dispense Refill  . albuterol (PROVENTIL) (2.5 MG/3ML) 0.083% nebulizer solution Take 2.5 mg by nebulization every 6 (six) hours as needed for wheezing or shortness of breath.     Marland Kitchen atorvastatin (LIPITOR) 40 MG tablet Take 40 mg by mouth every morning.     . budesonide-formoterol (SYMBICORT)  160-4.5 MCG/ACT inhaler Inhale 2 puffs into the lungs 2 (two) times daily. 1 Inhaler 12  . digoxin (LANOXIN) 0.25 MG tablet Take 0.125 mg by mouth every morning.     . diltiazem (CARDIZEM CD) 240 MG 24 hr capsule Take 240 mg by mouth every morning.   1  . docusate sodium (COLACE) 100 MG capsule Take 100 mg by mouth daily as needed (constipation).     . Ferrous Sulfate (IRON) 325 (65 FE) MG TABS Take 325 mg by mouth daily.    . fluticasone (FLONASE) 50 MCG/ACT nasal spray Place 1 spray into both nostrils daily as needed for allergies or rhinitis.    . furosemide (LASIX) 20 MG tablet Take 1 tablet (20 mg total) by mouth daily. 30 tablet 1  . iron polysaccharides (NIFEREX) 150 MG capsule Take 1 capsule (150 mg total) by mouth daily. 60 capsule 1   . metoprolol succinate (TOPROL-XL) 50 MG 24 hr tablet Take 12.5 mg by mouth daily. Take with or immediately following a meal.    . oxybutynin (DITROPAN-XL) 5 MG 24 hr tablet Take 10 mg by mouth at bedtime.    . pantoprazole (PROTONIX) 40 MG tablet Take 1 tablet (40 mg total) by mouth daily. 30 tablet 0  . rivaroxaban (XARELTO) 20 MG TABS tablet Take 20 mg by mouth daily with supper.    . tiotropium (SPIRIVA) 18 MCG inhalation capsule Place 1 capsule (18 mcg total) into inhaler and inhale daily. 30 capsule 12  . traMADol (ULTRAM) 50 MG tablet Take 1 tablet (50 mg total) by mouth every 6 (six) hours as needed for moderate pain (pain). (Patient taking differently: Take 50 mg by mouth at bedtime as needed for moderate pain (pain). ) 30 tablet 0  . VENTOLIN HFA 108 (90 BASE) MCG/ACT inhaler Inhale 2 puffs into the lungs 4 (four) times daily as needed for wheezing or shortness of breath.     . vitamin B-12 (CYANOCOBALAMIN) 500 MCG tablet Take 1,000 mcg by mouth daily.      No current facility-administered medications for this visit.    No Known Allergies  Review of Systems  No fever or productive cough Patient is ambulating daily in the rehabilitation center BP 130/65 mmHg  Pulse 79  Resp 20  Ht 5\' 5"  (1.651 m)  Wt 113 lb (51.256 kg)  BMI 18.80 kg/m2  SpO2 92% Physical Exam Distant breath sounds with scattered rhonchi bilaterally Heart rhythm regular No edema Neuro intact No evidence  Of airleak through tube when  patient coughs  Diagnostic Tests: Chest x-ray taken today personally reviewed  Impression:resolution of spontaneous pneumothorax and airleak Chest tube was removed and occlusive dressing applied  Plan:patient will return to [facility] rehabilitation but could be discharged home in 48 hours if she remains stable. She will return here with a followup chest x-ray in 3 weeks next month. Occlusive dressing   to  remain on 48 hours.   Len Childs, MD [facility] [phone] And and and and and and and and a

## 2014-11-07 DIAGNOSIS — J9311 Primary spontaneous pneumothorax: Secondary | ICD-10-CM | POA: Diagnosis not present

## 2014-11-07 DIAGNOSIS — I10 Essential (primary) hypertension: Secondary | ICD-10-CM | POA: Diagnosis not present

## 2014-11-07 DIAGNOSIS — I480 Paroxysmal atrial fibrillation: Secondary | ICD-10-CM | POA: Diagnosis not present

## 2014-11-07 DIAGNOSIS — J449 Chronic obstructive pulmonary disease, unspecified: Secondary | ICD-10-CM | POA: Diagnosis not present

## 2014-11-08 ENCOUNTER — Other Ambulatory Visit: Payer: Self-pay | Admitting: Licensed Clinical Social Worker

## 2014-11-08 NOTE — Patient Outreach (Signed)
[facility]) Care Management  11/08/2014  [name] [name] Aug 17, 1940 [id number]   Assessment-[name] contacted Toney Sang, social worker at [facility] on 11/08/14 to discuss patient's discharge plan. [name] received HIPPA verifications. Lattie Haw stated that patient's chest tube was taken out this past Wednesday and per doctor's recommendation, she is able to successfully discharge from SNF. Lattie Haw reported that patient is discharging from SNF today on 11/08/14 and will be returning home. Lattie Haw stated that she has successfully set up home health for patient and has also set up for a "MSW to visit her at home to set up personal care services and assess needs." [name] thanked Lattie Haw for her time and services. [name] contacted patient's son and left a HIPPA compliant voice message encouraging him to return call to [place] by today as [name] will be out of the office next week.  Plan-[name] will inform RNCM Joylene Draft of patient's SNF discharge. [name] will remain available to patient and patient's family for any further needs.  [name], BSW, MSW, [email or web address] Phone: [phone] Fax: [phone]

## 2014-11-11 ENCOUNTER — Other Ambulatory Visit: Payer: Self-pay | Admitting: *Deleted

## 2014-11-11 DIAGNOSIS — Z48813 Encounter for surgical aftercare following surgery on the respiratory system: Secondary | ICD-10-CM | POA: Diagnosis not present

## 2014-11-11 DIAGNOSIS — D649 Anemia, unspecified: Secondary | ICD-10-CM | POA: Diagnosis not present

## 2014-11-11 DIAGNOSIS — I480 Paroxysmal atrial fibrillation: Secondary | ICD-10-CM | POA: Diagnosis not present

## 2014-11-11 DIAGNOSIS — Z9981 Dependence on supplemental oxygen: Secondary | ICD-10-CM | POA: Diagnosis not present

## 2014-11-11 DIAGNOSIS — N189 Chronic kidney disease, unspecified: Secondary | ICD-10-CM | POA: Diagnosis not present

## 2014-11-11 DIAGNOSIS — I2510 Atherosclerotic heart disease of native coronary artery without angina pectoris: Secondary | ICD-10-CM | POA: Diagnosis not present

## 2014-11-11 DIAGNOSIS — M1990 Unspecified osteoarthritis, unspecified site: Secondary | ICD-10-CM | POA: Diagnosis not present

## 2014-11-11 DIAGNOSIS — J441 Chronic obstructive pulmonary disease with (acute) exacerbation: Secondary | ICD-10-CM | POA: Diagnosis not present

## 2014-11-11 DIAGNOSIS — I129 Hypertensive chronic kidney disease with stage 1 through stage 4 chronic kidney disease, or unspecified chronic kidney disease: Secondary | ICD-10-CM | POA: Diagnosis not present

## 2014-11-11 NOTE — Patient Outreach (Signed)
[facility]) Care Management  11/11/2014  [name] [name] October 01, 1940 [id number]   Transition of care ReneeParks was discharged from [facility] skilled nursing facility on Saturday, 8/27 to return to her apartment in [place]. Patient reports that she is glad to be back home, states that she is stronger. ReneeParks reports that care [facility] has visited her on today, and changed the dressing at her right side of previous chest tube site. Patient denies shortness of breath, increased cough or pain. Reports that she is taking all of her medications per instructions, but is unable at this time to review complete list with me. [name] [name] reports that her son Ardine Bjork is going to make her 2 week follow up  appointment with Dr.Hamrick when he can arrange transportation.  Plan. Will schedule home visit on September 6 at [place], [place], [place] Management [phone].

## 2014-11-12 DIAGNOSIS — I480 Paroxysmal atrial fibrillation: Secondary | ICD-10-CM | POA: Diagnosis not present

## 2014-11-12 DIAGNOSIS — Z48813 Encounter for surgical aftercare following surgery on the respiratory system: Secondary | ICD-10-CM | POA: Diagnosis not present

## 2014-11-12 DIAGNOSIS — Z9981 Dependence on supplemental oxygen: Secondary | ICD-10-CM | POA: Diagnosis not present

## 2014-11-12 DIAGNOSIS — J441 Chronic obstructive pulmonary disease with (acute) exacerbation: Secondary | ICD-10-CM | POA: Diagnosis not present

## 2014-11-12 DIAGNOSIS — D649 Anemia, unspecified: Secondary | ICD-10-CM | POA: Diagnosis not present

## 2014-11-12 DIAGNOSIS — M1990 Unspecified osteoarthritis, unspecified site: Secondary | ICD-10-CM | POA: Diagnosis not present

## 2014-11-12 DIAGNOSIS — N189 Chronic kidney disease, unspecified: Secondary | ICD-10-CM | POA: Diagnosis not present

## 2014-11-12 DIAGNOSIS — I129 Hypertensive chronic kidney disease with stage 1 through stage 4 chronic kidney disease, or unspecified chronic kidney disease: Secondary | ICD-10-CM | POA: Diagnosis not present

## 2014-11-12 DIAGNOSIS — I2510 Atherosclerotic heart disease of native coronary artery without angina pectoris: Secondary | ICD-10-CM | POA: Diagnosis not present

## 2014-11-13 ENCOUNTER — Ambulatory Visit: Payer: Commercial Managed Care - HMO | Admitting: Cardiothoracic Surgery

## 2014-11-13 DIAGNOSIS — J441 Chronic obstructive pulmonary disease with (acute) exacerbation: Secondary | ICD-10-CM | POA: Diagnosis not present

## 2014-11-13 DIAGNOSIS — I480 Paroxysmal atrial fibrillation: Secondary | ICD-10-CM | POA: Diagnosis not present

## 2014-11-13 DIAGNOSIS — M1990 Unspecified osteoarthritis, unspecified site: Secondary | ICD-10-CM | POA: Diagnosis not present

## 2014-11-13 DIAGNOSIS — Z48813 Encounter for surgical aftercare following surgery on the respiratory system: Secondary | ICD-10-CM | POA: Diagnosis not present

## 2014-11-13 DIAGNOSIS — D649 Anemia, unspecified: Secondary | ICD-10-CM | POA: Diagnosis not present

## 2014-11-13 DIAGNOSIS — Z9981 Dependence on supplemental oxygen: Secondary | ICD-10-CM | POA: Diagnosis not present

## 2014-11-13 DIAGNOSIS — I129 Hypertensive chronic kidney disease with stage 1 through stage 4 chronic kidney disease, or unspecified chronic kidney disease: Secondary | ICD-10-CM | POA: Diagnosis not present

## 2014-11-13 DIAGNOSIS — N189 Chronic kidney disease, unspecified: Secondary | ICD-10-CM | POA: Diagnosis not present

## 2014-11-13 DIAGNOSIS — I2510 Atherosclerotic heart disease of native coronary artery without angina pectoris: Secondary | ICD-10-CM | POA: Diagnosis not present

## 2014-11-15 DIAGNOSIS — Z48813 Encounter for surgical aftercare following surgery on the respiratory system: Secondary | ICD-10-CM | POA: Diagnosis not present

## 2014-11-15 DIAGNOSIS — J441 Chronic obstructive pulmonary disease with (acute) exacerbation: Secondary | ICD-10-CM | POA: Diagnosis not present

## 2014-11-15 DIAGNOSIS — D649 Anemia, unspecified: Secondary | ICD-10-CM | POA: Diagnosis not present

## 2014-11-15 DIAGNOSIS — I129 Hypertensive chronic kidney disease with stage 1 through stage 4 chronic kidney disease, or unspecified chronic kidney disease: Secondary | ICD-10-CM | POA: Diagnosis not present

## 2014-11-15 DIAGNOSIS — I480 Paroxysmal atrial fibrillation: Secondary | ICD-10-CM | POA: Diagnosis not present

## 2014-11-15 DIAGNOSIS — J449 Chronic obstructive pulmonary disease, unspecified: Secondary | ICD-10-CM | POA: Diagnosis not present

## 2014-11-15 DIAGNOSIS — I2510 Atherosclerotic heart disease of native coronary artery without angina pectoris: Secondary | ICD-10-CM | POA: Diagnosis not present

## 2014-11-15 DIAGNOSIS — M1990 Unspecified osteoarthritis, unspecified site: Secondary | ICD-10-CM | POA: Diagnosis not present

## 2014-11-15 DIAGNOSIS — Z9981 Dependence on supplemental oxygen: Secondary | ICD-10-CM | POA: Diagnosis not present

## 2014-11-15 DIAGNOSIS — N189 Chronic kidney disease, unspecified: Secondary | ICD-10-CM | POA: Diagnosis not present

## 2014-11-19 ENCOUNTER — Other Ambulatory Visit: Payer: Self-pay | Admitting: *Deleted

## 2014-11-19 DIAGNOSIS — I129 Hypertensive chronic kidney disease with stage 1 through stage 4 chronic kidney disease, or unspecified chronic kidney disease: Secondary | ICD-10-CM | POA: Diagnosis not present

## 2014-11-19 DIAGNOSIS — Z48813 Encounter for surgical aftercare following surgery on the respiratory system: Secondary | ICD-10-CM | POA: Diagnosis not present

## 2014-11-19 DIAGNOSIS — I2510 Atherosclerotic heart disease of native coronary artery without angina pectoris: Secondary | ICD-10-CM | POA: Diagnosis not present

## 2014-11-19 DIAGNOSIS — J441 Chronic obstructive pulmonary disease with (acute) exacerbation: Secondary | ICD-10-CM | POA: Diagnosis not present

## 2014-11-19 DIAGNOSIS — Z9981 Dependence on supplemental oxygen: Secondary | ICD-10-CM | POA: Diagnosis not present

## 2014-11-19 DIAGNOSIS — D649 Anemia, unspecified: Secondary | ICD-10-CM | POA: Diagnosis not present

## 2014-11-19 DIAGNOSIS — N189 Chronic kidney disease, unspecified: Secondary | ICD-10-CM | POA: Diagnosis not present

## 2014-11-19 DIAGNOSIS — I480 Paroxysmal atrial fibrillation: Secondary | ICD-10-CM | POA: Diagnosis not present

## 2014-11-19 DIAGNOSIS — M1990 Unspecified osteoarthritis, unspecified site: Secondary | ICD-10-CM | POA: Diagnosis not present

## 2014-11-19 NOTE — Patient Outreach (Signed)
[facility]) Care Management   11/19/2014  [name] [name] 07/30/1940 [id number]  [name] [name] is an 74 y.o. female  Subjective: "I'm glad to be at home", but I feel like I'm catching a cold" Denies increased shortness of breath,  States she has a cough with clear secretions, complaint of scratchy throat and complains that she has been sneezing. [name] [name] report that she is taking all of her medication as prescribed.  Objective:   Review of Systems  Constitutional: Negative.  Negative for fever and weight loss.       Occasionally feels hot then cold, "I feel like I'm getting a cold"  HENT: Negative.   Eyes: Negative.   Respiratory: Positive for cough and sputum production. Negative for wheezing.        Clear secretions, no increase in cough, reports occasional sneezing  Cardiovascular: Negative for chest pain.  Genitourinary: Positive for frequency. Negative for dysuria.  Musculoskeletal: Negative.  Negative for falls.  Skin: Negative.   Neurological: Negative.   Psychiatric/Behavioral: Negative.     Physical Exam  Constitutional: She is oriented to person, place, and time. She appears well-developed.  Cardiovascular: Normal rate and regular rhythm.   Respiratory: Effort normal. No respiratory distress. She has no wheezes. She has no rales.  Decreased breath sounds  GI: Soft.  Neurological: She is alert and oriented to person, place, and time.  Skin: Skin is warm and dry.  Dry dressing at Right side, site of previous chest tube.  Psychiatric: She has a normal mood and affect.   BP 140/62 mmHg  Pulse 90  Temp(Src) 98 F (36.7 C) (Oral)  Resp 20  SpO2 96% Current Medications:   Current Outpatient Prescriptions  Medication Sig Dispense Refill  . albuterol (PROVENTIL) (2.5 MG/3ML) 0.083% nebulizer solution Take 2.5 mg by nebulization every 6 (six) hours as needed for wheezing or shortness of breath.     Marland Kitchen atorvastatin (LIPITOR) 40 MG tablet Take 40 mg by  mouth every morning.     . budesonide-formoterol (SYMBICORT) 160-4.5 MCG/ACT inhaler Inhale 2 puffs into the lungs 2 (two) times daily. 1 Inhaler 12  . digoxin (LANOXIN) 0.25 MG tablet Take 0.125 mg by mouth every morning.     . diltiazem (CARDIZEM CD) 240 MG 24 hr capsule Take 240 mg by mouth every morning.   1  . docusate sodium (COLACE) 100 MG capsule Take 100 mg by mouth daily as needed (constipation).     . fluticasone (FLONASE) 50 MCG/ACT nasal spray Place 1 spray into both nostrils daily as needed for allergies or rhinitis.    . furosemide (LASIX) 20 MG tablet Take 1 tablet (20 mg total) by mouth daily. 30 tablet 1  . iron polysaccharides (NIFEREX) 150 MG capsule Take 1 capsule (150 mg total) by mouth daily. 60 capsule 1  . metoprolol succinate (TOPROL-XL) 50 MG 24 hr tablet Take 12.5 mg by mouth daily. Take with or immediately following a meal.    . Omega-3 Fatty Acids (FISH OIL) 1000 MG CAPS Take 1,000 capsules by mouth daily.    Marland Kitchen oxybutynin (DITROPAN-XL) 5 MG 24 hr tablet Take 10 mg by mouth at bedtime.    . pantoprazole (PROTONIX) 40 MG tablet Take 1 tablet (40 mg total) by mouth daily. 30 tablet 0  . rivaroxaban (XARELTO) 20 MG TABS tablet Take 20 mg by mouth daily with supper.    . tiotropium (SPIRIVA) 18 MCG inhalation capsule Place 1 capsule (18 mcg total) into  inhaler and inhale daily. 30 capsule 12  . traMADol (ULTRAM) 50 MG tablet Take 1 tablet (50 mg total) by mouth every 6 (six) hours as needed for moderate pain (pain). (Patient taking differently: Take 50 mg by mouth at bedtime as needed for moderate pain (pain). ) 30 tablet 0  . VENTOLIN HFA 108 (90 BASE) MCG/ACT inhaler Inhale 2 puffs into the lungs 4 (four) times daily as needed for wheezing or shortness of breath.     . vitamin B-12 (CYANOCOBALAMIN) 500 MCG tablet Take 1,000 mcg by mouth daily.     . Ferrous Sulfate (IRON) 325 (65 FE) MG TABS Take 325 mg by mouth daily.     No current facility-administered medications for  this visit.    Functional Status:   In your present state of health, do you have any difficulty performing the following activities: 11/19/2014 10/09/2014  Hearing? [name] [name]  Vision? N N  Difficulty concentrating or making decisions? N Y  Walking or climbing stairs? Y Y  Dressing or bathing? Y N  Doing errands, shopping? [name] [name]  Preparing Food and eating ? N -  Using the Toilet? N -  In the past six months, have you accidently leaked urine? Y -  Do you have problems with loss of bowel control? N -  Managing your Medications? N -  Managing your Finances? N -  Housekeeping or managing your Housekeeping? Y -    Fall/Depression Screening:    PHQ 2/9 Scores 11/19/2014 10/25/2014 10/07/2014 10/02/2014 09/20/2014 06/20/2014  PHQ - 2 Score 0 0 0 0 0 0    Assessment: [name] [name] complaint of not feeling well today, no respiratory distress noted, she is wearing her oxygen at 4 liters, on last visit noted oxygen at 2 liters, patient states her son [name] [name] increased oxygen to 4 liters on her return home from rehab on 8/27, reports she breaths better with it at that amount and that she was going to ask doctor for a order to leave it at that.  [name] [name] is afebrile,denies chills during visit. [name] [name] is very informed about her medication and continues to use a daily pill box, . She requested my assistance in calling in a refill for her albuterol for her nebulizer, she is down to 1 pack 5, vials. I called [facility] and they will deliver to her home by 4 PM today, patient aware. [name] [name] is followed by [facility] RN and Physical therapy, patient reports that she is doing good with her therapy, she reports that the RN changed the dressing Friday, she questions if RN is coming today, I [facility] and RN is scheduled to visit today again.  [name] [name] has a follow up appointment with Dr.Hamrick on Monday,September 12, she states her daughter in law will provide transportation. Reminded [name] [name] of her appointment  also at [place] office on September 14, she was unsure about how she is going to get to that appointment, she gave me verbal permission to discuss it with her son when he gets off work today, as well as how she is feeling today,and transportation concerns if she needs a earlier PCP appointment. [name] [name]'s declines using [employer] for transportation. I spoke with [name] [name]'s son Ardine Bjork to remind him of the patient appointment in [place] on 9/14, he stated that he would work it out and probably use the [employer]. I notified Dr.Hamrick's office today, spoke with nurse to communicate how [name] [name] is feeling today ,concern if possible need for earlier PCP appointment  and to report the amount of oxygen that the patient is now using explained that I did not change the oxygen level that she has been on more than a week, will await any MD recommendations Discussed with [name] [name] to notify PCP if she has increased shortness of breath, change in her sputum, if she has a  Temperature.  [name] [name] discussed her wishes to move to housing in [place] area to be closer to her son,brother and doctors, and she asked about if with her medicaid could she get personal care assistance in home.   Plan:  Will continue weekly  transition of care calls and visit sooner is needed. I will communicate with Dickie La, LCSW regarding patient concern regarding housing and personal care service.   [name] CM Care Plan Problem One        Most Recent Value   Care Plan Problem One  Adherence to COPD action plan   Role Documenting the Problem One  Care Management Coordinator   Care Plan for Problem One  Not Active   [name] Long Term Goal (31-90 days)  Patient will be able to follow the COPD action plan and notify MD for symptoms in the yellow Zones in the next 31 days   [name] Long Term Goal Start Date  05/24/14   [name] Long Term Goal Met Date  07/30/14   [name] CM Short Term Goal #1 (0-30 days)  Patient will be able to  state the symptoms  in the yellow zone that she should call the MD about in the next 30 days   [name] CM Short Term Goal #1 Start Date  05/24/14   [facility] CM Short Term Goal #1 Met Date  06/20/14   [name] CM Short Term Goal #2 (0-30 days)  patient will review the COPD material within the next 30 days    [name] CM Short Term Goal #2 Start Date  05/24/14   [facility] CM Short Term Goal #2 Met Date  06/20/14    [facility] CM Care Plan Problem Two        Most Recent Value   Care Plan Problem Two  Fall Risk   Role Documenting the Problem Two  Care Management Coordinator   Care Plan for Problem Two  Not Active   [name] Long Term Goal (31-90) days  Patient will not experience a fall in the next 31 days   [name] Long Term Goal Start Date  05/24/14   [name] Long Term Goal Met Date  06/20/14   [name] CM Short Term Goal #1 (0-30 days)  Patient will use safety equipment consistenly within the next 30 days to decrease fall   [name] CM Short Term Goal #1 Start Date  05/24/14   [place] CM Short Term Goal #1 Met Date   06/20/14   [name] CM Short Term Goal #2 (0-30 days)  Pt will discuss  with MD on next appointment about physical therapy for strengthen   [facility] CM Short Term Goal #2 Start Date  05/24/14   [facility] CM Short Term Goal #2 Met Date  06/20/14    [facility] CM Care Plan Problem Three        Most Recent Value   Care Plan Problem Three  Frequent Hospital Admissions   Role Documenting the Problem Three  Care Management Coordinator   Care Plan for Problem Three  Active   [name] Long Term Goal (31-90) days  Patient will not have a hospital admisssion in the next 31 days  [name] Long Term Goal Start Date  11/11/14   Interventions for Problem Three Long Term Goal  Will discuss transportation concerns with her son Ardine Bjork to keep [place] MD appointment   [facility] CM Short Term Goal #1 (0-30 days)  Patient will not experience  hospital admission in the next 30 days   [name] CM Short Term Goal #1 Start Date  11/11/14   Interventions for Short Term Goal #1  Notified  PCP of concerns,arrange earlier appointment if warranted.     Joylene Draft, RN, [place] Care Management [phone]

## 2014-11-20 ENCOUNTER — Inpatient Hospital Stay (HOSPITAL_COMMUNITY)
Admission: EM | Admit: 2014-11-20 | Discharge: 2014-11-28 | DRG: 200 | Disposition: A | Discharge status: Home Health | Payer: Commercial Managed Care - HMO | Attending: Cardiothoracic Surgery | Admitting: Cardiothoracic Surgery

## 2014-11-20 ENCOUNTER — Other Ambulatory Visit: Payer: Self-pay | Admitting: *Deleted

## 2014-11-20 ENCOUNTER — Emergency Department (HOSPITAL_COMMUNITY): Payer: Commercial Managed Care - HMO

## 2014-11-20 DIAGNOSIS — K219 Gastro-esophageal reflux disease without esophagitis: Secondary | ICD-10-CM | POA: Diagnosis present

## 2014-11-20 DIAGNOSIS — R51 Headache: Secondary | ICD-10-CM

## 2014-11-20 DIAGNOSIS — J939 Pneumothorax, unspecified: Secondary | ICD-10-CM | POA: Diagnosis present

## 2014-11-20 DIAGNOSIS — G43909 Migraine, unspecified, not intractable, without status migrainosus: Secondary | ICD-10-CM | POA: Diagnosis present

## 2014-11-20 DIAGNOSIS — J45909 Unspecified asthma, uncomplicated: Secondary | ICD-10-CM | POA: Diagnosis present

## 2014-11-20 DIAGNOSIS — Z9981 Dependence on supplemental oxygen: Secondary | ICD-10-CM | POA: Diagnosis not present

## 2014-11-20 DIAGNOSIS — R0602 Shortness of breath: Secondary | ICD-10-CM | POA: Diagnosis not present

## 2014-11-20 DIAGNOSIS — J9311 Primary spontaneous pneumothorax: Secondary | ICD-10-CM | POA: Diagnosis not present

## 2014-11-20 DIAGNOSIS — J95811 Postprocedural pneumothorax: Secondary | ICD-10-CM | POA: Diagnosis not present

## 2014-11-20 DIAGNOSIS — M1990 Unspecified osteoarthritis, unspecified site: Secondary | ICD-10-CM | POA: Diagnosis not present

## 2014-11-20 DIAGNOSIS — Z9689 Presence of other specified functional implants: Secondary | ICD-10-CM

## 2014-11-20 DIAGNOSIS — J441 Chronic obstructive pulmonary disease with (acute) exacerbation: Secondary | ICD-10-CM | POA: Diagnosis not present

## 2014-11-20 DIAGNOSIS — J449 Chronic obstructive pulmonary disease, unspecified: Secondary | ICD-10-CM | POA: Diagnosis present

## 2014-11-20 DIAGNOSIS — Z96641 Presence of right artificial hip joint: Secondary | ICD-10-CM | POA: Diagnosis present

## 2014-11-20 DIAGNOSIS — Z48813 Encounter for surgical aftercare following surgery on the respiratory system: Secondary | ICD-10-CM | POA: Diagnosis not present

## 2014-11-20 DIAGNOSIS — D649 Anemia, unspecified: Secondary | ICD-10-CM | POA: Diagnosis not present

## 2014-11-20 DIAGNOSIS — R079 Chest pain, unspecified: Secondary | ICD-10-CM | POA: Diagnosis not present

## 2014-11-20 DIAGNOSIS — Z86711 Personal history of pulmonary embolism: Secondary | ICD-10-CM

## 2014-11-20 DIAGNOSIS — N189 Chronic kidney disease, unspecified: Secondary | ICD-10-CM | POA: Diagnosis present

## 2014-11-20 DIAGNOSIS — R519 Headache, unspecified: Secondary | ICD-10-CM

## 2014-11-20 DIAGNOSIS — J9383 Other pneumothorax: Secondary | ICD-10-CM | POA: Diagnosis not present

## 2014-11-20 DIAGNOSIS — I2510 Atherosclerotic heart disease of native coronary artery without angina pectoris: Secondary | ICD-10-CM | POA: Diagnosis not present

## 2014-11-20 DIAGNOSIS — Z8701 Personal history of pneumonia (recurrent): Secondary | ICD-10-CM | POA: Diagnosis not present

## 2014-11-20 DIAGNOSIS — I480 Paroxysmal atrial fibrillation: Secondary | ICD-10-CM | POA: Diagnosis not present

## 2014-11-20 DIAGNOSIS — Z4682 Encounter for fitting and adjustment of non-vascular catheter: Secondary | ICD-10-CM | POA: Diagnosis not present

## 2014-11-20 DIAGNOSIS — R042 Hemoptysis: Secondary | ICD-10-CM

## 2014-11-20 DIAGNOSIS — Z7951 Long term (current) use of inhaled steroids: Secondary | ICD-10-CM

## 2014-11-20 DIAGNOSIS — I129 Hypertensive chronic kidney disease with stage 1 through stage 4 chronic kidney disease, or unspecified chronic kidney disease: Secondary | ICD-10-CM | POA: Diagnosis present

## 2014-11-20 DIAGNOSIS — E785 Hyperlipidemia, unspecified: Secondary | ICD-10-CM | POA: Diagnosis present

## 2014-11-20 DIAGNOSIS — Z87891 Personal history of nicotine dependence: Secondary | ICD-10-CM

## 2014-11-20 DIAGNOSIS — N390 Urinary tract infection, site not specified: Secondary | ICD-10-CM | POA: Diagnosis not present

## 2014-11-20 DIAGNOSIS — T8182XA Emphysema (subcutaneous) resulting from a procedure, initial encounter: Secondary | ICD-10-CM | POA: Diagnosis not present

## 2014-11-20 DIAGNOSIS — D509 Iron deficiency anemia, unspecified: Secondary | ICD-10-CM | POA: Diagnosis present

## 2014-11-20 HISTORY — DX: Migraine, unspecified, not intractable, without status migrainosus: G43.909

## 2014-11-20 HISTORY — DX: Personal history of other medical treatment: Z92.89

## 2014-11-20 HISTORY — DX: Other chronic pain: G89.29

## 2014-11-20 HISTORY — DX: Low back pain: M54.5

## 2014-11-20 HISTORY — DX: Unspecified urinary incontinence: R32

## 2014-11-20 HISTORY — DX: Unspecified chronic bronchitis: J42

## 2014-11-20 HISTORY — DX: Low back pain, unspecified: M54.50

## 2014-11-20 HISTORY — DX: Other pulmonary embolism without acute cor pulmonale: I26.99

## 2014-11-20 HISTORY — DX: Dependence on supplemental oxygen: Z99.81

## 2014-11-20 LAB — CBC WITH DIFFERENTIAL/PLATELET
Basophils Absolute: 0 10*3/uL (ref 0.0–0.1)
Basophils Relative: 0 % (ref 0–1)
EOS ABS: 0.1 10*3/uL (ref 0.0–0.7)
Eosinophils Relative: 2 % (ref 0–5)
HCT: 33.6 % — ABNORMAL LOW (ref 36.0–46.0)
Hemoglobin: 10 g/dL — ABNORMAL LOW (ref 12.0–15.0)
LYMPHS PCT: 21 % (ref 12–46)
Lymphs Abs: 1 10*3/uL (ref 0.7–4.0)
MCH: 26.3 pg (ref 26.0–34.0)
MCHC: 29.8 g/dL — ABNORMAL LOW (ref 30.0–36.0)
MCV: 88.4 fL (ref 78.0–100.0)
Monocytes Absolute: 0.6 10*3/uL (ref 0.1–1.0)
Monocytes Relative: 13 % — ABNORMAL HIGH (ref 3–12)
Neutro Abs: 2.9 10*3/uL (ref 1.7–7.7)
Neutrophils Relative %: 64 % (ref 43–77)
PLATELETS: 76 10*3/uL — AB (ref 150–400)
RBC: 3.8 MIL/uL — AB (ref 3.87–5.11)
RDW: 16.6 % — ABNORMAL HIGH (ref 11.5–15.5)
WBC: 4.6 10*3/uL (ref 4.0–10.5)

## 2014-11-20 LAB — COMPREHENSIVE METABOLIC PANEL
ALT: 11 U/L — AB (ref 14–54)
AST: 15 U/L (ref 15–41)
Albumin: 3.2 g/dL — ABNORMAL LOW (ref 3.5–5.0)
Alkaline Phosphatase: 61 U/L (ref 38–126)
Anion gap: 7 (ref 5–15)
BUN: 8 mg/dL (ref 6–20)
CHLORIDE: 98 mmol/L — AB (ref 101–111)
CO2: 36 mmol/L — AB (ref 22–32)
CREATININE: 0.61 mg/dL (ref 0.44–1.00)
Calcium: 9.4 mg/dL (ref 8.9–10.3)
GFR calc Af Amer: >60 mL/min (ref >60)
GFR calc non Af Amer: >60 mL/min (ref >60)
Glucose, Bld: 106 mg/dL — ABNORMAL HIGH (ref 65–99)
POTASSIUM: 4.2 mmol/L (ref 3.5–5.1)
Sodium: 141 mmol/L (ref 135–145)
Total Bilirubin: 0.6 mg/dL (ref 0.3–1.2)
Total Protein: 6.7 g/dL (ref 6.5–8.1)

## 2014-11-20 LAB — I-STAT CG4 LACTIC ACID, ED
LACTIC ACID, VENOUS: 0.54 mmol/L (ref 0.5–2.0)
Lactic Acid, Venous: 0.97 mmol/L (ref 0.5–2.0)
Lactic Acid, Venous: 1.04 mmol/L (ref 0.5–2.0)

## 2014-11-20 LAB — TROPONIN I: Troponin I: <0.03 ng/mL (ref <0.031)

## 2014-11-20 LAB — BRAIN NATRIURETIC PEPTIDE: B Natriuretic Peptide: 115.2 pg/mL — ABNORMAL HIGH (ref 0.0–100.0)

## 2014-11-20 MED ORDER — ALBUTEROL SULFATE (2.5 MG/3ML) 0.083% IN NEBU
2.5000 mg | INHALATION_SOLUTION | Freq: Four times a day (QID) | RESPIRATORY_TRACT | Status: DC
  Administered 2014-11-20 – 2014-11-21 (×4): 2.5 mg via RESPIRATORY_TRACT
  Filled 2014-11-20 (×8): qty 3

## 2014-11-20 MED ORDER — ACETAMINOPHEN 650 MG RE SUPP
650.0000 mg | Freq: Four times a day (QID) | RECTAL | Status: DC | PRN
Start: 2014-11-20 — End: 2014-11-28

## 2014-11-20 MED ORDER — ONDANSETRON HCL 4 MG/2ML IJ SOLN
4.0000 mg | Freq: Four times a day (QID) | INTRAMUSCULAR | Status: DC | PRN
  Administered 2014-11-22: 4 mg via INTRAVENOUS
  Filled 2014-11-20: qty 2

## 2014-11-20 MED ORDER — ENOXAPARIN SODIUM 40 MG/0.4ML ~~LOC~~ SOLN
40.0000 mg | Freq: Every day | SUBCUTANEOUS | Status: DC
  Administered 2014-11-21 – 2014-11-27 (×7): 40 mg via SUBCUTANEOUS
  Filled 2014-11-20 (×14): qty 0.4

## 2014-11-20 MED ORDER — ONDANSETRON HCL 4 MG PO TABS
4.0000 mg | ORAL_TABLET | Freq: Four times a day (QID) | ORAL | Status: DC | PRN
  Administered 2014-11-21: 4 mg via ORAL
  Filled 2014-11-20: qty 1

## 2014-11-20 MED ORDER — SODIUM CHLORIDE 0.9 % IJ SOLN
3.0000 mL | Freq: Two times a day (BID) | INTRAMUSCULAR | Status: DC
  Administered 2014-11-21 – 2014-11-28 (×8): 3 mL via INTRAVENOUS

## 2014-11-20 MED ORDER — GUAIFENESIN-DM 100-10 MG/5ML PO SYRP
5.0000 mL | ORAL_SOLUTION | ORAL | Status: DC | PRN
  Administered 2014-11-20: 5 mL via ORAL
  Filled 2014-11-20: qty 5

## 2014-11-20 MED ORDER — SORBITOL 70 % SOLN
30.0000 mL | Freq: Every day | Status: DC | PRN
  Filled 2014-11-20: qty 30

## 2014-11-20 MED ORDER — KCL IN DEXTROSE-NACL 10-5-0.45 MEQ/L-%-% IV SOLN
INTRAVENOUS | Status: DC
  Administered 2014-11-20 – 2014-11-27 (×6): via INTRAVENOUS
  Filled 2014-11-20 (×15): qty 1000

## 2014-11-20 MED ORDER — HYDROCODONE-ACETAMINOPHEN 5-325 MG PO TABS
1.0000 | ORAL_TABLET | ORAL | Status: DC | PRN
  Administered 2014-11-20 – 2014-11-21 (×2): 1 via ORAL
  Administered 2014-11-21: 2 via ORAL
  Administered 2014-11-21 (×2): 1 via ORAL
  Administered 2014-11-22 (×2): 2 via ORAL
  Filled 2014-11-20: qty 2
  Filled 2014-11-20 (×3): qty 1
  Filled 2014-11-20: qty 2
  Filled 2014-11-20: qty 1
  Filled 2014-11-20: qty 2
  Filled 2014-11-20: qty 1

## 2014-11-20 MED ORDER — ALUM & MAG HYDROXIDE-SIMETH 200-200-20 MG/5ML PO SUSP: 30.0000 mL | Freq: Four times a day (QID) | ORAL | Status: DC | PRN

## 2014-11-20 MED ORDER — ACETAMINOPHEN 325 MG PO TABS
650.0000 mg | ORAL_TABLET | Freq: Four times a day (QID) | ORAL | Status: DC | PRN
  Administered 2014-11-21 – 2014-11-28 (×12): 650 mg via ORAL
  Filled 2014-11-20 (×12): qty 2

## 2014-11-20 MED ORDER — ASPIRIN EC 81 MG PO TBEC
81.0000 mg | DELAYED_RELEASE_TABLET | Freq: Every day | ORAL | Status: DC
  Administered 2014-11-21 – 2014-11-28 (×8): 81 mg via ORAL
  Filled 2014-11-20 (×9): qty 1

## 2014-11-20 NOTE — Patient Outreach (Signed)
[facility]) Care Management  11/20/2014  [name] [name] 09-Dec-1940 [id number]  Follow up call to [name] to ensure she received her albuterol nebulizer medication, she reported that it was delivered on yesterday and the Home health RN visited and changed her dressing.  [name] reports feeling better on today, denies increased shortness of breath, cough or sputum production.  [name]'s son Ardine Bjork called this am to follow up again regarding her appointment with Dr.Vantrigt on 9/14, he states that he is arranging [employer] transportation.  Plan: will continue to follow transition of care  Joylene Draft, RN, [place] Care Management [phone]

## 2014-11-20 NOTE — Progress Notes (Signed)
PCP is HAMRICK,MAURA L, MD Referring Provider is No ref. provider found  Chief Complaint  Patient presents with  . 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 The catheter was removed about 3 weeks ago and earlier today she had recurrent symptoms of shortness of breath with exertion. She is brought to the emergency department where chest x-ray showed a right basilar pneumothorax, similar to her previous spontaneous pneumothorax. She has had 2 previous tubes for this recurrent right basilar pneumothorax--first a conventional tube and then on the second hasn't patient a pigtail catheter. The patient has severe COPD and is inoperable for a surgical VATS to staple blebs or surgically treat the pneumothorax. She will need a long-term indwelling small pleural catheter to minimize her discomfort so we will admit the patient and ask interventional radiology to replace one in the morning under CT guidance.  Past Medical History  Diagnosis Date  . Hypertension   . Shortness of breath   . Asthma   . COPD (chronic obstructive pulmonary disease)     emphysema    . Pneumonia     hx pneumonia ,chronic bronchitis   . H/O blood clots   . Chronic kidney disease     current   UTI   being treated  . GERD (gastroesophageal reflux disease)   . Headache(784.0)     hx migraines  . Blood dyscrasia     hx blood clotts  . Arthritis   . Oxygen deficiency   . Hyperlipidemia   . Blood transfusion without reported diagnosis   . Anemia   . Pneumothorax on right 09/2014    Past Surgical History  Procedure Laterality Date  . Right leg    . Hip fracture surgery      RIGHT SIDE  .  Shoulder surgery      RIGHT  . Cholecystectomy    . Eye surgery      BIL CATARACT   . Orif wrist fracture  10/13/2011    Procedure: OPEN REDUCTION INTERNAL FIXATION (ORIF) WRIST FRACTURE;  Surgeon: [name], MD;  Location: [place];  Service: Orthopedics;  Laterality: Right;  Open Reduction Internal Fixation Right Distal Radius   . Abdominal hysterectomy    . Btl    . Hardware removal  01/29/2012    Procedure: HARDWARE REMOVAL;  Surgeon: [name], MD;  Location: [place];  Service: Orthopedics;  Laterality: Right;  . Hip arthroplasty  01/29/2012    Procedure: ARTHROPLASTY BIPOLAR HIP;  Surgeon: [name], MD;  Location: [place];  Service: Orthopedics;  Laterality: Right;  . Cardiac catheterization    . Transurethral resection of bladder tumor N/A 06/11/2014    Procedure: TRANSURETHRAL RESECTION OF BLADDER TUMOR (TURBT);  Surgeon: [name], MD;  Location: [facility];  Service: Urology;  Laterality: N/A;  . Cystoscopy w/ retrogrades Bilateral 06/11/2014    Procedure: CYSTOSCOPY WITH RETROGRADE PYELOGRAM;  Surgeon: [name], MD;  Location: [facility];  Service: Urology;  Laterality: Bilateral;    Family History  Problem Relation Age of Onset  . Cancer - Lung Mother   . Coronary artery disease Mother   . Coronary artery disease Sister   . Coronary artery disease Brother  Social History Social History  Substance Use Topics  . Smoking status: Former Smoker -- 1.00 packs/day for 50 years    Types: Cigarettes    Quit date: 10/27/2013  . Smokeless tobacco: Never Used  . Alcohol Use: No    Current Facility-Administered Medications  Medication Dose Route Frequency Provider Last Rate Last Dose  . acetaminophen (TYLENOL) tablet 650 mg  650 mg Oral Q6H PRN Ivin Poot, MD       Or  . acetaminophen (TYLENOL) suppository 650 mg  650 mg Rectal Q6H PRN Ivin Poot, MD      . albuterol (PROVENTIL) (5 MG/ML) 0.5% nebulizer solution 2.5 mg  2.5 mg Nebulization Q6H Ivin Poot, MD      . alum & mag hydroxide-simeth (MAALOX/MYLANTA) 200-200-20 MG/5ML suspension 30 mL  30 mL Oral Q6H PRN Ivin Poot, MD      . Derrill Memo ON 11/21/2014] aspirin EC tablet 81 mg  81 mg Oral Daily Ivin Poot, MD      . dextrose 5 % and 0.45 % NaCl with KCl 10 mEq/L infusion   Intravenous Continuous Ivin Poot, MD      . enoxaparin (LOVENOX) injection 30 mg  30 mg Subcutaneous Q24H Ivin Poot, MD      . guaiFENesin-dextromethorphan [facility] DM) 100-10 MG/5ML syrup 5 mL  5 mL Oral Q4H PRN Ivin Poot, MD      . HYDROcodone-acetaminophen (NORCO/VICODIN) 5-325 MG per tablet 1-2 tablet  1-2 tablet Oral Q4H PRN Ivin Poot, MD      . ondansetron [facility]) tablet 4 mg  4 mg Oral Q6H PRN Ivin Poot, MD       Or  . ondansetron [facility]) injection 4 mg  4 mg Intravenous Q6H PRN Ivin Poot, MD      . sodium chloride 0.9 % injection 3 mL  3 mL Intravenous Q12H Ivin Poot, MD      . sorbitol 70 % solution 30 mL  30 mL Oral Daily PRN Ivin Poot, MD       Current Outpatient Prescriptions  Medication Sig Dispense Refill  . albuterol (PROVENTIL) (2.5 MG/3ML) 0.083% nebulizer solution Take 2.5 mg by nebulization every 6 (six) hours as needed for wheezing or shortness of breath.     Marland Kitchen atorvastatin (LIPITOR) 40 MG tablet Take 40 mg by mouth every morning.     . budesonide-formoterol (SYMBICORT) 160-4.5 MCG/ACT inhaler Inhale 2 puffs into the lungs 2 (two) times daily. 1 Inhaler 12  . digoxin (LANOXIN) 0.25 MG tablet Take 0.125 mg by mouth every morning.     . diltiazem (CARDIZEM CD) 240 MG 24 hr capsule Take 240 mg by mouth every morning.   1  . docusate sodium (COLACE) 100 MG capsule Take 100 mg by mouth daily as needed (constipation).     . Ferrous Sulfate (IRON) 325 (65 FE) MG TABS Take 325 mg by mouth daily.    . fluticasone (FLONASE) 50 MCG/ACT nasal spray Place 1 spray into both nostrils daily as needed for allergies or rhinitis.    . furosemide (LASIX)  20 MG tablet Take 1 tablet (20 mg total) by mouth daily. 30 tablet 1  . metoprolol succinate (TOPROL-XL) 50 MG 24 hr tablet Take 12.5 mg by mouth daily. Take with or immediately following a meal.    . Omega-3 Fatty Acids (FISH OIL) 1000 MG CAPS Take 1,000 capsules by mouth daily.    Marland Kitchen oxybutynin (  DITROPAN-XL) 5 MG 24 hr tablet Take 10 mg by mouth at bedtime.    . pantoprazole (PROTONIX) 40 MG tablet Take 1 tablet (40 mg total) by mouth daily. 30 tablet 0  . rivaroxaban (XARELTO) 20 MG TABS tablet Take 20 mg by mouth daily with supper.    . tiotropium (SPIRIVA) 18 MCG inhalation capsule Place 1 capsule (18 mcg total) into inhaler and inhale daily. 30 capsule 12  . traMADol (ULTRAM) 50 MG tablet Take 1 tablet (50 mg total) by mouth every 6 (six) hours as needed for moderate pain (pain). (Patient taking differently: Take 50 mg by mouth at bedtime as needed for moderate pain (pain). ) 30 tablet 0  . VENTOLIN HFA 108 (90 BASE) MCG/ACT inhaler Inhale 2 puffs into the lungs 4 (four) times daily as needed for wheezing or shortness of breath.     . vitamin B-12 (CYANOCOBALAMIN) 500 MCG tablet Take 1,000 mcg by mouth daily.     . iron polysaccharides (NIFEREX) 150 MG capsule Take 1 capsule (150 mg total) by mouth daily. 60 capsule 1    No Known Allergies  Review of Systems  No fever or productive cough Patient is ambulating daily at home but remains on oxygen BP 155/52 mmHg  Pulse 74  Temp(Src) 98.4 F (36.9 C) (Oral)  Resp 22  SpO2 99% Physical Exam Distant breath sounds with scattered rhonchi bilaterally Heart rhythm regular No edema Neuro intact No evidence  Of airleak through tube when  patient coughs  Diagnostic Tests: Chest x-ray taken today personally reviewed-this shows the same right basilar pneumothorax she had difficulty with last month. I   Plan Patient will be admitted to a monitored bed and we will ask interventional radiology place a pigtail catheter for long-term drainage of  her pleural space . She is not an operable candidate for VATS. Len Childs, MD [facility] [phone] And and and and and and and and a

## 2014-11-20 NOTE — ED Notes (Signed)
Pt arrived by [facility] with co of respiratory distress. Was due to have in home physical therapy this afternoon. Therapist denied therapy and called EMS due to respiratory distress. Pt told EMS that she took 3 breathing treatments during the night. Initially stated it helped but during transport stated that it did not. Pt also stated to EMS that she can not normally lay flat but that during the night it was the most comfortable position for her to rest. Pt recently discharged for 2nd pneumothorax within 2 mos. Pt on continuous O2 at home at 2L but EMS bumped her up to 4 during transport.

## 2014-11-20 NOTE — ED Notes (Signed)
cardiothoracic surgeon at bedside- plan of care discussed with pt. Okay for pt to eat

## 2014-11-20 NOTE — ED Provider Notes (Signed)
CSN: [id number]     Arrival date & time 11/20/14  1716 History   First MD Initiated Contact with Patient 11/20/14 1718     Chief Complaint  Patient presents with  . Respiratory Distress   Patient is a 74 y.o. female presenting with general illness. The history is provided by the patient. No language interpreter was used.  Illness Location:  NA Quality:  SOB Severity:  Severe Onset quality:  Sudden Timing:  Constant Progression:  Unchanged Chronicity:  Recurrent Context:  PMHx HTN, HLD, COPD (2L [place] all times, albuterol QID baseline), CKD, and previous Hx of L PTX & recent R PTX presenting with SOB. Sudden onset yesterday evening and persistent until presentation. Associated with productive cough with clearish sputum tinged with blood 1 week. Denies fever, N/V/D, dysuria, or decreased appetite. Patient has been eating and drinking well. Patient use extra epidural treatments history evening with minimal improvement. Associated symptoms: congestion, cough, rhinorrhea and shortness of breath   Associated symptoms: no abdominal pain, no chest pain, no fever, no nausea and no vomiting     Past Medical History  Diagnosis Date  . Hypertension   . Shortness of breath   . Asthma   . COPD (chronic obstructive pulmonary disease)     emphysema    . GERD (gastroesophageal reflux disease)   . Blood dyscrasia     hx blood clotts  . Hyperlipidemia   . Anemia   . Pneumothorax on right 09/2014  . On home oxygen therapy     "3L; 24/7" (11/21/2014)  . Pneumonia     hx pneumonia   . Chronic bronchitis   . Pulmonary embolism   . History of blood transfusion X 2    "related to blood thinner I was taking"  . GURKYHCW(237.6)     "a few times/year" (11/21/2014)  . Migraine     "weekly probably" (11/21/2014)  . Arthritis     "back" (11/21/2014)  . Chronic lower back pain   . Chronic kidney disease   . Urinary incontinence    Past Surgical History  Procedure Laterality Date  . Hemiarthroplasty shoulder  fracture Right   . Cataract extraction w/ intraocular lens  implant, bilateral Bilateral   . Orif wrist fracture  10/13/2011    Procedure: OPEN REDUCTION INTERNAL FIXATION (ORIF) WRIST FRACTURE;  Surgeon: [name], MD;  Location: [place];  Service: Orthopedics;  Laterality: Right;  Open Reduction Internal Fixation Right Distal Radius   . Hardware removal  01/29/2012    Procedure: HARDWARE REMOVAL;  Surgeon: [name], MD;  Location: [place];  Service: Orthopedics;  Laterality: Right;  . Hip arthroplasty  01/29/2012    Procedure: ARTHROPLASTY BIPOLAR HIP;  Surgeon: [name], MD;  Location: [place];  Service: Orthopedics;  Laterality: Right;  . Transurethral resection of bladder tumor N/A 06/11/2014    Procedure: TRANSURETHRAL RESECTION OF BLADDER TUMOR (TURBT);  Surgeon: [name], MD;  Location: [facility];  Service: Urology;  Laterality: N/A;  . Cystoscopy w/ retrogrades Bilateral 06/11/2014    Procedure: CYSTOSCOPY WITH RETROGRADE PYELOGRAM;  Surgeon: [name], MD;  Location: [facility];  Service: Urology;  Laterality: Bilateral;  . Fracture surgery    . Laparoscopic cholecystectomy    . Tubal ligation    . Abdominal hysterectomy     Family History  Problem Relation Age of Onset  . Cancer - Lung Mother   . Coronary artery disease Mother   . Coronary artery disease Sister   .  Coronary artery disease Brother    Social History  Substance Use Topics  . Smoking status: Former Smoker -- 1.00 packs/day for 56 years    Types: Cigarettes    Quit date: 02/12/2014  . Smokeless tobacco: Never Used  . Alcohol Use: No   OB History    No data available      Review of Systems  Constitutional: Positive for chills. Negative for fever.  HENT: Positive for congestion and rhinorrhea.   Respiratory: Positive for cough and shortness of breath.   Cardiovascular: Negative for chest pain.  Gastrointestinal: Negative for nausea, vomiting and abdominal pain.  All other systems reviewed and  are negative.   Allergies  Review of patient's allergies indicates no known allergies.  Home Medications   Prior to Admission medications   Medication Sig Start Date End Date Taking? Authorizing Provider  albuterol (PROVENTIL) (2.5 MG/3ML) 0.083% nebulizer solution Take 2.5 mg by nebulization every 6 (six) hours as needed for wheezing or shortness of breath.    Yes Historical Provider, MD  atorvastatin (LIPITOR) 40 MG tablet Take 40 mg by mouth every morning.  07/02/13  Yes Historical Provider, MD  budesonide-formoterol (SYMBICORT) 160-4.5 MCG/ACT inhaler Inhale 2 puffs into the lungs 2 (two) times daily. 08/06/13  Yes Barton Dubois, MD  digoxin (LANOXIN) 0.25 MG tablet Take 0.125 mg by mouth every morning.    Yes Historical Provider, MD  diltiazem (CARDIZEM CD) 240 MG 24 hr capsule Take 240 mg by mouth every morning.  04/08/14  Yes Historical Provider, MD  docusate sodium (COLACE) 100 MG capsule Take 100 mg by mouth daily as needed (constipation).    Yes Historical Provider, MD  Ferrous Sulfate (IRON) 325 (65 FE) MG TABS Take 325 mg by mouth daily.   Yes Historical Provider, MD  fluticasone (FLONASE) 50 MCG/ACT nasal spray Place 1 spray into both nostrils daily as needed for allergies or rhinitis. 08/06/13  Yes Barton Dubois, MD  furosemide (LASIX) 20 MG tablet Take 1 tablet (20 mg total) by mouth daily. 06/13/14  Yes Theodis Blaze, MD  metoprolol succinate (TOPROL-XL) 50 MG 24 hr tablet Take 12.5 mg by mouth daily. Take with or immediately following a meal.   Yes Historical Provider, MD  Omega-3 Fatty Acids (FISH OIL) 1000 MG CAPS Take 1,000 capsules by mouth daily.   Yes Historical Provider, MD  oxybutynin (DITROPAN-XL) 5 MG 24 hr tablet Take 10 mg by mouth at bedtime.   Yes Historical Provider, MD  pantoprazole (PROTONIX) 40 MG tablet Take 1 tablet (40 mg total) by mouth daily. 04/20/83  Yes Delora Fuel, MD  rivaroxaban (XARELTO) 20 MG TABS tablet Take 20 mg by mouth daily with supper.   Yes  Historical Provider, MD  tiotropium (SPIRIVA) 18 MCG inhalation capsule Place 1 capsule (18 mcg total) into inhaler and inhale daily. 08/06/13  Yes Barton Dubois, MD  traMADol (ULTRAM) 50 MG tablet Take 1 tablet (50 mg total) by mouth every 6 (six) hours as needed for moderate pain (pain). Patient taking differently: Take 50 mg by mouth at bedtime as needed for moderate pain (pain).  09/19/14  Yes Donielle Liston Alba, PA-C  VENTOLIN HFA 108 (90 BASE) MCG/ACT inhaler Inhale 2 puffs into the lungs 4 (four) times daily as needed for wheezing or shortness of breath.  06/07/14  Yes Historical Provider, MD  vitamin B-12 (CYANOCOBALAMIN) 500 MCG tablet Take 1,000 mcg by mouth daily.    Yes Historical Provider, MD  iron polysaccharides (NIFEREX) 150 MG  capsule Take 1 capsule (150 mg total) by mouth daily. 04/29/14   Annita Brod, MD   BP 153/67 mmHg  Pulse 66  Temp(Src) 98 F (36.7 C) (Oral)  Resp 19  SpO2 99%   Physical Exam  Constitutional: She is oriented to person, place, and time. She appears well-developed and well-nourished. No distress.  HENT:  Head: Normocephalic and atraumatic.  Eyes: Conjunctivae are normal. Pupils are equal, round, and reactive to light.  Neck: Normal range of motion. Neck supple.  Cardiovascular:  Heart rate 70's. Hypertensive with SBP's and 150's.  Pulmonary/Chest:  Tachypneic to mid 20s. Oxygen saturation 97% on 3.5 L. Lungs without wheezing but decreased breath sounds on right.  Abdominal: Soft. Bowel sounds are normal. She exhibits no distension. There is no tenderness. There is no rebound.  Musculoskeletal: Normal range of motion.  Neurological: She is alert and oriented to person, place, and time.  Skin: Skin is warm and dry. She is not diaphoretic.    ED Course  Procedures   Labs Review Labs Reviewed  CBC WITH DIFFERENTIAL/PLATELET - Abnormal; Notable for the following:    RBC 3.80 (*)    Hemoglobin 10.0 (*)    HCT 33.6 (*)    MCHC 29.8 (*)     RDW 16.6 (*)    Platelets 76 (*)    Monocytes Relative 13 (*)    All other components within normal limits  COMPREHENSIVE METABOLIC PANEL - Abnormal; Notable for the following:    Chloride 98 (*)    CO2 36 (*)    Glucose, Bld 106 (*)    Albumin 3.2 (*)    ALT 11 (*)    All other components within normal limits  BRAIN NATRIURETIC PEPTIDE - Abnormal; Notable for the following:    B Natriuretic Peptide 115.2 (*)    All other components within normal limits  TROPONIN I  CBC WITH DIFFERENTIAL/PLATELET  PROTIME-INR  I-STAT CG4 LACTIC ACID, ED  I-STAT CG4 LACTIC ACID, ED  I-STAT CG4 LACTIC ACID, ED   Imaging Review Dg Chest Portable 1 View  11/20/2014   CLINICAL DATA:  Intermittent chest pain over the last week. Shortness of breath. Hypoxia.  EXAM: PORTABLE CHEST - 1 VIEW  COMPARISON:  Two-view chest x-ray 11/06/2014.  FINDINGS: The heart size is normal. Atherosclerotic calcifications are again noted at the aortic arch. A large right-sided pneumothorax is present with collapse of the right lower lobe. Ill-defined airspace opacities are present in the right upper lobe. The left lung is clear. Emphysematous changes are again seen.  Previously noted small bore chest tube on the right has been removed.  IMPRESSION: 1. Removal of small bore chest tube with recurrent large right pneumothorax. 2. Collapse of the right lower lobe. 3. Increased opacities in the right upper lobe may reflect atelectasis or less likely superimposed infection. These can again be evaluated after re-expansion of the lung. Critical Value/emergent results were called by telephone at the time of interpretation on 11/20/2014 at 6:08 pm to Dr. Mayer Camel , who verbally acknowledged these results.   Electronically Signed   By: San Morelle M.D.   On: 11/20/2014 18:08   I have personally reviewed and evaluated these images and lab results as part of my medical decision-making.   EKG Interpretation   Date/Time:  Wednesday  November 20 2014 17:17:49 EDT Ventricular Rate:  71 PR Interval:  150 QRS Duration: 83 QT Interval:  368 QTC Calculation: 400 R Axis:   82 Text Interpretation:  Sinus rhythm Right atrial enlargement Borderline  right axis deviation Probable LVH with secondary repol abnrm Confirmed by  Alvino Chapel  MD, Ovid Curd [phone]) on 11/20/2014 5:25:24 PM      MDM  Historical is a 74 yo female w/ PMHx HTN, HLD, COPD (2L [place] all times, albuterol QID baseline), CKD, and previous Hx of L PTX & recent R PTX presenting with SOB. Sudden onset yesterday evening and persistent until presentation. Associated with productive cough with clearish sputum tinged with blood 1 week. Denies fever, N/V/D, dysuria, or decreased appetite. Patient has been eating and drinking well. Patient use extra epidural treatments history evening with minimal improvement.  Exam above notable for elderly female lying in stretcher in no acute distress. Afebrile. Heart rate 70's. Tachypneic to mid 20s. Oxygen saturation 97% on 3.5 L. Hypertensive with SBP's and 150's. Lungs without wheezing but decreased breath sounds on right.  CBC and CMP relatively unremarkable. Lactic acid 0.97. Troponin 0.03. BNP 115. Portable chest x-ray showing collapsed RLL and increased opacities of RUL. Cardiothoracic surgery consulted and evaluated the patient in the emergency department with recommendations for admission and placement of right-sided pigtail under IR guidance. Patient understands and agrees with plan and has no further questions concerning this time.  Patient care discussed with followed by my attending, Dr. Davonna Belling  Clinical Impression: 1) Right sided Pneumothorax 2) Shortness of breath 3) Severe COPD  Mayer Camel, MD 11/21/14 [id number]  Davonna Belling, MD 11/21/14 [phone]

## 2014-11-21 ENCOUNTER — Encounter (HOSPITAL_COMMUNITY): Payer: Self-pay | Admitting: General Practice

## 2014-11-21 ENCOUNTER — Other Ambulatory Visit: Payer: Self-pay | Admitting: Licensed Clinical Social Worker

## 2014-11-21 ENCOUNTER — Inpatient Hospital Stay (HOSPITAL_COMMUNITY): Payer: Commercial Managed Care - HMO

## 2014-11-21 DIAGNOSIS — J9311 Primary spontaneous pneumothorax: Secondary | ICD-10-CM

## 2014-11-21 LAB — CBC WITH DIFFERENTIAL/PLATELET
BASOS PCT: 0 % (ref 0–1)
Basophils Absolute: 0 10*3/uL (ref 0.0–0.1)
EOS ABS: 0.2 10*3/uL (ref 0.0–0.7)
EOS PCT: 4 % (ref 0–5)
HCT: 43.1 % (ref 36.0–46.0)
HEMOGLOBIN: 13.4 g/dL (ref 12.0–15.0)
Lymphocytes Relative: 27 % (ref 12–46)
Lymphs Abs: 1.4 10*3/uL (ref 0.7–4.0)
MCH: 27.2 pg (ref 26.0–34.0)
MCHC: 31.1 g/dL (ref 30.0–36.0)
MCV: 87.4 fL (ref 78.0–100.0)
MONOS PCT: 15 % — AB (ref 3–12)
Monocytes Absolute: 0.8 10*3/uL (ref 0.1–1.0)
NEUTROS PCT: 54 % (ref 43–77)
Neutro Abs: 2.9 10*3/uL (ref 1.7–7.7)
PLATELETS: 99 10*3/uL — AB (ref 150–400)
RBC: 4.93 MIL/uL (ref 3.87–5.11)
RDW: 16.6 % — AB (ref 11.5–15.5)
WBC: 5.3 10*3/uL (ref 4.0–10.5)

## 2014-11-21 LAB — PROTIME-INR
INR: 1.05 (ref 0.00–1.49)
PROTHROMBIN TIME: 13.9 s (ref 11.6–15.2)

## 2014-11-21 MED ORDER — MIDAZOLAM HCL 2 MG/2ML IJ SOLN
INTRAMUSCULAR | Status: AC
  Filled 2014-11-21: qty 4

## 2014-11-21 MED ORDER — MIDAZOLAM HCL 2 MG/2ML IJ SOLN
INTRAMUSCULAR | Status: AC | PRN
  Administered 2014-11-21: 0.5 mg via INTRAVENOUS

## 2014-11-21 MED ORDER — FENTANYL CITRATE (PF) 100 MCG/2ML IJ SOLN: 12.5000 ug | Freq: Once | INTRAMUSCULAR | Status: DC

## 2014-11-21 MED ORDER — MIDAZOLAM HCL 2 MG/2ML IJ SOLN
INTRAMUSCULAR | Status: AC
  Filled 2014-11-21: qty 2

## 2014-11-21 MED ORDER — LIDOCAINE-EPINEPHRINE 1 %-1:100000 IJ SOLN
INTRAMUSCULAR | Status: AC
Start: 2014-11-21 — End: 2014-11-22
  Filled 2014-11-21: qty 1

## 2014-11-21 MED ORDER — FENTANYL CITRATE (PF) 100 MCG/2ML IJ SOLN
INTRAMUSCULAR | Status: DC
Start: 2014-11-21 — End: 2014-11-21
  Filled 2014-11-21: qty 2

## 2014-11-21 MED ORDER — FENTANYL CITRATE (PF) 100 MCG/2ML IJ SOLN
INTRAMUSCULAR | Status: AC
  Filled 2014-11-21: qty 2

## 2014-11-21 MED ORDER — FENTANYL CITRATE (PF) 100 MCG/2ML IJ SOLN
INTRAMUSCULAR | Status: AC | PRN
  Administered 2014-11-21: 25 ug via INTRAVENOUS

## 2014-11-21 MED ORDER — DIGOXIN 250 MCG PO TABS
0.2500 mg | ORAL_TABLET | Freq: Every day | ORAL | Status: DC
  Filled 2014-11-21: qty 1
  Filled 2014-11-21: qty 2

## 2014-11-21 MED ORDER — CETYLPYRIDINIUM CHLORIDE 0.05 % MT LIQD
7.0000 mL | Freq: Two times a day (BID) | OROMUCOSAL | Status: DC
  Administered 2014-11-21 – 2014-11-28 (×16): 7 mL via OROMUCOSAL

## 2014-11-21 MED ORDER — INFLUENZA VAC SPLIT QUAD 0.5 ML IM SUSY
0.5000 mL | PREFILLED_SYRINGE | INTRAMUSCULAR | Status: DC
  Filled 2014-11-21: qty 0.5

## 2014-11-21 MED ORDER — METOPROLOL SUCCINATE 12.5 MG HALF TABLET
12.5000 mg | ORAL_TABLET | Freq: Every day | ORAL | Status: DC
  Administered 2014-11-22 – 2014-11-28 (×7): 12.5 mg via ORAL
  Filled 2014-11-21 (×8): qty 1

## 2014-11-21 MED ORDER — DILTIAZEM HCL ER COATED BEADS 240 MG PO CP24
240.0000 mg | ORAL_CAPSULE | Freq: Every day | ORAL | Status: DC
  Administered 2014-11-22 – 2014-11-28 (×7): 240 mg via ORAL
  Filled 2014-11-21 (×8): qty 1

## 2014-11-21 NOTE — Sedation Documentation (Signed)
Chest tube set to low wall continuous suction per MD

## 2014-11-21 NOTE — Consult Note (Signed)
Chief Complaint: Patient was seen in consultation today for recurrent R pneumothorax Chief Complaint  Patient presents with  . Respiratory Distress   at the request of Dr Prescott Gum  Referring Physician(s): Dr Prescott Gum  History of Present Illness: [name] [name] is a 74 y.o. 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 Removal of small bore chest tube with recurrent large right pneumothorax. 2. Collapse of the right lower lobe. 3. Increased opacities in the right upper lobe may reflect atelectasis or less likely superimposed infection. These can again be evaluated after re-expansion of the lung.  Request now for replacement of Rt chest tube catheter placement for PTX Dr Laurence Ferrari has reviewed imaging and approves procedure I have seen and examined pt  Past Medical History  Diagnosis Date  . Hypertension   . Shortness of breath   . Asthma   . COPD (chronic obstructive pulmonary disease)     emphysema    . GERD (gastroesophageal reflux disease)   . Blood dyscrasia     hx blood clotts  . Hyperlipidemia   . Anemia   . Pneumothorax on right 09/2014  . On home oxygen therapy     "3L; 24/7" (11/21/2014)  . Pneumonia     hx pneumonia   . Chronic bronchitis   . Pulmonary embolism   . History of blood transfusion X 2    "related to blood thinner I was taking"  . XKPVVZSM(270.7)     "a few times/year" (11/21/2014)  . Migraine     "weekly probably" (11/21/2014)  . Arthritis     "back" (11/21/2014)  . Chronic lower back pain   . Chronic kidney disease   . Urinary incontinence     Past Surgical History  Procedure Laterality Date  . Hemiarthroplasty shoulder fracture Right   . Cataract extraction w/ intraocular lens  implant, bilateral Bilateral   . Orif wrist fracture  10/13/2011     Procedure: OPEN REDUCTION INTERNAL FIXATION (ORIF) WRIST FRACTURE;  Surgeon: [name], MD;  Location: [place];  Service: Orthopedics;  Laterality: Right;  Open Reduction Internal Fixation Right Distal Radius   . Hardware removal  01/29/2012    Procedure: HARDWARE REMOVAL;  Surgeon: [name], MD;  Location: [place];  Service: Orthopedics;  Laterality: Right;  . Hip arthroplasty  01/29/2012    Procedure: ARTHROPLASTY BIPOLAR HIP;  Surgeon: [name], MD;  Location: [facility];  Service: Orthopedics;  Laterality: Right;  . Transurethral resection of bladder tumor N/A 06/11/2014    Procedure: TRANSURETHRAL RESECTION OF BLADDER TUMOR (TURBT);  Surgeon: [name], MD;  [facility];  Service: Urology;  Laterality: N/A;  . Cystoscopy w/ retrogrades Bilateral 06/11/2014    Procedure: CYSTOSCOPY WITH RETROGRADE PYELOGRAM;  Surgeon: [name], MD;  Location: [facility];  Service: Urology;  Laterality: Bilateral;  . Fracture surgery    . Laparoscopic cholecystectomy    . Tubal ligation    . Abdominal hysterectomy      Allergies: Review of patient's allergies indicates no known allergies.  Medications: Prior to Admission medications   Medication Sig Start Date End Date Taking? Authorizing Provider  albuterol (PROVENTIL) (2.5 MG/3ML) 0.083% nebulizer solution Take 2.5 mg by nebulization every 6 (six) hours as needed for wheezing or shortness of breath.  Yes Historical Provider, MD  atorvastatin (LIPITOR) 40 MG tablet Take 40 mg by mouth every morning.  07/02/13  Yes Historical Provider, MD  budesonide-formoterol (SYMBICORT) 160-4.5 MCG/ACT inhaler Inhale 2 puffs into the lungs 2 (two) times daily. 08/06/13  Yes Barton Dubois, MD  digoxin (LANOXIN) 0.25 MG tablet Take 0.125 mg by mouth every morning.    Yes Historical Provider, MD  diltiazem (CARDIZEM CD) 240 MG 24 hr capsule Take 240 mg by mouth every morning.  04/08/14  Yes Historical Provider, MD  docusate sodium (COLACE) 100 MG capsule  Take 100 mg by mouth daily as needed (constipation).    Yes Historical Provider, MD  Ferrous Sulfate (IRON) 325 (65 FE) MG TABS Take 325 mg by mouth daily.   Yes Historical Provider, MD  fluticasone (FLONASE) 50 MCG/ACT nasal spray Place 1 spray into both nostrils daily as needed for allergies or rhinitis. 08/06/13  Yes Barton Dubois, MD  furosemide (LASIX) 20 MG tablet Take 1 tablet (20 mg total) by mouth daily. 06/13/14  Yes Theodis Blaze, MD  metoprolol succinate (TOPROL-XL) 50 MG 24 hr tablet Take 12.5 mg by mouth daily. Take with or immediately following a meal.   Yes Historical Provider, MD  Omega-3 Fatty Acids (FISH OIL) 1000 MG CAPS Take 1,000 capsules by mouth daily.   Yes Historical Provider, MD  oxybutynin (DITROPAN-XL) 5 MG 24 hr tablet Take 10 mg by mouth at bedtime.   Yes Historical Provider, MD  pantoprazole (PROTONIX) 40 MG tablet Take 1 tablet (40 mg total) by mouth daily. 05/15/33  Yes Delora Fuel, MD  rivaroxaban (XARELTO) 20 MG TABS tablet Take 20 mg by mouth daily with supper.   Yes Historical Provider, MD  tiotropium (SPIRIVA) 18 MCG inhalation capsule Place 1 capsule (18 mcg total) into inhaler and inhale daily. 08/06/13  Yes Barton Dubois, MD  traMADol (ULTRAM) 50 MG tablet Take 1 tablet (50 mg total) by mouth every 6 (six) hours as needed for moderate pain (pain). Patient taking differently: Take 50 mg by mouth at bedtime as needed for moderate pain (pain).  09/19/14  Yes Donielle Liston Alba, PA-C  VENTOLIN HFA 108 (90 BASE) MCG/ACT inhaler Inhale 2 puffs into the lungs 4 (four) times daily as needed for wheezing or shortness of breath.  06/07/14  Yes Historical Provider, MD  vitamin B-12 (CYANOCOBALAMIN) 500 MCG tablet Take 1,000 mcg by mouth daily.    Yes Historical Provider, MD  iron polysaccharides (NIFEREX) 150 MG capsule Take 1 capsule (150 mg total) by mouth daily. 04/29/14   Annita Brod, MD     Family History  Problem Relation Age of Onset  . Cancer - Lung Mother     . Coronary artery disease Mother   . Coronary artery disease Sister   . Coronary artery disease Brother     Social History   Social History  . Marital Status: Widowed    Spouse Name: N/A  . Number of Children: N/A  . Years of Education: N/A   Social History Main Topics  . Smoking status: Former Smoker -- 1.00 packs/day for 56 years    Types: Cigarettes    Quit date: 02/12/2014  . Smokeless tobacco: Never Used  . Alcohol Use: No  . Drug Use: No  . Sexual Activity: Not Asked   Other Topics Concern  . None   Social History Narrative     Review of Systems: A 12 point ROS discussed and pertinent positives are indicated in the HPI  above.  All other systems are negative.  Review of Systems  Constitutional: Positive for activity change and fatigue.  Respiratory: Positive for shortness of breath. Negative for wheezing.   Neurological: Positive for weakness.  Psychiatric/Behavioral: Negative for behavioral problems and confusion.    Vital Signs: BP 154/69 mmHg  Pulse 66  Temp(Src) 98.2 F (36.8 C) (Oral)  Resp 18  Ht 5\' 5"  (1.651 m)  Wt 112 lb 9.6 oz (51.075 kg)  BMI 18.74 kg/m2  SpO2 96%  Physical Exam  Constitutional: She is oriented to person, place, and time.  Cardiovascular: Normal rate and regular rhythm.   Pulmonary/Chest: Effort normal and breath sounds normal. No respiratory distress.  Abdominal: Soft. Bowel sounds are normal.  Musculoskeletal: Normal range of motion.  Neurological: She is alert and oriented to person, place, and time.  Skin: Skin is warm and dry.  Psychiatric: She has a normal mood and affect. Her behavior is normal. Judgment and thought content normal.  Nursing note and vitals reviewed.   Mallampati Score:  MD Evaluation Airway: WNL Heart: WNL Abdomen: WNL Chest/ Lungs: WNL ASA  Classification: 3  Imaging: Dg Chest 2 View  11/06/2014   CLINICAL DATA:  Followup right-sided pneumothorax.  EXAM: CHEST  2 VIEW  COMPARISON:   Multiple prior chest x-rays. The most recent is 10/19/2014  FINDINGS: The right basilar pleural drainage catheter is stable. There is a small right basilar hydropneumothorax.  The cardiac silhouette, mediastinal and hilar contours are stable. There is tortuosity and calcification of the thoracic aorta. Stable underlying chronic lung disease with emphysema and scarring. Stable biapical pleural and parenchymal scarring with remote surgical changes at the right lung apex.  IMPRESSION: Right basilar pleural drainage catheter is stable. There is a small right basilar hydro pneumothorax.  Chronic lung changes.   Electronically Signed   By: Marijo Sanes M.D.   On: 11/06/2014 12:41   Dg Chest Portable 1 View  11/20/2014   CLINICAL DATA:  Intermittent chest pain over the last week. Shortness of breath. Hypoxia.  EXAM: PORTABLE CHEST - 1 VIEW  COMPARISON:  Two-view chest x-ray 11/06/2014.  FINDINGS: The heart size is normal. Atherosclerotic calcifications are again noted at the aortic arch. A large right-sided pneumothorax is present with collapse of the right lower lobe. Ill-defined airspace opacities are present in the right upper lobe. The left lung is clear. Emphysematous changes are again seen.  Previously noted small bore chest tube on the right has been removed.  IMPRESSION: 1. Removal of small bore chest tube with recurrent large right pneumothorax. 2. Collapse of the right lower lobe. 3. Increased opacities in the right upper lobe may reflect atelectasis or less likely superimposed infection. These can again be evaluated after re-expansion of the lung. Critical Value/emergent results were called by telephone at the time of interpretation on 11/20/2014 at 6:08 pm to Dr. Mayer Camel , who verbally acknowledged these results.   Electronically Signed   By: San Morelle M.D.   On: 11/20/2014 18:08    Labs:  CBC:  Recent Labs  09/17/14 0255 10/09/14 0827 11/20/14 1833 11/21/14 0542  WBC 10.4 5.1  4.6 5.3  HGB 11.6* 13.1 10.0* 13.4  HCT 36.9 43.7 33.6* 43.1  PLT 104* 98* 76* 99*    COAGS:  Recent Labs  04/28/14 1355 04/29/14 0004 04/29/14 0620 04/30/14 0600 06/08/14 1201 06/16/14 1557 11/21/14 0542  INR  --   --   --   --  1.32 1.03 1.05  APTT  50* 86* 61* 50*  --   --   --     BMP:  Recent Labs  09/15/14 0614 09/17/14 0255 10/09/14 0827 11/20/14 1833  NA 137 137 138 141  K 3.6 4.7 4.0 4.2  CL 95* 97* 99* 98*  CO2 34* 32 31 36*  GLUCOSE 134* 115* 112* 106*  BUN 14 17 16 8   CALCIUM 8.8* 8.4* 8.6* 9.4  CREATININE 0.63 0.54 0.83 0.61  GFRNONAA >60 >60 >60 >60  GFRAA >60 >60 >60 >60    LIVER FUNCTION TESTS:  Recent Labs  06/08/14 1319 09/15/14 0614 10/09/14 0827 11/20/14 1833  BILITOT 0.6 0.6 0.4 0.6  AST 21 16 16 15   ALT 13 12* 12* 11*  ALKPHOS 72 62 56 61  PROT 8.0 6.3* 6.6 6.7  ALBUMIN 3.7 2.9* 3.3* 3.2*    TUMOR MARKERS: No results for input(s): AFPTM, CEA, CA199, CHROMGRNA in the last 8760 hours.  Assessment and Plan:  COPD Bullous emphysema Recurrent Rt PTX Now for Rt chest tube catheter placement Pt aware of procedure benefits and risks including but not limited to Infection; bleeding; damage to surrounding structures Agreeable to  Proceed Consent signed andin chart  Thank you for this interesting consult.  I greatly enjoyed meeting YURY SCHAUS and look forward to participating in their care.  A copy of this report was sent to the requesting provider on this date.  Signed: Glenis Musolf A 11/21/2014, 10:01 AM   I spent a total of 40 Minutes    in face to face in clinical consultation, greater than 50% of which was counseling/coordinating care for Rt chest tube placement

## 2014-11-21 NOTE — Patient Outreach (Signed)
[name] [name] [facility]) Care Management  11/21/2014  [name] [name] 05-20-1940 [id number]  Assessment-[name] communicated with patient's RNCM [name] [name] on patient's status as she had completed a recent home visit. [name] informed RNCM that social worker in SNF had confirmed to [facility] that a master's level social worker would be coming to patient's home to complete personal care service application. RNCM did not feel that personal care services referral had taken place as patient did not report a Education officer, museum coming to residence but only home health. RNCM reported that patient is communicating wishes to relocate to [place], [place]. Patient previously reported that she was receiving Section 8 but [employer] confirmed that she has not been receiving Section 8 in several years and therefore would not be able to assist with Section 8 relocation. [name] is still researching other possible housing options besides [employer]. [name] contacted patient's PCP office on 11/21/14 to request personal care service application completion. Staff member at [facility] stated that she would need to make an appointment for this. [name] saw that she had an upcoming appointment with Dr.Vantrigt and contacted office to see if personal care service application could be completed but was unsuccessful in reaching anyone. [name] left a HIPPA compliant voice message for nurse, encouraging call back.  Plan-[name] will await call back from Dr. Thayer Ohm office and remain available to patient for all further needs.  [name] [name], [name] [name], [name] [name], [email or web address] Phone: [phone] Fax: [phone]

## 2014-11-21 NOTE — Procedures (Signed)
Technically successful CT guided placed of a 12 Fr drainage catheter placement into left pleural space. The CT was connected to a pleur-vac device. No immediate post procedural complications.   Ronny Bacon, MD Pager #: [phone]

## 2014-11-21 NOTE — Sedation Documentation (Signed)
Patient is resting comfortably. 

## 2014-11-21 NOTE — Progress Notes (Signed)
Patient admitted from CT /Right Chest Tube /to low wall suction as ordered/ small leak noted/dsg applied to area Charge Nurse Katharine Look aware .Both IV sites were clotted of so IV team was notified to restart IV  Vital signs stable oriented to room and call bell within reach ,bed in low position,02 at 2lnc

## 2014-11-21 NOTE — H&P (Signed)
Subjective:   [name] [name] is a 74 yo white female well known to [facility].  female who is well known to [facility]. She has a history of Atrial Fibrillation for which she takes Xarelto, HTN, GERD, and anemia. She has known history of advanced COPD on home oxygen. She underwent a right VATS procedure 10 years ago. That procedure consisted of bled resection and pleurodesis of the upper space. The patient has done well since that time. However, on 09/14/2014 she presented to the [facility] ED with complaints of upper respiratory infection symptoms, severe cough, and right sided chest pain with worsening shortness of breath. CXR at that time showed a 30% right sided pneumothorax. She was evaluated by [name] at that time who recommended placement of a right sided chest tube. This was performed with resolution of her pneumothorax. Her hospital course was uncomplicated and she was later discharged home on 09/17/2014 at which time her CXR was free from pneumothorax, but did have some evidence of subcutaneous emphysema present. The patient did well after discharge and was seen in follow up at [facility] on 09/30/2014. At that visit the patient had no complaints. Repeat CXR was obtained and was again free from pneumothorax. However, the patient presents to [facility] ED on 10/09/2014.  She was again found to have a pneumothorax and underwent IR placement of a pigtail catheter.  She was later discharged to SNF with a pigtail catheter in place.  She was routinely followed by [name] and her pneumothorax had resolvedand her chest tube was removed on 11/06/2014.  The patient again presented to the ED on 9//09/2014 with complaints of sudden onset shortness of breath.  CXR obtained again shows a basilar pneumothorax.  She was admitted for further care.  Currently patient has intermittent shortness of breath.   Patient Active Problem List   Diagnosis Date Noted  . Pneumothorax, right 11/20/2014  . Pneumothorax   . Recurrent  spontaneous pneumothorax   . Chest tube in place   . Pneumothorax on right 10/09/2014  . Pneumothorax on left 09/14/2014  . Gross hematuria   . Hematuria 06/10/2014  . Cystitis, acute hemorrhagic 06/08/2014  . Hematochezia 06/08/2014  . Acute pyelonephritis 04/28/2014  . COLD (chronic obstructive lung disease)   . Essential hypertension   . CAP (community acquired pneumonia) 04/27/2014  . SOB (shortness of breath) 04/27/2014  . Microcytic anemia 04/27/2014  . Hemorrhagic cystitis 08/03/2013  . Anemia due to acute blood loss 08/03/2013  . COPD exacerbation 07/08/2013  . COPD with acute exacerbation 07/08/2013  . Paroxysmal atrial fibrillation 02/03/2012  . UTI (lower urinary tract infection) 02/02/2012  . Thrombocytopenia 02/02/2012  . Fracture of right hip 01/29/2012  . Multiple rib fractures 01/29/2012  . Fall due to stumbling 01/29/2012  . CAD (coronary artery disease) 01/29/2012  . Hypertension 01/29/2012  . COPD (chronic obstructive pulmonary disease) 01/29/2012  . CKD (chronic kidney disease) 01/29/2012  . Osteoarthritis 01/29/2012  . Closed fracture of right distal radius 10/13/2011    Class: Acute   Past Medical History  Diagnosis Date  . Hypertension   . Shortness of breath   . Asthma   . COPD (chronic obstructive pulmonary disease)     emphysema    . GERD (gastroesophageal reflux disease)   . Blood dyscrasia     hx blood clotts  . Hyperlipidemia   . Anemia   . Pneumothorax on right 09/2014  . On home oxygen therapy     "3L;  24/7" (11/21/2014)  . Pneumonia     hx pneumonia   . Chronic bronchitis   . Pulmonary embolism   . History of blood transfusion X 2    "related to blood thinner I was taking"  . XTKWIOXB(353.2)     "a few times/year" (11/21/2014)  . Migraine     "weekly probably" (11/21/2014)  . Arthritis     "back" (11/21/2014)  . Chronic lower back pain   . Chronic kidney disease   . Urinary incontinence     Past Surgical History  Procedure  Laterality Date  . Hemiarthroplasty shoulder fracture Right   . Cataract extraction w/ intraocular lens  implant, bilateral Bilateral   . Orif wrist fracture  10/13/2011    Procedure: OPEN REDUCTION INTERNAL FIXATION (ORIF) WRIST FRACTURE;  Surgeon: [name], MD;  Location: [place];  Service: Orthopedics;  Laterality: Right;  Open Reduction Internal Fixation Right Distal Radius   . Hardware removal  01/29/2012    Procedure: HARDWARE REMOVAL;  Surgeon: [name], MD;  Location: [place];  Service: Orthopedics;  Laterality: Right;  . Hip arthroplasty  01/29/2012    Procedure: ARTHROPLASTY BIPOLAR HIP;  Surgeon: [name], MD;  Location: [place];  Service: Orthopedics;  Laterality: Right;  . Transurethral resection of bladder tumor N/A 06/11/2014    Procedure: TRANSURETHRAL RESECTION OF BLADDER TUMOR (TURBT);  Surgeon: [name], MD;  [facility];  Service: Urology;  Laterality: N/A;  . Cystoscopy w/ retrogrades Bilateral 06/11/2014    Procedure: CYSTOSCOPY WITH RETROGRADE PYELOGRAM;  Surgeon: [name], MD;  [facility];  Service: Urology;  Laterality: Bilateral;  . Fracture surgery    . Laparoscopic cholecystectomy    . Tubal ligation    . Abdominal hysterectomy      Prescriptions prior to admission  Medication Sig Dispense Refill Last Dose  . albuterol (PROVENTIL) (2.5 MG/3ML) 0.083% nebulizer solution Take 2.5 mg by nebulization every 6 (six) hours as needed for wheezing or shortness of breath.    PRN  . atorvastatin (LIPITOR) 40 MG tablet Take 40 mg by mouth every morning.    11/20/2014  . budesonide-formoterol (SYMBICORT) 160-4.5 MCG/ACT inhaler Inhale 2 puffs into the lungs 2 (two) times daily. 1 Inhaler 12 11/19/2014 at Unknown time  . digoxin (LANOXIN) 0.25 MG tablet Take 0.125 mg by mouth every morning.    11/20/2014 at Unknown time  . diltiazem (CARDIZEM CD) 240 MG 24 hr capsule Take 240 mg by mouth every morning.   1 11/20/2014  . docusate sodium (COLACE) 100 MG  capsule Take 100 mg by mouth daily as needed (constipation).    PRN  . Ferrous Sulfate (IRON) 325 (65 FE) MG TABS Take 325 mg by mouth daily.   11/20/2014  . fluticasone (FLONASE) 50 MCG/ACT nasal spray Place 1 spray into both nostrils daily as needed for allergies or rhinitis.   PRN  . furosemide (LASIX) 20 MG tablet Take 1 tablet (20 mg total) by mouth daily. 30 tablet 1 11/20/2014  . metoprolol succinate (TOPROL-XL) 50 MG 24 hr tablet Take 12.5 mg by mouth daily. Take with or immediately following a meal.   11/20/2014 at 0900  . Omega-3 Fatty Acids (FISH OIL) 1000 MG CAPS Take 1,000 capsules by mouth daily.   11/20/2014  . oxybutynin (DITROPAN-XL) 5 MG 24 hr tablet Take 10 mg by mouth at bedtime.   11/19/2014 at Unknown time  . pantoprazole (PROTONIX) 40 MG tablet Take 1 tablet (40 mg  total) by mouth daily. 30 tablet 0 11/20/2014  . rivaroxaban (XARELTO) 20 MG TABS tablet Take 20 mg by mouth daily with supper.   11/19/2014 at Unknown time  . tiotropium (SPIRIVA) 18 MCG inhalation capsule Place 1 capsule (18 mcg total) into inhaler and inhale daily. 30 capsule 12 11/20/2014  . traMADol (ULTRAM) 50 MG tablet Take 1 tablet (50 mg total) by mouth every 6 (six) hours as needed for moderate pain (pain). (Patient taking differently: Take 50 mg by mouth at bedtime as needed for moderate pain (pain). ) 30 tablet 0 PRN at Unknown time  . VENTOLIN HFA 108 (90 BASE) MCG/ACT inhaler Inhale 2 puffs into the lungs 4 (four) times daily as needed for wheezing or shortness of breath.    PRN  . vitamin B-12 (CYANOCOBALAMIN) 500 MCG tablet Take 1,000 mcg by mouth daily.    11/20/2014  . iron polysaccharides (NIFEREX) 150 MG capsule Take 1 capsule (150 mg total) by mouth daily. 60 capsule 1 Taking   No Known Allergies  Social History  Substance Use Topics  . Smoking status: Former Smoker -- 1.00 packs/day for 56 years    Types: Cigarettes    Quit date: 02/12/2014  . Smokeless tobacco: Never Used  . Alcohol Use: No    Family  History  Problem Relation Age of Onset  . Cancer - Lung Mother   . Coronary artery disease Mother   . Coronary artery disease Sister   . Coronary artery disease Brother     Review of Systems Pertinent items are noted in HPI.  Objective:   Patient Vitals for the past 8 hrs:  BP Temp Temp src Pulse Resp SpO2 Weight  11/21/14 0846 - - - - - 96 % -  11/21/14 0531 (!) 154/69 mmHg 98.2 F (36.8 C) Oral 66 18 98 % 112 lb 9.6 oz (51.075 kg)   I/O last 3 completed shifts: In: -  Out: 500 [Urine:500]   BP 154/69 mmHg  Pulse 66  Temp(Src) 98.2 F (36.8 C) (Oral)  Resp 18  Ht 5\' 5"  (1.651 m)  Wt 112 lb 9.6 oz (51.075 kg)  BMI 18.74 kg/m2  SpO2 96% General appearance: alert, cooperative and no distress Head: Normocephalic, without obvious abnormality, atraumatic Lungs: wheezes bilaterally Heart: irregular Abdomen: soft, non-tender; bowel sounds normal; no masses,  no organomegaly Neurologic: Grossly normal  A/P:  1. 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 CV- H/O A. Fib continue dig, cardizem, toprol- will hold Xarelto until chest tube in place 3. Pulm- severe COPD, on oxygen at home 4. Dispo- admit for chest tube placement, continue current care  Patient examined and chest x-ray reviewed. Unfortunate but very nice elderly woman with severe end-stage COPD and recurrent spontaneous right pneumothorax. This will be her third chest tube in the past 6 months. We'll plan long-term drainage of right pleural space with small interventional radiology pigtail catheter placed under CT guidance because of the loculated location at the base.  patient examined and medical record reviewed,agree with above note. [name] 11/21/2014

## 2014-11-21 NOTE — Patient Outreach (Signed)
[place] [facility]) Care Management  11/21/2014  [name] [name] [birth year] [id number]   Assessment-[name] received call back from Dr. Thayer Ohm nurse, HIPPA information received. Nurse stated that patient's PCP should complete form and that he would not be able to complete personal care services application form at next scheduled appointment. [name] then contacted PCP office to inform Dr. Onnie Boer nurse that [name] will be faxing over personal care services form tomorrow on 11/22/14 to be completed by Dr. Lisbeth Ply. Nurse informed [name] t hat they will contact [name] after receiving faxed document to see if any further information or assistance will be needed for completion. [name] received a voice message back from Dr. Thayer Ohm nurse to inform [name] that patient is currently in the hospital at [facility] due to respiratory distress.   Plan-[name] will inform RNCM [name] [name] of hospital admission. [name] will fax personal care service form to patient's PCP tomorrow on 11/22/14 and remain available to patient for any further needs.  [name], BSW, MSW, [email or web address] Phone: [phone] Fax: [phone]

## 2014-11-21 NOTE — Care Management Note (Addendum)
Case Management Note  Patient Details  Name: [name] [name] MRN: [id number] Date of Birth: May 06, 1940  Subjective/Objective: Pt admitted for Respiratory Distress. Pt is from home alone. Has support of two sons.                     Action/Plan: CM did speak with [name] with permission of pt. Per son pt has [facility] and is active with [facility] for RN, PT services. CM did call Liaison with [place] to verify information. Liaison to call this CM back. CM did ask for PT/ OT evaluation- pt may benefit from higher level of care. Will continue to monitor.   Expected Discharge Date:                  Expected Discharge Plan:  [place]  In-House Referral:     Discharge planning Services  CM Consult  Post Acute Care Choice:    Choice offered to:     DME Arranged:    DME Agency:     HH Arranged:    [place] Agency:     Status of Service:  In process, will continue to follow  Medicare Important Message Given:    Date Medicare IM Given:    Medicare IM give by:    Date Additional Medicare IM Given:    Additional Medicare Important Message give by:     If discussed at [place] of Stay Meetings, dates discussed:    Additional Comments: 1214 11-21-14 Jacqlyn Krauss, RN,BSN [phone] CM did speak to [facility] and they can take pt as long she will not have a drain. CM will continue to monitor.   Bethena Roys, RN 11/21/2014, 11:36 AM

## 2014-11-21 NOTE — Evaluation (Signed)
Physical Therapy Evaluation Patient Details Name: [name] [name] MRN: [id number] DOB: [birth year] Today's Date: 11/21/2014   History of Present Illness  74 y.o. female admitted to [facility] on 11/20/14 for SOB.  Pt found to havea basilar pneumothorax.  Plan for chest tube placement on 11/21/14.  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  Clinical Impression  Limited evaluation completed as pt became nauseated and vomited when she sat up EOB.  Pt would benefit from further assessment before PT gives formal recommendations.  We plan to see her tomorrow.   PT to follow acutely for deficits listed below.       Follow Up Recommendations Other (comment) (limited assessment, will further assess tomorrow)    Equipment Recommendations  None recommended by PT    Recommendations for Other Services   NA    Precautions / Restrictions Precautions Precautions: Other (comment) Precaution Comments: monitor O2 sats      Mobility  Bed Mobility Overal bed mobility: Modified Independent             General bed mobility comments: Pt with HOB maximally elevated and using railing for leverage  Transfers                 General transfer comment: did not attempt as pt became nauseated on EOB and started throwing up.  RN aware  Ambulation/Gait             General Gait Details: did not attempt as pt became nauseated on EOB         Balance Overall balance assessment: Needs assistance Sitting-balance support: Feet supported;No upper extremity supported Sitting balance-Leahy Scale: Good                                       Pertinent Vitals/Pain Pain Assessment: No/denies pain    Home Living Family/patient expects to be discharged to:: Private residence Living Arrangements:  Alone Available Help at Discharge: Family;Available PRN/intermittently (son and his wife check on her 2 times per week- stop by) Type of Home: House Home Access: Level entry     Home Layout: One level Home Equipment: Walker - 2 wheels;Bedside commode;Shower seat;Adaptive equipment Additional Comments: wears home O2 4 L /min    Prior Function Level of Independence: Independent with assistive device(s) (walks with RW)         Comments: Pt does not drive; family assists with grocery shopping     Hand Dominance   Dominant Hand: Left    Extremity/Trunk Assessment   Upper Extremity Assessment: Generalized weakness           Lower Extremity Assessment: Generalized weakness      Cervical / Trunk Assessment: Kyphotic  Communication   Communication: HOH (poor dentation- no teeth)  Cognition Arousal/Alertness: Awake/alert Behavior During Therapy: WFL for tasks assessed/performed Overall Cognitive Status: Within Functional Limits for tasks assessed                               Assessment/Plan    PT Assessment Patient needs continued PT services  PT Diagnosis Difficulty walking;Abnormality of gait;Generalized weakness   PT Problem List Decreased strength;Decreased activity tolerance;Decreased balance;Decreased mobility;Cardiopulmonary status  limiting activity  PT Treatment Interventions DME instruction;Gait training;Functional mobility training;Therapeutic activities;Balance training;Therapeutic exercise;Neuromuscular re-education;Patient/family education   PT Goals (Current goals can be found in the Care Plan section) Acute Rehab PT Goals Patient Stated Goal: to feel better and stop coming into the hospital all the time.  PT Goal Formulation: With patient Time For Goal Achievement: 12/05/14 Potential to Achieve Goals: Good    Frequency Min 3X/week   Barriers to discharge Decreased caregiver support pt lives alone       End of Session Equipment  Utilized During Treatment: Oxygen Activity Tolerance: Treatment limited secondary to medical complications (Comment) (limited by N/V) Patient left: in bed;with call bell/phone within reach;with bed alarm set Nurse Communication: Mobility status         Time: 9355-2174 PT Time Calculation (min) (ACUTE ONLY): 21 min   Charges:   PT Evaluation $Initial PT Evaluation Tier I: 1 Procedure          Evangaline Jou B. [place], [place], DPT [phone]   11/21/2014, 2:53 PM

## 2014-11-21 NOTE — Progress Notes (Signed)
Pt a/o, c/o HA PRN Tylenol given at 2018, pt c/o pain PRN Vicodin given at 2217, VSS, pt stable

## 2014-11-21 NOTE — Progress Notes (Signed)
Utilization review completed. Exilda Wilhite, RN, BSN. 

## 2014-11-21 NOTE — Consult Note (Signed)
   [facility] CM Inpatient Consult   11/21/2014  [name] [name] 10-04-1940 [id number] Patient is currently active [up to [place]  with [facility] for chronic disease management services.  Patient has been engaged by a [employer], Software engineer,  and [name].  Our community based plan of care has focused on disease management and community resource support. Met with the patient at the bedside and she endorses ongoing care management needs with [facility] services.  Patient is admitted with shortness of breath with HX of COPD.   Patient will receive a post discharge transition of care call and will be evaluated for monthly home visits for assessments and disease process education.  Made Inpatient Case Manager aware that [facility] following. Of note, [facility] Care Management services does not replace or interfere with any services that are arranged by inpatient case management or social work.  For additional questions or referrals please contact: Natividad Brood, RN BSN [facility] Liaison  [phone] business mobile phone

## 2014-11-22 ENCOUNTER — Inpatient Hospital Stay (HOSPITAL_COMMUNITY): Payer: Commercial Managed Care - HMO

## 2014-11-22 MED ORDER — MAGNESIUM HYDROXIDE 400 MG/5ML PO SUSP: 15.0000 mL | Freq: Every day | ORAL | Status: DC | PRN

## 2014-11-22 MED ORDER — DOCUSATE SODIUM 100 MG PO CAPS: 100.0000 mg | ORAL_CAPSULE | Freq: Two times a day (BID) | ORAL | Status: DC | PRN

## 2014-11-22 MED ORDER — ALBUTEROL SULFATE (2.5 MG/3ML) 0.083% IN NEBU
2.5000 mg | INHALATION_SOLUTION | Freq: Four times a day (QID) | RESPIRATORY_TRACT | Status: DC | PRN
  Administered 2014-11-22 – 2014-11-28 (×11): 2.5 mg via RESPIRATORY_TRACT
  Filled 2014-11-22 (×11): qty 3

## 2014-11-22 MED ORDER — TRAMADOL HCL 50 MG PO TABS
50.0000 mg | ORAL_TABLET | Freq: Four times a day (QID) | ORAL | Status: DC | PRN
  Administered 2014-11-22 – 2014-11-27 (×14): 50 mg via ORAL
  Filled 2014-11-22 (×14): qty 1

## 2014-11-22 MED ORDER — DIGOXIN 125 MCG PO TABS
0.1250 mg | ORAL_TABLET | Freq: Every day | ORAL | Status: DC
  Administered 2014-11-24 – 2014-11-28 (×5): 0.125 mg via ORAL
  Filled 2014-11-22 (×6): qty 1

## 2014-11-22 NOTE — Care Management Important Message (Signed)
Important Message  Patient Details  Name: CHRISTE TELLEZ MRN: [id number] Date of Birth: 11-16-1940   Medicare Important Message Given:  Yes-second notification given    Nathen May 11/22/2014, 11:40 AMImportant Message  Patient Details  Name: TEALE GOODGAME MRN: [id number] Date of Birth: 02-19-1941   Medicare Important Message Given:  Yes-second notification given    Nathen May 11/22/2014, 11:40 AM

## 2014-11-22 NOTE — Progress Notes (Signed)
When asking questions to prepare to give flu shot patient stated that she is allergic to eggs and they cause itching. Validated with pharmacy that patient should not receive if she is allergic to eggs.

## 2014-11-22 NOTE — Progress Notes (Signed)
   11/22/14 1400  Clinical Encounter Type  Visited With Patient  Visit Type Initial;Spiritual support  Referral From Nurse  Spiritual Encounters  Spiritual Needs Prayer;Emotional  Stress Factors  Patient Stress Factors Other (Comment) (Pt was in pain )  Chaplain visted with Pt; Pt was in pain and concerning were about being taken to therapy when she was feeling ill; Chaplain prayed for patient; Pt expressed her faith in 69

## 2014-11-22 NOTE — Progress Notes (Addendum)
Patient ordered 0.25 digoxin on MAR which here is 2 tablets. Patient stated she only takes half a tablet at home. Per home list patient takes 0.125. [name] [name] PA notified and she will check. Hold medication for now per PA.

## 2014-11-22 NOTE — Progress Notes (Signed)
PT Cancellation Note  Patient Details Name: [name] MRN: [id number] DOB: 1941/02/11   Cancelled Treatment:    Reason Eval/Treat Not Completed: Other (comment); patient c/o continued nausea and even after checking with her twice today she continued to refuse and stated she could refuse since it was her right!  Will attempt again another day.   Deb Loudin,CYNDI 11/22/2014, 2:19 PM  Magda Kiel, [place] 11/22/2014

## 2014-11-22 NOTE — Discharge Summary (Signed)
[address] SpringsSuite [address]       [place] [address]             [phone]              Discharge Summary  Name: [name] [name] DOB: [birth year] 74 y.o. MRN: [id number]   Admission Date: 11/20/2014 Discharge Date: 11/28/2014  Admitting Diagnosis: Active Problems:   Pneumothorax, right  Discharge Diagnosis:  Active Problems:   Pneumothorax, right  Procedures: Pigtail Catheter placement   HPI:  The patient is a 74 y.o. female who is well known to [facility]. She has a history of Atrial Fibrillation for which she takes Xarelto, HTN, GERD, and anemia. She has known history of advanced COPD on home oxygen. She underwent a right VATS procedure 10 years ago. That procedure consisted of bleb resection and pleurodesis of the upper space. The patient has done well since that time. However, on 09/14/2014 she presented to the [facility] ED with complaints of upper respiratory infection symptoms, severe cough, and right sided chest pain with worsening shortness of breath. CXR at that time showed a 30% right sided pneumothorax. She was evaluated by [name] at that time who recommended placement of a right sided chest tube. This was performed with resolution of her pneumothorax. Her hospital course was uncomplicated and she was later discharged home on 09/17/2014 at which time her CXR was free from pneumothorax, but did have some evidence of subcutaneous emphysema present. The patient did well after discharge and was seen in follow up at [place] on 09/30/2014. At that visit the patient had no complaints. Repeat CXR was obtained and was again free from pneumothorax. However, the patient presents to [facility] ED on 10/09/2014. She was again found to have a pneumothorax and underwent IR placement of a pigtail catheter. She was later discharged to SNF with a pigtail catheter in place. She was routinely followed by [name] and her pneumothorax had resolvedand her chest tube was  removed on 11/06/2014. The patient again presented to the ED on 9//09/2014 with complaints of sudden onset shortness of breath. CXR showed a basilar pneumothorax. She was admitted for further care.      Hospital Course:  The patient was admitted to [facility] on 11/20/2014. A CT guided chest tube was placed by Interventional Radiology was 11/21/2014.  Chest tube was placed to suction with near complete resolution of her pneumothorax. She had a small air leak no exam. The chest tube was managed conservatively and slowly weaned to water seal.  Her CXR has been free from pneumothorax.  She has been transitioned to a [facility] and remains stable.  She developed UTI during hospitalization, was placed on Cipro and will complete a 10 day course.  Urine culture has been negative.  She is felt medically stable for discharge today.  Recent vital signs:  Filed Vitals:   11/28/14 1054  BP: 140/60  Pulse:   Temp:   Resp:     Recent laboratory studies:  CBC:No results for input(s): WBC, HGB, HCT, PLT in the last 72 hours. BMET:   Recent Labs  11/27/14 0500  CREATININE 0.52    PT/INR: No results for input(s): LABPROT, INR in the last 72 hours.   Discharge Medications:     Medication List    TAKE these medications        VENTOLIN HFA 108 (90 BASE) MCG/ACT inhaler  Generic drug:  albuterol  Inhale 2  puffs into the lungs 4 (four) times daily as needed for wheezing or shortness of breath.     albuterol (2.5 MG/3ML) 0.083% nebulizer solution  Commonly known as:  PROVENTIL  Take 2.5 mg by nebulization every 6 (six) hours as needed for wheezing or shortness of breath.     atorvastatin 40 MG tablet  Commonly known as:  LIPITOR  Take 40 mg by mouth every morning.     budesonide-formoterol 160-4.5 MCG/ACT inhaler  Commonly known as:  SYMBICORT  Inhale 2 puffs into the lungs 2 (two) times daily.     ciprofloxacin 500 MG tablet  Commonly known as:  CIPRO  Take 1 tablet (500 mg total) by  mouth 2 (two) times daily. For 7 days     digoxin 0.25 MG tablet  Commonly known as:  LANOXIN  Take 0.125 mg by mouth every morning.     diltiazem 240 MG 24 hr capsule  Commonly known as:  CARDIZEM CD  Take 240 mg by mouth every morning.     docusate sodium 100 MG capsule  Commonly known as:  COLACE  Take 100 mg by mouth daily as needed (constipation).     Fish Oil 1000 MG Caps  Take 1,000 capsules by mouth daily.     fluticasone 50 MCG/ACT nasal spray  Commonly known as:  FLONASE  Place 1 spray into both nostrils daily as needed for allergies or rhinitis.     furosemide 20 MG tablet  Commonly known as:  LASIX  Take 1 tablet (20 mg total) by mouth daily.     Iron 325 (65 FE) MG Tabs  Take 325 mg by mouth daily.     iron polysaccharides 150 MG capsule  Commonly known as:  NIFEREX  Take 1 capsule (150 mg total) by mouth daily.     metoprolol succinate 50 MG 24 hr tablet  Commonly known as:  TOPROL-XL  Take 12.5 mg by mouth daily. Take with or immediately following a meal.     oxybutynin 5 MG 24 hr tablet  Commonly known as:  DITROPAN-XL  Take 10 mg by mouth at bedtime.     pantoprazole 40 MG tablet  Commonly known as:  PROTONIX  Take 1 tablet (40 mg total) by mouth daily.     rivaroxaban 20 MG Tabs tablet  Commonly known as:  XARELTO  Take 20 mg by mouth daily with supper.     tiotropium 18 MCG inhalation capsule  Commonly known as:  SPIRIVA  Place 1 capsule (18 mcg total) into inhaler and inhale daily.     traMADol 50 MG tablet  Commonly known as:  ULTRAM  Take 1 tablet (50 mg total) by mouth every 6 (six) hours as needed for moderate pain (pain).     vitamin B-12 500 MCG tablet  Commonly known as:  CYANOCOBALAMIN  Take 1,000 mcg by mouth daily.         Discharge Instructions:  The patient is to refrain from driving, heavy lifting or strenuous activity.  May shower daily and clean incisions with soap and water.  May resume regular diet.   Follow  Up:    Follow-up Information    Follow up with Ivin Poot III, MD On 12/04/2014.   Specialty:  Cardiothoracic Surgery   Why:  Appointment is at 1:30   Contact information:   [place] [employer] [place] [address] [phone]       Follow up with [facility] On  12/04/2014.   Why:  please get CXR at 1:00 located on first floor of [place] information:   [facility]       Follow up with Leonides Sake, MD.   Specialty:  Family Medicine   Why:  Please set up appointment for a week after completion of Cipro for repeat UA   Contact information:   Dr. Daiva Eves [place] [address] [place], [place] 11/28/2014, 11:17 AM

## 2014-11-22 NOTE — Evaluation (Signed)
Occupational Therapy Evaluation Patient Details Name: [name] [name] MRN: [id number] DOB: 1941-02-24 Today's Date: 11/22/2014    History of Present Illness 74 y.o. female admitted to [facility] on 11/20/14 for SOB.  Pt found to havea basilar pneumothorax.  Plan for chest tube placement on 11/21/14.  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   Clinical Impression   Pt was ambulating with a RW and assisted with IADL.  She was able to perform self care and simple meal prep at a modified independent level.  Pt limited by pain at chest tube site and fear she would vomit, but overall performed at a supervision level due to multiple lines and decreased safety awareness.  Will follow acutely.    Follow Up Recommendations  Home health OT    Equipment Recommendations  None recommended by OT    Recommendations for Other Services       Precautions / Restrictions Precautions Precaution Comments: monitor O2 sats, chest tube Restrictions Weight Bearing Restrictions: No      Mobility Bed Mobility Overal bed mobility: Modified Independent             General bed mobility comments: pt able to achieve long sitting and then transition to EOB   Transfers Overall transfer level: Needs assistance   Transfers: Sit to/from Stand;Stand Pivot Transfers Sit to Stand: Supervision Stand pivot transfers: Supervision       General transfer comment: bed to 3 in 1    Balance     Sitting balance-Leahy Scale: Good       Standing balance-Leahy Scale: Fair                              ADL Overall ADL's : Needs assistance/impaired Eating/Feeding: Independent;Sitting   Grooming: Wash/dry hands;Brushing hair;Sitting;Set up   Upper Body Bathing: Set up;Sitting   Lower Body Bathing: Sit to/from  stand;Supervison/ safety   Upper Body Dressing : Set up;Sitting   Lower Body Dressing: Supervision/safety;Sit to/from stand   Toilet Transfer: Supervision/safety;Stand-pivot;BSC   Toileting- Water quality scientist and Hygiene: Supervision/safety;Sit to/from stand         General ADL Comments: Pt concerned she was going to vomit. Chose to use BSC instead of walking to bathroom.     Vision     Perception     Praxis      Pertinent Vitals/Pain Pain Assessment: 0-10 Pain Score: 4  Pain Location: chest tube site Pain Intervention(s): Premedicated before session;Monitored during session;Limited activity within patient's tolerance     Hand Dominance Left   Extremity/Trunk Assessment Upper Extremity Assessment Upper Extremity Assessment: Generalized weakness   Lower Extremity Assessment Lower Extremity Assessment: Defer to PT evaluation   Cervical / Trunk Assessment Cervical / Trunk Assessment: Kyphotic   Communication Communication Communication: HOH   Cognition Arousal/Alertness: Awake/alert Behavior During Therapy: WFL for tasks assessed/performed Overall Cognitive Status: No family/caregiver present to determine baseline cognitive functioning Area of Impairment: Safety/judgement         Safety/Judgement: Decreased awareness of safety;Decreased awareness of deficits     General Comments: pt with poor regard for multiple lines   General Comments       Exercises       Shoulder Instructions      Home Living Family/patient expects to  be discharged to:: Private residence Living Arrangements: Alone Available Help at Discharge: Family;Available PRN/intermittently Type of Home: House Home Access: Level entry     Home Layout: One level     Bathroom Shower/Tub: Teacher, early years/pre: Standard     Home Equipment: Environmental consultant - 2 wheels;Bedside commode;Shower seat;Adaptive equipment Adaptive Equipment: Reacher;Sock aid Additional Comments: wears  home O2 4 L /min      Prior Functioning/Environment Level of Independence: Independent with assistive device(s) (uses RW)        Comments: Pt does not drive; family assists with grocery shopping    OT Diagnosis: Generalized weakness;Cognitive deficits;Acute pain   OT Problem List: Decreased strength;Decreased activity tolerance;Impaired balance (sitting and/or standing);Pain   OT Treatment/Interventions: Self-care/ADL training;Patient/family education;Balance training    OT Goals(Current goals can be found in the care plan section) Acute Rehab OT Goals Patient Stated Goal: to feel better and stop coming into the hospital all the time.  OT Goal Formulation: With patient Time For Goal Achievement: 12/06/14 Potential to Achieve Goals: Good  OT Frequency: Min 2X/week   Barriers to D/C:            Co-evaluation              End of Session Equipment Utilized During Treatment: Oxygen  Activity Tolerance: Patient tolerated treatment well Patient left:  (on BSC, NT aware)   Time: 1430-1445 OT Time Calculation (min): 15 min Charges:  OT General Charges $OT Visit: 1 Procedure OT Evaluation $Initial OT Evaluation Tier I: 1 Procedure G-Codes:    Malka So 11/22/2014, 3:07 PM  [phone]

## 2014-11-22 NOTE — Progress Notes (Addendum)
      [address]       [place] [address]             [phone]      Subjective:  [name] [name] is agitated this morning.  She states she doesn't feel well.  She states she is jerking around.  She is nauseated and weak and cant eat.  She is mad she has been unable to eat.  She had similar nausea complaints during last admission.  Objective: Vital signs in last 24 hours: Temp:  [97.6 F (36.4 C)-98.1 F (36.7 C)] 97.9 F (36.6 C) (09/09 0406) Pulse Rate:  [52-79] 79 (09/09 0406) Cardiac Rhythm:  [-] Normal sinus rhythm (09/08 2049) Resp:  [9-18] 15 (09/09 0406) BP: (136-172)/(46-58) 154/52 mmHg (09/09 0406) SpO2:  [94 %-99 %] 95 % (09/09 0406)  Intake/Output from previous day: 09/08 0701 - 09/09 0700 In: 0  Out: 1240 [Urine:1220; Chest Tube:20]  General appearance: alert and agitated Heart: irregularly irregular rhythm Lungs: diminished breath sounds on the right Abdomen: soft, non-tender; bowel sounds normal; no masses,  no organomegaly Wound: clean and dry  Lab Results:  Recent Labs  11/20/14 1833 11/21/14 0542  WBC 4.6 5.3  HGB 10.0* 13.4  HCT 33.6* 43.1  PLT 76* 99*   BMET:  Recent Labs  11/20/14 1833  NA 141  K 4.2  CL 98*  CO2 36*  GLUCOSE 106*  BUN 8  CREATININE 0.61  CALCIUM 9.4    PT/INR:  Recent Labs  11/21/14 0542  LABPROT 13.9  INR 1.05   ABG    Component Value Date/Time   TCO2 39 12/28/2013 1041   CBG (last 3)  No results for input(s): GLUCAP in the last 72 hours.  Assessment/Plan:  1. Recurrent Basilar Pneumothorax- CXR was ordered for 6AM, has not yet been completed- CT currently on suction with intermittent air leak with cough 2. Pulm- severe COPD, on oxygen at home 3. GI- + nausea/vomiting unable to eat- this occurred after chest tube placement last hospitalization, continue Zofran will add Reglan  4. Neuro- patient states she is "jerking" none of this activity observed, she is neurologically intact 5.  Deconditioning- patient weak, H/H discontinued prior to hospitalization due to patient being unsafe at home, they felt she needed SNF... PT/OT ordered 6. Dispo- awaiting CXR, further recs to follow   LOS: 2 days    [name] [name] 11/22/2014  Leave CT to suctuion 48 hrs Reduce narcotics which give her nausea and headaches CXR shows re- expansion of RLL patient examined and medical record reviewed,agree with above note. [name] 11/22/2014

## 2014-11-23 ENCOUNTER — Inpatient Hospital Stay (HOSPITAL_COMMUNITY): Payer: Commercial Managed Care - HMO

## 2014-11-23 LAB — URINALYSIS, ROUTINE W REFLEX MICROSCOPIC
Bilirubin Urine: NEGATIVE
Glucose, UA: NEGATIVE mg/dL
Ketones, ur: NEGATIVE mg/dL
Nitrite: POSITIVE — AB
Protein, ur: NEGATIVE mg/dL
Specific Gravity, Urine: 1.007 (ref 1.005–1.030)
Urobilinogen, UA: 1 mg/dL (ref 0.0–1.0)
pH: 6.5 (ref 5.0–8.0)

## 2014-11-23 LAB — URINE MICROSCOPIC-ADD ON

## 2014-11-23 MED ORDER — CIPROFLOXACIN IN D5W 200 MG/100ML IV SOLN
200.0000 mg | Freq: Two times a day (BID) | INTRAVENOUS | Status: DC
  Administered 2014-11-23 – 2014-11-25 (×5): 200 mg via INTRAVENOUS
  Filled 2014-11-23 (×7): qty 100

## 2014-11-23 NOTE — Progress Notes (Signed)
Cm visited bedside to speak with pt about d/c planning but pt is resting with eyes closed. CM will speak to pt at another time.

## 2014-11-23 NOTE — Progress Notes (Signed)
Referring Physician(s): [name]  Chief Complaint:  F/U for pigtail chest tube placement on 11/21/14 by Dr. Pascal Lux for recurrent right pneumothorax  Subjective:  [name] [name] is doing ok. She has no complaints today   Allergies: Eggs or egg-derived products  Medications: Prior to Admission medications   Medication Sig Start Date End Date Taking? Authorizing Provider  albuterol (PROVENTIL) (2.5 MG/3ML) 0.083% nebulizer solution Take 2.5 mg by nebulization every 6 (six) hours as needed for wheezing or shortness of breath.    Yes Historical Provider, MD  atorvastatin (LIPITOR) 40 MG tablet Take 40 mg by mouth every morning.  07/02/13  Yes Historical Provider, MD  budesonide-formoterol (SYMBICORT) 160-4.5 MCG/ACT inhaler Inhale 2 puffs into the lungs 2 (two) times daily. 08/06/13  Yes Barton Dubois, MD  digoxin (LANOXIN) 0.25 MG tablet Take 0.125 mg by mouth every morning.    Yes Historical Provider, MD  diltiazem (CARDIZEM CD) 240 MG 24 hr capsule Take 240 mg by mouth every morning.  04/08/14  Yes Historical Provider, MD  docusate sodium (COLACE) 100 MG capsule Take 100 mg by mouth daily as needed (constipation).    Yes Historical Provider, MD  Ferrous Sulfate (IRON) 325 (65 FE) MG TABS Take 325 mg by mouth daily.   Yes Historical Provider, MD  fluticasone (FLONASE) 50 MCG/ACT nasal spray Place 1 spray into both nostrils daily as needed for allergies or rhinitis. 08/06/13  Yes Barton Dubois, MD  furosemide (LASIX) 20 MG tablet Take 1 tablet (20 mg total) by mouth daily. 06/13/14  Yes Theodis Blaze, MD  metoprolol succinate (TOPROL-XL) 50 MG 24 hr tablet Take 12.5 mg by mouth daily. Take with or immediately following a meal.   Yes Historical Provider, MD  Omega-3 Fatty Acids (FISH OIL) 1000 MG CAPS Take 1,000 capsules by mouth daily.   Yes Historical Provider, MD  oxybutynin (DITROPAN-XL) 5 MG 24 hr tablet Take 10 mg by mouth at bedtime.   Yes Historical Provider, MD  pantoprazole  (PROTONIX) 40 MG tablet Take 1 tablet (40 mg total) by mouth daily. 10/19/74  Yes Delora Fuel, MD  rivaroxaban (XARELTO) 20 MG TABS tablet Take 20 mg by mouth daily with supper.   Yes Historical Provider, MD  tiotropium (SPIRIVA) 18 MCG inhalation capsule Place 1 capsule (18 mcg total) into inhaler and inhale daily. 08/06/13  Yes Barton Dubois, MD  traMADol (ULTRAM) 50 MG tablet Take 1 tablet (50 mg total) by mouth every 6 (six) hours as needed for moderate pain (pain). Patient taking differently: Take 50 mg by mouth at bedtime as needed for moderate pain (pain).  09/19/14  Yes Donielle Liston Alba, PA-C  VENTOLIN HFA 108 (90 BASE) MCG/ACT inhaler Inhale 2 puffs into the lungs 4 (four) times daily as needed for wheezing or shortness of breath.  06/07/14  Yes Historical Provider, MD  vitamin B-12 (CYANOCOBALAMIN) 500 MCG tablet Take 1,000 mcg by mouth daily.    Yes Historical Provider, MD  iron polysaccharides (NIFEREX) 150 MG capsule Take 1 capsule (150 mg total) by mouth daily. 04/29/14   Annita Brod, MD     Vital Signs: BP 138/42 mmHg  Pulse 65  Temp(Src) 98.2 F (36.8 C) (Oral)  Resp 18  Ht 5\' 5"  (1.651 m)  Wt 112 lb 9.6 oz (51.075 kg)  BMI 18.74 kg/m2  SpO2 97%  Physical Exam   Awake and Alert Pigtail Chest tube in place, remains on suction = No air leak seen today Lungs with  wheezes bilaterally   Imaging: Dg Chest Port 1 View  11/22/2014   CLINICAL DATA:  Chest tube.  Pneumothorax.  EXAM: PORTABLE CHEST - 1 VIEW  COMPARISON:  11/20/2014.  FINDINGS: Interim placement of right chest tube. Interim near complete resolution of right pneumothorax with mild residual right base pneumothorax. Postsurgical changes right upper lung. Heart size normal. Mild subcutaneous emphysema right chest wall.  IMPRESSION: 1. Interim placement of right chest tube. Near complete resolution of right pneumothorax with mild residual basilar pneumothorax. 2. Postsurgical changes right upper lung.   Electronically  Signed   By: Marcello Moores  Register   On: 11/22/2014 08:07   Dg Chest Portable 1 View  11/20/2014   CLINICAL DATA:  Intermittent chest pain over the last week. Shortness of breath. Hypoxia.  EXAM: PORTABLE CHEST - 1 VIEW  COMPARISON:  Two-view chest x-ray 11/06/2014.  FINDINGS: The heart size is normal. Atherosclerotic calcifications are again noted at the aortic arch. A large right-sided pneumothorax is present with collapse of the right lower lobe. Ill-defined airspace opacities are present in the right upper lobe. The left lung is clear. Emphysematous changes are again seen.  Previously noted small bore chest tube on the right has been removed.  IMPRESSION: 1. Removal of small bore chest tube with recurrent large right pneumothorax. 2. Collapse of the right lower lobe. 3. Increased opacities in the right upper lobe may reflect atelectasis or less likely superimposed infection. These can again be evaluated after re-expansion of the lung. Critical Value/emergent results were called by telephone at the time of interpretation on 11/20/2014 at 6:08 pm to Dr. Mayer Camel , who verbally acknowledged these results.   Electronically Signed   By: San Morelle M.D.   On: 11/20/2014 18:08   Ct Image Guided Drainage By Percutaneous Catheter  11/21/2014   INDICATION: Recurrent right-sided pneumothorax. Note, patient with history of fluoroscopic guided right-sided chest tube placement on 10/09/2014. The percutaneous drainage catheter was removed on 11/06/2014, however patient returns with symptomatic right-sided pneumothorax. Request made for placement of a new new CT-guided chest tube.  EXAM: CT IMAGE GUIDED DRAINAGE BY PERCUTANEOUS CATHETER  COMPARISON:  Fluoroscopic guided right-sided chest tube placement - 10/09/2014  MEDICATIONS: The patient is currently admitted to the hospital and receiving intravenous antibiotics. The antibiotics were administered within an appropriate time frame prior to the initiation of the  procedure.  ANESTHESIA/SEDATION: Fentanyl 50 mcg IV; Versed 0.5 mg IV  Total Moderate Sedation time  12 minutes  CONTRAST:  None  COMPLICATIONS: None immediate  PROCEDURE: Informed written consent was obtained from the patient after a discussion of the risks, benefits and alternatives to treatment. The patient was placed supine on the CT gantry and a pre procedural CT was performed re-demonstrating the known large right basilar pneumothorax. The procedure was planned. A timeout was performed prior to the initiation of the procedure.  The inferior aspect the right lateral chest was prepped and draped in the usual sterile fashion. The overlying soft tissues were anesthetized with 1% lidocaine with epinephrine. Appropriate trajectory was planned with the use of a 22 gauge spinal needle. An 18 gauge trocar needle was advanced into the right pleural space and a short Amplatz super stiff wire was coiled within the collection. Appropriate positioning was confirmed with a limited CT scan. The tract was serially dilated allowing placement of a 12 [place] all-purpose drainage catheter. Appropriate positioning was confirmed with a limited postprocedural CT scan.  The tube was connected to a Pleur-Evac and sutured  in place. A dressing was placed. The patient tolerated the procedure well without immediate post procedural complication.  IMPRESSION: Successful CT guided placement of a 83 French all purpose drain catheter into the right pleural space and connected to a pleural vac. Samples were sent to the laboratory as requested by the ordering clinical team.   Electronically Signed   By: Sandi Mariscal M.D.   On: 11/21/2014 18:31    Labs:  CBC:  Recent Labs  09/17/14 0255 10/09/14 0827 11/20/14 1833 11/21/14 0542  WBC 10.4 5.1 4.6 5.3  HGB 11.6* 13.1 10.0* 13.4  HCT 36.9 43.7 33.6* 43.1  PLT 104* 98* 76* 99*    COAGS:  Recent Labs  04/28/14 1355 04/29/14 0004 04/29/14 0620 04/30/14 0600 06/08/14 1201  06/16/14 1557 11/21/14 0542  INR  --   --   --   --  1.32 1.03 1.05  APTT 50* 86* 61* 50*  --   --   --     BMP:  Recent Labs  09/15/14 0614 09/17/14 0255 10/09/14 0827 11/20/14 1833  NA 137 137 138 141  K 3.6 4.7 4.0 4.2  CL 95* 97* 99* 98*  CO2 34* 32 31 36*  GLUCOSE 134* 115* 112* 106*  BUN 14 17 16 8   CALCIUM 8.8* 8.4* 8.6* 9.4  CREATININE 0.63 0.54 0.83 0.61  GFRNONAA >60 >60 >60 >60  GFRAA >60 >60 >60 >60    LIVER FUNCTION TESTS:  Recent Labs  06/08/14 1319 09/15/14 0614 10/09/14 0827 11/20/14 1833  BILITOT 0.6 0.6 0.4 0.6  AST 21 16 16 15   ALT 13 12* 12* 11*  ALKPHOS 72 62 56 61  PROT 8.0 6.3* 6.6 6.7  ALBUMIN 3.7 2.9* 3.3* 3.2*    Assessment and Plan:  S/P pigtail chest tube placement by Dr. Pascal Lux 11/21/14 for recurrent right PTX PTX resolved on CXR No air leak CT note seen = continue suction today and possible transition to water seal tomorrow.  Signed: Murrell Redden PA-C 11/23/2014, 10:23 AM   I spent a total of 15 Minutes at the the patient's bedside AND on the patient's hospital floor or unit, greater than 50% of which was counseling/coordinating care for f/u pigtail chest tube placement

## 2014-11-23 NOTE — Progress Notes (Addendum)
      [address]       [place] [address]             [phone]      Subjective:  [name] [name] states she is feeling a little better this morning.  She states she was able to eat some breakfast.  Nursing sent a UA on the patient last night due to dark urine that was malodorous.  Patient states she has had symptoms for a while and had a UTI last time she was here.  She also has some low back pain.  Objective: Vital signs in last 24 hours: Temp:  [98.2 F (36.8 C)-98.7 F (37.1 C)] 98.2 F (36.8 C) (09/10 0408) Pulse Rate:  [60-65] 65 (09/10 0408) Cardiac Rhythm:  [-] Sinus bradycardia (09/10 0818) Resp:  [18-19] 18 (09/10 0408) BP: (138-154)/(42-47) 138/42 mmHg (09/10 0408) SpO2:  [97 %-98 %] 97 % (09/10 0408)  Intake/Output from previous day: 09/09 0701 - 09/10 0700 In: 290 [P.O.:290] Out: 1650 [Urine:1650] Intake/Output this shift: Total I/O In: 240 [P.O.:240] Out: -   General appearance: alert, cooperative and no distress Heart: irregularly irregular rhythm Lungs: wheezes bilaterally Abdomen: soft, non-tender; bowel sounds normal; no masses,  no organomegaly Extremities: extremities normal, atraumatic, no cyanosis or edema Wound: clean and dry  Lab Results:  Recent Labs  11/20/14 1833 11/21/14 0542  WBC 4.6 5.3  HGB 10.0* 13.4  HCT 33.6* 43.1  PLT 76* 99*   BMET:  Recent Labs  11/20/14 1833  NA 141  K 4.2  CL 98*  CO2 36*  GLUCOSE 106*  BUN 8  CREATININE 0.61  CALCIUM 9.4    PT/INR:  Recent Labs  11/21/14 0542  LABPROT 13.9  INR 1.05   ABG    Component Value Date/Time   TCO2 39 12/28/2013 1041   CBG (last 3)  No results for input(s): GLUCAP in the last 72 hours.  Assessment/Plan:  1. S/P Pigtail catheter for basilar pneumothorax- no air leak appreciated, no pneumothorax on CXR- will leave tube to suction today per PVT sign out- will transition to water seal tomorrow if remains stable 2. Pulm-severe COPD, on home  oxygen, continue nebs prn 3. CV- hemodynamically stable, H/O chronic A. Fib- continue home medications 4. GU- + uti per dipstick sent last night, likely pyelonephritis- will start IV Cipro, culture sent will transition ABX per sensitivities once obtained 5. Dispo- patient stable, will decrease IV fluids, start Cipro for UTI, CT on suction repeat CXR in AM   LOS: 3 days    [name] [name] 11/23/2014   Chart reviewed, patient examined, agree with above. She has a few bubbles coming through when she coughs. If the leak is small or resolved tomorrow she should be able to go to water seal.

## 2014-11-23 NOTE — Progress Notes (Signed)
Physical Therapy Treatment Patient Details Name: [name] [name] MRN: [id number] DOB: 1940-11-28 Today's Date: 11/23/2014    History of Present Illness 74 y.o. female admitted to [facility] on 11/20/14 for SOB.  Pt found to havea basilar pneumothorax. pigtail catheter placement on 11/21/14.  Pt with significant PMHx of recurrent PTX, HTN, SOB, COPD, anemia, Home O2 at 4 L O2 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    PT Comments    Patient eager to walk today. Overall very steady with only supervision for safety and multiple pieces of equipment to manage. SaO2 dropped to 85% on 2L, however returned to 90% in <60 seconds with rest. Pt reports things have been going well at home and she's been able to care for herself with son's intermittent assist. She denies falls or near falls. She states she was fine at home until her breathing suddenly worsened and she had HHPT call EMS.   Follow Up Recommendations  Home health PT;Supervision - Intermittent     Equipment Recommendations  None recommended by PT    Recommendations for Other Services       Precautions / Restrictions Precautions Precautions: Fall Precaution Comments: monitor O2 sats, chest tube Restrictions Weight Bearing Restrictions: No    Mobility  Bed Mobility Overal bed mobility: Modified Independent             General bed mobility comments: pt able to achieve long sitting and then transition to EOB   Transfers Overall transfer level: Modified independent Equipment used: Rolling walker (2 wheeled) Transfers: Sit to/from Stand Sit to Stand: Modified independent (Device/Increase time)         General transfer comment: from bed and back to chair; assist to manage chest tube, IV pole, and O2 tank  Ambulation/Gait Ambulation/Gait assistance: Supervision Ambulation Distance (Feet): 120 Feet Assistive device: Rolling  walker (2 wheeled) Gait Pattern/deviations: Step-through pattern;Decreased stride length     General Gait Details: slow however steady with proper use of RW (although at home pt uses 4 wheel walker)   Stairs            Wheelchair Mobility    Modified Rankin (Stroke Patients Only)       Balance     Sitting balance-Leahy Scale: Good       Standing balance-Leahy Scale: Fair                      Cognition Arousal/Alertness: Awake/alert Behavior During Therapy: WFL for tasks assessed/performed Overall Cognitive Status: Within Functional Limits for tasks assessed                 General Comments: pt very patient as multiple lines/lequipment prepared for her to move    Exercises      General Comments        Pertinent Vitals/Pain Pain Assessment: Faces Faces Pain Scale: Hurts a little bit Pain Location: Rt chest Pain Intervention(s): Limited activity within patient's tolerance;Monitored during session    Home Living                      Prior Function            PT Goals (current goals can now be found in the care plan section) Acute Rehab PT Goals Patient Stated Goal: to feel better and stop coming into the hospital all the time.  Progress towards PT goals:  Progressing toward goals    Frequency  Min 3X/week    PT Plan Discharge plan needs to be updated    Co-evaluation             End of Session Equipment Utilized During Treatment: Oxygen Activity Tolerance: Patient tolerated treatment well Patient left: in chair;with call bell/phone within reach;with chair alarm set     Time: 1130-1159 PT Time Calculation (min) (ACUTE ONLY): 29 min  Charges:  $Gait Training: 23-37 mins                    G Codes:      Brayden Brodhead 2014-12-20, 12:40 PM  Pager [phone]

## 2014-11-24 ENCOUNTER — Inpatient Hospital Stay (HOSPITAL_COMMUNITY): Payer: Commercial Managed Care - HMO

## 2014-11-24 LAB — URINE CULTURE: Culture: NO GROWTH

## 2014-11-24 MED ORDER — PHENAZOPYRIDINE HCL 100 MG PO TABS
100.0000 mg | ORAL_TABLET | Freq: Three times a day (TID) | ORAL | Status: AC
  Administered 2014-11-24 – 2014-11-26 (×6): 100 mg via ORAL
  Filled 2014-11-24 (×6): qty 1

## 2014-11-24 NOTE — Progress Notes (Addendum)
      [address]       [place] [address]             [phone]      Subjective:  Ms. Strycharz complains of dysuria this morning.  She states its every time she goes to the bathroom  Objective: Vital signs in last 24 hours: Temp:  [97.9 F (36.6 C)-98.6 F (37 C)] 97.9 F (36.6 C) (09/11 0409) Pulse Rate:  [68-72] 70 (09/11 0409) Cardiac Rhythm:  [-] Sinus bradycardia;Normal sinus rhythm (09/11 0831) Resp:  [16-17] 16 (09/11 0409) BP: (143-154)/(48-58) 154/48 mmHg (09/11 0409) SpO2:  [94 %-97 %] 97 % (09/11 0409)  Intake/Output from previous day: 09/10 0701 - 09/11 0700 In: 720 [P.O.:720] Out: 2150 [Urine:2150] Intake/Output this shift: Total I/O In: 240 [P.O.:240] Out: 300 [Urine:300]  General appearance: alert, cooperative and no distress Heart: regular rate and rhythm Lungs: clear to auscultation bilaterally Abdomen: soft, non-tender; bowel sounds normal; no masses,  no organomegaly Wound: clean and dry  Lab Results: No results for input(s): WBC, HGB, HCT, PLT in the last 72 hours. BMET: No results for input(s): NA, K, CL, CO2, GLUCOSE, BUN, CREATININE, CALCIUM in the last 72 hours.  PT/INR: No results for input(s): LABPROT, INR in the last 72 hours. ABG    Component Value Date/Time   TCO2 39 12/28/2013 1041   CBG (last 3)  No results for input(s): GLUCAP in the last 72 hours.  Assessment/Plan:  1. Chest tube- no air leak appreciated, CXR with redevelopment of tiny left right basilar pneumothorax- possibly transition chest tube to water seal 2. Pulm- severe COPD, on oxygen at home, continue current regimen 3. GU- + UTI per dipstick, Urine culture negative to date- continue Cipro, can start Pyridium for discomfort 4. Dispo- patient stable, will discuss chest tube management with Dr. Cyndia Bent, continue Cipro   LOS: 4 days    Ellwood Handler 11/24/2014   Chart reviewed, patient examined, agree with above. The CXR today does show a new  right basilar ptx and I can see bubbles with cough this afternoon. Will keep on suction.

## 2014-11-25 ENCOUNTER — Inpatient Hospital Stay (HOSPITAL_COMMUNITY): Payer: Commercial Managed Care - HMO

## 2014-11-25 MED ORDER — CIPROFLOXACIN HCL 500 MG PO TABS
500.0000 mg | ORAL_TABLET | Freq: Two times a day (BID) | ORAL | Status: DC
  Administered 2014-11-25 – 2014-11-28 (×7): 500 mg via ORAL
  Filled 2014-11-25 (×11): qty 1

## 2014-11-25 NOTE — Progress Notes (Signed)
Physical Therapy Treatment Patient Details Name: [name] [name] MRN: [id number] DOB: 06-Sep-1940 Today's Date: 11/25/2014    History of Present Illness 74 y.o. female admitted to [facility] on 11/20/14 for SOB.  Pt found to havea basilar pneumothorax. pigtail catheter placement on 11/21/14.  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    PT Comments    Pt did well with rollator with o2 at 97% on 2 L/min with slow cadence.  Pt fatigued after gait and unable to do any therex for strengthening of legs.    Follow Up Recommendations  Home health PT;Supervision - Intermittent     Equipment Recommendations  None recommended by PT    Recommendations for Other Services       Precautions / Restrictions Precautions Precautions: Fall Precaution Comments: monitor O2 sats, chest tube Restrictions Weight Bearing Restrictions: No    Mobility  Bed Mobility Overal bed mobility: Needs Assistance Bed Mobility: Supine to Sit     Supine to sit: Supervision Sit to supine: Supervision   General bed mobility comments: for lines  Transfers Overall transfer level: Needs assistance Equipment used: 4-wheeled walker Transfers: Sit to/from Stand Sit to Stand: Supervision         General transfer comment: S due to IV, o2 and chest tube lines  Ambulation/Gait Ambulation/Gait assistance: Supervision Ambulation Distance (Feet): 120 Feet Assistive device: 4-wheeled walker Gait Pattern/deviations: Step-through pattern Gait velocity: Decreased Gait velocity interpretation: Below normal speed for age/gender General Gait Details: did well with rollator with slow steady cadence.  on 2 L/min and o2 97% during gait.   Stairs            Wheelchair Mobility    Modified Rankin (Stroke Patients Only)       Balance    Sitting-balance support: Feet supported Sitting balance-Leahy Scale: Good     Standing balance support: Bilateral upper extremity supported Standing balance-Leahy Scale: Fair                      Cognition Arousal/Alertness: Awake/alert Behavior During Therapy: WFL for tasks assessed/performed Overall Cognitive Status: Within Functional Limits for tasks assessed                      Exercises      General Comments General comments (skin integrity, edema, etc.): Pt fatigued after gait and requesting breathing treatment. Pt declined ther ex.      Pertinent Vitals/Pain Pain Assessment: No/denies pain Pain Score: 3  Pain Location: peri area and buttocks Pain Descriptors / Indicators: Burning Pain Intervention(s): Monitored during session (notified nurse)    Home Living                      Prior Function            PT Goals (current goals can now be found in the care plan section) Acute Rehab PT Goals Patient Stated Goal: to be independent and go home PT Goal Formulation: With patient Time For Goal Achievement: 12/05/14 Potential to Achieve Goals: Good Progress towards PT goals: Progressing toward goals    Frequency  Min 3X/week    PT Plan Current plan remains appropriate    Co-evaluation             End of  Session Equipment Utilized During Treatment: Oxygen Activity Tolerance: Patient limited by fatigue;Patient tolerated treatment well Patient left: in chair;with call bell/phone within reach;with chair alarm set     Time: 4431-5400 PT Time Calculation (min) (ACUTE ONLY): 20 min  Charges:  $Gait Training: 8-22 mins                    G Codes:      Serrina Minogue LUBECK 11/25/2014, 1:04 PM

## 2014-11-25 NOTE — Progress Notes (Signed)
Utilization review completed.  

## 2014-11-25 NOTE — Progress Notes (Addendum)
      [address]       [place] [address]             [phone]      Subjective:  Ms. Corkern complains of pain at chest tube site.  She also continues to have dysuria despite pyridium and Cipro.  Objective: Vital signs in last 24 hours: Temp:  [97.4 F (36.3 C)-98.2 F (36.8 C)] 97.4 F (36.3 C) (09/12 0517) Pulse Rate:  [64-79] 64 (09/12 0517) Cardiac Rhythm:  [-] Sinus bradycardia;Normal sinus rhythm (09/11 1936) Resp:  [16-17] 16 (09/12 0517) BP: (145-168)/(48-63) 160/54 mmHg (09/12 0517) SpO2:  [95 %-99 %] 99 % (09/12 0517)  Intake/Output from previous day: 09/11 0701 - 09/12 0700 In: 600 [P.O.:600] Out: 3515 [Urine:3475; Chest Tube:40]  General appearance: alert, cooperative and no distress Heart: regular rate and rhythm Lungs: wheezes bilaterally Abdomen: soft, non-tender; bowel sounds normal; no masses,  no organomegaly Wound: clean and dry  Lab Results: No results for input(s): WBC, HGB, HCT, PLT in the last 72 hours. BMET: No results for input(s): NA, K, CL, CO2, GLUCOSE, BUN, CREATININE, CALCIUM in the last 72 hours.  PT/INR: No results for input(s): LABPROT, INR in the last 72 hours. ABG    Component Value Date/Time   TCO2 39 12/28/2013 1041   CBG (last 3)  No results for input(s): GLUCAP in the last 72 hours.  Assessment/Plan:  1. Chest tube- no air leak appreciated, CXR with continued basilar pneumothorax- on suction for now, will discuss management with [name] 2. Pulm- severe COPD, continue current care 3. GU- + UTI per dipstick, Urine culture is negative- on IV Cipro and Pyridium without relief of symptoms, will send fresh urine for repeat culture 4. Dispo- patient stable, chest tube management per staff   LOS: 5 days    [name], [name] 11/25/2014  Minimal R basilar PNTX Reduce suction to 10 cm Treat prob UTI with po cipro  patient examined and medical record reviewed,agree with above note. Tharon Aquas Trigt  III 11/25/2014

## 2014-11-25 NOTE — Progress Notes (Signed)
Occupational Therapy Treatment Patient Details Name: [name] [name] MRN: [id number] DOB: 05/22/1940 Today's Date: 11/25/2014    History of present illness 74 y.o. female admitted to [facility] on 11/20/14 for SOB.  Pt found to havea basilar pneumothorax. pigtail catheter placement on 11/21/14.  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   OT comments  Pt progressing. Education provided in session.  Follow Up Recommendations  Home health OT    Equipment Recommendations  None recommended by OT    Recommendations for Other Services      Precautions / Restrictions Precautions Precautions: Fall Precaution Comments: monitor O2 sats, chest tube Restrictions Weight Bearing Restrictions: No       Mobility Bed Mobility Overal bed mobility: Needs Assistance Bed Mobility: Supine to Sit;Sit to Supine     Supine to sit: Supervision Sit to supine: Supervision   General bed mobility comments: for lines  Transfers Overall transfer level: Needs assistance   Transfers: Sit to/from Stand Sit to Stand/Stand to Sit: Modified independent (Device/Increase time);Supervision               Balance  No LOB in session. Used RW for ambulation.                                 ADL Overall ADL's : Needs assistance/impaired     Grooming: Applying deodorant;Brushing hair;Oral care;Set up;Supervision/safety;Standing                   Toilet Transfer: Supervision/safety;Ambulation;RW;BSC   Toileting- Water quality scientist and Hygiene: Supervision/safety;Sit to/from stand       Functional mobility during ADLs: Supervision/safety;Rolling walker General ADL Comments: educated on energy conservation techniques.      Vision                     Perception     Praxis      Cognition   Awake/Alert Behavior During Therapy: WFL for tasks assessed/performed Overall Cognitive Status: Within Functional Limits for tasks assessed   Monitor reading that pt's O2 sats in 70s-80s in session with her on 2L of O2 but unsure of accuracy. O2 went up to 90s. Pt's O2 in 90s at end of session.                       Extremity/Trunk Assessment               Exercises     Shoulder Instructions       General Comments      Pertinent Vitals/ Pain       Pain Assessment: 0-10 Pain Score: 3  Pain Location: peri area and buttocks Pain Descriptors / Indicators: Burning Pain Intervention(s): Monitored during session (notified nurse)  Home Living                                          Prior Functioning/Environment              Frequency Min 2X/week     Progress Toward Goals  OT Goals(current goals can now be found in the care plan section)  Progress towards OT  goals: Progressing toward goals  Acute Rehab OT Goals Patient Stated Goal: to be independent and go home OT Goal Formulation: With patient Time For Goal Achievement: 12/06/14 Potential to Achieve Goals: Good  Plan Discharge plan remains appropriate    Co-evaluation                 End of Session Equipment Utilized During Treatment: Gait belt;Oxygen;Rolling walker   Activity Tolerance Patient tolerated treatment well   Patient Left in bed;with call bell/phone within reach;with bed alarm set   Nurse Communication Other (comment) (pt had burning)        Time: 6701-4103 OT Time Calculation (min): 19 min  Charges: OT General Charges $OT Visit: 1 Procedure OT Treatments $Self Care/Home Management : 8-22 mins  Benito Mccreedy OTR/L [phone] 11/25/2014, 12:36 PM

## 2014-11-26 ENCOUNTER — Ambulatory Visit: Payer: Commercial Managed Care - HMO | Admitting: *Deleted

## 2014-11-26 ENCOUNTER — Inpatient Hospital Stay (HOSPITAL_COMMUNITY): Payer: Commercial Managed Care - HMO

## 2014-11-26 ENCOUNTER — Encounter: Payer: Self-pay | Admitting: Licensed Clinical Social Worker

## 2014-11-26 MED ORDER — ALBUTEROL SULFATE (2.5 MG/3ML) 0.083% IN NEBU
2.5000 mg | INHALATION_SOLUTION | Freq: Four times a day (QID) | RESPIRATORY_TRACT | Status: DC
  Administered 2014-11-27 – 2014-11-28 (×5): 2.5 mg via RESPIRATORY_TRACT
  Filled 2014-11-26 (×6): qty 3

## 2014-11-26 NOTE — Patient Outreach (Signed)
[place] [facility]) Care Management  [facility] Social Work  11/26/2014  [name] [name] 10/15/1940 [id number]    Current Medications:  No current facility-administered medications for this visit.   No current outpatient prescriptions on file.   Facility-Administered Medications Ordered in Other Visits  Medication Dose Route Frequency Provider Last Rate Last Dose  . acetaminophen (TYLENOL) tablet 650 mg  650 mg Oral Q6H PRN Ivin Poot, MD   650 mg at 11/26/14 0535   Or  . acetaminophen (TYLENOL) suppository 650 mg  650 mg Rectal Q6H PRN Ivin Poot, MD      . albuterol (PROVENTIL) (2.5 MG/3ML) 0.083% nebulizer solution 2.5 mg  2.5 mg Nebulization Q6H PRN Ivin Poot, MD   2.5 mg at 11/26/14 0827  . alum & mag hydroxide-simeth (MAALOX/MYLANTA) 200-200-20 MG/5ML suspension 30 mL  30 mL Oral Q6H PRN Ivin Poot, MD      . antiseptic oral rinse (CPC / CETYLPYRIDINIUM CHLORIDE 0.05%) solution 7 mL  7 mL Mouth Rinse BID Ivin Poot, MD   7 mL at 11/26/14 1000  . aspirin EC tablet 81 mg  81 mg Oral Daily Ivin Poot, MD   81 mg at 11/26/14 1103  . ciprofloxacin (CIPRO) tablet 500 mg  500 mg Oral BID [name] R [name], PA-C   500 mg at 11/26/14 0754  . dextrose 5 % and 0.45 % NaCl with KCl 10 mEq/L infusion   Intravenous Continuous Ivin Poot, MD 30 mL/hr at 11/25/14 2328    . digoxin (LANOXIN) tablet 0.125 mg  0.125 mg Oral Daily Ivin Poot, MD   0.125 mg at 11/26/14 1104  . diltiazem (CARDIZEM CD) 24 hr capsule 240 mg  240 mg Oral Daily [name] R [name], PA-C   240 mg at 11/26/14 1105  . docusate sodium (COLACE) capsule 100 mg  100 mg Oral BID PRN Ivin Poot, MD      . enoxaparin (LOVENOX) injection 40 mg  40 mg Subcutaneous QHS Ivin Poot, MD   40 mg at 11/25/14 2303  . fentaNYL (SUBLIMAZE) injection 12.5 mcg  12.5 mcg Intravenous Once Ivin Poot, MD      . guaiFENesin-dextromethorphan [facility] DM) 100-10 MG/5ML syrup 5 mL  5 mL Oral Q4H  PRN Ivin Poot, MD   5 mL at 11/20/14 2355  . Influenza vac split quadrivalent PF (FLUARIX) injection 0.5 mL  0.5 mL Intramuscular Tomorrow-1000 Ivin Poot, MD   0.5 mL at 11/22/14 1000  . magnesium hydroxide (MILK OF MAGNESIA) suspension 15 mL  15 mL Oral Daily PRN Ivin Poot, MD      . metoprolol succinate (TOPROL-XL) 24 hr tablet 12.5 mg  12.5 mg Oral Daily [name] R [name], PA-C   12.5 mg at 11/26/14 1104  . ondansetron (ZOFRAN) tablet 4 mg  4 mg Oral Q6H PRN Ivin Poot, MD   4 mg at 11/21/14 0926   Or  . ondansetron [facility]) injection 4 mg  4 mg Intravenous Q6H PRN Ivin Poot, MD   4 mg at 11/22/14 0815  . sodium chloride 0.9 % injection 3 mL  3 mL Intravenous Q12H Ivin Poot, MD   3 mL at 11/22/14 2114  . sorbitol 70 % solution 30 mL  30 mL Oral Daily PRN Ivin Poot, MD      . traMADol Veatrice Bourbon) tablet 50 mg  50 mg Oral Q6H PRN Ivin Poot, MD  50 mg at 11/26/14 0754    Functional Status:  In your present state of health, do you have any difficulty performing the following activities: 11/21/2014 11/19/2014  Hearing? [name]  Vision? N N  Difficulty concentrating or making decisions? N N  Walking or climbing stairs? N Y  Dressing or bathing? N Y  Doing errands, shopping? Y Y  Conservation officer, nature and eating ? - N  Using the Toilet? - N  In the past six months, have you accidently leaked urine? - Y  Do you have problems with loss of bowel control? - N  Managing your Medications? - N  Managing your Finances? - N  Housekeeping or managing your Housekeeping? - Y    Fall/Depression Screening:  PHQ 2/9 Scores 11/19/2014 10/25/2014 10/07/2014 10/02/2014 09/20/2014 06/20/2014  PHQ - 2 Score 0 0 0 0 0 0    Assessment:  [name] completed chart review and became aware that patient is still in hospital at [facility]. [name] contacted patient's PCP to hear back on personal care services. [name] spoke to Dr. Cristela Blue [name]'s nurse [name]. HIPPA verifications were received. [name] reports  that she did receive personal care service application and that Dr. Lisbeth Ply is aware that patient is in the hospital as she missed scheduled appointment last week. [name] reported that Dr. Lisbeth Ply will not be able to complete personal care service application until patient is out of the hospital and is able to complete an office visit. [name] questioned if [name] could fax over personal care service application again at the time of hospital discharge. Contact information was both received and given.  Plan-[name] will fax another personal care service application once patient has discharged from hospital.  .[name], [place], MSW, [email or web address] Phone: [phone] Fax: [phone]    Plan:

## 2014-11-26 NOTE — Progress Notes (Addendum)
      [address]       ,[place] [address]             [phone]      Subjective:  Ms. Sautter complains of headache this morning.  She continues to have dysuria.  Objective: Vital signs in last 24 hours: Temp:  [98 F (36.7 C)-98.6 F (37 C)] 98 F (36.7 C) (09/13 0449) Pulse Rate:  [62-72] 62 (09/13 0449) Cardiac Rhythm:  [-] Sinus bradycardia;Normal sinus rhythm (09/12 1955) Resp:  [16-18] 17 (09/13 0449) BP: (124-165)/(46-62) 124/51 mmHg (09/13 0449) SpO2:  [97 %-100 %] 99 % (09/13 0449)  Intake/Output from previous day: 09/12 0701 - 09/13 0700 In: 1504 [P.O.:1004; I.V.:500] Out: 2362 [Urine:2350; Chest Tube:12]  General appearance: alert, cooperative and no distress Heart: regular rate and rhythm Lungs: clear to auscultation bilaterally Abdomen: soft, non-tender; bowel sounds normal; no masses,  no organomegaly Wound: clean and dry  Lab Results: No results for input(s): WBC, HGB, HCT, PLT in the last 72 hours. BMET: No results for input(s): NA, K, CL, CO2, GLUCOSE, BUN, CREATININE, CALCIUM in the last 72 hours.  PT/INR: No results for input(s): LABPROT, INR in the last 72 hours. ABG    Component Value Date/Time   TCO2 39 12/28/2013 1041   CBG (last 3)  No results for input(s): GLUCAP in the last 72 hours.  Assessment/Plan:  1. Chest tube- no air leak, CXR free from pneumothorax- will transition to water seal 2. GU- + uti, on Cipro, continued dysuria, repeat culture pending 3. Pulm- severe COPD, continue nebs, oxygen at home 4. Dispo- patient stable, chest tube to water seal, if remains stable can transition to [facility] tomorrow   LOS: 6 days    Ellwood Handler 11/26/2014 cxr is good, no air leak patient examined and medical record reviewed,agree with above note. [name] 11/26/2014

## 2014-11-26 NOTE — Progress Notes (Signed)
Physical Therapy Treatment Patient Details Name: [name] [name] MRN: [id number] DOB: 11-Mar-1941 Today's Date: 11/26/2014    History of Present Illness 74 y.o. female admitted to [facility] on 11/20/14 for SOB.  Pt found to havea basilar pneumothorax. pigtail catheter placement on 11/21/14.  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    PT Comments    Pt progressing towards physical therapy goals. Was eager to participate with therapy today and was able to ambulate well with the rollator. Pt reports decreased pain at chest tube site vs. last session. Will continue to follow and progress as able per POC.   Follow Up Recommendations  Home health PT;Supervision - Intermittent     Equipment Recommendations  None recommended by PT    Recommendations for Other Services       Precautions / Restrictions Precautions Precautions: Fall Precaution Comments: monitor O2 sats, chest tube Restrictions Weight Bearing Restrictions: No    Mobility  Bed Mobility               General bed mobility comments: Pt sitting up in the recliner upon PT arrival.  Transfers Overall transfer level: Needs assistance Equipment used: 4-wheeled walker Transfers: Sit to/from Stand Sit to Stand: Supervision         General transfer comment: Supervision for chest tube and IV  Ambulation/Gait Ambulation/Gait assistance: Supervision Ambulation Distance (Feet): 200 Feet Assistive device: 4-wheeled walker Gait Pattern/deviations: Step-through pattern;Decreased stride length;Trunk flexed Gait velocity: Decreased Gait velocity interpretation: Below normal speed for age/gender General Gait Details: 2 short standing rest beraks due to fatigue. Pt was able to ambulate well with the rollator for support. Did not need a seated rest  break.    Stairs            Wheelchair Mobility    Modified Rankin (Stroke Patients Only)       Balance Overall balance assessment: Needs assistance Sitting-balance support: Feet supported;No upper extremity supported Sitting balance-Leahy Scale: Good                              Cognition Arousal/Alertness: Awake/alert Behavior During Therapy: WFL for tasks assessed/performed Overall Cognitive Status: Within Functional Limits for tasks assessed                      Exercises General Exercises - Lower Extremity Long Arc Quad: 15 reps    General Comments        Pertinent Vitals/Pain Pain Assessment: No/denies pain    Home Living                      Prior Function            PT Goals (current goals can now be found in the care plan section) Acute Rehab PT Goals Patient Stated Goal: to be independent and go home PT Goal Formulation: With patient Time For Goal Achievement: 12/05/14 Potential to Achieve Goals: Good Progress towards PT goals: Progressing toward goals    Frequency  Min 3X/week    PT Plan Current plan remains appropriate    Co-evaluation             End of Session Equipment Utilized During Treatment: Gait belt;Oxygen Activity Tolerance: Patient tolerated treatment  well Patient left: with call bell/phone within reach;in bed     Time: 1023-1050 PT Time Calculation (min) (ACUTE ONLY): 27 min  Charges:  $Gait Training: 8-22 mins $Therapeutic Activity: 8-22 mins                    G Codes:      Rolinda Roan 2014-12-05, 2:07 PM   Rolinda Roan, PT, DPT Acute Rehabilitation Services Pager: [phone]

## 2014-11-27 ENCOUNTER — Encounter (HOSPITAL_COMMUNITY): Payer: Self-pay | Admitting: Radiology

## 2014-11-27 ENCOUNTER — Inpatient Hospital Stay (HOSPITAL_COMMUNITY): Payer: Commercial Managed Care - HMO

## 2014-11-27 ENCOUNTER — Encounter: Payer: Commercial Managed Care - HMO | Admitting: Cardiothoracic Surgery

## 2014-11-27 LAB — CREATININE, SERUM
Creatinine, Ser: 0.52 mg/dL (ref 0.44–1.00)
GFR calc Af Amer: >60 mL/min (ref >60)
GFR calc non Af Amer: >60 mL/min (ref >60)

## 2014-11-27 LAB — URINE CULTURE

## 2014-11-27 MED ORDER — IOHEXOL 300 MG/ML  SOLN
50.0000 mL | Freq: Once | INTRAMUSCULAR | Status: AC | PRN
  Administered 2014-11-27: 75 mL via INTRAVENOUS

## 2014-11-27 NOTE — Care Management Note (Addendum)
Case Management Note CM note started by Jacqlyn Krauss RNCM  Patient Details  Name: [name] [name] MRN: [id number] Date of Birth: [birth year]  Subjective/Objective: Pt admitted for Respiratory Distress. Pt is from home alone. Has support of two sons.                     Action/Plan: CM did speak with [name] with permission of pt. Per son pt has [facility] and is active with [place] for RN, PT services. CM did call Liaison with [place] to verify information. Liaison to call this CM back. CM did ask for PT/ OT evaluation- pt may benefit from higher level of care. Will continue to monitor.   Expected Discharge Date:                  Expected Discharge Plan:  [place]  In-House Referral:  Clinical Social Work  Discharge planning Services  CM Consult  Post Acute Care Choice:    Choice offered to:     DME Arranged:    DME Agency:     HH Arranged:    [place] Agency:  [place]  Status of Service:  In process, will continue to follow  Medicare Important Message Given:  Yes-third notification given Date Medicare IM Given:    Medicare IM give by:    Date Additional Medicare IM Given:    Additional Medicare Important Message give by:     If discussed at Long Length of Stay Meetings, dates discussed:    11/27/14- [name] RNBSN- noted in chart plan for pt to return home with CT-to mini express- will need [facility] for this- pt was active with [name]- however they are not willing to take her back with Chest tube in place- spoke with [name] from [place] to discuss concerns that [place] has- and per conversation [name] states that their concerns are from a safety standpoint with pt being at home alone with a chest tube and has little support at home. Has home 02 with [name] and per pt she uses a rollator at home.  Pt is active with [name] also - and when speaking with [name] from [facility] they share the same concerns regarding the  chest tube. Spoke with pt at bedside and pt states that she does NOT want to go back to a STSNF- she wants to return home and does not have a preference on a [place] agency if [name] is not agreeable to continue to follow her. She voices that she has been with them a long time and would like to continue however she wants CM to get someone who is willing to follow her with the Chest Tube. Also reached out to pt's son who also feels pt would be safer in a STSNF while chest tube is in place and would prefer one in [place]- explained that pt would have to be agreeable to this - which currently she is not-  Have spoken with [name] to see if pt would even be eligible for SNF if agreeable and [name] to f/u. Have reached out to [facility] with [name]- to see if [facility] would be willing to take pt as a referral for chest tube management and [place] services- per [name] they are agreeable to receive referral with known safety concerns.  Will need HH RN/PT  Orders for [facility]- will chest tube management instructions.    Additional Comments: 1214 11-21-14 Jacqlyn Krauss, RN,BSN  [phone] CM did speak to [place] and they can take pt as long she will not have a drain. CM will continue to monitor.   Dahlia Client [place], RN 11/27/2014, 4:02 PM

## 2014-11-27 NOTE — Progress Notes (Addendum)
      [address]       ,[place] [address]             [phone]      Subjective:  Ms. Boyington continues to complain of headache.  She states she takes tramadol at home and is requesting it here.    Objective: Vital signs in last 24 hours: Temp:  [97.7 F (36.5 C)-98.2 F (36.8 C)] 97.7 F (36.5 C) (09/14 0450) Pulse Rate:  [65-76] 70 (09/14 0450) Cardiac Rhythm:  [-] Normal sinus rhythm (09/14 0700) Resp:  [16-17] 16 (09/14 0450) BP: (125-164)/(44-52) 164/44 mmHg (09/14 0450) SpO2:  [96 %-100 %] 100 % (09/14 0450)  Intake/Output from previous day: 09/13 0701 - 09/14 0700 In: 9450 [P.O.:836; I.V.:995] Out: 1500 [Urine:1500]  General appearance: alert, cooperative and no distress Heart: regular rate and rhythm Lungs: clear to auscultation bilaterally Abdomen: soft, non-tender; bowel sounds normal; no masses,  no organomegaly Wound: clean and dry  Lab Results: No results for input(s): WBC, HGB, HCT, PLT in the last 72 hours. BMET:  Recent Labs  11/27/14 0500  CREATININE 0.52    PT/INR: No results for input(s): LABPROT, INR in the last 72 hours. ABG    Component Value Date/Time   TCO2 39 12/28/2013 1041   CBG (last 3)  No results for input(s): GLUCAP in the last 72 hours.  Assessment/Plan:  1. Chest tube- no air leak appreciated on water seal, CXR with a small basilar pneumothorax- will transition to [facility] if okay with [name] 2. Pulm- severe COPD, on home oxygen, continue nebs 3. GU- + UTI on cipro, patient continues to have dysuria 4. Headache- will add Tramadol per patient request 5. Dispo- patient stable, CXR with basilar pneumothorax, possibly transition to mini express   LOS: 7 days    [name], [name] 11/27/2014  Transition to mini express Tramadol for headache- head CT 2013 normal- will recheck patient examined and medical record reviewed,agree with above note.  Patient will need HHN to care for chest tube at  home [place] III 11/27/2014

## 2014-11-27 NOTE — Progress Notes (Signed)
Referring Physician(s): Prescott Gum  Chief Complaint:  Rt recurrent PTX Chest tube placed 9/8  Subjective:  Pt asymptomatic Breathing well Ambulating  Chest tube has been exchanged to [facility] by Dr Prescott Gum Expect dc to home tomorrow---possibly   Allergies: Eggs or egg-derived products  Medications: Prior to Admission medications   Medication Sig Start Date End Date Taking? Authorizing Provider  albuterol (PROVENTIL) (2.5 MG/3ML) 0.083% nebulizer solution Take 2.5 mg by nebulization every 6 (six) hours as needed for wheezing or shortness of breath.    Yes Historical Provider, MD  atorvastatin (LIPITOR) 40 MG tablet Take 40 mg by mouth every morning.  07/02/13  Yes Historical Provider, MD  budesonide-formoterol (SYMBICORT) 160-4.5 MCG/ACT inhaler Inhale 2 puffs into the lungs 2 (two) times daily. 08/06/13  Yes Barton Dubois, MD  digoxin (LANOXIN) 0.25 MG tablet Take 0.125 mg by mouth every morning.    Yes Historical Provider, MD  diltiazem (CARDIZEM CD) 240 MG 24 hr capsule Take 240 mg by mouth every morning.  04/08/14  Yes Historical Provider, MD  docusate sodium (COLACE) 100 MG capsule Take 100 mg by mouth daily as needed (constipation).    Yes Historical Provider, MD  Ferrous Sulfate (IRON) 325 (65 FE) MG TABS Take 325 mg by mouth daily.   Yes Historical Provider, MD  fluticasone (FLONASE) 50 MCG/ACT nasal spray Place 1 spray into both nostrils daily as needed for allergies or rhinitis. 08/06/13  Yes Barton Dubois, MD  furosemide (LASIX) 20 MG tablet Take 1 tablet (20 mg total) by mouth daily. 06/13/14  Yes Theodis Blaze, MD  metoprolol succinate (TOPROL-XL) 50 MG 24 hr tablet Take 12.5 mg by mouth daily. Take with or immediately following a meal.   Yes Historical Provider, MD  Omega-3 Fatty Acids (FISH OIL) 1000 MG CAPS Take 1,000 capsules by mouth daily.   Yes Historical Provider, MD  oxybutynin (DITROPAN-XL) 5 MG 24 hr tablet Take 10 mg by mouth at bedtime.   Yes  Historical Provider, MD  pantoprazole (PROTONIX) 40 MG tablet Take 1 tablet (40 mg total) by mouth daily. 10/21/87  Yes Delora Fuel, MD  rivaroxaban (XARELTO) 20 MG TABS tablet Take 20 mg by mouth daily with supper.   Yes Historical Provider, MD  tiotropium (SPIRIVA) 18 MCG inhalation capsule Place 1 capsule (18 mcg total) into inhaler and inhale daily. 08/06/13  Yes Barton Dubois, MD  traMADol (ULTRAM) 50 MG tablet Take 1 tablet (50 mg total) by mouth every 6 (six) hours as needed for moderate pain (pain). Patient taking differently: Take 50 mg by mouth at bedtime as needed for moderate pain (pain).  09/19/14  Yes Donielle Liston Alba, PA-C  VENTOLIN HFA 108 (90 BASE) MCG/ACT inhaler Inhale 2 puffs into the lungs 4 (four) times daily as needed for wheezing or shortness of breath.  06/07/14  Yes Historical Provider, MD  vitamin B-12 (CYANOCOBALAMIN) 500 MCG tablet Take 1,000 mcg by mouth daily.    Yes Historical Provider, MD  iron polysaccharides (NIFEREX) 150 MG capsule Take 1 capsule (150 mg total) by mouth daily. 04/29/14   Annita Brod, MD     Vital Signs: BP 145/54 mmHg  Pulse 73  Temp(Src) 97.7 F (36.5 C) (Oral)  Resp 16  Ht 5\' 5"  (1.651 m)  Wt 112 lb 9.6 oz (51.075 kg)  BMI 18.74 kg/m2  SpO2 100%  Physical Exam  Pulmonary/Chest: Effort normal and breath sounds normal. No respiratory distress. She has no wheezes.  Skin:  Skin is warm.  Rt chest tube intact Site clean and dry NT No air leak noted Mini express intact/connected afeb vss   Nursing note and vitals reviewed.  CXR 9/14: IMPRESSION: Postoperative change on the right with thickening in the right apex, stable. No pneumothorax. Subcutaneous air on the right remains. Chest drain on the right is unchanged in position. No new opacity. No change in cardiac silhouette  Imaging: Dg Chest Port 1 View  11/27/2014   CLINICAL DATA:  Right chest tube placement for pneumothorax  EXAM: PORTABLE CHEST - 1 VIEW  COMPARISON:   November 26, 2014  FINDINGS: Chest drain remains on the right unchanged in position. No pneumothorax apparent. There is postoperative change in the right apex with right apical pleural thickening, stable. There is subcutaneous air on the right, stable.  There is no edema or consolidation. The heart size and pulmonary vascularity are normal. There is atherosclerotic change in aorta. No adenopathy.  IMPRESSION: Postoperative change on the right with thickening in the right apex, stable. No pneumothorax. Subcutaneous air on the right remains. Chest drain on the right is unchanged in position. No new opacity. No change in cardiac silhouette.   Electronically Signed   By: Lowella Grip III M.D.   On: 11/27/2014 07:58   Dg Chest Port 1 View  11/26/2014   CLINICAL DATA:  Chest tube.  Pneumothorax.  EXAM: PORTABLE CHEST - 1 VIEW  COMPARISON:  11/25/2014.  FINDINGS: Right chest tube in stable position. No pneumothorax. Mediastinum and hilar structures normal. Heart size normal. Biapical pleural-parenchymal thickening noted consistent with scarring. Subcutaneous emphysema right chest wall. No acute bony abnormality.  IMPRESSION: 1. Right chest tube in stable position. No pneumothorax. Subcutaneous emphysema right chest wall again noted. 2. No acute cardiopulmonary disease. Biapical pleural parenchymal thickening consistent with scarring.   Electronically Signed   By: Marcello Moores  Register   On: 11/26/2014 08:14   Dg Chest Port 1 View  11/25/2014   CLINICAL DATA:  74 year old female with persistent right basilar pneumothorax  EXAM: PORTABLE CHEST - 1 VIEW  COMPARISON:  Prior chest x-ray 11/24/2014  FINDINGS: Right pigtail thoracostomy tube in place. No evidence of residual pneumothorax. Subcutaneous emphysema is again noted along the right lateral chest wall. Surgical changes of prior right upper lobe blebectomy with residual pleural parenchymal scarring an emphysema. Cardiac and mediastinal contours remain unchanged.  The patient is rotated toward the right. Atherosclerotic calcifications noted throughout the transverse aorta. Stable bronchitic change. No acute osseous abnormality. Sequelae of remote healed right proximal humerus fracture.  IMPRESSION: 1. Interval resolution of right basilar pneumothorax with right pigtail thoracostomy tube in place.   Electronically Signed   By: Jacqulynn Cadet M.D.   On: 11/25/2014 08:08   Dg Chest Port 1 View  11/24/2014   CLINICAL DATA:  Pneumothorax, COPD  EXAM: PORTABLE CHEST - 1 VIEW  COMPARISON:  11/23/2014  FINDINGS: Pigtail catheter is reidentified projecting over the right mid lung zone. Trace right basilar pneumothorax persists with right basilar scarring or atelectasis. This may be slightly larger or better seen due to differences in technique. Heart size is normal. Emphysematous changes with biapical pleural thickening reidentified. Left lung grossly clear. No mediastinal shift. Right humeral impaction reidentified.  IMPRESSION: Trace right basilar pneumothorax reidentified, either slightly larger than previously or possibly better seen due to differences in technique.   Electronically Signed   By: Conchita Paris M.D.   On: 11/24/2014 08:38    Labs:  CBC:  Recent  Labs  09/17/14 0255 10/09/14 0827 11/20/14 1833 11/21/14 0542  WBC 10.4 5.1 4.6 5.3  HGB 11.6* 13.1 10.0* 13.4  HCT 36.9 43.7 33.6* 43.1  PLT 104* 98* 76* 99*    COAGS:  Recent Labs  04/28/14 1355 04/29/14 0004 04/29/14 0620 04/30/14 0600 06/08/14 1201 06/16/14 1557 11/21/14 0542  INR  --   --   --   --  1.32 1.03 1.05  APTT 50* 86* 61* 50*  --   --   --     BMP:  Recent Labs  09/15/14 0614 09/17/14 0255 10/09/14 0827 11/20/14 1833 11/27/14 0500  NA 137 137 138 141  --   K 3.6 4.7 4.0 4.2  --   CL 95* 97* 99* 98*  --   CO2 34* 32 31 36*  --   GLUCOSE 134* 115* 112* 106*  --   BUN 14 17 16 8   --   CALCIUM 8.8* 8.4* 8.6* 9.4  --   CREATININE 0.63 0.54 0.83 0.61 0.52    GFRNONAA >60 >60 >60 >60 >60  GFRAA >60 >60 >60 >60 >60    LIVER FUNCTION TESTS:  Recent Labs  06/08/14 1319 09/15/14 0614 10/09/14 0827 11/20/14 1833  BILITOT 0.6 0.6 0.4 0.6  AST 21 16 16 15   ALT 13 12* 12* 11*  ALKPHOS 72 62 56 61  PROT 8.0 6.3* 6.6 6.7  ALBUMIN 3.7 2.9* 3.3* 3.2*    Assessment and Plan:  Recurrent Rt PTX Chest tube in place Mini express now per Dr Prescott Gum Plan per Dr VT  Signed: Monia Sabal A 11/27/2014, 2:32 PM   I spent a total of 15 Minutes at the the patient's bedside AND on the patient's hospital floor or unit, greater than 50% of which was counseling/coordinating care for Rt chest tube

## 2014-11-27 NOTE — Care Management Important Message (Signed)
Important Message  Patient Details  Name: [name] [name] MRN: [id number] Date of Birth: 1941-03-04   Medicare Important Message Given:  Yes-third notification given    Nathen May 11/27/2014, 12:03 PM

## 2014-11-27 NOTE — Progress Notes (Signed)
11/27/2014 1400 Chest tube changed to mini express. [place]

## 2014-11-28 ENCOUNTER — Inpatient Hospital Stay (HOSPITAL_COMMUNITY): Payer: Commercial Managed Care - HMO

## 2014-11-28 MED ORDER — CIPROFLOXACIN HCL 500 MG PO TABS: 500.0000 mg | ORAL_TABLET | Freq: Two times a day (BID) | ORAL | Status: DC

## 2014-11-28 NOTE — Progress Notes (Addendum)
      [address]       [place], [address]             [phone]      Subjective:  Ms. Elsey has no complaints this morning.  She is not agreeable to SNF placement.  They have found another H/H agency to assist with her chest tube.  Objective: Vital signs in last 24 hours: Temp:  [97.9 F (36.6 C)-98.2 F (36.8 C)] 97.9 F (36.6 C) (09/15 0503) Pulse Rate:  [72-81] 72 (09/15 0503) Cardiac Rhythm:  [-] Normal sinus rhythm (09/14 1948) Resp:  [17-18] 18 (09/15 0503) BP: (141-149)/(35-58) 149/58 mmHg (09/15 0503) SpO2:  [97 %-100 %] 99 % (09/15 0503)  Intake/Output from previous day: 09/14 0701 - 09/15 0700 In: 480 [P.O.:480] Out: 1000 [Urine:1000]  General appearance: alert, cooperative and no distress Heart: regular rate and rhythm Lungs: clear to auscultation bilaterally Abdomen: soft, non-tender; bowel sounds normal; no masses,  no organomegaly Wound: clean and dry  Lab Results: No results for input(s): WBC, HGB, HCT, PLT in the last 72 hours. BMET:  Recent Labs  11/27/14 0500  CREATININE 0.52    PT/INR: No results for input(s): LABPROT, INR in the last 72 hours. ABG    Component Value Date/Time   TCO2 39 12/28/2013 1041   CBG (last 3)  No results for input(s): GLUCAP in the last 72 hours.  Assessment/Plan:  1. Chest tube- no air leak appreciated, CXR was ordered for 6 AM, not yet completed 2. 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 GU- UTI, continue Cipro 4. Headache- stable, CT head negative for acute process 5. Dispo- patient stable, await CXR results if stable will plan to discharge patient home today...Marland KitchenMarland KitchenMarland Kitchen Patient adamantly refuses SNF, home health has been arranged   LOS: 8 days    Ahmed Prima, [name] 11/28/2014  patient examined and medical record reviewed,agree with above note. [name] 11/28/2014

## 2014-11-28 NOTE — Progress Notes (Signed)
Physical Therapy Treatment Patient Details Name: [name] [name] MRN: [id number] DOB: [birth year] Today's Date: 11/28/2014    History of Present Illness 74 y.o. female admitted to [facility] on 11/20/14 for SOB.  Pt found to havea basilar pneumothorax. pigtail catheter placement on 11/21/14.  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    PT Comments    Pt anticipates home today. Was motivated to work with therapy once more before d/c, and did well managing [id number]. Pt was educated on some safety awareness with managing chest tube and O2 at home.   Follow Up Recommendations  Home health PT;Supervision - Intermittent     Equipment Recommendations  None recommended by PT    Recommendations for Other Services       Precautions / Restrictions Precautions Precautions: Fall Precaution Comments: monitor O2 sats, chest tube Restrictions Weight Bearing Restrictions: No    Mobility  Bed Mobility Overal bed mobility: Modified Independent Bed Mobility: Supine to Sit           General bed mobility comments: No physical assistance required. Pt used bed rails for support.   Transfers Overall transfer level: Needs assistance Equipment used: 4-wheeled walker Transfers: Sit to/from Stand Sit to Stand: Supervision         General transfer comment: Supervision for chest tube and IV  Ambulation/Gait Ambulation/Gait assistance: Modified independent (Device/Increase time) Ambulation Distance (Feet): 250 Feet Assistive device: 4-wheeled walker Gait Pattern/deviations: Step-through pattern;Decreased stride length;Trunk flexed Gait velocity: Decreased Gait velocity interpretation: Below normal speed for age/gender General Gait Details: No rest breaks required. Pt managed well with [id number]. Slightly short of breath at end  of gait training and pt recovered almost immediately upon seated rest break.    Stairs            Wheelchair Mobility    Modified Rankin (Stroke Patients Only)       Balance Overall balance assessment: Needs assistance Sitting-balance support: Feet supported;No upper extremity supported Sitting balance-Leahy Scale: Good     Standing balance support: No upper extremity supported;During functional activity Standing balance-Leahy Scale: Fair                      Cognition Arousal/Alertness: Awake/alert Behavior During Therapy: WFL for tasks assessed/performed Overall Cognitive Status: Within Functional Limits for tasks assessed                      Exercises      General Comments        Pertinent Vitals/Pain Pain Assessment: No/denies pain    Home Living                      Prior Function            PT Goals (current goals can now be found in the care plan section) Acute Rehab PT Goals Patient Stated Goal: to be independent and go home PT Goal Formulation: With patient Time For Goal Achievement: 12/05/14 Potential to Achieve Goals: Good Progress towards PT goals: Progressing toward goals    Frequency  Min 3X/week    PT Plan Current plan remains appropriate    Co-evaluation             End of Session Equipment Utilized During Treatment: Oxygen Activity Tolerance:  Patient tolerated treatment well Patient left: with call bell/phone within reach;with bed alarm set (EOB)     Time: 3729-0211 PT Time Calculation (min) (ACUTE ONLY): 22 min  Charges:  $Gait Training: 8-22 mins                    G Codes:      Rolinda Roan 12/24/2014, 3:06 PM   Rolinda Roan, PT, DPT Acute Rehabilitation Services Pager: [phone]

## 2014-11-28 NOTE — Discharge Instructions (Signed)
Pneumothorax °A pneumothorax, commonly called a collapsed lung, is a condition in which air leaks from a lung and builds up in the space between the lung and the chest wall (pleural space). The air in a pneumothorax is trapped outside the lung and takes up space, preventing the lung from fully expanding. This is a condition that usually occurs suddenly. The buildup of air may be small or large. A small pneumothorax may go away on its own. When a pneumothorax is larger, it will often require medical treatment and hospitalization.  °CAUSES  °A pneumothorax can sometimes happen quickly with no apparent cause. People with underlying lung problems, particularly COPD or emphysema, are at higher risk of pneumothorax. However, pneumothorax can happen quickly even in people with no prior known lung problems. Trauma, surgery, medical procedures, or injury to the chest wall can also cause a pneumothorax. °SIGNS AND SYMPTOMS  °Sometimes a pneumothorax will have no symptoms. When symptoms are present, they can include: °· Chest pain. °· Shortness of breath. °· Increased rate of breathing. °· Bluish color to your lips or skin (cyanosis). °DIAGNOSIS  °Pneumothorax is usually diagnosed by a chest X-ray or chest CT scan. Your health care provider will also take a medical history and perform a physical exam to determine why you may have a pneumothorax. °TREATMENT  °A small pneumothorax may go away on its own without treatment. Extra oxygen can sometimes help a small pneumothorax go away more quickly. For a larger pneumothorax or a pneumothorax that is causing symptoms, a procedure is usually needed to drain the air. In some cases, the health care provider may drain the air using a needle. In other cases, a chest tube may be inserted into the pleural space. A chest tube is a small tube placed between the ribs and into the pleural space. This removes the extra air and allows the lung to expand back to its normal size. A large  pneumothorax will usually require a hospital stay. If there is ongoing air leakage into the pleural space, then the chest tube may need to remain in place for several days until the air leak has healed. In some cases, surgery may be needed.  °HOME CARE INSTRUCTIONS  °· Only take over-the-counter or prescription medicines as directed by your health care provider. °· If a cough or pain makes it difficult for you to sleep at night, try sleeping in a semi-upright position in a recliner or by using 2 or 3 pillows. °· Rest and limit activity as directed by your health care provider. °· If you had a chest tube and it was removed, ask your health care provider when it is okay to remove the dressing. Until your health care provider says you can remove the dressing, do not allow it to get wet. °· Do not smoke. Smoking is a risk factor for pneumothorax. °· Do not fly in an airplane or scuba dive until your health care provider says it is okay. °· Follow up with your health care provider as directed. °SEEK IMMEDIATE MEDICAL CARE IF:  °· You have increasing chest pain or shortness of breath. °· You have a cough that is not controlled with suppressants. °· You begin coughing up blood. °· You have pain that is getting worse or is not controlled with medicines. °· You cough up thick, discolored mucus (sputum) that is yellow to green in color. °· You have redness, increasing pain, or discharge at the site where a chest tube had been in place (if   your pneumothorax was treated with a chest tube). °· The site where your chest tube was located opens up. °· You feel air coming out of the site where the chest tube was placed. °· You have a fever or persistent symptoms for more than 2-3 days. °· You have a fever and your symptoms suddenly get worse. °MAKE SURE YOU:  °· Understand these instructions. °· Will watch your condition. °· Will get help right away if you are not doing well or get worse. °Document Released: 03/01/2005 Document  Revised: 12/20/2012 Document Reviewed: 09/28/2012 °ExitCare® Patient Information ©2015 [employer]. This information is not intended to replace advice given to you by your health care provider. Make sure you discuss any questions you have with your health care provider. ° °

## 2014-11-28 NOTE — Care Management Note (Signed)
Case Management Note CM note started by Jacqlyn Krauss RNCM  Patient Details  Name: [name] [name] MRN: [id number] Date of Birth: 20-Feb-1941  Subjective/Objective: Pt admitted for Respiratory Distress. Pt is from home alone. Has support of two sons.                     Action/Plan: CM did speak with [name] with permission of pt. Per son pt has [name] and is active with [facility] for RN, PT services. CM did call Liaison with [place] to verify information. Liaison to call this CM back. CM did ask for PT/ OT evaluation- pt may benefit from higher level of care. Will continue to monitor.   Expected Discharge Date:      11/28/14            Expected Discharge Plan:  [place]  In-House Referral:  Clinical Social Work  Discharge planning Services  CM Consult  Post Acute Care Choice:  Home Health Choice offered to:  Patient  DME Arranged:  N/A DME Agency:  NA  HH Arranged:  RN, PT, OT HH Agency:  [place]  Status of Service:  Completed, signed off  Medicare Important Message Given:  Yes-third notification given Date Medicare IM Given:    Medicare IM give by:    Date Additional Medicare IM Given:    Additional Medicare Important Message give by:     If discussed at [place] of Stay Meetings, dates discussed:  11/28/14  11/28/14- [name] RN, BSN - pt for d/c home today- per [name]- [name] services (RN/PT/OT) have been arranged with [name]- have notified [name] with [name] of pt's discharge today- have also notified [name] with [name] that pt will be discharging home- has refused SNF- they will discharge pt from their services so that [name] can start services. [name] will also continue to follow pt in home- may need to look at ALF as a long term goal with pt due to safety concerns with pt at home alone.   11/27/14- [name] RNBSN- noted in chart plan for pt to return home with CT-to mini express- will need HHRN for this- pt  was active with [name]- however they are not willing to take her back with Chest tube in place- spoke with [name] from [facility] [place] [facility] to discuss concerns that [place] has- and per conversation [name] states that their concerns are from a safety standpoint with pt being at home alone with a chest tube and has little support at home. Has home 02 with [name] and per pt she uses a rollator at home.  Pt is active with [name] also - and when speaking with [name] they share the same concerns regarding the chest tube. Spoke with pt at bedside and pt states that she does NOT want to go back to a STSNF- she wants to return home and does not have a preference on a [place] agency if [name] is not agreeable to continue to follow her. She voices that she has been with them a long time and would like to continue however she wants CM to get someone who is willing to follow her with the Chest Tube. Also reached out to pt's son who also feels pt would be safer in a STSNF while chest tube is in place and would prefer one in [place]- explained that pt would have to be agreeable to this -  which currently she is not-  Have spoken with [name] to see if pt would even be eligible for SNF if agreeable and [name] to f/u. Have reached out to [facility] with [name]- to see if [name] would be willing to take pt as a referral for chest tube management and [name] services- per [name] they are agreeable to receive referral with known safety concerns.  Will need HH RN/PT  [name]- will chest tube management instructions.    Additional Comments: 1214 11-21-14 Jacqlyn Krauss, RN,BSN [phone] CM did speak to [facility] and they can take pt as long she will not have a drain. CM will continue to monitor.   Dawayne Patricia, RN 11/28/2014, 11:34 AM

## 2014-11-29 ENCOUNTER — Other Ambulatory Visit: Payer: Self-pay

## 2014-11-29 DIAGNOSIS — N390 Urinary tract infection, site not specified: Secondary | ICD-10-CM | POA: Diagnosis not present

## 2014-11-29 DIAGNOSIS — J939 Pneumothorax, unspecified: Secondary | ICD-10-CM | POA: Diagnosis not present

## 2014-11-29 DIAGNOSIS — I4891 Unspecified atrial fibrillation: Secondary | ICD-10-CM | POA: Diagnosis not present

## 2014-11-29 DIAGNOSIS — J441 Chronic obstructive pulmonary disease with (acute) exacerbation: Secondary | ICD-10-CM | POA: Diagnosis not present

## 2014-11-29 DIAGNOSIS — I2510 Atherosclerotic heart disease of native coronary artery without angina pectoris: Secondary | ICD-10-CM | POA: Diagnosis not present

## 2014-11-29 DIAGNOSIS — I129 Hypertensive chronic kidney disease with stage 1 through stage 4 chronic kidney disease, or unspecified chronic kidney disease: Secondary | ICD-10-CM | POA: Diagnosis not present

## 2014-11-29 DIAGNOSIS — N189 Chronic kidney disease, unspecified: Secondary | ICD-10-CM | POA: Diagnosis not present

## 2014-11-29 NOTE — Patient Outreach (Signed)
Transition of care call: Patient is extremely hard of hearing and difficulty for her to understand via the phone. Patient reports that she has her medications. Reports that she is not having any pain. Reports that she is living alone. Reports son and daughter in law take her to her appointments. States that she might have to reschedule her appointment with Dr. Nils Pyle due to daughter in laws schedule. Reminded patient of the importance of follow up with Dr. Lisbeth Ply. Patient will call and make an appointment. Reports that she is taking her Cipro as prescribed.   Patient reports that she has not heard from [place]. I placed call to Dahlia Client to confirm patient is supposed to get services. Notified [facility] Client that patient was discharged yesterday.  Patient reports that she is eating and drinking well. Report using her oxygen and her nebs as prescribed.   PLAN: Patient will get weekly transition of care call. Will send this report to MD. Will send update/barrier letter. Reviewed with patient that Landis Martins will call her next week. Patient active with [facility] home [facility].  [name] CM Care Plan Problem Three        Most Recent Value   Care Plan for Problem Three  Active   [name] Long Term Goal (31-90) days  Patient will not have a hospital admisssion in the next 31 days   [name] Long Term Goal Start Date  11/29/14   [facility] Long Term Goal Met Date  -- Barrie Folk not met, date restarted.]   Interventions for Problem Three Long Term Goal  Discussed with patient the importance of timely follow up with primary and specialist MD, s. Encouraged patient to call home health for immediate need or MD on call if needed.    [name] CM Short Term Goal #1 (0-30 days)  Patient will not experience  hospital admission in the next 30 days   [name] CM Short Term Goal #1 Start Date  11/11/14   [facility] CM Short Term Goal #1 Met Date  11/20/14 [goal not met. Readmitted on 9/7]   Interventions for Short Term Goal #1  Notified  PCP of concerns,arrange earlier appointment if warranted.   [name] CM Short Term Goal #2 (0-30 days)  Patient will be verbalized home health nurse visit by 12/03/2014   [facility] CM Short Term Goal #2 Start Date  11/29/14   Interventions for Short Term Goal #2  placed call to [facility] with [facility] home health and confirmed orders.   [name] CM Short Term Goal #3 (0-30 days)  Patient will be able to verbalize call primary MD for follow up appointment in 1 week   [name]  CM Short Term Goal #3 Start Date  11/29/14   Interventions for Short Term Goal #3  Reviewed importance of follow  up with Dr. Lisbeth Ply after completing her Cipro. Encouraged patient to call MD for fever or worsening condition. Enocuraged patient to take all antibotics as prescribed      Tomasa Rand, RN, BSN, [facility] [employer] [phone]

## 2014-12-02 ENCOUNTER — Other Ambulatory Visit: Payer: Self-pay | Admitting: Licensed Clinical Social Worker

## 2014-12-02 DIAGNOSIS — I2510 Atherosclerotic heart disease of native coronary artery without angina pectoris: Secondary | ICD-10-CM | POA: Diagnosis not present

## 2014-12-02 DIAGNOSIS — N189 Chronic kidney disease, unspecified: Secondary | ICD-10-CM | POA: Diagnosis not present

## 2014-12-02 DIAGNOSIS — I129 Hypertensive chronic kidney disease with stage 1 through stage 4 chronic kidney disease, or unspecified chronic kidney disease: Secondary | ICD-10-CM | POA: Diagnosis not present

## 2014-12-02 DIAGNOSIS — N390 Urinary tract infection, site not specified: Secondary | ICD-10-CM | POA: Diagnosis not present

## 2014-12-02 DIAGNOSIS — I4891 Unspecified atrial fibrillation: Secondary | ICD-10-CM | POA: Diagnosis not present

## 2014-12-02 DIAGNOSIS — J441 Chronic obstructive pulmonary disease with (acute) exacerbation: Secondary | ICD-10-CM | POA: Diagnosis not present

## 2014-12-02 NOTE — Patient Outreach (Addendum)
[place] [facility]) Care Management  12/02/2014  SHEMECA LUKASIK [birth year] [id number]   Assessment-[name] received return call from patient's son Ardine Bjork. HIPPA verifications were received. [name] apologized for not returning call earlier and stated that he had a difficult time talking during work hours on days that he works with a Teacher, early years/pre. [name] reported that he is working with a IT sales professional today and was available to talk at this time. [name] informed [name] that [facility] staff and care providers in the hospital had concerns due to patient's frequent and recent hospital admissions and were questioning if assisted living placement would be an option for patient to increase level of care. [name] reported right away that his mother would not be willing to be placed as she continues to want to stay at home and has nurses that provide care there at this time. [name] expressed concerns as she does not have family members to see her frequently during the week and provide care as they are working full time and that patient does not have around the clock care. [name] stated that although going to an ALF may be a good decision for her, he would like to do what his mother wants. [name] spent time explaining the difference between a skilled nursing facility and an assisted living facility and how patient would get the care that she needs as well as still have her independence while living at an ALF. [name] reported that he would like for [name] to continue working on gaining PCS for patient. [name] stated that if patient continues to have problems even with PCS then he would like to revisit the discussion on ALF at that time. [name] informed [name] that patient's missed appointment while at the hospital and will need to be reschedule appointment in order for her PCP to complete the [facility] application. [name] reported that he would call today to reschedule PCP appointment.  Plan-[name] will contact patient's PCP tomorrow to  ensure appointment was scheduled and when. [name] will update RNCM.   Marland KitchenEula Fried, BSW, MSW, [email or web address] Phone: [phone] Fax: [phone]

## 2014-12-02 NOTE — Patient Outreach (Signed)
[facility]) Care Management  12/02/2014  SHONETTE RHAMES 12-Jan-1941 [id number]  Assessment-[name] attempted to contact [facility], relative of patient on 12/02/14. [name] was unable to reach patient's relative and left a HIPPA compliant voice message encouraging her to contact [name] back once she is available.  Plan-[name] will await call back from patient's relative.  [name], BSW, MSW, [email or web address] Phone: [phone] Fax: [phone]

## 2014-12-03 ENCOUNTER — Other Ambulatory Visit: Payer: Self-pay | Admitting: *Deleted

## 2014-12-03 ENCOUNTER — Other Ambulatory Visit: Payer: Self-pay | Admitting: Cardiothoracic Surgery

## 2014-12-03 DIAGNOSIS — I2510 Atherosclerotic heart disease of native coronary artery without angina pectoris: Secondary | ICD-10-CM | POA: Diagnosis not present

## 2014-12-03 DIAGNOSIS — J939 Pneumothorax, unspecified: Secondary | ICD-10-CM

## 2014-12-03 DIAGNOSIS — N189 Chronic kidney disease, unspecified: Secondary | ICD-10-CM | POA: Diagnosis not present

## 2014-12-03 DIAGNOSIS — I129 Hypertensive chronic kidney disease with stage 1 through stage 4 chronic kidney disease, or unspecified chronic kidney disease: Secondary | ICD-10-CM | POA: Diagnosis not present

## 2014-12-03 DIAGNOSIS — J441 Chronic obstructive pulmonary disease with (acute) exacerbation: Secondary | ICD-10-CM | POA: Diagnosis not present

## 2014-12-03 DIAGNOSIS — N390 Urinary tract infection, site not specified: Secondary | ICD-10-CM | POA: Diagnosis not present

## 2014-12-03 DIAGNOSIS — I4891 Unspecified atrial fibrillation: Secondary | ICD-10-CM | POA: Diagnosis not present

## 2014-12-03 NOTE — Patient Outreach (Signed)
[place] [facility] [facility]) Care Management  12/03/2014  CARLO LORSON 1940/06/09 [id number]   Transition of care call  Telephone call to Mrs. Grubb for transition of care, patient answered the phone and requested that I call her back later, that she was working with Physical therapy.   Plan: Will return call to Mrs.Curlin on tomorrow  to set up a home visit for this week   Joylene Draft, [place], [place] Care Management [phone]

## 2014-12-04 ENCOUNTER — Other Ambulatory Visit: Payer: Self-pay | Admitting: *Deleted

## 2014-12-04 ENCOUNTER — Ambulatory Visit (INDEPENDENT_AMBULATORY_CARE_PROVIDER_SITE_OTHER): Payer: Commercial Managed Care - HMO | Admitting: Cardiothoracic Surgery

## 2014-12-04 ENCOUNTER — Encounter: Payer: Self-pay | Admitting: Cardiothoracic Surgery

## 2014-12-04 ENCOUNTER — Ambulatory Visit
Admission: RE | Admit: 2014-12-04 | Discharge: 2014-12-04 | Disposition: A | Discharge status: Home / Self Care | Payer: Commercial Managed Care - HMO | Source: Ambulatory Visit | Attending: Cardiothoracic Surgery | Admitting: Cardiothoracic Surgery

## 2014-12-04 VITALS — BP 115/57 | HR 60 | Resp 20 | Ht 65.0 in | Wt 112.0 lb

## 2014-12-04 DIAGNOSIS — J9383 Other pneumothorax: Secondary | ICD-10-CM | POA: Diagnosis not present

## 2014-12-04 DIAGNOSIS — J939 Pneumothorax, unspecified: Secondary | ICD-10-CM

## 2014-12-04 NOTE — Progress Notes (Signed)
PCP is Leonides Sake, MD Referring Provider is Hamrick, Lorin Mercy, MD  Chief Complaint  Patient presents with  . 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 Hypertension   . Shortness of breath   . Asthma   . COPD (chronic obstructive pulmonary disease)     emphysema    . GERD (gastroesophageal reflux disease)   . Blood dyscrasia     hx blood clotts  . Hyperlipidemia   . Anemia   . Pneumothorax on right 09/2014  . On home oxygen therapy     "3L; 24/7" (11/21/2014)  . Pneumonia     hx pneumonia   . Chronic bronchitis   . Pulmonary embolism   . History of blood transfusion X 2    "related to blood thinner I was taking"  . HUDJSHFW(263.7)     "a few times/year" (11/21/2014)  . Migraine     "weekly probably" (11/21/2014)  . Arthritis     "back" (11/21/2014)  . Chronic lower back pain   . Chronic kidney disease   . Urinary incontinence     Past Surgical History  Procedure Laterality Date  . Hemiarthroplasty shoulder fracture Right   . Cataract extraction w/ intraocular lens  implant, bilateral Bilateral   . Orif wrist fracture  10/13/2011    Procedure: OPEN REDUCTION INTERNAL FIXATION (ORIF) WRIST FRACTURE;  Surgeon: [name], MD;  Location: [facility];  Service: Orthopedics;  Laterality: Right;  Open Reduction Internal Fixation Right Distal Radius   . Hardware removal  01/29/2012    Procedure: HARDWARE REMOVAL;  Surgeon: [name], MD;  Location: [facility];  Service: Orthopedics;  Laterality: Right;  . Hip arthroplasty  01/29/2012    Procedure: ARTHROPLASTY BIPOLAR HIP;  Surgeon: [name], MD;  Location: [facility];  Service: Orthopedics;  Laterality: Right;  . Transurethral resection of bladder tumor N/A 06/11/2014    Procedure: TRANSURETHRAL RESECTION OF BLADDER TUMOR (TURBT);  Surgeon: [name], MD;  Location: [facility];  Service: Urology;  Laterality: N/A;  . Cystoscopy w/ retrogrades Bilateral 06/11/2014    Procedure: CYSTOSCOPY WITH RETROGRADE PYELOGRAM;  Surgeon: [name], MD;  Location: [facility];  Service: Urology;  Laterality: Bilateral;  . Fracture surgery    . Laparoscopic cholecystectomy    . Tubal ligation    . Abdominal hysterectomy      Family History  Problem Relation Age of Onset  . Cancer - Lung Mother   . Coronary artery disease Mother   . Coronary artery disease Sister   . Coronary artery disease Brother     Social History Social History  Substance Use Topics  . Smoking status: Former Smoker -- 1.00 packs/day for 56 years    Types: Cigarettes    Quit date: 02/12/2014  . Smokeless tobacco: Never Used  . Alcohol Use: No    Current Outpatient Prescriptions  Medication Sig Dispense Refill  .  albuterol (PROVENTIL) (2.5 MG/3ML) 0.083% nebulizer solution Take 2.5 mg by nebulization every 6 (six) hours as needed for wheezing or shortness of breath.     Marland Kitchen atorvastatin (LIPITOR) 40 MG tablet Take 40 mg by mouth every morning.     . budesonide-formoterol (SYMBICORT) 160-4.5 MCG/ACT inhaler Inhale 2 puffs into the lungs 2 (two) times daily. 1 Inhaler 12  . ciprofloxacin (CIPRO) 500 MG tablet Take 1 tablet (500 mg total) by mouth 2 (two) times daily. For 7 days 14 tablet 0  . digoxin (LANOXIN) 0.25 MG tablet Take 0.125 mg by mouth every morning.     . diltiazem (CARDIZEM CD) 240 MG 24 hr capsule Take 240 mg by mouth every morning.   1  . docusate sodium (COLACE) 100 MG capsule Take 100 mg by mouth daily as needed  (constipation).     . Ferrous Sulfate (IRON) 325 (65 FE) MG TABS Take 325 mg by mouth daily.    . fluticasone (FLONASE) 50 MCG/ACT nasal spray Place 1 spray into both nostrils daily as needed for allergies or rhinitis.    . furosemide (LASIX) 20 MG tablet Take 1 tablet (20 mg total) by mouth daily. 30 tablet 1  . iron polysaccharides (NIFEREX) 150 MG capsule Take 1 capsule (150 mg total) by mouth daily. 60 capsule 1  . metoprolol succinate (TOPROL-XL) 50 MG 24 hr tablet Take 12.5 mg by mouth daily. Take with or immediately following a meal.    . Omega-3 Fatty Acids (FISH OIL) 1000 MG CAPS Take 1,000 capsules by mouth daily.    Marland Kitchen oxybutynin (DITROPAN-XL) 5 MG 24 hr tablet Take 10 mg by mouth at bedtime.    . pantoprazole (PROTONIX) 40 MG tablet Take 1 tablet (40 mg total) by mouth daily. 30 tablet 0  . rivaroxaban (XARELTO) 20 MG TABS tablet Take 20 mg by mouth daily with supper.    . tiotropium (SPIRIVA) 18 MCG inhalation capsule Place 1 capsule (18 mcg total) into inhaler and inhale daily. 30 capsule 12  . traMADol (ULTRAM) 50 MG tablet Take 1 tablet (50 mg total) by mouth every 6 (six) hours as needed for moderate pain (pain). (Patient taking differently: Take 50 mg by mouth at bedtime as needed for moderate pain (pain). ) 30 tablet 0  . VENTOLIN HFA 108 (90 BASE) MCG/ACT inhaler Inhale 2 puffs into the lungs 4 (four) times daily as needed for wheezing or shortness of breath.     . vitamin B-12 (CYANOCOBALAMIN) 500 MCG tablet Take 1,000 mcg by mouth daily.      No current facility-administered medications for this visit.    Allergies  Allergen Reactions  . Eggs Or Egg-Derived Products Itching    Review of Systems  Patient upset that she still has the catheter in many express  BP 115/57 mmHg  Pulse 60  Resp 20  Ht 5\' 5"  (1.651 m)  Wt 112 lb (50.803 kg)  BMI 18.64 kg/m2  SpO2 97% Physical Exam Breath sounds diminished bilaterally Catheter site clean and dry-dressing change  personally No apparent airleak  Diagnostic Tests: Chest x-ray taken today personally reviewed which shows no pneumothorax  Impression: Severe COPD with recurrent right spontaneous pneumothorax, status post previous VATS on the right. Not a surgical candidate for repeat VATS  Planleave catheter in place another  week. She will return to  office with a chest x-ray and possible removal catheter   Len Childs, MD [facility] [phone]

## 2014-12-04 NOTE — Patient Outreach (Signed)
[facility]) Care Management  12/04/2014  [name] [birth year] [id number]  Transition of care Subjective; Telephone call to [name] [name] reports that she is doing okay, and has an appointment with Dr.Vantrigt today, and she hopes to have her chest tube removed. [name] states that her son has arranged for transportation to the appointment.  Plan Home visit scheduled for Friday September 23  Joylene Draft, [place], [place] Care Management [phone].

## 2014-12-05 DIAGNOSIS — N390 Urinary tract infection, site not specified: Secondary | ICD-10-CM | POA: Diagnosis not present

## 2014-12-05 DIAGNOSIS — I4891 Unspecified atrial fibrillation: Secondary | ICD-10-CM | POA: Diagnosis not present

## 2014-12-05 DIAGNOSIS — N189 Chronic kidney disease, unspecified: Secondary | ICD-10-CM | POA: Diagnosis not present

## 2014-12-05 DIAGNOSIS — I129 Hypertensive chronic kidney disease with stage 1 through stage 4 chronic kidney disease, or unspecified chronic kidney disease: Secondary | ICD-10-CM | POA: Diagnosis not present

## 2014-12-05 DIAGNOSIS — J441 Chronic obstructive pulmonary disease with (acute) exacerbation: Secondary | ICD-10-CM | POA: Diagnosis not present

## 2014-12-05 DIAGNOSIS — I2510 Atherosclerotic heart disease of native coronary artery without angina pectoris: Secondary | ICD-10-CM | POA: Diagnosis not present

## 2014-12-06 ENCOUNTER — Other Ambulatory Visit: Payer: Self-pay | Admitting: *Deleted

## 2014-12-06 NOTE — Patient Outreach (Signed)
[name]) Care Management   12/06/2014  [name] 06-Oct-1940 [id number]  [name] [name] is an 74 y.o. female  Subjective: " I was hoping that I would get this tube out on Wednesday'. [name] [name] reports taking all of her medication as prescribed, states she only has 3 more doses of cipro before its completed. Patient reports pain controlled with as needed tramadol.  Objective:   Review of Systems  Constitutional: Negative.   HENT: Negative.   Respiratory: Negative for cough, hemoptysis, shortness of breath and wheezing.   Cardiovascular: Negative for chest pain.  Genitourinary: Negative for dysuria and flank pain.  Skin: Negative.   Neurological: Negative for dizziness.  Psychiatric/Behavioral: Negative for depression.    Physical Exam  Constitutional: She is oriented to person, place, and time. She appears well-developed.  Cardiovascular: Normal rate and normal heart sounds.   Respiratory: Effort normal.  Slight decreased breath sounds Right mini express chest tube in place, connections intact and dressing dry intact at site  GI: Soft.  Musculoskeletal: She exhibits no edema.  Neurological: She is alert and oriented to person, place, and time.  Skin: Skin is warm and dry.   BP 122/72 mmHg  Pulse 65  Resp 20  SpO2 97%  Current Medications:   Current Outpatient Prescriptions  Medication Sig Dispense Refill  . albuterol (PROVENTIL) (2.5 MG/3ML) 0.083% nebulizer solution Take 2.5 mg by nebulization every 6 (six) hours as needed for wheezing or shortness of breath.     Marland Kitchen atorvastatin (LIPITOR) 40 MG tablet Take 40 mg by mouth every morning.     . budesonide-formoterol (SYMBICORT) 160-4.5 MCG/ACT inhaler Inhale 2 puffs into the lungs 2 (two) times daily. 1 Inhaler 12  . ciprofloxacin (CIPRO) 500 MG tablet Take 1 tablet (500 mg total) by mouth 2 (two) times daily. For 7 days 14 tablet 0  . digoxin (LANOXIN) 0.25 MG tablet Take 0.125 mg by mouth every  morning.     . diltiazem (CARDIZEM CD) 240 MG 24 hr capsule Take 240 mg by mouth every morning.   1  . docusate sodium (COLACE) 100 MG capsule Take 100 mg by mouth daily as needed (constipation).     . Ferrous Sulfate (IRON) 325 (65 FE) MG TABS Take 325 mg by mouth daily.    . fluticasone (FLONASE) 50 MCG/ACT nasal spray Place 1 spray into both nostrils daily as needed for allergies or rhinitis.    . furosemide (LASIX) 20 MG tablet Take 1 tablet (20 mg total) by mouth daily. 30 tablet 1  . iron polysaccharides (NIFEREX) 150 MG capsule Take 1 capsule (150 mg total) by mouth daily. 60 capsule 1  . metoprolol succinate (TOPROL-XL) 50 MG 24 hr tablet Take 12.5 mg by mouth daily. Take with or immediately following a meal. Take 1/4 of a 50 mg tablet    . Omega-3 Fatty Acids (FISH OIL) 1000 MG CAPS Take 1,000 capsules by mouth daily.    Marland Kitchen oxybutynin (DITROPAN-XL) 5 MG 24 hr tablet Take 10 mg by mouth at bedtime.    . pantoprazole (PROTONIX) 40 MG tablet Take 1 tablet (40 mg total) by mouth daily. 30 tablet 0  . rivaroxaban (XARELTO) 20 MG TABS tablet Take 20 mg by mouth daily with supper.    . tiotropium (SPIRIVA) 18 MCG inhalation capsule Place 1 capsule (18 mcg total) into inhaler and inhale daily. 30 capsule 12  . traMADol (ULTRAM) 50 MG tablet Take 1 tablet (50 mg total) by  mouth every 6 (six) hours as needed for moderate pain (pain). (Patient taking differently: Take 50 mg by mouth at bedtime as needed for moderate pain (pain). ) 30 tablet 0  . VENTOLIN HFA 108 (90 BASE) MCG/ACT inhaler Inhale 2 puffs into the lungs 4 (four) times daily as needed for wheezing or shortness of breath.     . vitamin B-12 (CYANOCOBALAMIN) 500 MCG tablet Take 1,000 mcg by mouth daily.      No current facility-administered medications for this visit.    Functional Status:   In your present state of health, do you have any difficulty performing the following activities: 12/06/2014 11/21/2014  Hearing? [name]  Vision? N N   Difficulty concentrating or making decisions? N N  Walking or climbing stairs? N N  Dressing or bathing? N N  Doing errands, shopping? [name]  Preparing Food and eating ? N -  Using the Toilet? N -  In the past six months, have you accidently leaked urine? Y -  Do you have problems with loss of bowel control? N -  Managing your Medications? N -  Managing your Finances? N -  Housekeeping or managing your Housekeeping? Y -    Fall/Depression Screening:    PHQ 2/9 Scores 11/19/2014 10/25/2014 10/07/2014 10/02/2014 09/20/2014 06/20/2014  PHQ - 2 Score 0 0 0 0 0 0    Assessment:   COPD: patient wearing continuous home oxygen at 3 liters - reports her breathing is good on 3 liters compared to 2 liters states that she will report this to [place] at office visit Chest Tube: In place and patient tolerating well, at home visit noted mini express container is hanging on her rollator walker in good position. [name] [name] is aware of her upcoming appointment with Dr.Vantrigt on Wednesday in which she hopes to have chest tube removed patient states that her son [name] is arranging transportation with [employer] service as she did for last visit.  [name] [name] stated that her son [name] was going to make her follow up appointment with Dr.Hamrick.  Plan:  Will continue transition of care calls Will follow up with Ardine Bjork or [place] office to make sure patient has a follow up appointment scheduled. Encouraged [name] [name] to call me,HHRN,or MD for concerns.  [name] CM Care Plan Problem One        Most Recent Value   Care Plan Problem One  Adherence to COPD action plan   Role Documenting the Problem One  Care Management Coordinator   Care Plan for Problem One  Not Active   [name] Long Term Goal (31-90 days)  Patient will be able to follow the COPD action plan and notify MD for symptoms in the yellow Zones in the next 31 days   [name] Long Term Goal Start Date  05/24/14   [name] Long Term Goal Met Date  07/30/14   [name] CM  Short Term Goal #1 (0-30 days)  Patient will be able to state the symptoms  in the yellow zone that she should call the MD about in the next 30 days   [name] CM Short Term Goal #1 Start Date  05/24/14   [facility] CM Short Term Goal #1 Met Date  06/20/14   [name] CM Short Term Goal #2 (0-30 days)  patient will review the COPD material within the next 30 days    [name] CM Short Term Goal #2 Start Date  05/24/14   [facility] CM Short Term Goal #2 Met Date  06/20/14  [name] CM Care Plan Problem Two        Most Recent Value   Care Plan Problem Two  Fall Risk   Role Documenting the Problem Two  Care Management Coordinator   Care Plan for Problem Two  Not Active   [name] Long Term Goal (31-90) days  Patient will not experience a fall in the next 31 days   [name] Long Term Goal Start Date  05/24/14   [name] Long Term Goal Met Date  06/20/14   [name] CM Short Term Goal #1 (0-30 days)  Patient will use safety equipment consistenly within the next 30 days to decrease fall   [name] CM Short Term Goal #1 Start Date  05/24/14   [facility] CM Short Term Goal #1 Met Date   06/20/14   [name] CM Short Term Goal #2 (0-30 days)  Pt will discuss  with MD on next appointment about physical therapy for strengthen   [facility] CM Short Term Goal #2 Start Date  05/24/14   [facility] CM Short Term Goal #2 Met Date  06/20/14    [facility] CM Care Plan Problem Three        Most Recent Value   Care Plan Problem Three  Frequent Hospital Admissions   Role Documenting the Problem Three  Care Management [place] for Problem Three  Active   [name] Long Term Goal (31-90) days  Patient will not have a hospital admisssion in the next 31 days   [place] Term Goal Start Date  11/11/14   [facility] Long Term Goal Met Date  -- [goal not met, date restarted.]   Interventions for Problem Three Long Term Goal  using [employer] transportation service   [name] CM Short Term Goal #1 (0-30 days)  Patient will not experience  hospital admission in the next 30 days   [name] CM Short Term Goal #1 Start Date   11/11/14   [facility] CM Short Term Goal #1 Met Date  -- [goal not met. Readmitted on 9/7]   Interventions for Short Term Goal #1  Notified PCP of concerns,arrange earlier appointment if warranted.   [name] CM Short Term Goal #2 (0-30 days)  Patient will be verbalized home health nurse visit by 12/03/2014   [facility] CM Short Term Goal #2 Start Date  11/29/14   Interventions for Short Term Goal #2  Home health RN and PT visiting twice this week   [name] CM Short Term Goal #3 (0-30 days)  Patient will be able to verbalize call primary MD for follow up appointment in 1 week   [facility]  CM Short Term Goal #3 Start Date  11/29/14   Interventions for Short Term Goal #3  Patients Son [name] to make follow up appointment for monday, September 26 when daughter in law can provide transportation     Joylene Draft, [place], [place] Care Management [phone]

## 2014-12-09 ENCOUNTER — Encounter: Payer: Self-pay | Admitting: Licensed Clinical Social Worker

## 2014-12-09 NOTE — Patient Outreach (Signed)
[facility]) Care Management  [facility] Social Work  12/09/2014  [name] [name] Feb 03, 1941 [id number]   Current Medications:  Current Outpatient Prescriptions  Medication Sig Dispense Refill  . albuterol (PROVENTIL) (2.5 MG/3ML) 0.083% nebulizer solution Take 2.5 mg by nebulization every 6 (six) hours as needed for wheezing or shortness of breath.     Marland Kitchen atorvastatin (LIPITOR) 40 MG tablet Take 40 mg by mouth every morning.     . budesonide-formoterol (SYMBICORT) 160-4.5 MCG/ACT inhaler Inhale 2 puffs into the lungs 2 (two) times daily. 1 Inhaler 12  . ciprofloxacin (CIPRO) 500 MG tablet Take 1 tablet (500 mg total) by mouth 2 (two) times daily. For 7 days 14 tablet 0  . digoxin (LANOXIN) 0.25 MG tablet Take 0.125 mg by mouth every morning.     . diltiazem (CARDIZEM CD) 240 MG 24 hr capsule Take 240 mg by mouth every morning.   1  . docusate sodium (COLACE) 100 MG capsule Take 100 mg by mouth daily as needed (constipation).     . Ferrous Sulfate (IRON) 325 (65 FE) MG TABS Take 325 mg by mouth daily.    . fluticasone (FLONASE) 50 MCG/ACT nasal spray Place 1 spray into both nostrils daily as needed for allergies or rhinitis.    . furosemide (LASIX) 20 MG tablet Take 1 tablet (20 mg total) by mouth daily. 30 tablet 1  . iron polysaccharides (NIFEREX) 150 MG capsule Take 1 capsule (150 mg total) by mouth daily. 60 capsule 1  . metoprolol succinate (TOPROL-XL) 50 MG 24 hr tablet Take 12.5 mg by mouth daily. Take with or immediately following a meal. Take 1/4 of a 50 mg tablet    . Omega-3 Fatty Acids (FISH OIL) 1000 MG CAPS Take 1,000 capsules by mouth daily.    Marland Kitchen oxybutynin (DITROPAN-XL) 5 MG 24 hr tablet Take 10 mg by mouth at bedtime.    . pantoprazole (PROTONIX) 40 MG tablet Take 1 tablet (40 mg total) by mouth daily. 30 tablet 0  . rivaroxaban (XARELTO) 20 MG TABS tablet Take 20 mg by mouth daily with supper.    . tiotropium (SPIRIVA) 18 MCG inhalation capsule Place 1  capsule (18 mcg total) into inhaler and inhale daily. 30 capsule 12  . traMADol (ULTRAM) 50 MG tablet Take 1 tablet (50 mg total) by mouth every 6 (six) hours as needed for moderate pain (pain). (Patient taking differently: Take 50 mg by mouth at bedtime as needed for moderate pain (pain). ) 30 tablet 0  . VENTOLIN HFA 108 (90 BASE) MCG/ACT inhaler Inhale 2 puffs into the lungs 4 (four) times daily as needed for wheezing or shortness of breath.     . vitamin B-12 (CYANOCOBALAMIN) 500 MCG tablet Take 1,000 mcg by mouth daily.      No current facility-administered medications for this visit.    Functional Status:  In your present state of health, do you have any difficulty performing the following activities: 12/06/2014 11/21/2014  Hearing? [name] [name]  Vision? N N  Difficulty concentrating or making decisions? N N  Walking or climbing stairs? N N  Dressing or bathing? N N  Doing errands, shopping? [name] [name]  Preparing Food and eating ? N -  Using the Toilet? N -  In the past six months, have you accidently leaked urine? Y -  Do you have problems with loss of bowel control? N -  Managing your Medications? N -  Managing your Finances? N -  Housekeeping or  managing your Housekeeping? Y -    Fall/Depression Screening:  PHQ 2/9 Scores 11/19/2014 10/25/2014 10/07/2014 10/02/2014 09/20/2014 06/20/2014  PHQ - 2 Score 0 0 0 0 0 0    Assessment: [name] contacted patient's PCP office to question if they received PCS form that was faxed on 12/06/14. [name] was able to successfully reach [name] [name], nurse of PCP. HIPPA verifications received. [name] [name] reports that she has already faxed back completed PCS form. [name] provided fax number and asked for her to fax one more time for good measure and she agreed to do so.  Plan: [name] will await to receive completed PCS form and then fax to [facility] once received to continue the process of gaining medicaid beneficiaries to aid patient and family.  [name] [name], BSW, MSW, [email or web address] Phone: [phone] Fax: [phone]

## 2014-12-10 ENCOUNTER — Other Ambulatory Visit: Payer: Self-pay | Admitting: Licensed Clinical Social Worker

## 2014-12-10 ENCOUNTER — Other Ambulatory Visit: Payer: Self-pay | Admitting: *Deleted

## 2014-12-10 ENCOUNTER — Other Ambulatory Visit: Payer: Self-pay | Admitting: Cardiothoracic Surgery

## 2014-12-10 DIAGNOSIS — I2510 Atherosclerotic heart disease of native coronary artery without angina pectoris: Secondary | ICD-10-CM | POA: Diagnosis not present

## 2014-12-10 DIAGNOSIS — N189 Chronic kidney disease, unspecified: Secondary | ICD-10-CM | POA: Diagnosis not present

## 2014-12-10 DIAGNOSIS — I129 Hypertensive chronic kidney disease with stage 1 through stage 4 chronic kidney disease, or unspecified chronic kidney disease: Secondary | ICD-10-CM | POA: Diagnosis not present

## 2014-12-10 DIAGNOSIS — J441 Chronic obstructive pulmonary disease with (acute) exacerbation: Secondary | ICD-10-CM | POA: Diagnosis not present

## 2014-12-10 DIAGNOSIS — I4891 Unspecified atrial fibrillation: Secondary | ICD-10-CM | POA: Diagnosis not present

## 2014-12-10 DIAGNOSIS — J939 Pneumothorax, unspecified: Secondary | ICD-10-CM

## 2014-12-10 DIAGNOSIS — N390 Urinary tract infection, site not specified: Secondary | ICD-10-CM | POA: Diagnosis not present

## 2014-12-10 NOTE — Patient Outreach (Signed)
[facility]) Care Management  12/10/2014  SAKARI RAISANEN 1941-02-20 [id number]  Assessment-[name] contacted patient by phone on 12/10/14. HIPPA verifications received. Patient reports that she is doing and feeling "a lot better." Patient reports that she has an appointment tomorrow with Tharon Aquas Trigt at noon and she is hopeful that her chest tube will be removed. Patient reports that no feelings of sadness or depression, reports that her mood has improved with "getting better." Cloe states that physical therapy has been beneficial for her. [name] questioned if patient received ALF information packet that [name] gave to [name] to provide to patient and patient agreed. Patient reported no interest in going to ALF even when [name] informed her that she could help assist her with finding placement in [place], [place]. Patient reported that she wishes to stay at home but is aware that she needs additional care. [name] informed patient that she has been in touch with her PCP and is awaiting personal care service form.  [name] reviewed with patient the next steps for personal care services. Patient appreciative of social work assistance.  [name] contacted CMA Lurline Del and questioned if fax from patient's PCP has be successfully received. CMA confirmed that fax has been received and she would submit form into patient's chart.  Plan-[name] will submit PCS application to [facility] by 12/13/14.  [name], BSW, MSW, [email or web address] Phone: [phone] Fax: [phone]

## 2014-12-10 NOTE — Patient Outreach (Signed)
[facility]) Care Management  12/10/2014  [name] [name] Jan 27, 1941 [id number]   Transition of care call Subjective: " I will be glad when tomorrow comes and I get this tube out". [name] [name] reports that she is taking her medication as ordered, has finished up the last dose of her antibiotic. Denies shortness of breath, increased coughing or sputum, chest tube remains. Patient reports that her pain is controlled with the tramadol that she takes. She is [facility] followed by [facility] and physical therapy. [name] [name] reports that her son has arranged transportation with [employer] for her to attend appointment with Dr.Vantrigt on Wednesday, also I spoke with [facility] on the importance of making an appointment with Dr.Hamrick for his mother, he again verbalized that he would, he just forgot. [name] [name] again states that [name] will make the appointment because he has to arrange transportation to the appointment,she and [name] both declined for me to make appointment. [name] [name] denies any further concerns at this time.  Plan: Continue transition of care call program. Reminded patient to call with concerns.    Joylene Draft, RN, [place] Care Management [phone]

## 2014-12-11 ENCOUNTER — Encounter: Payer: Self-pay | Admitting: Cardiothoracic Surgery

## 2014-12-11 ENCOUNTER — Ambulatory Visit (INDEPENDENT_AMBULATORY_CARE_PROVIDER_SITE_OTHER): Payer: Commercial Managed Care - HMO | Admitting: Physician Assistant

## 2014-12-11 ENCOUNTER — Encounter: Payer: Self-pay | Admitting: Licensed Clinical Social Worker

## 2014-12-11 ENCOUNTER — Ambulatory Visit
Admission: RE | Admit: 2014-12-11 | Discharge: 2014-12-11 | Disposition: A | Discharge status: Home / Self Care | Payer: Commercial Managed Care - HMO | Source: Ambulatory Visit | Attending: Cardiothoracic Surgery | Admitting: Cardiothoracic Surgery

## 2014-12-11 VITALS — BP 150/69 | HR 67 | Resp 20 | Ht 65.0 in | Wt 112.0 lb

## 2014-12-11 DIAGNOSIS — R05 Cough: Secondary | ICD-10-CM | POA: Diagnosis not present

## 2014-12-11 DIAGNOSIS — J9383 Other pneumothorax: Secondary | ICD-10-CM | POA: Diagnosis not present

## 2014-12-11 DIAGNOSIS — J939 Pneumothorax, unspecified: Secondary | ICD-10-CM

## 2014-12-11 NOTE — Progress Notes (Signed)
HPI: This is a 74 year old Caucasian female with a history or recurrent right spontaneous pneumothoraces, bullous emphysema, oxygen dependent who presents today for possible right catheter (12 [place]) removal. The 22 French catheter was placed by IR on 09/08. It was placed to a mini express. She was last seen in the office on 09/21 by [name]. She is not a surgical candidate. Her only complaint at this time is that it has taken a long time for her lung to heal and she wants cathter out.  Current Outpatient Prescriptions  Medication Sig Dispense Refill  . albuterol (PROVENTIL) (2.5 MG/3ML) 0.083% nebulizer solution Take 2.5 mg by nebulization every 6 (six) hours as needed for wheezing or shortness of breath.     Marland Kitchen atorvastatin (LIPITOR) 40 MG tablet Take 40 mg by mouth every morning.     . budesonide-formoterol (SYMBICORT) 160-4.5 MCG/ACT inhaler Inhale 2 puffs into the lungs 2 (two) times daily. 1 Inhaler 12  . ciprofloxacin (CIPRO) 500 MG tablet Take 1 tablet (500 mg total) by mouth 2 (two) times daily. For 7 days 14 tablet 0  . digoxin (LANOXIN) 0.25 MG tablet Take 0.125 mg by mouth every morning.     . diltiazem (CARDIZEM CD) 240 MG 24 hr capsule Take 240 mg by mouth every morning.   1  . docusate sodium (COLACE) 100 MG capsule Take 100 mg by mouth daily as needed (constipation).     . Ferrous Sulfate (IRON) 325 (65 FE) MG TABS Take 325 mg by mouth daily.    . fluticasone (FLONASE) 50 MCG/ACT nasal spray Place 1 spray into both nostrils daily as needed for allergies or rhinitis.    . furosemide (LASIX) 20 MG tablet Take 1 tablet (20 mg total) by mouth daily. 30 tablet 1  . iron polysaccharides (NIFEREX) 150 MG capsule Take 1 capsule (150 mg total) by mouth daily. 60 capsule 1  . metoprolol succinate (TOPROL-XL) 50 MG 24 hr tablet Take 12.5 mg by mouth daily. Take with or immediately following a meal. Take 1/4 of a 50 mg tablet    . Omega-3 Fatty Acids (FISH OIL) 1000 MG CAPS Take 1,000  capsules by mouth daily.    Marland Kitchen oxybutynin (DITROPAN-XL) 5 MG 24 hr tablet Take 10 mg by mouth at bedtime.    . pantoprazole (PROTONIX) 40 MG tablet Take 1 tablet (40 mg total) by mouth daily. 30 tablet 0  . rivaroxaban (XARELTO) 20 MG TABS tablet Take 20 mg by mouth daily with supper.    . tiotropium (SPIRIVA) 18 MCG inhalation capsule Place 1 capsule (18 mcg total) into inhaler and inhale daily. 30 capsule 12  . traMADol (ULTRAM) 50 MG tablet Take 1 tablet (50 mg total) by mouth every 6 (six) hours as needed for moderate pain (pain). (Patient taking differently: Take 50 mg by mouth at bedtime as needed for moderate pain (pain). ) 30 tablet 0  . VENTOLIN HFA 108 (90 BASE) MCG/ACT inhaler Inhale 2 puffs into the lungs 4 (four) times daily as needed for wheezing or shortness of breath.     . vitamin B-12 (CYANOCOBALAMIN) 500 MCG tablet Take 1,000 mcg by mouth daily.      No current facility-administered medications for this visit.  Vital Signs: BP 150/69,HR 67, RR 20, Oxygen saturation 93% on room air  Physical Exam: CV RRR, systolic murmur Pulmonary Diminished breath sounds bilaterally  Diagnostic Tests: PA/LAT CXR: COMPARISON: PA and lateral chest 11/28/2014 and 12/04/2014.  FINDINGS: Pigtail catheter  remains in place in the right chest. No pneumothorax is identified. Biapical scarring is unchanged. No consolidative process or pleural effusion is seen. Heart size is normal. Convex left thoracic scoliosis noted.  IMPRESSION: No acute disease.  No pneumothorax is identified with a right chest tube in place.  Impression and Plan:Her chest xray remains stable;however, there is a leak in her mini express. As a result, the 66 French catheter will remain. She will return in one week with a chest x ray to see [name]. Hopefully, at that time, the catheter will be able to be removed.    Nani Skillern, PA-C [facility] [phone]

## 2014-12-11 NOTE — Patient Outreach (Signed)
[facility]) Care Management  [facility] Social Work  12/11/2014  TIERSA DAYLEY [birth year] [id number]   Current Medications:  Current Outpatient Prescriptions  Medication Sig Dispense Refill  . albuterol (PROVENTIL) (2.5 MG/3ML) 0.083% nebulizer solution Take 2.5 mg by nebulization every 6 (six) hours as needed for wheezing or shortness of breath.     Marland Kitchen atorvastatin (LIPITOR) 40 MG tablet Take 40 mg by mouth every morning.     . budesonide-formoterol (SYMBICORT) 160-4.5 MCG/ACT inhaler Inhale 2 puffs into the lungs 2 (two) times daily. 1 Inhaler 12  . ciprofloxacin (CIPRO) 500 MG tablet Take 1 tablet (500 mg total) by mouth 2 (two) times daily. For 7 days 14 tablet 0  . digoxin (LANOXIN) 0.25 MG tablet Take 0.125 mg by mouth every morning.     . diltiazem (CARDIZEM CD) 240 MG 24 hr capsule Take 240 mg by mouth every morning.   1  . docusate sodium (COLACE) 100 MG capsule Take 100 mg by mouth daily as needed (constipation).     . Ferrous Sulfate (IRON) 325 (65 FE) MG TABS Take 325 mg by mouth daily.    . fluticasone (FLONASE) 50 MCG/ACT nasal spray Place 1 spray into both nostrils daily as needed for allergies or rhinitis.    . furosemide (LASIX) 20 MG tablet Take 1 tablet (20 mg total) by mouth daily. 30 tablet 1  . iron polysaccharides (NIFEREX) 150 MG capsule Take 1 capsule (150 mg total) by mouth daily. 60 capsule 1  . metoprolol succinate (TOPROL-XL) 50 MG 24 hr tablet Take 12.5 mg by mouth daily. Take with or immediately following a meal. Take 1/4 of a 50 mg tablet    . Omega-3 Fatty Acids (FISH OIL) 1000 MG CAPS Take 1,000 capsules by mouth daily.    Marland Kitchen oxybutynin (DITROPAN-XL) 5 MG 24 hr tablet Take 10 mg by mouth at bedtime.    . pantoprazole (PROTONIX) 40 MG tablet Take 1 tablet (40 mg total) by mouth daily. 30 tablet 0  . rivaroxaban (XARELTO) 20 MG TABS tablet Take 20 mg by mouth daily with supper.    . tiotropium (SPIRIVA) 18 MCG inhalation capsule Place 1  capsule (18 mcg total) into inhaler and inhale daily. 30 capsule 12  . traMADol (ULTRAM) 50 MG tablet Take 1 tablet (50 mg total) by mouth every 6 (six) hours as needed for moderate pain (pain). (Patient taking differently: Take 50 mg by mouth at bedtime as needed for moderate pain (pain). ) 30 tablet 0  . VENTOLIN HFA 108 (90 BASE) MCG/ACT inhaler Inhale 2 puffs into the lungs 4 (four) times daily as needed for wheezing or shortness of breath.     . vitamin B-12 (CYANOCOBALAMIN) 500 MCG tablet Take 1,000 mcg by mouth daily.      No current facility-administered medications for this visit.    Functional Status:  In your present state of health, do you have any difficulty performing the following activities: 12/06/2014 11/21/2014  Hearing? [name]  Vision? N N  Difficulty concentrating or making decisions? N N  Walking or climbing stairs? N N  Dressing or bathing? N N  Doing errands, shopping? [name]  Preparing Food and eating ? N -  Using the Toilet? N -  In the past six months, have you accidently leaked urine? Y -  Do you have problems with loss of bowel control? N -  Managing your Medications? N -  Managing your Finances? N -  Housekeeping or  managing your Housekeeping? Y -    Fall/Depression Screening:  PHQ 2/9 Scores 11/19/2014 10/25/2014 10/07/2014 10/02/2014 09/20/2014 06/20/2014  PHQ - 2 Score 0 0 0 0 0 0    Assessment: [name] successfully faxed PCS completed application to [facility] on 12/11/14.  Plan: [name] will contact [facility] within 1 week to check status of application per recommendation of [facility] staff.  [name], BSW, MSW, [email or web address] Phone: [phone] Fax: [phone]

## 2014-12-12 DIAGNOSIS — N390 Urinary tract infection, site not specified: Secondary | ICD-10-CM | POA: Diagnosis not present

## 2014-12-12 DIAGNOSIS — I4891 Unspecified atrial fibrillation: Secondary | ICD-10-CM | POA: Diagnosis not present

## 2014-12-12 DIAGNOSIS — I129 Hypertensive chronic kidney disease with stage 1 through stage 4 chronic kidney disease, or unspecified chronic kidney disease: Secondary | ICD-10-CM | POA: Diagnosis not present

## 2014-12-12 DIAGNOSIS — J441 Chronic obstructive pulmonary disease with (acute) exacerbation: Secondary | ICD-10-CM | POA: Diagnosis not present

## 2014-12-12 DIAGNOSIS — I2510 Atherosclerotic heart disease of native coronary artery without angina pectoris: Secondary | ICD-10-CM | POA: Diagnosis not present

## 2014-12-12 DIAGNOSIS — N189 Chronic kidney disease, unspecified: Secondary | ICD-10-CM | POA: Diagnosis not present

## 2014-12-13 DIAGNOSIS — J441 Chronic obstructive pulmonary disease with (acute) exacerbation: Secondary | ICD-10-CM | POA: Diagnosis not present

## 2014-12-13 DIAGNOSIS — I2510 Atherosclerotic heart disease of native coronary artery without angina pectoris: Secondary | ICD-10-CM | POA: Diagnosis not present

## 2014-12-13 DIAGNOSIS — N390 Urinary tract infection, site not specified: Secondary | ICD-10-CM | POA: Diagnosis not present

## 2014-12-13 DIAGNOSIS — N189 Chronic kidney disease, unspecified: Secondary | ICD-10-CM | POA: Diagnosis not present

## 2014-12-13 DIAGNOSIS — I4891 Unspecified atrial fibrillation: Secondary | ICD-10-CM | POA: Diagnosis not present

## 2014-12-13 DIAGNOSIS — I129 Hypertensive chronic kidney disease with stage 1 through stage 4 chronic kidney disease, or unspecified chronic kidney disease: Secondary | ICD-10-CM | POA: Diagnosis not present

## 2014-12-15 DIAGNOSIS — J449 Chronic obstructive pulmonary disease, unspecified: Secondary | ICD-10-CM | POA: Diagnosis not present

## 2014-12-17 ENCOUNTER — Other Ambulatory Visit: Payer: Self-pay | Admitting: *Deleted

## 2014-12-17 ENCOUNTER — Other Ambulatory Visit: Payer: Self-pay | Admitting: Cardiothoracic Surgery

## 2014-12-17 DIAGNOSIS — I2510 Atherosclerotic heart disease of native coronary artery without angina pectoris: Secondary | ICD-10-CM | POA: Diagnosis not present

## 2014-12-17 DIAGNOSIS — I129 Hypertensive chronic kidney disease with stage 1 through stage 4 chronic kidney disease, or unspecified chronic kidney disease: Secondary | ICD-10-CM | POA: Diagnosis not present

## 2014-12-17 DIAGNOSIS — J939 Pneumothorax, unspecified: Secondary | ICD-10-CM

## 2014-12-17 DIAGNOSIS — N390 Urinary tract infection, site not specified: Secondary | ICD-10-CM | POA: Diagnosis not present

## 2014-12-17 DIAGNOSIS — Z6821 Body mass index (BMI) 21.0-21.9, adult: Secondary | ICD-10-CM | POA: Diagnosis not present

## 2014-12-17 DIAGNOSIS — N189 Chronic kidney disease, unspecified: Secondary | ICD-10-CM | POA: Diagnosis not present

## 2014-12-17 DIAGNOSIS — I4891 Unspecified atrial fibrillation: Secondary | ICD-10-CM | POA: Diagnosis not present

## 2014-12-17 DIAGNOSIS — Z23 Encounter for immunization: Secondary | ICD-10-CM | POA: Diagnosis not present

## 2014-12-17 DIAGNOSIS — J441 Chronic obstructive pulmonary disease with (acute) exacerbation: Secondary | ICD-10-CM | POA: Diagnosis not present

## 2014-12-17 DIAGNOSIS — J9383 Other pneumothorax: Secondary | ICD-10-CM | POA: Diagnosis not present

## 2014-12-18 ENCOUNTER — Ambulatory Visit (INDEPENDENT_AMBULATORY_CARE_PROVIDER_SITE_OTHER): Payer: Commercial Managed Care - HMO | Admitting: Cardiothoracic Surgery

## 2014-12-18 ENCOUNTER — Encounter: Payer: Self-pay | Admitting: Cardiothoracic Surgery

## 2014-12-18 ENCOUNTER — Ambulatory Visit
Admission: RE | Admit: 2014-12-18 | Discharge: 2014-12-18 | Disposition: A | Discharge status: Home / Self Care | Payer: Commercial Managed Care - HMO | Source: Ambulatory Visit | Attending: Cardiothoracic Surgery | Admitting: Cardiothoracic Surgery

## 2014-12-18 VITALS — BP 167/69 | HR 66 | Resp 16 | Ht 65.0 in | Wt 112.0 lb

## 2014-12-18 DIAGNOSIS — IMO0002 Reserved for concepts with insufficient information to code with codable children: Secondary | ICD-10-CM

## 2014-12-18 DIAGNOSIS — J9383 Other pneumothorax: Secondary | ICD-10-CM | POA: Diagnosis not present

## 2014-12-18 DIAGNOSIS — J9382 Other air leak: Secondary | ICD-10-CM

## 2014-12-18 DIAGNOSIS — R079 Chest pain, unspecified: Secondary | ICD-10-CM | POA: Diagnosis not present

## 2014-12-18 DIAGNOSIS — J939 Pneumothorax, unspecified: Secondary | ICD-10-CM

## 2014-12-18 NOTE — Progress Notes (Signed)
PCP is Leonides Sake, MD Referring Provider is Hamrick, Lorin Mercy, MD  Chief Complaint  Patient presents with  . Routine Post Op    1 week f/u with CXR, re-eval removel of chest tube catheter    HPI: Patient returns with pigtail catheter in right pleural space for recurrent spontaneous pneumothorax She denies any respiratory problems. She has no airleak on exam today her chest x-rays clear without pneumothorax  The pigtail catheter was removed from her right chest and a tight dressing was applied which will remain for 72 hours. Breath sounds were equal after removal 2.  Past Medical History  Diagnosis Date  . Hypertension   . Shortness of breath   . Asthma   . COPD (chronic obstructive pulmonary disease) (HCC)     emphysema    . GERD (gastroesophageal reflux disease)   . Blood dyscrasia     hx blood clotts  . Hyperlipidemia   . Anemia   . Pneumothorax on right 09/2014  . On home oxygen therapy     "3L; 24/7" (11/21/2014)  . Pneumonia     hx pneumonia   . Chronic bronchitis ([place])   . Pulmonary embolism ([place])   . History of blood transfusion X 2    "related to blood thinner I was taking"  . MHDQQIWL(798.9)     "a few times/year" (11/21/2014)  . Migraine     "weekly probably" (11/21/2014)  . Arthritis     "back" (11/21/2014)  . Chronic lower back pain   . Chronic kidney disease   . Urinary incontinence     Past Surgical History  Procedure Laterality Date  . Hemiarthroplasty shoulder fracture Right   . Cataract extraction w/ intraocular lens  implant, bilateral Bilateral   . Orif wrist fracture  10/13/2011    Procedure: OPEN REDUCTION INTERNAL FIXATION (ORIF) WRIST FRACTURE;  Surgeon: [name], MD;  Location: [place];  Service: Orthopedics;  Laterality: Right;  Open Reduction Internal Fixation Right Distal Radius   . Hardware removal  01/29/2012    Procedure: HARDWARE REMOVAL;  Surgeon: [name], MD;  Location: [place];  Service: Orthopedics;  Laterality: Right;  . Hip  arthroplasty  01/29/2012    Procedure: ARTHROPLASTY BIPOLAR HIP;  Surgeon: [name], MD;  Location: [place];  Service: Orthopedics;  Laterality: Right;  . Transurethral resection of bladder tumor N/A 06/11/2014    Procedure: TRANSURETHRAL RESECTION OF BLADDER TUMOR (TURBT);  Surgeon: [name], MD;  Location: [facility];  Service: Urology;  Laterality: N/A;  . Cystoscopy w/ retrogrades Bilateral 06/11/2014    Procedure: CYSTOSCOPY WITH RETROGRADE PYELOGRAM;  Surgeon: [name], MD;  Location: [facility];  Service: Urology;  Laterality: Bilateral;  . Fracture surgery    . Laparoscopic cholecystectomy    . Tubal ligation    . Abdominal hysterectomy      Family History  Problem Relation Age of Onset  . Cancer - Lung Mother   . Coronary artery disease Mother   . Coronary artery disease Sister   . Coronary artery disease Brother     Social History Social History  Substance Use Topics  . Smoking status: Former Smoker -- 1.00 packs/day for 56 years    Types: Cigarettes    Quit date: 02/12/2014  . Smokeless tobacco: Never Used  . Alcohol Use: No    Current Outpatient Prescriptions  Medication Sig Dispense Refill  . albuterol (PROVENTIL) (2.5 MG/3ML) 0.083% nebulizer solution Take 2.5 mg by nebulization every 6 (  six) hours as needed for wheezing or shortness of breath.     Marland Kitchen atorvastatin (LIPITOR) 40 MG tablet Take 40 mg by mouth every morning.     . budesonide-formoterol (SYMBICORT) 160-4.5 MCG/ACT inhaler Inhale 2 puffs into the lungs 2 (two) times daily. 1 Inhaler 12  . ciprofloxacin (CIPRO) 500 MG tablet Take 1 tablet (500 mg total) by mouth 2 (two) times daily. For 7 days 14 tablet 0  . digoxin (LANOXIN) 0.25 MG tablet Take 0.125 mg by mouth every morning.     . diltiazem (CARDIZEM CD) 240 MG 24 hr capsule Take 240 mg by mouth every morning.   1  . docusate sodium (COLACE) 100 MG capsule Take 100 mg by mouth daily as needed (constipation).     . Ferrous Sulfate (IRON) 325 (65  FE) MG TABS Take 325 mg by mouth daily.    . fluticasone (FLONASE) 50 MCG/ACT nasal spray Place 1 spray into both nostrils daily as needed for allergies or rhinitis.    . furosemide (LASIX) 20 MG tablet Take 1 tablet (20 mg total) by mouth daily. 30 tablet 1  . iron polysaccharides (NIFEREX) 150 MG capsule Take 1 capsule (150 mg total) by mouth daily. 60 capsule 1  . metoprolol succinate (TOPROL-XL) 50 MG 24 hr tablet Take 12.5 mg by mouth daily. Take with or immediately following a meal. Take 1/4 of a 50 mg tablet    . Omega-3 Fatty Acids (FISH OIL) 1000 MG CAPS Take 1,000 capsules by mouth daily.    Marland Kitchen oxybutynin (DITROPAN-XL) 5 MG 24 hr tablet Take 10 mg by mouth at bedtime.    . pantoprazole (PROTONIX) 40 MG tablet Take 1 tablet (40 mg total) by mouth daily. 30 tablet 0  . rivaroxaban (XARELTO) 20 MG TABS tablet Take 20 mg by mouth daily with supper.    . tiotropium (SPIRIVA) 18 MCG inhalation capsule Place 1 capsule (18 mcg total) into inhaler and inhale daily. 30 capsule 12  . traMADol (ULTRAM) 50 MG tablet Take 1 tablet (50 mg total) by mouth every 6 (six) hours as needed for moderate pain (pain). (Patient taking differently: Take 50 mg by mouth at bedtime as needed for moderate pain (pain). ) 30 tablet 0  . VENTOLIN HFA 108 (90 BASE) MCG/ACT inhaler Inhale 2 puffs into the lungs 4 (four) times daily as needed for wheezing or shortness of breath.     . vitamin B-12 (CYANOCOBALAMIN) 500 MCG tablet Take 1,000 mcg by mouth daily.      No current facility-administered medications for this visit.    Allergies  Allergen Reactions  . Eggs Or Egg-Derived Products Itching    Review of Systems  She is a nonsmoker She denies chest pain or shortness of breath  BP 167/69 mmHg  Pulse 66  Resp 16  Ht 5\' 5"  (1.651 m)  Wt 112 lb (50.803 kg)  BMI 18.64 kg/m2  SpO2 92% Physical Exam Alert and comfortable Hard of hearing Breath sounds equal but distant Catheter site clean and dry Catheter  removed without difficulty and sterile dressing applied  Diagnostic Tests: Chest x-ray shows COPD without pneumothorax  Impression: Resolved spontaneous pneumothorax Chest tube removed  Plan:return for followup in 4 weeks with chest x-ray   Len Childs, MD [facility] [phone]

## 2014-12-19 ENCOUNTER — Other Ambulatory Visit: Payer: Self-pay | Admitting: *Deleted

## 2014-12-19 DIAGNOSIS — N189 Chronic kidney disease, unspecified: Secondary | ICD-10-CM | POA: Diagnosis not present

## 2014-12-19 DIAGNOSIS — I4891 Unspecified atrial fibrillation: Secondary | ICD-10-CM | POA: Diagnosis not present

## 2014-12-19 DIAGNOSIS — N390 Urinary tract infection, site not specified: Secondary | ICD-10-CM | POA: Diagnosis not present

## 2014-12-19 DIAGNOSIS — I129 Hypertensive chronic kidney disease with stage 1 through stage 4 chronic kidney disease, or unspecified chronic kidney disease: Secondary | ICD-10-CM | POA: Diagnosis not present

## 2014-12-19 DIAGNOSIS — I2510 Atherosclerotic heart disease of native coronary artery without angina pectoris: Secondary | ICD-10-CM | POA: Diagnosis not present

## 2014-12-19 DIAGNOSIS — J441 Chronic obstructive pulmonary disease with (acute) exacerbation: Secondary | ICD-10-CM | POA: Diagnosis not present

## 2014-12-19 NOTE — Patient Outreach (Addendum)
[facility]) Care Management   12/19/2014  [name] [name] 1940/09/21 [id number]  [name] [name] is an 74 y.o. female  Subjective: " I'm so glad to have that tube out"  Objective:   Review of Systems  Constitutional: Negative.   HENT: Negative.   Eyes: Negative for blurred vision.  Cardiovascular: Positive for leg swelling. Negative for chest pain.       Bilateral lower extremity edema 1+  Gastrointestinal: Negative.   Genitourinary: Negative for dysuria.  Musculoskeletal: Negative for falls.  Skin: Negative.   Neurological: Negative for dizziness.  Endo/Heme/Allergies: Bruises/bleeds easily.  Psychiatric/Behavioral: Negative for depression. The patient is not nervous/anxious.     Physical Exam  Constitutional: She is oriented to person, place, and time. She appears well-developed.  Respiratory: Breath sounds normal.  Bilaterally equal breath sounds  Neurological: She is oriented to person, place, and time.  Skin: Skin is warm and dry.  Dressing at right chest area, clean dry and intact  Psychiatric: She has a normal mood and affect. Her behavior is normal. Judgment and thought content normal.   BP 150/78 mmHg  Pulse 60  Resp 20  SpO2 97% Current Medications:   Current Outpatient Prescriptions  Medication Sig Dispense Refill  . albuterol (PROVENTIL) (2.5 MG/3ML) 0.083% nebulizer solution Take 2.5 mg by nebulization every 6 (six) hours as needed for wheezing or shortness of breath.     Marland Kitchen atorvastatin (LIPITOR) 40 MG tablet Take 40 mg by mouth every morning.     . budesonide-formoterol (SYMBICORT) 160-4.5 MCG/ACT inhaler Inhale 2 puffs into the lungs 2 (two) times daily. 1 Inhaler 12  . digoxin (LANOXIN) 0.25 MG tablet Take 0.125 mg by mouth every morning.     . diltiazem (CARDIZEM CD) 240 MG 24 hr capsule Take 240 mg by mouth every morning.   1  . docusate sodium (COLACE) 100 MG capsule Take 100 mg by mouth daily as needed (constipation).     . Ferrous  Sulfate (IRON) 325 (65 FE) MG TABS Take 325 mg by mouth daily.    . fluticasone (FLONASE) 50 MCG/ACT nasal spray Place 1 spray into both nostrils daily as needed for allergies or rhinitis.    . furosemide (LASIX) 20 MG tablet Take 1 tablet (20 mg total) by mouth daily. 30 tablet 1  . metoprolol succinate (TOPROL-XL) 50 MG 24 hr tablet Take 12.5 mg by mouth daily. Take with or immediately following a meal. Take 1/4 of a 50 mg tablet    . oxybutynin (DITROPAN-XL) 5 MG 24 hr tablet Take 10 mg by mouth at bedtime.    . pantoprazole (PROTONIX) 40 MG tablet Take 1 tablet (40 mg total) by mouth daily. 30 tablet 0  . rivaroxaban (XARELTO) 20 MG TABS tablet Take 20 mg by mouth daily with supper.    . traMADol (ULTRAM) 50 MG tablet Take 1 tablet (50 mg total) by mouth every 6 (six) hours as needed for moderate pain (pain). (Patient taking differently: Take 50 mg by mouth at bedtime as needed for moderate pain (pain). ) 30 tablet 0  . VENTOLIN HFA 108 (90 BASE) MCG/ACT inhaler Inhale 2 puffs into the lungs 4 (four) times daily as needed for wheezing or shortness of breath.     . vitamin B-12 (CYANOCOBALAMIN) 500 MCG tablet Take 1,000 mcg by mouth daily.     . ciprofloxacin (CIPRO) 500 MG tablet Take 1 tablet (500 mg total) by mouth 2 (two) times daily. For 7 days (Patient  not taking: Reported on 12/19/2014) 14 tablet 0  . iron polysaccharides (NIFEREX) 150 MG capsule Take 1 capsule (150 mg total) by mouth daily. (Patient not taking: Reported on 12/19/2014) 60 capsule 1  . Omega-3 Fatty Acids (FISH OIL) 1000 MG CAPS Take 1,000 capsules by mouth daily.    Marland Kitchen tiotropium (SPIRIVA) 18 MCG inhalation capsule Place 1 capsule (18 mcg total) into inhaler and inhale daily. (Patient not taking: Reported on 12/19/2014) 30 capsule 12   No current facility-administered medications for this visit.    Functional Status:   In your present state of health, do you have any difficulty performing the following activities: 12/06/2014  11/21/2014  Hearing? [name]  Vision? N N  Difficulty concentrating or making decisions? N N  Walking or climbing stairs? N N  Dressing or bathing? N N  Doing errands, shopping? [name]  Preparing Food and eating ? N -  Using the Toilet? N -  In the past six months, have you accidently leaked urine? Y -  Do you have problems with loss of bowel control? N -  Managing your Medications? N -  Managing your Finances? N -  Housekeeping or managing your Housekeeping? Y -    Fall/Depression Screening:    PHQ 2/9 Scores 11/19/2014 10/25/2014 10/07/2014 10/02/2014 09/20/2014 06/20/2014  PHQ - 2 Score 0 0 0 0 0 0    Assessment:   Recent Pneumothorax: Right chest tube was removed on yesterday at office visit with Dr.Vantrigt. [name] [name] reports that her breathing is good, she continues to wear her oxygen at 3 liters nasal cannula.Will benefit from closer monitoring so I will see her biweekly. Patient denies shortness of breath, reports breathing good. Home Health Care: [name] [name] [facility] with [facility] health RN and Physical therapy, patient reports that since her tube is out that she prefers to return to use [facility] home health services and that she asked Dr.Hamrick about signing paperwork for referral back to them.  [name] [name] reports taking her daily medication  except for the spriva and she notified Dr.Hamrick of this on office visit on 10/4, also she reports that she has not been taking her albuterol nebulizer 4 times a day only 2 , and that she was going to start taking it 4 times a day.. I called patients pharmacy for refill on needed medication per patients request.   Plan:  Will follow up with [place] office regarding patient's request for referral to care [place], I spoke with Caryl Pina at [place] office, she has my contact number and will return call,and will update [name] [name]. I spoke with [place] they have not received referral yet I notified Cindy RN at  [place] that follows patient at home, and she plans to see [name] [name] on 9/7, reviewed with her MD request to leave dressing in place until Saturday,10/9 Will review with patient  progression goals and update. I will plan  a home visit in 2 weeks.  [name] CM Care Plan Problem One        Most Recent Value   Care Plan Problem One  Adherence to COPD action plan   Role Documenting the Problem One  Care Management Coordinator   Care Plan for Problem One  Not Active   [name] Long Term Goal (31-90 days)  Patient will be able to follow the COPD action plan and notify MD for symptoms in the yellow Zones in the next 31 days   [name] Long  Term Goal Start Date  05/24/14   [name] Long Term Goal Met Date  07/30/14   [name] CM Short Term Goal #1 (0-30 days)  Patient will be able to state the symptoms  in the yellow zone that she should call the MD about in the next 30 days   [name] CM Short Term Goal #1 Start Date  05/24/14   [facility] CM Short Term Goal #1 Met Date  06/20/14   [name] CM Short Term Goal #2 (0-30 days)  patient will review the COPD material within the next 30 days    [name] CM Short Term Goal #2 Start Date  05/24/14   [facility] CM Short Term Goal #2 Met Date  06/20/14    [facility] CM Care Plan Problem Two        Most Recent Value   Care Plan Problem Two  Fall Risk   Role Documenting the Problem Two  Care Management Coordinator   Care Plan for Problem Two  Not Active   [name] Long Term Goal (31-90) days  Patient will not experience a fall in the next 31 days   [name] Long Term Goal Start Date  05/24/14   [name] Long Term Goal Met Date  06/20/14   [name] CM Short Term Goal #1 (0-30 days)  Patient will use safety equipment consistenly within the next 30 days to decrease fall   [name] CM Short Term Goal #1 Start Date  05/24/14   [facility] CM Short Term Goal #1 Met Date   06/20/14   [name] CM Short Term Goal #2 (0-30 days)  Pt will discuss  with MD on next appointment about physical therapy for strengthen   [facility] CM Short Term Goal #2 Start Date   05/24/14   [facility] CM Short Term Goal #2 Met Date  06/20/14    [facility] CM Care Plan Problem Three        Most Recent Value   Care Plan Problem Three  Frequent Hospital Admissions   Role Documenting the Problem Three  Care Management Coordinator   Care Plan for Problem Three  Active   [name] Long Term Goal (31-90) days  Patient will not have a hospital admisssion in the next 31 days   [name] Long Term Goal Start Date  11/29/14   [facility] Long Term Goal Met Date  -- [goal not met, date restarted.]   Interventions for Problem Three Long Term Goal  Reviewed with patient,to notify MD of sudden chest pain,sob.Reviewed causes and signs and symptoms of recurrent pneumothorax     [name] CM Short Term Goal #1 (0-30 days)  Patient will not experience  hospital admission in the next 30 days   [name] CM Short Term Goal #1 Start Date  11/11/14   [facility] CM Short Term Goal #1 Met Date  -- [goal not met. Readmitted on 9/7]   Interventions for Short Term Goal #1  Reviewed importance of following MD recommendations of keeping dressing in place for  72 hours   [name] CM Short Term Goal #2 (0-30 days)  Patient will be verbalized home health nurse visit by 12/03/2014   [facility] CM Short Term Goal #2 Start Date  11/29/14   [name] CM Short Term Goal #2 Met Date  12/03/14   [name] CM Short Term Goal #3 (0-30 days)  Patient will be able to verbalize call primary MD for follow up appointment in 1 week   [name]  CM Short Term Goal #3 Start Date  11/29/14  [facility] CM Short Term Goal #3 Met Date  12/19/14     Joylene Draft, RN, [place] Management [phone]- Mobile [phone]- Toll Free Main Office

## 2014-12-20 DIAGNOSIS — I129 Hypertensive chronic kidney disease with stage 1 through stage 4 chronic kidney disease, or unspecified chronic kidney disease: Secondary | ICD-10-CM | POA: Diagnosis not present

## 2014-12-20 DIAGNOSIS — N390 Urinary tract infection, site not specified: Secondary | ICD-10-CM | POA: Diagnosis not present

## 2014-12-20 DIAGNOSIS — I4891 Unspecified atrial fibrillation: Secondary | ICD-10-CM | POA: Diagnosis not present

## 2014-12-20 DIAGNOSIS — N189 Chronic kidney disease, unspecified: Secondary | ICD-10-CM | POA: Diagnosis not present

## 2014-12-20 DIAGNOSIS — I2510 Atherosclerotic heart disease of native coronary artery without angina pectoris: Secondary | ICD-10-CM | POA: Diagnosis not present

## 2014-12-20 DIAGNOSIS — J441 Chronic obstructive pulmonary disease with (acute) exacerbation: Secondary | ICD-10-CM | POA: Diagnosis not present

## 2014-12-24 ENCOUNTER — Encounter: Payer: Self-pay | Admitting: Licensed Clinical Social Worker

## 2014-12-24 DIAGNOSIS — N390 Urinary tract infection, site not specified: Secondary | ICD-10-CM | POA: Diagnosis not present

## 2014-12-24 DIAGNOSIS — I4891 Unspecified atrial fibrillation: Secondary | ICD-10-CM | POA: Diagnosis not present

## 2014-12-24 DIAGNOSIS — I2510 Atherosclerotic heart disease of native coronary artery without angina pectoris: Secondary | ICD-10-CM | POA: Diagnosis not present

## 2014-12-24 DIAGNOSIS — I129 Hypertensive chronic kidney disease with stage 1 through stage 4 chronic kidney disease, or unspecified chronic kidney disease: Secondary | ICD-10-CM | POA: Diagnosis not present

## 2014-12-24 DIAGNOSIS — J441 Chronic obstructive pulmonary disease with (acute) exacerbation: Secondary | ICD-10-CM | POA: Diagnosis not present

## 2014-12-24 DIAGNOSIS — N189 Chronic kidney disease, unspecified: Secondary | ICD-10-CM | POA: Diagnosis not present

## 2014-12-24 NOTE — Patient Outreach (Signed)
[facility]) Care Management  [facility] Social Work  12/24/2014  [name] [name] 27-Nov-1940 [id number]   Current Medications:  Current Outpatient Prescriptions  Medication Sig Dispense Refill  . albuterol (PROVENTIL) (2.5 MG/3ML) 0.083% nebulizer solution Take 2.5 mg by nebulization every 6 (six) hours as needed for wheezing or shortness of breath.     Marland Kitchen atorvastatin (LIPITOR) 40 MG tablet Take 40 mg by mouth every morning.     . budesonide-formoterol (SYMBICORT) 160-4.5 MCG/ACT inhaler Inhale 2 puffs into the lungs 2 (two) times daily. 1 Inhaler 12  . ciprofloxacin (CIPRO) 500 MG tablet Take 1 tablet (500 mg total) by mouth 2 (two) times daily. For 7 days (Patient not taking: Reported on 12/19/2014) 14 tablet 0  . digoxin (LANOXIN) 0.25 MG tablet Take 0.125 mg by mouth every morning.     . diltiazem (CARDIZEM CD) 240 MG 24 hr capsule Take 240 mg by mouth every morning.   1  . docusate sodium (COLACE) 100 MG capsule Take 100 mg by mouth daily as needed (constipation).     . Ferrous Sulfate (IRON) 325 (65 FE) MG TABS Take 325 mg by mouth daily.    . fluticasone (FLONASE) 50 MCG/ACT nasal spray Place 1 spray into both nostrils daily as needed for allergies or rhinitis.    . furosemide (LASIX) 20 MG tablet Take 1 tablet (20 mg total) by mouth daily. 30 tablet 1  . iron polysaccharides (NIFEREX) 150 MG capsule Take 1 capsule (150 mg total) by mouth daily. (Patient not taking: Reported on 12/19/2014) 60 capsule 1  . metoprolol succinate (TOPROL-XL) 50 MG 24 hr tablet Take 12.5 mg by mouth daily. Take with or immediately following a meal. Take 1/4 of a 50 mg tablet    . Omega-3 Fatty Acids (FISH OIL) 1000 MG CAPS Take 1,000 capsules by mouth daily.    Marland Kitchen oxybutynin (DITROPAN-XL) 5 MG 24 hr tablet Take 10 mg by mouth at bedtime.    . pantoprazole (PROTONIX) 40 MG tablet Take 1 tablet (40 mg total) by mouth daily. 30 tablet 0  . rivaroxaban (XARELTO) 20 MG TABS tablet Take 20 mg by  mouth daily with supper.    . tiotropium (SPIRIVA) 18 MCG inhalation capsule Place 1 capsule (18 mcg total) into inhaler and inhale daily. (Patient not taking: Reported on 12/19/2014) 30 capsule 12  . traMADol (ULTRAM) 50 MG tablet Take 1 tablet (50 mg total) by mouth every 6 (six) hours as needed for moderate pain (pain). (Patient taking differently: Take 50 mg by mouth at bedtime as needed for moderate pain (pain). ) 30 tablet 0  . VENTOLIN HFA 108 (90 BASE) MCG/ACT inhaler Inhale 2 puffs into the lungs 4 (four) times daily as needed for wheezing or shortness of breath.     . vitamin B-12 (CYANOCOBALAMIN) 500 MCG tablet Take 1,000 mcg by mouth daily.      No current facility-administered medications for this visit.    Functional Status:  In your present state of health, do you have any difficulty performing the following activities: 12/06/2014 11/21/2014  Hearing? [name]  Vision? N N  Difficulty concentrating or making decisions? N N  Walking or climbing stairs? N N  Dressing or bathing? N N  Doing errands, shopping? [name]  Preparing Food and eating ? N -  Using the Toilet? N -  In the past six months, have you accidently leaked urine? Y -  Do you have problems with loss of bowel  control? N -  Managing your Medications? N -  Managing your Finances? N -  Housekeeping or managing your Housekeeping? Y -    Fall/Depression Screening:  PHQ 2/9 Scores 11/19/2014 10/25/2014 10/07/2014 10/02/2014 09/20/2014 06/20/2014  PHQ - 2 Score 0 0 0 0 0 0    Assessment: [name] completed outreach to [employer] to follow up on PCS status. [employer] provider informed [name] that initial application was rejected as application did not have correct patient's first name that is assigned to her Medicaid. [name] requested that [employer] directions and PCS application again with attention [name] on fax sheet. [name] attempted outreach to nurse [name] at [employer] office to inform her but she was unavailable at the  time.    Plan-[name] will attempt to contact [facility] on 12/26/14 to follow up with PCS application.   [name], BSW, MSW, [email or web address] Phone: [phone] Fax: [phone]

## 2014-12-26 DIAGNOSIS — J441 Chronic obstructive pulmonary disease with (acute) exacerbation: Secondary | ICD-10-CM | POA: Diagnosis not present

## 2014-12-26 DIAGNOSIS — N189 Chronic kidney disease, unspecified: Secondary | ICD-10-CM | POA: Diagnosis not present

## 2014-12-26 DIAGNOSIS — N390 Urinary tract infection, site not specified: Secondary | ICD-10-CM | POA: Diagnosis not present

## 2014-12-26 DIAGNOSIS — I129 Hypertensive chronic kidney disease with stage 1 through stage 4 chronic kidney disease, or unspecified chronic kidney disease: Secondary | ICD-10-CM | POA: Diagnosis not present

## 2014-12-26 DIAGNOSIS — I2510 Atherosclerotic heart disease of native coronary artery without angina pectoris: Secondary | ICD-10-CM | POA: Diagnosis not present

## 2014-12-26 DIAGNOSIS — I4891 Unspecified atrial fibrillation: Secondary | ICD-10-CM | POA: Diagnosis not present

## 2014-12-27 ENCOUNTER — Other Ambulatory Visit: Payer: Self-pay | Admitting: *Deleted

## 2014-12-27 DIAGNOSIS — J441 Chronic obstructive pulmonary disease with (acute) exacerbation: Secondary | ICD-10-CM | POA: Diagnosis not present

## 2014-12-27 DIAGNOSIS — I4891 Unspecified atrial fibrillation: Secondary | ICD-10-CM | POA: Diagnosis not present

## 2014-12-27 DIAGNOSIS — N390 Urinary tract infection, site not specified: Secondary | ICD-10-CM | POA: Diagnosis not present

## 2014-12-27 DIAGNOSIS — N189 Chronic kidney disease, unspecified: Secondary | ICD-10-CM | POA: Diagnosis not present

## 2014-12-27 DIAGNOSIS — I129 Hypertensive chronic kidney disease with stage 1 through stage 4 chronic kidney disease, or unspecified chronic kidney disease: Secondary | ICD-10-CM | POA: Diagnosis not present

## 2014-12-27 DIAGNOSIS — J449 Chronic obstructive pulmonary disease, unspecified: Secondary | ICD-10-CM | POA: Diagnosis not present

## 2014-12-27 DIAGNOSIS — I2510 Atherosclerotic heart disease of native coronary artery without angina pectoris: Secondary | ICD-10-CM | POA: Diagnosis not present

## 2014-12-27 NOTE — Patient Outreach (Signed)
[facility]) Care Management  12/27/2014  [name] [name] [birth year] [id number]   Transition of care call [name] reports doing okay, states that her breathing is good, and her blood pressure is good. She reports having [facility] on today. [name] informed me that she has decided to stay with [facility] for her home care services, instead of changing back to [place], she reports that she informed care [facility] of this on yesterday. Patient denies having any other concerns or issues to be addressed .  Plan: Continue transition of care Encouraged patient to notify Primary Care Provider of concerns Reviewed my contact information Notify [place] office that [name] has decided to stay with [place], left message with Pandora from the office. Home visit scheduled for October 27  Joylene Draft, [place], [place] [facility] Management [phone] Mobile [phone]- [place]

## 2014-12-30 ENCOUNTER — Other Ambulatory Visit: Payer: Self-pay | Admitting: Licensed Clinical Social Worker

## 2014-12-30 DIAGNOSIS — N390 Urinary tract infection, site not specified: Secondary | ICD-10-CM | POA: Diagnosis not present

## 2014-12-30 DIAGNOSIS — N189 Chronic kidney disease, unspecified: Secondary | ICD-10-CM | POA: Diagnosis not present

## 2014-12-30 DIAGNOSIS — I129 Hypertensive chronic kidney disease with stage 1 through stage 4 chronic kidney disease, or unspecified chronic kidney disease: Secondary | ICD-10-CM | POA: Diagnosis not present

## 2014-12-30 DIAGNOSIS — J441 Chronic obstructive pulmonary disease with (acute) exacerbation: Secondary | ICD-10-CM | POA: Diagnosis not present

## 2014-12-30 DIAGNOSIS — I4891 Unspecified atrial fibrillation: Secondary | ICD-10-CM | POA: Diagnosis not present

## 2014-12-30 DIAGNOSIS — I2510 Atherosclerotic heart disease of native coronary artery without angina pectoris: Secondary | ICD-10-CM | POA: Diagnosis not present

## 2014-12-30 NOTE — Patient Outreach (Signed)
[name]) Care Management  12/30/2014  [name] [name] 18-Feb-1941 [id number]   Assessment-[name] contacted [facility] and was informed that [facility] referral form has been rejected again (12/25/14) as form put a date for last visit past the 90 days that it is required. Patient has been seen by PCP within the past month and this has been the second time the [facility] referral has been rejected for this reason. [name] informed [name] last week of this correction that was needed. [name] informed RNCM Landis Martins on these updates and she was agreeable to hand delivering a new PCS application to office. [name] contacted [name], nurse of PCP at [facility] to update her. [name] stated that they may have put wrong date for last visit as her last visit was for a UTI and not a physical exam. [name] informed her that any visit would suffice but that it has to be within the last 90 days. [name] communicated frustrations for over 5 rejections on PCS application. [name] informed her that RNCM will be bringing a copy of the PCS form to be hand delivered this week. [name] encouraged [name] to contact [facility] with any questions on how to complete referral form. [name] provided contact number to [place]. [name] stated that she would write in the correct date in regards to last visit instead of having to do entire form again and [name] suggested that she contact [facility] office as they may not be able to accept correction referral forms.  Plan-[name] will continue to encourage communication between [facility] and [facility]. [name] will contact [facility] next week to see if referral form had been submitted again.  [name], BSW, MSW, [name] Phone: [phone] Fax: [phone]

## 2015-01-03 DIAGNOSIS — I4891 Unspecified atrial fibrillation: Secondary | ICD-10-CM | POA: Diagnosis not present

## 2015-01-03 DIAGNOSIS — I129 Hypertensive chronic kidney disease with stage 1 through stage 4 chronic kidney disease, or unspecified chronic kidney disease: Secondary | ICD-10-CM | POA: Diagnosis not present

## 2015-01-03 DIAGNOSIS — J441 Chronic obstructive pulmonary disease with (acute) exacerbation: Secondary | ICD-10-CM | POA: Diagnosis not present

## 2015-01-03 DIAGNOSIS — N390 Urinary tract infection, site not specified: Secondary | ICD-10-CM | POA: Diagnosis not present

## 2015-01-03 DIAGNOSIS — I2510 Atherosclerotic heart disease of native coronary artery without angina pectoris: Secondary | ICD-10-CM | POA: Diagnosis not present

## 2015-01-03 DIAGNOSIS — N189 Chronic kidney disease, unspecified: Secondary | ICD-10-CM | POA: Diagnosis not present

## 2015-01-06 DIAGNOSIS — J441 Chronic obstructive pulmonary disease with (acute) exacerbation: Secondary | ICD-10-CM | POA: Diagnosis not present

## 2015-01-06 DIAGNOSIS — I129 Hypertensive chronic kidney disease with stage 1 through stage 4 chronic kidney disease, or unspecified chronic kidney disease: Secondary | ICD-10-CM | POA: Diagnosis not present

## 2015-01-06 DIAGNOSIS — N390 Urinary tract infection, site not specified: Secondary | ICD-10-CM | POA: Diagnosis not present

## 2015-01-06 DIAGNOSIS — N189 Chronic kidney disease, unspecified: Secondary | ICD-10-CM | POA: Diagnosis not present

## 2015-01-06 DIAGNOSIS — I4891 Unspecified atrial fibrillation: Secondary | ICD-10-CM | POA: Diagnosis not present

## 2015-01-06 DIAGNOSIS — I2510 Atherosclerotic heart disease of native coronary artery without angina pectoris: Secondary | ICD-10-CM | POA: Diagnosis not present

## 2015-01-08 ENCOUNTER — Other Ambulatory Visit: Payer: Self-pay | Admitting: Licensed Clinical Social Worker

## 2015-01-08 NOTE — Patient Outreach (Signed)
  [facility]) Care Management  01/08/2015  RAYYAN ORSBORN [birth year] [id number]   Assessment-[name] contacted [facility] to check on status of personal care services. Referral form has been accepted and [facility] plans to contact patient within the next few days to schedule a home assessment. [name] contacted patient and provided updated information on personal care services. Patient reports "this will help me a lot so I can stay home." [name] reminded her of upcoming appointments. [name] informed patient that she would check on status of personal care services after assessment has been completed.  Plan-[name] will update RNCM. [name] will remain available to patient.

## 2015-01-09 ENCOUNTER — Other Ambulatory Visit: Payer: Self-pay | Admitting: *Deleted

## 2015-01-09 DIAGNOSIS — J441 Chronic obstructive pulmonary disease with (acute) exacerbation: Secondary | ICD-10-CM | POA: Diagnosis not present

## 2015-01-09 DIAGNOSIS — I129 Hypertensive chronic kidney disease with stage 1 through stage 4 chronic kidney disease, or unspecified chronic kidney disease: Secondary | ICD-10-CM | POA: Diagnosis not present

## 2015-01-09 DIAGNOSIS — I2510 Atherosclerotic heart disease of native coronary artery without angina pectoris: Secondary | ICD-10-CM | POA: Diagnosis not present

## 2015-01-09 DIAGNOSIS — I4891 Unspecified atrial fibrillation: Secondary | ICD-10-CM | POA: Diagnosis not present

## 2015-01-09 DIAGNOSIS — N189 Chronic kidney disease, unspecified: Secondary | ICD-10-CM | POA: Diagnosis not present

## 2015-01-09 DIAGNOSIS — N390 Urinary tract infection, site not specified: Secondary | ICD-10-CM | POA: Diagnosis not present

## 2015-01-09 NOTE — Patient Outreach (Signed)
[facility]) Care Management   01/09/2015  AVANNI TURNBAUGH 1940/08/20 [id number]  LISL SLINGERLAND is an 74 y.o. female  Subjective: " I am doing good today", I have graduated from my home health physical therapy and today was the last day with the [facility] home care nurse".  Objective:   Review of Systems  Constitutional: Negative for fever.  HENT: Negative.  Negative for congestion.   Eyes: Negative.   Respiratory: Positive for sputum production.   Cardiovascular: Negative for chest pain.  Gastrointestinal: Negative.   Genitourinary: Negative for dysuria.  Musculoskeletal: Negative for falls.  Skin: Negative.   Neurological: Negative for dizziness and weakness.  Psychiatric/Behavioral: Negative for depression.    Physical Exam  Constitutional: She is oriented to person, place, and time. She appears well-developed.  Very thin  Cardiovascular: Normal rate and regular rhythm.   Respiratory: Effort normal. She has no wheezes.  Breath sounds, equal slightly diminished   Neurological: She is alert and oriented to person, place, and time.  Skin: Skin is warm and dry.  Healed area of previous chest tube site  Psychiatric: She has a normal mood and affect. Her behavior is normal. Judgment and thought content normal.   BP 140/70 mmHg  Pulse 60  Resp 20  SpO2 97% Current Medications:   Current Outpatient Prescriptions  Medication Sig Dispense Refill  . albuterol (PROVENTIL) (2.5 MG/3ML) 0.083% nebulizer solution Take 2.5 mg by nebulization every 6 (six) hours as needed for wheezing or shortness of breath.     Marland Kitchen atorvastatin (LIPITOR) 40 MG tablet Take 40 mg by mouth every morning.     . budesonide-formoterol (SYMBICORT) 160-4.5 MCG/ACT inhaler Inhale 2 puffs into the lungs 2 (two) times daily. 1 Inhaler 12  . digoxin (LANOXIN) 0.25 MG tablet Take 0.125 mg by mouth every morning.     . diltiazem (CARDIZEM CD) 240 MG 24 hr capsule Take 240 mg by mouth every  morning.   1  . docusate sodium (COLACE) 100 MG capsule Take 100 mg by mouth daily as needed (constipation).     . Ferrous Sulfate (IRON) 325 (65 FE) MG TABS Take 325 mg by mouth daily.    . fluticasone (FLONASE) 50 MCG/ACT nasal spray Place 1 spray into both nostrils daily as needed for allergies or rhinitis.    . furosemide (LASIX) 20 MG tablet Take 1 tablet (20 mg total) by mouth daily. 30 tablet 1  . metoprolol succinate (TOPROL-XL) 50 MG 24 hr tablet Take 12.5 mg by mouth daily. Take with or immediately following a meal. Take 1/4 of a 50 mg tablet    . Omega-3 Fatty Acids (FISH OIL) 1000 MG CAPS Take 1,000 capsules by mouth daily.    Marland Kitchen oxybutynin (DITROPAN-XL) 5 MG 24 hr tablet Take 10 mg by mouth at bedtime.    . pantoprazole (PROTONIX) 40 MG tablet Take 1 tablet (40 mg total) by mouth daily. 30 tablet 0  . rivaroxaban (XARELTO) 20 MG TABS tablet Take 20 mg by mouth daily with supper.    . traMADol (ULTRAM) 50 MG tablet Take 1 tablet (50 mg total) by mouth every 6 (six) hours as needed for moderate pain (pain). (Patient taking differently: Take 50 mg by mouth at bedtime as needed for moderate pain (pain). ) 30 tablet 0  . VENTOLIN HFA 108 (90 BASE) MCG/ACT inhaler Inhale 2 puffs into the lungs 4 (four) times daily as needed for wheezing or shortness of breath.     Marland Kitchen  vitamin B-12 (CYANOCOBALAMIN) 500 MCG tablet Take 1,000 mcg by mouth daily.     . ciprofloxacin (CIPRO) 500 MG tablet Take 1 tablet (500 mg total) by mouth 2 (two) times daily. For 7 days (Patient not taking: Reported on 12/19/2014) 14 tablet 0  . iron polysaccharides (NIFEREX) 150 MG capsule Take 1 capsule (150 mg total) by mouth daily. (Patient not taking: Reported on 12/19/2014) 60 capsule 1  . tiotropium (SPIRIVA) 18 MCG inhalation capsule Place 1 capsule (18 mcg total) into inhaler and inhale daily. (Patient not taking: Reported on 12/19/2014) 30 capsule 12   No current facility-administered medications for this visit.     Functional Status:   In your present state of health, do you have any difficulty performing the following activities: 12/06/2014 11/21/2014  Hearing? [name]  Vision? N N  Difficulty concentrating or making decisions? N N  Walking or climbing stairs? N N  Dressing or bathing? N N  Doing errands, shopping? [name]  Preparing Food and eating ? N -  Using the Toilet? N -  In the past six months, have you accidently leaked urine? Y -  Do you have problems with loss of bowel control? N -  Managing your Medications? N -  Managing your Finances? N -  Housekeeping or managing your Housekeeping? Y -    Fall/Depression Screening:    PHQ 2/9 Scores 11/19/2014 10/25/2014 10/07/2014 10/02/2014 09/20/2014 06/20/2014  PHQ - 2 Score 0 0 0 0 0 0    Assessment:   Copd: Patient reports that her breathing is in the green zone, no episodes of increased shortness of breath, sputum production, she continues to carry her rescue inhaler with her when walking outside in case its needed, and wearing continuous oxygen at 2 liters nasal cannula.  Recent Pnuemothorax; Denies increase shortness of breath problems or pain, site healed at previous chest tube site. She has a follow up appointment with Dr.Vantrigt on November 9, her son [name] will arrange transportation to appointment using the [employer] program which Mrs.Herman is well pleased with.  Medication Adherence: Mrs.Lightcap continues to report taking her daily medications as prescribed, and using a daily pill box. She requested that I call in some of prescriptions that needed to be refilled  on today to [facility].  Mrs.Mccollum reports that her appetite is good,tolerating eating well. She also voiced looking forward to her meals on wheels lunch and also discussed that she will be getting out of the house attending a holiday function at church in November and December. Mrs.Melander voiced looking forward to having assistance at home,as she  discussed that social worker has  updated her that her application has been accepted for PCS.    Plan:  Will review progress toward patient centered  care goals Encouraged to notify me or MD for concerns or 911 for emergency Patient will attend scheduled follow up appointment with Dr.Vantrigt Plan home visit in November 28.1000   [facility] CM Care Plan Problem One        Most Recent Value   Care Plan Problem One  Adherence to COPD action plan   Role Documenting the Problem One  Care Management Coordinator   Care Plan for Problem One  Not Active   [name] Long Term Goal (31-90 days)  Patient will be able to follow the COPD action plan and notify MD for symptoms in the yellow Zones in the next 31 days   [name] Long Term Goal Start Date  05/24/14  [name] Long Term Goal Met Date  07/30/14   [name] CM Short Term Goal #1 (0-30 days)  Patient will be able to state the symptoms  in the yellow zone that she should call the MD about in the next 30 days   [name] CM Short Term Goal #1 Start Date  05/24/14   [facility] CM Short Term Goal #1 Met Date  06/20/14   [name] CM Short Term Goal #2 (0-30 days)  patient will review the COPD material within the next 30 days    [name] CM Short Term Goal #2 Start Date  05/24/14   [facility] CM Short Term Goal #2 Met Date  06/20/14    [facility] CM Care Plan Problem Two        Most Recent Value   Care Plan Problem Two  Fall Risk   Role Documenting the Problem Two  Care Management Coordinator   Care Plan for Problem Two  Not Active   [name] Long Term Goal (31-90) days  Patient will not experience a fall in the next 31 days   [name] Long Term Goal Start Date  05/24/14   [name] Long Term Goal Met Date  06/20/14   [name] CM Short Term Goal #1 (0-30 days)  Patient will use safety equipment consistenly within the next 30 days to decrease fall   [name] CM Short Term Goal #1 Start Date  05/24/14   [facility] CM Short Term Goal #1 Met Date   06/20/14   [name] CM Short Term Goal #2 (0-30 days)  Pt will discuss  with MD on next appointment about physical therapy for  strengthen   [facility] CM Short Term Goal #2 Start Date  05/24/14   [facility] CM Short Term Goal #2 Met Date  06/20/14   [name] CM Short Term Goal #3 (0-30 days)  Patient will report completing chair exercise at least 3 days a week as tolerated   [name] CM Short Term Goal #3 Start Date  01/09/15   Interventions for Short Term Goal #3  Reviewed exercises in book supplied by recent home health physical therapist    [facility] CM Care Plan Problem Three        Most Recent Value   Care Plan Problem Three  Frequent Hospital Admissions   Role Documenting the Problem [place] for Problem Three  Active   [name] Long Term Goal (31-90) days  Patient will not have a hospital admisssion in the next 31 days   [place] Term Goal Start Date  11/29/14   [facility] Long Term Goal Met Date  -- [goal not met, date restarted.]   Interventions for Problem Three Long Term Goal  Reviewed with patient,to notify MD of sudden chest pain,sob.Reviewed causes and signs and symptoms of recurrent pneumothorax     [name] CM Short Term Goal #1 (0-30 days)  Patient will not experience  hospital admission in the next 30 days   [name] CM Short Term Goal #1 Start Date  11/11/14   [facility] CM Short Term Goal #1 Met Date  -- [goal not met. Readmitted on 9/7]   Interventions for Short Term Goal #1  Reviewed importance of following MD recommendations of keeping dressing in place for  72 hours   [name] CM Short Term Goal #2 (0-30 days)  Patient will be verbalized home health nurse visit by 12/03/2014   [facility] CM Short Term Goal #2 Start Date  11/29/14   [facility] CM Short  Term Goal #2 Met Date  12/03/14   [name] CM Short Term Goal #3 (0-30 days)  Patient will be able to verbalize call primary MD for follow up appointment in 1 week   [facility]  CM Short Term Goal #3 Start Date  11/29/14   [facility] CM Short Term Goal #3 Met Date  12/19/14     Joylene Draft, RN, [place] Management [phone]- Mobile [phone]- [place]

## 2015-01-15 DIAGNOSIS — J449 Chronic obstructive pulmonary disease, unspecified: Secondary | ICD-10-CM | POA: Diagnosis not present

## 2015-01-20 ENCOUNTER — Other Ambulatory Visit: Payer: Self-pay | Admitting: Licensed Clinical Social Worker

## 2015-01-20 ENCOUNTER — Other Ambulatory Visit: Payer: Self-pay | Admitting: Cardiothoracic Surgery

## 2015-01-20 DIAGNOSIS — J432 Centrilobular emphysema: Secondary | ICD-10-CM | POA: Diagnosis not present

## 2015-01-20 DIAGNOSIS — J939 Pneumothorax, unspecified: Secondary | ICD-10-CM

## 2015-01-20 DIAGNOSIS — I10 Essential (primary) hypertension: Secondary | ICD-10-CM | POA: Diagnosis not present

## 2015-01-20 DIAGNOSIS — I480 Paroxysmal atrial fibrillation: Secondary | ICD-10-CM | POA: Diagnosis not present

## 2015-01-20 DIAGNOSIS — I6523 Occlusion and stenosis of bilateral carotid arteries: Secondary | ICD-10-CM | POA: Diagnosis not present

## 2015-01-20 NOTE — Patient Outreach (Signed)
[place] [facility]) Care Management  01/20/2015  [name] [name] 02-11-1941 [id number]   Assessment-[name] contacted [name] [name] to inquire on patient's personal care service status and it was confirmed that she was approved for services. However, they are waiting to hear back from patient and her decision on which agency she chooses for personal care services. [name] was informed that she needs to contact [name] [name] soon to inform them. [name] contacted patient's son [name] [name] and HIPPA verifications were received. [name] [name] was currently taking patient to a medical appointment. [name] provided updated information to son. Son agreed to contact [name] [name] as patient was unaware she was approved for services and will need to know which agencies to choose from. [name] provided [name] [name] with [name] [name] number and instructions on how to reach staff.  Plan-[name] will follow up with personal care services status on 01/24/15. [name] will inform RNCM as well.   [name] [name], BSW, MSW, [name] Phone: [phone] Fax: [phone]

## 2015-01-22 ENCOUNTER — Ambulatory Visit
Admission: RE | Admit: 2015-01-22 | Discharge: 2015-01-22 | Disposition: A | Discharge status: Home / Self Care | Payer: Commercial Managed Care - HMO | Source: Ambulatory Visit | Attending: Cardiothoracic Surgery | Admitting: Cardiothoracic Surgery

## 2015-01-22 ENCOUNTER — Encounter: Payer: Commercial Managed Care - HMO | Admitting: Cardiothoracic Surgery

## 2015-01-22 ENCOUNTER — Encounter: Payer: Self-pay | Admitting: *Deleted

## 2015-01-22 DIAGNOSIS — J939 Pneumothorax, unspecified: Secondary | ICD-10-CM | POA: Diagnosis not present

## 2015-01-22 NOTE — Progress Notes (Signed)
Patient ID: [name] [name], female   DOB: [birth year], 74 y.o.   MRN: [id number] Ms. Sparrow returned to the office today to see 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 Due to being in surgery [name] was unable to see her. I had him review the chest xray later in the day. The results were normal.  He said to inform her of the findings and there would be no further follow up needed. I called her, explained this and she understood.

## 2015-01-27 DIAGNOSIS — J449 Chronic obstructive pulmonary disease, unspecified: Secondary | ICD-10-CM | POA: Diagnosis not present

## 2015-01-29 ENCOUNTER — Other Ambulatory Visit: Payer: Self-pay | Admitting: Licensed Clinical Social Worker

## 2015-01-29 NOTE — Patient Outreach (Signed)
[facility]) Care Management  01/29/2015  [name] [name] 1940/05/28 [id number]  Assessment-[name] completed outreach to patient on 01/29/15. [name] questioned on whether patient has started receiving PCS at this time. Patient declined and stated that she has not heard from [facility]. [name] informed her that she had informed her son [name] on 01/20/15 that patient needed to choose [facility] provider agency. Patient reports that she lost her list of providers. [name] contacted [facility] and they informed [name] that she initially chose [facility] but that she was denied and she is in need of choosing another agency. [place] reports that they tried to contact patient but could not reach anyone. Patient has 30 days since acceptance date to choose an agency. [name] contacted [name] and left a HIPPA compliant voice message. [name] then contacted patient back to update her. [name] will mail out a list of PCS providers to patient's residence. [name] received call back from [name] who questioned if [facility] was on the list of providers and [name] confirmed that it was. [name] plans to discuss agency with patient and then contact [place] to choose this agency as patient's [facility] provider with patient's permission. [name] provided contact information.  Plan-[name] will follow up by 02/03/15.  [name], BSW, MSW, [email or web address] Phone: [phone] Fax: [phone]

## 2015-01-30 DIAGNOSIS — I6523 Occlusion and stenosis of bilateral carotid arteries: Secondary | ICD-10-CM | POA: Diagnosis not present

## 2015-01-30 NOTE — Patient Outreach (Signed)
[place]) Care Management  01/30/2015  [name] [name] 18-Nov-1940 [id number]   Request received from [name], LCSW to send patient a list of personal care services agencies. Packet was sent 01/30/2015.  Lem Peary L. Emiliana Blaize, [place] Care Management Assistant

## 2015-02-05 ENCOUNTER — Other Ambulatory Visit: Payer: Self-pay | Admitting: Licensed Clinical Social Worker

## 2015-02-05 NOTE — Patient Outreach (Signed)
[facility]) Care Management  02/05/2015  KAMILLIA SELF 1940/04/11 [id number]   Assessment- [name] completed outreach to patient's son [name] on 02/05/15. Son reports that he called [facility] this weekend but that office was closed. [name] shares that patient has chosen Nurse, learning disability as her [employer]. [name] informed [name] that patient will need to be with him in order for him to call. Patient's son was informed that she has until 02/21/15 to choose an agency.  Plan-[name] will contact patient within one week to follow up with PCS referral.  [name], BSW, MSW, [email or web address] Phone: [phone] Fax: [phone]

## 2015-02-10 ENCOUNTER — Other Ambulatory Visit: Payer: Self-pay | Admitting: *Deleted

## 2015-02-10 ENCOUNTER — Other Ambulatory Visit: Payer: Self-pay | Admitting: Licensed Clinical Social Worker

## 2015-02-10 NOTE — Patient Outreach (Signed)
[facility]) Care Management  02/10/2015  [name] [name] 1940/10/11 [id number]   Assessment-[name] contacted [place] on 02/10/15. [name] questioned if RNCM would be able to assist patient today by contacting [facility] as she will be completing home visit. [name] questioned if she would assist patient in [place] agency she wishes to use as she has a limited amount of time to do this and patient's son cannot assist as he works during the week and must be with her in order to choose PCS agency. [name] informed RNCM that patient's son has stated that she has chosen [facility]. RNCM agreeable to assist patient during home visit.  Plan-[name] will follow up with [place].  [name], BSW, MSW, [email or web address] Phone: [phone] Fax: [phone]

## 2015-02-10 NOTE — Patient Outreach (Cosign Needed)
[facility]) Care Management   02/10/2015  [name] [name] 01/21/1941 [id number]  [name] [name] is an 74 y.o. female  Subjective: "I need you to help me with calling this agency about getting some help in the home.  Objective:   Review of Systems  Constitutional: Negative.   HENT: Negative.   Respiratory: Negative.   Cardiovascular: Negative.   Genitourinary: Negative.   Musculoskeletal: Negative.   Skin: Negative.   Neurological: Negative.   Psychiatric/Behavioral: Negative.  Negative for depression. The patient is not nervous/anxious.     Physical Exam  Constitutional: She is oriented to person, place, and time. She appears well-developed and well-nourished.  Cardiovascular: Normal rate and regular rhythm.   Respiratory: Effort normal. She has no wheezes.  Bilateral breath sounds diminished  Neurological: She is oriented to person, place, and time.  Skin: Skin is warm and dry.  Psychiatric: She has a normal mood and affect. Her behavior is normal. Judgment and thought content normal.    Current Medications:   Current Outpatient Prescriptions  Medication Sig Dispense Refill  . albuterol (PROVENTIL) (2.5 MG/3ML) 0.083% nebulizer solution Take 2.5 mg by nebulization every 6 (six) hours as needed for wheezing or shortness of breath.     Marland Kitchen atorvastatin (LIPITOR) 40 MG tablet Take 40 mg by mouth every morning.     . budesonide-formoterol (SYMBICORT) 160-4.5 MCG/ACT inhaler Inhale 2 puffs into the lungs 2 (two) times daily. 1 Inhaler 12  . digoxin (LANOXIN) 0.25 MG tablet Take 0.125 mg by mouth every morning.     . diltiazem (CARDIZEM CD) 240 MG 24 hr capsule Take 240 mg by mouth every morning.   1  . docusate sodium (COLACE) 100 MG capsule Take 100 mg by mouth daily as needed (constipation).     . Ferrous Sulfate (IRON) 325 (65 FE) MG TABS Take 325 mg by mouth daily.    . fluticasone (FLONASE) 50 MCG/ACT nasal spray Place 1 spray into both nostrils daily as  needed for allergies or rhinitis.    . furosemide (LASIX) 20 MG tablet Take 1 tablet (20 mg total) by mouth daily. 30 tablet 1  . metoprolol succinate (TOPROL-XL) 50 MG 24 hr tablet Take 12.5 mg by mouth daily. Take with or immediately following a meal. Take 1/4 of a 50 mg tablet    . Omega-3 Fatty Acids (FISH OIL) 1000 MG CAPS Take 1,000 capsules by mouth daily.    . pantoprazole (PROTONIX) 40 MG tablet Take 1 tablet (40 mg total) by mouth daily. 30 tablet 0  . rivaroxaban (XARELTO) 20 MG TABS tablet Take 20 mg by mouth daily with supper.    . tiotropium (SPIRIVA) 18 MCG inhalation capsule Place 1 capsule (18 mcg total) into inhaler and inhale daily. 30 capsule 12  . traMADol (ULTRAM) 50 MG tablet Take 1 tablet (50 mg total) by mouth every 6 (six) hours as needed for moderate pain (pain). (Patient taking differently: Take 50 mg by mouth at bedtime as needed for moderate pain (pain). ) 30 tablet 0  . VENTOLIN HFA 108 (90 BASE) MCG/ACT inhaler Inhale 2 puffs into the lungs 4 (four) times daily as needed for wheezing or shortness of breath.     . vitamin B-12 (CYANOCOBALAMIN) 500 MCG tablet Take 1,000 mcg by mouth daily.     . ciprofloxacin (CIPRO) 500 MG tablet Take 1 tablet (500 mg total) by mouth 2 (two) times daily. For 7 days (Patient not taking: Reported on 12/19/2014) 14  tablet 0  . iron polysaccharides (NIFEREX) 150 MG capsule Take 1 capsule (150 mg total) by mouth daily. (Patient not taking: Reported on 12/19/2014) 60 capsule 1  . oxybutynin (DITROPAN-XL) 5 MG 24 hr tablet Take 10 mg by mouth at bedtime.     No current facility-administered medications for this visit.    Functional Status:   In your present state of health, do you have any difficulty performing the following activities: 12/06/2014 11/21/2014  Hearing? [name]  Vision? N N  Difficulty concentrating or making decisions? N N  Walking or climbing stairs? N N  Dressing or bathing? N N  Doing errands, shopping? [name]  Preparing Food  and eating ? N -  Using the Toilet? N -  In the past six months, have you accidently leaked urine? Y -  Do you have problems with loss of bowel control? N -  Managing your Medications? N -  Managing your Finances? N -  Housekeeping or managing your Housekeeping? Y -    Fall/Depression Screening:    PHQ 2/9 Scores 11/19/2014 10/25/2014 10/07/2014 10/02/2014 09/20/2014 06/20/2014  PHQ - 2 Score 0 0 0 0 0 0    Assessment:    Routine home visit , patient concerns today are getting assistance with calling [place] to give them her selection for PCS it is difficult for her to hear on the phone most times , Mrs. Kilcrease has chosen [facility], I assisted her with making the phone call to [facility] so that she could notify them that she has chosen [place] to provide her [employer].  Patients other concern was " that I have been switched from [facility] to [facility] healthplan for the new year", patient states that she wanted to keep [employer], and she did not sign up with another plan, she received no phone call about this. Mrs. Denis showed me 2 letters one from [facility] welcoming her and one from [place] confirming that she chosen another medicare advantage plan. We placed a call to [facility] to try and determine how she was enrolled in this program but we were unsuccessful .  Mrs.Martel wanted to make sure her son [name] was aware of the letters, I placed a phone call to him and had to leave a message. Mrs.Pennella plans to have [employer] to let them know she wants to remain with [facility] and will call [employer] to notify them.  COPD : Patient reports her breathing is in the green zone on today, she continues to wear continuous oxygen at 2 liters. No exacerbation's of COPD. [name] continues to be able tolerate her normal activities, including being able to walk outside on the sidewalk when it warms up during the day and she always has her rescue inhaler with her incase she needs  it.  Medication Adherence: Mrs.Assefa reports taking her medication as prescribed, noted on medication review patient is  out of Ditropan since see requested a refill on November 7, followed with pharmacist at [facility] and he call [place] office for a refill today, and plan to deliver patients other needed refills on today.  Reviewed with patient the importance of notifying RN, MD with new symptoms or concerns and EMS for emergencies  Plan:  Follow up with Mrs.Cedillo's son per her request related to her insurance plan questions, also I will collaborate with Deanne Coffer, Pharmacist  regarding [facility] prescription plan as to why patient may have been enrolled in.  Review and update patient  centered care goals Plan home visit on December 8   [facility] CM Care Plan Problem One        Most Recent Value   Care Plan Problem One  Adherence to COPD action plan   Role Documenting the Problem One  Care Management Coordinator   Care Plan for Problem One  Not Active   [name] Long Term Goal (31-90 days)  Patient will be able to follow the COPD action plan and notify MD for symptoms in the yellow Zones in the next 31 days   [name] Long Term Goal Start Date  05/24/14   [name] Long Term Goal Met Date  07/30/14   [name] CM Short Term Goal #1 (0-30 days)  Patient will be able to state the symptoms  in the yellow zone that she should call the MD about in the next 30 days   [name] CM Short Term Goal #1 Start Date  05/24/14   [facility] CM Short Term Goal #1 Met Date  06/20/14   [name] CM Short Term Goal #2 (0-30 days)  patient will review the COPD material within the next 30 days    [name] CM Short Term Goal #2 Start Date  05/24/14   [facility] CM Short Term Goal #2 Met Date  06/20/14    [facility] CM Care Plan Problem Two        Most Recent Value   Care Plan Problem Two  Fall Risk   Role Documenting the Problem Two  Care Management Coordinator   Care Plan for Problem Two  Not Active   [name] Long Term Goal (31-90) days  Patient will not  experience a fall in the next 31 days   [name] Long Term Goal Start Date  05/24/14   [name] Long Term Goal Met Date  06/20/14   [name] CM Short Term Goal #1 (0-30 days)  Patient will use safety equipment consistenly within the next 30 days to decrease fall   [name] CM Short Term Goal #1 Start Date  05/24/14   [facility] CM Short Term Goal #1 Met Date   06/20/14   [name] CM Short Term Goal #2 (0-30 days)  Pt will discuss  with MD on next appointment about physical therapy for strengthen   [facility] CM Short Term Goal #2 Start Date  05/24/14   [facility] CM Short Term Goal #2 Met Date  06/20/14   [name] CM Short Term Goal #3 (0-30 days)  Patient will report completing chair exercise at least 3 days a week as tolerated   [name] CM Short Term Goal #3 Start Date  01/09/15   [facility] CM Short Term Goal #3 Met Date  02/10/15    [facility] CM Care Plan Problem Three        Most Recent Value   Care Plan Problem Three  Frequent Hospital Admissions   Role Documenting the Problem [place] for Problem Three  Active   [name] Long Term Goal (31-90) days  Patient will not have a hospital admisssion in the next 31 days   [place] Term Goal Start Date  11/29/14   [place] Term Goal Met Date  12/29/14 [goal not met, date restarted.]   [name] CM Short Term Goal #1 (0-30 days)  Patient will not experience  hospital admission in the next 30 days   [name] CM Short Term Goal #1 Start Date  11/11/14   [facility] CM Short Term Goal #1 Met Date  12/30/14 [  goal not met. Readmitted on 9/7]   [name] CM Short Term Goal #2 (0-30 days)  Patient will be verbalized home health nurse visit by 12/03/2014   [facility] CM Short Term Goal #2 Start Date  11/29/14   [name] CM Short Term Goal #2 Met Date  12/03/14   [name] CM Short Term Goal #3 (0-30 days)  Patient will be able to verbalize call primary MD for follow up appointment in 1 week   [facility]  CM Short Term Goal #3 Start Date  11/29/14   [facility] CM Short Term Goal #3 Met Date  12/19/14     Joylene Draft, RN, [place] Management [phone]- Mobile [phone]- Agenda

## 2015-02-14 DIAGNOSIS — J449 Chronic obstructive pulmonary disease, unspecified: Secondary | ICD-10-CM | POA: Diagnosis not present

## 2015-02-18 ENCOUNTER — Other Ambulatory Visit: Payer: Self-pay | Admitting: Licensed Clinical Social Worker

## 2015-02-18 NOTE — Patient Outreach (Signed)
[facility]) Care Management  02/18/2015  [name] [name] [birth year] [id number]   Assessment-[name] contacted [name] [name] to inquire about patient's personal care services application status. [name] was informed that she was rejected by [facility] [name] contacted patient to inform her of this update. [name] has scheduled a home visit with patient on 02/27/15 at 11:00 to contact [facility] again to pick another agency.  Plan-[name] will complete home visit and assist with personal care services.  [name], BSW, MSW, [email or web address] Phone: [phone] Fax: [phone]

## 2015-02-18 NOTE — Patient Outreach (Signed)
[facility]) Care Management  02/18/2015  [name] [name] Jul 27, 1940 [id number]   Assessment-[name] updated RNCM Landis Martins on PCS status. [name] will complete home visit on 02/20/15 at instead in order to assist patient in choosing PCS agency. [name] contacted patient's son and left a voice message informing him of [facility] rejection and asking him to please choose another PCS agency within two days so that [place] worker can assist patient with gaining personal care services.  Plan-[name] will await to hear back from patient's son. [name] will continue to assist patient in gaining personal care services.  [name], BSW, MSW, [email or web address] Phone: [phone] Fax: [phone]

## 2015-02-20 ENCOUNTER — Other Ambulatory Visit: Payer: Self-pay | Admitting: Licensed Clinical Social Worker

## 2015-02-20 ENCOUNTER — Other Ambulatory Visit: Payer: Self-pay | Admitting: *Deleted

## 2015-02-20 NOTE — Patient Outreach (Signed)
[name] [name]) Care Management  [facility] Social Work  02/20/2015  [name] [name] 11/30/1940 [id number]    Current Medications:  Current Outpatient Prescriptions  Medication Sig Dispense Refill  . albuterol (PROVENTIL) (2.5 MG/3ML) 0.083% nebulizer solution Take 2.5 mg by nebulization every 6 (six) hours as needed for wheezing or shortness of breath.     Marland Kitchen atorvastatin (LIPITOR) 40 MG tablet Take 40 mg by mouth every morning.     . budesonide-formoterol (SYMBICORT) 160-4.5 MCG/ACT inhaler Inhale 2 puffs into the lungs 2 (two) times daily. 1 Inhaler 12  . ciprofloxacin (CIPRO) 500 MG tablet Take 1 tablet (500 mg total) by mouth 2 (two) times daily. For 7 days (Patient not taking: Reported on 12/19/2014) 14 tablet 0  . digoxin (LANOXIN) 0.25 MG tablet Take 0.125 mg by mouth every morning.     . diltiazem (CARDIZEM CD) 240 MG 24 hr capsule Take 240 mg by mouth every morning.   1  . docusate sodium (COLACE) 100 MG capsule Take 100 mg by mouth daily as needed (constipation).     . Ferrous Sulfate (IRON) 325 (65 FE) MG TABS Take 325 mg by mouth daily.    . fluticasone (FLONASE) 50 MCG/ACT nasal spray Place 1 spray into both nostrils daily as needed for allergies or rhinitis.    . furosemide (LASIX) 20 MG tablet Take 1 tablet (20 mg total) by mouth daily. 30 tablet 1  . iron polysaccharides (NIFEREX) 150 MG capsule Take 1 capsule (150 mg total) by mouth daily. (Patient not taking: Reported on 12/19/2014) 60 capsule 1  . metoprolol succinate (TOPROL-XL) 50 MG 24 hr tablet Take 12.5 mg by mouth daily. Take with or immediately following a meal. Take 1/4 of a 50 mg tablet    . Omega-3 Fatty Acids (FISH OIL) 1000 MG CAPS Take 1,000 capsules by mouth daily.    Marland Kitchen oxybutynin (DITROPAN-XL) 5 MG 24 hr tablet Take 10 mg by mouth at bedtime.    . pantoprazole (PROTONIX) 40 MG tablet Take 1 tablet (40 mg total) by mouth daily. 30 tablet 0  . rivaroxaban (XARELTO) 20 MG TABS tablet Take 20 mg by  mouth daily with supper.    . tiotropium (SPIRIVA) 18 MCG inhalation capsule Place 1 capsule (18 mcg total) into inhaler and inhale daily. (Patient not taking: Reported on 02/20/2015) 30 capsule 12  . traMADol (ULTRAM) 50 MG tablet Take 1 tablet (50 mg total) by mouth every 6 (six) hours as needed for moderate pain (pain). (Patient taking differently: Take 50 mg by mouth at bedtime as needed for moderate pain (pain). ) 30 tablet 0  . VENTOLIN HFA 108 (90 BASE) MCG/ACT inhaler Inhale 2 puffs into the lungs 4 (four) times daily as needed for wheezing or shortness of breath.     . vitamin B-12 (CYANOCOBALAMIN) 500 MCG tablet Take 1,000 mcg by mouth daily.      No current facility-administered medications for this visit.    Functional Status:  In your present state of health, do you have any difficulty performing the following activities: 02/20/2015 12/06/2014  Hearing? [name]  Vision? N N  Difficulty concentrating or making decisions? N N  Walking or climbing stairs? N N  Dressing or bathing? N N  Doing errands, shopping? [name]  Preparing Food and eating ? N N  Using the Toilet? N N  In the past six months, have you accidently leaked urine? Y Y  Do you have problems with loss of  bowel control? N N  Managing your Medications? N N  Managing your Finances? N N  Housekeeping or managing your Housekeeping? [name]    Fall/Depression Screening:  PHQ 2/9 Scores 02/20/2015 11/19/2014 10/25/2014 10/07/2014 10/02/2014 09/20/2014 06/20/2014  PHQ - 2 Score 0 0 0 0 0 0 0    Assessment: [name] completed joint home visit with [name] on 02/20/15. Patient shares that she has been "doing well" and is not experiencing any discomfort or pain today. Patient reports that she is interested in contacting [facility] today in order to choose a PCS agency. Patient was informed two reasons why she has been denied in the past which are because of her location and due to lack of staff with certain agencies. [name] completed  calls to 9 different PCS agencies to question if they would be willing to accept [name] as patient. Out of the calls placed today, only [facility] and [facility] were willing to accept her. Some agencies denied patient due to her location, did not have a working number, did not answer or were only private pay and did not accept Medicaid. Patient was made aware that she could check back later with the other agencies that denied her in case they had expanded their serving area or gaining more working staff. [name] questioned if she would like to choose a PCS agency today or wait until she had more time to review PCS agencies with family. Patient wished for [name] to contact her son. [name] completed outreach to patient's son [name] [name] and was able to successfully reach him. [name] [name] shares that he would like to choose [facility] agency for patient and have [name] contact [name] for family to inform them of their decision. [name] [name] was made aware that if she wishes to change PCS agency after receiving their services that she can do so without difficulty and that [name] could assist with this process if needed. [name] then contacted [facility] and helped patient to inform agency of her decision in choosing [place]. [name] then completed psychosocial assessment with patient. Patient shared that she has sufficient support from neighbors, family and the church community. Patient reports that she went to a Thanksgiving supper with her church community last month and will be going to a Christmas supper this month which allows her to socialize. Patient reports that she has been taking medications as prescribed and that her health has improved. [name] appraised these achievements. [name] will complete outreach by 02/26/15 to follow up on PCS approval. PCS goal is ongoing.   Plan: [name] will complete outreach to patient by 02/26/15 to follow up on personal care service referral.  [name], BSW, MSW, [email or web address] Phone: [phone] Fax: [phone]

## 2015-02-20 NOTE — Patient Outreach (Signed)
[facility]) Care Management   02/20/2015  [name] [name] 04-27-1940 [id number]  [name] [name] is an 74 y.o. female  Subjective: "I need your help today, I want to stay with [facility] insurance , will you help me  call them"  Objective:   Review of Systems  Constitutional: Negative.   HENT: Negative.        Hard of hearing , wearing hearing aide  Respiratory: Positive for cough and sputum production. Negative for hemoptysis, shortness of breath and wheezing.        Clear phlegm  Cardiovascular: Positive for leg swelling. Negative for chest pain.  Genitourinary: Negative for dysuria and flank pain.  Musculoskeletal: Negative.  Negative for falls.  Skin: Negative.   Neurological: Negative.   Psychiatric/Behavioral: Negative for depression.  BP 140/80 mmHg  Pulse 74  Resp 20  SpO2 97%  Physical Exam  Constitutional: She is oriented to person, place, and time. She appears well-developed and well-nourished.  Cardiovascular: Normal rate and regular rhythm.   Respiratory: She has no wheezes.  Bilateral decreased breath sounds  Neurological: She is alert and oriented to person, place, and time.  Skin: Skin is warm and dry.    Current Medications:   Current Outpatient Prescriptions  Medication Sig Dispense Refill  . albuterol (PROVENTIL) (2.5 MG/3ML) 0.083% nebulizer solution Take 2.5 mg by nebulization every 6 (six) hours as needed for wheezing or shortness of breath.     Marland Kitchen atorvastatin (LIPITOR) 40 MG tablet Take 40 mg by mouth every morning.     . budesonide-formoterol (SYMBICORT) 160-4.5 MCG/ACT inhaler Inhale 2 puffs into the lungs 2 (two) times daily. 1 Inhaler 12  . digoxin (LANOXIN) 0.25 MG tablet Take 0.125 mg by mouth every morning.     . diltiazem (CARDIZEM CD) 240 MG 24 hr capsule Take 240 mg by mouth every morning.   1  . docusate sodium (COLACE) 100 MG capsule Take 100 mg by mouth daily as needed (constipation).     . Ferrous Sulfate (IRON) 325 (65  FE) MG TABS Take 325 mg by mouth daily.    . fluticasone (FLONASE) 50 MCG/ACT nasal spray Place 1 spray into both nostrils daily as needed for allergies or rhinitis.    . furosemide (LASIX) 20 MG tablet Take 1 tablet (20 mg total) by mouth daily. 30 tablet 1  . metoprolol succinate (TOPROL-XL) 50 MG 24 hr tablet Take 12.5 mg by mouth daily. Take with or immediately following a meal. Take 1/4 of a 50 mg tablet    . Omega-3 Fatty Acids (FISH OIL) 1000 MG CAPS Take 1,000 capsules by mouth daily.    Marland Kitchen oxybutynin (DITROPAN-XL) 5 MG 24 hr tablet Take 10 mg by mouth at bedtime.    . pantoprazole (PROTONIX) 40 MG tablet Take 1 tablet (40 mg total) by mouth daily. 30 tablet 0  . rivaroxaban (XARELTO) 20 MG TABS tablet Take 20 mg by mouth daily with supper.    . traMADol (ULTRAM) 50 MG tablet Take 1 tablet (50 mg total) by mouth every 6 (six) hours as needed for moderate pain (pain). (Patient taking differently: Take 50 mg by mouth at bedtime as needed for moderate pain (pain). ) 30 tablet 0  . VENTOLIN HFA 108 (90 BASE) MCG/ACT inhaler Inhale 2 puffs into the lungs 4 (four) times daily as needed for wheezing or shortness of breath.     . vitamin B-12 (CYANOCOBALAMIN) 500 MCG tablet Take 1,000 mcg by mouth daily.     Marland Kitchen  ciprofloxacin (CIPRO) 500 MG tablet Take 1 tablet (500 mg total) by mouth 2 (two) times daily. For 7 days (Patient not taking: Reported on 12/19/2014) 14 tablet 0  . iron polysaccharides (NIFEREX) 150 MG capsule Take 1 capsule (150 mg total) by mouth daily. (Patient not taking: Reported on 12/19/2014) 60 capsule 1  . tiotropium (SPIRIVA) 18 MCG inhalation capsule Place 1 capsule (18 mcg total) into inhaler and inhale daily. (Patient not taking: Reported on 02/20/2015) 30 capsule 12   No current facility-administered medications for this visit.    Functional Status:   In your present state of health, do you have any difficulty performing the following activities: 02/20/2015 12/06/2014  Hearing? [name]  Vision? N N  Difficulty concentrating or making decisions? N N  Walking or climbing stairs? N N  Dressing or bathing? N N  Doing errands, shopping? [name]  Preparing Food and eating ? N N  Using the Toilet? N N  In the past six months, have you accidently leaked urine? Y Y  Do you have problems with loss of bowel control? N N  Managing your Medications? N N  Managing your Finances? N N  Housekeeping or managing your Housekeeping? [name]    Fall/Depression Screening:    PHQ 2/9 Scores 02/20/2015 11/19/2014 10/25/2014 10/07/2014 10/02/2014 09/20/2014 06/20/2014  PHQ - 2 Score 0 0 0 0 0 0 0    Assessment:   [name] [name] expressed her greatest need is to get help with making sure that she is able to stay with [facility].[name] [name] has a letter from [facility] that reads she will be disenrolled from [facility] as of 03/15/15, because she has enrolled with another medicare advantage program, [name] [name] also has shown me a letter that she received from [place] her for enrolling, [name] [name] states that she has not enrolled in this program and wants to remain with [employer][name] [name] states that her son [name] request that I assist her with this, I have also received a telephone message from [facility] also requesting that I assist [name] [name]  with [employer].  COPD: Patient reports her breathing is in the green zone, she continues to wear oxygen per nasal cannula at 2 liters, 24 hours a day. [name] [name] denies episode of increase in COPD symptoms no flares .[name] [name] is able to state her action plan when in the yellow zone , she keeps her rescue inhaler with her at all times.  Falls: Patient denies having a fall and continues to use her rollator at all times,  Medication Adherence: [name] [name]  continues to report that she is taking her medications as prescribed, and requested assistance today with calling in her refill for Xarelto that she is out of, all other medication  present during review  Reviewed with [name] [name] the importance in notifying MD of symptoms in yellow zone and 911 for emergencies Patient request assistance with getting her hearing and hearing aid checked out after she gets her insurance problem resolved.   Plan:  .Assist [name] [name] with placing a call to [employer]( spoke with representative Letitia Neri, [phone])  to re-enroll per patient request, patient setup to have a home visit from a local [employer] agent to assist her in re-enrolling with [facility] as she now has. [name] [name] to receive  a phone call from [facility] 12/11 to 12/12, to arrange the home appointment for insurance enrollment, [name] [name] requested that her Son [name] [name] and his wife [name] [name] phone number be provided to  [employer] in case they are unable to get in touch with her so they can arrange the home appointment , due to her hearing difficulty over the phone.I will notify [name] [name] phone call from today and provide him the direct phone number to call [employer]  if he has questions.  I will follow up with [name] [name] in one week to determine if she was able to set up home appointment with [facility] visit for January scheduled.  [name] CM Care Plan Problem One        Most Recent Value   Care Plan Problem One  Adherence to COPD action plan   Role Documenting the Problem One  Care Management Coordinator   Care Plan for Problem One  Not Active   [name] Long Term Goal (31-90 days)  Patient will be able to follow the COPD action plan and notify MD for symptoms in the yellow Zones in the next 31 days   [name] Long Term Goal Start Date  05/24/14   [name] Long Term Goal Met Date  07/30/14   [name] CM Short Term Goal #1 (0-30 days)  Patient will be able to state the symptoms  in the yellow zone that she should call the MD about in the next 30 days   [name] CM Short Term Goal #1 Start Date  05/24/14   [facility] CM Short Term Goal #1 Met Date  06/20/14    [name] CM Short Term Goal #2 (0-30 days)  patient will review the COPD material within the next 30 days    [name] CM Short Term Goal #2 Start Date  05/24/14   [facility] CM Short Term Goal #2 Met Date  06/20/14    [facility] CM Care Plan Problem Two        Most Recent Value   Care Plan Problem Two  Fall Risk   Role Documenting the Problem Two  Care Management Coordinator   Care Plan for Problem Two  Not Active   [name] Long Term Goal (31-90) days  Patient will not experience a fall in the next 31 days   [name] Long Term Goal Start Date  05/24/14   [name] Long Term Goal Met Date  06/20/14   [name] CM Short Term Goal #1 (0-30 days)  Patient will use safety equipment consistenly within the next 30 days to decrease fall   [name] CM Short Term Goal #1 Start Date  05/24/14   [facility] CM Short Term Goal #1 Met Date   06/20/14   [name] CM Short Term Goal #2 (0-30 days)  Pt will discuss  with MD on next appointment about physical therapy for strengthen   [name] CM Short Term Goal #2 Start Date  05/24/14   [facility] CM Short Term Goal #2 Met Date  06/20/14   [name] CM Short Term Goal #3 (0-30 days)  Patient will report completing chair exercise at least 3 days a week as tolerated   [name] CM Short Term Goal #3 Start Date  01/09/15   [facility] CM Short Term Goal #3 Met Date  02/10/15    [facility] CM Care Plan Problem Three        Most Recent Value   Care Plan Problem Three  Frequent Hospital Admissions   Role Documenting the Problem [place] for Problem Three  Active   [name] Long Term Goal (31-90) days  Patient will not have a hospital admisssion in the next 37  days   [name] Long Term Goal Start Date  11/29/14   [facility] Long Term Goal Met Date  12/29/14 [goal not met, date restarted.]   [name] CM Short Term Goal #1 (0-30 days)  Patient will not experience  hospital admission in the next 30 days   [name] CM Short Term Goal #1 Start Date  11/11/14   [facility] CM Short Term Goal #1 Met Date  12/30/14 [goal not met. Readmitted on 9/7]   [name] CM Short  Term Goal #2 (0-30 days)  Patient will be verbalized home health nurse visit by 12/03/2014   [facility] CM Short Term Goal #2 Start Date  11/29/14   [name] CM Short Term Goal #2 Met Date  12/03/14   [name] CM Short Term Goal #3 (0-30 days)  Patient will be able to verbalize call primary MD for follow up appointment in 1 week   [facility]  CM Short Term Goal #3 Start Date  11/29/14   [facility] CM Short Term Goal #3 Met Date  12/19/14     Joylene Draft, RN, [place] Management [phone]- Mobile [phone]- [place]   .

## 2015-02-26 ENCOUNTER — Other Ambulatory Visit: Payer: Self-pay | Admitting: Licensed Clinical Social Worker

## 2015-02-26 DIAGNOSIS — J449 Chronic obstructive pulmonary disease, unspecified: Secondary | ICD-10-CM | POA: Diagnosis not present

## 2015-02-26 NOTE — Patient Outreach (Signed)
[facility]) Care Management  02/26/2015  WESTLEY LEFLER 1940/09/18 [id number]  Assessment-[name] contacted [facility] on 02/26/15 and verified that patient has been accepted by [facility]. [name] contacted patient by phone and was able to successfully reach her. [name] informed patient that she has been accepted by [facility] and informed her that they will contact her in the next 1-3 business days and will also send a letter in the mail to her. Patient expressed understanding that agency will contact her by both phone and mail. [name] contacted patient's son to inform him of these updates and provided him with the home health agency's contact number in any case that he would need to contact them.  Plan-[name] will make outreach to patient next week to follow up on PCS status.  [name], BSW, MSW, [email or web address] Phone: [phone] Fax: [phone]

## 2015-02-27 ENCOUNTER — Other Ambulatory Visit: Payer: Self-pay | Admitting: *Deleted

## 2015-02-27 NOTE — Patient Outreach (Signed)
[facility]) Care Management  02/27/2015  [name] [name] December 26, 1940 [id number]   Telephone call to [name] to follow up whether she was able to get appointment to sign back up with [facility] per her request. [name] states that [employer] came to her home on Friday, her son [name] was present and she was able to sign back up for [facility] for 2017. Mrs.Erekson states that the representative told her that the [facility] that she received the letter from would be automatically cancelled and for her not to worry. [name] expressed being grateful.  Will follow up with patient with home visit in January.   Joylene Draft, RN, [place] Management [phone]- Mobile [phone]- Toll Free Main Office

## 2015-03-03 ENCOUNTER — Other Ambulatory Visit: Payer: Self-pay | Admitting: Licensed Clinical Social Worker

## 2015-03-03 NOTE — Patient Outreach (Signed)
[facility]) Care Management  03/03/2015  [name] [name] 07-Sep-1940 [id number]   Assessment- [name] completed outreach to patient to follow up on PCS status. Patient answered and stated that she received a letter in the mail from [place] but lost the letter and was not able to contact them. [name] contacted [place] while still on the phone with patient. [facility] shares that they have been in contact with patient's son and that they have a visit schedule at patient's home for 03/04/15 at 10:00 am. [name] provided updates to patient.   Plan-[name] will contact patient within two weeks to gather PCS status.  [name], BSW, MSW, [email or web address] Phone: [phone] Fax: [phone]

## 2015-03-11 DIAGNOSIS — R32 Unspecified urinary incontinence: Secondary | ICD-10-CM | POA: Diagnosis not present

## 2015-03-12 DIAGNOSIS — R32 Unspecified urinary incontinence: Secondary | ICD-10-CM | POA: Diagnosis not present

## 2015-03-13 DIAGNOSIS — R32 Unspecified urinary incontinence: Secondary | ICD-10-CM | POA: Diagnosis not present

## 2015-03-14 DIAGNOSIS — R32 Unspecified urinary incontinence: Secondary | ICD-10-CM | POA: Diagnosis not present

## 2015-03-17 DIAGNOSIS — J449 Chronic obstructive pulmonary disease, unspecified: Secondary | ICD-10-CM | POA: Diagnosis not present

## 2015-03-17 DIAGNOSIS — R32 Unspecified urinary incontinence: Secondary | ICD-10-CM | POA: Diagnosis not present

## 2015-03-18 DIAGNOSIS — R32 Unspecified urinary incontinence: Secondary | ICD-10-CM | POA: Diagnosis not present

## 2015-03-19 DIAGNOSIS — R32 Unspecified urinary incontinence: Secondary | ICD-10-CM | POA: Diagnosis not present

## 2015-03-20 DIAGNOSIS — R32 Unspecified urinary incontinence: Secondary | ICD-10-CM | POA: Diagnosis not present

## 2015-03-21 DIAGNOSIS — R32 Unspecified urinary incontinence: Secondary | ICD-10-CM | POA: Diagnosis not present

## 2015-03-26 DIAGNOSIS — R32 Unspecified urinary incontinence: Secondary | ICD-10-CM | POA: Diagnosis not present

## 2015-03-27 ENCOUNTER — Other Ambulatory Visit: Payer: Self-pay | Admitting: *Deleted

## 2015-03-27 DIAGNOSIS — R32 Unspecified urinary incontinence: Secondary | ICD-10-CM | POA: Diagnosis not present

## 2015-03-27 NOTE — Patient Outreach (Signed)
[name] [name]) Care Management   03/27/2015  [name] [name] May 17, 1940 [id number]  [name] [name]  Subjective: " I have had a few problems, I have several medication that I am out of and need refills also I have had a problem with a headache,  Its is better today but I have called [name] [name] and have made an appointment for 1/23. I also have some good news, I have a home health aide with [facility] 5 days a week for about a 1 1/2 hours that has really helped.  Objective:   Review of Systems  Constitutional: Negative.  Negative for fever.  HENT:       Hard of hearing, complaint of headache off and on for 1 week.  Respiratory: Positive for sputum production. Negative for shortness of breath.        Occasional clear sputum production  Cardiovascular: Negative.  Negative for leg swelling.  Gastrointestinal: Negative for heartburn and nausea.  Musculoskeletal: Negative for falls.  Skin: Negative.   Neurological: Positive for headaches. Negative for dizziness and weakness.  Psychiatric/Behavioral: Negative for depression.   BP 144/80 mmHg  Pulse 72  Resp 20  SpO2 99% Physical Exam  Constitutional: She is oriented to person, place, and time. She appears well-developed and well-nourished.  Cardiovascular: Normal rate and normal heart sounds.   Pulses:      Dorsalis pedis pulses are 2+ on the right side, and 2+ on the left side.  Respiratory: No respiratory distress. She has no wheezes.  Decreased breath sounds, no wheezing  GI: Soft.  Neurological: She is oriented to person, place, and time.  Skin: Skin is warm and dry.  Psychiatric: She has a normal mood and affect. Her behavior is normal. Judgment and thought content normal.    Current Medications:   Current Outpatient Prescriptions  Medication Sig Dispense Refill  . albuterol (PROVENTIL) (2.5 MG/3ML) 0.083% nebulizer solution Take 2.5 mg by nebulization every 6 (six) hours as  needed for wheezing or shortness of breath.     Marland Kitchen atorvastatin (LIPITOR) 40 MG tablet Take 40 mg by mouth every morning.     . budesonide-formoterol (SYMBICORT) 160-4.5 MCG/ACT inhaler Inhale 2 puffs into the lungs 2 (two) times daily. 1 Inhaler 12  . digoxin (LANOXIN) 0.25 MG tablet Take 0.125 mg by mouth every morning.     . diltiazem (CARDIZEM CD) 240 MG 24 hr capsule Take 240 mg by mouth every morning.   1  . docusate sodium (COLACE) 100 MG capsule Take 100 mg by mouth daily as needed (constipation).     . Ferrous Sulfate (IRON) 325 (65 FE) MG TABS Take 325 mg by mouth daily.    . fluticasone (FLONASE) 50 MCG/ACT nasal spray Place 1 spray into both nostrils daily as needed for allergies or rhinitis.    . furosemide (LASIX) 20 MG tablet Take 1 tablet (20 mg total) by mouth daily. 30 tablet 1  . metoprolol succinate (TOPROL-XL) 50 MG 24 hr tablet Take 12.5 mg by mouth daily. Take with or immediately following a meal. Take 1/4 of a 50 mg tablet    . Omega-3 Fatty Acids (FISH OIL) 1000 MG CAPS Take 1,000 capsules by mouth daily.    Marland Kitchen oxybutynin (DITROPAN-XL) 5 MG 24 hr tablet Take 10 mg by mouth at bedtime.    . pantoprazole (PROTONIX) 40 MG tablet Take 1 tablet (40 mg total) by mouth daily. 30 tablet 0  .  rivaroxaban (XARELTO) 20 MG TABS tablet Take 20 mg by mouth daily with supper.    . traMADol (ULTRAM) 50 MG tablet Take 1 tablet (50 mg total) by mouth every 6 (six) hours as needed for moderate pain (pain). (Patient taking differently: Take 50 mg by mouth at bedtime as needed for moderate pain (pain). ) 30 tablet 0  . VENTOLIN HFA 108 (90 BASE) MCG/ACT inhaler Inhale 2 puffs into the lungs 4 (four) times daily as needed for wheezing or shortness of breath.     . vitamin B-12 (CYANOCOBALAMIN) 500 MCG tablet Take 1,000 mcg by mouth daily.     . ciprofloxacin (CIPRO) 500 MG tablet Take 1 tablet (500 mg total) by mouth 2 (two) times daily. For 7 days (Patient not taking: Reported on 12/19/2014) 14  tablet 0  . iron polysaccharides (NIFEREX) 150 MG capsule Take 1 capsule (150 mg total) by mouth daily. (Patient not taking: Reported on 12/19/2014) 60 capsule 1  . tiotropium (SPIRIVA) 18 MCG inhalation capsule Place 1 capsule (18 mcg total) into inhaler and inhale daily. (Patient not taking: Reported on 02/20/2015) 30 capsule 12   No current facility-administered medications for this visit.    Functional Status:   In your present state of health, do you have any difficulty performing the following activities: 02/20/2015 12/06/2014  Hearing? [name] [name]  Vision? N N  Difficulty concentrating or making decisions? N N  Walking or climbing stairs? N N  Dressing or bathing? N N  Doing errands, shopping? [name] [name]  Preparing Food and eating ? N N  Using the Toilet? N N  In the past six months, have you accidently leaked urine? Y Y  Do you have problems with loss of bowel control? N N  Managing your Medications? N N  Managing your Finances? N N  Housekeeping or managing your Housekeeping? [name] [name]   Fall Risk  02/20/2015 11/19/2014 10/07/2014 10/07/2014 10/02/2014  Falls in the past year? No No No No No  Risk for fall due to : Impaired balance/gait;History of fall(s) Impaired balance/gait;History of fall(s);Impaired mobility History of fall(s);Impaired balance/gait History of fall(s) History of fall(s);Impaired balance/gait  Risk for fall due to (comments): - - - patient states its been 3 years, using rollator -   Fall/Depression Screening:    PHQ 2/9 Scores 02/20/2015 02/20/2015 11/19/2014 10/25/2014 10/07/2014 10/02/2014 09/20/2014  PHQ - 2 Score 0 0 0 0 0 0 0    Assessment:   COPD-  No copd flares, patient continues to use her daily inhaler and nebulizer treatments. Reports that her breathing is in the green zone on today. Continues on continuous oxygen at 2 liters  Falls: No falls continues to use her walker at all times, and able to walk at outside along sidewalk as weather permits. Patient also she has been able to  take a shower at least 2 times a week since she has her home health aide.  Medications [name] [name] reports that she is out of several medications at least a week, she states that she thought her son had called [name].Medication that she has been out of include: xarelto,Atrovastatin,Diltiazem,Digoxin,Oxybutrin,Tramadol, I have called the pharmacy to request refill on medications and delivery of medications on today. Patient reports also that she has not been taking her lasix on a daily basis as prescribed. Discussed importance of taking medications daily to prevent further issues.  Mrs.Landers plans to use [employer] program for transportation to MD appointment. No other concerns voiced, encouraged patient to  call [place] office if her headache gets worse prior to her appointment.   Plan I will notify [place] office of concern related to patient being without medication for a week, and in need of refills on some medication,patient interested in getting medication in 90 day supply from pharmacy, I spoke with Dorian Pod at [facility] in [place], to give her the list of medications that the patient needs a refill on. Will notify [place] of medications that patient needs refill on, and needs delivery on today. I will follow up with patient on 1/13 to make sure she has received her prescriptions, and will notify RN/MD of issues related to medications sooner. Case manager will visit patient in home on February 9   [name] CM Care Plan Problem One        Most Recent Value   Care Plan Problem One  Adherence to COPD action plan   Role Documenting the Problem One  Care Management Coordinator   Care Plan for Problem One  Not Active   [name] Long Term Goal (31-90 days)  Patient will be able to follow the COPD action plan and notify MD for symptoms in the yellow Zones in the next 31 days   [name] Long Term Goal Start Date  05/24/14   [name] Long Term Goal Met Date  07/30/14   [name] CM  Short Term Goal #1 (0-30 days)  Patient will be able to state the symptoms  in the yellow zone that she should call the MD about in the next 30 days   [name] CM Short Term Goal #1 Start Date  05/24/14   [facility] CM Short Term Goal #1 Met Date  06/20/14   [name] CM Short Term Goal #2 (0-30 days)  patient will review the COPD material within the next 30 days    [name] CM Short Term Goal #2 Start Date  05/24/14   [name] CM Short Term Goal #2 Met Date  06/20/14    [facility] CM Care Plan Problem Two        Most Recent Value   Care Plan Problem Two  Fall Risk   Role Documenting the Problem Two  Care Management Coordinator   Care Plan for Problem Two  Not Active   [name] Long Term Goal (31-90) days  Patient will not experience a fall in the next 31 days   [name] Long Term Goal Start Date  05/24/14   [name] Long Term Goal Met Date  06/20/14   [name] CM Short Term Goal #1 (0-30 days)  Patient will use safety equipment consistenly within the next 30 days to decrease fall   [name] CM Short Term Goal #1 Start Date  05/24/14   [name] CM Short Term Goal #1 Met Date   06/20/14   [name] CM Short Term Goal #2 (0-30 days)  Pt will discuss  with MD on next appointment about physical therapy for strengthen   [name] CM Short Term Goal #2 Start Date  05/24/14   [facility] CM Short Term Goal #2 Met Date  06/20/14   [name] CM Short Term Goal #3 (0-30 days)  Patient will report completing chair exercise at least 3 days a week as tolerated   [name] CM Short Term Goal #3 Start Date  01/09/15   [facility] CM Short Term Goal #3 Met Date  02/10/15    [name] CM Care Plan Problem Three        Most Recent Value   Care Plan Problem Three  Frequent [name] Admissions   Role Documenting the Problem Three  Care Management [place] for Problem Three  Active   [name] Long Term Goal (31-90) days  Patient will not have a [name] admisssion in the next 31 days   [name] Long Term Goal Start Date  11/29/14   [name] Long Term Goal Met Date  12/29/14 [goal not met, date restarted.]   [name]  CM Short Term Goal #1 (0-30 days)  Patient will not experience  [name] admission in the next 30 days   [name] CM Short Term Goal #1 Start Date  11/11/14   [name] CM Short Term Goal #1 Met Date  12/30/14 [goal not met. Readmitted on 9/7]   [name] CM Short Term Goal #2 (0-30 days)  Patient will be verbalized home health nurse visit by 12/03/2014   [facility] CM Short Term Goal #2 Start Date  11/29/14   [name] CM Short Term Goal #2 Met Date  12/03/14   [name] CM Short Term Goal #3 (0-30 days)  Patient will be able to verbalize call primary MD for follow up appointment in 1 week   [name]  CM Short Term Goal #3 Start Date  11/29/14   [name] CM Short Term Goal #3 Met Date  12/19/14   [name] CM Short Term Goal #4 (0-30 days)  Patient will be able to resolve concern with getting medication refills monthly in the next 30 days   [name] CM Short Term Goal #4 Start Date  03/27/15   Interventions for Short Term Goal #4  Notify MD office of concern and ask if medications can be refilled on a 90 supply, MD  will address at patient 1/23  appointment, Discuss with patient son option of automatic refill      Joylene Draft, RN, [place] Management [phone]- Mobile [phone]- [place]

## 2015-03-28 DIAGNOSIS — R32 Unspecified urinary incontinence: Secondary | ICD-10-CM | POA: Diagnosis not present

## 2015-03-29 DIAGNOSIS — J449 Chronic obstructive pulmonary disease, unspecified: Secondary | ICD-10-CM | POA: Diagnosis not present

## 2015-03-31 ENCOUNTER — Other Ambulatory Visit: Payer: Self-pay | Admitting: Licensed Clinical Social Worker

## 2015-03-31 DIAGNOSIS — R32 Unspecified urinary incontinence: Secondary | ICD-10-CM | POA: Diagnosis not present

## 2015-03-31 NOTE — Patient Outreach (Signed)
[facility]) Care Management  03/31/2015  DENALI SHARMA [birth year] [id number]   Assessment-[name] completed outreach to patient on 03/31/15. Patient answered and provided HIPPA verifications. Patient reports that she is now receiving personal care services with [facility]. Patient receives services 1 1/2 hours per day, five days per week. Patient reports that she has built a strong rapport with her home health aide. Patient reports that she helps her with taking a bath, taking out the trash, cleaning dishes, mopping floors and assisting her with anything that she needs. Patient also states that once home health aide is off work, she will take the time to take her to the grocery store. Patient reports that she is very happy to have such a caring home health aide. Patient has no further social work needs. Long term goal of gaining personal care services has now been met. [name] will complete social work discharge at this time. Patient expresses understanding. Patient was encouraged to contact [name] if she had any question in the future.  Plan-[name] will complete discharge at this time. [name] will notify PCP of discharge and that patient now receives PCS. [name] will update RNCM.  [name], BSW, MSW, [email or web address] Phone: [phone] Fax: [phone]

## 2015-04-01 DIAGNOSIS — R32 Unspecified urinary incontinence: Secondary | ICD-10-CM | POA: Diagnosis not present

## 2015-04-02 DIAGNOSIS — R32 Unspecified urinary incontinence: Secondary | ICD-10-CM | POA: Diagnosis not present

## 2015-04-03 DIAGNOSIS — R32 Unspecified urinary incontinence: Secondary | ICD-10-CM | POA: Diagnosis not present

## 2015-04-04 DIAGNOSIS — R32 Unspecified urinary incontinence: Secondary | ICD-10-CM | POA: Diagnosis not present

## 2015-04-07 DIAGNOSIS — Z23 Encounter for immunization: Secondary | ICD-10-CM | POA: Diagnosis not present

## 2015-04-07 DIAGNOSIS — E7800 Pure hypercholesterolemia, unspecified: Secondary | ICD-10-CM | POA: Diagnosis not present

## 2015-04-07 DIAGNOSIS — R609 Edema, unspecified: Secondary | ICD-10-CM | POA: Diagnosis not present

## 2015-04-07 DIAGNOSIS — Z0000 Encounter for general adult medical examination without abnormal findings: Secondary | ICD-10-CM | POA: Diagnosis not present

## 2015-04-07 DIAGNOSIS — Z79899 Other long term (current) drug therapy: Secondary | ICD-10-CM | POA: Diagnosis not present

## 2015-04-07 DIAGNOSIS — R7303 Prediabetes: Secondary | ICD-10-CM | POA: Diagnosis not present

## 2015-04-07 DIAGNOSIS — R32 Unspecified urinary incontinence: Secondary | ICD-10-CM | POA: Diagnosis not present

## 2015-04-07 DIAGNOSIS — E538 Deficiency of other specified B group vitamins: Secondary | ICD-10-CM | POA: Diagnosis not present

## 2015-04-07 DIAGNOSIS — I470 Re-entry ventricular arrhythmia: Secondary | ICD-10-CM | POA: Diagnosis not present

## 2015-04-07 DIAGNOSIS — I10 Essential (primary) hypertension: Secondary | ICD-10-CM | POA: Diagnosis not present

## 2015-04-07 DIAGNOSIS — Z6823 Body mass index (BMI) 23.0-23.9, adult: Secondary | ICD-10-CM | POA: Diagnosis not present

## 2015-04-08 DIAGNOSIS — R32 Unspecified urinary incontinence: Secondary | ICD-10-CM | POA: Diagnosis not present

## 2015-04-09 DIAGNOSIS — R32 Unspecified urinary incontinence: Secondary | ICD-10-CM | POA: Diagnosis not present

## 2015-04-10 DIAGNOSIS — R32 Unspecified urinary incontinence: Secondary | ICD-10-CM | POA: Diagnosis not present

## 2015-04-14 DIAGNOSIS — R32 Unspecified urinary incontinence: Secondary | ICD-10-CM | POA: Diagnosis not present

## 2015-04-15 DIAGNOSIS — R32 Unspecified urinary incontinence: Secondary | ICD-10-CM | POA: Diagnosis not present

## 2015-04-16 DIAGNOSIS — R32 Unspecified urinary incontinence: Secondary | ICD-10-CM | POA: Diagnosis not present

## 2015-04-17 DIAGNOSIS — R32 Unspecified urinary incontinence: Secondary | ICD-10-CM | POA: Diagnosis not present

## 2015-04-17 DIAGNOSIS — J449 Chronic obstructive pulmonary disease, unspecified: Secondary | ICD-10-CM | POA: Diagnosis not present

## 2015-04-18 DIAGNOSIS — R32 Unspecified urinary incontinence: Secondary | ICD-10-CM | POA: Diagnosis not present

## 2015-04-21 DIAGNOSIS — R32 Unspecified urinary incontinence: Secondary | ICD-10-CM | POA: Diagnosis not present

## 2015-04-22 DIAGNOSIS — R32 Unspecified urinary incontinence: Secondary | ICD-10-CM | POA: Diagnosis not present

## 2015-04-23 DIAGNOSIS — R32 Unspecified urinary incontinence: Secondary | ICD-10-CM | POA: Diagnosis not present

## 2015-04-24 ENCOUNTER — Other Ambulatory Visit: Payer: Self-pay | Admitting: *Deleted

## 2015-04-24 DIAGNOSIS — R32 Unspecified urinary incontinence: Secondary | ICD-10-CM | POA: Diagnosis not present

## 2015-04-24 NOTE — Patient Outreach (Signed)
[facility]) Care Management   04/24/2015  [name] [name] 30-May-1940 [id number]  [name] [name] is an 75 y.o. female  Subjective: " I have been doing good " Patient discussed her recent appointment with her PCP, and that she received the shingles vaccines, also the doctor found something in my blood and she has another office visit on 2/23.  Objective:   Review of Systems  Constitutional: Negative.   HENT: Negative.        Hard of hearing ,has hearing aid  Eyes: Negative.   Respiratory: Negative.   Cardiovascular: Positive for leg swelling. Negative for chest pain.       Slight lower extremity bilateral edema  Gastrointestinal: Negative.   Genitourinary: Negative.   Musculoskeletal: Negative.  Negative for falls.  Skin: Negative.   Neurological: Negative.   Endo/Heme/Allergies: Negative.   Psychiatric/Behavioral: Negative for depression.   BP 138/80 mmHg  Pulse 60  Resp 20  SpO2 98% Physical Exam  Constitutional: She is oriented to person, place, and time. She appears well-developed and well-nourished.  Cardiovascular: Normal rate and normal heart sounds.   Respiratory: Effort normal and breath sounds normal.  GI: Soft.  Musculoskeletal: Normal range of motion.  Uses a rollator at all times  Neurological: She is alert and oriented to person, place, and time.  Skin: Skin is warm and dry.  Psychiatric: She has a normal mood and affect. Her behavior is normal. Judgment and thought content normal.    Current Medications:   Current Outpatient Prescriptions  Medication Sig Dispense Refill  . albuterol (PROVENTIL) (2.5 MG/3ML) 0.083% nebulizer solution Take 2.5 mg by nebulization every 6 (six) hours as needed for wheezing or shortness of breath.     Marland Kitchen atorvastatin (LIPITOR) 40 MG tablet Take 40 mg by mouth every morning.     . budesonide-formoterol (SYMBICORT) 160-4.5 MCG/ACT inhaler Inhale 2 puffs into the lungs 2 (two) times daily. 1 Inhaler 12  .  ciprofloxacin (CIPRO) 500 MG tablet Take 1 tablet (500 mg total) by mouth 2 (two) times daily. For 7 days (Patient not taking: Reported on 12/19/2014) 14 tablet 0  . digoxin (LANOXIN) 0.25 MG tablet Take 0.125 mg by mouth every morning.     . diltiazem (CARDIZEM CD) 240 MG 24 hr capsule Take 240 mg by mouth every morning.   1  . docusate sodium (COLACE) 100 MG capsule Take 100 mg by mouth daily as needed (constipation).     . Ferrous Sulfate (IRON) 325 (65 FE) MG TABS Take 325 mg by mouth daily.    . fluticasone (FLONASE) 50 MCG/ACT nasal spray Place 1 spray into both nostrils daily as needed for allergies or rhinitis.    . furosemide (LASIX) 20 MG tablet Take 1 tablet (20 mg total) by mouth daily. 30 tablet 1  . iron polysaccharides (NIFEREX) 150 MG capsule Take 1 capsule (150 mg total) by mouth daily. (Patient not taking: Reported on 12/19/2014) 60 capsule 1  . metoprolol succinate (TOPROL-XL) 50 MG 24 hr tablet Take 12.5 mg by mouth daily. Take with or immediately following a meal. Take 1/4 of a 50 mg tablet    . Omega-3 Fatty Acids (FISH OIL) 1000 MG CAPS Take 1,000 capsules by mouth daily.    Marland Kitchen oxybutynin (DITROPAN-XL) 5 MG 24 hr tablet Take 10 mg by mouth at bedtime.    . pantoprazole (PROTONIX) 40 MG tablet Take 1 tablet (40 mg total) by mouth daily. 30 tablet 0  . rivaroxaban (XARELTO)  20 MG TABS tablet Take 20 mg by mouth daily with supper.    . tiotropium (SPIRIVA) 18 MCG inhalation capsule Place 1 capsule (18 mcg total) into inhaler and inhale daily. (Patient not taking: Reported on 02/20/2015) 30 capsule 12  . traMADol (ULTRAM) 50 MG tablet Take 1 tablet (50 mg total) by mouth every 6 (six) hours as needed for moderate pain (pain). (Patient taking differently: Take 50 mg by mouth at bedtime as needed for moderate pain (pain). ) 30 tablet 0  . VENTOLIN HFA 108 (90 BASE) MCG/ACT inhaler Inhale 2 puffs into the lungs 4 (four) times daily as needed for wheezing or shortness of breath.     .  vitamin B-12 (CYANOCOBALAMIN) 500 MCG tablet Take 1,000 mcg by mouth daily.      No current facility-administered medications for this visit.    Functional Status:   In your present state of health, do you have any difficulty performing the following activities: 02/20/2015 12/06/2014  Hearing? [name] [name]  Vision? N N  Difficulty concentrating or making decisions? N N  Walking or climbing stairs? N N  Dressing or bathing? N N  Doing errands, shopping? [name] [name]  Preparing Food and eating ? N N  Using the Toilet? N N  In the past six months, have you accidently leaked urine? Y Y  Do you have problems with loss of bowel control? N N  Managing your Medications? N N  Managing your Finances? N N  Housekeeping or managing your Housekeeping? [name] [name]    Fall/Depression Screening:    PHQ 2/9 Scores 03/31/2015 02/20/2015 02/20/2015 11/19/2014 10/25/2014 10/07/2014 10/02/2014  PHQ - 2 Score 0 0 0 0 0 0 0   Fall Risk  02/20/2015 11/19/2014 10/07/2014 10/07/2014 10/02/2014  Falls in the past year? _0   Risk for fall due to : Impaired balance/gait;History of fall(s) Impaired balance/gait;History of fall(s);Impaired mobility History of fall(s);Impaired balance/gait History of fall(s) History of fall(s);Impaired balance/gait  Risk for fall due to (comments): - - - patient states its been 3 years, using rollator -    Assessment:  Routine home visit, home need, patient has personal care assistant that comes 5 days a week for 2 hours a day.  COPD Patient reports breathing in the green zone, denies having any "breathing spells".recently. Patient reports taking medication as prescribed. Continues to wear oxygen at 2 liters at all times.Reviewed yellow zone symptoms with patient she needed cueing on yellow zone symptoms and action plan, " states I have to look at the paper".  Falls No falls, continues to use rollator at all times, continues to take daily walks outside as weather permits, and states she always takes her  rescue inhaler .   Medication Adherence Mrs.Greek reports taking all medication as prescribed, she informed me that she has now [facility] to [facility], and then she has assistance with picking up medication. Patient requested that I assist her with calling in needed refills on medication, and I placed a call to [facility] office to notify them of patient needing refill on Albuterol inhaler to [facility] instead of [place] and medication should be ready for pick up on Friday, Mrs.Bhat made aware. Mrs.Rawl asked about automatic refills on medications, per representative at [facility], none of her medications qualified per insurance for automatic refills, patient reports that this will help her from having to call in for refill each month.    Plan:  Patient will notify  MD for increased shortness, cough, yellow zone symptoms and 911 for emergencies, red zone symptoms. i will discuss with [facility] pharmacy regarding options for patient that request medication on automatic refill and update patient  I will schedule  RNCM home visit on 3/9,    [facility] CM Care Plan Problem One        Most Recent Value   Care Plan Problem One  Adherence to COPD action plan   Role Documenting the Problem One  Care Management Coordinator   Care Plan for Problem One  Not Active   [name] Long Term Goal (31-90 days)  Patient will be able to follow the COPD action plan and notify MD for symptoms in the yellow Zones in the next 31 days   [name] Long Term Goal Start Date  05/24/14   [name] Long Term Goal Met Date  07/30/14   [name] CM Short Term Goal #1 (0-30 days)  Patient will be able to state the symptoms  in the yellow zone that she should call the MD about in the next 30 days   [name] CM Short Term Goal #1 Start Date  05/24/14   [facility] CM Short Term Goal #1 Met Date  06/20/14   [name] CM Short Term Goal #2 (0-30 days)  patient will review the COPD material within the next 30 days    [name] CM Short Term Goal #2  Start Date  05/24/14   [facility] CM Short Term Goal #2 Met Date  06/20/14    [facility] CM Care Plan Problem Two        Most Recent Value   Care Plan Problem Two  Fall Risk   Role Documenting the Problem Two  Care Management Coordinator   Care Plan for Problem Two  Not Active   [name] Long Term Goal (31-90) days  Patient will not experience a fall in the next 31 days   [name] Long Term Goal Start Date  05/24/14   [name] Long Term Goal Met Date  06/20/14   [name] CM Short Term Goal #1 (0-30 days)  Patient will use safety equipment consistenly within the next 30 days to decrease fall   [name] CM Short Term Goal #1 Start Date  05/24/14   [facility] CM Short Term Goal #1 Met Date   06/20/14   [name] CM Short Term Goal #2 (0-30 days)  Pt will discuss  with MD on next appointment about physical therapy for strengthen   [facility] CM Short Term Goal #2 Start Date  05/24/14   [facility] CM Short Term Goal #2 Met Date  06/20/14   [name] CM Short Term Goal #3 (0-30 days)  Patient will report completing chair exercise at least 3 days a week as tolerated   [name] CM Short Term Goal #3 Start Date  01/09/15   [facility] CM Short Term Goal #3 Met Date  02/10/15    [facility] CM Care Plan Problem Three        Most Recent Value   Care Plan Problem Three  Frequent Hospital Admissions   Role Documenting the Problem [place] for Problem Three  Active   [name] Long Term Goal (31-90) days  Patient will not have a hospital admisssion in the next 31 days   [place] Term Goal Start Date  11/29/14   [place] Term Goal Met Date  12/29/14 [goal not met, date restarted.]   [name] CM Short Term Goal #1 (0-30 days)  Patient will not experience  hospital admission in the next 30 days   [name] CM Short Term Goal #1 Start Date  11/11/14   [facility] CM Short Term Goal #1 Met Date  12/30/14 [goal not met. Readmitted on 9/7]   [name] CM Short Term Goal #2 (0-30 days)  Patient will be verbalized home health nurse visit by 12/03/2014   [facility] CM Short Term Goal #2 Start Date   11/29/14   [name] CM Short Term Goal #2 Met Date  12/03/14   [name] CM Short Term Goal #3 (0-30 days)  Patient will be able to verbalize call primary MD for follow up appointment in 1 week   [name]  CM Short Term Goal #3 Start Date  11/29/14   [facility] CM Short Term Goal #3 Met Date  12/19/14   [name] CM Short Term Goal #4 (0-30 days)  Patient will be able to resolve concern with getting medication refills monthly in the next 30 days   [name] CM Short Term Goal #4 Start Date  04/24/15   Interventions for Short Term Goal #4  RNCM will ask, patient pharmacy where she gets refills, regardining automatic refill, also I  will collaborate with [facility] pharmacist      Joylene Draft, RN, [place] Management [phone]- Mobile [phone]- [place]

## 2015-04-25 DIAGNOSIS — R32 Unspecified urinary incontinence: Secondary | ICD-10-CM | POA: Diagnosis not present

## 2015-04-28 DIAGNOSIS — R32 Unspecified urinary incontinence: Secondary | ICD-10-CM | POA: Diagnosis not present

## 2015-04-29 DIAGNOSIS — R32 Unspecified urinary incontinence: Secondary | ICD-10-CM | POA: Diagnosis not present

## 2015-04-29 DIAGNOSIS — J449 Chronic obstructive pulmonary disease, unspecified: Secondary | ICD-10-CM | POA: Diagnosis not present

## 2015-04-30 DIAGNOSIS — R32 Unspecified urinary incontinence: Secondary | ICD-10-CM | POA: Diagnosis not present

## 2015-05-01 DIAGNOSIS — R32 Unspecified urinary incontinence: Secondary | ICD-10-CM | POA: Diagnosis not present

## 2015-05-02 DIAGNOSIS — R32 Unspecified urinary incontinence: Secondary | ICD-10-CM | POA: Diagnosis not present

## 2015-05-05 DIAGNOSIS — R32 Unspecified urinary incontinence: Secondary | ICD-10-CM | POA: Diagnosis not present

## 2015-05-06 DIAGNOSIS — R32 Unspecified urinary incontinence: Secondary | ICD-10-CM | POA: Diagnosis not present

## 2015-05-07 DIAGNOSIS — R32 Unspecified urinary incontinence: Secondary | ICD-10-CM | POA: Diagnosis not present

## 2015-05-08 DIAGNOSIS — R7889 Finding of other specified substances, not normally found in blood: Secondary | ICD-10-CM | POA: Diagnosis not present

## 2015-05-09 DIAGNOSIS — R32 Unspecified urinary incontinence: Secondary | ICD-10-CM | POA: Diagnosis not present

## 2015-05-15 DIAGNOSIS — J449 Chronic obstructive pulmonary disease, unspecified: Secondary | ICD-10-CM | POA: Diagnosis not present

## 2015-05-16 ENCOUNTER — Telehealth: Payer: Self-pay

## 2015-05-16 DIAGNOSIS — Z139 Encounter for screening, unspecified: Secondary | ICD-10-CM

## 2015-05-16 NOTE — Telephone Encounter (Signed)
The personal aide for Ms. Gelpi, told me after I visited her that she has found pills on the floor and that she was 8 days without digoxin for unable to refill RX. The original date filled was 1/24 and couldn't refill until 2/16. I put a call into Vicente Males a the CAPP's program at [facility] to see where the client was on the waiting list and to see if we can get medication management even though Ms. Monier wants to do her own and thinks she is fine. Awaiting a call back [place]

## 2015-05-16 NOTE — Congregational Nurse Program (Signed)
Congregational Nurse Program Note  Date of Encounter: 05/16/2015  Past Medical History: Past Medical History  Diagnosis Date  . Hypertension   . Shortness of breath   . Asthma   . COPD (chronic obstructive pulmonary disease) (HCC)     emphysema    . GERD (gastroesophageal reflux disease)   . Blood dyscrasia     hx blood clotts  . Hyperlipidemia   . Anemia   . Pneumothorax on right 09/2014  . On home oxygen therapy     "3L; 24/7" (11/21/2014)  . Pneumonia     hx pneumonia   . Chronic bronchitis ([place])   . Pulmonary embolism ([place])   . History of blood transfusion X 2    "related to blood thinner I was taking"  . [id number])     "a few times/year" (11/21/2014)  . Migraine     "weekly probably" (11/21/2014)  . Arthritis     "back" (11/21/2014)  . Chronic lower back pain   . Chronic kidney disease   . Urinary incontinence     Encounter Details:     CNP Questionnaire - 05/16/15 1210    Patient Demographics   Is this a new or existing patient? Existing   Patient is considered a/an Not Applicable   Race Caucasian/White   Patient Assistance   Location of Patient Assistance Not Applicable   Patient's financial/insurance status Low Income;Medicaid;Medicare   Uninsured Patient No   Patient referred to apply for the following financial assistance Not Applicable   Food insecurities addressed Not Applicable   Transportation assistance No   Assistance securing medications No   Educational health offerings Hypertension;Nutrition;Safety;Spiritual care   Encounter Details   Primary purpose of visit Chronic Illness/Condition Visit   Was an Emergency Department visit averted? Not Applicable   Does patient have a medical provider? Yes   Patient referred to Not Applicable   Was a mental health screening completed? (GAINS tool) No   Does patient have dental issues? No   Does patient have vision issues? No   Since previous encounter, have you referred patient for abnormal blood  pressure that resulted in a new diagnosis or medication change? No   Since previous encounter, have you referred patient for abnormal blood glucose that resulted in a new diagnosis or medication change? No   For Abstraction Use Only   Does patient have insurance? Yes    Visited client at home for a blood pressure check BP 148/80 mmHg Ms. Tuggley stated stated 3 hours prior to blood pressure taken used nebulizer. Lungs were clear. [employer], FCN

## 2015-05-19 DIAGNOSIS — R32 Unspecified urinary incontinence: Secondary | ICD-10-CM | POA: Diagnosis not present

## 2015-05-20 DIAGNOSIS — R32 Unspecified urinary incontinence: Secondary | ICD-10-CM | POA: Diagnosis not present

## 2015-05-21 DIAGNOSIS — R32 Unspecified urinary incontinence: Secondary | ICD-10-CM | POA: Diagnosis not present

## 2015-05-22 ENCOUNTER — Other Ambulatory Visit: Payer: Self-pay | Admitting: *Deleted

## 2015-05-22 DIAGNOSIS — R32 Unspecified urinary incontinence: Secondary | ICD-10-CM | POA: Diagnosis not present

## 2015-05-22 NOTE — Patient Outreach (Signed)
[facility]) Care Management   05/22/2015  GITTEL MCCAMISH [birth year] [id number]  [name] [name] is an 75 y.o. female  Subjective: " I have been doing pretty good, no breathing problems'  Objective:  BP 134/78 mmHg  Pulse 62  Resp 20  SpO2 97% on 2 liters Review of Systems  Constitutional: Negative.   HENT: Negative.   Eyes: Negative.   Respiratory: Negative for cough, shortness of breath and wheezing.   Cardiovascular: Positive for leg swelling. Negative for chest pain.  Gastrointestinal: Negative.   Genitourinary: Negative.  Negative for dysuria.  Musculoskeletal: Negative.   Skin: Negative.   Neurological: Negative.   Psychiatric/Behavioral: Negative.     Physical Exam  Constitutional: She is oriented to person, place, and time. She appears well-developed and well-nourished.  Cardiovascular: Normal rate and normal heart sounds.   Respiratory: Effort normal. She has no wheezes.  Slightly decreased breath sounds  GI: Soft.  Neurological: She is alert and oriented to person, place, and time.  Skin: Skin is warm, dry and intact.  Psychiatric: She has a normal mood and affect. Her behavior is normal. Judgment and thought content normal.    Current Medications:   Current Outpatient Prescriptions  Medication Sig Dispense Refill  . albuterol (PROVENTIL) (2.5 MG/3ML) 0.083% nebulizer solution Take 2.5 mg by nebulization every 6 (six) hours as needed for wheezing or shortness of breath.     Marland Kitchen atorvastatin (LIPITOR) 40 MG tablet Take 40 mg by mouth every morning.     . digoxin (LANOXIN) 0.25 MG tablet Take 0.125 mg by mouth every morning.     . diltiazem (CARDIZEM CD) 240 MG 24 hr capsule Take 240 mg by mouth every morning.   1  . docusate sodium (COLACE) 100 MG capsule Take 100 mg by mouth daily as needed (constipation).     . Ferrous Sulfate (IRON) 325 (65 FE) MG TABS Take 325 mg by mouth daily.    . fluticasone (FLONASE) 50 MCG/ACT nasal spray Place 1 spray  into both nostrils daily as needed for allergies or rhinitis.    . furosemide (LASIX) 20 MG tablet Take 1 tablet (20 mg total) by mouth daily. 30 tablet 1  . metoprolol succinate (TOPROL-XL) 50 MG 24 hr tablet Take 12.5 mg by mouth daily. Take with or immediately following a meal. Take 1/4 of a 50 mg tablet    . Omega-3 Fatty Acids (FISH OIL) 1000 MG CAPS Take 1,000 capsules by mouth daily.    Marland Kitchen oxybutynin (DITROPAN-XL) 5 MG 24 hr tablet Take 10 mg by mouth at bedtime.    . pantoprazole (PROTONIX) 40 MG tablet Take 1 tablet (40 mg total) by mouth daily. 30 tablet 0  . rivaroxaban (XARELTO) 20 MG TABS tablet Take 20 mg by mouth daily with supper.    . traMADol (ULTRAM) 50 MG tablet Take 1 tablet (50 mg total) by mouth every 6 (six) hours as needed for moderate pain (pain). (Patient taking differently: Take 50 mg by mouth at bedtime as needed for moderate pain (pain). ) 30 tablet 0  . VENTOLIN HFA 108 (90 BASE) MCG/ACT inhaler Inhale 2 puffs into the lungs 4 (four) times daily as needed for wheezing or shortness of breath.     . vitamin B-12 (CYANOCOBALAMIN) 500 MCG tablet Take 1,000 mcg by mouth daily.     . budesonide-formoterol (SYMBICORT) 160-4.5 MCG/ACT inhaler Inhale 2 puffs into the lungs 2 (two) times daily. 1 Inhaler 12  . ciprofloxacin (CIPRO)  500 MG tablet Take 1 tablet (500 mg total) by mouth 2 (two) times daily. For 7 days (Patient not taking: Reported on 12/19/2014) 14 tablet 0  . iron polysaccharides (NIFEREX) 150 MG capsule Take 1 capsule (150 mg total) by mouth daily. (Patient not taking: Reported on 12/19/2014) 60 capsule 1  . tiotropium (SPIRIVA) 18 MCG inhalation capsule Place 1 capsule (18 mcg total) into inhaler and inhale daily. (Patient not taking: Reported on 02/20/2015) 30 capsule 12   No current facility-administered medications for this visit.    Functional Status:   In your present state of health, do you have any difficulty performing the following activities: 05/22/2015  02/20/2015  Hearing? [name]  Vision? N N  Difficulty concentrating or making decisions? N N  Walking or climbing stairs? N N  Dressing or bathing? N N  Doing errands, shopping? N Y  Conservation officer, nature and eating ? N N  Using the Toilet? N N  In the past six months, have you accidently leaked urine? Y Y  Do you have problems with loss of bowel control? N N  Managing your Medications? N N  Managing your Finances? N N  Housekeeping or managing your Housekeeping? [name]   Fall Risk  05/22/2015 02/20/2015 11/19/2014 10/07/2014 10/07/2014  Falls in the past year? _0   Risk for fall due to : History of fall(s) Impaired balance/gait;History of fall(s) Impaired balance/gait;History of fall(s);Impaired mobility History of fall(s);Impaired balance/gait History of fall(s)  Risk for fall due to (comments): - - - - patient states its been 3 years, using rollator   Fall/Depression Screening:    PHQ 2/9 Scores 05/22/2015 03/31/2015 02/20/2015 02/20/2015 11/19/2014 10/25/2014 10/07/2014  PHQ - 2 Score 0 0 0 0 0 0 0    Assessment:  Routine home visit.  COPD Patient reports no breathing concerns, states she is in green zone . Patient able to verbalize yellow zone symptoms to notify MD of, and action plan. Patient continues to wear her oxygen continuously at 2 liters, and carries her rescue inhaler with her all times. Patient reports she has used her rescue 3 times in the last 2 weeks.   Medication Adherence concerns: Patient continues to fill her  weekly pill organizer, she verbalized that she likes to do this on her own. Mrs.Fuhs main concern is being able to [facility] each month when she needs a refill she has difficulty with this. Patient has  Knowledge of each of her medications and what they are used for.Patient has all medications available at visit and has her pill box filled through Saturday medication in pill box checked for accuracy. Mrs.Bevilacqua states she sits at her kitchen table to fill her pill pox, and  reports recently that her personal care aide found pills on the floor that she had dropped and indicated that they where her digoxin pills. Mrs.Sippel is able to identify which medications she needs refill on , her difficulty is calling in refills. Patient provided me with the bottles of medication that needs to be refilled, [facility]. Patient has placed bottles that are due to be refilled in a separate bag, she states she likes to take bottles with her to pharmacy to verify patient states her son will take her on Saturday to pick up prescriptions. Patient's digoxin is not due for refill until 3/16, per pharmacy it can be picked on 3/15, patient has medication up until that time, and educated with  teachback on when she can pick it up.  Mrs. Battie has her personal care aide 5 days a week, she is also followed by congregational nurse once a month, I was able to speak with Para March, RN  on today that has followed patient for she states at least 5 years. Cecille Rubin states that Mrs. Pichon is on the waiting list for [employer] to assist her with medication pill box refill each month, she is aware that the patient wants to do this for herself as she is able, with the service the nurse can assist and ensure accuracy.    Plan:  Follow up telephone assessment with patient on 3/17 to inquire whether she has set up her pill boxes  Place telephone call to patient's son per her request to discuss his assistance, with calling in patient's needed prescription refills on Saturday , once a month when he visits, and discuss case closure approaching when  medication management situation is resolved.  [name] CM Care Plan Problem One        Most Recent Value   Care Plan Problem One  Adherence to COPD action plan   Role Documenting the Problem One  Care Management Coordinator   Care Plan for Problem One  Not Active   [name] Long Term Goal (31-90 days)  Patient will be able to follow the COPD action plan and notify  MD for symptoms in the yellow Zones in the next 31 days   [name] Long Term Goal Start Date  05/24/14   [name] Long Term Goal Met Date  07/30/14   [name] CM Short Term Goal #1 (0-30 days)  Patient will be able to state the symptoms  in the yellow zone that she should call the MD about in the next 30 days   [name] CM Short Term Goal #1 Start Date  05/24/14   [facility] CM Short Term Goal #1 Met Date  06/20/14   [name] CM Short Term Goal #2 (0-30 days)  patient will review the COPD material within the next 30 days    [name] CM Short Term Goal #2 Start Date  05/24/14   [facility] CM Short Term Goal #2 Met Date  06/20/14    [facility] CM Care Plan Problem Two        Most Recent Value   Care Plan Problem Two  Fall Risk   Role Documenting the Problem Two  Care Management Coordinator   Care Plan for Problem Two  Not Active   [name] Long Term Goal (31-90) days  Patient will not experience a fall in the next 31 days   [name] Long Term Goal Start Date  05/24/14   [name] Long Term Goal Met Date  06/20/14   [name] CM Short Term Goal #1 (0-30 days)  Patient will use safety equipment consistenly within the next 30 days to decrease fall   [name] CM Short Term Goal #1 Start Date  05/24/14   [facility] CM Short Term Goal #1 Met Date   06/20/14   [name] CM Short Term Goal #2 (0-30 days)  Pt will discuss  with MD on next appointment about physical therapy for strengthen   [facility] CM Short Term Goal #2 Start Date  05/24/14   [facility] CM Short Term Goal #2 Met Date  06/20/14   [name] CM Short Term Goal #3 (0-30 days)  Patient will report completing chair exercise at least 3 days a week as tolerated   [name] CM Short Term Goal #3 Start Date  01/09/15   [name] CM Short Term Goal #3 Met Date  02/10/15    [facility] CM Care Plan Problem Three        Most Recent Value   Care Plan Problem Three  Frequent Hospital Admissions   Role Documenting the Problem Three  Care Management Coordinator   Care Plan for Problem Three  Active   [name] Long Term Goal (31-90) days  Patient will not have a hospital  admisssion in the next 31 days   [name] Long Term Goal Start Date  11/29/14   [facility] Long Term Goal Met Date  12/29/14 [goal not met, date restarted.]   [name] CM Short Term Goal #1 (0-30 days)  Patient will not experience  hospital admission in the next 30 days   [name] CM Short Term Goal #1 Start Date  11/11/14   [facility] CM Short Term Goal #1 Met Date  12/30/14 [goal not met. Readmitted on 9/7]   [name] CM Short Term Goal #2 (0-30 days)  Patient will be verbalized home health nurse visit by 12/03/2014   [facility] CM Short Term Goal #2 Start Date  11/29/14   [name] CM Short Term Goal #2 Met Date  12/03/14   [name] CM Short Term Goal #3 (0-30 days)  Patient will be able to verbalize call primary MD for follow up appointment in 1 week   [name]  CM Short Term Goal #3 Start Date  11/29/14   [facility] CM Short Term Goal #3 Met Date  12/19/14   [name] CM Short Term Goal #4 (0-30 days)  Patient will be able to resolve concern with getting medication refills monthly in the next 30 days   [name] CM Short Term Goal #4 Start Date  05/22/15 [goal date restarted]   Interventions for Short Term Goal #4  Discuss with patient son [name], whether he is able to call in needed prescription on monthly basis, follow up with congregational nurse  patient status on CAPS, service to assit with monthly filling pill box     Joylene Draft, RN, [place] Management [phone]- Mobile [phone]- [place]

## 2015-05-23 DIAGNOSIS — R32 Unspecified urinary incontinence: Secondary | ICD-10-CM | POA: Diagnosis not present

## 2015-05-26 DIAGNOSIS — R32 Unspecified urinary incontinence: Secondary | ICD-10-CM | POA: Diagnosis not present

## 2015-05-27 ENCOUNTER — Other Ambulatory Visit: Payer: Self-pay | Admitting: *Deleted

## 2015-05-27 DIAGNOSIS — J449 Chronic obstructive pulmonary disease, unspecified: Secondary | ICD-10-CM | POA: Diagnosis not present

## 2015-05-27 DIAGNOSIS — R32 Unspecified urinary incontinence: Secondary | ICD-10-CM | POA: Diagnosis not present

## 2015-05-27 NOTE — Patient Outreach (Addendum)
[place] [facility]) Care Management  05/27/2015  [name] [name] 08/27/1940 [id number]   Verbal consent has been given by [name] [name] for me to contact her son , [name] [name] regarding if he will be available to assist patient with calling in her needed refills to pharmacy. HIPAA verified, [name] [name] states that he would be available to assist with [facility] when his mother needs refills on medications, I explained to him that [name] [name] will keep a list  When she is getting close to one week supply, and notify [name] [name] so that he he can assist her by placing a  call [facility], [name] [name] and his wife  is agreeable. I also discussed case closure with [name] [name] he is agreeable and extended thanks for the care we have provided.  Plan Next follow up with patient is 3/16 by phone and then will schedule case closure visit in April at home visit and communicate with her son at the time of visit.  [name] Draft, RN, [place] Management [phone]- Mobile [phone]- T[employer] [place] complies with applicable Federal civil rights laws and does not discriminate on the basis of race, color, national origin, age, disability, or sex. Espaol (Spanish)  [place] cumple con las leyes federales de derechos civiles aplicables y no discrimina por motivos de raza, color, nacionalidad, edad, discapacidad o sexo.  [name] [name] ([place])  [place] tun th? lu?t dn quy?n hi?n hnh c?a Lin bang v khng phn bi?t ??i x? d?a trn ch?ng t?c, mu da, ngu?n g?c qu?c gia, ?? tu?i, khuy?t t?t, ho?c gi?i tnh.   (Arabic)   [place]                      .

## 2015-05-28 DIAGNOSIS — R32 Unspecified urinary incontinence: Secondary | ICD-10-CM | POA: Diagnosis not present

## 2015-05-29 ENCOUNTER — Other Ambulatory Visit: Payer: Self-pay | Admitting: *Deleted

## 2015-05-29 ENCOUNTER — Ambulatory Visit: Payer: Self-pay | Admitting: *Deleted

## 2015-05-29 DIAGNOSIS — R32 Unspecified urinary incontinence: Secondary | ICD-10-CM | POA: Diagnosis not present

## 2015-05-29 NOTE — Patient Outreach (Signed)
[facility] [facility]) Care Management  05/29/2015  FAYTHE HULSE 1940/04/17 [id number]   Unsuccessful telephone call for scheduled telephone outreach, unable to leave a message.  Plan I will attempt to contact patient in the next 1 to  3 days.  Joylene Draft, RN, [place] Management [phone]- Mobile [phone]- Toll Free Main Office

## 2015-05-30 ENCOUNTER — Other Ambulatory Visit: Payer: Self-pay | Admitting: *Deleted

## 2015-05-30 DIAGNOSIS — R32 Unspecified urinary incontinence: Secondary | ICD-10-CM | POA: Diagnosis not present

## 2015-05-30 NOTE — Patient Outreach (Signed)
[facility]) Care Management  05/30/2015  [name] [name] [birth year] [id number]  Telephone assessment call to [name] [name], HIPPA verified.  [name] [name] reports that her son assisted her picking up needed refills from the pharmacy, and her pill boxes are filled. Discussed with [name] [name] as she has given verbal consent that I may speak with her son Ardine Bjork regarding, assisting patient with calling in her prescriptions as needed and that he has agreed. I also discussed case closure at next home visit as care management needs are being met.  [name] [name] reports that her breathing is in the green zone on today, patient denies any new concerns at present. Reviewed with patient symptoms to notify MD of and she able to teach back symptoms of concerns to notify MD or 911 for emergency.  Plan Home visit on 4/4 as case closure visit.  Joylene Draft, RN, [place] Management [phone]- Mobile [phone]- Toll Free Main Office

## 2015-06-02 DIAGNOSIS — R32 Unspecified urinary incontinence: Secondary | ICD-10-CM | POA: Diagnosis not present

## 2015-06-03 DIAGNOSIS — R32 Unspecified urinary incontinence: Secondary | ICD-10-CM | POA: Diagnosis not present

## 2015-06-04 DIAGNOSIS — R32 Unspecified urinary incontinence: Secondary | ICD-10-CM | POA: Diagnosis not present

## 2015-06-05 DIAGNOSIS — R32 Unspecified urinary incontinence: Secondary | ICD-10-CM | POA: Diagnosis not present

## 2015-06-09 DIAGNOSIS — R32 Unspecified urinary incontinence: Secondary | ICD-10-CM | POA: Diagnosis not present

## 2015-06-15 DIAGNOSIS — J449 Chronic obstructive pulmonary disease, unspecified: Secondary | ICD-10-CM | POA: Diagnosis not present

## 2015-06-17 ENCOUNTER — Encounter: Payer: Self-pay | Admitting: *Deleted

## 2015-06-17 ENCOUNTER — Other Ambulatory Visit: Payer: Self-pay | Admitting: *Deleted

## 2015-06-17 NOTE — Patient Outreach (Signed)
[facility] [facility]) Care Management   06/17/2015  [name] [name] [name] Apr 23, 1940 [id number]  [name] [name] [name] [name] [name] an 75 y.o. female  Subjective: " I am doing good"  Objective:  BP 140/70 mmHg  Pulse 70  Resp 20  SpO2 96% Review of Systems  Constitutional: Negative.   HENT:       Hard of hearing  Eyes: Negative.   Respiratory: Negative for cough and shortness of breath.   Cardiovascular: Negative.        Slight leg edema  Gastrointestinal: Negative.   Musculoskeletal: Negative for falls.  Skin: Negative.   Neurological: Negative.   Psychiatric/Behavioral: Negative.     Physical Exam  Constitutional: She [name] [name] oriented to person, place, and time. She appears well-developed and well-nourished.  Cardiovascular: Normal rate, regular rhythm, normal heart sounds and intact distal pulses.   Respiratory: Effort normal. She has decreased breath sounds.  Decreased breath sounds  GI: Soft.  Neurological: She [name] [name] alert and oriented to person, place, and time.  Skin: Skin [name] [name] warm and dry.  Psychiatric: She has a normal mood and affect. Her behavior [name] [name] normal. Judgment and thought content normal.    Encounter Medications:   Outpatient Encounter Prescriptions as of 06/17/2015  Medication Sig Note  . albuterol (PROVENTIL) (2.5 MG/3ML) 0.083% nebulizer solution Take 2.5 mg by nebulization every 6 (six) hours as needed for wheezing or shortness of breath.    Marland Kitchen atorvastatin (LIPITOR) 40 MG tablet Take 40 mg by mouth every morning.    . budesonide-formoterol (SYMBICORT) 160-4.5 MCG/ACT inhaler Inhale 2 puffs into the lungs 2 (two) times daily. 11/19/2014: taking  . ciprofloxacin (CIPRO) 500 MG tablet Take 1 tablet (500 mg total) by mouth 2 (two) times daily. For 7 days (Patient not taking: Reported on 12/19/2014)   . digoxin (LANOXIN) 0.25 MG tablet Take 0.125 mg by mouth every morning.    . diltiazem (CARDIZEM CD) 240 MG 24 hr capsule Take 240 mg by mouth every morning.  09/14/2014: ...  .  docusate sodium (COLACE) 100 MG capsule Take 100 mg by mouth daily as needed (constipation).    . Ferrous Sulfate (IRON) 325 (65 FE) MG TABS Take 325 mg by mouth daily. 11/20/2014: .   . fluticasone (FLONASE) 50 MCG/ACT nasal spray Place 1 spray into both nostrils daily as needed for allergies or rhinitis.   . furosemide (LASIX) 20 MG tablet Take 1 tablet (20 mg total) by mouth daily.   Marland Kitchen guaiFENesin (MUCINEX) 600 MG 12 hr tablet Take 600 mg by mouth daily.   . iron polysaccharides (NIFEREX) 150 MG capsule Take 1 capsule (150 mg total) by mouth daily. (Patient not taking: Reported on 12/19/2014) 11/19/2014: taking  . metoprolol succinate (TOPROL-XL) 50 MG 24 hr tablet Take 12.5 mg by mouth daily. Take with or immediately following a meal. Take 1/4 of a 50 mg tablet   . Omega-3 Fatty Acids (FISH OIL) 1000 MG CAPS Take 1,000 capsules by mouth daily.   Marland Kitchen oxybutynin (DITROPAN-XL) 5 MG 24 hr tablet Take 10 mg by mouth at bedtime. 02/10/2015: Patient states out medication since, November 7, Pharmacy called for refill  . pantoprazole (PROTONIX) 40 MG tablet Take 1 tablet (40 mg total) by mouth daily.   . rivaroxaban (XARELTO) 20 MG TABS tablet Take 20 mg by mouth daily with supper.   . tiotropium (SPIRIVA) 18 MCG inhalation capsule Place 1 capsule (18 mcg total) into inhaler and inhale daily. (Patient not taking: Reported on 02/20/2015) 12/19/2014: Not  taking per report  . traMADol (ULTRAM) 50 MG tablet Take 1 tablet (50 mg total) by mouth every 6 (six) hours as needed for moderate pain (pain). (Patient taking differently: Take 50 mg by mouth at bedtime as needed for moderate pain (pain). ) 11/19/2014: Taking as needed  . VENTOLIN HFA 108 (90 BASE) MCG/ACT inhaler Inhale 2 puffs into the lungs 4 (four) times daily as needed for wheezing or shortness of breath.  09/14/2014: ...  . vitamin B-12 (CYANOCOBALAMIN) 500 MCG tablet Take 1,000 mcg by mouth daily.     No facility-administered encounter medications on file as  of 06/17/2015.    Functional Status:   In your present state of health, do you have any difficulty performing the following activities: 05/22/2015 02/20/2015  Hearing? [name] [name] [name]  Vision? N N  Difficulty concentrating or making decisions? N N  Walking or climbing stairs? N N  Dressing or bathing? N N  Doing errands, shopping? N Y  Conservation officer, nature and eating ? N N  Using the Toilet? N N  In the past six months, have you accidently leaked urine? Y Y  Do you have problems with loss of bowel control? N N  Managing your Medications? N N  Managing your Finances? N N  Housekeeping or managing your Housekeeping? [name] [name] [name]    Fall/Depression Screening:    PHQ 2/9 Scores 05/22/2015 03/31/2015 02/20/2015 02/20/2015 11/19/2014 10/25/2014 10/07/2014  PHQ - 2 Score 0 0 0 0 0 0 0    Assessment:  Routine home visit for case closure  [name] [name] [name] discussed issues she has had with her home health aide, with the help of her son patient able to contact [employer]  and [employer] [name] [name] in the process of finding her a new aide, patient reports that she [name] [name] managing well, and her son and Daughter in law visit her every Saturday to help with errands and making sure she has needed medications.   COPD Patient reports her breathing [name] [name] in the green, she takes her albuterol inhaler with her when out of the home, denies having to use it in the last week. [name] [name] [name] reports using her nebulizer only as needed about twice daily.  Medication concerns Medication reviewed, all medication present , patient also has her pill organizer filled, [name] [name] [name] states  her son [name] [name] [name] and his wife [name] [name] [name] will come on Saturday, to take her to drug store to get needed refills, patient has enough medications available.  Reinforced with patient importance with notifying MD of signs of worsening symptoms of COPD, patient able to state signs and symptoms in yellow zone  and actions to take, notifying MD for office appointment and 911 for emergency red zone.   Plan:   Will  Close case as patient care goals at met. RNCM will alert MD and Care management assistant.   [name] [name] [name] Draft, RN, [place] Management [phone]- Mobile [phone]- Toll Free Main Office

## 2015-06-27 DIAGNOSIS — J449 Chronic obstructive pulmonary disease, unspecified: Secondary | ICD-10-CM | POA: Diagnosis not present

## 2015-07-15 DIAGNOSIS — J449 Chronic obstructive pulmonary disease, unspecified: Secondary | ICD-10-CM | POA: Diagnosis not present

## 2015-07-27 DIAGNOSIS — J449 Chronic obstructive pulmonary disease, unspecified: Secondary | ICD-10-CM | POA: Diagnosis not present

## 2015-07-30 DIAGNOSIS — I6523 Occlusion and stenosis of bilateral carotid arteries: Secondary | ICD-10-CM | POA: Diagnosis not present

## 2015-08-15 DIAGNOSIS — J449 Chronic obstructive pulmonary disease, unspecified: Secondary | ICD-10-CM | POA: Diagnosis not present

## 2015-08-18 DIAGNOSIS — J432 Centrilobular emphysema: Secondary | ICD-10-CM | POA: Diagnosis not present

## 2015-08-18 DIAGNOSIS — I6523 Occlusion and stenosis of bilateral carotid arteries: Secondary | ICD-10-CM | POA: Diagnosis not present

## 2015-08-18 DIAGNOSIS — I480 Paroxysmal atrial fibrillation: Secondary | ICD-10-CM | POA: Diagnosis not present

## 2015-08-18 DIAGNOSIS — I350 Nonrheumatic aortic (valve) stenosis: Secondary | ICD-10-CM | POA: Diagnosis not present

## 2015-08-22 DIAGNOSIS — R300 Dysuria: Secondary | ICD-10-CM | POA: Diagnosis not present

## 2015-08-22 DIAGNOSIS — Z1231 Encounter for screening mammogram for malignant neoplasm of breast: Secondary | ICD-10-CM | POA: Diagnosis not present

## 2015-08-22 DIAGNOSIS — N390 Urinary tract infection, site not specified: Secondary | ICD-10-CM | POA: Diagnosis not present

## 2015-08-22 DIAGNOSIS — Z6824 Body mass index (BMI) 24.0-24.9, adult: Secondary | ICD-10-CM | POA: Diagnosis not present

## 2015-08-23 ENCOUNTER — Encounter (HOSPITAL_COMMUNITY): Payer: Self-pay | Admitting: Emergency Medicine

## 2015-08-23 ENCOUNTER — Emergency Department (HOSPITAL_COMMUNITY)
Admission: EM | Admit: 2015-08-23 | Discharge: 2015-08-23 | Disposition: A | Discharge status: Home / Self Care | Payer: Commercial Managed Care - HMO | Attending: Emergency Medicine | Admitting: Emergency Medicine

## 2015-08-23 ENCOUNTER — Emergency Department (HOSPITAL_COMMUNITY): Payer: Commercial Managed Care - HMO

## 2015-08-23 DIAGNOSIS — N189 Chronic kidney disease, unspecified: Secondary | ICD-10-CM | POA: Diagnosis not present

## 2015-08-23 DIAGNOSIS — N390 Urinary tract infection, site not specified: Secondary | ICD-10-CM | POA: Insufficient documentation

## 2015-08-23 DIAGNOSIS — J441 Chronic obstructive pulmonary disease with (acute) exacerbation: Secondary | ICD-10-CM | POA: Diagnosis not present

## 2015-08-23 DIAGNOSIS — R0602 Shortness of breath: Secondary | ICD-10-CM | POA: Insufficient documentation

## 2015-08-23 DIAGNOSIS — R0600 Dyspnea, unspecified: Secondary | ICD-10-CM

## 2015-08-23 DIAGNOSIS — Z79899 Other long term (current) drug therapy: Secondary | ICD-10-CM | POA: Insufficient documentation

## 2015-08-23 DIAGNOSIS — Z96641 Presence of right artificial hip joint: Secondary | ICD-10-CM | POA: Diagnosis not present

## 2015-08-23 DIAGNOSIS — Z87891 Personal history of nicotine dependence: Secondary | ICD-10-CM | POA: Insufficient documentation

## 2015-08-23 DIAGNOSIS — R069 Unspecified abnormalities of breathing: Secondary | ICD-10-CM | POA: Diagnosis not present

## 2015-08-23 DIAGNOSIS — I129 Hypertensive chronic kidney disease with stage 1 through stage 4 chronic kidney disease, or unspecified chronic kidney disease: Secondary | ICD-10-CM | POA: Diagnosis not present

## 2015-08-23 DIAGNOSIS — M545 Low back pain: Secondary | ICD-10-CM | POA: Diagnosis not present

## 2015-08-23 DIAGNOSIS — M1990 Unspecified osteoarthritis, unspecified site: Secondary | ICD-10-CM | POA: Diagnosis not present

## 2015-08-23 DIAGNOSIS — J449 Chronic obstructive pulmonary disease, unspecified: Secondary | ICD-10-CM | POA: Insufficient documentation

## 2015-08-23 DIAGNOSIS — G8929 Other chronic pain: Secondary | ICD-10-CM | POA: Diagnosis not present

## 2015-08-23 LAB — BASIC METABOLIC PANEL
ANION GAP: 6 (ref 5–15)
BUN: 14 mg/dL (ref 6–20)
CHLORIDE: 97 mmol/L — AB (ref 101–111)
CO2: 35 mmol/L — AB (ref 22–32)
Calcium: 8.9 mg/dL (ref 8.9–10.3)
Creatinine, Ser: 0.76 mg/dL (ref 0.44–1.00)
GFR calc non Af Amer: >60 mL/min (ref >60)
Glucose, Bld: 86 mg/dL (ref 65–99)
POTASSIUM: 3.7 mmol/L (ref 3.5–5.1)
SODIUM: 138 mmol/L (ref 135–145)

## 2015-08-23 LAB — URINALYSIS, ROUTINE W REFLEX MICROSCOPIC
BILIRUBIN URINE: NEGATIVE
Glucose, UA: NEGATIVE mg/dL
Ketones, ur: NEGATIVE mg/dL
NITRITE: NEGATIVE
PH: 8 (ref 5.0–8.0)
Protein, ur: NEGATIVE mg/dL
SPECIFIC GRAVITY, URINE: 1.006 (ref 1.005–1.030)

## 2015-08-23 LAB — CBC WITH DIFFERENTIAL/PLATELET
Basophils Absolute: 0 10*3/uL (ref 0.0–0.1)
Basophils Relative: 0 %
EOS ABS: 0 10*3/uL (ref 0.0–0.7)
Eosinophils Relative: 1 %
HEMATOCRIT: 37.8 % (ref 36.0–46.0)
HEMOGLOBIN: 11.4 g/dL — AB (ref 12.0–15.0)
LYMPHS ABS: 0.9 10*3/uL (ref 0.7–4.0)
LYMPHS PCT: 14 %
MCH: 27.4 pg (ref 26.0–34.0)
MCHC: 30.2 g/dL (ref 30.0–36.0)
MCV: 90.9 fL (ref 78.0–100.0)
Monocytes Absolute: 1 10*3/uL (ref 0.1–1.0)
Monocytes Relative: 15 %
NEUTROS ABS: 4.3 10*3/uL (ref 1.7–7.7)
NEUTROS PCT: 70 %
Platelets: 113 10*3/uL — ABNORMAL LOW (ref 150–400)
RBC: 4.16 MIL/uL (ref 3.87–5.11)
RDW: 14.9 % (ref 11.5–15.5)
WBC: 6.2 10*3/uL (ref 4.0–10.5)

## 2015-08-23 LAB — URINE MICROSCOPIC-ADD ON

## 2015-08-23 LAB — I-STAT TROPONIN, ED: TROPONIN I, POC: 0.01 ng/mL (ref 0.00–0.08)

## 2015-08-23 LAB — BRAIN NATRIURETIC PEPTIDE: B Natriuretic Peptide: 135.4 pg/mL — ABNORMAL HIGH (ref 0.0–100.0)

## 2015-08-23 MED ORDER — IPRATROPIUM-ALBUTEROL 0.5-2.5 (3) MG/3ML IN SOLN
3.0000 mL | Freq: Once | RESPIRATORY_TRACT | Status: AC
  Administered 2015-08-23: 3 mL via RESPIRATORY_TRACT
  Filled 2015-08-23: qty 3

## 2015-08-23 MED ORDER — FUROSEMIDE 10 MG/ML IJ SOLN
40.0000 mg | Freq: Once | INTRAMUSCULAR | Status: AC
  Administered 2015-08-23: 40 mg via INTRAVENOUS
  Filled 2015-08-23: qty 4

## 2015-08-23 MED ORDER — PREDNISONE 20 MG PO TABS: 20.0000 mg | ORAL_TABLET | Freq: Two times a day (BID) | ORAL | Status: DC

## 2015-08-23 MED ORDER — CEPHALEXIN 500 MG PO CAPS: 500.0000 mg | ORAL_CAPSULE | Freq: Two times a day (BID) | ORAL | Status: DC

## 2015-08-23 MED ORDER — FUROSEMIDE 10 MG/ML IJ SOLN: 60.0000 mg | Freq: Once | INTRAMUSCULAR | Status: DC

## 2015-08-23 MED ORDER — METHYLPREDNISOLONE SODIUM SUCC 125 MG IJ SOLR
125.0000 mg | Freq: Once | INTRAMUSCULAR | Status: AC
Start: 2015-08-23 — End: 2015-08-23
  Administered 2015-08-23: 125 mg via INTRAVENOUS
  Filled 2015-08-23: qty 2

## 2015-08-23 MED ORDER — CEFTRIAXONE SODIUM 1 G IJ SOLR
1.0000 g | Freq: Once | INTRAMUSCULAR | Status: AC
  Administered 2015-08-23: 1 g via INTRAMUSCULAR
  Filled 2015-08-23: qty 10

## 2015-08-23 NOTE — ED Provider Notes (Signed)
CSN: [id number]     Arrival date & time 08/23/15  0940 History   None    Chief Complaint  Patient presents with  . COPD     (Consider location/radiation/quality/duration/timing/severity/associated sxs/prior Treatment) The history is provided by the patient.  Patient is a 75 yo F with a PMHx of HTN, asthma, COPD on 3L home O2, emphysema, R pneumothorax in 09/2014, chronic bronchitis, CKD, urinary incontinence presenting to the ED complaining of SOB with exertion/ activity for the past 1 week. As per EMS, found to be satting 88% on home 4L O2. States she ran out of some of her medications. Reports having a chronic cough productive of clear sputum. Patient does not believe her cough is getting any worse and denies having any increase in sputum production or change in color of sputum. Denies having any chest pain. Reports having rhinorrhea and sneezing. Denies having any fevers, chills, or sore throat. No recent sick contacts. States she normally uses 1 pillow to sleep at night but has been experiencing SOB with this position for the past few weeks, as such, has been sleeping sitting up. States her legs are chronically swollen but swelling has been worse for the past 1 week and she believes her abdomen looks a little distended. Denies having any abdominal pain, nausea, vomiting, or diarrhea. Reports taking Lasix 20 mg once daily.    Patient states she was diagnosed with a UTI yesterday by her PCP and started on an antibiotic. Reports having dysuria.   Past Medical History  Diagnosis Date  . Hypertension   . Shortness of breath   . Asthma   . COPD (chronic obstructive pulmonary disease) (HCC)     emphysema    . GERD (gastroesophageal reflux disease)   . Blood dyscrasia     hx blood clotts  . Hyperlipidemia   . Anemia   . Pneumothorax on right 09/2014  . On home oxygen therapy     "3L; 24/7" (11/21/2014)  . Pneumonia     hx pneumonia   . Chronic bronchitis ([place])   . Pulmonary embolism ([place])    . History of blood transfusion X 2    "related to blood thinner I was taking"  . [id number]     "a few times/year" (11/21/2014)  . Migraine     "weekly probably" (11/21/2014)  . Arthritis     "back" (11/21/2014)  . Chronic lower back pain   . Chronic kidney disease   . Urinary incontinence    Past Surgical History  Procedure Laterality Date  . Hemiarthroplasty shoulder fracture Right   . Cataract extraction w/ intraocular lens  implant, bilateral Bilateral   . Orif wrist fracture  10/13/2011    Procedure: OPEN REDUCTION INTERNAL FIXATION (ORIF) WRIST FRACTURE;  Surgeon: [name], MD;  Location: [facility];  Service: Orthopedics;  Laterality: Right;  Open Reduction Internal Fixation Right Distal Radius   . Hardware removal  01/29/2012    Procedure: HARDWARE REMOVAL;  Surgeon: [name], MD;  Location: [facility];  Service: Orthopedics;  Laterality: Right;  . Hip arthroplasty  01/29/2012    Procedure: ARTHROPLASTY BIPOLAR HIP;  Surgeon: [name], MD;  Location: [facility];  Service: Orthopedics;  Laterality: Right;  . Transurethral resection of bladder tumor N/A 06/11/2014    Procedure: TRANSURETHRAL RESECTION OF BLADDER TUMOR (TURBT);  Surgeon: [name], MD;  Location: [facility];  Service: Urology;  Laterality: N/A;  . Cystoscopy w/ retrogrades Bilateral 06/11/2014  Procedure: CYSTOSCOPY WITH RETROGRADE PYELOGRAM;  Surgeon: [name], MD;  Location: [facility];  Service: Urology;  Laterality: Bilateral;  . Fracture surgery    . Laparoscopic cholecystectomy    . Tubal ligation    . Abdominal hysterectomy     Family History  Problem Relation Age of Onset  . Cancer - Lung Mother   . Coronary artery disease Mother   . Coronary artery disease Sister   . Coronary artery disease Brother    Social History  Substance Use Topics  . Smoking status: Former Smoker -- 1.00 packs/day for 56 years    Types: Cigarettes    Quit date: 02/12/2014  . Smokeless tobacco: Never Used  . Alcohol  Use: No   OB History    No data available     Review of Systems  Constitutional: Negative for fever and chills.  HENT: Positive for rhinorrhea and sneezing. Negative for sore throat.   Eyes: Negative for pain and visual disturbance.  Respiratory: Positive for shortness of breath. Negative for wheezing.        Chronic cough   Cardiovascular: Positive for leg swelling. Negative for chest pain and palpitations.  Gastrointestinal: Positive for abdominal distention. Negative for nausea, vomiting, abdominal pain, diarrhea and constipation.  Genitourinary: Positive for dysuria. Negative for flank pain.  Musculoskeletal: Negative for myalgias.       Chronic lower back pain  Skin: Negative for pallor and rash.  Neurological: Negative for weakness, light-headedness and numbness.      Allergies  Eggs or egg-derived products  Home Medications   Prior to Admission medications   Medication Sig Start Date End Date Taking? Authorizing Provider  albuterol (PROVENTIL) (2.5 MG/3ML) 0.083% nebulizer solution Take 2.5 mg by nebulization every 6 (six) hours as needed for wheezing or shortness of breath.     Historical Provider, MD  atorvastatin (LIPITOR) 40 MG tablet Take 40 mg by mouth every morning.  07/02/13   Historical Provider, MD  budesonide-formoterol (SYMBICORT) 160-4.5 MCG/ACT inhaler Inhale 2 puffs into the lungs 2 (two) times daily. 08/06/13   Barton Dubois, MD  ciprofloxacin (CIPRO) 500 MG tablet Take 1 tablet (500 mg total) by mouth 2 (two) times daily. For 7 days Patient not taking: Reported on 12/19/2014 11/28/14   [name] R [name], PA-C  digoxin (LANOXIN) 0.25 MG tablet Take 0.125 mg by mouth every morning.     Historical Provider, MD  diltiazem (CARDIZEM CD) 240 MG 24 hr capsule Take 240 mg by mouth every morning.  04/08/14   Historical Provider, MD  docusate sodium (COLACE) 100 MG capsule Take 100 mg by mouth daily as needed (constipation).     Historical Provider, MD  Ferrous Sulfate  (IRON) 325 (65 FE) MG TABS Take 325 mg by mouth daily.    Historical Provider, MD  fluticasone (FLONASE) 50 MCG/ACT nasal spray Place 1 spray into both nostrils daily as needed for allergies or rhinitis. 08/06/13   Barton Dubois, MD  furosemide (LASIX) 20 MG tablet Take 1 tablet (20 mg total) by mouth daily. 06/13/14   Theodis Blaze, MD  guaiFENesin (MUCINEX) 600 MG 12 hr tablet Take 600 mg by mouth daily. Reported on 06/17/2015    Historical Provider, MD  iron polysaccharides (NIFEREX) 150 MG capsule Take 1 capsule (150 mg total) by mouth daily. Patient not taking: Reported on 12/19/2014 04/29/14   Annita Brod, MD  metoprolol succinate (TOPROL-XL) 50 MG 24 hr tablet Take 12.5 mg by mouth daily. Take with or  immediately following a meal. Take 1/4 of a 50 mg tablet    Historical Provider, MD  Omega-3 Fatty Acids (FISH OIL) 1000 MG CAPS Take 1,000 capsules by mouth daily.    Historical Provider, MD  oxybutynin (DITROPAN-XL) 5 MG 24 hr tablet Take 10 mg by mouth at bedtime.    Historical Provider, MD  pantoprazole (PROTONIX) 40 MG tablet Take 1 tablet (40 mg total) by mouth daily. [id number]   Delora Fuel, MD  rivaroxaban (XARELTO) 20 MG TABS tablet Take 20 mg by mouth daily with supper.    Historical Provider, MD  tiotropium (SPIRIVA) 18 MCG inhalation capsule Place 1 capsule (18 mcg total) into inhaler and inhale daily. Patient not taking: Reported on 02/20/2015 08/06/13   Barton Dubois, MD  traMADol (ULTRAM) 50 MG tablet Take 1 tablet (50 mg total) by mouth every 6 (six) hours as needed for moderate pain (pain). Patient taking differently: Take 50 mg by mouth at bedtime as needed for moderate pain (pain).  09/19/14   Donielle Liston Alba, PA-C  VENTOLIN HFA 108 (90 BASE) MCG/ACT inhaler Inhale 2 puffs into the lungs 4 (four) times daily as needed for wheezing or shortness of breath.  06/07/14   Historical Provider, MD  vitamin B-12 (CYANOCOBALAMIN) 500 MCG tablet Take 1,000 mcg by mouth daily.     Historical  Provider, MD   BP 186/75 mmHg  Pulse 89  Temp(Src) 98 F (36.7 C) (Oral)  Resp 16  SpO2 100% Physical Exam  Constitutional: She is oriented to person, place, and time. She appears well-developed and well-nourished. No distress.  HENT:  Head: Normocephalic and atraumatic.  Mouth/Throat: Oropharynx is clear and moist.  Eyes: EOM are normal. Pupils are equal, round, and reactive to light.  Neck: Neck supple. No tracheal deviation present.  Cardiovascular: Normal rate, regular rhythm and intact distal pulses.  Exam reveals no gallop and no friction rub.   Murmur heard. Grade 3/6 systolic murmur   Pulmonary/Chest: Effort normal and breath sounds normal. No respiratory distress. She has no wheezes. She has no rales.  Abdominal: Soft. Bowel sounds are normal. She exhibits no distension. There is no tenderness. There is no guarding.  Musculoskeletal:  +1 pitting edema of bilateral lower extremities.   Neurological: She is alert and oriented to person, place, and time.  Skin: Skin is warm and dry. No rash noted. She is not diaphoretic. No erythema.    ED Course  Procedures (including critical care time) Labs Review Labs Reviewed - No data to display  Imaging Review No results found. I have personally reviewed and evaluated these images and lab results as part of my medical decision-making.   EKG Interpretation None      MDM   Final diagnoses:  None   SOB Patient is presenting with worsening dyspnea for 1 week. Also having orthopnea and lower extremity edema. Echo from 04/2014 showing LVEF 65-70%, moderate LVH, mild aortic stenosis, mild mitral regurgitation, and increase PA pressure at 57 mmHg. Her symptoms are likely due to cor pulmonale. Patient does have +1 pitting edema in bilateral lower extremities. Her COPD could also possibly be contributing to her symptoms. Symptoms not likely due to L sided heart failure as lungs are clear on exam. CXR is not showing any acute  abnormality. In addition, patient is afebrile and does not have leukocytosis, making PNA less likely. Dyspnea not likely due to ACS as patient is not having any chest pain, EKG is not showing any acute ST/ T  wave changes, and Istat troponin negative. Not likely PE as wells score is 0.  -Lasix 40 mg IV once -Duoneb treatment -Solumedrol 125 mg IV once  -Patient is being discharged with Prednisone 20 mg BID for 5 days -She has been advised to follow-up with her PCP.   UTI  Patient reports having dysuria and was diagnosed with a UTI yesterday by her PCP. UA showing large amounts of leukocytes.  -IV Rocephin 1 g once  -Patient is being discharged with Cephalexin 500 mg BID  Shela Leff, MD 08/23/15 1306  Veryl Speak, MD 08/23/15 1539

## 2015-08-23 NOTE — ED Notes (Signed)
Per EMS- pt here from home c.o. COPD exacerbation starting last night. Pt saw her pulmonologist Monday. Pt c.o. Shortness of breathe with exertion/activity. Pt was 88% on her home 4L O2. Pt also states she is out of some of her medications. Pt currently being treated with antibiotics for a UTI.

## 2015-08-23 NOTE — ED Notes (Signed)
Patient transported to X-ray 

## 2015-08-23 NOTE — ED Notes (Signed)
Pt placed on bed pan for specimen. Brief changed.

## 2015-08-23 NOTE — ED Notes (Signed)
Changed patient's brief and linens.

## 2015-08-23 NOTE — ED Notes (Signed)
Pt ambulated with home O2 and maintained O2 sats 97%

## 2015-08-23 NOTE — Discharge Instructions (Signed)
Please take Prednisone as instructed.  Please take Cephalexin as instructed.   Shortness of Breath Shortness of breath means you have trouble breathing. It could also mean that you have a medical problem. You should get immediate medical care for shortness of breath. CAUSES   Not enough oxygen in the air such as with high altitudes or a smoke-filled room.  Certain lung diseases, infections, or problems.  Heart disease or conditions, such as angina or heart failure.  Low red blood cells (anemia).  Poor physical fitness, which can cause shortness of breath when you exercise.  Chest or back injuries or stiffness.  Being overweight.  Smoking.  Anxiety, which can make you feel like you are not getting enough air. DIAGNOSIS  Serious medical problems can often be found during your physical exam. Tests may also be done to determine why you are having shortness of breath. Tests may include:  Chest X-rays.  Lung function tests.  Blood tests.  An electrocardiogram (ECG).  An ambulatory electrocardiogram. An ambulatory ECG records your heartbeat patterns over a 24-hour period.  Exercise testing.  A transthoracic echocardiogram (TTE). During echocardiography, sound waves are used to evaluate how blood flows through your heart.  A transesophageal echocardiogram (TEE).  Imaging scans. Your health care provider may not be able to find a cause for your shortness of breath after your exam. In this case, it is important to have a follow-up exam with your health care provider as directed.  TREATMENT  Treatment for shortness of breath depends on the cause of your symptoms and can vary greatly. HOME CARE INSTRUCTIONS   Do not smoke. Smoking is a common cause of shortness of breath. If you smoke, ask for help to quit.  Avoid being around chemicals or things that may bother your breathing, such as paint fumes and dust.  Rest as needed. Slowly resume your usual activities.  If  medicines were prescribed, take them as directed for the full length of time directed. This includes oxygen and any inhaled medicines.  Keep all follow-up appointments as directed by your health care provider. SEEK MEDICAL CARE IF:   Your condition does not improve in the time expected.  You have a hard time doing your normal activities even with rest.  You have any new symptoms. SEEK IMMEDIATE MEDICAL CARE IF:   Your shortness of breath gets worse.  You feel light-headed, faint, or develop a cough not controlled with medicines.  You start coughing up blood.  You have pain with breathing.  You have chest pain or pain in your arms, shoulders, or abdomen.  You have a fever.  You are unable to walk up stairs or exercise the way you normally do. MAKE SURE YOU:  Understand these instructions.  Will watch your condition.  Will get help right away if you are not doing well or get worse.   This information is not intended to replace advice given to you by your health care provider. Make sure you discuss any questions you have with your health care provider.   Document Released: 11/24/2000 Document Revised: 03/06/2013 Document Reviewed: 05/17/2011 Elsevier Interactive Patient Education [employer].

## 2015-08-23 NOTE — ED Notes (Signed)
MD at bedside evaluating pt. Pt reports she has felt short of breathe for a week. Pt saw her Md on Monday. Pt reports cough some of the time.

## 2015-08-26 DIAGNOSIS — I350 Nonrheumatic aortic (valve) stenosis: Secondary | ICD-10-CM | POA: Diagnosis not present

## 2015-08-27 DIAGNOSIS — J449 Chronic obstructive pulmonary disease, unspecified: Secondary | ICD-10-CM | POA: Diagnosis not present

## 2015-08-30 ENCOUNTER — Emergency Department (HOSPITAL_COMMUNITY)
Admission: EM | Admit: 2015-08-30 | Discharge: 2015-08-30 | Disposition: A | Discharge status: Home / Self Care | Payer: Commercial Managed Care - HMO | Attending: Emergency Medicine | Admitting: Emergency Medicine

## 2015-08-30 ENCOUNTER — Emergency Department (HOSPITAL_COMMUNITY): Payer: Commercial Managed Care - HMO

## 2015-08-30 ENCOUNTER — Encounter (HOSPITAL_COMMUNITY): Payer: Self-pay

## 2015-08-30 DIAGNOSIS — Z7901 Long term (current) use of anticoagulants: Secondary | ICD-10-CM | POA: Diagnosis not present

## 2015-08-30 DIAGNOSIS — Z79899 Other long term (current) drug therapy: Secondary | ICD-10-CM | POA: Diagnosis not present

## 2015-08-30 DIAGNOSIS — N189 Chronic kidney disease, unspecified: Secondary | ICD-10-CM | POA: Diagnosis not present

## 2015-08-30 DIAGNOSIS — R0602 Shortness of breath: Secondary | ICD-10-CM | POA: Diagnosis not present

## 2015-08-30 DIAGNOSIS — N390 Urinary tract infection, site not specified: Secondary | ICD-10-CM | POA: Diagnosis not present

## 2015-08-30 DIAGNOSIS — J449 Chronic obstructive pulmonary disease, unspecified: Secondary | ICD-10-CM | POA: Diagnosis not present

## 2015-08-30 DIAGNOSIS — Z96611 Presence of right artificial shoulder joint: Secondary | ICD-10-CM | POA: Insufficient documentation

## 2015-08-30 DIAGNOSIS — I129 Hypertensive chronic kidney disease with stage 1 through stage 4 chronic kidney disease, or unspecified chronic kidney disease: Secondary | ICD-10-CM | POA: Insufficient documentation

## 2015-08-30 DIAGNOSIS — R069 Unspecified abnormalities of breathing: Secondary | ICD-10-CM | POA: Diagnosis not present

## 2015-08-30 DIAGNOSIS — R319 Hematuria, unspecified: Secondary | ICD-10-CM | POA: Diagnosis not present

## 2015-08-30 DIAGNOSIS — Z96641 Presence of right artificial hip joint: Secondary | ICD-10-CM | POA: Insufficient documentation

## 2015-08-30 DIAGNOSIS — Z87891 Personal history of nicotine dependence: Secondary | ICD-10-CM | POA: Diagnosis not present

## 2015-08-30 LAB — CBC WITH DIFFERENTIAL/PLATELET
BASOS ABS: 0 10*3/uL (ref 0.0–0.1)
BASOS PCT: 0 %
Eosinophils Absolute: 0.4 10*3/uL (ref 0.0–0.7)
Eosinophils Relative: 2 %
HEMATOCRIT: 43.7 % (ref 36.0–46.0)
HEMOGLOBIN: 13.4 g/dL (ref 12.0–15.0)
LYMPHS PCT: 9 %
Lymphs Abs: 1.8 10*3/uL (ref 0.7–4.0)
MCH: 27.1 pg (ref 26.0–34.0)
MCHC: 30.7 g/dL (ref 30.0–36.0)
MCV: 88.5 fL (ref 78.0–100.0)
MONO ABS: 1.8 10*3/uL — AB (ref 0.1–1.0)
MONOS PCT: 10 %
NEUTROS ABS: 14.8 10*3/uL — AB (ref 1.7–7.7)
NEUTROS PCT: 79 %
Platelets: 251 10*3/uL (ref 150–400)
RBC: 4.94 MIL/uL (ref 3.87–5.11)
RDW: 15.6 % — AB (ref 11.5–15.5)
WBC: 18.8 10*3/uL — ABNORMAL HIGH (ref 4.0–10.5)

## 2015-08-30 LAB — COMPREHENSIVE METABOLIC PANEL
ALBUMIN: 3.5 g/dL (ref 3.5–5.0)
ALK PHOS: 79 U/L (ref 38–126)
ALT: 29 U/L (ref 14–54)
AST: 21 U/L (ref 15–41)
Anion gap: 6 (ref 5–15)
BILIRUBIN TOTAL: 0.4 mg/dL (ref 0.3–1.2)
BUN: 20 mg/dL (ref 6–20)
CALCIUM: 9.3 mg/dL (ref 8.9–10.3)
CO2: 31 mmol/L (ref 22–32)
Chloride: 98 mmol/L — ABNORMAL LOW (ref 101–111)
Creatinine, Ser: 0.81 mg/dL (ref 0.44–1.00)
GFR calc Af Amer: >60 mL/min (ref >60)
GLUCOSE: 87 mg/dL (ref 65–99)
Potassium: 4.6 mmol/L (ref 3.5–5.1)
Sodium: 135 mmol/L (ref 135–145)
TOTAL PROTEIN: 6.7 g/dL (ref 6.5–8.1)

## 2015-08-30 LAB — URINALYSIS, ROUTINE W REFLEX MICROSCOPIC
BILIRUBIN URINE: NEGATIVE
GLUCOSE, UA: NEGATIVE mg/dL
KETONES UR: NEGATIVE mg/dL
Nitrite: NEGATIVE
PROTEIN: NEGATIVE mg/dL
Specific Gravity, Urine: 1.012 (ref 1.005–1.030)
pH: 7.5 (ref 5.0–8.0)

## 2015-08-30 LAB — I-STAT TROPONIN, ED
TROPONIN I, POC: 0 ng/mL (ref 0.00–0.08)
Troponin i, poc: 0 ng/mL (ref 0.00–0.08)

## 2015-08-30 LAB — URINE MICROSCOPIC-ADD ON

## 2015-08-30 LAB — LIPASE, BLOOD: Lipase: 23 U/L (ref 11–51)

## 2015-08-30 LAB — BRAIN NATRIURETIC PEPTIDE: B Natriuretic Peptide: 32.7 pg/mL (ref 0.0–100.0)

## 2015-08-30 MED ORDER — NITROFURANTOIN MONOHYD MACRO 100 MG PO CAPS: 100.0000 mg | ORAL_CAPSULE | Freq: Two times a day (BID) | ORAL | Status: AC

## 2015-08-30 MED ORDER — PHENAZOPYRIDINE HCL 200 MG PO TABS
200.0000 mg | ORAL_TABLET | Freq: Three times a day (TID) | ORAL | Status: DC
Start: 2015-08-30 — End: 2015-09-23

## 2015-08-30 NOTE — ED Notes (Signed)
Speaking with pt and son. Tank that pt arrived with is empty. [facility] contacted but states they will not bring tank because pt has concentrator and tanks at home. Pt informed of same and son states he cannot retrieve tank. Pt staes she feels she can go home without and MD aware of plan and OK with same. Pt and son eating lunch in room.

## 2015-08-30 NOTE — ED Notes (Signed)
Pt ambulated once around pod A w/ walker on 3L home O2; Lowest saturation 92%

## 2015-08-30 NOTE — Discharge Instructions (Signed)
Place bacitracin or neosporin over your wound and keep covered.  If redness develops around the area, return for reevaluation.  Follow up with your physician in 2 weeks regarding xarelto use, chest wound, regular care, and Urology (Dr. [name] as soon as possible)

## 2015-08-30 NOTE — ED Notes (Addendum)
Pt arrives EMS with c/o short of breath "for some time". Pt states she became short of breath "all of a sudden" this morning. Pt also c/o abdominal pain x 3 days and state her bowels move when she urinates.Pt also states she was passing blood in urine this am.

## 2015-08-30 NOTE — ED Provider Notes (Signed)
CSN: [id number]     Arrival date & time 08/30/15  0805 History   First MD Initiated Contact with Patient 08/30/15 [phone]     Chief Complaint  Patient presents with  . Shortness of Breath     (Consider location/radiation/quality/duration/timing/severity/associated sxs/prior Treatment) HPI   75yo female  Shortness of breath since last night, sudden after walking back from bathroom, no chest pain, no nausea, no diaphoresis. Uses 3L of O2 at home.  Bilateral leg swelling, worse than usual. Cough at baseline. Placed on prednisone last week for possible COPD exacerbation and given 1 dose of IV lasix in ED after which she improved. Dyspnea was worse last night, now resolved.  Was not helped by nebs last night.   Saw PCP, then went to ED 1 week ago, was diagnosed with UTI, yesterday began to see blood in urine, does not believe it is vaginal bleeding. Was on keflex but made her dizzy so switched to macrobid. No fevers. No back pain.  No melena, no loose stools  Irritation at site of had surgery in chest, skin irritation and breakdown  Past Medical History  Diagnosis Date  . Hypertension   . Shortness of breath   . Asthma   . COPD (chronic obstructive pulmonary disease) (HCC)     emphysema    . GERD (gastroesophageal reflux disease)   . Blood dyscrasia     hx blood clotts  . Hyperlipidemia   . Anemia   . Pneumothorax on right 09/2014  . On home oxygen therapy     "3L; 24/7" (11/21/2014)  . Pneumonia     hx pneumonia   . Chronic bronchitis ([place])   . Pulmonary embolism ([place])   . History of blood transfusion X 2    "related to blood thinner I was taking"  . [id number]     "a few times/year" (11/21/2014)  . Migraine     "weekly probably" (11/21/2014)  . Arthritis     "back" (11/21/2014)  . Chronic lower back pain   . Chronic kidney disease   . Urinary incontinence     Past Surgical History  Procedure Laterality Date  . Hemiarthroplasty shoulder fracture Right   . Cataract  extraction w/ intraocular lens  implant, bilateral Bilateral   . Orif wrist fracture  10/13/2011    Procedure: OPEN REDUCTION INTERNAL FIXATION (ORIF) WRIST FRACTURE;  Surgeon: [name], MD;  Location: [facility];  Service: Orthopedics;  Laterality: Right;  Open Reduction Internal Fixation Right Distal Radius   . Hardware removal  01/29/2012    Procedure: HARDWARE REMOVAL;  Surgeon: [name], MD;  Location: [facility];  Service: Orthopedics;  Laterality: Right;  . Hip arthroplasty  01/29/2012    Procedure: ARTHROPLASTY BIPOLAR HIP;  Surgeon: [name], MD;  Location: [facility];  Service: Orthopedics;  Laterality: Right;  . Transurethral resection of bladder tumor N/A 06/11/2014    Procedure: TRANSURETHRAL RESECTION OF BLADDER TUMOR (TURBT);  Surgeon: [name], MD;  Location: [facility];  Service: Urology;  Laterality: N/A;  . Cystoscopy w/ retrogrades Bilateral 06/11/2014    Procedure: CYSTOSCOPY WITH RETROGRADE PYELOGRAM;  Surgeon: [name], MD;  Location: [facility];  Service: Urology;  Laterality: Bilateral;  . Fracture surgery    . Laparoscopic cholecystectomy    . Tubal ligation    . Abdominal hysterectomy     Family History  Problem Relation Age of Onset  . Cancer - Lung Mother   . Coronary artery disease Mother   .  Coronary artery disease Sister   . Coronary artery disease Brother    Social History  Substance Use Topics  . Smoking status: Former Smoker -- 1.00 packs/day for 56 years    Types: Cigarettes    Quit date: 02/12/2014  . Smokeless tobacco: Never Used  . Alcohol Use: No   OB History    No data available     Review of Systems  Constitutional: Negative for fever.  HENT: Negative for sore throat.   Eyes: Negative for visual disturbance.  Respiratory: Positive for cough and shortness of breath.   Cardiovascular: Positive for leg swelling. Negative for chest pain.  Gastrointestinal: Negative for nausea, vomiting, abdominal pain, diarrhea and blood in stool.   Genitourinary: Positive for dysuria and hematuria. Negative for vaginal bleeding, vaginal discharge and difficulty urinating (incontinent if cant get to bathroom on time).  Musculoskeletal: Negative for back pain and neck pain.  Skin: Negative for rash.  Neurological: Negative for syncope and headaches.      Allergies  Eggs or egg-derived products  Home Medications   Prior to Admission medications   Medication Sig Start Date End Date Taking? Authorizing Provider  albuterol (PROVENTIL) (2.5 MG/3ML) 0.083% nebulizer solution Take 2.5 mg by nebulization every 6 (six) hours as needed for wheezing or shortness of breath.     Historical Provider, MD  atorvastatin (LIPITOR) 40 MG tablet Take 40 mg by mouth every morning.  07/02/13   Historical Provider, MD  budesonide-formoterol (SYMBICORT) 160-4.5 MCG/ACT inhaler Inhale 2 puffs into the lungs 2 (two) times daily. 08/06/13   Barton Dubois, MD  cephALEXin (KEFLEX) 500 MG capsule Take 1 capsule (500 mg total) by mouth 2 (two) times daily. 08/23/15   Shela Leff, MD  diltiazem (CARDIZEM CD) 240 MG 24 hr capsule Take 240 mg by mouth every morning.  04/08/14   Historical Provider, MD  docusate sodium (COLACE) 100 MG capsule Take 100 mg by mouth daily as needed (constipation).     Historical Provider, MD  Ferrous Sulfate (IRON) 325 (65 FE) MG TABS Take 325 mg by mouth daily.    Historical Provider, MD  fluticasone (FLONASE) 50 MCG/ACT nasal spray Place 1 spray into both nostrils daily as needed for allergies or rhinitis. 08/06/13   Barton Dubois, MD  furosemide (LASIX) 20 MG tablet Take 1 tablet (20 mg total) by mouth daily. 06/13/14   Theodis Blaze, MD  guaiFENesin (MUCINEX) 600 MG 12 hr tablet Take 600 mg by mouth 2 (two) times daily as needed for cough. Reported on 06/17/2015    Historical Provider, MD  metoprolol succinate (TOPROL-XL) 25 MG 24 hr tablet Take 12.5 mg by mouth every morning. 07/21/15   Historical Provider, MD  nitrofurantoin,  macrocrystal-monohydrate, (MACROBID) 100 MG capsule Take 1 capsule (100 mg total) by mouth 2 (two) times daily. 08/30/15 09/06/15  Gareth Morgan, MD  Omega-3 Fatty Acids (FISH OIL) 1000 MG CAPS Take 1,000 capsules by mouth daily.    Historical Provider, MD  oxybutynin (DITROPAN-XL) 5 MG 24 hr tablet Take 10 mg by mouth at bedtime.    Historical Provider, MD  pantoprazole (PROTONIX) 40 MG tablet Take 1 tablet (40 mg total) by mouth daily. Q000111Q   Delora Fuel, MD  phenazopyridine (PYRIDIUM) 200 MG tablet Take 1 tablet (200 mg total) by mouth 3 (three) times daily. 08/30/15   Gareth Morgan, MD  predniSONE (DELTASONE) 20 MG tablet Take 1 tablet (20 mg total) by mouth 2 (two) times daily with a meal. 08/23/15  Shela Leff, MD  rivaroxaban (XARELTO) 20 MG TABS tablet Take 20 mg by mouth daily with supper.    Historical Provider, MD  tiotropium (SPIRIVA) 18 MCG inhalation capsule Place 1 capsule (18 mcg total) into inhaler and inhale daily. 08/06/13   Barton Dubois, MD  traMADol (ULTRAM) 50 MG tablet Take 1 tablet (50 mg total) by mouth every 6 (six) hours as needed for moderate pain (pain). Patient taking differently: Take 50 mg by mouth at bedtime as needed for moderate pain (pain).  09/19/14   Donielle Liston Alba, PA-C  VENTOLIN HFA 108 (90 BASE) MCG/ACT inhaler Inhale 2 puffs into the lungs 4 (four) times daily as needed for wheezing or shortness of breath.  06/07/14   Historical Provider, MD  vitamin B-12 (CYANOCOBALAMIN) 500 MCG tablet Take 1,000 mcg by mouth daily.     Historical Provider, MD   BP 124/83 mmHg  Pulse 83  Temp(Src) 98.6 F (37 C) (Oral)  Resp 20  Ht 5\' 5"  (1.651 m)  Wt 134 lb (60.782 kg)  BMI 22.30 kg/m2  SpO2 99% Physical Exam  Constitutional: She is oriented to person, place, and time. She appears well-developed and well-nourished. No distress.  HENT:  Head: Normocephalic and atraumatic.  Eyes: Conjunctivae and EOM are normal.  Neck: Normal range of motion. No JVD  present.  Cardiovascular: Normal rate, regular rhythm, normal heart sounds and intact distal pulses.  Exam reveals no gallop and no friction rub.   No murmur heard. Pulmonary/Chest: Effort normal. No respiratory distress. She has decreased breath sounds (throughout). She has no wheezes. She has no rales. She exhibits tenderness (ulceration between breast 5cm linear ).  Abdominal: Soft. She exhibits no distension. There is no tenderness. There is no guarding.  Genitourinary:  No rash   Musculoskeletal: She exhibits edema. She exhibits no tenderness.  Neurological: She is alert and oriented to person, place, and time.  Skin: Skin is warm and dry. No rash noted. She is not diaphoretic. No erythema.  Nursing note and vitals reviewed.   ED Course  Procedures (including critical care time) Labs Review Labs Reviewed  CBC WITH DIFFERENTIAL/PLATELET - Abnormal; Notable for the following:    WBC 18.8 (*)    RDW 15.6 (*)    Neutro Abs 14.8 (*)    Monocytes Absolute 1.8 (*)    All other components within normal limits  COMPREHENSIVE METABOLIC PANEL - Abnormal; Notable for the following:    Chloride 98 (*)    All other components within normal limits  URINALYSIS, ROUTINE W REFLEX MICROSCOPIC (NOT AT [facility]) - Abnormal; Notable for the following:    APPearance CLOUDY (*)    Hgb urine dipstick LARGE (*)    Leukocytes, UA MODERATE (*)    All other components within normal limits  URINE MICROSCOPIC-ADD ON - Abnormal; Notable for the following:    Squamous Epithelial / LPF 0-5 (*)    Bacteria, UA RARE (*)    All other components within normal limits  URINE CULTURE  LIPASE, BLOOD  BRAIN NATRIURETIC PEPTIDE  I-STAT TROPOININ, ED  Randolm Idol, ED    Imaging Review Dg Chest 2 View  08/30/2015  CLINICAL DATA:  Shortness of breath. EXAM: CHEST  2 VIEW COMPARISON:  Radiograph of August 23, 2015. FINDINGS: The heart size and mediastinal contours are within normal limits. No pneumothorax or  pleural effusion is noted. Left lung is clear. Stable right apical scarring and postoperative change is noted. No acute pulmonary disease is noted. Atherosclerosis  of thoracic aorta is noted. The visualized skeletal structures are unremarkable. IMPRESSION: No active cardiopulmonary disease. Electronically Signed   By: Marijo Conception, M.D.   On: 08/30/2015 09:32   I have personally reviewed and evaluated these images and lab results as part of my medical decision-making.   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  Differential diagnosis for dyspnea includes ACS, PE, COPD exacerbation, CHF exacerbation, anemia, pneumonia.  Chest x-ray was done which showed no sign of pneumonia, no pneumotorax. EKG was evaluated by me which showed sinus rhythm unchanged from prior.  BNP was 32, decreased from 135 one week ago, while pt has leg swelling, do not feel symptoms from CHF exacerbation.  Patient is not feeling dyspnea at this time, has normal respiratory rate, normal oxygenation (even when off her home O2), is taking xarelto, and doubt PE.  No wheezing, no sputum production and doubt COPD exacerbation. Leukocytosis likely secondary to prednisone. Troponin negative x2, doubt ACS.  Pt without dyspnea, no tachypnea, no hypoxia at rest or with ambulation with hours of observation in ED.  Regarding concern of hematuria, this may be secondary to xarelto use, infection or recurrent ?bladder mass.  Patient without drop in hemoglobin, able to urinate  without obstruction.  UA difficult to assess for infection given presence of blood, however given possibility, will extend pt macrobid rx for 7 days. She has no symptoms to suggest pyelonephritis. Given rx for pyridium. Recommend close Urology follow up with Dr. [name] given hx of hematuria, medical history.   Pt with pressure ulcer between breasts (pt with symmastia) without signs of ulceration, no sign of candidal infection. Recommend bacitracin, close follow up with PCP and monitoring for signs of skin infection.  Patient discharged in stable condition with understanding of reasons to return.     Gareth Morgan, MD 08/30/15 2130

## 2015-08-30 NOTE — ED Notes (Signed)
MD at bedside. 

## 2015-08-31 LAB — URINE CULTURE: Culture: NO GROWTH

## 2015-09-03 ENCOUNTER — Ambulatory Visit: Payer: Commercial Managed Care - HMO

## 2015-09-09 DIAGNOSIS — H905 Unspecified sensorineural hearing loss: Secondary | ICD-10-CM | POA: Diagnosis not present

## 2015-09-23 ENCOUNTER — Observation Stay (HOSPITAL_COMMUNITY)
Admission: EM | Admit: 2015-09-23 | Discharge: 2015-09-24 | Disposition: A | Discharge status: Home / Self Care | Payer: Commercial Managed Care - HMO | Attending: Internal Medicine | Admitting: Internal Medicine

## 2015-09-23 ENCOUNTER — Emergency Department (HOSPITAL_COMMUNITY): Payer: Commercial Managed Care - HMO

## 2015-09-23 ENCOUNTER — Encounter (HOSPITAL_COMMUNITY): Payer: Self-pay | Admitting: *Deleted

## 2015-09-23 DIAGNOSIS — F419 Anxiety disorder, unspecified: Secondary | ICD-10-CM | POA: Insufficient documentation

## 2015-09-23 DIAGNOSIS — I2782 Chronic pulmonary embolism: Secondary | ICD-10-CM | POA: Diagnosis not present

## 2015-09-23 DIAGNOSIS — R319 Hematuria, unspecified: Secondary | ICD-10-CM | POA: Diagnosis present

## 2015-09-23 DIAGNOSIS — I129 Hypertensive chronic kidney disease with stage 1 through stage 4 chronic kidney disease, or unspecified chronic kidney disease: Secondary | ICD-10-CM | POA: Insufficient documentation

## 2015-09-23 DIAGNOSIS — Z9981 Dependence on supplemental oxygen: Secondary | ICD-10-CM | POA: Insufficient documentation

## 2015-09-23 DIAGNOSIS — I2510 Atherosclerotic heart disease of native coronary artery without angina pectoris: Secondary | ICD-10-CM | POA: Diagnosis not present

## 2015-09-23 DIAGNOSIS — R531 Weakness: Secondary | ICD-10-CM | POA: Diagnosis not present

## 2015-09-23 DIAGNOSIS — R32 Unspecified urinary incontinence: Secondary | ICD-10-CM | POA: Diagnosis not present

## 2015-09-23 DIAGNOSIS — K219 Gastro-esophageal reflux disease without esophagitis: Secondary | ICD-10-CM | POA: Insufficient documentation

## 2015-09-23 DIAGNOSIS — Z87891 Personal history of nicotine dependence: Secondary | ICD-10-CM | POA: Insufficient documentation

## 2015-09-23 DIAGNOSIS — R0602 Shortness of breath: Secondary | ICD-10-CM | POA: Diagnosis not present

## 2015-09-23 DIAGNOSIS — I480 Paroxysmal atrial fibrillation: Secondary | ICD-10-CM | POA: Diagnosis not present

## 2015-09-23 DIAGNOSIS — E785 Hyperlipidemia, unspecified: Secondary | ICD-10-CM | POA: Diagnosis not present

## 2015-09-23 DIAGNOSIS — N189 Chronic kidney disease, unspecified: Secondary | ICD-10-CM | POA: Insufficient documentation

## 2015-09-23 DIAGNOSIS — N3091 Cystitis, unspecified with hematuria: Secondary | ICD-10-CM | POA: Insufficient documentation

## 2015-09-23 DIAGNOSIS — R0600 Dyspnea, unspecified: Secondary | ICD-10-CM

## 2015-09-23 DIAGNOSIS — I10 Essential (primary) hypertension: Secondary | ICD-10-CM | POA: Diagnosis present

## 2015-09-23 DIAGNOSIS — M1990 Unspecified osteoarthritis, unspecified site: Secondary | ICD-10-CM | POA: Insufficient documentation

## 2015-09-23 DIAGNOSIS — R0902 Hypoxemia: Secondary | ICD-10-CM | POA: Diagnosis not present

## 2015-09-23 DIAGNOSIS — D696 Thrombocytopenia, unspecified: Secondary | ICD-10-CM | POA: Diagnosis not present

## 2015-09-23 DIAGNOSIS — J441 Chronic obstructive pulmonary disease with (acute) exacerbation: Principal | ICD-10-CM | POA: Insufficient documentation

## 2015-09-23 DIAGNOSIS — D649 Anemia, unspecified: Secondary | ICD-10-CM | POA: Diagnosis not present

## 2015-09-23 DIAGNOSIS — Z7901 Long term (current) use of anticoagulants: Secondary | ICD-10-CM | POA: Diagnosis not present

## 2015-09-23 DIAGNOSIS — Z86718 Personal history of other venous thrombosis and embolism: Secondary | ICD-10-CM | POA: Insufficient documentation

## 2015-09-23 DIAGNOSIS — R062 Wheezing: Secondary | ICD-10-CM | POA: Diagnosis not present

## 2015-09-23 LAB — TROPONIN I

## 2015-09-23 LAB — URINALYSIS, ROUTINE W REFLEX MICROSCOPIC
BILIRUBIN URINE: NEGATIVE
GLUCOSE, UA: NEGATIVE mg/dL
Ketones, ur: NEGATIVE mg/dL
Nitrite: NEGATIVE
PH: 6.5 (ref 5.0–8.0)
Protein, ur: 100 mg/dL — AB
SPECIFIC GRAVITY, URINE: 1.019 (ref 1.005–1.030)

## 2015-09-23 LAB — BASIC METABOLIC PANEL
Anion gap: 8 (ref 5–15)
BUN: 22 mg/dL — AB (ref 6–20)
CO2: 30 mmol/L (ref 22–32)
CREATININE: 0.75 mg/dL (ref 0.44–1.00)
Calcium: 9.1 mg/dL (ref 8.9–10.3)
Chloride: 103 mmol/L (ref 101–111)
Glucose, Bld: 107 mg/dL — ABNORMAL HIGH (ref 65–99)
POTASSIUM: 4.2 mmol/L (ref 3.5–5.1)
SODIUM: 141 mmol/L (ref 135–145)

## 2015-09-23 LAB — CBC
HEMATOCRIT: 42.6 % (ref 36.0–46.0)
Hemoglobin: 12.3 g/dL (ref 12.0–15.0)
MCH: 27.4 pg (ref 26.0–34.0)
MCHC: 28.9 g/dL — ABNORMAL LOW (ref 30.0–36.0)
MCV: 94.9 fL (ref 78.0–100.0)
PLATELETS: 194 10*3/uL (ref 150–400)
RBC: 4.49 MIL/uL (ref 3.87–5.11)
RDW: 15.9 % — AB (ref 11.5–15.5)
WBC: 10.2 10*3/uL (ref 4.0–10.5)

## 2015-09-23 LAB — URINE MICROSCOPIC-ADD ON

## 2015-09-23 MED ORDER — FLUTICASONE PROPIONATE 50 MCG/ACT NA SUSP: 1.0000 | Freq: Every day | NASAL | Status: DC | PRN

## 2015-09-23 MED ORDER — ALBUTEROL SULFATE (2.5 MG/3ML) 0.083% IN NEBU
5.0000 mg | INHALATION_SOLUTION | Freq: Once | RESPIRATORY_TRACT | Status: AC
  Administered 2015-09-23: 5 mg via RESPIRATORY_TRACT
  Filled 2015-09-23: qty 6

## 2015-09-23 MED ORDER — ACETAMINOPHEN 650 MG RE SUPP: 650.0000 mg | Freq: Four times a day (QID) | RECTAL | Status: DC | PRN

## 2015-09-23 MED ORDER — IPRATROPIUM BROMIDE 0.02 % IN SOLN
0.5000 mg | Freq: Once | RESPIRATORY_TRACT | Status: AC
  Administered 2015-09-23: 0.5 mg via RESPIRATORY_TRACT
  Filled 2015-09-23: qty 2.5

## 2015-09-23 MED ORDER — METOPROLOL SUCCINATE ER 25 MG PO TB24
12.5000 mg | ORAL_TABLET | Freq: Every morning | ORAL | Status: DC
  Administered 2015-09-24: 12.5 mg via ORAL
  Filled 2015-09-23: qty 1

## 2015-09-23 MED ORDER — GUAIFENESIN ER 600 MG PO TB12: 600.0000 mg | ORAL_TABLET | Freq: Two times a day (BID) | ORAL | Status: DC | PRN

## 2015-09-23 MED ORDER — ATORVASTATIN CALCIUM 40 MG PO TABS
40.0000 mg | ORAL_TABLET | Freq: Every morning | ORAL | Status: DC
  Administered 2015-09-24: 40 mg via ORAL
  Filled 2015-09-23: qty 1

## 2015-09-23 MED ORDER — IPRATROPIUM-ALBUTEROL 0.5-2.5 (3) MG/3ML IN SOLN
3.0000 mL | Freq: Four times a day (QID) | RESPIRATORY_TRACT | Status: DC
  Administered 2015-09-23 – 2015-09-24 (×3): 3 mL via RESPIRATORY_TRACT
  Filled 2015-09-23 (×4): qty 3

## 2015-09-23 MED ORDER — MOMETASONE FURO-FORMOTEROL FUM 200-5 MCG/ACT IN AERO: 2.0000 | INHALATION_SPRAY | Freq: Two times a day (BID) | RESPIRATORY_TRACT | Status: DC

## 2015-09-23 MED ORDER — DOCUSATE SODIUM 100 MG PO CAPS: 100.0000 mg | ORAL_CAPSULE | Freq: Every day | ORAL | Status: DC | PRN

## 2015-09-23 MED ORDER — SODIUM CHLORIDE 0.9% FLUSH
3.0000 mL | Freq: Two times a day (BID) | INTRAVENOUS | Status: DC
  Administered 2015-09-23: 3 mL via INTRAVENOUS

## 2015-09-23 MED ORDER — ALBUTEROL SULFATE (2.5 MG/3ML) 0.083% IN NEBU: 3.0000 mL | INHALATION_SOLUTION | Freq: Four times a day (QID) | RESPIRATORY_TRACT | Status: DC | PRN

## 2015-09-23 MED ORDER — PREDNISONE 20 MG PO TABS: 40.0000 mg | ORAL_TABLET | Freq: Every day | ORAL | Status: DC

## 2015-09-23 MED ORDER — SODIUM CHLORIDE 0.9% FLUSH: 3.0000 mL | INTRAVENOUS | Status: DC | PRN

## 2015-09-23 MED ORDER — SODIUM CHLORIDE 0.9 % IV SOLN: 250.0000 mL | INTRAVENOUS | Status: DC | PRN

## 2015-09-23 MED ORDER — METHYLPREDNISOLONE SODIUM SUCC 125 MG IJ SOLR
80.0000 mg | Freq: Once | INTRAMUSCULAR | Status: AC
  Administered 2015-09-23: 80 mg via INTRAVENOUS
  Filled 2015-09-23: qty 2

## 2015-09-23 MED ORDER — PREDNISONE 20 MG PO TABS
40.0000 mg | ORAL_TABLET | Freq: Every day | ORAL | Status: DC
  Administered 2015-09-24: 40 mg via ORAL
  Filled 2015-09-23: qty 2

## 2015-09-23 MED ORDER — RIVAROXABAN 20 MG PO TABS
20.0000 mg | ORAL_TABLET | Freq: Every day | ORAL | Status: DC
  Administered 2015-09-23: 20 mg via ORAL
  Filled 2015-09-23: qty 1

## 2015-09-23 MED ORDER — ACETAMINOPHEN 325 MG PO TABS
650.0000 mg | ORAL_TABLET | Freq: Four times a day (QID) | ORAL | Status: DC | PRN
  Administered 2015-09-24: 650 mg via ORAL
  Filled 2015-09-23: qty 2

## 2015-09-23 MED ORDER — OXYBUTYNIN CHLORIDE ER 10 MG PO TB24
10.0000 mg | ORAL_TABLET | Freq: Every day | ORAL | Status: DC
  Administered 2015-09-23: 10 mg via ORAL
  Filled 2015-09-23 (×2): qty 1

## 2015-09-23 MED ORDER — DILTIAZEM HCL ER COATED BEADS 240 MG PO CP24
240.0000 mg | ORAL_CAPSULE | Freq: Every morning | ORAL | Status: DC
  Administered 2015-09-24: 240 mg via ORAL
  Filled 2015-09-23: qty 1

## 2015-09-23 MED ORDER — ALBUTEROL SULFATE (2.5 MG/3ML) 0.083% IN NEBU: 5.0000 mg | INHALATION_SOLUTION | Freq: Once | RESPIRATORY_TRACT | Status: DC

## 2015-09-23 MED ORDER — ALBUTEROL SULFATE (2.5 MG/3ML) 0.083% IN NEBU: 5.0000 mg | INHALATION_SOLUTION | RESPIRATORY_TRACT | Status: DC | PRN

## 2015-09-23 MED ORDER — PANTOPRAZOLE SODIUM 40 MG PO TBEC
40.0000 mg | DELAYED_RELEASE_TABLET | Freq: Every day | ORAL | Status: DC
  Administered 2015-09-24: 40 mg via ORAL
  Filled 2015-09-23: qty 1

## 2015-09-23 NOTE — ED Provider Notes (Signed)
CSN: [id number]     Arrival date & time 09/23/15  1203 History   First MD Initiated Contact with Patient 09/23/15 1221     Chief Complaint  Patient presents with  . Shortness of Breath     (Consider location/radiation/quality/duration/timing/severity/associated sxs/prior Treatment) Patient is a 75 y.o. female presenting with shortness of breath. The history is provided by the patient and the EMS personnel.  Shortness of Breath Associated symptoms: no abdominal pain, no chest pain, no fever, no headaches, no neck pain, no rash, no sore throat and no vomiting   Patient with hx copd, c/o increased sob in the past 1-2 weeks. Occasional non prod cough, nasal congestion, and sneezing. No fever or chills. No chest pain. Sob constant, persistent, worse w exertion. No leg swelling. No orthopnea or pnd. Compliant w home meds, no recent change in meds. Using home neb 4x/day per normal routine, and home meds.  Mild relief w albuterol.       Past Medical History  Diagnosis Date  . Hypertension   . Shortness of breath   . Asthma   . COPD (chronic obstructive pulmonary disease) (HCC)     emphysema    . GERD (gastroesophageal reflux disease)   . Blood dyscrasia     hx blood clotts  . Hyperlipidemia   . Anemia   . Pneumothorax on right 09/2014  . On home oxygen therapy     "3L; 24/7" (11/21/2014)  . Pneumonia     hx pneumonia   . Chronic bronchitis ([place])   . Pulmonary embolism ([place])   . History of blood transfusion X 2    "related to blood thinner I was taking"  . [id number])     "a few times/year" (11/21/2014)  . Migraine     "weekly probably" (11/21/2014)  . Arthritis     "back" (11/21/2014)  . Chronic lower back pain   . Chronic kidney disease   . Urinary incontinence    Past Surgical History  Procedure Laterality Date  . Hemiarthroplasty shoulder fracture Right   . Cataract extraction w/ intraocular lens  implant, bilateral Bilateral   . Orif wrist fracture  10/13/2011     Procedure: OPEN REDUCTION INTERNAL FIXATION (ORIF) WRIST FRACTURE;  Surgeon: [name], MD;  Location: [facility];  Service: Orthopedics;  Laterality: Right;  Open Reduction Internal Fixation Right Distal Radius   . Hardware removal  01/29/2012    Procedure: HARDWARE REMOVAL;  Surgeon: [name], MD;  Location: [facility];  Service: Orthopedics;  Laterality: Right;  . Hip arthroplasty  01/29/2012    Procedure: ARTHROPLASTY BIPOLAR HIP;  Surgeon: [name], MD;  Location: [facility];  Service: Orthopedics;  Laterality: Right;  . Transurethral resection of bladder tumor N/A 06/11/2014    Procedure: TRANSURETHRAL RESECTION OF BLADDER TUMOR (TURBT);  Surgeon: [name], MD;  Location[facility];  Service: Urology;  Laterality: N/A;  . Cystoscopy w/ retrogrades Bilateral 06/11/2014    Procedure: CYSTOSCOPY WITH RETROGRADE PYELOGRAM;  Surgeon: [name], MD;  Location[facility];  Service: Urology;  Laterality: Bilateral;  . Fracture surgery    . Laparoscopic cholecystectomy    . Tubal ligation    . Abdominal hysterectomy     Family History  Problem Relation Age of Onset  . Cancer - Lung Mother   . Coronary artery disease Mother   . Coronary artery disease Sister   . Coronary artery disease Brother    Social History  Substance Use Topics  .  Smoking status: Former Smoker -- 1.00 packs/day for 56 years    Types: Cigarettes    Quit date: 02/12/2014  . Smokeless tobacco: Never Used  . Alcohol Use: No   OB History    No data available     Review of Systems  Constitutional: Negative for fever and chills.  HENT: Negative for sore throat.   Eyes: Negative for redness.  Respiratory: Positive for shortness of breath.   Cardiovascular: Negative for chest pain and leg swelling.  Gastrointestinal: Negative for vomiting, abdominal pain and diarrhea.  Genitourinary: Negative for dysuria and flank pain.  Musculoskeletal: Negative for back pain and neck pain.  Skin: Negative for rash.   Neurological: Negative for headaches.  Hematological: Does not bruise/bleed easily.  Psychiatric/Behavioral: Negative for confusion.      Allergies  Eggs or egg-derived products  Home Medications   Prior to Admission medications   Medication Sig Start Date End Date Taking? Authorizing Provider  albuterol (PROVENTIL) (2.5 MG/3ML) 0.083% nebulizer solution Take 2.5 mg by nebulization every 6 (six) hours as needed for wheezing or shortness of breath.     Historical Provider, MD  atorvastatin (LIPITOR) 40 MG tablet Take 40 mg by mouth every morning.  07/02/13   Historical Provider, MD  budesonide-formoterol (SYMBICORT) 160-4.5 MCG/ACT inhaler Inhale 2 puffs into the lungs 2 (two) times daily. 08/06/13   Barton Dubois, MD  cephALEXin (KEFLEX) 500 MG capsule Take 1 capsule (500 mg total) by mouth 2 (two) times daily. 08/23/15   Shela Leff, MD  diltiazem (CARDIZEM CD) 240 MG 24 hr capsule Take 240 mg by mouth every morning.  04/08/14   Historical Provider, MD  docusate sodium (COLACE) 100 MG capsule Take 100 mg by mouth daily as needed (constipation).     Historical Provider, MD  Ferrous Sulfate (IRON) 325 (65 FE) MG TABS Take 325 mg by mouth daily.    Historical Provider, MD  fluticasone (FLONASE) 50 MCG/ACT nasal spray Place 1 spray into both nostrils daily as needed for allergies or rhinitis. 08/06/13   Barton Dubois, MD  furosemide (LASIX) 20 MG tablet Take 1 tablet (20 mg total) by mouth daily. 06/13/14   Theodis Blaze, MD  guaiFENesin (MUCINEX) 600 MG 12 hr tablet Take 600 mg by mouth 2 (two) times daily as needed for cough. Reported on 06/17/2015    Historical Provider, MD  metoprolol succinate (TOPROL-XL) 25 MG 24 hr tablet Take 12.5 mg by mouth every morning. 07/21/15   Historical Provider, MD  Omega-3 Fatty Acids (FISH OIL) 1000 MG CAPS Take 1,000 capsules by mouth daily.    Historical Provider, MD  oxybutynin (DITROPAN-XL) 5 MG 24 hr tablet Take 10 mg by mouth at bedtime.    Historical  Provider, MD  pantoprazole (PROTONIX) 40 MG tablet Take 1 tablet (40 mg total) by mouth daily. Q000111Q   Delora Fuel, MD  phenazopyridine (PYRIDIUM) 200 MG tablet Take 1 tablet (200 mg total) by mouth 3 (three) times daily. 08/30/15   Gareth Morgan, MD  predniSONE (DELTASONE) 20 MG tablet Take 1 tablet (20 mg total) by mouth 2 (two) times daily with a meal. 08/23/15   Shela Leff, MD  rivaroxaban (XARELTO) 20 MG TABS tablet Take 20 mg by mouth daily with supper.    Historical Provider, MD  tiotropium (SPIRIVA) 18 MCG inhalation capsule Place 1 capsule (18 mcg total) into inhaler and inhale daily. 08/06/13   Barton Dubois, MD  traMADol (ULTRAM) 50 MG tablet Take 1 tablet (50 mg  total) by mouth every 6 (six) hours as needed for moderate pain (pain). Patient taking differently: Take 50 mg by mouth at bedtime as needed for moderate pain (pain).  09/19/14   Donielle Liston Alba, PA-C  VENTOLIN HFA 108 (90 BASE) MCG/ACT inhaler Inhale 2 puffs into the lungs 4 (four) times daily as needed for wheezing or shortness of breath.  06/07/14   Historical Provider, MD  vitamin B-12 (CYANOCOBALAMIN) 500 MCG tablet Take 1,000 mcg by mouth daily.     Historical Provider, MD   BP 144/59 mmHg  Pulse 88  Temp(Src) 98 F (36.7 C) (Oral)  Resp 16  SpO2 96% Physical Exam  Constitutional: She appears well-developed and well-nourished. No distress.  HENT:  Mouth/Throat: Oropharynx is clear and moist.  Eyes: Conjunctivae are normal. No scleral icterus.  Neck: Neck supple. No tracheal deviation present.  Cardiovascular: Normal rate, regular rhythm, normal heart sounds and intact distal pulses.   No murmur heard. Pulmonary/Chest: Effort normal. No respiratory distress. She has wheezes.  Abdominal: Soft. Normal appearance and bowel sounds are normal. She exhibits no distension. There is no tenderness.  Musculoskeletal: She exhibits no edema or tenderness.  Neurological: She is alert.  Skin: Skin is warm and dry. No  rash noted. She is not diaphoretic.  Psychiatric: She has a normal mood and affect.  Nursing note and vitals reviewed.   ED Course  Procedures (including critical care time) Labs Review   Results for orders placed or performed during the hospital encounter of 09/23/15  Troponin I  Result Value Ref Range   Troponin I <0.03 <0.03 ng/mL  Basic metabolic panel  Result Value Ref Range   Sodium 141 135 - 145 mmol/L   Potassium 4.2 3.5 - 5.1 mmol/L   Chloride 103 101 - 111 mmol/L   CO2 30 22 - 32 mmol/L   Glucose, Bld 107 (H) 65 - 99 mg/dL   BUN 22 (H) 6 - 20 mg/dL   Creatinine, Ser 0.75 0.44 - 1.00 mg/dL   Calcium 9.1 8.9 - 10.3 mg/dL   GFR calc non Af Amer >60 >60 mL/min   GFR calc Af Amer >60 >60 mL/min   Anion gap 8 5 - 15  CBC  Result Value Ref Range   WBC 10.2 4.0 - 10.5 K/uL   RBC 4.49 3.87 - 5.11 MIL/uL   Hemoglobin 12.3 12.0 - 15.0 g/dL   HCT 42.6 36.0 - 46.0 %   MCV 94.9 78.0 - 100.0 fL   MCH 27.4 26.0 - 34.0 pg   MCHC 28.9 (L) 30.0 - 36.0 g/dL   RDW 15.9 (H) 11.5 - 15.5 %   Platelets 194 150 - 400 K/uL   Dg Chest 2 View  09/23/2015  CLINICAL DATA:  Shortness of breath and wheezing for the past 2 weeks. EXAM: CHEST  2 VIEW COMPARISON:  08/30/2015. FINDINGS: Normal sized heart. Clear lungs. Right upper lobe surgical staples and apical blebs. The lungs remain mildly hyperexpanded with mildly prominent interstitial markings. Diffuse osteopenia. IMPRESSION: 1. No acute abnormality. 2. Stable mild changes of COPD. Electronically Signed   By: Claudie Revering M.D.   On: 09/23/2015 13:49   Dg Chest 2 View  08/30/2015  CLINICAL DATA:  Shortness of breath. EXAM: CHEST  2 VIEW COMPARISON:  Radiograph of August 23, 2015. FINDINGS: The heart size and mediastinal contours are within normal limits. No pneumothorax or pleural effusion is noted. Left lung is clear. Stable right apical scarring and postoperative change is noted.  No acute pulmonary disease is noted. Atherosclerosis of thoracic  aorta is noted. The visualized skeletal structures are unremarkable. IMPRESSION: No active cardiopulmonary disease. Electronically Signed   By: Marijo Conception, M.D.   On: 08/30/2015 09:32      I have personally reviewed and evaluated these images and lab results as part of my medical decision-making.   EKG Interpretation   Date/Time:  Tuesday September 23 2015 12:14:52 EDT Ventricular Rate:  81 PR Interval:    QRS Duration: 95 QT Interval:  367 QTC Calculation: 426 R Axis:   67 Text Interpretation:  Sinus rhythm Probable left ventricular hypertrophy  No significant change since last tracing Confirmed by Bobbe Quilter  MD, Lennette Bihari  ([id number]) on 09/23/2015 12:18:39 PM      MDM   Iv ns. o2 [place].  Albuterol and atrovent neb.  Reviewed nursing notes and prior charts for additional history.   Additional alb and atrovent neb. Solumedrol iv.  Dyspnea improved, but persists.   On getting to beside commode, or trying to ambulate, dyspnea worsens with pulse ox in 80s despite oxygen.   Patient with recent ED visit for similar symptoms, but feels worse now.   Hematuria persists. Will culture. Will need urology f/u.       Lajean Saver, MD 09/23/15 1501

## 2015-09-23 NOTE — ED Notes (Signed)
Pt in from home via [place], pt c/o SOB x 2 wks, pt had hospitalization x 1 mth ago for similar symptoms, pt denies n/v/d & CP, pt A&O x4, pt uses O2 2L at home [place], EMS reports extensive amt of O2 tubing being used at home, pt 88% on 2L upon EMS arrival, pt rcvd Duoneb & O2 sats improved to 98%, pt A&O x4

## 2015-09-23 NOTE — ED Notes (Signed)
Pt used bedside commode. Urine appearance- Dark red bloody. Notified RN

## 2015-09-23 NOTE — ED Notes (Signed)
While ambulating down hall Pt O2 dropped from 94 to 89. Pt stated she felt weak and was going to fall. Pt sat down immediately .

## 2015-09-23 NOTE — ED Notes (Signed)
Pt eating sandwich, will wait on breathing treatment per pt's request

## 2015-09-23 NOTE — H&P (Signed)
Date: 09/23/2015               Patient Name:  [name] [name] MRN: [id number]  DOB: 26-Oct-1940 Age / Sex: 75 y.o., female   PCP: Leonides Sake, MD              Medical Service: Internal Medicine Teaching Service              Attending Physician: Dr. Oval Linsey, MD    First Contact: Melissa Montane, MS 4 Pager: [phone]  Second Contact: Dr. Arcelia Jew Pager: [phone]            After Hours (After 5p/  First Contact Pager: [phone]  weekends / holidays): Second Contact Pager: [phone]   Chief Complaint:   History of Present Illness:  [name] [name] is a 75 yo F with a hx of COPD, HTN, PE, HLD, arthritis and CKD who presents with increasing SOB for the past 1-2 weeks.  Her SOB has been getting worse over the last two weeks especially when she exerts herself, and she does not feel like her breathing treatments alleviate her symptoms.  She believes she had a cold right before the onset of her SOB.  At home, patient is on 2L of oxygen and uses nebulized albuterol whenever she feels wheezy or SOB.  She has been bringing up some clear mucous when she coughs, but the quantity and color have not changed from her baseline.  She complains of an occasional cough, but denies fevers, chills, or chest pain.  She denies sudden onset of SOB, leg swelling, or hemoptysis, and she is on Xarelto since her past PE. She has a 100-150 pack year smoking history but she quit 4 years ago.   Patient lives alone and states that she is not able to complete several of her ADLs anymore.   Meds: Current Facility-Administered Medications  Medication Dose Route Frequency Provider Last Rate Last Dose  . albuterol (PROVENTIL) (2.5 MG/3ML) 0.083% nebulizer solution 5 mg  5 mg Nebulization Q4H PRN Lajean Saver, MD       Current Outpatient Prescriptions  Medication Sig Dispense Refill  . albuterol (PROVENTIL) (2.5 MG/3ML) 0.083% nebulizer solution Take 2.5 mg by nebulization every 6 (six) hours as needed for wheezing or shortness  of breath.     Marland Kitchen atorvastatin (LIPITOR) 40 MG tablet Take 40 mg by mouth every morning.     . budesonide-formoterol (SYMBICORT) 160-4.5 MCG/ACT inhaler Inhale 2 puffs into the lungs 2 (two) times daily. 1 Inhaler 12  . cephALEXin (KEFLEX) 500 MG capsule Take 1 capsule (500 mg total) by mouth 2 (two) times daily. 14 capsule 0  . diltiazem (CARDIZEM CD) 240 MG 24 hr capsule Take 240 mg by mouth every morning.   1  . docusate sodium (COLACE) 100 MG capsule Take 100 mg by mouth daily as needed (constipation).     . Ferrous Sulfate (IRON) 325 (65 FE) MG TABS Take 325 mg by mouth daily.    . fluticasone (FLONASE) 50 MCG/ACT nasal spray Place 1 spray into both nostrils daily as needed for allergies or rhinitis.    . furosemide (LASIX) 20 MG tablet Take 1 tablet (20 mg total) by mouth daily. 30 tablet 1  . guaiFENesin (MUCINEX) 600 MG 12 hr tablet Take 600 mg by mouth 2 (two) times daily as needed for cough. Reported on 06/17/2015    . metoprolol succinate (TOPROL-XL) 25 MG 24 hr tablet Take 12.5 mg by mouth every morning.  3  .  Omega-3 Fatty Acids (FISH OIL) 1000 MG CAPS Take 1,000 capsules by mouth daily.    Marland Kitchen oxybutynin (DITROPAN-XL) 5 MG 24 hr tablet Take 10 mg by mouth at bedtime.    . pantoprazole (PROTONIX) 40 MG tablet Take 1 tablet (40 mg total) by mouth daily. 30 tablet 0  . phenazopyridine (PYRIDIUM) 200 MG tablet Take 1 tablet (200 mg total) by mouth 3 (three) times daily. 6 tablet 0  . predniSONE (DELTASONE) 20 MG tablet Take 1 tablet (20 mg total) by mouth 2 (two) times daily with a meal. 10 tablet 0  . rivaroxaban (XARELTO) 20 MG TABS tablet Take 20 mg by mouth daily with supper.    . tiotropium (SPIRIVA) 18 MCG inhalation capsule Place 1 capsule (18 mcg total) into inhaler and inhale daily. 30 capsule 12  . traMADol (ULTRAM) 50 MG tablet Take 1 tablet (50 mg total) by mouth every 6 (six) hours as needed for moderate pain (pain). (Patient taking differently: Take 50 mg by mouth at bedtime as  needed for moderate pain (pain). ) 30 tablet 0  . VENTOLIN HFA 108 (90 BASE) MCG/ACT inhaler Inhale 2 puffs into the lungs 4 (four) times daily as needed for wheezing or shortness of breath.     . vitamin B-12 (CYANOCOBALAMIN) 500 MCG tablet Take 1,000 mcg by mouth daily.       Allergies: Allergies as of 09/23/2015 - Review Complete 09/23/2015  Allergen Reaction Noted  . Eggs or egg-derived products Other (See Comments) 11/22/2014   Past Medical History  Diagnosis Date  . Hypertension   . Shortness of breath   . Asthma   . COPD (chronic obstructive pulmonary disease) (HCC)     emphysema    . GERD (gastroesophageal reflux disease)   . Blood dyscrasia     hx blood clotts  . Hyperlipidemia   . Anemia   . Pneumothorax on right 09/2014  . On home oxygen therapy     "3L; 24/7" (11/21/2014)  . Pneumonia     hx pneumonia   . Chronic bronchitis ([place])   . Pulmonary embolism ([place])   . History of blood transfusion X 2    "related to blood thinner I was taking"  . [id number])     "a few times/year" (11/21/2014)  . Migraine     "weekly probably" (11/21/2014)  . Arthritis     "back" (11/21/2014)  . Chronic lower back pain   . Chronic kidney disease   . Urinary incontinence    Past Surgical History  Procedure Laterality Date  . Hemiarthroplasty shoulder fracture Right   . Cataract extraction w/ intraocular lens  implant, bilateral Bilateral   . Orif wrist fracture  10/13/2011    Procedure: OPEN REDUCTION INTERNAL FIXATION (ORIF) WRIST FRACTURE;  Surgeon: [name], MD;  Location: [facility];  Service: Orthopedics;  Laterality: Right;  Open Reduction Internal Fixation Right Distal Radius   . Hardware removal  01/29/2012    Procedure: HARDWARE REMOVAL;  Surgeon: [name], MD;  Location: [facility];  Service: Orthopedics;  Laterality: Right;  . Hip arthroplasty  01/29/2012    Procedure: ARTHROPLASTY BIPOLAR HIP;  Surgeon: [name], MD;  Location: [facility];  Service: Orthopedics;  Laterality:  Right;  . Transurethral resection of bladder tumor N/A 06/11/2014    Procedure: TRANSURETHRAL RESECTION OF BLADDER TUMOR (TURBT);  Surgeon: [name], MD;  Location: [facility];  Service: Urology;  Laterality: N/A;  . Cystoscopy w/ retrogrades Bilateral 06/11/2014  Procedure: CYSTOSCOPY WITH RETROGRADE PYELOGRAM;  Surgeon: [name], MD;  Location: [facility];  Service: Urology;  Laterality: Bilateral;  . Fracture surgery    . Laparoscopic cholecystectomy    . Tubal ligation    . Abdominal hysterectomy     Family History  Problem Relation Age of Onset  . Cancer - Lung Mother   . Coronary artery disease Mother   . Coronary artery disease Sister   . Coronary artery disease Brother    Social History   Social History  . Marital Status: Widowed    Spouse Name: N/A  . Number of Children: N/A  . Years of Education: N/A   Occupational History  . Not on file.   Social History Main Topics  . Smoking status: Former Smoker -- 1.00 packs/day for 56 years    Types: Cigarettes    Quit date: 02/12/2014  . Smokeless tobacco: Never Used  . Alcohol Use: No  . Drug Use: No  . Sexual Activity: Not on file   Other Topics Concern  . Not on file   Social History Narrative    Review of Systems: Pertinent items are noted in HPI.  GU: dysuria and polyuria  Physical Exam: Blood pressure 137/53, pulse 82, temperature 98 F (36.7 C), temperature source Oral, resp. rate 18, SpO2 97 %. BP 136/59 mmHg  Pulse 91  Temp(Src) 97.3 F (36.3 C) (Oral)  Resp 18  SpO2 92%     Exam: General: Laying in bed in no acute distress, breathing comfortably on nasal cannula HEENT: [place]/AT, EOMI, MMM CV: RRR, no m/r/g Pulm: decreaed breath sounds bilaterally, no wheezing appreciated Back: symmetric, mild kyphosis Abdomen: soft,nontender, non distended, mild pain to palpation in lower abdomen  Extremities:no cyanosis or edema Neuro: CN II-XII grossly intact    Lab results: Urinalysis    Component  Value Date/Time   COLORURINE RED* 09/23/2015 1450   APPEARANCEUR TURBID* 09/23/2015 1450   LABSPEC 1.019 09/23/2015 1450   PHURINE 6.5 09/23/2015 1450   GLUCOSEU NEGATIVE 09/23/2015 1450   HGBUR LARGE* 09/23/2015 1450   BILIRUBINUR NEGATIVE 09/23/2015 1450   KETONESUR NEGATIVE 09/23/2015 1450   PROTEINUR 100* 09/23/2015 1450   UROBILINOGEN 1.0 11/23/2014 0249   NITRITE NEGATIVE 09/23/2015 1450   LEUKOCYTESUR SMALL* 09/23/2015 1450   BMP Latest Ref Rng 09/23/2015 08/30/2015 08/23/2015  Glucose 65 - 99 mg/dL 107(H) 87 86  BUN 6 - 20 mg/dL 22(H) 20 14  Creatinine 0.44 - 1.00 mg/dL 0.75 0.81 0.76  Sodium 135 - 145 mmol/L 141 135 138  Potassium 3.5 - 5.1 mmol/L 4.2 4.6 3.7  Chloride 101 - 111 mmol/L 103 98(L) 97(L)  CO2 22 - 32 mmol/L 30 31 35(H)  Calcium 8.9 - 10.3 mg/dL 9.1 9.3 8.9    CBC Latest Ref Rng 09/23/2015 08/30/2015 08/23/2015  WBC 4.0 - 10.5 K/uL 10.2 18.8(H) 6.2  Hemoglobin 12.0 - 15.0 g/dL 12.3 13.4 11.4(L)  Hematocrit 36.0 - 46.0 % 42.6 43.7 37.8  Platelets 150 - 400 K/uL 194 251 113(L)        Imaging results:  Dg Chest 2 View  09/23/2015  CLINICAL DATA:  Shortness of breath and wheezing for the past 2 weeks. EXAM: CHEST  2 VIEW COMPARISON:  08/30/2015. FINDINGS: Normal sized heart. Clear lungs. Right upper lobe surgical staples and apical blebs. The lungs remain mildly hyperexpanded with mildly prominent interstitial markings. Diffuse osteopenia. IMPRESSION: 1. No acute abnormality. 2. Stable mild changes of COPD. Electronically Signed   By: Remo Lipps  Joneen Caraway M.D.   On: 09/23/2015 13:49    Other results: EKG: sinus rhythm, LVH, no sig change from last reading.  Assessment & Plan by Problem:  COPD exacerbation: Patient presents with 2 weeks of worsening SOB likely secondary to a COPD exacerbation.  She has not been dyspneic, coughing up more mucous than normal, or noticing any color change in her mucous.  She denies sudden onset, leg swelling or hemoptysis and she's on  anti-coagulation so my suspicion for a PE is low.  She denies chest pain, has normal troponin level, and EKG shows normal sinus rhythm with no T wave abnormalities, no my suspicion for a cardiac event is also very low. Patient has a hx of anemia, but her Hg is 12.3 so this should not be contributing.  She desaturated to 89% and had to sit down when nurse tried to walk her to door, so we will admit her and continue monitoring oxygen saturation and continue her COPD home medications. We will hold antibiotics since this does not seem to have an infectious etiology. -Albuterol 2.5mg  by nebulizer q6 -symbicort 2 puffs BID -prednisone 20mg  BID, starting 7/12 -tiotropium 1 capsule daily  Dysuria: Patient complains of worsening dysuria and polyuria for several weeks, however she also says that she deals with this chronically.  Her U/A was inconclusive for infection but did show RBCs. -f/u results of urine culture  HTN: Patients BP has wnl, and seems well controlled on home meds. Continue home meds.  -continue 240mg  diltiazam, 20mg  furosemide, 25mg  metoprolol succinate, 20mg  lasix  CKD: Cr is .75, no need for intervention.   GERD -continue home 40mg  protonix  HLD -continue home 40mg  lipitor  Anemia: Hg is 12.3 with an MCV of 94.9. Continue home med.  -ferrous sulfate 325mg   Prior PE -rivaroxiban 20mg  with dinner  Urinary incontinence -oxybutynin 5mg  once daily    This is a Careers information officer Note.  The care of the patient was discussed with Dr. Quay Burow and the assessment and plan was formulated with their assistance.  Please see their note for official documentation of the patient encounter.   Signed: Elesa Hacker, Med Student 09/23/2015, 4:38 PM

## 2015-09-23 NOTE — H&P (Signed)
Date: 09/23/2015               Patient Name:  [name] [name] MRN: [id number]  DOB: 10-05-1940 Age / Sex: 75 y.o., female   PCP: Leonides Sake, MD         Medical Service: Internal Medicine Teaching Service         Attending Physician: Dr. Oval Linsey, MD    First Contact: Melissa Montane MS-IV Pager: [phone]  Second Contact: Dr. Arcelia Jew Pager: [phone]       After Hours (After 5p/  First Contact Pager: [phone]  weekends / holidays): Second Contact Pager: [phone]   Chief Complaint: shortness of breath  History of Present Illness: [name] is a 75 yo female with PMHx of COPD on chronic 2L O2 via [place], questionable h/o DVT/PE, h/o PAF on Xarelto, h/o pneumothorax, HTN, CAD, hemorrhagic cystitis who presents to the ED with shortness of breath.   Patient admits to increased shortness of breath for past 1-2 weeks. It has progressed to the point that she has been unable to ambulate to the next room which is unusual for. She wears 2L continuous oxygen at home and has tried using her breathing treatments at home without improvement. She has been coughing up clear phlegm which is normal for her and denies any increase in volume or change in color. She admits to wheezing, nasal congestion, and post-nasal drip. She denies any fever, chills, chest pain, nausea, vomiting, increased leg swelling. She denies orthopnea.   She does admit to leg and feet cramping, dysuria (chronic), increased frequency and suprapubic pain which is unusual for her.   In the ED, patient was afebrile, normotensive, HR 88, RR 16 and satting 96% on 3L [place]. CBC and BMET showed WBC 10.2, Hgb 12.3. Troponin negative. UA has some squamous cells but showed rare bacteria, red color, large hemoglobin, small leukocytes, RBC TNTC, and 6-30 WBC.    Meds: Current Facility-Administered Medications  Medication Dose Route Frequency Provider Last Rate Last Dose  . 0.9 %  sodium chloride infusion  250 mL Intravenous PRN Samiha Denapoli Angela Burke, MD      . acetaminophen (TYLENOL) tablet 650 mg  650 mg Oral Q6H PRN Abel Hageman Angela Burke, MD       Or  . acetaminophen (TYLENOL) suppository 650 mg  650 mg Rectal Q6H PRN Shalondra Wunschel Angela Burke, MD      . albuterol (PROVENTIL HFA;VENTOLIN HFA) 108 (90 Base) MCG/ACT inhaler 2 puff  2 puff Inhalation QID PRN Florinda Marker, MD      . Derrill Memo ON 09/24/2015] atorvastatin (LIPITOR) tablet 40 mg  40 mg Oral q morning - 10a Cleophus Mendonsa Angela Burke, MD      . Derrill Memo ON 09/24/2015] diltiazem (CARDIZEM CD) 24 hr capsule 240 mg  240 mg Oral q morning - 10a Vela Render R Jp Eastham, MD      . docusate sodium (COLACE) capsule 100 mg  100 mg Oral Daily PRN Myrick Mcnairy R Aleece Loyd, MD      . fluticasone (FLONASE) 50 MCG/ACT nasal spray 1 spray  1 spray Each Nare Daily PRN Alysse Rathe Angela Burke, MD      . guaiFENesin (MUCINEX) 12 hr tablet 600 mg  600 mg Oral BID PRN Amauri Medellin R Quasim Doyon, MD      . ipratropium-albuterol (DUONEB) 0.5-2.5 (3) MG/3ML nebulizer solution 3 mL  3 mL Nebulization Q6H Burma Ketcher R Conlin Brahm, MD      . Derrill Memo ON 09/24/2015] metoprolol succinate (TOPROL-XL) 24  hr tablet 12.5 mg  12.5 mg Oral q morning - 10a Corbett Moulder Angela Burke, MD      . mometasone-formoterol (DULERA) 200-5 MCG/ACT inhaler 2 puff  2 puff Inhalation BID Alanys Godino Angela Burke, MD      . oxybutynin (DITROPAN-XL) 24 hr tablet 10 mg  10 mg Oral QHS Saranne Crislip Angela Burke, MD      . Derrill Memo ON 09/24/2015] pantoprazole (PROTONIX) EC tablet 40 mg  40 mg Oral Daily Tiamarie Furnari Angela Burke, MD      . Derrill Memo ON 09/24/2015] predniSONE (DELTASONE) tablet 40 mg  40 mg Oral Q breakfast Ashonti Leandro R Marialuiza Car, MD      . rivaroxaban (XARELTO) tablet 20 mg  20 mg Oral Q supper [name] Holthaus R Ethelle Ola, MD      . sodium chloride flush (NS) 0.9 % injection 3 mL  3 mL Intravenous Q12H Naijah Lacek R Levi Klaiber, MD      . sodium chloride flush (NS) 0.9 % injection 3 mL  3 mL Intravenous PRN Melinna Linarez Angela Burke, MD        Allergies: Allergies as of 09/23/2015 - Review Complete 09/23/2015  Allergen Reaction Noted  . Eggs or egg-derived products Other (See Comments) 11/22/2014    Past Medical History  Diagnosis Date  . Hypertension   . Shortness of breath   . Asthma   . COPD (chronic obstructive pulmonary disease) (HCC)     emphysema    . GERD (gastroesophageal reflux disease)   . Blood dyscrasia     hx blood clotts  . Hyperlipidemia   . Anemia   . Pneumothorax on right 09/2014  . On home oxygen therapy     "3L; 24/7" (11/21/2014)  . Pneumonia     hx pneumonia   . Chronic bronchitis ([place])   . Pulmonary embolism ([place])   . History of blood transfusion X 2    "related to blood thinner I was taking"  . [id number])     "a few times/year" (11/21/2014)  . Migraine     "weekly probably" (11/21/2014)  . Arthritis     "back" (11/21/2014)  . Chronic lower back pain   . Chronic kidney disease   . Urinary incontinence     Family History: Mother: Lung Cancer  Social History:  Tobacco Use: 2-3 ppd x 50 years, quit 4 years ago Alcohol Use: No Illicit Drug Use: No Lives by herself, no stairs at home, able to perform her own ADLs but needs help  Family requesting [name] for help at home 2 sons, one lives her and one in [place]: A complete ROS was negative except as per HPI.   Physical Exam: Filed Vitals:   09/23/15 1545 09/23/15 1615 09/23/15 1700 09/23/15 1724  BP: 120/52 137/53 136/59   Pulse: 80 82 91   Temp:    97.3 F (36.3 C)  TempSrc:    Oral  Resp: 20 18 18    SpO2: 97% 97% 92%    General: Vital signs reviewed.  Patient is elderly, in no acute distress and cooperative with exam.  Head: Normocephalic and atraumatic. Eyes: PERRLA, no scleral icterus.  Neck: Supple, trachea midline, no carotid bruit present.  Cardiovascular: RRR, S1 normal, S2 normal, no murmurs, gallops, or rubs. Pulmonary/Chest: Decreased breath sounds bilaterally, no wheezing, rhonchi or rales. No accessory muscle use, speaking in full sentences.  Abdominal: Soft, non-tender, non-distended, BS +, no guarding present.  Extremities: No lower extremity  edema bilaterally, pulses symmetric and intact bilaterally.  Neurological: Awake and alert. Answer questions appropriately, moving all extremities.  Skin: Warm, dry and intact. Psychiatric: Normal mood and affect. Speech and behavior is normal. Cognition and memory are grossly normal.   EKG: NSR. TWI in V2, with borderline LVH  CXR: RUL surgical staples, apical blebs, hyper-expanded lungs consistent with COPD, no acute cardiopulmonary disease.  Assessment & Plan by Problem: Principal Problem:   COPD exacerbation ([place]) Active Problems:   CAD (coronary artery disease)   Hypertension   CKD (chronic kidney disease)   Osteoarthritis   Thrombocytopenia (HCC)   Hematuria  Ms. Lemoine is a 75 yo female with PMHx of COPD on chronic 2L O2 via [place], questionable h/o DVT/PE, h/o PAF on Xarelto, h/o pneumothorax, HTN, CAD, hemorrhagic cystitis who presents to the ED with shortness of breath.   COPD Exacerbation: Given h/o COPD, increased shortness of breath and DOE, wheezing, desaturations with exertions, patient is most likely experiencing a mild COPD exacerbation 2/2 viral URI given nasal congestion and post nasal drip. She denies increased sputum volume or increased purulence. I feel she is appropriate to withhold antibiotics at this time. I doubt PE given chronicity and that patient is on Xarelto. Doubt CHF exacerbation given lack of orthopnea and lack of physical exam findings such as edema and crackles. We will treat with Duonebs Q6H and prednisone 40 mg for a total of 5 days of steroid therapy. Patient is compliant with her medications at home.  -Admit to med-surg observation -Wean O2 to 2L -Duonebs Q6H -Prednisone 40 mg daily x 5 days -No antibiotics at this time -Ambulate with pulse ox tomorrow -Consider outpatient PFTs  -Continue Dulera BID -Albuterol prn  -Repeat CBC/BMET tomorrow am -Consult to Care management for home health assistance as requested by family  Hematuria: H/o  hemorrhagic cystitis and h/o bladder mass s/p resection. UA has some squamous cells but showed rare bacteria, red color, large hemoglobin, small leukocytes, RBC TNTC, and 6-30 WBC. Patient was recently evaluated for this in the ED on 6/17. At that time, she was able to urinate without obstruction, and felt to be 2/2 xarelto or recurrent bladder mass. Her hemoglobin was stable. She was given an extension of macrobid for 7 days and instructed to follow up with her Urologist. Here, patient's hemoglobin is 12.3 at her baseline of 12-13.  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 UA has some squamous cells but showed rare bacteria, red color, large hemoglobin, small leukocytes, RBC TNTC, and 6-30 WBC.Patient recently treated with Macrobid in June. Keflex makes her dizzy.  -Follow up urine culture  HTN: Normotensive.  -Continue home diltiazem 240mg  QD, metoprolol succinate 25 mg QD  GERD: On protonix at home.  -Continue home protonix 40 mg daily  HLD: On atorvastatin 40 mg daily at home. -Continue atorvastatin  Chronic Normocytic Anemia: Hg is 12.3 with an MCV of 94.9. Patient is on iron supplementation at home. Likely secondary to blood loss anemia. Hemoglobin is at baseline. Last ferritin 17 in March 2016.  -Continue iron supplementation -Repeat CBC tomorrow am  History of DVT/PE: Not well defined in my preliminary search of Epic. She is also on Xarelto for PAF and likely should be on chronic anticoagulation for these issues; however, she does have  hematuria. We will need to more fully investigate these issues tomorrow to weight risk benefit. For now, we will continue Xarelto.  -Continue Xarelto 20 mg daily  Urinary incontinence -Continue  home oxybutynin 5 mg once daily  Dispo: Admit patient to Observation with expected length of stay less than 2 midnights.  Signed: Martyn Malay, DO PGY-3 Internal Medicine Resident Pager # [phone] 09/23/2015 7:12 PM

## 2015-09-23 NOTE — ED Notes (Signed)
Attempted report 

## 2015-09-23 NOTE — ED Notes (Signed)
Patient transported to X-ray 

## 2015-09-24 ENCOUNTER — Telehealth: Payer: Self-pay

## 2015-09-24 DIAGNOSIS — J441 Chronic obstructive pulmonary disease with (acute) exacerbation: Secondary | ICD-10-CM

## 2015-09-24 DIAGNOSIS — F419 Anxiety disorder, unspecified: Secondary | ICD-10-CM | POA: Diagnosis not present

## 2015-09-24 LAB — URINE CULTURE: Culture: NO GROWTH

## 2015-09-24 LAB — CBC
HCT: 42.1 % (ref 36.0–46.0)
HEMOGLOBIN: 11.9 g/dL — AB (ref 12.0–15.0)
MCH: 26.5 pg (ref 26.0–34.0)
MCHC: 28.3 g/dL — AB (ref 30.0–36.0)
MCV: 93.8 fL (ref 78.0–100.0)
Platelets: 191 10*3/uL (ref 150–400)
RBC: 4.49 MIL/uL (ref 3.87–5.11)
RDW: 15.6 % — AB (ref 11.5–15.5)
WBC: 15.6 10*3/uL — ABNORMAL HIGH (ref 4.0–10.5)

## 2015-09-24 LAB — BASIC METABOLIC PANEL
Anion gap: 8 (ref 5–15)
BUN: 19 mg/dL (ref 6–20)
CALCIUM: 9.4 mg/dL (ref 8.9–10.3)
CO2: 27 mmol/L (ref 22–32)
Chloride: 104 mmol/L (ref 101–111)
Creatinine, Ser: 0.6 mg/dL (ref 0.44–1.00)
GFR calc Af Amer: >60 mL/min (ref >60)
GLUCOSE: 138 mg/dL — AB (ref 65–99)
Potassium: 4 mmol/L (ref 3.5–5.1)
Sodium: 139 mmol/L (ref 135–145)

## 2015-09-24 MED ORDER — SULFAMETHOXAZOLE-TRIMETHOPRIM 400-80 MG PO TABS
1.0000 | ORAL_TABLET | Freq: Two times a day (BID) | ORAL | Status: DC
  Filled 2015-09-24: qty 1

## 2015-09-24 MED ORDER — PREDNISONE 20 MG PO TABS: 40.0000 mg | ORAL_TABLET | Freq: Every day | ORAL | Status: DC

## 2015-09-24 MED ORDER — SERTRALINE HCL 25 MG PO TABS: 25.0000 mg | ORAL_TABLET | Freq: Every day | ORAL | Status: DC

## 2015-09-24 MED ORDER — SERTRALINE HCL 25 MG PO TABS: 25.0000 mg | ORAL_TABLET | Freq: Every day | ORAL | Status: AC

## 2015-09-24 MED ORDER — SULFAMETHOXAZOLE-TRIMETHOPRIM 800-160 MG PO TABS
1.0000 | ORAL_TABLET | Freq: Two times a day (BID) | ORAL | Status: DC
  Administered 2015-09-24: 1 via ORAL
  Filled 2015-09-24: qty 1

## 2015-09-24 MED ORDER — SULFAMETHOXAZOLE-TRIMETHOPRIM 800-160 MG PO TABS: 1.0000 | ORAL_TABLET | Freq: Two times a day (BID) | ORAL | Status: DC

## 2015-09-24 NOTE — Care Management Note (Signed)
Case Management Note  Patient Details  Name: [name] [name] MRN: [id number] Date of Birth: 02-27-1941  Subjective/Objective:                 Patient in obs with hematuria, falls bruising, COPD exacerbation.   Action/Plan:  HH referral to [facility] [facility] per patient's choice, rolator to be delivered to room by [facility], request for oxygen for transport by [facility]. Expected Discharge Date:                  Expected Discharge Plan:  [place]  In-House Referral:     Discharge planning Services  CM Consult  Post Acute Care Choice:  Home Health, Durable Medical Equipment Choice offered to:  Patient  DME Arranged:  Walker rolling with seat (oxygen for transport) DME Agency:  [place]:  PT, OT HH Agency:  [facility] [place]  Status of Service:  Completed, signed off  If discussed at [place] of Stay Meetings, dates discussed:    Additional Comments:  Carles Collet, RN 09/24/2015, 2:17 PM

## 2015-09-24 NOTE — Progress Notes (Signed)
Subjective: [name] [name] is a 75yo F with a hx of COPD on chronic 2L O2 via [place], questionable h/o DVT/PE, h/o PAF on Xarelto, h/o pneumothorax, HTN, CAD, hemorrhagic cystitis who presents to the ED with shortness of breath.  Patient states that she is still having shortness of breath, and it is about the same as yesterday.  She feels like the breathing treatments are helping. She continues to endorse producing a small amount of clear sputum, which is her baseline. She states she can not walk to the door without a nurse because she'll get too short of breath. She denies fevers, chills, chest pain, pleuritic chest pain, hemoptysis, leg swelling, or wheezing.    Objective: Vital signs in last 24 hours: Filed Vitals:   09/23/15 2141 09/24/15 0534 09/24/15 0831 09/24/15 0945  BP: 158/65 143/58  143/58  Pulse: 90 99  108  Temp: 98.1 F (36.7 C) 97.9 F (36.6 C)    TempSrc: Oral Oral    Resp: 20 18    Height:      Weight:      SpO2: 93% 96% 96%    Weight change:   Intake/Output Summary (Last 24 hours) at 09/24/15 1305 Last data filed at 09/24/15 [id number]  Gross per 24 hour  Intake      0 ml  Output   1000 ml  Net  -1000 ml   General:Patient is elderly, in no acute distress and cooperative with exam. Speaking in full sentences.  Head: Normocephalic and atraumatic. Eyes: PERRLA, no scleral icterus.  Neck: Supple, trachea midline, no carotid bruit present.  Cardiovascular: RRR, S1 normal, S2 normal, no murmurs, gallops, or rubs. Pulmonary/Chest: Decreased breath sounds bilaterally, no wheezing, rhonchi or rales. No accessory muscle use, speaking in full sentences.  Abdominal: Soft, non-tender, non-distended, BS +, no guarding present.  Extremities: No lower extremity edema bilaterally, pulses symmetric and intact bilaterally.  Neurological: Awake and alert. Answer questions appropriately, moving all extremities.  Skin: Warm, dry and intact. Psychiatric: Normal mood and affect. Speech and  behavior is normal. Cognition and memory are grossly normal.   Lab Results: BMP Latest Ref Rng 09/24/2015 09/23/2015 08/30/2015  Glucose 65 - 99 mg/dL 138(H) 107(H) 87  BUN 6 - 20 mg/dL 19 22(H) 20  Creatinine 0.44 - 1.00 mg/dL 0.60 0.75 0.81  Sodium 135 - 145 mmol/L 139 141 135  Potassium 3.5 - 5.1 mmol/L 4.0 4.2 4.6  Chloride 101 - 111 mmol/L 104 103 98(L)  CO2 22 - 32 mmol/L 27 30 31   Calcium 8.9 - 10.3 mg/dL 9.4 9.1 9.3    CMP Latest Ref Rng 09/24/2015 09/23/2015 08/30/2015  Glucose 65 - 99 mg/dL 138(H) 107(H) 87  BUN 6 - 20 mg/dL 19 22(H) 20  Creatinine 0.44 - 1.00 mg/dL 0.60 0.75 0.81  Sodium 135 - 145 mmol/L 139 141 135  Potassium 3.5 - 5.1 mmol/L 4.0 4.2 4.6  Chloride 101 - 111 mmol/L 104 103 98(L)  CO2 22 - 32 mmol/L 27 30 31   Calcium 8.9 - 10.3 mg/dL 9.4 9.1 9.3  Total Protein 6.5 - 8.1 g/dL - - 6.7  Total Bilirubin 0.3 - 1.2 mg/dL - - 0.4  Alkaline Phos 38 - 126 U/L - - 79  AST 15 - 41 U/L - - 21  ALT 14 - 54 U/L - - 29     Micro Results: Recent Results (from the past 240 hour(s))  Urine culture     Status: None   Collection Time: 09/23/15  2:50 PM  Result Value Ref Range Status   Specimen Description URINE, CLEAN CATCH  Final   Special Requests NONE  Final   Culture NO GROWTH  Final   Report Status 09/24/2015 FINAL  Final   Studies/Results: Dg Chest 2 View  09/23/2015  CLINICAL DATA:  Shortness of breath and wheezing for the past 2 weeks. EXAM: CHEST  2 VIEW COMPARISON:  08/30/2015. FINDINGS: Normal sized heart. Clear lungs. Right upper lobe surgical staples and apical blebs. The lungs remain mildly hyperexpanded with mildly prominent interstitial markings. Diffuse osteopenia. IMPRESSION: 1. No acute abnormality. 2. Stable mild changes of COPD. Electronically Signed   By: Claudie Revering M.D.   On: 09/23/2015 13:49   Medications: I have reviewed the patient's current medications. Scheduled Meds: . atorvastatin  40 mg Oral q morning - 10a  . diltiazem  240 mg Oral q  morning - 10a  . ipratropium-albuterol  3 mL Nebulization Q6H  . metoprolol succinate  12.5 mg Oral q morning - 10a  . oxybutynin  10 mg Oral QHS  . pantoprazole  40 mg Oral Daily  . predniSONE  40 mg Oral Q breakfast  . rivaroxaban  20 mg Oral Q supper  . sertraline  25 mg Oral QHS  . sodium chloride flush  3 mL Intravenous Q12H  . sulfamethoxazole-trimethoprim  1 tablet Oral Q12H   Continuous Infusions:  PRN Meds:.sodium chloride, acetaminophen **OR** acetaminophen, albuterol, docusate sodium, fluticasone, guaiFENesin, sodium chloride flush  Assessment/Plan:  COPD exacerbation: Patient presents with 2 weeks of worsening SOB secondary to a COPD exacerbation. She continues to deny coughing up more mucous than normal, or noticing any color change in her mucous so we will hold antibiotics.Her oxygen saturation has been stable, around 91-98% on 2L nasal canula, which is what she uses at home.  She states that sometimes her "acute" episodes are precipitated by anxiety.  She routinely uses her home nebulizer treatments 4 times a day.  This was likely precipitated by a viral URI.  She is saturating well on her nasal cannula, she's not having difficulty speaking in full sentences, and she is improving on prednisone so she should be able to go home today.  -Dulera BID -prednisone 20mg  BID for 5 days (end 09/27/15) -Zoloft for anxiety 25mg  QHS -[facility] follow up -2L oxygen nasal canula  Cystitis: Patient complains of worsening dysuria, polyuria and urgency that has been worsening for several weeks.  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  She denies weight loss or night sweats.  Her U/A was inconclusive for infection but did show RBCs  Urine culture showed no growth at 1 day. She's on xarelto but her hg is stable. She has not been treated for a UTI recently, so I feel comfortable starting her on Abx for cystitis. -Bactrim  800-160mg  a12h for 5 days -urology outpatient  Paroxsymal A fib: In sinus rhythm on admission.  - Continue Toprol-XL 12.5 mg daily - Continue Xarelto 20 mg daily   HTN: Patients BP has wnl, and seems well controlled on home meds. Continue home meds.  -continue 240mg  diltiazam, 20mg  furosemide, Toprol-XL 12.5mg  daily  CKD: Cr is .75, no need for intervention.   GERD -continue home 40mg  protonix  HLD -continue home 40mg  lipitor  Normocytic Anemia: Hg is 12.3 with an MCV of 94.9. Continue home med. No evidence of active bleeding.  Her normal ferritin level in  March 2016 indicates that she's not iron deficient. Likely anemia of chronic dz.   Prior PE: unclear as this is not in chart, and patient is not clear.  Xarelto is for a fib.  -rivaroxiban 20mg  with dinner  Urinary incontinence -oxybutynin 5mg  once daily  This is a Careers information officer Note.  The care of the patient was discussed with Dr. Arcelia Jew and the assessment and plan formulated with their assistance.  Please see their attached note for official documentation of the daily encounter.     Elesa Hacker, Med Student 09/24/2015, 1:05 PM

## 2015-09-24 NOTE — Telephone Encounter (Signed)
Joann from [facility] requesting the nurse to call back regarding prednisone and bactrim.

## 2015-09-24 NOTE — Evaluation (Signed)
Occupational Therapy Evaluation Patient Details Name: [name] [name] MRN: [id number] DOB: 01-21-1941 Today's Date: 09/24/2015    History of Present Illness Ms. Tresner is a 75 yo F with a hx of COPD, HTN, PE, HLD, arthritis and CKD who presents with increasing SOB for the past 1-2 weeks found to have CHF exacerbation   Clinical Impression   This 75 yo female admitted with above presents to acute OT with deficits below. (see OT progress note) thus affecting her PLOF of Mod independent at home. She will benefit from acute OT with follow up [place] to get back to her PLOF in her own environment. Of note Sats on 2L initially at 91% and in the 100's. With gait on 2L sats dropped to 91% at 120 bpm and then down to 88/89% at 118 bpm. Tried to increased O2 to 3L with rest of SpO2 in the low 90's and rising to 95%     Follow Up Recommendations  Home health OT    Equipment Recommendations  None recommended by OT       Precautions / Restrictions Precautions Precautions: Fall, monitor O2 sats Restrictions Weight Bearing Restrictions: No      Mobility Bed Mobility Overal bed mobility: Needs Assistance Bed Mobility: Supine to Sit     Supine to sit: Supervision        Transfers Overall transfer level: Needs assistance   Transfers: Sit to/from Stand Sit to Stand: Min guard              Balance Overall balance assessment: Needs assistance Sitting-balance support: No upper extremity supported Sitting balance-Leahy Scale: Good     Standing balance support: Bilateral upper extremity supported Standing balance-Leahy Scale: Poor Standing balance comment: reliant on RW                            ADL Overall ADL's : Needs assistance/impaired Eating/Feeding: Independent;Sitting   Grooming: Supervision/safety;Standing;Set up   Upper Body Bathing: Supervision/ safety;Sitting;Set up   Lower Body Bathing: Set up;Supervison/ safety;Sit to/from stand   Upper Body  Dressing : Set up;Supervision/safety;Standing   Lower Body Dressing: Set up;Supervision/safety;Sit to/from stand   Toilet Transfer: Supervision/safety;Ambulation;RW   Toileting- Clothing Manipulation and Hygiene: Supervision/safety;Sit to/from stand         General ADL Comments: Tried to have to do purse lipped breathing; however pt unable to coordinate this even with verbal, gestrual,and tactile cues               Pertinent Vitals/Pain Pain Assessment: No/denies pain     Hand Dominance Left   Extremity/Trunk Assessment Upper Extremity Assessment Upper Extremity Assessment: Overall WFL for tasks assessed   Lower Extremity Assessment Lower Extremity Assessment: Overall WFL for tasks assessed (mild proximal weakness)       Communication Communication Communication: HOH   Cognition Arousal/Alertness: Awake/alert Behavior During Therapy: WFL for tasks assessed/performed Overall Cognitive Status: Within Functional Limits for tasks assessed                                Home Living Family/patient expects to be discharged to:: Private residence Living Arrangements: Alone Available Help at Discharge: Family;Available PRN/intermittently Type of Home: House Home Access: Level entry     Home Layout: One level     Bathroom Shower/Tub:  (sponge bath)   Bathroom Toilet: Standard     Home Equipment: Walker - 4 wheels;Bedside  commode   Additional Comments: wears home O2 2L /min, sleeps on couch with BSC beside it; others take her to store --she does not drive      Prior Functioning/Environment Level of Independence: Independent with assistive device(s)  Gait / Transfers Assistance Needed: uses rollator ADL's / Homemaking Assistance Needed: children do the cleaning, get meals on wheels and uses microwave meals; she does all of her medication   Comments: Pt does not drive; family assists with grocery shopping    OT Diagnosis: Generalized weakness    OT Problem List: Impaired balance (sitting and/or standing);Cardiopulmonary status limiting activity   OT Treatment/Interventions: Self-care/ADL training;Patient/family education;Balance training;Energy conservation;DME and/or AE instruction    OT Goals(Current goals can be found in the care plan section) Acute Rehab OT Goals Patient Stated Goal: I want to get my strength and conditioning back OT Goal Formulation: With patient Time For Goal Achievement: 10/01/15 Potential to Achieve Goals: Good  OT Frequency: Min 2X/week           Co-evaluation PT/OT/SLP Co-Evaluation/Treatment: Yes Reason for Co-Treatment: For patient/therapist safety PT goals addressed during session: Mobility/safety with mobility OT goals addressed during session: ADL's and self-care;Strengthening/ROM      End of Session Equipment Utilized During Treatment: Gait belt;Rolling walker Nurse Communication: Mobility status  Activity Tolerance: Patient tolerated treatment well Patient left: in chair;with call bell/phone within reach;with chair alarm set   Time: [id number] OT Time Calculation (min): 36 min Charges:  OT General Charges $OT Visit: 1 Procedure OT Evaluation $OT Eval Moderate Complexity: 1 Procedure G-Codes: OT G-codes **NOT FOR INPATIENT CLASS** Functional Assessment Tool Used: Clinical observation Functional Limitation: Self care Self Care Current Status [id number]): At least 1 percent but less than 20 percent impaired, limited or restricted Self Care Goal Status [id number]): At least 1 percent but less than 20 percent impaired, limited or restricted  Almon Register [id number] 09/24/2015, 2:43 PM

## 2015-09-24 NOTE — Care Management Obs Status (Signed)
[facility] NOTIFICATION   Patient Details  Name: [name] [name] MRN: [id number] Date of Birth: 1941-01-05   Medicare Observation Status Notification Given:  Yes    Carles Collet, RN 09/24/2015, 2:19 PM

## 2015-09-24 NOTE — Progress Notes (Addendum)
   Subjective: No acute events overnight. She reports feeling not quite at baseline, but better compared to yesterday. She admits to feeling anxious at times which in turn causes her to become short of breath and feeling that she cannot get any more air.   Objective: Vital signs in last 24 hours: Filed Vitals:   09/23/15 2141 09/24/15 0534 09/24/15 0831 09/24/15 0945  BP: 158/65 143/58  143/58  Pulse: 90 99  108  Temp: 98.1 F (36.7 C) 97.9 F (36.6 C)    TempSrc: Oral Oral    Resp: 20 18    Height:      Weight:      SpO2: 93% 96% 96%    Physical Exam General: frail elderly woman sitting up in bed, NAD HEENT: [place]/AT, EOMI, sclera anicteric, mucus membranes moist CV: RRR, no m/g/r Pulm: CTA bilaterally, breaths non-labored on 2 L oxygen via [place] Abd: BS+, soft, non-tender Ext: warm, no edema Neuro: alert and oriented x 3  Assessment/Plan:  Mild COPD Exacerbation: Her breathing has improved since yesterday. She appears very comfortable on exam and is at her baseline oxygen needs. Since she admitted to some underlying anxiety which in turns causes SOB, we will start her on an SSRI to help alleviate this. Will discharge her on Prednisone 40 mg for a 5 day course. She does not require antibiotics as she does not have increased sputum production or increased cough.  - Follow up in the [facility] - Continue 2 L oxygen via [place] - Prednisone 40 mg daily for 5 days (end date 09/27/15) - Start Zoloft 25 mg QHS - Continue Dulera BID - Awaiting PT/OT evals   UTI in Setting of Hx of Hematuria: Patient reports dysuria and frequency for last 2 weeks. Denies any prior treatment of UTI in last month. UA shows WBC 6-30, small leuks, and TNTC RBCs. Will treat with a 5 day course of Bactrim. She also has a hx of hemorrhagic cystitis and history of a bladder mass s/p resection. Pathology confirmed the bladder mass was benign. Given the TNTC RBCs in her urine, she should follow up with Urology as an outpatient.    - Start Bactrim 800-160 mg Q12H for 5 days total (end date: through 7/16) - Follow up with Urology outpatient   Paroxysmal AFib: In sinus rhythm on admission.  - Continue Toprol-XL 12.5 mg daily - Continue Xarelto 20 mg daily   HTN: BPs stable in [id number] systolic.  - Continue Cardizem 240 mg daily - Continue Toprol-XL 12.5 mg daily  GERD:  - Protonix 40 mg daily   Normocytic Anemia: Hgb 12.3 on admission, stable at 11.9 today. Ferritin 17 in March 2016. No evidence of acting bleeding. Most likely related to anemia of chronic disease. She does not need to be on oral iron therapy as she is not iron deficient.  - Will follow up as outpatient   History of DVT/PE?: Unclear, no documented PEs or DVTs in EPIC. She had a lower extremity duplex in November 2013 which was negative for DVT. CTA chest in November 2013 negative for PE. Appears she has been on Xarelto since 2015 but this is for her AFib. - Continue Xarelto 20 mg daily   Urinary Incontinence:  - Continue home Oxybutynin 5 mg daily   Diet: Heart healthy DVT Ppx: Xarelto  Dispo: Discharge today    Juliet Rude, MD 09/24/2015, 11:28 AM Pager: [phone]

## 2015-09-24 NOTE — H&P (Signed)
Internal Medicine Attending Admission Note Date: 09/24/2015  Patient name: [name] [name] Medical record number: [id number] Date of birth: 01-27-1941 Age: 75 y.o. Gender: female  I saw and evaluated the patient. I reviewed the resident's note and I agree with the resident's findings and plan as documented in the resident's note.  Chief Complaint(s): Progressive shortness of breath 1-2 weeks.  History - key components related to admission:  [name] [name] is a 75 year old woman with a history of oxygen requiring chronic obstructive pulmonary disease, paroxysmal atrial fibrillation, history of right-sided pneumothorax 3 requiring tube thoracostomy, coronary artery disease, hypertension, and hematuria who presents with a one to two-week history of progressive dyspnea on exertion. She states that she is unable to walk from one room in her house to another before becoming dyspneic. This is worse than her baseline. She denies an increase in her cough and states that her sputum is clear and unchanged in volume or character from baseline. She's had recent nasal congestion and postnasal drip but denies any fevers, shakes, chills, orthopnea, paroxysmal nocturnal dyspnea, or leg swelling. When asked about her episodes of shortness of breath she admits that she becomes very anxious and that tends to worsen her dyspnea. She has tried pursed lip breathing without success. She is not interested whatsoever in pulmonary rehabilitation despite our discussion on the benefits of such. She states she's been compliant with her pulmonary regimen. She was therefore admitted to the internal medicine teaching service for further evaluation and care.  Physical Exam - key components related to admission:  Filed Vitals:   09/24/15 0831 09/24/15 0945 09/24/15 1357 09/24/15 1428  BP:  143/58  128/51  Pulse:  108  102  Temp:    97.4 F (36.3 C)  TempSrc:    Oral  Resp:    18  Height:      Weight:      SpO2: 96%  92% 96%    Gen.: Well-developed, well-nourished, woman sitting comfortably in bed in no acute distress. She is not using her accessory muscles of respiration and is speaking in full sentences without difficulty. Lungs: Distant breath sounds posteriorly without wheezes, rhonchi, or rales. More superiorly there is improved air movement.  Lab results:  Basic Metabolic Panel:  Recent Labs  09/23/15 1300 09/24/15 0544  NA 141 139  K 4.2 4.0  CL 103 104  CO2 30 27  GLUCOSE 107* 138*  BUN 22* 19  CREATININE 0.75 0.60  CALCIUM 9.1 9.4   CBC:  Recent Labs  09/23/15 1300 09/24/15 0544  WBC 10.2 15.6*  HGB 12.3 11.9*  HCT 42.6 42.1  MCV 94.9 93.8  PLT 194 191   Cardiac Enzymes:  Recent Labs  09/23/15 1300  TROPONINI <0.03   Urinalysis:  Red, turbid, specific gravity 1.019, pH 6.5, hemoglobin large, protein 100, nitrite negative, leukocytes small, 6-30 white blood cells per high-power field, too numerous to count red blood cells per high-power field.  Misc. Labs:  Urine culture negative  Imaging results:  Dg Chest 2 View  09/23/2015  CLINICAL DATA:  Shortness of breath and wheezing for the past 2 weeks. EXAM: CHEST  2 VIEW COMPARISON:  08/30/2015. FINDINGS: Normal sized heart. Clear lungs. Right upper lobe surgical staples and apical blebs. The lungs remain mildly hyperexpanded with mildly prominent interstitial markings. Diffuse osteopenia. IMPRESSION: 1. No acute abnormality. 2. Stable mild changes of COPD. Electronically Signed   By: Claudie Revering M.D.   On: 09/23/2015 13:49   Other results:  EKG: Normal sinus rhythm at 81 bpm, normal axis, normal intervals, no significant Q waves, no LVH by voltage, good R wave progression, no ST or T-wave changes, unchanged from the previous ECG on 08/30/2015.  Assessment & Plan by Problem:  [name] [name] is a 75 year old woman with a history of oxygen requiring chronic obstructive pulmonary disease, paroxysmal atrial fibrillation, history of  right-sided pneumothorax 3 requiring tube thoracostomy, coronary artery disease, hypertension, and hematuria who presents with a one to two-week history of progressive dyspnea on exertion.  By definition she has a mild chronic obstructive pulmonary disease exacerbation. If it is infectious in nature it is more likely viral than bacterial. I am concerned that her chronic obstructive pulmonary disease is progressive and she is suffering from significant hyperdynamic airway compression during these episodes of anxiety which lead to a spiraling course. Unfortunately, she is not interested in pulmonary rehabilitation to learn various techniques to better manage her symptoms. She may benefit from further teaching on inhaler technique but her current regimen is optimal otherwise. I believe she is stable for discharge home with continued outpatient follow-up.  1) Mild COPD exacerbation: She will receive prednisone 40 mg by mouth daily for 5 days. Prior to discharge she will receive inhaler technique teaching by respiratory therapy. She will be continued on her Symbicort, as needed albuterol, and Tiotropium inhalers upon discharge. She has asked to be followed up in our primary care clinic and an appointment post discharge will be arranged. For her hyperdynamic airway compression we will start sertraline and titrate as required. I realize this may take several weeks to work and is less than ideal without concomitant pulmonary rehabilitation. Nonetheless, that is the best she will allow [place] to provide. We may get some benefit from the home physical therapy that will be arranged.  2) Disposition: She is being discharged home today with home physical therapy and follow-up in the [facility] for ongoing primary care. For her gross hematuria outpatient urologic follow-up will be required.

## 2015-09-24 NOTE — Evaluation (Addendum)
Physical Therapy Evaluation Patient Details Name: [name] [name] MRN: [id number] DOB: 1941/01/21 Today's Date: 09/24/2015   History of Present Illness  Ms. Kibler is a 75 yo F with a hx of COPD, HTN, PE, HLD, arthritis and CKD who presents with increasing SOB for the past 1-2 weeks found to have CHF exacerbation  Clinical Impression  Pt admitted with/for CHF exacerbation, COPD.  Pt currently limited functionally due to the problems listed below.  (see problems list.)  Pt will benefit from PT to maximize function and safety to be able to get home safely with available assist.     Follow Up Recommendations Home health PT    Equipment Recommendations  Other (comment) (Needs new rollator)    Recommendations for Other Services       Precautions / Restrictions Precautions Precautions: Fall Restrictions Weight Bearing Restrictions: No      Mobility  Bed Mobility Overal bed mobility: Needs Assistance Bed Mobility: Supine to Sit     Supine to sit: Supervision        Transfers Overall transfer level: Needs assistance   Transfers: Sit to/from Stand Sit to Stand: Min guard            Ambulation/Gait Ambulation/Gait assistance: Min guard Ambulation Distance (Feet): 160 Feet Assistive device: Rolling walker (2 wheeled) Gait Pattern/deviations: Step-through pattern Gait velocity: slower Gait velocity interpretation: Below normal speed for age/gender General Gait Details: generally steady overall with RW.  Sats on 2L initially at 91% and in the 100's.  With gait on 2L sats dropped to 91% at 120 bpm and then down to 88/89%  at 118 bpm.  Tried to increased O2 to 3L with rest of SpO2 in the low 90's and rising to 95%  Stairs            Wheelchair Mobility    Modified Rankin (Stroke Patients Only)       Balance Overall balance assessment: Needs assistance Sitting-balance support: No upper extremity supported Sitting balance-Leahy Scale: Good     Standing  balance support: Bilateral upper extremity supported Standing balance-Leahy Scale: Poor Standing balance comment: reliant on RW                             Pertinent Vitals/Pain Pain Assessment: No/denies pain    Home Living Family/patient expects to be discharged to:: Private residence Living Arrangements: Alone Available Help at Discharge: Family;Available PRN/intermittently Type of Home: House Home Access: Level entry     Home Layout: One level Home Equipment: Walker - 4 wheels;Bedside commode Additional Comments: wears home O2 2L /min, sleeps on couch with BSC beside it; others take her to store --she does not drive    Prior Function Level of Independence: Independent with assistive device(s)   Gait / Transfers Assistance Needed: uses rollator  ADL's / Homemaking Assistance Needed: children do the cleaning, get meals on wheels and uses microwave meals; she does all of her medication  Comments: Pt does not drive; family assists with grocery shopping     Hand Dominance   Dominant Hand: Left    Extremity/Trunk Assessment   Upper Extremity Assessment: Overall WFL for tasks assessed           Lower Extremity Assessment: Overall WFL for tasks assessed (mild proximal weakness)         Communication   Communication: HOH  Cognition Arousal/Alertness: Awake/alert Behavior During Therapy: WFL for tasks assessed/performed Overall Cognitive Status: Within Functional  Limits for tasks assessed                      General Comments      Exercises        Assessment/Plan    PT Assessment Patient needs continued PT services  PT Diagnosis Generalized weakness;Other (comment) (decreased activity tolerance)   PT Problem List Decreased strength;Decreased activity tolerance;Decreased mobility;Decreased knowledge of use of DME;Cardiopulmonary status limiting activity  PT Treatment Interventions Gait training;Stair training;DME instruction;Functional  mobility training;Therapeutic activities;Patient/family education   PT Goals (Current goals can be found in the Care Plan section) Acute Rehab PT Goals Patient Stated Goal: I want to get my strength and conditioning back PT Goal Formulation: With patient Time For Goal Achievement: 10/08/15 Potential to Achieve Goals: Good    Frequency Min 3X/week   Barriers to discharge        Co-evaluation PT/OT/SLP Co-Evaluation/Treatment: Yes Reason for Co-Treatment: For patient/therapist safety PT goals addressed during session: Mobility/safety with mobility OT goals addressed during session: ADL's and self-care;Strengthening/ROM       End of Session   Activity Tolerance: Patient tolerated treatment well Patient left: in chair;with call bell/phone within reach;with chair alarm set Nurse Communication: Mobility status    Functional Assessment Tool Used: clinical judgement Functional Limitation: Mobility: Walking and moving around Mobility: Walking and Moving Around Current Status [id number]): At least 1 percent but less than 20 percent impaired, limited or restricted Mobility: Walking and Moving Around Goal Status [phone]): 0 percent impaired, limited or restricted    Time: [id number] PT Time Calculation (min) (ACUTE ONLY): 36 min   Charges:   PT Evaluation $PT Eval Moderate Complexity: 1 Procedure     PT G Codes:   PT G-Codes **NOT FOR INPATIENT CLASS** Functional Assessment Tool Used: clinical judgement Functional Limitation: Mobility: Walking and moving around Mobility: Walking and Moving Around Current Status [id number]): At least 1 percent but less than 20 percent impaired, limited or restricted Mobility: Walking and Moving Around Goal Status [phone]): 0 percent impaired, limited or restricted    Kadarious Dikes, Tessie Fass 09/24/2015, 2:38 PM 09/24/2015  Donnella Sham, [place] [phone]  (pager)

## 2015-09-24 NOTE — Discharge Instructions (Signed)

## 2015-09-24 NOTE — Progress Notes (Addendum)
pts IV came out this am. Paged Dr Andree Elk, who said, "okay to leave out for now". Also made dr aware of pt having a headache that hasn't resolved with Tylenol given at 0625. Dr said they will be rounding on pt shortly. Will relay information to pt and will continue to monitor. Bed remains in lowest position.

## 2015-09-24 NOTE — Discharge Summary (Signed)
Name: [name] [name] MRN: [id number] DOB: 02-01-1941 75 y.o. PCP: Leonides Sake, MD  Date of Admission: 09/23/2015 12:03 PM Date of Discharge: 09/24/2015 Attending Physician: Oval Linsey, MD  Discharge Diagnosis: Principal Problem Mild COPD Exacerbation Active Problems UTI in Setting of Hx of Hematuria Paroxysmal AFib HTN GERD Normocytic Anemia Hx DVT/PE Urinary Incontinence   Discharge Medications:   Medication List    STOP taking these medications        aspirin 500 MG EC tablet     Iron 325 (65 Fe) MG Tabs      TAKE these medications        VENTOLIN HFA 108 (90 Base) MCG/ACT inhaler  Generic drug:  albuterol  Inhale 2 puffs into the lungs 4 (four) times daily as needed for wheezing or shortness of breath.     albuterol (2.5 MG/3ML) 0.083% nebulizer solution  Commonly known as:  PROVENTIL  Take 2.5 mg by nebulization every 6 (six) hours as needed for wheezing or shortness of breath.     atorvastatin 40 MG tablet  Commonly known as:  LIPITOR  Take 40 mg by mouth every morning.     AZO CRANBERRY PO  Take 2 capsules by mouth daily.     cyanocobalamin 500 MCG tablet  Take 500 mcg by mouth daily.     diltiazem 240 MG 24 hr capsule  Commonly known as:  CARDIZEM CD  Take 240 mg by mouth every morning.     docusate sodium 100 MG capsule  Commonly known as:  COLACE  Take 100 mg by mouth daily as needed (constipation).     Fish Oil 1000 MG Caps  Take 1,000 mg by mouth daily.     fluticasone 50 MCG/ACT nasal spray  Commonly known as:  FLONASE  Place 1 spray into both nostrils daily as needed for allergies or rhinitis.     furosemide 20 MG tablet  Commonly known as:  LASIX  Take 1 tablet (20 mg total) by mouth daily.     guaiFENesin 600 MG 12 hr tablet  Commonly known as:  MUCINEX  Take 600 mg by mouth 2 (two) times daily as needed for cough. Reported on 06/17/2015     metoprolol succinate 25 MG 24 hr tablet  Commonly known as:  TOPROL-XL  Take  12.5 mg by mouth every morning.     oxybutynin 5 MG 24 hr tablet  Commonly known as:  DITROPAN-XL  Take 10 mg by mouth at bedtime.     pantoprazole 40 MG tablet  Commonly known as:  PROTONIX  Take 1 tablet (40 mg total) by mouth daily.     predniSONE 20 MG tablet  Commonly known as:  DELTASONE  Take 2 tablets (40 mg total) by mouth daily with breakfast.  Start taking on:  09/25/2015     rivaroxaban 20 MG Tabs tablet  Commonly known as:  XARELTO  Take 20 mg by mouth daily with supper.     sertraline 25 MG tablet  Commonly known as:  ZOLOFT  Take 1 tablet (25 mg total) by mouth at bedtime.     sulfamethoxazole-trimethoprim 800-160 MG tablet  Commonly known as:  BACTRIM DS,SEPTRA DS  Take 1 tablet by mouth every 12 (twelve) hours.     tiotropium 18 MCG inhalation capsule  Commonly known as:  SPIRIVA  Place 1 capsule (18 mcg total) into inhaler and inhale daily.     tiZANidine 2 MG tablet  Commonly known as:  ZANAFLEX  Take 2 mg by mouth at bedtime.        Disposition and follow-up:   [name] [name] was discharged from [facility] in Good condition.  At the hospital follow up visit please address:  1.  COPD- Ask if she completed the prednisone course. Determine if she is taking Zoloft, prescribed to help with anxiety/hyperdynamic airway compression. UTI/Hematuria- Ensure she completed the Bactrim course (last day 7/16). Will need to go back to Urology for her hematuria.   2.  Labs / imaging needed at time of follow-up: None   3.  Pending labs/ test needing follow-up: None  Follow-up Appointments:     Follow-up Information    Follow up with [facility] On 10/08/2015.   Why:  Hospital follow up appointment on 10/08/15 at 10:45 AM   Contact information:   [address] [place] [facility] Course by problem list:  COPD Exacerbation: Patient presented with a 1-2 week hx of  worsening SOB to the point she was unable to ambulate. She wears 2 L oxygen at home at baseline and tried breathing treatments without improvement. She did not have increased sputum production or increased cough. She was treated for a mild COPD exacerbation with Duonebs and Prednisone 40 mg daily. Antibiotics were felt to not be indicated. By the following day, her breathing had improved significantly and she was discharged home to complete Prednisone for a total of 5 days.   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 Her UA had TNTC RBCs, small leuks, and 6-30 WBCs. Her Hgb was stable at 12.3. Patient also complained of dysuria. She was treated with Bactrim for a 5 day course (to be completed on 7/16). She will need referral back to Urology to follow up her hematuria.    Discharge Vitals:   BP 128/51 mmHg  Pulse 102  Temp(Src) 97.4 F (36.3 C) (Oral)  Resp 18  Ht 5\' 5"  (1.651 m)  Wt 139 lb 8 oz (63.277 kg)  BMI 23.21 kg/m2  SpO2 96% Physical Exam General: frail elderly woman sitting up in bed, NAD HEENT: [place]/AT, EOMI, sclera anicteric, mucus membranes moist CV: RRR, no m/g/r Pulm: CTA bilaterally, breaths non-labored on 2 L oxygen via [place] Abd: BS+, soft, non-tender Ext: warm, no edema Neuro: alert and oriented x 3  Pertinent Labs, Studies, and Procedures:  CXR 7/11: No acute abnormalities.   Discharge Instructions: Discharge Instructions    Diet - low sodium heart healthy    Complete by:  As directed      Discharge instructions    Complete by:  As directed   It was a pleasure taking care of you, [name] [name]. You were hospitalized for a COPD exacerbation.  1. Take Prednisone 40 mg daily starting tomorrow. You will take this for a total of 3 days. 2. Continue using your inhalers (Spiriva daily and Albuterol as needed) 3. We started you on Zoloft 25 mg daily at bedtime which will help you  with your breathing when you suddenly feel short of breath 4. Please complete Bactrim 800-60 mg twice daily for 5 days total. Take your next dose tonight.  5. You have a follow up appointment scheduled in our Internal Medicine Clinic located on the ground floor of the hospital on July 26th at 10:45 AM. Please arrive  15 minutes early. We will be your new primary care doctor.   Take care,  Dr. Arcelia Jew     Increase activity slowly    Complete by:  As directed            Signed: Juliet Rude, MD 09/24/2015, 3:19 PM   Pager: [phone]

## 2015-09-24 NOTE — Progress Notes (Signed)
Internal Medicine Attending  Date: 09/24/2015  Patient name: [name] [name] Medical record number: [id number] Date of birth: 1940/10/21 Age: 75 y.o. Gender: female  I saw and evaluated the patient. I reviewed the resident's note by Dr. Arcelia Jew and I agree with the resident's findings and plans as documented in her progress note.  Please see my H&P dated 09/24/2015 fit specifics of my evaluation, assessment, and plan from earlier today.

## 2015-09-25 DIAGNOSIS — Z7901 Long term (current) use of anticoagulants: Secondary | ICD-10-CM | POA: Diagnosis not present

## 2015-09-25 DIAGNOSIS — N189 Chronic kidney disease, unspecified: Secondary | ICD-10-CM | POA: Diagnosis not present

## 2015-09-25 DIAGNOSIS — J45909 Unspecified asthma, uncomplicated: Secondary | ICD-10-CM | POA: Diagnosis not present

## 2015-09-25 DIAGNOSIS — R279 Unspecified lack of coordination: Secondary | ICD-10-CM | POA: Diagnosis not present

## 2015-09-25 DIAGNOSIS — I2510 Atherosclerotic heart disease of native coronary artery without angina pectoris: Secondary | ICD-10-CM | POA: Diagnosis not present

## 2015-09-25 DIAGNOSIS — J449 Chronic obstructive pulmonary disease, unspecified: Secondary | ICD-10-CM | POA: Diagnosis not present

## 2015-09-25 DIAGNOSIS — I129 Hypertensive chronic kidney disease with stage 1 through stage 4 chronic kidney disease, or unspecified chronic kidney disease: Secondary | ICD-10-CM | POA: Diagnosis not present

## 2015-09-25 DIAGNOSIS — Z9981 Dependence on supplemental oxygen: Secondary | ICD-10-CM | POA: Diagnosis not present

## 2015-09-25 NOTE — Telephone Encounter (Signed)
Spoke with pharmacy who said that they got it figured out last night.

## 2015-09-26 DIAGNOSIS — Z7901 Long term (current) use of anticoagulants: Secondary | ICD-10-CM | POA: Diagnosis not present

## 2015-09-26 DIAGNOSIS — N189 Chronic kidney disease, unspecified: Secondary | ICD-10-CM | POA: Diagnosis not present

## 2015-09-26 DIAGNOSIS — I2510 Atherosclerotic heart disease of native coronary artery without angina pectoris: Secondary | ICD-10-CM | POA: Diagnosis not present

## 2015-09-26 DIAGNOSIS — J45909 Unspecified asthma, uncomplicated: Secondary | ICD-10-CM | POA: Diagnosis not present

## 2015-09-26 DIAGNOSIS — Z9981 Dependence on supplemental oxygen: Secondary | ICD-10-CM | POA: Diagnosis not present

## 2015-09-26 DIAGNOSIS — J449 Chronic obstructive pulmonary disease, unspecified: Secondary | ICD-10-CM | POA: Diagnosis not present

## 2015-09-26 DIAGNOSIS — I129 Hypertensive chronic kidney disease with stage 1 through stage 4 chronic kidney disease, or unspecified chronic kidney disease: Secondary | ICD-10-CM | POA: Diagnosis not present

## 2015-09-26 DIAGNOSIS — R279 Unspecified lack of coordination: Secondary | ICD-10-CM | POA: Diagnosis not present

## 2015-09-29 ENCOUNTER — Telehealth: Payer: Self-pay | Admitting: Internal Medicine

## 2015-09-29 NOTE — Telephone Encounter (Signed)
Needs TOC Discharge date 09/24/15 HFU 10/08/15

## 2015-09-30 DIAGNOSIS — J449 Chronic obstructive pulmonary disease, unspecified: Secondary | ICD-10-CM | POA: Diagnosis not present

## 2015-09-30 DIAGNOSIS — Z9981 Dependence on supplemental oxygen: Secondary | ICD-10-CM | POA: Diagnosis not present

## 2015-09-30 DIAGNOSIS — R279 Unspecified lack of coordination: Secondary | ICD-10-CM | POA: Diagnosis not present

## 2015-09-30 DIAGNOSIS — I2510 Atherosclerotic heart disease of native coronary artery without angina pectoris: Secondary | ICD-10-CM | POA: Diagnosis not present

## 2015-09-30 DIAGNOSIS — I129 Hypertensive chronic kidney disease with stage 1 through stage 4 chronic kidney disease, or unspecified chronic kidney disease: Secondary | ICD-10-CM | POA: Diagnosis not present

## 2015-09-30 DIAGNOSIS — N189 Chronic kidney disease, unspecified: Secondary | ICD-10-CM | POA: Diagnosis not present

## 2015-09-30 DIAGNOSIS — Z7901 Long term (current) use of anticoagulants: Secondary | ICD-10-CM | POA: Diagnosis not present

## 2015-09-30 DIAGNOSIS — J45909 Unspecified asthma, uncomplicated: Secondary | ICD-10-CM | POA: Diagnosis not present

## 2015-10-01 DIAGNOSIS — I129 Hypertensive chronic kidney disease with stage 1 through stage 4 chronic kidney disease, or unspecified chronic kidney disease: Secondary | ICD-10-CM | POA: Diagnosis not present

## 2015-10-01 DIAGNOSIS — Z7901 Long term (current) use of anticoagulants: Secondary | ICD-10-CM | POA: Diagnosis not present

## 2015-10-01 DIAGNOSIS — I2510 Atherosclerotic heart disease of native coronary artery without angina pectoris: Secondary | ICD-10-CM | POA: Diagnosis not present

## 2015-10-01 DIAGNOSIS — J45909 Unspecified asthma, uncomplicated: Secondary | ICD-10-CM | POA: Diagnosis not present

## 2015-10-01 DIAGNOSIS — J449 Chronic obstructive pulmonary disease, unspecified: Secondary | ICD-10-CM | POA: Diagnosis not present

## 2015-10-01 DIAGNOSIS — Z9981 Dependence on supplemental oxygen: Secondary | ICD-10-CM | POA: Diagnosis not present

## 2015-10-01 DIAGNOSIS — N189 Chronic kidney disease, unspecified: Secondary | ICD-10-CM | POA: Diagnosis not present

## 2015-10-01 DIAGNOSIS — R279 Unspecified lack of coordination: Secondary | ICD-10-CM | POA: Diagnosis not present

## 2015-10-02 DIAGNOSIS — Z9981 Dependence on supplemental oxygen: Secondary | ICD-10-CM | POA: Diagnosis not present

## 2015-10-02 DIAGNOSIS — J449 Chronic obstructive pulmonary disease, unspecified: Secondary | ICD-10-CM | POA: Diagnosis not present

## 2015-10-02 DIAGNOSIS — R279 Unspecified lack of coordination: Secondary | ICD-10-CM | POA: Diagnosis not present

## 2015-10-02 DIAGNOSIS — J45909 Unspecified asthma, uncomplicated: Secondary | ICD-10-CM | POA: Diagnosis not present

## 2015-10-02 DIAGNOSIS — I129 Hypertensive chronic kidney disease with stage 1 through stage 4 chronic kidney disease, or unspecified chronic kidney disease: Secondary | ICD-10-CM | POA: Diagnosis not present

## 2015-10-02 DIAGNOSIS — N189 Chronic kidney disease, unspecified: Secondary | ICD-10-CM | POA: Diagnosis not present

## 2015-10-02 DIAGNOSIS — Z7901 Long term (current) use of anticoagulants: Secondary | ICD-10-CM | POA: Diagnosis not present

## 2015-10-02 DIAGNOSIS — I2510 Atherosclerotic heart disease of native coronary artery without angina pectoris: Secondary | ICD-10-CM | POA: Diagnosis not present

## 2015-10-03 NOTE — Telephone Encounter (Signed)
Transition Care Management Follow-up Telephone Call   Date discharged? 09/24/2015   How have you been since you were released from the hospital? "I'm feeling better"   Do you understand why you were in the hospital? yes   Do you understand the discharge instructions? Yes.   Where were you discharged to? home   Items Reviewed:  Medications reviewed: yes. I picked up all my medicines and I'm finished with them now  Allergies reviewed: yes  Dietary changes reviewed: no  Referrals reviewed: no   Functional Questionnaire:   Activities of Daily Living (ADLs):   She states they are independent in the following: ambulation, bathing and hygiene, feeding, continence, grooming, toileting and dressing States they require assistance with the following: none   Any transportation issues/concerns?: yes, "I'm not sure I'm going to be able to come to my appointment"   Any patient concerns? Yes. Unsure if she will have transportation for appointment. Encouraged patient to call by Tuesday and reschedule if needed.   Confirmed importance and date/time of follow-up visits scheduled yes  Provider Appointment booked with 10/08/2015 at 10:45  Confirmed with patient if condition begins to worsen call PCP or go to the ER.  Patient was given the office number and encouraged to call back with question or concerns.  : yes

## 2015-10-06 DIAGNOSIS — I10 Essential (primary) hypertension: Secondary | ICD-10-CM | POA: Diagnosis not present

## 2015-10-06 DIAGNOSIS — R7303 Prediabetes: Secondary | ICD-10-CM | POA: Diagnosis not present

## 2015-10-06 DIAGNOSIS — R609 Edema, unspecified: Secondary | ICD-10-CM | POA: Diagnosis not present

## 2015-10-06 DIAGNOSIS — Z79899 Other long term (current) drug therapy: Secondary | ICD-10-CM | POA: Diagnosis not present

## 2015-10-06 DIAGNOSIS — I2510 Atherosclerotic heart disease of native coronary artery without angina pectoris: Secondary | ICD-10-CM | POA: Diagnosis not present

## 2015-10-06 DIAGNOSIS — Z7901 Long term (current) use of anticoagulants: Secondary | ICD-10-CM | POA: Diagnosis not present

## 2015-10-06 DIAGNOSIS — E7800 Pure hypercholesterolemia, unspecified: Secondary | ICD-10-CM | POA: Diagnosis not present

## 2015-10-06 DIAGNOSIS — K219 Gastro-esophageal reflux disease without esophagitis: Secondary | ICD-10-CM | POA: Diagnosis not present

## 2015-10-06 DIAGNOSIS — E538 Deficiency of other specified B group vitamins: Secondary | ICD-10-CM | POA: Diagnosis not present

## 2015-10-06 DIAGNOSIS — R279 Unspecified lack of coordination: Secondary | ICD-10-CM | POA: Diagnosis not present

## 2015-10-06 DIAGNOSIS — N189 Chronic kidney disease, unspecified: Secondary | ICD-10-CM | POA: Diagnosis not present

## 2015-10-06 DIAGNOSIS — J449 Chronic obstructive pulmonary disease, unspecified: Secondary | ICD-10-CM | POA: Diagnosis not present

## 2015-10-06 DIAGNOSIS — I480 Paroxysmal atrial fibrillation: Secondary | ICD-10-CM | POA: Diagnosis not present

## 2015-10-06 DIAGNOSIS — Z9981 Dependence on supplemental oxygen: Secondary | ICD-10-CM | POA: Diagnosis not present

## 2015-10-06 DIAGNOSIS — J45909 Unspecified asthma, uncomplicated: Secondary | ICD-10-CM | POA: Diagnosis not present

## 2015-10-06 DIAGNOSIS — D509 Iron deficiency anemia, unspecified: Secondary | ICD-10-CM | POA: Diagnosis not present

## 2015-10-06 DIAGNOSIS — I129 Hypertensive chronic kidney disease with stage 1 through stage 4 chronic kidney disease, or unspecified chronic kidney disease: Secondary | ICD-10-CM | POA: Diagnosis not present

## 2015-10-07 DIAGNOSIS — Z7901 Long term (current) use of anticoagulants: Secondary | ICD-10-CM | POA: Diagnosis not present

## 2015-10-07 DIAGNOSIS — Z9981 Dependence on supplemental oxygen: Secondary | ICD-10-CM | POA: Diagnosis not present

## 2015-10-07 DIAGNOSIS — N189 Chronic kidney disease, unspecified: Secondary | ICD-10-CM | POA: Diagnosis not present

## 2015-10-07 DIAGNOSIS — I2510 Atherosclerotic heart disease of native coronary artery without angina pectoris: Secondary | ICD-10-CM | POA: Diagnosis not present

## 2015-10-07 DIAGNOSIS — R279 Unspecified lack of coordination: Secondary | ICD-10-CM | POA: Diagnosis not present

## 2015-10-07 DIAGNOSIS — J449 Chronic obstructive pulmonary disease, unspecified: Secondary | ICD-10-CM | POA: Diagnosis not present

## 2015-10-07 DIAGNOSIS — J45909 Unspecified asthma, uncomplicated: Secondary | ICD-10-CM | POA: Diagnosis not present

## 2015-10-07 DIAGNOSIS — I129 Hypertensive chronic kidney disease with stage 1 through stage 4 chronic kidney disease, or unspecified chronic kidney disease: Secondary | ICD-10-CM | POA: Diagnosis not present

## 2015-10-08 ENCOUNTER — Ambulatory Visit: Payer: Commercial Managed Care - HMO

## 2015-10-09 DIAGNOSIS — I2510 Atherosclerotic heart disease of native coronary artery without angina pectoris: Secondary | ICD-10-CM | POA: Diagnosis not present

## 2015-10-09 DIAGNOSIS — R279 Unspecified lack of coordination: Secondary | ICD-10-CM | POA: Diagnosis not present

## 2015-10-09 DIAGNOSIS — J45909 Unspecified asthma, uncomplicated: Secondary | ICD-10-CM | POA: Diagnosis not present

## 2015-10-09 DIAGNOSIS — Z9981 Dependence on supplemental oxygen: Secondary | ICD-10-CM | POA: Diagnosis not present

## 2015-10-09 DIAGNOSIS — Z7901 Long term (current) use of anticoagulants: Secondary | ICD-10-CM | POA: Diagnosis not present

## 2015-10-09 DIAGNOSIS — J449 Chronic obstructive pulmonary disease, unspecified: Secondary | ICD-10-CM | POA: Diagnosis not present

## 2015-10-09 DIAGNOSIS — I129 Hypertensive chronic kidney disease with stage 1 through stage 4 chronic kidney disease, or unspecified chronic kidney disease: Secondary | ICD-10-CM | POA: Diagnosis not present

## 2015-10-09 DIAGNOSIS — N189 Chronic kidney disease, unspecified: Secondary | ICD-10-CM | POA: Diagnosis not present

## 2015-10-10 DIAGNOSIS — M545 Low back pain: Secondary | ICD-10-CM | POA: Diagnosis not present

## 2015-10-13 DIAGNOSIS — J45909 Unspecified asthma, uncomplicated: Secondary | ICD-10-CM | POA: Diagnosis not present

## 2015-10-13 DIAGNOSIS — N189 Chronic kidney disease, unspecified: Secondary | ICD-10-CM | POA: Diagnosis not present

## 2015-10-13 DIAGNOSIS — J449 Chronic obstructive pulmonary disease, unspecified: Secondary | ICD-10-CM | POA: Diagnosis not present

## 2015-10-13 DIAGNOSIS — Z9981 Dependence on supplemental oxygen: Secondary | ICD-10-CM | POA: Diagnosis not present

## 2015-10-13 DIAGNOSIS — R279 Unspecified lack of coordination: Secondary | ICD-10-CM | POA: Diagnosis not present

## 2015-10-13 DIAGNOSIS — Z7901 Long term (current) use of anticoagulants: Secondary | ICD-10-CM | POA: Diagnosis not present

## 2015-10-13 DIAGNOSIS — I2510 Atherosclerotic heart disease of native coronary artery without angina pectoris: Secondary | ICD-10-CM | POA: Diagnosis not present

## 2015-10-13 DIAGNOSIS — I129 Hypertensive chronic kidney disease with stage 1 through stage 4 chronic kidney disease, or unspecified chronic kidney disease: Secondary | ICD-10-CM | POA: Diagnosis not present

## 2015-10-14 DIAGNOSIS — Z9981 Dependence on supplemental oxygen: Secondary | ICD-10-CM | POA: Diagnosis not present

## 2015-10-14 DIAGNOSIS — N189 Chronic kidney disease, unspecified: Secondary | ICD-10-CM | POA: Diagnosis not present

## 2015-10-14 DIAGNOSIS — I129 Hypertensive chronic kidney disease with stage 1 through stage 4 chronic kidney disease, or unspecified chronic kidney disease: Secondary | ICD-10-CM | POA: Diagnosis not present

## 2015-10-14 DIAGNOSIS — I2510 Atherosclerotic heart disease of native coronary artery without angina pectoris: Secondary | ICD-10-CM | POA: Diagnosis not present

## 2015-10-14 DIAGNOSIS — R279 Unspecified lack of coordination: Secondary | ICD-10-CM | POA: Diagnosis not present

## 2015-10-14 DIAGNOSIS — J449 Chronic obstructive pulmonary disease, unspecified: Secondary | ICD-10-CM | POA: Diagnosis not present

## 2015-10-14 DIAGNOSIS — J45909 Unspecified asthma, uncomplicated: Secondary | ICD-10-CM | POA: Diagnosis not present

## 2015-10-14 DIAGNOSIS — Z7901 Long term (current) use of anticoagulants: Secondary | ICD-10-CM | POA: Diagnosis not present

## 2015-10-17 DIAGNOSIS — I2510 Atherosclerotic heart disease of native coronary artery without angina pectoris: Secondary | ICD-10-CM | POA: Diagnosis not present

## 2015-10-17 DIAGNOSIS — J45909 Unspecified asthma, uncomplicated: Secondary | ICD-10-CM | POA: Diagnosis not present

## 2015-10-17 DIAGNOSIS — R279 Unspecified lack of coordination: Secondary | ICD-10-CM | POA: Diagnosis not present

## 2015-10-17 DIAGNOSIS — Z9981 Dependence on supplemental oxygen: Secondary | ICD-10-CM | POA: Diagnosis not present

## 2015-10-17 DIAGNOSIS — I129 Hypertensive chronic kidney disease with stage 1 through stage 4 chronic kidney disease, or unspecified chronic kidney disease: Secondary | ICD-10-CM | POA: Diagnosis not present

## 2015-10-17 DIAGNOSIS — N189 Chronic kidney disease, unspecified: Secondary | ICD-10-CM | POA: Diagnosis not present

## 2015-10-17 DIAGNOSIS — J449 Chronic obstructive pulmonary disease, unspecified: Secondary | ICD-10-CM | POA: Diagnosis not present

## 2015-10-17 DIAGNOSIS — Z7901 Long term (current) use of anticoagulants: Secondary | ICD-10-CM | POA: Diagnosis not present

## 2015-10-20 DIAGNOSIS — N189 Chronic kidney disease, unspecified: Secondary | ICD-10-CM | POA: Diagnosis not present

## 2015-10-20 DIAGNOSIS — I129 Hypertensive chronic kidney disease with stage 1 through stage 4 chronic kidney disease, or unspecified chronic kidney disease: Secondary | ICD-10-CM | POA: Diagnosis not present

## 2015-10-20 DIAGNOSIS — J45909 Unspecified asthma, uncomplicated: Secondary | ICD-10-CM | POA: Diagnosis not present

## 2015-10-20 DIAGNOSIS — J449 Chronic obstructive pulmonary disease, unspecified: Secondary | ICD-10-CM | POA: Diagnosis not present

## 2015-10-20 DIAGNOSIS — Z7901 Long term (current) use of anticoagulants: Secondary | ICD-10-CM | POA: Diagnosis not present

## 2015-10-20 DIAGNOSIS — I2510 Atherosclerotic heart disease of native coronary artery without angina pectoris: Secondary | ICD-10-CM | POA: Diagnosis not present

## 2015-10-20 DIAGNOSIS — R279 Unspecified lack of coordination: Secondary | ICD-10-CM | POA: Diagnosis not present

## 2015-10-20 DIAGNOSIS — Z9981 Dependence on supplemental oxygen: Secondary | ICD-10-CM | POA: Diagnosis not present

## 2015-10-21 ENCOUNTER — Encounter (HOSPITAL_COMMUNITY): Payer: Self-pay | Admitting: Emergency Medicine

## 2015-10-21 ENCOUNTER — Emergency Department (HOSPITAL_COMMUNITY): Payer: Commercial Managed Care - HMO

## 2015-10-21 ENCOUNTER — Emergency Department (HOSPITAL_COMMUNITY)
Admission: EM | Admit: 2015-10-21 | Discharge: 2015-10-21 | Disposition: A | Discharge status: Home / Self Care | Payer: Commercial Managed Care - HMO | Attending: Emergency Medicine | Admitting: Emergency Medicine

## 2015-10-21 DIAGNOSIS — S32000A Wedge compression fracture of unspecified lumbar vertebra, initial encounter for closed fracture: Secondary | ICD-10-CM

## 2015-10-21 DIAGNOSIS — S32010A Wedge compression fracture of first lumbar vertebra, initial encounter for closed fracture: Secondary | ICD-10-CM | POA: Diagnosis not present

## 2015-10-21 DIAGNOSIS — I129 Hypertensive chronic kidney disease with stage 1 through stage 4 chronic kidney disease, or unspecified chronic kidney disease: Secondary | ICD-10-CM | POA: Diagnosis not present

## 2015-10-21 DIAGNOSIS — Z87891 Personal history of nicotine dependence: Secondary | ICD-10-CM | POA: Diagnosis not present

## 2015-10-21 DIAGNOSIS — M545 Low back pain: Secondary | ICD-10-CM | POA: Diagnosis present

## 2015-10-21 DIAGNOSIS — Z7982 Long term (current) use of aspirin: Secondary | ICD-10-CM | POA: Diagnosis not present

## 2015-10-21 DIAGNOSIS — J441 Chronic obstructive pulmonary disease with (acute) exacerbation: Secondary | ICD-10-CM | POA: Diagnosis not present

## 2015-10-21 DIAGNOSIS — Y939 Activity, unspecified: Secondary | ICD-10-CM | POA: Insufficient documentation

## 2015-10-21 DIAGNOSIS — Y999 Unspecified external cause status: Secondary | ICD-10-CM | POA: Insufficient documentation

## 2015-10-21 DIAGNOSIS — S32018A Other fracture of first lumbar vertebra, initial encounter for closed fracture: Secondary | ICD-10-CM | POA: Diagnosis not present

## 2015-10-21 DIAGNOSIS — Z79899 Other long term (current) drug therapy: Secondary | ICD-10-CM | POA: Diagnosis not present

## 2015-10-21 DIAGNOSIS — Z7901 Long term (current) use of anticoagulants: Secondary | ICD-10-CM | POA: Diagnosis not present

## 2015-10-21 DIAGNOSIS — X58XXXA Exposure to other specified factors, initial encounter: Secondary | ICD-10-CM | POA: Insufficient documentation

## 2015-10-21 DIAGNOSIS — N189 Chronic kidney disease, unspecified: Secondary | ICD-10-CM | POA: Diagnosis not present

## 2015-10-21 DIAGNOSIS — M5489 Other dorsalgia: Secondary | ICD-10-CM | POA: Diagnosis not present

## 2015-10-21 DIAGNOSIS — Y929 Unspecified place or not applicable: Secondary | ICD-10-CM | POA: Insufficient documentation

## 2015-10-21 DIAGNOSIS — I2510 Atherosclerotic heart disease of native coronary artery without angina pectoris: Secondary | ICD-10-CM | POA: Diagnosis not present

## 2015-10-21 DIAGNOSIS — R1033 Periumbilical pain: Secondary | ICD-10-CM | POA: Diagnosis not present

## 2015-10-21 DIAGNOSIS — M546 Pain in thoracic spine: Secondary | ICD-10-CM | POA: Diagnosis not present

## 2015-10-21 DIAGNOSIS — IMO0002 Reserved for concepts with insufficient information to code with codable children: Secondary | ICD-10-CM

## 2015-10-21 MED ORDER — IPRATROPIUM-ALBUTEROL 0.5-2.5 (3) MG/3ML IN SOLN
3.0000 mL | RESPIRATORY_TRACT | Status: DC
  Administered 2015-10-21: 3 mL via RESPIRATORY_TRACT
  Filled 2015-10-21: qty 3

## 2015-10-21 MED ORDER — HYDROCODONE-ACETAMINOPHEN 5-325 MG PO TABS: 1.0000 | ORAL_TABLET | ORAL | 0 refills | Status: DC | PRN

## 2015-10-21 MED ORDER — HYDROCODONE-ACETAMINOPHEN 5-325 MG PO TABS
1.0000 | ORAL_TABLET | Freq: Once | ORAL | Status: AC
  Administered 2015-10-21: 1 via ORAL
  Filled 2015-10-21: qty 1

## 2015-10-21 MED ORDER — DOCUSATE SODIUM 100 MG PO CAPS: 100.0000 mg | ORAL_CAPSULE | Freq: Two times a day (BID) | ORAL | 0 refills | Status: AC

## 2015-10-21 NOTE — ED Notes (Signed)
Pt transported to CT ?

## 2015-10-21 NOTE — ED Notes (Signed)
Placed patient into a gown and on the monitor did a ekg shown to dr Jeneen Rinks

## 2015-10-21 NOTE — ED Provider Notes (Signed)
[facility] DEPT Provider Note   CSN: [id number] Arrival date & time: 10/21/15  1033  First Provider Contact:  First MD Initiated Contact with Patient 10/21/15 1041        History   Chief Complaint Chief Complaint  Patient presents with  . Back Pain    HPI [name] [name] is a 75 y.o. female.  History of COPD. Multiple doses of steroids over the last few months with exacerbations. Said back pain for the last month. Worse for the last several days midline low back. No radiation. No chest pain or shortness of breath today. No bowel or bladder incontinence no numbness weakness tingling or radiation of pain to her extremities. No fall injury or trauma.  HPI  Past Medical History:  Diagnosis Date  . Anemia   . Arthritis    "back" (11/21/2014)  . Asthma   . Blood dyscrasia    hx blood clotts  . Chronic bronchitis ([place])   . Chronic kidney disease   . Chronic lower back pain   . 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 GERD (gastroesophageal reflux disease)   . [id number])    "a few times/year" (11/21/2014)  . History of blood transfusion X 2   "related to blood thinner I was taking"  . Hyperlipidemia   . Hypertension   . Migraine    "weekly probably" (11/21/2014)  . On home oxygen therapy    "3L; 24/7" (11/21/2014)  . Pneumonia    hx pneumonia   . Pneumothorax on right 09/2014  . Pulmonary embolism ([place])   . Shortness of breath   . Urinary incontinence     Patient Active Problem List   Diagnosis Date Noted  . Pneumothorax, right 11/20/2014  . Pneumothorax   . Recurrent spontaneous pneumothorax   . Chest tube in place   . Pneumothorax on right 10/09/2014  . Pneumothorax on left 09/14/2014  . Gross hematuria   . Hematuria 06/10/2014  . Cystitis, acute hemorrhagic 06/08/2014  . Hematochezia 06/08/2014  . Acute pyelonephritis 04/28/2014  . COLD (chronic obstructive lung disease) ([place])   . Essential hypertension   . CAP (community  acquired pneumonia) 04/27/2014  . SOB (shortness of breath) 04/27/2014  . Microcytic anemia 04/27/2014  . Hemorrhagic cystitis 08/03/2013  . Anemia due to acute blood loss 08/03/2013  . COPD exacerbation ([place]) 07/08/2013  . COPD with acute exacerbation ([place]) 07/08/2013  . Paroxysmal atrial fibrillation ([place]) 02/03/2012  . UTI (lower urinary tract infection) 02/02/2012  . Thrombocytopenia ([place]) 02/02/2012  . Fracture of right hip ([place]) 01/29/2012  . Multiple rib fractures 01/29/2012  . Fall due to stumbling 01/29/2012  . CAD (coronary artery disease) 01/29/2012  . Hypertension 01/29/2012  . COPD (chronic obstructive pulmonary disease) ([place]) 01/29/2012  . CKD (chronic kidney disease) 01/29/2012  . Osteoarthritis 01/29/2012  . Closed fracture of right distal radius 10/13/2011    Class: Acute    Past Surgical History:  Procedure Laterality Date  . ABDOMINAL HYSTERECTOMY    . CATARACT EXTRACTION W/ INTRAOCULAR LENS  IMPLANT, BILATERAL Bilateral   . CYSTOSCOPY W/ RETROGRADES Bilateral 06/11/2014   Procedure: CYSTOSCOPY WITH RETROGRADE PYELOGRAM;  Surgeon: [name], MD;  Location: [facility];  Service: Urology;  Laterality: Bilateral;  . FRACTURE SURGERY    . HARDWARE REMOVAL  01/29/2012   Procedure: HARDWARE REMOVAL;  Surgeon: [name], MD;  Location: [facility];  Service: Orthopedics;  Laterality: Right;  . HEMIARTHROPLASTY SHOULDER FRACTURE  Right   . HIP ARTHROPLASTY  01/29/2012   Procedure: ARTHROPLASTY BIPOLAR HIP;  Surgeon: [name], MD;  Location: [facility];  Service: Orthopedics;  Laterality: Right;  . LAPAROSCOPIC CHOLECYSTECTOMY    . ORIF WRIST FRACTURE  10/13/2011   Procedure: OPEN REDUCTION INTERNAL FIXATION (ORIF) WRIST FRACTURE;  Surgeon: [name], MD;  Location: [facility];  Service: Orthopedics;  Laterality: Right;  Open Reduction Internal Fixation Right Distal Radius   . TRANSURETHRAL RESECTION OF BLADDER TUMOR N/A 06/11/2014   Procedure: TRANSURETHRAL RESECTION OF  BLADDER TUMOR (TURBT);  Surgeon: [name], MD;  Location: [facility];  Service: Urology;  Laterality: N/A;  . TUBAL LIGATION      OB History    No data available       Home Medications    Prior to Admission medications   Medication Sig Start Date End Date Taking? Authorizing Provider  albuterol (PROVENTIL) (2.5 MG/3ML) 0.083% nebulizer solution Take 2.5 mg by nebulization every 6 (six) hours as needed for wheezing or shortness of breath.    Yes Historical Provider, MD  aspirin-sod bicarb-citric acid (ALKA-SELTZER) 325 MG TBEF tablet Take 325 mg by mouth every 6 (six) hours as needed (for stomach).   Yes Historical Provider, MD  atorvastatin (LIPITOR) 40 MG tablet Take 40 mg by mouth every morning.  07/02/13  Yes Historical Provider, MD  Cranberry-Vitamin C-Probiotic (AZO CRANBERRY PO) Take 2 capsules by mouth daily.   Yes Historical Provider, MD  cyanocobalamin 500 MCG tablet Take 500 mcg by mouth daily.   Yes Historical Provider, MD  diltiazem (CARDIZEM CD) 240 MG 24 hr capsule Take 240 mg by mouth every morning.  04/08/14  Yes Historical Provider, MD  fluticasone (FLONASE) 50 MCG/ACT nasal spray Place 1 spray into both nostrils daily as needed for allergies or rhinitis. 08/06/13  Yes Barton Dubois, MD  furosemide (LASIX) 20 MG tablet Take 1 tablet (20 mg total) by mouth daily. Patient taking differently: Take 20 mg by mouth daily as needed for fluid or edema.  06/13/14  Yes Theodis Blaze, MD  metoprolol succinate (TOPROL-XL) 25 MG 24 hr tablet Take 12.5 mg by mouth every morning. 07/21/15  Yes Historical Provider, MD  Omega-3 Fatty Acids (FISH OIL) 1000 MG CAPS Take 1,000 mg by mouth daily.    Yes Historical Provider, MD  oxybutynin (DITROPAN-XL) 5 MG 24 hr tablet Take 10 mg by mouth at bedtime.   Yes Historical Provider, MD  pantoprazole (PROTONIX) 40 MG tablet Take 1 tablet (40 mg total) by mouth daily. [id number]  Yes Delora Fuel, MD  rivaroxaban (XARELTO) 20 MG TABS tablet Take 20 mg by  mouth daily with supper.   Yes Historical Provider, MD  sertraline (ZOLOFT) 25 MG tablet Take 1 tablet (25 mg total) by mouth at bedtime. 09/24/15  Yes Juliet Rude, MD  tiotropium (SPIRIVA) 18 MCG inhalation capsule Place 1 capsule (18 mcg total) into inhaler and inhale daily. 08/06/13  Yes Barton Dubois, MD  tiZANidine (ZANAFLEX) 2 MG tablet Take 2 mg by mouth at bedtime.   Yes Historical Provider, MD  VENTOLIN HFA 108 (90 BASE) MCG/ACT inhaler Inhale 2 puffs into the lungs 4 (four) times daily as needed for wheezing or shortness of breath.  06/07/14  Yes Historical Provider, MD  docusate sodium (COLACE) 100 MG capsule Take 1 capsule (100 mg total) by mouth every 12 (twelve) hours. 10/21/15   Tanna Furry, MD  HYDROcodone-acetaminophen (NORCO/VICODIN) 5-325 MG tablet Take 1 tablet by mouth  every 4 (four) hours as needed. 10/21/15   Tanna Furry, MD  predniSONE (DELTASONE) 20 MG tablet Take 2 tablets (40 mg total) by mouth daily with breakfast. Patient not taking: Reported on 10/21/2015 09/25/15   Milagros Loll, MD  sulfamethoxazole-trimethoprim (BACTRIM DS,SEPTRA DS) 800-160 MG tablet Take 1 tablet by mouth every 12 (twelve) hours. Patient not taking: Reported on 10/21/2015 09/24/15   Juliet Rude, MD    Family History Family History  Problem Relation Age of Onset  . Cancer - Lung Mother   . Coronary artery disease Mother   . Coronary artery disease Sister   . Coronary artery disease Brother     Social History Social History  Substance Use Topics  . Smoking status: Former Smoker    Packs/day: 1.00    Years: 56.00    Types: Cigarettes    Quit date: 02/12/2014  . Smokeless tobacco: Never Used  . Alcohol use No     Allergies   Eggs or egg-derived products   Review of Systems Review of Systems  Constitutional: Negative for appetite change, chills, diaphoresis, fatigue and fever.  HENT: Negative for mouth sores, sore throat and trouble swallowing.   Eyes: Negative for visual disturbance.    Respiratory: Negative for cough, chest tightness, shortness of breath and wheezing.   Cardiovascular: Negative for chest pain.  Gastrointestinal: Negative for abdominal distention, abdominal pain, diarrhea, nausea and vomiting.  Endocrine: Negative for polydipsia, polyphagia and polyuria.  Genitourinary: Negative for dysuria, frequency and hematuria.  Musculoskeletal: Positive for back pain. Negative for gait problem.  Skin: Negative for color change, pallor and rash.  Neurological: Negative for dizziness, syncope, light-headedness and headaches.  Hematological: Does not bruise/bleed easily.  Psychiatric/Behavioral: Negative for behavioral problems and confusion.     Physical Exam Updated Vital Signs BP 154/60 (BP Location: Left Arm)   Pulse 82   Temp 98.8 F (37.1 C) (Oral)   Resp 14   SpO2 98%   Physical Exam  Constitutional: She is oriented to person, place, and time. She appears well-developed and well-nourished. No distress.  HENT:  Head: Normocephalic.  Eyes: Conjunctivae are normal. Pupils are equal, round, and reactive to light. No scleral icterus.  Neck: Normal range of motion. Neck supple. No thyromegaly present.  Cardiovascular: Normal rate and regular rhythm.  Exam reveals no gallop and no friction rub.   No murmur heard. Pulmonary/Chest: Effort normal and breath sounds normal. No respiratory distress. She has no wheezes. She has no rales.  Abdominal: Soft. Bowel sounds are normal. She exhibits no distension. There is no tenderness. There is no rebound.  Musculoskeletal: Normal range of motion.  Tenderness and midline low back.  Neurological: She is alert and oriented to person, place, and time.  Normal symmetric Strength to shoulder shrug, triceps, biceps, grip,wrist flex/extend,and intrinsics  Norma lsymmetric sensation above and below clavicles, and to all distributions to UEs. Norma symmetric strength to flex/.extend hip and knees, dorsi/plantar flex  ankles. Normal symmetric sensation to all distributions to LEs Patellar and achilles reflexes 1-2+. Downgoing Babinski   Skin: Skin is warm and dry. No rash noted.  Psychiatric: She has a normal mood and affect. Her behavior is normal.     ED Treatments / Results  Labs (all labs ordered are listed, but only abnormal results are displayed) Labs Reviewed - No data to display  EKG  EKG Interpretation None       Radiology Dg Thoracic Spine 2 View  Result Date: 10/21/2015 CLINICAL DATA:  Back pain EXAM: THORACIC SPINE 2 VIEWS COMPARISON:  Chest x-ray 09/23/2015 FINDINGS: L1 superior endplate deformity as described on dedicated lumbar radiography. This finding is new from 09/23/2015 chest x-ray. No thoracic spine fracture or subluxation is noted. Usual degenerative changes. Osteopenia. Chronic reticulation around lung sutures at the right apex. IMPRESSION: 1. No evidence of thoracic spine injury. 2. L1 superior endplate deformity, reference lumbar spine exam. Electronically Signed   By: Monte Fantasia M.D.   On: 10/21/2015 12:08   Dg Lumbar Spine Complete  Addendum Date: 10/21/2015   ADDENDUM REPORT: 10/21/2015 12:10 ADDENDUM: L1 fracture appears new since a chest radiograph dated 09/23/2015. Fracture should be considered acute/recent. Electronically Signed   By: Lajean Manes M.D.   On: 10/21/2015 12:10   Result Date: 10/21/2015 CLINICAL DATA:  Pt c/o back pain that affects the center and right and left sides of her back, beginning at the level of the inferior angle of the scapulae and extending down to the lumbar region. Pt states there was no injury and she's experienced the pain for 5 days. No injuries. EXAM: LUMBAR SPINE - COMPLETE 4+ VIEW COMPARISON:  CT, 06/08/2014 FINDINGS: There is a mild to moderate compression fracture of L1, new when compared to the prior CT. No other fractures. No spondylolisthesis. Mild loss of disc height at L4-L5 with moderate loss of disc height at L5-S1. Small  endplate osteophytes are noted throughout the lumbar spine. Bones are diffusely demineralized. Moderate increased stool is evident in the visualize colon. IMPRESSION: 1. Mild to moderate compression fracture of L1 new since the CT dated 06/08/2014. Further characterization of the chronicity of this fracture could be assessed with MRI. 2. No other fractures. No spondylolisthesis. Degenerative changes as described greatest at L5-S1. Electronically Signed: By: Lajean Manes M.D. On: 10/21/2015 12:06   Ct Lumbar Spine Wo Contrast  Result Date: 10/21/2015 CLINICAL DATA:  New compression fracture of L1 on standard radiographs. New or unexplained back pain. EXAM: CT LUMBAR SPINE WITHOUT CONTRAST TECHNIQUE: Multidetector CT imaging of the lumbar spine was performed without intravenous contrast administration. Multiplanar CT image reconstructions were also generated. COMPARISON:  Current lumbar spine radiographs. Previous CT dated 06/08/2014. FINDINGS: There is depression of the upper endplate of L1 reflecting a mild compression fracture which is new from the prior CT. It also appears new from a more recent chest radiograph, dated 09/23/2015. There are no other fractures. There is no spondylolisthesis. A Schmorl's node in density upper endplate of L4, stable. There is mild loss of disc height at L4-L5 with moderate loss disc height at L5-S1, also stable from the prior CT has all are anterior endplate osteophytes noted throughout most of the visualized spine. No bone lesion. T12-L1: The posterior upper endplate of the L1 vertebrae mildly indents the ventral thecal sac with out causing significant stenosis. The neural foramina are well preserved. There is no no surrounding hematoma or edema. The posterior elements are intact. L1-L2:  Unremarkable. L2-L3: Minor spondylotic disc bulging. No disc herniation. No stenosis. L3-L4: Minor spondylotic disc bulging. No disc herniation. No stenosis. L4-L5: Mild disc bulging. No disc  herniation. No significant stenosis. L5-S1: Mild like disc bulging with endplate osteophytes. No disc herniation. Central spinal canal and lateral recesses are well preserved. Mild bilateral neural foraminal narrowing greater on the left. Small fusiform infrarenal abdominal aortic aneurysm measures 2.7 cm in anterior-posterior dimension by 3.3 cm in length. This is without significant change from the prior CT. Atherosclerotic calcifications are noted throughout the visualized  abdominal aorta and the iliac vessels. Small low-density left adrenal mass consistent with an adenoma measuring 12 mm. No other masses. No adenopathy. IMPRESSION: 1. Mild compression fracture of L1. This is felt to be recent, not visualized on a chest radiograph dated 09/23/2015. There is no significant adjacent edema or hemorrhage. Fracture does not significantly encroach upon central spinal canal or the neural foramina. 2. No other fractures. 3. Degenerative changes as described greatest at L5-S1. 4. Small stable 2.7 cm fusiform infrarenal abdominal aortic aneurysm. Ectatic abdominal aorta at risk for aneurysm development. Recommend followup by ultrasound in 5 years. This recommendation follows ACR consensus guidelines: White Paper of the ACR Incidental Findings Committee II on Vascular Findings. J Am Coll Radiol 2013; 10:789-794. Electronically Signed   By: Lajean Manes M.D.   On: 10/21/2015 13:26    Procedures Procedures (including critical care time)  Medications Ordered in ED Medications  ipratropium-albuterol (DUONEB) 0.5-2.5 (3) MG/3ML nebulizer solution 3 mL (3 mLs Nebulization Given 10/21/15 1114)  HYDROcodone-acetaminophen (NORCO/VICODIN) 5-325 MG per tablet 1 tablet (1 tablet Oral Given 10/21/15 1114)  HYDROcodone-acetaminophen (NORCO/VICODIN) 5-325 MG per tablet 1 tablet (1 tablet Oral Given 10/21/15 1431)     Initial Impression / Assessment and Plan / ED Course  I have reviewed the triage vital signs and the nursing  notes.  Pertinent labs & imaging results that were available during my care of the patient were reviewed by me and considered in my medical decision making (see chart for details).  Clinical Course    Past compression fracture on plain film. CT shows no retropulsion. Fracture fragments to up but the thecal sac but do not displace it. She has no neurological symptoms. She is appropriate for discharge home. Her son is here with her. He will be able to assist her at home as well. Prescription for Vicodin and Colace. Primary care referral if not improving for discussion of potential for kyphoplasty  Final Clinical Impressions(s) / ED Diagnoses   Final diagnoses:  Compression fracture  Lumbar compression fracture, closed, initial encounter [facility])    New Prescriptions Discharge Medication List as of 10/21/2015  2:41 PM    START taking these medications   Details  HYDROcodone-acetaminophen (NORCO/VICODIN) 5-325 MG tablet Take 1 tablet by mouth every 4 (four) hours as needed., Starting Tue 10/21/2015, Print         Tanna Furry, MD 10/21/15 1754

## 2015-10-21 NOTE — ED Triage Notes (Addendum)
Pt from home via [facility] with c/o lower back pain radiating to mid back x 3 days with hx of the same.  Pt is always on 3 L nasal cannula, hx COPD.  NAD, A&O.

## 2015-10-21 NOTE — ED Notes (Signed)
Pt transported to xray 

## 2015-10-21 NOTE — ED Notes (Signed)
Pt returned from xray

## 2015-10-21 NOTE — Discharge Instructions (Signed)
Avoid upright activity as much as possible.  Follow-up with her primary care physician.  Colace as needed to avoid constipation from the pain medication.

## 2015-10-22 DIAGNOSIS — Z9981 Dependence on supplemental oxygen: Secondary | ICD-10-CM | POA: Diagnosis not present

## 2015-10-22 DIAGNOSIS — I2510 Atherosclerotic heart disease of native coronary artery without angina pectoris: Secondary | ICD-10-CM | POA: Diagnosis not present

## 2015-10-22 DIAGNOSIS — I129 Hypertensive chronic kidney disease with stage 1 through stage 4 chronic kidney disease, or unspecified chronic kidney disease: Secondary | ICD-10-CM | POA: Diagnosis not present

## 2015-10-22 DIAGNOSIS — R279 Unspecified lack of coordination: Secondary | ICD-10-CM | POA: Diagnosis not present

## 2015-10-22 DIAGNOSIS — N189 Chronic kidney disease, unspecified: Secondary | ICD-10-CM | POA: Diagnosis not present

## 2015-10-22 DIAGNOSIS — J45909 Unspecified asthma, uncomplicated: Secondary | ICD-10-CM | POA: Diagnosis not present

## 2015-10-22 DIAGNOSIS — Z7901 Long term (current) use of anticoagulants: Secondary | ICD-10-CM | POA: Diagnosis not present

## 2015-10-22 DIAGNOSIS — J449 Chronic obstructive pulmonary disease, unspecified: Secondary | ICD-10-CM | POA: Diagnosis not present

## 2015-10-23 DIAGNOSIS — R279 Unspecified lack of coordination: Secondary | ICD-10-CM | POA: Diagnosis not present

## 2015-10-23 DIAGNOSIS — I2510 Atherosclerotic heart disease of native coronary artery without angina pectoris: Secondary | ICD-10-CM | POA: Diagnosis not present

## 2015-10-23 DIAGNOSIS — Z9981 Dependence on supplemental oxygen: Secondary | ICD-10-CM | POA: Diagnosis not present

## 2015-10-23 DIAGNOSIS — J449 Chronic obstructive pulmonary disease, unspecified: Secondary | ICD-10-CM | POA: Diagnosis not present

## 2015-10-23 DIAGNOSIS — J45909 Unspecified asthma, uncomplicated: Secondary | ICD-10-CM | POA: Diagnosis not present

## 2015-10-23 DIAGNOSIS — I129 Hypertensive chronic kidney disease with stage 1 through stage 4 chronic kidney disease, or unspecified chronic kidney disease: Secondary | ICD-10-CM | POA: Diagnosis not present

## 2015-10-23 DIAGNOSIS — Z7901 Long term (current) use of anticoagulants: Secondary | ICD-10-CM | POA: Diagnosis not present

## 2015-10-23 DIAGNOSIS — N189 Chronic kidney disease, unspecified: Secondary | ICD-10-CM | POA: Diagnosis not present

## 2015-10-24 DIAGNOSIS — I129 Hypertensive chronic kidney disease with stage 1 through stage 4 chronic kidney disease, or unspecified chronic kidney disease: Secondary | ICD-10-CM | POA: Diagnosis not present

## 2015-10-24 DIAGNOSIS — J449 Chronic obstructive pulmonary disease, unspecified: Secondary | ICD-10-CM | POA: Diagnosis not present

## 2015-10-24 DIAGNOSIS — Z9981 Dependence on supplemental oxygen: Secondary | ICD-10-CM | POA: Diagnosis not present

## 2015-10-24 DIAGNOSIS — I2510 Atherosclerotic heart disease of native coronary artery without angina pectoris: Secondary | ICD-10-CM | POA: Diagnosis not present

## 2015-10-24 DIAGNOSIS — N189 Chronic kidney disease, unspecified: Secondary | ICD-10-CM | POA: Diagnosis not present

## 2015-10-24 DIAGNOSIS — J45909 Unspecified asthma, uncomplicated: Secondary | ICD-10-CM | POA: Diagnosis not present

## 2015-10-24 DIAGNOSIS — Z7901 Long term (current) use of anticoagulants: Secondary | ICD-10-CM | POA: Diagnosis not present

## 2015-10-24 DIAGNOSIS — R279 Unspecified lack of coordination: Secondary | ICD-10-CM | POA: Diagnosis not present

## 2015-10-28 DIAGNOSIS — N189 Chronic kidney disease, unspecified: Secondary | ICD-10-CM | POA: Diagnosis not present

## 2015-10-28 DIAGNOSIS — Z9981 Dependence on supplemental oxygen: Secondary | ICD-10-CM | POA: Diagnosis not present

## 2015-10-28 DIAGNOSIS — I2510 Atherosclerotic heart disease of native coronary artery without angina pectoris: Secondary | ICD-10-CM | POA: Diagnosis not present

## 2015-10-28 DIAGNOSIS — J45909 Unspecified asthma, uncomplicated: Secondary | ICD-10-CM | POA: Diagnosis not present

## 2015-10-28 DIAGNOSIS — I129 Hypertensive chronic kidney disease with stage 1 through stage 4 chronic kidney disease, or unspecified chronic kidney disease: Secondary | ICD-10-CM | POA: Diagnosis not present

## 2015-10-28 DIAGNOSIS — Z7901 Long term (current) use of anticoagulants: Secondary | ICD-10-CM | POA: Diagnosis not present

## 2015-10-28 DIAGNOSIS — J449 Chronic obstructive pulmonary disease, unspecified: Secondary | ICD-10-CM | POA: Diagnosis not present

## 2015-10-28 DIAGNOSIS — R279 Unspecified lack of coordination: Secondary | ICD-10-CM | POA: Diagnosis not present

## 2015-10-29 DIAGNOSIS — I129 Hypertensive chronic kidney disease with stage 1 through stage 4 chronic kidney disease, or unspecified chronic kidney disease: Secondary | ICD-10-CM | POA: Diagnosis not present

## 2015-10-29 DIAGNOSIS — Z7901 Long term (current) use of anticoagulants: Secondary | ICD-10-CM | POA: Diagnosis not present

## 2015-10-29 DIAGNOSIS — N189 Chronic kidney disease, unspecified: Secondary | ICD-10-CM | POA: Diagnosis not present

## 2015-10-29 DIAGNOSIS — I2510 Atherosclerotic heart disease of native coronary artery without angina pectoris: Secondary | ICD-10-CM | POA: Diagnosis not present

## 2015-10-29 DIAGNOSIS — Z9981 Dependence on supplemental oxygen: Secondary | ICD-10-CM | POA: Diagnosis not present

## 2015-10-29 DIAGNOSIS — J449 Chronic obstructive pulmonary disease, unspecified: Secondary | ICD-10-CM | POA: Diagnosis not present

## 2015-10-29 DIAGNOSIS — R279 Unspecified lack of coordination: Secondary | ICD-10-CM | POA: Diagnosis not present

## 2015-10-29 DIAGNOSIS — J45909 Unspecified asthma, uncomplicated: Secondary | ICD-10-CM | POA: Diagnosis not present

## 2015-10-30 DIAGNOSIS — Z9981 Dependence on supplemental oxygen: Secondary | ICD-10-CM | POA: Diagnosis not present

## 2015-10-30 DIAGNOSIS — R279 Unspecified lack of coordination: Secondary | ICD-10-CM | POA: Diagnosis not present

## 2015-10-30 DIAGNOSIS — N189 Chronic kidney disease, unspecified: Secondary | ICD-10-CM | POA: Diagnosis not present

## 2015-10-30 DIAGNOSIS — I129 Hypertensive chronic kidney disease with stage 1 through stage 4 chronic kidney disease, or unspecified chronic kidney disease: Secondary | ICD-10-CM | POA: Diagnosis not present

## 2015-10-30 DIAGNOSIS — Z7901 Long term (current) use of anticoagulants: Secondary | ICD-10-CM | POA: Diagnosis not present

## 2015-10-30 DIAGNOSIS — J449 Chronic obstructive pulmonary disease, unspecified: Secondary | ICD-10-CM | POA: Diagnosis not present

## 2015-10-30 DIAGNOSIS — J45909 Unspecified asthma, uncomplicated: Secondary | ICD-10-CM | POA: Diagnosis not present

## 2015-10-30 DIAGNOSIS — I2510 Atherosclerotic heart disease of native coronary artery without angina pectoris: Secondary | ICD-10-CM | POA: Diagnosis not present

## 2015-11-03 ENCOUNTER — Other Ambulatory Visit: Payer: Self-pay | Admitting: Family Medicine

## 2015-11-03 DIAGNOSIS — M4856XA Collapsed vertebra, not elsewhere classified, lumbar region, initial encounter for fracture: Secondary | ICD-10-CM | POA: Diagnosis not present

## 2015-11-03 DIAGNOSIS — E2839 Other primary ovarian failure: Secondary | ICD-10-CM | POA: Diagnosis not present

## 2015-11-03 DIAGNOSIS — Z6823 Body mass index (BMI) 23.0-23.9, adult: Secondary | ICD-10-CM | POA: Diagnosis not present

## 2015-11-03 DIAGNOSIS — Z1231 Encounter for screening mammogram for malignant neoplasm of breast: Secondary | ICD-10-CM

## 2015-11-05 DIAGNOSIS — R279 Unspecified lack of coordination: Secondary | ICD-10-CM | POA: Diagnosis not present

## 2015-11-05 DIAGNOSIS — I129 Hypertensive chronic kidney disease with stage 1 through stage 4 chronic kidney disease, or unspecified chronic kidney disease: Secondary | ICD-10-CM | POA: Diagnosis not present

## 2015-11-05 DIAGNOSIS — Z7901 Long term (current) use of anticoagulants: Secondary | ICD-10-CM | POA: Diagnosis not present

## 2015-11-05 DIAGNOSIS — J45909 Unspecified asthma, uncomplicated: Secondary | ICD-10-CM | POA: Diagnosis not present

## 2015-11-05 DIAGNOSIS — J449 Chronic obstructive pulmonary disease, unspecified: Secondary | ICD-10-CM | POA: Diagnosis not present

## 2015-11-05 DIAGNOSIS — N189 Chronic kidney disease, unspecified: Secondary | ICD-10-CM | POA: Diagnosis not present

## 2015-11-05 DIAGNOSIS — I2510 Atherosclerotic heart disease of native coronary artery without angina pectoris: Secondary | ICD-10-CM | POA: Diagnosis not present

## 2015-11-05 DIAGNOSIS — Z9981 Dependence on supplemental oxygen: Secondary | ICD-10-CM | POA: Diagnosis not present

## 2015-11-07 DIAGNOSIS — I129 Hypertensive chronic kidney disease with stage 1 through stage 4 chronic kidney disease, or unspecified chronic kidney disease: Secondary | ICD-10-CM | POA: Diagnosis not present

## 2015-11-07 DIAGNOSIS — Z9981 Dependence on supplemental oxygen: Secondary | ICD-10-CM | POA: Diagnosis not present

## 2015-11-07 DIAGNOSIS — J449 Chronic obstructive pulmonary disease, unspecified: Secondary | ICD-10-CM | POA: Diagnosis not present

## 2015-11-07 DIAGNOSIS — Z7901 Long term (current) use of anticoagulants: Secondary | ICD-10-CM | POA: Diagnosis not present

## 2015-11-07 DIAGNOSIS — N189 Chronic kidney disease, unspecified: Secondary | ICD-10-CM | POA: Diagnosis not present

## 2015-11-07 DIAGNOSIS — J45909 Unspecified asthma, uncomplicated: Secondary | ICD-10-CM | POA: Diagnosis not present

## 2015-11-07 DIAGNOSIS — R32 Unspecified urinary incontinence: Secondary | ICD-10-CM | POA: Diagnosis not present

## 2015-11-07 DIAGNOSIS — I2510 Atherosclerotic heart disease of native coronary artery without angina pectoris: Secondary | ICD-10-CM | POA: Diagnosis not present

## 2015-11-07 DIAGNOSIS — R279 Unspecified lack of coordination: Secondary | ICD-10-CM | POA: Diagnosis not present

## 2015-11-10 DIAGNOSIS — R32 Unspecified urinary incontinence: Secondary | ICD-10-CM | POA: Diagnosis not present

## 2015-11-11 DIAGNOSIS — R32 Unspecified urinary incontinence: Secondary | ICD-10-CM | POA: Diagnosis not present

## 2015-11-12 DIAGNOSIS — R32 Unspecified urinary incontinence: Secondary | ICD-10-CM | POA: Diagnosis not present

## 2015-11-13 DIAGNOSIS — R32 Unspecified urinary incontinence: Secondary | ICD-10-CM | POA: Diagnosis not present

## 2015-11-14 DIAGNOSIS — R32 Unspecified urinary incontinence: Secondary | ICD-10-CM | POA: Diagnosis not present

## 2015-11-17 DIAGNOSIS — R32 Unspecified urinary incontinence: Secondary | ICD-10-CM | POA: Diagnosis not present

## 2015-11-18 DIAGNOSIS — R32 Unspecified urinary incontinence: Secondary | ICD-10-CM | POA: Diagnosis not present

## 2015-11-19 DIAGNOSIS — R32 Unspecified urinary incontinence: Secondary | ICD-10-CM | POA: Diagnosis not present

## 2015-11-19 IMAGING — CR DG CHEST 2V
1 series · 1 of 1 positions shown · non-contrast
Comparison: CT ANGIO CHEST W/CM &/OR WO/CM dated 02/01/2012; DG
CHEST 2 VIEW dated 01/31/2012; DG CHEST 1V PORT dated 01/29/2012

CLINICAL DATA: Shortness of breath and chest pain with history of
COPD and pneumonia and previous MI

EXAM:
CHEST  2 VIEW

[w chest lat *]
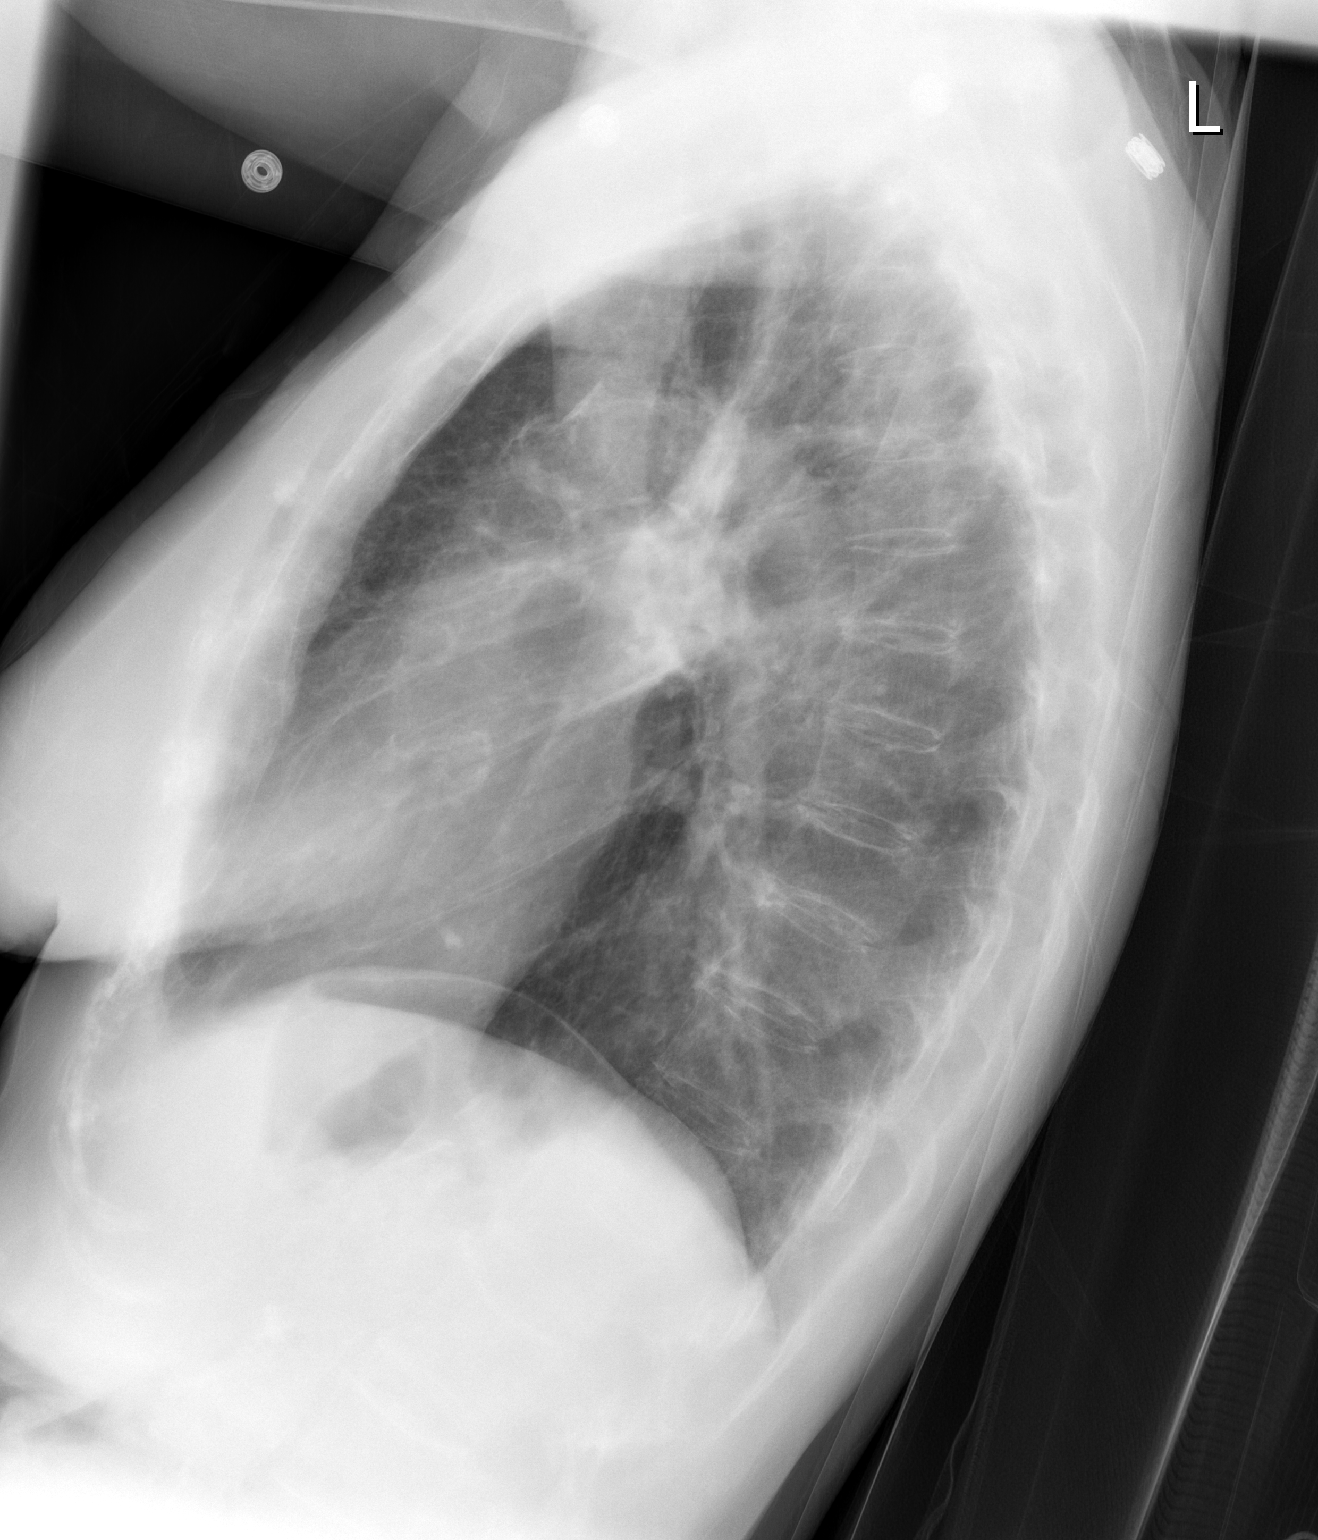

[1 of 1 positions shown; findings below may reference images not displayed]

FINDINGS: The lungs are hyperinflated. There is no focal infiltrate. There is
apical scarring bilaterally most notably on the right. This is not
new. There is mild retraction of the hilar structures superiorly.
The cardiopericardial silhouette is normal in size. The pulmonary
vascularity is not engorged. The mediastinum is normal in width.
There is mild dextro correction levoscoliosis of the mid thoracic
spine. There is no pleural effusion or pneumothorax. The observed
portions of the bony thorax appear normal.
IMPRESSION: There is hyperinflation consistent with COPD. There is no focal
pneumonia nor evidence of CHF. Persistently increased density in the
right upper lobe superiorly is demonstrated but not greatly changed
from previous studies. If the patient's symptoms persist or remain
unexplained, follow-up chest CT scanning may be useful.

## 2015-11-20 ENCOUNTER — Ambulatory Visit
Admission: RE | Admit: 2015-11-20 | Discharge: 2015-11-20 | Disposition: A | Discharge status: Home / Self Care | Payer: Commercial Managed Care - HMO | Source: Ambulatory Visit | Attending: Family Medicine | Admitting: Family Medicine

## 2015-11-20 DIAGNOSIS — E2839 Other primary ovarian failure: Secondary | ICD-10-CM

## 2015-11-20 DIAGNOSIS — R32 Unspecified urinary incontinence: Secondary | ICD-10-CM | POA: Diagnosis not present

## 2015-11-20 DIAGNOSIS — Z780 Asymptomatic menopausal state: Secondary | ICD-10-CM | POA: Diagnosis not present

## 2015-11-20 DIAGNOSIS — M810 Age-related osteoporosis without current pathological fracture: Secondary | ICD-10-CM | POA: Diagnosis not present

## 2015-11-20 DIAGNOSIS — Z1231 Encounter for screening mammogram for malignant neoplasm of breast: Secondary | ICD-10-CM

## 2015-11-21 DIAGNOSIS — Z9981 Dependence on supplemental oxygen: Secondary | ICD-10-CM | POA: Diagnosis not present

## 2015-11-21 DIAGNOSIS — I129 Hypertensive chronic kidney disease with stage 1 through stage 4 chronic kidney disease, or unspecified chronic kidney disease: Secondary | ICD-10-CM | POA: Diagnosis not present

## 2015-11-21 DIAGNOSIS — Z7901 Long term (current) use of anticoagulants: Secondary | ICD-10-CM | POA: Diagnosis not present

## 2015-11-21 DIAGNOSIS — J45909 Unspecified asthma, uncomplicated: Secondary | ICD-10-CM | POA: Diagnosis not present

## 2015-11-21 DIAGNOSIS — R32 Unspecified urinary incontinence: Secondary | ICD-10-CM | POA: Diagnosis not present

## 2015-11-21 DIAGNOSIS — N189 Chronic kidney disease, unspecified: Secondary | ICD-10-CM | POA: Diagnosis not present

## 2015-11-21 DIAGNOSIS — J449 Chronic obstructive pulmonary disease, unspecified: Secondary | ICD-10-CM | POA: Diagnosis not present

## 2015-11-21 DIAGNOSIS — S32000S Wedge compression fracture of unspecified lumbar vertebra, sequela: Secondary | ICD-10-CM | POA: Diagnosis not present

## 2015-11-21 DIAGNOSIS — I2510 Atherosclerotic heart disease of native coronary artery without angina pectoris: Secondary | ICD-10-CM | POA: Diagnosis not present

## 2015-11-24 ENCOUNTER — Encounter (HOSPITAL_COMMUNITY): Payer: Self-pay

## 2015-11-24 ENCOUNTER — Emergency Department (HOSPITAL_COMMUNITY)
Admission: EM | Admit: 2015-11-24 | Discharge: 2015-11-24 | Disposition: A | Discharge status: Home / Self Care | Payer: Commercial Managed Care - HMO | Attending: Emergency Medicine | Admitting: Emergency Medicine

## 2015-11-24 DIAGNOSIS — Z96611 Presence of right artificial shoulder joint: Secondary | ICD-10-CM | POA: Insufficient documentation

## 2015-11-24 DIAGNOSIS — Z79899 Other long term (current) drug therapy: Secondary | ICD-10-CM | POA: Diagnosis not present

## 2015-11-24 DIAGNOSIS — B999 Unspecified infectious disease: Secondary | ICD-10-CM | POA: Diagnosis not present

## 2015-11-24 DIAGNOSIS — M545 Low back pain, unspecified: Secondary | ICD-10-CM

## 2015-11-24 DIAGNOSIS — Z96641 Presence of right artificial hip joint: Secondary | ICD-10-CM | POA: Insufficient documentation

## 2015-11-24 DIAGNOSIS — N390 Urinary tract infection, site not specified: Secondary | ICD-10-CM | POA: Insufficient documentation

## 2015-11-24 DIAGNOSIS — Z7901 Long term (current) use of anticoagulants: Secondary | ICD-10-CM | POA: Diagnosis not present

## 2015-11-24 DIAGNOSIS — Z7982 Long term (current) use of aspirin: Secondary | ICD-10-CM | POA: Diagnosis not present

## 2015-11-24 DIAGNOSIS — Z87891 Personal history of nicotine dependence: Secondary | ICD-10-CM | POA: Diagnosis not present

## 2015-11-24 DIAGNOSIS — R32 Unspecified urinary incontinence: Secondary | ICD-10-CM | POA: Diagnosis not present

## 2015-11-24 DIAGNOSIS — J449 Chronic obstructive pulmonary disease, unspecified: Secondary | ICD-10-CM | POA: Diagnosis not present

## 2015-11-24 DIAGNOSIS — I129 Hypertensive chronic kidney disease with stage 1 through stage 4 chronic kidney disease, or unspecified chronic kidney disease: Secondary | ICD-10-CM | POA: Insufficient documentation

## 2015-11-24 DIAGNOSIS — N183 Chronic kidney disease, stage 3 (moderate): Secondary | ICD-10-CM | POA: Diagnosis not present

## 2015-11-24 DIAGNOSIS — I2510 Atherosclerotic heart disease of native coronary artery without angina pectoris: Secondary | ICD-10-CM | POA: Insufficient documentation

## 2015-11-24 DIAGNOSIS — R319 Hematuria, unspecified: Secondary | ICD-10-CM | POA: Diagnosis not present

## 2015-11-24 LAB — CBC
HEMATOCRIT: 41.5 % (ref 36.0–46.0)
HEMOGLOBIN: 12.3 g/dL (ref 12.0–15.0)
MCH: 26.6 pg (ref 26.0–34.0)
MCHC: 29.6 g/dL — AB (ref 30.0–36.0)
MCV: 89.6 fL (ref 78.0–100.0)
Platelets: 116 10*3/uL — ABNORMAL LOW (ref 150–400)
RBC: 4.63 MIL/uL (ref 3.87–5.11)
RDW: 15.2 % (ref 11.5–15.5)
WBC: 5.3 10*3/uL (ref 4.0–10.5)

## 2015-11-24 LAB — I-STAT CHEM 8, ED
BUN: 12 mg/dL (ref 6–20)
CHLORIDE: 97 mmol/L — AB (ref 101–111)
CREATININE: 0.7 mg/dL (ref 0.44–1.00)
Calcium, Ion: 1.13 mmol/L — ABNORMAL LOW (ref 1.15–1.40)
Glucose, Bld: 91 mg/dL (ref 65–99)
HEMATOCRIT: 42 % (ref 36.0–46.0)
Hemoglobin: 14.3 g/dL (ref 12.0–15.0)
POTASSIUM: 3.8 mmol/L (ref 3.5–5.1)
Sodium: 141 mmol/L (ref 135–145)
TCO2: 32 mmol/L (ref 0–100)

## 2015-11-24 LAB — URINE MICROSCOPIC-ADD ON

## 2015-11-24 LAB — URINALYSIS, ROUTINE W REFLEX MICROSCOPIC
BILIRUBIN URINE: NEGATIVE
GLUCOSE, UA: NEGATIVE mg/dL
KETONES UR: 15 mg/dL — AB
NITRITE: POSITIVE — AB
PROTEIN: 100 mg/dL — AB
Specific Gravity, Urine: 1.016 (ref 1.005–1.030)
pH: 7.5 (ref 5.0–8.0)

## 2015-11-24 MED ORDER — CEPHALEXIN 500 MG PO CAPS: 500.0000 mg | ORAL_CAPSULE | Freq: Two times a day (BID) | ORAL | 0 refills | Status: DC

## 2015-11-24 MED ORDER — CEFTRIAXONE SODIUM 1 G IJ SOLR
1.0000 g | Freq: Once | INTRAMUSCULAR | Status: AC
  Administered 2015-11-24: 1 g via INTRAVENOUS
  Filled 2015-11-24: qty 10

## 2015-11-24 MED ORDER — OXYCODONE-ACETAMINOPHEN 5-325 MG PO TABS
2.0000 | ORAL_TABLET | Freq: Once | ORAL | Status: AC
  Administered 2015-11-24: 2 via ORAL
  Filled 2015-11-24: qty 2

## 2015-11-24 NOTE — Discharge Instructions (Signed)
You should be contacted about physical therapy coming to your home to assist with the pain from your broken bone in your back. Take Keflex as prescribed for urinary tract infection; do not stop your antibiotics early. Follow up with your primary care doctor to ensure resolution of symptoms. Return for any new or concerning symptoms.

## 2015-11-24 NOTE — ED Provider Notes (Signed)
[facility] DEPT Provider Note   CSN: [id number] Arrival date & time: 11/24/15  1458     History   Chief Complaint Chief Complaint  Patient presents with  . Hematuria  . Back Pain    HPI [name] [name] is a 75 y.o. female.  75 year old female with a history of hypertension, COPD on chronic 3 L oxygen at home, dyslipidemia, and PE on Xarelto presents to the emergency department for 2 complaints. Patient states that she began to notice blood in her urine last night. She states that she has not been passing any clots. She does report frequency and urinary urgency. She complains of some burning dysuria as well. Symptoms further associated with some urgency incontinence. She reports having some vomiting yesterday preventing her from "keeping anything down". She denies any vomiting or nausea today. No medications taken prior to arrival for symptoms. She denies any associated fevers.  Patient also complaining of back pain. She states that she was told that she had a spinal fracture and it "hurts real bad". She states that she was previously on "pain pills" for this, but these medications were discontinued by her primary care doctor 1 week ago "because she said I could get addicted". Patient denies any bowel incontinence. No urinary incontinence WITHOUT sensation to void. No recent trauma or falls. Pain is constant in her low back and nonradiating.   The history is provided by the patient. No language interpreter was used.  Hematuria   Back Pain      Past Medical History:  Diagnosis Date  . Anemia   . Arthritis    "back" (11/21/2014)  . Asthma   . Blood dyscrasia    hx blood clotts  . Chronic bronchitis ([place])   . Chronic kidney disease   . Chronic lower back pain   . 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 GERD (gastroesophageal reflux disease)   . [id number])    "a few times/year" (11/21/2014)  . History of blood transfusion X 2   "related to  blood thinner I was taking"  . Hyperlipidemia   . Hypertension   . Migraine    "weekly probably" (11/21/2014)  . On home oxygen therapy    "3L; 24/7" (11/21/2014)  . Pneumonia    hx pneumonia   . Pneumothorax on right 09/2014  . Pulmonary embolism ([place])   . Shortness of breath   . Urinary incontinence     Patient Active Problem List   Diagnosis Date Noted  . Pneumothorax, right 11/20/2014  . Pneumothorax   . Recurrent spontaneous pneumothorax   . Chest tube in place   . Pneumothorax on right 10/09/2014  . Pneumothorax on left 09/14/2014  . Gross hematuria   . Hematuria 06/10/2014  . Cystitis, acute hemorrhagic 06/08/2014  . Hematochezia 06/08/2014  . Acute pyelonephritis 04/28/2014  . COLD (chronic obstructive lung disease) ([place])   . Essential hypertension   . CAP (community acquired pneumonia) 04/27/2014  . SOB (shortness of breath) 04/27/2014  . Microcytic anemia 04/27/2014  . Hemorrhagic cystitis 08/03/2013  . Anemia due to acute blood loss 08/03/2013  . COPD exacerbation ([place]) 07/08/2013  . COPD with acute exacerbation ([place]) 07/08/2013  . Paroxysmal atrial fibrillation ([place]) 02/03/2012  . UTI (lower urinary tract infection) 02/02/2012  . Thrombocytopenia ([place]) 02/02/2012  . Fracture of right hip ([place]) 01/29/2012  . Multiple rib fractures 01/29/2012  . Fall due to stumbling 01/29/2012  . CAD (coronary artery disease)  01/29/2012  . Hypertension 01/29/2012  . COPD (chronic obstructive pulmonary disease) ([place]) 01/29/2012  . CKD (chronic kidney disease) 01/29/2012  . Osteoarthritis 01/29/2012  . Closed fracture of right distal radius 10/13/2011    Class: Acute    Past Surgical History:  Procedure Laterality Date  . ABDOMINAL HYSTERECTOMY    . CATARACT EXTRACTION W/ INTRAOCULAR LENS  IMPLANT, BILATERAL Bilateral   . CYSTOSCOPY W/ RETROGRADES Bilateral 06/11/2014   Procedure: CYSTOSCOPY WITH RETROGRADE PYELOGRAM;  Surgeon: [name], MD;  Location: [facility];   Service: Urology;  Laterality: Bilateral;  . FRACTURE SURGERY    . HARDWARE REMOVAL  01/29/2012   Procedure: HARDWARE REMOVAL;  Surgeon: [name], MD;  Location: [facility];  Service: Orthopedics;  Laterality: Right;  . HEMIARTHROPLASTY SHOULDER FRACTURE Right   . HIP ARTHROPLASTY  01/29/2012   Procedure: ARTHROPLASTY BIPOLAR HIP;  Surgeon: [name], MD;  Location: [facility];  Service: Orthopedics;  Laterality: Right;  . LAPAROSCOPIC CHOLECYSTECTOMY    . ORIF WRIST FRACTURE  10/13/2011   Procedure: OPEN REDUCTION INTERNAL FIXATION (ORIF) WRIST FRACTURE;  Surgeon: [name], MD;  Location: [facility];  Service: Orthopedics;  Laterality: Right;  Open Reduction Internal Fixation Right Distal Radius   . TRANSURETHRAL RESECTION OF BLADDER TUMOR N/A 06/11/2014   Procedure: TRANSURETHRAL RESECTION OF BLADDER TUMOR (TURBT);  Surgeon: [name], MD;  Location: [facility];  Service: Urology;  Laterality: N/A;  . TUBAL LIGATION      OB History    No data available       Home Medications    Prior to Admission medications   Medication Sig Start Date End Date Taking? Authorizing Provider  albuterol (PROVENTIL) (2.5 MG/3ML) 0.083% nebulizer solution Take 2.5 mg by nebulization every 6 (six) hours as needed for wheezing or shortness of breath.     Historical Provider, MD  aspirin-sod bicarb-citric acid (ALKA-SELTZER) 325 MG TBEF tablet Take 325 mg by mouth every 6 (six) hours as needed (for stomach).    Historical Provider, MD  atorvastatin (LIPITOR) 40 MG tablet Take 40 mg by mouth every morning.  07/02/13   Historical Provider, MD  cephALEXin (KEFLEX) 500 MG capsule Take 1 capsule (500 mg total) by mouth 2 (two) times daily. 11/24/15   Antonietta Breach, PA-C  Cranberry-Vitamin C-Probiotic (AZO CRANBERRY PO) Take 2 capsules by mouth daily.    Historical Provider, MD  cyanocobalamin 500 MCG tablet Take 500 mcg by mouth daily.    Historical Provider, MD  diltiazem (CARDIZEM CD) 240 MG 24 hr capsule Take 240 mg  by mouth every morning.  04/08/14   Historical Provider, MD  docusate sodium (COLACE) 100 MG capsule Take 1 capsule (100 mg total) by mouth every 12 (twelve) hours. 10/21/15   Tanna Furry, MD  fluticasone (FLONASE) 50 MCG/ACT nasal spray Place 1 spray into both nostrils daily as needed for allergies or rhinitis. 08/06/13   Barton Dubois, MD  furosemide (LASIX) 20 MG tablet Take 1 tablet (20 mg total) by mouth daily. Patient taking differently: Take 20 mg by mouth daily as needed for fluid or edema.  06/13/14   Theodis Blaze, MD  HYDROcodone-acetaminophen (NORCO/VICODIN) 5-325 MG tablet Take 1 tablet by mouth every 4 (four) hours as needed. 10/21/15   Tanna Furry, MD  metoprolol succinate (TOPROL-XL) 25 MG 24 hr tablet Take 12.5 mg by mouth every morning. 07/21/15   Historical Provider, MD  Omega-3 Fatty Acids (FISH OIL) 1000 MG CAPS Take 1,000 mg by mouth  daily.     Historical Provider, MD  oxybutynin (DITROPAN-XL) 5 MG 24 hr tablet Take 10 mg by mouth at bedtime.    Historical Provider, MD  pantoprazole (PROTONIX) 40 MG tablet Take 1 tablet (40 mg total) by mouth daily. [id number]   Delora Fuel, MD  predniSONE (DELTASONE) 20 MG tablet Take 2 tablets (40 mg total) by mouth daily with breakfast. Patient not taking: Reported on 10/21/2015 09/25/15   Milagros Loll, MD  rivaroxaban (XARELTO) 20 MG TABS tablet Take 20 mg by mouth daily with supper.    Historical Provider, MD  sertraline (ZOLOFT) 25 MG tablet Take 1 tablet (25 mg total) by mouth at bedtime. 09/24/15   Carly Montey Hora, MD  sulfamethoxazole-trimethoprim (BACTRIM DS,SEPTRA DS) 800-160 MG tablet Take 1 tablet by mouth every 12 (twelve) hours. Patient not taking: Reported on 10/21/2015 09/24/15   Juliet Rude, MD  tiotropium (SPIRIVA) 18 MCG inhalation capsule Place 1 capsule (18 mcg total) into inhaler and inhale daily. 08/06/13   Barton Dubois, MD  tiZANidine (ZANAFLEX) 2 MG tablet Take 2 mg by mouth at bedtime.    Historical Provider, MD  VENTOLIN HFA 108 (90  BASE) MCG/ACT inhaler Inhale 2 puffs into the lungs 4 (four) times daily as needed for wheezing or shortness of breath.  06/07/14   Historical Provider, MD    Family History Family History  Problem Relation Age of Onset  . Cancer - Lung Mother   . Coronary artery disease Mother   . Coronary artery disease Sister   . Coronary artery disease Brother     Social History Social History  Substance Use Topics  . Smoking status: Former Smoker    Packs/day: 1.00    Years: 56.00    Types: Cigarettes    Quit date: 02/12/2014  . Smokeless tobacco: Never Used  . Alcohol use No     Allergies   Eggs or egg-derived products   Review of Systems Review of Systems  Genitourinary: Positive for hematuria.  Musculoskeletal: Positive for back pain.  Ten systems reviewed and are negative for acute change, except as noted in the HPI.    Physical Exam Updated Vital Signs BP 155/67   Pulse 74   Temp 97.7 F (36.5 C) (Oral)   Resp 18   Ht 5\' 1"  (1.549 m)   Wt 57.6 kg   SpO2 99%   BMI 24.00 kg/m   Physical Exam  Constitutional: She is oriented to person, place, and time. She appears well-developed and well-nourished. No distress.  Nontoxic-appearing and pleasant.  HENT:  Head: Normocephalic and atraumatic.  Eyes: Conjunctivae and EOM are normal. No scleral icterus.  Neck: Normal range of motion.  Cardiovascular: Normal rate, regular rhythm and intact distal pulses.   Pulmonary/Chest: Effort normal. No respiratory distress. She has no wheezes.  Oxygen saturations 100% on 3 L. Prolonged expiratory phase. Decreased breath sounds diffusely without wheezes or rales. Chest expansion symmetric.  Abdominal: Soft. She exhibits no distension. There is tenderness. There is no guarding.  Mild suprapubic tenderness to palpation. No masses or peritoneal signs. No guarding.  Musculoskeletal: Normal range of motion.  Tenderness to palpation to lumbar midline at approximately L2/3. No bony deformities,  step-offs, or crepitus.  Neurological: She is alert and oriented to person, place, and time. She exhibits normal muscle tone. Coordination normal.  GCS 15. Patient moving all extremities.  Skin: Skin is warm and dry. No rash noted. She is not diaphoretic. No erythema. No pallor.  Psychiatric: She has a normal mood and affect. Her behavior is normal.  Nursing note and vitals reviewed.    ED Treatments / Results  Labs (all labs ordered are listed, but only abnormal results are displayed) Labs Reviewed  URINALYSIS, ROUTINE W REFLEX MICROSCOPIC (NOT AT [facility]) - Abnormal; Notable for the following:       Result Value   Color, Urine ORANGE (*)    APPearance TURBID (*)    Hgb urine dipstick LARGE (*)    Ketones, ur 15 (*)    Protein, ur 100 (*)    Nitrite POSITIVE (*)    Leukocytes, UA LARGE (*)    All other components within normal limits  URINE MICROSCOPIC-ADD ON - Abnormal; Notable for the following:    Squamous Epithelial / LPF 0-5 (*)    Bacteria, UA MANY (*)    All other components within normal limits  CBC - Abnormal; Notable for the following:    MCHC 29.6 (*)    Platelets 116 (*)    All other components within normal limits  I-STAT CHEM 8, ED - Abnormal; Notable for the following:    Chloride 97 (*)    Calcium, Ion 1.13 (*)    All other components within normal limits  URINE CULTURE    EKG  EKG Interpretation None       Radiology No results found.  Procedures Procedures (including critical care time)  Medications Ordered in ED Medications  cefTRIAXone (ROCEPHIN) 1 g in dextrose 5 % 50 mL IVPB (0 g Intravenous Stopped 11/24/15 2124)  oxyCODONE-acetaminophen (PERCOCET/ROXICET) 5-325 MG per tablet 2 tablet (2 tablets Oral Given 11/24/15 2108)     Initial Impression / Assessment and Plan / ED Course  I have reviewed the triage vital signs and the nursing notes.  Pertinent labs & imaging results that were available during my care of the patient were reviewed by  me and considered in my medical decision making (see chart for details).  Clinical Course    75 year old female presents to the emergency department for evaluation of hematuria. Urinalysis today reveals findings consistent with hemorrhagic cystitis. Urine sent for culture. Patient given initial dose of Rocephin in the emergency department. Plan to discharge on Keflex. Kidney function is preserved and patient is afebrile, without leukocytosis. I do not believe she warrants inpatient management of this time.  Patient also complaining of back pain. This pain is attributed to her recent findings of L1 fracture. Patient with no red flags or signs concerning for cauda equina. She is moving all extremities. Patient has been evaluated by care management in the emergency department who will help coordinate home PT. Patient's pain was previously managed by her primary care doctor. She reports worsening pain since she was taken off Percocet one week ago. Patient has been referred to her primary care doctor to discuss further options for pain control. Will continue with Tylenol in the interim.  No indication for further emergent workup at time. Patient discharged in stable condition with no unaddressed concerns.   Vitals:   11/24/15 1720 11/24/15 1948 11/24/15 2145 11/24/15 2200  BP: 157/57 144/56 (!) 141/53 155/67  Pulse: 73 87 74 74  Resp: 16 18    Temp: 97.7 F (36.5 C)     TempSrc: Oral     SpO2: 98% 98% 98% 99%  Weight:      Height:        Final Clinical Impressions(s) / ED Diagnoses   Final diagnoses:  UTI (lower  urinary tract infection)  Midline low back pain without sciatica    New Prescriptions New Prescriptions   CEPHALEXIN (KEFLEX) 500 MG CAPSULE    Take 1 capsule (500 mg total) by mouth 2 (two) times daily.     Antonietta Breach, PA-C 11/24/15 2243    Merrily Pew, MD 11/25/15 [id number]    Merrily Pew, MD 11/25/15 [phone]

## 2015-11-24 NOTE — ED Notes (Signed)
Assisted pt to the bathroom, pt updated about wait time.

## 2015-11-24 NOTE — ED Triage Notes (Signed)
Per [facility], Pt is coming from home with complaints of back pain and blood in the urine. Pt reports this starting last night. Denies fevers. VS per EMS; 130/72, 70 HR, 95% on 2 L. Pt wears Chronic Oxygen per EMS.

## 2015-11-24 NOTE — ED Notes (Signed)
Patient Alert and oriented X4. Stable and ambulatory. Patient verbalized understanding of the discharge instructions.  Patient belongings were taken by the patient. Patient taken by [facility] due to having need for continuous O2

## 2015-11-24 NOTE — ED Notes (Signed)
Rounded on patient, assisted her in calling her son. Updated and apologized for wait.

## 2015-11-24 NOTE — ED Provider Notes (Signed)
Medical screening examination/treatment/procedure(s) were conducted as a shared visit with non-physician practitioner(s) and myself.  I personally evaluated the patient during the encounter.  Chronic back pain 2/2 compression fracture. Old. Off narcotics. Needs PT, will reconsult care management.  Also with hematuria, concern for UTI. Some suprapubic ttp. Mild cva ttp. No e/o systemic infection plan for outpatient antibiotics and pcp follow up for test of cure.    Merrily Pew, MD 11/25/15 [phone]

## 2015-11-24 NOTE — Care Management Note (Addendum)
Case Management Note  Patient Details  Name: [name] [name] MRN: [id number] Date of Birth: 09/16/1940  Subjective/Objective:                  . Hematuria . Back Pain    Action/Plan: CM spoke to patient and her daughter in law, [name] at the bedside. Patient said that she lives at home alone and has not had any falls in 4 years. Patient has an aide 5 days a week through [facility] and was previously getting [facility] and would like to use their services again.  Patient wears oxygen at 3L/min via [place] and gets oxygen through [facility]. She has a BSC, nebulizer, Shower Chair, walker, and grabber at home and said that she has no further discharge needs. Cm remains available should additional needs arise.   Expected Discharge Date:  11/24/15             Expected Discharge Plan:  [place]  In-House Referral:     Discharge planning Services  CM Consult  Post Acute Care Choice:  Home Health Choice offered to:  Patient  DME Arranged:    DME Agency:     HH Arranged:  PT HH Agency:  [facility]  Status of Service:  Completed, signed off  If discussed at [place] of Stay Meetings, dates discussed:    Additional Comments:  Guido Sander, RN 11/24/2015, 10:17 PM

## 2015-11-25 DIAGNOSIS — R32 Unspecified urinary incontinence: Secondary | ICD-10-CM | POA: Diagnosis not present

## 2015-11-26 DIAGNOSIS — R32 Unspecified urinary incontinence: Secondary | ICD-10-CM | POA: Diagnosis not present

## 2015-11-27 DIAGNOSIS — R32 Unspecified urinary incontinence: Secondary | ICD-10-CM | POA: Diagnosis not present

## 2015-11-27 LAB — URINE CULTURE: Culture: >=100000 — AB

## 2015-11-28 ENCOUNTER — Telehealth (HOSPITAL_BASED_OUTPATIENT_CLINIC_OR_DEPARTMENT_OTHER): Payer: Self-pay

## 2015-11-28 DIAGNOSIS — R32 Unspecified urinary incontinence: Secondary | ICD-10-CM | POA: Diagnosis not present

## 2015-11-28 NOTE — Telephone Encounter (Signed)
Post ED Visit - Positive Culture Follow-up  Culture report reviewed by antimicrobial stewardship pharmacist:  []  Elenor Quinones, Pharm.D. []  Heide Guile, Pharm.D., BCPS []  [name] Neptune, Pharm.D. []  Alycia Rossetti, Pharm.D., BCPS []  [place], [place].D., BCPS, AAHIVP []  Legrand Como, Pharm.D., BCPS, AAHIVP []  Milus Glazier, Pharm.D. []  Stephens November, Pharm.D. Dimitri Ped Pharm D Positive urine culture Treated with Cephalexin, organism sensitive to the same and no further patient follow-up is required at this time.  [name] 11/28/2015, 10:03 AM

## 2015-12-01 ENCOUNTER — Other Ambulatory Visit: Payer: Self-pay | Admitting: Internal Medicine

## 2015-12-01 DIAGNOSIS — Z9981 Dependence on supplemental oxygen: Secondary | ICD-10-CM | POA: Diagnosis not present

## 2015-12-01 DIAGNOSIS — J45909 Unspecified asthma, uncomplicated: Secondary | ICD-10-CM | POA: Diagnosis not present

## 2015-12-01 DIAGNOSIS — S32000S Wedge compression fracture of unspecified lumbar vertebra, sequela: Secondary | ICD-10-CM | POA: Diagnosis not present

## 2015-12-01 DIAGNOSIS — R32 Unspecified urinary incontinence: Secondary | ICD-10-CM | POA: Diagnosis not present

## 2015-12-01 DIAGNOSIS — I2510 Atherosclerotic heart disease of native coronary artery without angina pectoris: Secondary | ICD-10-CM | POA: Diagnosis not present

## 2015-12-01 DIAGNOSIS — N189 Chronic kidney disease, unspecified: Secondary | ICD-10-CM | POA: Diagnosis not present

## 2015-12-01 DIAGNOSIS — I129 Hypertensive chronic kidney disease with stage 1 through stage 4 chronic kidney disease, or unspecified chronic kidney disease: Secondary | ICD-10-CM | POA: Diagnosis not present

## 2015-12-01 DIAGNOSIS — J449 Chronic obstructive pulmonary disease, unspecified: Secondary | ICD-10-CM | POA: Diagnosis not present

## 2015-12-01 DIAGNOSIS — Z7901 Long term (current) use of anticoagulants: Secondary | ICD-10-CM | POA: Diagnosis not present

## 2015-12-02 DIAGNOSIS — R32 Unspecified urinary incontinence: Secondary | ICD-10-CM | POA: Diagnosis not present

## 2015-12-03 DIAGNOSIS — R32 Unspecified urinary incontinence: Secondary | ICD-10-CM | POA: Diagnosis not present

## 2015-12-04 DIAGNOSIS — R32 Unspecified urinary incontinence: Secondary | ICD-10-CM | POA: Diagnosis not present

## 2015-12-05 DIAGNOSIS — R32 Unspecified urinary incontinence: Secondary | ICD-10-CM | POA: Diagnosis not present

## 2015-12-06 ENCOUNTER — Telehealth (HOSPITAL_BASED_OUTPATIENT_CLINIC_OR_DEPARTMENT_OTHER): Payer: Self-pay

## 2015-12-08 DIAGNOSIS — Z23 Encounter for immunization: Secondary | ICD-10-CM | POA: Diagnosis not present

## 2015-12-08 DIAGNOSIS — R609 Edema, unspecified: Secondary | ICD-10-CM | POA: Diagnosis not present

## 2015-12-08 DIAGNOSIS — N390 Urinary tract infection, site not specified: Secondary | ICD-10-CM | POA: Diagnosis not present

## 2015-12-08 DIAGNOSIS — R32 Unspecified urinary incontinence: Secondary | ICD-10-CM | POA: Diagnosis not present

## 2015-12-08 DIAGNOSIS — Z6824 Body mass index (BMI) 24.0-24.9, adult: Secondary | ICD-10-CM | POA: Diagnosis not present

## 2015-12-09 DIAGNOSIS — N189 Chronic kidney disease, unspecified: Secondary | ICD-10-CM | POA: Diagnosis not present

## 2015-12-09 DIAGNOSIS — R32 Unspecified urinary incontinence: Secondary | ICD-10-CM | POA: Diagnosis not present

## 2015-12-09 DIAGNOSIS — I129 Hypertensive chronic kidney disease with stage 1 through stage 4 chronic kidney disease, or unspecified chronic kidney disease: Secondary | ICD-10-CM | POA: Diagnosis not present

## 2015-12-09 DIAGNOSIS — S32000S Wedge compression fracture of unspecified lumbar vertebra, sequela: Secondary | ICD-10-CM | POA: Diagnosis not present

## 2015-12-09 DIAGNOSIS — J45909 Unspecified asthma, uncomplicated: Secondary | ICD-10-CM | POA: Diagnosis not present

## 2015-12-09 DIAGNOSIS — J449 Chronic obstructive pulmonary disease, unspecified: Secondary | ICD-10-CM | POA: Diagnosis not present

## 2015-12-09 DIAGNOSIS — I2510 Atherosclerotic heart disease of native coronary artery without angina pectoris: Secondary | ICD-10-CM | POA: Diagnosis not present

## 2015-12-09 DIAGNOSIS — Z7901 Long term (current) use of anticoagulants: Secondary | ICD-10-CM | POA: Diagnosis not present

## 2015-12-09 DIAGNOSIS — Z9981 Dependence on supplemental oxygen: Secondary | ICD-10-CM | POA: Diagnosis not present

## 2015-12-10 ENCOUNTER — Inpatient Hospital Stay (HOSPITAL_COMMUNITY)
Admission: EM | Admit: 2015-12-10 | Discharge: 2015-12-16 | DRG: 189 | Disposition: A | Discharge status: Skilled Nursing Facility | Payer: Commercial Managed Care - HMO | Attending: Internal Medicine | Admitting: Internal Medicine

## 2015-12-10 ENCOUNTER — Emergency Department (HOSPITAL_COMMUNITY): Payer: Commercial Managed Care - HMO

## 2015-12-10 ENCOUNTER — Encounter (HOSPITAL_COMMUNITY): Payer: Self-pay

## 2015-12-10 DIAGNOSIS — Z91012 Allergy to eggs: Secondary | ICD-10-CM

## 2015-12-10 DIAGNOSIS — Z7901 Long term (current) use of anticoagulants: Secondary | ICD-10-CM

## 2015-12-10 DIAGNOSIS — I5032 Chronic diastolic (congestive) heart failure: Secondary | ICD-10-CM | POA: Diagnosis present

## 2015-12-10 DIAGNOSIS — M1990 Unspecified osteoarthritis, unspecified site: Secondary | ICD-10-CM | POA: Diagnosis present

## 2015-12-10 DIAGNOSIS — Z7951 Long term (current) use of inhaled steroids: Secondary | ICD-10-CM

## 2015-12-10 DIAGNOSIS — J9622 Acute and chronic respiratory failure with hypercapnia: Secondary | ICD-10-CM

## 2015-12-10 DIAGNOSIS — J9600 Acute respiratory failure, unspecified whether with hypoxia or hypercapnia: Secondary | ICD-10-CM | POA: Diagnosis not present

## 2015-12-10 DIAGNOSIS — K5900 Constipation, unspecified: Secondary | ICD-10-CM | POA: Diagnosis not present

## 2015-12-10 DIAGNOSIS — R0600 Dyspnea, unspecified: Secondary | ICD-10-CM | POA: Diagnosis not present

## 2015-12-10 DIAGNOSIS — Z9841 Cataract extraction status, right eye: Secondary | ICD-10-CM

## 2015-12-10 DIAGNOSIS — I481 Persistent atrial fibrillation: Secondary | ICD-10-CM | POA: Diagnosis not present

## 2015-12-10 DIAGNOSIS — F32A Depression, unspecified: Secondary | ICD-10-CM | POA: Diagnosis present

## 2015-12-10 DIAGNOSIS — B961 Klebsiella pneumoniae [K. pneumoniae] as the cause of diseases classified elsewhere: Secondary | ICD-10-CM | POA: Diagnosis present

## 2015-12-10 DIAGNOSIS — I110 Hypertensive heart disease with heart failure: Secondary | ICD-10-CM | POA: Diagnosis present

## 2015-12-10 DIAGNOSIS — I5031 Acute diastolic (congestive) heart failure: Secondary | ICD-10-CM | POA: Diagnosis not present

## 2015-12-10 DIAGNOSIS — J9601 Acute respiratory failure with hypoxia: Secondary | ICD-10-CM | POA: Diagnosis not present

## 2015-12-10 DIAGNOSIS — J9611 Chronic respiratory failure with hypoxia: Secondary | ICD-10-CM | POA: Diagnosis not present

## 2015-12-10 DIAGNOSIS — R32 Unspecified urinary incontinence: Secondary | ICD-10-CM | POA: Diagnosis not present

## 2015-12-10 DIAGNOSIS — Z8249 Family history of ischemic heart disease and other diseases of the circulatory system: Secondary | ICD-10-CM

## 2015-12-10 DIAGNOSIS — Z961 Presence of intraocular lens: Secondary | ICD-10-CM | POA: Diagnosis present

## 2015-12-10 DIAGNOSIS — K219 Gastro-esophageal reflux disease without esophagitis: Secondary | ICD-10-CM | POA: Diagnosis present

## 2015-12-10 DIAGNOSIS — R300 Dysuria: Secondary | ICD-10-CM

## 2015-12-10 DIAGNOSIS — N390 Urinary tract infection, site not specified: Secondary | ICD-10-CM | POA: Diagnosis present

## 2015-12-10 DIAGNOSIS — Z9842 Cataract extraction status, left eye: Secondary | ICD-10-CM

## 2015-12-10 DIAGNOSIS — R609 Edema, unspecified: Secondary | ICD-10-CM | POA: Diagnosis not present

## 2015-12-10 DIAGNOSIS — I10 Essential (primary) hypertension: Secondary | ICD-10-CM | POA: Diagnosis not present

## 2015-12-10 DIAGNOSIS — M6281 Muscle weakness (generalized): Secondary | ICD-10-CM | POA: Diagnosis not present

## 2015-12-10 DIAGNOSIS — R42 Dizziness and giddiness: Secondary | ICD-10-CM | POA: Diagnosis not present

## 2015-12-10 DIAGNOSIS — R109 Unspecified abdominal pain: Secondary | ICD-10-CM | POA: Diagnosis not present

## 2015-12-10 DIAGNOSIS — J441 Chronic obstructive pulmonary disease with (acute) exacerbation: Secondary | ICD-10-CM | POA: Diagnosis present

## 2015-12-10 DIAGNOSIS — I2510 Atherosclerotic heart disease of native coronary artery without angina pectoris: Secondary | ICD-10-CM | POA: Diagnosis present

## 2015-12-10 DIAGNOSIS — R000 Tachycardia, unspecified: Secondary | ICD-10-CM | POA: Diagnosis not present

## 2015-12-10 DIAGNOSIS — J440 Chronic obstructive pulmonary disease with acute lower respiratory infection: Secondary | ICD-10-CM | POA: Diagnosis not present

## 2015-12-10 DIAGNOSIS — Z79899 Other long term (current) drug therapy: Secondary | ICD-10-CM

## 2015-12-10 DIAGNOSIS — J9621 Acute and chronic respiratory failure with hypoxia: Secondary | ICD-10-CM | POA: Diagnosis present

## 2015-12-10 DIAGNOSIS — J9602 Acute respiratory failure with hypercapnia: Secondary | ICD-10-CM | POA: Diagnosis not present

## 2015-12-10 DIAGNOSIS — Z87891 Personal history of nicotine dependence: Secondary | ICD-10-CM

## 2015-12-10 DIAGNOSIS — I4891 Unspecified atrial fibrillation: Secondary | ICD-10-CM | POA: Diagnosis present

## 2015-12-10 DIAGNOSIS — E784 Other hyperlipidemia: Secondary | ICD-10-CM | POA: Diagnosis not present

## 2015-12-10 DIAGNOSIS — J9612 Chronic respiratory failure with hypercapnia: Secondary | ICD-10-CM | POA: Diagnosis not present

## 2015-12-10 DIAGNOSIS — R278 Other lack of coordination: Secondary | ICD-10-CM | POA: Diagnosis not present

## 2015-12-10 DIAGNOSIS — F329 Major depressive disorder, single episode, unspecified: Secondary | ICD-10-CM | POA: Diagnosis present

## 2015-12-10 DIAGNOSIS — Z9981 Dependence on supplemental oxygen: Secondary | ICD-10-CM | POA: Diagnosis not present

## 2015-12-10 DIAGNOSIS — Z7983 Long term (current) use of bisphosphonates: Secondary | ICD-10-CM

## 2015-12-10 DIAGNOSIS — R0602 Shortness of breath: Secondary | ICD-10-CM | POA: Diagnosis not present

## 2015-12-10 DIAGNOSIS — I480 Paroxysmal atrial fibrillation: Secondary | ICD-10-CM | POA: Diagnosis not present

## 2015-12-10 DIAGNOSIS — E785 Hyperlipidemia, unspecified: Secondary | ICD-10-CM | POA: Diagnosis present

## 2015-12-10 LAB — I-STAT ARTERIAL BLOOD GAS, ED
Acid-Base Excess: 8 mmol/L — ABNORMAL HIGH (ref 0.0–2.0)
Bicarbonate: 36.8 mmol/L — ABNORMAL HIGH (ref 20.0–28.0)
O2 Saturation: 100 %
PCO2 ART: 68.5 mmHg — AB (ref 32.0–48.0)
PH ART: 7.339 — AB (ref 7.350–7.450)
TCO2: 39 mmol/L (ref 0–100)
pO2, Arterial: 202 mmHg — ABNORMAL HIGH (ref 83.0–108.0)

## 2015-12-10 LAB — COMPREHENSIVE METABOLIC PANEL
ALBUMIN: 3.4 g/dL — AB (ref 3.5–5.0)
ALT: 19 U/L (ref 14–54)
AST: 19 U/L (ref 15–41)
Alkaline Phosphatase: 67 U/L (ref 38–126)
Anion gap: 11 (ref 5–15)
BILIRUBIN TOTAL: 0.8 mg/dL (ref 0.3–1.2)
BUN: 11 mg/dL (ref 6–20)
CHLORIDE: 92 mmol/L — AB (ref 101–111)
CO2: 32 mmol/L (ref 22–32)
CREATININE: 0.7 mg/dL (ref 0.44–1.00)
Calcium: 9.3 mg/dL (ref 8.9–10.3)
GFR calc Af Amer: >60 mL/min (ref >60)
GFR calc non Af Amer: >60 mL/min (ref >60)
GLUCOSE: 109 mg/dL — AB (ref 65–99)
POTASSIUM: 4.3 mmol/L (ref 3.5–5.1)
Sodium: 135 mmol/L (ref 135–145)
TOTAL PROTEIN: 6.4 g/dL — AB (ref 6.5–8.1)

## 2015-12-10 LAB — BRAIN NATRIURETIC PEPTIDE: B Natriuretic Peptide: 735.3 pg/mL — ABNORMAL HIGH (ref 0.0–100.0)

## 2015-12-10 LAB — CBC WITH DIFFERENTIAL/PLATELET
BASOS ABS: 0 10*3/uL (ref 0.0–0.1)
BASOS PCT: 0 %
Eosinophils Absolute: 0 10*3/uL (ref 0.0–0.7)
Eosinophils Relative: 0 %
HEMATOCRIT: 40.8 % (ref 36.0–46.0)
Hemoglobin: 11.8 g/dL — ABNORMAL LOW (ref 12.0–15.0)
LYMPHS PCT: 8 %
Lymphs Abs: 0.8 10*3/uL (ref 0.7–4.0)
MCH: 26.3 pg (ref 26.0–34.0)
MCHC: 28.9 g/dL — ABNORMAL LOW (ref 30.0–36.0)
MCV: 90.9 fL (ref 78.0–100.0)
MONO ABS: 0.9 10*3/uL (ref 0.1–1.0)
Monocytes Relative: 9 %
NEUTROS ABS: 7.9 10*3/uL — AB (ref 1.7–7.7)
NEUTROS PCT: 83 %
Platelets: 142 10*3/uL — ABNORMAL LOW (ref 150–400)
RBC: 4.49 MIL/uL (ref 3.87–5.11)
RDW: 16.5 % — AB (ref 11.5–15.5)
WBC: 9.6 10*3/uL (ref 4.0–10.5)

## 2015-12-10 LAB — PROTIME-INR
INR: 1.27
Prothrombin Time: 16 seconds — ABNORMAL HIGH (ref 11.4–15.2)

## 2015-12-10 LAB — TROPONIN I

## 2015-12-10 MED ORDER — LEVALBUTEROL HCL 0.63 MG/3ML IN NEBU
0.6300 mg | INHALATION_SOLUTION | Freq: Once | RESPIRATORY_TRACT | Status: AC
  Administered 2015-12-10: 0.63 mg via RESPIRATORY_TRACT
  Filled 2015-12-10: qty 3

## 2015-12-10 MED ORDER — SERTRALINE HCL 50 MG PO TABS
25.0000 mg | ORAL_TABLET | Freq: Every day | ORAL | Status: DC
  Administered 2015-12-10 – 2015-12-15 (×6): 25 mg via ORAL
  Filled 2015-12-10 (×6): qty 1

## 2015-12-10 MED ORDER — ALBUTEROL SULFATE (2.5 MG/3ML) 0.083% IN NEBU
5.0000 mg | INHALATION_SOLUTION | Freq: Once | RESPIRATORY_TRACT | Status: DC
  Filled 2015-12-10: qty 6

## 2015-12-10 MED ORDER — DABIGATRAN ETEXILATE MESYLATE 150 MG PO CAPS
150.0000 mg | ORAL_CAPSULE | Freq: Two times a day (BID) | ORAL | Status: DC
  Administered 2015-12-10 – 2015-12-16 (×12): 150 mg via ORAL
  Filled 2015-12-10 (×12): qty 1

## 2015-12-10 MED ORDER — LEVALBUTEROL HCL 0.63 MG/3ML IN NEBU: 0.6300 mg | INHALATION_SOLUTION | Freq: Four times a day (QID) | RESPIRATORY_TRACT | Status: DC | PRN

## 2015-12-10 MED ORDER — FERROUS SULFATE 325 (65 FE) MG PO TABS
325.0000 mg | ORAL_TABLET | Freq: Every day | ORAL | Status: DC
  Administered 2015-12-11 – 2015-12-16 (×6): 325 mg via ORAL
  Filled 2015-12-10 (×6): qty 1

## 2015-12-10 MED ORDER — DILTIAZEM HCL-DEXTROSE 100-5 MG/100ML-% IV SOLN (PREMIX)
5.0000 mg/h | INTRAVENOUS | Status: DC
  Administered 2015-12-10: 5 mg/h via INTRAVENOUS
  Administered 2015-12-10: 7.5 mg/h via INTRAVENOUS
  Administered 2015-12-11 (×2): 15 mg/h via INTRAVENOUS
  Filled 2015-12-10 (×4): qty 100

## 2015-12-10 MED ORDER — PANTOPRAZOLE SODIUM 40 MG PO TBEC
40.0000 mg | DELAYED_RELEASE_TABLET | Freq: Every day | ORAL | Status: DC
  Administered 2015-12-11 – 2015-12-16 (×6): 40 mg via ORAL
  Filled 2015-12-10 (×7): qty 1

## 2015-12-10 MED ORDER — METHYLPREDNISOLONE SODIUM SUCC 125 MG IJ SOLR
125.0000 mg | Freq: Once | INTRAMUSCULAR | Status: AC
  Administered 2015-12-10: 125 mg via INTRAVENOUS
  Filled 2015-12-10: qty 2

## 2015-12-10 MED ORDER — AZITHROMYCIN 250 MG PO TABS
500.0000 mg | ORAL_TABLET | Freq: Every day | ORAL | Status: AC
  Administered 2015-12-10 – 2015-12-14 (×5): 500 mg via ORAL
  Filled 2015-12-10 (×6): qty 2

## 2015-12-10 MED ORDER — FUROSEMIDE 10 MG/ML IJ SOLN
40.0000 mg | Freq: Every day | INTRAMUSCULAR | Status: DC
  Administered 2015-12-11: 40 mg via INTRAVENOUS
  Filled 2015-12-10: qty 4

## 2015-12-10 MED ORDER — DILTIAZEM LOAD VIA INFUSION
10.0000 mg | Freq: Once | INTRAVENOUS | Status: DC
  Filled 2015-12-10: qty 10

## 2015-12-10 MED ORDER — ATORVASTATIN CALCIUM 40 MG PO TABS
40.0000 mg | ORAL_TABLET | Freq: Every morning | ORAL | Status: DC
  Administered 2015-12-11 – 2015-12-16 (×6): 40 mg via ORAL
  Filled 2015-12-10 (×6): qty 1

## 2015-12-10 MED ORDER — DOCUSATE SODIUM 100 MG PO CAPS
100.0000 mg | ORAL_CAPSULE | Freq: Two times a day (BID) | ORAL | Status: DC
  Administered 2015-12-10 – 2015-12-16 (×8): 100 mg via ORAL
  Filled 2015-12-10 (×8): qty 1

## 2015-12-10 MED ORDER — TIOTROPIUM BROMIDE MONOHYDRATE 18 MCG IN CAPS
18.0000 ug | ORAL_CAPSULE | Freq: Every day | RESPIRATORY_TRACT | Status: DC
  Administered 2015-12-12 – 2015-12-16 (×5): 18 ug via RESPIRATORY_TRACT
  Filled 2015-12-10 (×2): qty 5

## 2015-12-10 MED ORDER — FUROSEMIDE 10 MG/ML IJ SOLN
20.0000 mg | Freq: Once | INTRAMUSCULAR | Status: AC
  Administered 2015-12-10: 20 mg via INTRAVENOUS
  Filled 2015-12-10: qty 2

## 2015-12-10 MED ORDER — ACETAMINOPHEN 325 MG PO TABS
650.0000 mg | ORAL_TABLET | Freq: Four times a day (QID) | ORAL | Status: DC | PRN
  Administered 2015-12-10 – 2015-12-16 (×6): 650 mg via ORAL
  Filled 2015-12-10 (×7): qty 2

## 2015-12-10 MED ORDER — METHYLPREDNISOLONE SODIUM SUCC 125 MG IJ SOLR
80.0000 mg | Freq: Two times a day (BID) | INTRAMUSCULAR | Status: DC
  Administered 2015-12-10 – 2015-12-15 (×10): 80 mg via INTRAVENOUS
  Filled 2015-12-10 (×10): qty 2

## 2015-12-10 MED ORDER — SODIUM CHLORIDE 0.9% FLUSH
3.0000 mL | Freq: Two times a day (BID) | INTRAVENOUS | Status: DC
  Administered 2015-12-10 – 2015-12-16 (×9): 3 mL via INTRAVENOUS

## 2015-12-10 MED ORDER — IPRATROPIUM BROMIDE 0.02 % IN SOLN: 0.5000 mg | Freq: Four times a day (QID) | RESPIRATORY_TRACT | Status: DC | PRN

## 2015-12-10 MED ORDER — OXYBUTYNIN CHLORIDE ER 10 MG PO TB24
10.0000 mg | ORAL_TABLET | Freq: Every day | ORAL | Status: DC
  Administered 2015-12-10 – 2015-12-15 (×6): 10 mg via ORAL
  Filled 2015-12-10 (×8): qty 1

## 2015-12-10 NOTE — ED Provider Notes (Signed)
[facility] DEPT Provider Note   CSN: [id number] Arrival date & time: 12/10/15  1400     History   Chief Complaint Chief Complaint  Patient presents with  . Shortness of Breath    HPI [name] [name] is a 75 y.o. female.  Level V caveat for respiratory distress. Patient brought in by EMS with 2 day history of shortness of breath and generalized weakness. She has COPD and is on home oxygen 3 L which was increased to 6 by EMS. Reports cough productive of clear mucus. Denies chest pain. Endorses worsening leg swelling and states she's been out of her Lasix though she does have Lasix in her medication bag. He was found to be in atrial fibrillation with RVR on arrival. States she's felt her heart racing for the past several days. She was recently switched from [place] 2 pradaxa for unknown reason.   The history is provided by the patient.  Shortness of Breath  Associated symptoms include cough and leg swelling. Pertinent negatives include no fever, no headaches, no rhinorrhea, no chest pain, no vomiting and no abdominal pain.    Past Medical History:  Diagnosis Date  . Anemia   . Arthritis    "back" (11/21/2014)  . Asthma   . Blood dyscrasia    hx blood clotts  . Chronic bronchitis ([place])   . Chronic kidney disease   . Chronic lower back pain   . 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 GERD (gastroesophageal reflux disease)   . [id number])    "a few times/year" (11/21/2014)  . History of blood transfusion X 2   "related to blood thinner I was taking"  . Hyperlipidemia   . Hypertension   . Migraine    "weekly probably" (11/21/2014)  . On home oxygen therapy    "3L; 24/7" (11/21/2014)  . Pneumonia    hx pneumonia   . Pneumothorax on right 09/2014  . Pulmonary embolism ([place])   . Shortness of breath   . Urinary incontinence     Patient Active Problem List   Diagnosis Date Noted  . Pneumothorax, right 11/20/2014  . Pneumothorax   .  Recurrent spontaneous pneumothorax   . Chest tube in place   . Pneumothorax on right 10/09/2014  . Pneumothorax on left 09/14/2014  . Gross hematuria   . Hematuria 06/10/2014  . Cystitis, acute hemorrhagic 06/08/2014  . Hematochezia 06/08/2014  . Acute pyelonephritis 04/28/2014  . COLD (chronic obstructive lung disease) ([place])   . Essential hypertension   . CAP (community acquired pneumonia) 04/27/2014  . SOB (shortness of breath) 04/27/2014  . Microcytic anemia 04/27/2014  . Hemorrhagic cystitis 08/03/2013  . Anemia due to acute blood loss 08/03/2013  . COPD exacerbation ([place]) 07/08/2013  . COPD with acute exacerbation ([place]) 07/08/2013  . Paroxysmal atrial fibrillation ([place]) 02/03/2012  . UTI (lower urinary tract infection) 02/02/2012  . Thrombocytopenia ([place]) 02/02/2012  . Fracture of right hip ([place]) 01/29/2012  . Multiple rib fractures 01/29/2012  . Fall due to stumbling 01/29/2012  . CAD (coronary artery disease) 01/29/2012  . Hypertension 01/29/2012  . COPD (chronic obstructive pulmonary disease) ([place]) 01/29/2012  . CKD (chronic kidney disease) 01/29/2012  . Osteoarthritis 01/29/2012  . Closed fracture of right distal radius 10/13/2011    Class: Acute    Past Surgical History:  Procedure Laterality Date  . ABDOMINAL HYSTERECTOMY    . CATARACT EXTRACTION W/ INTRAOCULAR LENS  IMPLANT, BILATERAL Bilateral   .  CYSTOSCOPY W/ RETROGRADES Bilateral 06/11/2014   Procedure: CYSTOSCOPY WITH RETROGRADE PYELOGRAM;  Surgeon: [name], MD;  Location: [facility];  Service: Urology;  Laterality: Bilateral;  . FRACTURE SURGERY    . HARDWARE REMOVAL  01/29/2012   Procedure: HARDWARE REMOVAL;  Surgeon: [name], MD;  Location: [facility];  Service: Orthopedics;  Laterality: Right;  . HEMIARTHROPLASTY SHOULDER FRACTURE Right   . HIP ARTHROPLASTY  01/29/2012   Procedure: ARTHROPLASTY BIPOLAR HIP;  Surgeon: [name], MD;  Location: [facility];  Service: Orthopedics;  Laterality: Right;  .  LAPAROSCOPIC CHOLECYSTECTOMY    . ORIF WRIST FRACTURE  10/13/2011   Procedure: OPEN REDUCTION INTERNAL FIXATION (ORIF) WRIST FRACTURE;  Surgeon: [name], MD;  Location: [facility];  Service: Orthopedics;  Laterality: Right;  Open Reduction Internal Fixation Right Distal Radius   . TRANSURETHRAL RESECTION OF BLADDER TUMOR N/A 06/11/2014   Procedure: TRANSURETHRAL RESECTION OF BLADDER TUMOR (TURBT);  Surgeon: [name], MD;  Location: [facility];  Service: Urology;  Laterality: N/A;  . TUBAL LIGATION      OB History    No data available       Home Medications    Prior to Admission medications   Medication Sig Start Date End Date Taking? Authorizing Provider  albuterol (PROVENTIL) (2.5 MG/3ML) 0.083% nebulizer solution Take 2.5 mg by nebulization every 6 (six) hours as needed for wheezing or shortness of breath.     Historical Provider, MD  aspirin-sod bicarb-citric acid (ALKA-SELTZER) 325 MG TBEF tablet Take 325 mg by mouth every 6 (six) hours as needed (for stomach).    Historical Provider, MD  atorvastatin (LIPITOR) 40 MG tablet Take 40 mg by mouth every morning.  07/02/13   Historical Provider, MD  cephALEXin (KEFLEX) 500 MG capsule Take 1 capsule (500 mg total) by mouth 2 (two) times daily. 11/24/15   Antonietta Breach, PA-C  Cranberry-Vitamin C-Probiotic (AZO CRANBERRY PO) Take 2 capsules by mouth daily.    Historical Provider, MD  cyanocobalamin 500 MCG tablet Take 500 mcg by mouth daily.    Historical Provider, MD  diltiazem (CARDIZEM CD) 240 MG 24 hr capsule Take 240 mg by mouth every morning.  04/08/14   Historical Provider, MD  docusate sodium (COLACE) 100 MG capsule Take 1 capsule (100 mg total) by mouth every 12 (twelve) hours. 10/21/15   Tanna Furry, MD  fluticasone (FLONASE) 50 MCG/ACT nasal spray Place 1 spray into both nostrils daily as needed for allergies or rhinitis. 08/06/13   Barton Dubois, MD  furosemide (LASIX) 20 MG tablet Take 1 tablet (20 mg total) by mouth daily. Patient taking  differently: Take 20 mg by mouth daily as needed for fluid or edema.  06/13/14   Theodis Blaze, MD  HYDROcodone-acetaminophen (NORCO/VICODIN) 5-325 MG tablet Take 1 tablet by mouth every 4 (four) hours as needed. 10/21/15   Tanna Furry, MD  metoprolol succinate (TOPROL-XL) 25 MG 24 hr tablet Take 12.5 mg by mouth every morning. 07/21/15   Historical Provider, MD  Omega-3 Fatty Acids (FISH OIL) 1000 MG CAPS Take 1,000 mg by mouth daily.     Historical Provider, MD  oxybutynin (DITROPAN-XL) 5 MG 24 hr tablet Take 10 mg by mouth at bedtime.    Historical Provider, MD  pantoprazole (PROTONIX) 40 MG tablet Take 1 tablet (40 mg total) by mouth daily. [id number]   Delora Fuel, MD  predniSONE (DELTASONE) 20 MG tablet Take 2 tablets (40 mg total) by mouth daily with breakfast. Patient not  taking: Reported on 10/21/2015 09/25/15   Milagros Loll, MD  rivaroxaban (XARELTO) 20 MG TABS tablet Take 20 mg by mouth daily with supper.    Historical Provider, MD  sertraline (ZOLOFT) 25 MG tablet Take 1 tablet (25 mg total) by mouth at bedtime. 09/24/15   Carly Montey Hora, MD  sulfamethoxazole-trimethoprim (BACTRIM DS,SEPTRA DS) 800-160 MG tablet Take 1 tablet by mouth every 12 (twelve) hours. Patient not taking: Reported on 10/21/2015 09/24/15   Juliet Rude, MD  tiotropium (SPIRIVA) 18 MCG inhalation capsule Place 1 capsule (18 mcg total) into inhaler and inhale daily. 08/06/13   Barton Dubois, MD  tiZANidine (ZANAFLEX) 2 MG tablet Take 2 mg by mouth at bedtime.    Historical Provider, MD  VENTOLIN HFA 108 (90 BASE) MCG/ACT inhaler Inhale 2 puffs into the lungs 4 (four) times daily as needed for wheezing or shortness of breath.  06/07/14   Historical Provider, MD    Family History Family History  Problem Relation Age of Onset  . Cancer - Lung Mother   . Coronary artery disease Mother   . Coronary artery disease Sister   . Coronary artery disease Brother     Social History Social History  Substance Use Topics  . Smoking  status: Former Smoker    Packs/day: 1.00    Years: 56.00    Types: Cigarettes    Quit date: 02/12/2014  . Smokeless tobacco: Never Used  . Alcohol use No     Allergies   Eggs or egg-derived products   Review of Systems Review of Systems  Constitutional: Positive for fatigue. Negative for activity change, appetite change and fever.  HENT: Negative for congestion and rhinorrhea.   Respiratory: Positive for cough and shortness of breath.   Cardiovascular: Positive for leg swelling. Negative for chest pain.  Gastrointestinal: Positive for nausea. Negative for abdominal pain and vomiting.  Genitourinary: Negative for dysuria, hematuria, vaginal bleeding and vaginal discharge.  Musculoskeletal: Negative for arthralgias and myalgias.  Skin: Negative for wound.  Neurological: Negative for dizziness, weakness, light-headedness and headaches.  A complete 10 system review of systems was obtained and all systems are negative except as noted in the HPI and PMH.     Physical Exam Updated Vital Signs There were no vitals taken for this visit.  Physical Exam  Constitutional: She is oriented to person, place, and time. She appears well-developed and well-nourished. She appears distressed.  Increased work of breathing, speaking in short sentences.  HENT:  Head: Normocephalic and atraumatic.  Mouth/Throat: Oropharynx is clear and moist. No oropharyngeal exudate.  Eyes: Conjunctivae and EOM are normal. Pupils are equal, round, and reactive to light.  Neck: Normal range of motion. Neck supple.  No meningismus.  Cardiovascular: Normal rate, normal heart sounds and intact distal pulses.   No murmur heard. Irregular tachycardia  Pulmonary/Chest: She is in respiratory distress. She has wheezes.  Decreased breath sounds  Abdominal: Soft. There is no tenderness. There is no rebound and no guarding.  Musculoskeletal: Normal range of motion. She exhibits edema. She exhibits no tenderness.  +2  edema to knees  Neurological: She is alert and oriented to person, place, and time. No cranial nerve deficit. She exhibits normal muscle tone. Coordination normal.  No ataxia on finger to nose bilaterally. No pronator drift. 5/5 strength throughout. CN 2-12 intact.Equal grip strength. Sensation intact.   Skin: Skin is warm.  Psychiatric: She has a normal mood and affect. Her behavior is normal.  Nursing note and  vitals reviewed.    ED Treatments / Results  Labs (all labs ordered are listed, but only abnormal results are displayed) Labs Reviewed  CBC WITH DIFFERENTIAL/PLATELET - Abnormal; Notable for the following:       Result Value   Hemoglobin 11.8 (*)    MCHC 28.9 (*)    RDW 16.5 (*)    Platelets 142 (*)    Neutro Abs 7.9 (*)    All other components within normal limits  COMPREHENSIVE METABOLIC PANEL - Abnormal; Notable for the following:    Chloride 92 (*)    Glucose, Bld 109 (*)    Total Protein 6.4 (*)    Albumin 3.4 (*)    All other components within normal limits  BRAIN NATRIURETIC PEPTIDE - Abnormal; Notable for the following:    B Natriuretic Peptide 735.3 (*)    All other components within normal limits  PROTIME-INR - Abnormal; Notable for the following:    Prothrombin Time 16.0 (*)    All other components within normal limits  I-STAT ARTERIAL BLOOD GAS, ED - Abnormal; Notable for the following:    pH, Arterial 7.339 (*)    pCO2 arterial 68.5 (*)    pO2, Arterial 202.0 (*)    Bicarbonate 36.8 (*)    Acid-Base Excess 8.0 (*)    All other components within normal limits  TROPONIN I  URINALYSIS, ROUTINE W REFLEX MICROSCOPIC (NOT AT [facility])  BASIC METABOLIC PANEL  CBC    EKG  EKG Interpretation  Date/Time:  Wednesday December 10 2015 14:16:40 EDT Ventricular Rate:  141 PR Interval:    QRS Duration: 90 QT Interval:  290 QTC Calculation: 445 R Axis:   63 Text Interpretation:  Atrial fibrillation with rapid V-rate Probable LVH with secondary repol abnrm now  A fib with RVR Nonspecific T wave abnormality Confirmed by Wyvonnia Dusky  MD, Alyla Pietila [phone]) on 12/10/2015 2:58:27 PM       Radiology Dg Chest Portable 1 View  Result Date: 12/10/2015 CLINICAL DATA:  Nausea and shortness of breath for the last 2 days. Weakness. EXAM: PORTABLE CHEST 1 VIEW COMPARISON:  09/23/2015 FINDINGS: Severe emphysema with biapical scarring and postoperative the wedge resection clips at the right lung apex. The degree of apical volume loss/ scarring appears mildly increased from the prior exam although some of this may be positional. Extensive kyphosis. Atherosclerotic aortic arch and tortuosity. Newly indistinct hemidiaphragms partially from projection but also potentially from bibasilar airspace opacities. Heart size felt to be within normal limits. Scattered scarring in the lungs. Chronic deformity from old fracture the right proximal humerus, healed. Bilateral airway thickening noted. IMPRESSION: 1. Bibasilar airspace opacities potentially from atelectasis or pneumonia. 2. Extensive biapical pleuroparenchymal scarring with some postoperative findings at the right lung apex. 3. Atherosclerotic aortic arch. 4. Severe emphysema. 5. Increased bilateral airway thickening suggesting inflammation/bronchitis. Electronically Signed   By: Van Clines M.D.   On: 12/10/2015 15:09    Procedures Procedures (including critical care time)  Medications Ordered in ED Medications  albuterol (PROVENTIL) (2.5 MG/3ML) 0.083% nebulizer solution 5 mg (not administered)  diltiazem (CARDIZEM) 1 mg/mL load via infusion 10 mg (not administered)    And  diltiazem (CARDIZEM) 100 mg in dextrose 5% 167mL (1 mg/mL) infusion (not administered)  furosemide (LASIX) injection 20 mg (not administered)  methylPREDNISolone sodium succinate (SOLU-MEDROL) 125 mg/2 mL injection 125 mg (125 mg Intravenous Given 12/10/15 1452)  levalbuterol (XOPENEX) nebulizer solution 0.63 mg (0.63 mg Nebulization Given 12/10/15  1452)  Initial Impression / Assessment and Plan / ED Course  I have reviewed the triage vital signs and the nursing notes.  Pertinent labs & imaging results that were available during my care of the patient were reviewed by me and considered in my medical decision making (see chart for details).  Clinical Course   Patient from home with palpitations and shortness of breath. Age of fibrillation with RVR on arrival. Decreased breath sounds without frank wheezing.  Nebs, steroids, Cardizem gtt for Afib. CXR with mild edema, IV lasix given.  EF normal on last echo Slight CO2 retention on ABG. But patient mentating well, no dyspnea with conversation. O2 was increased by EMS, will turn down. Hold on bipap at this time.  Multifactorial dyspnea from COPD and CHF.  Afib with RVR. Admission d/w Dr. Maylene Roes.  CRITICAL CARE Performed by: Ezequiel Essex Total critical care time: 35 minutes Critical care time was exclusive of separately billable procedures and treating other patients. Critical care was necessary to treat or prevent imminent or life-threatening deterioration. Critical care was time spent personally by me on the following activities: development of treatment plan with patient and/or surrogate as well as nursing, discussions with consultants, evaluation of patient's response to treatment, examination of patient, obtaining history from patient or surrogate, ordering and performing treatments and interventions, ordering and review of laboratory studies, ordering and review of radiographic studies, pulse oximetry and re-evaluation of patient's condition.   BP 128/85 (BP Location: Right Arm)   Pulse (!) 117   Temp 98.3 F (36.8 C) (Oral)   Resp (!) 25   Ht 5\' 1"  (1.549 m)   Wt 129 lb 12.8 oz (58.9 kg)   SpO2 99%   BMI 24.53 kg/m      Final Clinical Impressions(s) / ED Diagnoses   Final diagnoses:  Atrial fibrillation with RVR (HCC)  COPD exacerbation (HCC)    New  Prescriptions New Prescriptions   No medications on file     Ezequiel Essex, MD 12/10/15 1926

## 2015-12-10 NOTE — ED Notes (Signed)
Report attempted 

## 2015-12-10 NOTE — H&P (Addendum)
History and Physical    [name] [name] [id number] DOB: 1940/06/18 DOA: 12/10/2015  PCP: Leonides Sake, MD  Patient coming from: Home  Chief Complaint: shortness of breath  HPI: [name] [name] is a 75 y.o. female with medical history significant of chronic respiratory failure, oxygen requiring at baseline, COPD, paroxysmal A. fib, history of right-sided pneumothorax 3 required tube thoracostomy, coronary artery disease, hypertension who presents with complaint of shortness of breath. She states that over the past 2 days, she has been short of breath and has also noticed heart palpitations starting last night. She denies any chest discomfort. She admits to cough with white sputum production. She denies any fevers, chills, rigors at home. She states that she had some nausea and vomiting last night as well but denies any symptoms at this time. She denies any abdominal discomfort but states that she has some burning with urination. Also admits to worsening peripheral edema.   ED Course: started Cardizem drip, Lasix 20 mg IV, Solu-Medrol 125 mg Iv, and Xopenex.  Review of Systems: As per HPI otherwise 10 point review of systems negative.   Past Medical History:  Diagnosis Date  . Anemia   . Arthritis    "back" (11/21/2014)  . Asthma   . Blood dyscrasia    hx blood clotts  . Chronic bronchitis ([place])   . Chronic kidney disease   . Chronic lower back pain   . 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 GERD (gastroesophageal reflux disease)   . [id number])    "a few times/year" (11/21/2014)  . History of blood transfusion X 2   "related to blood thinner I was taking"  . Hyperlipidemia   . Hypertension   . Migraine    "weekly probably" (11/21/2014)  . On home oxygen therapy    "3L; 24/7" (11/21/2014)  . Pneumonia    hx pneumonia   . Pneumothorax on right 09/2014  . Pulmonary embolism ([place])   . Shortness of breath   . Urinary incontinence     Past  Surgical History:  Procedure Laterality Date  . ABDOMINAL HYSTERECTOMY    . CATARACT EXTRACTION W/ INTRAOCULAR LENS  IMPLANT, BILATERAL Bilateral   . CYSTOSCOPY W/ RETROGRADES Bilateral 06/11/2014   Procedure: CYSTOSCOPY WITH RETROGRADE PYELOGRAM;  Surgeon: [name], MD;  Location: [facility];  Service: Urology;  Laterality: Bilateral;  . FRACTURE SURGERY    . HARDWARE REMOVAL  01/29/2012   Procedure: HARDWARE REMOVAL;  Surgeon: [name], MD;  Location: [place] [place];  Service: Orthopedics;  Laterality: Right;  . HEMIARTHROPLASTY SHOULDER FRACTURE Right   . HIP ARTHROPLASTY  01/29/2012   Procedure: ARTHROPLASTY BIPOLAR HIP;  Surgeon: [name], MD;  Location: [facility];  Service: Orthopedics;  Laterality: Right;  . LAPAROSCOPIC CHOLECYSTECTOMY    . ORIF WRIST FRACTURE  10/13/2011   Procedure: OPEN REDUCTION INTERNAL FIXATION (ORIF) WRIST FRACTURE;  Surgeon: [name], MD;  Location: [place];  Service: Orthopedics;  Laterality: Right;  Open Reduction Internal Fixation Right Distal Radius   . TRANSURETHRAL RESECTION OF BLADDER TUMOR N/A 06/11/2014   Procedure: TRANSURETHRAL RESECTION OF BLADDER TUMOR (TURBT);  Surgeon: [name], MD;  Location: [facility];  Service: Urology;  Laterality: N/A;  . TUBAL LIGATION       reports that she quit smoking about 21 months ago. Her smoking use included Cigarettes. She has a 56.00 pack-year smoking history. She has never used smokeless tobacco. She reports that  she does not drink alcohol or use drugs.  Allergies  Allergen Reactions  . Eggs Or Egg-Derived Products Other (See Comments)    Dislikes eggs, every day. States no allergy    Family History  Problem Relation Age of Onset  . Cancer - Lung Mother   . Coronary artery disease Mother   . Coronary artery disease Sister   . Coronary artery disease Brother     Prior to Admission medications   Medication Sig Start Date End Date Taking? Authorizing Provider  albuterol (PROVENTIL HFA;VENTOLIN HFA)  108 (90 Base) MCG/ACT inhaler Inhale 2 puffs into the lungs 4 (four) times daily as needed for wheezing or shortness of breath.   Yes Historical Provider, MD  albuterol (PROVENTIL) (2.5 MG/3ML) 0.083% nebulizer solution Take 2.5 mg by nebulization every 6 (six) hours as needed for wheezing or shortness of breath.    Yes Historical Provider, MD  alendronate (FOSAMAX) 70 MG tablet Take 70 mg by mouth once a week. Take with a full glass of water on an empty stomach.   Yes Historical Provider, MD  aspirin-sod bicarb-citric acid (ALKA-SELTZER) 325 MG TBEF tablet Take 325 mg by mouth every 6 (six) hours as needed (for stomach).   Yes Historical Provider, MD  atorvastatin (LIPITOR) 40 MG tablet Take 40 mg by mouth every morning.  07/02/13  Yes Historical Provider, MD  calcitonin, salmon, (MIACALCIN/FORTICAL) 200 UNIT/ACT nasal spray Place 1 spray into alternate nostrils daily. 12/08/15  Yes Historical Provider, MD  Cranberry-Vitamin C-Probiotic (AZO CRANBERRY PO) Take 2 capsules by mouth daily.   Yes Historical Provider, MD  cyanocobalamin 500 MCG tablet Take 500 mcg by mouth daily.   Yes Historical Provider, MD  dabigatran (PRADAXA) 150 MG CAPS capsule Take 150 mg by mouth 2 (two) times daily.   Yes Historical Provider, MD  diltiazem (CARDIZEM CD) 240 MG 24 hr capsule Take 240 mg by mouth every morning.  04/08/14  Yes Historical Provider, MD  docusate sodium (COLACE) 100 MG capsule Take 1 capsule (100 mg total) by mouth every 12 (twelve) hours. 10/21/15  Yes Tanna Furry, MD  ferrous sulfate 325 (65 FE) MG tablet Take 325 mg by mouth daily with breakfast.   Yes Historical Provider, MD  fluticasone (FLONASE) 50 MCG/ACT nasal spray Place 1 spray into both nostrils daily as needed for allergies or rhinitis. 08/06/13  Yes Barton Dubois, MD  furosemide (LASIX) 20 MG tablet Take 1 tablet (20 mg total) by mouth daily. 06/13/14  Yes Theodis Blaze, MD  metoprolol succinate (TOPROL-XL) 25 MG 24 hr tablet Take 12.5 mg by mouth  every morning. 07/21/15  Yes Historical Provider, MD  Omega-3 Fatty Acids (FISH OIL) 1000 MG CAPS Take 1,000 mg by mouth daily.    Yes Historical Provider, MD  oxybutynin (DITROPAN-XL) 5 MG 24 hr tablet Take 10 mg by mouth at bedtime.   Yes Historical Provider, MD  pantoprazole (PROTONIX) 40 MG tablet Take 1 tablet (40 mg total) by mouth daily. [id number]  Yes Delora Fuel, MD  predniSONE (DELTASONE) 20 MG tablet Take 2 tablets (40 mg total) by mouth daily with breakfast. 09/25/15  Yes Milagros Loll, MD  rivaroxaban (XARELTO) 20 MG TABS tablet Take 20 mg by mouth daily with supper.   Yes Historical Provider, MD  sertraline (ZOLOFT) 25 MG tablet Take 1 tablet (25 mg total) by mouth at bedtime. 09/24/15  Yes Carly Montey Hora, MD  sulfamethoxazole-trimethoprim (BACTRIM DS,SEPTRA DS) 800-160 MG tablet Take 1 tablet by mouth every  12 (twelve) hours. 09/24/15  Yes Juliet Rude, MD  tiotropium (SPIRIVA) 18 MCG inhalation capsule Place 1 capsule (18 mcg total) into inhaler and inhale daily. 08/06/13  Yes Barton Dubois, MD  tiZANidine (ZANAFLEX) 2 MG tablet Take 2 mg by mouth at bedtime.   Yes Historical Provider, MD  VENTOLIN HFA 108 (90 BASE) MCG/ACT inhaler Inhale 2 puffs into the lungs 4 (four) times daily as needed for wheezing or shortness of breath.  06/07/14  Yes Historical Provider, MD  cephALEXin (KEFLEX) 500 MG capsule Take 1 capsule (500 mg total) by mouth 2 (two) times daily. Patient not taking: Reported on 12/10/2015 11/24/15   Antonietta Breach, PA-C  HYDROcodone-acetaminophen (NORCO/VICODIN) 5-325 MG tablet Take 1 tablet by mouth every 4 (four) hours as needed. Patient not taking: Reported on 12/10/2015 10/21/15   Tanna Furry, MD    Physical Exam: Vitals:   12/10/15 1645 12/10/15 1700 12/10/15 1715 12/10/15 1730  BP: 107/66 130/95 115/71 (!) 133/112  Pulse: 72 95 77 (!) 147  Resp: 17 19 22 14   SpO2: 99% 98% 98% 98%    Constitutional: NAD, calm, nontoxic-appearing Eyes: PERRL, lids and conjunctivae  normal ENMT: Mucous membranes are moist. Posterior pharynx clear of any exudate or lesions. Neck: normal, supple, no masses, no thyromegaly Respiratory: diminished breath sounds diffusely, on [place] O2, no conversational dyspnea Cardiovascular: tachycardic, irregular rhythm. +2 pitting edema bilaterally  Abdomen: no tenderness, no masses palpated. No hepatosplenomegaly. Bowel sounds positive.  Musculoskeletal: no clubbing / cyanosis. No joint deformity upper and lower extremities. Good ROM, no contractures. Normal muscle tone.  Skin: no rashes, lesions, ulcers. No induration Neurologic: CN 2-12 grossly intact.  Strength 5/5 in all 4.  Psychiatric: Normal judgment and insight. Alert and oriented x 3. Normal mood.   Labs on Admission: I have personally reviewed following labs and imaging studies  CBC:  Recent Labs Lab 12/10/15 1526  WBC 9.6  NEUTROABS 7.9*  HGB 11.8*  HCT 40.8  MCV 90.9  PLT [id number]*   Basic Metabolic Panel:  Recent Labs Lab 12/10/15 1526  NA 135  K 4.3  CL 92*  CO2 32  GLUCOSE 109*  BUN 11  CREATININE 0.70  CALCIUM 9.3   GFR: CrCl cannot be calculated (Unknown ideal weight.). Liver Function Tests:  Recent Labs Lab 12/10/15 1526  AST 19  ALT 19  ALKPHOS 67  BILITOT 0.8  PROT 6.4*  ALBUMIN 3.4*   No results for input(s): LIPASE, AMYLASE in the last 168 hours. No results for input(s): AMMONIA in the last 168 hours. Coagulation Profile:  Recent Labs Lab 12/10/15 1526  INR 1.27   Cardiac Enzymes:  Recent Labs Lab 12/10/15 1526  TROPONINI <0.03   BNP (last 3 results) No results for input(s): PROBNP in the last 8760 hours. HbA1C: No results for input(s): HGBA1C in the last 72 hours. CBG: No results for input(s): GLUCAP in the last 168 hours. Lipid Profile: No results for input(s): CHOL, HDL, LDLCALC, TRIG, CHOLHDL, LDLDIRECT in the last 72 hours. Thyroid Function Tests: No results for input(s): TSH, T4TOTAL, FREET4, T3FREE, THYROIDAB in  the last 72 hours. Anemia Panel: No results for input(s): VITAMINB12, FOLATE, FERRITIN, TIBC, IRON, RETICCTPCT in the last 72 hours. Urine analysis:    Component Value Date/Time   COLORURINE ORANGE (A) 11/24/2015 1521   APPEARANCEUR TURBID (A) 11/24/2015 1521   LABSPEC 1.016 11/24/2015 1521   PHURINE 7.5 11/24/2015 1521   GLUCOSEU NEGATIVE 11/24/2015 1521   HGBUR LARGE (A) 11/24/2015 1521  BILIRUBINUR NEGATIVE 11/24/2015 1521   KETONESUR 15 (A) 11/24/2015 1521   PROTEINUR 100 (A) 11/24/2015 1521   UROBILINOGEN 1.0 11/23/2014 0249   NITRITE POSITIVE (A) 11/24/2015 1521   LEUKOCYTESUR LARGE (A) 11/24/2015 1521   Sepsis Labs: !!!!!!!!!!!!!!!!!!!!!!!!!!!!!!!!!!!!!!!!!!!! @LABRCNTIP (procalcitonin:4,lacticidven:4) )No results found for this or any previous visit (from the past 240 hour(s)).   Radiological Exams on Admission: Dg Chest Portable 1 View  Result Date: 12/10/2015 CLINICAL DATA:  Nausea and shortness of breath for the last 2 days. Weakness. EXAM: PORTABLE CHEST 1 VIEW COMPARISON:  09/23/2015 FINDINGS: Severe emphysema with biapical scarring and postoperative the wedge resection clips at the right lung apex. The degree of apical volume loss/ scarring appears mildly increased from the prior exam although some of this may be positional. Extensive kyphosis. Atherosclerotic aortic arch and tortuosity. Newly indistinct hemidiaphragms partially from projection but also potentially from bibasilar airspace opacities. Heart size felt to be within normal limits. Scattered scarring in the lungs. Chronic deformity from old fracture the right proximal humerus, healed. Bilateral airway thickening noted. IMPRESSION: 1. Bibasilar airspace opacities potentially from atelectasis or pneumonia. 2. Extensive biapical pleuroparenchymal scarring with some postoperative findings at the right lung apex. 3. Atherosclerotic aortic arch. 4. Severe emphysema. 5. Increased bilateral airway thickening suggesting  inflammation/bronchitis. Electronically Signed   By: Van Clines M.D.   On: 12/10/2015 15:09    Reviewed chest xray film independently.   EKG: Independently reviewed.   Assessment/Plan Principal Problem:   Acute respiratory failure with hypoxia and hypercapnia (HCC) Active Problems:   COPD with acute exacerbation (HCC)   Atrial fibrillation with RVR (HCC)   Acute diastolic heart failure (HCC)   Dysuria   HLD (hyperlipidemia)   Depression   GERD (gastroesophageal reflux disease)  Acute on chronic hypoxemic and hypercapnic respiratory failure  -Multifactorial: COPD exacerbation, acute CHF, A Fib RVR -Gross O2 to maintain sat 88-92%   A fib RVR -Diltiazem gtt -Pradaxa (recently switched from xarelto to pradaxa)  -Hold beta blocker in setting of acute CHF and acute COPD exacerbation  -Tele  -CHADSVASc 3  COPD exacerbation -Spiriva  -Xopenex q6h prn  / Atrovent q6h prn  -Solumedrol  -Azithromax  -Hold beta blocker in setting of acute CHF and acute COPD exacerbation   Acute on chronic diastolic heart failure -Echo in 04/2014 with LVH, EF 65-70% -BNP 735.3 on admission  -Strict I/Os, daily weights  -Lasix 40mg  IV daily -Obtain Echo   Dysuria -Recent UTI tx with Keflex, still symptomatic -Repeat UA   HLD -Lipitor  Depression -Zoloft   GERD -PPI   DVT prophylaxis: anticoagulated  Code Status: full  Family Communication: no family at bedside Disposition Plan: admit to inpatient for further management of A Fib RVR, COPD exacerbation and respiratory failure. I anticipate that patient will require > 2 midnights for stabilization and work up.  Consults called: None Admission status: inpatient, SDU   Dessa Phi, DO [facility] Pager [phone]  If 7PM-7AM, please contact night-coverage [email or web address] Password [facility] 12/10/2015, 6:19 PM

## 2015-12-10 NOTE — ED Triage Notes (Signed)
Pt. Has been complaining of nausea and SOB for the last 2 days with weakness. Pt. Did and albuterol treatment at home and normally wears 3L O2 and came in with 6L because she said that the 3 just wasn't enough. A/Ox4 and able to talk

## 2015-12-11 LAB — CBC
HCT: 41.6 % (ref 36.0–46.0)
Hemoglobin: 12.1 g/dL (ref 12.0–15.0)
MCH: 26.2 pg (ref 26.0–34.0)
MCHC: 29.1 g/dL — ABNORMAL LOW (ref 30.0–36.0)
MCV: 90.2 fL (ref 78.0–100.0)
PLATELETS: 147 10*3/uL — AB (ref 150–400)
RBC: 4.61 MIL/uL (ref 3.87–5.11)
RDW: 16.4 % — ABNORMAL HIGH (ref 11.5–15.5)
WBC: 5.1 10*3/uL (ref 4.0–10.5)

## 2015-12-11 LAB — BASIC METABOLIC PANEL
Anion gap: 7 (ref 5–15)
BUN: 16 mg/dL (ref 6–20)
CHLORIDE: 93 mmol/L — AB (ref 101–111)
CO2: 36 mmol/L — AB (ref 22–32)
CREATININE: 0.89 mg/dL (ref 0.44–1.00)
Calcium: 9.4 mg/dL (ref 8.9–10.3)
GFR calc non Af Amer: >60 mL/min (ref >60)
GLUCOSE: 170 mg/dL — AB (ref 65–99)
Potassium: 5.7 mmol/L — ABNORMAL HIGH (ref 3.5–5.1)
Sodium: 136 mmol/L (ref 135–145)

## 2015-12-11 LAB — URINALYSIS, ROUTINE W REFLEX MICROSCOPIC
BILIRUBIN URINE: NEGATIVE
GLUCOSE, UA: NEGATIVE mg/dL
Ketones, ur: 15 mg/dL — AB
Nitrite: NEGATIVE
PH: 6 (ref 5.0–8.0)
Protein, ur: NEGATIVE mg/dL
SPECIFIC GRAVITY, URINE: 1.017 (ref 1.005–1.030)

## 2015-12-11 LAB — URINE MICROSCOPIC-ADD ON

## 2015-12-11 MED ORDER — DEXTROSE 5 % IV SOLN
1.0000 g | INTRAVENOUS | Status: DC
  Administered 2015-12-11 – 2015-12-16 (×6): 1 g via INTRAVENOUS
  Filled 2015-12-11 (×7): qty 10

## 2015-12-11 MED ORDER — SENNOSIDES-DOCUSATE SODIUM 8.6-50 MG PO TABS: 2.0000 | ORAL_TABLET | Freq: Every evening | ORAL | Status: DC | PRN

## 2015-12-11 MED ORDER — METOPROLOL TARTRATE 12.5 MG HALF TABLET: 12.5000 mg | ORAL_TABLET | Freq: Two times a day (BID) | ORAL | Status: DC

## 2015-12-11 MED ORDER — POLYETHYLENE GLYCOL 3350 17 G PO PACK
17.0000 g | PACK | Freq: Every day | ORAL | Status: DC | PRN
  Administered 2015-12-12: 17 g via ORAL
  Filled 2015-12-11: qty 1

## 2015-12-11 MED ORDER — FUROSEMIDE 10 MG/ML IJ SOLN
40.0000 mg | Freq: Two times a day (BID) | INTRAMUSCULAR | Status: DC
Start: 2015-12-11 — End: 2015-12-12
  Administered 2015-12-11 – 2015-12-12 (×2): 40 mg via INTRAVENOUS
  Filled 2015-12-11 (×2): qty 4

## 2015-12-11 MED ORDER — GUAIFENESIN ER 600 MG PO TB12
1200.0000 mg | ORAL_TABLET | Freq: Two times a day (BID) | ORAL | Status: DC
  Administered 2015-12-11 – 2015-12-16 (×11): 1200 mg via ORAL
  Filled 2015-12-11 (×11): qty 2

## 2015-12-11 MED ORDER — METOPROLOL TARTRATE 25 MG PO TABS
25.0000 mg | ORAL_TABLET | Freq: Two times a day (BID) | ORAL | Status: DC
  Administered 2015-12-11 – 2015-12-12 (×3): 25 mg via ORAL
  Filled 2015-12-11 (×3): qty 1

## 2015-12-11 NOTE — Consult Note (Signed)
   [facility] Inpatient Consult   12/11/2015  [name] [name] 05-13-1940 [id number]   Patient was assessed for [place] [facility] for community services. Patient was previously active with [place] [facility] with RN Care Manager Coordinator, Pharmacist, and social worker. Chart review reveals that the patient is a 75 y.o.femalewith medical history significant of chronic respiratory failure, oxygen requiring at baseline, COPD, paroxysmal A. fib, history of right-sided pneumothorax 3 required tube thoracostomy, coronary artery disease, hypertension who presents with complaint of shortness of breath. She states that over the past 2 days prior to admission, she has been short of breath and has also noticed heart palpitations starting at night. She denies any chest discomfort. She admits to cough with white sputum production. She denies any fevers, chills, rigors at home. She states that she had some nausea and vomiting last night as well but denies any symptoms at this time. She denies any abdominal discomfort but states that she has some burning with urination. Also admits to worsening peripheral edema.    Met with patient at bedside regarding being restarted with [facility] services. Consent form active on file and a brochure with [facility] [facility] information given. Patient states she has help 5 days a week with personal care assistance with ADLs and meals.  Her son assist her on the weekends.  [name] will follow up for calls and assess for home visits.    Of note, [facility] Care Management services does not replace or interfere with any services that are arranged by inpatient case management or social work. For additional questions or referrals please contact:  Natividad Brood, RN BSN [facility] Liaison  [phone] business mobile phone Toll free office [phone]

## 2015-12-11 NOTE — Progress Notes (Signed)
PROGRESS NOTE  [name] [name] [name]  [id number] DOB: Jan 09, 1941 DOA: 12/10/2015 PCP: Leonides Sake, MD Outpatient Specialists:  Subjective: Patient seen while she was eating her breakfast, denies any new complaints. Heart rate is still in the 120s  Brief Narrative:  [name] [name] [name] is [name] [name] 75 y.o. female with medical history significant of chronic respiratory failure, oxygen requiring at baseline, COPD, paroxysmal [name] [name]. fib, history of right-sided pneumothorax 3 required tube thoracostomy, coronary artery disease, hypertension who presents with complaint of shortness of breath. She states that over the past 2 days, she has been short of breath and has also noticed heart palpitations starting last night. She denies any chest discomfort. She admits to cough with white sputum production. She denies any fevers, chills, rigors at home. She states that she had some nausea and vomiting last night as well but denies any symptoms at this time. She denies any abdominal discomfort but states that she has some burning with urination. Also admits to worsening peripheral edema.   Assessment & Plan:   Principal Problem:   Acute respiratory failure with hypoxia and hypercapnia (HCC) Active Problems:   COPD with acute exacerbation (HCC)   Atrial fibrillation with RVR (HCC)   Acute diastolic heart failure (HCC)   Dysuria   HLD (hyperlipidemia)   Depression   GERD (gastroesophageal reflux disease)   Acute on chronic hypoxemic and hypercapnic respiratory failure  -Multifactorial: COPD exacerbation, acute CHF, [name] [name] Fib RVR -[place] O2 to maintain sat 88-92%, try to wean oxygen today. -Patient was on 6 L of oxygen yesterday. Improving.   [name] [name] fib RVR -Continue Cardizem drip, heart rate is still 120s. -Pradaxa (recently switched from xarelto to pradaxa)  -Initially beta blockers held, I will restart. Continue telemetry monitoring.  COPD exacerbation -Presented with wheezing, shortness of breath and minimal  sputum/cough. -Started on IV antibiotics and IV steroids. -Continue supportive management with bronchodilators, mucolytics, antitussives and oxygen.  Acute on chronic diastolic heart failure -Echo in 04/2014 with LVH, EF 65-70% -BNP 735.3 on admission  -Strict I/Os, daily weights  -Still has significant bilateral lower extremity edema, I will increase Lasix to 40 mg IV twice [name] [name] day.  UTI -Has had recent UTI, treated with Keflex, UA still showing TNTC pus cells. -Rocephin.  HLD -Lipitor  Depression -Zoloft   GERD -PPI   DVT prophylaxis: Pradaxa Code Status: Full Code Family Communication:  Disposition Plan:  Diet: Diet Heart Room service appropriate? Yes; Fluid consistency: Thin; Fluid restriction: 2000 mL Fluid  Consultants:   [name] [name] [name]  Procedures:   None  Antimicrobials:   None   Objective: Vitals:   12/11/15 0034 12/11/15 0103 12/11/15 0500 12/11/15 0933  BP: 112/65 126/68 139/73 (!) 150/97  Pulse: 67 (!) 47 88 82  Resp: 16 15 14  (!) 22  Temp: 97.6 F (36.4 C)  98 F (36.7 C)   TempSrc: Oral  Oral   SpO2: 100% 100% 100% 99%  Weight:   59 kg (130 lb 1.6 oz)   Height:        Intake/Output Summary (Last 24 hours) at 12/11/15 1049 Last data filed at 12/11/15 1003  Gross per 24 hour  Intake           584.54 ml  Output              240 ml  Net           344.54 ml   Filed Weights   12/10/15 1903 12/11/15 0500  Weight: 58.9 kg (129 lb  12.8 oz) 59 kg (130 lb 1.6 oz)    Examination: General exam: Appears calm and comfortable  Respiratory system: Clear to auscultation. Respiratory effort normal. Cardiovascular system: S1 & S2 heard, RRR. No JVD, murmurs, rubs, gallops or clicks. No pedal edema. Gastrointestinal system: Abdomen is nondistended, soft and nontender. No organomegaly or masses felt. Normal bowel sounds heard. Central nervous system: Alert and oriented. No focal neurological deficits. Extremities: Symmetric 5 x 5 power. Skin: No rashes,  lesions or ulcers Psychiatry: Judgement and insight appear normal. Mood & affect appropriate.   Data Reviewed: I have personally reviewed following labs and imaging studies  CBC:  Recent Labs Lab 12/10/15 1526 12/11/15 0353  WBC 9.6 5.1  NEUTROABS 7.9*  --   HGB 11.8* 12.1  HCT 40.8 41.6  MCV 90.9 90.2  PLT 142* [id number]*   Basic Metabolic Panel:  Recent Labs Lab 12/10/15 1526 12/11/15 0353  NA 135 136  K 4.3 5.7*  CL 92* 93*  CO2 32 36*  GLUCOSE 109* 170*  BUN 11 16  CREATININE 0.70 0.89  CALCIUM 9.3 9.4   GFR: Estimated Creatinine Clearance: 45.8 mL/min (by C-G formula based on SCr of 0.89 mg/dL). Liver Function Tests:  Recent Labs Lab 12/10/15 1526  AST 19  ALT 19  ALKPHOS 67  BILITOT 0.8  PROT 6.4*  ALBUMIN 3.4*   No results for input(s): LIPASE, AMYLASE in the last 168 hours. No results for input(s): AMMONIA in the last 168 hours. Coagulation Profile:  Recent Labs Lab 12/10/15 1526  INR 1.27   Cardiac Enzymes:  Recent Labs Lab 12/10/15 1526  TROPONINI <0.03   BNP (last 3 results) No results for input(s): PROBNP in the last 8760 hours. HbA1C: No results for input(s): HGBA1C in the last 72 hours. CBG: No results for input(s): GLUCAP in the last 168 hours. Lipid Profile: No results for input(s): CHOL, HDL, LDLCALC, TRIG, CHOLHDL, LDLDIRECT in the last 72 hours. Thyroid Function Tests: No results for input(s): TSH, T4TOTAL, FREET4, T3FREE, THYROIDAB in the last 72 hours. Anemia Panel: No results for input(s): VITAMINB12, FOLATE, FERRITIN, TIBC, IRON, RETICCTPCT in the last 72 hours. Urine analysis:    Component Value Date/Time   COLORURINE YELLOW 12/11/2015 0652   APPEARANCEUR CLOUDY ([name] [name]) 12/11/2015 0652   LABSPEC 1.017 12/11/2015 0652   PHURINE 6.0 12/11/2015 0652   GLUCOSEU NEGATIVE 12/11/2015 0652   HGBUR LARGE ([name] [name]) 12/11/2015 0652   BILIRUBINUR NEGATIVE 12/11/2015 0652   KETONESUR 15 ([name] [name]) 12/11/2015 0652   PROTEINUR NEGATIVE  12/11/2015 0652   UROBILINOGEN 1.0 11/23/2014 0249   NITRITE NEGATIVE 12/11/2015 0652   LEUKOCYTESUR LARGE ([name] [name]) 12/11/2015 0652   Sepsis Labs: @LABRCNTIP (procalcitonin:4,lacticidven:4)  )No results found for this or any previous visit (from the past 240 hour(s)).   Invalid input(s): PROCALCITONIN, LACTICACIDVEN   Radiology Studies: Dg Chest Portable 1 View  Result Date: 12/10/2015 CLINICAL DATA:  Nausea and shortness of breath for the last 2 days. Weakness. EXAM: PORTABLE CHEST 1 VIEW COMPARISON:  09/23/2015 FINDINGS: Severe emphysema with biapical scarring and postoperative the wedge resection clips at the right lung apex. The degree of apical volume loss/ scarring appears mildly increased from the prior exam although some of this may be positional. Extensive kyphosis. Atherosclerotic aortic arch and tortuosity. Newly indistinct hemidiaphragms partially from projection but also potentially from bibasilar airspace opacities. Heart size felt to be within normal limits. Scattered scarring in the lungs. Chronic deformity from old fracture the right proximal humerus, healed. Bilateral airway thickening noted. IMPRESSION:  1. Bibasilar airspace opacities potentially from atelectasis or pneumonia. 2. Extensive biapical pleuroparenchymal scarring with some postoperative findings at the right lung apex. 3. Atherosclerotic aortic arch. 4. Severe emphysema. 5. Increased bilateral airway thickening suggesting inflammation/bronchitis. Electronically Signed   By: Van Clines M.D.   On: 12/10/2015 15:09        Scheduled Meds: . atorvastatin  40 mg Oral q morning - 10a  . azithromycin  500 mg Oral Daily  . dabigatran  150 mg Oral BID  . diltiazem  10 mg Intravenous Once  . docusate sodium  100 mg Oral Q12H  . ferrous sulfate  325 mg Oral Q breakfast  . furosemide  40 mg Intravenous Daily  . methylPREDNISolone (SOLU-MEDROL) injection  80 mg Intravenous Q12H  . oxybutynin  10 mg Oral QHS  .  pantoprazole  40 mg Oral Daily  . sertraline  25 mg Oral QHS  . sodium chloride flush  3 mL Intravenous Q12H  . tiotropium  18 mcg Inhalation Daily   Continuous Infusions: . diltiazem (CARDIZEM) infusion 15 mg/hr (12/11/15 1030)     LOS: 1 day    Time spent: 35 minutes    [name] Lightsey A, MD [facility] Pager [phone]  If 7PM-7AM, please contact night-coverage [email or web address] Password [facility] 12/11/2015, 10:49 AM

## 2015-12-12 ENCOUNTER — Inpatient Hospital Stay (HOSPITAL_COMMUNITY): Payer: Commercial Managed Care - HMO

## 2015-12-12 ENCOUNTER — Ambulatory Visit (HOSPITAL_COMMUNITY): Payer: Commercial Managed Care - HMO

## 2015-12-12 DIAGNOSIS — R0600 Dyspnea, unspecified: Secondary | ICD-10-CM

## 2015-12-12 LAB — BASIC METABOLIC PANEL
ANION GAP: 10 (ref 5–15)
BUN: 27 mg/dL — ABNORMAL HIGH (ref 6–20)
CALCIUM: 8.8 mg/dL — AB (ref 8.9–10.3)
CO2: 32 mmol/L (ref 22–32)
Chloride: 90 mmol/L — ABNORMAL LOW (ref 101–111)
Creatinine, Ser: 1.09 mg/dL — ABNORMAL HIGH (ref 0.44–1.00)
GFR calc Af Amer: 57 mL/min — ABNORMAL LOW (ref >60)
GFR calc non Af Amer: 49 mL/min — ABNORMAL LOW (ref >60)
GLUCOSE: 154 mg/dL — AB (ref 65–99)
Potassium: 4.9 mmol/L (ref 3.5–5.1)
Sodium: 132 mmol/L — ABNORMAL LOW (ref 135–145)

## 2015-12-12 LAB — ECHOCARDIOGRAM COMPLETE
FS: 30 % (ref 28–44)
HEIGHTINCHES: 61 in
IVS/LV PW RATIO, ED: 0.86
LA ID, A-P, ES: 41 mm
LA diam end sys: 41 mm
LA vol A4C: 40.4 ml
LA vol index: 30.4 mL/m2
LADIAMINDEX: 2.59 cm/m2
LAVOL: 48 mL
LV PW d: 11.2 mm — AB (ref 0.6–1.1)
LVOT area: 3.14 cm2
LVOT diameter: 20 mm
Reg peak vel: 264 cm/s
TR max vel: 264 cm/s
WEIGHTICAEL: 2104 [oz_av]

## 2015-12-12 LAB — CBC
HCT: 40.7 % (ref 36.0–46.0)
HEMOGLOBIN: 11.9 g/dL — AB (ref 12.0–15.0)
MCH: 26.1 pg (ref 26.0–34.0)
MCHC: 29.2 g/dL — AB (ref 30.0–36.0)
MCV: 89.3 fL (ref 78.0–100.0)
Platelets: 156 10*3/uL (ref 150–400)
RBC: 4.56 MIL/uL (ref 3.87–5.11)
RDW: 16.4 % — ABNORMAL HIGH (ref 11.5–15.5)
WBC: 20.9 10*3/uL — ABNORMAL HIGH (ref 4.0–10.5)

## 2015-12-12 LAB — TSH: TSH: 1.444 u[IU]/mL (ref 0.350–4.500)

## 2015-12-12 MED ORDER — BISACODYL 10 MG RE SUPP
10.0000 mg | Freq: Once | RECTAL | Status: AC
Start: 2015-12-12 — End: 2015-12-12
  Administered 2015-12-12: 10 mg via RECTAL
  Filled 2015-12-12: qty 1

## 2015-12-12 MED ORDER — DIGOXIN 0.25 MG/ML IJ SOLN
0.2500 mg | Freq: Every day | INTRAMUSCULAR | Status: AC
  Administered 2015-12-12 – 2015-12-13 (×2): 0.25 mg via INTRAVENOUS
  Filled 2015-12-12 (×2): qty 2

## 2015-12-12 MED ORDER — METOPROLOL TARTRATE 5 MG/5ML IV SOLN
2.5000 mg | Freq: Once | INTRAVENOUS | Status: AC
  Administered 2015-12-12: 2.5 mg via INTRAVENOUS
  Filled 2015-12-12: qty 5

## 2015-12-12 MED ORDER — FUROSEMIDE 10 MG/ML IJ SOLN
40.0000 mg | Freq: Every day | INTRAMUSCULAR | Status: DC
  Administered 2015-12-13 – 2015-12-16 (×4): 40 mg via INTRAVENOUS
  Filled 2015-12-12 (×4): qty 4

## 2015-12-12 MED ORDER — DILTIAZEM HCL 60 MG PO TABS
60.0000 mg | ORAL_TABLET | Freq: Four times a day (QID) | ORAL | Status: DC
  Administered 2015-12-12: 60 mg via ORAL
  Filled 2015-12-12: qty 1

## 2015-12-12 MED ORDER — DILTIAZEM HCL 30 MG PO TABS
30.0000 mg | ORAL_TABLET | Freq: Four times a day (QID) | ORAL | Status: DC
  Administered 2015-12-12 (×2): 30 mg via ORAL
  Filled 2015-12-12 (×2): qty 1

## 2015-12-12 MED ORDER — METOPROLOL TARTRATE 50 MG PO TABS
50.0000 mg | ORAL_TABLET | Freq: Two times a day (BID) | ORAL | Status: DC
  Administered 2015-12-12 – 2015-12-16 (×8): 50 mg via ORAL
  Filled 2015-12-12 (×8): qty 1

## 2015-12-12 MED ORDER — DILTIAZEM HCL 100 MG IV SOLR
10.0000 mg/h | INTRAVENOUS | Status: DC
  Administered 2015-12-12: 10 mg/h via INTRAVENOUS
  Filled 2015-12-12 (×3): qty 100

## 2015-12-12 NOTE — Consult Note (Signed)
Referring Physician:  [name] is an 75 y.o. female.                       Chief Complaint: Atrial fibrillation with rapid ventricular response.  HPI: 75 year old female with chronic respiratory failure and possible pneumonia/COPD exacerbation has atrial fibrillation with rapid ventricular response and acute diastolic heart failure. Patient denies chest pain but admits to recurrent dizziness. Diltiazem drip was discontinued last night as heart rate dropped in 60's.  Past Medical History:  Diagnosis Date  . Anemia   . Arthritis    "back" (11/21/2014)  . Asthma   . Blood dyscrasia    hx blood clotts  . Chronic bronchitis ([place])   . Chronic kidney disease   . Chronic lower back pain   . 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 GERD (gastroesophageal reflux disease)   . EYCXKGYJ(856.3)    "a few times/year" (11/21/2014)  . History of blood transfusion X 2   "related to blood thinner I was taking"  . Hyperlipidemia   . Hypertension   . Migraine    "weekly probably" (11/21/2014)  . On home oxygen therapy    "3L; 24/7" (11/21/2014)  . Pneumonia    hx pneumonia   . Pneumothorax on right 09/2014  . Pulmonary embolism ([place])   . Shortness of breath   . Urinary incontinence       Past Surgical History:  Procedure Laterality Date  . ABDOMINAL HYSTERECTOMY    . CATARACT EXTRACTION W/ INTRAOCULAR LENS  IMPLANT, BILATERAL Bilateral   . CYSTOSCOPY W/ RETROGRADES Bilateral 06/11/2014   Procedure: CYSTOSCOPY WITH RETROGRADE PYELOGRAM;  Surgeon: [name], MD;  Location: [facility];  Service: Urology;  Laterality: Bilateral;  . FRACTURE SURGERY    . HARDWARE REMOVAL  01/29/2012   Procedure: HARDWARE REMOVAL;  Surgeon: [name], MD;  Location: [place];  Service: Orthopedics;  Laterality: Right;  . HEMIARTHROPLASTY SHOULDER FRACTURE Right   . HIP ARTHROPLASTY  01/29/2012   Procedure: ARTHROPLASTY BIPOLAR HIP;  Surgeon: [name], MD;  Location: [place];   Service: Orthopedics;  Laterality: Right;  . LAPAROSCOPIC CHOLECYSTECTOMY    . ORIF WRIST FRACTURE  10/13/2011   Procedure: OPEN REDUCTION INTERNAL FIXATION (ORIF) WRIST FRACTURE;  Surgeon: [name], MD;  Location: [place];  Service: Orthopedics;  Laterality: Right;  Open Reduction Internal Fixation Right Distal Radius   . TRANSURETHRAL RESECTION OF BLADDER TUMOR N/A 06/11/2014   Procedure: TRANSURETHRAL RESECTION OF BLADDER TUMOR (TURBT);  Surgeon: [name], MD;  Location: [facility];  Service: Urology;  Laterality: N/A;  . TUBAL LIGATION      Family History  Problem Relation Age of Onset  . Cancer - Lung Mother   . Coronary artery disease Mother   . Coronary artery disease Sister   . Coronary artery disease Brother    Social History:  reports that she quit smoking about 21 months ago. Her smoking use included Cigarettes. She has a 56.00 pack-year smoking history. She has never used smokeless tobacco. She reports that she does not drink alcohol or use drugs.  Allergies:  Allergies  Allergen Reactions  . Eggs Or Egg-Derived Products Other (See Comments)    Dislikes eggs, every day. States no allergy    Medications Prior to Admission  Medication Sig Dispense Refill  . albuterol (PROVENTIL HFA;VENTOLIN HFA) 108 (90 Base) MCG/ACT inhaler Inhale 2 puffs into the  lungs 4 (four) times daily as needed for wheezing or shortness of breath.    Marland Kitchen albuterol (PROVENTIL) (2.5 MG/3ML) 0.083% nebulizer solution Take 2.5 mg by nebulization every 6 (six) hours as needed for wheezing or shortness of breath.     Marland Kitchen alendronate (FOSAMAX) 70 MG tablet Take 70 mg by mouth once a week. Take with a full glass of water on an empty stomach.    Marland Kitchen aspirin-sod bicarb-citric acid (ALKA-SELTZER) 325 MG TBEF tablet Take 325 mg by mouth every 6 (six) hours as needed (for stomach).    Marland Kitchen atorvastatin (LIPITOR) 40 MG tablet Take 40 mg by mouth every morning.     . calcitonin, salmon, (MIACALCIN/FORTICAL) 200 UNIT/ACT  nasal spray Place 1 spray into alternate nostrils daily.    . Cranberry-Vitamin C-Probiotic (AZO CRANBERRY PO) Take 2 capsules by mouth daily.    . cyanocobalamin 500 MCG tablet Take 500 mcg by mouth daily.    . dabigatran (PRADAXA) 150 MG CAPS capsule Take 150 mg by mouth 2 (two) times daily.    Marland Kitchen diltiazem (CARDIZEM CD) 240 MG 24 hr capsule Take 240 mg by mouth every morning.   1  . docusate sodium (COLACE) 100 MG capsule Take 1 capsule (100 mg total) by mouth every 12 (twelve) hours. 60 capsule 0  . ferrous sulfate 325 (65 FE) MG tablet Take 325 mg by mouth daily with breakfast.    . fluticasone (FLONASE) 50 MCG/ACT nasal spray Place 1 spray into both nostrils daily as needed for allergies or rhinitis.    . furosemide (LASIX) 20 MG tablet Take 1 tablet (20 mg total) by mouth daily. 30 tablet 1  . metoprolol succinate (TOPROL-XL) 25 MG 24 hr tablet Take 12.5 mg by mouth every morning.  3  . Omega-3 Fatty Acids (FISH OIL) 1000 MG CAPS Take 1,000 mg by mouth daily.     Marland Kitchen oxybutynin (DITROPAN-XL) 5 MG 24 hr tablet Take 10 mg by mouth at bedtime.    . pantoprazole (PROTONIX) 40 MG tablet Take 1 tablet (40 mg total) by mouth daily. 30 tablet 0  . predniSONE (DELTASONE) 20 MG tablet Take 2 tablets (40 mg total) by mouth daily with breakfast. 6 tablet 0  . sertraline (ZOLOFT) 25 MG tablet Take 1 tablet (25 mg total) by mouth at bedtime. 30 tablet 1  . sulfamethoxazole-trimethoprim (BACTRIM DS,SEPTRA DS) 800-160 MG tablet Take 1 tablet by mouth every 12 (twelve) hours. 9 tablet 0  . tiotropium (SPIRIVA) 18 MCG inhalation capsule Place 1 capsule (18 mcg total) into inhaler and inhale daily. 30 capsule 12  . tiZANidine (ZANAFLEX) 2 MG tablet Take 2 mg by mouth at bedtime.    . VENTOLIN HFA 108 (90 BASE) MCG/ACT inhaler Inhale 2 puffs into the lungs 4 (four) times daily as needed for wheezing or shortness of breath.     . cephALEXin (KEFLEX) 500 MG capsule Take 1 capsule (500 mg total) by mouth 2 (two)  times daily. (Patient not taking: Reported on 12/10/2015) 14 capsule 0  . HYDROcodone-acetaminophen (NORCO/VICODIN) 5-325 MG tablet Take 1 tablet by mouth every 4 (four) hours as needed. (Patient not taking: Reported on 12/10/2015) 20 tablet 0    Results for orders placed or performed during the hospital encounter of 12/10/15 (from the past 48 hour(s))  Basic metabolic panel     Status: Abnormal   Collection Time: 12/11/15  3:53 AM  Result Value Ref Range   Sodium 136 135 - 145 mmol/L   Potassium  5.7 (H) 3.5 - 5.1 mmol/L    Comment: DELTA CHECK NOTED NO VISIBLE HEMOLYSIS    Chloride 93 (L) 101 - 111 mmol/L   CO2 36 (H) 22 - 32 mmol/L   Glucose, Bld 170 (H) 65 - 99 mg/dL   BUN 16 6 - 20 mg/dL   Creatinine, Ser 0.89 0.44 - 1.00 mg/dL   Calcium 9.4 8.9 - 10.3 mg/dL   GFR calc non Af Amer >60 >60 mL/min   GFR calc Af Amer >60 >60 mL/min    Comment: (NOTE) The eGFR has been calculated using the CKD EPI equation. This calculation has not been validated in all clinical situations. eGFR's persistently <60 mL/min signify possible Chronic Kidney Disease.    Anion gap 7 5 - 15  CBC     Status: Abnormal   Collection Time: 12/11/15  3:53 AM  Result Value Ref Range   WBC 5.1 4.0 - 10.5 K/uL   RBC 4.61 3.87 - 5.11 MIL/uL   Hemoglobin 12.1 12.0 - 15.0 g/dL   HCT 41.6 36.0 - 46.0 %   MCV 90.2 78.0 - 100.0 fL   MCH 26.2 26.0 - 34.0 pg   MCHC 29.1 (L) 30.0 - 36.0 g/dL   RDW 16.4 (H) 11.5 - 15.5 %   Platelets 147 (L) 150 - 400 K/uL  Urinalysis, Routine w reflex microscopic (not at [facility])     Status: Abnormal   Collection Time: 12/11/15  6:52 AM  Result Value Ref Range   Color, Urine YELLOW YELLOW   APPearance CLOUDY (A) CLEAR   Specific Gravity, Urine 1.017 1.005 - 1.030   pH 6.0 5.0 - 8.0   Glucose, UA NEGATIVE NEGATIVE mg/dL   Hgb urine dipstick LARGE (A) NEGATIVE   Bilirubin Urine NEGATIVE NEGATIVE   Ketones, ur 15 (A) NEGATIVE mg/dL   Protein, ur NEGATIVE NEGATIVE mg/dL   Nitrite  NEGATIVE NEGATIVE   Leukocytes, UA LARGE (A) NEGATIVE  Urine microscopic-add on     Status: Abnormal   Collection Time: 12/11/15  6:52 AM  Result Value Ref Range   Squamous Epithelial / LPF 6-30 (A) NONE SEEN   WBC, UA TOO NUMEROUS TO COUNT 0 - 5 WBC/hpf   RBC / HPF 6-30 0 - 5 RBC/hpf   Bacteria, UA MANY (A) NONE SEEN   Urine-Other MUCOUS PRESENT   Culture, Urine     Status: Abnormal (Preliminary result)   Collection Time: 12/11/15  6:52 AM  Result Value Ref Range   Specimen Description URINE, RANDOM    Special Requests NONE    Culture (A)     >=100,000 COLONIES/mL KLEBSIELLA PNEUMONIAE SUSCEPTIBILITIES TO FOLLOW    Report Status PENDING   Basic metabolic panel     Status: Abnormal   Collection Time: 12/12/15  4:17 AM  Result Value Ref Range   Sodium 132 (L) 135 - 145 mmol/L   Potassium 4.9 3.5 - 5.1 mmol/L    Comment: SLIGHT HEMOLYSIS   Chloride 90 (L) 101 - 111 mmol/L   CO2 32 22 - 32 mmol/L   Glucose, Bld 154 (H) 65 - 99 mg/dL   BUN 27 (H) 6 - 20 mg/dL   Creatinine, Ser 1.09 (H) 0.44 - 1.00 mg/dL   Calcium 8.8 (L) 8.9 - 10.3 mg/dL   GFR calc non Af Amer 49 (L) >60 mL/min   GFR calc Af Amer 57 (L) >60 mL/min    Comment: (NOTE) The eGFR has been calculated using the CKD EPI equation. This calculation  has not been validated in all clinical situations. eGFR's persistently <60 mL/min signify possible Chronic Kidney Disease.    Anion gap 10 5 - 15  CBC     Status: Abnormal   Collection Time: 12/12/15  4:17 AM  Result Value Ref Range   WBC 20.9 (H) 4.0 - 10.5 K/uL   RBC 4.56 3.87 - 5.11 MIL/uL   Hemoglobin 11.9 (L) 12.0 - 15.0 g/dL   HCT 40.7 36.0 - 46.0 %   MCV 89.3 78.0 - 100.0 fL   MCH 26.1 26.0 - 34.0 pg   MCHC 29.2 (L) 30.0 - 36.0 g/dL   RDW 16.4 (H) 11.5 - 15.5 %   Platelets 156 150 - 400 K/uL   No results found.  Review Of Systems General: No Chills or fever. No night sweats, some loss of weight. Respiratory: Chronic cough, COPD, H/O  pneumonia. Cardiovascular: No chest pain. Positive exertional dyspnea, palpitations and dizziness. GI: Positive nausea, No GI bleed. GU: No Hematuria, positive dysuria and urinary incontinence. Neurology: Positive dizziness, No stroke or seizures. Positive migraine headaches.  Blood pressure 113/80, pulse (!) 126, temperature 98.6 F (37 C), temperature source Oral, resp. rate 16, height 5' 1" (1.549 m), weight 59.6 kg (131 lb 8 oz), SpO2 99 %. General appearance: alert, cooperative, appears older than stated age, cachectic and in moderate distress Head: Normocephalic, atraumatic Eyes: Conjunctivae pink, corneas clear. PERRL, EOM's intact.  Neck: No adenopathy, no carotid bruit, no JVD, supple, symmetrical, trachea midline and thyroid not enlarged. Resp: Clear to auscultation bilaterally but decreased breath sounds lower half of lung fields. Cardio: Irregular rate and rhythm, S1, S2 normal, II/VI systolic murmur. No click, rub or gallop GI: soft, non-tender; bowel sounds normal; no masses, no organomegaly. Extremities: 2 + edema, atraumatic, no cyanosis. Skin: Warm and dry. Neurologic: Alert and oriented X 3, normal strength and tone. Moves all 4 extremities with some arthritic changes of both hands.  Assessment/Plan Atrial fibrillation with RVR, CHA2DS2VASc score of 4 COPD exacerbation, acute  Hypertension Hyperlipidemia H/O tobacco use disorder R/O hyperthyroidism  Increase dose of oral diltiazem and metoprolol as tolerated. Check TSH. Consider amiodarone use if needed.   Birdie Riddle, MD  12/12/2015, 5:42 PM

## 2015-12-12 NOTE — Care Management Note (Addendum)
Case Management Note  Patient Details  Name: [name] [name] MRN: [id number] Date of Birth: Aug 11, 1940  Subjective/Objective: Pt presented for Acute Respiratory Failure. Pt is from home with the support of caregivers 5 days a week. Pt was previously active with [facility] for RN/PT Services. Pt discussed in progression and pt will need PT/OT consult for recommendations. If the plan will be for home pt will need resumption orders for [facility].                   Action/Plan: CM will continue to monitor for additional needs.   Expected Discharge Date:                  Expected Discharge Plan:  [place]  In-House Referral:  NA  Discharge planning Services  CM Consult  Post Acute Care Choice:  Home Health, Resumption of Svcs/PTA Provider Choice offered to:  Patient  DME Arranged:    DME Agency:     HH Arranged:    [place] Agency:     Status of Service:     If discussed at [employer], dates discussed:    Additional Comments: 1416 12-16-15 Jacqlyn Krauss, RN,BSN [phone] Pt plan for d/c to [facility]. [name] assisting with disposition needs. No further needs from CM at this time.  Bethena Roys, RN 12/12/2015, 3:53 PM

## 2015-12-12 NOTE — Progress Notes (Addendum)
PROGRESS NOTE  [name]  [id number] DOB: [birth year] DOA: 12/10/2015 PCP: Leonides Sake, MD Outpatient Specialists:  Subjective: She still feels has some SOB and labored breathing. Taken off of the Cardizem drip after heart rate went down to the upper 40s.  Brief Narrative:  [name] [name] is a 75 y.o. female with medical history significant of chronic respiratory failure, oxygen requiring at baseline, COPD, paroxysmal A. fib, history of right-sided pneumothorax 3 required tube thoracostomy, coronary artery disease, hypertension who presents with complaint of shortness of breath. She states that over the past 2 days, she has been short of breath and has also noticed heart palpitations starting last night. She denies any chest discomfort. She admits to cough with white sputum production. She denies any fevers, chills, rigors at home. She states that she had some nausea and vomiting last night as well but denies any symptoms at this time. She denies any abdominal discomfort but states that she has some burning with urination. Also admits to worsening peripheral edema.   Assessment & Plan:   Principal Problem:   Acute respiratory failure with hypoxia and hypercapnia (HCC) Active Problems:   COPD with acute exacerbation (HCC)   Atrial fibrillation with RVR (HCC)   Acute diastolic heart failure (HCC)   Dysuria   HLD (hyperlipidemia)   Depression   GERD (gastroesophageal reflux disease)   Acute on chronic hypoxemic and hypercapnic respiratory failure  -Multifactorial: COPD exacerbation, acute CHF, A Fib RVR -[place] O2 to maintain sat 88-92%, try to wean oxygen today. -Patient was on 6 L of oxygen, wean down to room air as tolerated.  A fib RVR -Initially was on Cardizem drip, heart rate was in 120s to 104 hours, developed bradycardia with a heart rate went down to 40s. -Pradaxa (recently switched from xarelto to pradaxa)  -Lopressor started yesterday and 25 mg twice a  day. -Taken off of the Cardizem drip by night coverage, back on A. fib with RVR, I will ask cardiology to see.  COPD exacerbation -Presented with wheezing, shortness of breath and minimal sputum/cough. -Started on IV antibiotics and IV steroids. -Continue supportive management with bronchodilators, mucolytics, antitussives and oxygen. -Continue current respiratory regimen.  Acute on chronic diastolic heart failure -Echo in 04/2014 with LVH, EF 65-70%. -BNP 735.3 on admission, still has significant lower extremity edema.  -Strict I/Os, daily weights  -Received IV Lasix twice a day yesterday, there is increase in her BUN and creatinine, will go back to once a day.  UTI -Has had recent UTI, treated with Keflex, UA still showing TNTC pus cells. -Currently on Rocephin, follow cultures.  HLD -Lipitor  Depression -Zoloft   GERD -PPI   DVT prophylaxis: Pradaxa Code Status: Full Code Family Communication:  Disposition Plan:  Diet: Diet Heart Room service appropriate? Yes; Fluid consistency: Thin; Fluid restriction: 1500 mL Fluid  Consultants:   Cardiology  Procedures:   None  Antimicrobials:   Rocephin  Objective: Vitals:   12/12/15 0535 12/12/15 0544 12/12/15 0820 12/12/15 0918  BP: 110/89 110/89 113/80   Pulse:   (!) 126   Resp:      Temp:   97.8 F (36.6 C)   TempSrc:   Oral   SpO2: 100%  100% 99%  Weight:      Height:        Intake/Output Summary (Last 24 hours) at 12/12/15 1056 Last data filed at 12/12/15 0031  Gross per 24 hour  Intake  495.21 ml  Output                0 ml  Net           495.21 ml   Filed Weights   12/10/15 1903 12/11/15 0500 12/12/15 0350  Weight: 58.9 kg (129 lb 12.8 oz) 59 kg (130 lb 1.6 oz) 59.6 kg (131 lb 8 oz)    Examination: General exam: Appears calm and comfortable  Respiratory system: Clear to auscultation. Respiratory effort normal. Cardiovascular system: S1 & S2 heard, RRR. No JVD, murmurs, rubs,  gallops or clicks. No pedal edema. Gastrointestinal system: Abdomen is nondistended, soft and nontender. No organomegaly or masses felt. Normal bowel sounds heard. Central nervous system: Alert and oriented. No focal neurological deficits. Extremities: Symmetric 5 x 5 power. Skin: No rashes, lesions or ulcers Psychiatry: Judgement and insight appear normal. Mood & affect appropriate.   Data Reviewed: I have personally reviewed following labs and imaging studies  CBC:  Recent Labs Lab 12/10/15 1526 12/11/15 0353 12/12/15 0417  WBC 9.6 5.1 20.9*  NEUTROABS 7.9*  --   --   HGB 11.8* 12.1 11.9*  HCT 40.8 41.6 40.7  MCV 90.9 90.2 89.3  PLT 142* 147* [id number]   Basic Metabolic Panel:  Recent Labs Lab 12/10/15 1526 12/11/15 0353 12/12/15 0417  NA 135 136 132*  K 4.3 5.7* 4.9  CL 92* 93* 90*  CO2 32 36* 32  GLUCOSE 109* 170* 154*  BUN 11 16 27*  CREATININE 0.70 0.89 1.09*  CALCIUM 9.3 9.4 8.8*   GFR: Estimated Creatinine Clearance: 37.5 mL/min (by C-G formula based on SCr of 1.09 mg/dL (H)). Liver Function Tests:  Recent Labs Lab 12/10/15 1526  AST 19  ALT 19  ALKPHOS 67  BILITOT 0.8  PROT 6.4*  ALBUMIN 3.4*   No results for input(s): LIPASE, AMYLASE in the last 168 hours. No results for input(s): AMMONIA in the last 168 hours. Coagulation Profile:  Recent Labs Lab 12/10/15 1526  INR 1.27   Cardiac Enzymes:  Recent Labs Lab 12/10/15 1526  TROPONINI <0.03   BNP (last 3 results) No results for input(s): PROBNP in the last 8760 hours. HbA1C: No results for input(s): HGBA1C in the last 72 hours. CBG: No results for input(s): GLUCAP in the last 168 hours. Lipid Profile: No results for input(s): CHOL, HDL, LDLCALC, TRIG, CHOLHDL, LDLDIRECT in the last 72 hours. Thyroid Function Tests: No results for input(s): TSH, T4TOTAL, FREET4, T3FREE, THYROIDAB in the last 72 hours. Anemia Panel: No results for input(s): VITAMINB12, FOLATE, FERRITIN, TIBC, IRON,  RETICCTPCT in the last 72 hours. Urine analysis:    Component Value Date/Time   COLORURINE YELLOW 12/11/2015 0652   APPEARANCEUR CLOUDY (A) 12/11/2015 0652   LABSPEC 1.017 12/11/2015 0652   PHURINE 6.0 12/11/2015 0652   GLUCOSEU NEGATIVE 12/11/2015 0652   HGBUR LARGE (A) 12/11/2015 0652   BILIRUBINUR NEGATIVE 12/11/2015 0652   KETONESUR 15 (A) 12/11/2015 0652   PROTEINUR NEGATIVE 12/11/2015 0652   UROBILINOGEN 1.0 11/23/2014 0249   NITRITE NEGATIVE 12/11/2015 0652   LEUKOCYTESUR LARGE (A) 12/11/2015 0652   Sepsis Labs: @LABRCNTIP (procalcitonin:4,lacticidven:4)  )No results found for this or any previous visit (from the past 240 hour(s)).   Invalid input(s): PROCALCITONIN, LACTICACIDVEN   Radiology Studies: Dg Chest Portable 1 View  Result Date: 12/10/2015 CLINICAL DATA:  Nausea and shortness of breath for the last 2 days. Weakness. EXAM: PORTABLE CHEST 1 VIEW COMPARISON:  09/23/2015 FINDINGS: Severe emphysema with biapical scarring  and postoperative the wedge resection clips at the right lung apex. The degree of apical volume loss/ scarring appears mildly increased from the prior exam although some of this may be positional. Extensive kyphosis. Atherosclerotic aortic arch and tortuosity. Newly indistinct hemidiaphragms partially from projection but also potentially from bibasilar airspace opacities. Heart size felt to be within normal limits. Scattered scarring in the lungs. Chronic deformity from old fracture the right proximal humerus, healed. Bilateral airway thickening noted. IMPRESSION: 1. Bibasilar airspace opacities potentially from atelectasis or pneumonia. 2. Extensive biapical pleuroparenchymal scarring with some postoperative findings at the right lung apex. 3. Atherosclerotic aortic arch. 4. Severe emphysema. 5. Increased bilateral airway thickening suggesting inflammation/bronchitis. Electronically Signed   By: Van Clines M.D.   On: 12/10/2015 15:09         Scheduled Meds: . atorvastatin  40 mg Oral q morning - 10a  . azithromycin  500 mg Oral Daily  . cefTRIAXone (ROCEPHIN)  IV  1 g Intravenous Q24H  . dabigatran  150 mg Oral BID  . diltiazem  30 mg Oral Q6H  . docusate sodium  100 mg Oral Q12H  . ferrous sulfate  325 mg Oral Q breakfast  . furosemide  40 mg Intravenous BID  . guaiFENesin  1,200 mg Oral BID  . methylPREDNISolone (SOLU-MEDROL) injection  80 mg Intravenous Q12H  . metoprolol tartrate  25 mg Oral BID  . oxybutynin  10 mg Oral QHS  . pantoprazole  40 mg Oral Daily  . sertraline  25 mg Oral QHS  . sodium chloride flush  3 mL Intravenous Q12H  . tiotropium  18 mcg Inhalation Daily   Continuous Infusions:     LOS: 2 days    Time spent: 35 minutes    Atalya Dano A, MD [facility] Pager [phone]  If 7PM-7AM, please contact night-coverage [email or web address] Password [facility] 12/12/2015, 10:56 AM

## 2015-12-12 NOTE — Progress Notes (Addendum)
The clients heart rate dropped into the upper 40's a few times and is still in atrial fibrillation. The strips are saved in epic and hard copies have been placed in the chart for viewing. I stopped the Cardizem drip and notified Kirby with [facility]. No new orders placed at this time. I will continue to monitor the client closely.   Saddie Benders RN  Addendum:  Orders placed to discontinue the Cardizem drip.   Notified Baltazar Najjar about heart rate elevating back up and fluctuating in the 100's to 140's, still in atrial fibrillation. I will continue to monitor the client closely.   Saddie Benders RN  Addendum:  Orders placed for lopressor IV once and Cardizem po q6 hours.

## 2015-12-12 NOTE — Progress Notes (Signed)
Pt c/o abd pain, nausea, states she has not had BM since before admission, MD notified, new orders received and implemented, will continue to monitor.  Edward Qualia RN

## 2015-12-12 NOTE — Progress Notes (Signed)
  Echocardiogram 2D Echocardiogram has been performed.  [name] [name] 12/12/2015, 4:16 PM

## 2015-12-12 NOTE — Progress Notes (Addendum)
Pt continues to c/o left sided abd pain with nausea and vomiting, still has not had BM, Dr Doylene Canard notified, 2 view chest xray ordered, will continue to monitor.  Edward Qualia RN

## 2015-12-13 LAB — BASIC METABOLIC PANEL
ANION GAP: 11 (ref 5–15)
BUN: 30 mg/dL — AB (ref 6–20)
CALCIUM: 8.6 mg/dL — AB (ref 8.9–10.3)
CO2: 32 mmol/L (ref 22–32)
CREATININE: 0.86 mg/dL (ref 0.44–1.00)
Chloride: 90 mmol/L — ABNORMAL LOW (ref 101–111)
GFR calc Af Amer: >60 mL/min (ref >60)
GLUCOSE: 155 mg/dL — AB (ref 65–99)
Potassium: 4.4 mmol/L (ref 3.5–5.1)
Sodium: 133 mmol/L — ABNORMAL LOW (ref 135–145)

## 2015-12-13 LAB — CBC
HCT: 40.9 % (ref 36.0–46.0)
Hemoglobin: 12 g/dL (ref 12.0–15.0)
MCH: 26 pg (ref 26.0–34.0)
MCHC: 29.3 g/dL — AB (ref 30.0–36.0)
MCV: 88.7 fL (ref 78.0–100.0)
PLATELETS: 130 10*3/uL — AB (ref 150–400)
RBC: 4.61 MIL/uL (ref 3.87–5.11)
RDW: 16.6 % — AB (ref 11.5–15.5)
WBC: 14.5 10*3/uL — ABNORMAL HIGH (ref 4.0–10.5)

## 2015-12-13 LAB — URINE CULTURE

## 2015-12-13 LAB — MRSA PCR SCREENING: MRSA BY PCR: NEGATIVE

## 2015-12-13 MED ORDER — MAGNESIUM HYDROXIDE 400 MG/5ML PO SUSP
30.0000 mL | Freq: Once | ORAL | Status: AC
  Administered 2015-12-13: 30 mL via ORAL
  Filled 2015-12-13: qty 30

## 2015-12-13 MED ORDER — DILTIAZEM HCL 60 MG PO TABS
60.0000 mg | ORAL_TABLET | Freq: Four times a day (QID) | ORAL | Status: DC
Start: 2015-12-13 — End: 2015-12-14
  Administered 2015-12-13 – 2015-12-14 (×3): 60 mg via ORAL
  Filled 2015-12-13 (×3): qty 1

## 2015-12-13 MED ORDER — POLYETHYLENE GLYCOL 3350 17 G PO PACK
17.0000 g | PACK | Freq: Every day | ORAL | Status: DC
  Administered 2015-12-13: 17 g via ORAL
  Filled 2015-12-13 (×3): qty 1

## 2015-12-13 NOTE — Consult Note (Addendum)
Ref: HAMRICK,MAURA L, MD   Subjective:  Feeling better. Heart rate in 90's. On Pradaxa. Afebrile. TSH within normal range.  Objective:  Vital Signs in the last 24 hours: Temp:  [97.4 F (36.3 C)-98.6 F (37 C)] 98.1 F (36.7 C) (09/30 0758) Pulse Rate:  [62-117] 86 (09/30 0758) Cardiac Rhythm: Atrial fibrillation (09/30 0954) Resp:  [12-18] 16 (09/30 0758) BP: (104-125)/(64-95) 104/93 (09/30 0758) SpO2:  [95 %-100 %] 97 % (09/30 0910) Weight:  [59.6 kg (131 lb 6.4 oz)] 59.6 kg (131 lb 6.4 oz) (09/30 0358)  Physical Exam: BP Readings from Last 1 Encounters:  12/13/15 (!) 104/93    Wt Readings from Last 1 Encounters:  12/13/15 59.6 kg (131 lb 6.4 oz)    Weight change: -0.045 kg (-1.6 oz)  HEENT: [place]/AT, Eyes- PERL, EOMI, Conjunctiva-Pink, Sclera-Non-icteric Neck: No JVD, No bruit, Trachea midline. Lungs:  Clearing, Bilateral. Cardiac:  Regular rhythm, normal S1 and S2, no S3. II/VI systolic murmur. Abdomen:  Soft, non-tender. Extremities:  1 + edema present. No cyanosis. No clubbing. CNS: AxOx3, Cranial nerves grossly intact, moves all 4 extremities.  Skin: Warm and dry.   Intake/Output from previous day: 09/29 0701 - 09/30 0700 In: 290 [P.O.:240; IV Piggyback:50] Out: -     Lab Results: BMET    Component Value Date/Time   NA 133 (L) 12/13/2015 0359   NA 132 (L) 12/12/2015 0417   NA 136 12/11/2015 0353   K 4.4 12/13/2015 0359   K 4.9 12/12/2015 0417   K 5.7 (H) 12/11/2015 0353   CL 90 (L) 12/13/2015 0359   CL 90 (L) 12/12/2015 0417   CL 93 (L) 12/11/2015 0353   CO2 32 12/13/2015 0359   CO2 32 12/12/2015 0417   CO2 36 (H) 12/11/2015 0353   GLUCOSE 155 (H) 12/13/2015 0359   GLUCOSE 154 (H) 12/12/2015 0417   GLUCOSE 170 (H) 12/11/2015 0353   BUN 30 (H) 12/13/2015 0359   BUN 27 (H) 12/12/2015 0417   BUN 16 12/11/2015 0353   CREATININE 0.86 12/13/2015 0359   CREATININE 1.09 (H) 12/12/2015 0417   CREATININE 0.89 12/11/2015 0353   CALCIUM 8.6 (L) 12/13/2015  0359   CALCIUM 8.8 (L) 12/12/2015 0417   CALCIUM 9.4 12/11/2015 0353   GFRNONAA >60 12/13/2015 0359   GFRNONAA 49 (L) 12/12/2015 0417   GFRNONAA >60 12/11/2015 0353   GFRAA >60 12/13/2015 0359   GFRAA 57 (L) 12/12/2015 0417   GFRAA >60 12/11/2015 0353   CBC    Component Value Date/Time   WBC 14.5 (H) 12/13/2015 0359   RBC 4.61 12/13/2015 0359   HGB 12.0 12/13/2015 0359   HCT 40.9 12/13/2015 0359   PLT 130 (L) 12/13/2015 0359   MCV 88.7 12/13/2015 0359   MCH 26.0 12/13/2015 0359   MCHC 29.3 (L) 12/13/2015 0359   RDW 16.6 (H) 12/13/2015 0359   LYMPHSABS 0.8 12/10/2015 1526   MONOABS 0.9 12/10/2015 1526   EOSABS 0.0 12/10/2015 1526   BASOSABS 0.0 12/10/2015 1526   HEPATIC Function Panel  Recent Labs  08/30/15 0825 12/10/15 1526  PROT 6.7 6.4*   HEMOGLOBIN A1C No components found for: HGA1C,  MPG CARDIAC ENZYMES Lab Results  Component Value Date   CKTOTAL 27 01/28/2012   TROPONINI <0.03 12/10/2015   TROPONINI <0.03 09/23/2015   TROPONINI <0.03 11/20/2014   BNP No results for input(s): PROBNP in the last 8760 hours. TSH  Recent Labs  12/12/15 2004  TSH 1.444   CHOLESTEROL No results for  input(s): CHOL in the last 8760 hours.  Scheduled Meds: . atorvastatin  40 mg Oral q morning - 10a  . azithromycin  500 mg Oral Daily  . cefTRIAXone (ROCEPHIN)  IV  1 g Intravenous Q24H  . dabigatran  150 mg Oral BID  . docusate sodium  100 mg Oral Q12H  . ferrous sulfate  325 mg Oral Q breakfast  . furosemide  40 mg Intravenous Daily  . guaiFENesin  1,200 mg Oral BID  . magnesium hydroxide  30 mL Oral Once  . methylPREDNISolone (SOLU-MEDROL) injection  80 mg Intravenous Q12H  . metoprolol tartrate  50 mg Oral BID  . oxybutynin  10 mg Oral QHS  . pantoprazole  40 mg Oral Daily  . polyethylene glycol  17 g Oral Daily  . sertraline  25 mg Oral QHS  . sodium chloride flush  3 mL Intravenous Q12H  . tiotropium  18 mcg Inhalation Daily   Continuous Infusions: .  diltiazem (CARDIZEM) infusion 7.5 mg/hr (12/13/15 0610)   PRN Meds:.acetaminophen, ipratropium, levalbuterol, senna-docusate  Assessment/Plan: Atrial fibrillation with controlled ventricular response, CHA2DS2VASc score of 4 COPD, acute exaceration Hypertension Hyperlipidemia H/O tobacco use disorder  Continue medical treatment.   LOS: 3 days    Dixie Dials  MD  12/13/2015, 10:49 AM

## 2015-12-13 NOTE — Progress Notes (Signed)
Diltiazem drip decreased to 5mg /hr at 1625 after PO Diltiazem 60mg  given.

## 2015-12-13 NOTE — Progress Notes (Addendum)
PROGRESS NOTE  [name] [name]  [id number] DOB: 1941-01-02 DOA: 12/10/2015 PCP: Leonides Sake, MD Outpatient Specialists:  Subjective: Had some nausea yesterday, constipation as well. Abdominal x-ray showed retained stool. Had 2 BMs overnight. Less SOB and cough  Brief Narrative:  [name] [name] is a 75 y.o. female with medical history significant of chronic respiratory failure, oxygen requiring at baseline, COPD, paroxysmal A. fib, history of right-sided pneumothorax 3 required tube thoracostomy, coronary artery disease, hypertension who presents with complaint of shortness of breath. She states that over the past 2 days, she has been short of breath and has also noticed heart palpitations starting last night. She denies any chest discomfort. She admits to cough with white sputum production. She denies any fevers, chills, rigors at home. She states that she had some nausea and vomiting last night as well but denies any symptoms at this time. She denies any abdominal discomfort but states that she has some burning with urination. Also admits to worsening peripheral edema.   Assessment & Plan:   Principal Problem:   Acute respiratory failure with hypoxia and hypercapnia (HCC) Active Problems:   COPD with acute exacerbation (HCC)   Atrial fibrillation with RVR (HCC)   Acute diastolic heart failure (HCC)   Dysuria   HLD (hyperlipidemia)   Depression   GERD (gastroesophageal reflux disease)   Acute on chronic hypoxemic and hypercapnic respiratory failure  -Multifactorial: COPD exacerbation, acute CHF, A Fib RVR -[place] O2 to maintain sat 88-92%, try to wean oxygen today. -Patient was on 6 L of oxygen, currently on 3 L of oxygen, continue to wean off of oxygen.  A fib RVR -Initially was on Cardizem drip, heart rate was in 120s to 104 hours, developed bradycardia with a heart rate went down to 40s. -Pradaxa (recently switched from xarelto to pradaxa)  -Lopressor started yesterday  and 25 mg twice a day. -I appreciate cardiology's help, after Cardizem increased to 60 mg, was briefly on Cardizem drip again by night coverage. -Cardiology please advise.  COPD exacerbation -Presented with wheezing, shortness of breath and minimal sputum/cough. -Started on IV antibiotics and IV steroids. -Continue supportive management with bronchodilators, mucolytics, antitussives and oxygen. -Continue current respiratory regimen.  Acute on chronic diastolic heart failure -Echo in 04/2014 with LVH, EF 65-70%. -BNP 735.3 on admission, still has significant lower extremity edema.  -Continue strict intake/output, daily weights, follow BMP daily and continue Lasix.  UTI -Has had recent UTI, treated with Keflex, UA still showing TNTC pus cells. -Culture showed Klebsiella pneumoniae, covered by Rocephin.  Constipation -Oriented 2 bowel movements since yesterday, the more laxatives today.  HLD -Lipitor  Depression -Zoloft   GERD -PPI   DVT prophylaxis: Pradaxa Code Status: Full Code Family Communication:  Disposition Plan:  Diet: Diet Heart Room service appropriate? Yes; Fluid consistency: Thin; Fluid restriction: 1500 mL Fluid  Consultants:   Cardiology  Procedures:   None  Antimicrobials:   Rocephin and Rocephin  Objective: Vitals:   12/13/15 0620 12/13/15 0700 12/13/15 0758 12/13/15 0910  BP: (!) 125/92  (!) 104/93   Pulse:  82 86   Resp:  14 16   Temp:  98 F (36.7 C) 98.1 F (36.7 C)   TempSrc:  Oral Oral   SpO2: 96% 97% 96% 97%  Weight:      Height:        Intake/Output Summary (Last 24 hours) at 12/13/15 [id number] Last data filed at 12/13/15 0900  Gross per 24 hour  Intake  530 ml  Output                0 ml  Net              530 ml   Filed Weights   12/11/15 0500 12/12/15 0350 12/13/15 0358  Weight: 59 kg (130 lb 1.6 oz) 59.6 kg (131 lb 8 oz) 59.6 kg (131 lb 6.4 oz)    Examination: General exam: Appears calm and comfortable    Respiratory system: Clear to auscultation. Respiratory effort normal. Cardiovascular system: S1 & S2 heard, RRR. No JVD, murmurs, rubs, gallops or clicks. No pedal edema. Gastrointestinal system: Abdomen is nondistended, soft and nontender. No organomegaly or masses felt. Normal bowel sounds heard. Central nervous system: Alert and oriented. No focal neurological deficits. Extremities: Symmetric 5 x 5 power. Skin: No rashes, lesions or ulcers Psychiatry: Judgement and insight appear normal. Mood & affect appropriate.   Data Reviewed: I have personally reviewed following labs and imaging studies  CBC:  Recent Labs Lab 12/10/15 1526 12/11/15 0353 12/12/15 0417 12/13/15 0359  WBC 9.6 5.1 20.9* 14.5*  NEUTROABS 7.9*  --   --   --   HGB 11.8* 12.1 11.9* 12.0  HCT 40.8 41.6 40.7 40.9  MCV 90.9 90.2 89.3 88.7  PLT 142* 147* 156 [id number]*   Basic Metabolic Panel:  Recent Labs Lab 12/10/15 1526 12/11/15 0353 12/12/15 0417 12/13/15 0359  NA 135 136 132* 133*  K 4.3 5.7* 4.9 4.4  CL 92* 93* 90* 90*  CO2 32 36* 32 32  GLUCOSE 109* 170* 154* 155*  BUN 11 16 27* 30*  CREATININE 0.70 0.89 1.09* 0.86  CALCIUM 9.3 9.4 8.8* 8.6*   GFR: Estimated Creatinine Clearance: 47.6 mL/min (by C-G formula based on SCr of 0.86 mg/dL). Liver Function Tests:  Recent Labs Lab 12/10/15 1526  AST 19  ALT 19  ALKPHOS 67  BILITOT 0.8  PROT 6.4*  ALBUMIN 3.4*   No results for input(s): LIPASE, AMYLASE in the last 168 hours. No results for input(s): AMMONIA in the last 168 hours. Coagulation Profile:  Recent Labs Lab 12/10/15 1526  INR 1.27   Cardiac Enzymes:  Recent Labs Lab 12/10/15 1526  TROPONINI <0.03   BNP (last 3 results) No results for input(s): PROBNP in the last 8760 hours. HbA1C: No results for input(s): HGBA1C in the last 72 hours. CBG: No results for input(s): GLUCAP in the last 168 hours. Lipid Profile: No results for input(s): CHOL, HDL, LDLCALC, TRIG, CHOLHDL,  LDLDIRECT in the last 72 hours. Thyroid Function Tests:  Recent Labs  12/12/15 2004  TSH 1.444   Anemia Panel: No results for input(s): VITAMINB12, FOLATE, FERRITIN, TIBC, IRON, RETICCTPCT in the last 72 hours. Urine analysis:    Component Value Date/Time   COLORURINE YELLOW 12/11/2015 0652   APPEARANCEUR CLOUDY (A) 12/11/2015 0652   LABSPEC 1.017 12/11/2015 0652   PHURINE 6.0 12/11/2015 0652   GLUCOSEU NEGATIVE 12/11/2015 0652   HGBUR LARGE (A) 12/11/2015 0652   BILIRUBINUR NEGATIVE 12/11/2015 0652   KETONESUR 15 (A) 12/11/2015 0652   PROTEINUR NEGATIVE 12/11/2015 0652   UROBILINOGEN 1.0 11/23/2014 0249   NITRITE NEGATIVE 12/11/2015 0652   LEUKOCYTESUR LARGE (A) 12/11/2015 0652   Sepsis Labs: @LABRCNTIP (procalcitonin:4,lacticidven:4)  ) Recent Results (from the past 240 hour(s))  Culture, Urine     Status: Abnormal   Collection Time: 12/11/15  6:52 AM  Result Value Ref Range Status   Specimen Description URINE, RANDOM  Final  Special Requests NONE  Final   Culture >=100,000 COLONIES/mL KLEBSIELLA PNEUMONIAE (A)  Final   Report Status 12/13/2015 FINAL  Final   Organism ID, Bacteria KLEBSIELLA PNEUMONIAE (A)  Final      Susceptibility   Klebsiella pneumoniae - MIC*    AMPICILLIN >=32 RESISTANT Resistant     CEFAZOLIN <=4 SENSITIVE Sensitive     CEFTRIAXONE <=1 SENSITIVE Sensitive     CIPROFLOXACIN <=0.25 SENSITIVE Sensitive     GENTAMICIN <=1 SENSITIVE Sensitive     IMIPENEM <=0.25 SENSITIVE Sensitive     NITROFURANTOIN 32 SENSITIVE Sensitive     TRIMETH/SULFA >=320 RESISTANT Resistant     AMPICILLIN/SULBACTAM 4 SENSITIVE Sensitive     PIP/TAZO <=4 SENSITIVE Sensitive     Extended ESBL NEGATIVE Sensitive     * >=100,000 COLONIES/mL KLEBSIELLA PNEUMONIAE  MRSA PCR Screening     Status: None   Collection Time: 12/13/15  5:41 AM  Result Value Ref Range Status   MRSA by PCR NEGATIVE NEGATIVE Final    Comment:        The GeneXpert MRSA Assay (FDA approved for  NASAL specimens only), is one component of a comprehensive MRSA colonization surveillance program. It is not intended to diagnose MRSA infection nor to guide or monitor treatment for MRSA infections.      Invalid input(s): PROCALCITONIN, LACTICACIDVEN   Radiology Studies: Dg Abd 2 Views  Result Date: 12/12/2015 CLINICAL DATA:  Abdominal pain for a few days. History of hypertension, COPD, asthma, shortness of breath. EXAM: ABDOMEN - 2 VIEW COMPARISON:  Chest 09/23/2015 FINDINGS: Gas and stool throughout the colon. No small or large bowel distention. No free intra-abdominal air. No abnormal air-fluid levels. No radiopaque stones. Surgical clips in the right upper quadrant. Vascular calcifications. Degenerative changes in the spine. Right hip hemiarthroplasty. Small pleural effusions in the lung bases. IMPRESSION: Nonobstructive bowel gas pattern with diffusely stool-filled colon. Small bilateral pleural effusions. Electronically Signed   By: Lucienne Capers M.D.   On: 12/12/2015 22:54        Scheduled Meds: . atorvastatin  40 mg Oral q morning - 10a  . azithromycin  500 mg Oral Daily  . cefTRIAXone (ROCEPHIN)  IV  1 g Intravenous Q24H  . dabigatran  150 mg Oral BID  . digoxin  0.25 mg Intravenous Daily  . docusate sodium  100 mg Oral Q12H  . ferrous sulfate  325 mg Oral Q breakfast  . furosemide  40 mg Intravenous Daily  . guaiFENesin  1,200 mg Oral BID  . methylPREDNISolone (SOLU-MEDROL) injection  80 mg Intravenous Q12H  . metoprolol tartrate  50 mg Oral BID  . oxybutynin  10 mg Oral QHS  . pantoprazole  40 mg Oral Daily  . sertraline  25 mg Oral QHS  . sodium chloride flush  3 mL Intravenous Q12H  . tiotropium  18 mcg Inhalation Daily   Continuous Infusions: . diltiazem (CARDIZEM) infusion 7.5 mg/hr (12/13/15 0610)     LOS: 3 days    Time spent: 35 minutes    Wilfrido Luedke A, MD [facility] Pager [phone]  If 7PM-7AM, please contact  night-coverage [email or web address] Password [email or web address] 12/13/2015, 9:28 AM

## 2015-12-14 LAB — CBC
HCT: 41.9 % (ref 36.0–46.0)
HEMOGLOBIN: 12.2 g/dL (ref 12.0–15.0)
MCH: 26 pg (ref 26.0–34.0)
MCHC: 29.1 g/dL — ABNORMAL LOW (ref 30.0–36.0)
MCV: 89.1 fL (ref 78.0–100.0)
Platelets: 144 10*3/uL — ABNORMAL LOW (ref 150–400)
RBC: 4.7 MIL/uL (ref 3.87–5.11)
RDW: 16.4 % — ABNORMAL HIGH (ref 11.5–15.5)
WBC: 12 10*3/uL — ABNORMAL HIGH (ref 4.0–10.5)

## 2015-12-14 LAB — BASIC METABOLIC PANEL
Anion gap: 6 (ref 5–15)
BUN: 26 mg/dL — ABNORMAL HIGH (ref 6–20)
CALCIUM: 8.5 mg/dL — AB (ref 8.9–10.3)
CO2: 36 mmol/L — ABNORMAL HIGH (ref 22–32)
CREATININE: 0.78 mg/dL (ref 0.44–1.00)
Chloride: 93 mmol/L — ABNORMAL LOW (ref 101–111)
Glucose, Bld: 168 mg/dL — ABNORMAL HIGH (ref 65–99)
Potassium: 5.1 mmol/L (ref 3.5–5.1)
SODIUM: 135 mmol/L (ref 135–145)

## 2015-12-14 MED ORDER — FUROSEMIDE 10 MG/ML IJ SOLN
40.0000 mg | Freq: Once | INTRAMUSCULAR | Status: AC
  Administered 2015-12-14: 40 mg via INTRAVENOUS
  Filled 2015-12-14: qty 4

## 2015-12-14 MED ORDER — DIGOXIN 125 MCG PO TABS
0.1250 mg | ORAL_TABLET | Freq: Every day | ORAL | Status: DC
Start: 2015-12-14 — End: 2015-12-16
  Administered 2015-12-14 – 2015-12-16 (×3): 0.125 mg via ORAL
  Filled 2015-12-14 (×3): qty 1

## 2015-12-14 MED ORDER — DILTIAZEM HCL 30 MG PO TABS
90.0000 mg | ORAL_TABLET | Freq: Four times a day (QID) | ORAL | Status: DC
  Administered 2015-12-14 – 2015-12-16 (×9): 90 mg via ORAL
  Filled 2015-12-14 (×9): qty 1

## 2015-12-14 NOTE — Progress Notes (Signed)
PROGRESS NOTE  [name] [name]  [id number] DOB: [birth year] DOA: 12/10/2015 PCP: Leonides Sake, MD Outpatient Specialists:  Subjective: Feeling she is doing much better, has nausea every now and then but no vomiting, had good bowel movement. We'll try to ambulate. Elevate the legs and continue diuresis.  Brief Narrative:  [name] [name] is a 75 y.o. female with medical history significant of chronic respiratory failure, oxygen requiring at baseline, COPD, paroxysmal A. fib, history of right-sided pneumothorax 3 required tube thoracostomy, coronary artery disease, hypertension who presents with complaint of shortness of breath. She states that over the past 2 days, she has been short of breath and has also noticed heart palpitations starting last night. She denies any chest discomfort. She admits to cough with white sputum production. She denies any fevers, chills, rigors at home. She states that she had some nausea and vomiting last night as well but denies any symptoms at this time. She denies any abdominal discomfort but states that she has some burning with urination. Also admits to worsening peripheral edema.   Assessment & Plan:   Principal Problem:   Acute respiratory failure with hypoxia and hypercapnia (HCC) Active Problems:   COPD with acute exacerbation (HCC)   Atrial fibrillation with RVR (HCC)   Acute diastolic heart failure (HCC)   Dysuria   HLD (hyperlipidemia)   Depression   GERD (gastroesophageal reflux disease)   Acute on chronic hypoxemic and hypercapnic respiratory failure  -Multifactorial: COPD exacerbation, acute CHF, A Fib RVR -Green O2 to maintain sat 88-92%, try to wean oxygen today. -Patient was on 6 L of oxygen, currently on 3 L of oxygen, continue to wean oxygen.  A fib RVR -Initially was on Cardizem drip, heart rate was in 120s to 104 hours, developed bradycardia with a heart rate went down to 40s. -Pradaxa (recently switched from xarelto to pradaxa)   -Currently on Cardizem, Lopressor and digoxin. Rate is more controlled, increase Cardizem.  COPD exacerbation -Presented with wheezing, shortness of breath and minimal sputum/cough. -Started on IV antibiotics and IV steroids. -Continue supportive management with bronchodilators, mucolytics, antitussives and oxygen. -Continue current respiratory regimen.  Acute on chronic diastolic heart failure -Echo in 04/2014 with LVH, EF 65-70%. -BNP 735.3 on admission, still has significant lower extremity edema.  -Continue strict intake/output, daily weights, follow BMP daily and continue Lasix.  UTI -Has had recent UTI, treated with Keflex, UA still showing TNTC pus cells. -Culture showed Klebsiella pneumoniae, covered by Rocephin.  Constipation -Oriented 2 bowel movements since yesterday, the more laxatives today.  HLD -Lipitor  Depression -Zoloft   GERD -PPI   DVT prophylaxis: Pradaxa Code Status: Full Code Family Communication:  Disposition Plan:  Diet: Diet Heart Room service appropriate? Yes; Fluid consistency: Thin; Fluid restriction: 1500 mL Fluid  Consultants:   Cardiology  Procedures:   None  Antimicrobials:   Rocephin and Rocephin  Objective: Vitals:   12/14/15 0443 12/14/15 0500 12/14/15 0754 12/14/15 0849  BP: 123/75  120/78   Pulse:   (!) 104   Resp: 16  15   Temp: 97.9 F (36.6 C)  97.8 F (36.6 C)   TempSrc: Axillary  Axillary   SpO2: 99%  98% 97%  Weight:  57.4 kg (126 lb 9.6 oz)    Height:        Intake/Output Summary (Last 24 hours) at 12/14/15 0951 Last data filed at 12/13/15 2200  Gross per 24 hour  Intake  603 ml  Output                0 ml  Net              603 ml   Filed Weights   12/12/15 0350 12/13/15 0358 12/14/15 0500  Weight: 59.6 kg (131 lb 8 oz) 59.6 kg (131 lb 6.4 oz) 57.4 kg (126 lb 9.6 oz)    Examination: General exam: Appears calm and comfortable  Respiratory system: Clear to auscultation.  Respiratory effort normal. Cardiovascular system: S1 & S2 heard, RRR. No JVD, murmurs, rubs, gallops or clicks. No pedal edema. Gastrointestinal system: Abdomen is nondistended, soft and nontender. No organomegaly or masses felt. Normal bowel sounds heard. Central nervous system: Alert and oriented. No focal neurological deficits. Extremities: Symmetric 5 x 5 power. Skin: No rashes, lesions or ulcers Psychiatry: Judgement and insight appear normal. Mood & affect appropriate.   Data Reviewed: I have personally reviewed following labs and imaging studies  CBC:  Recent Labs Lab 12/10/15 1526 12/11/15 0353 12/12/15 0417 12/13/15 0359 12/14/15 0324  WBC 9.6 5.1 20.9* 14.5* 12.0*  NEUTROABS 7.9*  --   --   --   --   HGB 11.8* 12.1 11.9* 12.0 12.2  HCT 40.8 41.6 40.7 40.9 41.9  MCV 90.9 90.2 89.3 88.7 89.1  PLT 142* 147* 156 130* [id number]*   Basic Metabolic Panel:  Recent Labs Lab 12/10/15 1526 12/11/15 0353 12/12/15 0417 12/13/15 0359 12/14/15 0324  NA 135 136 132* 133* 135  K 4.3 5.7* 4.9 4.4 5.1  CL 92* 93* 90* 90* 93*  CO2 32 36* 32 32 36*  GLUCOSE 109* 170* 154* 155* 168*  BUN 11 16 27* 30* 26*  CREATININE 0.70 0.89 1.09* 0.86 0.78  CALCIUM 9.3 9.4 8.8* 8.6* 8.5*   GFR: Estimated Creatinine Clearance: 50.3 mL/min (by C-G formula based on SCr of 0.78 mg/dL). Liver Function Tests:  Recent Labs Lab 12/10/15 1526  AST 19  ALT 19  ALKPHOS 67  BILITOT 0.8  PROT 6.4*  ALBUMIN 3.4*   No results for input(s): LIPASE, AMYLASE in the last 168 hours. No results for input(s): AMMONIA in the last 168 hours. Coagulation Profile:  Recent Labs Lab 12/10/15 1526  INR 1.27   Cardiac Enzymes:  Recent Labs Lab 12/10/15 1526  TROPONINI <0.03   BNP (last 3 results) No results for input(s): PROBNP in the last 8760 hours. HbA1C: No results for input(s): HGBA1C in the last 72 hours. CBG: No results for input(s): GLUCAP in the last 168 hours. Lipid Profile: No results  for input(s): CHOL, HDL, LDLCALC, TRIG, CHOLHDL, LDLDIRECT in the last 72 hours. Thyroid Function Tests:  Recent Labs  12/12/15 2004  TSH 1.444   Anemia Panel: No results for input(s): VITAMINB12, FOLATE, FERRITIN, TIBC, IRON, RETICCTPCT in the last 72 hours. Urine analysis:    Component Value Date/Time   COLORURINE YELLOW 12/11/2015 0652   APPEARANCEUR CLOUDY (A) 12/11/2015 0652   LABSPEC 1.017 12/11/2015 0652   PHURINE 6.0 12/11/2015 0652   GLUCOSEU NEGATIVE 12/11/2015 0652   HGBUR LARGE (A) 12/11/2015 0652   BILIRUBINUR NEGATIVE 12/11/2015 0652   KETONESUR 15 (A) 12/11/2015 0652   PROTEINUR NEGATIVE 12/11/2015 0652   UROBILINOGEN 1.0 11/23/2014 0249   NITRITE NEGATIVE 12/11/2015 0652   LEUKOCYTESUR LARGE (A) 12/11/2015 0652   Sepsis Labs: @LABRCNTIP (procalcitonin:4,lacticidven:4)  ) Recent Results (from the past 240 hour(s))  Culture, Urine     Status: Abnormal   Collection Time: 12/11/15  6:52 AM  Result Value Ref Range Status   Specimen Description URINE, RANDOM  Final   Special Requests NONE  Final   Culture >=100,000 COLONIES/mL KLEBSIELLA PNEUMONIAE (A)  Final   Report Status 12/13/2015 FINAL  Final   Organism ID, Bacteria KLEBSIELLA PNEUMONIAE (A)  Final      Susceptibility   Klebsiella pneumoniae - MIC*    AMPICILLIN >=32 RESISTANT Resistant     CEFAZOLIN <=4 SENSITIVE Sensitive     CEFTRIAXONE <=1 SENSITIVE Sensitive     CIPROFLOXACIN <=0.25 SENSITIVE Sensitive     GENTAMICIN <=1 SENSITIVE Sensitive     IMIPENEM <=0.25 SENSITIVE Sensitive     NITROFURANTOIN 32 SENSITIVE Sensitive     TRIMETH/SULFA >=320 RESISTANT Resistant     AMPICILLIN/SULBACTAM 4 SENSITIVE Sensitive     PIP/TAZO <=4 SENSITIVE Sensitive     Extended ESBL NEGATIVE Sensitive     * >=100,000 COLONIES/mL KLEBSIELLA PNEUMONIAE  MRSA PCR Screening     Status: None   Collection Time: 12/13/15  5:41 AM  Result Value Ref Range Status   MRSA by PCR NEGATIVE NEGATIVE Final    Comment:          The GeneXpert MRSA Assay (FDA approved for NASAL specimens only), is one component of a comprehensive MRSA colonization surveillance program. It is not intended to diagnose MRSA infection nor to guide or monitor treatment for MRSA infections.      Invalid input(s): PROCALCITONIN, LACTICACIDVEN   Radiology Studies: Dg Abd 2 Views  Result Date: 12/12/2015 CLINICAL DATA:  Abdominal pain for a few days. History of hypertension, COPD, asthma, shortness of breath. EXAM: ABDOMEN - 2 VIEW COMPARISON:  Chest 09/23/2015 FINDINGS: Gas and stool throughout the colon. No small or large bowel distention. No free intra-abdominal air. No abnormal air-fluid levels. No radiopaque stones. Surgical clips in the right upper quadrant. Vascular calcifications. Degenerative changes in the spine. Right hip hemiarthroplasty. Small pleural effusions in the lung bases. IMPRESSION: Nonobstructive bowel gas pattern with diffusely stool-filled colon. Small bilateral pleural effusions. Electronically Signed   By: Lucienne Capers M.D.   On: 12/12/2015 22:54        Scheduled Meds: . atorvastatin  40 mg Oral q morning - 10a  . [COMPLETED] azithromycin  500 mg Oral Daily  . cefTRIAXone (ROCEPHIN)  IV  1 g Intravenous Q24H  . dabigatran  150 mg Oral BID  . diltiazem  60 mg Oral Q6H  . docusate sodium  100 mg Oral Q12H  . ferrous sulfate  325 mg Oral Q breakfast  . furosemide  40 mg Intravenous Daily  . guaiFENesin  1,200 mg Oral BID  . methylPREDNISolone (SOLU-MEDROL) injection  80 mg Intravenous Q12H  . metoprolol tartrate  50 mg Oral BID  . oxybutynin  10 mg Oral QHS  . pantoprazole  40 mg Oral Daily  . polyethylene glycol  17 g Oral Daily  . sertraline  25 mg Oral QHS  . sodium chloride flush  3 mL Intravenous Q12H  . tiotropium  18 mcg Inhalation Daily   Continuous Infusions:     LOS: 4 days    Time spent: 35 minutes    Cathlin Buchan A, MD [facility] Pager [phone]  If 7PM-7AM,  please contact night-coverage [email or web address] Password [email or web address] 12/14/2015, 9:51 AM

## 2015-12-14 NOTE — Evaluation (Signed)
Physical Therapy Evaluation Patient Details Name: [name] [name] MRN: [id number] DOB: [birth year] Today's Date: 12/14/2015   History of Present Illness   y.o. female with medical history significant of chronic respiratory failure, oxygen requiring at baseline, COPD, paroxysmal A. fib, history of right-sided pneumothorax 3 required tube thoracostomy, coronary artery disease, hypertension who presents with complaint of shortness of breath  Clinical Impression  Patient demonstrates deficits in functional mobility as indicated below. Will need continued skilled PT to address deficits and maximize function. Will see as indicated and progress as tolerated. OF NOTE: session limited by patient dizziness, reports of spinning counter clockwise sensation with transitional movement, may benefit from vestibular evaluation.    Follow Up Recommendations Home health PT;Supervision/Assistance - 24 hour    Equipment Recommendations  None recommended by PT    Recommendations for Other Services       Precautions / Restrictions Precautions Precautions: Fall Precaution Comments: oxygen dependent 3 liters Restrictions Weight Bearing Restrictions: No      Mobility  Bed Mobility Overal bed mobility: Needs Assistance Bed Mobility: Supine to Sit     Supine to sit: Min assist     General bed mobility comments: patient required min assist to come to EOB, increased time and effort, assist via UE support to pull trunk to upright and rotate hips to EOB  Transfers Overall transfer level: Needs assistance Equipment used: 1 person hand held assist Transfers: Sit to/from [facility] Sit to Stand: Min assist Stand pivot transfers: Min assist       General transfer comment: Min assist for stability, transferred from bed to [facility], complains of dizziness during transition  Ambulation/Gait             General Gait Details: defferred due to dizziness today  Stairs             Wheelchair Mobility    Modified Rankin (Stroke Patients Only)       Balance Overall balance assessment: Needs assistance Sitting-balance support: Feet supported Sitting balance-Leahy Scale: Fair Sitting balance - Comments: able to sit EOB self support, able to sit on BSC without assist     Standing balance-Leahy Scale: Poor Standing balance comment: required UE support for standing during pericare                             Pertinent Vitals/Pain      Home Living Family/patient expects to be discharged to:: Private residence Living Arrangements: Alone Available Help at Discharge: Family;Personal care attendant;Available PRN/intermittently Type of Home: House Home Access: Level entry     Home Layout: One level Home Equipment: Walker - 4 wheels;Bedside commode Additional Comments: wears home O2 2L /min, sleeps on couch with BSC beside it; others take her to store --she does not drive, reports HHPT and caregiver daily    Prior Function Level of Independence: Independent with assistive device(s)         Comments: Pt does not drive; family assists with grocery shopping     Hand Dominance   Dominant Hand: Left    Extremity/Trunk Assessment   Upper Extremity Assessment: Generalized weakness           Lower Extremity Assessment: Generalized weakness         Communication   Communication: HOH  Cognition Arousal/Alertness: Awake/alert Behavior During Therapy: WFL for tasks assessed/performed Overall Cognitive Status: No family/caregiver present to determine baseline cognitive functioning  General Comments      Exercises     Assessment/Plan    PT Assessment Patient needs continued PT services  PT Problem List Decreased strength;Decreased activity tolerance;Decreased balance;Decreased mobility;Decreased safety awareness;Cardiopulmonary status limiting activity          PT Treatment Interventions DME  instruction;Gait training;Functional mobility training;Therapeutic activities;Therapeutic exercise;Balance training;Patient/family education    PT Goals (Current goals can be found in the Care Plan section)  Acute Rehab PT Goals Patient Stated Goal: to go back home PT Goal Formulation: With patient Time For Goal Achievement: 12/28/15 Potential to Achieve Goals: Good    Frequency Min 3X/week   Barriers to discharge        Co-evaluation               End of Session Equipment Utilized During Treatment: Oxygen Activity Tolerance: Patient limited by fatigue (dizziness) Patient left: in chair;with call bell/phone within reach;with chair alarm set Nurse Communication: Mobility status         Time: 1208-1230 PT Time Calculation (min) (ACUTE ONLY): 22 min   Charges:   PT Evaluation $PT Eval Moderate Complexity: 1 Procedure     PT G CodesDuncan Dull December 19, 2015, 4:26 PM Alben Deeds, [place] DPT  [phone]

## 2015-12-14 NOTE — Consult Note (Addendum)
Ref: HAMRICK,MAURA L, MD   Subjective:  Not feeling well. Dizzy when tries to sit up or walk. Afebrile.  Objective:  Vital Signs in the last 24 hours: Temp:  [97.7 F (36.5 C)-97.9 F (36.6 C)] 97.8 F (36.6 C) (10/01 0754) Pulse Rate:  [80-104] 104 (10/01 0754) Cardiac Rhythm: Atrial fibrillation (10/01 0719) Resp:  [15-20] 15 (10/01 0754) BP: (108-137)/(72-102) 120/78 (10/01 0754) SpO2:  [96 %-100 %] 97 % (10/01 0849) Weight:  [57.4 kg (126 lb 9.6 oz)] 57.4 kg (126 lb 9.6 oz) (10/01 0500)  Physical Exam: BP Readings from Last 1 Encounters:  12/14/15 120/78    Wt Readings from Last 1 Encounters:  12/14/15 57.4 kg (126 lb 9.6 oz)    Weight change: -2.177 kg (-4 lb 12.8 oz)  HEENT: [place]/AT, Eyes- PERL, EOMI, Conjunctiva-Pink, Sclera-Non-icteric Neck: No JVD, No bruit, Trachea midline. Lungs:  Fine crackles with minimal wheezing, Bilateral. Cardiac:  Irregular rhythm, normal S1 and S2, no S3. II/VI systolic murmur. Abdomen:  Soft, non-tender. Extremities:  1 + edema present. No cyanosis. No clubbing. CNS: AxOx3, Cranial nerves grossly intact, moves all 4 extremities.  Skin: Warm and dry.   Intake/Output from previous day: 09/30 0701 - 10/01 0700 In: [id number] [P.O.:840; I.V.:3] Out: -     Lab Results: BMET    Component Value Date/Time   NA 135 12/14/2015 0324   NA 133 (L) 12/13/2015 0359   NA 132 (L) 12/12/2015 0417   K 5.1 12/14/2015 0324   K 4.4 12/13/2015 0359   K 4.9 12/12/2015 0417   CL 93 (L) 12/14/2015 0324   CL 90 (L) 12/13/2015 0359   CL 90 (L) 12/12/2015 0417   CO2 36 (H) 12/14/2015 0324   CO2 32 12/13/2015 0359   CO2 32 12/12/2015 0417   GLUCOSE 168 (H) 12/14/2015 0324   GLUCOSE 155 (H) 12/13/2015 0359   GLUCOSE 154 (H) 12/12/2015 0417   BUN 26 (H) 12/14/2015 0324   BUN 30 (H) 12/13/2015 0359   BUN 27 (H) 12/12/2015 0417   CREATININE 0.78 12/14/2015 0324   CREATININE 0.86 12/13/2015 0359   CREATININE 1.09 (H) 12/12/2015 0417   CALCIUM 8.5 (L)  12/14/2015 0324   CALCIUM 8.6 (L) 12/13/2015 0359   CALCIUM 8.8 (L) 12/12/2015 0417   GFRNONAA >60 12/14/2015 0324   GFRNONAA >60 12/13/2015 0359   GFRNONAA 49 (L) 12/12/2015 0417   GFRAA >60 12/14/2015 0324   GFRAA >60 12/13/2015 0359   GFRAA 57 (L) 12/12/2015 0417   CBC    Component Value Date/Time   WBC 12.0 (H) 12/14/2015 0324   RBC 4.70 12/14/2015 0324   HGB 12.2 12/14/2015 0324   HCT 41.9 12/14/2015 0324   PLT 144 (L) 12/14/2015 0324   MCV 89.1 12/14/2015 0324   MCH 26.0 12/14/2015 0324   MCHC 29.1 (L) 12/14/2015 0324   RDW 16.4 (H) 12/14/2015 0324   LYMPHSABS 0.8 12/10/2015 1526   MONOABS 0.9 12/10/2015 1526   EOSABS 0.0 12/10/2015 1526   BASOSABS 0.0 12/10/2015 1526   HEPATIC Function Panel  Recent Labs  08/30/15 0825 12/10/15 1526  PROT 6.7 6.4*   HEMOGLOBIN A1C No components found for: HGA1C,  MPG CARDIAC ENZYMES Lab Results  Component Value Date   CKTOTAL 27 01/28/2012   TROPONINI <0.03 12/10/2015   TROPONINI <0.03 09/23/2015   TROPONINI <0.03 11/20/2014   BNP No results for input(s): PROBNP in the last 8760 hours. TSH  Recent Labs  12/12/15 2004  TSH 1.444  CHOLESTEROL No results for input(s): CHOL in the last 8760 hours.  Scheduled Meds: . atorvastatin  40 mg Oral q morning - 10a  . cefTRIAXone (ROCEPHIN)  IV  1 g Intravenous Q24H  . dabigatran  150 mg Oral BID  . diltiazem  90 mg Oral Q6H  . docusate sodium  100 mg Oral Q12H  . ferrous sulfate  325 mg Oral Q breakfast  . furosemide  40 mg Intravenous Daily  . furosemide  40 mg Intravenous Once  . guaiFENesin  1,200 mg Oral BID  . methylPREDNISolone (SOLU-MEDROL) injection  80 mg Intravenous Q12H  . metoprolol tartrate  50 mg Oral BID  . oxybutynin  10 mg Oral QHS  . pantoprazole  40 mg Oral Daily  . polyethylene glycol  17 g Oral Daily  . sertraline  25 mg Oral QHS  . sodium chloride flush  3 mL Intravenous Q12H  . tiotropium  18 mcg Inhalation Daily   Continuous Infusions:   PRN Meds:.acetaminophen, ipratropium, levalbuterol, senna-docusate  Assessment/Plan: Atrial fibrillation with controlled ventricular response, CHA2DS2VASc score of 4 COPD, acute exacerbation Hypertension Hyperlipidemia H/O tobacco use disorder Dizziness  Increase activity as tolerated.   LOS: 4 days    Dixie Dials  MD  12/14/2015, 11:22 AM

## 2015-12-15 LAB — BASIC METABOLIC PANEL
Anion gap: 7 (ref 5–15)
BUN: 26 mg/dL — ABNORMAL HIGH (ref 6–20)
CALCIUM: 8.6 mg/dL — AB (ref 8.9–10.3)
CO2: 38 mmol/L — AB (ref 22–32)
CREATININE: 0.68 mg/dL (ref 0.44–1.00)
Chloride: 91 mmol/L — ABNORMAL LOW (ref 101–111)
GFR calc non Af Amer: >60 mL/min (ref >60)
GLUCOSE: 167 mg/dL — AB (ref 65–99)
Potassium: 4.3 mmol/L (ref 3.5–5.1)
Sodium: 136 mmol/L (ref 135–145)

## 2015-12-15 IMAGING — CR DG CHEST 1V PORT
1 series · 1 of 1 positions shown · non-contrast
Comparison: Portable exam 1875 hr compared to 07/08/2013

CLINICAL DATA: Urethral bleeding, GI bleeding, hypertension,
coronary artery disease post MI, COPD

EXAM:
PORTABLE CHEST - 1 VIEW

[AP]
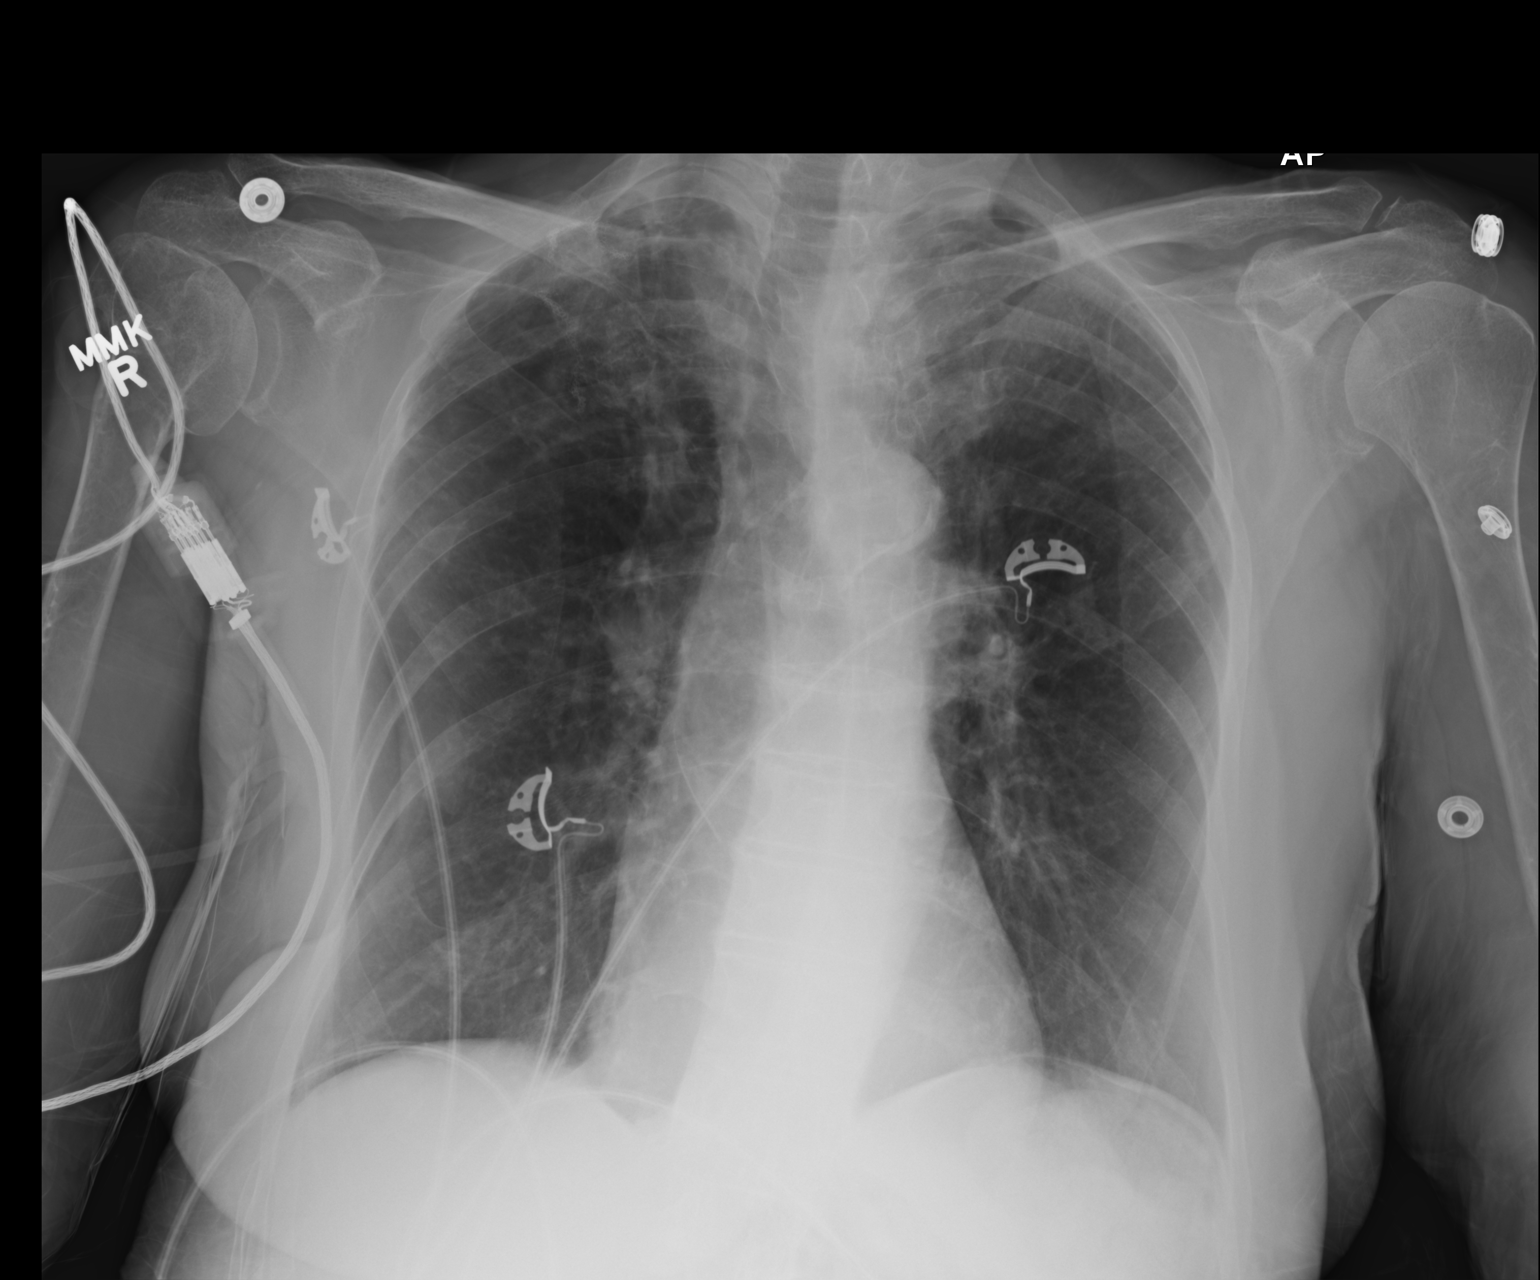

[1 of 1 positions shown; findings below may reference images not displayed]

FINDINGS: Normal heart size, mediastinal contours, and pulmonary vascularity.

Calcified tortuous thoracic aorta.

Emphysematous changes with biapical scarring and evidence of prior
RIGHT apex pulmonary resection.

No acute infiltrate, pleural effusion or pneumothorax.

Bones demineralized.

Old posttraumatic deformity proximal LEFT humerus.
IMPRESSION: Emphysematous changes with biapical scarring.

No acute abnormalities.

## 2015-12-15 IMAGING — CR DG FOOT COMPLETE 3+V*R*
3 series · 3 of 3 positions shown · non-contrast
Comparison: None.

CLINICAL DATA: Right foot injury, possible trauma.

EXAM:
RIGHT FOOT COMPLETE - 3+ VIEW

[view not recorded (1 of 3)]
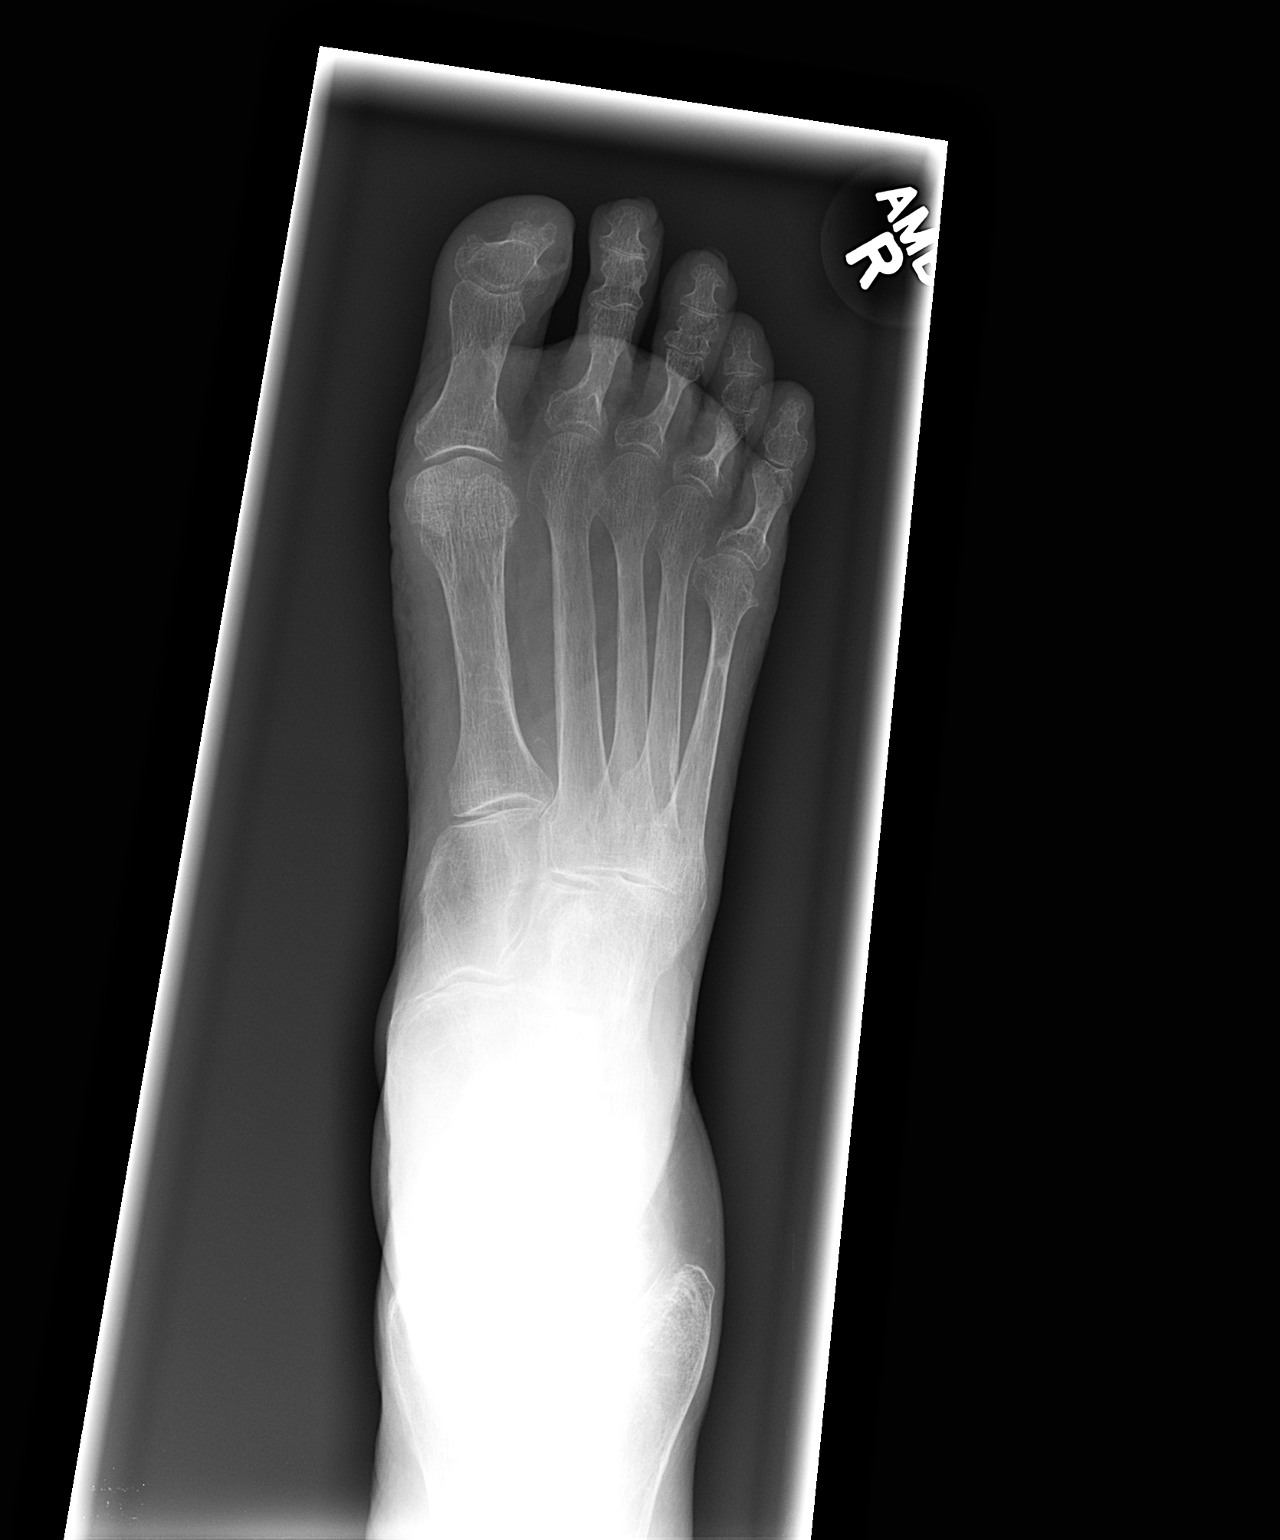

[view not recorded (2 of 3)]
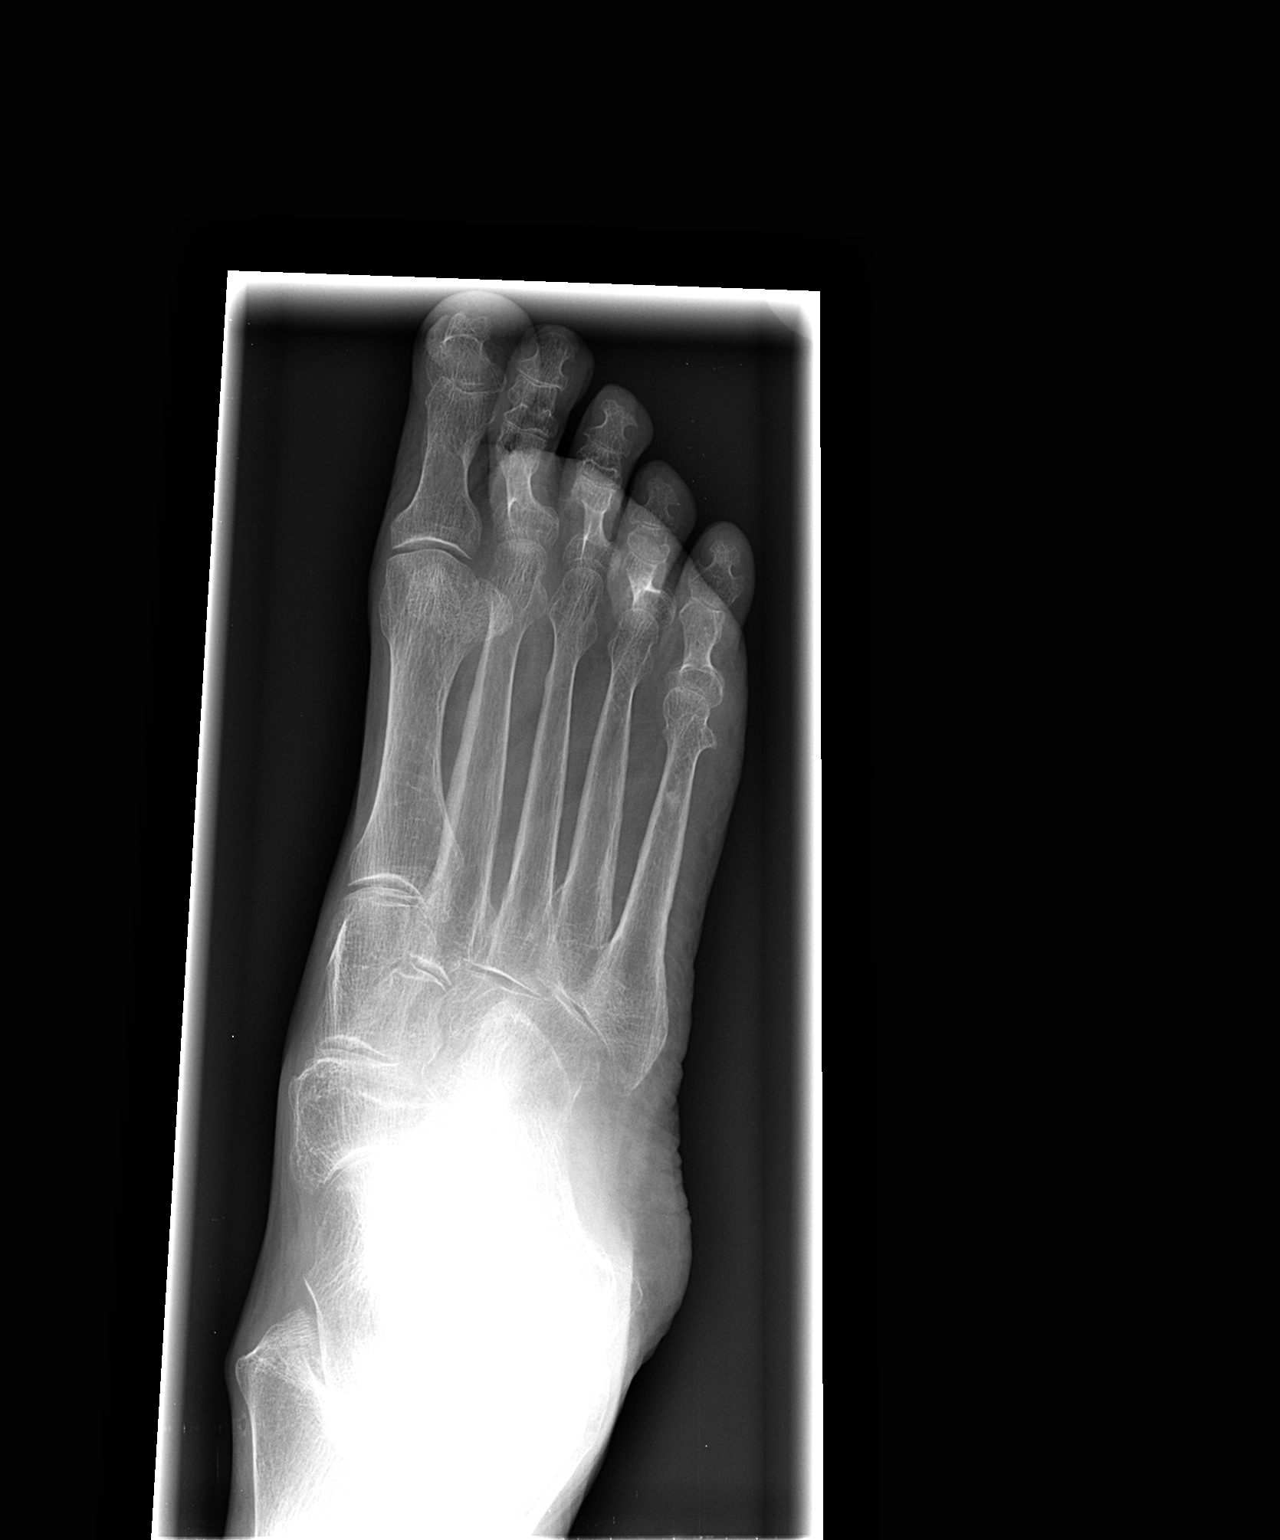

[view not recorded (3 of 3)]
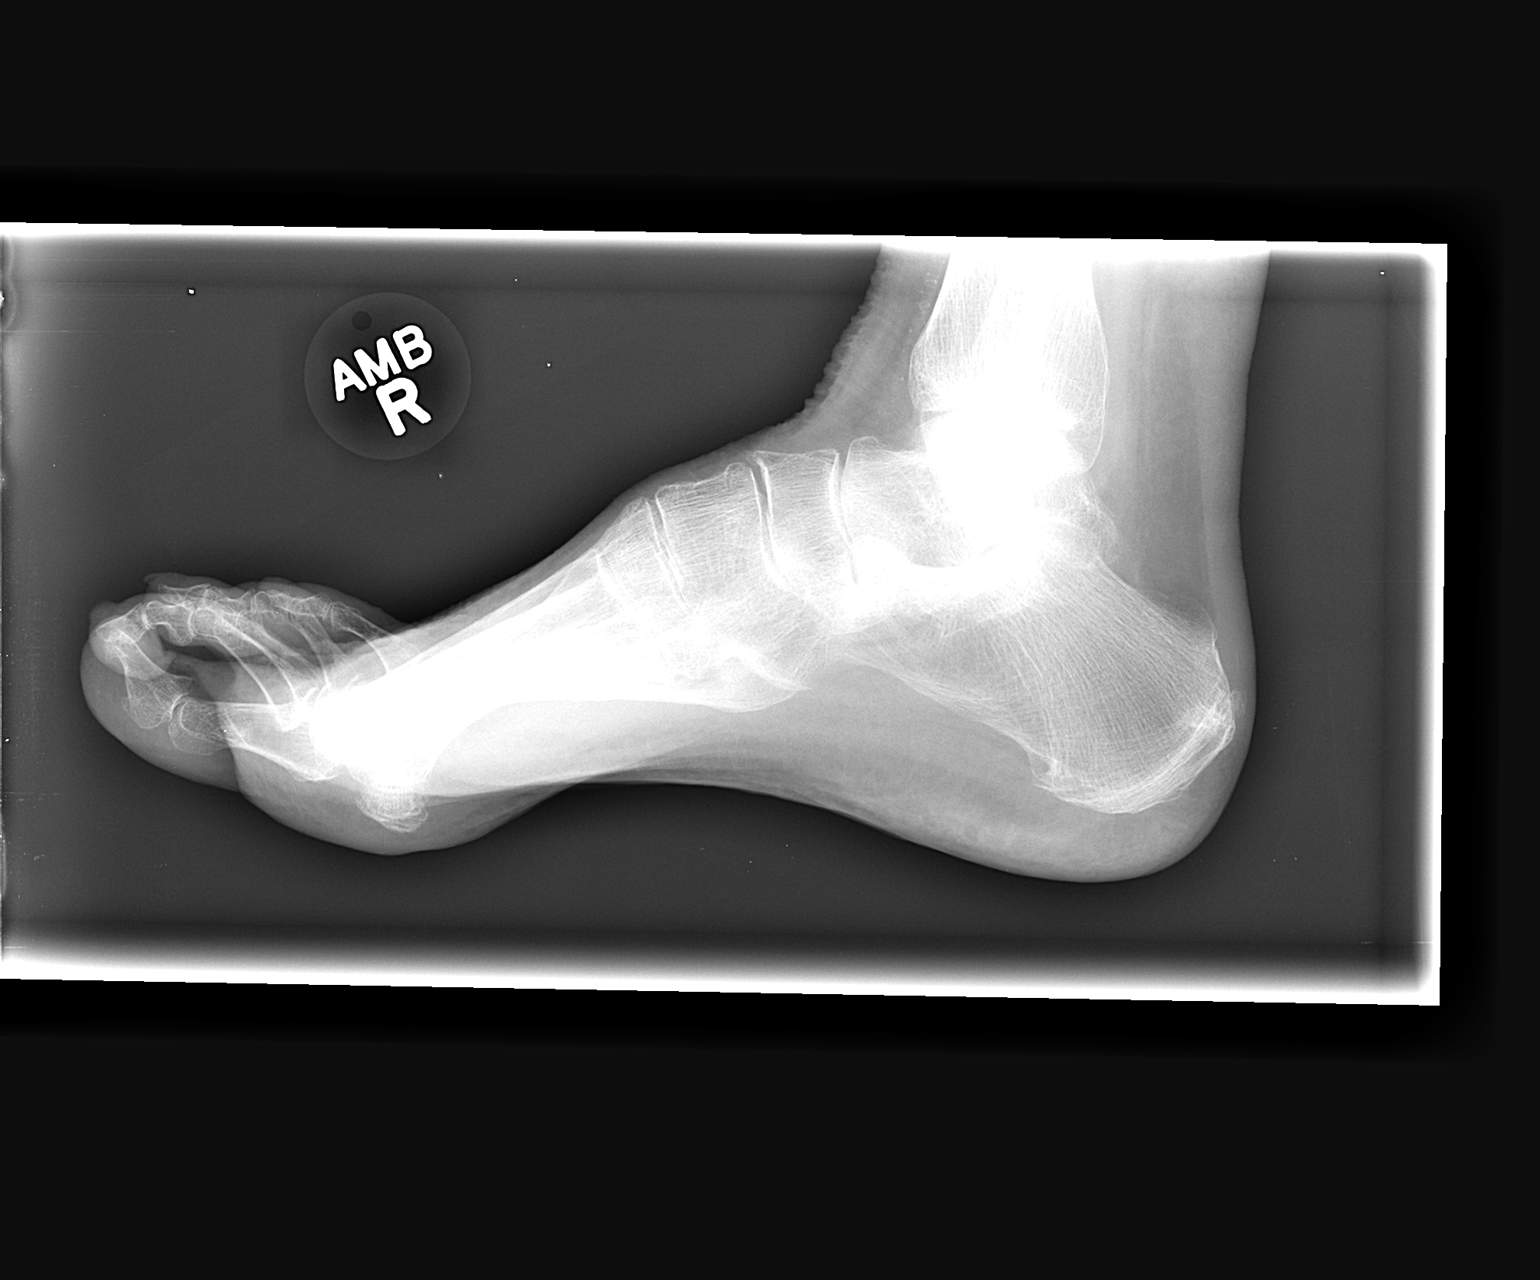

[3 of 3 positions shown; findings below may reference images not displayed]

FINDINGS: There is diffuse osteopenia. The phalanges and metatarsals are
intact. Mild degenerative interphalangeal joint changes are present.
There is a tiny plantar calcaneal spur. The soft tissues are normal.
IMPRESSION: No acute bony abnormality of the right foot.

## 2015-12-15 MED ORDER — FUROSEMIDE 10 MG/ML IJ SOLN
40.0000 mg | Freq: Once | INTRAMUSCULAR | Status: DC
  Filled 2015-12-15: qty 4

## 2015-12-15 MED ORDER — PREDNISONE 20 MG PO TABS
60.0000 mg | ORAL_TABLET | Freq: Every day | ORAL | Status: DC
  Administered 2015-12-16: 60 mg via ORAL
  Filled 2015-12-15: qty 3

## 2015-12-15 NOTE — Progress Notes (Signed)
Ambulated in the hallway with walker and on oxygen at 2 LPM via nasal cannula. Saturation started at 95 %. As pt was ambulating to 20 feet, pt saturation dropped to 90%. Pt at this point had asked to be brought back to her room indicating she feels dizzy and short of breath.

## 2015-12-15 NOTE — Care Management Important Message (Signed)
Important Message  Patient Details  Name: [name] [name] MRN: [id number] Date of Birth: 01-Oct-1940   Medicare Important Message Given:  Yes    Nathen May 12/15/2015, 12:51 PM

## 2015-12-15 NOTE — Progress Notes (Signed)
Attempted to wean pt off oxygen but was unsuccessful . Oxygen saturation had dropped in the low 80"s while being weaned off. Oxygen at 2 LPM per nasal cannula resumed, oxygen saturation currently at 96% to 98 % .

## 2015-12-15 NOTE — Evaluation (Signed)
Occupational Therapy Evaluation Patient Details Name: [name] [name] MRN: [id number] DOB: August 19, 1940 Today's Date: 12/15/2015    History of Present Illness  y.o. female with medical history significant of chronic respiratory failure, oxygen requiring at baseline, COPD, paroxysmal A. fib, history of right-sided pneumothorax 3 required tube thoracostomy, coronary artery disease, hypertension who presents with complaint of shortness of breath   Clinical Impression   Pt with decline in function and safety with ADLs and ADL mobility with decreased strength, balance and endurance. Pt states that's she did  Not feel dizzy upon sitting or standing. Pt would benefit from acute OT services to address impairments to increase level of function and safety    Follow Up Recommendations  Supervision/Assistance - 24 hour;No OT follow up    Equipment Recommendations  None recommended by OT    Recommendations for Other Services       Precautions / Restrictions Precautions Precautions: Fall Precaution Comments: oxygen dependent 3 liters Restrictions Weight Bearing Restrictions: No      Mobility Bed Mobility Overal bed mobility: Needs Assistance Bed Mobility: Supine to Sit;Sit to Supine     Supine to sit: Min assist Sit to supine: Min assist   General bed mobility comments: patient required min assist to come to EOB, increased time and effort, assist via UE support to pull trunk to upright and rotate hips to EOB  Transfers Overall transfer level: Needs assistance Equipment used: 1 person hand held assist Transfers: Sit to/from [facility] Sit to Stand: Min assist Stand pivot transfers: Min assist       General transfer comment: no c/o dizziness    Balance Overall balance assessment: Needs assistance   Sitting balance-Leahy Scale: Fair       Standing balance-Leahy Scale: Poor                              ADL Overall ADL's : Needs  assistance/impaired     Grooming: Wash/dry hands;Wash/dry face;Min guard;Sitting   Upper Body Bathing: Min guard;Sitting   Lower Body Bathing: Moderate assistance   Upper Body Dressing : Min guard;Sitting   Lower Body Dressing: Moderate assistance   Toilet Transfer: Minimal assistance;BSC;Stand-pivot           Functional mobility during ADLs: Minimal assistance General ADL Comments: pt has personal care attendant 5 days/wk x 2-3 hours for bathing, home mgt, meals and some assist with LB dressing. Family assists on weekends     Vision  no change from baseline              Pertinent Vitals/Pain Pain Assessment: No/denies pain     Hand Dominance Left   Extremity/Trunk Assessment Upper Extremity Assessment Upper Extremity Assessment: Generalized weakness   Lower Extremity Assessment Lower Extremity Assessment: Defer to PT evaluation       Communication Communication Communication: HOH   Cognition Arousal/Alertness: Awake/alert Behavior During Therapy: WFL for tasks assessed/performed Overall Cognitive Status: Within Functional Limits for tasks assessed                     General Comments   pt very pleasant and cooperative                 Home Living Family/patient expects to be discharged to:: Private residence Living Arrangements: Alone Available Help at Discharge: Family;Personal care attendant;Available PRN/intermittently Type of Home: House Home Access: Level entry     Home Layout: One  level     Bathroom Shower/Tub: Teacher, early years/pre: Standard     Home Equipment: Environmental consultant - 4 wheels;Bedside commode;Tub bench   Additional Comments: wears home O2 2L /min, sleeps on couch with BSC beside it; others take her to store --she does not drive, reports HHPT and caregiver daily      Prior Functioning/Environment Level of Independence: Independent with assistive device(s)        Comments: Pt does not drive; family  assists with grocery shopping        OT Problem List: Decreased strength;Impaired balance (sitting and/or standing);Decreased activity tolerance;Decreased knowledge of use of DME or AE   OT Treatment/Interventions: Self-care/ADL training;DME and/or AE instruction;Therapeutic activities;Patient/family education    OT Goals(Current goals can be found in the care plan section) Acute Rehab OT Goals Patient Stated Goal: to go back home OT Goal Formulation: With patient Time For Goal Achievement: 12/22/15 Potential to Achieve Goals: Good ADL Goals Pt Will Perform Grooming: with supervision;with set-up;sitting Pt Will Perform Upper Body Bathing: with supervision;with set-up;sitting Pt Will Perform Lower Body Bathing: with min assist;with caregiver independent in assisting Pt Will Perform Upper Body Dressing: with supervision;with set-up;sitting Pt Will Perform Lower Body Dressing: with min assist;with caregiver independent in assisting Pt Will Transfer to Toilet: with min guard assist;with supervision;bedside commode Pt Will Perform Toileting - Clothing Manipulation and hygiene: with min guard assist;sit to/from stand;with caregiver independent in assisting Pt Will Perform Tub/Shower Transfer: with min assist;with min guard assist;tub bench;3 in 1  OT Frequency: Min 2X/week   Barriers to D/C:    no barriers, pt planing to increase hours of personal care attendant                     End of Session Equipment Utilized During Treatment: Gait belt;Other (comment) (BSC)  Activity Tolerance: Patient limited by fatigue Patient left: in bed;with bed alarm set;with call bell/phone within reach   Time: [id number] OT Time Calculation (min): 27 min Charges:  OT General Charges $OT Visit: 1 Procedure OT Evaluation $OT Eval Moderate Complexity: 1 Procedure OT Treatments $Therapeutic Activity: 8-22 mins G-Codes:    Britt Bottom 12/15/2015, 12:09 PM

## 2015-12-15 NOTE — Progress Notes (Signed)
PROGRESS NOTE  KASHYRA BONTEMPO  [id number] DOB: 1940/03/19 DOA: 12/10/2015 PCP: Leonides Sake, MD Outpatient Specialists:  Subjective: HOH, still feeling weak, slow progress. Overall has better control of heart rate. We'll try to wean off of oxygen. Diurese more. Ambulates in the hallway.  Brief Narrative:  [name] [name] is a 75 y.o. female with medical history significant of chronic respiratory failure, oxygen requiring at baseline, COPD, paroxysmal A. fib, history of right-sided pneumothorax 3 required tube thoracostomy, coronary artery disease, hypertension who presents with complaint of shortness of breath. She states that over the past 2 days, she has been short of breath and has also noticed heart palpitations starting last night. She denies any chest discomfort. She admits to cough with white sputum production. She denies any fevers, chills, rigors at home. She states that she had some nausea and vomiting last night as well but denies any symptoms at this time. She denies any abdominal discomfort but states that she has some burning with urination. Also admits to worsening peripheral edema.   Assessment & Plan:   Principal Problem:   Acute respiratory failure with hypoxia and hypercapnia (HCC) Active Problems:   COPD with acute exacerbation (HCC)   Atrial fibrillation with RVR (HCC)   Acute diastolic heart failure (HCC)   Dysuria   HLD (hyperlipidemia)   Depression   GERD (gastroesophageal reflux disease)   Acute on chronic hypoxemic and hypercapnic respiratory failure  -Multifactorial: COPD exacerbation, acute CHF, A Fib RVR -[place] O2 to maintain sat 88-92%, try to wean oxygen today. -Patient was on 6 L of oxygen, currently on 3 L of oxygen, continue to wean oxygen.  A fib RVR -Initially was on Cardizem drip, heart rate was in 120s to 104 hours, developed bradycardia with a heart rate went down to 40s. -Pradaxa (recently switched from xarelto to pradaxa)  -Currently  on Cardizem, Lopressor and digoxin. Better control.  COPD exacerbation -Presented with wheezing, shortness of breath and minimal sputum/cough. -Started on IV antibiotics and IV steroids. -Continue supportive management with bronchodilators, mucolytics, antitussives and oxygen. -No wheezing, wall taper the steroid down, discontinue IV Solu-Medrol started on prednisone.  Acute on chronic diastolic heart failure -Echo in 04/2014 with LVH, EF 65-70%. -BNP 735.3 on admission, still has significant lower extremity edema.  -Continue strict intake/output, daily weights, follow BMP daily and continue Lasix.  UTI -Has had recent UTI, treated with Keflex, UA still showing TNTC pus cells. -Culture showed Klebsiella pneumoniae, covered by Rocephin.  Constipation -Oriented 2 bowel movements since yesterday, the more laxatives today.  HLD -Lipitor  Depression -Zoloft   GERD -PPI   DVT prophylaxis: Pradaxa Code Status: Full Code Family Communication:  Disposition Plan:  Diet: Diet Heart Room service appropriate? Yes; Fluid consistency: Thin; Fluid restriction: 1500 mL Fluid  Consultants:   Cardiology  Procedures:   None  Antimicrobials:   Rocephin and Rocephin  Objective: Vitals:   12/15/15 0500 12/15/15 0600 12/15/15 0611 12/15/15 0800  BP: (!) 142/72  (!) 155/94   Pulse:   67   Resp:   18   Temp:   98 F (36.7 C)   TempSrc:   Oral   SpO2: 99% 97% 97% 98%  Weight:   58.3 kg (128 lb 9.6 oz)   Height:        Intake/Output Summary (Last 24 hours) at 12/15/15 1049 Last data filed at 12/14/15 2239  Gross per 24 hour  Intake  243 ml  Output                0 ml  Net              243 ml   Filed Weights   12/13/15 0358 12/14/15 0500 12/15/15 0611  Weight: 59.6 kg (131 lb 6.4 oz) 57.4 kg (126 lb 9.6 oz) 58.3 kg (128 lb 9.6 oz)    Examination: General exam: Appears calm and comfortable  Respiratory system: Clear to auscultation. Respiratory effort  normal. Cardiovascular system: S1 & S2 heard, RRR. No JVD, murmurs, rubs, gallops or clicks. No pedal edema. Gastrointestinal system: Abdomen is nondistended, soft and nontender. No organomegaly or masses felt. Normal bowel sounds heard. Central nervous system: Alert and oriented. No focal neurological deficits. Extremities: Symmetric 5 x 5 power. Skin: No rashes, lesions or ulcers Psychiatry: Judgement and insight appear normal. Mood & affect appropriate.   Data Reviewed: I have personally reviewed following labs and imaging studies  CBC:  Recent Labs Lab 12/10/15 1526 12/11/15 0353 12/12/15 0417 12/13/15 0359 12/14/15 0324  WBC 9.6 5.1 20.9* 14.5* 12.0*  NEUTROABS 7.9*  --   --   --   --   HGB 11.8* 12.1 11.9* 12.0 12.2  HCT 40.8 41.6 40.7 40.9 41.9  MCV 90.9 90.2 89.3 88.7 89.1  PLT 142* 147* 156 130* [id number]*   Basic Metabolic Panel:  Recent Labs Lab 12/11/15 0353 12/12/15 0417 12/13/15 0359 12/14/15 0324 12/15/15 0423  NA 136 132* 133* 135 136  K 5.7* 4.9 4.4 5.1 4.3  CL 93* 90* 90* 93* 91*  CO2 36* 32 32 36* 38*  GLUCOSE 170* 154* 155* 168* 167*  BUN 16 27* 30* 26* 26*  CREATININE 0.89 1.09* 0.86 0.78 0.68  CALCIUM 9.4 8.8* 8.6* 8.5* 8.6*   GFR: Estimated Creatinine Clearance: 50.6 mL/min (by C-G formula based on SCr of 0.68 mg/dL). Liver Function Tests:  Recent Labs Lab 12/10/15 1526  AST 19  ALT 19  ALKPHOS 67  BILITOT 0.8  PROT 6.4*  ALBUMIN 3.4*   No results for input(s): LIPASE, AMYLASE in the last 168 hours. No results for input(s): AMMONIA in the last 168 hours. Coagulation Profile:  Recent Labs Lab 12/10/15 1526  INR 1.27   Cardiac Enzymes:  Recent Labs Lab 12/10/15 1526  TROPONINI <0.03   BNP (last 3 results) No results for input(s): PROBNP in the last 8760 hours. HbA1C: No results for input(s): HGBA1C in the last 72 hours. CBG: No results for input(s): GLUCAP in the last 168 hours. Lipid Profile: No results for input(s):  CHOL, HDL, LDLCALC, TRIG, CHOLHDL, LDLDIRECT in the last 72 hours. Thyroid Function Tests:  Recent Labs  12/12/15 2004  TSH 1.444   Anemia Panel: No results for input(s): VITAMINB12, FOLATE, FERRITIN, TIBC, IRON, RETICCTPCT in the last 72 hours. Urine analysis:    Component Value Date/Time   COLORURINE YELLOW 12/11/2015 0652   APPEARANCEUR CLOUDY (A) 12/11/2015 0652   LABSPEC 1.017 12/11/2015 0652   PHURINE 6.0 12/11/2015 0652   GLUCOSEU NEGATIVE 12/11/2015 0652   HGBUR LARGE (A) 12/11/2015 0652   BILIRUBINUR NEGATIVE 12/11/2015 0652   KETONESUR 15 (A) 12/11/2015 0652   PROTEINUR NEGATIVE 12/11/2015 0652   UROBILINOGEN 1.0 11/23/2014 0249   NITRITE NEGATIVE 12/11/2015 0652   LEUKOCYTESUR LARGE (A) 12/11/2015 0652   Sepsis Labs: @LABRCNTIP (procalcitonin:4,lacticidven:4)  ) Recent Results (from the past 240 hour(s))  Culture, Urine     Status: Abnormal   Collection Time: 12/11/15  6:52 AM  Result Value Ref Range Status   Specimen Description URINE, RANDOM  Final   Special Requests NONE  Final   Culture >=100,000 COLONIES/mL KLEBSIELLA PNEUMONIAE (A)  Final   Report Status 12/13/2015 FINAL  Final   Organism ID, Bacteria KLEBSIELLA PNEUMONIAE (A)  Final      Susceptibility   Klebsiella pneumoniae - MIC*    AMPICILLIN >=32 RESISTANT Resistant     CEFAZOLIN <=4 SENSITIVE Sensitive     CEFTRIAXONE <=1 SENSITIVE Sensitive     CIPROFLOXACIN <=0.25 SENSITIVE Sensitive     GENTAMICIN <=1 SENSITIVE Sensitive     IMIPENEM <=0.25 SENSITIVE Sensitive     NITROFURANTOIN 32 SENSITIVE Sensitive     TRIMETH/SULFA >=320 RESISTANT Resistant     AMPICILLIN/SULBACTAM 4 SENSITIVE Sensitive     PIP/TAZO <=4 SENSITIVE Sensitive     Extended ESBL NEGATIVE Sensitive     * >=100,000 COLONIES/mL KLEBSIELLA PNEUMONIAE  MRSA PCR Screening     Status: None   Collection Time: 12/13/15  5:41 AM  Result Value Ref Range Status   MRSA by PCR NEGATIVE NEGATIVE Final    Comment:        The  GeneXpert MRSA Assay (FDA approved for NASAL specimens only), is one component of a comprehensive MRSA colonization surveillance program. It is not intended to diagnose MRSA infection nor to guide or monitor treatment for MRSA infections.      Invalid input(s): PROCALCITONIN, [place]   Radiology Studies: No results found.      Scheduled Meds: . atorvastatin  40 mg Oral q morning - 10a  . cefTRIAXone (ROCEPHIN)  IV  1 g Intravenous Q24H  . dabigatran  150 mg Oral BID  . digoxin  0.125 mg Oral Daily  . diltiazem  90 mg Oral Q6H  . docusate sodium  100 mg Oral Q12H  . ferrous sulfate  325 mg Oral Q breakfast  . furosemide  40 mg Intravenous Daily  . guaiFENesin  1,200 mg Oral BID  . methylPREDNISolone (SOLU-MEDROL) injection  80 mg Intravenous Q12H  . metoprolol tartrate  50 mg Oral BID  . oxybutynin  10 mg Oral QHS  . pantoprazole  40 mg Oral Daily  . polyethylene glycol  17 g Oral Daily  . sertraline  25 mg Oral QHS  . sodium chloride flush  3 mL Intravenous Q12H  . tiotropium  18 mcg Inhalation Daily   Continuous Infusions:     LOS: 5 days    Time spent: 35 minutes    Melessa Cowell A, MD [facility] Pager [phone]  If 7PM-7AM, please contact night-coverage [email or web address] Password [email or web address] 12/15/2015, 10:49 AM

## 2015-12-15 NOTE — Progress Notes (Signed)
SATURATION QUALIFICATIONS: (This note is used to comply with regulatory documentation for home oxygen)  Patient Saturations on Room Air at Rest = 83%  Patient Saturations on Room Air while Ambulating = %  Patient Saturations on 2 Liters of oxygen while Ambulating = 90%  Please briefly explain why patient needs home oxygen:

## 2015-12-15 NOTE — Progress Notes (Signed)
Physical Therapy Treatment Patient Details Name: [name] [name] MRN: [id number] DOB: October 28, 1940 Today's Date: 12/15/2015    History of Present Illness  75 y.o. female with medical history significant of chronic respiratory failure, oxygen requiring at baseline, COPD, paroxysmal A. fib, history of right-sided pneumothorax 3 required tube thoracostomy, coronary artery disease, hypertension who presents with complaint of shortness of breath    PT Comments    Patient progressing now able to ambulate, but demonstrating significant vestibular dysfunction with horizontal R beat nystagmus with gaze to R past midline.  Feel she is very high fall risk and may need SNF rehab prior to d/c home alone till vertigo is improved.  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  Will follow up and continue acute level PT prior to d/c.   Follow Up Recommendations  SNF     Equipment Recommendations  None recommended by PT    Recommendations for Other Services       Precautions / Restrictions Precautions Precautions: Fall Precaution Comments: oxygen dependent 3 liters    Mobility  Bed Mobility               General bed mobility comments: up in chair just s/p walk with nursing staff  Transfers Overall transfer level: Needs assistance Equipment used: Rolling walker (2 wheeled) Transfers: Sit to/from Stand Sit to Stand: Min assist         General transfer comment: stood for BP measurement and standing exercise  Ambulation/Gait             General Gait Details: deferred due to just after walk with nursing and wanting to eat   Stairs            Wheelchair Mobility    Modified Rankin (Stroke Patients Only)       Balance Overall balance assessment: Needs assistance         Standing balance support: Bilateral upper extremity  supported Standing balance-Leahy Scale: Poor Standing balance comment: UE support for balance                    Cognition Arousal/Alertness: Awake/alert Behavior During Therapy: WFL for tasks assessed/performed Overall Cognitive Status: Within Functional Limits for tasks assessed                      Exercises General Exercises - Lower Extremity Hip Flexion/Marching: Strengthening;Both;Standing;10 reps Heel Raises: Strengthening;Both;10 reps;Standing Mini-Sqauts: Strengthening;Both;10 reps;Standing    General Comments General comments (skin integrity, edema, etc.): BP measurements seated 126/67; standing 123/80, seated after standing 134/96; HR max 123 a-fib.  PAtient reports 1.5-2 year history of dizziness.  Feels it started when she fell and had hip fracture.  Reports it comes and goes and is worse with getting up to walk right now.  Describes as light headed, but then states things are spinning.  Noted no nystagmus in standing. but seated oculomotor assessment positive for R beat nystagmus with eyes moving to R past midline, noted saccadic smooth pursuits.  Reports has had hearing trouble for awile and currently with aide in R ear, states others are being worked on and hearing worse in L ear.  Limited vestibular assessment due to pt wanting to eat.      Pertinent Vitals/Pain Pain Assessment: No/denies pain    Home Living  Prior Function            PT Goals (current goals can now be found in the care plan section) Progress towards PT goals: Progressing toward goals    Frequency    Min 3X/week      PT Plan Current plan remains appropriate    Co-evaluation             End of Session Equipment Utilized During Treatment: Gait belt;Oxygen Activity Tolerance: Other (comment) (self limited with fatigue and wanting to eat) Patient left: in chair;with call bell/phone within reach     Time: [id number] PT Time Calculation  (min) (ACUTE ONLY): 24 min  Charges:  $Therapeutic Activity: 23-37 mins                    G CodesReginia Naas 2015/12/25, 4:49 PM  Magda Kiel, [place] December 25, 2015

## 2015-12-16 DIAGNOSIS — I480 Paroxysmal atrial fibrillation: Secondary | ICD-10-CM | POA: Diagnosis not present

## 2015-12-16 DIAGNOSIS — J9601 Acute respiratory failure with hypoxia: Secondary | ICD-10-CM | POA: Diagnosis not present

## 2015-12-16 DIAGNOSIS — E440 Moderate protein-calorie malnutrition: Secondary | ICD-10-CM | POA: Diagnosis not present

## 2015-12-16 DIAGNOSIS — N390 Urinary tract infection, site not specified: Secondary | ICD-10-CM | POA: Diagnosis not present

## 2015-12-16 DIAGNOSIS — K219 Gastro-esophageal reflux disease without esophagitis: Secondary | ICD-10-CM | POA: Diagnosis not present

## 2015-12-16 DIAGNOSIS — R1310 Dysphagia, unspecified: Secondary | ICD-10-CM | POA: Diagnosis not present

## 2015-12-16 DIAGNOSIS — J9612 Chronic respiratory failure with hypercapnia: Secondary | ICD-10-CM | POA: Diagnosis not present

## 2015-12-16 DIAGNOSIS — J9611 Chronic respiratory failure with hypoxia: Secondary | ICD-10-CM | POA: Diagnosis not present

## 2015-12-16 DIAGNOSIS — D509 Iron deficiency anemia, unspecified: Secondary | ICD-10-CM | POA: Diagnosis not present

## 2015-12-16 DIAGNOSIS — J449 Chronic obstructive pulmonary disease, unspecified: Secondary | ICD-10-CM | POA: Diagnosis not present

## 2015-12-16 DIAGNOSIS — R278 Other lack of coordination: Secondary | ICD-10-CM | POA: Diagnosis not present

## 2015-12-16 DIAGNOSIS — I5031 Acute diastolic (congestive) heart failure: Secondary | ICD-10-CM | POA: Diagnosis not present

## 2015-12-16 DIAGNOSIS — I4891 Unspecified atrial fibrillation: Secondary | ICD-10-CM | POA: Diagnosis not present

## 2015-12-16 DIAGNOSIS — I10 Essential (primary) hypertension: Secondary | ICD-10-CM | POA: Diagnosis not present

## 2015-12-16 DIAGNOSIS — I5032 Chronic diastolic (congestive) heart failure: Secondary | ICD-10-CM | POA: Diagnosis not present

## 2015-12-16 DIAGNOSIS — I2510 Atherosclerotic heart disease of native coronary artery without angina pectoris: Secondary | ICD-10-CM | POA: Diagnosis not present

## 2015-12-16 DIAGNOSIS — D72829 Elevated white blood cell count, unspecified: Secondary | ICD-10-CM | POA: Diagnosis not present

## 2015-12-16 DIAGNOSIS — B029 Zoster without complications: Secondary | ICD-10-CM | POA: Diagnosis not present

## 2015-12-16 DIAGNOSIS — D696 Thrombocytopenia, unspecified: Secondary | ICD-10-CM | POA: Diagnosis not present

## 2015-12-16 DIAGNOSIS — J9600 Acute respiratory failure, unspecified whether with hypoxia or hypercapnia: Secondary | ICD-10-CM | POA: Diagnosis not present

## 2015-12-16 DIAGNOSIS — R5381 Other malaise: Secondary | ICD-10-CM | POA: Diagnosis not present

## 2015-12-16 DIAGNOSIS — J441 Chronic obstructive pulmonary disease with (acute) exacerbation: Secondary | ICD-10-CM | POA: Diagnosis not present

## 2015-12-16 DIAGNOSIS — M6281 Muscle weakness (generalized): Secondary | ICD-10-CM | POA: Diagnosis not present

## 2015-12-16 LAB — BASIC METABOLIC PANEL
ANION GAP: 9 (ref 5–15)
BUN: 28 mg/dL — ABNORMAL HIGH (ref 6–20)
CALCIUM: 8.4 mg/dL — AB (ref 8.9–10.3)
CO2: 38 mmol/L — ABNORMAL HIGH (ref 22–32)
CREATININE: 0.75 mg/dL (ref 0.44–1.00)
Chloride: 89 mmol/L — ABNORMAL LOW (ref 101–111)
GLUCOSE: 152 mg/dL — AB (ref 65–99)
Potassium: 4.1 mmol/L (ref 3.5–5.1)
Sodium: 136 mmol/L (ref 135–145)

## 2015-12-16 MED ORDER — DILTIAZEM HCL ER COATED BEADS 360 MG PO CP24: 360.0000 mg | ORAL_CAPSULE | Freq: Every morning | ORAL | Status: DC

## 2015-12-16 MED ORDER — CEFUROXIME AXETIL 500 MG PO TABS: 500.0000 mg | ORAL_TABLET | Freq: Two times a day (BID) | ORAL | 0 refills | Status: DC

## 2015-12-16 MED ORDER — GUAIFENESIN ER 600 MG PO TB12: 1200.0000 mg | ORAL_TABLET | Freq: Two times a day (BID) | ORAL | Status: DC

## 2015-12-16 MED ORDER — PREDNISONE 10 MG PO TABS: ORAL_TABLET | ORAL | Status: DC

## 2015-12-16 MED ORDER — POLYETHYLENE GLYCOL 3350 17 G PO PACK: 17.0000 g | PACK | Freq: Every day | ORAL | 0 refills | Status: DC

## 2015-12-16 MED ORDER — DIGOXIN 125 MCG PO TABS: 0.1250 mg | ORAL_TABLET | Freq: Every day | ORAL | Status: AC

## 2015-12-16 MED ORDER — HYDROCODONE-ACETAMINOPHEN 5-325 MG PO TABS: 1.0000 | ORAL_TABLET | ORAL | 0 refills | Status: DC | PRN

## 2015-12-16 MED ORDER — AZITHROMYCIN 250 MG PO TABS: 250.0000 mg | ORAL_TABLET | Freq: Every day | ORAL | 0 refills | Status: DC

## 2015-12-16 MED ORDER — METOPROLOL TARTRATE 50 MG PO TABS: 50.0000 mg | ORAL_TABLET | Freq: Two times a day (BID) | ORAL | Status: AC

## 2015-12-16 NOTE — Progress Notes (Signed)
Pt has very poor effort on doing her Spiriva (DPI). Pt try's to blow out not in.

## 2015-12-16 NOTE — Progress Notes (Signed)
Physical Therapy Treatment Patient Details Name: [name] [name] MRN: [id number] DOB: [birth year] Today's Date: 12/16/2015    History of Present Illness  75 y.o. female with medical history significant of chronic respiratory failure, oxygen requiring at baseline, COPD, paroxysmal A. fib, history of right-sided pneumothorax 3 required tube thoracostomy, coronary artery disease, hypertension who presents with complaint of shortness of breath    PT Comments    Patient progressing with ambulation distance, but still intolerant of mobility due to elevated HR and SOB.  Dizziness seems to wax and wane, but still acts similar to a L vestibular hypofunction.  Will continue skilled PT in the acute setting prior to d/c to SNF.  Follow Up Recommendations  SNF     Equipment Recommendations  None recommended by PT    Recommendations for Other Services       Precautions / Restrictions Precautions Precautions: Fall Precaution Comments: oxygen dependent 2 liters    Mobility  Bed Mobility Overal bed mobility: Needs Assistance       Supine to sit: Min guard;HOB elevated Sit to supine: Supervision   General bed mobility comments: cues for positioning in bed to supine, assist for lines, steadying to sit  Transfers Overall transfer level: Needs assistance Equipment used: Rolling walker (2 wheeled) Transfers: Stand Pivot Transfers;Sit to/from Stand Sit to Stand: Min assist Stand pivot transfers: Min assist       General transfer comment: assist for balance/safety up to [facility], back to bed, the to stand and walk  Ambulation/Gait Ambulation/Gait assistance: Min guard;Min assist Ambulation Distance (Feet): 70 Feet Assistive device: Rolling walker (2 wheeled) Gait Pattern/deviations: Step-through pattern;Decreased stride length;Trunk flexed     General Gait Details: very fatigued on way back to room, but SpO2 WNL, HR max with ambulation 121, assist for safety/balance esp with  fatigue   Stairs            Wheelchair Mobility    Modified Rankin (Stroke Patients Only)       Balance Overall balance assessment: Needs assistance Sitting-balance support: Feet supported Sitting balance-Leahy Scale: Good     Standing balance support: Bilateral upper extremity supported Standing balance-Leahy Scale: Poor Standing balance comment: needs assist or UE support for balance                    Cognition Arousal/Alertness: Awake/alert Behavior During Therapy: WFL for tasks assessed/performed Overall Cognitive Status: Within Functional Limits for tasks assessed                      Exercises      General Comments General comments (skin integrity, edema, etc.): Performed hoizontal and vertical VOR x 40 seconds seated with near target without c/o dizziness; however still noted R beat nystagmus at times with looking around in sitting      Pertinent Vitals/Pain Pain Assessment: No/denies pain    Home Living   Living Arrangements: Alone;Other (Comment) (caregiver)                  Prior Function            PT Goals (current goals can now be found in the care plan section) Progress towards PT goals: Progressing toward goals    Frequency    Min 3X/week      PT Plan Current plan remains appropriate    Co-evaluation             End of Session Equipment Utilized During Treatment: Gait belt;Oxygen  Activity Tolerance: Patient limited by fatigue Patient left: with call bell/phone within reach;in bed;with bed alarm set     Time: [id number] PT Time Calculation (min) (ACUTE ONLY): 28 min  Charges:  $Gait Training: 8-22 mins $Therapeutic Activity: 8-22 mins                    G Codes:      Reginia Naas 15-Jan-2016, 5:23 PM  Magda Kiel, [place] 01/15/16

## 2015-12-16 NOTE — Clinical Social Work Placement (Signed)
   CLINICAL SOCIAL WORK PLACEMENT  NOTE  Date:  12/16/2015  Patient Details  Name: [name] [name] MRN: [id number] Date of Birth: 11/28/1940  Clinical Social Work is seeking post-discharge placement for this patient at the [place] level of care (*[name] will initial, date and re-position this form in  chart as items are completed):  Yes   Patient/family provided with [place] Work Department's list of facilities offering this level of care within the geographic area requested by the patient (or if unable, by the patient's family).  Yes   Patient/family informed of their freedom to choose among providers that offer the needed level of care, that participate in Medicare, Medicaid or managed care program needed by the patient, have an available bed and are willing to accept the patient.  Yes   Patient/family informed of [facility]'s ownership interest in [facility] and [facility], as well as of the fact that they are under no obligation to receive care at these facilities.  PASRR submitted to EDS on       PASRR number received on       Existing PASRR number confirmed on 12/16/15     FL2 transmitted to all facilities in geographic area requested by pt/family on 12/16/15     FL2 transmitted to all facilities within larger geographic area on       Patient informed that his/her managed care company has contracts with or will negotiate with certain facilities, including the following:        Yes   Patient/family informed of bed offers received.  Patient chooses bed at [facility]     Physician recommends and patient chooses bed at      Patient to be transferred to [facility] on 12/16/15.  Patient to be transferred to facility by Ambulance     Patient family notified on 12/16/15 of transfer.  Name of family member notified:  Patient son, [name] over the phone     PHYSICIAN       Additional Comment:    Barbette Or,  [place]

## 2015-12-16 NOTE — Clinical Social Work Note (Signed)
Clinical Social Worker facilitated patient discharge including contacting patient family and facility to confirm patient discharge plans.  Clinical information faxed to facility and family agreeable with plan.  [name] arranged ambulance transport via [facility] to [facility].  RN to call report prior to discharge.  Clinical Social Worker will sign off for now as social work intervention is no longer needed. Please consult us again if new need arises.  Jesse Jameon Deller, LCSW [phone] 

## 2015-12-16 NOTE — [place] FL2 (Signed)
[place] MEDICAID FL2 LEVEL OF CARE SCREENING TOOL     IDENTIFICATION  Patient Name: [name] [name] Birthdate: 1940/04/08 Sex: female Admission Date (Current Location): 12/10/2015  [facility] and [place] Number:  [employer] and Address:  [employer] [place]. [facility] [place] [address] [place], [place] [address]      Provider Number: [id number]  Attending Physician Name and Address:  Verlee Monte, MD  Relative Name and Phone Number:  Ardine Bjork, [phone]    Current Level of Care: Hospital Recommended Level of Care: [place] Prior Approval Number:    Date Approved/Denied:   PASRR Number: [id number]  Discharge Plan: SNF    Current Diagnoses: Patient Active Problem List   Diagnosis Date Noted  . Atrial fibrillation with RVR ([place]) 12/10/2015  . Acute respiratory failure with hypoxia and hypercapnia ([place]) 12/10/2015  . Acute diastolic heart failure ([place]) 12/10/2015  . Dysuria 12/10/2015  . HLD (hyperlipidemia) 12/10/2015  . Depression 12/10/2015  . GERD (gastroesophageal reflux disease) 12/10/2015  . Pneumothorax, right 11/20/2014  . Pneumothorax   . Recurrent spontaneous pneumothorax   . Pneumothorax on right 10/09/2014  . Pneumothorax on left 09/14/2014  . Essential hypertension   . Microcytic anemia 04/27/2014  . Anemia due to acute blood loss 08/03/2013  . COPD with acute exacerbation ([place]) 07/08/2013  . Paroxysmal atrial fibrillation ([place]) 02/03/2012  . Fracture of right hip ([place]) 01/29/2012  . Fall due to stumbling 01/29/2012  . CAD (coronary artery disease) 01/29/2012  . Osteoarthritis 01/29/2012  . Closed fracture of right distal radius 10/13/2011    Class: Acute    Orientation RESPIRATION BLADDER Height & Weight     Self, Place  O2 (Nasal Cannula 2L) Incontinent Weight: 126 lb 4.8 oz (57.3 kg) Height:  5\' 1"  (154.9 cm)  BEHAVIORAL SYMPTOMS/MOOD NEUROLOGICAL BOWEL NUTRITION STATUS      Incontinent Diet (Heart  Healthy: thin liquids)  AMBULATORY STATUS COMMUNICATION OF NEEDS Skin   Limited Assist Verbally Normal                       Personal Care Assistance Level of Assistance  Bathing, Feeding, Dressing Bathing Assistance: Limited assistance Feeding assistance: Independent Dressing Assistance: Limited assistance     Functional Limitations Info  Sight, Hearing, Speech Sight Info: Adequate Hearing Info: Impaired Speech Info: Adequate    SPECIAL CARE FACTORS FREQUENCY  PT (By licensed PT), OT (By licensed OT)     PT Frequency: 3x week OT Frequency: 3x week            Contractures Contractures Info: Not present    Additional Factors Info  Code Status, Allergies Code Status Info: full Allergies Info: Eggs Or Egg-derived Products           Current Medications (12/16/2015):  This is the current hospital active medication list Current Facility-Administered Medications  Medication Dose Route Frequency Provider Last Rate Last Dose  . acetaminophen (TYLENOL) tablet 650 mg  650 mg Oral Q6H PRN Gardiner Barefoot, NP   650 mg at 12/16/15 0514  . atorvastatin (LIPITOR) tablet 40 mg  40 mg Oral q morning - 10a Shon Millet, DO   40 mg at 12/15/15 1000  . cefTRIAXone (ROCEPHIN) 1 g in dextrose 5 % 50 mL IVPB  1 g Intravenous Q24H Verlee Monte, MD   1 g at 12/15/15 1130  . dabigatran (PRADAXA) capsule 150 mg  150 mg Oral BID Shon Millet, DO  150 mg at 12/15/15 2244  . digoxin (LANOXIN) tablet 0.125 mg  0.125 mg Oral Daily Dixie Dials, MD   0.125 mg at 12/15/15 1000  . diltiazem (CARDIZEM) tablet 90 mg  90 mg Oral Q6H Verlee Monte, MD   90 mg at 12/16/15 0511  . docusate sodium (COLACE) capsule 100 mg  100 mg Oral Q12H Jennifer Chahn-Yang Choi, DO   100 mg at 12/16/15 0847  . ferrous sulfate tablet 325 mg  325 mg Oral Q breakfast Shon Millet, DO   325 mg at 12/16/15 0847  . furosemide (LASIX) injection 40 mg  40 mg Intravenous Daily Verlee Monte, MD   40 mg at 12/15/15 1000  . furosemide (LASIX) injection 40 mg  40 mg Intravenous Once Verlee Monte, MD      . guaiFENesin (MUCINEX) 12 hr tablet 1,200 mg  1,200 mg Oral BID Verlee Monte, MD   1,200 mg at 12/15/15 2245  . ipratropium (ATROVENT) nebulizer solution 0.5 mg  0.5 mg Nebulization Q6H PRN Shon Millet, DO      . levalbuterol [facility]) nebulizer solution 0.63 mg  0.63 mg Nebulization Q6H PRN Shon Millet, DO      . metoprolol (LOPRESSOR) tablet 50 mg  50 mg Oral BID Dixie Dials, MD   50 mg at 12/15/15 2245  . oxybutynin (DITROPAN-XL) 24 hr tablet 10 mg  10 mg Oral QHS Shon Millet, DO   10 mg at 12/15/15 2247  . pantoprazole (PROTONIX) EC tablet 40 mg  40 mg Oral Daily [employer], DO   40 mg at 12/15/15 1000  . polyethylene glycol (MIRALAX / GLYCOLAX) packet 17 g  17 g Oral Daily Verlee Monte, MD   17 g at 12/13/15 1000  . predniSONE (DELTASONE) tablet 60 mg  60 mg Oral Q breakfast Verlee Monte, MD   60 mg at 12/16/15 0847  . senna-docusate (Senokot-S) tablet 2 tablet  2 tablet Oral QHS PRN Gardiner Barefoot, NP      . sertraline (ZOLOFT) tablet 25 mg  25 mg Oral QHS Shon Millet, DO   25 mg at 12/15/15 2245  . sodium chloride flush (NS) 0.9 % injection 3 mL  3 mL Intravenous Q12H Jennifer Chahn-Yang Choi, DO   3 mL at 12/15/15 1000  . tiotropium (SPIRIVA) inhalation capsule 18 mcg  18 mcg Inhalation Daily Shon Millet, DO   18 mcg at 12/16/15 0827     Discharge Medications: Please see discharge summary for a list of discharge medications.  Relevant Imaging Results:  Relevant Lab Results:   Additional Information SSN: [id number]  Alla German, LCSW

## 2015-12-16 NOTE — Discharge Summary (Signed)
Physician Discharge Summary  [name] [name] [id number] DOB: 08/31/1940 DOA: 12/10/2015  PCP: Leonides Sake, MD  Admit date: 12/10/2015 Discharge date: 12/16/2015  Admitted From: Home Disposition: SNF  Recommendations for Outpatient Follow-up:  1. Follow up with [facility] primary cardiologist in 1-2 weeks. 2. Please obtain BMP/CBC in one week 3. Continue Ceftin/azithromycin and Mucinex for 5 more days.  Home Health: NA Equipment/Devices: O2 @ 2L  Discharge Condition: Stable CODE STATUS: Full Diet recommendation: Heart Healthy  Brief/Interim Summary: [name] [name] a 76 y.o.femalewith medical history significant of chronic respiratory failure, oxygen requiring at baseline, COPD, paroxysmal A. fib, history of right-sided pneumothorax 3 required tube thoracostomy, coronary artery disease, hypertension who presents with complaint of shortness of breath. She states that over the past 2 days, she has been short of breath and has also noticed heart palpitations starting last night. She denies any chest discomfort. She admits to cough with white sputum production. She denies any fevers, chills, rigors at home. She states that she had some nausea and vomiting last night as well but denies any symptoms at this time. She denies any abdominal discomfort but states that she has some burning with urination. Also admits to worsening peripheral edema.   Discharge Diagnoses:  Principal Problem:   Acute respiratory failure with hypoxia and hypercapnia (HCC) Active Problems:   COPD with acute exacerbation (HCC)   Atrial fibrillation with RVR (HCC)   Acute diastolic heart failure (HCC)   Dysuria   HLD (hyperlipidemia)   Depression   GERD (gastroesophageal reflux disease)    Acute on chronic hypoxemic and hypercapnic respiratory failure  -Multifactorial: COPD exacerbation, acute CHF, A Fib RVR -Luce O2 to maintain sat 88-92%, try to wean oxygen today. -Patient was on 6 L of oxygen,  Discharged SNF on 2 L of oxygen.  A fib RVR -Initially was on Cardizem drip, heart rate was in 120s to 104 hours, developed bradycardia with a HR went down to 40s. -Pradaxa (recently switched from xarelto to pradaxa), CHA2DS2-VASc score of at least 3 -Cardizem dose adjusted, Toprol-XL switched to metoprolol 50 mg twice a day. -Digoxin added. Follow-up with primary cardiology in 1-2 weeks.  COPD exacerbation -Presented with wheezing, shortness of breath and minimal sputum/cough. -Started on IV antibiotics and IV steroids. -Continue supportive management with bronchodilators, mucolytics, antitussives and oxygen. -Discharged on Ceftin/azithromycin, Mucinex and prednisone taper.  Acute on chronic diastolic heart failure -Echo in 04/2014 with LVH, EF 65-70%. -BNP 735.3 on admission, still has significant lower extremity edema.  -Continue strict intake/output, daily weights. -Patient was on IV Lasix, discharge back on her home dose of Lasix of 20 mg daily.  UTI -Has had recent UTI, treated with Keflex, UA still showing TNTC pus cells. -Culture showed Klebsiella pneumoniae, covered by Rocephin while in the hospital and Ceftin on discharge.  Constipation -Constipation resolved with laxatives, prescribed MiraLAX and discharge  HLD -Lipitor  Depression -Zoloft   GERD -PPI  Discharge Instructions  Discharge Instructions    AMB Referral to [facility]    Complete by:  As directed    Reason for consult:  HF, COPD, AT FIB - restart of services, multiple admissions, [employer]   Diagnoses of:  COPD/ Pneumonia   Expected date of contact:  1-3 days (reserved for hospital discharges)   Please assign to community nurse for transition of care calls and assess for home visits. Questions please call:   Natividad Brood, RN BSN [facility] Liaison  [phone] business mobile phone  Toll free office [phone]   Diet - low sodium heart healthy    Complete by:   As directed    Increase activity slowly    Complete by:  As directed        Medication List    STOP taking these medications   cephALEXin 500 MG capsule Commonly known as:  KEFLEX   metoprolol succinate 25 MG 24 hr tablet Commonly known as:  TOPROL-XL   sulfamethoxazole-trimethoprim 800-160 MG tablet Commonly known as:  BACTRIM DS,SEPTRA DS     TAKE these medications   albuterol 108 (90 Base) MCG/ACT inhaler Commonly known as:  PROVENTIL HFA;VENTOLIN HFA Inhale 2 puffs into the lungs 4 (four) times daily as needed for wheezing or shortness of breath.   VENTOLIN HFA 108 (90 Base) MCG/ACT inhaler Generic drug:  albuterol Inhale 2 puffs into the lungs 4 (four) times daily as needed for wheezing or shortness of breath.   albuterol (2.5 MG/3ML) 0.083% nebulizer solution Commonly known as:  PROVENTIL Take 2.5 mg by nebulization every 6 (six) hours as needed for wheezing or shortness of breath.   alendronate 70 MG tablet Commonly known as:  FOSAMAX Take 70 mg by mouth once a week. Take with a full glass of water on an empty stomach.   aspirin-sod bicarb-citric acid 325 MG Tbef tablet Commonly known as:  ALKA-SELTZER Take 325 mg by mouth every 6 (six) hours as needed (for stomach).   atorvastatin 40 MG tablet Commonly known as:  LIPITOR Take 40 mg by mouth every morning.   azithromycin 250 MG tablet Commonly known as:  ZITHROMAX Take 1 tablet (250 mg total) by mouth daily.   AZO CRANBERRY PO Take 2 capsules by mouth daily.   calcitonin (salmon) 200 UNIT/ACT nasal spray Commonly known as:  MIACALCIN/FORTICAL Place 1 spray into alternate nostrils daily.   cefUROXime 500 MG tablet Commonly known as:  CEFTIN Take 1 tablet (500 mg total) by mouth 2 (two) times daily with a meal.   cyanocobalamin 500 MCG tablet Take 500 mcg by mouth daily.   dabigatran 150 MG Caps capsule Commonly known as:  PRADAXA Take 150 mg by mouth 2 (two) times daily.   digoxin 0.125 MG  tablet Commonly known as:  LANOXIN Take 1 tablet (0.125 mg total) by mouth daily. Start taking on:  12/17/2015   diltiazem 360 MG 24 hr capsule Commonly known as:  CARDIZEM CD Take 1 capsule (360 mg total) by mouth every morning. What changed:  medication strength  how much to take   docusate sodium 100 MG capsule Commonly known as:  COLACE Take 1 capsule (100 mg total) by mouth every 12 (twelve) hours.   ferrous sulfate 325 (65 FE) MG tablet Take 325 mg by mouth daily with breakfast.   Fish Oil 1000 MG Caps Take 1,000 mg by mouth daily.   fluticasone 50 MCG/ACT nasal spray Commonly known as:  FLONASE Place 1 spray into both nostrils daily as needed for allergies or rhinitis.   furosemide 20 MG tablet Commonly known as:  LASIX Take 1 tablet (20 mg total) by mouth daily.   guaiFENesin 600 MG 12 hr tablet Commonly known as:  MUCINEX Take 2 tablets (1,200 mg total) by mouth 2 (two) times daily.   HYDROcodone-acetaminophen 5-325 MG tablet Commonly known as:  NORCO/VICODIN Take 1 tablet by mouth every 4 (four) hours as needed.   metoprolol 50 MG tablet Commonly known as:  LOPRESSOR Take 1 tablet (50 mg total) by mouth  2 (two) times daily.   oxybutynin 5 MG 24 hr tablet Commonly known as:  DITROPAN-XL Take 10 mg by mouth at bedtime.   pantoprazole 40 MG tablet Commonly known as:  PROTONIX Take 1 tablet (40 mg total) by mouth daily.   polyethylene glycol packet Commonly known as:  MIRALAX / GLYCOLAX Take 17 g by mouth daily. Start taking on:  12/17/2015   predniSONE 10 MG tablet Commonly known as:  DELTASONE Take 6-5-4-3-2-1 PO daily till gone What changed:  medication strength  how much to take  how to take this  when to take this  additional instructions   sertraline 25 MG tablet Commonly known as:  ZOLOFT Take 1 tablet (25 mg total) by mouth at bedtime.   tiotropium 18 MCG inhalation capsule Commonly known as:  SPIRIVA Place 1 capsule (18 mcg  total) into inhaler and inhale daily.   tiZANidine 2 MG tablet Commonly known as:  ZANAFLEX Take 2 mg by mouth at bedtime.       Allergies  Allergen Reactions  . Eggs Or Egg-Derived Products Other (See Comments)    Dislikes eggs, every day. States no allergy    Consultations:  Cardiology, Dr Doylene Canard   Procedures/Studies: Dg Bone Density  Result Date: 11/20/2015 EXAM: DUAL X-RAY ABSORPTIOMETRY (DXA) FOR BONE MINERAL DENSITY IMPRESSION: Referring Physician:  Leonides Sake PATIENT: Name: Paizly, Mcowen Patient ID: [id number] Birth Date: 1940/12/27 Height: 65.0 in. Sex: Female Measured: 11/20/2015 Weight: 139.5 lbs. Indications: Advanced Age, Bilateral Ovariectomy (65.51), Caucasian, Estrogen Deficient, Glucocorticoids (Chronic) (255.41), History of Fracture (Adult) (V15.51), Hysterectomy, Postmenopausal, Protonix Fractures: Rib, Right hip, Shoulder, vertebrae, Wrist Treatments: Calcium (E943.0), Vitamin D (E933.5) ASSESSMENT: The BMD measured at Forearm Radius 33% is 0.505 g/cm2 with a T-score of -4.3. This patient is considered osteoporotic according to [facility]) criteria. Only the forearm was imaged due to patient stating she can not breathe when laying on table with head elevated. Site Region Measured Date Measured Age YA BMD Significant CHANGE T-score Left Forearm Radius 33% 11/20/2015 75.4 -4.3 0.505 g/cm2 World Health Organization [facility]) criteria for post-menopausal, Caucasian Women: Normal       T-score at or above -1 SD Osteopenia   T-score between -1 and -2.5 SD Osteoporosis T-score at or below -2.5 SD RECOMMENDATION: [place] recommends that FDA-approved medical therapies be considered in postmenopausal women and men age 1 or older with a: 1. Hip or vertebral (clinical or morphometric) fracture. 2. T-score of <-2.5 at the spine or hip. 3. Ten-year fracture probability by FRAX of 3% or greater for hip fracture or 20% or greater for major  osteoporotic fracture. All treatment decisions require clinical judgment and consideration of individual patient factors, including patient preferences, co-morbidities, previous drug use, risk factors not captured in the FRAX model (e.g. falls, vitamin D deficiency, increased bone turnover, interval significant decline in bone density) and possible under - or over-estimation of fracture risk by FRAX. All patients should ensure an adequate intake of dietary calcium (1200 mg/d) and vitamin D (800 IU daily) unless contraindicated. FOLLOW-UP: People with diagnosed cases of osteoporosis or at high risk for fracture should have regular bone mineral density tests. For patients eligible for Medicare, routine testing is allowed once every 2 years. The testing frequency can be increased to one year for patients who have rapidly progressing disease, those who are receiving or discontinuing medical therapy to restore bone mass, or have additional risk factors. I have reviewed this report, and agree with  the above findings. [facility] Electronically Signed   By: Lajean Manes M.D.   On: 11/20/2015 15:30   Dg Chest Portable 1 View  Result Date: 12/10/2015 CLINICAL DATA:  Nausea and shortness of breath for the last 2 days. Weakness. EXAM: PORTABLE CHEST 1 VIEW COMPARISON:  09/23/2015 FINDINGS: Severe emphysema with biapical scarring and postoperative the wedge resection clips at the right lung apex. The degree of apical volume loss/ scarring appears mildly increased from the prior exam although some of this may be positional. Extensive kyphosis. Atherosclerotic aortic arch and tortuosity. Newly indistinct hemidiaphragms partially from projection but also potentially from bibasilar airspace opacities. Heart size felt to be within normal limits. Scattered scarring in the lungs. Chronic deformity from old fracture the right proximal humerus, healed. Bilateral airway thickening noted. IMPRESSION: 1. Bibasilar airspace  opacities potentially from atelectasis or pneumonia. 2. Extensive biapical pleuroparenchymal scarring with some postoperative findings at the right lung apex. 3. Atherosclerotic aortic arch. 4. Severe emphysema. 5. Increased bilateral airway thickening suggesting inflammation/bronchitis. Electronically Signed   By: Van Clines M.D.   On: 12/10/2015 15:09   Dg Abd 2 Views  Result Date: 12/12/2015 CLINICAL DATA:  Abdominal pain for a few days. History of hypertension, COPD, asthma, shortness of breath. EXAM: ABDOMEN - 2 VIEW COMPARISON:  Chest 09/23/2015 FINDINGS: Gas and stool throughout the colon. No small or large bowel distention. No free intra-abdominal air. No abnormal air-fluid levels. No radiopaque stones. Surgical clips in the right upper quadrant. Vascular calcifications. Degenerative changes in the spine. Right hip hemiarthroplasty. Small pleural effusions in the lung bases. IMPRESSION: Nonobstructive bowel gas pattern with diffusely stool-filled colon. Small bilateral pleural effusions. Electronically Signed   By: Lucienne Capers M.D.   On: 12/12/2015 22:54   Mm Digital Screening Bilateral  Result Date: 11/21/2015 CLINICAL DATA:  Screening. EXAM: DIGITAL SCREENING BILATERAL MAMMOGRAM WITH CAD COMPARISON:  None. ACR Breast Density Category b: There are scattered areas of fibroglandular density. FINDINGS: There are no findings suspicious for malignancy. Images were processed with CAD. IMPRESSION: No mammographic evidence of malignancy. A result letter of this screening mammogram will be mailed directly to the patient. RECOMMENDATION: Screening mammogram in one year. (Code:SM-B-01Y) BI-RADS CATEGORY  1: Negative. Electronically Signed   By: Everlean Alstrom M.D.   On: 11/21/2015 10:19    (Echo, Carotid, EGD, Colonoscopy, ERCP)    Subjective:   Discharge Exam: Vitals:   12/15/15 2107 12/16/15 0402  BP: 129/70 (!) 136/58  Pulse: 85 (!) 56  Resp: 16 20  Temp: 98.5 F (36.9 C) 97.6  F (36.4 C)   Vitals:   12/15/15 1100 12/15/15 1454 12/15/15 2107 12/16/15 0402  BP: (!) 150/70 136/84 129/70 (!) 136/58  Pulse:  89 85 (!) 56  Resp:  18 16 20   Temp:  98.1 F (36.7 C) 98.5 F (36.9 C) 97.6 F (36.4 C)  TempSrc:  Oral Oral Oral  SpO2:  99% 98% 95%  Weight:    57.3 kg (126 lb 4.8 oz)  Height:        General: Pt is alert, awake, not in acute distress Cardiovascular: RRR, S1/S2 +, no rubs, no gallops Respiratory: CTA bilaterally, no wheezing, no rhonchi Abdominal: Soft, NT, ND, bowel sounds + Extremities: no edema, no cyanosis    The results of significant diagnostics from this hospitalization (including imaging, microbiology, ancillary and laboratory) are listed below for reference.     Microbiology: Recent Results (from the past 240 hour(s))  Culture, Urine  Status: Abnormal   Collection Time: 12/11/15  6:52 AM  Result Value Ref Range Status   Specimen Description URINE, RANDOM  Final   Special Requests NONE  Final   Culture >=100,000 COLONIES/mL KLEBSIELLA PNEUMONIAE (A)  Final   Report Status 12/13/2015 FINAL  Final   Organism ID, Bacteria KLEBSIELLA PNEUMONIAE (A)  Final      Susceptibility   Klebsiella pneumoniae - MIC*    AMPICILLIN >=32 RESISTANT Resistant     CEFAZOLIN <=4 SENSITIVE Sensitive     CEFTRIAXONE <=1 SENSITIVE Sensitive     CIPROFLOXACIN <=0.25 SENSITIVE Sensitive     GENTAMICIN <=1 SENSITIVE Sensitive     IMIPENEM <=0.25 SENSITIVE Sensitive     NITROFURANTOIN 32 SENSITIVE Sensitive     TRIMETH/SULFA >=320 RESISTANT Resistant     AMPICILLIN/SULBACTAM 4 SENSITIVE Sensitive     PIP/TAZO <=4 SENSITIVE Sensitive     Extended ESBL NEGATIVE Sensitive     * >=100,000 COLONIES/mL KLEBSIELLA PNEUMONIAE  MRSA PCR Screening     Status: None   Collection Time: 12/13/15  5:41 AM  Result Value Ref Range Status   MRSA by PCR NEGATIVE NEGATIVE Final    Comment:        The GeneXpert MRSA Assay (FDA approved for NASAL specimens only), is  one component of a comprehensive MRSA colonization surveillance program. It is not intended to diagnose MRSA infection nor to guide or monitor treatment for MRSA infections.      Labs: BNP (last 3 results)  Recent Labs  08/23/15 1051 08/30/15 0825 12/10/15 1526  BNP 135.4* 32.7 [id number]*   Basic Metabolic Panel:  Recent Labs Lab 12/12/15 0417 12/13/15 0359 12/14/15 0324 12/15/15 0423 12/16/15 0344  NA 132* 133* 135 136 136  K 4.9 4.4 5.1 4.3 4.1  CL 90* 90* 93* 91* 89*  CO2 32 32 36* 38* 38*  GLUCOSE 154* 155* 168* 167* 152*  BUN 27* 30* 26* 26* 28*  CREATININE 1.09* 0.86 0.78 0.68 0.75  CALCIUM 8.8* 8.6* 8.5* 8.6* 8.4*   Liver Function Tests:  Recent Labs Lab 12/10/15 1526  AST 19  ALT 19  ALKPHOS 67  BILITOT 0.8  PROT 6.4*  ALBUMIN 3.4*   No results for input(s): LIPASE, AMYLASE in the last 168 hours. No results for input(s): AMMONIA in the last 168 hours. CBC:  Recent Labs Lab 12/10/15 1526 12/11/15 0353 12/12/15 0417 12/13/15 0359 12/14/15 0324  WBC 9.6 5.1 20.9* 14.5* 12.0*  NEUTROABS 7.9*  --   --   --   --   HGB 11.8* 12.1 11.9* 12.0 12.2  HCT 40.8 41.6 40.7 40.9 41.9  MCV 90.9 90.2 89.3 88.7 89.1  PLT 142* 147* 156 130* 144*   Cardiac Enzymes:  Recent Labs Lab 12/10/15 1526  TROPONINI <0.03   BNP: Invalid input(s): POCBNP CBG: No results for input(s): GLUCAP in the last 168 hours. D-Dimer No results for input(s): DDIMER in the last 72 hours. Hgb A1c No results for input(s): HGBA1C in the last 72 hours. Lipid Profile No results for input(s): CHOL, HDL, LDLCALC, TRIG, CHOLHDL, LDLDIRECT in the last 72 hours. Thyroid function studies No results for input(s): TSH, T4TOTAL, T3FREE, THYROIDAB in the last 72 hours.  Invalid input(s): FREET3 Anemia work up No results for input(s): VITAMINB12, FOLATE, FERRITIN, TIBC, IRON, RETICCTPCT in the last 72 hours. Urinalysis    Component Value Date/Time   COLORURINE YELLOW 12/11/2015  0652   APPEARANCEUR CLOUDY (A) 12/11/2015 0652   LABSPEC 1.017 12/11/2015 [id number]  PHURINE 6.0 12/11/2015 0652   GLUCOSEU NEGATIVE 12/11/2015 0652   HGBUR LARGE (A) 12/11/2015 0652   BILIRUBINUR NEGATIVE 12/11/2015 0652   KETONESUR 15 (A) 12/11/2015 0652   PROTEINUR NEGATIVE 12/11/2015 0652   UROBILINOGEN 1.0 11/23/2014 0249   NITRITE NEGATIVE 12/11/2015 0652   LEUKOCYTESUR LARGE (A) 12/11/2015 0652   Sepsis Labs Invalid input(s): PROCALCITONIN,  WBC,  LACTICIDVEN Microbiology Recent Results (from the past 240 hour(s))  Culture, Urine     Status: Abnormal   Collection Time: 12/11/15  6:52 AM  Result Value Ref Range Status   Specimen Description URINE, RANDOM  Final   Special Requests NONE  Final   Culture >=100,000 COLONIES/mL KLEBSIELLA PNEUMONIAE (A)  Final   Report Status 12/13/2015 FINAL  Final   Organism ID, Bacteria KLEBSIELLA PNEUMONIAE (A)  Final      Susceptibility   Klebsiella pneumoniae - MIC*    AMPICILLIN >=32 RESISTANT Resistant     CEFAZOLIN <=4 SENSITIVE Sensitive     CEFTRIAXONE <=1 SENSITIVE Sensitive     CIPROFLOXACIN <=0.25 SENSITIVE Sensitive     GENTAMICIN <=1 SENSITIVE Sensitive     IMIPENEM <=0.25 SENSITIVE Sensitive     NITROFURANTOIN 32 SENSITIVE Sensitive     TRIMETH/SULFA >=320 RESISTANT Resistant     AMPICILLIN/SULBACTAM 4 SENSITIVE Sensitive     PIP/TAZO <=4 SENSITIVE Sensitive     Extended ESBL NEGATIVE Sensitive     * >=100,000 COLONIES/mL KLEBSIELLA PNEUMONIAE  MRSA PCR Screening     Status: None   Collection Time: 12/13/15  5:41 AM  Result Value Ref Range Status   MRSA by PCR NEGATIVE NEGATIVE Final    Comment:        The GeneXpert MRSA Assay (FDA approved for NASAL specimens only), is one component of a comprehensive MRSA colonization surveillance program. It is not intended to diagnose MRSA infection nor to guide or monitor treatment for MRSA infections.      Time coordinating discharge: Over 30  minutes  SIGNED:   Birdie Hopes, MD  [facility] 12/16/2015, 11:22 AM Pager   If 7PM-7AM, please contact night-coverage [email or web address] Password [email or web address]

## 2015-12-16 NOTE — Clinical Social Work Note (Signed)
Clinical Social Work Assessment  Patient Details  Name: [name] [name] MRN: [id number] Date of Birth: 25-Jan-1941  Date of referral:  12/16/15               Reason for consult:  Facility Placement                Permission sought to share information with:  Family Supports Permission granted to share information::  Yes, Verbal Permission Granted  Name::     Ardine Bjork  Agency::     Relationship::  Son  Contact Information:  [phone]  Housing/Transportation Living arrangements for the past 2 months:  [place] of Information:  Patient, Adult Children Patient Interpreter Needed:  None Criminal Activity/Legal Involvement Pertinent to Current Situation/Hospitalization:  No - Comment as needed Significant Relationships:  Adult Children Ardine Bjork, son) Lives with:  Self Do you feel safe going back to the place where you live?  Yes Need for family participation in patient care:  Yes (Comment)  Care giving concerns:  Patient nor patients son express any concern at this time.    Social Worker assessment / plan:  [name] spoke with patient and patients son at beside. Pt lived at home prior to discharge. [name] spoke with pt's son about SNF search. Pt's son request that the pt go to a facility in [place]. Pt's son suggest that [facility] closest to his residence and would prefers pt be placed there. [name] to initiate referral to [facility] and alternative facilities in the [place] areas. [name] to follow up with bed offers once available. [name] remains available for support and assist with  discharge needs.  Employment status:  Unemployed Nurse, adult PT Recommendations:  [place] / Referral to community resources:  [place]  Patient/Family's Response to care:  Pt and pt's son are understanding of [name] role and appreciative of support. Pt and pt's son hopeful for continued recovery.    Patient/Family's Understanding of and Emotional Response to Diagnosis, Current Treatment, and Prognosis:  Pt and pt's son aware of pt's physical limitations and prepare to have family assist following discharge.  Emotional Assessment Appearance:  Appears stated age Attitude/Demeanor/Rapport:   (appropriate) Affect (typically observed):  Appropriate Orientation:  Oriented to Self, Oriented to Place Alcohol / Substance use:  Not Applicable Psych involvement (Current and /or in the community):  No (Comment)  Discharge Needs  Concerns to be addressed:  No discharge needs identified Readmission within the last 30 days:  Yes Current discharge risk:  Dependent with Mobility Barriers to Discharge:  Continued Medical Work up   [employer], [place]

## 2015-12-17 ENCOUNTER — Other Ambulatory Visit: Payer: Self-pay

## 2015-12-17 DIAGNOSIS — J441 Chronic obstructive pulmonary disease with (acute) exacerbation: Secondary | ICD-10-CM

## 2015-12-18 ENCOUNTER — Encounter: Payer: Self-pay | Admitting: Internal Medicine

## 2015-12-18 ENCOUNTER — Non-Acute Institutional Stay (SKILLED_NURSING_FACILITY): Payer: Commercial Managed Care - HMO | Admitting: Internal Medicine

## 2015-12-18 DIAGNOSIS — N390 Urinary tract infection, site not specified: Secondary | ICD-10-CM | POA: Diagnosis not present

## 2015-12-18 DIAGNOSIS — K219 Gastro-esophageal reflux disease without esophagitis: Secondary | ICD-10-CM

## 2015-12-18 DIAGNOSIS — F329 Major depressive disorder, single episode, unspecified: Secondary | ICD-10-CM | POA: Diagnosis not present

## 2015-12-18 DIAGNOSIS — S72001S Fracture of unspecified part of neck of right femur, sequela: Secondary | ICD-10-CM

## 2015-12-18 DIAGNOSIS — E785 Hyperlipidemia, unspecified: Secondary | ICD-10-CM | POA: Diagnosis not present

## 2015-12-18 DIAGNOSIS — K5900 Constipation, unspecified: Secondary | ICD-10-CM

## 2015-12-18 DIAGNOSIS — D509 Iron deficiency anemia, unspecified: Secondary | ICD-10-CM

## 2015-12-18 DIAGNOSIS — R1310 Dysphagia, unspecified: Secondary | ICD-10-CM

## 2015-12-18 DIAGNOSIS — J441 Chronic obstructive pulmonary disease with (acute) exacerbation: Secondary | ICD-10-CM | POA: Diagnosis not present

## 2015-12-18 DIAGNOSIS — B9689 Other specified bacterial agents as the cause of diseases classified elsewhere: Secondary | ICD-10-CM

## 2015-12-18 DIAGNOSIS — N3281 Overactive bladder: Secondary | ICD-10-CM | POA: Diagnosis not present

## 2015-12-18 DIAGNOSIS — R5381 Other malaise: Secondary | ICD-10-CM | POA: Diagnosis not present

## 2015-12-18 DIAGNOSIS — I480 Paroxysmal atrial fibrillation: Secondary | ICD-10-CM | POA: Diagnosis not present

## 2015-12-18 DIAGNOSIS — F32A Depression, unspecified: Secondary | ICD-10-CM

## 2015-12-18 DIAGNOSIS — D696 Thrombocytopenia, unspecified: Secondary | ICD-10-CM

## 2015-12-18 DIAGNOSIS — I5032 Chronic diastolic (congestive) heart failure: Secondary | ICD-10-CM | POA: Diagnosis not present

## 2015-12-18 DIAGNOSIS — B961 Klebsiella pneumoniae [K. pneumoniae] as the cause of diseases classified elsewhere: Secondary | ICD-10-CM

## 2015-12-18 MED ORDER — DILTIAZEM HCL ER COATED BEADS 360 MG PO CP24: 360.0000 mg | ORAL_CAPSULE | Freq: Every morning | ORAL | Status: DC

## 2015-12-18 NOTE — Progress Notes (Signed)
LOCATION[facility]  PCP: Leonides Sake, MD   Code Status: Full Code  Goals of care: Advanced Directive information Advanced Directives 12/10/2015  Does patient have an advance directive? No  Type of Advance Directive -  Does patient want to make changes to advanced directive? -  Copy of advanced directive(s) in chart? -  Would patient like information on creating an advanced directive? -  Pre-existing out of facility DNR order (yellow form or pink MOST form) -       Extended Emergency Contact Information Primary Emergency Contact: Apolinar Junes, [place] [address] [name] [employer] Phone: [phone] Relation: Son Secondary Emergency Contact: [name] [place] Phone: [phone] Relation: Relative   Allergies  Allergen Reactions  . Eggs Or Egg-Derived Products Other (See Comments)    Dislikes eggs, every day. States no allergy    Chief Complaint  Patient presents with  . New Admit To SNF    New Admission Visit     HPI:  Patient is a 75 y.o. female seen today for short term rehabilitation post hospital admission from 12/10/15-12/16/15 with acute on chronic respiratory failure. She had copd exacerbation, acute chf exacerbation and afib with RVR. She required iv antibiotics, iv steroid, iv diuresis and iv cardizem drip. She was also treated for klebsiella UTI. She is seen in her room today.   Review of Systems:  Constitutional: Negative for fever, chills, diaphoresis. Feels weak and tired. HENT: Negative for congestion, nasal discharge. Positive for headaches. She has difficulty swallowing pills and takes it with apple sauce at home Eyes: Negative for blurred vision, double vision and discharge.  Respiratory: Negative for shortness of breath and wheezing. Positive for occasional cough.   Cardiovascular: Negative for chest pain, palpitations, leg swelling.  Gastrointestinal: Negative for heartburn, nausea,  vomiting, abdominal pain. Last bowel movement was 2 days ago. Genitourinary: Negative for flank pain. Positive for burning with urination.  Musculoskeletal: Negative for back pain, fall in the facility.  Skin: Negative for itching, rash.  Neurological: Positive for dizziness with change of position. Psychiatric/Behavioral: Negative for depression   Past Medical History:  Diagnosis Date  . Anemia   . Arthritis    "back" (11/21/2014)  . Asthma   . Blood dyscrasia    hx blood clotts  . Chronic bronchitis ([place])   . Chronic kidney disease   . Chronic lower back pain   . 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 GERD (gastroesophageal reflux disease)   . [id number])    "a few times/year" (11/21/2014)  . History of blood transfusion X 2   "related to blood thinner I was taking"  . Hyperlipidemia   . Hypertension   . Migraine    "weekly probably" (11/21/2014)  . On home oxygen therapy    "3L; 24/7" (11/21/2014)  . Pneumonia    hx pneumonia   . Pneumothorax on right 09/2014  . Pulmonary embolism ([place])   . Shortness of breath   . Urinary incontinence    Past Surgical History:  Procedure Laterality Date  . ABDOMINAL HYSTERECTOMY    . CATARACT EXTRACTION W/ INTRAOCULAR LENS  IMPLANT, BILATERAL Bilateral   . CYSTOSCOPY W/ RETROGRADES Bilateral 06/11/2014   Procedure: CYSTOSCOPY WITH RETROGRADE PYELOGRAM;  Surgeon: [name], MD;  Location: [facility];  Service: Urology;  Laterality: Bilateral;  . FRACTURE SURGERY    . HARDWARE REMOVAL  01/29/2012   Procedure: HARDWARE REMOVAL;  Surgeon: [name], MD;  Location: [facility];  Service: Orthopedics;  Laterality: Right;  . HEMIARTHROPLASTY SHOULDER FRACTURE Right   . HIP ARTHROPLASTY  01/29/2012   Procedure: ARTHROPLASTY BIPOLAR HIP;  Surgeon: [name], MD;  Location: [facility];  Service: Orthopedics;  Laterality: Right;  . LAPAROSCOPIC CHOLECYSTECTOMY    . ORIF WRIST FRACTURE  10/13/2011   Procedure: OPEN  REDUCTION INTERNAL FIXATION (ORIF) WRIST FRACTURE;  Surgeon: [name], MD;  Location: [facility];  Service: Orthopedics;  Laterality: Right;  Open Reduction Internal Fixation Right Distal Radius   . TRANSURETHRAL RESECTION OF BLADDER TUMOR N/A 06/11/2014   Procedure: TRANSURETHRAL RESECTION OF BLADDER TUMOR (TURBT);  Surgeon: [name], MD;  Location[facility];  Service: Urology;  Laterality: N/A;  . TUBAL LIGATION     Social History:   reports that she quit smoking about 22 months ago. Her smoking use included Cigarettes. She has a 56.00 pack-year smoking history. She has never used smokeless tobacco. She reports that she does not drink alcohol or use drugs.  Family History  Problem Relation Age of Onset  . Cancer - Lung Mother   . Coronary artery disease Mother   . Coronary artery disease Sister   . Coronary artery disease Brother     Medications:   Medication List       Accurate as of 12/18/15 12:02 PM. Always use your most recent med list.          albuterol 108 (90 Base) MCG/ACT inhaler Commonly known as:  PROVENTIL HFA;VENTOLIN HFA Inhale 2 puffs into the lungs 4 (four) times daily as needed for wheezing or shortness of breath.   albuterol (2.5 MG/3ML) 0.083% nebulizer solution Commonly known as:  PROVENTIL Take 2.5 mg by nebulization every 6 (six) hours as needed for wheezing or shortness of breath.   alendronate 70 MG tablet Commonly known as:  FOSAMAX Take 70 mg by mouth once a week. Take with a full glass of water on an empty stomach.   aspirin-sod bicarb-citric acid 325 MG Tbef tablet Commonly known as:  ALKA-SELTZER Take 325 mg by mouth every 6 (six) hours as needed (for stomach).   atorvastatin 40 MG tablet Commonly known as:  LIPITOR Take 40 mg by mouth every morning.   azithromycin 250 MG tablet Commonly known as:  ZITHROMAX Take 1 tablet (250 mg total) by mouth daily.   AZO CRANBERRY PO Take 2 capsules by mouth daily.   calcitonin (salmon) 200  UNIT/ACT nasal spray Commonly known as:  MIACALCIN/FORTICAL Place 1 spray into alternate nostrils daily.   cefUROXime 500 MG tablet Commonly known as:  CEFTIN Take 1 tablet (500 mg total) by mouth 2 (two) times daily with a meal.   cyanocobalamin 500 MCG tablet Take 500 mcg by mouth daily.   dabigatran 150 MG Caps capsule Commonly known as:  PRADAXA Take 150 mg by mouth 2 (two) times daily.   digoxin 0.125 MG tablet Commonly known as:  LANOXIN Take 1 tablet (0.125 mg total) by mouth daily.   docusate sodium 100 MG capsule Commonly known as:  COLACE Take 1 capsule (100 mg total) by mouth every 12 (twelve) hours.   ferrous sulfate 325 (65 FE) MG tablet Take 325 mg by mouth daily with breakfast.   Fish Oil 1000 MG Caps Take 1,000 mg by mouth daily.   fluticasone 50 MCG/ACT nasal spray Commonly known as:  FLONASE Place 1 spray into both  nostrils daily as needed for allergies or rhinitis.   furosemide 20 MG tablet Commonly known as:  LASIX Take 1 tablet (20 mg total) by mouth daily.   guaiFENesin 600 MG 12 hr tablet Commonly known as:  MUCINEX Take 2 tablets (1,200 mg total) by mouth 2 (two) times daily.   HYDROcodone-acetaminophen 5-325 MG tablet Commonly known as:  NORCO/VICODIN Take 1 tablet by mouth every 4 (four) hours as needed.   metoprolol 50 MG tablet Commonly known as:  LOPRESSOR Take 1 tablet (50 mg total) by mouth 2 (two) times daily.   oxybutynin 5 MG 24 hr tablet Commonly known as:  DITROPAN-XL Take 10 mg by mouth at bedtime.   pantoprazole 40 MG tablet Commonly known as:  PROTONIX Take 1 tablet (40 mg total) by mouth daily.   polyethylene glycol packet Commonly known as:  MIRALAX / GLYCOLAX Take 17 g by mouth daily.   predniSONE 10 MG tablet Commonly known as:  DELTASONE Take 6-5-4-3-2-1 PO daily till gone   sertraline 25 MG tablet Commonly known as:  ZOLOFT Take 1 tablet (25 mg total) by mouth at bedtime.   tiotropium 18 MCG inhalation  capsule Commonly known as:  SPIRIVA Place 1 capsule (18 mcg total) into inhaler and inhale daily.   tiZANidine 2 MG tablet Commonly known as:  ZANAFLEX Take 2 mg by mouth at bedtime.       Immunizations: Immunization History  Administered Date(s) Administered  . Influenza-Unspecified 11/13/2013  . PPD Test 12/16/2015     Physical Exam:  Vitals:   12/18/15 1147  BP: 115/76  Pulse: 86  Resp: 18  Temp: 98.4 F (36.9 C)  TempSrc: Oral  SpO2: 95%  Weight: 126 lb (57.2 kg)  Height: 5\' 1"  (1.549 m)   Body mass index is 23.81 kg/m.  General- elderly female, well built, in no acute distress Head- normocephalic, atraumatic, hearing aid present Nose- no nasal discharge Throat- moist mucus membrane, edentulous Eyes- PERRLA, EOMI, no pallor, no icterus, no discharge, normal conjunctiva, normal sclera Neck- no cervical lymphadenopathy Cardiovascular- irregular heart rate, no murmur, no leg edema Respiratory- bilateral clear to auscultation, no wheeze, no rhonchi, no crackles, no use of accessory muscles, on o2 by nasal canula Abdomen- bowel sounds present, soft, non tender Musculoskeletal- able to move all 4 extremities, generalized weakness Neurological- alert and oriented to person and time Skin- warm and dry, easy bruising Psychiatry- normal mood and affect    Labs reviewed: Basic Metabolic Panel:  Recent Labs  12/14/15 0324 12/15/15 0423 12/16/15 0344  NA 135 136 136  K 5.1 4.3 4.1  CL 93* 91* 89*  CO2 36* 38* 38*  GLUCOSE 168* 167* 152*  BUN 26* 26* 28*  CREATININE 0.78 0.68 0.75  CALCIUM 8.5* 8.6* 8.4*   Liver Function Tests:  Recent Labs  08/30/15 0825 12/10/15 1526  AST 21 19  ALT 29 19  ALKPHOS 79 67  BILITOT 0.4 0.8  PROT 6.7 6.4*  ALBUMIN 3.5 3.4*    Recent Labs  08/30/15 0825  LIPASE 23   No results for input(s): AMMONIA in the last 8760 hours. CBC:  Recent Labs  08/23/15 1051 08/30/15 0825  12/10/15 1526  12/12/15 0417  12/13/15 0359 12/14/15 0324  WBC 6.2 18.8*  < > 9.6  < > 20.9* 14.5* 12.0*  NEUTROABS 4.3 14.8*  --  7.9*  --   --   --   --   HGB 11.4* 13.4  < > 11.8*  < >  11.9* 12.0 12.2  HCT 37.8 43.7  < > 40.8  < > 40.7 40.9 41.9  MCV 90.9 88.5  < > 90.9  < > 89.3 88.7 89.1  PLT 113* 251  < > 142*  < > 156 130* 144*  < > = values in this interval not displayed. Cardiac Enzymes:  Recent Labs  09/23/15 1300 12/10/15 1526  TROPONINI <0.03 <0.03    Radiological Exams: Dg Bone Density  Result Date: 11/20/2015 EXAM: DUAL X-RAY ABSORPTIOMETRY (DXA) FOR BONE MINERAL DENSITY IMPRESSION: Referring Physician:  Leonides Sake PATIENT: Name: Alvera, Ransier Patient ID: [id number] Birth Date: October 04, 1940 Height: 65.0 in. Sex: Female Measured: 11/20/2015 Weight: 139.5 lbs. Indications: Advanced Age, Bilateral Ovariectomy (65.51), Caucasian, Estrogen Deficient, Glucocorticoids (Chronic) (255.41), History of Fracture (Adult) (V15.51), Hysterectomy, Postmenopausal, Protonix Fractures: Rib, Right hip, Shoulder, vertebrae, Wrist Treatments: Calcium (E943.0), Vitamin D (E933.5) ASSESSMENT: The BMD measured at Forearm Radius 33% is 0.505 g/cm2 with a T-score of -4.3. This patient is considered osteoporotic according to [facility]) criteria. Only the forearm was imaged due to patient stating she can not breathe when laying on table with head elevated. Site Region Measured Date Measured Age YA BMD Significant CHANGE T-score Left Forearm Radius 33% 11/20/2015 74.4 -4.3 0.505 g/cm2 World Health Organization [facility]) criteria for post-menopausal, Caucasian Women: Normal       T-score at or above -1 SD Osteopenia   T-score between -1 and -2.5 SD Osteoporosis T-score at or below -2.5 SD RECOMMENDATION: [facility] recommends that FDA-approved medical therapies be considered in postmenopausal women and men age 62 or older with a: 1. Hip or vertebral (clinical or morphometric) fracture. 2. T-score  of <-2.5 at the spine or hip. 3. Ten-year fracture probability by FRAX of 3% or greater for hip fracture or 20% or greater for major osteoporotic fracture. All treatment decisions require clinical judgment and consideration of individual patient factors, including patient preferences, co-morbidities, previous drug use, risk factors not captured in the FRAX model (e.g. falls, vitamin D deficiency, increased bone turnover, interval significant decline in bone density) and possible under - or over-estimation of fracture risk by FRAX. All patients should ensure an adequate intake of dietary calcium (1200 mg/d) and vitamin D (800 IU daily) unless contraindicated. FOLLOW-UP: People with diagnosed cases of osteoporosis or at high risk for fracture should have regular bone mineral density tests. For patients eligible for Medicare, routine testing is allowed once every 2 years. The testing frequency can be increased to one year for patients who have rapidly progressing disease, those who are receiving or discontinuing medical therapy to restore bone mass, or have additional risk factors. I have reviewed this report, and agree with the above findings. [facility] Radiology Electronically Signed   By: Lajean Manes M.D.   On: 11/20/2015 15:30   Dg Chest Portable 1 View  Result Date: 12/10/2015 CLINICAL DATA:  Nausea and shortness of breath for the last 2 days. Weakness. EXAM: PORTABLE CHEST 1 VIEW COMPARISON:  09/23/2015 FINDINGS: Severe emphysema with biapical scarring and postoperative the wedge resection clips at the right lung apex. The degree of apical volume loss/ scarring appears mildly increased from the prior exam although some of this may be positional. Extensive kyphosis. Atherosclerotic aortic arch and tortuosity. Newly indistinct hemidiaphragms partially from projection but also potentially from bibasilar airspace opacities. Heart size felt to be within normal limits. Scattered scarring in the lungs. Chronic  deformity from old fracture the right proximal humerus, healed. Bilateral airway  thickening noted. IMPRESSION: 1. Bibasilar airspace opacities potentially from atelectasis or pneumonia. 2. Extensive biapical pleuroparenchymal scarring with some postoperative findings at the right lung apex. 3. Atherosclerotic aortic arch. 4. Severe emphysema. 5. Increased bilateral airway thickening suggesting inflammation/bronchitis. Electronically Signed   By: Van Clines M.D.   On: 12/10/2015 15:09   Dg Abd 2 Views  Result Date: 12/12/2015 CLINICAL DATA:  Abdominal pain for a few days. History of hypertension, COPD, asthma, shortness of breath. EXAM: ABDOMEN - 2 VIEW COMPARISON:  Chest 09/23/2015 FINDINGS: Gas and stool throughout the colon. No small or large bowel distention. No free intra-abdominal air. No abnormal air-fluid levels. No radiopaque stones. Surgical clips in the right upper quadrant. Vascular calcifications. Degenerative changes in the spine. Right hip hemiarthroplasty. Small pleural effusions in the lung bases. IMPRESSION: Nonobstructive bowel gas pattern with diffusely stool-filled colon. Small bilateral pleural effusions. Electronically Signed   By: Lucienne Capers M.D.   On: 12/12/2015 22:54   Mm Digital Screening Bilateral  Result Date: 11/21/2015 CLINICAL DATA:  Screening. EXAM: DIGITAL SCREENING BILATERAL MAMMOGRAM WITH CAD COMPARISON:  None. ACR Breast Density Category b: There are scattered areas of fibroglandular density. FINDINGS: There are no findings suspicious for malignancy. Images were processed with CAD. IMPRESSION: No mammographic evidence of malignancy. A result letter of this screening mammogram will be mailed directly to the patient. RECOMMENDATION: Screening mammogram in one year. (Code:SM-B-01Y) BI-RADS CATEGORY  1: Negative. Electronically Signed   By: Everlean Alstrom M.D.   On: 11/21/2015 10:19     Assessment/Plan  Physical deconditioning Will have her work with  physical therapy and occupational therapy team to help with gait training and muscle strengthening exercises.fall precautions. Skin care. Encourage to be out of bed.   Copd exacerbation Breathing has improved. Continue her oxygen by nasal canula. continue and complete tapering course of prednisone. Continue mucinex for cough. Continue and complete azithromycin on 12/21/15. Continue her bronchodilator.  CHF Required iv diuresis in hospital. Continue lasix 20 mg daily and metorpolol 50 mg bid. Monitor weight.   afib Rate controlled. Continue metoprolol 50 mg bid and diltiazem 360 mg daily for rate control and pradaxa 150 mg bid for stroke prophylaxis. Continue digoxin and check digoxin level in 2 weeks  Klebsiella UTI Hydration encouraged. Continue cranberry supplement. Continue and complete ceftin 500 mg bid on 12/21/15.   Dysphagia SLP to evaluate with her difficulty swallowing pills. Aspiration precautions  Leukocytosis Afebrile. On prednisone and antibiotics. Monitor clinically  Thrombocytopenia No bleed reported. Monitor  gerd Stable with protonix  Iron def anemia Monitor h&h, continue feso4 325 mg daily  Osteoporosis Continue her weekly fosamax  HLD Continue atorvastatin 40 mg daily  OAB continue oxybutynin  Constipation Continue miralax and colace  Depression Continue her zoloft for now  History of fracture of right hip Stable pain. Continue norco 5-325 mg q4h prn pain and zanaflex at bedtime   Goals of care: short term rehabilitation   Labs/tests ordered: cbc, cmp 12/22/15, digoxin level in 2 weeks   Family/ staff Communication: reviewed care plan with patient and nursing supervisor    Blanchie Serve, MD Internal Medicine [place], [place] [address] Cell Phone (Monday-Friday 8 am - 5 pm): [phone] On Call: [phone] and follow prompts after 5 pm and on weekends Office Phone:  [phone] Office Fax: [phone]

## 2015-12-22 ENCOUNTER — Other Ambulatory Visit: Payer: Self-pay | Admitting: *Deleted

## 2015-12-22 NOTE — Patient Outreach (Signed)
[facility]) Care Management  12/22/2015  [name] [name] Jul 12, 1940 [id number]   [name] made a 2nd  attempt to try and contact patient to perform phone assessment, as well as assess and assist with social needs and services, without success.  No voicemail set up. [name] contacted SNF rep at [facility] where patient is supposedly residing for rehab and left  HIPAA complaint message for return call regarding this patient for assistance in making contact.  Will await callback and/or try patient again. Eduard Clos, MSW, [place] Worker  [place] [phone]

## 2015-12-23 ENCOUNTER — Other Ambulatory Visit: Payer: Self-pay | Admitting: *Deleted

## 2015-12-23 ENCOUNTER — Non-Acute Institutional Stay: Payer: Commercial Managed Care - HMO | Admitting: Family

## 2015-12-23 DIAGNOSIS — E440 Moderate protein-calorie malnutrition: Secondary | ICD-10-CM

## 2015-12-23 DIAGNOSIS — D72829 Elevated white blood cell count, unspecified: Secondary | ICD-10-CM | POA: Diagnosis not present

## 2015-12-23 DIAGNOSIS — B029 Zoster without complications: Secondary | ICD-10-CM | POA: Diagnosis not present

## 2015-12-23 MED ORDER — VALACYCLOVIR HCL 1 G PO TABS: 1000.0000 mg | ORAL_TABLET | Freq: Two times a day (BID) | ORAL | 0 refills | Status: DC

## 2015-12-23 NOTE — Patient Outreach (Signed)
[facility]) Care Management  12/23/2015  CHANCY IXTA [birth year] [id number]   [place] went to [facility] today to introduce self/role to patient as I was unable to reach her by phone.  Patient is familiar with [facility] program from previous involvement and was able to recall names of several [facility] staff.   [name] provided patient with welcome packet and consent forms- she wants to wait to sign when her son can review them.  [name] contacted son and he plans to visit mom and assist with papers- he is appreciative of our involvement and collaboration.   [name] will plan f/u visit to SNF next week to obtain consent forms and complete assessment/intake.   Eduard Clos, MSW, [place] Worker  [place] [phone]

## 2015-12-23 NOTE — Progress Notes (Signed)
Location:  [place] Room Number: [place] of Service:  SNF (31) Provider:  Jaquetta Currier FNP-C   Leonides Sake, MD  Patient Care Team: Leonides Sake, MD as PCP - General (Family Medicine) Alfonzo Feller, RN as [place], LCSW as Social Worker  Extended Emergency Contact Information Primary Emergency Contact: Scarbro,[name] Address: [address]          [place], [place] [address] [name] [employer] Phone: [phone] Relation: Son Secondary Emergency Contact: [name] [place] of [place] Mobile Phone: [phone] Relation: Relative  Code Status:  Full Code  Goals of care: Advanced Directive information Advanced Directives 12/10/2015  Does patient have an advance directive? No  Type of Advance Directive -  Does patient want to make changes to advanced directive? -  Copy of advanced directive(s) in chart? -  Would patient like information on creating an advanced directive? -  Pre-existing out of facility DNR order (yellow form or pink MOST form) -     Chief Complaint  Patient presents with  . Acute Visit    Rash     HPI:  Pt is a 75 y.o. female seen today at [facility] for an acute visit for evaluation of rash. She is seen in her room today. She complains of a rash on her buttock. She describes rash as painful with burning sensation.she denies any fever or chills. She was started on Valtrex 1 Gm X one dose by on Call MD previous night 12/22/2015.    Past Medical History:  Diagnosis Date  . Anemia   . Arthritis    "back" (11/21/2014)  . Asthma   . Blood dyscrasia    hx blood clotts  . Chronic bronchitis ([place])   . Chronic kidney disease   . Chronic lower back pain   . 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 GERD (gastroesophageal reflux disease)   . [id number])    "a few times/year" (11/21/2014)  .  History of blood transfusion X 2   "related to blood thinner I was taking"  . Hyperlipidemia   . Hypertension   . Migraine    "weekly probably" (11/21/2014)  . On home oxygen therapy    "3L; 24/7" (11/21/2014)  . Pneumonia    hx pneumonia   . Pneumothorax on right 09/2014  . Pulmonary embolism ([place])   . Shortness of breath   . Urinary incontinence    Past Surgical History:  Procedure Laterality Date  . ABDOMINAL HYSTERECTOMY    . CATARACT EXTRACTION W/ INTRAOCULAR LENS  IMPLANT, BILATERAL Bilateral   . CYSTOSCOPY W/ RETROGRADES Bilateral 06/11/2014   Procedure: CYSTOSCOPY WITH RETROGRADE PYELOGRAM;  Surgeon: [name], MD;  Location: [facility];  Service: Urology;  Laterality: Bilateral;  . FRACTURE SURGERY    . HARDWARE REMOVAL  01/29/2012   Procedure: HARDWARE REMOVAL;  Surgeon: [name], MD;  Location: [place];  Service: Orthopedics;  Laterality: Right;  . HEMIARTHROPLASTY SHOULDER FRACTURE Right   . HIP ARTHROPLASTY  01/29/2012   Procedure: ARTHROPLASTY BIPOLAR HIP;  Surgeon: [name], MD;  Location: [place];  Service: Orthopedics;  Laterality: Right;  . LAPAROSCOPIC CHOLECYSTECTOMY    . ORIF WRIST FRACTURE  10/13/2011   Procedure: OPEN REDUCTION INTERNAL FIXATION (ORIF) WRIST FRACTURE;  Surgeon: [name], MD;  Location: [place];  Service: Orthopedics;  Laterality: Right;  Open Reduction Internal Fixation Right Distal Radius   . TRANSURETHRAL RESECTION OF BLADDER TUMOR N/A 06/11/2014   Procedure: TRANSURETHRAL RESECTION OF BLADDER TUMOR (TURBT);  Surgeon: [name], MD;  Location: [facility];  Service: Urology;  Laterality: N/A;  . TUBAL LIGATION      Allergies  Allergen Reactions  . Eggs Or Egg-Derived Products Other (See Comments)    Dislikes eggs, every day. States no allergy      Medication List       Accurate as of 12/23/15  3:54 PM. Always use your most recent med list.          albuterol 108 (90 Base) MCG/ACT inhaler Commonly known as:  PROVENTIL  HFA;VENTOLIN HFA Inhale 2 puffs into the lungs 4 (four) times daily as needed for wheezing or shortness of breath.   albuterol (2.5 MG/3ML) 0.083% nebulizer solution Commonly known as:  PROVENTIL Take 2.5 mg by nebulization every 6 (six) hours as needed for wheezing or shortness of breath.   alendronate 70 MG tablet Commonly known as:  FOSAMAX Take 70 mg by mouth once a week. Take with a full glass of water on an empty stomach.   aspirin-sod bicarb-citric acid 325 MG Tbef tablet Commonly known as:  ALKA-SELTZER Take 325 mg by mouth every 6 (six) hours as needed (for stomach).   atorvastatin 40 MG tablet Commonly known as:  LIPITOR Take 40 mg by mouth every morning.   AZO CRANBERRY PO Take 2 capsules by mouth daily.   calcitonin (salmon) 200 UNIT/ACT nasal spray Commonly known as:  MIACALCIN/FORTICAL Place 1 spray into alternate nostrils daily.   cyanocobalamin 500 MCG tablet Take 500 mcg by mouth daily.   dabigatran 150 MG Caps capsule Commonly known as:  PRADAXA Take 150 mg by mouth 2 (two) times daily.   digoxin 0.125 MG tablet Commonly known as:  LANOXIN Take 1 tablet (0.125 mg total) by mouth daily.   diltiazem 360 MG 24 hr capsule Commonly known as:  CARDIZEM CD Take 1 capsule (360 mg total) by mouth every morning.   docusate sodium 100 MG capsule Commonly known as:  COLACE Take 1 capsule (100 mg total) by mouth every 12 (twelve) hours.   ferrous sulfate 325 (65 FE) MG tablet Take 325 mg by mouth daily with breakfast.   Fish Oil 1000 MG Caps Take 1,000 mg by mouth daily.   fluticasone 50 MCG/ACT nasal spray Commonly known as:  FLONASE Place 1 spray into both nostrils daily as needed for allergies or rhinitis.   furosemide 20 MG tablet Commonly known as:  LASIX Take 1 tablet (20 mg total) by mouth daily.   guaiFENesin 600 MG 12 hr tablet Commonly known as:  MUCINEX Take 2 tablets (1,200 mg total) by mouth 2 (two) times daily.     HYDROcodone-acetaminophen 5-325 MG tablet Commonly known as:  NORCO/VICODIN Take 1 tablet by mouth every 4 (four) hours as needed.   metoprolol 50 MG tablet Commonly known as:  LOPRESSOR Take 1 tablet (50 mg total) by mouth 2 (two) times daily.   oxybutynin 5 MG 24 hr tablet Commonly known as:  DITROPAN-XL Take 10 mg by mouth at bedtime.   pantoprazole 40 MG tablet Commonly known as:  PROTONIX Take 1 tablet (40 mg total) by mouth daily.   polyethylene glycol packet Commonly known as:  MIRALAX / GLYCOLAX Take 17 g by mouth daily.   predniSONE 10 MG tablet Commonly known as:  DELTASONE Take 6-5-4-3-2-1 PO daily till gone  sertraline 25 MG tablet Commonly known as:  ZOLOFT Take 1 tablet (25 mg total) by mouth at bedtime.   tiotropium 18 MCG inhalation capsule Commonly known as:  SPIRIVA Place 1 capsule (18 mcg total) into inhaler and inhale daily.   tiZANidine 2 MG tablet Commonly known as:  ZANAFLEX Take 2 mg by mouth at bedtime.   valACYclovir 1000 MG tablet Commonly known as:  VALTREX Take 1 tablet (1,000 mg total) by mouth 2 (two) times daily.       Review of Systems  Constitutional: Negative for appetite change, chills and fever.  HENT: Negative for congestion, rhinorrhea, sinus pressure, sneezing and sore throat.   Eyes: Negative.   Respiratory: Positive for cough. Negative for chest tightness, shortness of breath and wheezing.        Oxygen via [place]   Cardiovascular: Negative for chest pain, palpitations and leg swelling.  Gastrointestinal: Negative for abdominal distention, abdominal pain, constipation and diarrhea.  Genitourinary: Negative for dysuria, frequency and urgency.  Musculoskeletal: Positive for gait problem.  Skin: Positive for rash. Negative for pallor.  Psychiatric/Behavioral: Negative for agitation, confusion, hallucinations and sleep disturbance. The patient is not nervous/anxious.     Immunization History  Administered Date(s)  Administered  . Influenza-Unspecified 11/13/2013  . PPD Test 12/16/2015   Pertinent  Health Maintenance Due  Topic Date Due  . COLONOSCOPY  06/09/1991  . PNA vac Low Risk Adult (1 of 2 - PCV13) 06/09/2006  . INFLUENZA VACCINE  10/14/2015  . MAMMOGRAM  11/19/2017  . DEXA SCAN  Completed   Fall Risk  05/22/2015 02/20/2015 11/19/2014 10/07/2014 10/07/2014  Falls in the past year? No No No No No  Risk for fall due to : History of fall(s) Impaired balance/gait;History of fall(s) Impaired balance/gait;History of fall(s);Impaired mobility History of fall(s);Impaired balance/gait History of fall(s)  Risk for fall due to (comments): - - - - patient states its been 3 years, using rollator   Functional Status Survey:    Vitals:   12/23/15 1015  BP: 121/63  Pulse: 68  Resp: 18  Temp: 97.4 F (36.3 C)  SpO2: 94%  Weight: 127 lb (57.6 kg)  Height: 5\' 1"  (1.549 m)   Body mass index is 24 kg/m. Physical Exam  Constitutional: She is oriented to person, place, and time. She appears well-developed and well-nourished. No distress.  HENT:  Head: Normocephalic.  Mouth/Throat: Oropharynx is clear and moist. No oropharyngeal exudate.  Eyes: Conjunctivae and EOM are normal. Pupils are equal, round, and reactive to light. Right eye exhibits no discharge. Left eye exhibits no discharge. No scleral icterus.  Neck: Normal range of motion. No JVD present. No thyromegaly present.  Cardiovascular: Normal rate, regular rhythm, normal heart sounds and intact distal pulses.  Exam reveals no gallop and no friction rub.   No murmur heard. Pulmonary/Chest: Effort normal and breath sounds normal. No respiratory distress. She has no wheezes. She has no rales.  Oxygen 2 Liters via [place]   Abdominal: Soft. Bowel sounds are normal. She exhibits no distension. There is no tenderness. There is no rebound and no guarding.  Musculoskeletal: She exhibits no edema or tenderness.  Unsteady gait.   Lymphadenopathy:    She has no  cervical adenopathy.  Neurological: She is oriented to person, place, and time.  Skin: Skin is warm and dry. No erythema. No pallor.  Left gluteal/sacral area multiple dry rash difficult to discern previous vesicular verse bulla.   Psychiatric: She has a normal mood and affect.  Labs reviewed:  Recent Labs  12/14/15 0324 12/15/15 0423 12/16/15 0344  NA 135 136 136  K 5.1 4.3 4.1  CL 93* 91* 89*  CO2 36* 38* 38*  GLUCOSE 168* 167* 152*  BUN 26* 26* 28*  CREATININE 0.78 0.68 0.75  CALCIUM 8.5* 8.6* 8.4*    Recent Labs  08/30/15 0825 12/10/15 1526  AST 21 19  ALT 29 19  ALKPHOS 79 67  BILITOT 0.4 0.8  PROT 6.7 6.4*  ALBUMIN 3.5 3.4*    Recent Labs  08/23/15 1051 08/30/15 0825  12/10/15 1526  12/12/15 0417 12/13/15 0359 12/14/15 0324  WBC 6.2 18.8*  < > 9.6  < > 20.9* 14.5* 12.0*  NEUTROABS 4.3 14.8*  --  7.9*  --   --   --   --   HGB 11.4* 13.4  < > 11.8*  < > 11.9* 12.0 12.2  HCT 37.8 43.7  < > 40.8  < > 40.7 40.9 41.9  MCV 90.9 88.5  < > 90.9  < > 89.3 88.7 89.1  PLT 113* 251  < > 142*  < > 156 130* 144*  < > = values in this interval not displayed. Lab Results  Component Value Date   TSH 1.444 12/12/2015   No results found for: HGBA1C Lab Results  Component Value Date   CHOL 102 08/05/2013   HDL 47 08/05/2013   LDLCALC 42 08/05/2013   TRIG 66 08/05/2013   CHOLHDL 2.2 08/05/2013    Assessment/Plan 1. Herpes zoster without complication Left gluteal/sacral area multiple dry rash difficult to discern previous vesicular verse bulla. Started on Valtrex 1 Gm x 1 dose overnight by on call MD. Will start Valtrex 1 Gm every 8 Hrs X 7 days. Currently no drainage from rash will continue on universal standard precautions.   2. Moderate protein-calorie malnutrition (HCC) TP 4.9, ALB 2.82 ( 12/22/2015). RD  3. Leukocytosis  Afebrile. WBC 15.9  (12/22/2015) status post hospital admission from 12/10/15-12/16/15 with acute on chronic respiratory failure. She had  copd exacerbation, acute chf exacerbation and afib with RVR.She has completed course of antibiotics for UTI and COPD. Currently on Tapered Steroids. Continue to monitor Temp Curve and WBC. CBC/diff 12/25/2015   Family/ staff Communication: Reviewed plan of care with patient and facility Nurse supervisor.   Labs/tests ordered:  CBC/diff 12/25/2015

## 2015-12-31 ENCOUNTER — Other Ambulatory Visit: Payer: Self-pay | Admitting: *Deleted

## 2015-12-31 NOTE — Patient Outreach (Signed)
[facility]) Care Management  [facility] Social Work  12/31/2015  [name] [name] 1940/10/17 [id number]  Subjective:  Patient admitted to [facility] after hospital stay.   Encounter Medications:  Outpatient Encounter Prescriptions as of 12/31/2015  Medication Sig  . albuterol (PROVENTIL HFA;VENTOLIN HFA) 108 (90 Base) MCG/ACT inhaler Inhale 2 puffs into the lungs 4 (four) times daily as needed for wheezing or shortness of breath.  Marland Kitchen albuterol (PROVENTIL) (2.5 MG/3ML) 0.083% nebulizer solution Take 2.5 mg by nebulization every 6 (six) hours as needed for wheezing or shortness of breath.   Marland Kitchen alendronate (FOSAMAX) 70 MG tablet Take 70 mg by mouth once a week. Take with a full glass of water on an empty stomach.  Marland Kitchen aspirin-sod bicarb-citric acid (ALKA-SELTZER) 325 MG TBEF tablet Take 325 mg by mouth every 6 (six) hours as needed (for stomach).  Marland Kitchen atorvastatin (LIPITOR) 40 MG tablet Take 40 mg by mouth every morning.   . calcitonin, salmon, (MIACALCIN/FORTICAL) 200 UNIT/ACT nasal spray Place 1 spray into alternate nostrils daily.  . Cranberry-Vitamin C-Probiotic (AZO CRANBERRY PO) Take 2 capsules by mouth daily.  . cyanocobalamin 500 MCG tablet Take 500 mcg by mouth daily.  . dabigatran (PRADAXA) 150 MG CAPS capsule Take 150 mg by mouth 2 (two) times daily.  . digoxin (LANOXIN) 0.125 MG tablet Take 1 tablet (0.125 mg total) by mouth daily.  Marland Kitchen diltiazem (CARDIZEM CD) 360 MG 24 hr capsule Take 1 capsule (360 mg total) by mouth every morning.  . docusate sodium (COLACE) 100 MG capsule Take 1 capsule (100 mg total) by mouth every 12 (twelve) hours.  . ferrous sulfate 325 (65 FE) MG tablet Take 325 mg by mouth daily with breakfast.  . fluticasone (FLONASE) 50 MCG/ACT nasal spray Place 1 spray into both nostrils daily as needed for allergies or rhinitis.  . furosemide (LASIX) 20 MG tablet Take 1 tablet (20 mg total) by mouth daily.  Marland Kitchen guaiFENesin (MUCINEX) 600 MG 12 hr tablet  Take 2 tablets (1,200 mg total) by mouth 2 (two) times daily.  Marland Kitchen HYDROcodone-acetaminophen (NORCO/VICODIN) 5-325 MG tablet Take 1 tablet by mouth every 4 (four) hours as needed.  . metoprolol (LOPRESSOR) 50 MG tablet Take 1 tablet (50 mg total) by mouth 2 (two) times daily.  . Omega-3 Fatty Acids (FISH OIL) 1000 MG CAPS Take 1,000 mg by mouth daily.   Marland Kitchen oxybutynin (DITROPAN-XL) 5 MG 24 hr tablet Take 10 mg by mouth at bedtime.  . pantoprazole (PROTONIX) 40 MG tablet Take 1 tablet (40 mg total) by mouth daily.  . polyethylene glycol (MIRALAX / GLYCOLAX) packet Take 17 g by mouth daily.  . predniSONE (DELTASONE) 10 MG tablet Take 6-5-4-3-2-1 PO daily till gone  . sertraline (ZOLOFT) 25 MG tablet Take 1 tablet (25 mg total) by mouth at bedtime.  Marland Kitchen tiotropium (SPIRIVA) 18 MCG inhalation capsule Place 1 capsule (18 mcg total) into inhaler and inhale daily.  Marland Kitchen tiZANidine (ZANAFLEX) 2 MG tablet Take 2 mg by mouth at bedtime.  . valACYclovir (VALTREX) 1000 MG tablet Take 1 tablet (1,000 mg total) by mouth 2 (two) times daily.   No facility-administered encounter medications on file as of 12/31/2015.     Functional Status:  In your present state of health, do you have any difficulty performing the following activities: 12/31/2015 12/16/2015  Hearing? N N  Vision? Y N  Difficulty concentrating or making decisions? N Y  Walking or climbing stairs? Y Y  Dressing or bathing? [name]  Doing errands, shopping? [name]  Preparing Food and eating ? N -  Using the Toilet? N -  In the past six months, have you accidently leaked urine? Y -  Do you have problems with loss of bowel control? Y -  Managing your Medications? N -  Managing your Finances? Y -  Housekeeping or managing your Housekeeping? Y -  Some recent data might be hidden    Fall/Depression Screening:  PHQ 2/9 Scores 12/31/2015 05/22/2015 03/31/2015 02/20/2015 02/20/2015 11/19/2014 10/25/2014  PHQ - 2 Score 0 0 0 0 0 0 0    Assessment: [name] met with  patient on 12/31/15- consent signed at that time.  [name] discussed her progress with SNF rehab and she is hoping to go home next week.  [name] also spoke with her son by phone and with SNF dc planner to request wheelchair per son's request.  Patient anticipated goinghome next week to her home where she lives alone. She reports having personal care aide ([facility] [employer] agency) 2 hours 5 days week as well as [place] from [employer]. Prior to admission, she also was receiving meals on wheels 5 days week.  Patient and son do not anticipate any other needs at this time.  Plan:  [name] discussed plans for [name] to follow up next week to further assess/determine dc needs and dc date.   [facility] CM Care Plan Problem One   Flowsheet Row Most Recent Value  Care Plan Problem One  Patient admitted to SNF rehab post hospitalization. [Patient admitted to SNF rehab post hospitalization.]  Role Documenting the Problem One  Clinical Social Worker  Care Plan for Problem One  Active  [name] Long Term Goal (31-90 days)  Patient will participate in SNF rehabs daily as provided and return home in the next 90 days.   [name] Long Term Goal Start Date  12/31/15  Interventions for Problem One Long Term Goal  [name] discussed the importance of patient participation with skilled PT and OT to enhance her moiblity and independence for return to home.   [name] CM Short Term Goal #1 (0-30 days)  Patient will work with PT and OT daily at SNF in the next 30 days.  [name] CM Short Term Goal #1 Start Date  12/31/15       Eduard Clos, MSW, [place] Worker  [place] [phone]

## 2016-01-02 ENCOUNTER — Other Ambulatory Visit: Payer: Self-pay

## 2016-01-02 MED ORDER — HYDROCODONE-ACETAMINOPHEN 5-325 MG PO TABS: 1.0000 | ORAL_TABLET | ORAL | 0 refills | Status: DC | PRN

## 2016-01-02 NOTE — Telephone Encounter (Signed)
Rx faxed to [facility] @ [phone], phone number [phone]  

## 2016-01-05 ENCOUNTER — Other Ambulatory Visit: Payer: Self-pay | Admitting: *Deleted

## 2016-01-05 NOTE — Patient Outreach (Signed)
[facility]) Care Management  01/05/2016  TANESA BROUS [birth year] [id number]   [name] left message for patient as well as at SNF for update on planned dc to home. Will await callback or try again 01/07/16. RNCM advised of pending dc to home.      Eduard Clos, MSW, [place] Worker  [place] [phone]

## 2016-01-07 ENCOUNTER — Other Ambulatory Visit: Payer: Self-pay | Admitting: *Deleted

## 2016-01-07 NOTE — Patient Outreach (Signed)
[facility]) Care Management  01/07/2016  MAHOGANY BUDLONG [birth year] [id number]   [name] spoke with son by phone who reports his mother is still at [facility] and may comehome next week.  [name] will plan follow up call or visit to SNF and update [name].   Eduard Clos, MSW, [place] Worker  [place] [phone]

## 2016-01-12 ENCOUNTER — Non-Acute Institutional Stay (SKILLED_NURSING_FACILITY): Payer: Commercial Managed Care - HMO | Admitting: Family

## 2016-01-12 DIAGNOSIS — J449 Chronic obstructive pulmonary disease, unspecified: Secondary | ICD-10-CM | POA: Diagnosis not present

## 2016-01-12 DIAGNOSIS — I10 Essential (primary) hypertension: Secondary | ICD-10-CM

## 2016-01-12 DIAGNOSIS — I480 Paroxysmal atrial fibrillation: Secondary | ICD-10-CM | POA: Diagnosis not present

## 2016-01-12 DIAGNOSIS — F329 Major depressive disorder, single episode, unspecified: Secondary | ICD-10-CM | POA: Diagnosis not present

## 2016-01-12 DIAGNOSIS — E782 Mixed hyperlipidemia: Secondary | ICD-10-CM

## 2016-01-12 DIAGNOSIS — K219 Gastro-esophageal reflux disease without esophagitis: Secondary | ICD-10-CM | POA: Diagnosis not present

## 2016-01-12 DIAGNOSIS — I2510 Atherosclerotic heart disease of native coronary artery without angina pectoris: Secondary | ICD-10-CM | POA: Diagnosis not present

## 2016-01-12 DIAGNOSIS — F32A Depression, unspecified: Secondary | ICD-10-CM

## 2016-01-12 DIAGNOSIS — M159 Polyosteoarthritis, unspecified: Secondary | ICD-10-CM

## 2016-01-12 NOTE — Progress Notes (Signed)
Location:  [place] Room Number: [place] of Service:  SNF (31)  Provider: Marlowe Sax FNP-C   PCP: Leonides Sake, MD Patient Care Team: Leonides Sake, MD as PCP - General (Family Medicine) Alfonzo Feller, RN as [place], LCSW as Social Worker Deirdre Peer, LCSW as Social Worker  Extended Emergency Contact Information Primary Emergency Contact: [name] Address: [address]          [place], [place] [address] [name] [employer] Phone: [phone] Relation: Son Secondary Emergency Contact: [name] [place] Phone: [phone] Relation: Relative  Code Status: Full Code  Goals of care:  Advanced Directive information Advanced Directives 12/31/2015  Does patient have an advance directive? Yes  Type of Advance Directive [place]  Does patient want to make changes to advanced directive? -  Copy of advanced directive(s) in chart? No - copy requested  Would patient like information on creating an advanced directive? -  Pre-existing out of facility DNR order (yellow form or pink MOST form) -     Allergies  Allergen Reactions  . Eggs Or Egg-Derived Products Other (See Comments)    Dislikes eggs, every day. States no allergy    Chief Complaint  Patient presents with  . Discharge Note    Discharge home     HPI:  75 y.o. female seen today at [facility] for discharge home.She was here for short term rehabilitation post hospital admission from 12/10/2015-12/16/2015 with acute on chronic respiratory failure. She had COPD exacerbation, acute CHF exacerbation and Afib with RVR.She required I.V antibiotics, IV steroid,diuresis and cardizem drip.She was also treated for klebsiella UTI.She has a medical history of COPD, CHF, HTN, Afib, Depression,GERD among other conditions.She is seen in her room this  visit. She denies any acute issues this visit.she was treated 12/22/2016 for Herpes zoster with valtrex 1 GM for X 7 days. Rash resolved.she has worked with PT/OT but continues to be limited due to COPD requires uses of wheelchair.She will be discharged home with [facility] PT/OT to continue with ROM, exercise, gait stability and muscle strengthening.She will require a standard wheelchair with Cushion, anti tippers, extended brake handles, removable elevating leg rests  to enable her to maintain current level of independence which cannot be achieved with walker or cane.She states has own FWW for use on short distances. Also has own 3-1 at home.Home health service will be arranged by facility social worker. Prescription medication with be written x 1 month then patient to follow up with PCP.    Past Medical History:  Diagnosis Date  . Anemia   . Arthritis    "back" (11/21/2014)  . Asthma   . Blood dyscrasia    hx blood [place]otts  . Chronic bronchitis ([place])   . Chronic kidney disease   . Chronic lower back pain   . 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 GERD (gastroesophageal reflux disease)   . [id number])    "a few times/year" (11/21/2014)  . History of blood transfusion X 2   "related to blood thinner I was taking"  . Hyperlipidemia   . Hypertension   . Migraine    "weekly probably" (11/21/2014)  . On home oxygen therapy    "3L; 24/7" (11/21/2014)  . Pneumonia    hx pneumonia   . Pneumothorax on right 09/2014  .  Pulmonary embolism ([place])   . Shortness of breath   . Urinary incontinence     Past Surgical History:  Procedure Laterality Date  . ABDOMINAL HYSTERECTOMY    . CATARACT EXTRACTION W/ INTRAOCULAR LENS  IMPLANT, BILATERAL Bilateral   . CYSTOSCOPY W/ RETROGRADES Bilateral 06/11/2014   Procedure: CYSTOSCOPY WITH RETROGRADE PYELOGRAM;  Surgeon: [name], MD;  Location: [facility];  Service: Urology;  Laterality: Bilateral;  . FRACTURE SURGERY    . HARDWARE  REMOVAL  01/29/2012   Procedure: HARDWARE REMOVAL;  Surgeon: [name], MD;  Location: [place];  Service: Orthopedics;  Laterality: Right;  . HEMIARTHROPLASTY SHOULDER FRACTURE Right   . HIP ARTHROPLASTY  01/29/2012   Procedure: ARTHROPLASTY BIPOLAR HIP;  Surgeon: [name], MD;  Location: [place];  Service: Orthopedics;  Laterality: Right;  . LAPAROSCOPIC CHOLECYSTECTOMY    . ORIF WRIST FRACTURE  10/13/2011   Procedure: OPEN REDUCTION INTERNAL FIXATION (ORIF) WRIST FRACTURE;  Surgeon: [name], MD;  Location: [place];  Service: Orthopedics;  Laterality: Right;  Open Reduction Internal Fixation Right Distal Radius   . TRANSURETHRAL RESECTION OF BLADDER TUMOR N/A 06/11/2014   Procedure: TRANSURETHRAL RESECTION OF BLADDER TUMOR (TURBT);  Surgeon: [name], MD;  [facility];  Service: Urology;  Laterality: N/A;  . TUBAL LIGATION        reports that she quit smoking about 22 months ago. Her smoking use included Cigarettes. She has a 56.00 pack-year smoking history. She has never used smokeless tobacco. She reports that she does not drink alcohol or use drugs. Social History   Social History  . Marital status: Widowed    Spouse name: N/A  . Number of children: N/A  . Years of education: N/A   Occupational History  . Not on file.   Social History Main Topics  . Smoking status: Former Smoker    Packs/day: 1.00    Years: 56.00    Types: Cigarettes    Quit date: 02/12/2014  . Smokeless tobacco: Never Used  . Alcohol use No  . Drug use: No  . Sexual activity: Not on file   Other Topics Concern  . Not on file   Social History Narrative  . No narrative on file    Allergies  Allergen Reactions  . Eggs Or Egg-Derived Products Other (See Comments)    Dislikes eggs, every day. States no allergy    Pertinent  Health Maintenance Due  Topic Date Due  . COLONOSCOPY  06/09/1991  . PNA vac Low Risk Adult (1 of 2 - PCV13) 06/09/2006  . INFLUENZA VACCINE  10/14/2015  .  MAMMOGRAM  11/19/2017  . DEXA SCAN  Completed    Medications:   Medication List       Accurate as of 01/12/16  7:32 PM. Always use your most recent med list.          albuterol 108 (90 Base) MCG/ACT inhaler Commonly known as:  PROVENTIL HFA;VENTOLIN HFA Inhale 2 puffs into the lungs 4 (four) times daily as needed for wheezing or shortness of breath.   albuterol (2.5 MG/3ML) 0.083% nebulizer solution Commonly known as:  PROVENTIL Take 2.5 mg by nebulization every 6 (six) hours as needed for wheezing or shortness of breath.   alendronate 70 MG tablet Commonly known as:  FOSAMAX Take 70 mg by mouth once a week. Take with a full glass of water on an empty stomach.   aspirin-sod bicarb-citric acid 325 MG Tbef tablet Commonly known as:  ALKA-SELTZER Take 325 mg by mouth every 6 (six) hours as needed (for stomach).   atorvastatin 40 MG tablet Commonly known as:  LIPITOR Take 40 mg by mouth every morning.   AZO CRANBERRY PO Take 2 capsules by mouth daily.   calcitonin (salmon) 200 UNIT/ACT nasal spray Commonly known as:  MIACALCIN/FORTICAL Place 1 spray into alternate nostrils daily.   cyanocobalamin 500 MCG tablet Take 500 mcg by mouth daily.   dabigatran 150 MG Caps capsule Commonly known as:  PRADAXA Take 150 mg by mouth 2 (two) times daily.   digoxin 0.125 MG tablet Commonly known as:  LANOXIN Take 1 tablet (0.125 mg total) by mouth daily.   diltiazem 360 MG 24 hr capsule Commonly known as:  CARDIZEM CD Take 1 capsule (360 mg total) by mouth every morning.   docusate sodium 100 MG capsule Commonly known as:  COLACE Take 1 capsule (100 mg total) by mouth every 12 (twelve) hours.   ferrous sulfate 325 (65 FE) MG tablet Take 325 mg by mouth daily with breakfast.   Fish Oil 1000 MG Caps Take 1,000 mg by mouth daily.   fluticasone 50 MCG/ACT nasal spray Commonly known as:  FLONASE Place 1 spray into both nostrils daily as needed for allergies or rhinitis.     furosemide 20 MG tablet Commonly known as:  LASIX Take 1 tablet (20 mg total) by mouth daily.   guaiFENesin 600 MG 12 hr tablet Commonly known as:  MUCINEX Take 2 tablets (1,200 mg total) by mouth 2 (two) times daily.   HYDROcodone-acetaminophen 5-325 MG tablet Commonly known as:  NORCO/VICODIN Take 1 tablet by mouth every 4 (four) hours as needed.   metoprolol 50 MG tablet Commonly known as:  LOPRESSOR Take 1 tablet (50 mg total) by mouth 2 (two) times daily.   oxybutynin 5 MG 24 hr tablet Commonly known as:  DITROPAN-XL Take 10 mg by mouth at bedtime.   pantoprazole 40 MG tablet Commonly known as:  PROTONIX Take 1 tablet (40 mg total) by mouth daily.   polyethylene glycol packet Commonly known as:  MIRALAX / GLYCOLAX Take 17 g by mouth daily.   sertraline 25 MG tablet Commonly known as:  ZOLOFT Take 1 tablet (25 mg total) by mouth at bedtime.   tiotropium 18 MCG inhalation capsule Commonly known as:  SPIRIVA Place 1 capsule (18 mcg total) into inhaler and inhale daily.   tiZANidine 2 MG tablet Commonly known as:  ZANAFLEX Take 2 mg by mouth at bedtime.       Review of Systems  Constitutional: Negative for appetite change, chills and fever.  HENT: Negative for congestion, rhinorrhea, sinus pressure, sneezing and sore throat.   Eyes: Negative.   Respiratory: Negative for cough, chest tightness, shortness of breath and wheezing.        Oxygen via [place]   Cardiovascular: Negative for chest pain, palpitations and leg swelling.  Gastrointestinal: Negative for abdominal distention, abdominal pain, constipation and diarrhea.  Endocrine: Negative.   Genitourinary: Negative for dysuria, frequency and urgency.  Musculoskeletal: Positive for gait problem.  Skin: Negative for color change, pallor and rash.  Neurological: Negative for dizziness, seizures, syncope, light-headedness and headaches.  Hematological: Does not bruise/bleed easily.  Psychiatric/Behavioral:  Negative for agitation, confusion, hallucinations and sleep disturbance. The patient is not nervous/anxious.     Vitals:   01/12/16 1230  BP: 138/60  Pulse: 80  Resp: 20  Temp: 97.3 F (36.3 C)  SpO2: 93%  Weight: 127 lb (  57.6 kg)  Height: 5\' 1"  (1.549 m)   Body mass index is 24 kg/m. Physical Exam  Constitutional: She is oriented to person, place, and time. She appears well-developed and well-nourished. No distress.  Pleasant elderly in no acute distress  HENT:  Head: Normocephalic.  Mouth/Throat: Oropharynx is clear and moist. No oropharyngeal exudate.  Eyes: Conjunctivae and EOM are normal. Pupils are equal, round, and reactive to light. Right eye exhibits no discharge. Left eye exhibits no discharge. No scleral icterus.  Neck: Normal range of motion. No JVD present. No thyromegaly present.  Cardiovascular: Normal rate, regular rhythm, normal heart sounds and intact distal pulses.  Exam reveals no gallop and no friction rub.   No murmur heard. Pulmonary/Chest: Effort normal and breath sounds normal. No respiratory distress. She has no wheezes. She has no rales.  continuous oxygen 2 Liters via [place]   Abdominal: Soft. Bowel sounds are normal. She exhibits no distension. There is no tenderness. There is no rebound and no guarding.  Musculoskeletal: She exhibits no edema, tenderness or deformity.  Unsteady gait.   Lymphadenopathy:    She has no cervical adenopathy.  Neurological: She is oriented to person, place, and time.  Skin: Skin is warm and dry. No erythema. No pallor.  Psychiatric: She has a normal mood and affect.    Labs reviewed: Basic Metabolic Panel:  Recent Labs  12/14/15 0324 12/15/15 0423 12/16/15 0344  NA 135 136 136  K 5.1 4.3 4.1  CL 93* 91* 89*  CO2 36* 38* 38*  GLUCOSE 168* 167* 152*  BUN 26* 26* 28*  CREATININE 0.78 0.68 0.75  CALCIUM 8.5* 8.6* 8.4*   Liver Function Tests:  Recent Labs  08/30/15 0825 12/10/15 1526  AST 21 19  ALT 29 19   ALKPHOS 79 67  BILITOT 0.4 0.8  PROT 6.7 6.4*  ALBUMIN 3.5 3.4*    Recent Labs  08/30/15 0825  LIPASE 23   CBC:  Recent Labs  08/23/15 1051 08/30/15 0825  12/10/15 1526  12/12/15 0417 12/13/15 0359 12/14/15 0324  WBC 6.2 18.8*  < > 9.6  < > 20.9* 14.5* 12.0*  NEUTROABS 4.3 14.8*  --  7.9*  --   --   --   --   HGB 11.4* 13.4  < > 11.8*  < > 11.9* 12.0 12.2  HCT 37.8 43.7  < > 40.8  < > 40.7 40.9 41.9  MCV 90.9 88.5  < > 90.9  < > 89.3 88.7 89.1  PLT 113* 251  < > 142*  < > 156 130* 144*  < > = values in this interval not displayed. Cardiac Enzymes:  Recent Labs  09/23/15 1300 12/10/15 1526  TROPONINI <0.03 <0.03   Assessment/Plan:   1. Essential hypertension B/p stable.continue on metoprolol 50 mg twice daily. BMP in 1-2 weeks with PCP   2. Paroxysmal atrial fibrillation  Continue on digoxin, metoprolol and pradaxa.   3. Coronary artery disease Chest pain free.continue on metoprolol and atorvastatin.   4. Chronic obstructive pulmonary disease Recent hospital admission for exacerbation. Has improved.continue on Duoneb and  Albuterol. On continuous oxygen 2 Liters via nasal cannula.   5. Gastroesophageal reflux disease without esophagitis Stable.Continue on Protonix.   6. Mixed hyperlipidemia Continue on atorvastatin. Lipid panel with PCP   7. Depression Stable. Continue on Zoloft 25 mg Tablet at bedtime.moniotor for mood changes.    8. Osteoarthritis of multiple joints Continue on tylenol as needed.   Patient is being discharged  with the following home health services:    -PT/OT for ROM, Exercise, Gait stability and muscle strengthening   Patient is being discharged with the following durable medical equipment:   - Standard Wheelchair with Cushion, removable elevating leg rests and extended brake handles.    Patient has been advised to f/u with their PCP in 1-2 weeks to for a transitions of care visit.  Social services at their facility was  responsible for arranging this appointment.  Pt was provided with adequate prescriptions of noncontrolled medications to reach the scheduled appointment.For controlled substances, a limited supply was provided as appropriate for the individual patient.  If the pt normally receives these medications from a pain clinic or has a contract with another physician, these medications should be received from that clinic or physician only).    Future labs/tests needed:  CBC, BMP in 1-2 weeks with PCP

## 2016-01-14 ENCOUNTER — Other Ambulatory Visit: Payer: Self-pay | Admitting: *Deleted

## 2016-01-14 NOTE — Patient Outreach (Signed)
[facility]) Care Management  [facility] Social Work  01/14/2016  [name] [name] June 02, 1940 [id number]  Subjective:  Patient remains in [name] planned for today to home with [facility].   Encounter Medications:  Outpatient Encounter Prescriptions as of 01/14/2016  Medication Sig  . albuterol (PROVENTIL HFA;VENTOLIN HFA) 108 (90 Base) MCG/ACT inhaler Inhale 2 puffs into the lungs 4 (four) times daily as needed for wheezing or shortness of breath.  Marland Kitchen albuterol (PROVENTIL) (2.5 MG/3ML) 0.083% nebulizer solution Take 2.5 mg by nebulization every 6 (six) hours as needed for wheezing or shortness of breath.   Marland Kitchen alendronate (FOSAMAX) 70 MG tablet Take 70 mg by mouth once a week. Take with a full glass of water on an empty stomach.  Marland Kitchen aspirin-sod bicarb-citric acid (ALKA-SELTZER) 325 MG TBEF tablet Take 325 mg by mouth every 6 (six) hours as needed (for stomach).  Marland Kitchen atorvastatin (LIPITOR) 40 MG tablet Take 40 mg by mouth every morning.   . calcitonin, salmon, (MIACALCIN/FORTICAL) 200 UNIT/ACT nasal spray Place 1 spray into alternate nostrils daily.  . Cranberry-Vitamin C-Probiotic (AZO CRANBERRY PO) Take 2 capsules by mouth daily.  . cyanocobalamin 500 MCG tablet Take 500 mcg by mouth daily.  . dabigatran (PRADAXA) 150 MG CAPS capsule Take 150 mg by mouth 2 (two) times daily.  . digoxin (LANOXIN) 0.125 MG tablet Take 1 tablet (0.125 mg total) by mouth daily.  Marland Kitchen diltiazem (CARDIZEM CD) 360 MG 24 hr capsule Take 1 capsule (360 mg total) by mouth every morning.  . docusate sodium (COLACE) 100 MG capsule Take 1 capsule (100 mg total) by mouth every 12 (twelve) hours.  . ferrous sulfate 325 (65 FE) MG tablet Take 325 mg by mouth daily with breakfast.  . fluticasone (FLONASE) 50 MCG/ACT nasal spray Place 1 spray into both nostrils daily as needed for allergies or rhinitis.  . furosemide (LASIX) 20 MG tablet Take 1 tablet (20 mg total) by mouth daily.  Marland Kitchen guaiFENesin (MUCINEX) 600 MG  12 hr tablet Take 2 tablets (1,200 mg total) by mouth 2 (two) times daily.  Marland Kitchen HYDROcodone-acetaminophen (NORCO/VICODIN) 5-325 MG tablet Take 1 tablet by mouth every 4 (four) hours as needed.  . metoprolol (LOPRESSOR) 50 MG tablet Take 1 tablet (50 mg total) by mouth 2 (two) times daily.  . Omega-3 Fatty Acids (FISH OIL) 1000 MG CAPS Take 1,000 mg by mouth daily.   Marland Kitchen oxybutynin (DITROPAN-XL) 5 MG 24 hr tablet Take 10 mg by mouth at bedtime.  . pantoprazole (PROTONIX) 40 MG tablet Take 1 tablet (40 mg total) by mouth daily.  . polyethylene glycol (MIRALAX / GLYCOLAX) packet Take 17 g by mouth daily.  . sertraline (ZOLOFT) 25 MG tablet Take 1 tablet (25 mg total) by mouth at bedtime.  Marland Kitchen tiotropium (SPIRIVA) 18 MCG inhalation capsule Place 1 capsule (18 mcg total) into inhaler and inhale daily.  Marland Kitchen tiZANidine (ZANAFLEX) 2 MG tablet Take 2 mg by mouth at bedtime.   No facility-administered encounter medications on file as of 01/14/2016.     Functional Status:  In your present state of health, do you have any difficulty performing the following activities: 12/31/2015 12/16/2015  Hearing? N N  Vision? Y N  Difficulty concentrating or making decisions? N Y  Walking or climbing stairs? Y Y  Dressing or bathing? Y Y  Doing errands, shopping? [name]  Preparing Food and eating ? N -  Using the Toilet? N -  In the past six months, have  you accidently leaked urine? Y -  Do you have problems with loss of bowel control? Y -  Managing your Medications? N -  Managing your Finances? Y -  Housekeeping or managing your Housekeeping? Y -  Some recent data might be hidden    Fall/Depression Screening:  PHQ 2/9 Scores 12/31/2015 05/22/2015 03/31/2015 02/20/2015 02/20/2015 11/19/2014 10/25/2014  PHQ - 2 Score 0 0 0 0 0 0 0    Assessment: [name] left message for patient's son for update. [name] spoke with [name] who states plan is for dc to home today with [name] and a wheelchair.  [name] will  advise [name] RNCM and plan follow up call and visit as needed.   Plan: Await callback from son- Will advise [name] of dc plans to home today.   Eduard Clos, MSW, [place] Worker  [place] [phone]

## 2016-01-15 DIAGNOSIS — Z9981 Dependence on supplemental oxygen: Secondary | ICD-10-CM | POA: Diagnosis not present

## 2016-01-15 DIAGNOSIS — I129 Hypertensive chronic kidney disease with stage 1 through stage 4 chronic kidney disease, or unspecified chronic kidney disease: Secondary | ICD-10-CM | POA: Diagnosis not present

## 2016-01-15 DIAGNOSIS — N189 Chronic kidney disease, unspecified: Secondary | ICD-10-CM | POA: Diagnosis not present

## 2016-01-15 DIAGNOSIS — S32000S Wedge compression fracture of unspecified lumbar vertebra, sequela: Secondary | ICD-10-CM | POA: Diagnosis not present

## 2016-01-15 DIAGNOSIS — J449 Chronic obstructive pulmonary disease, unspecified: Secondary | ICD-10-CM | POA: Diagnosis not present

## 2016-01-15 DIAGNOSIS — Z7901 Long term (current) use of anticoagulants: Secondary | ICD-10-CM | POA: Diagnosis not present

## 2016-01-15 DIAGNOSIS — I2510 Atherosclerotic heart disease of native coronary artery without angina pectoris: Secondary | ICD-10-CM | POA: Diagnosis not present

## 2016-01-15 DIAGNOSIS — R32 Unspecified urinary incontinence: Secondary | ICD-10-CM | POA: Diagnosis not present

## 2016-01-15 DIAGNOSIS — J45909 Unspecified asthma, uncomplicated: Secondary | ICD-10-CM | POA: Diagnosis not present

## 2016-01-16 ENCOUNTER — Other Ambulatory Visit: Payer: Self-pay

## 2016-01-16 ENCOUNTER — Other Ambulatory Visit: Payer: Self-pay | Admitting: *Deleted

## 2016-01-16 DIAGNOSIS — R609 Edema, unspecified: Secondary | ICD-10-CM | POA: Diagnosis not present

## 2016-01-16 DIAGNOSIS — I252 Old myocardial infarction: Secondary | ICD-10-CM | POA: Diagnosis not present

## 2016-01-16 DIAGNOSIS — K219 Gastro-esophageal reflux disease without esophagitis: Secondary | ICD-10-CM | POA: Diagnosis not present

## 2016-01-16 DIAGNOSIS — Z79899 Other long term (current) drug therapy: Secondary | ICD-10-CM | POA: Diagnosis not present

## 2016-01-16 DIAGNOSIS — I2510 Atherosclerotic heart disease of native coronary artery without angina pectoris: Secondary | ICD-10-CM | POA: Diagnosis not present

## 2016-01-16 DIAGNOSIS — R32 Unspecified urinary incontinence: Secondary | ICD-10-CM | POA: Diagnosis not present

## 2016-01-16 DIAGNOSIS — I10 Essential (primary) hypertension: Secondary | ICD-10-CM | POA: Diagnosis not present

## 2016-01-16 DIAGNOSIS — I480 Paroxysmal atrial fibrillation: Secondary | ICD-10-CM | POA: Diagnosis not present

## 2016-01-16 DIAGNOSIS — J449 Chronic obstructive pulmonary disease, unspecified: Secondary | ICD-10-CM | POA: Diagnosis not present

## 2016-01-16 NOTE — Patient Outreach (Signed)
[name]) Care Management  01/16/2016  [name] [name] March 26, 1940 [id number]   [name] spoke to patients son who reports he has visited her today and she is "not herself"- cognitively or physically.  They are currently at the doctors office having her checked out.    Asked son to update me after visit. [name] will update [facility] RNCM also.   Eduard Clos, MSW, [place] Worker  [place] [phone]

## 2016-01-16 NOTE — Patient Outreach (Signed)
Transition of care call: Discharged from [facility] on 01/14/2016 per patients report.  Placed call to patient and explained purpose of call. Patient reports that she is not doing well. States that she can not get up and down.  Reports she is using her walker. Reports breathing is bad but about the same as it always is.   Reports that she is on her oxygen at 3 liters all the time.   Denies follow up appointment with primary MD. States that Dr. Lisbeth Ply is no longer her doctor and she has been assigned to someone else who she does not know. Reports that she does not have a follow up planned.  Patient reports that she has not heard from [place] and declines for me to call. States that her son is coming to her home and she is hoping to go to his house.  Inquired about medications and patient reports that she has all her medications and is taking them correctly. Offered to review medications and patient declined.    Offered to assist patient and she has declined and states that my son is coming.  Reviewed with patient that her assigned case manager is Landis Martins and she would be in touch next week.  Impression:  Patient appears aggravated on the phone and has declined my assistance via phone today. Will communicate this to Landis Martins  for follow up next week. Placed call to [place] worker to inquire how patient was in with mobility at [facility]. Awaiting a call back.  PLAN: Will provide report to Landis Martins upon her return to the office on 01/19/2016. Will send barrier letter to MD and route this visit to MD. [facility] CM Care Plan Problem One   Flowsheet Row Most Recent Value  Care Plan Problem One  Recent discharge from SNF for COPD exacerabation.  Role Documenting the Problem One  Care Management [place] for Problem One  Active  [name] Long Term Goal (31-90 days)  Patient will report no readmissions to the hospital in the next 31 days.  [name] Long Term Goal Start Date   01/16/16  Interventions for Problem One Long Term Goal  Reviewed discharge with patient. Offered to call MD for follow up appointment and patient declined. Offered to call Home health agency and patient declined.Reviewed transition of care program.  [name] CM Short Term Goal #1 (0-30 days)  Patient will report attending follow up appointment with primary Md in the next 10 days.  [name] CM Short Term Goal #1 Start Date  01/16/16  Interventions for Short Term Goal #1  Reviewed importance of timely follow up with primary Md. Encouraged patient to call and make an appointment.  [name] CM Short Term Goal #2 (0-30 days)  Patient will report improved mobility in the next 30 days.  [name] CM Short Term Goal #2 Start Date  01/16/16  Interventions for Short Term Goal #2  Reviewed importance of good nutrition, rest and physical therapy.       Tomasa Rand, RN, BSN, CEN [facility] [employer] [phone]

## 2016-01-17 ENCOUNTER — Encounter (HOSPITAL_COMMUNITY): Payer: Self-pay | Admitting: Emergency Medicine

## 2016-01-17 ENCOUNTER — Inpatient Hospital Stay (HOSPITAL_COMMUNITY)
Admission: EM | Admit: 2016-01-17 | Discharge: 2016-01-22 | DRG: 190 | Disposition: A | Discharge status: Skilled Nursing Facility | Payer: Commercial Managed Care - HMO | Attending: Internal Medicine | Admitting: Internal Medicine

## 2016-01-17 ENCOUNTER — Emergency Department (HOSPITAL_COMMUNITY): Payer: Commercial Managed Care - HMO

## 2016-01-17 DIAGNOSIS — Z7983 Long term (current) use of bisphosphonates: Secondary | ICD-10-CM

## 2016-01-17 DIAGNOSIS — M6281 Muscle weakness (generalized): Secondary | ICD-10-CM | POA: Diagnosis not present

## 2016-01-17 DIAGNOSIS — J9622 Acute and chronic respiratory failure with hypercapnia: Secondary | ICD-10-CM | POA: Diagnosis present

## 2016-01-17 DIAGNOSIS — E875 Hyperkalemia: Secondary | ICD-10-CM | POA: Diagnosis not present

## 2016-01-17 DIAGNOSIS — I2510 Atherosclerotic heart disease of native coronary artery without angina pectoris: Secondary | ICD-10-CM | POA: Diagnosis not present

## 2016-01-17 DIAGNOSIS — Z9981 Dependence on supplemental oxygen: Secondary | ICD-10-CM

## 2016-01-17 DIAGNOSIS — Z66 Do not resuscitate: Secondary | ICD-10-CM | POA: Diagnosis present

## 2016-01-17 DIAGNOSIS — I5031 Acute diastolic (congestive) heart failure: Secondary | ICD-10-CM | POA: Diagnosis not present

## 2016-01-17 DIAGNOSIS — R64 Cachexia: Secondary | ICD-10-CM | POA: Diagnosis not present

## 2016-01-17 DIAGNOSIS — J9611 Chronic respiratory failure with hypoxia: Secondary | ICD-10-CM | POA: Diagnosis not present

## 2016-01-17 DIAGNOSIS — K219 Gastro-esophageal reflux disease without esophagitis: Secondary | ICD-10-CM | POA: Diagnosis present

## 2016-01-17 DIAGNOSIS — E782 Mixed hyperlipidemia: Secondary | ICD-10-CM | POA: Diagnosis present

## 2016-01-17 DIAGNOSIS — Z6824 Body mass index (BMI) 24.0-24.9, adult: Secondary | ICD-10-CM | POA: Diagnosis not present

## 2016-01-17 DIAGNOSIS — R609 Edema, unspecified: Secondary | ICD-10-CM

## 2016-01-17 DIAGNOSIS — I130 Hypertensive heart and chronic kidney disease with heart failure and stage 1 through stage 4 chronic kidney disease, or unspecified chronic kidney disease: Secondary | ICD-10-CM | POA: Diagnosis present

## 2016-01-17 DIAGNOSIS — Z86711 Personal history of pulmonary embolism: Secondary | ICD-10-CM | POA: Diagnosis not present

## 2016-01-17 DIAGNOSIS — J90 Pleural effusion, not elsewhere classified: Secondary | ICD-10-CM

## 2016-01-17 DIAGNOSIS — N181 Chronic kidney disease, stage 1: Secondary | ICD-10-CM | POA: Diagnosis present

## 2016-01-17 DIAGNOSIS — R197 Diarrhea, unspecified: Secondary | ICD-10-CM | POA: Diagnosis present

## 2016-01-17 DIAGNOSIS — F329 Major depressive disorder, single episode, unspecified: Secondary | ICD-10-CM | POA: Diagnosis present

## 2016-01-17 DIAGNOSIS — Z87891 Personal history of nicotine dependence: Secondary | ICD-10-CM | POA: Diagnosis not present

## 2016-01-17 DIAGNOSIS — D649 Anemia, unspecified: Secondary | ICD-10-CM | POA: Diagnosis present

## 2016-01-17 DIAGNOSIS — I4891 Unspecified atrial fibrillation: Secondary | ICD-10-CM | POA: Diagnosis not present

## 2016-01-17 DIAGNOSIS — E785 Hyperlipidemia, unspecified: Secondary | ICD-10-CM | POA: Diagnosis present

## 2016-01-17 DIAGNOSIS — Z8249 Family history of ischemic heart disease and other diseases of the circulatory system: Secondary | ICD-10-CM

## 2016-01-17 DIAGNOSIS — J9621 Acute and chronic respiratory failure with hypoxia: Secondary | ICD-10-CM | POA: Diagnosis not present

## 2016-01-17 DIAGNOSIS — J9612 Chronic respiratory failure with hypercapnia: Secondary | ICD-10-CM | POA: Diagnosis not present

## 2016-01-17 DIAGNOSIS — E440 Moderate protein-calorie malnutrition: Secondary | ICD-10-CM | POA: Diagnosis present

## 2016-01-17 DIAGNOSIS — I509 Heart failure, unspecified: Secondary | ICD-10-CM

## 2016-01-17 DIAGNOSIS — R262 Difficulty in walking, not elsewhere classified: Secondary | ICD-10-CM | POA: Diagnosis not present

## 2016-01-17 DIAGNOSIS — R627 Adult failure to thrive: Secondary | ICD-10-CM | POA: Diagnosis present

## 2016-01-17 DIAGNOSIS — J449 Chronic obstructive pulmonary disease, unspecified: Secondary | ICD-10-CM

## 2016-01-17 DIAGNOSIS — I10 Essential (primary) hypertension: Secondary | ICD-10-CM | POA: Diagnosis present

## 2016-01-17 DIAGNOSIS — I5033 Acute on chronic diastolic (congestive) heart failure: Secondary | ICD-10-CM | POA: Diagnosis present

## 2016-01-17 DIAGNOSIS — R41841 Cognitive communication deficit: Secondary | ICD-10-CM | POA: Diagnosis not present

## 2016-01-17 DIAGNOSIS — D696 Thrombocytopenia, unspecified: Secondary | ICD-10-CM | POA: Diagnosis not present

## 2016-01-17 DIAGNOSIS — I482 Chronic atrial fibrillation: Secondary | ICD-10-CM | POA: Diagnosis not present

## 2016-01-17 DIAGNOSIS — J441 Chronic obstructive pulmonary disease with (acute) exacerbation: Principal | ICD-10-CM | POA: Diagnosis present

## 2016-01-17 DIAGNOSIS — R0602 Shortness of breath: Secondary | ICD-10-CM | POA: Diagnosis not present

## 2016-01-17 DIAGNOSIS — I5032 Chronic diastolic (congestive) heart failure: Secondary | ICD-10-CM | POA: Diagnosis present

## 2016-01-17 DIAGNOSIS — Z79899 Other long term (current) drug therapy: Secondary | ICD-10-CM

## 2016-01-17 DIAGNOSIS — R1311 Dysphagia, oral phase: Secondary | ICD-10-CM | POA: Diagnosis not present

## 2016-01-17 DIAGNOSIS — R278 Other lack of coordination: Secondary | ICD-10-CM | POA: Diagnosis not present

## 2016-01-17 DIAGNOSIS — J811 Chronic pulmonary edema: Secondary | ICD-10-CM | POA: Diagnosis not present

## 2016-01-17 LAB — CBC WITH DIFFERENTIAL/PLATELET
Basophils Absolute: 0 10*3/uL (ref 0.0–0.1)
Basophils Relative: 0 %
EOS ABS: 0 10*3/uL (ref 0.0–0.7)
EOS PCT: 0 %
HCT: 47.9 % — ABNORMAL HIGH (ref 36.0–46.0)
HEMOGLOBIN: 13.8 g/dL (ref 12.0–15.0)
LYMPHS ABS: 1 10*3/uL (ref 0.7–4.0)
LYMPHS PCT: 11 %
MCH: 27 pg (ref 26.0–34.0)
MCHC: 28.8 g/dL — AB (ref 30.0–36.0)
MCV: 93.7 fL (ref 78.0–100.0)
MONOS PCT: 10 %
Monocytes Absolute: 0.9 10*3/uL (ref 0.1–1.0)
NEUTROS PCT: 79 %
Neutro Abs: 6.9 10*3/uL (ref 1.7–7.7)
Platelets: 140 10*3/uL — ABNORMAL LOW (ref 150–400)
RBC: 5.11 MIL/uL (ref 3.87–5.11)
RDW: 18.6 % — ABNORMAL HIGH (ref 11.5–15.5)
WBC: 8.7 10*3/uL (ref 4.0–10.5)

## 2016-01-17 LAB — BASIC METABOLIC PANEL
Anion gap: 9 (ref 5–15)
BUN: 11 mg/dL (ref 6–20)
CHLORIDE: 99 mmol/L — AB (ref 101–111)
CO2: 37 mmol/L — AB (ref 22–32)
CREATININE: 0.52 mg/dL (ref 0.44–1.00)
Calcium: 9.6 mg/dL (ref 8.9–10.3)
GFR calc Af Amer: >60 mL/min (ref >60)
GFR calc non Af Amer: >60 mL/min (ref >60)
Glucose, Bld: 132 mg/dL — ABNORMAL HIGH (ref 65–99)
POTASSIUM: 4.1 mmol/L (ref 3.5–5.1)
SODIUM: 145 mmol/L (ref 135–145)

## 2016-01-17 LAB — I-STAT TROPONIN, ED: Troponin i, poc: 0.02 ng/mL (ref 0.00–0.08)

## 2016-01-17 LAB — TSH: TSH: 1.249 u[IU]/mL (ref 0.350–4.500)

## 2016-01-17 LAB — DIGOXIN LEVEL: Digoxin Level: 0.6 ng/mL — ABNORMAL LOW (ref 0.8–2.0)

## 2016-01-17 LAB — T4, FREE: FREE T4: 1.27 ng/dL — AB (ref 0.61–1.12)

## 2016-01-17 LAB — BRAIN NATRIURETIC PEPTIDE: B Natriuretic Peptide: 410.2 pg/mL — ABNORMAL HIGH (ref 0.0–100.0)

## 2016-01-17 LAB — MAGNESIUM: MAGNESIUM: 1.8 mg/dL (ref 1.7–2.4)

## 2016-01-17 MED ORDER — METHYLPREDNISOLONE SODIUM SUCC 125 MG IJ SOLR
60.0000 mg | Freq: Four times a day (QID) | INTRAMUSCULAR | Status: DC
  Administered 2016-01-17 – 2016-01-18 (×5): 60 mg via INTRAVENOUS
  Filled 2016-01-17 (×5): qty 2

## 2016-01-17 MED ORDER — CALCITONIN (SALMON) 200 UNIT/ACT NA SOLN
1.0000 | Freq: Every day | NASAL | Status: DC
  Administered 2016-01-18 – 2016-01-22 (×5): 1 via NASAL
  Filled 2016-01-17: qty 3.7

## 2016-01-17 MED ORDER — POLYETHYLENE GLYCOL 3350 17 G PO PACK
17.0000 g | PACK | Freq: Every day | ORAL | Status: DC
  Administered 2016-01-17 – 2016-01-20 (×4): 17 g via ORAL
  Filled 2016-01-17 (×4): qty 1

## 2016-01-17 MED ORDER — FLUTICASONE PROPIONATE 50 MCG/ACT NA SUSP
1.0000 | Freq: Every day | NASAL | Status: DC | PRN
  Filled 2016-01-17: qty 16

## 2016-01-17 MED ORDER — DOCUSATE SODIUM 100 MG PO CAPS
100.0000 mg | ORAL_CAPSULE | Freq: Two times a day (BID) | ORAL | Status: DC
  Administered 2016-01-17 – 2016-01-20 (×7): 100 mg via ORAL
  Filled 2016-01-17 (×8): qty 1

## 2016-01-17 MED ORDER — IPRATROPIUM-ALBUTEROL 0.5-2.5 (3) MG/3ML IN SOLN
3.0000 mL | Freq: Four times a day (QID) | RESPIRATORY_TRACT | Status: DC
  Administered 2016-01-17 – 2016-01-18 (×3): 3 mL via RESPIRATORY_TRACT
  Filled 2016-01-17 (×3): qty 3

## 2016-01-17 MED ORDER — METOPROLOL TARTRATE 50 MG PO TABS
50.0000 mg | ORAL_TABLET | Freq: Two times a day (BID) | ORAL | Status: DC
  Administered 2016-01-18 – 2016-01-22 (×9): 50 mg via ORAL
  Filled 2016-01-17 (×10): qty 1

## 2016-01-17 MED ORDER — CYANOCOBALAMIN 500 MCG PO TABS
500.0000 ug | ORAL_TABLET | Freq: Every day | ORAL | Status: DC
  Administered 2016-01-18 – 2016-01-22 (×5): 500 ug via ORAL
  Filled 2016-01-17 (×6): qty 1

## 2016-01-17 MED ORDER — TIOTROPIUM BROMIDE MONOHYDRATE 18 MCG IN CAPS
18.0000 ug | ORAL_CAPSULE | Freq: Every day | RESPIRATORY_TRACT | Status: DC
  Administered 2016-01-18 – 2016-01-22 (×5): 18 ug via RESPIRATORY_TRACT
  Filled 2016-01-17 (×2): qty 5

## 2016-01-17 MED ORDER — FUROSEMIDE 10 MG/ML IJ SOLN
40.0000 mg | Freq: Once | INTRAMUSCULAR | Status: AC
Start: 2016-01-17 — End: 2016-01-17
  Administered 2016-01-17: 40 mg via INTRAVENOUS
  Filled 2016-01-17: qty 4

## 2016-01-17 MED ORDER — DILTIAZEM HCL 100 MG IV SOLR
5.0000 mg/h | INTRAVENOUS | Status: DC
  Administered 2016-01-17 (×2): 15 mg/h via INTRAVENOUS
  Administered 2016-01-18 (×2): 5 mg/h via INTRAVENOUS
  Filled 2016-01-17 (×3): qty 100

## 2016-01-17 MED ORDER — ALBUTEROL SULFATE (2.5 MG/3ML) 0.083% IN NEBU: 5.0000 mg | INHALATION_SOLUTION | Freq: Once | RESPIRATORY_TRACT | Status: DC

## 2016-01-17 MED ORDER — LISINOPRIL 2.5 MG PO TABS
2.5000 mg | ORAL_TABLET | Freq: Every day | ORAL | Status: DC
  Administered 2016-01-17: 2.5 mg via ORAL
  Filled 2016-01-17 (×2): qty 1

## 2016-01-17 MED ORDER — DABIGATRAN ETEXILATE MESYLATE 150 MG PO CAPS
150.0000 mg | ORAL_CAPSULE | Freq: Two times a day (BID) | ORAL | Status: DC
  Administered 2016-01-17 – 2016-01-22 (×11): 150 mg via ORAL
  Filled 2016-01-17 (×12): qty 1

## 2016-01-17 MED ORDER — FUROSEMIDE 10 MG/ML IJ SOLN
20.0000 mg | Freq: Two times a day (BID) | INTRAMUSCULAR | Status: DC
  Administered 2016-01-17 – 2016-01-19 (×3): 20 mg via INTRAVENOUS
  Filled 2016-01-17 (×5): qty 2

## 2016-01-17 MED ORDER — FUROSEMIDE 10 MG/ML IJ SOLN: 40.0000 mg | Freq: Two times a day (BID) | INTRAMUSCULAR | Status: DC

## 2016-01-17 MED ORDER — HYDROCODONE-ACETAMINOPHEN 5-325 MG PO TABS
1.0000 | ORAL_TABLET | Freq: Four times a day (QID) | ORAL | Status: DC | PRN
  Administered 2016-01-18 – 2016-01-22 (×9): 1 via ORAL
  Filled 2016-01-17 (×9): qty 1

## 2016-01-17 MED ORDER — METOPROLOL TARTRATE 25 MG PO TABS: 50.0000 mg | ORAL_TABLET | Freq: Two times a day (BID) | ORAL | Status: DC

## 2016-01-17 MED ORDER — DILTIAZEM HCL 25 MG/5ML IV SOLN
10.0000 mg | Freq: Once | INTRAVENOUS | Status: AC
  Administered 2016-01-17: 10 mg via INTRAVENOUS
  Filled 2016-01-17: qty 5

## 2016-01-17 MED ORDER — PANTOPRAZOLE SODIUM 40 MG PO TBEC
40.0000 mg | DELAYED_RELEASE_TABLET | Freq: Every day | ORAL | Status: DC
  Administered 2016-01-17 – 2016-01-22 (×6): 40 mg via ORAL
  Filled 2016-01-17 (×6): qty 1

## 2016-01-17 MED ORDER — METHYLPREDNISOLONE SODIUM SUCC 125 MG IJ SOLR
125.0000 mg | Freq: Once | INTRAMUSCULAR | Status: AC
  Administered 2016-01-17: 125 mg via INTRAVENOUS
  Filled 2016-01-17: qty 2

## 2016-01-17 MED ORDER — LEVALBUTEROL HCL 0.63 MG/3ML IN NEBU: 0.6300 mg | INHALATION_SOLUTION | Freq: Four times a day (QID) | RESPIRATORY_TRACT | Status: DC | PRN

## 2016-01-17 MED ORDER — OXYBUTYNIN CHLORIDE ER 10 MG PO TB24
10.0000 mg | ORAL_TABLET | Freq: Every day | ORAL | Status: DC
  Administered 2016-01-17 – 2016-01-21 (×5): 10 mg via ORAL
  Filled 2016-01-17 (×5): qty 1

## 2016-01-17 MED ORDER — IPRATROPIUM-ALBUTEROL 0.5-2.5 (3) MG/3ML IN SOLN
3.0000 mL | Freq: Once | RESPIRATORY_TRACT | Status: AC
  Administered 2016-01-17: 3 mL via RESPIRATORY_TRACT
  Filled 2016-01-17: qty 3

## 2016-01-17 MED ORDER — OMEGA-3-ACID ETHYL ESTERS 1 G PO CAPS
1.0000 g | ORAL_CAPSULE | Freq: Every day | ORAL | Status: DC
  Administered 2016-01-17 – 2016-01-22 (×6): 1 g via ORAL
  Filled 2016-01-17 (×6): qty 1

## 2016-01-17 MED ORDER — DIGOXIN 125 MCG PO TABS
0.1250 mg | ORAL_TABLET | Freq: Every day | ORAL | Status: DC
  Administered 2016-01-17 – 2016-01-22 (×6): 0.125 mg via ORAL
  Filled 2016-01-17 (×6): qty 1

## 2016-01-17 MED ORDER — ACETAMINOPHEN 325 MG PO TABS
650.0000 mg | ORAL_TABLET | Freq: Once | ORAL | Status: AC
  Administered 2016-01-17: 650 mg via ORAL
  Filled 2016-01-17: qty 2

## 2016-01-17 MED ORDER — SERTRALINE HCL 50 MG PO TABS
25.0000 mg | ORAL_TABLET | Freq: Every day | ORAL | Status: DC
  Administered 2016-01-17 – 2016-01-21 (×5): 25 mg via ORAL
  Filled 2016-01-17 (×6): qty 1

## 2016-01-17 MED ORDER — TIZANIDINE HCL 2 MG PO TABS
2.0000 mg | ORAL_TABLET | Freq: Every day | ORAL | Status: DC
  Administered 2016-01-17 – 2016-01-21 (×5): 2 mg via ORAL
  Filled 2016-01-17 (×5): qty 1

## 2016-01-17 MED ORDER — ENSURE ENLIVE PO LIQD
237.0000 mL | Freq: Two times a day (BID) | ORAL | Status: DC
  Administered 2016-01-18 – 2016-01-22 (×7): 237 mL via ORAL

## 2016-01-17 MED ORDER — DEXTROSE 5 % IV SOLN
5.0000 mg/h | Freq: Once | INTRAVENOUS | Status: AC
  Administered 2016-01-17: 5 mg/h via INTRAVENOUS
  Filled 2016-01-17: qty 100

## 2016-01-17 MED ORDER — ONDANSETRON HCL 4 MG/5ML PO SOLN
4.0000 mg | Freq: Once | ORAL | Status: AC
  Administered 2016-01-17: 4 mg via ORAL
  Filled 2016-01-17: qty 5

## 2016-01-17 MED ORDER — FERROUS SULFATE 325 (65 FE) MG PO TABS
325.0000 mg | ORAL_TABLET | Freq: Every day | ORAL | Status: DC
  Administered 2016-01-17 – 2016-01-22 (×6): 325 mg via ORAL
  Filled 2016-01-17 (×7): qty 1

## 2016-01-17 MED ORDER — ATORVASTATIN CALCIUM 40 MG PO TABS
40.0000 mg | ORAL_TABLET | Freq: Every morning | ORAL | Status: DC
  Administered 2016-01-17 – 2016-01-22 (×6): 40 mg via ORAL
  Filled 2016-01-17 (×6): qty 1

## 2016-01-17 MED ORDER — ASPIRIN EFFERVESCENT 325 MG PO TBEF: 325.0000 mg | EFFERVESCENT_TABLET | Freq: Four times a day (QID) | ORAL | Status: DC | PRN

## 2016-01-17 MED ORDER — ONDANSETRON HCL 4 MG/2ML IJ SOLN
4.0000 mg | Freq: Three times a day (TID) | INTRAMUSCULAR | Status: AC | PRN
  Filled 2016-01-17: qty 2

## 2016-01-17 MED ORDER — IPRATROPIUM-ALBUTEROL 0.5-2.5 (3) MG/3ML IN SOLN
3.0000 mL | Freq: Four times a day (QID) | RESPIRATORY_TRACT | Status: DC
  Administered 2016-01-17: 3 mL via RESPIRATORY_TRACT
  Filled 2016-01-17: qty 3

## 2016-01-17 MED ORDER — GUAIFENESIN ER 600 MG PO TB12
1200.0000 mg | ORAL_TABLET | Freq: Two times a day (BID) | ORAL | Status: DC
  Administered 2016-01-17 – 2016-01-22 (×11): 1200 mg via ORAL
  Filled 2016-01-17 (×11): qty 2

## 2016-01-17 NOTE — ED Triage Notes (Signed)
Pt co shob. Went to PCP yesterday because of swollen legs. PCP gave lasik. Pt wears 3L 02 aLL THE TIME

## 2016-01-17 NOTE — ED Provider Notes (Signed)
[facility] DEPT Provider Note   CSN: [id number] Arrival date & time: 01/17/16  [id number]     History   Chief Complaint Chief Complaint  Patient presents with  . Shortness of Breath    HPI [name] [name] is a 75 y.o. female.  HPI [name] [name] is a 75 y.o. female with hx of COPD, GERD, HTN, anemia, Afib, pneumonia, presents to ED with complaint of shortness of breath. Pt admitted for the same about a month ago. Was discharged to SNF. Pt states "they leg me go because she said I was better." Pt reports that she noticed increased swelling in legs several days ago. States shortness of breath worsened today. Pt went to see PCP yesterday. They increased her lasix. Pt admits that sometimes she does not take lasix as prescribed because "it caused me to wet myself." Pt states she is having trouble ambulating because of shortness of breath. She reports compliance with all other medications. She is on home O2 @3L  at all times.   Past Medical History:  Diagnosis Date  . Anemia   . Arthritis    "back" (11/21/2014)  . Asthma   . Blood dyscrasia    hx blood clotts  . Chronic bronchitis ([place])   . Chronic kidney disease   . Chronic lower back pain   . 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 GERD (gastroesophageal reflux disease)   . [id number])    "a few times/year" (11/21/2014)  . History of blood transfusion X 2   "related to blood thinner I was taking"  . Hyperlipidemia   . Hypertension   . Migraine    "weekly probably" (11/21/2014)  . On home oxygen therapy    "3L; 24/7" (11/21/2014)  . Pneumonia    hx pneumonia   . Pneumothorax on right 09/2014  . Pulmonary embolism ([place])   . Shortness of breath   . Urinary incontinence     Patient Active Problem List   Diagnosis Date Noted  . Atrial fibrillation with RVR ([place]) 12/10/2015  . Acute diastolic heart failure ([place]) 12/10/2015  . Dysuria 12/10/2015  . HLD (hyperlipidemia) 12/10/2015  . Depression  12/10/2015  . GERD (gastroesophageal reflux disease) 12/10/2015  . Pneumothorax, right 11/20/2014  . Pneumothorax   . Recurrent spontaneous pneumothorax   . Pneumothorax on right 10/09/2014  . Pneumothorax on left 09/14/2014  . Essential hypertension   . Microcytic anemia 04/27/2014  . Anemia due to acute blood loss 08/03/2013  . Paroxysmal atrial fibrillation ([place]) 02/03/2012  . Fracture of right hip ([place]) 01/29/2012  . Fall due to stumbling 01/29/2012  . CAD (coronary artery disease) 01/29/2012  . COPD (chronic obstructive pulmonary disease) ([place]) 01/29/2012  . Osteoarthritis 01/29/2012  . Closed fracture of right distal radius 10/13/2011    Class: Acute    Past Surgical History:  Procedure Laterality Date  . ABDOMINAL HYSTERECTOMY    . CATARACT EXTRACTION W/ INTRAOCULAR LENS  IMPLANT, BILATERAL Bilateral   . CYSTOSCOPY W/ RETROGRADES Bilateral 06/11/2014   Procedure: CYSTOSCOPY WITH RETROGRADE PYELOGRAM;  Surgeon: [name], MD;  Location: [facility];  Service: Urology;  Laterality: Bilateral;  . FRACTURE SURGERY    . HARDWARE REMOVAL  01/29/2012   Procedure: HARDWARE REMOVAL;  Surgeon: [name], MD;  Location: [facility];  Service: Orthopedics;  Laterality: Right;  . HEMIARTHROPLASTY SHOULDER FRACTURE Right   . HIP ARTHROPLASTY  01/29/2012   Procedure: ARTHROPLASTY BIPOLAR HIP;  Surgeon: Illene Regulus  Sharol Given, MD;  Location: [facility];  Service: Orthopedics;  Laterality: Right;  . LAPAROSCOPIC CHOLECYSTECTOMY    . ORIF WRIST FRACTURE  10/13/2011   Procedure: OPEN REDUCTION INTERNAL FIXATION (ORIF) WRIST FRACTURE;  Surgeon: [name], MD;  Location: [facility];  Service: Orthopedics;  Laterality: Right;  Open Reduction Internal Fixation Right Distal Radius   . TRANSURETHRAL RESECTION OF BLADDER TUMOR N/A 06/11/2014   Procedure: TRANSURETHRAL RESECTION OF BLADDER TUMOR (TURBT);  Surgeon: [name], MD;  Location: [facility];  Service: Urology;  Laterality: N/A;  . TUBAL LIGATION      OB  History    No data available       Home Medications    Prior to Admission medications   Medication Sig Start Date End Date Taking? Authorizing Provider  albuterol (PROVENTIL HFA;VENTOLIN HFA) 108 (90 Base) MCG/ACT inhaler Inhale 2 puffs into the lungs 4 (four) times daily as needed for wheezing or shortness of breath.   Yes Historical Provider, MD  albuterol (PROVENTIL) (2.5 MG/3ML) 0.083% nebulizer solution Take 2.5 mg by nebulization every 6 (six) hours as needed for wheezing or shortness of breath.    Yes Historical Provider, MD  alendronate (FOSAMAX) 70 MG tablet Take 70 mg by mouth once a week. Take with a full glass of water on an empty stomach.   Yes Historical Provider, MD  aspirin-sod bicarb-citric acid (ALKA-SELTZER) 325 MG TBEF tablet Take 325 mg by mouth every 6 (six) hours as needed (for stomach).   Yes Historical Provider, MD  atorvastatin (LIPITOR) 40 MG tablet Take 40 mg by mouth every morning.  07/02/13  Yes Historical Provider, MD  calcitonin, salmon, (MIACALCIN/FORTICAL) 200 UNIT/ACT nasal spray Place 1 spray into alternate nostrils daily. 12/08/15  Yes Historical Provider, MD  Cranberry-Vitamin C-Probiotic (AZO CRANBERRY PO) Take 2 capsules by mouth daily.   Yes Historical Provider, MD  cyanocobalamin 500 MCG tablet Take 500 mcg by mouth daily.   Yes Historical Provider, MD  dabigatran (PRADAXA) 150 MG CAPS capsule Take 150 mg by mouth 2 (two) times daily.   Yes Historical Provider, MD  digoxin (LANOXIN) 0.125 MG tablet Take 1 tablet (0.125 mg total) by mouth daily. 12/17/15  Yes Verlee Monte, MD  diltiazem (CARDIZEM CD) 360 MG 24 hr capsule Take 1 capsule (360 mg total) by mouth every morning. 12/18/15  Yes Mahima Bubba Camp, MD  docusate sodium (COLACE) 100 MG capsule Take 1 capsule (100 mg total) by mouth every 12 (twelve) hours. 10/21/15  Yes Tanna Furry, MD  ferrous sulfate 325 (65 FE) MG tablet Take 325 mg by mouth daily with breakfast.   Yes Historical Provider, MD  fluticasone  (FLONASE) 50 MCG/ACT nasal spray Place 1 spray into both nostrils daily as needed for allergies or rhinitis. 08/06/13  Yes Barton Dubois, MD  furosemide (LASIX) 20 MG tablet Take 1 tablet (20 mg total) by mouth daily. 06/13/14  Yes Theodis Blaze, MD  guaiFENesin (MUCINEX) 600 MG 12 hr tablet Take 2 tablets (1,200 mg total) by mouth 2 (two) times daily. 12/16/15  Yes Verlee Monte, MD  HYDROcodone-acetaminophen (NORCO/VICODIN) 5-325 MG tablet Take 1 tablet by mouth every 4 (four) hours as needed. 01/02/16  Yes Tiffany L Reed, DO  metoprolol (LOPRESSOR) 50 MG tablet Take 1 tablet (50 mg total) by mouth 2 (two) times daily. 12/16/15  Yes Verlee Monte, MD  Omega-3 Fatty Acids (FISH OIL) 1000 MG CAPS Take 1,000 mg by mouth daily.    Yes Historical Provider, MD  oxybutynin (  DITROPAN-XL) 5 MG 24 hr tablet Take 10 mg by mouth at bedtime.   Yes Historical Provider, MD  pantoprazole (PROTONIX) 40 MG tablet Take 1 tablet (40 mg total) by mouth daily. [id number]  Yes Delora Fuel, MD  polyethylene glycol [facility] / GLYCOLAX) packet Take 17 g by mouth daily. 12/17/15  Yes Verlee Monte, MD  sertraline (ZOLOFT) 25 MG tablet Take 1 tablet (25 mg total) by mouth at bedtime. 09/24/15  Yes Juliet Rude, MD  tiotropium (SPIRIVA) 18 MCG inhalation capsule Place 1 capsule (18 mcg total) into inhaler and inhale daily. 08/06/13  Yes Barton Dubois, MD  tiZANidine (ZANAFLEX) 2 MG tablet Take 2 mg by mouth at bedtime.   Yes Historical Provider, MD    Family History Family History  Problem Relation Age of Onset  . Cancer - Lung Mother   . Coronary artery disease Mother   . Coronary artery disease Sister   . Coronary artery disease Brother     Social History Social History  Substance Use Topics  . Smoking status: Former Smoker    Packs/day: 1.00    Years: 56.00    Types: Cigarettes    Quit date: 02/12/2014  . Smokeless tobacco: Never Used  . Alcohol use No     Allergies   Eggs or egg-derived products   Review of  Systems Review of Systems  Constitutional: Positive for fatigue. Negative for chills and fever.  Respiratory: Positive for shortness of breath. Negative for cough and chest tightness.   Cardiovascular: Positive for leg swelling. Negative for chest pain and palpitations.  Gastrointestinal: Negative for abdominal pain, diarrhea, nausea and vomiting.  Genitourinary: Negative for dysuria and flank pain.  Musculoskeletal: Negative for arthralgias, myalgias, neck pain and neck stiffness.  Skin: Negative for rash.  Neurological: Positive for weakness. Negative for dizziness and headaches.  All other systems reviewed and are negative.    Physical Exam Updated Vital Signs BP 155/65   Pulse 109   Resp 26   SpO2 100%   Physical Exam  Constitutional: She appears well-developed and well-nourished. No distress.  HENT:  Head: Normocephalic.  Eyes: Conjunctivae are normal.  Neck: Neck supple.  Cardiovascular: Normal rate, regular rhythm and normal heart sounds.   Pulmonary/Chest: Effort normal. No respiratory distress. She has no wheezes. She has no rales.  Decreased air movement bilaterally  Abdominal: Soft. Bowel sounds are normal. She exhibits no distension. There is no tenderness. There is no rebound.  Musculoskeletal: She exhibits edema.  2+ pitting edema of bilateral ankle and feet  Neurological: She is alert.  Skin: Skin is warm and dry.  Psychiatric: She has a normal mood and affect. Her behavior is normal.  Nursing note and vitals reviewed.    ED Treatments / Results  Labs (all labs ordered are listed, but only abnormal results are displayed) Labs Reviewed  BRAIN NATRIURETIC PEPTIDE - Abnormal; Notable for the following:       Result Value   B Natriuretic Peptide 410.2 (*)    All other components within normal limits  BASIC METABOLIC PANEL - Abnormal; Notable for the following:    Chloride 99 (*)    CO2 37 (*)    Glucose, Bld 132 (*)    All other components within normal  limits  CBC WITH DIFFERENTIAL/PLATELET - Abnormal; Notable for the following:    HCT 47.9 (*)    MCHC 28.8 (*)    RDW 18.6 (*)    Platelets 140 (*)    All  other components within normal limits  DIGOXIN LEVEL - Abnormal; Notable for the following:    Digoxin Level 0.6 (*)    All other components within normal limits  I-STAT TROPOININ, ED    EKG ED ECG REPORT   Date: 01/17/2016  Rate: 139  Rhythm: atrial fibrillation  QRS Axis: normal  Intervals: normal  ST/T Wave abnormalities: nonspecific ST/T changes  Conduction Disutrbances:none  Narrative Interpretation:   Old EKG Reviewed: unchanged  I have personally reviewed the EKG tracing and agree with the computerized printout as noted.  Radiology Dg Chest 2 View  Result Date: 01/17/2016 CLINICAL DATA:  Dyspnea, onset today EXAM: CHEST  2 VIEW COMPARISON:  12/10/2015 FINDINGS: There are pleural effusions bilaterally. There is mild vascular and interstitial fullness. No focal airspace consolidation. Unchanged cardiomegaly. IMPRESSION: Findings may represent mild congestive heart failure. Bilateral pleural effusions. Electronically Signed   By: Andreas Newport M.D.   On: 01/17/2016 06:25    Procedures Procedures (including critical care time)  Medications Ordered in ED Medications  furosemide (LASIX) injection 40 mg (not administered)     Initial Impression / Assessment and Plan / ED Course  I have reviewed the triage vital signs and the nursing notes.  Pertinent labs & imaging results that were available during my care of the patient were reviewed by me and considered in my medical decision making (see chart for details).  Clinical Course   Pt with worsening sob, LE swelling. Pt in afib RVR, pt in NAD. Will give cardizem bolus and reassess. Pt's HR is between 100-150. Decreased air movement bilaterally. Will give a neb treatment and lasix bolus. CXR and labs pending.   HR improved with bolus, in low 100s, will place on  drip for further control. Pt received solumedrol, breathing treatment, lasix. Had urine output. Digoxin level low, question medication non compliance. Will continue to montor. Will call for admission.   9:01 AM Spoke with Dr. Janne Napoleon, with triad, will admit. Asked cardiology consult. Spoke with Dr. Marlou Porch, advised to manage medically and call him if still needed.   Vitals:   01/17/16 0544 01/17/16 0545 01/17/16 0736 01/17/16 0831  BP: 155/65 155/65 144/85 128/60  Pulse: 119 109 110 120  Resp: 20 26 20 18   SpO2: 100% 100% 100% 100%    Final Clinical Impressions(s) / ED Diagnoses   Final diagnoses:  Atrial fibrillation with RVR (HCC)  Chronic obstructive pulmonary disease, unspecified COPD type ([place])  Peripheral edema    New Prescriptions New Prescriptions   No medications on file     Jeannett Senior, PA-C 01/17/16 0908    Julianne Rice, MD 01/17/16 [phone]

## 2016-01-17 NOTE — ED Notes (Signed)
Gave pt peanut butter and crackers per Apolonio Schneiders, RN

## 2016-01-17 NOTE — H&P (Addendum)
[facility] History and Physical  MARGY [name] [id number] DOB: 01/05/1941 DOA: 01/17/2016  Referring physician: ED PCP: Leonides Sake, MD   Chief Complaint: sob x 2 days  HPI: [name] [name] is a 75 y.o. female  with significant past medical history of chronic respiratory failure requiring 3 L of nasal cannula at baseline COPD, proximal atrial fibrillation, chronic diastolic heart failure, history of right-sided pneumothorax 3 requiring tube thoracostomy in the past, her artery disease, hypertension, hyperlipidemia, depression and GERD presents with worsening shortness of breath the last 2 days.  Of note, she was recently hospitalized September 27 and discharge 12/16/2015 to skilled nursing facility, for which she was there until about 5 days ago when she was discharged to home. She lives at home alone. Her son lives in at a different town, but he stayed with her overnight and called EMS this morning because she wasn't improving.  Patient denies any fevers or chills, denies chest pain.  Does note that she takes her Lasix not as prescribed because it causes her to urinate too often.  She admits her her legs are more swollen than normal today.  She is hungry and asking for food.  Per ER notes, she went to her PCP yesterday, and they increased her Lasix.  Patient reports compliance with other medications. Of note, she uses a walker at home but is having trouble ambulating secondary to worsening shortness of breath, with increased fatigue and weakness.   Review of Systems:  Per history of present illness, otherwise all systems reviewed, essentially unremarkable except for above  Past Medical History:  Diagnosis Date  . Anemia   . Arthritis    "back" (11/21/2014)  . Asthma   . Blood dyscrasia    hx blood clotts  . Chronic bronchitis ([place])   . Chronic kidney disease   . Chronic lower back pain   . 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 GERD  (gastroesophageal reflux disease)   . [id number])    "a few times/year" (11/21/2014)  . History of blood transfusion X 2   "related to blood thinner I was taking"  . Hyperlipidemia   . Hypertension   . Migraine    "weekly probably" (11/21/2014)  . On home oxygen therapy    "3L; 24/7" (11/21/2014)  . Pneumonia    hx pneumonia   . Pneumothorax on right 09/2014  . Pulmonary embolism ([place])   . Shortness of breath   . Urinary incontinence    Past Surgical History:  Procedure Laterality Date  . ABDOMINAL HYSTERECTOMY    . CATARACT EXTRACTION W/ INTRAOCULAR LENS  IMPLANT, BILATERAL Bilateral   . CYSTOSCOPY W/ RETROGRADES Bilateral 06/11/2014   Procedure: CYSTOSCOPY WITH RETROGRADE PYELOGRAM;  Surgeon: [name], MD;  Location: [facility];  Service: Urology;  Laterality: Bilateral;  . FRACTURE SURGERY    . HARDWARE REMOVAL  01/29/2012   Procedure: HARDWARE REMOVAL;  Surgeon: [name], MD;  Location: [facility];  Service: Orthopedics;  Laterality: Right;  . HEMIARTHROPLASTY SHOULDER FRACTURE Right   . HIP ARTHROPLASTY  01/29/2012   Procedure: ARTHROPLASTY BIPOLAR HIP;  Surgeon: [name], MD;  Location: Home;  Service: Orthopedics;  Laterality: Right;  . LAPAROSCOPIC CHOLECYSTECTOMY    . ORIF WRIST FRACTURE  10/13/2011   Procedure: OPEN REDUCTION INTERNAL FIXATION (ORIF) WRIST FRACTURE;  Surgeon: [name], MD;  Location: [place];  Service: Orthopedics;  Laterality: Right;  Open Reduction Internal Fixation  Right Distal Radius   . TRANSURETHRAL RESECTION OF BLADDER TUMOR N/A 06/11/2014   Procedure: TRANSURETHRAL RESECTION OF BLADDER TUMOR (TURBT);  Surgeon: [name], MD;  Location: [facility];  Service: Urology;  Laterality: N/A;  . TUBAL LIGATION     Social History:  reports that she quit smoking about 23 months ago. Her smoking use included Cigarettes. She has a 56.00 pack-year smoking history. She has never used smokeless tobacco. She reports that she does not drink alcohol or use  drugs.  Allergies  Allergen Reactions  . Eggs Or Egg-Derived Products Other (See Comments)    Dislikes eggs, every day. States no allergy    Family History  Problem Relation Age of Onset  . Cancer - Lung Mother   . Coronary artery disease Mother   . Coronary artery disease Sister   . Coronary artery disease Brother     Prior to Admission medications   Medication Sig Start Date End Date Taking? Authorizing Provider  albuterol (PROVENTIL HFA;VENTOLIN HFA) 108 (90 Base) MCG/ACT inhaler Inhale 2 puffs into the lungs 4 (four) times daily as needed for wheezing or shortness of breath.   Yes Historical Provider, MD  albuterol (PROVENTIL) (2.5 MG/3ML) 0.083% nebulizer solution Take 2.5 mg by nebulization every 6 (six) hours as needed for wheezing or shortness of breath.    Yes Historical Provider, MD  alendronate (FOSAMAX) 70 MG tablet Take 70 mg by mouth once a week. Take with a full glass of water on an empty stomach.   Yes Historical Provider, MD  aspirin-sod bicarb-citric acid (ALKA-SELTZER) 325 MG TBEF tablet Take 325 mg by mouth every 6 (six) hours as needed (for stomach).   Yes Historical Provider, MD  atorvastatin (LIPITOR) 40 MG tablet Take 40 mg by mouth every morning.  07/02/13  Yes Historical Provider, MD  calcitonin, salmon, (MIACALCIN/FORTICAL) 200 UNIT/ACT nasal spray Place 1 spray into alternate nostrils daily. 12/08/15  Yes Historical Provider, MD  Cranberry-Vitamin C-Probiotic (AZO CRANBERRY PO) Take 2 capsules by mouth daily.   Yes Historical Provider, MD  cyanocobalamin 500 MCG tablet Take 500 mcg by mouth daily.   Yes Historical Provider, MD  dabigatran (PRADAXA) 150 MG CAPS capsule Take 150 mg by mouth 2 (two) times daily.   Yes Historical Provider, MD  digoxin (LANOXIN) 0.125 MG tablet Take 1 tablet (0.125 mg total) by mouth daily. 12/17/15  Yes Verlee Monte, MD  diltiazem (CARDIZEM CD) 360 MG 24 hr capsule Take 1 capsule (360 mg total) by mouth every morning. 12/18/15  Yes  Mahima Bubba Camp, MD  docusate sodium (COLACE) 100 MG capsule Take 1 capsule (100 mg total) by mouth every 12 (twelve) hours. 10/21/15  Yes Tanna Furry, MD  ferrous sulfate 325 (65 FE) MG tablet Take 325 mg by mouth daily with breakfast.   Yes Historical Provider, MD  fluticasone (FLONASE) 50 MCG/ACT nasal spray Place 1 spray into both nostrils daily as needed for allergies or rhinitis. 08/06/13  Yes Barton Dubois, MD  furosemide (LASIX) 20 MG tablet Take 1 tablet (20 mg total) by mouth daily. 06/13/14  Yes Theodis Blaze, MD  guaiFENesin (MUCINEX) 600 MG 12 hr tablet Take 2 tablets (1,200 mg total) by mouth 2 (two) times daily. 12/16/15  Yes Verlee Monte, MD  HYDROcodone-acetaminophen (NORCO/VICODIN) 5-325 MG tablet Take 1 tablet by mouth every 4 (four) hours as needed. 01/02/16  Yes Tiffany L Reed, DO  metoprolol (LOPRESSOR) 50 MG tablet Take 1 tablet (50 mg total) by mouth 2 (two) times  daily. 12/16/15  Yes Verlee Monte, MD  Omega-3 Fatty Acids (FISH OIL) 1000 MG CAPS Take 1,000 mg by mouth daily.    Yes Historical Provider, MD  oxybutynin (DITROPAN-XL) 5 MG 24 hr tablet Take 10 mg by mouth at bedtime.   Yes Historical Provider, MD  pantoprazole (PROTONIX) 40 MG tablet Take 1 tablet (40 mg total) by mouth daily. [id number]  Yes Delora Fuel, MD  polyethylene glycol [facility] / GLYCOLAX) packet Take 17 g by mouth daily. 12/17/15  Yes Verlee Monte, MD  sertraline (ZOLOFT) 25 MG tablet Take 1 tablet (25 mg total) by mouth at bedtime. 09/24/15  Yes Juliet Rude, MD  tiotropium (SPIRIVA) 18 MCG inhalation capsule Place 1 capsule (18 mcg total) into inhaler and inhale daily. 08/06/13  Yes Barton Dubois, MD  tiZANidine (ZANAFLEX) 2 MG tablet Take 2 mg by mouth at bedtime.   Yes Historical Provider, MD   Physical Exam: Vitals:   01/17/16 0544 01/17/16 0545 01/17/16 0736 01/17/16 0831  BP: 155/65 155/65 144/85 128/60  Pulse: 119 109 110 120  Resp: 20 26 20 18   SpO2: 100% 100% 100% 100%    Wt Readings from Last 3  Encounters:  01/12/16 57.6 kg (127 lb)  12/23/15 57.6 kg (127 lb)  12/18/15 57.2 kg (126 lb)    General:  Appears calm and comfortable, pleasant, NAD, AAOx3, cachectic, appears older than STATED age Eyes: PERRL, normal lids, irises & conjunctiva, missing dentition. ENT:  She is hard of hearing, lips & tongue, mmm Neck: no LAD, masses or thyromegaly Cardiovascular: Irregularly irregular, tachycardic.   Telemetry: A. fib RVR rates ranging 110-130s during my examination Respiratory: diminished breath sounds in all lung fields., no w/r/r. Normal respiratory effort. Abdomen: soft, ntnd Skin: no rash or induration seen on limited exam;  Le: 2+ pitting edema bilateral lower extremities, no tenderness to palpation bilateral lower extremities.  Negative Homans sign bilaterally. Musculoskeletal: grossly normal tone BUE/BLE Psychiatric: grossly normal mood and affect, speech fluent and appropriate Neurologic: grossly non-focal.          Labs on Admission:  Basic Metabolic Panel:  Recent Labs Lab 01/17/16 0555  NA 145  K 4.1  CL 99*  CO2 37*  GLUCOSE 132*  BUN 11  CREATININE 0.52  CALCIUM 9.6   Liver Function Tests: No results for input(s): AST, ALT, ALKPHOS, BILITOT, PROT, ALBUMIN in the last 168 hours. No results for input(s): LIPASE, AMYLASE in the last 168 hours. No results for input(s): AMMONIA in the last 168 hours. CBC:  Recent Labs Lab 01/17/16 0555  WBC 8.7  NEUTROABS 6.9  HGB 13.8  HCT 47.9*  MCV 93.7  PLT 140*   Cardiac Enzymes: No results for input(s): CKTOTAL, CKMB, CKMBINDEX, TROPONINI in the last 168 hours.  BNP (last 3 results)  Recent Labs  08/30/15 0825 12/10/15 1526 01/17/16 0555  BNP 32.7 735.3* 410.2*    ProBNP (last 3 results) No results for input(s): PROBNP in the last 8760 hours.  CBG: No results for input(s): GLUCAP in the last 168 hours.  Radiological Exams on Admission: Dg Chest 2 View  Result Date: 01/17/2016 CLINICAL DATA:   Dyspnea, onset today EXAM: CHEST  2 VIEW COMPARISON:  12/10/2015 FINDINGS: There are pleural effusions bilaterally. There is mild vascular and interstitial fullness. No focal airspace consolidation. Unchanged cardiomegaly. IMPRESSION: Findings may represent mild congestive heart failure. Bilateral pleural effusions. Electronically Signed   By: Andreas Newport M.D.   On: 01/17/2016 06:25    EKG:  afib rvr on tele.  EKG Interpretation  Date/Time:    Ventricular Rate:    PR Interval:    QRS Duration:   QT Interval:    QTC Calculation:   R Axis:     Text Interpretation:         Echo 12/12/15 Study Conclusions  - Left ventricle: The cavity size was normal. Wall thickness was   normal. Systolic function was normal. The estimated ejection   fraction was in the range of 55% to 60%. The study is not   technically sufficient to allow evaluation of LV diastolic   function. - Mitral valve: Calcified annulus. Mildly thickened leaflets .   There was mild regurgitation. - Left atrium: The atrium was mildly dilated. - Right atrium: The atrium was moderately dilated. - Atrial septum: No defect or patent foramen ovale was identified. - Tricuspid valve: There was moderate regurgitation. - Impressions: Patient in very rapid afib during study consider f/u   CW doppler of AV after rate control as   morphology of AV suggests there should be some degree of AS  Impressions:  - Patient in very rapid afib during study consider f/u CW doppler   of AV after rate control as   morphology of AV suggests there should be some degree of AS   Assessment/Plan Principal Problem:   Acute on chronic respiratory failure with hypoxia and hypercapnia (HCC) Active Problems:   Atrial fibrillation with RVR (HCC)   Acute exacerbation of chronic obstructive pulmonary disease (COPD) (HCC)   CAD (coronary artery disease)   Essential hypertension   Acute diastolic heart failure (HCC)   HLD (hyperlipidemia)    GERD (gastroesophageal reflux disease)   1. Acute on chronic hypoxic and hypercapnic respiratory failure - Multifactorial. Suspect COPD exacerbation as well as acute diastolic congestive heart failure and atrial fibrillation with RVR - maintain O2 sats between 88-92% - Admit to stepdown unit secondary to diltiazem drip - on home 3l [place].  2. Edge of evaluation with RVR - Patient's digoxin level was suboptimal, renewed -  Remains ondiltiazem drip, converted to by mouth regimen once rate is controlled - Hold beta blocker in setting acute congestive heart failure and acute COPD exacerbation.  - resume in am order placed - CHADsVASC score 3, continue Pradaxa   3. COPD exacerbation - Acute signs of infection. We'll hold on antibiotic for now. - Hold beta blocker til am  -  continued Xopenex and Atrovent every 6 hours - When necessary Xopenex nebs. - continue IV steroids - continue Spiriva  4. Acute on chroinc diastolic chf, - [id number] echo The estimated ejection   fraction was in the range of 55% to 60% - bnp 410 on admission - patient received lasix 40iv x1 in ed; continue lasix 20iv bid for now - strict I/os, daily weights - started low dose lisinopril 2.5 qd. - resume bb in am.  5. bilat pleural effusions - f/w w/ diuresis.,  - right-sided pneumothorax 3 requiring tube thoracostomy in the past  6. hld - continue lipitor   7. gerd - continued ppi  8. Malnutrition/cachexia - dietary cs  9. Depression - continued zoloft  C/S ER called DR [facility] cardiology, who stated continue management, if unable to switch off cardizem gtt/decompensates, can call him back.  Code Status: Full DVT Prophylaxis: on pradaxa Family Communication: no family at bedside Disposition Plan: admit to stepdown  Time spent: 1mins  Maren Reamer MD., MBA/MHA [facility] Pager [phone]

## 2016-01-17 NOTE — ED Notes (Signed)
While in room assisting patient and administering meds, pt heartrate exhibited mulitple episodes of tachy A-fib in 140-150's hr range.  Pt heart rate continually dropped back to lower 100-110's in between episodes of elevation.  Pt changed into dry brief after urination.

## 2016-01-17 NOTE — Progress Notes (Signed)
Pt's son, Ardine Bjork, states that pt's Home Meds were given to EMT for transport to [facility] ED. Cyril Mourning, Warehouse manager notified and ED Camera operator called. Per ED Charge Nurse, Pt's nurse, Benjamine Mola, RN, was called at home to verify if she had noted Norco 5/325 mg tablets in pt's home meds upon arrival. ED nurse reports having placed meds and other belongings in a pt belonging bag but did not verify contents of meds etc. Pharmacy notified as well of home med Norco missing per pt's son. Upon arrival to [phone] for admission, pt's belonging bag was noted placed in chair in room [id number] by ED Nurse transporting. No meds were mentioned to be in pt belonging bag by pt upon arrival. Pt's son and daughter in law state that her home meds included 2 - 30 count blister packs of Norco and 1 partial blister pack which only had nine removed as of yesterday. Pack now contains 8 Norco tabs and 1 of the full blister packs is now missing per Son and daughter in law, which makes a total of 43 tabs missing. All home meds sent home with Son including the remaining tabs of Norco which is a total of 38 tabs in remaining blister packs.    Pt's son instructed to verify that none of the tabs were misplaced and found at home when he goes back home tonight, and let us know if meds are truly missing or simply mis placed.

## 2016-01-17 NOTE — ED Notes (Signed)
Patient transported to X-ray 

## 2016-01-17 NOTE — ED Notes (Signed)
Meal delivered. Pharmacy called to send at home meds

## 2016-01-17 NOTE — Progress Notes (Signed)
Initial Nutrition Assessment  DOCUMENTATION CODES:  Non-severe (moderate) malnutrition in context of chronic illness  Pt meets criteria for MODERATE MALNUTRITION in the context of Chronic Illness as evidenced by Moderate Muscle/fat depletion.  INTERVENTION:  Per diet recall, pt's diet is extremely high in sodium. She also sounds to be consuming > 3 L of fluid per day  Gave some basic education on how to lower sodium/fluid in her diet  Ensure Enlive po q24 hr, each supplement provides 350 kcal and 20 grams of protein  NUTRITION DIAGNOSIS:  Food and nutrition related knowledge deficit related to limited prior education / limited comprehension as evidenced by Diet recall being extremely high in both fluid and sodium.  GOAL:  Patient will meet greater than or equal to 90% of their needs  MONITOR:  PO intake, Supplement acceptance, Labs, Weight trends  REASON FOR ASSESSMENT:  Consult Assessment of nutrition requirement/status  ASSESSMENT:  75 y/o Female PMHx Chronic Resp failure on 3 L o2 at baseline, COPD, A fib, CHF, CAD, HTN, HLD, GERD, Depression. Presents with increased SOB x2 days. Admitted for acute on chronic respiratory failure that is multifactorial.   Pt reports that despite her increased dyspnea, she has had a good appetite. At baseline, she says she doesn't use salt and hasn't for the past 5 years.   RD went through her dietary recall to assess if her diet is contributing to her symptoms. She says she drinks 5 water bottles a day. When asked if these were the half pints, she said "no" and indicated with her hands the approximate size. Suspected to be 16.9 oz bottles. 5 of these equates to >2.5 Liters for day. In addition, she drinks a glass of orange juice and coffee each day. If these are 8 oz servings she would be consuming >3 L fluid/day.   She said she eats eggs and bacon for breakfast, though this sounded to be what she had in the hospital. She could not mention the  most common food or meals she eats at home. She reported "yes" to consuming the following items: Condensed soups, frozen meals, hotdogs, deli meat sandwiches.   RD talked about how many of the foods she is eating are inherently high in sodium. Even if she is not using the salt shaker, she is still consuming a very high sodium diet. She said she already knew that soup and frozen meals are high in salt, but ddnt state why she was still consuming them. RD asked her to try to cut back on these as this may help her swelling. Additionally, recommended she cut back on how much fluid she drinks. She says she has never been on a fluid restriction.   She was living on her own, but has plans to move in with her son. RD voiced that he thought this was a good idea as pt sounds to rely on convenience foods which are naturally high in salt. Family could help her prepare lower sodium meals.  She says her UBW is 127 lbs, but she is up to 135 lbs now.   NFPE: Moderate muscle wasting, mod-severe fat wasting. Mod edema.   Labs: Glu: 130-170, Hypercapnic Meds: Calcitonin, b12, colace, lasix, miralax, ppi, omega 3s, methylprednisolone, iron    Recent Labs Lab 01/17/16 0555 01/17/16 0922  NA 145  --   K 4.1  --   CL 99*  --   CO2 37*  --   BUN 11  --   CREATININE 0.52  --  CALCIUM 9.6  --   MG  --  1.8  GLUCOSE 132*  --    Diet Order:  Diet Heart Room service appropriate? Yes; Fluid consistency: Thin Diet Heart Room service appropriate? Yes; Fluid consistency: Thin  Skin:  Reviewed, no issues  Last BM:  Unknown  Height:  Ht Readings from Last 1 Encounters:  01/12/16 5\' 1"  (1.549 m)   Weight:  Wt Readings from Last 1 Encounters:  01/17/16 135 lb (61.2 kg)   Wt Readings from Last 10 Encounters:  01/17/16 135 lb (61.2 kg)  01/12/16 127 lb (57.6 kg)  12/23/15 127 lb (57.6 kg)  12/18/15 126 lb (57.2 kg)  12/16/15 126 lb 4.8 oz (57.3 kg)  11/24/15 127 lb (57.6 kg)  09/23/15 139 lb 8 oz (63.3  kg)  08/30/15 134 lb (60.8 kg)  12/18/14 112 lb (50.8 kg)  12/11/14 112 lb (50.8 kg)   Ideal Body Weight:  47.73 kg  BMI:  Body mass index is 25.51 kg/m.  Estimated Nutritional Needs:  Kcal:  1600-1800 (28-31 kcal/kg bw) Protein:  75-85 g Pro (1.3-1.5 g/kg BW) Fluid:  Per MD  EDUCATION NEEDS:  Education needs no appropriate at this time  Burtis Junes RD, LDN, [facility] Pager: [phone] 01/17/2016 3:46 PM

## 2016-01-18 DIAGNOSIS — E440 Moderate protein-calorie malnutrition: Secondary | ICD-10-CM | POA: Insufficient documentation

## 2016-01-18 LAB — CBC
HEMATOCRIT: 40.8 % (ref 36.0–46.0)
HEMOGLOBIN: 11.7 g/dL — AB (ref 12.0–15.0)
MCH: 26.4 pg (ref 26.0–34.0)
MCHC: 28.7 g/dL — ABNORMAL LOW (ref 30.0–36.0)
MCV: 91.9 fL (ref 78.0–100.0)
Platelets: 107 10*3/uL — ABNORMAL LOW (ref 150–400)
RBC: 4.44 MIL/uL (ref 3.87–5.11)
RDW: 18.5 % — ABNORMAL HIGH (ref 11.5–15.5)
WBC: 12.5 10*3/uL — AB (ref 4.0–10.5)

## 2016-01-18 LAB — BASIC METABOLIC PANEL
Anion gap: 8 (ref 5–15)
BUN: 13 mg/dL (ref 6–20)
CHLORIDE: 93 mmol/L — AB (ref 101–111)
CO2: 41 mmol/L — AB (ref 22–32)
Calcium: 8.6 mg/dL — ABNORMAL LOW (ref 8.9–10.3)
Creatinine, Ser: 0.64 mg/dL (ref 0.44–1.00)
GFR calc non Af Amer: >60 mL/min (ref >60)
Glucose, Bld: 146 mg/dL — ABNORMAL HIGH (ref 65–99)
POTASSIUM: 4.4 mmol/L (ref 3.5–5.1)
SODIUM: 142 mmol/L (ref 135–145)

## 2016-01-18 MED ORDER — LEVALBUTEROL HCL 0.63 MG/3ML IN NEBU
0.6300 mg | INHALATION_SOLUTION | Freq: Four times a day (QID) | RESPIRATORY_TRACT | Status: DC
  Administered 2016-01-18 – 2016-01-19 (×3): 0.63 mg via RESPIRATORY_TRACT
  Filled 2016-01-18 (×3): qty 3

## 2016-01-18 MED ORDER — LEVALBUTEROL HCL 1.25 MG/0.5ML IN NEBU: 1.2500 mg | INHALATION_SOLUTION | Freq: Four times a day (QID) | RESPIRATORY_TRACT | Status: DC | PRN

## 2016-01-18 MED ORDER — METHYLPREDNISOLONE SODIUM SUCC 40 MG IJ SOLR
40.0000 mg | Freq: Two times a day (BID) | INTRAMUSCULAR | Status: DC
  Administered 2016-01-19 – 2016-01-21 (×4): 40 mg via INTRAVENOUS
  Filled 2016-01-18 (×4): qty 1

## 2016-01-18 MED ORDER — LEVALBUTEROL HCL 0.63 MG/3ML IN NEBU: 0.6300 mg | INHALATION_SOLUTION | Freq: Four times a day (QID) | RESPIRATORY_TRACT | Status: DC

## 2016-01-18 NOTE — Progress Notes (Signed)
PT Cancellation Note  Patient Details Name: [name] [name] MRN: [id number] DOB: 01-23-1941   Cancelled Treatment:    Reason Eval/Treat Not Completed: Patient declined, no reason specified;Fatigue/lethargy limiting ability to participate.  Will return later today as time allows.   Despina Pole 01/18/2016, 12:19 PM Carita Pian. Sanjuana Kava, [place] Pager [phone]

## 2016-01-18 NOTE — Plan of Care (Signed)
Problem: Activity: Goal: Ability to tolerate increased activity will improve Outcome: Progressing Improved HR control with Cardizem gtt at 5 mg/hr and Metoprolol PO.

## 2016-01-18 NOTE — Progress Notes (Signed)
PT Cancellation Note  Patient Details Name: [name] [name] MRN: [id number] DOB: Jul 04, 1940   Cancelled Treatment:    Reason Eval/Treat Not Completed: Patient declined, no reason specified.  Patient declined OOB x2 today.  "I can't do anything today"  Will return tomorrow for PT evaluation. Patient did state that she is moving in with her son at d/c.  Need to verify.   Despina Pole 01/18/2016, 3:20 PM Carita Pian Sanjuana Kava, [place] Pager [phone]

## 2016-01-18 NOTE — Plan of Care (Signed)
Problem: Safety: Goal: Ability to remain free from injury will improve Outcome: Progressing Patient instructed to call for assistance and was told that I would put her bed alarm on and why and she was okay with that. Patient has her belongings within reach and her call light in her bed with her, will continue to monitor.

## 2016-01-18 NOTE — Discharge Instructions (Addendum)

## 2016-01-18 NOTE — Progress Notes (Signed)
Report received in patient's room via Marya Amsler RN using SBAR format, reviewed VS, meds, labs, tests and patient's general condition, assumed care of patient.

## 2016-01-18 NOTE — Progress Notes (Addendum)
Patient ID: [name] [name], female   DOB: 1940/11/13, 75 y.o.   MRN: [id number]    PROGRESS NOTE    [name] [name]  [id number] DOB: 14-Dec-1940 DOA: 01/17/2016  PCP: Leonides Sake, MD   Brief Narrative:  75 y.o. female with known chronic respiratory failure requiring 3 L of nasal cannula at baseline COPD, proximal atrial fibrillation, chronic diastolic heart failure, history of right-sided pneumothorax 3 requiring tube thoracostomy in the past, her artery disease, hypertension, hyperlipidemia, depression and GERD presented with progressively worsening shortness of breath two days in duration. Of note, she was recently hospitalized September 27 and discharged on 12/16/2015 to skilled nursing facility, was there until about 5 days prior to this admission and was then discharged to home. She lives at home alone.   Assessment & Plan:   Acute on chronic hypoxic and hypercapnic respiratory failure, on home O2 3 L [name] - Multifactorial. Suspect COPD exacerbation as well as acute diastolic congestive heart failure and atrial fibrillation with RVR - Patient reports feeling better but still with significant exertional dyspnea - For acute COPD patient is currently on Solu-Medrol 60 mg every 6 hours, we'll continue Solu-Medrol but will taper down to twice a day regimen - Continue bronchodilators scheduled and as needed - Continuing Lasix as well, please see detailed management below - maintain O2 sats between 88-92% - Patient still in atrial fibrillation, keep on Cardizem drip for now, continue Pradaxa  Atrial fibrillation with RVR - CHADsVASC score 3, continue Pradaxa - Keep on telemetry - Continue digoxin, continue metoprolol per home medical regimen but may need to hold if SBP < 90   Acute exacerbation of COPD - As noted above, continue Solu-Medrol with tapering as outlined above - Continue bronchodilators scheduled and as needed  Acute diastolic CHF - 123456 echo The estimated ejection  fraction was in the range of 55% to 60% - Continue with Lasix 20 mg twice a day - Weight trend since admission: 135 --> 135 lbs this am - Continue to monitor daily weights - strict I/os, daily weights - started low dose lisinopril 2.5 qd in ED but had to hold today due to SBP in 80s - Please note that cardiology has been called by emergency room physician, Dr. Marlou Porch recommended to continue management but Lasix, Cardizem drip and if patient decompensates place official consult cardiology  Thrombocytopenia - Mild, possibly reactive - No signs of active bleeding - CBC in the morning  Hyperlipidemia, mixed - continue lipitor  Depression - continued zoloft  DVT prophylaxis: Pradaxa Code Status: Full Family Communication: Patient at bedside  Disposition Plan: Home in 2-3 days  Consultants:   None   Procedures:   None  Antimicrobials:   None   Subjective: No events overnight. Patient still reports exertional dyspnea and cough  Objective: Vitals:   01/18/16 1533 01/18/16 1535 01/18/16 1603 01/18/16 1734  BP:  107/74 108/68 (!) 115/58  Pulse:  69 70 (!) 112  Resp:  17 18 19   Temp:      TempSrc:      SpO2: 100% 97% 97% 100%  Weight:        Intake/Output Summary (Last 24 hours) at 01/18/16 1823 Last data filed at 01/18/16 1500  Gross per 24 hour  Intake              670 ml  Output                0 ml  Net  670 ml   Filed Weights   01/17/16 1433 01/18/16 0605  Weight: 61.2 kg (135 lb) 61 kg (134 lb 8 oz)    Examination:  General exam: Appears calm and comfortable  Respiratory system: Notable exertional dyspnea with minimal movement, coarse breath sounds bilaterally with expiratory wheezing, crackles at baseline Cardiovascular system: IRRR. No rubs, gallops or clicks. Gastrointestinal system: Abdomen is nondistended, soft and nontender. No organomegaly or masses felt. Normal bowel sounds heard. Central nervous system: Alert and oriented. No focal  neurological deficits.  Data Reviewed: I have personally reviewed following labs and imaging studies  CBC:  Recent Labs Lab 01/17/16 0555 01/18/16 0328  WBC 8.7 12.5*  NEUTROABS 6.9  --   HGB 13.8 11.7*  HCT 47.9* 40.8  MCV 93.7 91.9  PLT 140* [id number]*   Basic Metabolic Panel:  Recent Labs Lab 01/17/16 0555 01/17/16 0922 01/18/16 0328  NA 145  --  142  K 4.1  --  4.4  CL 99*  --  93*  CO2 37*  --  41*  GLUCOSE 132*  --  146*  BUN 11  --  13  CREATININE 0.52  --  0.64  CALCIUM 9.6  --  8.6*  MG  --  1.8  --     Recent Labs  01/17/16 0922  TSH 1.249  FREET4 1.27*   Radiology Studies: Dg Chest 2 View  Result Date: 01/17/2016 CLINICAL DATA:  Dyspnea, onset today EXAM: CHEST  2 VIEW COMPARISON:  12/10/2015 FINDINGS: There are pleural effusions bilaterally. There is mild vascular and interstitial fullness. No focal airspace consolidation. Unchanged cardiomegaly. IMPRESSION: Findings may represent mild congestive heart failure. Bilateral pleural effusions. Electronically Signed   By: Andreas Newport M.D.   On: 01/17/2016 06:25    Scheduled Meds: . atorvastatin  40 mg Oral q morning - 10a  . calcitonin (salmon)  1 spray Alternating Nares Daily  . cyanocobalamin  500 mcg Oral Daily  . dabigatran  150 mg Oral BID  . digoxin  0.125 mg Oral Daily  . docusate sodium  100 mg Oral Q12H  . feeding supplement (ENSURE ENLIVE)  237 mL Oral BID BM  . ferrous sulfate  325 mg Oral Q breakfast  . furosemide  20 mg Intravenous BID  . guaiFENesin  1,200 mg Oral BID  . levalbuterol  0.63 mg Nebulization QID  . methylPREDNISolone (SOLU-MEDROL) injection  60 mg Intravenous Q6H  . metoprolol  50 mg Oral BID  . omega-3 acid ethyl esters  1 g Oral Daily  . oxybutynin  10 mg Oral QHS  . pantoprazole  40 mg Oral Daily  . polyethylene glycol  17 g Oral Daily  . sertraline  25 mg Oral QHS  . tiotropium  18 mcg Inhalation Daily  . tiZANidine  2 mg Oral QHS   Continuous Infusions: .  diltiazem (CARDIZEM) infusion 5 mg/hr (01/18/16 1749)     LOS: 1 day    Time spent: 20 minutes   [name], MD [facility] Pager [phone]  If 7PM-7AM, please contact night-coverage [email or web address] Password [facility] 01/18/2016, 6:23 PM

## 2016-01-19 ENCOUNTER — Other Ambulatory Visit: Payer: Self-pay | Admitting: *Deleted

## 2016-01-19 DIAGNOSIS — J441 Chronic obstructive pulmonary disease with (acute) exacerbation: Principal | ICD-10-CM

## 2016-01-19 DIAGNOSIS — I10 Essential (primary) hypertension: Secondary | ICD-10-CM

## 2016-01-19 DIAGNOSIS — E782 Mixed hyperlipidemia: Secondary | ICD-10-CM

## 2016-01-19 LAB — BASIC METABOLIC PANEL
Anion gap: 8 (ref 5–15)
BUN: 27 mg/dL — ABNORMAL HIGH (ref 6–20)
CALCIUM: 8.6 mg/dL — AB (ref 8.9–10.3)
CHLORIDE: 89 mmol/L — AB (ref 101–111)
CO2: 41 mmol/L — AB (ref 22–32)
CREATININE: 0.75 mg/dL (ref 0.44–1.00)
GFR calc non Af Amer: >60 mL/min (ref >60)
GLUCOSE: 138 mg/dL — AB (ref 65–99)
Potassium: 4.7 mmol/L (ref 3.5–5.1)
Sodium: 138 mmol/L (ref 135–145)

## 2016-01-19 LAB — CBC
HCT: 42.4 % (ref 36.0–46.0)
Hemoglobin: 12.1 g/dL (ref 12.0–15.0)
MCH: 26.5 pg (ref 26.0–34.0)
MCHC: 28.5 g/dL — ABNORMAL LOW (ref 30.0–36.0)
MCV: 92.8 fL (ref 78.0–100.0)
Platelets: 128 K/uL — ABNORMAL LOW (ref 150–400)
RBC: 4.57 MIL/uL (ref 3.87–5.11)
RDW: 18.8 % — ABNORMAL HIGH (ref 11.5–15.5)
WBC: 18.9 K/uL — ABNORMAL HIGH (ref 4.0–10.5)

## 2016-01-19 MED ORDER — LEVALBUTEROL HCL 1.25 MG/0.5ML IN NEBU: 0.6300 mg | INHALATION_SOLUTION | Freq: Four times a day (QID) | RESPIRATORY_TRACT | Status: DC | PRN

## 2016-01-19 MED ORDER — LEVALBUTEROL HCL 0.63 MG/3ML IN NEBU
0.6300 mg | INHALATION_SOLUTION | Freq: Three times a day (TID) | RESPIRATORY_TRACT | Status: DC
  Administered 2016-01-19 – 2016-01-22 (×10): 0.63 mg via RESPIRATORY_TRACT
  Filled 2016-01-19 (×10): qty 3

## 2016-01-19 NOTE — Progress Notes (Addendum)
Patient ID: [name] [name], female   DOB: 1940-12-17, 74 y.o.   MRN: [id number]    PROGRESS NOTE    [name] [name]  [id number] DOB: Mar 26, 1940 DOA: 01/17/2016  PCP: Leonides Sake, MD   Brief Narrative:  75 y.o. female with known chronic respiratory failure requiring 3 L of nasal cannula at baseline, COPD, ?paroxysmal atrial fibrillation, chronic diastolic heart failure, history of right-sided pneumothorax 3 requiring tube thoracostomy in the past, coronary artery disease, hypertension, hyperlipidemia, depression, PE and GERD presented with progressively worsening shortness of breath two days in duration. Of note, she was recently hospitalized September 27 and discharged on 12/16/2015 to skilled nursing facility, was there until about 5 days prior to this admission and was then discharged to home. She lives at home alone. Frequent hospitalizations and ED visits.  Assessment & Plan:   Acute on chronic hypoxic and hypercapnic respiratory failure, on home O2 3 L [place] - Multifactorial. Suspect COPD exacerbation as well as acute on chronic diastolic congestive heart failure and atrial fibrillation with RVR - Treat underlying cause and monitor. - Clinically improving.  Atrial fibrillation with RVR - CHADsVASC score 3, continue Pradaxa - Keep on telemetry - Continue digoxin, continue metoprolol per home medical regimen but may need to hold if SBP < 90  - Controlled ventricular rate. Off of Cardizem infusion since midnight 01/18/16. RVR likely precipitated by acute respiratory issues.  Acute exacerbation of COPD - Treating with IV Solu-Medrol, bronchodilators scheduled and as needed. Titrate to maintain oxygen saturations between 89-92 percent. Clinically improving. Since patient is improving without initiation of antibiotics, will not start antibiotics at this time.  Acute on chronic diastolic CHF - 123456 echo The estimated ejection fraction was in the range of 55% to 60% - Treating with  furosemide 20 mg IV twice a day. Respiratory status is improved. Still has significant leg edema. Intake output charting is inaccurate and inadequate. Weight appears unchanged but may also be inaccurate reading. Request nursing to chart intake output and weights accurately. - Please note that cardiology has been called by emergency room physician, Dr. Marlou Porch recommended to continue management but Lasix, Cardizem drip and if patient decompensates place official consult cardiology - Follow-up chest x-ray 01/20/16.  Thrombocytopenia - Mild, possibly reactive - No signs of active bleeding - Stable. Follow CBC periodically.  Hyperlipidemia, mixed - continue lipitor  Essential hypertension  - controlled.  Depression - continued zoloft  Adult failure to thrive - Multifactorial from advanced age and multiple significant medical problems. - As per discussion with son, family is aware of her sustained decline and are hopeful that she may be able to go to SNF at discharge while they make arrangements for her to return home with hospice.  Abnormal TFTs - TSH 1.249/normal. Free T4: 1.27/minimally elevated. Clinically appears euthyroid. Recommend follow-up TFTs in 4-6 weeks.    DVT prophylaxis: Pradaxa Code Status: Patient was admitted as full code. Discussed at length with patient who has limited understanding of this. Discussed at length with patient's son who changed her status to DO NOT RESUSCITATE. He understands and is agreeable to this status. Family Communication: discussed with son in detail, updated care and answered questions.  Disposition Plan: SNF when medically stable, possibly in the next 2-3 days.   Consultants:   None   Procedures:   None  Antimicrobials:   None   Subjective: Patient seen this morning. Indicates that her dyspnea has improved. Denies cough, chest pain or palpitations. Complains of leg swelling  which has slightly improved. As per RN, diltiazem infusion  was discontinued midnight last night. No other acute complaints reported.   Objective: Vitals:   01/19/16 0855 01/19/16 1026 01/19/16 1033 01/19/16 1217  BP:  (!) 114/57 (!) 114/57   Pulse:  93 95   Resp:      Temp:    97.8 F (36.6 C)  TempSrc:    Axillary  SpO2: 93% 98%    Weight:      Respiratory rate 21/m   Intake/Output Summary (Last 24 hours) at 01/19/16 1416 Last data filed at 01/19/16 1132  Gross per 24 hour  Intake             1037 ml  Output               75 ml  Net              962 ml   Filed Weights   01/17/16 1433 01/18/16 0605 01/19/16 0437  Weight: 61.2 kg (135 lb) 61 kg (134 lb 8 oz) 61.6 kg (135 lb 11.2 oz)    Examination:  General exam: Appears calm and comfortable. Frail pleasant elderly female, chronically ill-looking lying comfortably propped up in bed without distress. Hard of hearing.  Respiratory system: diminished breath sounds in the bases with few basal crackles. Rest of lung fields mostly clear except occasional expiratory rhonchi. No increased work of breathing.  Cardiovascular system: S1 and S2 heard, irregularly irregular. No JVD or murmurs. 2+ pitting bilateral leg edema to knees.  Gastrointestinal system: Abdomen is nondistended, soft and nontender. No organomegaly or masses felt. Normal bowel sounds heard. Central nervous system: Alert and oriented. No focal neurological deficits.  Data Reviewed: I have personally reviewed following labs and imaging studies  CBC:  Recent Labs Lab 01/17/16 0555 01/18/16 0328 01/19/16 0459  WBC 8.7 12.5* 18.9*  NEUTROABS 6.9  --   --   HGB 13.8 11.7* 12.1  HCT 47.9* 40.8 42.4  MCV 93.7 91.9 92.8  PLT 140* 107* 0000000*   Basic Metabolic Panel:  Recent Labs Lab 01/17/16 0555 01/17/16 0922 01/18/16 0328 01/19/16 0459  NA 145  --  142 138  K 4.1  --  4.4 4.7  CL 99*  --  93* 89*  CO2 37*  --  41* 41*  GLUCOSE 132*  --  146* 138*  BUN 11  --  13 27*  CREATININE 0.52  --  0.64 0.75  CALCIUM 9.6   --  8.6* 8.6*  MG  --  1.8  --   --     Recent Labs  01/17/16 0922  TSH 1.249  FREET4 1.27*   Radiology Studies: No results found.  Scheduled Meds: . atorvastatin  40 mg Oral q morning - 10a  . calcitonin (salmon)  1 spray Alternating Nares Daily  . cyanocobalamin  500 mcg Oral Daily  . dabigatran  150 mg Oral BID  . digoxin  0.125 mg Oral Daily  . docusate sodium  100 mg Oral Q12H  . feeding supplement (ENSURE ENLIVE)  237 mL Oral BID BM  . ferrous sulfate  325 mg Oral Q breakfast  . furosemide  20 mg Intravenous BID  . guaiFENesin  1,200 mg Oral BID  . levalbuterol  0.63 mg Nebulization TID  . methylPREDNISolone (SOLU-MEDROL) injection  40 mg Intravenous Q12H  . metoprolol  50 mg Oral BID  . omega-3 acid ethyl esters  1 g Oral Daily  . oxybutynin  10  mg Oral QHS  . pantoprazole  40 mg Oral Daily  . polyethylene glycol  17 g Oral Daily  . sertraline  25 mg Oral QHS  . tiotropium  18 mcg Inhalation Daily  . tiZANidine  2 mg Oral QHS   Continuous Infusions: . diltiazem (CARDIZEM) infusion Stopped (01/19/16 0000)     LOS: 2 days      Shomari Scicchitano, MD, FACP, FHM. [facility] Pager [phone]  If 7PM-7AM, please contact night-coverage [email or web address] Password [email or web address] 01/19/2016, 2:30 PM

## 2016-01-19 NOTE — Consult Note (Signed)
   [facility] CM Inpatient Consult   01/19/2016  [name] [name] Nov 08, 1940 [id number]   Patient is currently active with [facility] for chronic disease management services. Patient has had 3 admission and 4 ED visits in the past 6 months.   Patient has been engaged by a [employer] and [name].  Patient recently discharged from [facility] for rehab.  HX of COPD exacerbations.  Chart review reveals per MD notes this is a  75 y.o.femalewith known chronic respiratory failure requiring 3 L of nasal cannula at baseline COPD, proximal atrial fibrillation, chronic diastolic heart failure, history of right-sided pneumothorax 3 requiring tube thoracostomy in the past, her artery disease, hypertension, hyperlipidemia, depression and GERD presented with progressively worsening shortness of breath. Chart review from PT and OT are recommending for skilled nursing facility.  Active consent on file.  [facility] was notified of admission.  Made Inpatient Case Manager aware that [facility] aware of hospitalizations.  Patient could benefit from a palliative consult.  Of note, [facility] Care Management services does not replace or interfere with any services that are needed or arranged by inpatient case management or social work.  For additional questions or referrals please contact:  Natividad Brood, RN BSN [facility] Liaison  [phone] business mobile phone Toll free office [phone]    .

## 2016-01-19 NOTE — Care Management Note (Signed)
Case Management Note  Patient Details  Name: [name] [name] MRN: [id number] Date of Birth: [birth year]  Subjective/Objective:    Pt presented for Acute on Chronic CHF.                 Action/Plan: CM did discuss plan of care with pt and son. Family in the process of getting CAPS set up for the patient. In the meantime- family shared that pt will be home alone. Pt is agreeable to [place] with [facility]. Post SNF- family is agreeable to [facility] and will DME. CM did make them aware that [facility] will assist with those needs. No further needs from the CM.   Expected Discharge Date:  01/20/16               Expected Discharge Plan:  Skilled Nursing Facility  In-House Referral:  Clinical Social Work  Discharge planning Services  CM Consult  Post Acute Care Choice:  NA Choice offered to:  NA  DME Arranged:  N/A DME Agency:  NA  HH Arranged:  NA HH Agency:  NA  Status of Service:  Completed, signed off  If discussed at [employer] of Stay Meetings, dates discussed:    Additional Comments:  Bethena Roys, RN 01/19/2016, 2:17 PM

## 2016-01-19 NOTE — Evaluation (Signed)
Occupational Therapy Evaluation Patient Details Name: [name] [name] MRN: [id number] DOB: February 02, 1941 Today's Date: 01/19/2016    History of Present Illness pt presents with acute on chronic respiratory failure.  pt with hx of COPD, A-fib, HF, pneumothorax x3, HTN, and Depression.     Clinical Impression   Pt with decline in funciton and safety with ADLs and ADL mobility with decreased strength, balance and endurance. Pt would benefit from acute OT services to address impairments to increase level of function and safety    Follow Up Recommendations  SNF    Equipment Recommendations  None recommended by OT    Recommendations for Other Services       Precautions / Restrictions Precautions Precautions: Fall Precaution Comments: pt on 3L O2 at home. Restrictions Weight Bearing Restrictions: No      Mobility Bed Mobility Overal bed mobility: Needs Assistance Bed Mobility: Supine to Sit;Sit to Supine     Supine to sit: Min guard Sit to supine: Min assist   General bed mobility comments: pt keeps HOB elevated and with heavy use of UEs is able to bring self to sitting, but needs A with guiding LEs back to bed.    Transfers Overall transfer level: Needs assistance Equipment used: 1 person hand held assist Transfers: Sit to/from Stand Sit to Stand: Min assist         General transfer comment: pt declined sitting up in recliner, encouraged pt to use BSC vs bedpan    Balance Overall balance assessment: Needs assistance;History of Falls Sitting-balance support: Single extremity supported;Bilateral upper extremity supported;Feet supported Sitting balance-Leahy Scale: Fair Sitting balance - Comments: pt seems to rely on UEs for energy conservation more than balance.     Standing balance support: Single extremity supported;During functional activity Standing balance-Leahy Scale: Poor                              ADL Overall ADL's : Needs  assistance/impaired     Grooming: Wash/dry hands;Wash/dry face;Sitting;Min guard   Upper Body Bathing: Min guard;Sitting   Lower Body Bathing: Maximal assistance   Upper Body Dressing : Min guard;Sitting   Lower Body Dressing: Total assistance   Toilet Transfer: Minimal assistance;BSC   Toileting- Clothing Manipulation and Hygiene: Maximal assistance       Functional mobility during ADLs: Minimal assistance       Vision  no change from baseline              Pertinent Vitals/Pain Pain Assessment: No/denies pain     Hand Dominance Left   Extremity/Trunk Assessment Upper Extremity Assessment Upper Extremity Assessment: Generalized weakness   Lower Extremity Assessment Lower Extremity Assessment: Defer to PT evaluation   Cervical / Trunk Assessment Cervical / Trunk Assessment: Kyphotic   Communication Communication Communication: HOH   Cognition Arousal/Alertness: Awake/alert Behavior During Therapy: WFL for tasks assessed/performed Overall Cognitive Status: Within Functional Limits for tasks assessed                     General Comments   pt very pleasant and cooperative                 Home Living Family/patient expects to be discharged to:: Skilled nursing facility Living Arrangements: Children;Alone                               Additional  Comments: pt lives alone, but thinks she will be moving in with her son and daughter-in-law.  SW talked to daughter-in-law who states pt is not moving in with them since they both have to work.        Prior Functioning/Environment Level of Independence: Needs assistance  Gait / Transfers Assistance Needed: uses rollator ADL's / Homemaking Assistance Needed: children do the cleaning, get meals on wheels and uses microwave meals; she does all of her medication   Comments: Pt does not drive; family assists with grocery shopping        OT Problem List: Decreased strength;Decreased  activity tolerance;Decreased knowledge of use of DME or AE;Impaired balance (sitting and/or standing);Increased edema   OT Treatment/Interventions: Self-care/ADL training;DME and/or AE instruction;Therapeutic activities;Patient/family education    OT Goals(Current goals can be found in the care plan section) Acute Rehab OT Goals Patient Stated Goal: go home OT Goal Formulation: With patient/family Time For Goal Achievement: 01/26/16 Potential to Achieve Goals: Good ADL Goals Pt Will Perform Grooming: with supervision;with set-up;sitting Pt Will Perform Upper Body Bathing: with supervision;with set-up;sitting Pt Will Perform Lower Body Bathing: with mod assist;sitting/lateral leans;sit to/from stand Pt Will Perform Upper Body Dressing: with set-up;with supervision;sitting Pt Will Transfer to Toilet: with min guard assist;bedside commode Pt Will Perform Toileting - Clothing Manipulation and hygiene: with mod assist;sitting/lateral leans;sit to/from stand  OT Frequency: Min 2X/week   Barriers to D/C: Decreased caregiver support                        End of Session Equipment Utilized During Treatment: Other (comment);Gait belt (BSC)  Activity Tolerance: Patient limited by fatigue Patient left: in bed;with call bell/phone within reach;with family/visitor present   Time: [id number] OT Time Calculation (min): 35 min Charges:  OT General Charges $OT Visit: 1 Procedure OT Evaluation $OT Eval Moderate Complexity: 1 Procedure OT Treatments $Self Care/Home Management : 8-22 mins $Therapeutic Activity: 8-22 mins G-Codes:    Britt Bottom 01/19/2016, 12:42 PM

## 2016-01-19 NOTE — Patient Outreach (Signed)
[facility]) Care Management  01/19/2016  DARSI FERRUCCI Dec 22, 1940 [id number]   [name] spoke with patient's son and he advised that she has been admitted to hospital. [name] will advise [facility] RNCM and follow along.     Eduard Clos, MSW, [place] Worker  [place] [phone]

## 2016-01-19 NOTE — Clinical Social Work Note (Signed)
Clinical Social Work Assessment  Patient Details  Name: [name] [name] MRN: [id number] Date of Birth: Dec 25, 1940  Date of referral:  01/19/16               Reason for consult:  Discharge Planning, Facility Placement                Permission sought to share information with:  Facility Sport and exercise psychologist, Family Supports Permission granted to share information::  Yes, Verbal Permission Granted  Name::     [name] and Holiday representative::  SNFs  Relationship::     Contact Information:     Housing/Transportation Living arrangements for the past 2 months:  Single Family Home Source of Information:  Patient, Adult Children Patient Interpreter Needed:  None Criminal Activity/Legal Involvement Pertinent to Current Situation/Hospitalization:  No - Comment as needed Significant Relationships:  Adult Children, Siblings Lives with:  Adult Children Do you feel safe going back to the place where you live?  Yes Need for family participation in patient care:  No (Coment)  Care giving concerns:  The patient's family has concerns about the patient returning home at discharge initially as no one will be at home with the patient. The patient was initially apprehensive about placement as she historically has disliked placement.   Social Worker assessment / plan:  [name] me with patient and her son [name] [name]. [name], RNCM, and patient/family have determined that the best plan for the patient is to discharge to SNF at least until her CAPS services has been approved. Per son [name], this process has been started and if approved by the state the patient will have 8 hours of care in the home. The family will be available in the evenings so they feel this will be manageable. Until the patient is approved for this, they desire placement at [facility]. [name] has explained SNF search/placement process and answered the patient and family's questions. Family requests that the patient have different physical therapists at  [name], facility has been made ware. [name] will assist with DC when appropriate.   Employment status:  Disabled (Comment on whether or not currently receiving Disability), Retired Advertising copywriter, Medicaid In [place] PT Recommendations:  [place] / Referral to community resources:  [place]  Patient/Family's Response to care:  The patient and family are very happy with the care the patient is receiving. They appreciate [name] involvement with discharge planning.  Patient/Family's Understanding of and Emotional Response to Diagnosis, Current Treatment, and Prognosis:  The patient and family understand the patient's prognosis. The patient shares that she knows she doesn't have much time left and wants to get home as soon as she can.  Emotional Assessment Appearance:  Appears stated age Attitude/Demeanor/Rapport:  Other (Patient appropriate and welcoming of [name].) Affect (typically observed):  Accepting, Appropriate, Calm, Pleasant Orientation:  Oriented to Self, Oriented to Place, Oriented to  Time, Oriented to Situation Alcohol / Substance use:  Not Applicable Psych involvement (Current and /or in the community):  No (Comment)  Discharge Needs  Concerns to be addressed:  Discharge Planning Concerns Readmission within the last 30 days:  No Current discharge risk:  Physical Impairment, Chronically ill Barriers to Discharge:  Continued Medical Work up   Rigoberto Noel, LCSW 01/19/2016, 11:49 PM

## 2016-01-19 NOTE — Evaluation (Signed)
Physical Therapy Evaluation Patient Details Name: [name] [name] MRN: [id number] DOB: May 08, 1940 Today's Date: 01/19/2016   History of Present Illness  pt presents with acute on chronic respiratory failure.  pt with hx of COPD, A-fib, HF, pneumothorax x3, HTN, and Depression.    Clinical Impression  Pt initially refusing PT and any mobility, however pt had urinated herself earlier and had not notified nsg yet.  Pt able to participate in mobility to come to standing for peri hygiene, but refuses any further mobility due to feeling tired and SOB.  Pt at this time thinks she is going to her son's home at D/C, however after PT left room, SW notified PT that pt's son and daughter-in-law both work and feel pt may need SNF.  Feel that SNF may be most appropriate D/C venue for pt unless family decides they are able to provide 24hr care for pt.  Will continue to follow.      Follow Up Recommendations SNF    Equipment Recommendations  None recommended by PT    Recommendations for Other Services       Precautions / Restrictions Precautions Precautions: Fall Precaution Comments: pt on 3L O2 at home. Restrictions Weight Bearing Restrictions: No      Mobility  Bed Mobility Overal bed mobility: Needs Assistance Bed Mobility: Supine to Sit;Sit to Supine     Supine to sit: Min guard Sit to supine: Min assist   General bed mobility comments: pt keeps HOB elevated and with heavy use of UEs is able to bring self to sitting, but needs A with guiding LEs back to bed.    Transfers Overall transfer level: Needs assistance Equipment used: 1 person hand held assist Transfers: Sit to/from Stand Sit to Stand: Min assist         General transfer comment: cues for UE use and encouragement.  pt only agreeable to stand for peri hygiene and declined sitting in recliner.    Ambulation/Gait                Stairs            Wheelchair Mobility    Modified Rankin (Stroke Patients  Only)       Balance Overall balance assessment: Needs assistance;History of Falls Sitting-balance support: Single extremity supported;Bilateral upper extremity supported;Feet supported Sitting balance-Leahy Scale: Poor Sitting balance - Comments: pt seems to rely on UEs for energy conservation more than balance.     Standing balance support: Single extremity supported;During functional activity Standing balance-Leahy Scale: Poor                               Pertinent Vitals/Pain Pain Assessment: No/denies pain    Home Living Family/patient expects to be discharged to:: Unsure                 Additional Comments: pt lives alone, but thinks she will be moving in with her son and daughter-in-law.  SW talked to daughter-in-law who states pt is not moving in with them since they both have to work.      Prior Function Level of Independence: Needs assistance   Gait / Transfers Assistance Needed: uses rollator  ADL's / Homemaking Assistance Needed: children do the cleaning, get meals on wheels and uses microwave meals; she does all of her medication  Comments: Pt does not drive; family assists with grocery shopping     Hand Dominance   Dominant  Hand: Left    Extremity/Trunk Assessment   Upper Extremity Assessment: Defer to OT evaluation           Lower Extremity Assessment: Generalized weakness      Cervical / Trunk Assessment: Kyphotic  Communication   Communication: HOH  Cognition Arousal/Alertness: Awake/alert Behavior During Therapy: WFL for tasks assessed/performed Overall Cognitive Status: Within Functional Limits for tasks assessed                      General Comments      Exercises     Assessment/Plan    PT Assessment Patient needs continued PT services  PT Problem List Decreased strength;Decreased activity tolerance;Decreased balance;Decreased mobility;Decreased knowledge of use of DME;Cardiopulmonary status limiting  activity          PT Treatment Interventions DME instruction;Gait training;Stair training;Functional mobility training;Therapeutic activities;Therapeutic exercise;Balance training;Patient/family education    PT Goals (Current goals can be found in the Care Plan section)  Acute Rehab PT Goals Patient Stated Goal: Go to her son's home PT Goal Formulation: With patient Time For Goal Achievement: 02/02/16 Potential to Achieve Goals: Fair    Frequency Min 3X/week   Barriers to discharge Decreased caregiver support      Co-evaluation               End of Session Equipment Utilized During Treatment: Oxygen Activity Tolerance: Patient limited by fatigue Patient left: in bed;with call bell/phone within reach;with bed alarm set Nurse Communication: Mobility status         Time: [id number] PT Time Calculation (min) (ACUTE ONLY): 27 min   Charges:   PT Evaluation $PT Eval Moderate Complexity: 1 Procedure PT Treatments $Therapeutic Activity: 8-22 mins   PT G CodesCatarina Hartshorn, [name] [name] [phone] 01/19/2016, 10:34 AM

## 2016-01-20 ENCOUNTER — Inpatient Hospital Stay (HOSPITAL_COMMUNITY): Payer: Commercial Managed Care - HMO

## 2016-01-20 LAB — CBC
HCT: 45.7 % (ref 36.0–46.0)
Hemoglobin: 13.6 g/dL (ref 12.0–15.0)
MCH: 26.9 pg (ref 26.0–34.0)
MCHC: 29.8 g/dL — AB (ref 30.0–36.0)
MCV: 90.5 fL (ref 78.0–100.0)
PLATELETS: 139 10*3/uL — AB (ref 150–400)
RBC: 5.05 MIL/uL (ref 3.87–5.11)
RDW: 18.4 % — AB (ref 11.5–15.5)
WBC: 19.1 10*3/uL — ABNORMAL HIGH (ref 4.0–10.5)

## 2016-01-20 LAB — BASIC METABOLIC PANEL
Anion gap: 4 — ABNORMAL LOW (ref 5–15)
BUN: 33 mg/dL — AB (ref 6–20)
CALCIUM: 9.1 mg/dL (ref 8.9–10.3)
CO2: 43 mmol/L — AB (ref 22–32)
CREATININE: 0.75 mg/dL (ref 0.44–1.00)
Chloride: 92 mmol/L — ABNORMAL LOW (ref 101–111)
GFR calc non Af Amer: >60 mL/min (ref >60)
GLUCOSE: 111 mg/dL — AB (ref 65–99)
Potassium: 6.2 mmol/L — ABNORMAL HIGH (ref 3.5–5.1)
Sodium: 139 mmol/L (ref 135–145)

## 2016-01-20 LAB — POTASSIUM
POTASSIUM: 6.3 mmol/L — AB (ref 3.5–5.1)
POTASSIUM: 4.3 mmol/L (ref 3.5–5.1)
Potassium: 6 mmol/L — ABNORMAL HIGH (ref 3.5–5.1)

## 2016-01-20 MED ORDER — SODIUM POLYSTYRENE SULFONATE 15 GM/60ML PO SUSP
45.0000 g | Freq: Once | ORAL | Status: AC
  Administered 2016-01-20: 45 g via ORAL
  Filled 2016-01-20: qty 180

## 2016-01-20 MED ORDER — FUROSEMIDE 10 MG/ML IJ SOLN
40.0000 mg | Freq: Two times a day (BID) | INTRAMUSCULAR | Status: DC
  Administered 2016-01-20 – 2016-01-21 (×2): 40 mg via INTRAVENOUS
  Filled 2016-01-20 (×3): qty 4

## 2016-01-20 NOTE — Plan of Care (Signed)
Problem: Education: Goal: Knowledge of [place] [facility] Education information/materials will improve Outcome: Completed/Met Date Met: 01/20/16 Patient has received written and verbal instructions regarding the unit, new medications, any procedures and new things related to her admission diagnosis and was reinforced throughout her stay with all questions answered and patient verbalized understanding.

## 2016-01-20 NOTE — NC FL2 (Signed)
[place] MEDICAID FL2 LEVEL OF CARE SCREENING TOOL     IDENTIFICATION  Patient Name: [name] [name] Birthdate: [birth year] Sex: female Admission Date (Current Location): 01/17/2016  [facility] and [place] Number:  Herbalist and Address:  [facility] [place]. [facility], [place] [address] [place], [place] [address]      Provider Number: [id number]  Attending Physician Name and Address:  Modena Jansky, MD  Relative Name and Phone Number:       Current Level of Care: Hospital Recommended Level of Care: [place] Prior Approval Number:    Date Approved/Denied:   PASRR Number: [id number]  Discharge Plan: SNF    Current Diagnoses: Patient Active Problem List   Diagnosis Date Noted  . Malnutrition of moderate degree 01/18/2016  . Acute exacerbation of chronic obstructive pulmonary disease (COPD) ([place]) 01/17/2016  . Chronic respiratory failure with hypoxia ([place]) 01/17/2016  . Pleural effusion 01/17/2016  . Atrial fibrillation with RVR ([place]) 12/10/2015  . Acute on chronic respiratory failure with hypoxia and hypercapnia ([place]) 12/10/2015  . Acute diastolic heart failure ([place]) 12/10/2015  . Dysuria 12/10/2015  . HLD (hyperlipidemia) 12/10/2015  . Depression (emotion) 12/10/2015  . GERD (gastroesophageal reflux disease) 12/10/2015  . Pneumothorax, right 11/20/2014  . Pneumothorax   . Recurrent spontaneous pneumothorax   . Pneumothorax on right 10/09/2014  . Pneumothorax on left 09/14/2014  . Essential hypertension   . Microcytic anemia 04/27/2014  . Anemia due to acute blood loss 08/03/2013  . Paroxysmal atrial fibrillation ([place]) 02/03/2012  . Fracture of right hip ([place]) 01/29/2012  . Fall due to stumbling 01/29/2012  . CAD (coronary artery disease) 01/29/2012  . COPD (chronic obstructive pulmonary disease) ([place]) 01/29/2012  . Osteoarthritis 01/29/2012  . Closed fracture of right distal radius 10/13/2011    Class: Acute    Orientation  RESPIRATION BLADDER Height & Weight     Self, Time, Situation, Place  O2 (3L) Incontinent Weight: 63 kg (138 lb 14.4 oz) Height:     BEHAVIORAL SYMPTOMS/MOOD NEUROLOGICAL BOWEL NUTRITION STATUS   (NONE)  (NONE) Incontinent Diet (Heart Healthy)  AMBULATORY STATUS COMMUNICATION OF NEEDS Skin   Extensive Assist Verbally Normal                       Personal Care Assistance Level of Assistance  Bathing, Feeding, Dressing Bathing Assistance: Limited assistance Feeding assistance: Independent Dressing Assistance: Limited assistance     Functional Limitations Info  Sight, Hearing, Speech Sight Info: Adequate Hearing Info: Adequate Speech Info: Adequate    SPECIAL CARE FACTORS FREQUENCY  PT (By licensed PT), OT (By licensed OT)     PT Frequency: 5/week OT Frequency: 5/week            Contractures Contractures Info: Not present    Additional Factors Info  Code Status, Allergies, Psychotropic Code Status Info: DNR Allergies Info: NKDA Psychotropic Info: Zoloft         Current Medications (01/20/2016):  This is the current hospital active medication list Current Facility-Administered Medications  Medication Dose Route Frequency Provider Last Rate Last Dose  . atorvastatin (LIPITOR) tablet 40 mg  40 mg Oral q morning - 10a Maren Reamer, MD   40 mg at 01/19/16 1034  . calcitonin (salmon) (MIACALCIN/FORTICAL) nasal spray 1 spray  1 spray Alternating Nares Daily Maren Reamer, MD   1 spray at 01/19/16 1034  . cyanocobalamin tablet 500 mcg  500 mcg Oral Daily Dawn T  Langeland, MD   500 mcg at 01/19/16 1033  . dabigatran (PRADAXA) capsule 150 mg  150 mg Oral BID Maren Reamer, MD   150 mg at 01/19/16 2128  . digoxin (LANOXIN) tablet 0.125 mg  0.125 mg Oral Daily Maren Reamer, MD   0.125 mg at 01/19/16 1033  . docusate sodium (COLACE) capsule 100 mg  100 mg Oral Q12H Maren Reamer, MD   100 mg at 01/19/16 2129  . feeding supplement (ENSURE ENLIVE) (ENSURE  ENLIVE) liquid 237 mL  237 mL Oral BID BM Maren Reamer, MD   237 mL at 01/19/16 1400  . ferrous sulfate tablet 325 mg  325 mg Oral Q breakfast Maren Reamer, MD   325 mg at 01/19/16 0831  . fluticasone (FLONASE) 50 MCG/ACT nasal spray 1 spray  1 spray Each Nare Daily PRN Maren Reamer, MD      . furosemide (LASIX) injection 20 mg  20 mg Intravenous BID Maren Reamer, MD   20 mg at 01/19/16 1645  . guaiFENesin (MUCINEX) 12 hr tablet 1,200 mg  1,200 mg Oral BID Maren Reamer, MD   1,200 mg at 01/19/16 2128  . HYDROcodone-acetaminophen (NORCO/VICODIN) 5-325 MG per tablet 1 tablet  1 tablet Oral Q6H PRN Maren Reamer, MD   1 tablet at 01/19/16 1929  . levalbuterol (XOPENEX) nebulizer solution 0.63 mg  0.63 mg Nebulization TID Modena Jansky, MD   0.63 mg at 01/19/16 2023  . levalbuterol (XOPENEX) nebulizer solution 0.63 mg  0.63 mg Nebulization Q6H PRN Modena Jansky, MD      . methylPREDNISolone sodium succinate (SOLU-MEDROL) 40 mg/mL injection 40 mg  40 mg Intravenous Q12H Theodis Blaze, MD   40 mg at 01/19/16 1645  . metoprolol tartrate (LOPRESSOR) tablet 50 mg  50 mg Oral BID Maren Reamer, MD   50 mg at 01/19/16 2129  . omega-3 acid ethyl esters (LOVAZA) capsule 1 g  1 g Oral Daily Maren Reamer, MD   1 g at 01/19/16 1033  . oxybutynin (DITROPAN-XL) 24 hr tablet 10 mg  10 mg Oral QHS Maren Reamer, MD   10 mg at 01/19/16 2128  . pantoprazole (PROTONIX) EC tablet 40 mg  40 mg Oral Daily Maren Reamer, MD   40 mg at 01/19/16 1034  . polyethylene glycol (MIRALAX / GLYCOLAX) packet 17 g  17 g Oral Daily Maren Reamer, MD   17 g at 01/19/16 1034  . sertraline (ZOLOFT) tablet 25 mg  25 mg Oral QHS Maren Reamer, MD   25 mg at 01/19/16 2128  . tiotropium (SPIRIVA) inhalation capsule 18 mcg  18 mcg Inhalation Daily Maren Reamer, MD   18 mcg at 01/19/16 0845  . tiZANidine (ZANAFLEX) tablet 2 mg  2 mg Oral QHS Maren Reamer, MD   2 mg at 01/19/16 2129      Discharge Medications: Please see discharge summary for a list of discharge medications.  Relevant Imaging Results:  Relevant Lab Results:   Additional Information SSN: [id number], The patient's family plans to transition the patient home once her CAPS services is approved, this process has already been started. Family state they will need equipment for home when she discharges from SNF.  Rigoberto Noel, LCSW

## 2016-01-20 NOTE — Clinical Social Work Placement (Signed)
   CLINICAL SOCIAL WORK PLACEMENT  NOTE  Date:  01/20/2016  Patient Details  Name: [name] [name] MRN: [id number] Date of Birth: June 27, 1940  Clinical Social Work is seeking post-discharge placement for this patient at the [place] level of care (*[name] will initial, date and re-position this form in  chart as items are completed):  Yes   Patient/family provided with [place] Work Department's list of facilities offering this level of care within the geographic area requested by the patient (or if unable, by the patient's family).  Yes   Patient/family informed of their freedom to choose among providers that offer the needed level of care, that participate in Medicare, Medicaid or managed care program needed by the patient, have an available bed and are willing to accept the patient.  Yes   Patient/family informed of [place]'s ownership interest in [facility] and [facility], as well as of the fact that they are under no obligation to receive care at these facilities.  PASRR submitted to EDS on       PASRR number received on       Existing PASRR number confirmed on 01/20/16     FL2 transmitted to all facilities in geographic area requested by pt/family on 01/20/16     FL2 transmitted to all facilities within larger geographic area on       Patient informed that his/her managed care company has contracts with or will negotiate with certain facilities, including the following:            Patient/family informed of bed offers received.  Patient chooses bed at       Physician recommends and patient chooses bed at      Patient to be transferred to   on  .  Patient to be transferred to facility by       Patient family notified on   of transfer.  Name of family member notified:        PHYSICIAN Please prepare priority discharge summary, including medications, Please prepare prescriptions, Please sign FL2, Please sign DNR      Additional Comment:    _______________________________________________ Rigoberto Noel, LCSW 01/20/2016, 12:06 AM

## 2016-01-20 NOTE — Progress Notes (Signed)
Updated report received via Dawn RN, reviewed VS, new orders, events of the day, POC, assumed care of patient. 

## 2016-01-20 NOTE — Progress Notes (Signed)
Pt's potassium 6.2, MD on call paged. Will cont to monitor pt.

## 2016-01-20 NOTE — Plan of Care (Signed)
Problem: Safety: Goal: Ability to remain free from injury will improve Outcome: Completed/Met Date Met: 01/20/16 Patient instructed throughout her stay regarding the use of her call light and use of the bed alarm as a reminder to call for assistance and patient verbalized understanding. Patient uses her call light appropriately and call light and personal items are within her reach.

## 2016-01-20 NOTE — Progress Notes (Addendum)
Patient ID: [name] [name], female   DOB: 1940/11/12, 75 y.o.   MRN: [id number]    PROGRESS NOTE    [name] [name]  [id number] DOB: 1940/06/13 DOA: 01/17/2016  PCP: Leonides Sake, MD   Brief Narrative:  75 y.o. female with known chronic respiratory failure requiring 3 L of nasal cannula at baseline, COPD, ?paroxysmal atrial fibrillation, chronic diastolic heart failure, history of right-sided pneumothorax 3 requiring tube thoracostomy in the past, coronary artery disease, hypertension, hyperlipidemia, depression, PE and GERD presented with progressively worsening shortness of breath two days in duration. Of note, she was recently hospitalized September 27 and discharged on 12/16/2015 to skilled nursing facility, was there until about 5 days prior to this admission and was then discharged to home. She lives at home alone. Frequent hospitalizations and ED visits.  Assessment & Plan:   Acute on chronic hypoxic and hypercapnic respiratory failure, on home O2 3 L [place] - Multifactorial. Suspect COPD exacerbation as well as acute on chronic diastolic congestive heart failure and atrial fibrillation with RVR - Treat underlying cause and monitor. - Clinically improving, currently back at baseline.  Atrial fibrillation with RVR - CHADsVASC score 3, continue Pradaxa - Keep on telemetry - Continue digoxin, continue metoprolol per home medical regimen but may need to hold if SBP < 90  - Controlled ventricular rate. Off of Cardizem infusion since midnight 01/18/16. RVR likely precipitated by acute respiratory issues.  Acute exacerbation of COPD - Treating with IV Solu-Medrol, bronchodilators scheduled and as needed. Titrate to maintain oxygen saturations between 89-92 percent. Clinically improving. Since patient is improving without initiation of antibiotics, will not start antibiotics at this time.  Hyperkalemia - Appears to be slightly hemolyzed, patient on telemetry monitoring, will repeat stat  potassium level  Acute on chronic diastolic CHF - 123456 echo The estimated ejection fraction was in the range of 55% to 60% - Treating with furosemide 20 mg IV twice a day, patient remains positive balance, thinking this is an accurate, overall weight is coming down, but chest x-ray with no improvement of pleural effusion and moderate edema, so we'll increase IV Lasix from 20-40 IV twice a day. - Please note that cardiology has been called by emergency room physician, Dr. Marlou Porch recommended to continue management but Lasix, Cardizem drip and if patient decompensates place official consult cardiology - Follow-up chest x-ray 01/20/16.  Thrombocytopenia - Mild, possibly reactive - No signs of active bleeding - Stable. Follow CBC periodically.  Hyperlipidemia, mixed - continue lipitor  Essential hypertension  - controlled.  Depression - continued zoloft  Adult failure to thrive - Multifactorial from advanced age and multiple significant medical problems. - As per discussion with son, family is aware of her sustained decline and are hopeful that she may be able to go to SNF at discharge while they make arrangements for her to return home with hospice.  Abnormal TFTs - TSH 1.249/normal. Free T4: 1.27/minimally elevated. Clinically appears euthyroid. Recommend follow-up TFTs in 4-6 weeks.    DVT prophylaxis: Pradaxa Code Status: Patient was admitted as full code. Dr. Algis Liming Discussed with patient's son who changed her status to DO NOT RESUSCITATE.  Family Communication: Discussed with patient, none at bedside Disposition Plan: SNF when medically stable, possibly in the next 1-2 days and appropriately diuresed  Consultants:   None   Procedures:   None  Antimicrobials:   None   Subjective: Patient seen this morning. Indicates that her dyspnea has improved. Denies cough,palpitations. Complains of leg swelling which  has slightly improved. Reports left-sided muscular skeletal  chest pain reproducible by palpation, and on coughing.  Objective: Vitals:   01/20/16 0500 01/20/16 0838 01/20/16 0906 01/20/16 0919  BP: 139/88 (!) 133/99    Pulse: 85 91    Resp: 14 16    Temp: 98.4 F (36.9 C) 97.8 F (36.6 C)    TempSrc: Oral Oral    SpO2: 100% 100% 100% 99%  Weight: 60.3 kg (132 lb 15 oz)     Respiratory rate 21/m   Intake/Output Summary (Last 24 hours) at 01/20/16 1029 Last data filed at 01/19/16 2200  Gross per 24 hour  Intake              817 ml  Output              150 ml  Net              667 ml   Filed Weights   01/19/16 0437 01/19/16 1604 01/20/16 0500  Weight: 61.6 kg (135 lb 11.2 oz) 63 kg (138 lb 14.4 oz) 60.3 kg (132 lb 15 oz)    Examination:  General exam: Appears calm and comfortable. Frail pleasant elderly female, chronically ill-looking lying comfortably propped up in bed without distress. Hard of hearing.  Respiratory system: diminished breath sounds in the bases with few basal crackles. Rest of lung fields mostly clear except occasional expiratory rhonchi. No increased work of breathing.  Cardiovascular system: S1 and S2 heard, irregularly irregular. No JVD or murmurs. 2+ pitting bilateral leg edema to knees.  Gastrointestinal system: Abdomen is nondistended, soft and nontender. No organomegaly or masses felt. Normal bowel sounds heard. Central nervous system: Alert and oriented. No focal neurological deficits.  Data Reviewed: I have personally reviewed following labs and imaging studies  CBC:  Recent Labs Lab 01/17/16 0555 01/18/16 0328 01/19/16 0459 01/20/16 0641  WBC 8.7 12.5* 18.9* 19.1*  NEUTROABS 6.9  --   --   --   HGB 13.8 11.7* 12.1 13.6  HCT 47.9* 40.8 42.4 45.7  MCV 93.7 91.9 92.8 90.5  PLT 140* 107* 128* [id number]*   Basic Metabolic Panel:  Recent Labs Lab 01/17/16 0555 01/17/16 0922 01/18/16 0328 01/19/16 0459 01/20/16 0641  NA 145  --  142 138 139  K 4.1  --  4.4 4.7 6.3*  6.2*  CL 99*  --  93* 89* 92*    CO2 37*  --  41* 41* 43*  GLUCOSE 132*  --  146* 138* 111*  BUN 11  --  13 27* 33*  CREATININE 0.52  --  0.64 0.75 0.75  CALCIUM 9.6  --  8.6* 8.6* 9.1  MG  --  1.8  --   --   --    No results for input(s): TSH, T4TOTAL, FREET4, T3FREE, THYROIDAB in the last 72 hours. Radiology Studies: Dg Chest 2 View  Result Date: 01/20/2016 CLINICAL DATA:  75 year old female with congestive heart failure EXAM: CHEST  2 VIEW COMPARISON:  Prior chest x-ray 01/17/2016 FINDINGS: Slightly increased bilateral layering pleural effusions and bibasilar atelectasis compared to prior. The right pleural effusion is larger than the left. Background of mild interstitial edema superimposed on emphysema and chronic bronchitic changes. Stable cardiomegaly. Atherosclerotic calcification present in the transverse aorta. Stable right apical pleural parenchymal scarring. Remote right proximal humeral fracture again identified. No acute osseous abnormality. IMPRESSION: 1. Slightly increased right greater than left pleural effusions and associated bibasilar atelectasis. 2. Persistent mild interstitial pulmonary edema superimposed  on a background of emphysema and chronic bronchitic changes. Emphysema. (ICD10-J43.9) 3.  Aortic Atherosclerosis (ICD10-170.0) Electronically Signed   By: Jacqulynn Cadet M.D.   On: 01/20/2016 09:47    Scheduled Meds: . atorvastatin  40 mg Oral q morning - 10a  . calcitonin (salmon)  1 spray Alternating Nares Daily  . cyanocobalamin  500 mcg Oral Daily  . dabigatran  150 mg Oral BID  . digoxin  0.125 mg Oral Daily  . docusate sodium  100 mg Oral Q12H  . feeding supplement (ENSURE ENLIVE)  237 mL Oral BID BM  . ferrous sulfate  325 mg Oral Q breakfast  . furosemide  20 mg Intravenous BID  . guaiFENesin  1,200 mg Oral BID  . levalbuterol  0.63 mg Nebulization TID  . methylPREDNISolone (SOLU-MEDROL) injection  40 mg Intravenous Q12H  . metoprolol  50 mg Oral BID  . omega-3 acid ethyl esters  1 g  Oral Daily  . oxybutynin  10 mg Oral QHS  . pantoprazole  40 mg Oral Daily  . polyethylene glycol  17 g Oral Daily  . sertraline  25 mg Oral QHS  . tiotropium  18 mcg Inhalation Daily  . tiZANidine  2 mg Oral QHS   Continuous Infusions:    LOS: 3 days      ELGERGAWY, DAWOOD, MD [facility] Pager [phone]  If 7PM-7AM, please contact night-coverage [email or web address] Password [email or web address] 01/20/2016, 10:29 AM

## 2016-01-21 DIAGNOSIS — J449 Chronic obstructive pulmonary disease, unspecified: Secondary | ICD-10-CM

## 2016-01-21 DIAGNOSIS — I2510 Atherosclerotic heart disease of native coronary artery without angina pectoris: Secondary | ICD-10-CM

## 2016-01-21 LAB — BASIC METABOLIC PANEL
ANION GAP: 9 (ref 5–15)
BUN: 27 mg/dL — AB (ref 6–20)
CALCIUM: 8.6 mg/dL — AB (ref 8.9–10.3)
CO2: 41 mmol/L — AB (ref 22–32)
CREATININE: 0.77 mg/dL (ref 0.44–1.00)
Chloride: 93 mmol/L — ABNORMAL LOW (ref 101–111)
GFR calc Af Amer: >60 mL/min (ref >60)
GLUCOSE: 118 mg/dL — AB (ref 65–99)
Potassium: 5.8 mmol/L — ABNORMAL HIGH (ref 3.5–5.1)
Sodium: 143 mmol/L (ref 135–145)

## 2016-01-21 MED ORDER — PREDNISONE 20 MG PO TABS
40.0000 mg | ORAL_TABLET | Freq: Every day | ORAL | Status: DC
  Administered 2016-01-22: 40 mg via ORAL
  Filled 2016-01-21: qty 2

## 2016-01-21 MED ORDER — DILTIAZEM HCL ER 90 MG PO CP12
180.0000 mg | ORAL_CAPSULE | Freq: Two times a day (BID) | ORAL | Status: DC
Start: 2016-01-21 — End: 2016-01-22
  Administered 2016-01-21 – 2016-01-22 (×3): 180 mg via ORAL
  Filled 2016-01-21 (×3): qty 2

## 2016-01-21 MED ORDER — FUROSEMIDE 10 MG/ML IJ SOLN
60.0000 mg | Freq: Two times a day (BID) | INTRAMUSCULAR | Status: DC
  Administered 2016-01-21 – 2016-01-22 (×2): 60 mg via INTRAVENOUS
  Filled 2016-01-21 (×2): qty 6

## 2016-01-21 NOTE — Progress Notes (Signed)
Physical Therapy Treatment Patient Details Name: [name] [name] MRN: [id number] DOB: [birth year] Today's Date: 01/21/2016    History of Present Illness pt presents with acute on chronic respiratory failure.  pt with hx of COPD, A-fib, HF, pneumothorax x3, HTN, and Depression.      PT Comments    Patient progressing slowly towards PT goals. Tolerated gait training today with Min A for balance/safety. Continues to be unsteady on feet requiring hands on assist. VSS throughout. Pt highly motivated to improve mobility. Agreeable to SNF. Encouraged OOB to chair as much as tolerated. Will follow.   Follow Up Recommendations  SNF     Equipment Recommendations  None recommended by PT    Recommendations for Other Services       Precautions / Restrictions Precautions Precautions: Fall Precaution Comments: pt on 3L O2 at home. Restrictions Weight Bearing Restrictions: No    Mobility  Bed Mobility Overal bed mobility: Needs Assistance Bed Mobility: Supine to Sit     Supine to sit: Min guard;HOB elevated     General bed mobility comments: Effortful to elevate trunk with heavy use of UEs to get to EOB.   Transfers Overall transfer level: Needs assistance Equipment used: Rolling walker (2 wheeled) Transfers: Sit to/from Stand Sit to Stand: Min guard         General transfer comment: Min guard for safety in standing. + dizziness.  transferred to chair post ambulation bout.  Ambulation/Gait Ambulation/Gait assistance: Min assist Ambulation Distance (Feet): 20 Feet Assistive device: Rolling walker (2 wheeled) Gait Pattern/deviations: Step-through pattern;Decreased stride length;Trunk flexed Gait velocity: decreased   General Gait Details: Slow, unsteady gait. HR up to 125 bpm. Sp02 >97% on 3L/min 02.    Stairs            Wheelchair Mobility    Modified Rankin (Stroke Patients Only)       Balance Overall balance assessment: Needs assistance Sitting-balance  support: Feet supported;No upper extremity supported Sitting balance-Leahy Scale: Fair     Standing balance support: During functional activity Standing balance-Leahy Scale: Poor Standing balance comment: Reliant on BUEs for support.                     Cognition Arousal/Alertness: Awake/alert Behavior During Therapy: WFL for tasks assessed/performed Overall Cognitive Status: Within Functional Limits for tasks assessed                      Exercises      General Comments        Pertinent Vitals/Pain Pain Assessment: No/denies pain    Home Living                      Prior Function            PT Goals (current goals can now be found in the care plan section) Progress towards PT goals: Progressing toward goals    Frequency    Min 3X/week      PT Plan Current plan remains appropriate    Co-evaluation             End of Session Equipment Utilized During Treatment: Oxygen Activity Tolerance: Patient tolerated treatment well Patient left: in chair;with call bell/phone within reach     Time: 1520-1543 PT Time Calculation (min) (ACUTE ONLY): 23 min  Charges:  $Gait Training: 8-22 mins $Therapeutic Activity: 8-22 mins  G Codes:      Yuliya Nova A Terrisa Curfman 01/21/2016, 4:23 PM Wray Kearns, [place], DPT [phone]

## 2016-01-21 NOTE — Progress Notes (Signed)
Patient ID: [name] [name], female   DOB: [birth year], 75 y.o.   MRN: [id number]    PROGRESS NOTE    [name] [name]  [id number] DOB: 02-Aug-1940 DOA: 01/17/2016  PCP: Leonides Sake, MD   Brief Narrative:  75 y.o. female with known chronic respiratory failure requiring 3 L of nasal cannula at baseline, COPD, ?paroxysmal atrial fibrillation, chronic diastolic heart failure, history of right-sided pneumothorax 3 requiring tube thoracostomy in the past, coronary artery disease, hypertension, hyperlipidemia, depression, PE and GERD presented with progressively worsening shortness of breath two days in duration. Of note, she was recently hospitalized September 27 and discharged on 12/16/2015 to skilled nursing facility, was there until about 5 days prior to this admission and was then discharged to home. She lives at home alone. Frequent hospitalizations and ED visits.  Assessment & Plan:   Acute on chronic hypoxic and hypercapnic respiratory failure, on home O2 3 L [place] - Multifactorial. Suspect COPD exacerbation as well as acute on chronic diastolic congestive heart failure and atrial fibrillation with RVR - Treat underlying cause and monitor. - Clinically improving, cut back on steroids  Atrial fibrillation with RVR, patient is followed by Dr. Einar Gip - CHADsVASC score 3, continue Pradaxa - Keep on telemetry, heart rate 49-122 - Continue digoxin, continue metoprolol per home medical regimen but may need to hold if SBP < 90  - Controlled ventricular rate. Off of Cardizem infusion since midnight 01/18/16. Restarted oral diltiazem 180 mg twice a day after discussing with cardiology RVR likely precipitated by acute respiratory issues.  Acute exacerbation of COPD - Change IV Solu-Medrol to prednisone, bronchodilators scheduled and as needed. Titrate to maintain oxygen saturations between 89-92 percent. Clinically improving. Since patient is improving without initiation of antibiotics, will not  start antibiotics at this time.  Hyperkalemia - Appears to be slightly hemolyzed, patient on telemetry monitoring, potassium 5.8 today  Acute on chronic diastolic CHF - 123456 echo The estimated ejection fraction was in the range of 55% to 60% - Treating with furosemide increased to 60 mg IV twice a day, patient remains positive balance, thinking this is an accurate, overall weight is coming down, but chest x-ray with no improvement of pleural effusion and moderate edema,  renal function stable - Please note that cardiology has been called by emergency room physician, Dr. Marlou Porch recommended to continue management as above, if patient decompensates place official consult cardiology - Follow-up chest x-ray 01/20/16.  Thrombocytopenia - Mild, possibly reactive - No signs of active bleeding - Stable. Follow CBC periodically.  Hyperlipidemia, mixed - continue lipitor  Essential hypertension  - controlled.  Depression - continued zoloft  Adult failure to thrive - Multifactorial from advanced age and multiple significant medical problems. - As per discussion with son, family is aware of her sustained decline and are hopeful that she may be able to go to SNF at discharge while they make arrangements for her to return home with hospice.  Abnormal TFTs - TSH 1.249/normal. Free T4: 1.27/minimally elevated. Clinically appears euthyroid. Recommend follow-up TFTs in 4-6 weeks.  Diarrhea Patient was receiving Colace and MiraLAX These have been discontinued C. difficile PCR ordered in case patient continues to have diarrhea after discontinuation of these medications    DVT prophylaxis: Pradaxa Code Status: Patient was admitted as full code. Dr. Algis Liming Discussed with patient's son who changed her status to DO NOT RESUSCITATE.  Family Communication: Discussed with patient, none at bedside Disposition Plan: SNF when medically stable, possibly in the next 1-2  days and appropriately  diuresed  Consultants:   None   Procedures:   None  Antimicrobials:   None   Subjective: Patient found to be in atrial fibrillation with rapid ventricular response  Objective: Vitals:   01/21/16 0033 01/21/16 0453 01/21/16 0740 01/21/16 0756  BP: 131/80 (!) 164/95  (!) 158/88  Pulse: (!) 52 (!) 49  64  Resp: 15 15  (!) 22  Temp: 98.2 F (36.8 C) 97.6 F (36.4 C)  98.5 F (36.9 C)  TempSrc: Oral Oral  Oral  SpO2: 91% 100% 99% 97%  Weight:  61.1 kg (134 lb 11.2 oz)    Respiratory rate 21/m   Intake/Output Summary (Last 24 hours) at 01/21/16 1200 Last data filed at 01/20/16 1300  Gross per 24 hour  Intake              135 ml  Output                0 ml  Net              135 ml   Filed Weights   01/19/16 1604 01/20/16 0500 01/21/16 0453  Weight: 63 kg (138 lb 14.4 oz) 60.3 kg (132 lb 15 oz) 61.1 kg (134 lb 11.2 oz)    Examination:  General exam: Appears calm and comfortable. Frail pleasant elderly female, chronically ill-looking lying comfortably propped up in bed without distress. Hard of hearing.  Respiratory system: diminished breath sounds in the bases with few basal crackles. Rest of lung fields mostly clear except occasional expiratory rhonchi. No increased work of breathing.  Cardiovascular system: S1 and S2 heard, irregularly irregular. No JVD or murmurs. 2+ pitting bilateral leg edema to knees.  Gastrointestinal system: Abdomen is nondistended, soft and nontender. No organomegaly or masses felt. Normal bowel sounds heard. Central nervous system: Alert and oriented. No focal neurological deficits.  Data Reviewed: I have personally reviewed following labs and imaging studies  CBC:  Recent Labs Lab 01/17/16 0555 01/18/16 0328 01/19/16 0459 01/20/16 0641  WBC 8.7 12.5* 18.9* 19.1*  NEUTROABS 6.9  --   --   --   HGB 13.8 11.7* 12.1 13.6  HCT 47.9* 40.8 42.4 45.7  MCV 93.7 91.9 92.8 90.5  PLT 140* 107* 128* [id number]*   Basic Metabolic Panel:  Recent  Labs Lab 01/17/16 0555 01/17/16 0922 01/18/16 0328 01/19/16 0459 01/20/16 0641 01/20/16 0947 01/20/16 1545 01/21/16 0442  NA 145  --  142 138 139  --   --  143  K 4.1  --  4.4 4.7 6.3*  6.2* 6.0* 4.3 5.8*  CL 99*  --  93* 89* 92*  --   --  93*  CO2 37*  --  41* 41* 43*  --   --  41*  GLUCOSE 132*  --  146* 138* 111*  --   --  118*  BUN 11  --  13 27* 33*  --   --  27*  CREATININE 0.52  --  0.64 0.75 0.75  --   --  0.77  CALCIUM 9.6  --  8.6* 8.6* 9.1  --   --  8.6*  MG  --  1.8  --   --   --   --   --   --    No results for input(s): TSH, T4TOTAL, FREET4, T3FREE, THYROIDAB in the last 72 hours. Radiology Studies: Dg Chest 2 View  Result Date: 01/20/2016 CLINICAL DATA:  75 year old female with congestive  heart failure EXAM: CHEST  2 VIEW COMPARISON:  Prior chest x-ray 01/17/2016 FINDINGS: Slightly increased bilateral layering pleural effusions and bibasilar atelectasis compared to prior. The right pleural effusion is larger than the left. Background of mild interstitial edema superimposed on emphysema and chronic bronchitic changes. Stable cardiomegaly. Atherosclerotic calcification present in the transverse aorta. Stable right apical pleural parenchymal scarring. Remote right proximal humeral fracture again identified. No acute osseous abnormality. IMPRESSION: 1. Slightly increased right greater than left pleural effusions and associated bibasilar atelectasis. 2. Persistent mild interstitial pulmonary edema superimposed on a background of emphysema and chronic bronchitic changes. Emphysema. (ICD10-J43.9) 3.  Aortic Atherosclerosis (ICD10-170.0) Electronically Signed   By: Jacqulynn Cadet M.D.   On: 01/20/2016 09:47    Scheduled Meds: . atorvastatin  40 mg Oral q morning - 10a  . calcitonin (salmon)  1 spray Alternating Nares Daily  . cyanocobalamin  500 mcg Oral Daily  . dabigatran  150 mg Oral BID  . digoxin  0.125 mg Oral Daily  . docusate sodium  100 mg Oral Q12H  . feeding  supplement (ENSURE ENLIVE)  237 mL Oral BID BM  . ferrous sulfate  325 mg Oral Q breakfast  . furosemide  60 mg Intravenous BID  . guaiFENesin  1,200 mg Oral BID  . levalbuterol  0.63 mg Nebulization TID  . metoprolol  50 mg Oral BID  . omega-3 acid ethyl esters  1 g Oral Daily  . oxybutynin  10 mg Oral QHS  . pantoprazole  40 mg Oral Daily  . polyethylene glycol  17 g Oral Daily  . [START ON 01/22/2016] predniSONE  40 mg Oral Q breakfast  . sertraline  25 mg Oral QHS  . tiotropium  18 mcg Inhalation Daily  . tiZANidine  2 mg Oral QHS   Continuous Infusions:    LOS: 4 days      Reyne Dumas, MD [facility] Pager [phone]  If 7PM-7AM, please contact night-coverage [email or web address] Password [email or web address] 01/21/2016, 12:00 PM

## 2016-01-21 NOTE — Care Management Important Message (Signed)
Important Message  Patient Details  Name: [name] [name] MRN: [id number] Date of Birth: April 16, 1940   Medicare Important Message Given:  Yes    Nathen May 01/21/2016, 10:41 AM

## 2016-01-21 NOTE — Consult Note (Signed)
   [facility] CM Inpatient Consult   01/21/2016  BRYLIN STOPPER 1940/06/23 [id number]   Met with patient today.  She states she realizes she is in need of more assistance this last couple of admissions.  She states she has accepted the fact that she is unablt ot care for herself any longer.  Became teary-eyed.  States, "I just didn't want to interrupt my son's life like this.  I realize they all work in all and I thought I could do it.  But the only person I was seeing was the delivery person for Me[employer] Whe[employer] everyday.  I think I am going to have to live with my son but I will need to get stronger.  Patient consents to post hospital follow up.  She states her son is in the process of moving all of her stuff out of the apartment.  States, "I don't know how all of this is going to pan out but I know I will be better off than I am now."  [facility] active, will continue to follow.  For questions, please contact:  Natividad Brood, RN BSN [facility] Liaison  [phone] business mobile phone Toll free office [phone]

## 2016-01-22 ENCOUNTER — Other Ambulatory Visit: Payer: Self-pay | Admitting: Licensed Clinical Social Worker

## 2016-01-22 DIAGNOSIS — J9611 Chronic respiratory failure with hypoxia: Secondary | ICD-10-CM | POA: Diagnosis not present

## 2016-01-22 DIAGNOSIS — K5909 Other constipation: Secondary | ICD-10-CM | POA: Diagnosis not present

## 2016-01-22 DIAGNOSIS — J9622 Acute and chronic respiratory failure with hypercapnia: Secondary | ICD-10-CM | POA: Diagnosis not present

## 2016-01-22 DIAGNOSIS — I5031 Acute diastolic (congestive) heart failure: Secondary | ICD-10-CM | POA: Diagnosis not present

## 2016-01-22 DIAGNOSIS — Z79899 Other long term (current) drug therapy: Secondary | ICD-10-CM | POA: Diagnosis not present

## 2016-01-22 DIAGNOSIS — N029 Recurrent and persistent hematuria with unspecified morphologic changes: Secondary | ICD-10-CM | POA: Diagnosis not present

## 2016-01-22 DIAGNOSIS — R0609 Other forms of dyspnea: Secondary | ICD-10-CM | POA: Diagnosis not present

## 2016-01-22 DIAGNOSIS — Z049 Encounter for examination and observation for unspecified reason: Secondary | ICD-10-CM | POA: Diagnosis not present

## 2016-01-22 DIAGNOSIS — R0600 Dyspnea, unspecified: Secondary | ICD-10-CM | POA: Diagnosis not present

## 2016-01-22 DIAGNOSIS — I2510 Atherosclerotic heart disease of native coronary artery without angina pectoris: Secondary | ICD-10-CM | POA: Diagnosis not present

## 2016-01-22 DIAGNOSIS — I6523 Occlusion and stenosis of bilateral carotid arteries: Secondary | ICD-10-CM | POA: Diagnosis not present

## 2016-01-22 DIAGNOSIS — I509 Heart failure, unspecified: Secondary | ICD-10-CM | POA: Diagnosis not present

## 2016-01-22 DIAGNOSIS — M6281 Muscle weakness (generalized): Secondary | ICD-10-CM | POA: Diagnosis not present

## 2016-01-22 DIAGNOSIS — I480 Paroxysmal atrial fibrillation: Secondary | ICD-10-CM | POA: Diagnosis not present

## 2016-01-22 DIAGNOSIS — R278 Other lack of coordination: Secondary | ICD-10-CM | POA: Diagnosis not present

## 2016-01-22 DIAGNOSIS — I5032 Chronic diastolic (congestive) heart failure: Secondary | ICD-10-CM | POA: Diagnosis not present

## 2016-01-22 DIAGNOSIS — I4891 Unspecified atrial fibrillation: Secondary | ICD-10-CM

## 2016-01-22 DIAGNOSIS — J9612 Chronic respiratory failure with hypercapnia: Secondary | ICD-10-CM | POA: Diagnosis not present

## 2016-01-22 DIAGNOSIS — D72828 Other elevated white blood cell count: Secondary | ICD-10-CM | POA: Diagnosis not present

## 2016-01-22 DIAGNOSIS — J432 Centrilobular emphysema: Secondary | ICD-10-CM | POA: Diagnosis not present

## 2016-01-22 DIAGNOSIS — R262 Difficulty in walking, not elsewhere classified: Secondary | ICD-10-CM | POA: Diagnosis not present

## 2016-01-22 DIAGNOSIS — R531 Weakness: Secondary | ICD-10-CM | POA: Diagnosis not present

## 2016-01-22 DIAGNOSIS — J441 Chronic obstructive pulmonary disease with (acute) exacerbation: Secondary | ICD-10-CM | POA: Diagnosis not present

## 2016-01-22 DIAGNOSIS — I130 Hypertensive heart and chronic kidney disease with heart failure and stage 1 through stage 4 chronic kidney disease, or unspecified chronic kidney disease: Secondary | ICD-10-CM | POA: Diagnosis not present

## 2016-01-22 DIAGNOSIS — I10 Essential (primary) hypertension: Secondary | ICD-10-CM | POA: Diagnosis not present

## 2016-01-22 DIAGNOSIS — I482 Chronic atrial fibrillation: Secondary | ICD-10-CM | POA: Diagnosis not present

## 2016-01-22 DIAGNOSIS — R0789 Other chest pain: Secondary | ICD-10-CM | POA: Diagnosis not present

## 2016-01-22 DIAGNOSIS — R079 Chest pain, unspecified: Secondary | ICD-10-CM | POA: Diagnosis not present

## 2016-01-22 DIAGNOSIS — J449 Chronic obstructive pulmonary disease, unspecified: Secondary | ICD-10-CM | POA: Diagnosis not present

## 2016-01-22 DIAGNOSIS — R1311 Dysphagia, oral phase: Secondary | ICD-10-CM | POA: Diagnosis not present

## 2016-01-22 DIAGNOSIS — J9621 Acute and chronic respiratory failure with hypoxia: Secondary | ICD-10-CM | POA: Diagnosis not present

## 2016-01-22 DIAGNOSIS — R41841 Cognitive communication deficit: Secondary | ICD-10-CM | POA: Diagnosis not present

## 2016-01-22 LAB — COMPREHENSIVE METABOLIC PANEL
ALBUMIN: 2.5 g/dL — AB (ref 3.5–5.0)
ALK PHOS: 49 U/L (ref 38–126)
ALT: 27 U/L (ref 14–54)
ANION GAP: 8 (ref 5–15)
AST: 20 U/L (ref 15–41)
BUN: 26 mg/dL — ABNORMAL HIGH (ref 6–20)
CO2: 45 mmol/L — AB (ref 22–32)
Calcium: 8.2 mg/dL — ABNORMAL LOW (ref 8.9–10.3)
Chloride: 87 mmol/L — ABNORMAL LOW (ref 101–111)
Creatinine, Ser: 0.63 mg/dL (ref 0.44–1.00)
GFR calc Af Amer: >60 mL/min (ref >60)
GFR calc non Af Amer: >60 mL/min (ref >60)
GLUCOSE: 83 mg/dL (ref 65–99)
POTASSIUM: 3.3 mmol/L — AB (ref 3.5–5.1)
SODIUM: 140 mmol/L (ref 135–145)
Total Bilirubin: 0.6 mg/dL (ref 0.3–1.2)
Total Protein: 4.9 g/dL — ABNORMAL LOW (ref 6.5–8.1)

## 2016-01-22 LAB — CBC
HEMATOCRIT: 42.9 % (ref 36.0–46.0)
HEMOGLOBIN: 12.9 g/dL (ref 12.0–15.0)
MCH: 27.3 pg (ref 26.0–34.0)
MCHC: 30.1 g/dL (ref 30.0–36.0)
MCV: 90.7 fL (ref 78.0–100.0)
Platelets: 111 10*3/uL — ABNORMAL LOW (ref 150–400)
RBC: 4.73 MIL/uL (ref 3.87–5.11)
RDW: 18.2 % — ABNORMAL HIGH (ref 11.5–15.5)
WBC: 10.6 10*3/uL — ABNORMAL HIGH (ref 4.0–10.5)

## 2016-01-22 MED ORDER — DILTIAZEM HCL ER COATED BEADS 360 MG PO CP24
240.0000 mg | ORAL_CAPSULE | Freq: Every morning | ORAL | Status: DC
Start: 2016-01-22 — End: 2016-01-22

## 2016-01-22 MED ORDER — HYDROCODONE-ACETAMINOPHEN 5-325 MG PO TABS: 1.0000 | ORAL_TABLET | ORAL | 0 refills | Status: DC | PRN

## 2016-01-22 MED ORDER — DILTIAZEM HCL ER COATED BEADS 240 MG PO TB24: 240.0000 mg | ORAL_TABLET | Freq: Every day | ORAL | 1 refills | Status: DC

## 2016-01-22 MED ORDER — FUROSEMIDE 40 MG PO TABS: 60.0000 mg | ORAL_TABLET | Freq: Every day | ORAL | 1 refills | Status: DC

## 2016-01-22 MED ORDER — LEVALBUTEROL HCL 1.25 MG/3ML IN NEBU: 1.2500 mg | INHALATION_SOLUTION | RESPIRATORY_TRACT | 12 refills | Status: DC | PRN

## 2016-01-22 MED ORDER — PREDNISONE 10 MG PO TABS: ORAL_TABLET | ORAL | 0 refills | Status: DC

## 2016-01-22 NOTE — Progress Notes (Signed)
Pt K+ was 3.3 this Am. MD notified.  Ferdinand Lango, RN

## 2016-01-22 NOTE — Clinical Social Work Placement (Signed)
   CLINICAL SOCIAL WORK PLACEMENT  NOTE  Date:  01/22/2016  Patient Details  Name: [name] [name] MRN: [id number] Date of Birth: May 26, 1940  Clinical Social Work is seeking post-discharge placement for this patient at the Gor[place] level of care (*[name] will initial, date and re-position this form in  chart as items are completed):  Yes   Patient/family provided with [facility] [place] Work Department's list of facilities offering this level of care within the geographic area requested by the patient (or if unable, by the patient's family).  Yes   Patient/family informed of their freedom to choose among providers that offer the needed level of care, that participate in Medicare, Medicaid or managed care program needed by the patient, have an available bed and are willing to accept the patient.  Yes   Patient/family informed of [facility]'s ownership interest in [facility] and [facility], as well as of the fact that they are under no obligation to receive care at these facilities.  PASRR submitted to EDS on       PASRR number received on       Existing PASRR number confirmed on 01/20/16     FL2 transmitted to all facilities in geographic area requested by pt/family on 01/20/16     FL2 transmitted to all facilities within larger geographic area on       Patient informed that his/her managed care company has contracts with or will negotiate with certain facilities, including the following:        Yes   Patient/family informed of bed offers received.  Patient chooses bed at [facility]     Physician recommends and patient chooses bed at      Patient to be transferred to [facility] on 01/22/16.  Patient to be transferred to facility by Ambulance     Patient family notified on 01/22/16 of transfer.  Name of family member notified:  [name]     PHYSICIAN Please prepare priority discharge summary, including medications, Please prepare  prescriptions, Please sign FL2, Please sign DNR     Additional Comment:   Per MD patient ready for DC to [facility]. RN, patient, patient's family, and facility notified of DC. RN given number for report. DC packet on chart. Ambulance transport requested for patient. [name] signing off.  _______________________________________________ Rigoberto Noel, LCSW 01/22/2016, 2:22 PM

## 2016-01-22 NOTE — Discharge Summary (Signed)
Physician Discharge Summary  [name] [name] MRN: [id number] DOB/AGE: 12/05/1940 75 y.o.  PCP: Leonides Sake, MD   Admit date: 01/17/2016 Discharge date: 01/22/2016  Discharge Diagnoses:    Principal Problem:   Acute on chronic respiratory failure with hypoxia and hypercapnia (HCC) Active Problems:   CAD (coronary artery disease)   Essential hypertension   Atrial fibrillation with RVR (HCC)   Acute diastolic heart failure (HCC)   HLD (hyperlipidemia)   GERD (gastroesophageal reflux disease)   Acute exacerbation of chronic obstructive pulmonary disease (COPD) (HCC)   Pleural effusion   Malnutrition of moderate degree    Follow-up recommendations Follow-up with PCP in 3-5 days , including all  additional recommended appointments as below Follow-up CBC, CMP in 3-5 days Please check digoxin level in 3-5 days Please follow-up with cardiology within the next 1 week for atrial fibrillation Recommend follow-up TFTs in 4-6 weeks.     Current Discharge Medication List    START taking these medications   Details  diltiazem (CARDIZEM LA) 240 MG 24 hr tablet Take 1 tablet (240 mg total) by mouth daily. Qty: 30 tablet, Refills: 1    levalbuterol (XOPENEX) 1.25 MG/3ML nebulizer solution Take 1.25 mg by nebulization every 4 (four) hours as needed for wheezing. Qty: 72 mL, Refills: 12    predniSONE (DELTASONE) 10 MG tablet 6 tablets 3 days, 5 tablets 3 days, 4 tablets 3 days, 3 tablets 3 days, 2 tablets 3 days, 1 tablet 3 days, half a tablet 3 days then DC Qty: 120 tablet, Refills: 0      CONTINUE these medications which have CHANGED   Details  furosemide (LASIX) 40 MG tablet Take 1.5 tablets (60 mg total) by mouth daily. Qty: 30 tablet, Refills: 1    HYDROcodone-acetaminophen (NORCO/VICODIN) 5-325 MG tablet Take 1 tablet by mouth every 4 (four) hours as needed. Qty: 10 tablet, Refills: 0      CONTINUE these medications which have NOT CHANGED   Details  alendronate  (FOSAMAX) 70 MG tablet Take 70 mg by mouth once a week. Take with a full glass of water on an empty stomach.    atorvastatin (LIPITOR) 40 MG tablet Take 40 mg by mouth every morning.     calcitonin, salmon, (MIACALCIN/FORTICAL) 200 UNIT/ACT nasal spray Place 1 spray into alternate nostrils daily.    Cranberry-Vitamin C-Probiotic (AZO CRANBERRY PO) Take 2 capsules by mouth daily.    cyanocobalamin 500 MCG tablet Take 500 mcg by mouth daily.    dabigatran (PRADAXA) 150 MG CAPS capsule Take 150 mg by mouth 2 (two) times daily.    digoxin (LANOXIN) 0.125 MG tablet Take 1 tablet (0.125 mg total) by mouth daily.    docusate sodium (COLACE) 100 MG capsule Take 1 capsule (100 mg total) by mouth every 12 (twelve) hours. Qty: 60 capsule, Refills: 0    ferrous sulfate 325 (65 FE) MG tablet Take 325 mg by mouth daily with breakfast.    fluticasone (FLONASE) 50 MCG/ACT nasal spray Place 1 spray into both nostrils daily as needed for allergies or rhinitis.    guaiFENesin (MUCINEX) 600 MG 12 hr tablet Take 2 tablets (1,200 mg total) by mouth 2 (two) times daily.    metoprolol (LOPRESSOR) 50 MG tablet Take 1 tablet (50 mg total) by mouth 2 (two) times daily.    Omega-3 Fatty Acids (FISH OIL) 1000 MG CAPS Take 1,000 mg by mouth daily.     oxybutynin (DITROPAN-XL) 5 MG 24 hr tablet Take 10 mg by  mouth at bedtime.    pantoprazole (PROTONIX) 40 MG tablet Take 1 tablet (40 mg total) by mouth daily. Qty: 30 tablet, Refills: 0    sertraline (ZOLOFT) 25 MG tablet Take 1 tablet (25 mg total) by mouth at bedtime. Qty: 30 tablet, Refills: 1    tiotropium (SPIRIVA) 18 MCG inhalation capsule Place 1 capsule (18 mcg total) into inhaler and inhale daily. Qty: 30 capsule, Refills: 12    tiZANidine (ZANAFLEX) 2 MG tablet Take 2 mg by mouth at bedtime.      STOP taking these medications     albuterol (PROVENTIL HFA;VENTOLIN HFA) 108 (90 Base) MCG/ACT inhaler      albuterol (PROVENTIL) (2.5 MG/3ML) 0.083%  nebulizer solution      aspirin-sod bicarb-citric acid (ALKA-SELTZER) 325 MG TBEF tablet      polyethylene glycol (MIRALAX / GLYCOLAX) packet      diltiazem (CARDIZEM CD) 360 MG 24 hr capsule          Discharge Condition: Overall prognosis is guarded   Discharge Instructions Get Medicines reviewed and adjusted: Please take all your medications with you for your next visit with your Primary MD  Please request your Primary MD to go over all hospital tests and procedure/radiological results at the follow up, please ask your Primary MD to get all Hospital records sent to his/her office.  If you experience worsening of your admission symptoms, develop shortness of breath, life threatening emergency, suicidal or homicidal thoughts you must seek medical attention immediately by calling 911 or calling your MD immediately if symptoms less severe.  You must read complete instructions/literature along with all the possible adverse reactions/side effects for all the Medicines you take and that have been prescribed to you. Take any new Medicines after you have completely understood and accpet all the possible adverse reactions/side effects.   Do not drive when taking Pain medications.   Do not take more than prescribed Pain, Sleep and Anxiety Medications  Special Instructions: If you have smoked or chewed Tobacco in the last 2 yrs please stop smoking, stop any regular Alcohol and or any Recreational drug use.  Wear Seat belts while driving.  Please note  You were cared for by a hospitalist during your hospital stay. Once you are discharged, your primary care physician will handle any further medical issues. Please note that NO REFILLS for any discharge medications will be authorized once you are discharged, as it is imperative that you return to your primary care physician (or establish a relationship with a primary care physician if you do not have one) for your aftercare needs so that they  can reassess your need for medications and monitor your lab values.  Discharge Instructions    Diet - low sodium heart healthy    Complete by:  As directed    Increase activity slowly    Complete by:  As directed        No Known Allergies    Disposition: 03-Skilled Nursing Facility   Consults: *     Significant Diagnostic Studies:  Dg Chest 2 View  Result Date: 01/20/2016 CLINICAL DATA:  75 year old female with congestive heart failure EXAM: CHEST  2 VIEW COMPARISON:  Prior chest x-ray 01/17/2016 FINDINGS: Slightly increased bilateral layering pleural effusions and bibasilar atelectasis compared to prior. The right pleural effusion is larger than the left. Background of mild interstitial edema superimposed on emphysema and chronic bronchitic changes. Stable cardiomegaly. Atherosclerotic calcification present in the transverse aorta. Stable right apical pleural parenchymal  scarring. Remote right proximal humeral fracture again identified. No acute osseous abnormality. IMPRESSION: 1. Slightly increased right greater than left pleural effusions and associated bibasilar atelectasis. 2. Persistent mild interstitial pulmonary edema superimposed on a background of emphysema and chronic bronchitic changes. Emphysema. (ICD10-J43.9) 3.  Aortic Atherosclerosis (ICD10-170.0) Electronically Signed   By: Jacqulynn Cadet M.D.   On: 01/20/2016 09:47   Dg Chest 2 View  Result Date: 01/17/2016 CLINICAL DATA:  Dyspnea, onset today EXAM: CHEST  2 VIEW COMPARISON:  12/10/2015 FINDINGS: There are pleural effusions bilaterally. There is mild vascular and interstitial fullness. No focal airspace consolidation. Unchanged cardiomegaly. IMPRESSION: Findings may represent mild congestive heart failure. Bilateral pleural effusions. Electronically Signed   By: Andreas Newport M.D.   On: 01/17/2016 06:25       Filed Weights   01/20/16 0500 01/21/16 0453 01/22/16 0341  Weight: 60.3 kg (132 lb 15 oz) 61.1 kg  (134 lb 11.2 oz) 60.1 kg (132 lb 6.4 oz)     Microbiology: No results found for this or any previous visit (from the past 240 hour(s)).     Blood Culture    Component Value Date/Time   SDES URINE, RANDOM 12/11/2015 0652   SPECREQUEST NONE 12/11/2015 0652   CULT >=100,000 COLONIES/mL KLEBSIELLA PNEUMONIAE (A) 12/11/2015 0652   REPTSTATUS 12/13/2015 FINAL 12/11/2015 0652      Labs: Results for orders placed or performed during the hospital encounter of 01/17/16 (from the past 48 hour(s))  Potassium     Status: None   Collection Time: 01/20/16  3:45 PM  Result Value Ref Range   Potassium 4.3 3.5 - 5.1 mmol/L  Basic metabolic panel     Status: Abnormal   Collection Time: 01/21/16  4:42 AM  Result Value Ref Range   Sodium 143 135 - 145 mmol/L   Potassium 5.8 (H) 3.5 - 5.1 mmol/L    Comment: DELTA CHECK NOTED SLIGHT HEMOLYSIS    Chloride 93 (L) 101 - 111 mmol/L   CO2 41 (H) 22 - 32 mmol/L   Glucose, Bld 118 (H) 65 - 99 mg/dL   BUN 27 (H) 6 - 20 mg/dL   Creatinine, Ser 0.77 0.44 - 1.00 mg/dL   Calcium 8.6 (L) 8.9 - 10.3 mg/dL   GFR calc non Af Amer >60 >60 mL/min   GFR calc Af Amer >60 >60 mL/min    Comment: (NOTE) The eGFR has been calculated using the CKD EPI equation. This calculation has not been validated in all clinical situations. eGFR's persistently <60 mL/min signify possible Chronic Kidney Disease.    Anion gap 9 5 - 15  Comprehensive metabolic panel     Status: Abnormal   Collection Time: 01/22/16  3:29 AM  Result Value Ref Range   Sodium 140 135 - 145 mmol/L   Potassium 3.3 (L) 3.5 - 5.1 mmol/L    Comment: DELTA CHECK NOTED   Chloride 87 (L) 101 - 111 mmol/L   CO2 45 (H) 22 - 32 mmol/L   Glucose, Bld 83 65 - 99 mg/dL   BUN 26 (H) 6 - 20 mg/dL   Creatinine, Ser 0.63 0.44 - 1.00 mg/dL   Calcium 8.2 (L) 8.9 - 10.3 mg/dL   Total Protein 4.9 (L) 6.5 - 8.1 g/dL   Albumin 2.5 (L) 3.5 - 5.0 g/dL   AST 20 15 - 41 U/L   ALT 27 14 - 54 U/L   Alkaline  Phosphatase 49 38 - 126 U/L   Total Bilirubin 0.6 0.3 -  1.2 mg/dL   GFR calc non Af Amer >60 >60 mL/min   GFR calc Af Amer >60 >60 mL/min    Comment: (NOTE) The eGFR has been calculated using the CKD EPI equation. This calculation has not been validated in all clinical situations. eGFR's persistently <60 mL/min signify possible Chronic Kidney Disease.    Anion gap 8 5 - 15  CBC     Status: Abnormal   Collection Time: 01/22/16  3:29 AM  Result Value Ref Range   WBC 10.6 (H) 4.0 - 10.5 K/uL   RBC 4.73 3.87 - 5.11 MIL/uL   Hemoglobin 12.9 12.0 - 15.0 g/dL   HCT 42.9 36.0 - 46.0 %   MCV 90.7 78.0 - 100.0 fL   MCH 27.3 26.0 - 34.0 pg   MCHC 30.1 30.0 - 36.0 g/dL   RDW 18.2 (H) 11.5 - 15.5 %   Platelets 111 (L) 150 - 400 K/uL    Comment: REPEATED TO VERIFY PLATELET COUNT CONFIRMED BY SMEAR      Lipid Panel     Component Value Date/Time   CHOL 102 08/05/2013 0545   TRIG 66 08/05/2013 0545   HDL 47 08/05/2013 0545   CHOLHDL 2.2 08/05/2013 0545   VLDL 13 08/05/2013 0545   LDLCALC 42 08/05/2013 0545     No results found for: HGBA1C   Lab Results  Component Value Date   LDLCALC 42 08/05/2013   CREATININE 0.63 01/22/2016     HPI :*  [name] [name] is a 75 y.o. female  with significant past medical history of chronic respiratory failure requiring 3 L of nasal cannula at baseline COPD, proximal atrial fibrillation, chronic diastolic heart failure, history of right-sided pneumothorax 3 requiring tube thoracostomy in the past, her artery disease, hypertension, hyperlipidemia, depression and GERD presents with worsening shortness of breath the last 2 days.  Of note, she was recently hospitalized September 27 and discharge 12/16/2015 to skilled nursing facility, for which she was there until about 5 days ago when she was discharged to home. She lives at home alone. Her son lives in at a different town, but he stayed with her overnight and called EMS this morning because she wasn't  improving.  Patient denies any fevers or chills, denies chest pain.  Does note that she takes her Lasix not as prescribed because it causes her to urinate too often.  She admits her her legs are more swollen than normal today.  She is hungry and asking for food.  Per ER notes, she went to her PCP yesterday, and they increased her Lasix.  Patient reports compliance with other medications. Of note, she uses a walker at home but is having trouble ambulating secondary to worsening shortness of breath, with increased fatigue and weakness.   HOSPITAL COURSE:   Acute on chronic hypoxic and hypercapnic respiratory failure, on home O2 3 L [place] - Multifactorial. Suspect COPD exacerbation as well as acute on chronic diastolic congestive heart failure and atrial fibrillation with RVR - Treat underlying cause and monitor.  Continue diuretics, steroids  Atrial fibrillation with RVR, patient is followed by Dr. Einar Gip - CHADsVASC score 3, continue Pradaxa Telemetry shows bradycardia with heart rate between bradycardia and tachycardia - Continue digoxin, continue metoprolol per home medical regimen but may need to hold if SBP < 90 4 for heart rate less than 55 Initially required Cardizem infusion but has been off since midnight 01/18/16.  Restarted oral diltiazem 180 mg twice a day after discussing with  cardiology. Patient noted to have pauses of less than 2 seconds with bradycardia at rest Therefore dose of Cardizem changed to Cardizem CD 240 mg a day  Acute exacerbation of COPD Initially required IV Solu-Medrol  changed over to prednisone taper  , bronchodilators scheduled and as needed. Titrate to maintain oxygen saturations between 89-92 percent. Clinically improving. Since patient is improving without initiation of antibiotics, will not start antibiotics at this time.  Hyperkalemia - Appears to be slightly hemolyzed, patient on telemetry monitoring, potassium down from 6-3.3 Patient does receive one dose of  Kayexalate this admission  Acute on chronic diastolic CHF - 0/09/23 echo The estimated ejection fraction was in the range of 55% to 60% - Treating with furosemide increased to 60 mg IV twice a day,  chest x-ray 11/7  with no improvement of pleural effusion and moderate edema,  renal function stable - Follow-up chest x-ray 01/20/16.  Thrombocytopenia - Mild, possibly reactive - No signs of active bleeding - Stable. Follow CBC periodically.  Hyperlipidemia, mixed - continue lipitor  Essential hypertension  - controlled.  Depression - continued zoloft  Adult failure to thrive - Multifactorial from advanced age and multiple significant medical problems. - As per discussion with son, family is aware of her sustained decline and are hopeful that she may be able to go to SNF at discharge while they make arrangements for her to return home with hospice.  Abnormal TFTs  TSH 1.24, free T4 1 0.27. Clinically appears euthyroid. Recommend follow-up TFTs in 4-6 weeks.  Diarrhea Patient was receiving Colace and MiraLAX These have been discontinued       Discharge Exam:  Blood pressure (!) 114/57, pulse 68, temperature 97.4 F (36.3 C), temperature source Oral, resp. rate 15, weight 60.1 kg (132 lb 6.4 oz), SpO2 96 %.      Follow-up Information    HAMRICK,MAURA L, MD. Schedule an appointment as soon as possible for a visit.   Specialty:  Family Medicine Why:  Hospital follow-up Contact information: [place] [address] [phone]        Adrian Prows, MD. Schedule an appointment as soon as possible for a visit in 1 week(s).   Specialty:  Cardiology Why:  Called cardiology to make follow-up appointment for atrial fibrillation Contact information: [place] [address] [phone]           Signed: Reyne Dumas 01/22/2016, 11:26 AM        Time spent >45 mins

## 2016-01-22 NOTE — Patient Outreach (Signed)
[facility]astroenterology) Care Management  01/22/2016  [name] [name] 06/10/1940 [id number]  Assessment- [name] is currently covering for [facility] [name] [name] [name]. [name] received update from [place] Management Assistant to contact Denair with [facility] [employer] at [phone] because she has an authorization number. However, we do not know what this authorization number is for. [name] spoke with [name] [name] who stated that she has an authorization number to have patient go to a SNF. She reports that she was needing to talk to [name] [name]. Patient is currently at [facility] and recently was discharged from SNF 5 days before being admitted to the hospital. [name] contacted [name] [name] while on the phone with [name] [name] and [name] [name] states that inpatient will need to recommend SNF placement and place her. [name] informed [name] [name] that she is not an inpatient social worker and that she will need to contact inpatient [facility] Liaison for contact information on who she needs to talk to.   Plan-[name] will continue to provide assistance with this if needed.  [name] [name], BSW, MSW, [name] Phone: [phone] Fax: [phone]

## 2016-01-22 NOTE — Progress Notes (Signed)
Pt is being transported by [facility] at this time, I have called report to [place], LPN. I told them, they need to monitor pt potassium as it was elevated and now down to 3.3. All questions answered.  Ferdinand Lango, RN

## 2016-01-22 NOTE — Progress Notes (Signed)
Occupational Therapy Treatment Patient Details Name: [name] [name] MRN: [id number] DOB: June 22, 1940 Today's Date: 01/22/2016    History of present illness pt presents with acute on chronic respiratory failure.  pt with hx of COPD, A-fib, HF, pneumothorax x3, HTN, and Depression.     OT comments  Pt making progress with functional goals. OT will continue to follow  Follow Up Recommendations  SNF    Equipment Recommendations  None recommended by OT    Recommendations for Other Services      Precautions / Restrictions Precautions Precautions: Fall Precaution Comments: pt on 3L O2 at home. Restrictions Weight Bearing Restrictions: No       Mobility Bed Mobility Overal bed mobility: Needs Assistance Bed Mobility: Supine to Sit     Supine to sit: Min guard;HOB elevated        Transfers Overall transfer level: Needs assistance Equipment used: Rolling walker (2 wheeled) Transfers: Sit to/from Stand Sit to Stand: Min guard              Balance     Sitting balance-Leahy Scale: Fair       Standing balance-Leahy Scale: Poor                     ADL Overall ADL's : Needs assistance/impaired     Grooming: Wash/dry hands;Wash/dry face;Minimal assistance;Standing   Upper Body Bathing: Set up;Supervision/ safety;Sitting       Upper Body Dressing : Sitting;Supervision/safety;Set up       Toilet Transfer: Minimal assistance;BSC   Toileting- Clothing Manipulation and Hygiene: Sit to/from stand;Moderate assistance       Functional mobility during ADLs: Minimal assistance        Vision  no change from baseline                          Cognition   Behavior During Therapy: [facility] for tasks assessed/performed Overall Cognitive Status: Within Functional Limits for tasks assessed                       Extremity/Trunk Assessment   generalized weakness                        General Comments  Pt very pleasant and  cooperative    Pertinent Vitals/ Pain       Pain Assessment: No/denies pain                                                          Frequency  Min 2X/week        Progress Toward Goals  OT Goals(current goals can now be found in the care plan section)  Progress towards OT goals: Progressing toward goals     Plan Discharge plan remains appropriate                     End of Session Equipment Utilized During Treatment: Gait belt;Other (comment);Rolling walker [facility])   Activity Tolerance Patient limited by fatigue   Patient Left with call bell/phone within reach;in chair;with chair alarm set             Time: [id number] OT Time Calculation (min): 25 min  Charges: OT General Charges $OT Visit:  1 Procedure OT Treatments $Self Care/Home Management : 8-22 mins $Therapeutic Activity: 8-22 mins  Emmit Alexanders [facility] 01/22/2016, 1:09 PM

## 2016-01-26 ENCOUNTER — Encounter: Payer: Self-pay | Admitting: Internal Medicine

## 2016-01-26 ENCOUNTER — Non-Acute Institutional Stay (SKILLED_NURSING_FACILITY): Payer: Commercial Managed Care - HMO | Admitting: Internal Medicine

## 2016-01-26 DIAGNOSIS — I4891 Unspecified atrial fibrillation: Secondary | ICD-10-CM

## 2016-01-26 DIAGNOSIS — J441 Chronic obstructive pulmonary disease with (acute) exacerbation: Secondary | ICD-10-CM | POA: Diagnosis not present

## 2016-01-26 DIAGNOSIS — D72828 Other elevated white blood cell count: Secondary | ICD-10-CM

## 2016-01-26 DIAGNOSIS — Z049 Encounter for examination and observation for unspecified reason: Secondary | ICD-10-CM | POA: Diagnosis not present

## 2016-01-26 DIAGNOSIS — K5909 Other constipation: Secondary | ICD-10-CM

## 2016-01-26 DIAGNOSIS — R6889 Other general symptoms and signs: Secondary | ICD-10-CM

## 2016-01-26 DIAGNOSIS — I5032 Chronic diastolic (congestive) heart failure: Secondary | ICD-10-CM

## 2016-01-26 DIAGNOSIS — R531 Weakness: Secondary | ICD-10-CM | POA: Diagnosis not present

## 2016-01-26 DIAGNOSIS — J9621 Acute and chronic respiratory failure with hypoxia: Secondary | ICD-10-CM

## 2016-01-26 NOTE — Progress Notes (Signed)
LOCATION: [name]  PCP: Leonides Sake, MD   Code Status: DNR  Goals of care: Advanced Directive information Advanced Directives 01/26/2016  Does patient have an advance directive? Yes  Type of Advance Directive Out of facility DNR (pink MOST or yellow form)  Does patient want to make changes to advanced directive? No - Patient declined  Copy of advanced directive(s) in chart? Yes  Would patient like information on creating an advanced directive? -  Pre-existing out of facility DNR order (yellow form or pink MOST form) -       Extended Emergency Contact Information Primary Emergency Contact: [name] Address: [address]          [place], [place] [address] [name] [employer] Phone: [phone] Relation: Son Secondary Emergency Contact: [name] [place] Phone: [phone] Relation: Relative   No Known Allergies  Chief Complaint  Patient presents with  . New Admit To SNF    New Admission Visit     HPI:  Patient is a 75 y.o. female seen today for short term rehabilitation post hospital re-admission from [facility] of November 2017-01/22/2016 with acute on chronic hypoxic respiratory failure with COPD exacerbation and CHF exacerbation. She was placed on IV steroids and diuretics. She also had atrial fibrillation with RVR and was seen by cardiology. She required Cardizem infusion and was later switched to oral metoprolol and diltiazem. She is seen in her room today with her son and daughter-in-law present. She has medical history of coronary artery disease, atrial fibrillation, GERD, COPD, hypertension among others.  Review of Systems:  Constitutional: Negative for fever, chills, diaphoresis.  HENT: Negative for headache, congestion, nasal discharge. Positive for difficulty swallowing at times.   Eyes: Negative for blurred vision, double vision and discharge.  Respiratory: Negative for cough, shortness of breath and  wheezing.   Cardiovascular: Negative for chest pain, palpitations, leg swelling.  Gastrointestinal: Negative for heartburn, nausea, vomiting, abdominal pain. Last bowel movement was yesterday. Genitourinary: Positive for some burning and pain with urination.  Musculoskeletal: Negative for back pain, fall in the facility.  Skin: Negative for itching, rash.  Neurological: Negative for dizziness. Psychiatric/Behavioral: Negative for depression   Past Medical History:  Diagnosis Date  . Anemia   . Arthritis    "back" (11/21/2014)  . Asthma   . Blood dyscrasia    hx blood [place]  . Chronic bronchitis ([facility])   . Chronic kidney disease   . Chronic lower back pain   . 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 GERD (gastroesophageal reflux disease)   . [id number])    "a few times/year" (11/21/2014)  . History of blood transfusion X 2   "related to blood thinner I was taking"  . Hyperlipidemia   . Hypertension   . Migraine    "weekly probably" (11/21/2014)  . On home oxygen therapy    "3L; 24/7" (11/21/2014)  . Pneumonia    hx pneumonia   . Pneumothorax on right 09/2014  . Pulmonary embolism ([facility])   . Shortness of breath   . Urinary incontinence    Past Surgical History:  Procedure Laterality Date  . ABDOMINAL HYSTERECTOMY    . CATARACT EXTRACTION W/ INTRAOCULAR LENS  IMPLANT, BILATERAL Bilateral   . CYSTOSCOPY W/ RETROGRADES Bilateral 06/11/2014   Procedure: CYSTOSCOPY WITH RETROGRADE PYELOGRAM;  Surgeon: [name], MD;  Location: [facility];  Service: Urology;  Laterality: Bilateral;  . FRACTURE SURGERY    .  HARDWARE REMOVAL  01/29/2012   Procedure: HARDWARE REMOVAL;  Surgeon: [name], MD;  Location: [facility];  Service: Orthopedics;  Laterality: Right;  . HEMIARTHROPLASTY SHOULDER FRACTURE Right   . HIP ARTHROPLASTY  01/29/2012   Procedure: ARTHROPLASTY BIPOLAR HIP;  Surgeon: [name], MD;  Location: [facility];  Service: Orthopedics;  Laterality:  Right;  . LAPAROSCOPIC CHOLECYSTECTOMY    . ORIF WRIST FRACTURE  10/13/2011   Procedure: OPEN REDUCTION INTERNAL FIXATION (ORIF) WRIST FRACTURE;  Surgeon: [name], MD;  Location: [facility];  Service: Orthopedics;  Laterality: Right;  Open Reduction Internal Fixation Right Distal Radius   . TRANSURETHRAL RESECTION OF BLADDER TUMOR N/A 06/11/2014   Procedure: TRANSURETHRAL RESECTION OF BLADDER TUMOR (TURBT);  Surgeon: [name], MD;  Location: [facility];  Service: Urology;  Laterality: N/A;  . TUBAL LIGATION     Social History:   reports that she quit smoking about 1 years ago. Her smoking use included Cigarettes. She has a 56.00 pack-year smoking history. She has never used smokeless tobacco. She reports that she does not drink alcohol or use drugs.  Family History  Problem Relation Age of Onset  . Cancer - Lung Mother   . Coronary artery disease Mother   . Coronary artery disease Sister   . Coronary artery disease Brother     Medications:   Medication List       Accurate as of 01/26/16  2:44 PM. Always use your most recent med list.          alendronate 70 MG tablet Commonly known as:  FOSAMAX Take 70 mg by mouth once a week. Take with a full glass of water on an empty stomach.   atorvastatin 40 MG tablet Commonly known as:  LIPITOR Take 40 mg by mouth every morning.   AZO CRANBERRY PO Take 2 capsules by mouth daily.   calcitonin (salmon) 200 UNIT/ACT nasal spray Commonly known as:  MIACALCIN/FORTICAL Place 1 spray into alternate nostrils daily.   cyanocobalamin 500 MCG tablet Take 500 mcg by mouth daily.   dabigatran 150 MG Caps capsule Commonly known as:  PRADAXA Take 150 mg by mouth 2 (two) times daily.   digoxin 0.125 MG tablet Commonly known as:  LANOXIN Take 1 tablet (0.125 mg total) by mouth daily.   diltiazem 240 MG 24 hr tablet Commonly known as:  CARDIZEM LA Take 1 tablet (240 mg total) by mouth daily.   docusate sodium 100 MG capsule Commonly  known as:  COLACE Take 1 capsule (100 mg total) by mouth every 12 (twelve) hours.   ferrous sulfate 325 (65 FE) MG tablet Take 325 mg by mouth daily with breakfast.   Fish Oil 1000 MG Caps Take 1,000 mg by mouth daily.   fluticasone 50 MCG/ACT nasal spray Commonly known as:  FLONASE Place 1 spray into both nostrils daily as needed for allergies or rhinitis.   furosemide 40 MG tablet Commonly known as:  LASIX Take 1.5 tablets (60 mg total) by mouth daily.   guaiFENesin 600 MG 12 hr tablet Commonly known as:  MUCINEX Take 2 tablets (1,200 mg total) by mouth 2 (two) times daily.   HYDROcodone-acetaminophen 5-325 MG tablet Commonly known as:  NORCO/VICODIN Take 1 tablet by mouth every 4 (four) hours as needed.   levalbuterol 1.25 MG/3ML nebulizer solution Commonly known as:  XOPENEX Take 1.25 mg by nebulization every 4 (four) hours as needed for wheezing.   metoprolol 50 MG tablet Commonly known as:  LOPRESSOR Take 1 tablet (50 mg total) by mouth 2 (two) times daily.   oxybutynin 5 MG 24 hr tablet Commonly known as:  DITROPAN-XL Take 10 mg by mouth at bedtime.   pantoprazole 40 MG tablet Commonly known as:  PROTONIX Take 1 tablet (40 mg total) by mouth daily.   predniSONE 10 MG tablet Commonly known as:  DELTASONE 6 tablets 3 days, 5 tablets 3 days, 4 tablets 3 days, 3 tablets 3 days, 2 tablets 3 days, 1 tablet 3 days, half a tablet 3 days then DC   sertraline 25 MG tablet Commonly known as:  ZOLOFT Take 1 tablet (25 mg total) by mouth at bedtime.   tiotropium 18 MCG inhalation capsule Commonly known as:  SPIRIVA Place 1 capsule (18 mcg total) into inhaler and inhale daily.   tiZANidine 2 MG tablet Commonly known as:  ZANAFLEX Take 2 mg by mouth at bedtime.       Immunizations: Immunization History  Administered Date(s) Administered  . Influenza-Unspecified 11/13/2013  . PPD Test 12/16/2015     Physical Exam: Vitals:   01/26/16 1432  BP:  117/67  Pulse: 74  Resp: 17  Temp: 98.3 F (36.8 C)  TempSrc: Oral  SpO2: 98%  Weight: 132 lb 6.4 oz (60.1 kg)  Height: 5\' 1"  (1.549 m)   Body mass index is 25.02 kg/m.  General- elderly female, frail and chronically ill appearing, in no acute distress Head- normocephalic, atraumatic, hearing aids present Throat- moist mucus membrane, edentulous Eyes- PERRLA, EOMI, no pallor, no icterus, no discharge, normal conjunctiva, normal sclera Neck- no cervical lymphadenopathy  Cardiovascular- irregular heart rate,no murmur Respiratory- bilateral poor air movement , no wheeze, no rhonchi, no crackles, no use of accessory muscles, on oxygen by nasal cannula Abdomen- bowel sounds present, soft, non tender Musculoskeletal- able to move all 4 extremities, generalized weakness, arthritis changes to her finger, kyphosis present, trace leg edema  Neurological- alert and oriented to person, place and time Skin- warm and dry, bruises to both arms Psychiatry- normal mood and affect    Labs reviewed: Basic Metabolic Panel:  Recent Labs  01/17/16 0922  01/20/16 0641  01/20/16 1545 01/21/16 0442 01/22/16 0329  NA  --   < > 139  --   --  143 140  K  --   < > 6.3*  6.2*  < > 4.3 5.8* 3.3*  CL  --   < > 92*  --   --  93* 87*  CO2  --   < > 43*  --   --  41* 45*  GLUCOSE  --   < > 111*  --   --  118* 83  BUN  --   < > 33*  --   --  27* 26*  CREATININE  --   < > 0.75  --   --  0.77 0.63  CALCIUM  --   < > 9.1  --   --  8.6* 8.2*  MG 1.8  --   --   --   --   --   --   < > = values in this interval not displayed. Liver Function Tests:  Recent Labs  08/30/15 0825 12/10/15 1526 01/22/16 0329  AST 21 19 20   ALT 29 19 27   ALKPHOS 79 67 49  BILITOT 0.4 0.8 0.6  PROT 6.7 6.4* 4.9*  ALBUMIN 3.5 3.4* 2.5*    Recent Labs  08/30/15 0825  LIPASE 23   No  results for input(s): AMMONIA in the last 8760 hours. CBC:  Recent Labs  08/30/15 0825  12/10/15 1526  01/17/16 0555   01/19/16 0459 01/20/16 0641 01/22/16 0329  WBC 18.8*  < > 9.6  < > 8.7  < > 18.9* 19.1* 10.6*  NEUTROABS 14.8*  --  7.9*  --  6.9  --   --   --   --   HGB 13.4  < > 11.8*  < > 13.8  < > 12.1 13.6 12.9  HCT 43.7  < > 40.8  < > 47.9*  < > 42.4 45.7 42.9  MCV 88.5  < > 90.9  < > 93.7  < > 92.8 90.5 90.7  PLT 251  < > 142*  < > 140*  < > 128* 139* 111*  < > = values in this interval not displayed. Cardiac Enzymes:  Recent Labs  09/23/15 1300 12/10/15 1526  TROPONINI <0.03 <0.03   BNP: Invalid input(s): POCBNP CBG: No results for input(s): GLUCAP in the last 8760 hours.  Radiological Exams: Dg Chest 2 View  Result Date: 01/20/2016 CLINICAL DATA:  75 year old female with congestive heart failure EXAM: CHEST  2 VIEW COMPARISON:  Prior chest x-ray 01/17/2016 FINDINGS: Slightly increased bilateral layering pleural effusions and bibasilar atelectasis compared to prior. The right pleural effusion is larger than the left. Background of mild interstitial edema superimposed on emphysema and chronic bronchitic changes. Stable cardiomegaly. Atherosclerotic calcification present in the transverse aorta. Stable right apical pleural parenchymal scarring. Remote right proximal humeral fracture again identified. No acute osseous abnormality. IMPRESSION: 1. Slightly increased right greater than left pleural effusions and associated bibasilar atelectasis. 2. Persistent mild interstitial pulmonary edema superimposed on a background of emphysema and chronic bronchitic changes. Emphysema. (ICD10-J43.9) 3.  Aortic Atherosclerosis (ICD10-170.0) Electronically Signed   By: Jacqulynn Cadet M.D.   On: 01/20/2016 09:47   Dg Chest 2 View  Result Date: 01/17/2016 CLINICAL DATA:  Dyspnea, onset today EXAM: CHEST  2 VIEW COMPARISON:  12/10/2015 FINDINGS: There are pleural effusions bilaterally. There is mild vascular and interstitial fullness. No focal airspace consolidation. Unchanged cardiomegaly. IMPRESSION:  Findings may represent mild congestive heart failure. Bilateral pleural effusions. Electronically Signed   By: Andreas Newport M.D.   On: 01/17/2016 06:25    Assessment/Plan  Generalized weakness Will have her work with physical therapy and occupational therapy to help regain her strength and restorative balance.  Acute on chronic respiratory failure With COPD exacerbation and CHF exacerbation. Breathing is stable. Continue oxygen by cannula. Continue bronchodilators. Monitor her breathing.  COPD Exacerbation Continue her oxygen and bronchodilator treatment. Continue and complete her taper course of prednisone on 30th of November 2017.   Atrial fibrillation Controlled rate. Continue Lopressor 50 mg twice a day, diltiazem 240 mg daily Pradaxa 150 mg twice a day. Continue digoxin.  leukocytosis Currently on prednisone and this likely is contribution to it. Monitor WBC and temperature curve.  SDTI PU to right heel Keep legs elevated at rest and to apply foam dressing to heel with heel protectors  CHF Recent CHF exacerbation requiring IV diuresis. Continue Lasix 4060 mg daily along with Lopressor and monitor.  Chronic constipation continue Colace 100 mg twice a day    Goals of care: short term rehabilitation, possible LTC   Labs/tests ordered: cbc, bmp 01/27/16  Family/ staff Communication: reviewed care plan with patient and nursing supervisor    Blanchie Serve, MD Internal Medicine [facility] [address]  [place], [place] [address] Cell Phone (Monday-Friday 8 am - 5 pm): [phone] On Call: [phone] and follow prompts after 5 pm and on weekends Office Phone: [phone] Office Fax: [phone]

## 2016-01-28 DIAGNOSIS — I6523 Occlusion and stenosis of bilateral carotid arteries: Secondary | ICD-10-CM | POA: Diagnosis not present

## 2016-01-29 ENCOUNTER — Non-Acute Institutional Stay (SKILLED_NURSING_FACILITY): Payer: Commercial Managed Care - HMO | Admitting: Internal Medicine

## 2016-01-29 ENCOUNTER — Encounter: Payer: Self-pay | Admitting: Internal Medicine

## 2016-01-29 DIAGNOSIS — I5031 Acute diastolic (congestive) heart failure: Secondary | ICD-10-CM | POA: Diagnosis not present

## 2016-01-29 DIAGNOSIS — R0789 Other chest pain: Secondary | ICD-10-CM

## 2016-01-29 NOTE — Progress Notes (Signed)
LOCATION: [name]  PCP: Leonides Sake, MD   Code Status: DNR  Goals of care: Advanced Directive information Advanced Directives 01/26/2016  Does patient have an advance directive? Yes  Type of Advance Directive Out of facility DNR (pink MOST or yellow form)  Does patient want to make changes to advanced directive? No - Patient declined  Copy of advanced directive(s) in chart? Yes  Would patient like information on creating an advanced directive? -  Pre-existing out of facility DNR order (yellow form or pink MOST form) -       Extended Emergency Contact Information Primary Emergency Contact: [name],[name] Address: [address]          [place], [name] [address] [name] [employer] Phone: [phone] Relation: Son Secondary Emergency Contact: [name] [place] Phone: [phone] Relation: Relative   No Known Allergies  Chief Complaint  Patient presents with  . Acute Visit    Increased leg edema, lower chest and back pain     HPI:  Patient is a 75 y.o. female seen today for acute visit. She has noticed increased swelling to her legs. She denies any worsening of her dyspnea. She denies drainage from legs. She complaints of pain to left side of her lower chest and back with movement and deep breath. Denies any witnessed trauma or fall.   Review of Systems:  Constitutional: Negative for fever.  HENT: Negative for headache, congestion   Respiratory: Negative for shortness of breath and wheezing.  positive for occasional cough Cardiovascular: Negative for chest pain, palpitations  Gastrointestinal: Negative for heartburn, nausea, vomiting, abdominal pain Genitourinary: Negative for dysuria Musculoskeletal: Negative for fall in the facility.  Skin: Negative for itching, rash.  Neurological: Negative for dizziness   Past Medical History:  Diagnosis Date  . Anemia   . Arthritis    "back" (11/21/2014)  . Asthma   .  Blood dyscrasia    hx blood clotts  . Chronic bronchitis ([place])   . Chronic kidney disease   . Chronic lower back pain   . 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 GERD (gastroesophageal reflux disease)   . [id number]    "a few times/year" (11/21/2014)  . History of blood transfusion X 2   "related to blood thinner I was taking"  . Hyperlipidemia   . Hypertension   . Migraine    "weekly probably" (11/21/2014)  . On home oxygen therapy    "3L; 24/7" (11/21/2014)  . Pneumonia    hx pneumonia   . Pneumothorax on right 09/2014  . Pulmonary embolism ([place])   . Shortness of breath   . Urinary incontinence    Past Surgical History:  Procedure Laterality Date  . ABDOMINAL HYSTERECTOMY    . CATARACT EXTRACTION W/ INTRAOCULAR LENS  IMPLANT, BILATERAL Bilateral   . CYSTOSCOPY W/ RETROGRADES Bilateral 06/11/2014   Procedure: CYSTOSCOPY WITH RETROGRADE PYELOGRAM;  Surgeon: [name], MD;  Location: [facility];  Service: Urology;  Laterality: Bilateral;  . FRACTURE SURGERY    . HARDWARE REMOVAL  01/29/2012   Procedure: HARDWARE REMOVAL;  Surgeon: [name], MD;  Location: [facility];  Service: Orthopedics;  Laterality: Right;  . HEMIARTHROPLASTY SHOULDER FRACTURE Right   . HIP ARTHROPLASTY  01/29/2012   Procedure: ARTHROPLASTY BIPOLAR HIP;  Surgeon: [name], MD;  Location: [facility];  Service: Orthopedics;  Laterality: Right;  . LAPAROSCOPIC CHOLECYSTECTOMY    . ORIF WRIST  FRACTURE  10/13/2011   Procedure: OPEN REDUCTION INTERNAL FIXATION (ORIF) WRIST FRACTURE;  Surgeon: [name], MD;  Location: [facility];  Service: Orthopedics;  Laterality: Right;  Open Reduction Internal Fixation Right Distal Radius   . TRANSURETHRAL RESECTION OF BLADDER TUMOR N/A 06/11/2014   Procedure: TRANSURETHRAL RESECTION OF BLADDER TUMOR (TURBT);  Surgeon: [name], MD;  [facility];  Service: Urology;  Laterality: N/A;  . TUBAL LIGATION       Medications:   Medication List        Accurate as of 01/29/16 12:26 PM. Always use your most recent med list.          alendronate 70 MG tablet Commonly known as:  FOSAMAX Take 70 mg by mouth once a week. Take with a full glass of water on an empty stomach.   atorvastatin 40 MG tablet Commonly known as:  LIPITOR Take 40 mg by mouth every morning.   AZO CRANBERRY PO Take 2 capsules by mouth daily.   calcitonin (salmon) 200 UNIT/ACT nasal spray Commonly known as:  MIACALCIN/FORTICAL Place 1 spray into alternate nostrils daily.   cyanocobalamin 500 MCG tablet Take 500 mcg by mouth daily.   dabigatran 150 MG Caps capsule Commonly known as:  PRADAXA Take 150 mg by mouth 2 (two) times daily.   digoxin 0.125 MG tablet Commonly known as:  LANOXIN Take 1 tablet (0.125 mg total) by mouth daily.   diltiazem 240 MG 24 hr tablet Commonly known as:  CARDIZEM LA Take 1 tablet (240 mg total) by mouth daily.   docusate sodium 100 MG capsule Commonly known as:  COLACE Take 1 capsule (100 mg total) by mouth every 12 (twelve) hours.   feeding supplement (PRO-STAT SUGAR FREE 64) Liqd Take 30 mLs by mouth 2 (two) times daily.   ferrous sulfate 325 (65 FE) MG tablet Take 325 mg by mouth daily with breakfast.   Fish Oil 1000 MG Caps Take 1,000 mg by mouth daily.   fluticasone 50 MCG/ACT nasal spray Commonly known as:  FLONASE Place 1 spray into both nostrils daily as needed for allergies or rhinitis.   furosemide 40 MG tablet Commonly known as:  LASIX Take 1.5 tablets (60 mg total) by mouth daily.   guaiFENesin 600 MG 12 hr tablet Commonly known as:  MUCINEX Take 2 tablets (1,200 mg total) by mouth 2 (two) times daily.   HYDROcodone-acetaminophen 5-325 MG tablet Commonly known as:  NORCO/VICODIN Take 1 tablet by mouth every 4 (four) hours as needed.   levalbuterol 1.25 MG/3ML nebulizer solution Commonly known as:  XOPENEX Take 1.25 mg by nebulization every 4 (four) hours as needed for wheezing.     metoprolol 50 MG tablet Commonly known as:  LOPRESSOR Take 1 tablet (50 mg total) by mouth 2 (two) times daily.   multivitamin with minerals tablet Take 1 tablet by mouth daily.   oxybutynin 5 MG 24 hr tablet Commonly known as:  DITROPAN-XL Take 10 mg by mouth at bedtime.   pantoprazole 40 MG tablet Commonly known as:  PROTONIX Take 1 tablet (40 mg total) by mouth daily.   predniSONE 10 MG tablet Commonly known as:  DELTASONE 6 tablets 3 days, 5 tablets 3 days, 4 tablets 3 days, 3 tablets 3 days, 2 tablets 3 days, 1 tablet 3 days, half a tablet 3 days then DC   sertraline 25 MG tablet Commonly known as:  ZOLOFT Take 1 tablet (25 mg total) by mouth at bedtime.   tiotropium  18 MCG inhalation capsule Commonly known as:  SPIRIVA Place 1 capsule (18 mcg total) into inhaler and inhale daily.   tiZANidine 2 MG tablet Commonly known as:  ZANAFLEX Take 2 mg by mouth at bedtime.       Immunizations: Immunization History  Administered Date(s) Administered  . Influenza-Unspecified 11/13/2013  . PPD Test 12/16/2015     Physical Exam:  Vitals:   01/29/16 1218  BP: 127/69  Pulse: 96  Resp: 19  Temp: 98.7 F (37.1 C)  TempSrc: Oral  SpO2: 96%  Weight: 131 lb 6.4 oz (59.6 kg)  Height: 5\' 1"  (1.549 m)   Body mass index is 24.83 kg/m.  General- elderly female, frail, in no acute distress Head- normocephalic, atraumatic Nose- no nasal discharge Throat- moist mucus membrane Neck- no cervical lymphadenopathy Cardiovascular- normal s1,s2, no murmur Chest- reproducible chest pain to left lower rib cage  Respiratory- bilateral clear to auscultation, no wheeze, no rhonchi, no crackles, no use of accessory muscles Abdomen- bowel sounds present, soft, non tender Musculoskeletal- able to move all 4 extremities, generalized weakness, 1+ pitting leg edema Neurological- alert and oriented  Skin- warm and dry Psychiatry- normal mood and affect    Labs  reviewed: Basic Metabolic Panel:  Recent Labs  01/17/16 0922  01/20/16 0641  01/20/16 1545 01/21/16 0442 01/22/16 0329  NA  --   < > 139  --   --  143 140  K  --   < > 6.3*  6.2*  < > 4.3 5.8* 3.3*  CL  --   < > 92*  --   --  93* 87*  CO2  --   < > 43*  --   --  41* 45*  GLUCOSE  --   < > 111*  --   --  118* 83  BUN  --   < > 33*  --   --  27* 26*  CREATININE  --   < > 0.75  --   --  0.77 0.63  CALCIUM  --   < > 9.1  --   --  8.6* 8.2*  MG 1.8  --   --   --   --   --   --   < > = values in this interval not displayed. Liver Function Tests:  Recent Labs  08/30/15 0825 12/10/15 1526 01/22/16 0329  AST 21 19 20   ALT 29 19 27   ALKPHOS 79 67 49  BILITOT 0.4 0.8 0.6  PROT 6.7 6.4* 4.9*  ALBUMIN 3.5 3.4* 2.5*    Recent Labs  08/30/15 0825  LIPASE 23   No results for input(s): AMMONIA in the last 8760 hours. CBC:  Recent Labs  08/30/15 0825  12/10/15 1526  01/17/16 0555  01/19/16 0459 01/20/16 0641 01/22/16 0329  WBC 18.8*  < > 9.6  < > 8.7  < > 18.9* 19.1* 10.6*  NEUTROABS 14.8*  --  7.9*  --  6.9  --   --   --   --   HGB 13.4  < > 11.8*  < > 13.8  < > 12.1 13.6 12.9  HCT 43.7  < > 40.8  < > 47.9*  < > 42.4 45.7 42.9  MCV 88.5  < > 90.9  < > 93.7  < > 92.8 90.5 90.7  PLT 251  < > 142*  < > 140*  < > 128* 139* 111*  < > = values in this interval not displayed.  Cardiac Enzymes:  Recent Labs  09/23/15 1300 12/10/15 1526  TROPONINI <0.03 <0.03   BNP: Invalid input(s): POCBNP CBG: No results for input(s): GLUCAP in the last 8760 hours.  Radiological Exams: Dg Chest 2 View  Result Date: 01/20/2016 CLINICAL DATA:  75 year old female with congestive heart failure EXAM: CHEST  2 VIEW COMPARISON:  Prior chest x-ray 01/17/2016 FINDINGS: Slightly increased bilateral layering pleural effusions and bibasilar atelectasis compared to prior. The right pleural effusion is larger than the left. Background of mild interstitial edema superimposed on emphysema and  chronic bronchitic changes. Stable cardiomegaly. Atherosclerotic calcification present in the transverse aorta. Stable right apical pleural parenchymal scarring. Remote right proximal humeral fracture again identified. No acute osseous abnormality. IMPRESSION: 1. Slightly increased right greater than left pleural effusions and associated bibasilar atelectasis. 2. Persistent mild interstitial pulmonary edema superimposed on a background of emphysema and chronic bronchitic changes. Emphysema. (ICD10-J43.9) 3.  Aortic Atherosclerosis (ICD10-170.0) Electronically Signed   By: Jacqulynn Cadet M.D.   On: 01/20/2016 09:47   Dg Chest 2 View  Result Date: 01/17/2016 CLINICAL DATA:  Dyspnea, onset today EXAM: CHEST  2 VIEW COMPARISON:  12/10/2015 FINDINGS: There are pleural effusions bilaterally. There is mild vascular and interstitial fullness. No focal airspace consolidation. Unchanged cardiomegaly. IMPRESSION: Findings may represent mild congestive heart failure. Bilateral pleural effusions. Electronically Signed   By: Andreas Newport M.D.   On: 01/17/2016 06:25     Assessment/Plan  Acute diastolic chf Weight [id number] on 01/24/16 and 129 on admission. No weight for today. Currently on lasix 60 mg daily and lopressor 50 mg bid. Change her lasix to 40 mg bid. Keep legs elevated at rest. Add kcl 20 meq po daily. Check bmp on 02/02/16. Monitor weight 3 days a week. Add her on ReDs vest protocol to monitor for early signs of chf exacerbation. Keep legs elevated at rest  Musculoskeletal pain With Left sided chest and back pain on movement and deep breath. Producible chest pain on exam. Likely musculoskeletal. Currently on zanaflex at bedtime and norco 5-325 mg q4h prn pain. Add tylenol 500 mg tid x 1 week and monitor. Get cxr to evaluate further.   Family/ staff Communication: reviewed care plan with patient and nursing supervisor    Blanchie Serve, MD Internal Medicine [place], [place] [address] Cell Phone (Monday-Friday 8 am - 5 pm): [phone] On Call: [phone] and follow prompts after 5 pm and on weekends Office Phone: [phone] Office Fax: [phone]

## 2016-01-30 ENCOUNTER — Ambulatory Visit: Payer: Self-pay | Admitting: *Deleted

## 2016-01-30 DIAGNOSIS — J432 Centrilobular emphysema: Secondary | ICD-10-CM | POA: Diagnosis not present

## 2016-01-30 DIAGNOSIS — R0609 Other forms of dyspnea: Secondary | ICD-10-CM | POA: Diagnosis not present

## 2016-01-30 DIAGNOSIS — I6523 Occlusion and stenosis of bilateral carotid arteries: Secondary | ICD-10-CM | POA: Diagnosis not present

## 2016-01-30 DIAGNOSIS — I480 Paroxysmal atrial fibrillation: Secondary | ICD-10-CM | POA: Diagnosis not present

## 2016-02-02 LAB — CBC AND DIFFERENTIAL
HEMATOCRIT: 43 % (ref 36–46)
Hemoglobin: 12.8 g/dL (ref 12.0–16.0)
Platelets: 141 10*3/uL — AB (ref 150–399)
WBC: 12.2 10^3/mL

## 2016-02-02 LAB — BASIC METABOLIC PANEL
BUN: 20 mg/dL (ref 4–21)
CREATININE: 0.6 mg/dL (ref 0.5–1.1)
Glucose: 124 mg/dL
Potassium: 4 mmol/L (ref 3.4–5.3)
SODIUM: 144 mmol/L (ref 137–147)

## 2016-02-05 IMAGING — CR DG CHEST 2V
1 series · 1 of 1 positions shown · non-contrast
Comparison: 08/03/2013 and 07/08/2013

CLINICAL DATA: Shortness of breath. Intermittent chest pressure.
COPD.

EXAM:
CHEST  2 VIEW

[w chest lat]
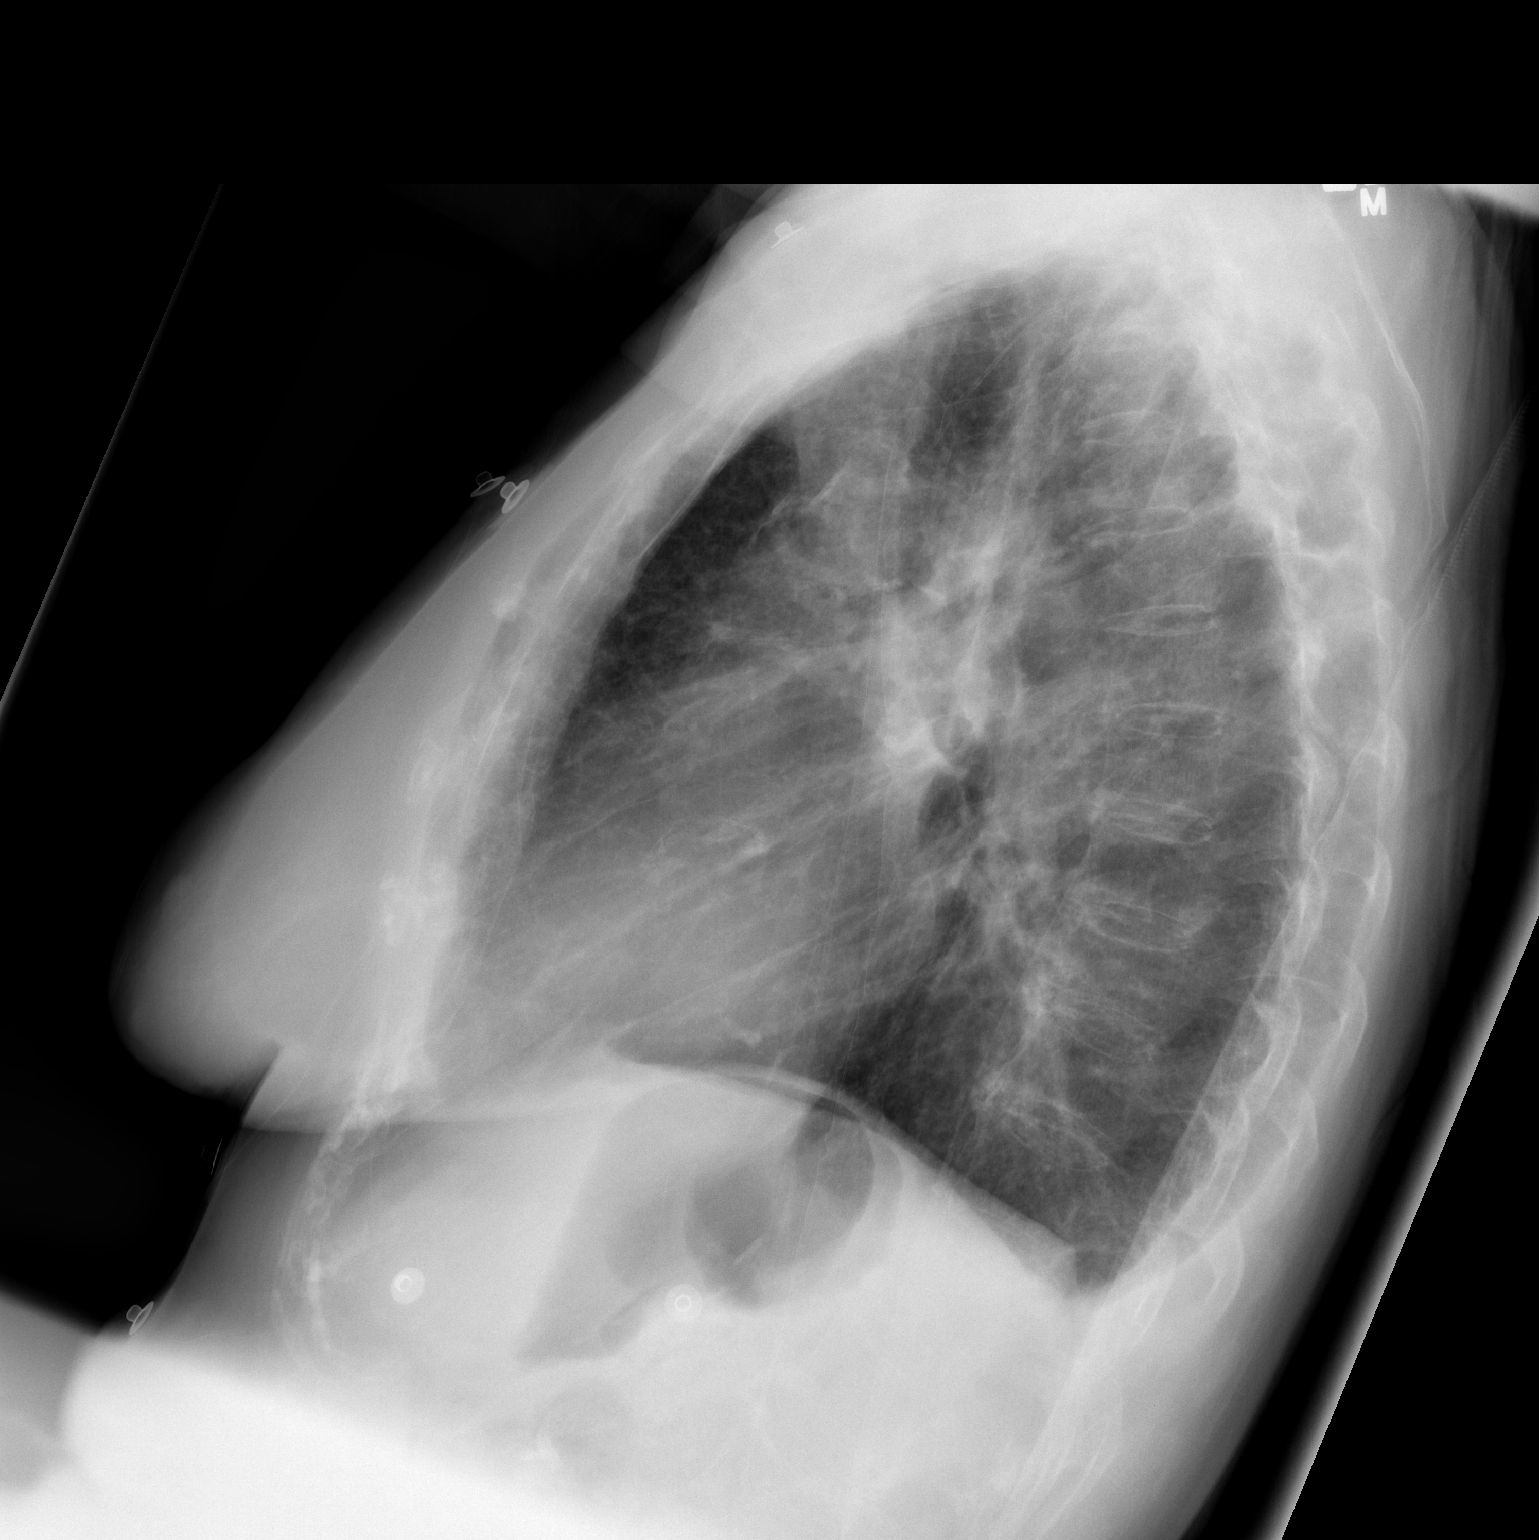

[1 of 1 positions shown; findings below may reference images not displayed]

FINDINGS: There are no acute infiltrates. Heart size and vascularity are
normal. There is superior retraction of the hila due to scarring in
both upper lobes. Postsurgical changes in the right apex.

The patient does have tiny new bilateral pleural effusions. No acute
osseous abnormality.
IMPRESSION: New tiny bilateral pleural effusions superimposed on chronic lung
disease.

## 2016-02-07 IMAGING — CR DG CHEST 2V
2 series · 2 of 2 positions shown · non-contrast
Comparison: 09/24/2013

CLINICAL DATA: Chest pain, shortness of breath

EXAM:
CHEST  2 VIEW

[w chest pa]
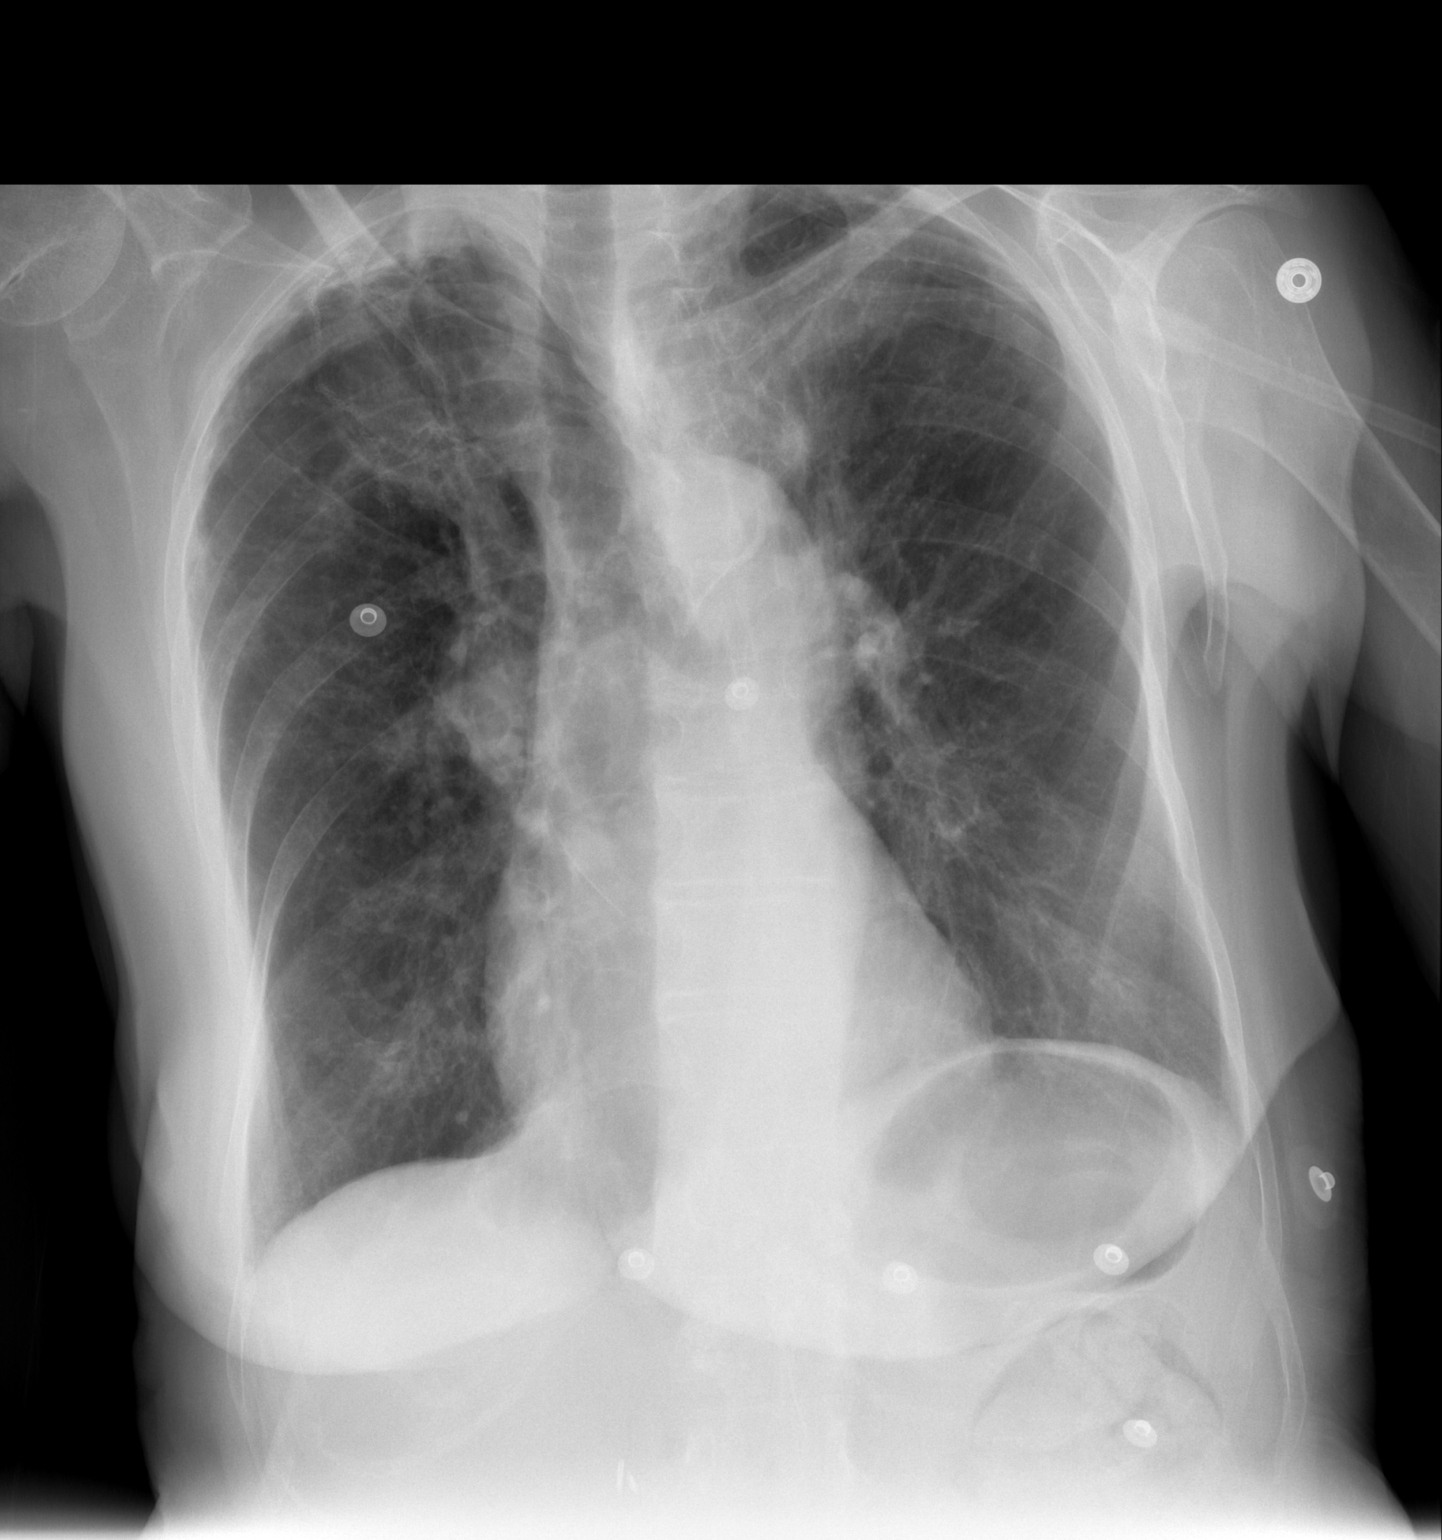

[w chest lat]
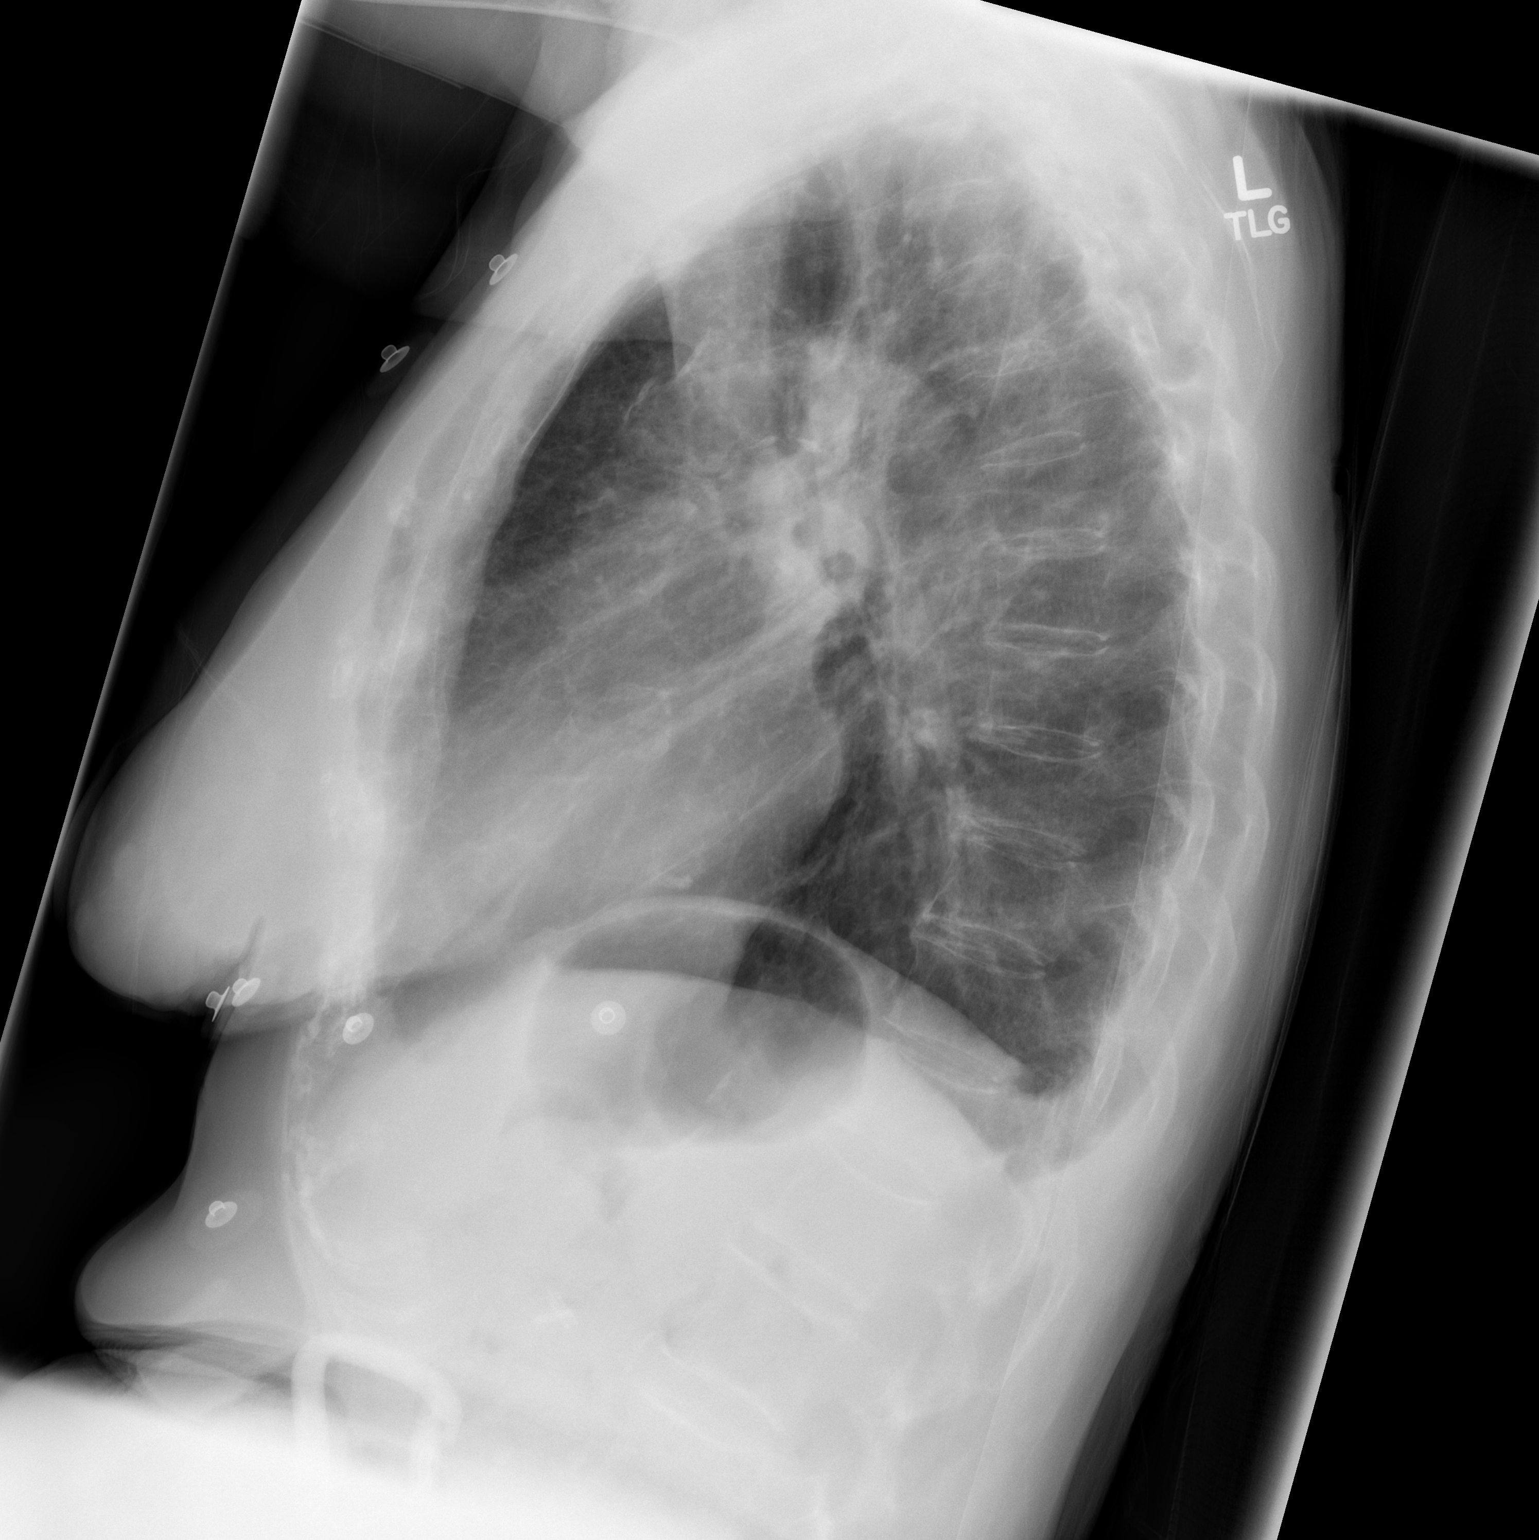

[2 of 2 positions shown; findings below may reference images not displayed]

FINDINGS: Posttreatment changes in the right upper lobe with upward retraction
of the right hilum. No new focal parenchymal opacity. The lungs are
hyperinflated likely secondary to COPD. No pleural effusion or
pneumothorax. Stable cardiomediastinal silhouette. Unremarkable
osseous structures.
IMPRESSION: No active cardiopulmonary disease.

## 2016-02-16 ENCOUNTER — Non-Acute Institutional Stay (SKILLED_NURSING_FACILITY): Payer: Commercial Managed Care - HMO | Admitting: Family

## 2016-02-16 DIAGNOSIS — I5032 Chronic diastolic (congestive) heart failure: Secondary | ICD-10-CM

## 2016-02-16 MED ORDER — FUROSEMIDE 40 MG PO TABS: 40.0000 mg | ORAL_TABLET | Freq: Every day | ORAL | 1 refills | Status: DC

## 2016-02-16 NOTE — Progress Notes (Signed)
Location:  [facility] Room Number: [place] of Service:  SNF (31) Provider: Ceniyah Thorp FNP-C   Leonides Sake, MD  Patient Care Team: Leonides Sake, MD as PCP - General (Family Medicine) Alfonzo Feller, RN as [place], LCSW as Social Worker Deirdre Peer, LCSW as Social Worker  Extended Emergency Contact Information Primary Emergency Contact: [place],[name] Address: [address]          [place], [place] [address] [name] [employer] Phone: [phone] Relation: Son Secondary Emergency Contact: [name] [place] Phone: [phone] Relation: Relative  Code Status:  DNR  Goals of care: Advanced Directive information Advanced Directives 01/26/2016  Does Patient Have a Medical Advance Directive? Yes  Type of Advance Directive Out of facility DNR (pink MOST or yellow form)  Does patient want to make changes to medical advance directive? No - Patient declined  Copy of [place] in Chart? Yes  Would patient like information on creating a medical advance directive? -  Pre-existing out of facility DNR order (yellow form or pink MOST form) -     Chief Complaint  Patient presents with  . Acute Visit    HPI:  Pt is a 75 y.o. female seen today at [facility] for an acute visit for evaluation of abnormal REDS reading. She has a significant medical history of CHF. She is seen in her room today. Her REDS reading decreased to 17 this visit.She denies any shortness of breath or wheezing. She states her edema has improved. She has had weight loss.    Past Medical History:  Diagnosis Date  . Anemia   . Arthritis    "back" (11/21/2014)  . Asthma   . Blood dyscrasia    hx blood clotts  . Chronic bronchitis ([place])   . Chronic kidney disease   . Chronic lower back pain   . COPD (chronic obstructive pulmonary  disease) (HCC)    emphysema    . GERD (gastroesophageal reflux disease)   . [id number]    "a few times/year" (11/21/2014)  . History of blood transfusion X 2   "related to blood thinner I was taking"  . Hyperlipidemia   . Hypertension   . Migraine    "weekly probably" (11/21/2014)  . On home oxygen therapy    "3L; 24/7" (11/21/2014)  . Pneumonia    hx pneumonia   . Pneumothorax on right 09/2014  . Pulmonary embolism ([place])   . Shortness of breath   . Urinary incontinence    Past Surgical History:  Procedure Laterality Date  . ABDOMINAL HYSTERECTOMY    . CATARACT EXTRACTION W/ INTRAOCULAR LENS  IMPLANT, BILATERAL Bilateral   . CYSTOSCOPY W/ RETROGRADES Bilateral 06/11/2014   Procedure: CYSTOSCOPY WITH RETROGRADE PYELOGRAM;  Surgeon: [name], MD;  [facility];  Service: Urology;  Laterality: Bilateral;  . FRACTURE SURGERY    . HARDWARE REMOVAL  01/29/2012   Procedure: HARDWARE REMOVAL;  Surgeon: [name], MD;  Location: [facility];  Service: Orthopedics;  Laterality: Right;  . HEMIARTHROPLASTY SHOULDER FRACTURE Right   . HIP ARTHROPLASTY  01/29/2012   Procedure: ARTHROPLASTY BIPOLAR HIP;  Surgeon: [name], MD;  Location: [facility];  Service: Orthopedics;  Laterality: Right;  . LAPAROSCOPIC CHOLECYSTECTOMY    . ORIF WRIST FRACTURE  10/13/2011   Procedure: OPEN REDUCTION INTERNAL FIXATION (ORIF) WRIST FRACTURE;  Surgeon: [name], MD;  Location: [facility];  Service: Orthopedics;  Laterality: Right;  Open Reduction Internal Fixation Right Distal Radius   . TRANSURETHRAL RESECTION OF BLADDER TUMOR N/A 06/11/2014   Procedure: TRANSURETHRAL RESECTION OF BLADDER TUMOR (TURBT);  Surgeon: [name], MD;  [facility];  Service: Urology;  Laterality: N/A;  . TUBAL LIGATION      No Known Allergies    Medication List       Accurate as of 02/16/16  6:06 PM. Always use your most recent med list.          alendronate 70 MG tablet Commonly known as:  FOSAMAX Take 70  mg by mouth once a week. Take with a full glass of water on an empty stomach.   atorvastatin 40 MG tablet Commonly known as:  LIPITOR Take 40 mg by mouth every morning.   AZO CRANBERRY PO Take 2 capsules by mouth daily.   calcitonin (salmon) 200 UNIT/ACT nasal spray Commonly known as:  MIACALCIN/FORTICAL Place 1 spray into alternate nostrils daily.   cyanocobalamin 500 MCG tablet Take 500 mcg by mouth daily.   dabigatran 150 MG Caps capsule Commonly known as:  PRADAXA Take 150 mg by mouth 2 (two) times daily.   digoxin 0.125 MG tablet Commonly known as:  LANOXIN Take 1 tablet (0.125 mg total) by mouth daily.   diltiazem 240 MG 24 hr tablet Commonly known as:  CARDIZEM LA Take 1 tablet (240 mg total) by mouth daily.   docusate sodium 100 MG capsule Commonly known as:  COLACE Take 1 capsule (100 mg total) by mouth every 12 (twelve) hours.   feeding supplement (PRO-STAT SUGAR FREE 64) Liqd Take 30 mLs by mouth 2 (two) times daily.   ferrous sulfate 325 (65 FE) MG tablet Take 325 mg by mouth daily with breakfast.   Fish Oil 1000 MG Caps Take 1,000 mg by mouth daily.   fluticasone 50 MCG/ACT nasal spray Commonly known as:  FLONASE Place 1 spray into both nostrils daily as needed for allergies or rhinitis.   furosemide 40 MG tablet Commonly known as:  LASIX Take 1 tablet (40 mg total) by mouth daily.   guaiFENesin 600 MG 12 hr tablet Commonly known as:  MUCINEX Take 2 tablets (1,200 mg total) by mouth 2 (two) times daily.   HYDROcodone-acetaminophen 5-325 MG tablet Commonly known as:  NORCO/VICODIN Take 1 tablet by mouth every 4 (four) hours as needed.   levalbuterol 1.25 MG/3ML nebulizer solution Commonly known as:  XOPENEX Take 1.25 mg by nebulization every 4 (four) hours as needed for wheezing.   metoprolol 50 MG tablet Commonly known as:  LOPRESSOR Take 1 tablet (50 mg total) by mouth 2 (two) times daily.   multivitamin with minerals tablet Take 1  tablet by mouth daily.   oxybutynin 5 MG 24 hr tablet Commonly known as:  DITROPAN-XL Take 10 mg by mouth at bedtime.   pantoprazole 40 MG tablet Commonly known as:  PROTONIX Take 1 tablet (40 mg total) by mouth daily.   predniSONE 10 MG tablet Commonly known as:  DELTASONE 6 tablets 3 days, 5 tablets 3 days, 4 tablets 3 days, 3 tablets 3 days, 2 tablets 3 days, 1 tablet 3 days, half a tablet 3 days then DC   sertraline 25 MG tablet Commonly known as:  ZOLOFT Take 1 tablet (25 mg total) by mouth at bedtime.   tiotropium 18 MCG inhalation capsule Commonly known as:  SPIRIVA Place 1 capsule (  18 mcg total) into inhaler and inhale daily.   tiZANidine 2 MG tablet Commonly known as:  ZANAFLEX Take 2 mg by mouth at bedtime.       Review of Systems  Constitutional: Negative for appetite change, chills and fever.  HENT: Negative for congestion, rhinorrhea, sinus pressure, sneezing and sore throat.   Eyes: Negative.   Respiratory: Negative for cough, chest tightness, shortness of breath and wheezing.        Oxygen via [place]   Cardiovascular: Negative for chest pain, palpitations and leg swelling.  Gastrointestinal: Negative for abdominal distention, abdominal pain, constipation and diarrhea.  Endocrine: Negative for cold intolerance and heat intolerance.  Genitourinary: Negative for dysuria, frequency and urgency.  Musculoskeletal: Positive for gait problem.  Skin: Negative for pallor and rash.  Neurological: Negative for dizziness, seizures, syncope, light-headedness and headaches.  Psychiatric/Behavioral: Negative for agitation, confusion, hallucinations and sleep disturbance. The patient is not nervous/anxious.     Immunization History  Administered Date(s) Administered  . Influenza-Unspecified 11/13/2013  . PPD Test 12/16/2015   Pertinent  Health Maintenance Due  Topic Date Due  . COLONOSCOPY  06/09/1991  . PNA vac Low Risk Adult (1 of 2 - PCV13) 06/09/2006  .  INFLUENZA VACCINE  10/14/2015  . MAMMOGRAM  11/19/2017  . DEXA SCAN  Completed   Fall Risk  12/31/2015 05/22/2015 02/20/2015 11/19/2014 10/07/2014  Falls in the past year? No No No No No  Risk for fall due to : Impaired mobility History of fall(s) Impaired balance/gait;History of fall(s) Impaired balance/gait;History of fall(s);Impaired mobility History of fall(s);Impaired balance/gait  Risk for fall due to (comments): - - - - -      Vitals:   02/16/16 1130  BP: 131/69  Pulse: 78  Resp: 20  Temp: 97.8 F (36.6 C)  SpO2: 93%  Weight: 116 lb 9.6 oz (52.9 kg)  Height: 5\' 1"  (1.549 m)   Body mass index is 22.03 kg/m. Physical Exam  Constitutional: She is oriented to person, place, and time. She appears well-developed and well-nourished. No distress.  HENT:  Head: Normocephalic.  Mouth/Throat: Oropharynx is clear and moist. No oropharyngeal exudate.  Eyes: Conjunctivae and EOM are normal. Pupils are equal, round, and reactive to light. Right eye exhibits no discharge. Left eye exhibits no discharge. No scleral icterus.  Neck: Normal range of motion. No JVD present. No thyromegaly present.  Cardiovascular: Normal rate, regular rhythm, normal heart sounds and intact distal pulses.  Exam reveals no gallop and no friction rub.   No murmur heard. Pulmonary/Chest: Effort normal and breath sounds normal. No respiratory distress. She has no wheezes. She has no rales.  Oxygen 2 Liters via [place]   Abdominal: Soft. Bowel sounds are normal. She exhibits no distension. There is no tenderness. There is no rebound and no guarding.  Musculoskeletal: She exhibits no tenderness.  Unsteady gait.Pitting edema to bilateral lower extremities.   Lymphadenopathy:    She has no cervical adenopathy.  Neurological: She is oriented to person, place, and time.  Skin: Skin is warm and dry. No erythema. No pallor.  Psychiatric: She has a normal mood and affect.    Labs reviewed:  Recent Labs  01/17/16 0922   01/20/16 0641  01/21/16 0442 01/22/16 0329 02/02/16  NA  --   < > 139  --  143 140 144  K  --   < > 6.3*  6.2*  < > 5.8* 3.3* 4.0  CL  --   < > 92*  --  93* 87*  --   CO2  --   < > 43*  --  41* 45*  --   GLUCOSE  --   < > 111*  --  118* 83  --   BUN  --   < > 33*  --  27* 26* 20  CREATININE  --   < > 0.75  --  0.77 0.63 0.6  CALCIUM  --   < > 9.1  --  8.6* 8.2*  --   MG 1.8  --   --   --   --   --   --   < > = values in this interval not displayed.  Recent Labs  08/30/15 0825 12/10/15 1526 01/22/16 0329  AST 21 19 20   ALT 29 19 27   ALKPHOS 79 67 49  BILITOT 0.4 0.8 0.6  PROT 6.7 6.4* 4.9*  ALBUMIN 3.5 3.4* 2.5*    Recent Labs  08/30/15 0825  12/10/15 1526  01/17/16 0555  01/19/16 0459 01/20/16 0641 01/22/16 0329 02/02/16  WBC 18.8*  < > 9.6  < > 8.7  < > 18.9* 19.1* 10.6* 12.2  NEUTROABS 14.8*  --  7.9*  --  6.9  --   --   --   --   --   HGB 13.4  < > 11.8*  < > 13.8  < > 12.1 13.6 12.9 12.8  HCT 43.7  < > 40.8  < > 47.9*  < > 42.4 45.7 42.9 43  MCV 88.5  < > 90.9  < > 93.7  < > 92.8 90.5 90.7  --   PLT 251  < > 142*  < > 140*  < > 128* 139* 111* 141*  < > = values in this interval not displayed. Lab Results  Component Value Date   TSH 1.249 01/17/2016   No results found for: HGBA1C Lab Results  Component Value Date   CHOL 102 08/05/2013   HDL 47 08/05/2013   LDLCALC 42 08/05/2013   TRIG 66 08/05/2013   CHOLHDL 2.2 08/05/2013   Assessment/Plan  Chronic diastolic congestive heart failure  REDs reading down to 17. She has had weight loss. Her edema has improved. Exam findings without any shortness of breath, wheezing or dyspnea. Will     Family/ staff Communication: Reviewed plan of care with patient and facility Nurse supervisor.  Labs/tests ordered: BMP 02/17/2016

## 2016-02-19 ENCOUNTER — Other Ambulatory Visit: Payer: Self-pay | Admitting: Internal Medicine

## 2016-02-20 ENCOUNTER — Encounter: Payer: Self-pay | Admitting: *Deleted

## 2016-02-20 ENCOUNTER — Other Ambulatory Visit: Payer: Self-pay | Admitting: *Deleted

## 2016-02-20 NOTE — Patient Outreach (Signed)
[facility]) Care Management  02/20/2016  [name] [name] [birth year] [id number]  Received message from Eduard Clos, LCSW that patient plans to remain in SNF long term.  Plan Will close to  care management services.  Will notify CMA Case closure notification to MD .  Patient closure letter has been sent by LCSW.  Joylene Draft, RN, [facility] [phone]- Mobile [phone]- Toll Free Main Office

## 2016-02-20 NOTE — Patient Outreach (Signed)
[facility]) Care Management  02/20/2016  [name] [name] Jun 27, 1940 [id number]   [name] spoke with Isaias Cowman SNF rep who reports plans for patient to remain at SNF in long term care. [name] [name] will close case at this time and advise [facility] RNCM and PCP.   Eduard Clos, MSW, [place] Worker  [facility] [phone]

## 2016-02-23 ENCOUNTER — Non-Acute Institutional Stay (SKILLED_NURSING_FACILITY): Payer: Commercial Managed Care - HMO | Admitting: Family

## 2016-02-23 DIAGNOSIS — J449 Chronic obstructive pulmonary disease, unspecified: Secondary | ICD-10-CM

## 2016-02-23 DIAGNOSIS — M150 Primary generalized (osteo)arthritis: Secondary | ICD-10-CM | POA: Diagnosis not present

## 2016-02-23 DIAGNOSIS — I10 Essential (primary) hypertension: Secondary | ICD-10-CM | POA: Diagnosis not present

## 2016-02-23 DIAGNOSIS — I480 Paroxysmal atrial fibrillation: Secondary | ICD-10-CM

## 2016-02-23 DIAGNOSIS — I5032 Chronic diastolic (congestive) heart failure: Secondary | ICD-10-CM | POA: Diagnosis not present

## 2016-02-23 DIAGNOSIS — M159 Polyosteoarthritis, unspecified: Secondary | ICD-10-CM

## 2016-02-23 DIAGNOSIS — F329 Major depressive disorder, single episode, unspecified: Secondary | ICD-10-CM | POA: Diagnosis not present

## 2016-02-23 DIAGNOSIS — F32A Depression, unspecified: Secondary | ICD-10-CM

## 2016-02-23 NOTE — Progress Notes (Signed)
Location:  [facility] Room Number: 1106 A Place of Service:  SNF (31) Provider:  Vallie Fayette FNP-C   Leonides Sake, MD  Patient Care Team: Leonides Sake, MD as PCP - General (Family Medicine)  Extended Emergency Contact Information Primary Emergency Contact: Leonard,[name] Address: [address]          [place], [place] [address] [name] Mobile Phone: [phone] Relation: Son Secondary Emergency Contact: [name] [place] Mobile Phone: [phone] Relation: Relative  Code Status: DNR  Goals of care: Advanced Directive information Advanced Directives 01/26/2016  Does Patient Have a Medical Advance Directive? Yes  Type of Advance Directive Out of facility DNR (pink MOST or yellow form)  Does patient want to make changes to medical advance directive? No - Patient declined  Copy of [place] in Chart? Yes  Would patient like information on creating a medical advance directive? -  Pre-existing out of facility DNR order (yellow form or pink MOST form) -     Chief Complaint  Patient presents with  . Medical Management of Chronic Issues    HPI:  Pt is a 75 y.o. female seen today at [facility] for medical management of chronic diseases. She has a medical history of COPD, CHF, HTN, Afib, Depression,GERD among other conditions. She is seen in her room this visit. She denies any acute issues this visit. She has had no recent weight gain, fall or hospital admission. Facility Nurse reports no new concerns.    Past Medical History:  Diagnosis Date  . Anemia   . Arthritis    "back" (11/21/2014)  . Asthma   . Blood dyscrasia    hx blood clotts  . Chronic bronchitis ([place])   . Chronic kidney disease   . Chronic lower back pain   . 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 GERD (gastroesophageal reflux disease)   . [id number])    "a few  times/year" (11/21/2014)  . History of blood transfusion X 2   "related to blood thinner I was taking"  . Hyperlipidemia   . Hypertension   . Migraine    "weekly probably" (11/21/2014)  . On home oxygen therapy    "3L; 24/7" (11/21/2014)  . Pneumonia    hx pneumonia   . Pneumothorax on right 09/2014  . Pulmonary embolism ([place])   . Shortness of breath   . Urinary incontinence    Past Surgical History:  Procedure Laterality Date  . ABDOMINAL HYSTERECTOMY    . CATARACT EXTRACTION W/ INTRAOCULAR LENS  IMPLANT, BILATERAL Bilateral   . CYSTOSCOPY W/ RETROGRADES Bilateral 06/11/2014   Procedure: CYSTOSCOPY WITH RETROGRADE PYELOGRAM;  Surgeon: [name], MD;  Location: [facility];  Service: Urology;  Laterality: Bilateral;  . FRACTURE SURGERY    . HARDWARE REMOVAL  01/29/2012   Procedure: HARDWARE REMOVAL;  Surgeon: [name], MD;  Location: [facility];  Service: Orthopedics;  Laterality: Right;  . HEMIARTHROPLASTY SHOULDER FRACTURE Right   . HIP ARTHROPLASTY  01/29/2012   Procedure: ARTHROPLASTY BIPOLAR HIP;  Surgeon: [name], MD;  Location: [facility];  Service: Orthopedics;  Laterality: Right;  . LAPAROSCOPIC CHOLECYSTECTOMY    . ORIF WRIST FRACTURE  10/13/2011   Procedure: OPEN REDUCTION INTERNAL FIXATION (ORIF) WRIST FRACTURE;  Surgeon: [name], MD;  Location: [facility];  Service: Orthopedics;  Laterality: Right;  Open Reduction Internal Fixation Right  Distal Radius   . TRANSURETHRAL RESECTION OF BLADDER TUMOR N/A 06/11/2014   Procedure: TRANSURETHRAL RESECTION OF BLADDER TUMOR (TURBT);  Surgeon: [name], MD;  Location: [facility];  Service: Urology;  Laterality: N/A;  . TUBAL LIGATION      No Known Allergies    Medication List       Accurate as of 02/23/16  6:19 PM. Always use your most recent med list.          alendronate 70 MG tablet Commonly known as:  FOSAMAX Take 70 mg by mouth once a week. Take with a full glass of water on an empty stomach.   atorvastatin 40 MG  tablet Commonly known as:  LIPITOR Take 40 mg by mouth every morning.   AZO CRANBERRY PO Take 2 capsules by mouth daily.   calcitonin (salmon) 200 UNIT/ACT nasal spray Commonly known as:  MIACALCIN/FORTICAL Place 1 spray into alternate nostrils daily.   cyanocobalamin 500 MCG tablet Take 500 mcg by mouth daily.   dabigatran 150 MG Caps capsule Commonly known as:  PRADAXA Take 150 mg by mouth 2 (two) times daily.   digoxin 0.125 MG tablet Commonly known as:  LANOXIN Take 1 tablet (0.125 mg total) by mouth daily.   diltiazem 240 MG 24 hr tablet Commonly known as:  CARDIZEM LA Take 1 tablet (240 mg total) by mouth daily.   docusate sodium 100 MG capsule Commonly known as:  COLACE Take 1 capsule (100 mg total) by mouth every 12 (twelve) hours.   feeding supplement (PRO-STAT SUGAR FREE 64) Liqd Take 30 mLs by mouth 2 (two) times daily.   ferrous sulfate 325 (65 FE) MG tablet Take 325 mg by mouth daily with breakfast.   Fish Oil 1000 MG Caps Take 1,000 mg by mouth daily.   fluticasone 50 MCG/ACT nasal spray Commonly known as:  FLONASE Place 1 spray into both nostrils daily as needed for allergies or rhinitis.   furosemide 40 MG tablet Commonly known as:  LASIX Take 1 tablet (40 mg total) by mouth daily.   guaiFENesin 600 MG 12 hr tablet Commonly known as:  MUCINEX Take 2 tablets (1,200 mg total) by mouth 2 (two) times daily.   HYDROcodone-acetaminophen 5-325 MG tablet Commonly known as:  NORCO/VICODIN Take 1 tablet by mouth every 4 (four) hours as needed.   levalbuterol 1.25 MG/3ML nebulizer solution Commonly known as:  XOPENEX Take 1.25 mg by nebulization every 4 (four) hours as needed for wheezing.   metoprolol 50 MG tablet Commonly known as:  LOPRESSOR Take 1 tablet (50 mg total) by mouth 2 (two) times daily.   multivitamin with minerals tablet Take 1 tablet by mouth daily.   oxybutynin 5 MG 24 hr tablet Commonly known as:  DITROPAN-XL Take 10 mg by  mouth at bedtime.   pantoprazole 40 MG tablet Commonly known as:  PROTONIX Take 1 tablet (40 mg total) by mouth daily.   sertraline 25 MG tablet Commonly known as:  ZOLOFT Take 1 tablet (25 mg total) by mouth at bedtime.   tiotropium 18 MCG inhalation capsule Commonly known as:  SPIRIVA Place 1 capsule (18 mcg total) into inhaler and inhale daily.   tiZANidine 2 MG tablet Commonly known as:  ZANAFLEX Take 2 mg by mouth at bedtime.       Review of Systems  Constitutional: Negative for appetite change, chills and fever.  HENT: Negative for congestion, rhinorrhea, sinus pressure, sneezing and sore throat.   Eyes: Negative.   Respiratory:  Negative for cough, chest tightness, shortness of breath and wheezing.        Oxygen via [place]   Cardiovascular: Negative for chest pain, palpitations and leg swelling.  Gastrointestinal: Negative for abdominal distention, abdominal pain, constipation and diarrhea.  Endocrine: Negative for cold intolerance, heat intolerance, polydipsia, polyphagia and polyuria.  Genitourinary: Negative for dysuria, frequency and urgency.  Musculoskeletal: Positive for gait problem.  Skin: Negative for pallor and rash.  Neurological: Negative for dizziness, seizures, syncope, light-headedness and headaches.  Hematological: Does not bruise/bleed easily.  Psychiatric/Behavioral: Negative for agitation, confusion, hallucinations and sleep disturbance. The patient is not nervous/anxious.     Immunization History  Administered Date(s) Administered  . Influenza-Unspecified 11/13/2013  . PPD Test 12/16/2015   Pertinent  Health Maintenance Due  Topic Date Due  . COLONOSCOPY  06/09/1991  . PNA vac Low Risk Adult (1 of 2 - PCV13) 06/09/2006  . INFLUENZA VACCINE  10/14/2015  . MAMMOGRAM  11/19/2017  . DEXA SCAN  Completed   Fall Risk  12/31/2015 05/22/2015 02/20/2015 11/19/2014 10/07/2014  Falls in the past year? No No No No No  Risk for fall due to : Impaired mobility  History of fall(s) Impaired balance/gait;History of fall(s) Impaired balance/gait;History of fall(s);Impaired mobility History of fall(s);Impaired balance/gait  Risk for fall due to (comments): - - - - -   Functional Status Survey:    Vitals:   02/23/16 1500  BP: 104/60  Pulse: 70  Resp: 20  Temp: 97 F (36.1 C)  SpO2: 94%  Weight: 117 lb 3.2 oz (53.2 kg)  Height: 5\' 1"  (1.549 m)   Body mass index is 22.14 kg/m. Physical Exam  Constitutional: She is oriented to person, place, and time. She appears well-developed and well-nourished.  Pleasant elderly in no acute distress.   HENT:  Head: Normocephalic.  Mouth/Throat: Oropharynx is clear and moist. No oropharyngeal exudate.  Eyes: Conjunctivae and EOM are normal. Pupils are equal, round, and reactive to light. Right eye exhibits no discharge. Left eye exhibits no discharge. No scleral icterus.  Neck: Normal range of motion. No JVD present. No thyromegaly present.  Cardiovascular: Normal rate, regular rhythm, normal heart sounds and intact distal pulses.  Exam reveals no gallop and no friction rub.   No murmur heard. Pulmonary/Chest: Effort normal and breath sounds normal. No respiratory distress. She has no wheezes. She has no rales.  Oxygen 2 Liters via [place]   Abdominal: Soft. Bowel sounds are normal. She exhibits no distension. There is no tenderness. There is no rebound and no guarding.  Musculoskeletal: She exhibits no tenderness.  Unsteady gait.Trace edema to bilateral lower extremities.   Lymphadenopathy:    She has no cervical adenopathy.  Neurological: She is oriented to person, place, and time.  Skin: Skin is warm and dry. No erythema. No pallor.  Psychiatric: She has a normal mood and affect.    Labs reviewed:  Recent Labs  01/17/16 0922  01/20/16 0641  01/21/16 0442 01/22/16 0329 02/02/16  NA  --   < > 139  --  143 140 144  K  --   < > 6.3*  6.2*  < > 5.8* 3.3* 4.0  CL  --   < > 92*  --  93* 87*  --   CO2   --   < > 43*  --  41* 45*  --   GLUCOSE  --   < > 111*  --  118* 83  --   BUN  --   < >  33*  --  27* 26* 20  CREATININE  --   < > 0.75  --  0.77 0.63 0.6  CALCIUM  --   < > 9.1  --  8.6* 8.2*  --   MG 1.8  --   --   --   --   --   --   < > = values in this interval not displayed.  Recent Labs  08/30/15 0825 12/10/15 1526 01/22/16 0329  AST 21 19 20   ALT 29 19 27   ALKPHOS 79 67 49  BILITOT 0.4 0.8 0.6  PROT 6.7 6.4* 4.9*  ALBUMIN 3.5 3.4* 2.5*    Recent Labs  08/30/15 0825  12/10/15 1526  01/17/16 0555  01/19/16 0459 01/20/16 0641 01/22/16 0329 02/02/16  WBC 18.8*  < > 9.6  < > 8.7  < > 18.9* 19.1* 10.6* 12.2  NEUTROABS 14.8*  --  7.9*  --  6.9  --   --   --   --   --   HGB 13.4  < > 11.8*  < > 13.8  < > 12.1 13.6 12.9 12.8  HCT 43.7  < > 40.8  < > 47.9*  < > 42.4 45.7 42.9 43  MCV 88.5  < > 90.9  < > 93.7  < > 92.8 90.5 90.7  --   PLT 251  < > 142*  < > 140*  < > 128* 139* 111* 141*  < > = values in this interval not displayed. Lab Results  Component Value Date   TSH 1.249 01/17/2016   No results found for: HGBA1C Lab Results  Component Value Date   CHOL 102 08/05/2013   HDL 47 08/05/2013   LDLCALC 42 08/05/2013   TRIG 66 08/05/2013   CHOLHDL 2.2 08/05/2013   Assessment/Plan 1. Chronic diastolic congestive heart failure ([place]) Exam findings negative.  Continue on Furosemide 40 mg daily,Diltiazem and Metoprolol. Continue daily weight checks.  2. Essential hypertension B/p stable. Continue on Metoprolol, Diltiazem and furosemide.   3.  Atrial fibrillation  Continue on Pradaxa, Digoxin, diltiazem and Metoprolol.    4. Chronic obstructive pulmonary disease, unspecified COPD type ([place]) Stable. Afebrile. Continue on Mucin ex, lev albuterol, Xopenex and Spiriva. continue oxygen 2 liter via [place].   5. Primary osteoarthritis involving multiple joints Continue PRN pain meds. Continue on restorative exercises.   6. Depression, unspecified depression type Stable.  Continue on Zoloft. Monitor for mood changes.     Family/ staff Communication: Reviewed plan of care with patient and facility Nurse supervisor.   Labs/tests ordered: None

## 2016-02-25 IMAGING — CT CT ABD-PELV W/ CM
2 of 5 series · 16 of 46 positions shown, 18 images · IV contrast (APPLIED)
Comparison: 05/06/2010.

CLINICAL DATA: Rectal bleeding.  RIGHT lower abdominal pain.

EXAM:
CT ABDOMEN AND PELVIS WITH CONTRAST
TECHNIQUE: Multidetector CT imaging of the abdomen and pelvis was performed
using the standard protocol following bolus administration of
intravenous contrast.
CONTRAST:  100mL OMNIPAQUE IOHEXOL 300 MG/ML  SOLN

[Series 2: abd/ pelvis 5.0 i30f 1 · axial · 0.75mm/px · z∈[+968,+1312]mm · 13 of 77 slices shown, 15 images]
[im 4/77  soft-tissue]
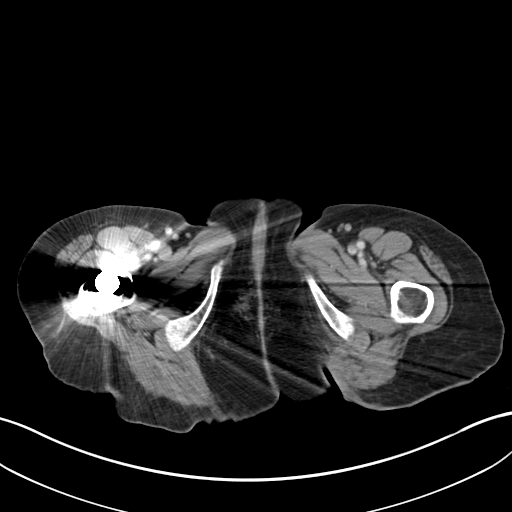
[im 4/77  bone]
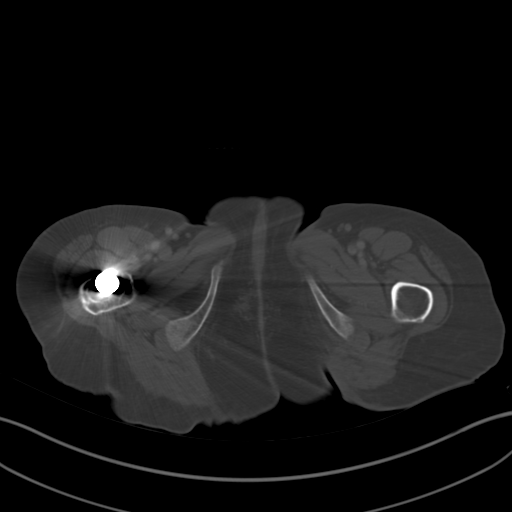
[im 12/77  soft-tissue]
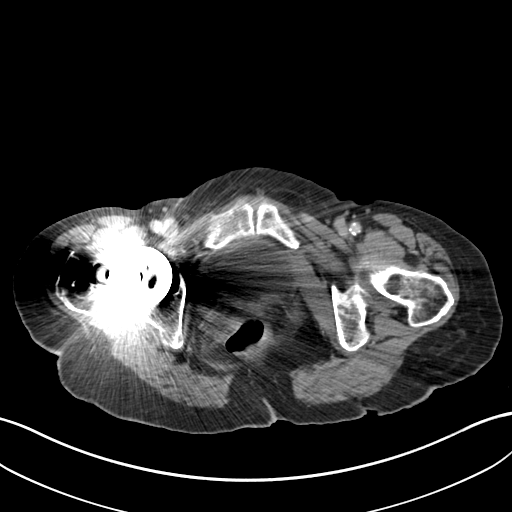
[im 16/77  soft-tissue]
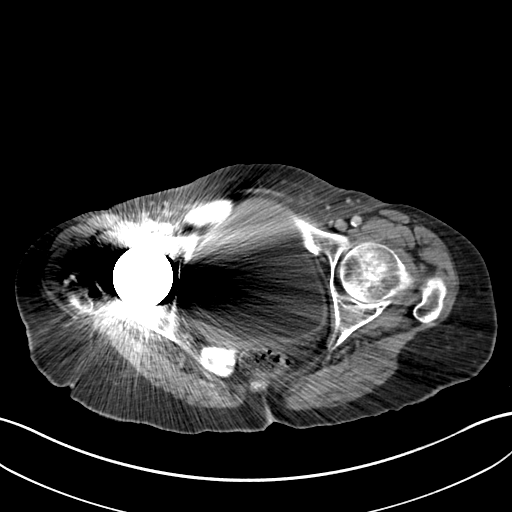
[im 23/77  soft-tissue]
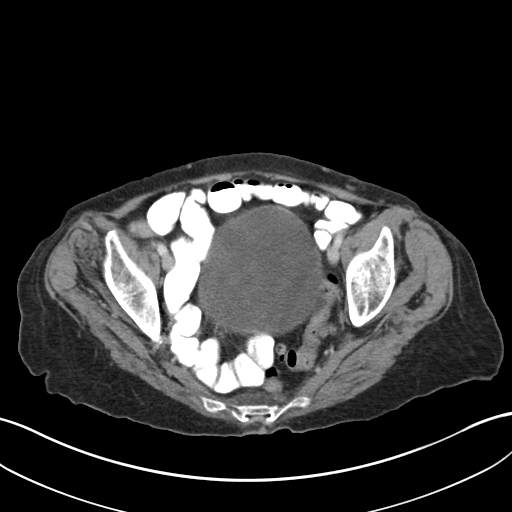
[im 27/77  soft-tissue]
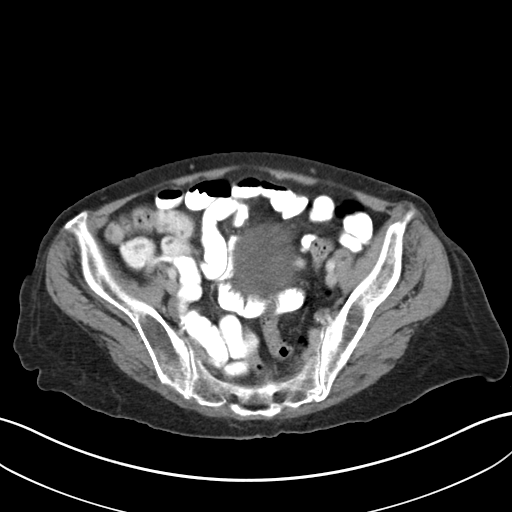
[im 35/77  soft-tissue]
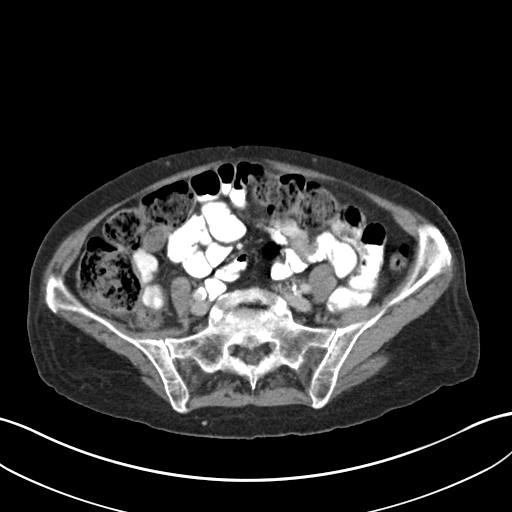
[im 39/77  soft-tissue]
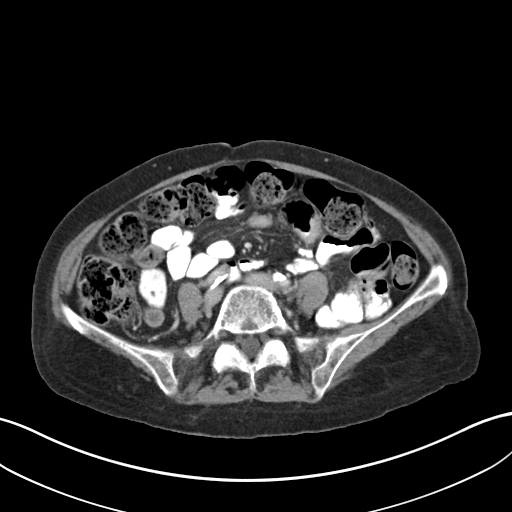
[im 42/77  soft-tissue]
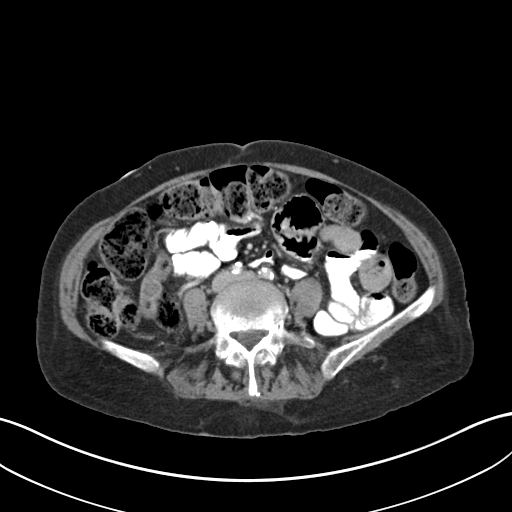
[im 50/77  soft-tissue]
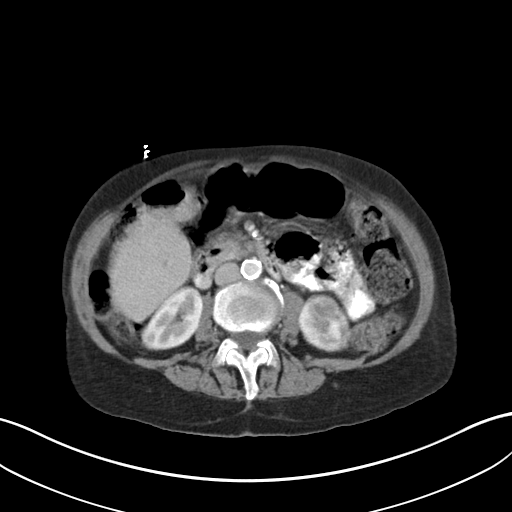
[im 50/77  bone]
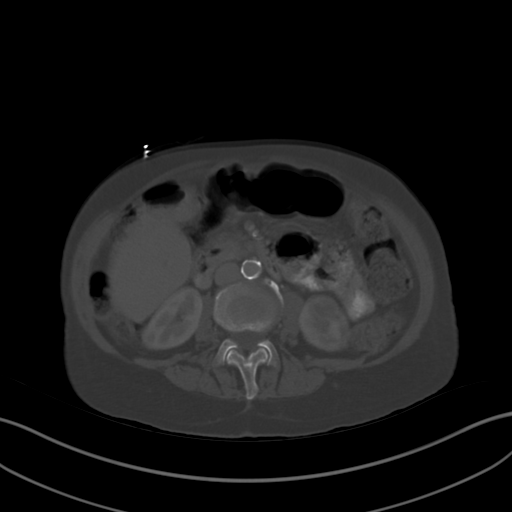
[im 54/77  soft-tissue]
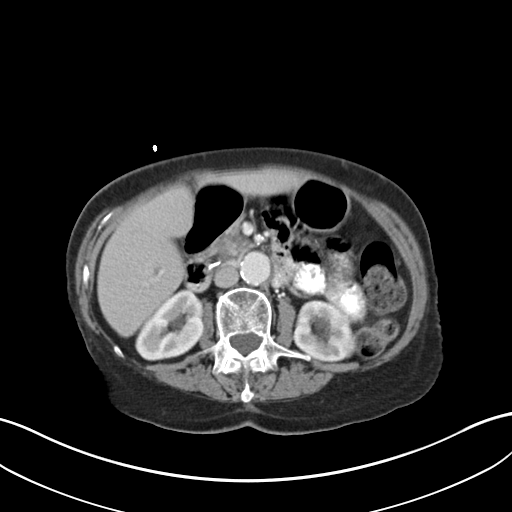
[im 61/77  soft-tissue]
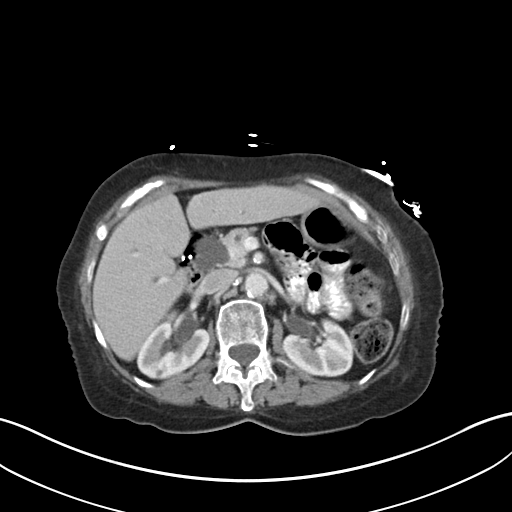
[im 65/77  soft-tissue]
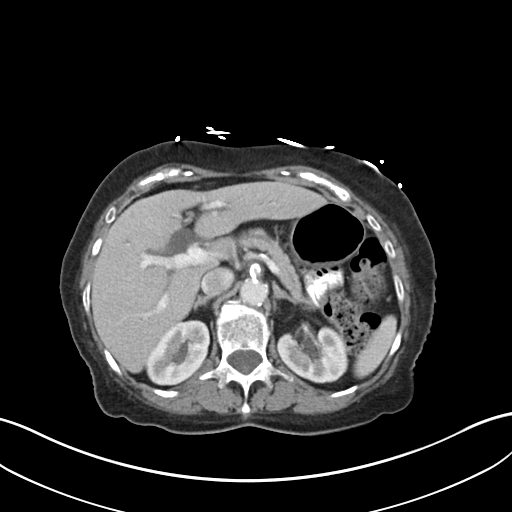
[im 73/77  soft-tissue]
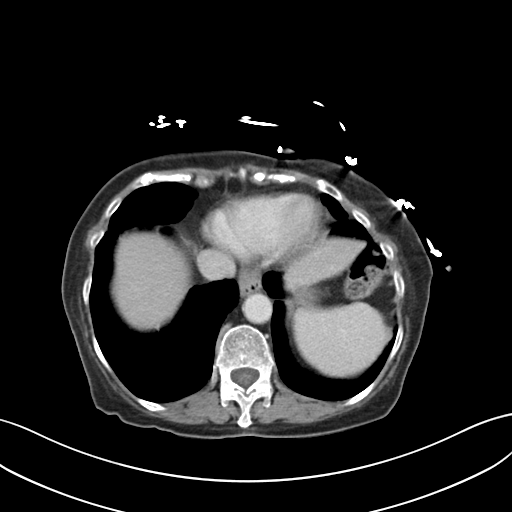

[Series 5: coronal soft tissue · coronal · 0.75mm/px · 3 of 97 slices shown]
[im 33/97  soft-tissue]
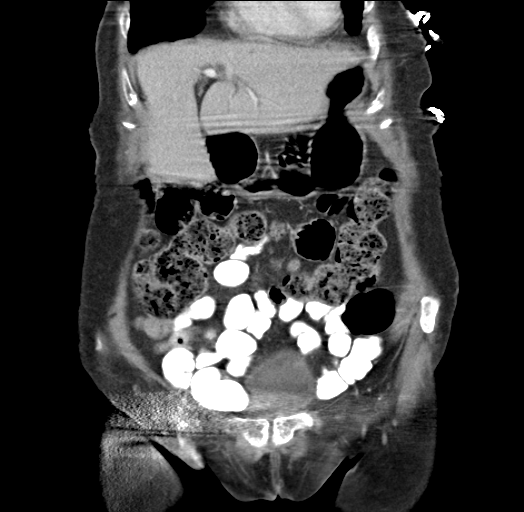
[im 43/97  soft-tissue]
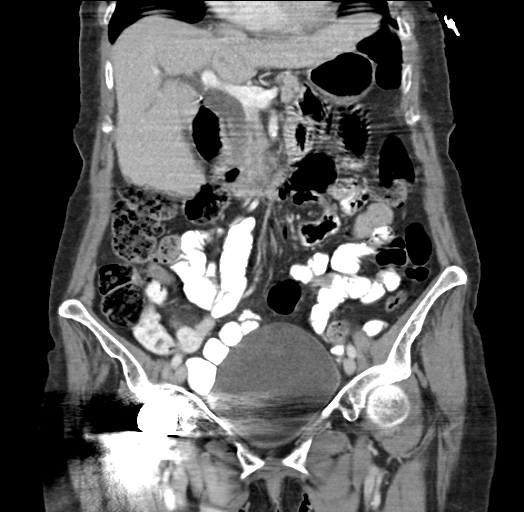
[im 54/97  soft-tissue]
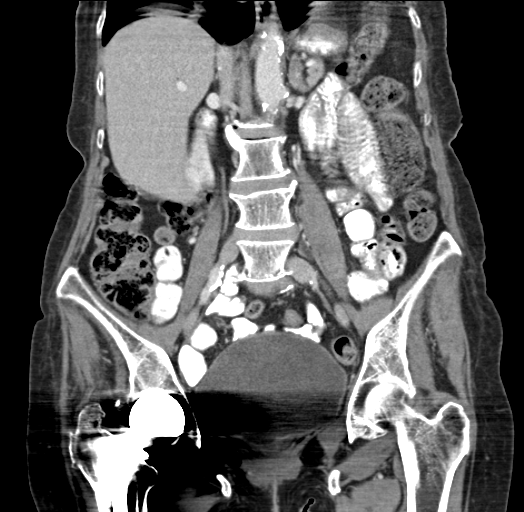

[16 of 46 positions shown; findings below may reference images not displayed]

FINDINGS: Bones: No aggressive osseous lesions. Large Schmorl's noted L4.
Artifact from RIGHT hip hemiarthroplasty.

Lung Bases: Negative.

Liver: Intrahepatic biliary ductal dilation is present, chronic. No
mass lesion.

Spleen:  Old granulomatous disease.

Gallbladder:  Cholecystectomy with clips in the fossa.

Common bile duct: Marked biliary ductal dilation is present. There
appears to be dilation of the cystic duct remnant as well. Common
bile duct measures up to 16 mm. If bilirubin is normal, no further
evaluation is warranted. This recommendation follows ACR consensus
guidelines: White Paper of the ACR Incidental Findings Committee II
on Gallbladder and Biliary Findings. [HOSPITAL]
7455:;[DATE]. No calcified common duct stones.

Pancreas: Dilated pancreatic duct. Metallic density is present near
the and pole, presumably postprocedural or postsurgical. This is
unchanged compared to prior.

Adrenal glands: Stable LEFT adrenal nodule, consistent with adenoma.

Kidneys: Enhancement and excretion of contrast is within normal
limits. No hydronephrosis. The ureters appear within normal limits.

Stomach:  No inflammatory changes of stomach.  No obstruction.

Small bowel: No small bowel obstruction or mesenteric adenopathy. No
inflammatory changes.

Colon: Appendix not identified. No RIGHT lower quadrant inflammatory
changes. Large stool burden is present. Respiratory motion degrades
evaluation of the abdomen.

Pelvic Genitourinary: Hysterectomy. Distended urinary bladder. No
free fluid.

Vasculature: 26 mm infrarenal abdominal aorta. Severe
atherosclerosis. No acute vascular abnormality.

Body Wall: Within normal limits.
IMPRESSION: 1. No acute abnormality.
2. Cholecystectomy with postcholecystectomy dilation of the biliary
system. Correlation with bilirubin recommended. If normal, no
further evaluation is warranted.
3. Ectatic abdominal aorta at risk for aneurysm development.
Recommend follow up by US in 5 years. This recommendation follows
ACR consensus guidelines: White Paper of the ACR Incidental Findings
Committee II on Vascular Findings. [HOSPITAL] 7455;
[DATE].
4. Hysterectomy.

## 2016-03-09 LAB — BASIC METABOLIC PANEL
BUN: 14 mg/dL (ref 4–21)
Creatinine: 0.7 mg/dL (ref 0.5–1.1)
GLUCOSE: 243 mg/dL
Potassium: 4.7 mmol/L (ref 3.4–5.3)
SODIUM: 136 mmol/L — AB (ref 137–147)

## 2016-03-15 DIAGNOSIS — K625 Hemorrhage of anus and rectum: Secondary | ICD-10-CM

## 2016-03-15 HISTORY — DX: Hemorrhage of anus and rectum: K62.5

## 2016-03-21 ENCOUNTER — Encounter (HOSPITAL_COMMUNITY): Payer: Self-pay | Admitting: Emergency Medicine

## 2016-03-21 ENCOUNTER — Inpatient Hospital Stay (HOSPITAL_COMMUNITY)
Admission: EM | Admit: 2016-03-21 | Discharge: 2016-03-27 | DRG: 377 | Disposition: A | Discharge status: Skilled Nursing Facility | Payer: Medicare HMO | Attending: Internal Medicine | Admitting: Internal Medicine

## 2016-03-21 DIAGNOSIS — J80 Acute respiratory distress syndrome: Secondary | ICD-10-CM | POA: Diagnosis not present

## 2016-03-21 DIAGNOSIS — D696 Thrombocytopenia, unspecified: Secondary | ICD-10-CM | POA: Diagnosis not present

## 2016-03-21 DIAGNOSIS — Z7983 Long term (current) use of bisphosphonates: Secondary | ICD-10-CM | POA: Diagnosis not present

## 2016-03-21 DIAGNOSIS — Z87891 Personal history of nicotine dependence: Secondary | ICD-10-CM | POA: Diagnosis not present

## 2016-03-21 DIAGNOSIS — I10 Essential (primary) hypertension: Secondary | ICD-10-CM | POA: Diagnosis not present

## 2016-03-21 DIAGNOSIS — Z66 Do not resuscitate: Secondary | ICD-10-CM | POA: Diagnosis present

## 2016-03-21 DIAGNOSIS — I5033 Acute on chronic diastolic (congestive) heart failure: Secondary | ICD-10-CM | POA: Diagnosis not present

## 2016-03-21 DIAGNOSIS — Z9842 Cataract extraction status, left eye: Secondary | ICD-10-CM

## 2016-03-21 DIAGNOSIS — J449 Chronic obstructive pulmonary disease, unspecified: Secondary | ICD-10-CM | POA: Diagnosis not present

## 2016-03-21 DIAGNOSIS — K625 Hemorrhage of anus and rectum: Secondary | ICD-10-CM | POA: Diagnosis not present

## 2016-03-21 DIAGNOSIS — Z91012 Allergy to eggs: Secondary | ICD-10-CM

## 2016-03-21 DIAGNOSIS — N189 Chronic kidney disease, unspecified: Secondary | ICD-10-CM | POA: Diagnosis present

## 2016-03-21 DIAGNOSIS — I5031 Acute diastolic (congestive) heart failure: Secondary | ICD-10-CM | POA: Diagnosis not present

## 2016-03-21 DIAGNOSIS — Z961 Presence of intraocular lens: Secondary | ICD-10-CM | POA: Diagnosis present

## 2016-03-21 DIAGNOSIS — E785 Hyperlipidemia, unspecified: Secondary | ICD-10-CM | POA: Diagnosis present

## 2016-03-21 DIAGNOSIS — Z9841 Cataract extraction status, right eye: Secondary | ICD-10-CM

## 2016-03-21 DIAGNOSIS — I1310 Hypertensive heart and chronic kidney disease without heart failure, with stage 1 through stage 4 chronic kidney disease, or unspecified chronic kidney disease: Secondary | ICD-10-CM | POA: Diagnosis not present

## 2016-03-21 DIAGNOSIS — H9190 Unspecified hearing loss, unspecified ear: Secondary | ICD-10-CM | POA: Diagnosis not present

## 2016-03-21 DIAGNOSIS — I480 Paroxysmal atrial fibrillation: Secondary | ICD-10-CM | POA: Diagnosis present

## 2016-03-21 DIAGNOSIS — I4891 Unspecified atrial fibrillation: Secondary | ICD-10-CM | POA: Diagnosis present

## 2016-03-21 DIAGNOSIS — K921 Melena: Secondary | ICD-10-CM | POA: Diagnosis present

## 2016-03-21 DIAGNOSIS — I272 Pulmonary hypertension, unspecified: Secondary | ICD-10-CM | POA: Diagnosis present

## 2016-03-21 DIAGNOSIS — D509 Iron deficiency anemia, unspecified: Secondary | ICD-10-CM | POA: Diagnosis not present

## 2016-03-21 DIAGNOSIS — R195 Other fecal abnormalities: Secondary | ICD-10-CM | POA: Diagnosis not present

## 2016-03-21 DIAGNOSIS — K219 Gastro-esophageal reflux disease without esophagitis: Secondary | ICD-10-CM | POA: Diagnosis not present

## 2016-03-21 DIAGNOSIS — Z9981 Dependence on supplemental oxygen: Secondary | ICD-10-CM

## 2016-03-21 DIAGNOSIS — I5032 Chronic diastolic (congestive) heart failure: Secondary | ICD-10-CM | POA: Diagnosis present

## 2016-03-21 DIAGNOSIS — Z515 Encounter for palliative care: Secondary | ICD-10-CM | POA: Diagnosis present

## 2016-03-21 DIAGNOSIS — Z8249 Family history of ischemic heart disease and other diseases of the circulatory system: Secondary | ICD-10-CM | POA: Diagnosis not present

## 2016-03-21 DIAGNOSIS — J9611 Chronic respiratory failure with hypoxia: Secondary | ICD-10-CM | POA: Diagnosis present

## 2016-03-21 DIAGNOSIS — Z79899 Other long term (current) drug therapy: Secondary | ICD-10-CM | POA: Diagnosis not present

## 2016-03-21 DIAGNOSIS — R0602 Shortness of breath: Secondary | ICD-10-CM | POA: Diagnosis not present

## 2016-03-21 DIAGNOSIS — K922 Gastrointestinal hemorrhage, unspecified: Secondary | ICD-10-CM | POA: Diagnosis not present

## 2016-03-21 DIAGNOSIS — R0600 Dyspnea, unspecified: Secondary | ICD-10-CM | POA: Diagnosis not present

## 2016-03-21 DIAGNOSIS — I509 Heart failure, unspecified: Secondary | ICD-10-CM | POA: Diagnosis not present

## 2016-03-21 HISTORY — DX: Hemorrhage of anus and rectum: K62.5

## 2016-03-21 LAB — CBC WITH DIFFERENTIAL/PLATELET
BASOS ABS: 0 10*3/uL (ref 0.0–0.1)
Basophils Relative: 0 %
EOS ABS: 0 10*3/uL (ref 0.0–0.7)
Eosinophils Relative: 0 %
HCT: 44.6 % (ref 36.0–46.0)
HEMOGLOBIN: 12.9 g/dL (ref 12.0–15.0)
LYMPHS PCT: 10 %
Lymphs Abs: 1.1 10*3/uL (ref 0.7–4.0)
MCH: 26.4 pg (ref 26.0–34.0)
MCHC: 28.9 g/dL — ABNORMAL LOW (ref 30.0–36.0)
MCV: 91.2 fL (ref 78.0–100.0)
Monocytes Absolute: 1.3 10*3/uL — ABNORMAL HIGH (ref 0.1–1.0)
Monocytes Relative: 11 %
NEUTROS PCT: 78 %
Neutro Abs: 8.8 10*3/uL — ABNORMAL HIGH (ref 1.7–7.7)
PLATELETS: 145 10*3/uL — AB (ref 150–400)
RBC: 4.89 MIL/uL (ref 3.87–5.11)
RDW: 16.9 % — ABNORMAL HIGH (ref 11.5–15.5)
WBC: 11.2 10*3/uL — AB (ref 4.0–10.5)

## 2016-03-21 LAB — COMPREHENSIVE METABOLIC PANEL
ALK PHOS: 62 U/L (ref 38–126)
ALT: 18 U/L (ref 14–54)
AST: 28 U/L (ref 15–41)
Albumin: 2.9 g/dL — ABNORMAL LOW (ref 3.5–5.0)
Anion gap: 9 (ref 5–15)
BUN: 14 mg/dL (ref 6–20)
CALCIUM: 9 mg/dL (ref 8.9–10.3)
CHLORIDE: 98 mmol/L — AB (ref 101–111)
CO2: 34 mmol/L — AB (ref 22–32)
Creatinine, Ser: 0.73 mg/dL (ref 0.44–1.00)
GFR calc Af Amer: >60 mL/min (ref >60)
GFR calc non Af Amer: >60 mL/min (ref >60)
GLUCOSE: 111 mg/dL — AB (ref 65–99)
Potassium: 4.8 mmol/L (ref 3.5–5.1)
SODIUM: 141 mmol/L (ref 135–145)
Total Bilirubin: 1 mg/dL (ref 0.3–1.2)
Total Protein: 6.1 g/dL — ABNORMAL LOW (ref 6.5–8.1)

## 2016-03-21 LAB — CBC
HCT: 42.7 % (ref 36.0–46.0)
Hemoglobin: 12.5 g/dL (ref 12.0–15.0)
MCH: 26.7 pg (ref 26.0–34.0)
MCHC: 29.3 g/dL — ABNORMAL LOW (ref 30.0–36.0)
MCV: 91 fL (ref 78.0–100.0)
PLATELETS: 145 10*3/uL — AB (ref 150–400)
RBC: 4.69 MIL/uL (ref 3.87–5.11)
RDW: 16.5 % — ABNORMAL HIGH (ref 11.5–15.5)
WBC: 11.3 10*3/uL — AB (ref 4.0–10.5)

## 2016-03-21 LAB — PHOSPHORUS: Phosphorus: 3.9 mg/dL (ref 2.5–4.6)

## 2016-03-21 LAB — POC OCCULT BLOOD, ED: FECAL OCCULT BLD: POSITIVE — AB

## 2016-03-21 LAB — MAGNESIUM: MAGNESIUM: 1.8 mg/dL (ref 1.7–2.4)

## 2016-03-21 MED ORDER — SERTRALINE HCL 50 MG PO TABS
25.0000 mg | ORAL_TABLET | Freq: Every day | ORAL | Status: DC
  Administered 2016-03-21 – 2016-03-26 (×6): 25 mg via ORAL
  Filled 2016-03-21 (×6): qty 1

## 2016-03-21 MED ORDER — MORPHINE SULFATE (PF) 2 MG/ML IV SOLN
1.0000 mg | INTRAVENOUS | Status: DC | PRN
  Administered 2016-03-23: 2 mg via INTRAVENOUS
  Filled 2016-03-21: qty 1

## 2016-03-21 MED ORDER — ONDANSETRON HCL 4 MG PO TABS: 4.0000 mg | ORAL_TABLET | Freq: Four times a day (QID) | ORAL | Status: DC | PRN

## 2016-03-21 MED ORDER — TIZANIDINE HCL 2 MG PO TABS
2.0000 mg | ORAL_TABLET | Freq: Every day | ORAL | Status: DC
  Administered 2016-03-21 – 2016-03-26 (×6): 2 mg via ORAL
  Filled 2016-03-21 (×6): qty 1

## 2016-03-21 MED ORDER — FAMOTIDINE IN NACL 20-0.9 MG/50ML-% IV SOLN
20.0000 mg | Freq: Two times a day (BID) | INTRAVENOUS | Status: DC
  Administered 2016-03-22 – 2016-03-24 (×5): 20 mg via INTRAVENOUS
  Filled 2016-03-21 (×6): qty 50

## 2016-03-21 MED ORDER — OXYBUTYNIN CHLORIDE ER 10 MG PO TB24
10.0000 mg | ORAL_TABLET | Freq: Every day | ORAL | Status: DC
  Administered 2016-03-21 – 2016-03-26 (×6): 10 mg via ORAL
  Filled 2016-03-21 (×7): qty 1

## 2016-03-21 MED ORDER — LEVALBUTEROL HCL 1.25 MG/0.5ML IN NEBU
1.2500 mg | INHALATION_SOLUTION | RESPIRATORY_TRACT | Status: DC | PRN
  Administered 2016-03-22 – 2016-03-25 (×5): 1.25 mg via RESPIRATORY_TRACT
  Filled 2016-03-21 (×8): qty 0.5

## 2016-03-21 MED ORDER — CALCITONIN (SALMON) 200 UNIT/ACT NA SOLN
1.0000 | Freq: Every day | NASAL | Status: DC
  Administered 2016-03-22 – 2016-03-27 (×6): 1 via NASAL
  Filled 2016-03-21 (×2): qty 3.7

## 2016-03-21 MED ORDER — FLUTICASONE PROPIONATE 50 MCG/ACT NA SUSP: 1.0000 | Freq: Every day | NASAL | Status: DC | PRN

## 2016-03-21 MED ORDER — DIGOXIN 125 MCG PO TABS
0.1250 mg | ORAL_TABLET | Freq: Every day | ORAL | Status: DC
  Administered 2016-03-22 – 2016-03-27 (×6): 0.125 mg via ORAL
  Filled 2016-03-21 (×6): qty 1

## 2016-03-21 MED ORDER — TIOTROPIUM BROMIDE MONOHYDRATE 18 MCG IN CAPS
18.0000 ug | ORAL_CAPSULE | Freq: Every day | RESPIRATORY_TRACT | Status: DC
  Administered 2016-03-22 – 2016-03-26 (×5): 18 ug via RESPIRATORY_TRACT
  Filled 2016-03-21 (×2): qty 5

## 2016-03-21 MED ORDER — SODIUM CHLORIDE 0.9% FLUSH
3.0000 mL | Freq: Two times a day (BID) | INTRAVENOUS | Status: DC
  Administered 2016-03-21 – 2016-03-26 (×9): 3 mL via INTRAVENOUS

## 2016-03-21 MED ORDER — ONDANSETRON HCL 4 MG/2ML IJ SOLN: 4.0000 mg | Freq: Four times a day (QID) | INTRAMUSCULAR | Status: DC | PRN

## 2016-03-21 MED ORDER — SODIUM CHLORIDE 0.9 % IV SOLN
INTRAVENOUS | Status: DC
  Administered 2016-03-21: 75 mL/h via INTRAVENOUS
  Administered 2016-03-22: 22:00:00 via INTRAVENOUS

## 2016-03-21 MED ORDER — ACETAMINOPHEN 650 MG RE SUPP: 650.0000 mg | Freq: Four times a day (QID) | RECTAL | Status: DC | PRN

## 2016-03-21 MED ORDER — PANTOPRAZOLE SODIUM 40 MG PO TBEC
40.0000 mg | DELAYED_RELEASE_TABLET | Freq: Every day | ORAL | Status: DC
  Administered 2016-03-22 – 2016-03-27 (×6): 40 mg via ORAL
  Filled 2016-03-21 (×7): qty 1

## 2016-03-21 MED ORDER — ACETAMINOPHEN 325 MG PO TABS
650.0000 mg | ORAL_TABLET | Freq: Four times a day (QID) | ORAL | Status: DC | PRN
Start: 2016-03-21 — End: 2016-03-27
  Administered 2016-03-21 – 2016-03-25 (×6): 650 mg via ORAL
  Filled 2016-03-21 (×6): qty 2

## 2016-03-21 MED ORDER — LEVALBUTEROL HCL 1.25 MG/3ML IN NEBU: 1.2500 mg | INHALATION_SOLUTION | RESPIRATORY_TRACT | Status: DC | PRN

## 2016-03-21 NOTE — Consult Note (Signed)
Consultation  Referring Provider:     [facility] Primary Care Physician:  Blanchie Serve, MD Primary Gastroenterologist:        Lady [name] Reason for Consultation:     Rectal bleeding     Impression / Plan:   Rectal bleeding - seen in diaper with brown stool and heme + in ED Non-tender rectal exam with brown stool on my examPradaxa tx for Afib stroke prophylaxis No known hx colonoscopy NL Hgb  Pradaxa is held GI will f/u tomorrow - consider endoscopic evaluation - indications would be rectal bleeding/heme +  Better part of valor may be to do this after Pradaxa off x 48 hrs  Dr. Collene Mares to see toorrow           HPI:   [name] [name] is a 76 y.o. female sent from [facility] to ED after red blood seen w/ stool today. She says has happened before, Says has some constipation at times and rectal pain w/ defecation at times. Denies seeing GI in past. Feels ok now except she is cold.  Past Medical History:  Diagnosis Date  . Anemia   . Arthritis    "back" (11/21/2014)  . Asthma   . Blood dyscrasia    hx blood clotts  . Chronic bronchitis ([place])   . Chronic kidney disease   . Chronic lower back pain   . 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 GERD (gastroesophageal reflux disease)   . [id number])    "a few times/year" (11/21/2014)  . History of blood transfusion X 2   "related to blood thinner I was taking"  . Hyperlipidemia   . Hypertension   . Migraine    "weekly probably" (11/21/2014)  . On home oxygen therapy    "3L; 24/7" (11/21/2014)  . Pneumonia    hx pneumonia   . Pneumothorax on right 09/2014  . Pulmonary embolism ([place])   . Shortness of breath   . Urinary incontinence     Past Surgical History:  Procedure Laterality Date  . ABDOMINAL HYSTERECTOMY    . CATARACT EXTRACTION W/ INTRAOCULAR LENS  IMPLANT, BILATERAL Bilateral   . CYSTOSCOPY W/ RETROGRADES Bilateral 06/11/2014   Procedure: CYSTOSCOPY WITH RETROGRADE PYELOGRAM;  Surgeon:  [name], MD;  Location: [facility];  Service: Urology;  Laterality: Bilateral;  . FRACTURE SURGERY    . HARDWARE REMOVAL  01/29/2012   Procedure: HARDWARE REMOVAL;  Surgeon: [name], MD;  Location: [place];  Service: Orthopedics;  Laterality: Right;  . HEMIARTHROPLASTY SHOULDER FRACTURE Right   . HIP ARTHROPLASTY  01/29/2012   Procedure: ARTHROPLASTY BIPOLAR HIP;  Surgeon: [name], MD;  Location: [place];  Service: Orthopedics;  Laterality: Right;  . LAPAROSCOPIC CHOLECYSTECTOMY    . ORIF WRIST FRACTURE  10/13/2011   Procedure: OPEN REDUCTION INTERNAL FIXATION (ORIF) WRIST FRACTURE;  Surgeon: [name], MD;  Location: [place];  Service: Orthopedics;  Laterality: Right;  Open Reduction Internal Fixation Right Distal Radius   . TRANSURETHRAL RESECTION OF BLADDER TUMOR N/A 06/11/2014   Procedure: TRANSURETHRAL RESECTION OF BLADDER TUMOR (TURBT);  Surgeon: [name], MD;  Location: [facility];  Service: Urology;  Laterality: N/A;  . TUBAL LIGATION      Family History  Problem Relation Age of Onset  . Cancer - Lung Mother   . Coronary artery disease Mother   . Coronary artery disease Sister   . Coronary artery disease Brother  Social History  Substance Use Topics  . Smoking status: Former Smoker    Packs/day: 1.00    Years: 56.00    Types: Cigarettes    Quit date: 02/12/2014  . Smokeless tobacco: Never Used  . Alcohol use No    Prior to Admission medications   Medication Sig Start Date End Date Taking? Authorizing Provider  alendronate (FOSAMAX) 70 MG tablet Take 70 mg by mouth every Friday. Take with a full glass of water on an empty stomach.    Yes Historical Provider, MD  Amino Acids-Protein Hydrolys (FEEDING SUPPLEMENT, PRO-STAT SUGAR FREE 64,) LIQD Take 30 mLs by mouth 2 (two) times daily.   Yes Historical Provider, MD  atorvastatin (LIPITOR) 40 MG tablet Take 40 mg by mouth daily at 6 PM.  07/02/13  Yes Historical Provider, MD  calcitonin, salmon,  (MIACALCIN/FORTICAL) 200 UNIT/ACT nasal spray Place 1 spray into alternate nostrils daily. 12/08/15  Yes Historical Provider, MD  calcium-vitamin D (OSCAL 500/200 D-3) 500-200 MG-UNIT tablet Take 1 tablet by mouth 3 (three) times daily with meals.   Yes Historical Provider, MD  Cranberry-Vitamin C-Probiotic (AZO CRANBERRY PO) Take 2 capsules by mouth every evening.    Yes Historical Provider, MD  cyanocobalamin 500 MCG tablet Take 500 mcg by mouth daily.   Yes Historical Provider, MD  dabigatran (PRADAXA) 150 MG CAPS capsule Take 150 mg by mouth 2 (two) times daily.   Yes Historical Provider, MD  digoxin (LANOXIN) 0.125 MG tablet Take 1 tablet (0.125 mg total) by mouth daily. 12/17/15  Yes Verlee Monte, MD  diltiazem (CARDIZEM LA) 240 MG 24 hr tablet Take 1 tablet (240 mg total) by mouth daily. 01/22/16  Yes Reyne Dumas, MD  docusate sodium (COLACE) 100 MG capsule Take 1 capsule (100 mg total) by mouth every 12 (twelve) hours. 10/21/15  Yes Tanna Furry, MD  ferrous sulfate 325 (65 FE) MG tablet Take 325 mg by mouth daily with breakfast.   Yes Historical Provider, MD  fluticasone (FLONASE) 50 MCG/ACT nasal spray Place 1 spray into both nostrils daily as needed for allergies or rhinitis. 08/06/13  Yes Barton Dubois, MD  furosemide (LASIX) 40 MG tablet Take 1 tablet (40 mg total) by mouth daily. Patient taking differently: Take 60 mg by mouth daily.  02/16/16  Yes Dinah C Ngetich, NP  guaiFENesin (MUCINEX) 600 MG 12 hr tablet Take 2 tablets (1,200 mg total) by mouth 2 (two) times daily. 12/16/15  Yes Verlee Monte, MD  HYDROcodone-acetaminophen (NORCO/VICODIN) 5-325 MG tablet Take 1 tablet by mouth every 4 (four) hours as needed. Patient taking differently: Take 1 tablet by mouth every 4 (four) hours as needed for moderate pain.  01/22/16  Yes Reyne Dumas, MD  levalbuterol (XOPENEX) 1.25 MG/3ML nebulizer solution Take 1.25 mg by nebulization every 4 (four) hours as needed for wheezing. 01/22/16  Yes Reyne Dumas,  MD  metoprolol (LOPRESSOR) 50 MG tablet Take 1 tablet (50 mg total) by mouth 2 (two) times daily. 12/16/15  Yes Verlee Monte, MD  Multiple Vitamins-Minerals (MULTIVITAMIN WITH MINERALS) tablet Take 1 tablet by mouth daily.   Yes Historical Provider, MD  Omega-3 Fatty Acids (FISH OIL) 1000 MG CAPS Take 1,000 mg by mouth at bedtime.    Yes Historical Provider, MD  OVER THE COUNTER MEDICATION Take 120 mLs by mouth 2 (two) times daily. Med pass   Yes Historical Provider, MD  oxybutynin (DITROPAN-XL) 10 MG 24 hr tablet Take 10 mg by mouth at bedtime.   Yes Historical Provider,  MD  OXYGEN Inhale 3 L into the lungs daily as needed (for SOB).    Yes Historical Provider, MD  pantoprazole (PROTONIX) 40 MG tablet Take 1 tablet (40 mg total) by mouth daily. [id number]  Yes Delora Fuel, MD  potassium chloride SA (K-DUR,KLOR-CON) 20 MEQ tablet Take 20 mEq by mouth daily.   Yes Historical Provider, MD  sertraline (ZOLOFT) 25 MG tablet Take 1 tablet (25 mg total) by mouth at bedtime. 09/24/15  Yes Juliet Rude, MD  tiotropium (SPIRIVA) 18 MCG inhalation capsule Place 1 capsule (18 mcg total) into inhaler and inhale daily. 08/06/13  Yes Barton Dubois, MD  tiZANidine (ZANAFLEX) 2 MG tablet Take 2 mg by mouth at bedtime.   Yes Historical Provider, MD    Current Facility-Administered Medications  Medication Dose Route Frequency Provider Last Rate Last Dose  . 0.9 %  sodium chloride infusion   Intravenous Continuous Samella Parr, NP      . acetaminophen (TYLENOL) tablet 650 mg  650 mg Oral Q6H PRN Samella Parr, NP       Or  . acetaminophen (TYLENOL) suppository 650 mg  650 mg Rectal Q6H PRN Samella Parr, NP      . Derrill Memo ON 03/22/2016] calcitonin (salmon) (MIACALCIN/FORTICAL) nasal spray 1 spray  1 spray Alternating Nares Daily Samella Parr, NP      . Derrill Memo ON 03/22/2016] digoxin (LANOXIN) tablet 0.125 mg  0.125 mg Oral Daily Samella Parr, NP      . famotidine (PEPCID) IVPB 20 mg premix  20 mg Intravenous  Q12H Samella Parr, NP      . fluticasone (FLONASE) 50 MCG/ACT nasal spray 1 spray  1 spray Each Nare Daily PRN Samella Parr, NP      . levalbuterol Penne Lash) nebulizer solution 1.25 mg  1.25 mg Nebulization Q4H PRN Samella Parr, NP      . morphine 2 MG/ML injection 1-2 mg  1-2 mg Intravenous Q2H PRN Samella Parr, NP      . ondansetron [facility]) tablet 4 mg  4 mg Oral Q6H PRN Samella Parr, NP       Or  . ondansetron [facility]) injection 4 mg  4 mg Intravenous Q6H PRN Samella Parr, NP      . oxybutynin (DITROPAN-XL) 24 hr tablet 10 mg  10 mg Oral QHS Samella Parr, NP      . Derrill Memo ON 03/22/2016] pantoprazole (PROTONIX) EC tablet 40 mg  40 mg Oral Daily Samella Parr, NP      . sertraline (ZOLOFT) tablet 25 mg  25 mg Oral QHS Samella Parr, NP      . sodium chloride flush (NS) 0.9 % injection 3 mL  3 mL Intravenous Q12H Samella Parr, NP      . Derrill Memo ON 03/22/2016] tiotropium (SPIRIVA) inhalation capsule 18 mcg  18 mcg Inhalation Daily Samella Parr, NP      . tiZANidine (ZANAFLEX) tablet 2 mg  2 mg Oral QHS Samella Parr, NP        Allergies as of 03/21/2016 - Review Complete 03/21/2016  Allergen Reaction Noted  . Eggs or egg-derived products Other (See Comments) 11/22/2014     Review of Systems:    This is positive for those things mentioned in the HPI, also positive for SOB/dyspnea "I need a breathing treatment, I have COPD because I smoked". All other review of systems are negative.  Physical Exam:  Vital signs in last 24 hours: Temp:  [97.4 F (36.3 C)] 97.4 F (36.3 C) (01/07 1201) Pulse Rate:  [65-84] 65 (01/07 1545) Resp:  [16] 16 (01/07 1201) BP: (115-127)/(64-77) 127/77 (01/07 1545) SpO2:  [98 %] 98 % (01/07 1545)    General:  Elderly ww and in no acute distress Eyes:  anicteric. Lungs: Clear to auscultation bilaterally but decreased BS Heart:  S1S2, no rubs, murmurs, gallops. Abdomen:  soft, non-tender, no mass and BS+.   Rectal:  Female staff present:  NL anoderm nontender , no mass brown soft stool present  Extremities:   no edema Psych:  appropriate mood and  Affect.   Data Reviewed:   LAB RESULTS:  Recent Labs  03/21/16 1218  WBC 11.2*  HGB 12.9  HCT 44.6  PLT 145*   BMET  Recent Labs  03/21/16 1218  NA 141  K 4.8  CL 98*  CO2 34*  GLUCOSE 111*  BUN 14  CREATININE 0.73  CALCIUM 9.0   LFT  Recent Labs  03/21/16 1218  PROT 6.1*  ALBUMIN 2.9*  AST 28  ALT 18  ALKPHOS 62  BILITOT 1.0     Thanks   LOS: 0 days   @[email or web address]  Simonne Maffucci, MD, [facility] @  03/21/2016, 5:28 PM

## 2016-03-21 NOTE — H&P (Signed)
History and Physical    [name] [name] [id number] DOB: 10/09/1940 DOA: 03/21/2016   PCP: Blanchie Serve, MD   Patient coming from/Resides with: Skilled nursing facility  Admission status: Observation/telemetry -it may be medically necessary to stay a minimum 2 midnights to rule out impending and/or unexpected changes in physiologic status that may differ from initial evaluation performed in the ER and/or at time of admission these consider reevaluation of admission status in 24 hours.   Chief Complaint: Rectal bleeding  HPI: [name] [name] is a 76 y.o. female with medical history significant for chronic hypoxemia secondary to COPD on 3 L nasal cannula oxygen, atrial fibrillation on Pradaxa, microcytic anemia, hypertension, dyslipidemia, GERD, running diastolic heart failure. Patient has also had issues with recurrent spontaneous pneumothorax in the past. Patient was sent to the ER from the nursing facility today after staff noticed bright red blood in the patient's stool-it was blood mixed with yellow stool. In discussing with the patient, she's had one week of epigastric abdominal pain. About a week ago she noticed some blood in her stools. Today she has had 2 stools that she knows of associated with bleeding. She has not had any fevers or chills. Of note she takes Fosamax regularly. She has not been prescribed NSAIDs. And as noted she takes Pradaxa.  ED Course:  Vital Signs: BP 115/64 (BP Location: Right Arm)   Pulse 84   Temp 97.4 F (36.3 C) (Oral)   Resp 16   SpO2 98%  Lab data: Sodium 141, potassium 4.8, chloride 98, CO2 34, glucose 111, BUN 14, creatinine 0.73 LFTs are normal except for albumin 2.9 and protein 6.1, white count 11,200 with neutrophils 78%, absolute neutrophils 8.8%, hemoglobin 12.9, platelets 145,000, FOB was positive, patient has been typed and screened in the ER Medications and treatment: None  Review of Systems:  In addition to the HPI above,  No  Fever-chills, myalgias or other constitutional symptoms No Headache, changes with Vision or hearing, new weakness, tingling, numbness in any extremity, dizziness, dysarthria or word finding difficulty, gait disturbance or imbalance, tremors or seizure activity No problems swallowing food or Liquids, indigestion/reflux, choking or coughing while eating No Chest pain, Cough or Shortness of Breath, palpitations, orthopnea or DOE No N/V, dark tarry stools, constipation No dysuria, malodorous urine, hematuria or flank pain No new skin rashes, lesions, masses or bruises, No new joint pains, aches, swelling or redness No recent unintentional weight gain or loss No polyuria, polydypsia or polyphagia   Past Medical History:  Diagnosis Date  . Anemia   . Arthritis    "back" (11/21/2014)  . Asthma   . Blood dyscrasia    hx blood clotts  . Chronic bronchitis ([place])   . Chronic kidney disease   . Chronic lower back pain   . 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 GERD (gastroesophageal reflux disease)   . [id number])    "a few times/year" (11/21/2014)  . History of blood transfusion X 2   "related to blood thinner I was taking"  . Hyperlipidemia   . Hypertension   . Migraine    "weekly probably" (11/21/2014)  . On home oxygen therapy    "3L; 24/7" (11/21/2014)  . Pneumonia    hx pneumonia   . Pneumothorax on right 09/2014  . Pulmonary embolism ([place])   . Shortness of breath   . Urinary incontinence     Past Surgical History:  Procedure Laterality Date  .  ABDOMINAL HYSTERECTOMY    . CATARACT EXTRACTION W/ INTRAOCULAR LENS  IMPLANT, BILATERAL Bilateral   . CYSTOSCOPY W/ RETROGRADES Bilateral 06/11/2014   Procedure: CYSTOSCOPY WITH RETROGRADE PYELOGRAM;  Surgeon: [name], MD;  Location: [facility];  Service: Urology;  Laterality: Bilateral;  . FRACTURE SURGERY    . HARDWARE REMOVAL  01/29/2012   Procedure: HARDWARE REMOVAL;  Surgeon: [name], MD;   Location: [facility];  Service: Orthopedics;  Laterality: Right;  . HEMIARTHROPLASTY SHOULDER FRACTURE Right   . HIP ARTHROPLASTY  01/29/2012   Procedure: ARTHROPLASTY BIPOLAR HIP;  Surgeon: [name], MD;  Location: [facility];  Service: Orthopedics;  Laterality: Right;  . LAPAROSCOPIC CHOLECYSTECTOMY    . ORIF WRIST FRACTURE  10/13/2011   Procedure: OPEN REDUCTION INTERNAL FIXATION (ORIF) WRIST FRACTURE;  Surgeon: [name], MD;  Location: [facility];  Service: Orthopedics;  Laterality: Right;  Open Reduction Internal Fixation Right Distal Radius   . TRANSURETHRAL RESECTION OF BLADDER TUMOR N/A 06/11/2014   Procedure: TRANSURETHRAL RESECTION OF BLADDER TUMOR (TURBT);  Surgeon: [name], MD;  Location: [facility];  Service: Urology;  Laterality: N/A;  . TUBAL LIGATION      Social History   Social History  . Marital status: Widowed    Spouse name: N/A  . Number of children: N/A  . Years of education: N/A   Occupational History  . Not on file.   Social History Main Topics  . Smoking status: Former Smoker    Packs/day: 1.00    Years: 56.00    Types: Cigarettes    Quit date: 02/12/2014  . Smokeless tobacco: Never Used  . Alcohol use No  . Drug use: No  . Sexual activity: Not on file   Other Topics Concern  . Not on file   Social History Narrative  . No narrative on file    Mobility: Wheelchair Work history: Retired   Allergies  Allergen Reactions  . Eggs Or Egg-Derived Products Other (See Comments)    Listed on MAR    Family History  Problem Relation Age of Onset  . Cancer - Lung Mother   . Coronary artery disease Mother   . Coronary artery disease Sister   . Coronary artery disease Brother      Prior to Admission medications   Medication Sig Start Date End Date Taking? Authorizing Provider  alendronate (FOSAMAX) 70 MG tablet Take 70 mg by mouth every Friday. Take with a full glass of water on an empty stomach.    Yes Historical Provider, MD  Amino Acids-Protein  Hydrolys (FEEDING SUPPLEMENT, PRO-STAT SUGAR FREE 64,) LIQD Take 30 mLs by mouth 2 (two) times daily.   Yes Historical Provider, MD  atorvastatin (LIPITOR) 40 MG tablet Take 40 mg by mouth daily at 6 PM.  07/02/13  Yes Historical Provider, MD  calcitonin, salmon, (MIACALCIN/FORTICAL) 200 UNIT/ACT nasal spray Place 1 spray into alternate nostrils daily. 12/08/15  Yes Historical Provider, MD  calcium-vitamin D (OSCAL 500/200 D-3) 500-200 MG-UNIT tablet Take 1 tablet by mouth 3 (three) times daily with meals.   Yes Historical Provider, MD  Cranberry-Vitamin C-Probiotic (AZO CRANBERRY PO) Take 2 capsules by mouth every evening.    Yes Historical Provider, MD  cyanocobalamin 500 MCG tablet Take 500 mcg by mouth daily.   Yes Historical Provider, MD  dabigatran (PRADAXA) 150 MG CAPS capsule Take 150 mg by mouth 2 (two) times daily.   Yes Historical Provider, MD  digoxin (LANOXIN) 0.125 MG tablet Take 1  tablet (0.125 mg total) by mouth daily. 12/17/15  Yes Verlee Monte, MD  diltiazem (CARDIZEM LA) 240 MG 24 hr tablet Take 1 tablet (240 mg total) by mouth daily. 01/22/16  Yes Reyne Dumas, MD  docusate sodium (COLACE) 100 MG capsule Take 1 capsule (100 mg total) by mouth every 12 (twelve) hours. 10/21/15  Yes Tanna Furry, MD  ferrous sulfate 325 (65 FE) MG tablet Take 325 mg by mouth daily with breakfast.   Yes Historical Provider, MD  fluticasone (FLONASE) 50 MCG/ACT nasal spray Place 1 spray into both nostrils daily as needed for allergies or rhinitis. 08/06/13  Yes Barton Dubois, MD  furosemide (LASIX) 40 MG tablet Take 1 tablet (40 mg total) by mouth daily. Patient taking differently: Take 60 mg by mouth daily.  02/16/16  Yes Dinah C Ngetich, NP  guaiFENesin (MUCINEX) 600 MG 12 hr tablet Take 2 tablets (1,200 mg total) by mouth 2 (two) times daily. 12/16/15  Yes Verlee Monte, MD  HYDROcodone-acetaminophen (NORCO/VICODIN) 5-325 MG tablet Take 1 tablet by mouth every 4 (four) hours as needed. Patient taking  differently: Take 1 tablet by mouth every 4 (four) hours as needed for moderate pain.  01/22/16  Yes Reyne Dumas, MD  levalbuterol (XOPENEX) 1.25 MG/3ML nebulizer solution Take 1.25 mg by nebulization every 4 (four) hours as needed for wheezing. 01/22/16  Yes Reyne Dumas, MD  metoprolol (LOPRESSOR) 50 MG tablet Take 1 tablet (50 mg total) by mouth 2 (two) times daily. 12/16/15  Yes Verlee Monte, MD  Multiple Vitamins-Minerals (MULTIVITAMIN WITH MINERALS) tablet Take 1 tablet by mouth daily.   Yes Historical Provider, MD  Omega-3 Fatty Acids (FISH OIL) 1000 MG CAPS Take 1,000 mg by mouth at bedtime.    Yes Historical Provider, MD  OVER THE COUNTER MEDICATION Take 120 mLs by mouth 2 (two) times daily. Med pass   Yes Historical Provider, MD  oxybutynin (DITROPAN-XL) 10 MG 24 hr tablet Take 10 mg by mouth at bedtime.   Yes Historical Provider, MD  OXYGEN Inhale 3 L into the lungs daily as needed (for SOB).    Yes Historical Provider, MD  pantoprazole (PROTONIX) 40 MG tablet Take 1 tablet (40 mg total) by mouth daily. [id number]  Yes Delora Fuel, MD  potassium chloride SA (K-DUR,KLOR-CON) 20 MEQ tablet Take 20 mEq by mouth daily.   Yes Historical Provider, MD  sertraline (ZOLOFT) 25 MG tablet Take 1 tablet (25 mg total) by mouth at bedtime. 09/24/15  Yes Juliet Rude, MD  tiotropium (SPIRIVA) 18 MCG inhalation capsule Place 1 capsule (18 mcg total) into inhaler and inhale daily. 08/06/13  Yes Barton Dubois, MD  tiZANidine (ZANAFLEX) 2 MG tablet Take 2 mg by mouth at bedtime.   Yes Historical Provider, MD    Physical Exam: Vitals:   03/21/16 1201  BP: 115/64  Pulse: 84  Resp: 16  Temp: 97.4 F (36.3 C)  TempSrc: Oral  SpO2: 98%      Constitutional: NAD, calm, comfortable Eyes: PERRL, lids and conjunctivae normal ENMT: Mucous membranes are moist. Posterior pharynx clear of any exudate or lesions.Normal dentition.  Neck: normal, supple, no masses, no thyromegaly Respiratory: clear to auscultation  bilaterally, no wheezing, no cracklesOn the lungs diminished well. Normal respiratory effort. No accessory muscle use. Nasal cannula oxygen at 3 L/m  Cardiovascular: Regular rate and rhythm, no murmurs / rubs / gallops. Soft, spongy 2+ bilateral lower extremity edema. 2+ pedal pulses. No carotid bruits.  Abdomen: no tenderness, no masses palpated. No  hepatosplenomegaly. Bowel sounds positive.  Musculoskeletal: no clubbing / cyanosis. No joint deformity upper and lower extremities. Good ROM, no contractures. Normal muscle tone.  Skin: no rashes, lesions, ulcers. No induration Neurologic: CN 2-12 grossly intact. Sensation intact, DTR normal. Strength 5/5 x all 4 extremities.  Psychiatric: Normal judgment and insight. Alert and oriented x 3. Normal mood.    Labs on Admission: I have personally reviewed following labs and imaging studies  CBC:  Recent Labs Lab 03/21/16 1218  WBC 11.2*  NEUTROABS 8.8*  HGB 12.9  HCT 44.6  MCV 91.2  PLT [id number]*   Basic Metabolic Panel:  Recent Labs Lab 03/21/16 1218  NA 141  K 4.8  CL 98*  CO2 34*  GLUCOSE 111*  BUN 14  CREATININE 0.73  CALCIUM 9.0   GFR: CrCl cannot be calculated (Unknown ideal weight.). Liver Function Tests:  Recent Labs Lab 03/21/16 1218  AST 28  ALT 18  ALKPHOS 62  BILITOT 1.0  PROT 6.1*  ALBUMIN 2.9*   No results for input(s): LIPASE, AMYLASE in the last 168 hours. No results for input(s): AMMONIA in the last 168 hours. Coagulation Profile: No results for input(s): INR, PROTIME in the last 168 hours. Cardiac Enzymes: No results for input(s): CKTOTAL, CKMB, CKMBINDEX, TROPONINI in the last 168 hours. BNP (last 3 results) No results for input(s): PROBNP in the last 8760 hours. HbA1C: No results for input(s): HGBA1C in the last 72 hours. CBG: No results for input(s): GLUCAP in the last 168 hours. Lipid Profile: No results for input(s): CHOL, HDL, LDLCALC, TRIG, CHOLHDL, LDLDIRECT in the last 72 hours. Thyroid  Function Tests: No results for input(s): TSH, T4TOTAL, FREET4, T3FREE, THYROIDAB in the last 72 hours. Anemia Panel: No results for input(s): VITAMINB12, FOLATE, FERRITIN, TIBC, IRON, RETICCTPCT in the last 72 hours. Urine analysis:    Component Value Date/Time   COLORURINE YELLOW 12/11/2015 0652   APPEARANCEUR CLOUDY (A) 12/11/2015 0652   LABSPEC 1.017 12/11/2015 0652   PHURINE 6.0 12/11/2015 0652   GLUCOSEU NEGATIVE 12/11/2015 0652   HGBUR LARGE (A) 12/11/2015 0652   BILIRUBINUR NEGATIVE 12/11/2015 0652   KETONESUR 15 (A) 12/11/2015 0652   PROTEINUR NEGATIVE 12/11/2015 0652   UROBILINOGEN 1.0 11/23/2014 0249   NITRITE NEGATIVE 12/11/2015 0652   LEUKOCYTESUR LARGE (A) 12/11/2015 0652   Sepsis Labs: @LABRCNTIP (procalcitonin:4,lacticidven:4) )No results found for this or any previous visit (from the past 240 hour(s)).   Radiological Exams on Admission: No results found.   Assessment/Plan Principal Problem:   Rectal bleeding -Patient presents with episodic rectal bleeding, some of the bleeding associated with yellow mucoid stool, waxing and waning for about a week according to the patient -Associated with epigastric pain as well -Source could be either upper or lower although BUN is normal which makes it less likely upper source -Hold Pradaxa -Hold Fosamax -Patient was eating a sandwich upon my entry into the room so I allowed her to complete eating a sandwich and it made her NPO -GI evaluation pending -Continue Protonix and add Pepcid -H. pylori serologies  Active Problems:   Microcytic anemia -Hemoglobin stable and at baseline 12.9 -Repeat CBC now and again in a.m.   Chronic thrombocytopenia -Platelets are stable and at baseline with current reading 145,000    Atrial fibrillation  -Not on telemetry at time of my evaluation but pulse regular on clinical exam -Anticoagulation on hold in setting of bleeding -CHADVASc=3 -Continue digoxin -Holding beta blocker/ostium  channel blocker during acute bleeding especially with suboptimal blood  pressures at presentation    Chronic respiratory failure with hypoxia /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 -On chronic oxygen 3 L/m -While in ER, oxygen inadvertently discontinued and patient noted to desaturate into the 70s -Continue preadmission Xopenex nebs    Essential hypertension -Lopressor and Cardizem on hold to suboptimal blood pressure    Chronic diastolic congestive heart failure -Holding Lasix in setting of acute GI bleeding as well as suboptimal blood pressures    HLD (hyperlipidemia) -Pre-admit meds on hold while NPO    GERD (gastroesophageal reflux disease) -Continue Protonix -Pepcid added      DVT prophylaxis: SCDs Code Status: Full Family Communication: No family at bedside  Disposition Plan: Back to skilled nursing facility when medically stable Consults called: Gastroenterology/[facility]    Samella Parr ANP-BC [facility] Pager [phone]   If 7PM-7AM, please contact night-coverage [email or web address] Password [email or web address]  03/21/2016, 3:04 PM

## 2016-03-21 NOTE — ED Triage Notes (Signed)
Patient from [facility] (SNF) after staff noticed bright red blood in the patient's stool.  No other complaints at this time.

## 2016-03-21 NOTE — ED Provider Notes (Addendum)
[facility] DEPT Provider Note   CSN: [id number] Arrival date & time: 03/21/16  1159     History   Chief Complaint Chief Complaint  Patient presents with  . GI Bleeding    HPI [name] [name] is a 76 y.o. female.  76 year old female presents after having bright red blood per rectum when she had a bowel movement today. Has some slight abdominal cramping but denies any fever or vomiting. Patient does take pradaxa for history of atrial fibrillation. She herself denies any weakness. Denies any history of upper GI bleeding. EMS called and patient transported here. No treatment use prior to arrival      Past Medical History:  Diagnosis Date  . Anemia   . Arthritis    "back" (11/21/2014)  . Asthma   . Blood dyscrasia    hx blood clotts  . Chronic bronchitis ([place])   . Chronic kidney disease   . Chronic lower back pain   . 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 GERD (gastroesophageal reflux disease)   . [id number])    "a few times/year" (11/21/2014)  . History of blood transfusion X 2   "related to blood thinner I was taking"  . Hyperlipidemia   . Hypertension   . Migraine    "weekly probably" (11/21/2014)  . On home oxygen therapy    "3L; 24/7" (11/21/2014)  . Pneumonia    hx pneumonia   . Pneumothorax on right 09/2014  . Pulmonary embolism ([place])   . Shortness of breath   . Urinary incontinence     Patient Active Problem List   Diagnosis Date Noted  . Malnutrition of moderate degree 01/18/2016  . Acute exacerbation of chronic obstructive pulmonary disease (COPD) ([employer]) 01/17/2016  . Chronic respiratory failure with hypoxia ([place]) 01/17/2016  . Pleural effusion 01/17/2016  . Atrial fibrillation with RVR ([place]) 12/10/2015  . Chronic diastolic congestive heart failure ([place]) 12/10/2015  . Dysuria 12/10/2015  . HLD (hyperlipidemia) 12/10/2015  . Depression (emotion) 12/10/2015  . GERD (gastroesophageal reflux disease) 12/10/2015  .  Pneumothorax, right 11/20/2014  . Pneumothorax   . Recurrent spontaneous pneumothorax   . Pneumothorax on right 10/09/2014  . Pneumothorax on left 09/14/2014  . Essential hypertension   . Microcytic anemia 04/27/2014  . Anemia due to acute blood loss 08/03/2013  . Paroxysmal atrial fibrillation ([place]) 02/03/2012  . Fracture of right hip ([place]) 01/29/2012  . Fall due to stumbling 01/29/2012  . CAD (coronary artery disease) 01/29/2012  . COPD (chronic obstructive pulmonary disease) ([place]) 01/29/2012  . Osteoarthritis 01/29/2012  . Closed fracture of right distal radius 10/13/2011    Class: Acute    Past Surgical History:  Procedure Laterality Date  . ABDOMINAL HYSTERECTOMY    . CATARACT EXTRACTION W/ INTRAOCULAR LENS  IMPLANT, BILATERAL Bilateral   . CYSTOSCOPY W/ RETROGRADES Bilateral 06/11/2014   Procedure: CYSTOSCOPY WITH RETROGRADE PYELOGRAM;  Surgeon: [name], MD;  Location: [facility];  Service: Urology;  Laterality: Bilateral;  . FRACTURE SURGERY    . HARDWARE REMOVAL  01/29/2012   Procedure: HARDWARE REMOVAL;  Surgeon: [name], MD;  Location: [facility];  Service: Orthopedics;  Laterality: Right;  . HEMIARTHROPLASTY SHOULDER FRACTURE Right   . HIP ARTHROPLASTY  01/29/2012   Procedure: ARTHROPLASTY BIPOLAR HIP;  Surgeon: [name], MD;  Location: [facility];  Service: Orthopedics;  Laterality: Right;  . LAPAROSCOPIC CHOLECYSTECTOMY    . ORIF WRIST FRACTURE  10/13/2011   Procedure:  OPEN REDUCTION INTERNAL FIXATION (ORIF) WRIST FRACTURE;  Surgeon: [name], MD;  Location: [facility];  Service: Orthopedics;  Laterality: Right;  Open Reduction Internal Fixation Right Distal Radius   . TRANSURETHRAL RESECTION OF BLADDER TUMOR N/A 06/11/2014   Procedure: TRANSURETHRAL RESECTION OF BLADDER TUMOR (TURBT);  Surgeon: [name], MD;  Location: [facility];  Service: Urology;  Laterality: N/A;  . TUBAL LIGATION      OB History    No data available       Home Medications    Prior to  Admission medications   Medication Sig Start Date End Date Taking? Authorizing Provider  alendronate (FOSAMAX) 70 MG tablet Take 70 mg by mouth once a week. Take with a full glass of water on an empty stomach.    Historical Provider, MD  Amino Acids-Protein Hydrolys (FEEDING SUPPLEMENT, PRO-STAT SUGAR FREE 64,) LIQD Take 30 mLs by mouth 2 (two) times daily.    Historical Provider, MD  atorvastatin (LIPITOR) 40 MG tablet Take 40 mg by mouth every morning.  07/02/13   Historical Provider, MD  calcitonin, salmon, (MIACALCIN/FORTICAL) 200 UNIT/ACT nasal spray Place 1 spray into alternate nostrils daily. 12/08/15   Historical Provider, MD  Cranberry-Vitamin C-Probiotic (AZO CRANBERRY PO) Take 2 capsules by mouth daily.    Historical Provider, MD  cyanocobalamin 500 MCG tablet Take 500 mcg by mouth daily.    Historical Provider, MD  dabigatran (PRADAXA) 150 MG CAPS capsule Take 150 mg by mouth 2 (two) times daily.    Historical Provider, MD  digoxin (LANOXIN) 0.125 MG tablet Take 1 tablet (0.125 mg total) by mouth daily. 12/17/15   Verlee Monte, MD  diltiazem (CARDIZEM LA) 240 MG 24 hr tablet Take 1 tablet (240 mg total) by mouth daily. 01/22/16   Reyne Dumas, MD  docusate sodium (COLACE) 100 MG capsule Take 1 capsule (100 mg total) by mouth every 12 (twelve) hours. 10/21/15   Tanna Furry, MD  ferrous sulfate 325 (65 FE) MG tablet Take 325 mg by mouth daily with breakfast.    Historical Provider, MD  fluticasone (FLONASE) 50 MCG/ACT nasal spray Place 1 spray into both nostrils daily as needed for allergies or rhinitis. 08/06/13   Barton Dubois, MD  furosemide (LASIX) 40 MG tablet Take 1 tablet (40 mg total) by mouth daily. 02/16/16   Dinah C Ngetich, NP  guaiFENesin (MUCINEX) 600 MG 12 hr tablet Take 2 tablets (1,200 mg total) by mouth 2 (two) times daily. 12/16/15   Verlee Monte, MD  HYDROcodone-acetaminophen (NORCO/VICODIN) 5-325 MG tablet Take 1 tablet by mouth every 4 (four) hours as needed. 01/22/16   Reyne Dumas, MD  levalbuterol (XOPENEX) 1.25 MG/3ML nebulizer solution Take 1.25 mg by nebulization every 4 (four) hours as needed for wheezing. 01/22/16   Reyne Dumas, MD  metoprolol (LOPRESSOR) 50 MG tablet Take 1 tablet (50 mg total) by mouth 2 (two) times daily. 12/16/15   Verlee Monte, MD  Multiple Vitamins-Minerals (MULTIVITAMIN WITH MINERALS) tablet Take 1 tablet by mouth daily.    Historical Provider, MD  Omega-3 Fatty Acids (FISH OIL) 1000 MG CAPS Take 1,000 mg by mouth daily.     Historical Provider, MD  oxybutynin (DITROPAN-XL) 5 MG 24 hr tablet Take 10 mg by mouth at bedtime.    Historical Provider, MD  pantoprazole (PROTONIX) 40 MG tablet Take 1 tablet (40 mg total) by mouth daily. [id number]   Delora Fuel, MD  sertraline (ZOLOFT) 25 MG tablet Take 1 tablet (25 mg total) by  mouth at bedtime. 09/24/15   Juliet Rude, MD  tiotropium (SPIRIVA) 18 MCG inhalation capsule Place 1 capsule (18 mcg total) into inhaler and inhale daily. 08/06/13   Barton Dubois, MD  tiZANidine (ZANAFLEX) 2 MG tablet Take 2 mg by mouth at bedtime.    Historical Provider, MD    Family History Family History  Problem Relation Age of Onset  . Cancer - Lung Mother   . Coronary artery disease Mother   . Coronary artery disease Sister   . Coronary artery disease Brother     Social History Social History  Substance Use Topics  . Smoking status: Former Smoker    Packs/day: 1.00    Years: 56.00    Types: Cigarettes    Quit date: 02/12/2014  . Smokeless tobacco: Never Used  . Alcohol use No     Allergies   Patient has no known allergies.   Review of Systems Review of Systems  All other systems reviewed and are negative.    Physical Exam Updated Vital Signs BP 115/64 (BP Location: Right Arm)   Pulse 84   Temp 97.4 F (36.3 C) (Oral)   Resp 16   SpO2 98%   Physical Exam  Constitutional: She is oriented to person, place, and time. She appears well-developed and well-nourished.  Non-toxic appearance. No  distress.  HENT:  Head: Normocephalic and atraumatic.  Eyes: Conjunctivae, EOM and lids are normal. Pupils are equal, round, and reactive to light.  Neck: Normal range of motion. Neck supple. No tracheal deviation present. No thyroid mass present.  Cardiovascular: Normal rate, regular rhythm and normal heart sounds.  Exam reveals no gallop.   No murmur heard. Pulmonary/Chest: Effort normal and breath sounds normal. No stridor. No respiratory distress. She has no decreased breath sounds. She has no wheezes. She has no rhonchi. She has no rales.  Abdominal: Soft. Normal appearance and bowel sounds are normal. She exhibits no distension. There is no tenderness. There is no rebound and no CVA tenderness.  Genitourinary: Rectal exam shows no external hemorrhoid and no tenderness.  Genitourinary Comments: No melanoic stools  Musculoskeletal: Normal range of motion. She exhibits no edema or tenderness.  Neurological: She is alert and oriented to person, place, and time. She has normal strength. No cranial nerve deficit or sensory deficit. GCS eye subscore is 4. GCS verbal subscore is 5. GCS motor subscore is 6.  Skin: Skin is warm and dry. No abrasion and no rash noted.  Psychiatric: She has a normal mood and affect. Her speech is normal and behavior is normal.  Nursing note and vitals reviewed.    ED Treatments / Results  Labs (all labs ordered are listed, but only abnormal results are displayed) Labs Reviewed - No data to display  EKG  EKG Interpretation None       Radiology No results found.  Procedures Procedures (including critical care time)  Medications Ordered in ED Medications - No data to display   Initial Impression / Assessment and Plan / ED Course  I have reviewed the triage vital signs and the nursing notes.  Pertinent labs & imaging results that were available during my care of the patient were reviewed by me and considered in my medical decision making (see chart  for details).  Clinical Course     Patient with guaiac positive stools here. Patient does take pradaxa. Will consult GI. Have spoke with hospitalist and they limit the patient  Final Clinical Impressions(s) / ED Diagnoses  Final diagnoses:  None   2:50 PM Spoke with dr Carlean Purl, Fabienne Bruns, will come see pt New Prescriptions New Prescriptions   No medications on file     Lacretia Leigh, MD 03/21/16 [place], MD 03/21/16 1451

## 2016-03-21 NOTE — ED Notes (Signed)
Attemped to obtain IV access x3.

## 2016-03-22 DIAGNOSIS — J449 Chronic obstructive pulmonary disease, unspecified: Secondary | ICD-10-CM | POA: Diagnosis not present

## 2016-03-22 DIAGNOSIS — I10 Essential (primary) hypertension: Secondary | ICD-10-CM | POA: Diagnosis not present

## 2016-03-22 DIAGNOSIS — K625 Hemorrhage of anus and rectum: Secondary | ICD-10-CM | POA: Diagnosis not present

## 2016-03-22 LAB — CBC
HCT: 41 % (ref 36.0–46.0)
Hemoglobin: 11.9 g/dL — ABNORMAL LOW (ref 12.0–15.0)
MCH: 26.9 pg (ref 26.0–34.0)
MCHC: 29 g/dL — AB (ref 30.0–36.0)
MCV: 92.6 fL (ref 78.0–100.0)
PLATELETS: 108 10*3/uL — AB (ref 150–400)
RBC: 4.43 MIL/uL (ref 3.87–5.11)
RDW: 16.8 % — ABNORMAL HIGH (ref 11.5–15.5)
WBC: 8.4 10*3/uL (ref 4.0–10.5)

## 2016-03-22 LAB — COMPREHENSIVE METABOLIC PANEL
ALK PHOS: 55 U/L (ref 38–126)
ALT: 17 U/L (ref 14–54)
ANION GAP: 8 (ref 5–15)
AST: 22 U/L (ref 15–41)
Albumin: 2.7 g/dL — ABNORMAL LOW (ref 3.5–5.0)
BILIRUBIN TOTAL: 0.5 mg/dL (ref 0.3–1.2)
BUN: 11 mg/dL (ref 6–20)
CALCIUM: 8.7 mg/dL — AB (ref 8.9–10.3)
CO2: 33 mmol/L — ABNORMAL HIGH (ref 22–32)
CREATININE: 0.56 mg/dL (ref 0.44–1.00)
Chloride: 100 mmol/L — ABNORMAL LOW (ref 101–111)
GFR calc non Af Amer: >60 mL/min (ref >60)
Glucose, Bld: 94 mg/dL (ref 65–99)
Potassium: 3.7 mmol/L (ref 3.5–5.1)
Sodium: 141 mmol/L (ref 135–145)
TOTAL PROTEIN: 5.5 g/dL — AB (ref 6.5–8.1)

## 2016-03-22 MED ORDER — LEVALBUTEROL HCL 1.25 MG/0.5ML IN NEBU
1.2500 mg | INHALATION_SOLUTION | Freq: Once | RESPIRATORY_TRACT | Status: AC
Start: 2016-03-22 — End: 2016-03-22
  Administered 2016-03-22: 1.25 mg via RESPIRATORY_TRACT

## 2016-03-22 MED ORDER — LEVALBUTEROL HCL 1.25 MG/0.5ML IN NEBU
1.2500 mg | INHALATION_SOLUTION | Freq: Three times a day (TID) | RESPIRATORY_TRACT | Status: DC
  Administered 2016-03-22 – 2016-03-27 (×15): 1.25 mg via RESPIRATORY_TRACT
  Filled 2016-03-22 (×15): qty 0.5

## 2016-03-22 NOTE — Care Management Obs Status (Signed)
Central NOTIFICATION   Patient Details  Name: [name] [name] MRN: [id number] Date of Birth: April 21, 1940   Medicare Observation Status Notification Given:  Yes    Chaneka Trefz, Rory Percy, RN 03/22/2016, 12:25 PM

## 2016-03-22 NOTE — Progress Notes (Signed)
CCMD called reporting patients heart rate jumped the 120's, checked on patient who was alert and oriented with no complaints. Said she had just been coughing quite a bit.

## 2016-03-22 NOTE — Progress Notes (Signed)
PROGRESS NOTE    STARRLA METOXEN  [id number] DOB: 05/23/1940 DOA: 03/21/2016 PCP: Blanchie Serve, MD   Outpatient Specialists:     Brief Narrative:  [name] [name] is a 76 y.o. female with medical history significant for chronic hypoxemia secondary to COPD on 3 L nasal cannula oxygen, atrial fibrillation on Pradaxa, microcytic anemia, hypertension, dyslipidemia, GERD, running diastolic heart failure. Patient has also had issues with recurrent spontaneous pneumothorax in the past. Patient was sent to the ER from the nursing facility today after staff noticed bright red blood in the patient's stool-it was blood mixed with yellow stool. In discussing with the patient, she's had one week of epigastric abdominal pain. About a week ago she noticed some blood in her stools. Today she has had 2 stools that she knows of associated with bleeding. She has not had any fevers or chills. Of note she takes Fosamax regularly. She has not been prescribed NSAIDs. And as noted she takes Pradaxa.   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   Paroxysmal atrial fibrillation (HCC)   Microcytic anemia   Essential hypertension   Atrial fibrillation (HCC)   Chronic diastolic congestive heart failure (HCC)   HLD (hyperlipidemia)   GERD (gastroesophageal reflux disease)   Chronic respiratory failure with hypoxia (HCC)   Rectal bleeding -Patient presents with episodic rectal bleeding, some of the bleeding associated with yellow mucoid stool, waxing and waning for about a week according to the patient -Associated with epigastric pain as well -Source could be either upper or lower although BUN is normal which makes it less likely upper source -Hold Pradaxa -Hold Fosamax -GI evaluation pending by Dr. Collene Mares -Continue Protonix and add Pepcid -H. 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 -On chronic oxygen 3 L/m -While in ER, oxygen inadvertently discontinued and patient noted to desaturate into the 70s -Continue preadmission Xopenex nebs    Essential hypertension -Lopressor and Cardizem on hold to suboptimal blood pressure    Chronic diastolic congestive heart failure -Holding Lasix in setting of acute GI bleeding as well as suboptimal blood pressures    HLD (hyperlipidemia) -resume home meds    GERD (gastroesophageal reflux disease) -Continue Protonix -Pepcid added   From SNF- social work consult   DVT prophylaxis:  SCD's  Code Status: Full Code   Family Communication: Called son  Disposition Plan:     Consultants:  GI Sports coach)  Subjective: Pleasantly cofused  Objective: Vitals:   03/21/16 2124 03/22/16 0607 03/22/16 0858 03/22/16 0940  BP: 124/68 117/62 131/71   Pulse: 68 92 80 88  Resp: 15 16  18   Temp: 97.4 F (36.3 C) 97.4 F (36.3 C) 97.5 F (36.4 C)   TempSrc: Oral Oral    SpO2: 98% 99% 97% 98%  Weight: 58.1 kg (128 lb)       Intake/Output Summary (Last 24 hours) at 03/22/16 1354 Last data filed at 03/22/16 1107  Gross per 24 hour  Intake           1432.5 ml  Output  0 ml  Net           1432.5 ml   Filed Weights   03/21/16 2124  Weight: 58.1 kg (128 lb)    Examination:  General exam: Appears calm and comfortable - hard of hearing and speech difficult to understand due to missing teeth Respiratory system: Clear to auscultation. Respiratory effort normal. Cardiovascular system: iRR. No JVD, murmurs, rubs, gallops or clicks. No pedal  edema. Gastrointestinal system: Abdomen is nondistended, soft and nontender. No organomegaly or masses felt. Normal bowel sounds heard. Central nervous system: Alert     Data Reviewed: I have personally reviewed following labs and imaging studies  CBC:  Recent Labs Lab 03/21/16 1218 03/21/16 1705 03/22/16 0347  WBC 11.2* 11.3* 8.4  NEUTROABS 8.8*  --   --   HGB 12.9 12.5 11.9*  HCT 44.6 42.7 41.0  MCV 91.2 91.0 92.6  PLT 145* 145* [id number]*   Basic Metabolic Panel:  Recent Labs Lab 03/21/16 1218 03/21/16 1705 03/22/16 0347  NA 141  --  141  K 4.8  --  3.7  CL 98*  --  100*  CO2 34*  --  33*  GLUCOSE 111*  --  94  BUN 14  --  11  CREATININE 0.73  --  0.56  CALCIUM 9.0  --  8.7*  MG  --  1.8  --   PHOS  --  3.9  --    GFR: Estimated Creatinine Clearance: 50.5 mL/min (by C-G formula based on SCr of 0.56 mg/dL). Liver Function Tests:  Recent Labs Lab 03/21/16 1218 03/22/16 0347  AST 28 22  ALT 18 17  ALKPHOS 62 55  BILITOT 1.0 0.5  PROT 6.1* 5.5*  ALBUMIN 2.9* 2.7*   No results for input(s): LIPASE, AMYLASE in the last 168 hours. No results for input(s): AMMONIA in the last 168 hours. Coagulation Profile: No results for input(s): INR, PROTIME in the last 168 hours. Cardiac Enzymes: No results for input(s): CKTOTAL, CKMB, CKMBINDEX, TROPONINI in the last 168 hours. BNP (last 3 results) No results for input(s): PROBNP in the last 8760 hours. HbA1C: No results for input(s): HGBA1C in the last 72 hours. CBG: No results for input(s): GLUCAP in the last 168 hours. Lipid Profile: No results for input(s): CHOL, HDL, LDLCALC, TRIG, CHOLHDL, LDLDIRECT in the last 72 hours. Thyroid Function Tests: No results for input(s): TSH, T4TOTAL, FREET4, T3FREE, THYROIDAB in the last 72 hours. Anemia Panel: No results for input(s): VITAMINB12, FOLATE, FERRITIN, TIBC, IRON, RETICCTPCT in the last 72 hours. Urine analysis:    Component Value Date/Time   COLORURINE YELLOW  12/11/2015 0652   APPEARANCEUR CLOUDY (A) 12/11/2015 0652   LABSPEC 1.017 12/11/2015 0652   PHURINE 6.0 12/11/2015 0652   GLUCOSEU NEGATIVE 12/11/2015 0652   HGBUR LARGE (A) 12/11/2015 0652   BILIRUBINUR NEGATIVE 12/11/2015 0652   KETONESUR 15 (A) 12/11/2015 0652   PROTEINUR NEGATIVE 12/11/2015 0652   UROBILINOGEN 1.0 11/23/2014 0249   NITRITE NEGATIVE 12/11/2015 0652   LEUKOCYTESUR LARGE (A) 12/11/2015 [id number]     )No results found for this or any previous visit (from the past 240 hour(s)).    Anti-infectives    None       Radiology Studies: No results found.      Scheduled Meds: . calcitonin (salmon)  1 spray Alternating Nares Daily  . digoxin  0.125 mg Oral Daily  . famotidine (PEPCID) IV  20 mg Intravenous Q12H  . oxybutynin  10 mg Oral  QHS  . pantoprazole  40 mg Oral Daily  . sertraline  25 mg Oral QHS  . sodium chloride flush  3 mL Intravenous Q12H  . tiotropium  18 mcg Inhalation Daily  . tiZANidine  2 mg Oral QHS   Continuous Infusions: . sodium chloride 75 mL/hr (03/21/16 1758)     LOS: 0 days    Time spent: 22 min    Norris, DO [facility] Pager [phone]  If 7PM-7AM, please contact night-coverage [email or web address] Password [email or web address] 03/22/2016, 1:54 PM

## 2016-03-22 NOTE — Progress Notes (Addendum)
UNASSIGNED PATIENT Subjective: Patient seen and examined. Chart reviewed. Patient claims she is very short of breath And she feels the oxygen test has been given to her by nasal cannula is not helping her symptoms at all. She does not feel she can do a prep for the colonoscopy at this time and she says she can barely breathe. She suffers from chronic constipation and has diffuse abdominal distention and pain intermittently occasionally she has noticed some bright blood in the stool when the stools are hard. Her shortness of breath is making her very anxious and she tells me "I'm paying for 40 years of smoking of done".   Objective: Vital signs in last 24 hours: Temp:  [97.4 F (36.3 C)-97.5 F (36.4 C)] 97.5 F (36.4 C) (01/08 0858) Pulse Rate:  [65-92] 88 (01/08 0940) Resp:  [15-18] 18 (01/08 0940) BP: (117-131)/(62-77) 131/71 (01/08 0858) SpO2:  [97 %-99 %] 98 % (01/08 0940) Weight:  [58.1 kg (128 lb)] 58.1 kg (128 lb) (01/07 2124) Last BM Date: 03/21/16  Intake/Output from previous day: 01/07 0701 - 01/08 0700 In: 1192.5 [P.O.:240; I.V.:902.5; IV Piggyback:50] Out: 0  Intake/Output this shift: Total I/O In: 240 [P.O.:240] Out: 0   General appearance: alert, cooperative, appears older than stated age, fatigued, moderate distress and pale. edentulous Resp: diminished breath sounds bilaterally Cardio: irregularly irregular, no murmur, click, rub or gallop GI: soft, slightly distended with hypoactive bowel sounds; no masses,  no organomegaly  Lab Results:  Recent Labs  03/21/16 1218 03/21/16 1705 03/22/16 0347  WBC 11.2* 11.3* 8.4  HGB 12.9 12.5 11.9*  HCT 44.6 42.7 41.0  PLT 145* 145* 108*   BMET  Recent Labs  03/21/16 1218 03/22/16 0347  NA 141 141  K 4.8 3.7  CL 98* 100*  CO2 34* 33*  GLUCOSE 111* 94  BUN 14 11  CREATININE 0.73 0.56  CALCIUM 9.0 8.7*   LFT  Recent Labs  03/22/16 0347  PROT 5.5*  ALBUMIN 2.7*  AST 22  ALT 17  ALKPHOS 55  BILITOT  0.5   Medications: I have reviewed the patient's current medications.  Assessment/Plan: 1) Rectal bleeding with microcytic anemia: patient has been receiving breathing treatments round the clock today. I think we should not attempt to sedate her any time soon in light of her advanced COPD with respiratory failure and pulmonary HTN. Dr. Benson Norway  will reevaluate her tomorrow. 2) GERD on PPI's and H2 blockers. 3) HTN/Chronic diastolic congestive heart failure.   LOS: 0 days   [name] [name] 03/22/2016, 1:26 PM

## 2016-03-23 ENCOUNTER — Observation Stay (HOSPITAL_COMMUNITY): Payer: Medicare HMO

## 2016-03-23 DIAGNOSIS — Z66 Do not resuscitate: Secondary | ICD-10-CM | POA: Diagnosis present

## 2016-03-23 DIAGNOSIS — I480 Paroxysmal atrial fibrillation: Secondary | ICD-10-CM | POA: Diagnosis present

## 2016-03-23 DIAGNOSIS — Z79899 Other long term (current) drug therapy: Secondary | ICD-10-CM | POA: Diagnosis not present

## 2016-03-23 DIAGNOSIS — I1310 Hypertensive heart and chronic kidney disease without heart failure, with stage 1 through stage 4 chronic kidney disease, or unspecified chronic kidney disease: Secondary | ICD-10-CM | POA: Diagnosis present

## 2016-03-23 DIAGNOSIS — K625 Hemorrhage of anus and rectum: Secondary | ICD-10-CM | POA: Diagnosis not present

## 2016-03-23 DIAGNOSIS — K922 Gastrointestinal hemorrhage, unspecified: Secondary | ICD-10-CM | POA: Diagnosis present

## 2016-03-23 DIAGNOSIS — I272 Pulmonary hypertension, unspecified: Secondary | ICD-10-CM | POA: Diagnosis present

## 2016-03-23 DIAGNOSIS — Z961 Presence of intraocular lens: Secondary | ICD-10-CM | POA: Diagnosis present

## 2016-03-23 DIAGNOSIS — I5031 Acute diastolic (congestive) heart failure: Secondary | ICD-10-CM | POA: Diagnosis not present

## 2016-03-23 DIAGNOSIS — J9611 Chronic respiratory failure with hypoxia: Secondary | ICD-10-CM | POA: Diagnosis present

## 2016-03-23 DIAGNOSIS — Z87891 Personal history of nicotine dependence: Secondary | ICD-10-CM | POA: Diagnosis not present

## 2016-03-23 DIAGNOSIS — D509 Iron deficiency anemia, unspecified: Secondary | ICD-10-CM | POA: Diagnosis present

## 2016-03-23 DIAGNOSIS — Z9842 Cataract extraction status, left eye: Secondary | ICD-10-CM | POA: Diagnosis not present

## 2016-03-23 DIAGNOSIS — R0602 Shortness of breath: Secondary | ICD-10-CM | POA: Diagnosis not present

## 2016-03-23 DIAGNOSIS — I509 Heart failure, unspecified: Secondary | ICD-10-CM | POA: Diagnosis not present

## 2016-03-23 DIAGNOSIS — I5032 Chronic diastolic (congestive) heart failure: Secondary | ICD-10-CM | POA: Diagnosis not present

## 2016-03-23 DIAGNOSIS — N189 Chronic kidney disease, unspecified: Secondary | ICD-10-CM | POA: Diagnosis present

## 2016-03-23 DIAGNOSIS — I10 Essential (primary) hypertension: Secondary | ICD-10-CM | POA: Diagnosis not present

## 2016-03-23 DIAGNOSIS — K921 Melena: Secondary | ICD-10-CM | POA: Diagnosis present

## 2016-03-23 DIAGNOSIS — Z91012 Allergy to eggs: Secondary | ICD-10-CM | POA: Diagnosis not present

## 2016-03-23 DIAGNOSIS — E785 Hyperlipidemia, unspecified: Secondary | ICD-10-CM | POA: Diagnosis present

## 2016-03-23 DIAGNOSIS — Z515 Encounter for palliative care: Secondary | ICD-10-CM | POA: Diagnosis present

## 2016-03-23 DIAGNOSIS — K219 Gastro-esophageal reflux disease without esophagitis: Secondary | ICD-10-CM | POA: Diagnosis present

## 2016-03-23 DIAGNOSIS — Z9981 Dependence on supplemental oxygen: Secondary | ICD-10-CM | POA: Diagnosis not present

## 2016-03-23 DIAGNOSIS — Z7983 Long term (current) use of bisphosphonates: Secondary | ICD-10-CM | POA: Diagnosis not present

## 2016-03-23 DIAGNOSIS — H9190 Unspecified hearing loss, unspecified ear: Secondary | ICD-10-CM | POA: Diagnosis present

## 2016-03-23 DIAGNOSIS — D696 Thrombocytopenia, unspecified: Secondary | ICD-10-CM | POA: Diagnosis present

## 2016-03-23 DIAGNOSIS — J449 Chronic obstructive pulmonary disease, unspecified: Secondary | ICD-10-CM | POA: Diagnosis not present

## 2016-03-23 DIAGNOSIS — Z9841 Cataract extraction status, right eye: Secondary | ICD-10-CM | POA: Diagnosis not present

## 2016-03-23 DIAGNOSIS — Z8249 Family history of ischemic heart disease and other diseases of the circulatory system: Secondary | ICD-10-CM | POA: Diagnosis not present

## 2016-03-23 DIAGNOSIS — I5033 Acute on chronic diastolic (congestive) heart failure: Secondary | ICD-10-CM | POA: Diagnosis present

## 2016-03-23 DIAGNOSIS — I4891 Unspecified atrial fibrillation: Secondary | ICD-10-CM | POA: Diagnosis not present

## 2016-03-23 LAB — CBC
HCT: 44 % (ref 36.0–46.0)
Hemoglobin: 12.8 g/dL (ref 12.0–15.0)
MCH: 26.8 pg (ref 26.0–34.0)
MCHC: 29.1 g/dL — ABNORMAL LOW (ref 30.0–36.0)
MCV: 92.2 fL (ref 78.0–100.0)
PLATELETS: 133 10*3/uL — AB (ref 150–400)
RBC: 4.77 MIL/uL (ref 3.87–5.11)
RDW: 16.3 % — AB (ref 11.5–15.5)
WBC: 13.6 10*3/uL — AB (ref 4.0–10.5)

## 2016-03-23 LAB — H PYLORI, IGM, IGG, IGA AB
H Pylori IgG: <0.9 U/mL (ref 0.0–0.8)
H. Pylogi, Iga Abs: 11.8 units — ABNORMAL HIGH (ref 0.0–8.9)
H. Pylogi, Igm Abs: <9 units (ref 0.0–8.9)

## 2016-03-23 MED ORDER — FUROSEMIDE 40 MG PO TABS: 60.0000 mg | ORAL_TABLET | Freq: Every day | ORAL | Status: DC

## 2016-03-23 MED ORDER — FUROSEMIDE 10 MG/ML IJ SOLN
20.0000 mg | Freq: Once | INTRAMUSCULAR | Status: AC
  Administered 2016-03-23: 20 mg via INTRAVENOUS
  Filled 2016-03-23: qty 2

## 2016-03-23 MED ORDER — DILTIAZEM HCL ER COATED BEADS 240 MG PO CP24
240.0000 mg | ORAL_CAPSULE | Freq: Every day | ORAL | Status: DC
  Administered 2016-03-23: 240 mg via ORAL
  Filled 2016-03-23 (×2): qty 2
  Filled 2016-03-23 (×2): qty 1

## 2016-03-23 MED ORDER — METOPROLOL TARTRATE 50 MG PO TABS
50.0000 mg | ORAL_TABLET | Freq: Two times a day (BID) | ORAL | Status: DC
  Administered 2016-03-23 – 2016-03-27 (×9): 50 mg via ORAL
  Filled 2016-03-23 (×9): qty 1

## 2016-03-23 MED ORDER — FUROSEMIDE 10 MG/ML IJ SOLN
40.0000 mg | Freq: Every day | INTRAMUSCULAR | Status: DC
  Administered 2016-03-23: 40 mg via INTRAVENOUS
  Filled 2016-03-23: qty 4

## 2016-03-23 NOTE — Progress Notes (Signed)
Subjective: No complaints of hematochezia, but she is still SOB.  Objective: Vital signs in last 24 hours: Temp:  [97.5 F (36.4 C)-98.4 F (36.9 C)] 98.4 F (36.9 C) (01/09 1704) Pulse Rate:  [73-129] 80 (01/09 1704) BP: (123-182)/(63-124) 135/71 (01/09 1704) SpO2:  [96 %-99 %] 98 % (01/09 1704) Weight:  [60 kg (132 lb 4.4 oz)] 60 kg (132 lb 4.4 oz) (01/09 0630) Last BM Date: 03/21/16  Intake/Output from previous day: 01/08 0701 - 01/09 0700 In: 2785 [P.O.:960; I.V.:1725; IV Piggyback:100] Out: 0  Intake/Output this shift: Total I/O In: 363 [P.O.:360; I.V.:3] Out: -   General appearance: alert and no distress GI: soft, non-tender; bowel sounds normal; no masses,  no organomegaly  Lab Results:  Recent Labs  03/21/16 1705 03/22/16 0347 03/23/16 0900  WBC 11.3* 8.4 13.6*  HGB 12.5 11.9* 12.8  HCT 42.7 41.0 44.0  PLT 145* 108* 133*   BMET  Recent Labs  03/21/16 1218 03/22/16 0347  NA 141 141  K 4.8 3.7  CL 98* 100*  CO2 34* 33*  GLUCOSE 111* 94  BUN 14 11  CREATININE 0.73 0.56  CALCIUM 9.0 8.7*   LFT  Recent Labs  03/22/16 0347  PROT 5.5*  ALBUMIN 2.7*  AST 22  ALT 17  ALKPHOS 55  BILITOT 0.5   PT/INR No results for input(s): LABPROT, INR in the last 72 hours. Hepatitis Panel No results for input(s): HEPBSAG, HCVAB, HEPAIGM, HEPBIGM in the last 72 hours. C-Diff No results for input(s): CDIFFTOX in the last 72 hours. Fecal Lactopherrin No results for input(s): FECLLACTOFRN in the last 72 hours.  Studies/Results: Dg Chest Port 1 View  Result Date: 03/23/2016 CLINICAL DATA:  Shortness breath.  Mid chest discomfort. EXAM: PORTABLE CHEST 1 VIEW COMPARISON:  Two-view chest x-ray 01/20/2016 FINDINGS: The heart is mildly enlarged. Atherosclerotic calcifications are present at the arch. There is continued increase in diffuse interstitial edema and bilateral effusions. Bilateral lower lobe airspace disease is evident. IMPRESSION: 1. Increasing  interstitial edema and bilateral pleural effusions compatible with worsening congestive heart failure. 2. Bibasilar airspace disease likely reflects atelectasis. 3. Aortic atherosclerosis. Electronically Signed   By: San Morelle M.D.   On: 03/23/2016 13:27    Medications:  Scheduled: . calcitonin (salmon)  1 spray Alternating Nares Daily  . digoxin  0.125 mg Oral Daily  . diltiazem  240 mg Oral Daily  . famotidine (PEPCID) IV  20 mg Intravenous Q12H  . furosemide  40 mg Intravenous Daily  . levalbuterol  1.25 mg Nebulization TID  . metoprolol  50 mg Oral BID  . oxybutynin  10 mg Oral QHS  . pantoprazole  40 mg Oral Daily  . sertraline  25 mg Oral QHS  . sodium chloride flush  3 mL Intravenous Q12H  . tiotropium  18 mcg Inhalation Daily  . tiZANidine  2 mg Oral QHS   Continuous:   Assessment/Plan: 1) Hematochezia. 2) Severe COPD.   Her HGB is stable and she does not have any further hematochezia.  A colonoscopy is appropriate for work up, but her pulmonary status is poor.  She is too high risk for the procedure.  She states that even with our conversation she is SOB speaking.  Currently she is on 3-3.5 liters of O2 saturating at 97%, but she still reports feeling SOB.  At this point, I think the risk of complications with a colonoscopy is too high in relation to her pulmonary status and current HGB.  Plan:  1) No further GI evaluation at this time. 2) Signing off.  LOS: 0 days   Natelie Ostrosky D 03/23/2016, 6:11 PM

## 2016-03-23 NOTE — Progress Notes (Addendum)
PROGRESS NOTE    [name] [name]  [id number] DOB: Mar 05, 1941 DOA: 03/21/2016 PCP: Blanchie Serve, MD   Outpatient Specialists:     Brief Narrative:  [name] [name] is a 76 y.o. female with medical history significant for chronic hypoxemia secondary to COPD on 3 L nasal cannula oxygen, atrial fibrillation on Pradaxa, microcytic anemia, hypertension, dyslipidemia, GERD, running diastolic heart failure. Patient has also had issues with recurrent spontaneous pneumothorax in the past. Patient was sent to the ER from the nursing facility today after staff noticed bright red blood in the patient's stool-it was blood mixed with yellow stool. In discussing with the patient, she's had one week of epigastric abdominal pain. About a week ago she noticed some blood in her stools. Today she has had 2 stools that she knows of associated with bleeding. She has not had any fevers or chills. Of note she takes Fosamax regularly. She has not been prescribed NSAIDs. And as noted she takes Pradaxa.   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   Paroxysmal atrial fibrillation (HCC)   Microcytic anemia   Essential hypertension   Atrial fibrillation (HCC)   Chronic diastolic congestive heart failure (HCC)   HLD (hyperlipidemia)   GERD (gastroesophageal reflux disease)   Chronic respiratory failure with hypoxia (HCC)   Rectal bleeding -Patient presents with episodic rectal bleeding, some of the bleeding associated with yellow mucoid stool, waxing and waning for about a week according to the patient -Associated with epigastric pain as well -Source could be either upper or lower although BUN is normal which makes it less likely upper source -Hold Pradaxa -Hold Fosamax -GI evaluation pending-- no recent bleeding -Continue Protonix and add Pepcid  Shortness of breath due to pulm edema -chest x ray  shows pulm edema -IV lasix daily    Microcytic anemia -monitor H/H   Chronic thrombocytopenia -Platelets are stable     Atrial fibrillation  -Anticoagulation on hold in setting of bleeding -CHADVASc=3 -Continue digoxin    Chronic respiratory failure with hypoxia /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 -On chronic oxygen 3 L/m -When in ER, oxygen inadvertently discontinued and patient noted to desaturate into the 70s -Continue preadmission Xopenex nebs    Essential hypertension -Lopressor resume    Chronic diastolic congestive heart failure -resume lasix    HLD (hyperlipidemia) -resume home meds    GERD (gastroesophageal reflux disease) -Continue Protonix -Pepcid added   From SNF- social work consult   DVT prophylaxis:  SCD's  Code Status: Full Code   Family Communication: Called son 1/8  Disposition Plan:     Consultants:  GI ([place])  Subjective: C/o SOB  Objective: Vitals:   03/23/16 0700 03/23/16 0901 03/23/16 0903 03/23/16 1151  BP: 132/79     Pulse: 100   (!) 106  Resp:      Temp: 98.1 F (36.7 C)     TempSrc: Oral     SpO2: 98% 97% 97%   Weight:        Intake/Output Summary (Last 24 hours) at 03/23/16 1301 Last data filed at 03/23/16 1208  Gross per 24 hour  Intake             2548 ml  Output                0 ml  Net  2548 ml   Filed Weights   03/21/16 2124 03/23/16 0630  Weight: 58.1 kg (128 lb) 60 kg (132 lb 4.4 oz)    Examination:  General exam: Appears calm and comfortable - hard of hearing and speech difficult to understand due to missing teeth Respiratory system: no wheezing Cardiovascular system: iRR. No JVD, murmurs, rubs, gallops or clicks. No pedal edema. Gastrointestinal system: Abdomen is nondistended, soft and nontender. No organomegaly or masses felt. Normal bowel sounds heard. Central nervous system: Alert     Data Reviewed: I have personally reviewed  following labs and imaging studies  CBC:  Recent Labs Lab 03/21/16 1218 03/21/16 1705 03/22/16 0347 03/23/16 0900  WBC 11.2* 11.3* 8.4 13.6*  NEUTROABS 8.8*  --   --   --   HGB 12.9 12.5 11.9* 12.8  HCT 44.6 42.7 41.0 44.0  MCV 91.2 91.0 92.6 92.2  PLT 145* 145* 108* [id number]*   Basic Metabolic Panel:  Recent Labs Lab 03/21/16 1218 03/21/16 1705 03/22/16 0347  NA 141  --  141  K 4.8  --  3.7  CL 98*  --  100*  CO2 34*  --  33*  GLUCOSE 111*  --  94  BUN 14  --  11  CREATININE 0.73  --  0.56  CALCIUM 9.0  --  8.7*  MG  --  1.8  --   PHOS  --  3.9  --    GFR: Estimated Creatinine Clearance: 51.3 mL/min (by C-G formula based on SCr of 0.56 mg/dL). Liver Function Tests:  Recent Labs Lab 03/21/16 1218 03/22/16 0347  AST 28 22  ALT 18 17  ALKPHOS 62 55  BILITOT 1.0 0.5  PROT 6.1* 5.5*  ALBUMIN 2.9* 2.7*   No results for input(s): LIPASE, AMYLASE in the last 168 hours. No results for input(s): AMMONIA in the last 168 hours. Coagulation Profile: No results for input(s): INR, PROTIME in the last 168 hours. Cardiac Enzymes: No results for input(s): CKTOTAL, CKMB, CKMBINDEX, TROPONINI in the last 168 hours. BNP (last 3 results) No results for input(s): PROBNP in the last 8760 hours. HbA1C: No results for input(s): HGBA1C in the last 72 hours. CBG: No results for input(s): GLUCAP in the last 168 hours. Lipid Profile: No results for input(s): CHOL, HDL, LDLCALC, TRIG, CHOLHDL, LDLDIRECT in the last 72 hours. Thyroid Function Tests: No results for input(s): TSH, T4TOTAL, FREET4, T3FREE, THYROIDAB in the last 72 hours. Anemia Panel: No results for input(s): VITAMINB12, FOLATE, FERRITIN, TIBC, IRON, RETICCTPCT in the last 72 hours. Urine analysis:    Component Value Date/Time   COLORURINE YELLOW 12/11/2015 0652   APPEARANCEUR CLOUDY (A) 12/11/2015 0652   LABSPEC 1.017 12/11/2015 0652   PHURINE 6.0 12/11/2015 0652   GLUCOSEU NEGATIVE 12/11/2015 0652   HGBUR LARGE  (A) 12/11/2015 0652   BILIRUBINUR NEGATIVE 12/11/2015 0652   KETONESUR 15 (A) 12/11/2015 0652   PROTEINUR NEGATIVE 12/11/2015 0652   UROBILINOGEN 1.0 11/23/2014 0249   NITRITE NEGATIVE 12/11/2015 0652   LEUKOCYTESUR LARGE (A) 12/11/2015 [id number]     )No results found for this or any previous visit (from the past 240 hour(s)).    Anti-infectives    None       Radiology Studies: No results found.      Scheduled Meds: . calcitonin (salmon)  1 spray Alternating Nares Daily  . digoxin  0.125 mg Oral Daily  . famotidine (PEPCID) IV  20 mg Intravenous Q12H  . levalbuterol  1.25 mg Nebulization  TID  . oxybutynin  10 mg Oral QHS  . pantoprazole  40 mg Oral Daily  . sertraline  25 mg Oral QHS  . sodium chloride flush  3 mL Intravenous Q12H  . tiotropium  18 mcg Inhalation Daily  . tiZANidine  2 mg Oral QHS   Continuous Infusions:    LOS: 0 days    Time spent: 25 min    [place], DO [facility] Pager [phone]  If 7PM-7AM, please contact night-coverage [email or web address] Password [email or web address] 03/23/2016, 1:01 PM

## 2016-03-24 ENCOUNTER — Encounter (HOSPITAL_COMMUNITY): Payer: Self-pay | Admitting: General Practice

## 2016-03-24 DIAGNOSIS — J9611 Chronic respiratory failure with hypoxia: Secondary | ICD-10-CM

## 2016-03-24 DIAGNOSIS — I509 Heart failure, unspecified: Secondary | ICD-10-CM

## 2016-03-24 DIAGNOSIS — I5031 Acute diastolic (congestive) heart failure: Secondary | ICD-10-CM

## 2016-03-24 LAB — CBC
HEMATOCRIT: 43 % (ref 36.0–46.0)
Hemoglobin: 12.4 g/dL (ref 12.0–15.0)
MCH: 26.6 pg (ref 26.0–34.0)
MCHC: 28.8 g/dL — ABNORMAL LOW (ref 30.0–36.0)
MCV: 92.3 fL (ref 78.0–100.0)
PLATELETS: 128 10*3/uL — AB (ref 150–400)
RBC: 4.66 MIL/uL (ref 3.87–5.11)
RDW: 16.4 % — ABNORMAL HIGH (ref 11.5–15.5)
WBC: 11.7 10*3/uL — AB (ref 4.0–10.5)

## 2016-03-24 LAB — BASIC METABOLIC PANEL
Anion gap: 7 (ref 5–15)
BUN: 11 mg/dL (ref 6–20)
CHLORIDE: 102 mmol/L (ref 101–111)
CO2: 34 mmol/L — ABNORMAL HIGH (ref 22–32)
CREATININE: 0.62 mg/dL (ref 0.44–1.00)
Calcium: 8.4 mg/dL — ABNORMAL LOW (ref 8.9–10.3)
GFR calc Af Amer: >60 mL/min (ref >60)
GFR calc non Af Amer: >60 mL/min (ref >60)
Glucose, Bld: 92 mg/dL (ref 65–99)
POTASSIUM: 4.5 mmol/L (ref 3.5–5.1)
Sodium: 143 mmol/L (ref 135–145)

## 2016-03-24 LAB — MRSA PCR SCREENING: MRSA by PCR: NEGATIVE

## 2016-03-24 MED ORDER — FUROSEMIDE 40 MG PO TABS
40.0000 mg | ORAL_TABLET | Freq: Every day | ORAL | Status: DC
Start: 2016-03-24 — End: 2016-03-25
  Administered 2016-03-24: 40 mg via ORAL
  Filled 2016-03-24: qty 1

## 2016-03-24 MED ORDER — FUROSEMIDE 10 MG/ML IJ SOLN
40.0000 mg | Freq: Two times a day (BID) | INTRAMUSCULAR | Status: DC
  Filled 2016-03-24: qty 4

## 2016-03-24 NOTE — Progress Notes (Signed)
Pts. B/P 113/49 and HR 66. MD notified. Ordered to give metoprolol and D/C cardizem order. Orders followed. Will continue to monitor.

## 2016-03-24 NOTE — Progress Notes (Signed)
PROGRESS NOTE    OAKLEY LASEK  [id number] DOB: [birth year] DOA: 03/21/2016 PCP: Blanchie Serve, MD   Outpatient Specialists:     Brief Narrative:  [name] [name] is a 76 y.o. female with medical history significant for chronic hypoxemia secondary to COPD on 3 L nasal cannula oxygen, atrial fibrillation on Pradaxa, microcytic anemia, hypertension, dyslipidemia, GERD, running diastolic heart failure. Patient has also had issues with recurrent spontaneous pneumothorax in the past. Patient was sent to the ER from the nursing facility today after staff noticed bright red blood in the patient's stool-it was blood mixed with yellow stool. In discussing with the patient, she's had one week of epigastric abdominal pain. About a week ago she noticed some blood in her stools. Today she has had 2 stools that she knows of associated with bleeding. She has not had any fevers or chills. Of note she takes Fosamax regularly. She has not been prescribed NSAIDs. And as noted she takes Pradaxa.   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   Paroxysmal atrial fibrillation (HCC)   Microcytic anemia   Essential hypertension   Atrial fibrillation (HCC)   Chronic diastolic congestive heart failure (HCC)   HLD (hyperlipidemia)   GERD (gastroesophageal reflux disease)   Chronic respiratory failure with hypoxia (HCC)   Rectal bleeding -Patient presents with episodic rectal bleeding, some of the bleeding associated with yellow mucoid stool, waxing and waning for about a week according to the patient -Associated with epigastric pain as well -Source could be either upper or lower although BUN is normal which makes it less likely upper source -Hold Pradaxa -Hold Fosamax -GI evaluation pending-- no recent bleeding- no plan for intervention -Continue Protonix  Shortness of breath due to pulm  edema -chest x ray shows pulm edema -PO lasix at higher dose    Microcytic anemia -monitor H/H   Chronic thrombocytopenia -Platelets are stable     Atrial fibrillation  -Anticoagulation on hold in setting of bleeding -CHADVASc=3 -Continue digoxin    Chronic respiratory failure with hypoxia /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 -On chronic oxygen 3 L/m -When in ER, oxygen inadvertently discontinued and patient noted to desaturate into the 70s -Continue preadmission Xopenex nebs    Essential hypertension -Lopressor resume    Chronic diastolic congestive heart failure -resume lasix    HLD (hyperlipidemia) -resume home meds    GERD (gastroesophageal reflux disease) -Continue Protonix -Pepcid added   From SNF- social work consult   DVT prophylaxis:  SCD's  Code Status: Full Code   Family Communication: Called son 1/9  Disposition Plan:  SNF in AM   Consultants:  GI Collene Mares)  Subjective: Breathing improved  Objective: Vitals:   03/24/16 0659 03/24/16 0855 03/24/16 0904 03/24/16 1020  BP:  122/60  (!) 116/49  Pulse:  80  68  Resp:  20    Temp:  98.4 F (36.9 C)    TempSrc:  Oral    SpO2:  98% 96%   Weight: 60 kg (132 lb 4.4 oz)       Intake/Output Summary (Last 24 hours) at 03/24/16 1204 Last data filed at 03/24/16 1020  Gross per 24 hour  Intake              306 ml  Output                2 ml  Net              304 ml   Filed Weights   03/21/16 2124 03/23/16 0630 03/24/16 0659  Weight: 58.1 kg (128 lb) 60 kg (132 lb 4.4 oz) 60 kg (132 lb 4.4 oz)    Examination:  General exam: Appears calm and comfortable - hard of hearing and speech difficult to understand due to missing teeth Respiratory system: no wheezing Cardiovascular system: iRR. No JVD, murmurs, rubs, gallops or clicks. No pedal edema. Gastrointestinal system: Abdomen is nondistended, soft and nontender. No organomegaly or masses felt. Normal  bowel sounds heard. Central nervous system: Alert     Data Reviewed: I have personally reviewed following labs and imaging studies  CBC:  Recent Labs Lab 03/21/16 1218 03/21/16 1705 03/22/16 0347 03/23/16 0900 03/24/16 0545  WBC 11.2* 11.3* 8.4 13.6* 11.7*  NEUTROABS 8.8*  --   --   --   --   HGB 12.9 12.5 11.9* 12.8 12.4  HCT 44.6 42.7 41.0 44.0 43.0  MCV 91.2 91.0 92.6 92.2 92.3  PLT 145* 145* 108* 133* 0000000*   Basic Metabolic Panel:  Recent Labs Lab 03/21/16 1218 03/21/16 1705 03/22/16 0347 03/24/16 0545  NA 141  --  141 143  K 4.8  --  3.7 4.5  CL 98*  --  100* 102  CO2 34*  --  33* 34*  GLUCOSE 111*  --  94 92  BUN 14  --  11 11  CREATININE 0.73  --  0.56 0.62  CALCIUM 9.0  --  8.7* 8.4*  MG  --  1.8  --   --   PHOS  --  3.9  --   --    GFR: Estimated Creatinine Clearance: 51.3 mL/min (by C-G formula based on SCr of 0.62 mg/dL). Liver Function Tests:  Recent Labs Lab 03/21/16 1218 03/22/16 0347  AST 28 22  ALT 18 17  ALKPHOS 62 55  BILITOT 1.0 0.5  PROT 6.1* 5.5*  ALBUMIN 2.9* 2.7*   No results for input(s): LIPASE, AMYLASE in the last 168 hours. No results for input(s): AMMONIA in the last 168 hours. Coagulation Profile: No results for input(s): INR, PROTIME in the last 168 hours. Cardiac Enzymes: No results for input(s): CKTOTAL, CKMB, CKMBINDEX, TROPONINI in the last 168 hours. BNP (last 3 results) No results for input(s): PROBNP in the last 8760 hours. HbA1C: No results for input(s): HGBA1C in the last 72 hours. CBG: No results for input(s): GLUCAP in the last 168 hours. Lipid Profile: No results for input(s): CHOL, HDL, LDLCALC, TRIG, CHOLHDL, LDLDIRECT in the last 72 hours. Thyroid Function Tests: No results for input(s): TSH, T4TOTAL, FREET4, T3FREE, THYROIDAB in the last 72 hours. Anemia Panel: No results for input(s): VITAMINB12, FOLATE, FERRITIN, TIBC, IRON, RETICCTPCT in the last 72 hours. Urine analysis:    Component Value  Date/Time   COLORURINE YELLOW 12/11/2015 0652   APPEARANCEUR CLOUDY (A) 12/11/2015 0652   LABSPEC 1.017 12/11/2015 0652   PHURINE 6.0 12/11/2015 0652   GLUCOSEU NEGATIVE 12/11/2015 0652   HGBUR LARGE (A) 12/11/2015 0652   BILIRUBINUR NEGATIVE 12/11/2015 0652   KETONESUR 15 (A) 12/11/2015 0652   PROTEINUR NEGATIVE 12/11/2015 0652   UROBILINOGEN 1.0 11/23/2014 0249   NITRITE NEGATIVE 12/11/2015 0652   LEUKOCYTESUR LARGE (A) 12/11/2015 [id number]     ) Recent Results (from the past 240 hour(s))  MRSA PCR Screening     Status: None   Collection Time: 03/23/16  9:58 PM  Result Value Ref Range Status   MRSA by PCR NEGATIVE NEGATIVE Final    Comment:        The GeneXpert MRSA Assay (FDA approved for NASAL specimens only), is one component of a comprehensive MRSA colonization surveillance program. It is not intended to diagnose MRSA infection nor to guide or monitor treatment for MRSA infections.       Anti-infectives    None       Radiology Studies: Dg Chest Port 1 View  Result Date: 03/23/2016 CLINICAL DATA:  Shortness breath.  Mid chest discomfort. EXAM: PORTABLE CHEST 1 VIEW COMPARISON:  Two-view chest x-ray 01/20/2016 FINDINGS: The heart is mildly enlarged. Atherosclerotic calcifications are present at the arch. There is continued increase in diffuse interstitial edema and bilateral effusions. Bilateral lower lobe airspace disease is evident. IMPRESSION: 1. Increasing interstitial edema and bilateral pleural effusions compatible with worsening congestive heart failure. 2. Bibasilar airspace disease likely reflects atelectasis. 3. Aortic atherosclerosis. Electronically Signed   By: San Morelle M.D.   On: 03/23/2016 13:27        Scheduled Meds: . calcitonin (salmon)  1 spray Alternating Nares Daily  . digoxin  0.125 mg Oral Daily  . furosemide  40 mg Oral Daily  . levalbuterol  1.25 mg Nebulization TID  . metoprolol  50 mg Oral BID  . oxybutynin  10 mg Oral QHS    . pantoprazole  40 mg Oral Daily  . sertraline  25 mg Oral QHS  . sodium chloride flush  3 mL Intravenous Q12H  . tiotropium  18 mcg Inhalation Daily  . tiZANidine  2 mg Oral QHS   Continuous Infusions:    LOS: 1 day    Time spent: 25 min    [place], DO [facility] Pager [phone]  If 7PM-7AM, please contact night-coverage [email or web address] Password [email or web address] 03/24/2016, 12:04 PM

## 2016-03-25 DIAGNOSIS — I10 Essential (primary) hypertension: Secondary | ICD-10-CM

## 2016-03-25 DIAGNOSIS — I5031 Acute diastolic (congestive) heart failure: Secondary | ICD-10-CM

## 2016-03-25 DIAGNOSIS — K219 Gastro-esophageal reflux disease without esophagitis: Secondary | ICD-10-CM

## 2016-03-25 LAB — TYPE AND SCREEN
BLOOD PRODUCT EXPIRATION DATE: 201802012359
Blood Product Expiration Date: 201801302359
UNIT TYPE AND RH: 1700
Unit Type and Rh: 1700

## 2016-03-25 MED ORDER — FUROSEMIDE 10 MG/ML IJ SOLN
40.0000 mg | Freq: Once | INTRAMUSCULAR | Status: AC
  Administered 2016-03-25: 40 mg via INTRAVENOUS
  Filled 2016-03-25: qty 4

## 2016-03-25 MED ORDER — DABIGATRAN ETEXILATE MESYLATE 150 MG PO CAPS
150.0000 mg | ORAL_CAPSULE | Freq: Two times a day (BID) | ORAL | Status: DC
  Administered 2016-03-25 – 2016-03-27 (×5): 150 mg via ORAL
  Filled 2016-03-25 (×6): qty 1

## 2016-03-25 NOTE — Progress Notes (Signed)
PT Cancellation Note  Patient Details Name: LAKEDA BAJO MRN: [id number] DOB: 01-23-1941   Cancelled Treatment:    Reason Eval/Treat Not Completed: Medical issues which prohibited therapy.  Pt reports pain across her chest and trouble breathing, though her sats and HR were adequate. 03/25/2016  Donnella Sham, PT [phone]  (pager)   Tessie Fass Onesimo Lingard 03/25/2016, 1:26 PM

## 2016-03-25 NOTE — Progress Notes (Signed)
PROGRESS NOTE        PATIENT DETAILS Name: [name] [name] Age: 76 y.o. Sex: female Date of Birth: 06/22/1940 Admit Date: 03/21/2016 Admitting Physician Elwin Mocha, MD [id number], [facility], MD  Brief Narrative: Patient is a 76 y.o. 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 See below for further details  Subjective: Claims no further episodes of hematochezia since admission. Breathing slowly improving-claims that she is not yet back to her baseline. Very hard of hearing  Assessment/Plan: Lower GI bleeding: ? Diverticular etiology. Resolved. No further hematochezia since admission. Hemoglobin stable-in normal range. Spoke with Dr. Benson Norway today, recommends that we resume anticoagulation-as bleeding was minimal with hemoglobin being stable. Subsequently spoke with patient's son-[name] [name] over the phone, explained risk of restarting anticoagulation-he was agreeable to restarting anticoagulation and monitoring. Monitor overnight, if no further hematochezia while on anticoagulation-back to SNF on 0000000  Acute diastolic heart failure: Slowly improving, will give one dose of IV Lasix today. I/O's inaccurate, weight slowly decreasing.  COPD on home O2 3 L/m: Lungs clear, suspect worsening shortness of breath probably secondary to diastolic heart failure. Continue bronchodilators.  Paroxysmal atrial fibrillation: Rate controlled with digoxin and metoprolol. See above regarding restarting-regarding Pradaxa.CHADVASc 3  Hypertension: Controlled, continue metoprolol.  Dyslipidemia: Continue statin  GERD: Continue PPI  Chronic thrombocytopenia: Stable-follow periodically  DVT Prophylaxis: Starting Full dose anticoagulation with Pradaxa  Code Status:  DNR  Family Communication: Son over the phone  Disposition Plan: Remain inpatient- SNF on discharge-likely on 1/12-if no recurrent bleeding while on  anticoagulation  Antimicrobial agents: Anti-infectives    None      Procedures: None  CONSULTS:  GI  Time spent: 25- minutes-Greater than 50% of this time was spent in counseling, explanation of diagnosis, planning of further management, and coordination of care.  MEDICATIONS: Scheduled Meds: . calcitonin (salmon)  1 spray Alternating Nares Daily  . dabigatran  150 mg Oral BID  . digoxin  0.125 mg Oral Daily  . levalbuterol  1.25 mg Nebulization TID  . metoprolol  50 mg Oral BID  . oxybutynin  10 mg Oral QHS  . pantoprazole  40 mg Oral Daily  . sertraline  25 mg Oral QHS  . sodium chloride flush  3 mL Intravenous Q12H  . tiotropium  18 mcg Inhalation Daily  . tiZANidine  2 mg Oral QHS   Continuous Infusions: PRN Meds:.acetaminophen **OR** acetaminophen, fluticasone, levalbuterol, morphine injection, ondansetron **OR** ondansetron (ZOFRAN) IV   PHYSICAL EXAM: Vital signs: Vitals:   03/25/16 0524 03/25/16 0739 03/25/16 0740 03/25/16 0947  BP: 134/77   (!) 121/50  Pulse: 69   78  Resp: 18   18  Temp: 97.9 F (36.6 C)   97.6 F (36.4 C)  TempSrc:    Oral  SpO2: 99% 98% 98% 97%  Weight:       Filed Weights   03/23/16 0630 03/24/16 0659 03/24/16 2041  Weight: 60 kg (132 lb 4.4 oz) 60 kg (132 lb 4.4 oz) 59 kg (130 lb 1.1 oz)   Body mass index is 24.58 kg/m.   General appearance :Awake, alert, not in any distress. Speech Clear. Chronically sick appearing Eyes:, pupils equally reactive to light and accomodation,no scleral icterus.Pink conjunctiva HEENT: Atraumatic and Normocephalic Neck: supple, no JVD. No cervical lymphadenopathy. No thyromegaly Resp:Good air entry bilaterally, no  added sounds  CVS: S1 S2 regular, no murmurs.  GI: Bowel sounds present, Non tender and not distended with no gaurding, rigidity or rebound.No organomegaly Extremities: B/L Lower Ext shows no edema, both legs are warm to touch Neurology:  speech clear,Non focal, sensation is grossly  intact. Musculoskeletal:No digital cyanosis Skin:No Rash, warm and dry Wounds:N/A  I have personally reviewed following labs and imaging studies  LABORATORY DATA: CBC:  Recent Labs Lab 03/21/16 1218 03/21/16 1705 03/22/16 0347 03/23/16 0900 03/24/16 0545  WBC 11.2* 11.3* 8.4 13.6* 11.7*  NEUTROABS 8.8*  --   --   --   --   HGB 12.9 12.5 11.9* 12.8 12.4  HCT 44.6 42.7 41.0 44.0 43.0  MCV 91.2 91.0 92.6 92.2 92.3  PLT 145* 145* 108* 133* 128*    Basic Metabolic Panel:  Recent Labs Lab 03/21/16 1218 03/21/16 1705 03/22/16 0347 03/24/16 0545  NA 141  --  141 143  K 4.8  --  3.7 4.5  CL 98*  --  100* 102  CO2 34*  --  33* 34*  GLUCOSE 111*  --  94 92  BUN 14  --  11 11  CREATININE 0.73  --  0.56 0.62  CALCIUM 9.0  --  8.7* 8.4*  MG  --  1.8  --   --   PHOS  --  3.9  --   --     GFR: Estimated Creatinine Clearance: 50.9 mL/min (by C-G formula based on SCr of 0.62 mg/dL).  Liver Function Tests:  Recent Labs Lab 03/21/16 1218 03/22/16 0347  AST 28 22  ALT 18 17  ALKPHOS 62 55  BILITOT 1.0 0.5  PROT 6.1* 5.5*  ALBUMIN 2.9* 2.7*   No results for input(s): LIPASE, AMYLASE in the last 168 hours. No results for input(s): AMMONIA in the last 168 hours.  Coagulation Profile: No results for input(s): INR, PROTIME in the last 168 hours.  Cardiac Enzymes: No results for input(s): CKTOTAL, CKMB, CKMBINDEX, TROPONINI in the last 168 hours.  BNP (last 3 results) No results for input(s): PROBNP in the last 8760 hours.  HbA1C: No results for input(s): HGBA1C in the last 72 hours.  CBG: No results for input(s): GLUCAP in the last 168 hours.  Lipid Profile: No results for input(s): CHOL, HDL, LDLCALC, TRIG, CHOLHDL, LDLDIRECT in the last 72 hours.  Thyroid Function Tests: No results for input(s): TSH, T4TOTAL, FREET4, T3FREE, THYROIDAB in the last 72 hours.  Anemia Panel: No results for input(s): VITAMINB12, FOLATE, FERRITIN, TIBC, IRON, RETICCTPCT in the  last 72 hours.  Urine analysis:    Component Value Date/Time   COLORURINE YELLOW 12/11/2015 0652   APPEARANCEUR CLOUDY (A) 12/11/2015 0652   LABSPEC 1.017 12/11/2015 0652   PHURINE 6.0 12/11/2015 0652   GLUCOSEU NEGATIVE 12/11/2015 0652   HGBUR LARGE (A) 12/11/2015 0652   BILIRUBINUR NEGATIVE 12/11/2015 0652   KETONESUR 15 (A) 12/11/2015 0652   PROTEINUR NEGATIVE 12/11/2015 0652   UROBILINOGEN 1.0 11/23/2014 0249   NITRITE NEGATIVE 12/11/2015 0652   LEUKOCYTESUR LARGE (A) 12/11/2015 0652    Sepsis Labs: Lactic Acid, Venous    Component Value Date/Time   LATICACIDVEN 0.54 11/20/2014 2048    MICROBIOLOGY: Recent Results (from the past 240 hour(s))  MRSA PCR Screening     Status: None   Collection Time: 03/23/16  9:58 PM  Result Value Ref Range Status   MRSA by PCR NEGATIVE NEGATIVE Final    Comment:  The GeneXpert MRSA Assay (FDA approved for NASAL specimens only), is one component of a comprehensive MRSA colonization surveillance program. It is not intended to diagnose MRSA infection nor to guide or monitor treatment for MRSA infections.     RADIOLOGY STUDIES/RESULTS: Dg Chest Port 1 View  Result Date: 03/23/2016 CLINICAL DATA:  Shortness breath.  Mid chest discomfort. EXAM: PORTABLE CHEST 1 VIEW COMPARISON:  Two-view chest x-ray 01/20/2016 FINDINGS: The heart is mildly enlarged. Atherosclerotic calcifications are present at the arch. There is continued increase in diffuse interstitial edema and bilateral effusions. Bilateral lower lobe airspace disease is evident. IMPRESSION: 1. Increasing interstitial edema and bilateral pleural effusions compatible with worsening congestive heart failure. 2. Bibasilar airspace disease likely reflects atelectasis. 3. Aortic atherosclerosis. Electronically Signed   By: San Morelle M.D.   On: 03/23/2016 13:27     LOS: 2 days   Oren Binet, MD  [facility] Pager:[phone]  If 7PM-7AM, please contact  night-coverage [email or web address] Password [facility] 03/25/2016, 12:47 PM

## 2016-03-26 DIAGNOSIS — I4891 Unspecified atrial fibrillation: Secondary | ICD-10-CM

## 2016-03-26 LAB — CBC
HCT: 44.5 % (ref 36.0–46.0)
HEMOGLOBIN: 12.7 g/dL (ref 12.0–15.0)
MCH: 26.6 pg (ref 26.0–34.0)
MCHC: 28.5 g/dL — ABNORMAL LOW (ref 30.0–36.0)
MCV: 93.1 fL (ref 78.0–100.0)
PLATELETS: 113 10*3/uL — AB (ref 150–400)
RBC: 4.78 MIL/uL (ref 3.87–5.11)
RDW: 16.7 % — ABNORMAL HIGH (ref 11.5–15.5)
WBC: 14.2 10*3/uL — AB (ref 4.0–10.5)

## 2016-03-26 MED ORDER — HYDROCODONE-ACETAMINOPHEN 5-325 MG PO TABS: 1.0000 | ORAL_TABLET | ORAL | 0 refills | Status: DC | PRN

## 2016-03-26 NOTE — Evaluation (Signed)
Physical Therapy Evaluation Patient Details Name: [name] [name] MRN: [id number] DOB: Sep 24, 1940 Today's Date: 03/26/2016   History of Present Illness  Patient is a 76 yo female admitted 03/21/16 with epigastric pain and rectal bleeding.  Patient also with chest tightness and SOB.   PMH:  COPD on O2, Afib, HTN, anemia, CHF, arthritis in back, CKD, HOH  Clinical Impression  Patient presents with problems listed below.  Patient to d/c to SNF today per chart.  Recommend continued PT at SNF to address problems listed below.  Will defer PT treatment to SNF.    Follow Up Recommendations SNF;Supervision/Assistance - 24 hour    Equipment Recommendations  None recommended by PT    Recommendations for Other Services       Precautions / Restrictions Precautions Precautions: Fall Precaution Comments: Patient non-ambulatory; uses w/c at SNF Restrictions Weight Bearing Restrictions: No      Mobility  Bed Mobility Overal bed mobility: Needs Assistance Bed Mobility: Rolling;Sidelying to Sit;Sit to Supine Rolling: Min assist Sidelying to sit: Min assist   Sit to supine: Min assist   General bed mobility comments: Verbal cues for technique.  Assist to raise trunk to upright position.  Once upright, patient able to maintain sitting balance with min guard assist x 6 minutes.  Patient with SOB in sitting - dyspnea 4/4.  Patient returned to supine with min assist to bring LE's onto bed.  Transfers                 General transfer comment: NT  Ambulation/Gait                Stairs            Wheelchair Mobility    Modified Rankin (Stroke Patients Only)       Balance Overall balance assessment: Needs assistance Sitting-balance support: No upper extremity supported;Feet unsupported Sitting balance-Leahy Scale: Fair                                       Pertinent Vitals/Pain Pain Assessment: Faces Faces Pain Scale: Hurts even more Pain Location:  back Pain Descriptors / Indicators: Sore Pain Intervention(s): Limited activity within patient's tolerance;Monitored during session;Repositioned    Home Living Family/patient expects to be discharged to:: Skilled nursing facility                 Additional Comments: Is from [facility] per patient    Prior Function Level of Independence: Needs assistance   Gait / Transfers Assistance Needed: Patient non-ambulatory at SNF, using w/c during day.  Assist to transfer and attempt gait.  ADL's / Homemaking Assistance Needed: Assist for ADL's.        Hand Dominance   Dominant Hand: Left    Extremity/Trunk Assessment   Upper Extremity Assessment Upper Extremity Assessment: Generalized weakness (Decreased shoulder ROM)    Lower Extremity Assessment Lower Extremity Assessment: Generalized weakness    Cervical / Trunk Assessment Cervical / Trunk Assessment: Kyphotic  Communication   Communication: HOH  Cognition Arousal/Alertness: Awake/alert Behavior During Therapy: WFL for tasks assessed/performed;Flat affect Overall Cognitive Status: Within Functional Limits for tasks assessed                      General Comments      Exercises General Exercises - Lower Extremity Ankle Circles/Pumps: AROM;Both;10 reps;Seated Long Arc Quad: AROM;Both;5 reps;Seated Hip Flexion/Marching: AROM;Both;5  reps;Seated   Assessment/Plan    PT Assessment All further PT needs can be met in the next venue of care  PT Problem List Decreased strength;Decreased activity tolerance;Decreased balance;Decreased mobility;Decreased knowledge of use of DME;Cardiopulmonary status limiting activity;Pain          PT Treatment Interventions      PT Goals (Current goals can be found in the Care Plan section)  Acute Rehab PT Goals PT Goal Formulation: All assessment and education complete, DC therapy (Patient to continue PT at SNF - to d/c today.)    Frequency     Barriers to discharge         Co-evaluation               End of Session Equipment Utilized During Treatment: Oxygen Activity Tolerance: Patient limited by fatigue;Patient limited by pain Patient left: in bed;with call bell/phone within reach;with SCD's reapplied Nurse Communication: Mobility status (Patient requesting breathing treatment)         Time: 9828-6751 PT Time Calculation (min) (ACUTE ONLY): 13 min   Charges:   PT Evaluation $PT Eval Moderate Complexity: 1 Procedure     PT G Codes:        Despina Pole 03-29-16, 12:14 PM Carita Pian. Sanjuana Kava, [place] Pager [phone]

## 2016-03-26 NOTE — NC FL2 (Signed)
[place] MEDICAID FL2 LEVEL OF CARE SCREENING TOOL     IDENTIFICATION  Patient Name: [name] [name] Birthdate: Oct 10, 1940 Sex: female Admission Date (Current Location): 03/21/2016  [facility] and [place] Number:  Herbalist and Address:  [facility], [place] [address], [place], [place] [address]      Provider Number: [id number]  Attending Physician Name and Address:  Jonetta Osgood, MD  Relative Name and Phone Number:  Cori Razor; [phone]     Current Level of Care: Hospital Recommended Level of Care: [place] Prior Approval Number:    Date Approved/Denied:   PASRR Number: [id number]  Discharge Plan: SNF    Current Diagnoses: Patient Active Problem List   Diagnosis Date Noted  . Acute diastolic congestive heart failure ([place])   . Rectal bleeding 03/21/2016  . Malnutrition of moderate degree 01/18/2016  . Acute exacerbation of chronic obstructive pulmonary disease (COPD) ([place]) 01/17/2016  . Chronic respiratory failure with hypoxia ([place]) 01/17/2016  . Pleural effusion 01/17/2016  . Atrial fibrillation ([place]) 12/10/2015  . Chronic diastolic congestive heart failure ([place]) 12/10/2015  . Dysuria 12/10/2015  . HLD (hyperlipidemia) 12/10/2015  . Depression (emotion) 12/10/2015  . GERD (gastroesophageal reflux disease) 12/10/2015  . Pneumothorax, right 11/20/2014  . Pneumothorax   . Recurrent spontaneous pneumothorax   . Pneumothorax on right 10/09/2014  . Pneumothorax on left 09/14/2014  . Essential hypertension   . Microcytic anemia 04/27/2014  . Anemia due to acute blood loss 08/03/2013  . Paroxysmal atrial fibrillation ([place]) 02/03/2012  . Fracture of right hip ([place]) 01/29/2012  . Fall due to stumbling 01/29/2012  . CAD (coronary artery disease) 01/29/2012  . 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 Osteoarthritis 01/29/2012  . Closed fracture of right distal radius  10/13/2011    Class: Acute    Orientation RESPIRATION BLADDER Height & Weight     Self, Time, Situation, Place  Normal Incontinent Weight: 130 lb 4.7 oz (59.1 kg) Height:     BEHAVIORAL SYMPTOMS/MOOD NEUROLOGICAL BOWEL NUTRITION STATUS      Continent Diet (Soft)  AMBULATORY STATUS COMMUNICATION OF NEEDS Skin   Limited Assist Verbally Other (Comment) (Ecchymosis right/left/mid hand and arm)                       Personal Care Assistance Level of Assistance  Bathing, Feeding, Dressing Bathing Assistance: Limited assistance Feeding assistance: Independent Dressing Assistance: Limited assistance     Functional Limitations Info  Sight, Hearing, Speech Sight Info: Adequate Hearing Info: Impaired Speech Info: Adequate    SPECIAL CARE FACTORS FREQUENCY  PT (By licensed PT)     PT Frequency: Evaluation 1/12              Contractures Contractures Info: Not present    Additional Factors Info  Code Status, Allergies Code Status Info: DNR Allergies Info: Egg or egg derived products           Current Medications (03/26/2016):  This is the current hospital active medication list Current Facility-Administered Medications  Medication Dose Route Frequency Provider Last Rate Last Dose  . acetaminophen (TYLENOL) tablet 650 mg  650 mg Oral Q6H PRN Samella Parr, NP   650 mg at 03/25/16 1439   Or  . acetaminophen (TYLENOL) suppository 650 mg  650 mg Rectal Q6H PRN Samella Parr, NP      . calcitonin (salmon) (MIACALCIN/FORTICAL) nasal spray 1 spray  1  spray Alternating Nares Daily Samella Parr, NP   1 spray at 03/26/16 1021  . dabigatran (PRADAXA) capsule 150 mg  150 mg Oral BID Jonetta Osgood, MD   150 mg at 03/26/16 1021  . digoxin (LANOXIN) tablet 0.125 mg  0.125 mg Oral Daily Samella Parr, NP   0.125 mg at 03/26/16 1021  . fluticasone (FLONASE) 50 MCG/ACT nasal spray 1 spray  1 spray Each Nare Daily PRN Samella Parr, NP      . levalbuterol Penne Lash)  nebulizer solution 1.25 mg  1.25 mg Nebulization Q4H PRN Samella Parr, NP   1.25 mg at 03/25/16 1828  . levalbuterol (XOPENEX) nebulizer solution 1.25 mg  1.25 mg Nebulization TID Geradine Girt, DO   1.25 mg at 03/26/16 0753  . metoprolol (LOPRESSOR) tablet 50 mg  50 mg Oral BID Geradine Girt, DO   50 mg at 03/26/16 1021  . morphine 2 MG/ML injection 1-2 mg  1-2 mg Intravenous Q2H PRN Samella Parr, NP   2 mg at 03/23/16 1533  . ondansetron (ZOFRAN) tablet 4 mg  4 mg Oral Q6H PRN Samella Parr, NP       Or  . ondansetron [facility]) injection 4 mg  4 mg Intravenous Q6H PRN Samella Parr, NP      . oxybutynin (DITROPAN-XL) 24 hr tablet 10 mg  10 mg Oral QHS Samella Parr, NP   10 mg at 03/25/16 2116  . pantoprazole (PROTONIX) EC tablet 40 mg  40 mg Oral Daily Samella Parr, NP   40 mg at 03/26/16 1021  . sertraline (ZOLOFT) tablet 25 mg  25 mg Oral QHS Samella Parr, NP   25 mg at 03/25/16 2115  . sodium chloride flush (NS) 0.9 % injection 3 mL  3 mL Intravenous Q12H Samella Parr, NP   3 mL at 03/26/16 1022  . tiotropium (SPIRIVA) inhalation capsule 18 mcg  18 mcg Inhalation Daily Samella Parr, NP   18 mcg at 03/26/16 0753  . tiZANidine (ZANAFLEX) tablet 2 mg  2 mg Oral QHS Samella Parr, NP   2 mg at 03/25/16 2114     Discharge Medications: Please see discharge summary for a list of discharge medications.  Relevant Imaging Results:  Relevant Lab Results:   Additional Information [phone]  Sable Feil, LCSW

## 2016-03-26 NOTE — Clinical Social Work Note (Signed)
Clinical Social Work Assessment  Patient Details  Name: [name] [name] MRN: [id number] Date of Birth: 05/09/1940  Date of referral:  03/26/16               Reason for consult:  Facility Placement                Permission sought to share information with:  Family Supports Permission granted to share information::  No (Patient did not give verbal permisssion, but allowed son to speak on her behalf)  Name::     Irven Shelling  Agency::     Relationship::  Son  Contact Information:  [phone]  Housing/Transportation Living arrangements for the past 2 months:  [place] (Came to hospital from [facility]) Source of Information:  Adult Children Patient Interpreter Needed:  None Criminal Activity/Legal Involvement Pertinent to Current Situation/Hospitalization:  No - Comment as needed Significant Relationships:  Adult Children, Other Family Members Lives with:  Facility Resident (From [employer] [facility]) Do you feel safe going back to the place where you live?  Yes Need for family participation in patient care:  Yes (Comment)  Care giving concerns:  Patient from [facility] and son wants patient to return there at discharge.   Social Worker assessment / plan:  [name] talked with son during his visit with patient regarding discharge disposition and to confirm return to [facility]. Mr. Lorrene Reid reported that patient has been at facility a few months and he is pleased with the care and does want patient to return there at discharge.  Employment status:  Retired Forensic scientist:  [employer]) PT Recommendations:  [place] / Referral to community resources:  [place] (Patient from [facility])  Patient/Family's Response to care:  No concerns expressed regarding care during hospitalization.  Patient/Family's Understanding of and Emotional Response to Diagnosis, Current Treatment, and Prognosis: Not  discussed.  Emotional Assessment Appearance:  Appears stated age Attitude/Demeanor/Rapport:  Other (Quiet) Affect (typically observed):  Quiet Orientation:  Oriented to Self, Oriented to Place, Oriented to  Time, Oriented to Situation Alcohol / Substance use:  Tobacco Use, Alcohol Use, Illicit Drugs (Patient reported that she quit smoking and does not drink or use illicit drugs) Psych involvement (Current and /or in the community):  No (Comment)  Discharge Needs  Concerns to be addressed:  Discharge Planning Concerns Readmission within the last 30 days:  No Current discharge risk:  None Barriers to Discharge:  [place], [place], LCSW 03/26/2016, 5:38 PM

## 2016-03-26 NOTE — Clinical Social Work Note (Signed)
Talked with patient's son, Irven Shelling (8:27 pm) at [phone] regarding his mother's discharge on Saturday. Explained that she will have to d/c to a facility that will accept her prior to receiving insurance authorization and Mr. Lorrene Reid expressed understanding and thanked [name] for information and for  being upfront and honest with him regarding his mother's discharge. Mr. Lorrene Reid informed that a SW will be in contact with him regarding facility choices on Saturday.  Ludene Stokke Givens, MSW, LCSW Licensed Clinical Social Worker [place] [phone]

## 2016-03-26 NOTE — Care Management Important Message (Signed)
Important Message  Patient Details  Name: [name] [name] MRN: [id number] Date of Birth: Sep 17, 1940   Medicare Important Message Given:  Yes    Orbie Pyo 03/26/2016, 12:58 PM

## 2016-03-26 NOTE — Discharge Summary (Addendum)
PATIENT DETAILS Name: ADDALIE FURNACE Age: 76 y.o. Sex: female Date of Birth: 1940-10-12 MRN: [id number]. Admitting Physician: Elwin Mocha, MD [id number], [facility], MD  Admit Date: 03/21/2016 Discharge date: 03/27/2016  Recommendations for Outpatient Follow-up:  1. Follow up with PCP in 1-2 weeks 2. Please obtain BMP/CBC in one week 3. Please obtain palliative care follow-up at SNF-very poor overall prognoses. 4. Pradaxa started on 1/11-no further hematochezia observed-if hematochezia reoccurs, consider ultimately stopping anticoagulation.  Admitted From:  SNF  Disposition: SNF   Home Health: No  Equipment/Devices:  oxygen 3L  Discharge Condition: Stable  CODE STATUS: DNR  Diet recommendation:  Heart Healthy  Brief Summary: See H&P, Labs, Consult and Test reports for all details in brief, Patient is a 76 y.o. 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 See below for further details  Brief Hospital Course: Lower GI bleeding: ? Diverticular etiology. Resolved. No further hematochezia since admission. Hemoglobin stable-in normal range. Spoke with Dr. Benson Norway 1/11-recommends that we resume anticoagulation-as bleeding was minimal with hemoglobin being stable. Subsequently spoke with patient's son-[name] over the phone, explained risk of restarting anticoagulation-he was agreeable to restarting anticoagulation and monitoring. Anticoagulation was subsequently started on 1/11, no further hematochezia observed. She is being discharged back to a skilled nursing facility for further continued care. If her GI bleeding reoccurs, we could consider permanently stopping anticoagulation.   Acute diastolic heart failure:  compensated-required several doses of IV Lasix while hospitalized, on discharge resume oral Lasix.  COPD on home O2 3 L/m: Lungs clear, suspect worsening shortness of breath probably secondary to diastolic  heart failure. Continue bronchodilators discharge.  Paroxysmal atrial fibrillation: Rate controlled with digoxin and metoprolol. See above regarding restarting-regarding Pradaxa.CHADVASc 3  Hypertension: Controlled, continue metoprolol.  Dyslipidemia: Continue statin  GERD: Continue PPI  Chronic thrombocytopenia: Stable-follow periodically  Goals of care/palliative care: DO NOT RESUSCITATE in place. Patient appears to have severe-probably end-stage COPD, and will likely benefit from initiation of palliative measures while at SNF. Son Brown Human I  discussed with on the phone on 1/11-is aware of poor overall prognoses.   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   Paroxysmal atrial fibrillation (HCC)   Microcytic anemia   Essential hypertension   Atrial fibrillation (HCC)   Chronic diastolic congestive heart failure (HCC)   HLD (hyperlipidemia)   GERD (gastroesophageal reflux disease)   Chronic respiratory failure with hypoxia (HCC)   Acute diastolic congestive heart failure [facility])   Discharge Instructions:  Activity:  As tolerated with Full fall precautions use walker/cane & assistance as needed   Discharge Instructions    Diet - low sodium heart healthy    Complete by:  As directed    Increase activity slowly    Complete by:  As directed      Allergies as of 03/27/2016      Reactions   Eggs Or Egg-derived Products Other (See Comments)   Listed on MAR      Medication List    STOP taking these medications   alendronate 70 MG tablet Commonly known as:  FOSAMAX   diltiazem 240 MG 24 hr tablet Commonly known as:  CARDIZEM LA     TAKE these medications   atorvastatin 40 MG tablet Commonly known as:  LIPITOR Take 40 mg by mouth daily at 6 PM.   AZO CRANBERRY PO Take  2 capsules by mouth every evening.   calcitonin  (salmon) 200 UNIT/ACT nasal spray Commonly known as:  MIACALCIN/FORTICAL Place 1 spray into alternate nostrils daily.   cyanocobalamin 500 MCG tablet Take 500 mcg by mouth daily.   dabigatran 150 MG Caps capsule Commonly known as:  PRADAXA Take 150 mg by mouth 2 (two) times daily.   digoxin 0.125 MG tablet Commonly known as:  LANOXIN Take 1 tablet (0.125 mg total) by mouth daily.   docusate sodium 100 MG capsule Commonly known as:  COLACE Take 1 capsule (100 mg total) by mouth every 12 (twelve) hours.   feeding supplement (PRO-STAT SUGAR FREE 64) Liqd Take 30 mLs by mouth 2 (two) times daily.   ferrous sulfate 325 (65 FE) MG tablet Take 325 mg by mouth daily with breakfast.   Fish Oil 1000 MG Caps Take 1,000 mg by mouth at bedtime.   fluticasone 50 MCG/ACT nasal spray Commonly known as:  FLONASE Place 1 spray into both nostrils daily as needed for allergies or rhinitis.   furosemide 40 MG tablet Commonly known as:  LASIX Take 1 tablet (40 mg total) by mouth daily. What changed:  how much to take   guaiFENesin 600 MG 12 hr tablet Commonly known as:  MUCINEX Take 2 tablets (1,200 mg total) by mouth 2 (two) times daily.   HYDROcodone-acetaminophen 5-325 MG tablet Commonly known as:  NORCO/VICODIN Take 1 tablet by mouth every 4 (four) hours as needed for moderate pain.   levalbuterol 1.25 MG/3ML nebulizer solution Commonly known as:  XOPENEX Take 1.25 mg by nebulization every 4 (four) hours as needed for wheezing.   metoprolol 50 MG tablet Commonly known as:  LOPRESSOR Take 1 tablet (50 mg total) by mouth 2 (two) times daily.   multivitamin with minerals tablet Take 1 tablet by mouth daily.   OSCAL 500/200 D-3 500-200 MG-UNIT tablet Generic drug:  calcium-vitamin D Take 1 tablet by mouth 3 (three) times daily with meals.   OVER THE COUNTER MEDICATION Take 120 mLs by mouth 2 (two) times daily. Med pass   oxybutynin 10 MG 24 hr tablet Commonly known as:   DITROPAN-XL Take 10 mg by mouth at bedtime.   OXYGEN Inhale 3 L into the lungs daily as needed (for SOB).   pantoprazole 40 MG tablet Commonly known as:  PROTONIX Take 1 tablet (40 mg total) by mouth daily.   potassium chloride SA 20 MEQ tablet Commonly known as:  K-DUR,KLOR-CON Take 20 mEq by mouth daily.   sertraline 25 MG tablet Commonly known as:  ZOLOFT Take 1 tablet (25 mg total) by mouth at bedtime.   tiotropium 18 MCG inhalation capsule Commonly known as:  SPIRIVA Place 1 capsule (18 mcg total) into inhaler and inhale daily.   tiZANidine 2 MG tablet Commonly known as:  ZANAFLEX Take 2 mg by mouth at bedtime.       Contact information for follow-up providers    PANDEY, MAHIMA, MD. Schedule an appointment as soon as possible for a visit in 1 week(s).   Specialty:  Internal Medicine Contact information: [place] [address] [phone]            Contact information for after-discharge care    Destination    [facility] .   Specialty:  [place] information: [address] [place] [phone]                 Allergies  Allergen Reactions  . Eggs Or Egg-Derived Products Other (See Comments)    Listed on MAR    Consultations:   GI   Other Procedures/Studies: Dg Chest Port 1 View  Result Date: 03/23/2016 CLINICAL DATA:  Shortness breath.  Mid chest discomfort. EXAM: PORTABLE CHEST 1 VIEW COMPARISON:  Two-view chest x-ray 01/20/2016 FINDINGS: The heart is mildly enlarged. Atherosclerotic calcifications are present at the arch. There is continued increase in diffuse interstitial edema and bilateral effusions. Bilateral lower lobe airspace disease is evident. IMPRESSION: 1. Increasing interstitial edema and bilateral pleural effusions compatible with worsening congestive heart failure. 2. Bibasilar airspace disease likely reflects atelectasis. 3. Aortic  atherosclerosis. Electronically Signed   By: San Morelle M.D.   On: 03/23/2016 13:27     TODAY-DAY OF DISCHARGE:  Subjective:   Cantrice Stults today has no headache,no chest abdominal pain,no new weakness tingling or numbness, feels much better   Objective:   Blood pressure 122/71, pulse 92, temperature 98.1 F (36.7 C), temperature source Oral, resp. rate 18, weight 60.5 kg (133 lb 6.1 oz), SpO2 96 %.  Intake/Output Summary (Last 24 hours) at 03/27/16 1110 Last data filed at 03/27/16 0900  Gross per 24 hour  Intake              480 ml  Output                0 ml  Net              480 ml   Filed Weights   03/24/16 2041 03/25/16 2046 03/26/16 2042  Weight: 59 kg (130 lb 1.1 oz) 59.1 kg (130 lb 4.7 oz) 60.5 kg (133 lb 6.1 oz)    Exam: Awake Alert, Oriented *3, No new F.N deficits, Normal affect Arthur.AT,PERRAL Supple Neck,No JVD, No cervical lymphadenopathy appriciated.  Symmetrical Chest wall movement, Good air movement bilaterally, CTAB RRR,No Gallops,Rubs or new Murmurs, No Parasternal Heave +ve B.Sounds, Abd Soft, Non tender, No organomegaly appriciated, No rebound -guarding or rigidity. No Cyanosis, Clubbing or edema, No new Rash or bruise   PERTINENT RADIOLOGIC STUDIES: Dg Chest Port 1 View  Result Date: 03/23/2016 CLINICAL DATA:  Shortness breath.  Mid chest discomfort. EXAM: PORTABLE CHEST 1 VIEW COMPARISON:  Two-view chest x-ray 01/20/2016 FINDINGS: The heart is mildly enlarged. Atherosclerotic calcifications are present at the arch. There is continued increase in diffuse interstitial edema and bilateral effusions. Bilateral lower lobe airspace disease is evident. IMPRESSION: 1. Increasing interstitial edema and bilateral pleural effusions compatible with worsening congestive heart failure. 2. Bibasilar airspace disease likely reflects atelectasis. 3. Aortic atherosclerosis. Electronically Signed   By: San Morelle M.D.   On: 03/23/2016 13:27      PERTINENT LAB RESULTS: CBC:  Recent Labs  03/26/16 0630  WBC 14.2*  HGB 12.7  HCT 44.5  PLT 113*   CMET CMP     Component Value Date/Time   NA 143 03/24/2016 0545   NA 136 (A) 03/09/2016   K 4.5 03/24/2016 0545   CL 102 03/24/2016 0545   CO2 34 (H) 03/24/2016 0545   GLUCOSE 92 03/24/2016 0545   BUN 11 03/24/2016 0545   BUN 14 03/09/2016   CREATININE 0.62 03/24/2016 0545   CALCIUM 8.4 (L) 03/24/2016 0545   PROT 5.5 (L) 03/22/2016 0347   ALBUMIN 2.7 (L) 03/22/2016 0347   AST 22 03/22/2016 0347   ALT 17 03/22/2016 0347   ALKPHOS 55 03/22/2016 0347   BILITOT 0.5 03/22/2016 0347   GFRNONAA >  60 03/24/2016 0545   GFRAA >60 03/24/2016 0545    GFR Estimated Creatinine Clearance: 51.5 mL/min (by C-G formula based on SCr of 0.62 mg/dL). No results for input(s): LIPASE, AMYLASE in the last 72 hours. No results for input(s): CKTOTAL, CKMB, CKMBINDEX, TROPONINI in the last 72 hours. Invalid input(s): POCBNP No results for input(s): DDIMER in the last 72 hours. No results for input(s): HGBA1C in the last 72 hours. No results for input(s): CHOL, HDL, LDLCALC, TRIG, CHOLHDL, LDLDIRECT in the last 72 hours. No results for input(s): TSH, T4TOTAL, T3FREE, THYROIDAB in the last 72 hours.  Invalid input(s): FREET3 No results for input(s): VITAMINB12, FOLATE, FERRITIN, TIBC, IRON, RETICCTPCT in the last 72 hours. Coags: No results for input(s): INR in the last 72 hours.  Invalid input(s): PT Microbiology: Recent Results (from the past 240 hour(s))  MRSA PCR Screening     Status: None   Collection Time: 03/23/16  9:58 PM  Result Value Ref Range Status   MRSA by PCR NEGATIVE NEGATIVE Final    Comment:        The GeneXpert MRSA Assay (FDA approved for NASAL specimens only), is one component of a comprehensive MRSA colonization surveillance program. It is not intended to diagnose MRSA infection nor to guide or monitor treatment for MRSA infections.     FURTHER  DISCHARGE INSTRUCTIONS:  Get Medicines reviewed and adjusted: Please take all your medications with you for your next visit with your Primary MD  Laboratory/radiological data: Please request your Primary MD to go over all hospital tests and procedure/radiological results at the follow up, please ask your Primary MD to get all Hospital records sent to his/her office.  In some cases, they will be blood work, cultures and biopsy results pending at the time of your discharge. Please request that your primary care M.D. goes through all the records of your hospital data and follows up on these results.  Also Note the following: If you experience worsening of your admission symptoms, develop shortness of breath, life threatening emergency, suicidal or homicidal thoughts you must seek medical attention immediately by calling 911 or calling your MD immediately  if symptoms less severe.  You must read complete instructions/literature along with all the possible adverse reactions/side effects for all the Medicines you take and that have been prescribed to you. Take any new Medicines after you have completely understood and accpet all the possible adverse reactions/side effects.   Do not drive when taking Pain medications or sleeping medications (Benzodaizepines)  Do not take more than prescribed Pain, Sleep and Anxiety Medications. It is not advisable to combine anxiety,sleep and pain medications without talking with your primary care practitioner  Special Instructions: If you have smoked or chewed Tobacco  in the last 2 yrs please stop smoking, stop any regular Alcohol  and or any Recreational drug use.  Wear Seat belts while driving.  Please note: You were cared for by a hospitalist during your hospital stay. Once you are discharged, your primary care physician will handle any further medical issues. Please note that NO REFILLS for any discharge medications will be authorized once you are discharged,  as it is imperative that you return to your primary care physician (or establish a relationship with a primary care physician if you do not have one) for your post hospital discharge needs so that they can reassess your need for medications and monitor your lab values.  Total Time spent coordinating discharge including counseling, education and face to face  time equals 45 minutes.  SignedOren Binet 03/27/2016 11:10 AM

## 2016-03-26 NOTE — Clinical Social Work Note (Signed)
Consulted with Assistance [name] Director Nathaniel Man regarding patient and issues with discharge. Patient is medically stable for discharge and son wants return to [facility], however have not received insurance auth. Patient will need to discharge on Saturday to a facility with an LOG. Attempted tor reach son and messages left for him to call [name]. Handoff will be done for weekend [name].  Dede Dobesh Givens, MSW, LCSW Licensed Clinical Social Worker [place] [phone]

## 2016-03-26 NOTE — Discharge Instructions (Signed)

## 2016-03-27 NOTE — Progress Notes (Signed)
Patient discharged to facility per MD. Report called to [place] at [facility]. SW notified [facility] for transport. Bartholomew Crews, RN

## 2016-03-27 NOTE — Clinical Social Work Placement (Signed)
   CLINICAL SOCIAL WORK PLACEMENT  NOTE  Date:  03/27/2016  Patient Details  Name: [name] [name] MRN: [id number] Date of Birth: 03/18/1940  Clinical Social Work is seeking post-discharge placement for this patient at the [place] level of care (*[name] will initial, date and re-position this form in  chart as items are completed):  Yes   Patient/family provided with [facility] Work Department's list of facilities offering this level of care within the geographic area requested by the patient (or if unable, by the patient's family).  Yes   Patient/family informed of their freedom to choose among providers that offer the needed level of care, that participate in Medicare, Medicaid or managed care program needed by the patient, have an available bed and are willing to accept the patient.  Yes   Patient/family informed of [facility]'s ownership interest in [facility] and [facility], as well as of the fact that they are under no obligation to receive care at these facilities.  PASRR submitted to EDS on 03/26/16     PASRR number received on 03/26/16     Existing PASRR number confirmed on       FL2 transmitted to all facilities in geographic area requested by pt/family on       FL2 transmitted to all facilities within larger geographic area on       Patient informed that his/her managed care company has contracts with or will negotiate with certain facilities, including the following:        Yes   Patient/family informed of bed offers received.  Patient chooses bed at       Physician recommends and patient chooses bed at      Patient to be transferred to  [facility]) on 03/27/16.  Patient to be transferred to facility by  [facility])     Patient family notified on 03/27/16 of transfer.  Name of family member notified:  Son @ bedside     PHYSICIAN       Additional Comment:    _______________________________________________ Serafina Mitchell, [place] 03/27/2016, 4:37 PM

## 2016-03-27 NOTE — Clinical Social Work Note (Signed)
Clinical Social Worker notified patient is ready to DC, [name] facilitated patient discharge including contacting patient family and facility to confirm patient discharge plans and bed availability. [name] spoke to [facility] @ facility, will make exception and accept pt without auth, [name] faxed a copy of auth request from [place] so that facility knew it was in process. Clinical information faxed to facility and pt/family agreeable with plan.[name] arranged ambulance transport via [facility]. RN to call report prior to discharge.  Clinical Social Worker will sign off for now as social work intervention is no longer needed. Please consult [place] again if new need arises.  Alanee Ting B. Joline Maxcy Clinical Social Work Dept Weekend Social Worker [phone] 4:30 PM

## 2016-03-27 NOTE — Progress Notes (Signed)
Seen and examined No major issues-her breathing is stable-she has extensive history of COPD. Continues to have no hematochezia She was discharged to SNF on 1/12-due to insurance issues she is apparently scheduled to go on 1/13 See discharge summary done on 1/12 further details.

## 2016-03-28 DIAGNOSIS — R278 Other lack of coordination: Secondary | ICD-10-CM | POA: Diagnosis not present

## 2016-03-28 DIAGNOSIS — M6289 Other specified disorders of muscle: Secondary | ICD-10-CM | POA: Diagnosis not present

## 2016-03-28 DIAGNOSIS — R269 Unspecified abnormalities of gait and mobility: Secondary | ICD-10-CM | POA: Diagnosis not present

## 2016-03-29 ENCOUNTER — Telehealth: Payer: Self-pay

## 2016-03-29 ENCOUNTER — Encounter: Payer: Self-pay | Admitting: Internal Medicine

## 2016-03-29 ENCOUNTER — Non-Acute Institutional Stay (SKILLED_NURSING_FACILITY): Payer: Commercial Managed Care - HMO | Admitting: Internal Medicine

## 2016-03-29 DIAGNOSIS — F329 Major depressive disorder, single episode, unspecified: Secondary | ICD-10-CM

## 2016-03-29 DIAGNOSIS — D72829 Elevated white blood cell count, unspecified: Secondary | ICD-10-CM | POA: Diagnosis not present

## 2016-03-29 DIAGNOSIS — N3281 Overactive bladder: Secondary | ICD-10-CM

## 2016-03-29 DIAGNOSIS — R278 Other lack of coordination: Secondary | ICD-10-CM | POA: Diagnosis not present

## 2016-03-29 DIAGNOSIS — I4891 Unspecified atrial fibrillation: Secondary | ICD-10-CM

## 2016-03-29 DIAGNOSIS — F32A Depression, unspecified: Secondary | ICD-10-CM

## 2016-03-29 DIAGNOSIS — K625 Hemorrhage of anus and rectum: Secondary | ICD-10-CM

## 2016-03-29 DIAGNOSIS — D696 Thrombocytopenia, unspecified: Secondary | ICD-10-CM | POA: Diagnosis not present

## 2016-03-29 DIAGNOSIS — J449 Chronic obstructive pulmonary disease, unspecified: Secondary | ICD-10-CM | POA: Diagnosis not present

## 2016-03-29 DIAGNOSIS — D638 Anemia in other chronic diseases classified elsewhere: Secondary | ICD-10-CM | POA: Diagnosis not present

## 2016-03-29 DIAGNOSIS — G8929 Other chronic pain: Secondary | ICD-10-CM

## 2016-03-29 DIAGNOSIS — I5032 Chronic diastolic (congestive) heart failure: Secondary | ICD-10-CM

## 2016-03-29 DIAGNOSIS — M545 Low back pain: Secondary | ICD-10-CM

## 2016-03-29 DIAGNOSIS — R531 Weakness: Secondary | ICD-10-CM

## 2016-03-29 DIAGNOSIS — M6289 Other specified disorders of muscle: Secondary | ICD-10-CM | POA: Diagnosis not present

## 2016-03-29 DIAGNOSIS — J9611 Chronic respiratory failure with hypoxia: Secondary | ICD-10-CM | POA: Diagnosis not present

## 2016-03-29 DIAGNOSIS — K219 Gastro-esophageal reflux disease without esophagitis: Secondary | ICD-10-CM | POA: Diagnosis not present

## 2016-03-29 DIAGNOSIS — R269 Unspecified abnormalities of gait and mobility: Secondary | ICD-10-CM | POA: Diagnosis not present

## 2016-03-29 DIAGNOSIS — R05 Cough: Secondary | ICD-10-CM | POA: Diagnosis not present

## 2016-03-29 NOTE — Progress Notes (Signed)
LOCATION: [name]  PCP: Blanchie Serve, MD   Code Status: DNR  Goals of care: Advanced Directive information Advanced Directives 03/27/2016  Does Patient Have a Medical Advance Directive? Yes  Type of Advance Directive Out of facility DNR (pink MOST or yellow form)  Does patient want to make changes to medical advance directive? No - Patient declined  Copy of [place] in Chart? -  Would patient like information on creating a medical advance directive? -  Pre-existing out of facility DNR order (yellow form or pink MOST form) -       Extended Emergency Contact Information Primary Emergency Contact: [name] Address: [address]          [place], [place] [address] [name] Phone: [phone] Relation: Son Secondary Emergency Contact: [name] [place] Phone: [phone] Relation: Relative   Allergies  Allergen Reactions  . Eggs Or Egg-Derived Products Other (See Comments)    Listed on [facility]    Chief Complaint  Patient presents with  . Readmit To SNF    Readmission Visit      HPI:  Patient is a 75 y.o. female seen today for Long-term care post hospital re-admission from 03/21/2016-03/27/2016 with hematochezia. This was thought to be from diverticular bleed. It self resolved. Her hemoglobin was stable and she did not require any transfusion. GI was consulted and recommended resuming anticoagulation given the bleed was minimal. 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 She is seen in her room today.  Review of Systems:  Constitutional: Negative for fever, chills, diaphoresis. Energy level is still improving. HENT: Negative for headache, congestion, nasal discharge, dysphagia  Eyes: Negative for double vision and  discharge.  Respiratory: Negative for wheezing. Positive for cough with yellow phlegm and shortness of breath with minimal exertion.    Cardiovascular: Negative for chest pain, palpitations, leg swelling.  Gastrointestinal: Negative for heartburn, nausea, vomiting, abdominal pain. Last bowel movement was yesterday.   Genitourinary: Negative for dysuria and flank pain.  Musculoskeletal: Negative for fall in the facility. Positive for chronic back pain.   Skin: Negative for itching, rash.  Neurological: Positive for dizziness with positional changes.  Psychiatric/Behavioral: Negative for depression.  Past Medical History:  Diagnosis Date  . Anemia   . Arthritis    "back" (11/21/2014)  . Asthma   . Blood dyscrasia    hx blood clotts  . Chronic bronchitis ([place])   . Chronic kidney disease   . Chronic lower back pain   . 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 GERD (gastroesophageal reflux disease)   . [id number])    "a few times/year" (11/21/2014)  . History of blood transfusion X 2   "related to blood thinner I was taking"  . Hyperlipidemia   . Hypertension   . Migraine    "weekly probably" (11/21/2014)  . On home oxygen therapy    "3L; 24/7" (11/21/2014)  . Pneumonia    hx pneumonia   . Pneumothorax on right 09/2014  . Pulmonary embolism ([place])   . Rectal bleed 03/2016  . Shortness of breath   . Urinary incontinence    Past Surgical History:  Procedure Laterality Date  . ABDOMINAL HYSTERECTOMY    . CATARACT EXTRACTION W/ INTRAOCULAR LENS  IMPLANT, BILATERAL Bilateral   .  CYSTOSCOPY W/ RETROGRADES Bilateral 06/11/2014   Procedure: CYSTOSCOPY WITH RETROGRADE PYELOGRAM;  Surgeon: [name], MD;  Location: [facility];  Service: Urology;  Laterality: Bilateral;  . FRACTURE SURGERY    . HARDWARE REMOVAL  01/29/2012   Procedure: HARDWARE REMOVAL;  Surgeon: [name], MD;  Location: [place];  Service: Orthopedics;  Laterality: Right;  .  HEMIARTHROPLASTY SHOULDER FRACTURE Right   . HIP ARTHROPLASTY  01/29/2012   Procedure: ARTHROPLASTY BIPOLAR HIP;  Surgeon: [name], MD;  Location: [place];  Service: Orthopedics;  Laterality: Right;  . LAPAROSCOPIC CHOLECYSTECTOMY    . ORIF WRIST FRACTURE  10/13/2011   Procedure: OPEN REDUCTION INTERNAL FIXATION (ORIF) WRIST FRACTURE;  Surgeon: [name], MD;  Location: [place];  Service: Orthopedics;  Laterality: Right;  Open Reduction Internal Fixation Right Distal Radius   . TRANSURETHRAL RESECTION OF BLADDER TUMOR N/A 06/11/2014   Procedure: TRANSURETHRAL RESECTION OF BLADDER TUMOR (TURBT);  Surgeon: [name], MD;  Location: [facility];  Service: Urology;  Laterality: N/A;  . TUBAL LIGATION     Social History:   reports that she quit smoking about 2 years ago. Her smoking use included Cigarettes. She has a 56.00 pack-year smoking history. She has never used smokeless tobacco. She reports that she does not drink alcohol or use drugs.  Family History  Problem Relation Age of Onset  . Cancer - Lung Mother   . Coronary artery disease Mother   . Coronary artery disease Sister   . Coronary artery disease Brother     Medications: Allergies as of 03/29/2016      Reactions   Eggs Or Egg-derived Products Other (See Comments)   Listed on [facility]      Medication List       Accurate as of 03/29/16  3:05 PM. Always use your most recent med list.          atorvastatin 40 MG tablet Commonly known as:  LIPITOR Take 40 mg by mouth daily at 6 PM.   AZO CRANBERRY PO Take 2 capsules by mouth every evening.   calcitonin (salmon) 200 UNIT/ACT nasal spray Commonly known as:  MIACALCIN/FORTICAL Place 1 spray into alternate nostrils daily.   cyanocobalamin 500 MCG tablet Take 500 mcg by mouth daily.   digoxin 0.125 MG tablet Commonly known as:  LANOXIN Take 1 tablet (0.125 mg total) by mouth daily.   docusate sodium 100 MG capsule Commonly known as:  COLACE Take 1 capsule (100 mg  total) by mouth every 12 (twelve) hours.   feeding supplement (PRO-STAT SUGAR FREE 64) Liqd Take 30 mLs by mouth 2 (two) times daily.   ferrous sulfate 325 (65 FE) MG tablet Take 325 mg by mouth daily with breakfast.   fluticasone 50 MCG/ACT nasal spray Commonly known as:  FLONASE Place 1 spray into both nostrils daily as needed for allergies or rhinitis.   furosemide 40 MG tablet Commonly known as:  LASIX Take 1 tablet (40 mg total) by mouth daily.   guaiFENesin 600 MG 12 hr tablet Commonly known as:  MUCINEX Take 2 tablets (1,200 mg total) by mouth 2 (two) times daily.   HYDROcodone-acetaminophen 5-325 MG tablet Commonly known as:  NORCO/VICODIN Take 1 tablet by mouth every 4 (four) hours as needed for moderate pain.   levalbuterol 1.25 MG/3ML nebulizer solution Commonly known as:  XOPENEX Take 1.25 mg by nebulization every 4 (four) hours as needed for wheezing.   metoprolol 50 MG tablet Commonly known as:  LOPRESSOR Take 1 tablet (50 mg total) by mouth 2 (two) times daily.   multivitamin with minerals tablet Take 1 tablet by mouth daily.   OSCAL 500/200 D-3 500-200 MG-UNIT tablet Generic drug:  calcium-vitamin D Take 1 tablet by mouth 3 (three) times daily with meals.   OVER THE COUNTER MEDICATION Take 120 mLs by mouth 2 (two) times daily. Med pass   oxybutynin 10 MG 24 hr tablet Commonly known as:  DITROPAN-XL Take 10 mg by mouth at bedtime.   OXYGEN Inhale 3 L into the lungs daily as needed (for SOB).   pantoprazole 40 MG tablet Commonly known as:  PROTONIX Take 1 tablet (40 mg total) by mouth daily.   potassium chloride SA 20 MEQ tablet Commonly known as:  K-DUR,KLOR-CON Take 20 mEq by mouth daily.   sertraline 25 MG tablet Commonly known as:  ZOLOFT Take 1 tablet (25 mg total) by mouth at bedtime.   tiotropium 18 MCG inhalation capsule Commonly known as:  SPIRIVA Place 1 capsule (18 mcg total) into inhaler and inhale daily.   tiZANidine 2 MG  tablet Commonly known as:  ZANAFLEX Take 2 mg by mouth at bedtime.       Immunizations: Immunization History  Administered Date(s) Administered  . Influenza-Unspecified 11/13/2013, 12/15/2015  . PPD Test 12/16/2015     Physical Exam: Vitals:   03/29/16 1454  BP: 132/80  Pulse: 77  Resp: 20  Temp: 98.3 F (36.8 C)  TempSrc: Oral  SpO2: 93%  Weight: 133 lb 4.8 oz (60.5 kg)  Height: 5\' 1"  (1.549 m)   Body mass index is 25.19 kg/m.  General- elderly female, Frail and chronically ill-appearing, in no acute distress Head- normocephalic, atraumatic Nose- no maxillary or frontal sinus tenderness, no nasal discharge Throat- moist mucus membrane, no oral thrush, edentulous Eyes- PERRLA, EOMI, no pallor, no icterus, no discharge, normal conjunctiva, normal sclera Neck- no cervical lymphadenopathy Cardiovascular- normal s1,s2, no murmur Respiratory- bilateral poor air movement, no wheeze, no rhonchi, no crackles, no use of accessory muscles on 3 L oxygen by nasal cannula Abdomen- bowel sounds present, soft, non tender Musculoskeletal- able to move all 4 extremities, generalized weakness Neurological- alert and oriented to person, place and time Skin- warm and dry, easy bruising Psychiatry- normal mood and affect    Labs reviewed: Basic Metabolic Panel:  Recent Labs  01/17/16 0922  03/21/16 1218 03/21/16 1705 03/22/16 0347 03/24/16 0545  NA  --   < > 141  --  141 143  K  --   < > 4.8  --  3.7 4.5  CL  --   < > 98*  --  100* 102  CO2  --   < > 34*  --  33* 34*  GLUCOSE  --   < > 111*  --  94 92  BUN  --   < > 14  --  11 11  CREATININE  --   < > 0.73  --  0.56 0.62  CALCIUM  --   < > 9.0  --  8.7* 8.4*  MG 1.8  --   --  1.8  --   --   PHOS  --   --   --  3.9  --   --   < > = values in this interval not displayed. Liver Function Tests:  Recent Labs  01/22/16 0329 03/21/16 1218 03/22/16 0347  AST 20 28 22   ALT 27 18 17   ALKPHOS 49 62 55  BILITOT 0.6 1.0 0.5   PROT 4.9* 6.1* 5.5*  ALBUMIN 2.5* 2.9* 2.7*    Recent Labs  08/30/15 0825  LIPASE 23   No results for input(s): AMMONIA in the last 8760 hours. CBC:  Recent Labs  12/10/15 1526  01/17/16 0555  03/21/16 1218  03/23/16 0900 03/24/16 0545 03/26/16 0630  WBC 9.6  < > 8.7  < > 11.2*  < > 13.6* 11.7* 14.2*  NEUTROABS 7.9*  --  6.9  --  8.8*  --   --   --   --   HGB 11.8*  < > 13.8  < > 12.9  < > 12.8 12.4 12.7  HCT 40.8  < > 47.9*  < > 44.6  < > 44.0 43.0 44.5  MCV 90.9  < > 93.7  < > 91.2  < > 92.2 92.3 93.1  PLT 142*  < > 140*  < > 145*  < > 133* 128* 113*  < > = values in this interval not displayed. Cardiac Enzymes:  Recent Labs  09/23/15 1300 12/10/15 1526  TROPONINI <0.03 <0.03   BNP: Invalid input(s): POCBNP CBG: No results for input(s): GLUCAP in the last 8760 hours.  Radiological Exams: Dg Chest Port 1 View  Result Date: 03/23/2016 CLINICAL DATA:  Shortness breath.  Mid chest discomfort. EXAM: PORTABLE CHEST 1 VIEW COMPARISON:  Two-view chest x-ray 01/20/2016 FINDINGS: The heart is mildly enlarged. Atherosclerotic calcifications are present at the arch. There is continued increase in diffuse interstitial edema and bilateral effusions. Bilateral lower lobe airspace disease is evident. IMPRESSION: 1. Increasing interstitial edema and bilateral pleural effusions compatible with worsening congestive heart failure. 2. Bibasilar airspace disease likely reflects atelectasis. 3. Aortic atherosclerosis. Electronically Signed   By: San Morelle M.D.   On: 03/23/2016 13:27    Assessment/Plan  Generalized weakness With Physical deconditioning. Given her medical comorbidities and progression of her LS, has overall poor prognosis. Will get palliative care consult. Will have her also to work with physical therapy and occupational therapy as tolerated to help regain her strength and ability to perform her activities of daily living.  Chronic respiratory  failure Continue oxygen by nasal cannula and bronchodilator treatment. Pulmonary toileting to be performed to help prevent atelectasis  COPD Advanced. On chronic oxygen by nasal cannula. Breathing stable at rest but has dyspnea on minimal exertion. Overall poor prognosis. Will have palliative care consult to review goals of care. Continue Spiriva and when necessary levalbuterol.  Leukocytosis Afebrile. Monitor WBC and temperature curve  Thrombocytopenia She is on anticoagulation. Has easy bruising. No bleed reported. Monitor platelet count.  Atrial fibrillation Rate controlled. Continue Lopressor 50 mg twice daily and Pradaxa 150 mg twice daily. Monitor for bleed. Continue digoxin.  Chronic diastolic congestive heart failure Continue Lopressor 50 mg twice a day and Lasix 40 mg daily with KCl supplement. Check BMP.   Anemia of chronic disease Stable hemoglobin. Continue ferrous sulfate.  Overactive bladder Continue Ditropan and monitor  Rectal bleed Resolved. Thought to be from diverticular bleed. Avoid constipation. Continue Colace 100 mg twice a day  Gastroesophageal reflux disease Continue her Protonix.  Chronic depression Continue Zoloft 25 mg at bedtime  Chronic back pain Continue hydrocodone-acetaminophen 5-325 milligrams every 4 hours as needed for pain and Zanaflex 2 mg at bedtime for muscle spasm. PMR to evaluate.    Goals of care: long term care   Labs/tests ordered: cbc, bmp 04/02/16  Family/ staff Communication: reviewed care plan with patient and nursing  supervisor    Blanchie Serve, MD Internal Medicine [facility] [address] [place],  [address] Cell Phone (Monday-Friday 8 am - 5 pm): [phone] On Call: [phone] and follow prompts after 5 pm and on weekends Office Phone: [phone] Office Fax: [phone]

## 2016-03-29 NOTE — Telephone Encounter (Signed)
Possible re-admission to facility. This is a patient you were seeing at [facility] . [facility] F/U is needed if patient was re-admitted to facility upon discharge. Hospital discharge from [facility] on 03/27/16.

## 2016-03-30 DIAGNOSIS — M6289 Other specified disorders of muscle: Secondary | ICD-10-CM | POA: Diagnosis not present

## 2016-03-30 DIAGNOSIS — R000 Tachycardia, unspecified: Secondary | ICD-10-CM | POA: Diagnosis not present

## 2016-03-30 DIAGNOSIS — R269 Unspecified abnormalities of gait and mobility: Secondary | ICD-10-CM | POA: Diagnosis not present

## 2016-03-30 DIAGNOSIS — R278 Other lack of coordination: Secondary | ICD-10-CM | POA: Diagnosis not present

## 2016-03-31 DIAGNOSIS — M6289 Other specified disorders of muscle: Secondary | ICD-10-CM | POA: Diagnosis not present

## 2016-03-31 DIAGNOSIS — R269 Unspecified abnormalities of gait and mobility: Secondary | ICD-10-CM | POA: Diagnosis not present

## 2016-03-31 DIAGNOSIS — R278 Other lack of coordination: Secondary | ICD-10-CM | POA: Diagnosis not present

## 2016-04-01 DIAGNOSIS — M6289 Other specified disorders of muscle: Secondary | ICD-10-CM | POA: Diagnosis not present

## 2016-04-01 DIAGNOSIS — M545 Low back pain: Secondary | ICD-10-CM | POA: Diagnosis not present

## 2016-04-01 DIAGNOSIS — R278 Other lack of coordination: Secondary | ICD-10-CM | POA: Diagnosis not present

## 2016-04-01 DIAGNOSIS — R269 Unspecified abnormalities of gait and mobility: Secondary | ICD-10-CM | POA: Diagnosis not present

## 2016-04-02 DIAGNOSIS — I130 Hypertensive heart and chronic kidney disease with heart failure and stage 1 through stage 4 chronic kidney disease, or unspecified chronic kidney disease: Secondary | ICD-10-CM | POA: Diagnosis not present

## 2016-04-02 DIAGNOSIS — R269 Unspecified abnormalities of gait and mobility: Secondary | ICD-10-CM | POA: Diagnosis not present

## 2016-04-02 DIAGNOSIS — R278 Other lack of coordination: Secondary | ICD-10-CM | POA: Diagnosis not present

## 2016-04-02 DIAGNOSIS — Z79899 Other long term (current) drug therapy: Secondary | ICD-10-CM | POA: Diagnosis not present

## 2016-04-02 DIAGNOSIS — M6289 Other specified disorders of muscle: Secondary | ICD-10-CM | POA: Diagnosis not present

## 2016-04-02 LAB — CBC AND DIFFERENTIAL
HCT: 46 % (ref 36–46)
Hemoglobin: 13.4 g/dL (ref 12.0–16.0)
Platelets: 150 K/µL (ref 150–399)
WBC: 13 10*3/mL

## 2016-04-02 LAB — BASIC METABOLIC PANEL WITH GFR
BUN: 39 mg/dL — AB (ref 4–21)
Creatinine: 0.6 mg/dL (ref 0.5–1.1)
Glucose: 115 mg/dL
Potassium: 4.9 mmol/L (ref 3.4–5.3)
Sodium: 145 mmol/L (ref 137–147)

## 2016-04-03 DIAGNOSIS — R269 Unspecified abnormalities of gait and mobility: Secondary | ICD-10-CM | POA: Diagnosis not present

## 2016-04-03 DIAGNOSIS — M6289 Other specified disorders of muscle: Secondary | ICD-10-CM | POA: Diagnosis not present

## 2016-04-03 DIAGNOSIS — R278 Other lack of coordination: Secondary | ICD-10-CM | POA: Diagnosis not present

## 2016-04-05 ENCOUNTER — Non-Acute Institutional Stay (SKILLED_NURSING_FACILITY): Payer: Commercial Managed Care - HMO | Admitting: Family

## 2016-04-05 DIAGNOSIS — M6289 Other specified disorders of muscle: Secondary | ICD-10-CM | POA: Diagnosis not present

## 2016-04-05 DIAGNOSIS — R269 Unspecified abnormalities of gait and mobility: Secondary | ICD-10-CM | POA: Diagnosis not present

## 2016-04-05 DIAGNOSIS — D72829 Elevated white blood cell count, unspecified: Secondary | ICD-10-CM | POA: Diagnosis not present

## 2016-04-05 DIAGNOSIS — J441 Chronic obstructive pulmonary disease with (acute) exacerbation: Secondary | ICD-10-CM | POA: Diagnosis not present

## 2016-04-05 DIAGNOSIS — M545 Low back pain: Secondary | ICD-10-CM | POA: Diagnosis not present

## 2016-04-05 DIAGNOSIS — R278 Other lack of coordination: Secondary | ICD-10-CM | POA: Diagnosis not present

## 2016-04-05 NOTE — Progress Notes (Signed)
Location:  [facility] Room Number: 123.8 lbs  Place of Service:  SNF (31) Provider: Dequincy Born FNP-C   Blanchie Serve, MD  Patient Care Team: Blanchie Serve, MD as PCP - General (Internal Medicine)  Extended Emergency Contact Information Primary Emergency Contact: Tat Momoli,[name] Address: [address]          [place],  [address] [name] Mobile Phone: [phone] Relation: Son Secondary Emergency Contact: [name] [place] Mobile Phone: [phone] Relation: Relative  Code Status:  DNR  Goals of care: Advanced Directive information Advanced Directives 03/27/2016  Does Patient Have a Medical Advance Directive? Yes  Type of Advance Directive Out of facility DNR (pink MOST or yellow form)  Does patient want to make changes to medical advance directive? No - Patient declined  Copy of [place] in Chart? -  Would patient like information on creating a medical advance directive? -  Pre-existing out of facility DNR order (yellow form or pink MOST form) -     Chief Complaint  Patient presents with  . Acute Visit    leukocytosis     HPI:  Pt is a 76 y.o. female seen today at [facility] for an acute visit for evaluation of abnormal labs.She has a medical history of HTN, CAD, Afib CHF, COPD among other conditions.She is seen in her room today. Her recent lab results showed WBC 13.0 (04/02/2016).She is status post hospital admission from 03/21/2016- 03/27/2016 with hematochezia and acute diastolic heart failure. She states feeling much better but cough persist.she states has to keep HOB elevated at 90 degrees to prevent shortness of breath. She denies any fever or chills.    Past Medical History:  Diagnosis Date  . Anemia   . Arthritis    "back" (11/21/2014)  . Asthma   . Blood dyscrasia    hx blood clotts  . Chronic bronchitis ([place])   . Chronic kidney disease   .  Chronic lower back pain   . 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 GERD (gastroesophageal reflux disease)   . [id number]    "a few times/year" (11/21/2014)  . History of blood transfusion X 2   "related to blood thinner I was taking"  . Hyperlipidemia   . Hypertension   . Migraine    "weekly probably" (11/21/2014)  . On home oxygen therapy    "3L; 24/7" (11/21/2014)  . Pneumonia    hx pneumonia   . Pneumothorax on right 09/2014  . Pulmonary embolism ([place])   . Rectal bleed 03/2016  . Shortness of breath   . Urinary incontinence    Past Surgical History:  Procedure Laterality Date  . ABDOMINAL HYSTERECTOMY    . CATARACT EXTRACTION W/ INTRAOCULAR LENS  IMPLANT, BILATERAL Bilateral   . CYSTOSCOPY W/ RETROGRADES Bilateral 06/11/2014   Procedure: CYSTOSCOPY WITH RETROGRADE PYELOGRAM;  Surgeon: [name], MD;  [facility]: [facility];  Service: Urology;  Laterality: Bilateral;  . FRACTURE SURGERY    . HARDWARE REMOVAL  01/29/2012   Procedure: HARDWARE REMOVAL;  Surgeon: [name], MD;  Location: [facility];  Service: Orthopedics;  Laterality: Right;  . HEMIARTHROPLASTY SHOULDER FRACTURE Right   . HIP ARTHROPLASTY  01/29/2012   Procedure: ARTHROPLASTY BIPOLAR HIP;  Surgeon: [name], MD;  Location: [facility];  Service: Orthopedics;  Laterality: Right;  . LAPAROSCOPIC CHOLECYSTECTOMY    . ORIF WRIST FRACTURE  10/13/2011   Procedure: OPEN REDUCTION INTERNAL FIXATION (ORIF) WRIST FRACTURE;  Surgeon: [name], MD;  Location: [facility];  Service: Orthopedics;  Laterality: Right;  Open Reduction Internal Fixation Right Distal Radius   . TRANSURETHRAL RESECTION OF BLADDER TUMOR N/A 06/11/2014   Procedure: TRANSURETHRAL RESECTION OF BLADDER TUMOR (TURBT);  Surgeon: [name], MD;  [facility];  Service: Urology;  Laterality: N/A;  . TUBAL LIGATION      Allergies  Allergen Reactions  . Eggs Or Egg-Derived Products Other (See Comments)    Listed on MAR     Allergies as of 04/05/2016      Reactions   Eggs Or Egg-derived Products Other (See Comments)   Listed on [facility]      Medication List       Accurate as of 04/05/16  5:46 PM. Always use your most recent med list.          albuterol 0.63 MG/3ML nebulizer solution Commonly known as:  ACCUNEB Take 1 ampule by nebulization every 6 (six) hours as needed for wheezing.   atorvastatin 40 MG tablet Commonly known as:  LIPITOR Take 40 mg by mouth daily at 6 PM.   AZO CRANBERRY PO Take 2 capsules by mouth every evening.   calcitonin (salmon) 200 UNIT/ACT nasal spray Commonly known as:  MIACALCIN/FORTICAL Place 1 spray into alternate nostrils daily.   cyanocobalamin 500 MCG tablet Take 500 mcg by mouth daily.   dabigatran 150 MG Caps capsule Commonly known as:  PRADAXA Take 150 mg by mouth 2 (two) times daily.   digoxin 0.125 MG tablet Commonly known as:  LANOXIN Take 1 tablet (0.125 mg total) by mouth daily.   docusate sodium 100 MG capsule Commonly known as:  COLACE Take 1 capsule (100 mg total) by mouth every 12 (twelve) hours.   feeding supplement (PRO-STAT SUGAR FREE 64) Liqd Take 30 mLs by mouth 2 (two) times daily.   ferrous sulfate 325 (65 FE) MG tablet Take 325 mg by mouth daily with breakfast.   fluticasone 50 MCG/ACT nasal spray Commonly known as:  FLONASE Place 1 spray into both nostrils daily as needed for allergies or rhinitis.   furosemide 40 MG tablet Commonly known as:  LASIX Take 1 tablet (40 mg total) by mouth daily.   guaiFENesin 600 MG 12 hr tablet Commonly known as:  MUCINEX Take 2 tablets (1,200 mg total) by mouth 2 (two) times daily.   HYDROcodone-acetaminophen 5-325 MG tablet Commonly known as:  NORCO/VICODIN Take 1 tablet by mouth every 4 (four) hours as needed for moderate pain.   metoprolol 50 MG tablet Commonly known as:  LOPRESSOR Take 1 tablet (50 mg total) by mouth 2 (two) times daily.   multivitamin with minerals tablet Take  1 tablet by mouth daily.   OVER THE COUNTER MEDICATION Take 120 mLs by mouth 2 (two) times daily. Med pass   oxybutynin 10 MG 24 hr tablet Commonly known as:  DITROPAN-XL Take 10 mg by mouth at bedtime.   OXYGEN Inhale 3 L into the lungs daily as needed (for SOB).   pantoprazole 40 MG tablet Commonly known as:  PROTONIX Take 1 tablet (40 mg total) by mouth daily.   potassium chloride SA 20 MEQ tablet Commonly known as:  K-DUR,KLOR-CON Take 20 mEq by mouth daily.   sertraline 25 MG tablet Commonly known as:  ZOLOFT Take 1 tablet (25 mg total) by mouth at bedtime.   tiotropium 18 MCG inhalation capsule Commonly known as:  SPIRIVA Place 1 capsule (18 mcg total) into inhaler and inhale daily.   tiZANidine 2 MG tablet Commonly known as:  ZANAFLEX Take 2 mg by mouth at bedtime.   traMADol 50 MG tablet Commonly known as:  ULTRAM Take 25 mg by mouth every 8 (eight) hours. Hold if lethargic or confused       Review of Systems  Constitutional: Negative for appetite change, chills and fever.  HENT: Negative for congestion, rhinorrhea, sinus pressure, sneezing and sore throat.   Eyes: Negative.   Respiratory: Positive for cough. Negative for chest tightness and wheezing.        Keeps HOB elevated due to shortness of breath.Oxygen via [place]   Cardiovascular: Negative for chest pain, palpitations and leg swelling.  Gastrointestinal: Negative for abdominal distention, abdominal pain, constipation and diarrhea.  Genitourinary: Negative for dysuria, frequency and urgency.  Musculoskeletal: Positive for gait problem.  Skin: Negative for pallor and rash.  Neurological: Negative for dizziness, seizures, syncope, light-headedness and headaches.  Psychiatric/Behavioral: Negative for agitation, confusion, hallucinations and sleep disturbance. The patient is not nervous/anxious.     Immunization History  Administered Date(s) Administered  . Influenza-Unspecified 11/13/2013, 12/15/2015   . PPD Test 12/16/2015   Pertinent  Health Maintenance Due  Topic Date Due  . COLONOSCOPY  06/09/1991  . PNA vac Low Risk Adult (1 of 2 - PCV13) 06/09/2006  . MAMMOGRAM  11/19/2017  . INFLUENZA VACCINE  Completed  . DEXA SCAN  Completed   Fall Risk  12/31/2015 05/22/2015 02/20/2015 11/19/2014 10/07/2014  Falls in the past year? No No No No No  Risk for fall due to : Impaired mobility History of fall(s) Impaired balance/gait;History of fall(s) Impaired balance/gait;History of fall(s);Impaired mobility History of fall(s);Impaired balance/gait  Risk for fall due to (comments): - - - - -      Vitals:   04/05/16 1200  BP: (!) 144/82  Pulse: 64  Resp: 18  Temp: 97.6 F (36.4 C)  SpO2: 97%  Weight: 123 lb 12.8 oz (56.2 kg)  Height: 5\' 1"  (1.549 m)   Body mass index is 23.39 kg/m. Physical Exam  Constitutional: She is oriented to person, place, and time. She appears well-developed and well-nourished. No distress.  HENT:  Head: Normocephalic.  Mouth/Throat: Oropharynx is clear and moist. No oropharyngeal exudate.  Eyes: Conjunctivae and EOM are normal. Pupils are equal, round, and reactive to light. Right eye exhibits no discharge. Left eye exhibits no discharge. No scleral icterus.  Neck: Normal range of motion. No JVD present. No thyromegaly present.  Cardiovascular: Normal rate, regular rhythm, normal heart sounds and intact distal pulses.  Exam reveals no gallop and no friction rub.   No murmur heard. Pulmonary/Chest: Effort normal. No respiratory distress. She has no wheezes. She has no rales.  Bilateral diminished bases.Oxygen 2 Liters via [place]   Abdominal: Soft. Bowel sounds are normal. She exhibits no distension. There is no tenderness. There is no rebound and no guarding.  Musculoskeletal: She exhibits no edema, tenderness or deformity.  Unsteady gait   Lymphadenopathy:    She has no cervical adenopathy.  Neurological: She is oriented to person, place, and time.  Skin: Skin is  warm and dry. No erythema. No pallor.  Psychiatric: She has a normal mood and affect.    Labs reviewed:  Recent Labs  01/17/16 0922  03/21/16 1218 03/21/16 1705 03/22/16 0347 03/24/16 0545  NA  --   < > 141  --  141 143  K  --   < >  4.8  --  3.7 4.5  CL  --   < > 98*  --  100* 102  CO2  --   < > 34*  --  33* 34*  GLUCOSE  --   < > 111*  --  94 92  BUN  --   < > 14  --  11 11  CREATININE  --   < > 0.73  --  0.56 0.62  CALCIUM  --   < > 9.0  --  8.7* 8.4*  MG 1.8  --   --  1.8  --   --   PHOS  --   --   --  3.9  --   --   < > = values in this interval not displayed.  Recent Labs  01/22/16 0329 03/21/16 1218 03/22/16 0347  AST 20 28 22   ALT 27 18 17   ALKPHOS 49 62 55  BILITOT 0.6 1.0 0.5  PROT 4.9* 6.1* 5.5*  ALBUMIN 2.5* 2.9* 2.7*    Recent Labs  12/10/15 1526  01/17/16 0555  03/21/16 1218  03/23/16 0900 03/24/16 0545 03/26/16 0630  WBC 9.6  < > 8.7  < > 11.2*  < > 13.6* 11.7* 14.2*  NEUTROABS 7.9*  --  6.9  --  8.8*  --   --   --   --   HGB 11.8*  < > 13.8  < > 12.9  < > 12.8 12.4 12.7  HCT 40.8  < > 47.9*  < > 44.6  < > 44.0 43.0 44.5  MCV 90.9  < > 93.7  < > 91.2  < > 92.2 92.3 93.1  PLT 142*  < > 140*  < > 145*  < > 133* 128* 113*  < > = values in this interval not displayed. Lab Results  Component Value Date   TSH 1.249 01/17/2016   No results found for: HGBA1C Lab Results  Component Value Date   CHOL 102 08/05/2013   HDL 47 08/05/2013   LDLCALC 42 08/05/2013   TRIG 66 08/05/2013   CHOLHDL 2.2 08/05/2013   Assessment/Plan Leukocytosis  Afebrile. Cough persist post 03/21/2016- 03/27/2016 with hematochezia and acute diastolic heart failure.Exam findings negative for edema, wheezes or rales. Shortness of breath with exertion. Will obtain Portable CXR Pa/Lat rule PNA . Will also obtain urine specimen for U/A and C/S rule out UTI. CBC/diff 04/12/2016    Family/ staff Communication: Reviewed plan of care with patient and facility Nurse  supervisor.  Labs/tests ordered: Portable CXR Pa/Lat rule PNA and CBC/diff 04/12/2016

## 2016-04-06 DIAGNOSIS — R269 Unspecified abnormalities of gait and mobility: Secondary | ICD-10-CM | POA: Diagnosis not present

## 2016-04-06 DIAGNOSIS — R278 Other lack of coordination: Secondary | ICD-10-CM | POA: Diagnosis not present

## 2016-04-06 DIAGNOSIS — M6289 Other specified disorders of muscle: Secondary | ICD-10-CM | POA: Diagnosis not present

## 2016-04-07 DIAGNOSIS — R278 Other lack of coordination: Secondary | ICD-10-CM | POA: Diagnosis not present

## 2016-04-07 DIAGNOSIS — M6289 Other specified disorders of muscle: Secondary | ICD-10-CM | POA: Diagnosis not present

## 2016-04-07 DIAGNOSIS — R404 Transient alteration of awareness: Secondary | ICD-10-CM | POA: Diagnosis not present

## 2016-04-07 DIAGNOSIS — R269 Unspecified abnormalities of gait and mobility: Secondary | ICD-10-CM | POA: Diagnosis not present

## 2016-04-08 DIAGNOSIS — M6289 Other specified disorders of muscle: Secondary | ICD-10-CM | POA: Diagnosis not present

## 2016-04-08 DIAGNOSIS — R278 Other lack of coordination: Secondary | ICD-10-CM | POA: Diagnosis not present

## 2016-04-08 DIAGNOSIS — R109 Unspecified abdominal pain: Secondary | ICD-10-CM | POA: Diagnosis not present

## 2016-04-08 DIAGNOSIS — R269 Unspecified abnormalities of gait and mobility: Secondary | ICD-10-CM | POA: Diagnosis not present

## 2016-04-09 DIAGNOSIS — M545 Low back pain: Secondary | ICD-10-CM | POA: Diagnosis not present

## 2016-04-09 DIAGNOSIS — M6289 Other specified disorders of muscle: Secondary | ICD-10-CM | POA: Diagnosis not present

## 2016-04-09 DIAGNOSIS — R269 Unspecified abnormalities of gait and mobility: Secondary | ICD-10-CM | POA: Diagnosis not present

## 2016-04-09 DIAGNOSIS — R278 Other lack of coordination: Secondary | ICD-10-CM | POA: Diagnosis not present

## 2016-04-10 DIAGNOSIS — M6289 Other specified disorders of muscle: Secondary | ICD-10-CM | POA: Diagnosis not present

## 2016-04-10 DIAGNOSIS — R269 Unspecified abnormalities of gait and mobility: Secondary | ICD-10-CM | POA: Diagnosis not present

## 2016-04-10 DIAGNOSIS — R278 Other lack of coordination: Secondary | ICD-10-CM | POA: Diagnosis not present

## 2016-04-12 DIAGNOSIS — I10 Essential (primary) hypertension: Secondary | ICD-10-CM | POA: Diagnosis not present

## 2016-04-12 DIAGNOSIS — I130 Hypertensive heart and chronic kidney disease with heart failure and stage 1 through stage 4 chronic kidney disease, or unspecified chronic kidney disease: Secondary | ICD-10-CM | POA: Diagnosis not present

## 2016-04-12 LAB — CBC AND DIFFERENTIAL
HEMATOCRIT: 44 % (ref 36–46)
Hemoglobin: 12.8 g/dL (ref 12.0–16.0)
PLATELETS: 73 10*3/uL — AB (ref 150–399)
WBC: 14.3 10*3/mL

## 2016-04-13 ENCOUNTER — Non-Acute Institutional Stay (SKILLED_NURSING_FACILITY): Payer: Medicare Other | Admitting: Family

## 2016-04-13 DIAGNOSIS — R600 Localized edema: Secondary | ICD-10-CM

## 2016-04-13 MED ORDER — FUROSEMIDE 40 MG PO TABS: 40.0000 mg | ORAL_TABLET | Freq: Two times a day (BID) | ORAL | 1 refills | Status: DC

## 2016-04-13 NOTE — Progress Notes (Addendum)
Location:  [facility] Room Number: [place]:  SNF (31) Provider: Dinah Ngetich FNP-C   Blanchie Serve, MD  Patient Care Team: Blanchie Serve, MD as PCP - General (Internal Medicine)  Extended Emergency Contact Information Primary Emergency Contact: [name] Address: [address]          [place],  [address] [name] Mobile Phone: [phone] Relation: Son Secondary Emergency Contact: [name] [place] Mobile Phone: [phone] Relation: Relative  Code Status:  DNR  Goals of care: Advanced Directive information Advanced Directives 03/27/2016  Does Patient Have a Medical Advance Directive? Yes  Type of Advance Directive Out of facility DNR (pink MOST or yellow form)  Does patient want to make changes to medical advance directive? No - Patient declined  Copy of [place] in Chart? -  Would patient like information on creating a medical advance directive? -  Pre-existing out of facility DNR order (yellow form or pink MOST form) -     Chief Complaint  Patient presents with  . Acute Visit    edema     HPI:  Pt is a 76 y.o. female seen today at [facility] for an acute visit for evaluation of edema.She has a significant medical history of HTN, CAD, Afib CHF, COPD among other conditions.She is seen in her room today.Facility Nurse reports patient has worsening edema on LE and UE.Edema worse on left arm.She denies any shortness, wheezing fever or chills.   Past Medical History:  Diagnosis Date  . Anemia   . Arthritis    "back" (11/21/2014)  . Asthma   . Blood dyscrasia    hx blood clotts  . Chronic bronchitis ([place])   . Chronic kidney disease   . Chronic lower back pain   . 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 GERD (gastroesophageal reflux disease)   . [id number]    "a few times/year" (11/21/2014)  .  History of blood transfusion X 2   "related to blood thinner I was taking"  . Hyperlipidemia   . Hypertension   . Migraine    "weekly probably" (11/21/2014)  . On home oxygen therapy    "3L; 24/7" (11/21/2014)  . Pneumonia    hx pneumonia   . Pneumothorax on right 09/2014  . Pulmonary embolism ([place])   . Rectal bleed 03/2016  . Shortness of breath   . Urinary incontinence    Past Surgical History:  Procedure Laterality Date  . ABDOMINAL HYSTERECTOMY    . CATARACT EXTRACTION W/ INTRAOCULAR LENS  IMPLANT, BILATERAL Bilateral   . CYSTOSCOPY W/ RETROGRADES Bilateral 06/11/2014   Procedure: CYSTOSCOPY WITH RETROGRADE PYELOGRAM;  Surgeon: [name], MD;  [facility];  Service: Urology;  Laterality: Bilateral;  . FRACTURE SURGERY    . HARDWARE REMOVAL  01/29/2012   Procedure: HARDWARE REMOVAL;  Surgeon: [name], MD;  Location: [facility];  Service: Orthopedics;  Laterality: Right;  . HEMIARTHROPLASTY SHOULDER FRACTURE Right   . HIP ARTHROPLASTY  01/29/2012   Procedure: ARTHROPLASTY BIPOLAR HIP;  Surgeon: [name], MD;  Location: [facility];  Service: Orthopedics;  Laterality: Right;  . LAPAROSCOPIC CHOLECYSTECTOMY    . ORIF WRIST FRACTURE  10/13/2011   Procedure: OPEN REDUCTION INTERNAL FIXATION (ORIF) WRIST FRACTURE;  Surgeon: [name], MD;  Location: [facility];  Service: Orthopedics;  Laterality: Right;  Open Reduction  Internal Fixation Right Distal Radius   . TRANSURETHRAL RESECTION OF BLADDER TUMOR N/A 06/11/2014   Procedure: TRANSURETHRAL RESECTION OF BLADDER TUMOR (TURBT);  Surgeon: [name], MD;  [facility];  Service: Urology;  Laterality: N/A;  . TUBAL LIGATION      Allergies  Allergen Reactions  . Eggs Or Egg-Derived Products Other (See Comments)    Listed on MAR    Allergies as of 04/13/2016      Reactions   Eggs Or Egg-derived Products Other (See Comments)   Listed on [facility]      Medication List       Accurate as of 04/13/16  8:51 PM. Always use your  most recent med list.          acetaminophen 650 MG CR tablet Commonly known as:  TYLENOL Take 650 mg by mouth every 8 (eight) hours.   albuterol 0.63 MG/3ML nebulizer solution Commonly known as:  ACCUNEB Take 1 ampule by nebulization every 6 (six) hours as needed for wheezing.   atorvastatin 40 MG tablet Commonly known as:  LIPITOR Take 40 mg by mouth daily at 6 PM.   AZO CRANBERRY PO Take 2 capsules by mouth every evening.   calcitonin (salmon) 200 UNIT/ACT nasal spray Commonly known as:  MIACALCIN/FORTICAL Place 1 spray into alternate nostrils daily.   cyanocobalamin 500 MCG tablet Take 500 mcg by mouth daily.   dabigatran 150 MG Caps capsule Commonly known as:  PRADAXA Take 150 mg by mouth 2 (two) times daily.   digoxin 0.125 MG tablet Commonly known as:  LANOXIN Take 1 tablet (0.125 mg total) by mouth daily.   docusate sodium 100 MG capsule Commonly known as:  COLACE Take 1 capsule (100 mg total) by mouth every 12 (twelve) hours.   feeding supplement (PRO-STAT SUGAR FREE 64) Liqd Take 30 mLs by mouth 2 (two) times daily.   ferrous sulfate 325 (65 FE) MG tablet Take 325 mg by mouth daily with breakfast.   fluticasone 50 MCG/ACT nasal spray Commonly known as:  FLONASE Place 1 spray into both nostrils daily as needed for allergies or rhinitis.   furosemide 40 MG tablet Commonly known as:  LASIX Take 1 tablet (40 mg total) by mouth 2 (two) times daily. Then start 40 mg Tablet once daily.   guaiFENesin 600 MG 12 hr tablet Commonly known as:  MUCINEX Take 2 tablets (1,200 mg total) by mouth 2 (two) times daily.   HYDROcodone-acetaminophen 5-325 MG tablet Commonly known as:  NORCO/VICODIN Take 1 tablet by mouth every 4 (four) hours as needed for moderate pain.   metoprolol 50 MG tablet Commonly known as:  LOPRESSOR Take 1 tablet (50 mg total) by mouth 2 (two) times daily.   multivitamin with minerals tablet Take 1 tablet by mouth daily.   OVER THE  COUNTER MEDICATION Take 120 mLs by mouth 2 (two) times daily. Med pass   oxybutynin 10 MG 24 hr tablet Commonly known as:  DITROPAN-XL Take 10 mg by mouth at bedtime.   OXYGEN Inhale 3 L into the lungs daily as needed (for SOB).   pantoprazole 40 MG tablet Commonly known as:  PROTONIX Take 1 tablet (40 mg total) by mouth daily.   potassium chloride SA 20 MEQ tablet Commonly known as:  K-DUR,KLOR-CON Take 20 mEq by mouth daily.   sertraline 25 MG tablet Commonly known as:  ZOLOFT Take 1 tablet (25 mg total) by mouth at bedtime.   tiotropium 18 MCG inhalation capsule Commonly known  as:  SPIRIVA Place 1 capsule (18 mcg total) into inhaler and inhale daily.   tiZANidine 2 MG tablet Commonly known as:  ZANAFLEX Take 2 mg by mouth at bedtime.   traMADol 50 MG tablet Commonly known as:  ULTRAM Take 25 mg by mouth every 8 (eight) hours. Hold if lethargic or confused       Review of Systems  Constitutional: Negative for appetite change, chills and fever.  HENT: Negative for congestion, rhinorrhea, sinus pressure, sneezing and sore throat.   Eyes: Negative.   Respiratory: Negative for cough, chest tightness and wheezing.        Oxygen via [place]   Cardiovascular: Negative for chest pain, palpitations and leg swelling.  Gastrointestinal: Negative for abdominal distention, abdominal pain, constipation and diarrhea.  Genitourinary: Negative for dysuria, frequency and urgency.  Musculoskeletal: Positive for gait problem.  Skin: Negative for pallor and rash.  Neurological: Negative for dizziness, seizures, syncope, light-headedness and headaches.  Psychiatric/Behavioral: Negative for agitation, confusion, hallucinations and sleep disturbance. The patient is not nervous/anxious.     Immunization History  Administered Date(s) Administered  . Influenza-Unspecified 11/13/2013, 12/15/2015  . PPD Test 12/16/2015   Pertinent  Health Maintenance Due  Topic Date Due  . COLONOSCOPY   06/09/1991  . PNA vac Low Risk Adult (1 of 2 - PCV13) 06/09/2006  . MAMMOGRAM  11/19/2017  . INFLUENZA VACCINE  Completed  . DEXA SCAN  Completed   Fall Risk  12/31/2015 05/22/2015 02/20/2015 11/19/2014 10/07/2014  Falls in the past year? No No No No No  Risk for fall due to : Impaired mobility History of fall(s) Impaired balance/gait;History of fall(s) Impaired balance/gait;History of fall(s);Impaired mobility History of fall(s);Impaired balance/gait  Risk for fall due to (comments): - - - - -      Vitals:   04/13/16 1100  BP: (!) 141/84  Pulse: 87  Resp: 18  Temp: 97.4 F (36.3 C)  SpO2: 99%  Weight: 123 lb 12.8 oz (56.2 kg)  Height: 5\' 1"  (1.549 m)   Body mass index is 23.39 kg/m. Physical Exam  Constitutional: She is oriented to person, place, and time. She appears well-developed and well-nourished. No distress.  HENT:  Head: Normocephalic.  Mouth/Throat: Oropharynx is clear and moist. No oropharyngeal exudate.  Eyes: Conjunctivae and EOM are normal. Pupils are equal, round, and reactive to light. Right eye exhibits no discharge. Left eye exhibits no discharge. No scleral icterus.  Neck: Normal range of motion. No JVD present. No thyromegaly present.  Cardiovascular: Normal rate, regular rhythm, normal heart sounds and intact distal pulses.  Exam reveals no gallop and no friction rub.   No murmur heard. Pulmonary/Chest: Effort normal. No respiratory distress. She has no wheezes. She has no rales.  Bilateral diminished bases.Oxygen 2 Liters via [place]   Abdominal: Soft. Bowel sounds are normal. She exhibits no distension. There is no tenderness. There is no rebound and no guarding.  Musculoskeletal: She exhibits edema. She exhibits no tenderness or deformity.  Moves X 4 extremities. Bilat. LE and UE edema.   Lymphadenopathy:    She has no cervical adenopathy.  Neurological: She is oriented to person, place, and time.  Skin: Skin is warm and dry. No erythema. No pallor.    Psychiatric: She has a normal mood and affect.    Labs reviewed:  Recent Labs  01/17/16 0922  03/21/16 1218 03/21/16 1705 03/22/16 0347 03/24/16 0545  NA  --   < > 141  --  141 143  K  --   < > 4.8  --  3.7 4.5  CL  --   < > 98*  --  100* 102  CO2  --   < > 34*  --  33* 34*  GLUCOSE  --   < > 111*  --  94 92  BUN  --   < > 14  --  11 11  CREATININE  --   < > 0.73  --  0.56 0.62  CALCIUM  --   < > 9.0  --  8.7* 8.4*  MG 1.8  --   --  1.8  --   --   PHOS  --   --   --  3.9  --   --   < > = values in this interval not displayed.  Recent Labs  01/22/16 0329 03/21/16 1218 03/22/16 0347  AST 20 28 22   ALT 27 18 17   ALKPHOS 49 62 55  BILITOT 0.6 1.0 0.5  PROT 4.9* 6.1* 5.5*  ALBUMIN 2.5* 2.9* 2.7*    Recent Labs  12/10/15 1526  01/17/16 0555  03/21/16 1218  03/23/16 0900 03/24/16 0545 03/26/16 0630  WBC 9.6  < > 8.7  < > 11.2*  < > 13.6* 11.7* 14.2*  NEUTROABS 7.9*  --  6.9  --  8.8*  --   --   --   --   HGB 11.8*  < > 13.8  < > 12.9  < > 12.8 12.4 12.7  HCT 40.8  < > 47.9*  < > 44.6  < > 44.0 43.0 44.5  MCV 90.9  < > 93.7  < > 91.2  < > 92.2 92.3 93.1  PLT 142*  < > 140*  < > 145*  < > 133* 128* 113*  < > = values in this interval not displayed. Lab Results  Component Value Date   TSH 1.249 01/17/2016   No results found for: HGBA1C Lab Results  Component Value Date   CHOL 102 08/05/2013   HDL 47 08/05/2013   LDLCALC 42 08/05/2013   TRIG 66 08/05/2013   CHOLHDL 2.2 08/05/2013   Assessment/Plan Edema  Bilateral LE's and UE 2+ increase Furosemide 40 mg tablet twice daily X 3 days then decreased to 40 mg Tablet daily. Recheck BMP 04/19/2016.      Family/ staff Communication: Reviewed plan of care with patient and facility Nurse supervisor.  Labs/tests ordered: Recheck BMP 04/19/2016.

## 2016-04-14 DIAGNOSIS — M545 Low back pain: Secondary | ICD-10-CM | POA: Diagnosis not present

## 2016-04-19 DIAGNOSIS — Z79899 Other long term (current) drug therapy: Secondary | ICD-10-CM | POA: Diagnosis not present

## 2016-04-19 LAB — BASIC METABOLIC PANEL
BUN: 16 mg/dL (ref 4–21)
Creatinine: 0.6 mg/dL (ref 0.5–1.1)
Glucose: 106 mg/dL
Potassium: 5.4 mmol/L — AB (ref 3.4–5.3)
Sodium: 147 mmol/L (ref 137–147)

## 2016-04-21 DIAGNOSIS — M545 Low back pain: Secondary | ICD-10-CM | POA: Diagnosis not present

## 2016-04-23 ENCOUNTER — Emergency Department (HOSPITAL_COMMUNITY)

## 2016-04-23 ENCOUNTER — Emergency Department (HOSPITAL_COMMUNITY)
Admission: EM | Admit: 2016-04-23 | Discharge: 2016-04-24 | Disposition: A | Discharge status: Home / Self Care | Attending: Emergency Medicine | Admitting: Emergency Medicine

## 2016-04-23 ENCOUNTER — Encounter (HOSPITAL_COMMUNITY): Payer: Self-pay

## 2016-04-23 DIAGNOSIS — J441 Chronic obstructive pulmonary disease with (acute) exacerbation: Secondary | ICD-10-CM | POA: Diagnosis not present

## 2016-04-23 DIAGNOSIS — Z79899 Other long term (current) drug therapy: Secondary | ICD-10-CM | POA: Diagnosis not present

## 2016-04-23 DIAGNOSIS — Z7901 Long term (current) use of anticoagulants: Secondary | ICD-10-CM | POA: Insufficient documentation

## 2016-04-23 DIAGNOSIS — I5033 Acute on chronic diastolic (congestive) heart failure: Secondary | ICD-10-CM | POA: Insufficient documentation

## 2016-04-23 DIAGNOSIS — N189 Chronic kidney disease, unspecified: Secondary | ICD-10-CM | POA: Diagnosis not present

## 2016-04-23 DIAGNOSIS — I130 Hypertensive heart and chronic kidney disease with heart failure and stage 1 through stage 4 chronic kidney disease, or unspecified chronic kidney disease: Secondary | ICD-10-CM | POA: Diagnosis not present

## 2016-04-23 DIAGNOSIS — R079 Chest pain, unspecified: Secondary | ICD-10-CM | POA: Diagnosis not present

## 2016-04-23 DIAGNOSIS — E873 Alkalosis: Secondary | ICD-10-CM | POA: Diagnosis not present

## 2016-04-23 DIAGNOSIS — R0602 Shortness of breath: Secondary | ICD-10-CM | POA: Diagnosis present

## 2016-04-23 DIAGNOSIS — I2510 Atherosclerotic heart disease of native coronary artery without angina pectoris: Secondary | ICD-10-CM | POA: Diagnosis not present

## 2016-04-23 DIAGNOSIS — J189 Pneumonia, unspecified organism: Secondary | ICD-10-CM | POA: Insufficient documentation

## 2016-04-23 DIAGNOSIS — Z87891 Personal history of nicotine dependence: Secondary | ICD-10-CM | POA: Diagnosis not present

## 2016-04-23 DIAGNOSIS — R069 Unspecified abnormalities of breathing: Secondary | ICD-10-CM | POA: Diagnosis not present

## 2016-04-23 LAB — BASIC METABOLIC PANEL
Anion gap: 11 (ref 5–15)
BUN: 15 mg/dL (ref 4–21)
BUN: 15 mg/dL (ref 4–21)
BUN: 15 mg/dL (ref 6–20)
CALCIUM: 8.9 mg/dL (ref 8.9–10.3)
CO2: 37 mmol/L — AB (ref 22–32)
CREATININE: 0.53 mg/dL (ref 0.44–1.00)
Chloride: 96 mmol/L — ABNORMAL LOW (ref 101–111)
Creatinine: 0.5 mg/dL (ref 0.5–1.1)
Creatinine: 0.5 mg/dL (ref 0.5–1.1)
GFR calc non Af Amer: >60 mL/min (ref >60)
GLUCOSE: 112 mg/dL — AB (ref 65–99)
Glucose: 106 mg/dL
Glucose: 106 mg/dL
Potassium: 5.3 mmol/L (ref 3.4–5.3)
Potassium: 4 mmol/L (ref 3.5–5.1)
Sodium: 144 mmol/L (ref 135–145)
Sodium: 150 mmol/L — AB (ref 137–147)
Sodium: 150 mmol/L — AB (ref 137–147)

## 2016-04-23 LAB — I-STAT VENOUS BLOOD GAS, ED
ACID-BASE EXCESS: 18 mmol/L — AB (ref 0.0–2.0)
Bicarbonate: 44.9 mmol/L — ABNORMAL HIGH (ref 20.0–28.0)
O2 SAT: 99 %
TCO2: 47 mmol/L (ref 0–100)
pCO2, Ven: 62 mmHg — ABNORMAL HIGH (ref 44.0–60.0)
pH, Ven: 7.468 — ABNORMAL HIGH (ref 7.250–7.430)
pO2, Ven: 117 mmHg — ABNORMAL HIGH (ref 32.0–45.0)

## 2016-04-23 LAB — CBC
HCT: 40.8 % (ref 36.0–46.0)
Hemoglobin: 11.5 g/dL — ABNORMAL LOW (ref 12.0–15.0)
MCH: 26.1 pg (ref 26.0–34.0)
MCHC: 28.2 g/dL — AB (ref 30.0–36.0)
MCV: 92.7 fL (ref 78.0–100.0)
Platelets: 120 10*3/uL — ABNORMAL LOW (ref 150–400)
RBC: 4.4 MIL/uL (ref 3.87–5.11)
RDW: 18.3 % — ABNORMAL HIGH (ref 11.5–15.5)
WBC: 10.7 10*3/uL — ABNORMAL HIGH (ref 4.0–10.5)

## 2016-04-23 LAB — I-STAT TROPONIN, ED: TROPONIN I, POC: 0.03 ng/mL (ref 0.00–0.08)

## 2016-04-23 MED ORDER — LEVOFLOXACIN 750 MG PO TABS: 750.0000 mg | ORAL_TABLET | Freq: Every day | ORAL | 0 refills | Status: DC

## 2016-04-23 MED ORDER — PREDNISONE 20 MG PO TABS
60.0000 mg | ORAL_TABLET | Freq: Once | ORAL | Status: AC
  Administered 2016-04-23: 60 mg via ORAL
  Filled 2016-04-23: qty 3

## 2016-04-23 MED ORDER — IPRATROPIUM-ALBUTEROL 0.5-2.5 (3) MG/3ML IN SOLN
3.0000 mL | RESPIRATORY_TRACT | Status: AC
Start: 2016-04-23 — End: 2016-04-23
  Administered 2016-04-23 (×3): 3 mL via RESPIRATORY_TRACT
  Filled 2016-04-23 (×4): qty 3

## 2016-04-23 MED ORDER — PREDNISONE 20 MG PO TABS: ORAL_TABLET | ORAL | 0 refills | Status: DC

## 2016-04-23 NOTE — ED Notes (Signed)
[facility] called  

## 2016-04-23 NOTE — ED Notes (Signed)
Patient transported to X-ray 

## 2016-04-23 NOTE — ED Notes (Signed)
EDP at bedside  

## 2016-04-23 NOTE — ED Triage Notes (Signed)
Pt arrives with [facility] from [facility] for abnormal Co2 levels. Lab work today reports a Co2 of 43.9 and on 2/5 it was 41.4. Pt has hx of chronic respiratory failure. Pt on 3L [place], lung sounds wheezy. NAD at this time.  100% on 3L [place]

## 2016-04-23 NOTE — ED Provider Notes (Signed)
[facility] DEPT Provider Note   CSN: [id number] Arrival date & time: 04/23/16  2000     History   Chief Complaint Chief Complaint  Patient presents with  . Shortness of Breath    Abnormal Labs (Co2)    HPI [name] [name] is a 76 y.o. female.  76 yo F with sob.  On hospice, having worsening co2 on the metabolic panel.  Patient feels that she has been having worsening cough, change in sputum and increased suptum production.  On 3L of O2 increased recently from 2.  She doesn't feel that its helping.     The history is provided by the patient.  Shortness of Breath  This is a new problem. The average episode lasts 2 weeks. The problem occurs frequently.The current episode started more than 2 days ago. The problem has not changed since onset.Associated symptoms include wheezing and chest pain. Pertinent negatives include no fever, no headaches, no rhinorrhea and no vomiting. She has tried nothing for the symptoms. Associated medical issues include COPD.    Past Medical History:  Diagnosis Date  . Anemia   . Arthritis    "back" (11/21/2014)  . Asthma   . Blood dyscrasia    hx blood clotts  . Chronic bronchitis ([place])   . Chronic kidney disease   . Chronic lower back pain   . 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 GERD (gastroesophageal reflux disease)   . [id number])    "a few times/year" (11/21/2014)  . History of blood transfusion X 2   "related to blood thinner I was taking"  . Hyperlipidemia   . Hypertension   . Migraine    "weekly probably" (11/21/2014)  . On home oxygen therapy    "3L; 24/7" (11/21/2014)  . Pneumonia    hx pneumonia   . Pneumothorax on right 09/2014  . Pulmonary embolism ([place])   . Rectal bleed 03/2016  . Shortness of breath   . Urinary incontinence     Patient Active Problem List   Diagnosis Date Noted  . Acute diastolic congestive heart failure ([place])   . Rectal bleeding 03/21/2016  . Malnutrition of moderate  degree 01/18/2016  . Acute exacerbation of chronic obstructive pulmonary disease (COPD) ([place]) 01/17/2016  . Chronic respiratory failure with hypoxia ([place]) 01/17/2016  . Pleural effusion 01/17/2016  . Atrial fibrillation ([place]) 12/10/2015  . Chronic diastolic congestive heart failure ([place]) 12/10/2015  . Dysuria 12/10/2015  . HLD (hyperlipidemia) 12/10/2015  . Depression (emotion) 12/10/2015  . GERD (gastroesophageal reflux disease) 12/10/2015  . Pneumothorax, right 11/20/2014  . Pneumothorax   . Recurrent spontaneous pneumothorax   . Pneumothorax on right 10/09/2014  . Pneumothorax on left 09/14/2014  . Essential hypertension   . Microcytic anemia 04/27/2014  . Anemia due to acute blood loss 08/03/2013  . Paroxysmal atrial fibrillation ([place]) 02/03/2012  . Fracture of right hip ([place]) 01/29/2012  . Fall due to stumbling 01/29/2012  . CAD (coronary artery disease) 01/29/2012  . 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 Osteoarthritis 01/29/2012  . Closed fracture of right distal radius 10/13/2011    Class: Acute    Past Surgical History:  Procedure Laterality Date  . ABDOMINAL HYSTERECTOMY    . CATARACT EXTRACTION W/ INTRAOCULAR LENS  IMPLANT, BILATERAL Bilateral   . CYSTOSCOPY W/ RETROGRADES Bilateral 06/11/2014   Procedure: CYSTOSCOPY WITH RETROGRADE PYELOGRAM;  Surgeon: [name], MD;  Location: [facility];  Service:  Urology;  Laterality: Bilateral;  . FRACTURE SURGERY    . HARDWARE REMOVAL  01/29/2012   Procedure: HARDWARE REMOVAL;  Surgeon: [name], MD;  Location: [facility];  Service: Orthopedics;  Laterality: Right;  . HEMIARTHROPLASTY SHOULDER FRACTURE Right   . HIP ARTHROPLASTY  01/29/2012   Procedure: ARTHROPLASTY BIPOLAR HIP;  Surgeon: [name], MD;  Location: [facility];  Service: Orthopedics;  Laterality: Right;  . LAPAROSCOPIC CHOLECYSTECTOMY    . ORIF WRIST FRACTURE  10/13/2011   Procedure: OPEN REDUCTION INTERNAL FIXATION  (ORIF) WRIST FRACTURE;  Surgeon: [name], MD;  Location: [facility];  Service: Orthopedics;  Laterality: Right;  Open Reduction Internal Fixation Right Distal Radius   . TRANSURETHRAL RESECTION OF BLADDER TUMOR N/A 06/11/2014   Procedure: TRANSURETHRAL RESECTION OF BLADDER TUMOR (TURBT);  Surgeon: [name], MD;  Location: [facility];  Service: Urology;  Laterality: N/A;  . TUBAL LIGATION      OB History    No data available       Home Medications    Prior to Admission medications   Medication Sig Start Date End Date Taking? Authorizing Provider  acetaminophen (TYLENOL) 650 MG CR tablet Take 650 mg by mouth every 8 (eight) hours.    Historical Provider, MD  albuterol (ACCUNEB) 0.63 MG/3ML nebulizer solution Take 1 ampule by nebulization every 6 (six) hours as needed for wheezing.    Historical Provider, MD  Amino Acids-Protein Hydrolys (FEEDING SUPPLEMENT, PRO-STAT SUGAR FREE 64,) LIQD Take 30 mLs by mouth 2 (two) times daily.    Historical Provider, MD  atorvastatin (LIPITOR) 40 MG tablet Take 40 mg by mouth daily at 6 PM.  07/02/13   Historical Provider, MD  calcitonin, salmon, (MIACALCIN/FORTICAL) 200 UNIT/ACT nasal spray Place 1 spray into alternate nostrils daily. 12/08/15   Historical Provider, MD  Cranberry-Vitamin C-Probiotic (AZO CRANBERRY PO) Take 2 capsules by mouth every evening.     Historical Provider, MD  cyanocobalamin 500 MCG tablet Take 500 mcg by mouth daily.    Historical Provider, MD  dabigatran (PRADAXA) 150 MG CAPS capsule Take 150 mg by mouth 2 (two) times daily.    Historical Provider, MD  digoxin (LANOXIN) 0.125 MG tablet Take 1 tablet (0.125 mg total) by mouth daily. 12/17/15   Verlee Monte, MD  docusate sodium (COLACE) 100 MG capsule Take 1 capsule (100 mg total) by mouth every 12 (twelve) hours. 10/21/15   Tanna Furry, MD  ferrous sulfate 325 (65 FE) MG tablet Take 325 mg by mouth daily with breakfast.    Historical Provider, MD  fluticasone (FLONASE) 50 MCG/ACT  nasal spray Place 1 spray into both nostrils daily as needed for allergies or rhinitis. 08/06/13   Barton Dubois, MD  furosemide (LASIX) 40 MG tablet Take 1 tablet (40 mg total) by mouth 2 (two) times daily. Then start 40 mg Tablet once daily. 04/13/16 04/16/16  Dinah C Ngetich, NP  guaiFENesin (MUCINEX) 600 MG 12 hr tablet Take 2 tablets (1,200 mg total) by mouth 2 (two) times daily. 12/16/15   Verlee Monte, MD  HYDROcodone-acetaminophen (NORCO/VICODIN) 5-325 MG tablet Take 1 tablet by mouth every 4 (four) hours as needed for moderate pain. 03/26/16   Shanker Kristeen Mans, MD  levofloxacin (LEVAQUIN) 750 MG tablet Take 1 tablet (750 mg total) by mouth daily. X 7 days 04/23/16   Deno Etienne, DO  metoprolol (LOPRESSOR) 50 MG tablet Take 1 tablet (50 mg total) by mouth 2 (two) times daily. 12/16/15   Verlee Monte,  MD  Multiple Vitamins-Minerals (MULTIVITAMIN WITH MINERALS) tablet Take 1 tablet by mouth daily.    Historical Provider, MD  OVER THE COUNTER MEDICATION Take 120 mLs by mouth 2 (two) times daily. Med pass    Historical Provider, MD  oxybutynin (DITROPAN-XL) 10 MG 24 hr tablet Take 10 mg by mouth at bedtime.    Historical Provider, MD  OXYGEN Inhale 3 L into the lungs daily as needed (for SOB).     Historical Provider, MD  pantoprazole (PROTONIX) 40 MG tablet Take 1 tablet (40 mg total) by mouth daily. [id number]   Delora Fuel, MD  potassium chloride SA (K-DUR,KLOR-CON) 20 MEQ tablet Take 20 mEq by mouth daily.    Historical Provider, MD  predniSONE (DELTASONE) 20 MG tablet 2 tabs po daily x 4 days 04/23/16   Deno Etienne, DO  sertraline (ZOLOFT) 25 MG tablet Take 1 tablet (25 mg total) by mouth at bedtime. 09/24/15   Juliet Rude, MD  tiotropium (SPIRIVA) 18 MCG inhalation capsule Place 1 capsule (18 mcg total) into inhaler and inhale daily. 08/06/13   Barton Dubois, MD  tiZANidine (ZANAFLEX) 2 MG tablet Take 2 mg by mouth at bedtime.    Historical Provider, MD  traMADol (ULTRAM) 50 MG tablet Take 25 mg by mouth  every 8 (eight) hours. Hold if lethargic or confused    Historical Provider, MD    Family History Family History  Problem Relation Age of Onset  . Cancer - Lung Mother   . Coronary artery disease Mother   . Coronary artery disease Sister   . Coronary artery disease Brother     Social History Social History  Substance Use Topics  . Smoking status: Former Smoker    Packs/day: 1.00    Years: 56.00    Types: Cigarettes    Quit date: 02/12/2014  . Smokeless tobacco: Never Used  . Alcohol use No     Allergies   Eggs or egg-derived products   Review of Systems Review of Systems  Constitutional: Negative for chills and fever.  HENT: Negative for congestion and rhinorrhea.   Eyes: Negative for redness and visual disturbance.  Respiratory: Positive for wheezing. Negative for shortness of breath.   Cardiovascular: Positive for chest pain. Negative for palpitations.  Gastrointestinal: Negative for nausea and vomiting.  Genitourinary: Negative for dysuria and urgency.  Musculoskeletal: Negative for arthralgias and myalgias.  Skin: Negative for pallor and wound.  Neurological: Negative for dizziness and headaches.     Physical Exam Updated Vital Signs BP 122/57   Pulse 88   Temp 97.5 F (36.4 C) (Oral)   Resp 23   Ht 5\' 1"  (1.549 m)   Wt 128 lb (58.1 kg)   SpO2 98%   BMI 24.19 kg/m   Physical Exam  Constitutional: She is oriented to person, place, and time. She appears well-developed and well-nourished. No distress.  HENT:  Head: Normocephalic and atraumatic.  Eyes: EOM are normal. Pupils are equal, round, and reactive to light.  Neck: Normal range of motion. Neck supple.  Cardiovascular: Normal rate and regular rhythm.  Exam reveals no gallop and no friction rub.   No murmur heard. Pulmonary/Chest: Effort normal. She has wheezes (diffuse with prolonged expiration). She has no rales.  Abdominal: Soft. She exhibits no distension. There is no tenderness.    Musculoskeletal: She exhibits no edema or tenderness.  Neurological: She is alert and oriented to person, place, and time.  Skin: Skin is warm and dry. She is not  diaphoretic.  Psychiatric: She has a normal mood and affect. Her behavior is normal.  Nursing note and vitals reviewed.    ED Treatments / Results  Labs (all labs ordered are listed, but only abnormal results are displayed) Labs Reviewed  BASIC METABOLIC PANEL - Abnormal; Notable for the following:       Result Value   Chloride 96 (*)    CO2 37 (*)    Glucose, Bld 112 (*)    All other components within normal limits  CBC - Abnormal; Notable for the following:    WBC 10.7 (*)    Hemoglobin 11.5 (*)    MCHC 28.2 (*)    RDW 18.3 (*)    Platelets 120 (*)    All other components within normal limits  I-STAT VENOUS BLOOD GAS, ED - Abnormal; Notable for the following:    pH, Ven 7.468 (*)    pCO2, Ven 62.0 (*)    pO2, Ven 117.0 (*)    Bicarbonate 44.9 (*)    Acid-Base Excess 18.0 (*)    All other components within normal limits  I-STAT TROPOININ, ED    EKG  EKG Interpretation  Date/Time:  Friday April 23 2016 20:22:16 EST Ventricular Rate:  92 PR Interval:    QRS Duration: 84 QT Interval:  306 QTC Calculation: 379 R Axis:   61 Text Interpretation:  Atrial fibrillation Nonspecific repol abnormality, diffuse leads No significant change since last tracing Confirmed by Bianco Cange MD, DANIEL [phone]) on 04/23/2016 8:42:21 PM       Radiology Dg Chest 2 View  Result Date: 04/23/2016 CLINICAL DATA:  Chest pain EXAM: CHEST  2 VIEW COMPARISON:  03/23/2016 CXR FINDINGS: Stable cardiomegaly with aortic atherosclerosis. Interstitial pulmonary edema persists with bilateral small pleural effusions right slightly greater than left. Superimposed pneumonia at the right lung base is not entirely excluded. No acute osseous abnormality abdominal. IMPRESSION: Cardiomegaly with aortic atherosclerosis. Interstitial pulmonary edema with  bilateral right greater left pleural effusions. Superimposed pneumonia at the right lung base with be difficult to entirely exclude. Electronically Signed   By: Ashley Royalty M.D.   On: 04/23/2016 21:18    Procedures Procedures (including critical care time)  Medications Ordered in ED Medications  ipratropium-albuterol (DUONEB) 0.5-2.5 (3) MG/3ML nebulizer solution 3 mL (3 mLs Nebulization Given 04/23/16 2301)  predniSONE (DELTASONE) tablet 60 mg (60 mg Oral Given 04/23/16 2125)     Initial Impression / Assessment and Plan / ED Course  I have reviewed the triage vital signs and the nursing notes.  Pertinent labs & imaging results that were available during my care of the patient were reviewed by me and considered in my medical decision making (see chart for details).     76 yo F with chest pain, sob.  Feels like her prior copd with cough congestion, change in sputum and increased sputum.   CXR with possible pna.  Metabolic alkalosis, but only mildly worse than prior. I discussed risks and benefits of admission with the family. They're currently feeling Axis I to keep the patient comfortable. We'll send her home with antibiotics for pneumonia. Give breathing treatments every 4 hours while awake.  11:14 PM:  I have discussed the diagnosis/risks/treatment options with the patient and family and believe the pt to be eligible for discharge home to follow-up with PCP. We also discussed returning to the ED immediately if new or worsening sx occur. We discussed the sx which are most concerning (e.g., sudden worsening sob, need to use  inhaler more often than every 4 hours) that necessitate immediate return. Medications administered to the patient during their visit and any new prescriptions provided to the patient are listed below.  Medications given during this visit Medications  ipratropium-albuterol (DUONEB) 0.5-2.5 (3) MG/3ML nebulizer solution 3 mL (3 mLs Nebulization Given 04/23/16 2301)    predniSONE (DELTASONE) tablet 60 mg (60 mg Oral Given 04/23/16 2125)     The patient appears reasonably screen and/or stabilized for discharge and I doubt any other medical condition or other [facility] requiring further screening, evaluation, or treatment in the ED at this time prior to discharge.    Final Clinical Impressions(s) / ED Diagnoses   Final diagnoses:  COPD exacerbation ([place])  Metabolic alkalosis  Community acquired pneumonia, unspecified laterality    New Prescriptions New Prescriptions   LEVOFLOXACIN (LEVAQUIN) 750 MG TABLET    Take 1 tablet (750 mg total) by mouth daily. X 7 days   PREDNISONE (DELTASONE) 20 MG TABLET    2 tabs po daily x 4 days     Deno Etienne, DO 04/23/16 2314

## 2016-04-23 NOTE — Discharge Instructions (Signed)
Here CO2 is high who is at a level without expect for chronic COPD. Your chest x-ray said that you may have pneumonia. Use breathing treatment every 4 hours while awake. Return if you need to use more often than that or for worsening shortness of breath.

## 2016-04-24 DIAGNOSIS — R531 Weakness: Secondary | ICD-10-CM | POA: Diagnosis not present

## 2016-04-24 DIAGNOSIS — R0689 Other abnormalities of breathing: Secondary | ICD-10-CM | POA: Diagnosis not present

## 2016-05-06 DIAGNOSIS — M545 Low back pain: Secondary | ICD-10-CM | POA: Diagnosis not present

## 2016-05-10 IMAGING — CR DG CHEST 2V
2 series · 2 of 2 positions shown · non-contrast
Comparison: Chest x-ray 09/26/2013, 01/31/2012. CT chest
01/31/2012.

CLINICAL DATA: Pneumonia.  Hypertension.

EXAM:
CHEST  2 VIEW

[w chest pa]
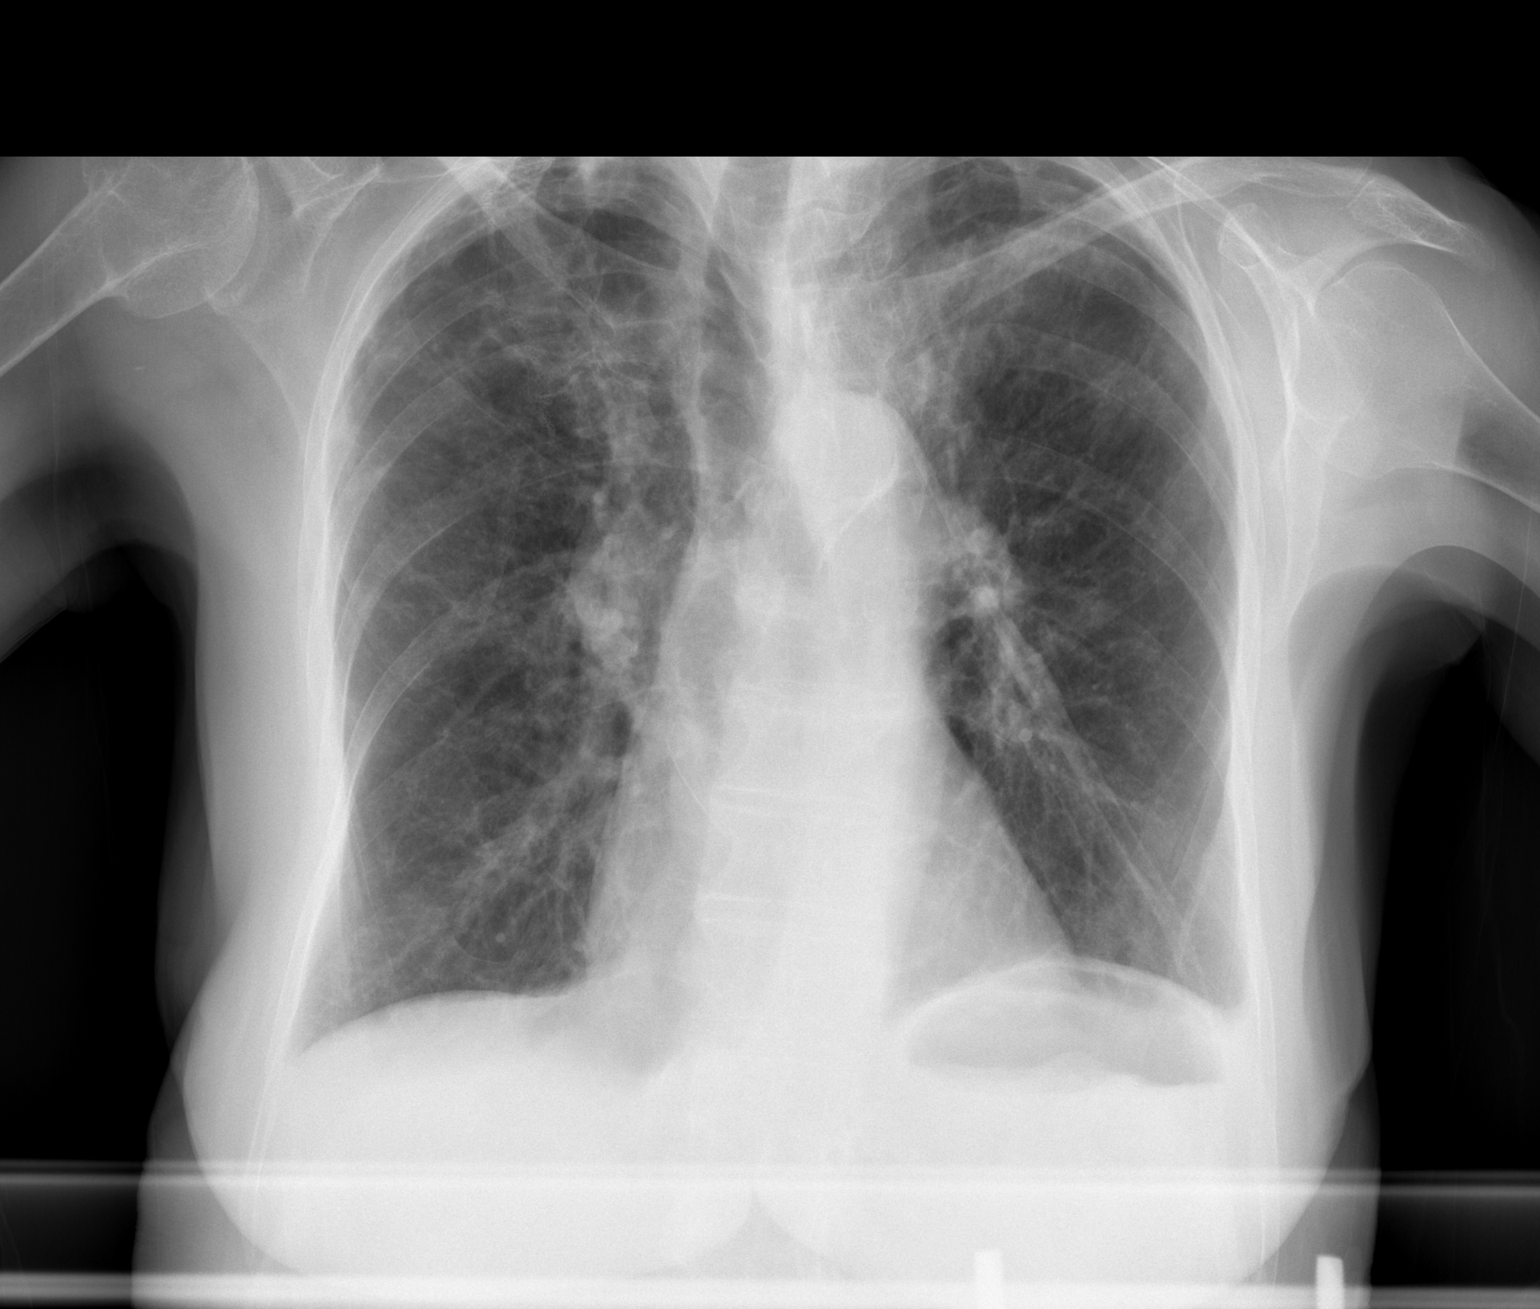

[w chest lat]
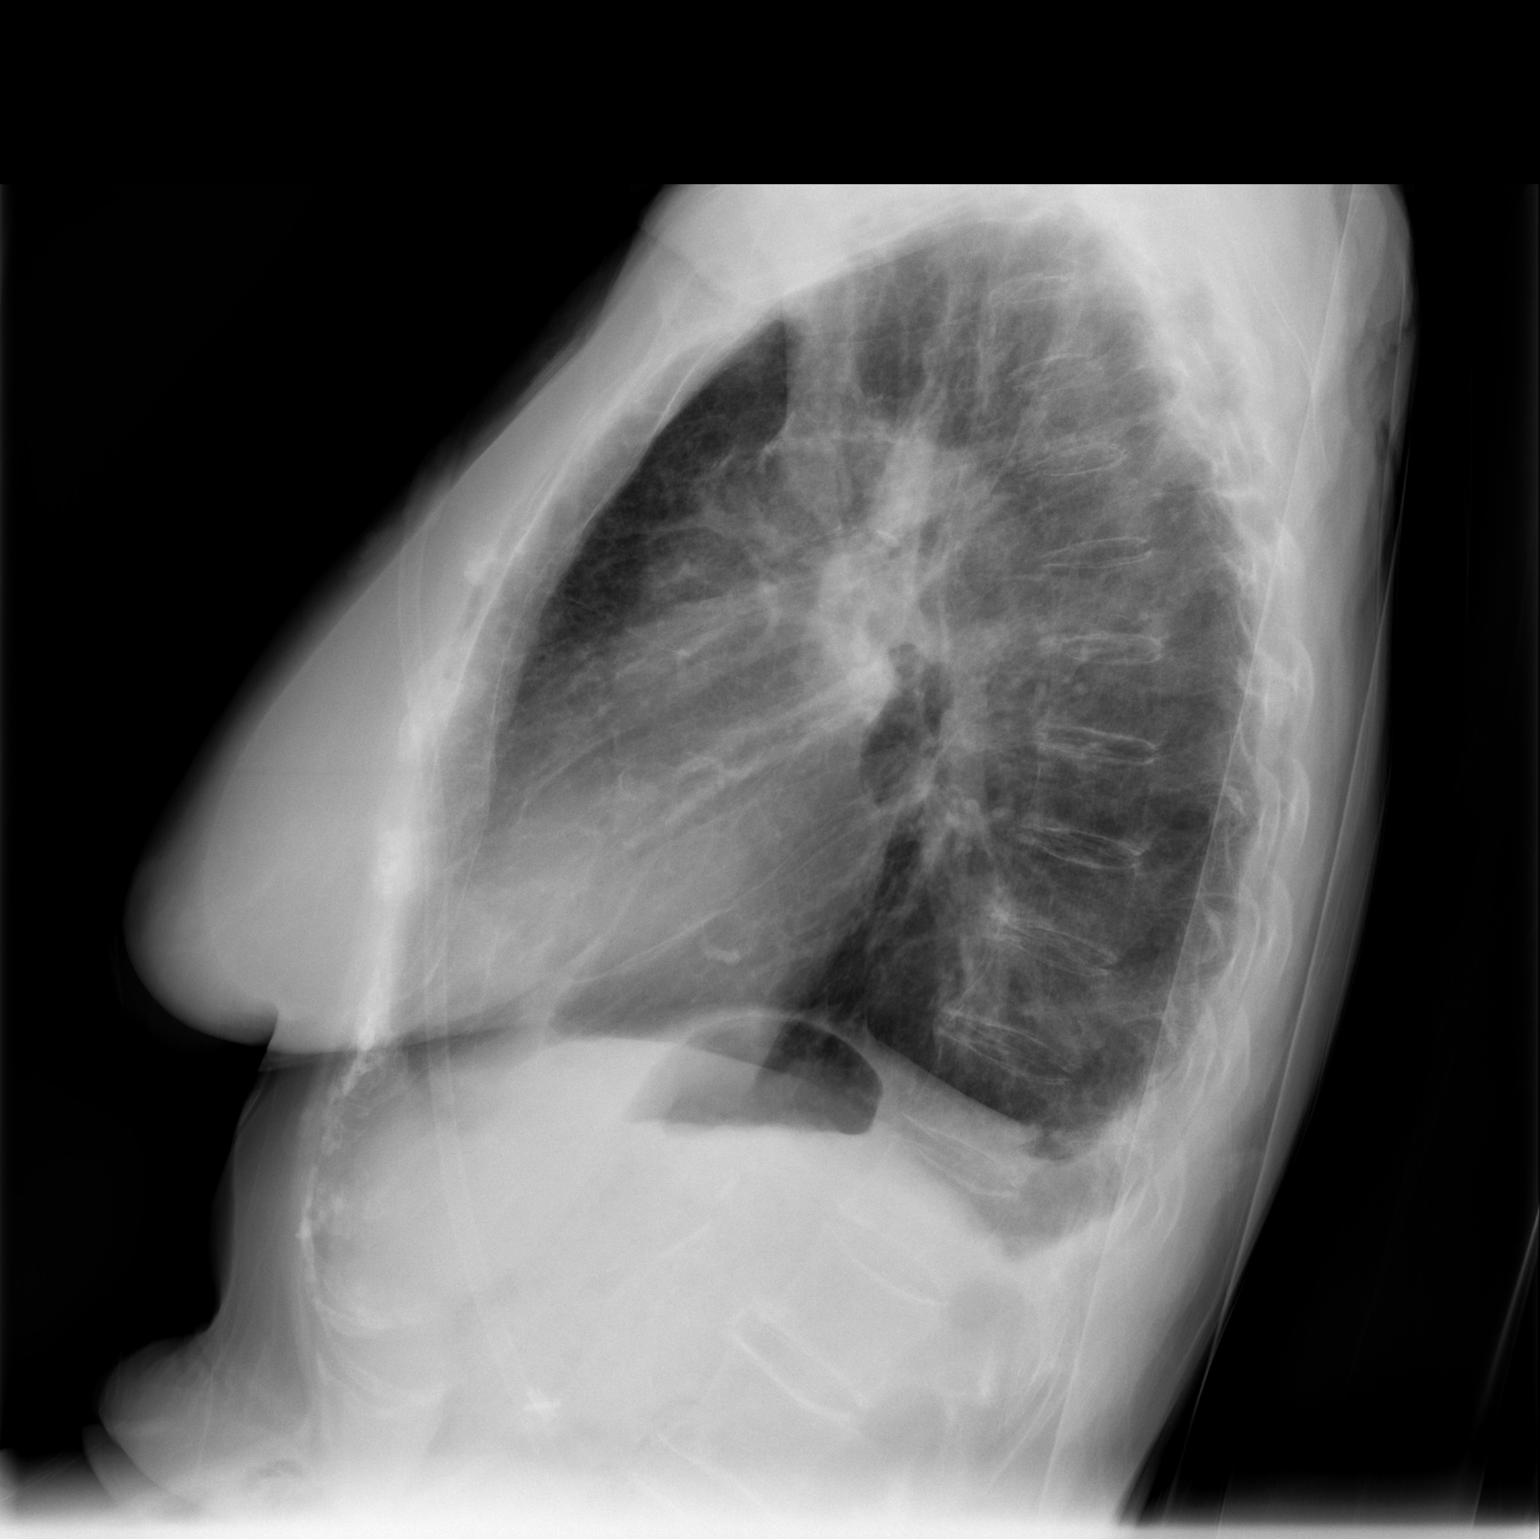

[2 of 2 positions shown; findings below may reference images not displayed]

FINDINGS: Mediastinum and hilar structures are normal. Heart size is stable.
Pulmonary vascularity normal. Stable biapical pleural parenchymal
thickening noted consistent with scarring. Stable left base pleural
thickening consistent with pleural scarring. No acute infiltrate. No
acute bony abnormality. Old healed right proximal humeral fracture.
IMPRESSION: Bilateral pleural parenchymal scarring.  No acute abnormality.

## 2016-05-11 ENCOUNTER — Non-Acute Institutional Stay (SKILLED_NURSING_FACILITY): Payer: Medicare Other | Admitting: Family

## 2016-05-11 ENCOUNTER — Encounter: Payer: Self-pay | Admitting: Family

## 2016-05-11 DIAGNOSIS — R600 Localized edema: Secondary | ICD-10-CM

## 2016-05-11 DIAGNOSIS — N6489 Other specified disorders of breast: Secondary | ICD-10-CM | POA: Diagnosis not present

## 2016-05-11 DIAGNOSIS — R601 Generalized edema: Secondary | ICD-10-CM | POA: Diagnosis not present

## 2016-05-11 MED ORDER — FUROSEMIDE 40 MG PO TABS: 40.0000 mg | ORAL_TABLET | Freq: Two times a day (BID) | ORAL | Status: DC

## 2016-05-11 MED ORDER — POTASSIUM CHLORIDE CRYS ER 20 MEQ PO TBCR: 40.0000 meq | EXTENDED_RELEASE_TABLET | Freq: Every day | ORAL | Status: AC

## 2016-05-11 NOTE — Progress Notes (Signed)
Location:  [facility] Room Number: 1003-B Place of Service:  SNF (31) Provider: Isola Mehlman FNP-C   Blanchie Serve, MD  Patient Care Team: Blanchie Serve, MD as PCP - General (Internal Medicine)  Extended Emergency Contact Information Primary Emergency Contact: [name] Address: [address]          [place], [place] [address] [name] Mobile Phone: [phone] Relation: Son Secondary Emergency Contact: [name] [place] Mobile Phone: [phone] Relation: Relative  Code Status:  DNR  Goals of care: Advanced Directive information Advanced Directives 04/23/2016  Does Patient Have a Medical Advance Directive? Yes  Type of Advance Directive Out of facility DNR (pink MOST or yellow form)  Does patient want to make changes to medical advance directive? No - Patient declined  Copy of [place] in Chart? -  Would patient like information on creating a medical advance directive? -  Pre-existing out of facility DNR order (yellow form or pink MOST form) -     Chief Complaint  Patient presents with  . Acute Visit    Vaginal discharge    HPI:  Pt is a 76 y.o. female seen today at [facility] for an acute visit for evaluation of edema.She has a significant medical history of HTN, CAD, Afib CHF, COPD among other conditions.She is seen in her room today per Facility Nurse request. Nurse reports patient was noted by CNA during care to have vaginal discharge suspect possible yeast infection.Nurse supervisor also reports patient has swelling on right breast.she states.She denies any pain, shortness of breath, wheezing fever or chills.    Past Medical History:  Diagnosis Date  . Anemia   . Arthritis    "back" (11/21/2014)  . Asthma   . Blood dyscrasia    hx blood clotts  . Chronic bronchitis ([place])   . Chronic kidney disease   . Chronic lower back pain   . 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 GERD (gastroesophageal reflux disease)   . [id number])    "a few times/year" (11/21/2014)  . History of blood transfusion X 2   "related to blood thinner I was taking"  . Hyperlipidemia   . Hypertension   . Migraine    "weekly probably" (11/21/2014)  . On home oxygen therapy    "3L; 24/7" (11/21/2014)  . Pneumonia    hx pneumonia   . Pneumothorax on right 09/2014  . Pulmonary embolism ([place])   . Rectal bleed 03/2016  . Shortness of breath   . Urinary incontinence    Past Surgical History:  Procedure Laterality Date  . ABDOMINAL HYSTERECTOMY    . CATARACT EXTRACTION W/ INTRAOCULAR LENS  IMPLANT, BILATERAL Bilateral   . CYSTOSCOPY W/ RETROGRADES Bilateral 06/11/2014   Procedure: CYSTOSCOPY WITH RETROGRADE PYELOGRAM;  Surgeon: [name], MD;  [facility];  Service: Urology;  Laterality: Bilateral;  . FRACTURE SURGERY    . HARDWARE REMOVAL  01/29/2012   Procedure: HARDWARE REMOVAL;  Surgeon: [name], MD;  Location: [facility];  Service: Orthopedics;  Laterality: Right;  . HEMIARTHROPLASTY SHOULDER FRACTURE Right   . HIP ARTHROPLASTY  01/29/2012   Procedure: ARTHROPLASTY BIPOLAR HIP;  Surgeon: [name], MD;  Location: [facility];  Service: Orthopedics;  Laterality: Right;  . LAPAROSCOPIC CHOLECYSTECTOMY    . ORIF WRIST FRACTURE  10/13/2011   Procedure: OPEN REDUCTION INTERNAL FIXATION (ORIF) WRIST FRACTURE;  Surgeon: [name], MD;  Location: [facility];  Service: Orthopedics;  Laterality: Right;  Open Reduction Internal Fixation Right Distal Radius   . TRANSURETHRAL RESECTION OF BLADDER TUMOR N/A 06/11/2014   Procedure: TRANSURETHRAL RESECTION OF BLADDER TUMOR (TURBT);  Surgeon: [name], MD;  [facility];  Service: Urology;  Laterality: N/A;  . TUBAL LIGATION      Allergies  Allergen Reactions  . Eggs Or Egg-Derived Products Other (See Comments)    Listed on MAR    Allergies as of 05/11/2016      Reactions    Eggs Or Egg-derived Products Other (See Comments)   Listed on [facility]      Medication List       Accurate as of 05/11/16  3:09 PM. Always use your most recent med list.          acetaminophen 650 MG CR tablet Commonly known as:  TYLENOL Take 650 mg by mouth every 8 (eight) hours.   albuterol 0.63 MG/3ML nebulizer solution Commonly known as:  ACCUNEB Take 1 ampule by nebulization every 4 (four) hours as needed for wheezing.   atorvastatin 40 MG tablet Commonly known as:  LIPITOR Take 40 mg by mouth daily at 6 PM.   calcium-vitamin D 500-200 MG-UNIT tablet Commonly known as:  OSCAL WITH D Take 1 tablet by mouth 3 (three) times daily.   dabigatran 150 MG Caps capsule Commonly known as:  PRADAXA Take 150 mg by mouth 2 (two) times daily.   digoxin 0.125 MG tablet Commonly known as:  LANOXIN Take 1 tablet (0.125 mg total) by mouth daily.   docusate sodium 100 MG capsule Commonly known as:  COLACE Take 1 capsule (100 mg total) by mouth every 12 (twelve) hours.   ferrous sulfate 325 (65 FE) MG tablet Take 325 mg by mouth daily with breakfast.   fluticasone 50 MCG/ACT nasal spray Commonly known as:  FLONASE Place 1 spray into both nostrils daily as needed for allergies or rhinitis.   furosemide 40 MG tablet Commonly known as:  LASIX Take 1 tablet (40 mg total) by mouth 2 (two) times daily. Then start furosemide 40 mg Tablet daily   guaifenesin 400 MG Tabs tablet Commonly known as:  HUMIBID E Take 400 mg by mouth 2 (two) times daily.   ipratropium-albuterol 0.5-2.5 (3) MG/3ML Soln Commonly known as:  DUONEB Take 3 mLs by nebulization 2 (two) times daily.   Lidocaine 4 % Ptch Apply 2 patches topically every 12 (twelve) hours. Apply to lower back   metoprolol 50 MG tablet Commonly known as:  LOPRESSOR Take 1 tablet (50 mg total) by mouth 2 (two) times daily.   multivitamin with minerals tablet Take 1 tablet by mouth daily.   OVER THE COUNTER MEDICATION Take 120  mLs by mouth 2 (two) times daily. Med pass   OXYGEN Inhale 3 L into the lungs daily as needed (for SOB).   pantoprazole 40 MG tablet Commonly known as:  PROTONIX Take 1 tablet (40 mg total) by mouth daily.   potassium chloride SA 20 MEQ tablet Commonly known as:  K-DUR,KLOR-CON Take 2 tablets (40 mEq total) by mouth daily.   sertraline 25 MG tablet Commonly known as:  ZOLOFT Take 1 tablet (25 mg total) by mouth at bedtime.   tiZANidine 2 MG tablet Commonly known as:  ZANAFLEX Take 2 mg by mouth at bedtime.       Review of Systems  Constitutional: Negative for appetite change, chills and fever.  HENT: Negative for congestion, rhinorrhea, sinus pressure, sneezing and sore throat.   Eyes: Negative.   Respiratory: Negative for cough, chest tightness and wheezing.        Oxygen via [place]   Cardiovascular: Negative for chest pain, palpitations and leg swelling.  Gastrointestinal: Negative for abdominal distention, abdominal pain, constipation and diarrhea.  Genitourinary: Negative for dysuria, frequency and urgency.  Musculoskeletal: Positive for gait problem.  Skin: Negative for pallor and rash.  Neurological: Negative for dizziness, seizures, syncope, light-headedness and headaches.  Psychiatric/Behavioral: Negative for agitation, confusion, hallucinations and sleep disturbance. The patient is not nervous/anxious.     Immunization History  Administered Date(s) Administered  . Influenza-Unspecified 11/13/2013, 12/15/2015  . PPD Test 12/16/2015   Pertinent  Health Maintenance Due  Topic Date Due  . COLONOSCOPY  06/09/1991  . PNA vac Low Risk Adult (1 of 2 - PCV13) 06/09/2006  . MAMMOGRAM  11/19/2017  . INFLUENZA VACCINE  Completed  . DEXA SCAN  Completed   Fall Risk  12/31/2015 05/22/2015 02/20/2015 11/19/2014 10/07/2014  Falls in the past year? No No No No No  Risk for fall due to : Impaired mobility History of fall(s) Impaired balance/gait;History of fall(s) Impaired  balance/gait;History of fall(s);Impaired mobility History of fall(s);Impaired balance/gait  Risk for fall due to (comments): - - - - -      Vitals:   05/11/16 1005  BP: 104/60  Pulse: 93  Resp: 20  Temp: 97.6 F (36.4 C)  TempSrc: Oral  SpO2: 93%  Weight: 123 lb 12.8 oz (56.2 kg)  Height: 5\' 1"  (1.549 m)   Body mass index is 23.39 kg/m. Physical Exam  Constitutional: She is oriented to person, place, and time. She appears well-developed and well-nourished. No distress.  HENT:  Head: Normocephalic.  Mouth/Throat: Oropharynx is clear and moist. No oropharyngeal exudate.  Eyes: Conjunctivae and EOM are normal. Pupils are equal, round, and reactive to light. Right eye exhibits no discharge. Left eye exhibits no discharge. No scleral icterus.  Neck: Normal range of motion. No JVD present. No thyromegaly present.  Cardiovascular: Normal rate, regular rhythm, normal heart sounds and intact distal pulses.  Exam reveals no gallop and no friction rub.   No murmur heard. Pulmonary/Chest: Effort normal. No respiratory distress. She has no wheezes. She has no rales.  Oxygen 3 Liters via [place]   Abdominal: Soft. Bowel sounds are normal. She exhibits no distension. There is no tenderness. There is no rebound and no guarding.  Genitourinary:  Genitourinary Comments: Incontinent   Musculoskeletal: She exhibits edema. She exhibits no tenderness or deformity.  Moves X 4 extremities.Generalized edema 2-3 + worst on lower extremities.   Lymphadenopathy:    She has no cervical adenopathy.  Neurological: She is oriented to person, place, and time.  Skin: Skin is warm and dry. No erythema. No pallor.  Right breast edema large than left breast.Right breast non-tender to touch, no mass to palpation and without any signs of infections.  Psychiatric: She has a normal mood and affect.    Labs reviewed:  Recent Labs  01/17/16 0922  03/21/16 1705 03/22/16 0347 03/24/16 0545 04/23/16 04/23/16 2051    NA  --   < >  --  141 143 150* 144  K  --   < >  --  3.7 4.5  --  4.0  CL  --   < >  --  100* 102  --  96*  CO2  --   < >  --  33* 34*  --  37*  GLUCOSE  --   < >  --  94 92  --  112*  BUN  --   < >  --  11 11 15 15   CREATININE  --   < >  --  0.56 0.62 0.5 0.53  CALCIUM  --   < >  --  8.7* 8.4*  --  8.9  MG 1.8  --  1.8  --   --   --   --   PHOS  --   --  3.9  --   --   --   --   < > = values in this interval not displayed.  Recent Labs  01/22/16 0329 03/21/16 1218 03/22/16 0347  AST 20 28 22   ALT 27 18 17   ALKPHOS 49 62 55  BILITOT 0.6 1.0 0.5  PROT 4.9* 6.1* 5.5*  ALBUMIN 2.5* 2.9* 2.7*    Recent Labs  12/10/15 1526  01/17/16 0555  03/21/16 1218  03/24/16 0545 03/26/16 0630 04/23/16 2051  WBC 9.6  < > 8.7  < > 11.2*  < > 11.7* 14.2* 10.7*  NEUTROABS 7.9*  --  6.9  --  8.8*  --   --   --   --   HGB 11.8*  < > 13.8  < > 12.9  < > 12.4 12.7 11.5*  HCT 40.8  < > 47.9*  < > 44.6  < > 43.0 44.5 40.8  MCV 90.9  < > 93.7  < > 91.2  < > 92.3 93.1 92.7  PLT 142*  < > 140*  < > 145*  < > 128* 113* 120*  < > = values in this interval not displayed. Lab Results  Component Value Date   TSH 1.249 01/17/2016   No results found for: HGBA1C Lab Results  Component Value Date   CHOL 102 08/05/2013   HDL 47 08/05/2013   LDLCALC 42 08/05/2013   TRIG 66 08/05/2013   CHOLHDL 2.2 08/05/2013   Assessment/Plan Generalized Edema  Generalized edema 2-3 + worst on lower extremities and abdomen. will increase Furosemide 40 mg tablet to  twice daily. Increase potassium chloride to 40 meq Tablet daily. No further labs desired by patient's POA per hospice. Continue on Hospice service.    Right arm edema  Non tender to touch and without any redness or warm to touch. Furosemide per above orders.Therapy to order right arm and fingers compression stockings on in the morning and off at bedtime.    Edema of right breast   Right breast > left breast. non-tender to touch, no mass to palpation  and without any signs of infections.  Family/ staff Communication: Reviewed plan of care with patient and facility Nurse supervisor.  Labs/tests ordered: No further labs desired by patient's POA.

## 2016-06-02 ENCOUNTER — Non-Acute Institutional Stay (SKILLED_NURSING_FACILITY): Payer: Medicare Other | Admitting: Family

## 2016-06-02 ENCOUNTER — Encounter: Payer: Self-pay | Admitting: Family

## 2016-06-02 DIAGNOSIS — M545 Low back pain: Secondary | ICD-10-CM | POA: Diagnosis not present

## 2016-06-02 DIAGNOSIS — J449 Chronic obstructive pulmonary disease, unspecified: Secondary | ICD-10-CM

## 2016-06-02 DIAGNOSIS — I482 Chronic atrial fibrillation, unspecified: Secondary | ICD-10-CM

## 2016-06-02 DIAGNOSIS — I5032 Chronic diastolic (congestive) heart failure: Secondary | ICD-10-CM

## 2016-06-02 MED ORDER — FUROSEMIDE 40 MG PO TABS: 60.0000 mg | ORAL_TABLET | Freq: Two times a day (BID) | ORAL | Status: AC

## 2016-06-02 NOTE — Progress Notes (Signed)
Location:  [facility] Room Number: [place] of Service:  SNF (31) Provider: Wen Merced FNP-C   Blanchie Serve, MD  Patient Care Team: Blanchie Serve, MD as PCP - General (Internal Medicine)  Extended Emergency Contact Information Primary Emergency Contact: [name] Address: [address]          [place], [place] [address] [name] Mobile Phone: [phone] Relation: Son Secondary Emergency Contact: [name] [place] Mobile Phone: [phone] Relation: Relative  Code Status:  DNR  Goals of care: Advanced Directive information Advanced Directives 04/23/2016  Does Patient Have a Medical Advance Directive? Yes  Type of Advance Directive Out of facility DNR (pink MOST or yellow form)  Does patient want to make changes to medical advance directive? No - Patient declined  Copy of [place] in Chart? -  Would patient like information on creating a medical advance directive? -  Pre-existing out of facility DNR order (yellow form or pink MOST form) -     Chief Complaint  Patient presents with  . Medical Management of Chronic Issues    Routine Visit   . Health Maintenance    Colonoscopy overdue but currently on hospice service.     HPI:  Pt is a 76 y.o. female seen today at [facility] for an acute visit for evaluation of edema.She has a significant medical history of HTN, CAD, Afib CHF, COPD among other conditions.She is seen in her room today.She complains of increased shortness of breath despite use of Neb treatment. She  denies any Fever,chills or wheezing. No recent acute illness or fall episodes reported.   Past Medical History:  Diagnosis Date  . Anemia   . Arthritis    "back" (11/21/2014)  . Asthma   . Blood dyscrasia    hx blood clotts  . Chronic bronchitis ([place])   . Chronic kidney disease   . Chronic lower back pain   . COPD (chronic obstructive  pulmonary disease) (HCC)    emphysema    . GERD (gastroesophageal reflux disease)   . QPYPPJKD(326.7)    "a few times/year" (11/21/2014)  . History of blood transfusion X 2   "related to blood thinner I was taking"  . Hyperlipidemia   . Hypertension   . Migraine    "weekly probably" (11/21/2014)  . On home oxygen therapy    "3L; 24/7" (11/21/2014)  . Pneumonia    hx pneumonia   . Pneumothorax on right 09/2014  . Pulmonary embolism ([place])   . Rectal bleed 03/2016  . Shortness of breath   . Urinary incontinence    Past Surgical History:  Procedure Laterality Date  . ABDOMINAL HYSTERECTOMY    . CATARACT EXTRACTION W/ INTRAOCULAR LENS  IMPLANT, BILATERAL Bilateral   . CYSTOSCOPY W/ RETROGRADES Bilateral 06/11/2014   Procedure: CYSTOSCOPY WITH RETROGRADE PYELOGRAM;  Surgeon: [name], MD;  [facility];  Service: Urology;  Laterality: Bilateral;  . FRACTURE SURGERY    . HARDWARE REMOVAL  01/29/2012   Procedure: HARDWARE REMOVAL;  Surgeon: [name], MD;  Location: [facility];  Service: Orthopedics;  Laterality: Right;  . HEMIARTHROPLASTY SHOULDER FRACTURE Right   . HIP ARTHROPLASTY  01/29/2012   Procedure: ARTHROPLASTY BIPOLAR HIP;  Surgeon: [name], MD;  Location: [facility];  Service: Orthopedics;  Laterality: Right;  . LAPAROSCOPIC CHOLECYSTECTOMY    . ORIF WRIST FRACTURE  10/13/2011   Procedure: OPEN REDUCTION  INTERNAL FIXATION (ORIF) WRIST FRACTURE;  Surgeon: [name], MD;  Location: [facility];  Service: Orthopedics;  Laterality: Right;  Open Reduction Internal Fixation Right Distal Radius   . TRANSURETHRAL RESECTION OF BLADDER TUMOR N/A 06/11/2014   Procedure: TRANSURETHRAL RESECTION OF BLADDER TUMOR (TURBT);  Surgeon: [name], MD;  [facility];  Service: Urology;  Laterality: N/A;  . TUBAL LIGATION      Allergies  Allergen Reactions  . Eggs Or Egg-Derived Products Other (See Comments)    Listed on MAR    Allergies as of 06/02/2016      Reactions   Eggs Or  Egg-derived Products Other (See Comments)   Listed on [facility]      Medication List       Accurate as of 06/02/16  2:03 PM. Always use your most recent med list.          acetaminophen 650 MG CR tablet Commonly known as:  TYLENOL Take 650 mg by mouth every 8 (eight) hours.   albuterol 0.63 MG/3ML nebulizer solution Commonly known as:  ACCUNEB Take 1 ampule by nebulization every 4 (four) hours as needed for wheezing.   atorvastatin 40 MG tablet Commonly known as:  LIPITOR Take 40 mg by mouth daily at 6 PM.   calcium-vitamin D 500-200 MG-UNIT tablet Commonly known as:  OSCAL WITH D Take 1 tablet by mouth 3 (three) times daily.   dabigatran 150 MG Caps capsule Commonly known as:  PRADAXA Take 150 mg by mouth 2 (two) times daily.   digoxin 0.125 MG tablet Commonly known as:  LANOXIN Take 1 tablet (0.125 mg total) by mouth daily.   docusate sodium 100 MG capsule Commonly known as:  COLACE Take 1 capsule (100 mg total) by mouth every 12 (twelve) hours.   ferrous sulfate 325 (65 FE) MG tablet Take 325 mg by mouth daily with breakfast.   fluticasone 50 MCG/ACT nasal spray Commonly known as:  FLONASE Place 1 spray into both nostrils daily as needed for allergies or rhinitis.   furosemide 40 MG tablet Commonly known as:  LASIX Take 1.5 tablets (60 mg total) by mouth 2 (two) times daily.   guaifenesin 400 MG Tabs tablet Commonly known as:  HUMIBID E Take 400 mg by mouth 2 (two) times daily.   ipratropium-albuterol 0.5-2.5 (3) MG/3ML Soln Commonly known as:  DUONEB Take 3 mLs by nebulization 2 (two) times daily.   Lidocaine 4 % Ptch Apply 2 patches topically every 12 (twelve) hours. Apply to lower back   metoprolol 50 MG tablet Commonly known as:  LOPRESSOR Take 1 tablet (50 mg total) by mouth 2 (two) times daily.   multivitamin with minerals tablet Take 1 tablet by mouth daily.   OVER THE COUNTER MEDICATION Take 120 mLs by mouth 2 (two) times daily. Med pass     OXYGEN Inhale 3 L into the lungs daily as needed (for SOB).   pantoprazole 40 MG tablet Commonly known as:  PROTONIX Take 1 tablet (40 mg total) by mouth daily.   potassium chloride SA 20 MEQ tablet Commonly known as:  K-DUR,KLOR-CON Take 2 tablets (40 mEq total) by mouth daily.   sertraline 25 MG tablet Commonly known as:  ZOLOFT Take 1 tablet (25 mg total) by mouth at bedtime.   tiZANidine 2 MG tablet Commonly known as:  ZANAFLEX Take 2 mg by mouth at bedtime.   vitamin B-12 500 MCG tablet Commonly known as:  CYANOCOBALAMIN Take 500 mcg by mouth daily.  Review of Systems  Constitutional: Negative for appetite change, chills and fever.  HENT: Negative for congestion, rhinorrhea, sinus pressure, sneezing and sore throat.   Eyes: Negative.   Respiratory: Negative for cough, chest tightness and wheezing.        Oxygen via [place]   Cardiovascular: Negative for chest pain, palpitations and leg swelling.  Gastrointestinal: Negative for abdominal distention, abdominal pain, constipation and diarrhea.  Endocrine: Negative for cold intolerance, heat intolerance, polydipsia, polyphagia and polyuria.  Genitourinary: Negative for dysuria, frequency and urgency.  Musculoskeletal: Positive for gait problem.  Skin: Negative for pallor and rash.  Neurological: Negative for dizziness, seizures, syncope, light-headedness and headaches.  Hematological: Does not bruise/bleed easily.  Psychiatric/Behavioral: Negative for agitation, confusion, hallucinations and sleep disturbance. The patient is not nervous/anxious.     Immunization History  Administered Date(s) Administered  . Influenza-Unspecified 11/13/2013, 12/15/2015  . PPD Test 12/16/2015   Pertinent  Health Maintenance Due  Topic Date Due  . COLONOSCOPY  06/09/1991  . PNA vac Low Risk Adult (1 of 2 - PCV13) 03/16/2023 (Originally 06/09/2006)  . MAMMOGRAM  11/19/2017  . INFLUENZA VACCINE  Completed  . DEXA SCAN  Completed    Fall Risk  12/31/2015 05/22/2015 02/20/2015 11/19/2014 10/07/2014  Falls in the past year? No No No No No  Risk for fall due to : Impaired mobility History of fall(s) Impaired balance/gait;History of fall(s) Impaired balance/gait;History of fall(s);Impaired mobility History of fall(s);Impaired balance/gait  Risk for fall due to (comments): - - - - -      Vitals:   06/02/16 1115  BP: 114/64  Pulse: 72  Resp: 20  Temp: 98.9 F (37.2 C)  SpO2: 95%  Weight: 123 lb 12.8 oz (56.2 kg)  Height: 5\' 1"  (1.549 m)   Body mass index is 23.39 kg/m. Physical Exam  Constitutional: She is oriented to person, place, and time. She appears well-developed and well-nourished. No distress.  HENT:  Head: Normocephalic.  Mouth/Throat: Oropharynx is clear and moist. No oropharyngeal exudate.  Eyes: Conjunctivae and EOM are normal. Pupils are equal, round, and reactive to light. Right eye exhibits no discharge. Left eye exhibits no discharge. No scleral icterus.  Neck: Normal range of motion. No JVD present. No thyromegaly present.  Cardiovascular: Normal rate, regular rhythm, normal heart sounds and intact distal pulses.  Exam reveals no gallop and no friction rub.   No murmur heard. Pulmonary/Chest: Effort normal. No respiratory distress. She has no wheezes. She has no rales.  Oxygen 3 Liters via [place]   Abdominal: Soft. Bowel sounds are normal. She exhibits no distension. There is no tenderness. There is no rebound and no guarding.  Genitourinary:  Genitourinary Comments: Incontinent   Musculoskeletal: She exhibits edema. She exhibits no tenderness or deformity.  Moves X 4 extremities.Bilateral lower extremities 3 + worst on lower extremities.   Lymphadenopathy:    She has no cervical adenopathy.  Neurological: She is oriented to person, place, and time.  Skin: Skin is warm and dry. No erythema. No pallor.  Psychiatric: She has a normal mood and affect.    Labs reviewed:  Recent Labs  01/17/16 0922   03/21/16 1705 03/22/16 0347 03/24/16 0545  04/19/16 0222 04/23/16 04/23/16 1535 04/23/16 2051  NA  --   < >  --  141 143  < > 147 150* 150* 144  K  --   < >  --  3.7 4.5  < > 5.4*  --  5.3 4.0  CL  --   < >  --  100* 102  --   --   --   --  96*  CO2  --   < >  --  33* 34*  --   --   --   --  37*  GLUCOSE  --   < >  --  94 92  --   --   --   --  112*  BUN  --   < >  --  11 11  < > 16 15 15 15   CREATININE  --   < >  --  0.56 0.62  < > 0.6 0.5 0.5 0.53  CALCIUM  --   < >  --  8.7* 8.4*  --   --   --   --  8.9  MG 1.8  --  1.8  --   --   --   --   --   --   --   PHOS  --   --  3.9  --   --   --   --   --   --   --   < > = values in this interval not displayed.  Recent Labs  01/22/16 0329 03/21/16 1218 03/22/16 0347  AST 20 28 22   ALT 27 18 17   ALKPHOS 49 62 55  BILITOT 0.6 1.0 0.5  PROT 4.9* 6.1* 5.5*  ALBUMIN 2.5* 2.9* 2.7*    Recent Labs  12/10/15 1526  01/17/16 0555  03/21/16 1218  03/24/16 0545 03/26/16 0630 04/02/16 2201 04/12/16 1408 04/23/16 2051  WBC 9.6  < > 8.7  < > 11.2*  < > 11.7* 14.2* 13.0 14.3 10.7*  NEUTROABS 7.9*  --  6.9  --  8.8*  --   --   --   --   --   --   HGB 11.8*  < > 13.8  < > 12.9  < > 12.4 12.7 13.4 12.8 11.5*  HCT 40.8  < > 47.9*  < > 44.6  < > 43.0 44.5 46 44 40.8  MCV 90.9  < > 93.7  < > 91.2  < > 92.3 93.1  --   --  92.7  PLT 142*  < > 140*  < > 145*  < > 128* 113* 150 73* 120*  < > = values in this interval not displayed. Lab Results  Component Value Date   TSH 1.249 01/17/2016   No results found for: HGBA1C Lab Results  Component Value Date   CHOL 102 08/05/2013   HDL 47 08/05/2013   LDLCALC 42 08/05/2013   TRIG 66 08/05/2013   CHOLHDL 2.2 08/05/2013   Assessment/Plan CHF Wt stable.bilateral lower extremities 3+ edema and cough persist. D/c current furosemide then start Furosemide 40 mg Tablet take 1.5 Tablet ( 60 mg ) by mouth twice daily. No further lab work per [employer] request. Continue on Potassium supplements. Continue  on Hospice service.    COPD Afebrile. Exam findings negative for wheezing. Continue on Duonebs.   Chronic Afib  HR controlled. Continue on metoprolol   Family/ staff Communication: Reviewed plan of care with patient and facility Nurse supervisor.  Labs/tests ordered: No further labs desired by patient's POA.

## 2016-07-13 DEATH — deceased

## 2016-09-07 IMAGING — CR DG CHEST 2V
2 series · 2 of 2 positions shown · non-contrast
Comparison: 12/28/2013

CLINICAL DATA: Per ED note: Pt arrives via [REDACTED] from home. Pt has
c/o SOB that was unrelieved by 2 albuterol tx this morning, and
abdominal pain accompanied by diarrhea x 2 days. EMS reports
diminished lung sounds on the right side and O2 sats were 86% on RA
upon arrival.

EXAM:
CHEST  2 VIEW

[chest lat]
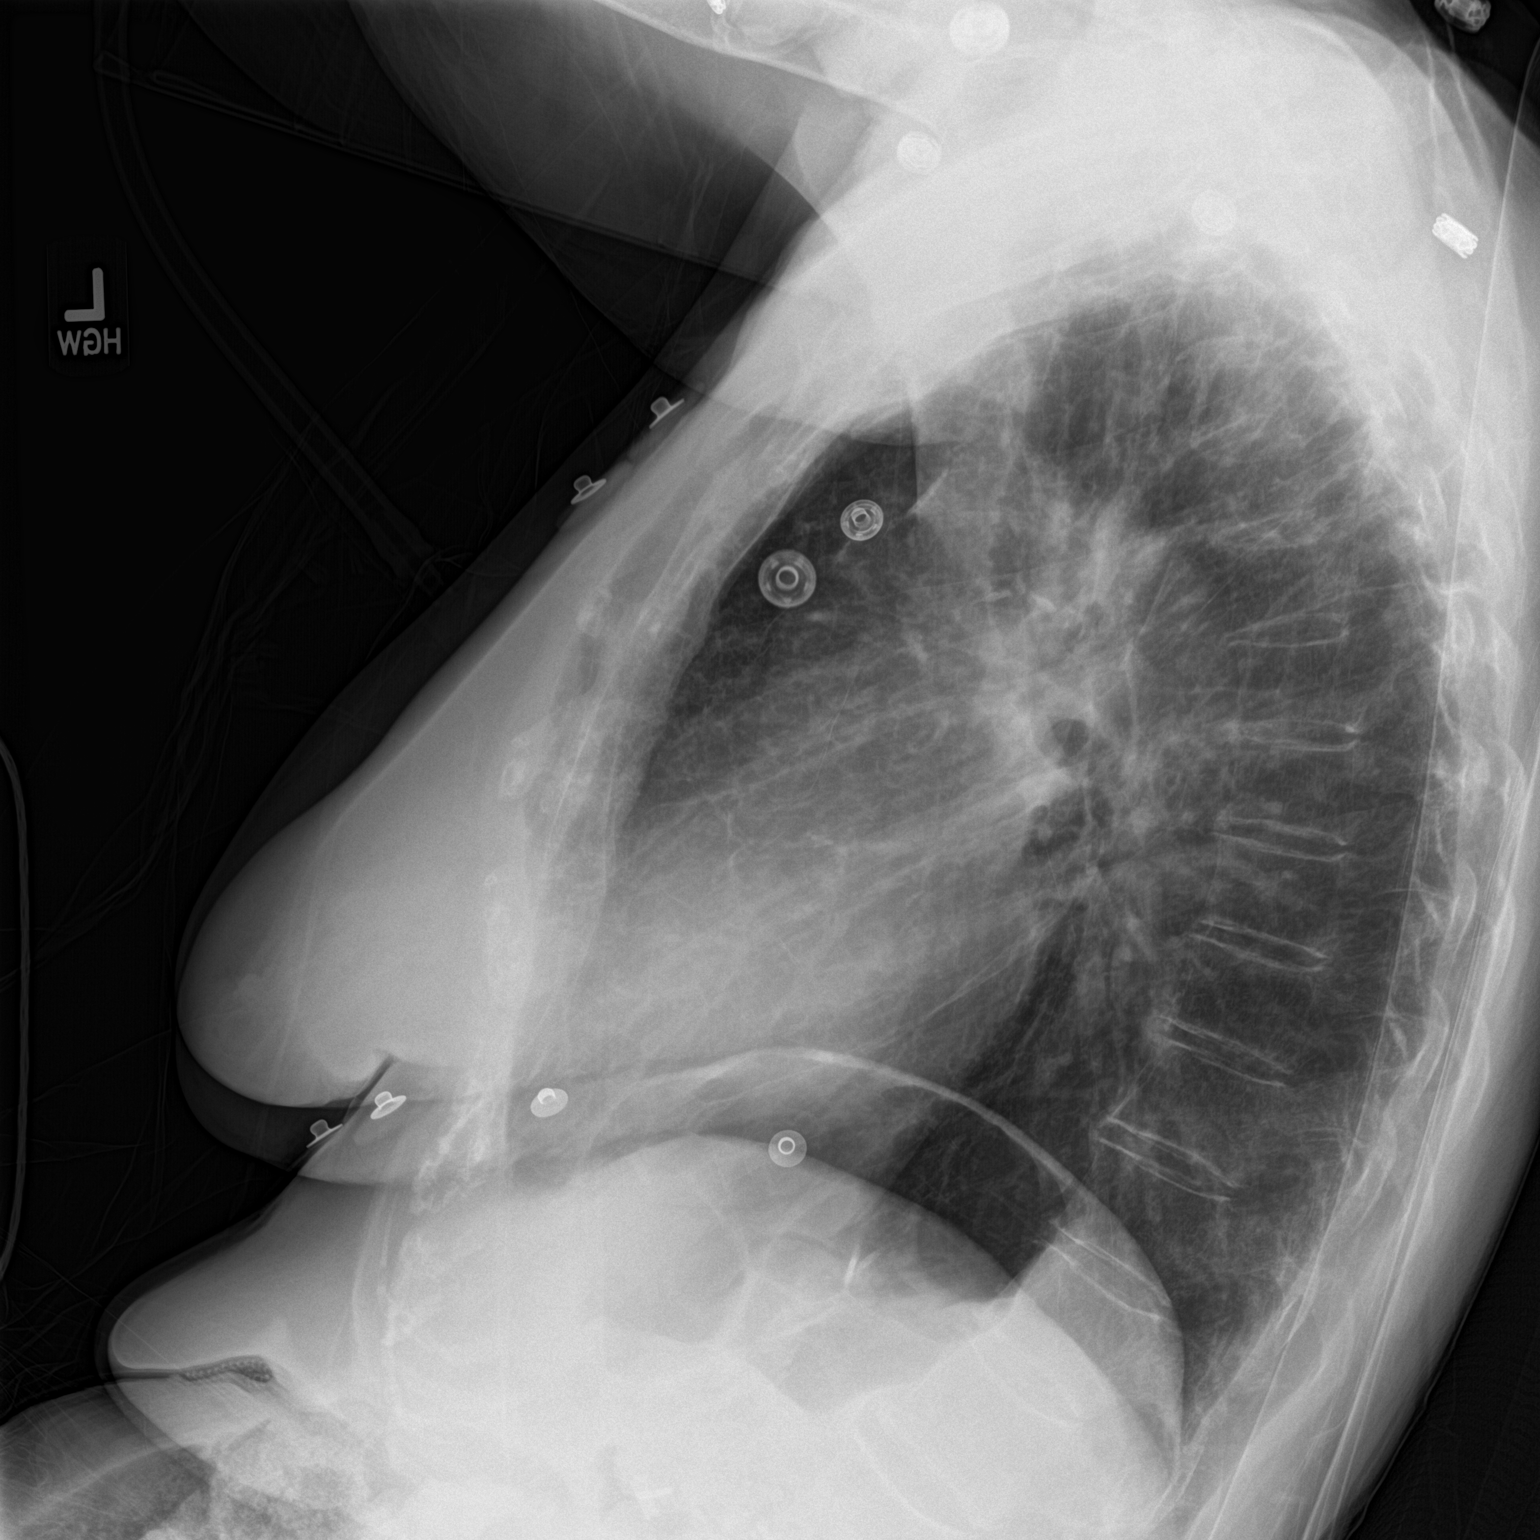

[chest ap]
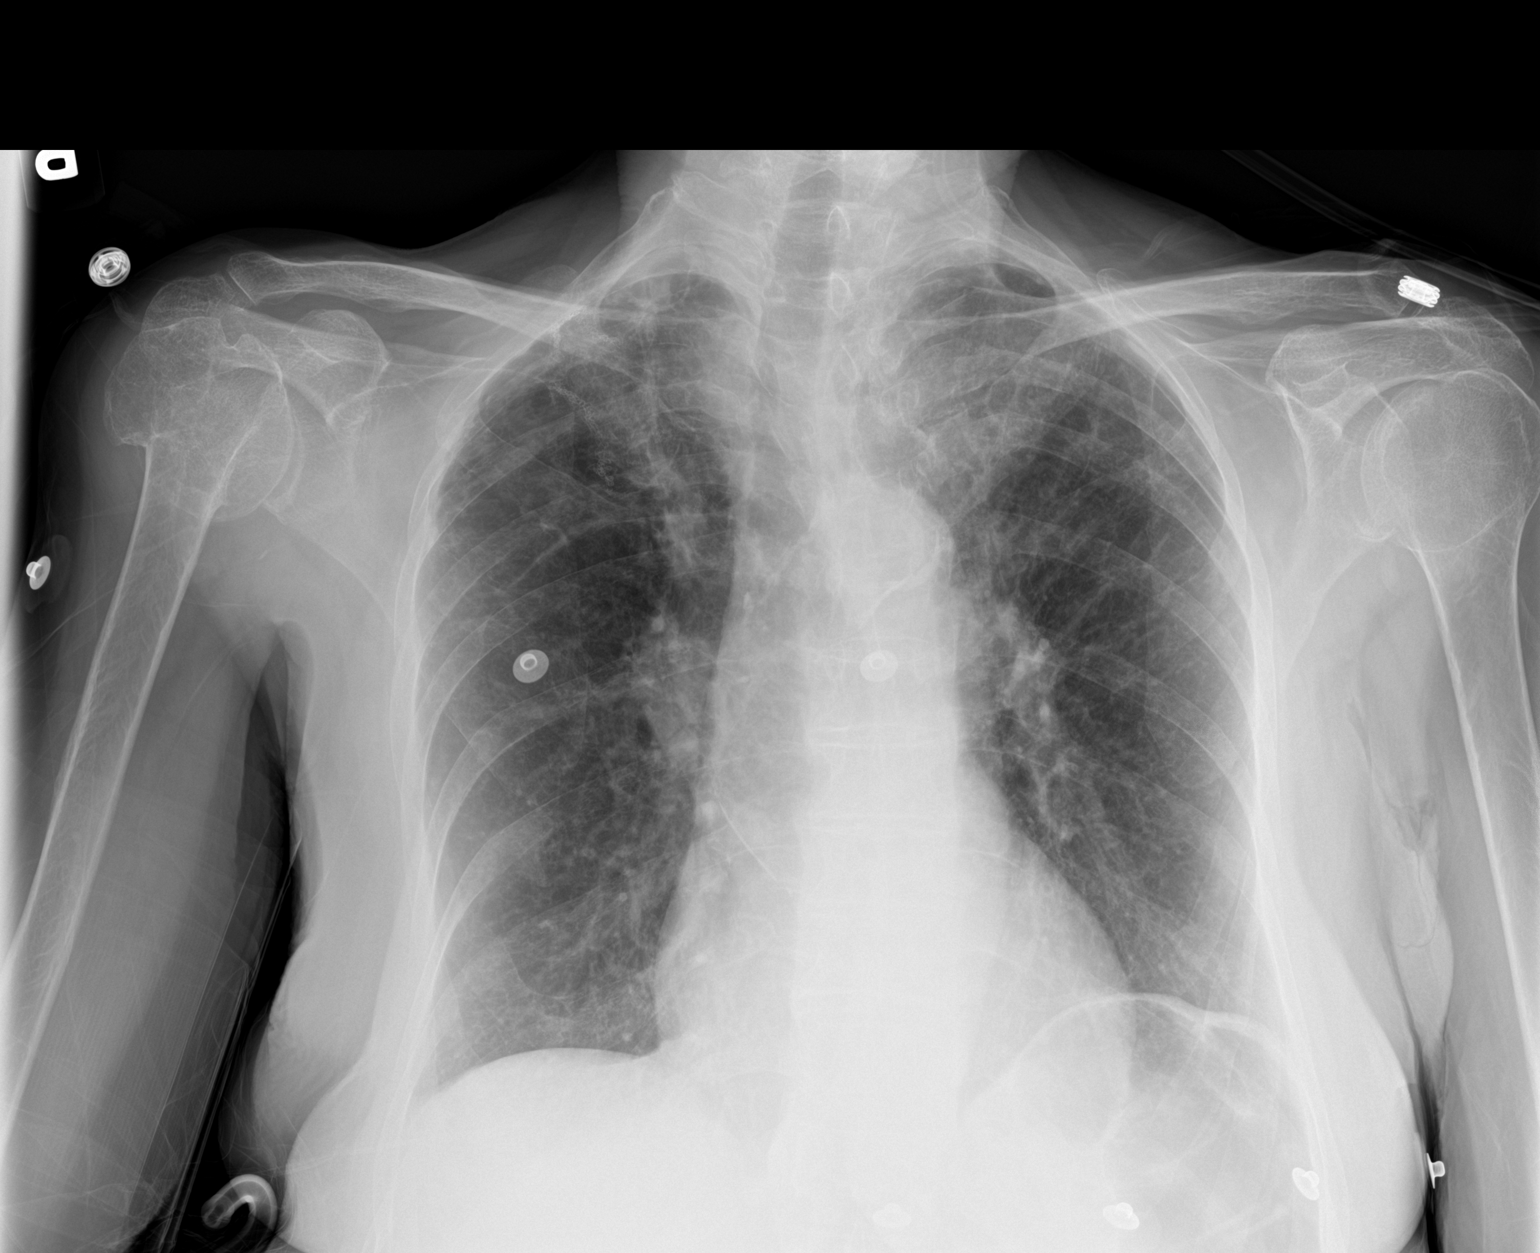

[2 of 2 positions shown; findings below may reference images not displayed]

FINDINGS: The heart is mildly enlarged. There is perihilar peribronchial
thickening. Lung sutures are identified at the right lung apex.
There is increasing opacity at the left lung apex raising the
question of infiltrate or mass is. Further evaluation with CT of the
chest is recommended.
IMPRESSION: 1. Bronchitic changes.
2. Developing density in the left lung apex warrants further
evaluation. CT of the chest with contrast is recommended.

## 2016-09-08 IMAGING — CT CT CHEST W/ CM
2 of 4 series · 15 of 36 positions shown, 18 images · IV contrast (omnipaque)
Comparison: Chest radiograph from 04/27/2014

CLINICAL DATA: Question of nodular density on chest radiograph at
the left lung apex. Further evaluation requested. Initial encounter.

EXAM:
CT CHEST WITH CONTRAST
TECHNIQUE: Multidetector CT imaging of the chest was performed during
intravenous contrast administration.
CONTRAST:  80mL OMNIPAQUE IOHEXOL 300 MG/ML  SOLN

[Series 2: thorax 5.0 i31f 1 · axial · 0.60mm/px · z∈[+498,+758]mm · 12 of 62 slices shown, 15 images]
[im 5/62  mediastinal]
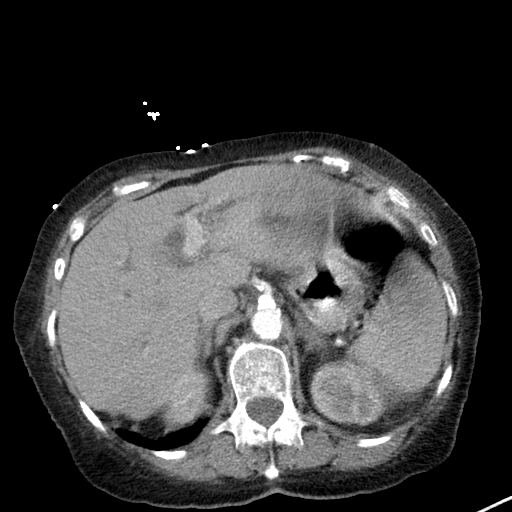
[im 5/62  lung]
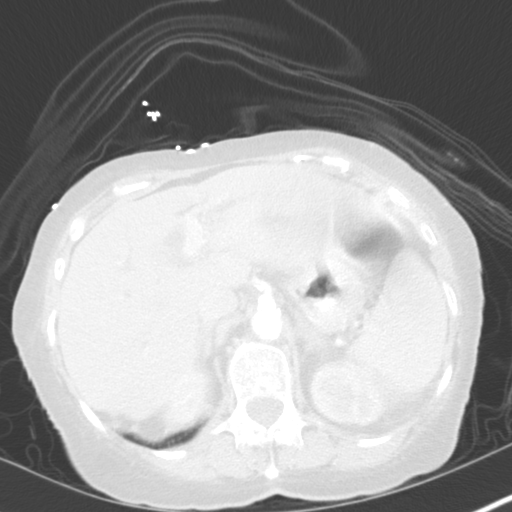
[im 10/62  lung]
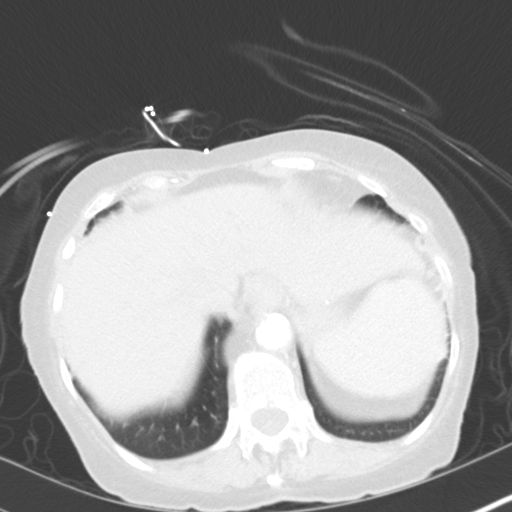
[im 15/62  lung]
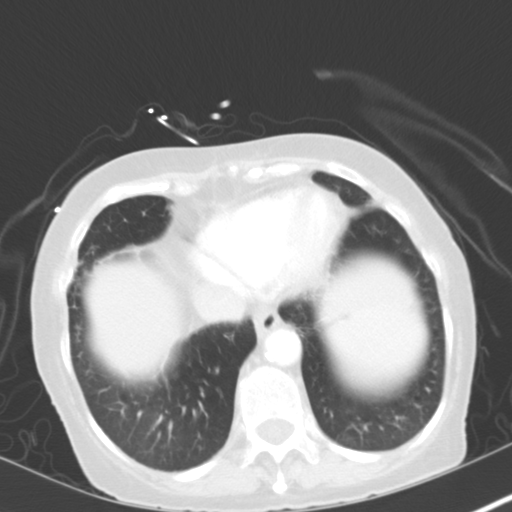
[im 19/62  lung]
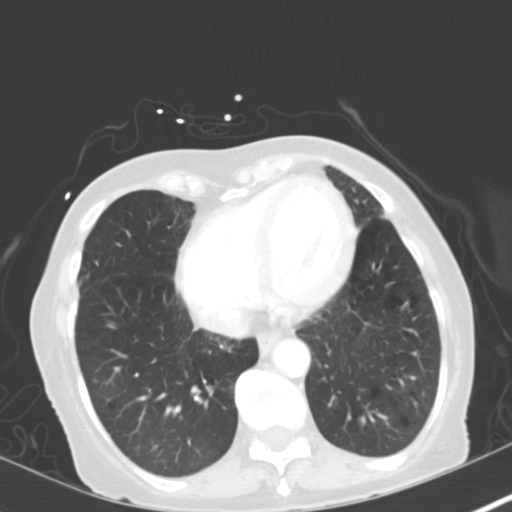
[im 24/62  mediastinal]
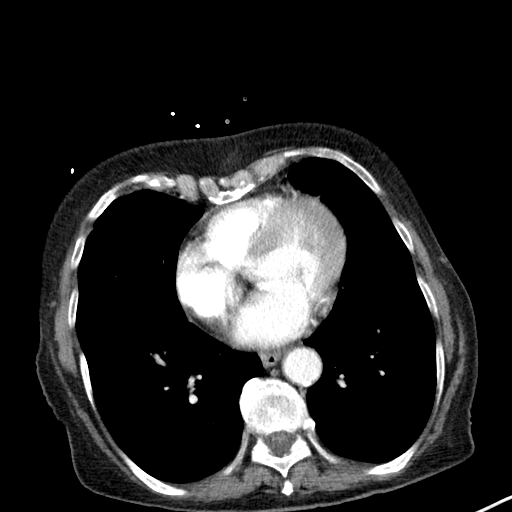
[im 24/62  lung]
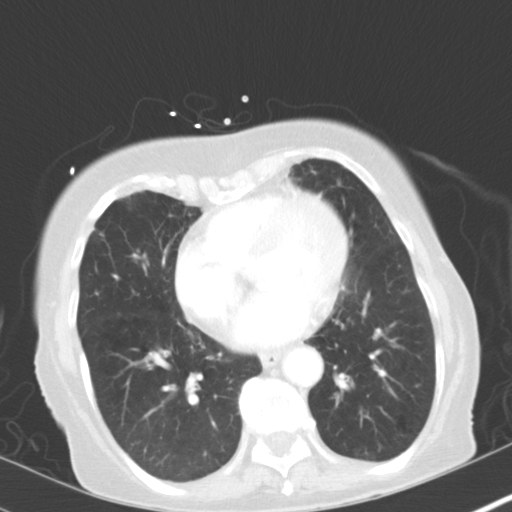
[im 29/62  lung]
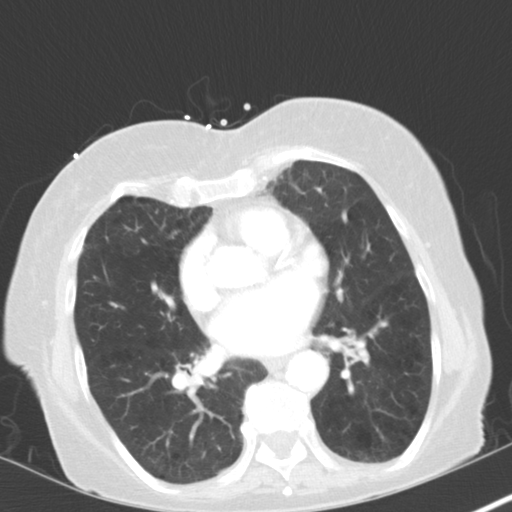
[im 33/62  lung]
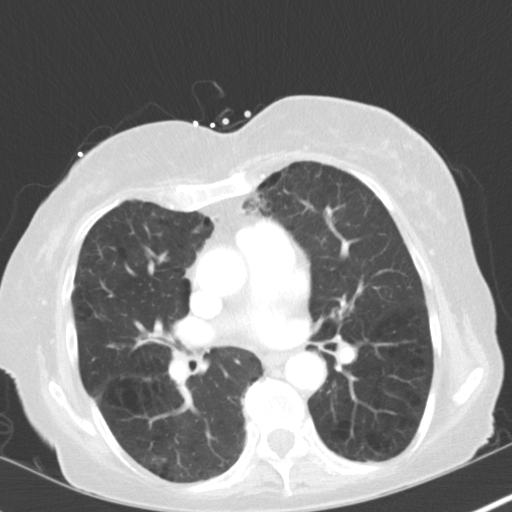
[im 38/62  lung]
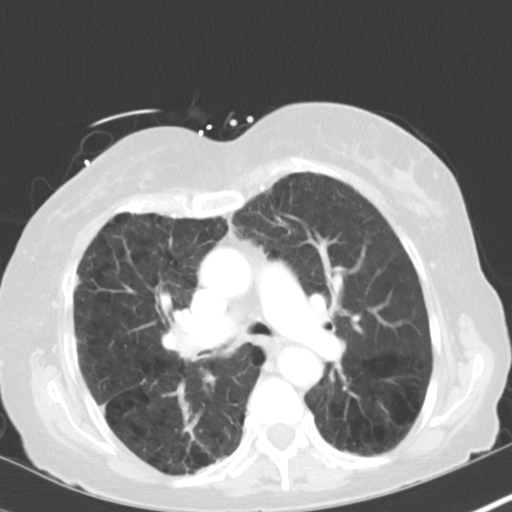
[im 43/62  mediastinal]
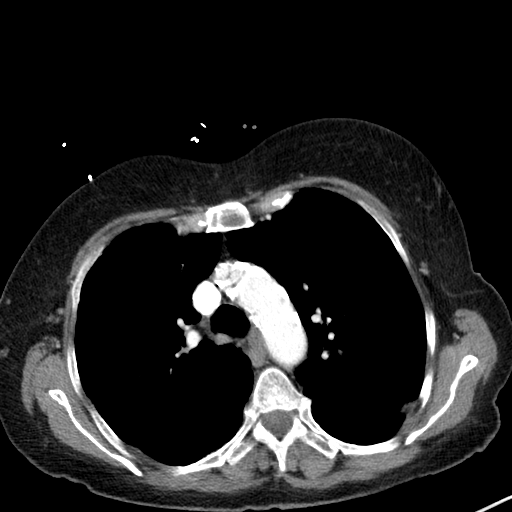
[im 43/62  lung]
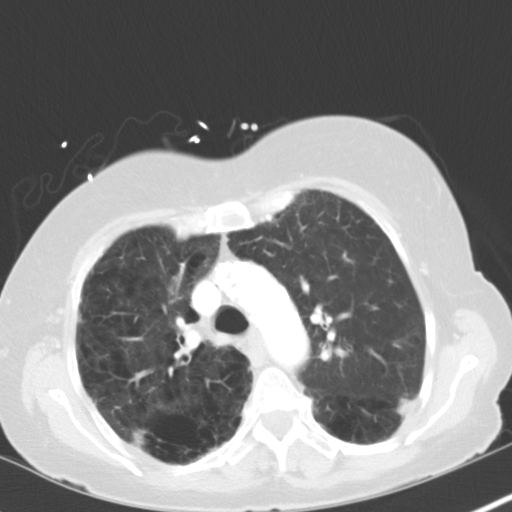
[im 47/62  lung]
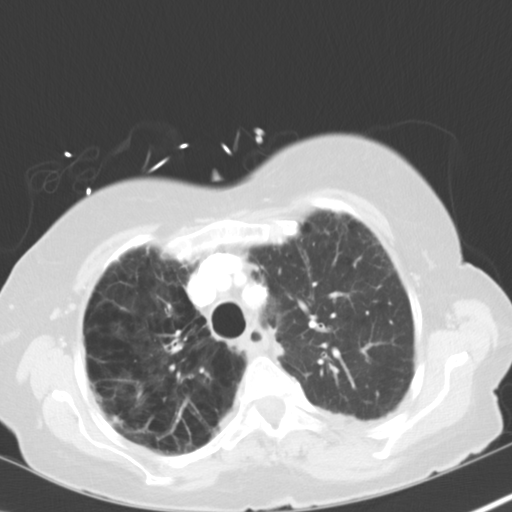
[im 52/62  lung]
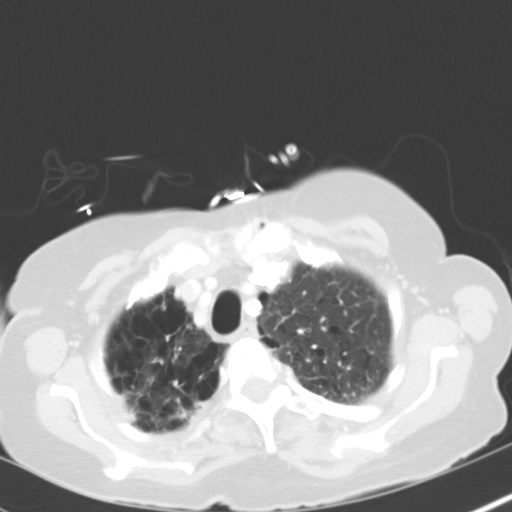
[im 57/62  lung]
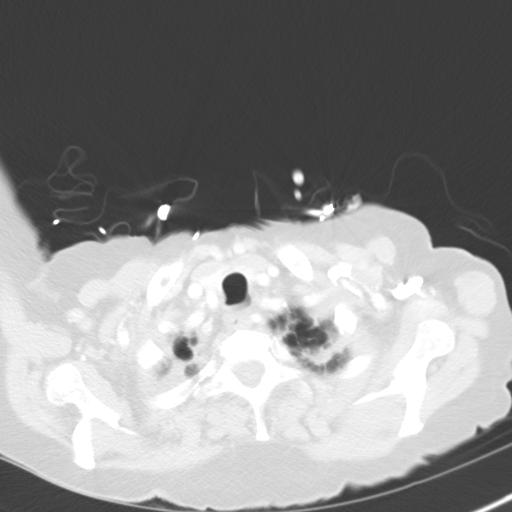

[Series 5: coronal · coronal · 0.60mm/px · 3 of 71 slices shown]
[im 15/71  lung]
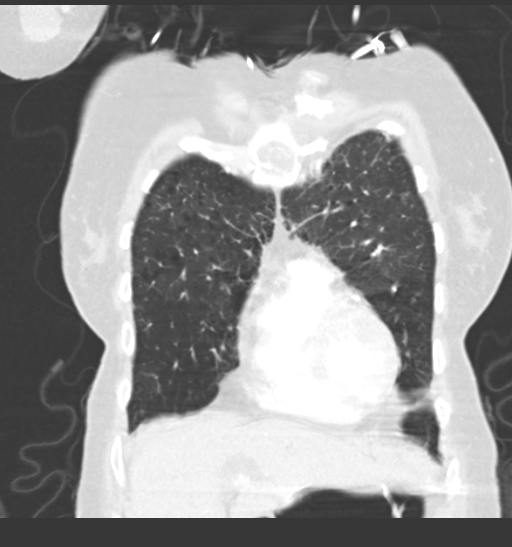
[im 29/71  lung]
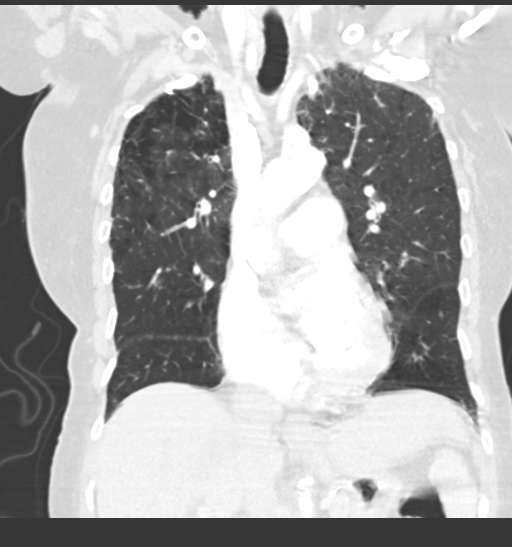
[im 43/71  lung]
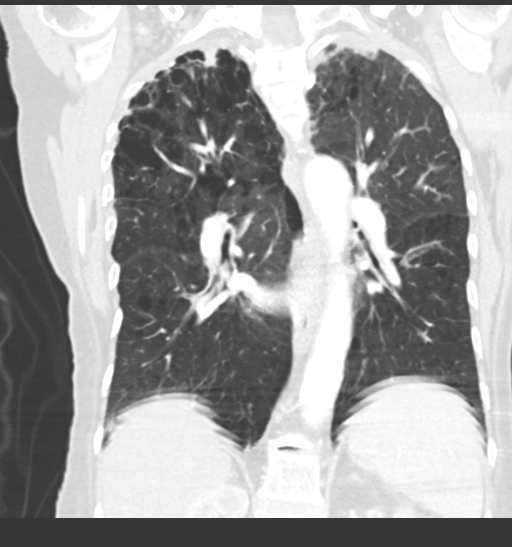

[15 of 36 positions shown; findings below may reference images not displayed]

FINDINGS: No pulmonary nodule is seen to correspond to the suspected density
on radiograph. Scattered nodularity is noted in the periphery of
both lungs, thought to reflect scarring, with scarring seen in both
lung apices. Bilateral emphysema is seen, most prominent at the
upper lung lobes. No acute focal consolidation, pleural effusion or
pneumothorax is identified.

Diffuse coronary artery calcifications are seen. The mediastinum is
otherwise unremarkable. No mediastinal lymphadenopathy is seen. No
pericardial effusion identified. Calcification is seen along the
aortic arch and proximal subclavian arteries bilaterally. The
visualized portions of the thyroid gland are unremarkable. No
axillary lymphadenopathy is seen.

The patient is status post cholecystectomy. There is underlying
chronic prominence of the biliary tree, with diffuse prominence of
the intrahepatic biliary ducts. The visualized portions of the
spleen are grossly unremarkable.

A 2.8 cm focus of vaguely decreased attenuation is noted at the left
kidney, with mild overlying soft tissue stranding. This is thought
reflect pyelonephritis. Diffuse calcification is seen along the
proximal abdominal aorta and its branches, including at the proximal
renal arteries and along the superior mesenteric artery.

No acute osseous abnormalities are seen.
IMPRESSION: 1. 2.8 cm focus [DATE] be decreased attenuation of the left kidney,
with mild overlying soft tissue stranding. This is thought reflect
acute pyelonephritis. This appears to be confirmed on evaluation of
recent urinalysis results. Would consider follow-up renal ultrasound
after completion of treatment, to ensure that the left kidney is
otherwise unremarkable.
2. No pulmonary nodule seen to correspond to the suspected density
on radiograph. Scattered nodularity in the periphery of both lungs
is thought reflects scarring, with scarring at the lung apices.
3. Bilateral emphysema, most prominent at the upper lung lobes.
4. Diffuse coronary artery calcifications seen.
5. Calcification noted along the aortic arch and proximal subclavian
arteries bilaterally.
6. Diffuse calcification along the proximal abdominal aorta and its
branches, including at the proximal renal arteries and along the
superior mesenteric artery.

These results were called by telephone at the time of interpretation
on 04/28/2014 at [DATE] to [REDACTED] on [facility], who verbally
acknowledged these results.

## 2016-10-19 IMAGING — CT CT ABD-PELV W/ CM
2 of 5 series · 15 of 46 positions shown, 17 images · IV contrast (Omni 300)
Comparison: 10/14/2013

CLINICAL DATA: Abdominal pain, hematuria, hematochezia

EXAM:
CT ABDOMEN AND PELVIS WITH CONTRAST
TECHNIQUE: Multidetector CT imaging of the abdomen and pelvis was performed
using the standard protocol following bolus administration of
intravenous contrast.
CONTRAST:  100mL OMNIPAQUE IOHEXOL 300 MG/ML  SOLN

[Series 2: abd/ pelvis 5.0 i30f 1 · axial · 0.71mm/px · z∈[-1085,-720]mm · 12 of 83 slices shown, 14 images]
[im 5/83  soft-tissue]
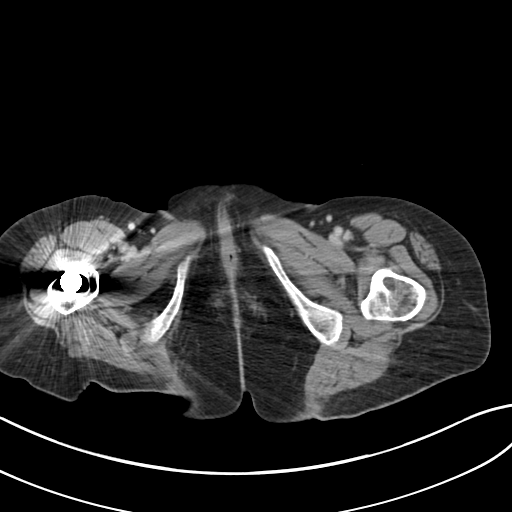
[im 5/83  bone]
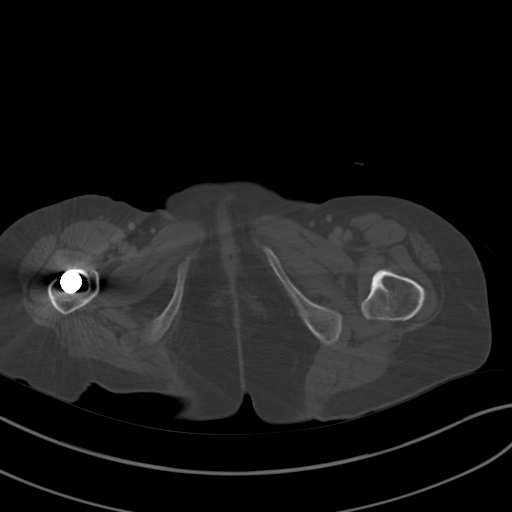
[im 13/83  soft-tissue]
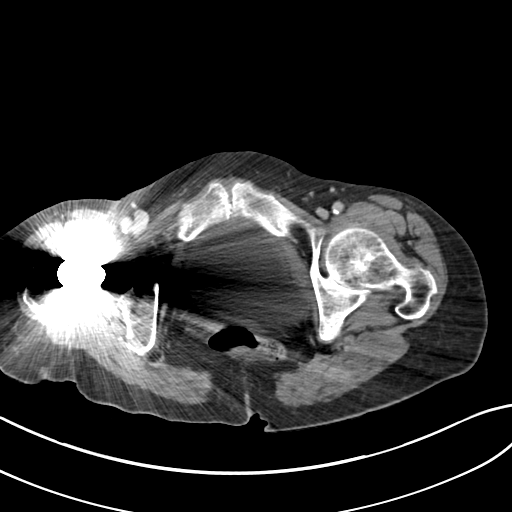
[im 17/83  soft-tissue]
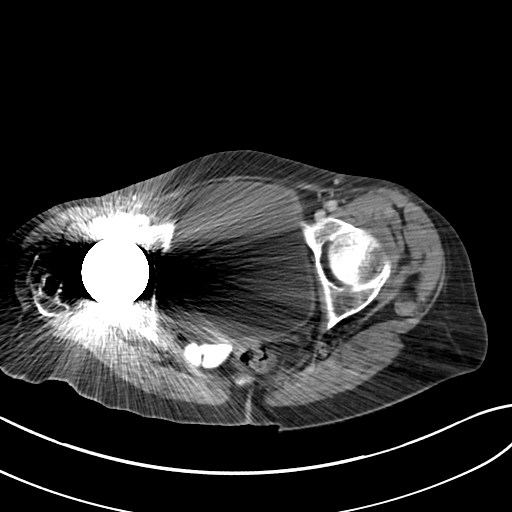
[im 25/83  soft-tissue]
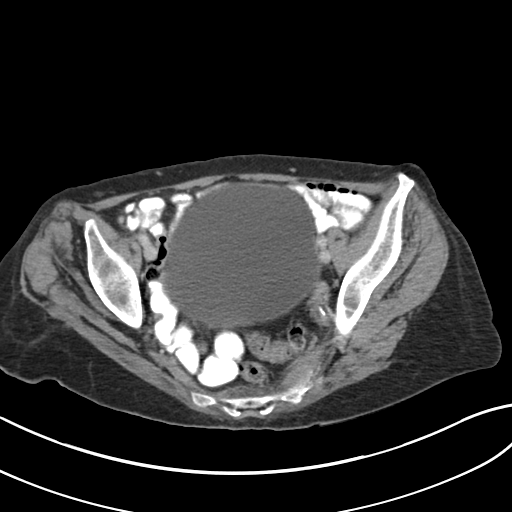
[im 33/83  soft-tissue]
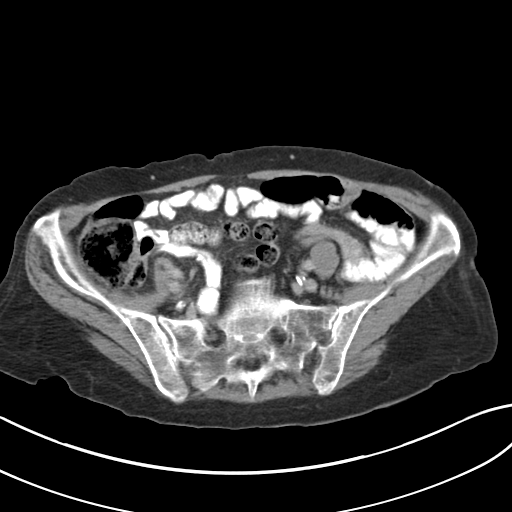
[im 37/83  soft-tissue]
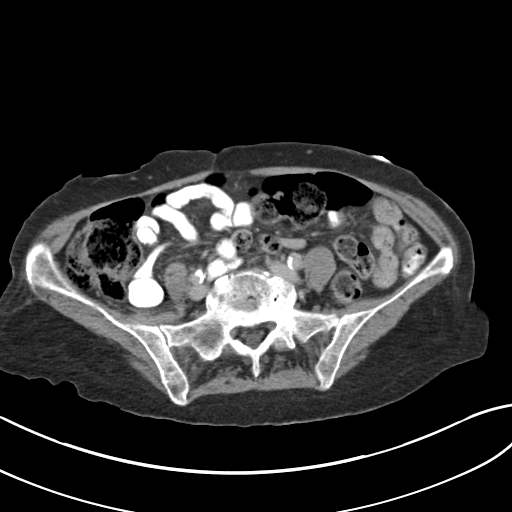
[im 46/83  soft-tissue]
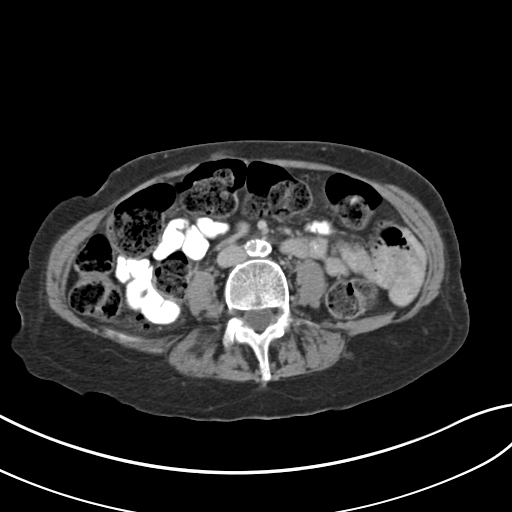
[im 50/83  soft-tissue]
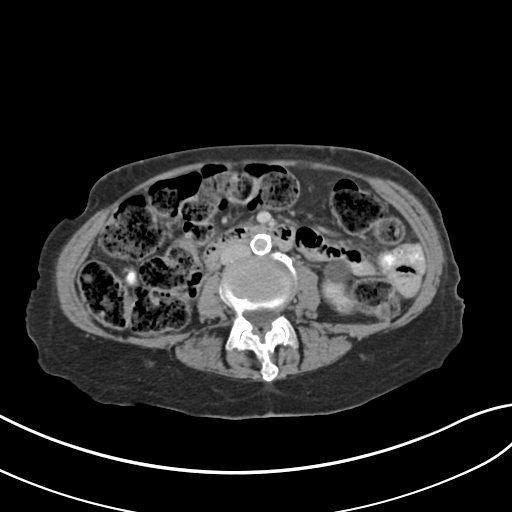
[im 58/83  soft-tissue]
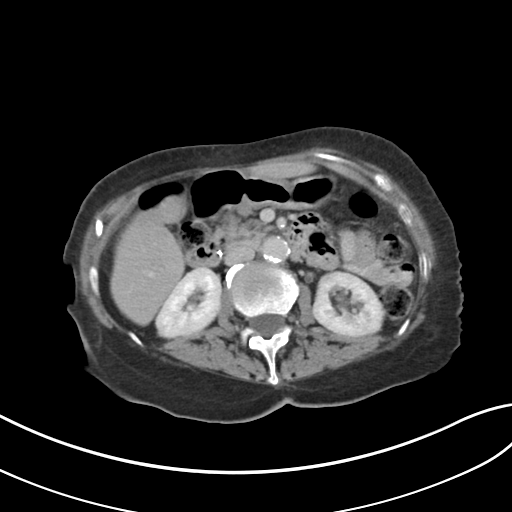
[im 58/83  bone]
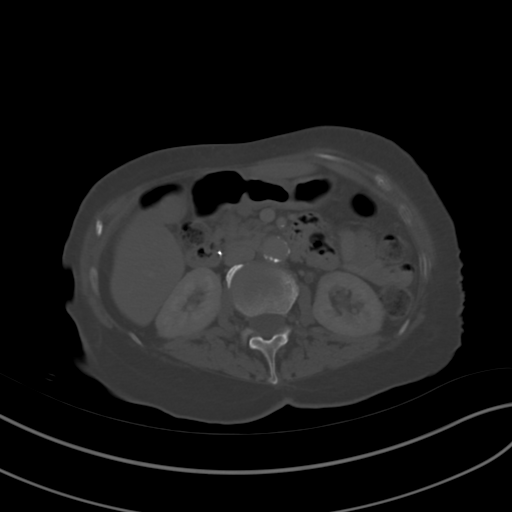
[im 66/83  soft-tissue]
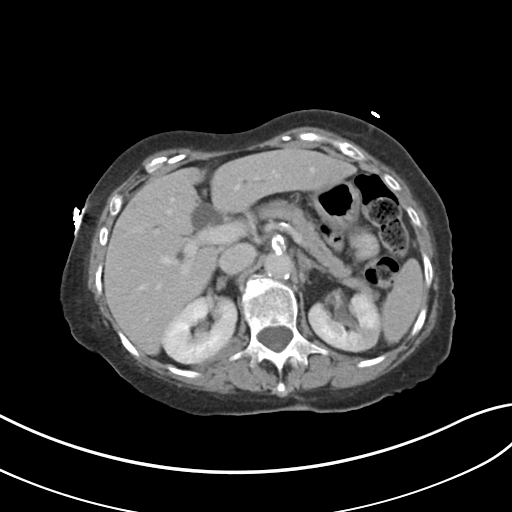
[im 70/83  soft-tissue]
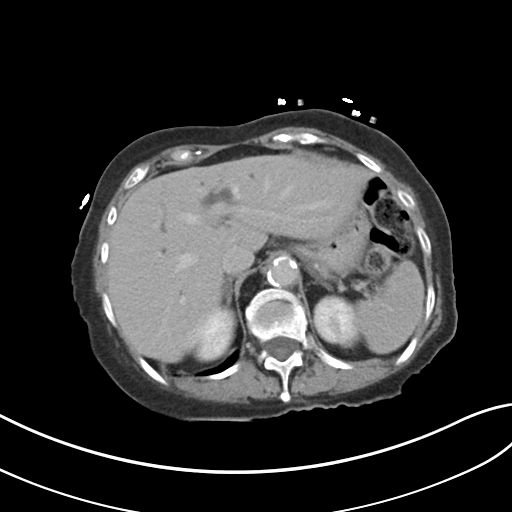
[im 78/83  soft-tissue]
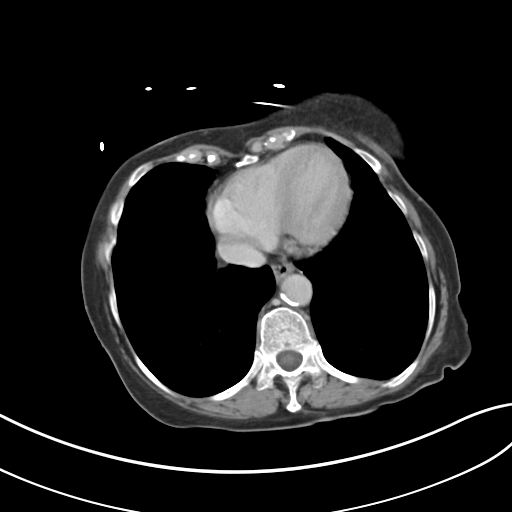

[Series 5: coronals · coronal · 0.62mm/px · 3 of 106 slices shown]
[im 36/106  soft-tissue]
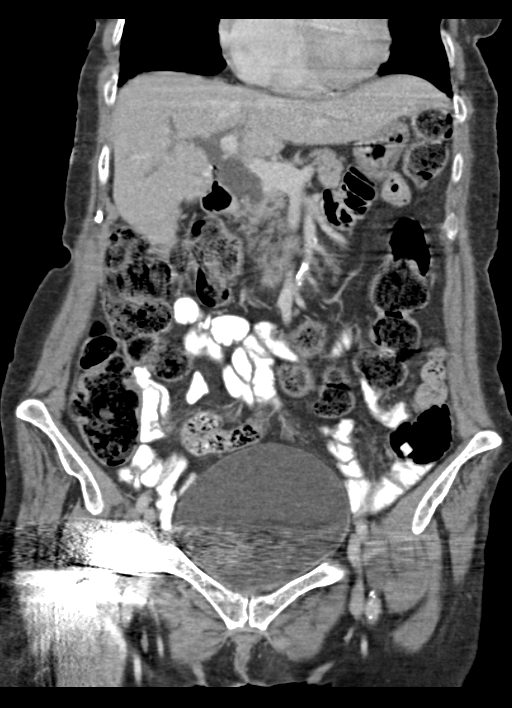
[im 47/106  soft-tissue]
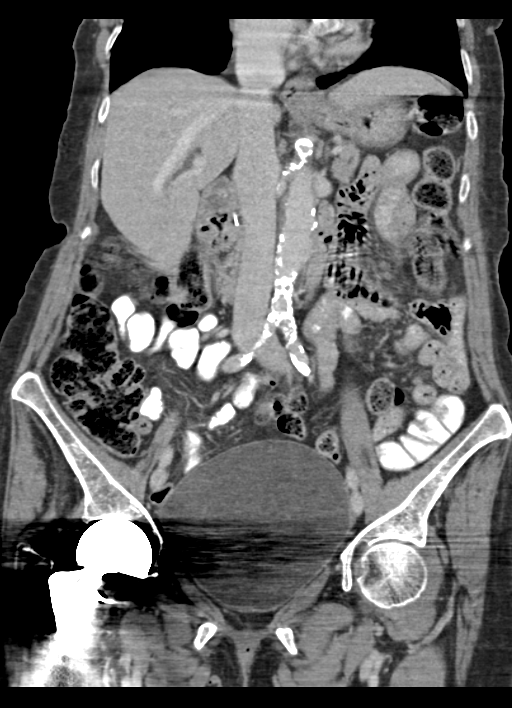
[im 59/106  soft-tissue]
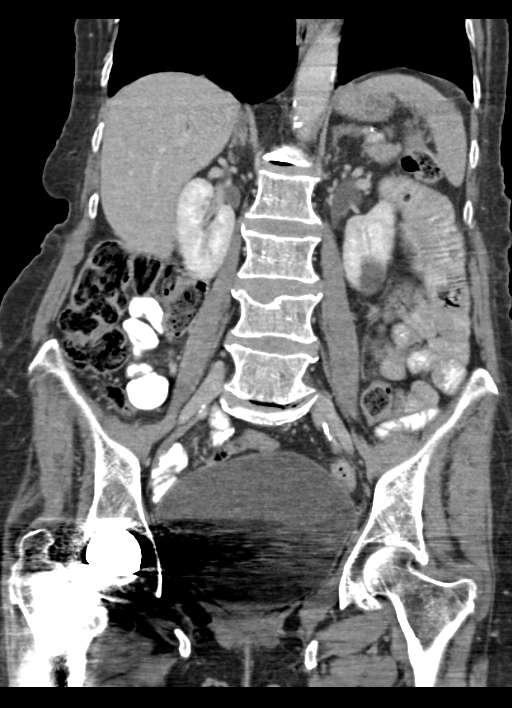

[15 of 46 positions shown; findings below may reference images not displayed]

FINDINGS: Sagittal images of the spine are unremarkable. Degenerative changes
thoracolumbar spine.

The patient is status post cholecystectomy. Stable mild intra or
extrahepatic biliary ductal dilatation. CBD measures 1 cm in
diameter.

The pancreas, spleen and adrenal glands are unremarkable.
Atherosclerotic calcifications of abdominal aorta and iliac arteries
again noted. Again noted ectatic distal abdominal aorta.
Atherosclerotic calcifications are SMA.

Enhanced kidneys are symmetrical in size. No hydronephrosis or
hydroureter. There is a cyst in lower pole anterior aspect of the
left kidney measures 1.8 cm. Stable from prior exam.

Delayed renal images shows bilateral renal symmetrical excretion.
Bilateral visualized proximal ureter is unremarkable.

Abundant stool is noted in right colon and transverse colon. No
pericecal inflammation.

There is no small bowel obstruction. There is a distended urinary
bladder. There is focal high density pre within posterior aspect of
the bladder axial image 62 measures 1.4 cm. Blood products/ clot
cannot be excluded. Clinical correlation is necessary. Extensive
metallic artifact are noted from right hip prosthesis limiting
evaluation of the pelvis. The patient is status post hysterectomy.

Some stool are noted in distal sigmoid colon and rectum. There is no
distal colonic obstruction. No evidence of colitis or
diverticulitis.
IMPRESSION: 1. There is no acute inflammatory process within abdomen.
2. Status postcholecystectomy. Stable mild intra and extrahepatic
biliary ductal dilatation.
3. Abundant stool noted in right colon and transverse colon. No
small bowel or colonic obstruction.
4. No pericecal inflammation.  Unremarkable terminal ileum.
5. Moderate distended urinary bladder. Question small focal
debris/clot within posterior aspect of the bladder. See axial image
62. Clinical correlation is necessary.
6. No evidence of colitis or diverticulitis.
7. Extensive atherosclerotic vascular calcifications. Again noted
ectatic distal abdominal aorta appear

## 2017-01-25 IMAGING — DX DG CHEST 2V
2 series · 2 of 2 positions shown · non-contrast
Comparison: CT chest 04/28/2014. Chest x-rays 04/27/2014 and
earlier.

CLINICAL DATA: One week history of cough, shortness of breath and
chest tightness. Former smoker who quit approximately 9 months ago.
Current history of hypertension, COPD and chronic kidney disease.

EXAM:
CHEST  2 VIEW

[chest lat]
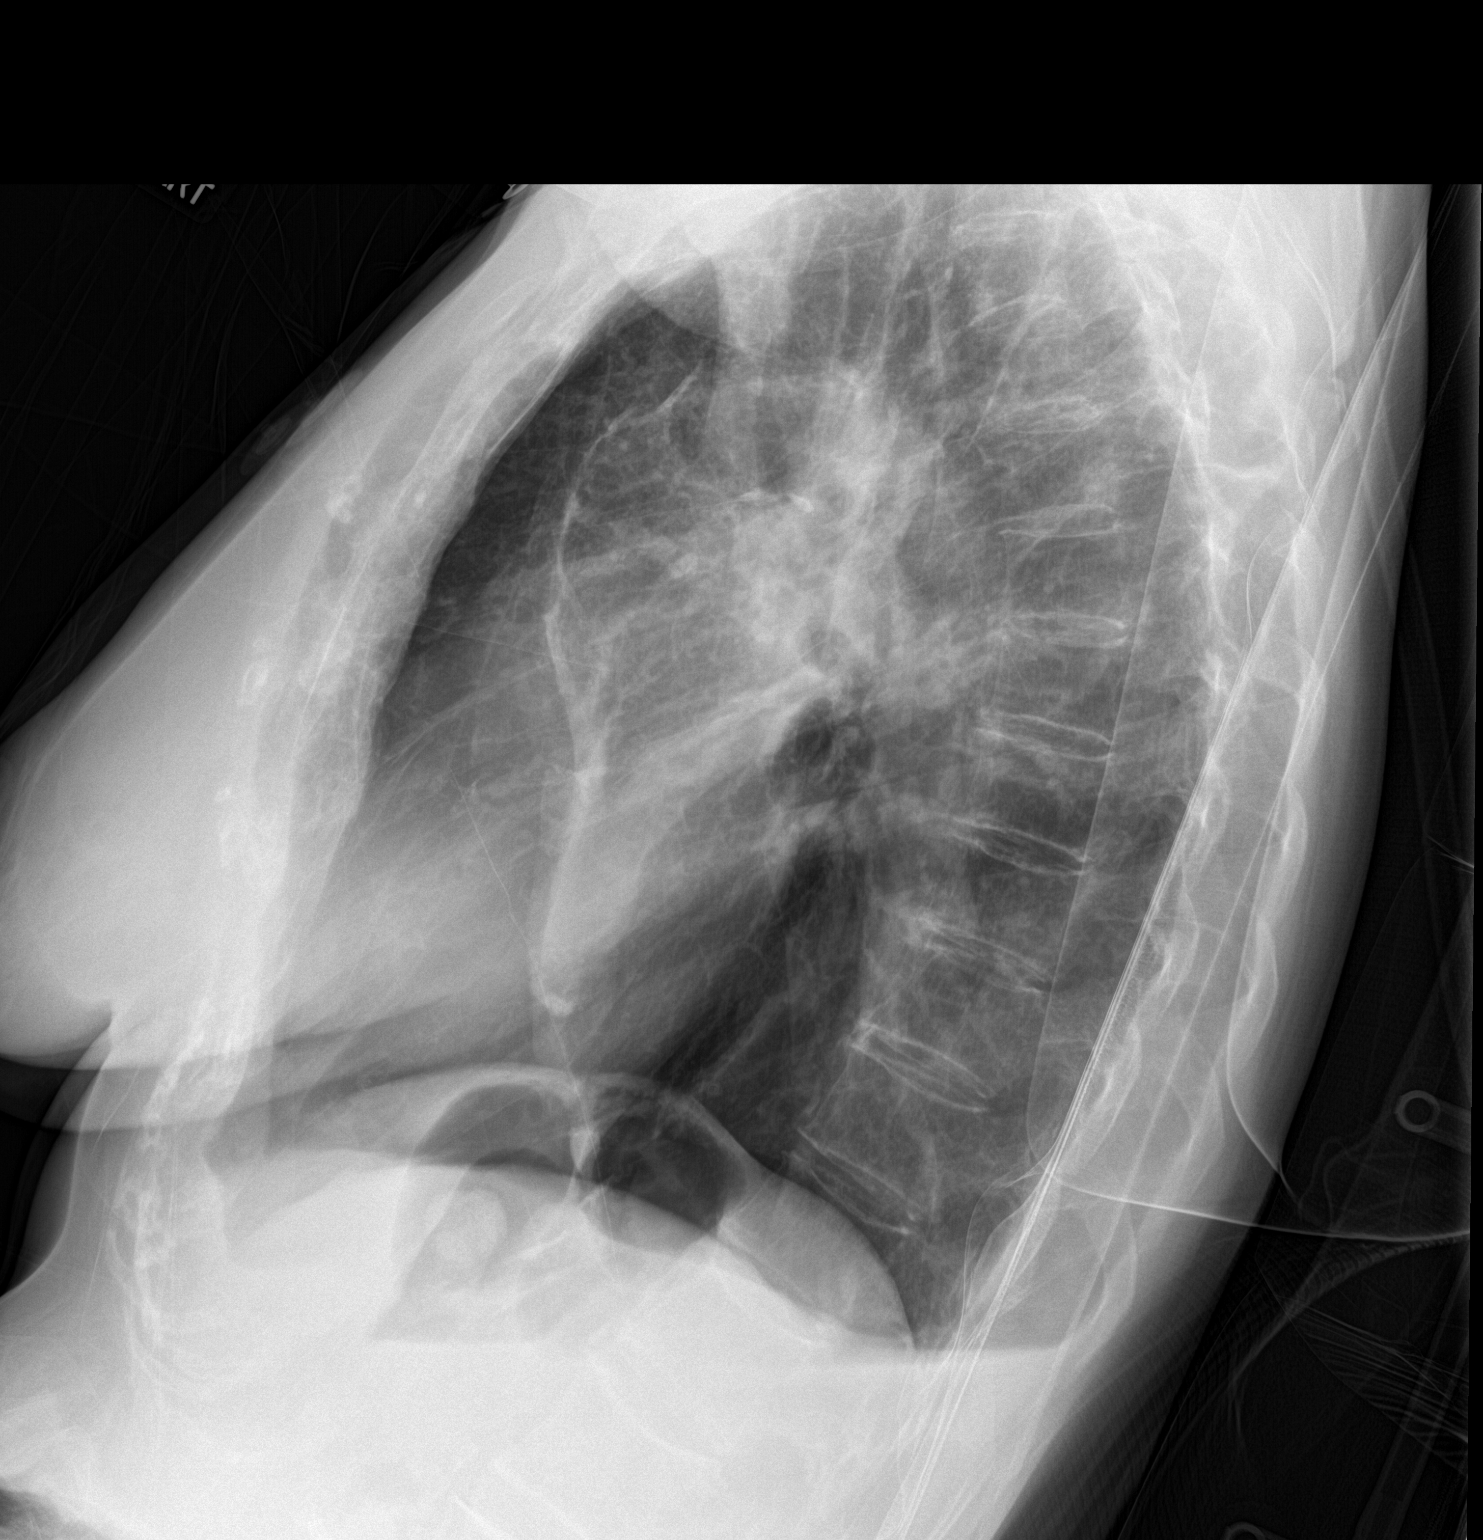

[chest ap]
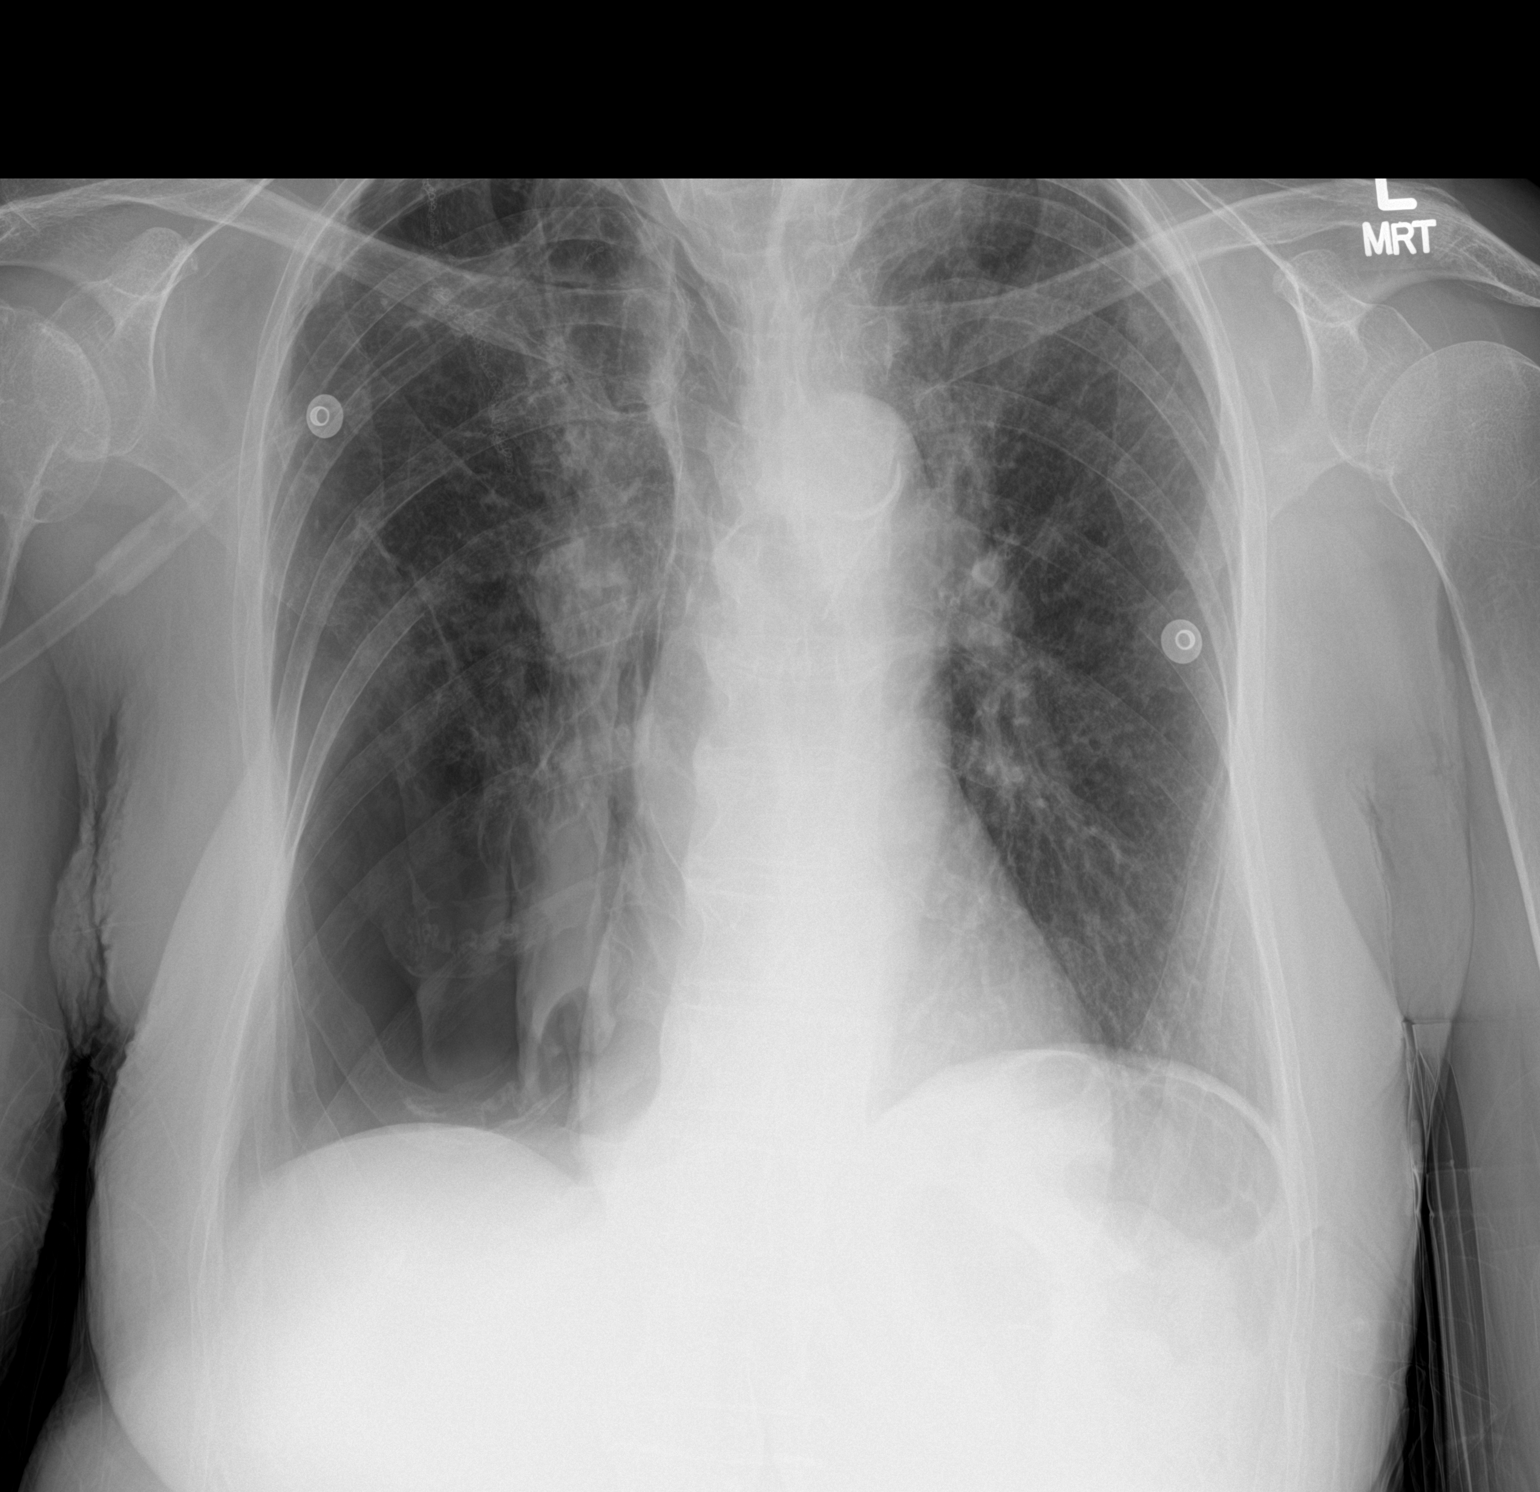

[2 of 2 positions shown; findings below may reference images not displayed]

FINDINGS: Right basilar pneumothorax on the order of 30-40% or so. The upper
right lung is tethered to the pleural by scar and did not collapse.
Surgical suture material in the right upper lobe presumably related
to prior bullectomy. Emphysematous changes throughout both lungs and
prominent bronchovascular markings diffusely with moderate central
peribronchial thickening, unchanged.

Cardiac silhouette upper normal in size. Thoracic aorta
atherosclerotic, unchanged. Prominent central pulmonary arteries,
unchanged. Degenerative changes throughout the thoracic spine.
IMPRESSION: 1. Moderate size right basilar pneumothorax on the order of 30-40%
or so. The upper lung did not collapse and is therefore likely
tethered to the pleural by scar.
2.  No acute cardiopulmonary disease otherwise.
3. COPD/emphysema.
Critical Value/emergent results were called by telephone at the time
of interpretation on 09/14/2014 at [DATE] to Dr. HERNANDO HILLIARD,
who verbally acknowledged these results.

## 2017-01-25 IMAGING — CR DG CHEST 1V PORT
1 series · 1 of 1 positions shown · non-contrast
Comparison: 09/14/2014 at [DATE] a.m.

CLINICAL DATA: Right pneumothorax.  Emphysema.

EXAM:
PORTABLE CHEST - 1 VIEW to 11 p.m.

[AP]
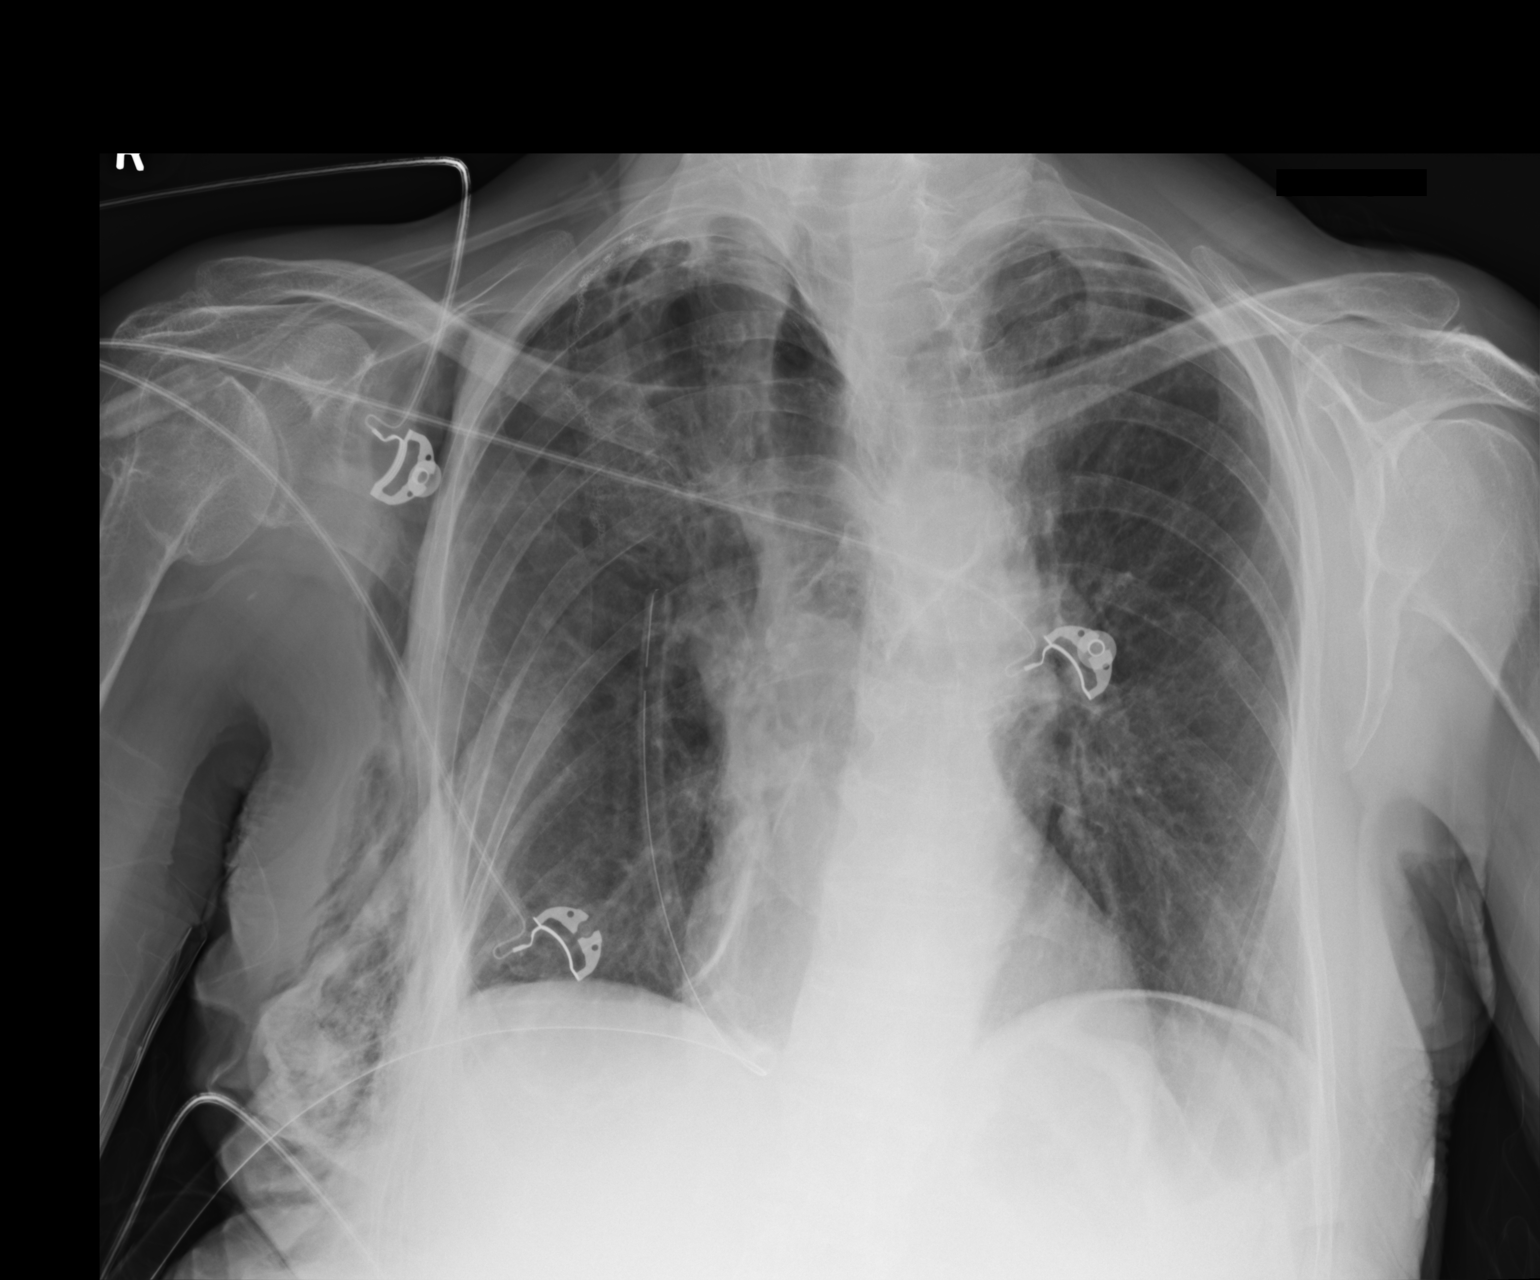

[1 of 1 positions shown; findings below may reference images not displayed]

FINDINGS: Right chest tube has been inserted. There has been complete relief
of the right pneumothorax. Surgical staples at the right apex. Left
lung is clear. Heart size and vascularity are normal. Calcification
and tortuosity of the thoracic aorta. Subcutaneous emphysema on the
right.
IMPRESSION: Right chest tube in good position with complete relief of the right
pneumothorax. New right subcutaneous emphysema. Pulmonary emphysema.

## 2017-01-26 IMAGING — CR DG CHEST 1V PORT
1 series · 1 of 1 positions shown · non-contrast
Comparison: 09/14/2014.

CLINICAL DATA: Short of breath.

EXAM:
PORTABLE CHEST - 1 VIEW

[AP]
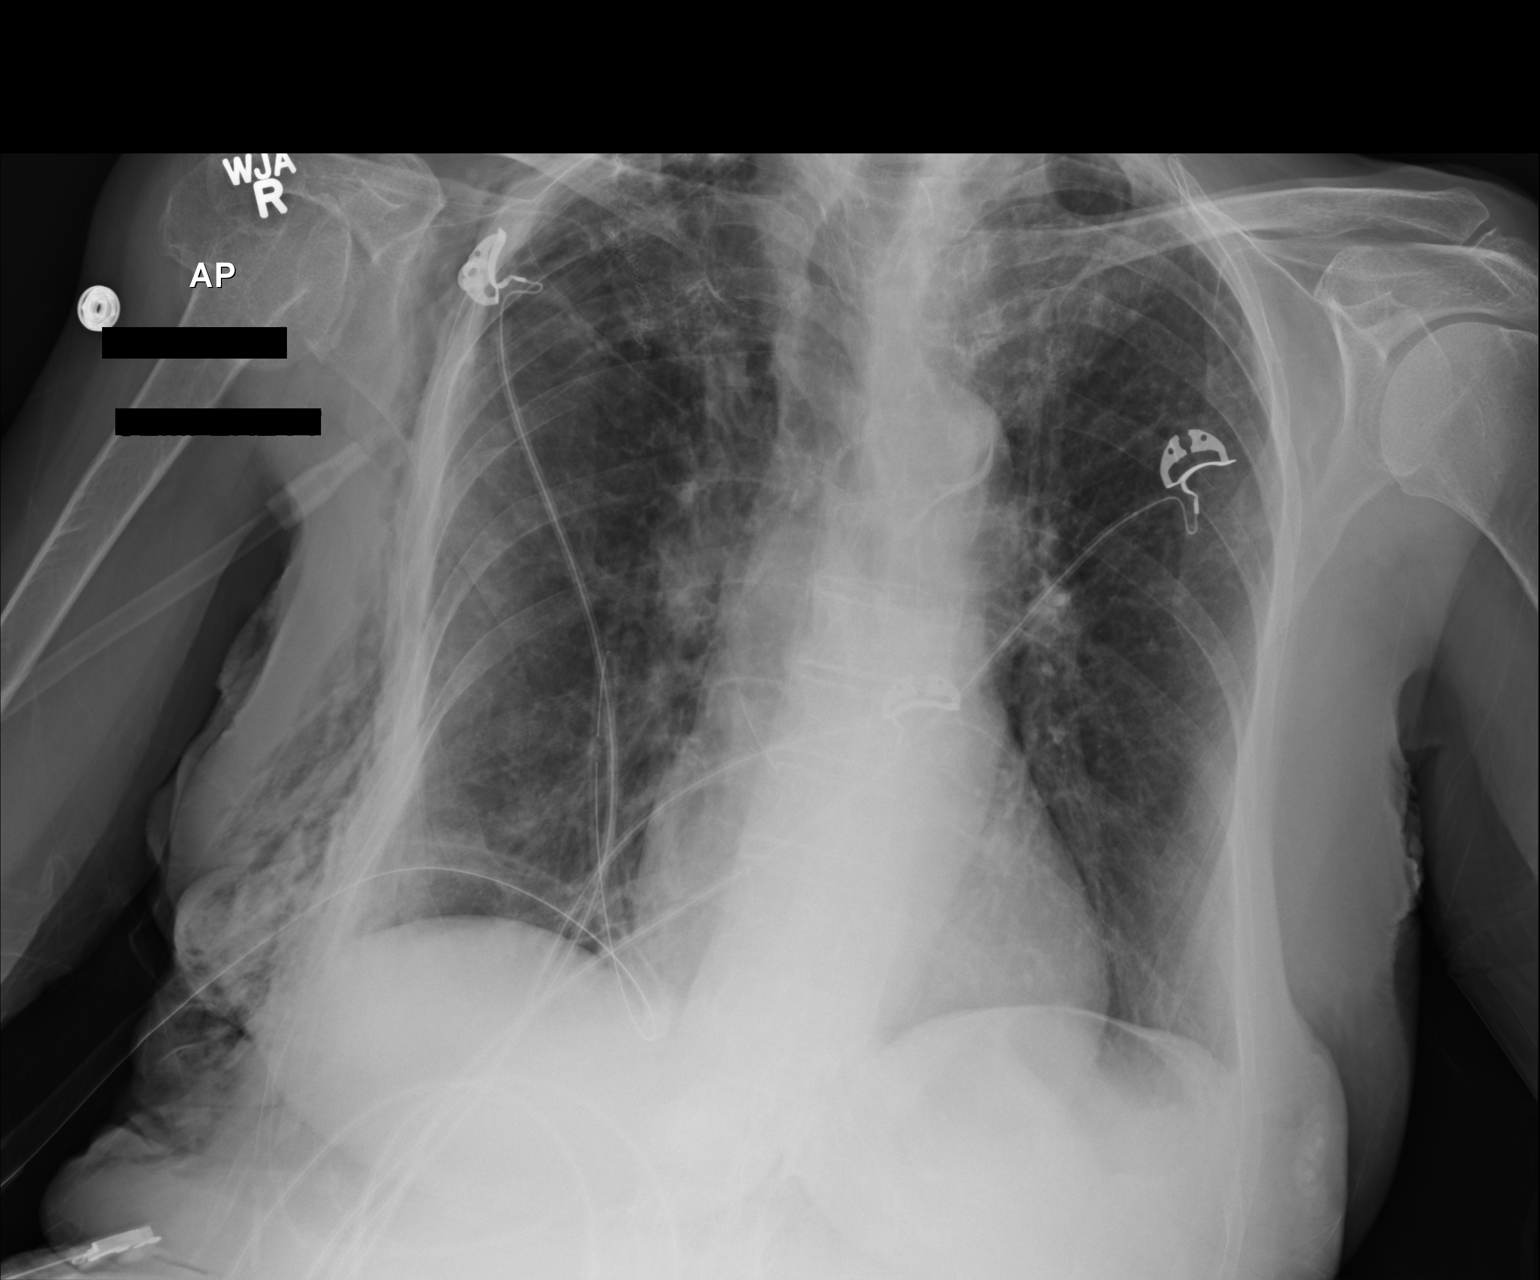

[1 of 1 positions shown; findings below may reference images not displayed]

FINDINGS: Cardiopericardial silhouette within normal limits. Aortic arch
atherosclerosis. The RIGHT thoracostomy tube has been slightly
retracted and the tip is now just inferior to the level of the RIGHT
hilum. Apical scarring is present with superior hilar retraction.

Soft tissue emphysema along the RIGHT chest wall is unchanged.
Chronic deformity of the proximal RIGHT humerus.

No pneumothorax is visible. Severe emphysema. Pulmonary staples are
present at the RIGHT apex adjacent to scarring.
IMPRESSION: 1. Unchanged RIGHT thoracostomy tube.  No pneumothorax.
2. Severe emphysema and bilateral pleural apical scarring similar to
prior exam. No superimposed airspace disease or acute
cardiopulmonary disease.

## 2017-01-27 IMAGING — CR DG CHEST 1V PORT
1 series · 1 of 1 positions shown · non-contrast
Comparison: 09/15/2014

CLINICAL DATA: Followup pneumothorax.

EXAM:
PORTABLE CHEST - 1 VIEW

[AP]
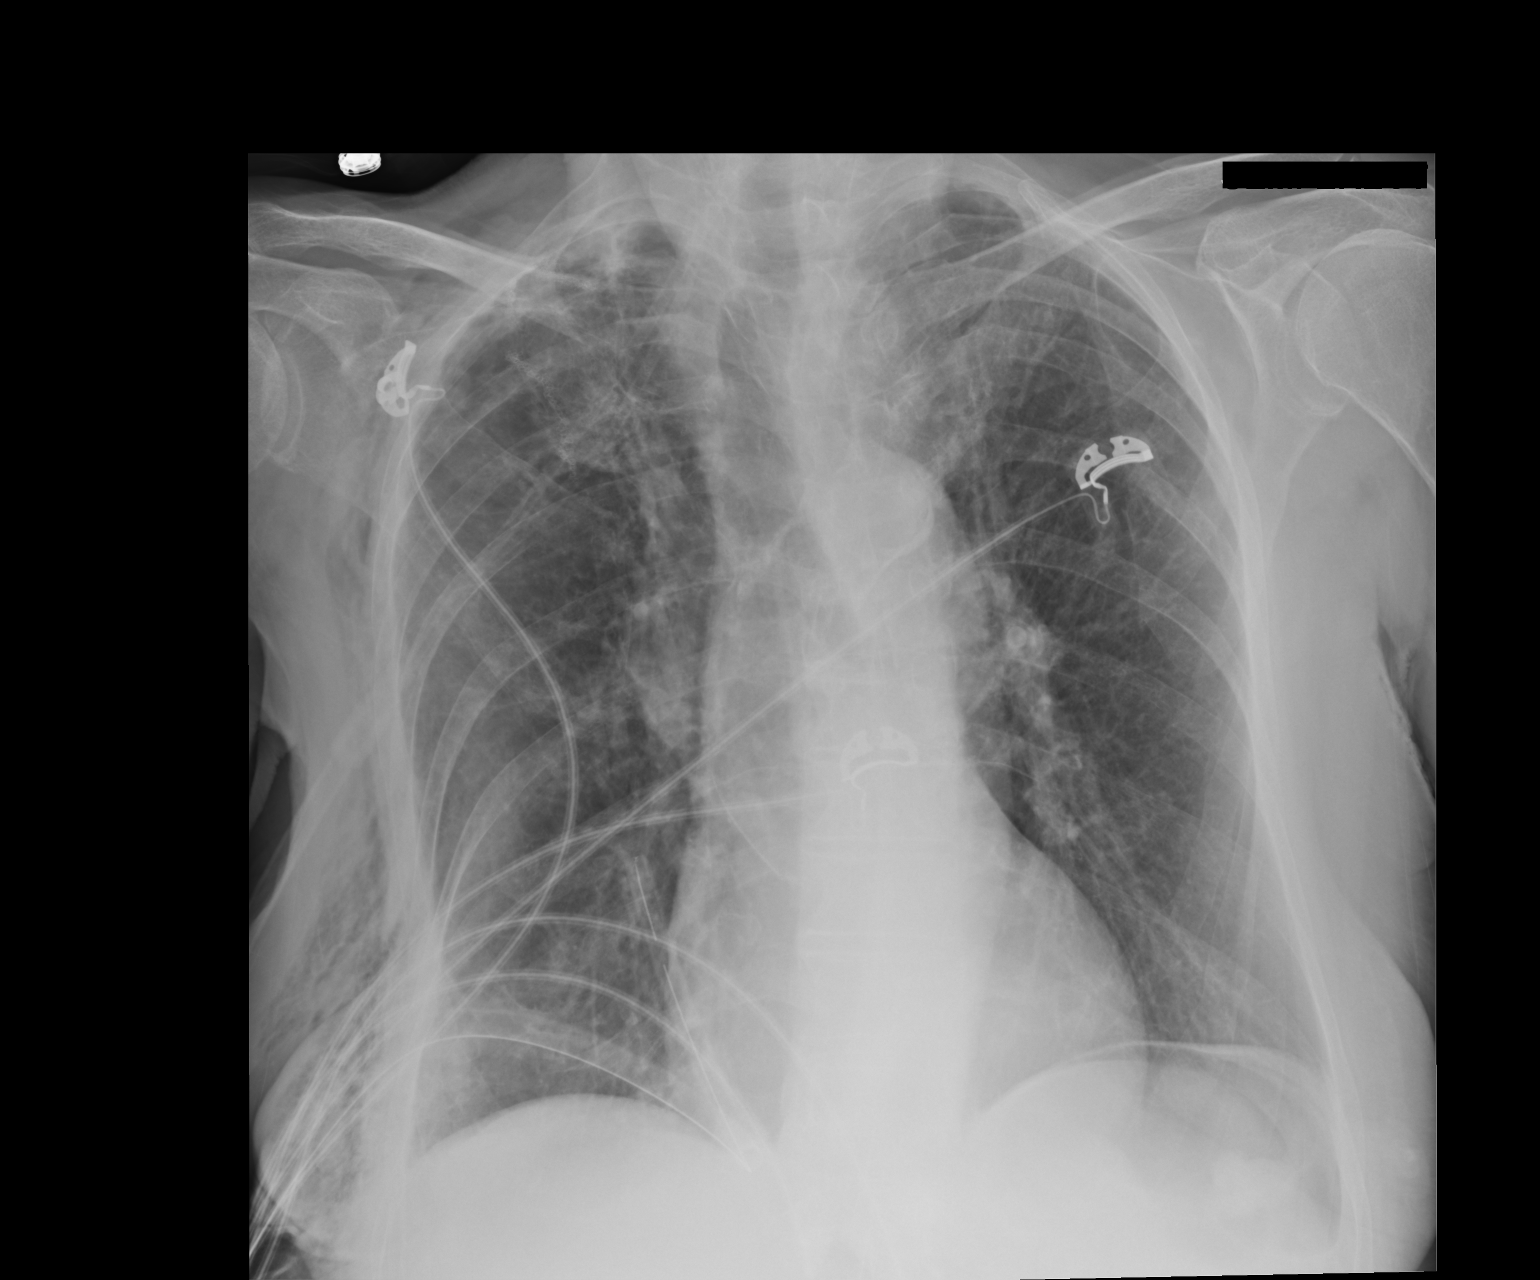

[1 of 1 positions shown; findings below may reference images not displayed]

FINDINGS: Stable right-sided basilar chest tube without definite right-sided
pneumothorax. Stable surgical changes involving the right upper
lobe. Resolving subcutaneous emphysema. Stable emphysematous changes
and apical pulmonary scarring. No acute overlying pulmonary process.
IMPRESSION: Stable right-sided chest tube without definite pneumothorax.
Resolving subcutaneous emphysema.

Chronic emphysematous changes but no acute overlying pulmonary
process.

## 2017-01-29 IMAGING — CR DG CHEST 1V PORT
1 series · 1 of 1 positions shown · non-contrast
Comparison: Portable chest x-ray September 17, 2014

CLINICAL DATA: Follow-up of right-sided chest tube positioning

EXAM:
PORTABLE CHEST - 1 VIEW

[AP]
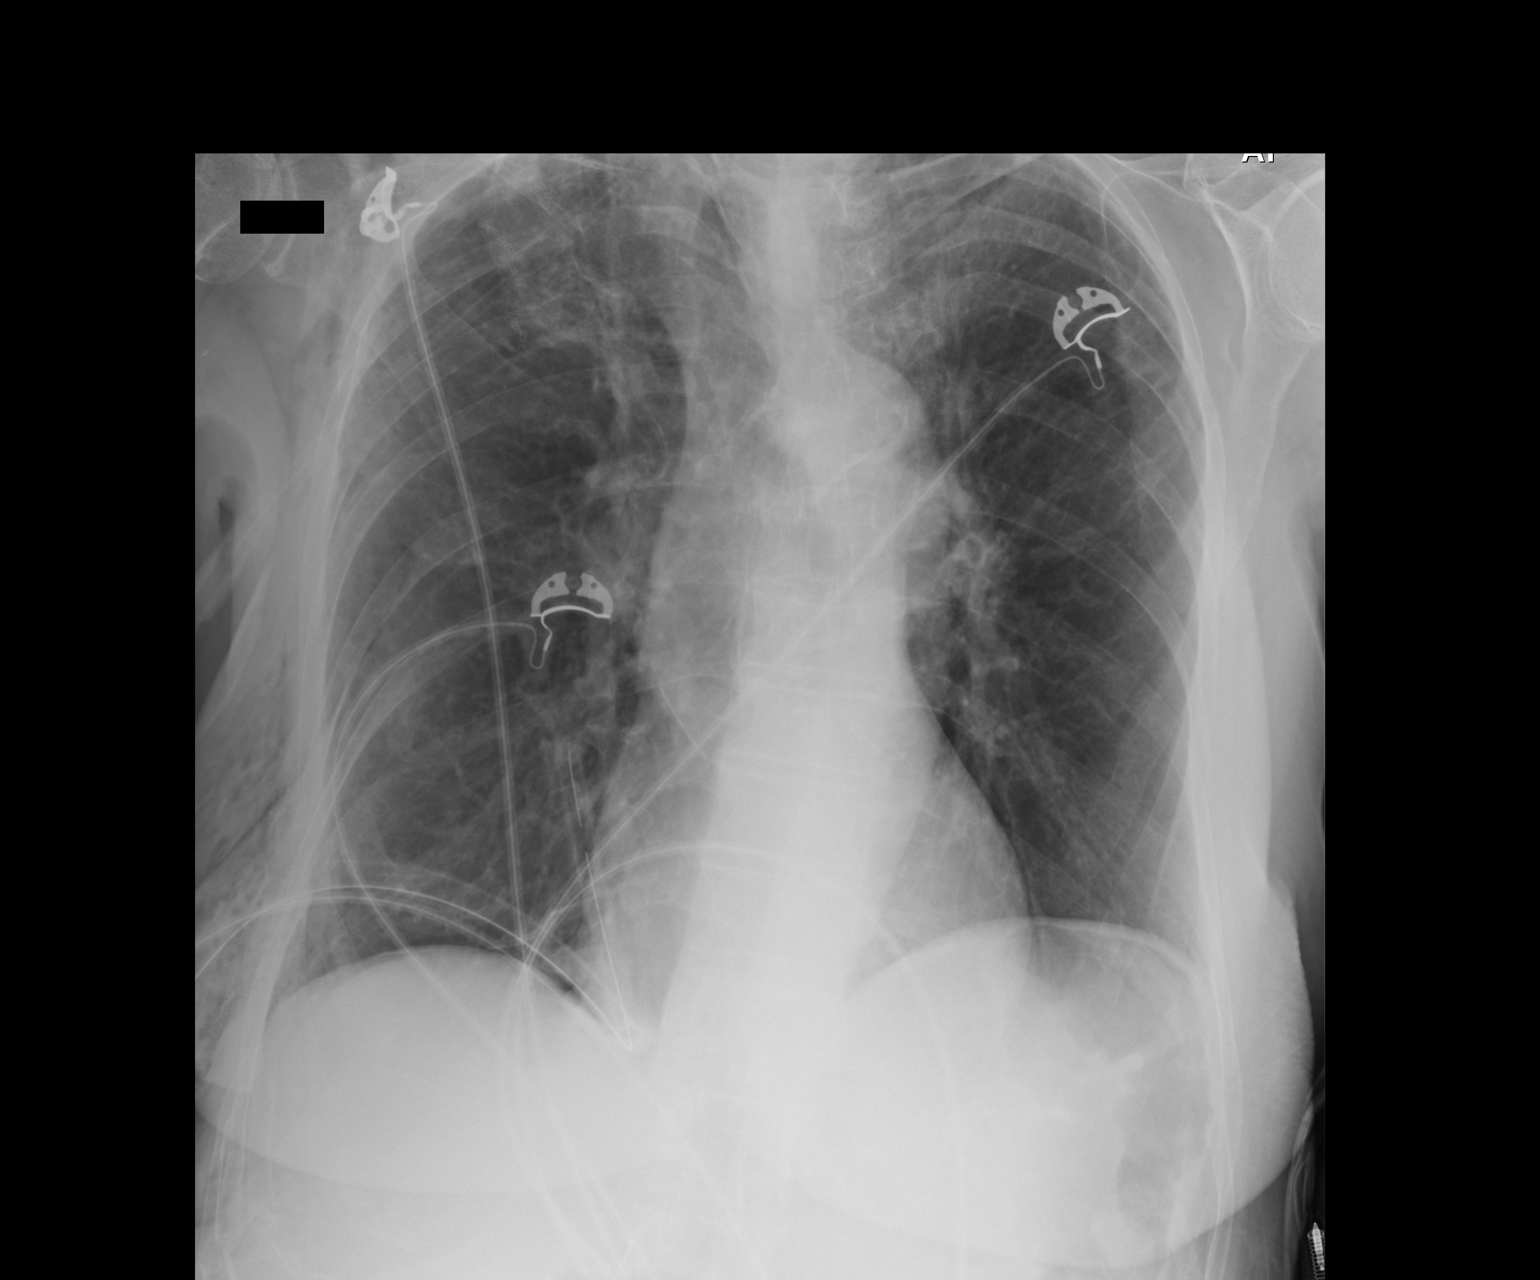

[1 of 1 positions shown; findings below may reference images not displayed]

FINDINGS: The right-sided chest tube is unchanged in appearance with its tip
projecting over the posterior aspect of the ninth rib. There is no
pneumothorax or pleural effusion. There is no mediastinal shift.
Both lungs are clear with exception of the apices where persistently
increased interstitial markings are present. The heart and pulmonary
vascularity are normal. There is gentle levocurvature centered in
the mid thoracic spine. Subcutaneous emphysema persists in the right
axillary region.
IMPRESSION: 1. The chest tube is in stable position. There is no pneumothorax.
Stable increased interstitial density in the apices bilaterally.
2. There is no pneumonia nor CHF.

## 2017-01-29 IMAGING — CR DG CHEST 1V PORT
1 series · 1 of 1 positions shown · non-contrast
Comparison: 09/18/2014 at 6000 hr

CLINICAL DATA: Status post chest tube removal.

EXAM:
PORTABLE CHEST - 1 VIEW

[AP]
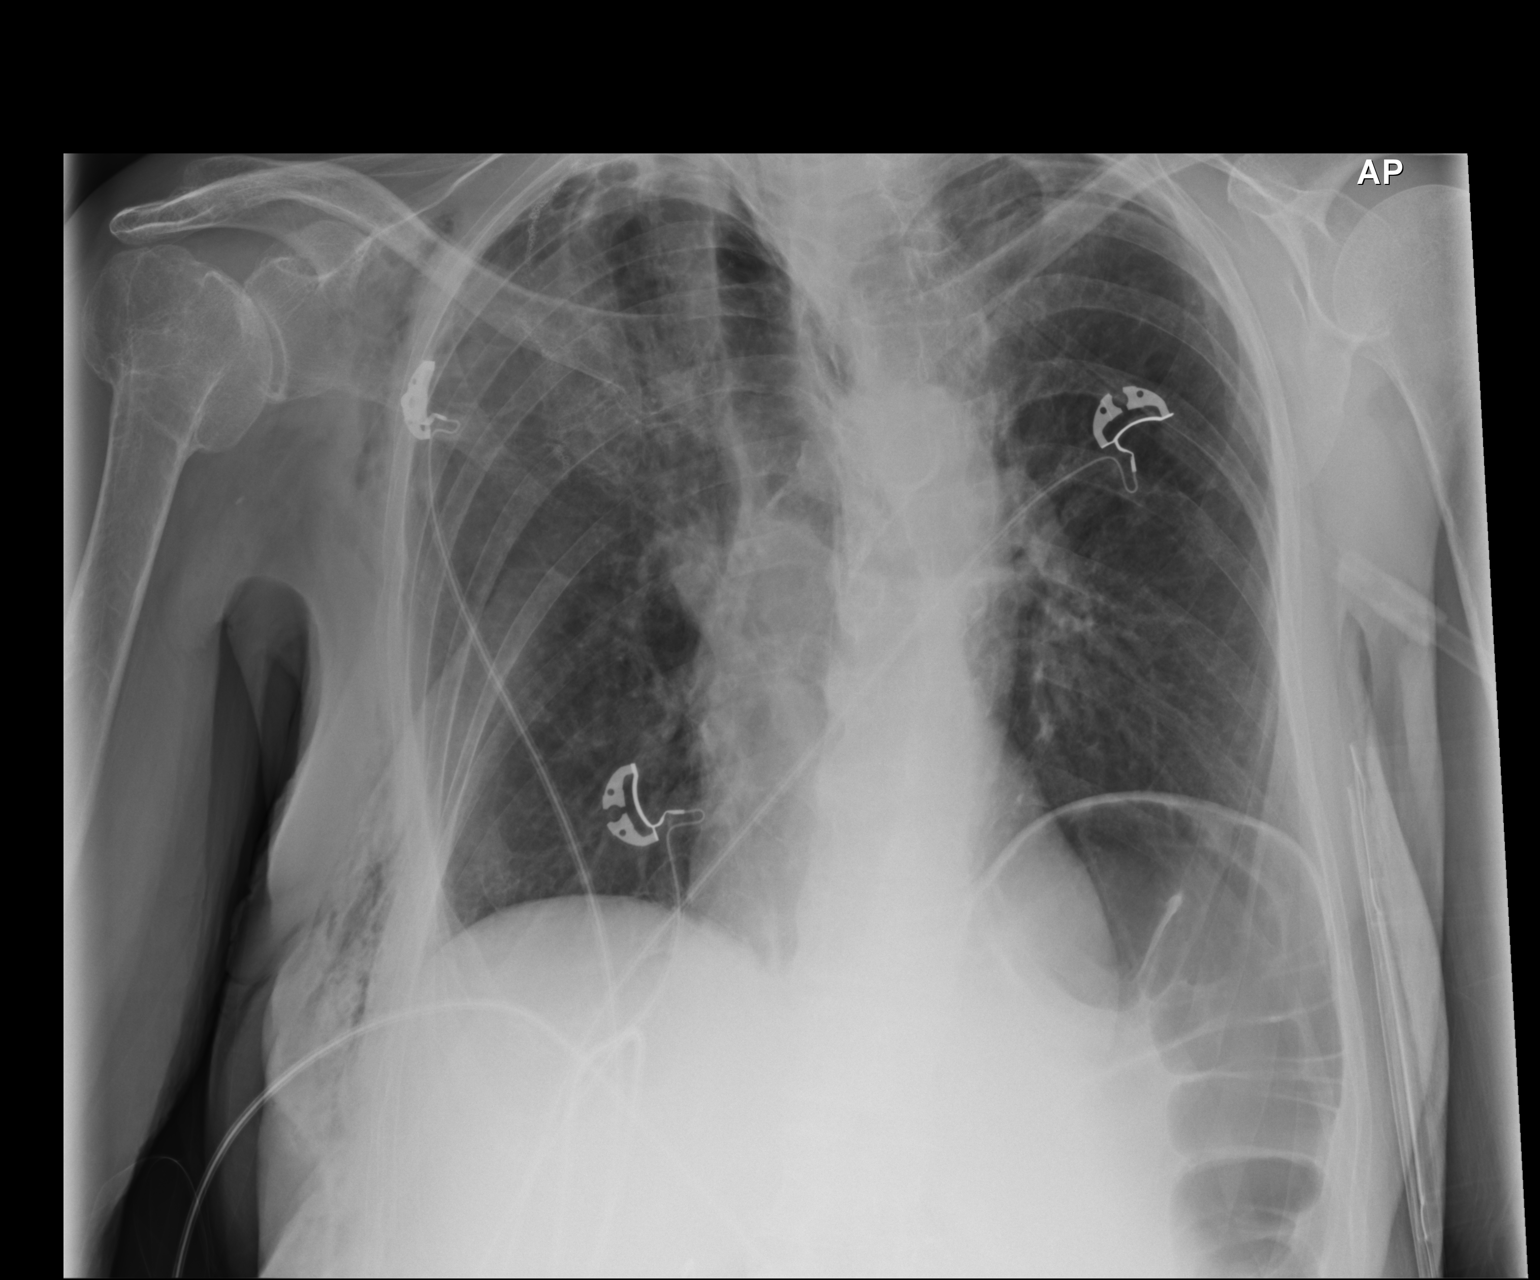

[1 of 1 positions shown; findings below may reference images not displayed]

FINDINGS: Right chest tube has been removed. No pneumothorax is identified.
Subcutaneous emphysema remains in the right chest wall and axillary
region. Cardiomediastinal silhouette is unchanged. Scarring in the
right greater than left lung apices does not appear significantly
changed, with a staple line again seen in the right upper lung the.
No pleural effusion is seen. There is mild elevation of the left
hemidiaphragm with mild gaseous distension of the underlying splenic
flexure. Chronic proximal right humerus fracture is noted.
IMPRESSION: Interval right chest tube removal.  No pneumothorax identified.

## 2017-01-30 IMAGING — DX DG CHEST 2V
2 series · 2 of 2 positions shown · non-contrast
Comparison: 09/18/2014

CLINICAL DATA: Emphysema.  Status post right chest tube removal.

EXAM:
CHEST  2 VIEW

[chest pa]
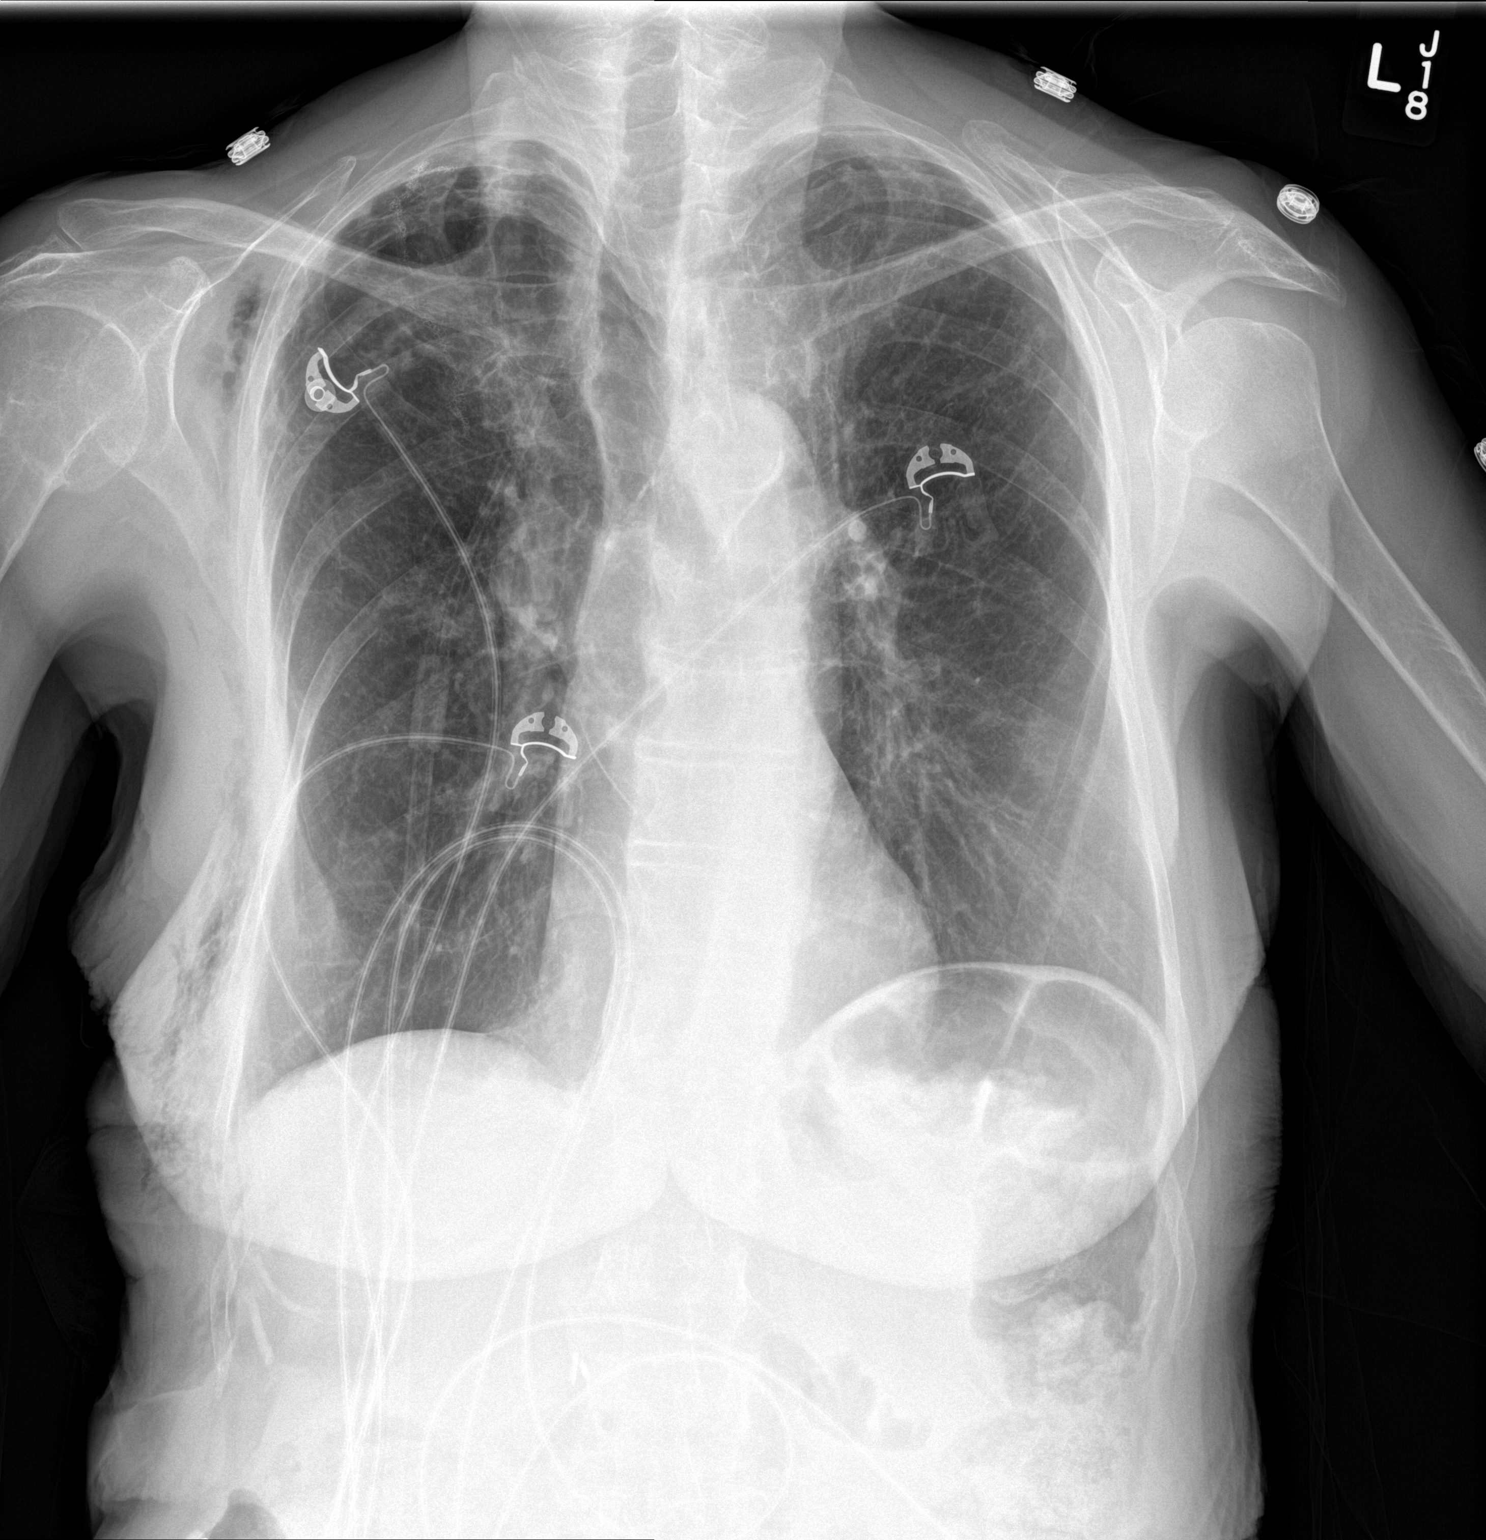

[chest lat]
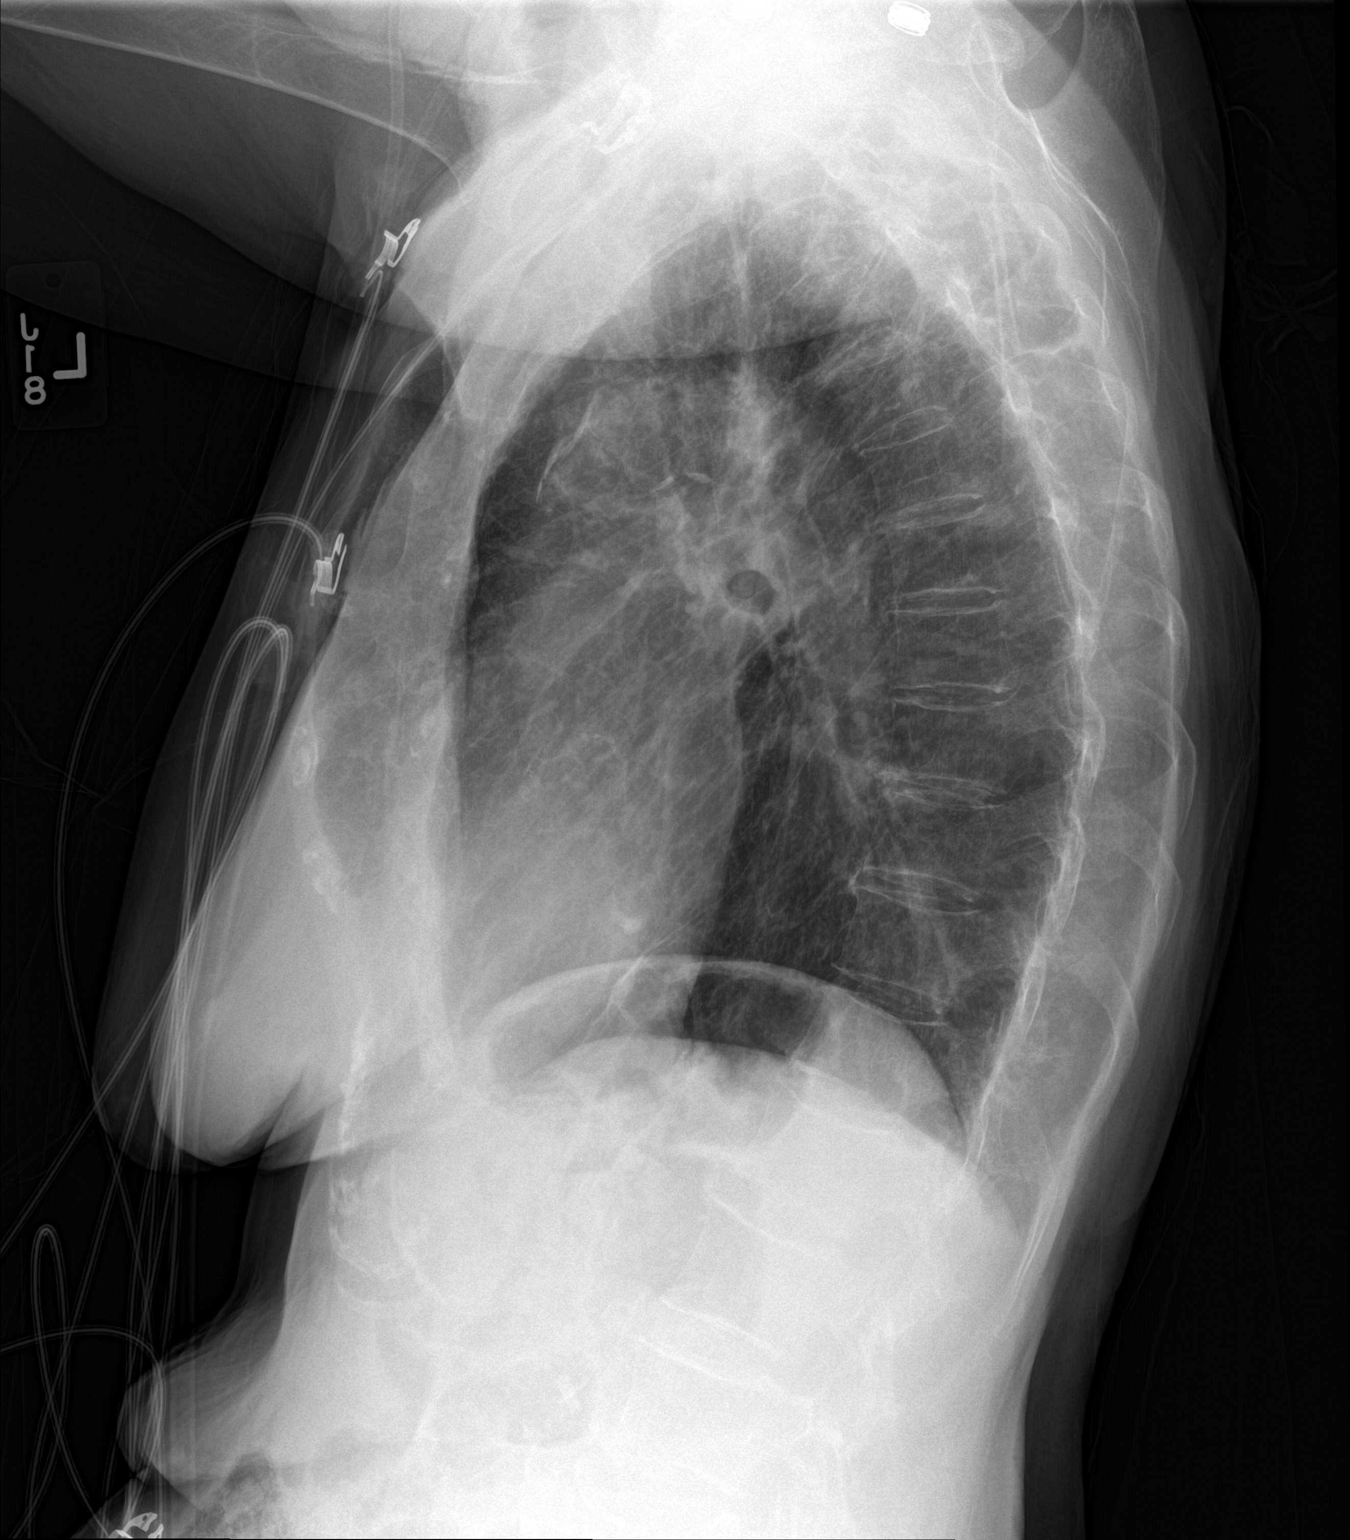

[2 of 2 positions shown; findings below may reference images not displayed]

FINDINGS: Subcutaneous emphysema again seen in the right chest wall, however
no pneumothorax visualized. Pulmonary emphysema again demonstrated.
The surgical staples again seen in the right lung apex. No evidence
of acute infiltrate or edema. Heart size is within normal limits.
IMPRESSION: Pulmonary emphysema.  No pneumothorax identified.

## 2017-02-10 IMAGING — CR DG CHEST 2V
2 series · 2 of 2 positions shown · non-contrast
Comparison: PA and lateral chest x-ray September 19, 2014

CLINICAL DATA: History of Previous pneumothorax treated with chest
tube, emphysema, shortness of breath today

EXAM:
CHEST  2 VIEW

[w chest lat]
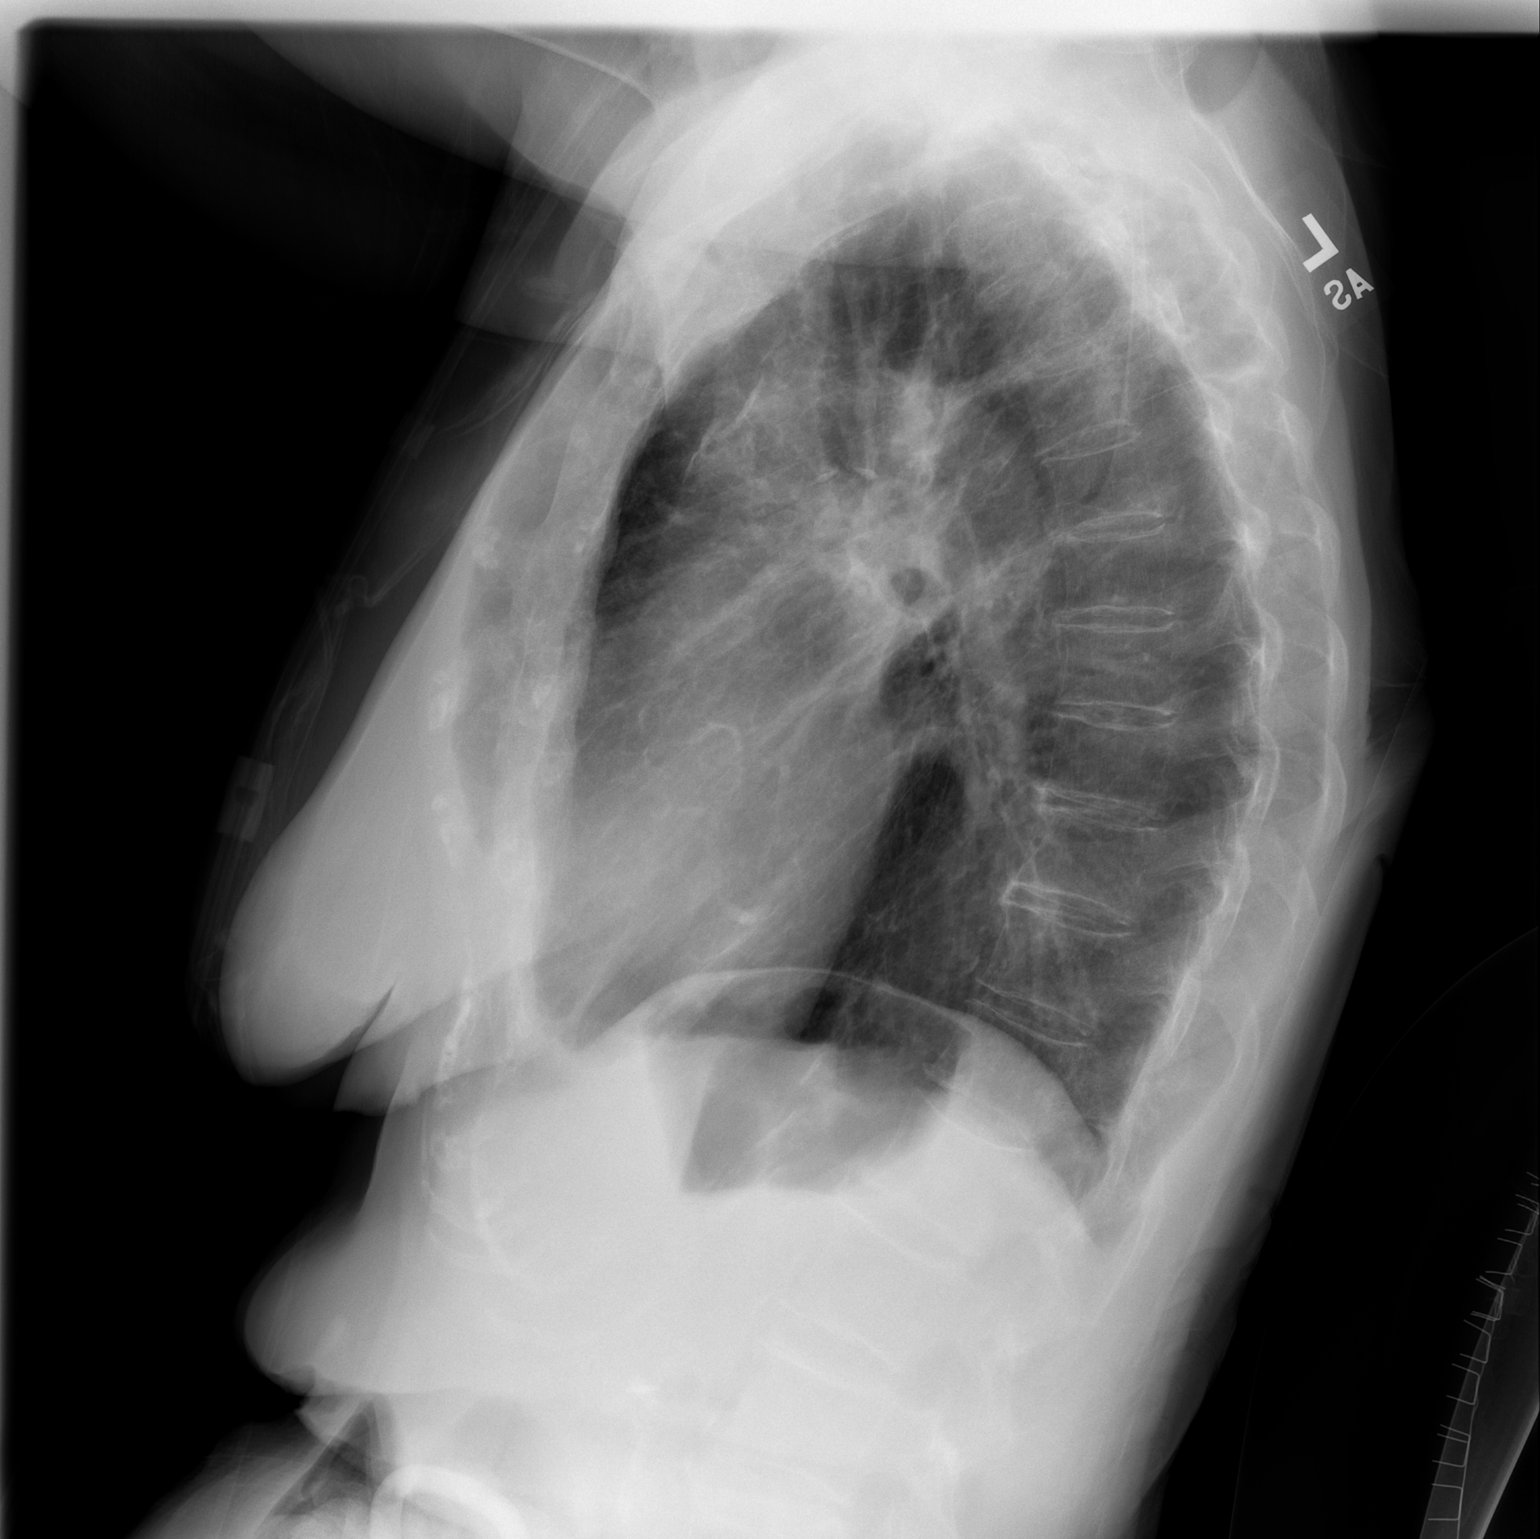

[w chest ap]
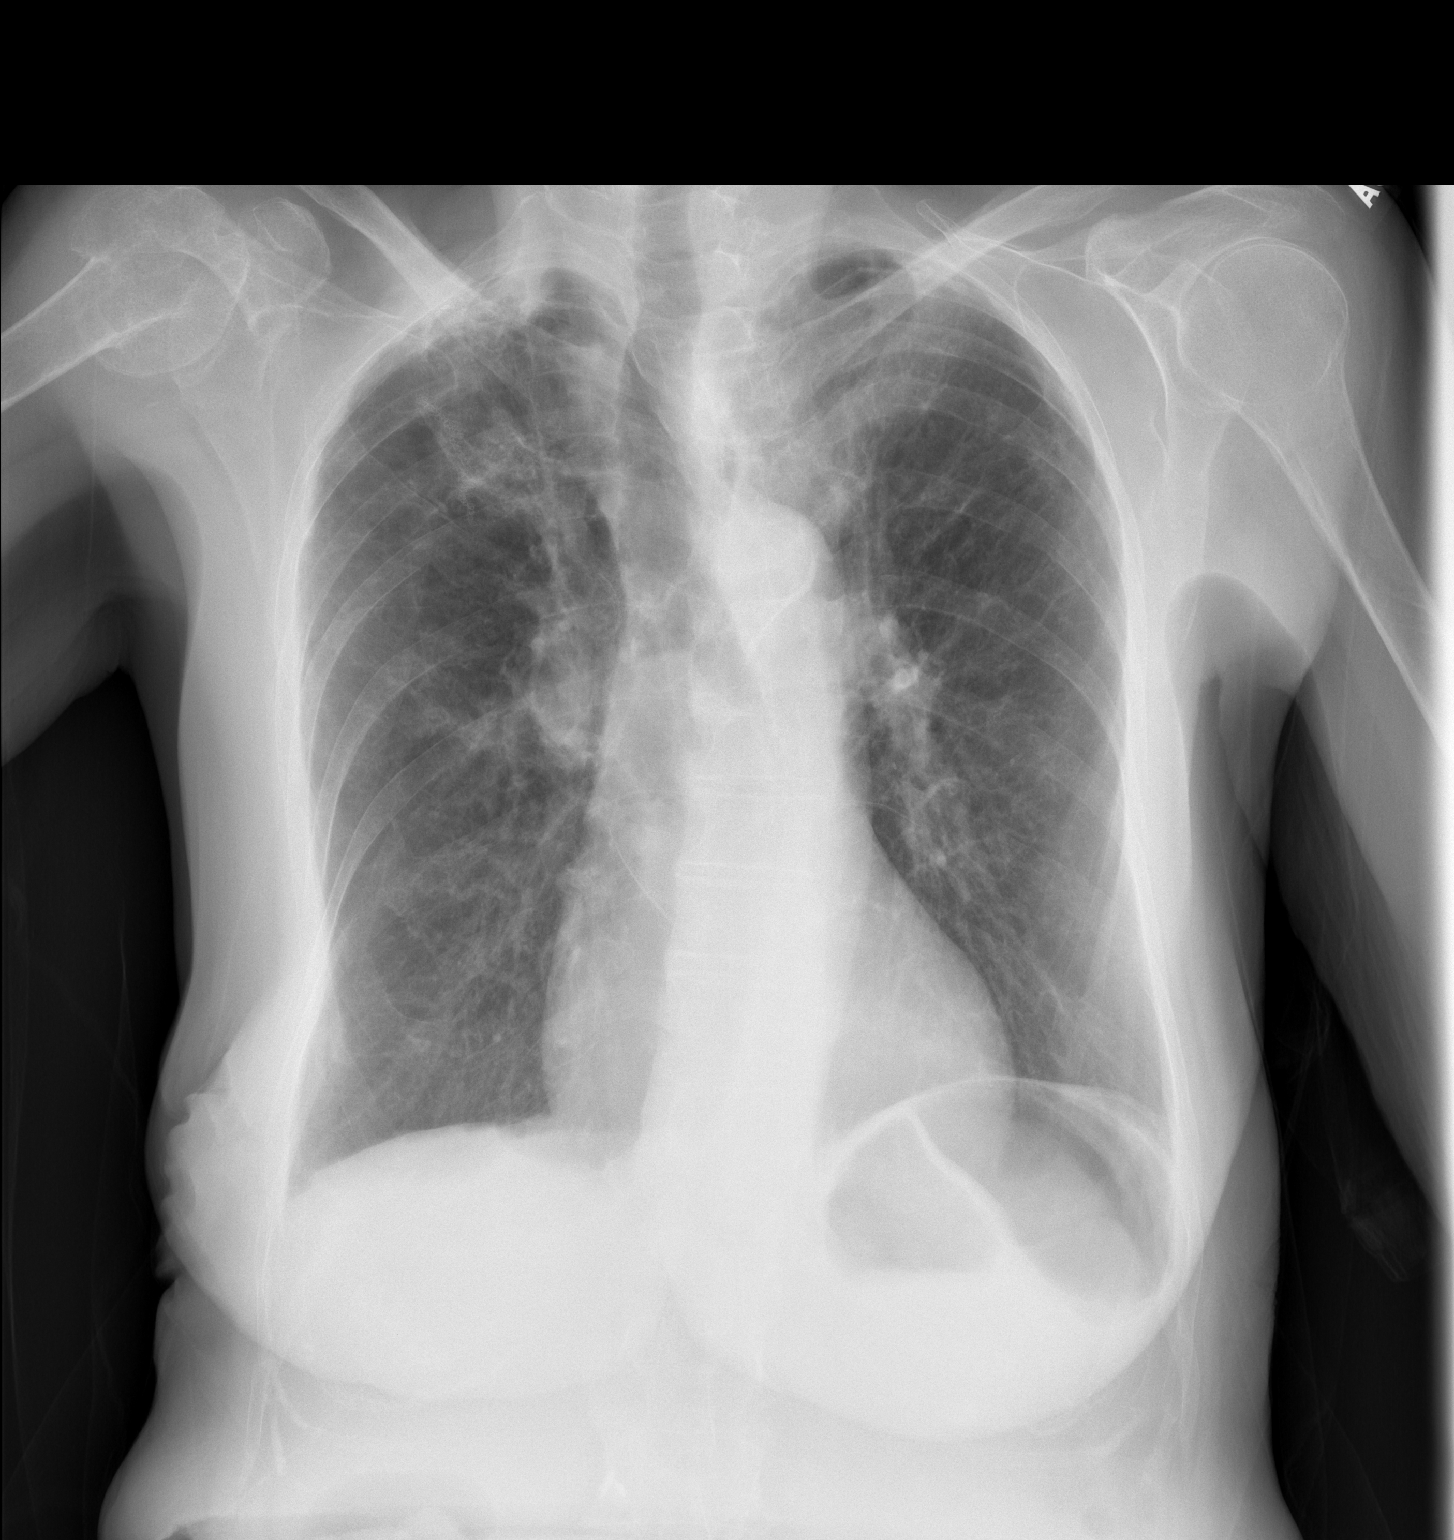

[2 of 2 positions shown; findings below may reference images not displayed]

FINDINGS: The lungs remain hyperinflated. The interstitial markings remain
coarse in the right perihilar region and in the apex. There is no
pneumothorax. The subcutaneous emphysema on the right has resolved.
There is no pleural effusion. The left lung is clear. The heart is
normal in size. There is stable levocurvature centered in the mid
thoracic spine. The bony thorax exhibits no acute abnormality. There
is chronic deformity of the proximal right humerus.
IMPRESSION: Stable emphysematous changes bilaterally. There is no recurrent
pneumothorax.

## 2017-02-19 IMAGING — XA IR IMAGE GUIDED DRAINAGE BY PERCUTANEOUS CATHETER
1 series · 2 of 2 positions shown · non-contrast
Comparison: none

CLINICAL DATA: 74-year-old female with recurrent right-sided
pneumothorax. She has been referred for image guided chest tube
placement.

EXAM:
FLUOROSCOPIC GUIDED RIGHT CHEST TUBE PLACEMENT
FLUOROSCOPY TIME:  6 minutes 12 seconds
MEDICATIONS:
0.5 mg Versed, 12.5 mcg fentanyl
20 minutes sedation time
TECHNIQUE: The procedure, risks, benefits, and alternatives were explained to
the patient and the patient's family. Questions regarding the
procedure were encouraged and answered. The patient understands and
consents to the procedure.

[Series 1: run · 2 of 2 slices shown]
[im 1/2]
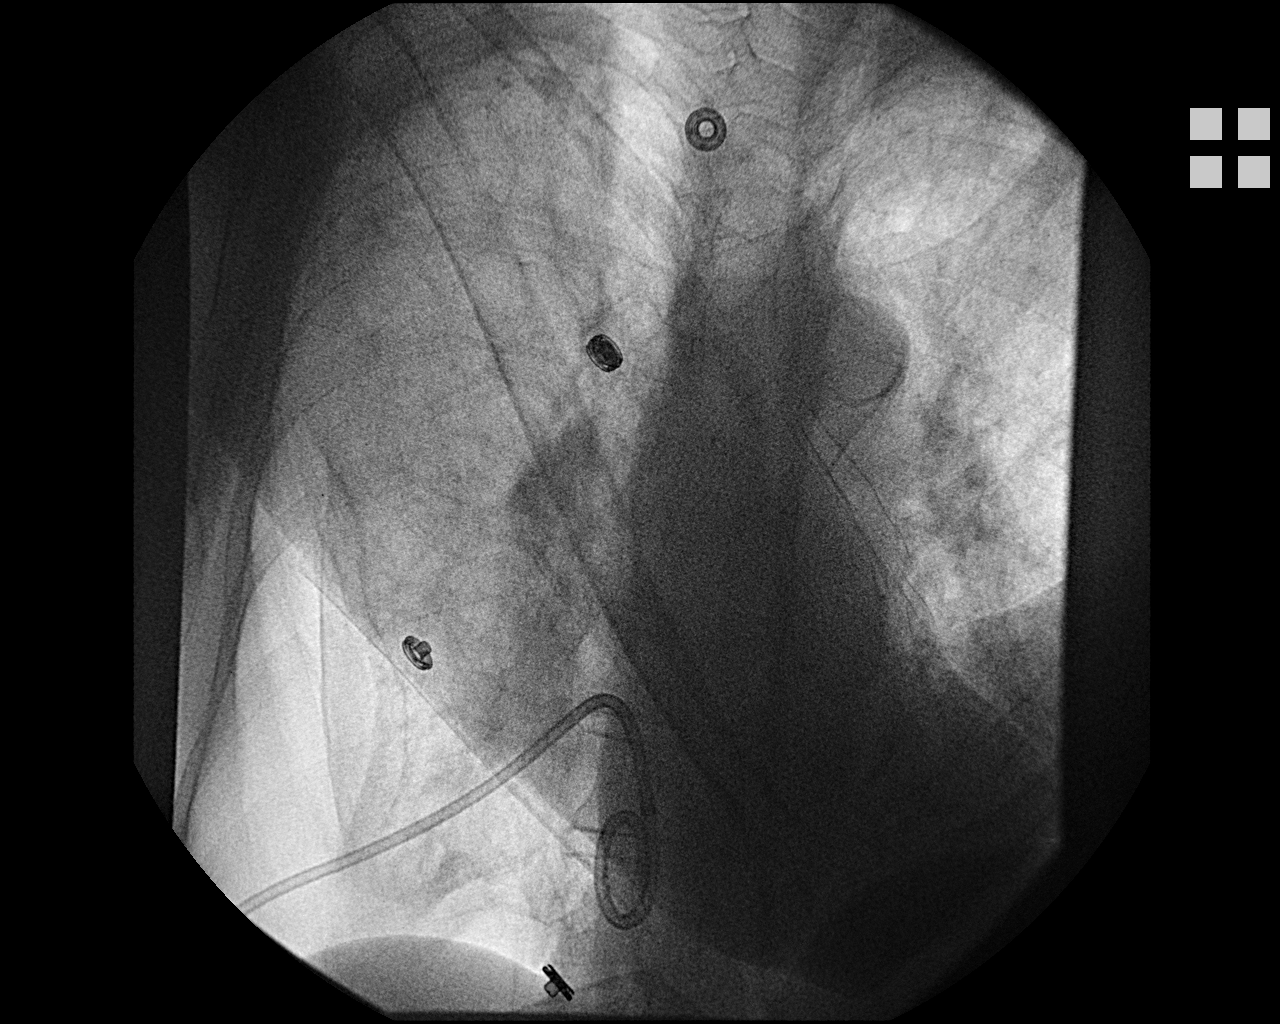
[im 2/2]
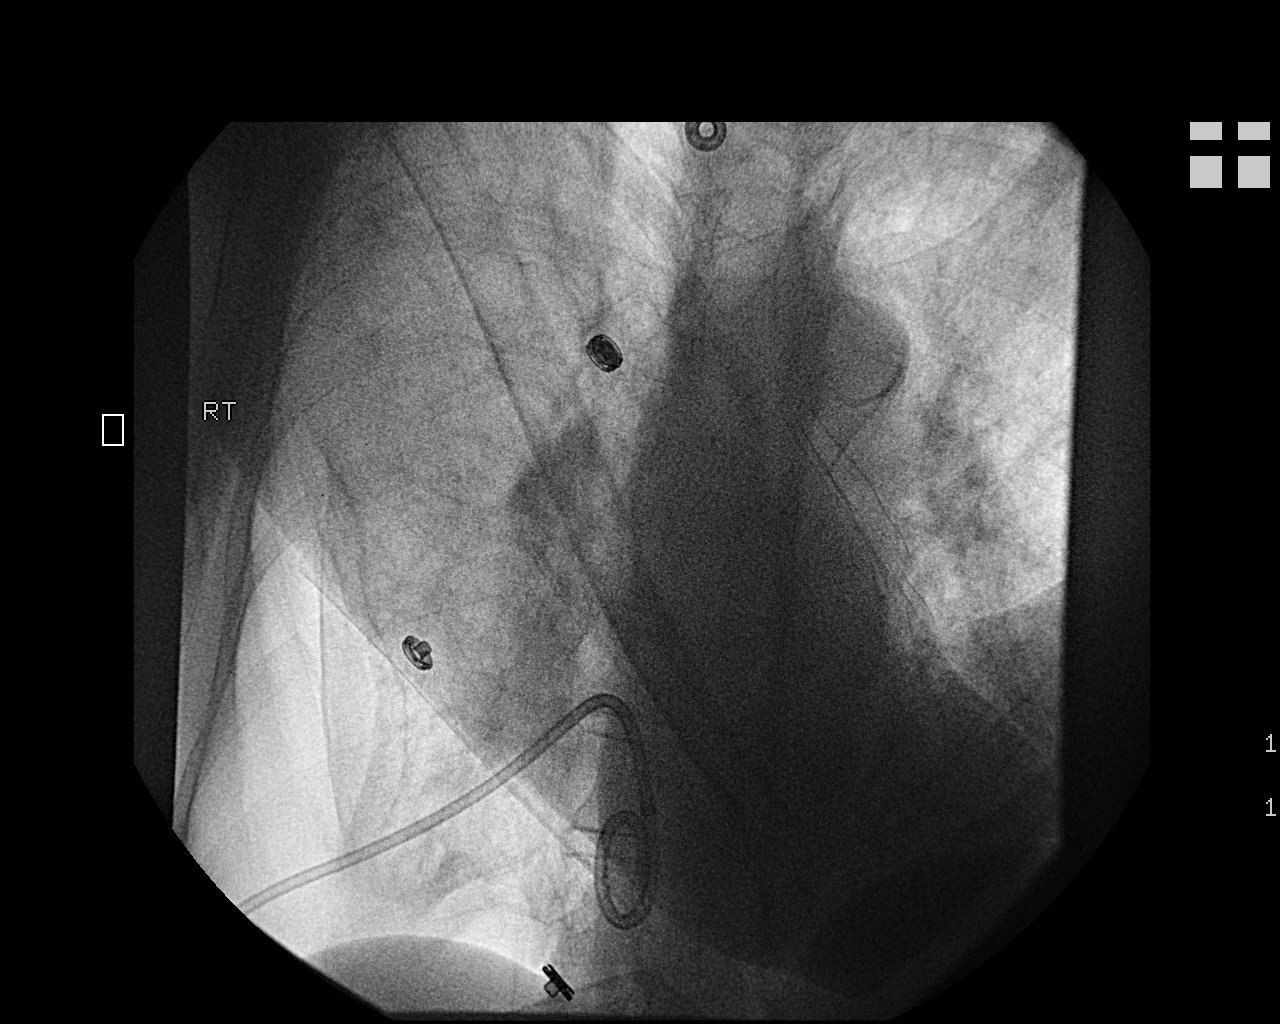

[2 of 2 positions shown; findings below may reference images not displayed]

Initial image demonstrates pneumothorax on the right, with the air
in the inferior chest.

The right lateral chest, in the mid axillary line at the level of
the nipple was prepped with Betadine in a sterile fashion, and a
sterile drape was applied covering the operative field. A sterile
gown and sterile gloves were used for the procedure. Local
anesthesia was provided with 1% Lidocaine.

1% lidocaine was used to anesthetize the skin and subcutaneous
tissues to the level of the pleural space, with air aspirated at the
level the pleura. A small stab incision was made with 11 blade
scalpel, and the Yueh needle was advanced into the pleural space.
The catheter was advanced off the needle when air was aspirated. An
035 wire was advanced into the pleural space, and then a 10 French
pigtail catheter was advanced over the wire into the pleural space.
Final position of the catheter was dependently within the gas at the
inferior aspect of the chest cavity.

Final image was stored.

Patient tolerated the procedure well and remained hemodynamically
stable throughout.

No complications were encountered and no significant blood loss was
encounter
IMPRESSION: Status post fluoroscopic placement of right-sided thoracostomy tube.

PLAN:
Thoracostomy tube will be placed to atrium drainage, with continuous
aspiration overnight to 20 cm water pressure.

## 2017-02-19 IMAGING — CR DG CHEST 2V
2 series · 2 of 2 positions shown · non-contrast
Comparison: PA and lateral chest x-ray September 30, 2014

CLINICAL DATA: Mid chest pain shortness breath and cough since last
night, history of COPD and asthma

EXAM:
CHEST  2 VIEW

[chest lat]
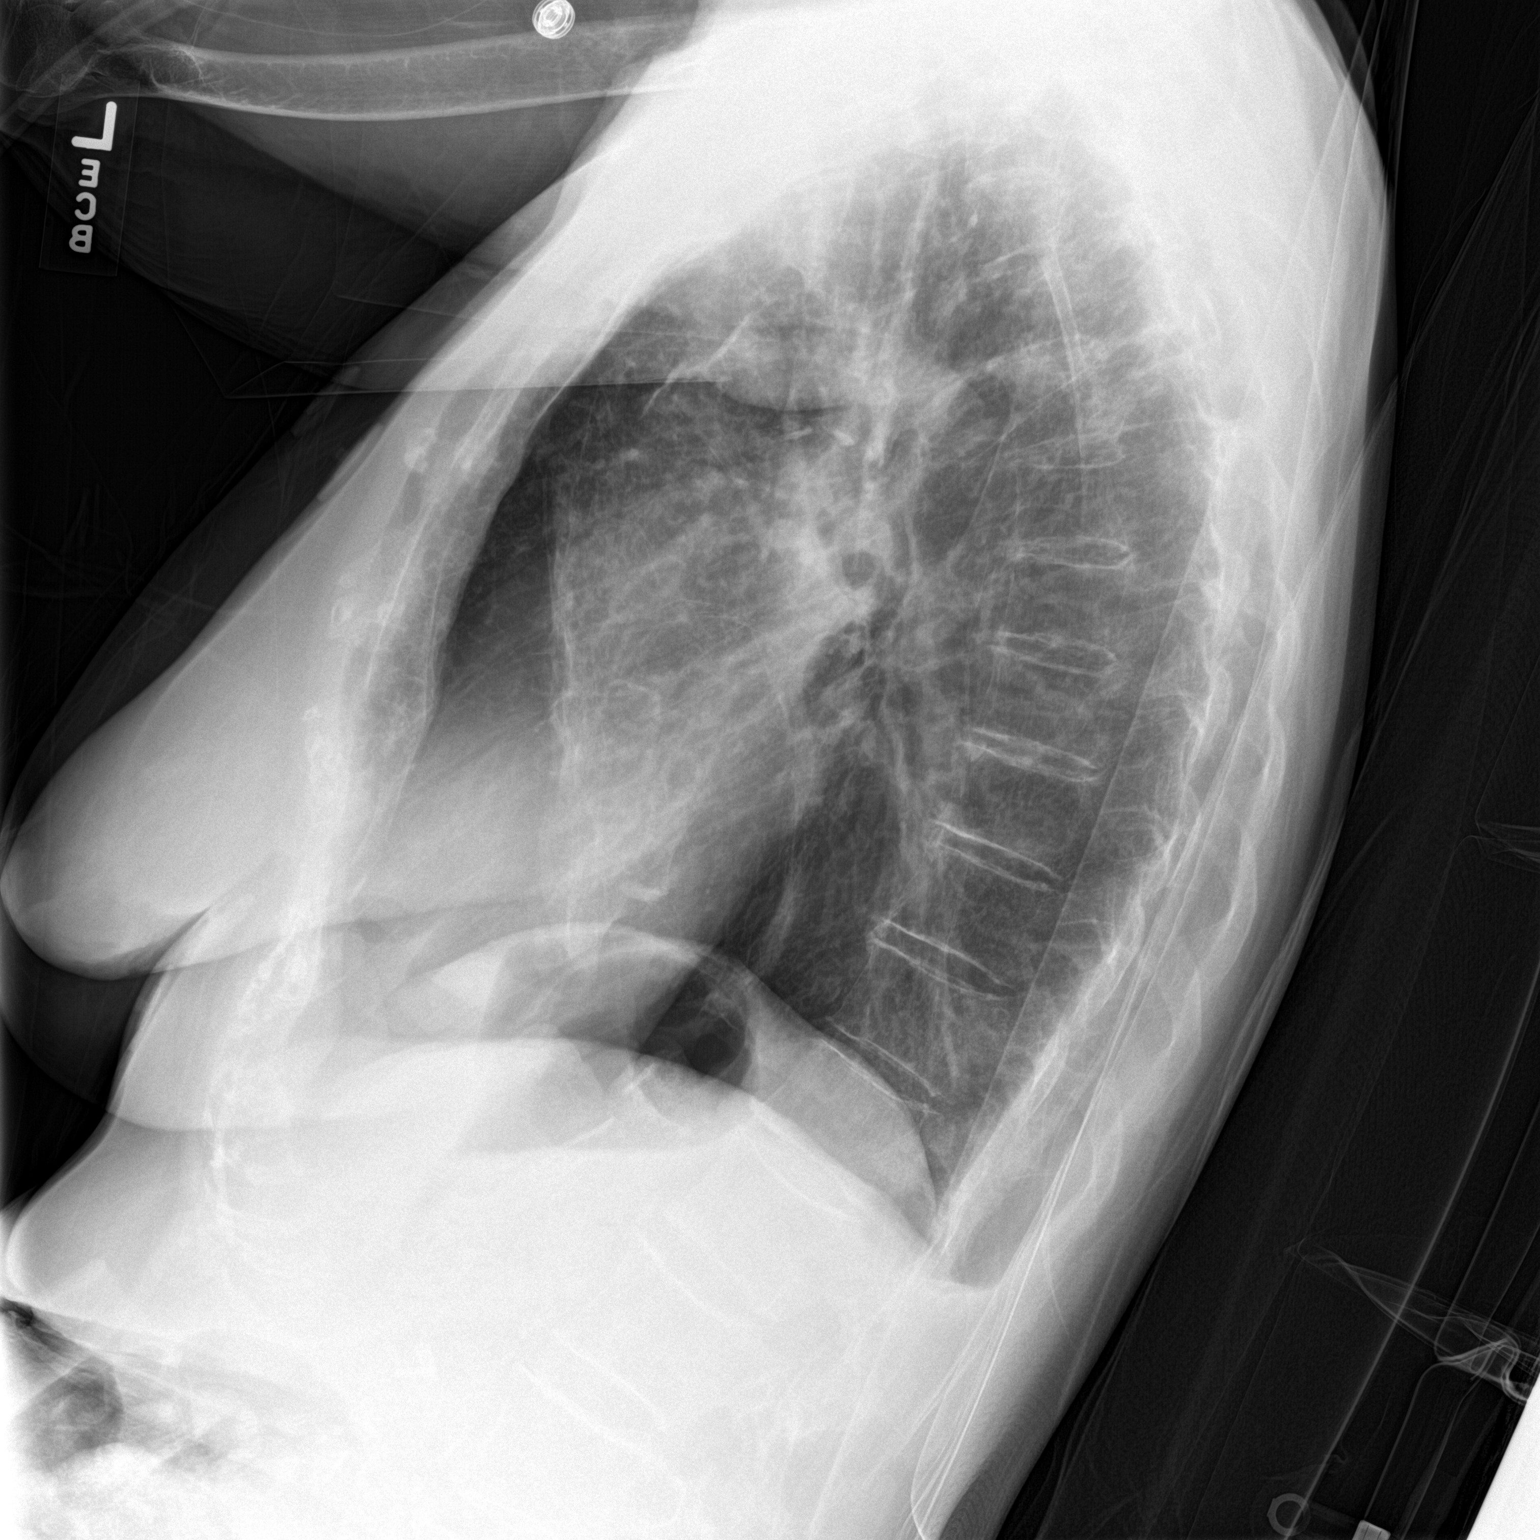

[chest ap]
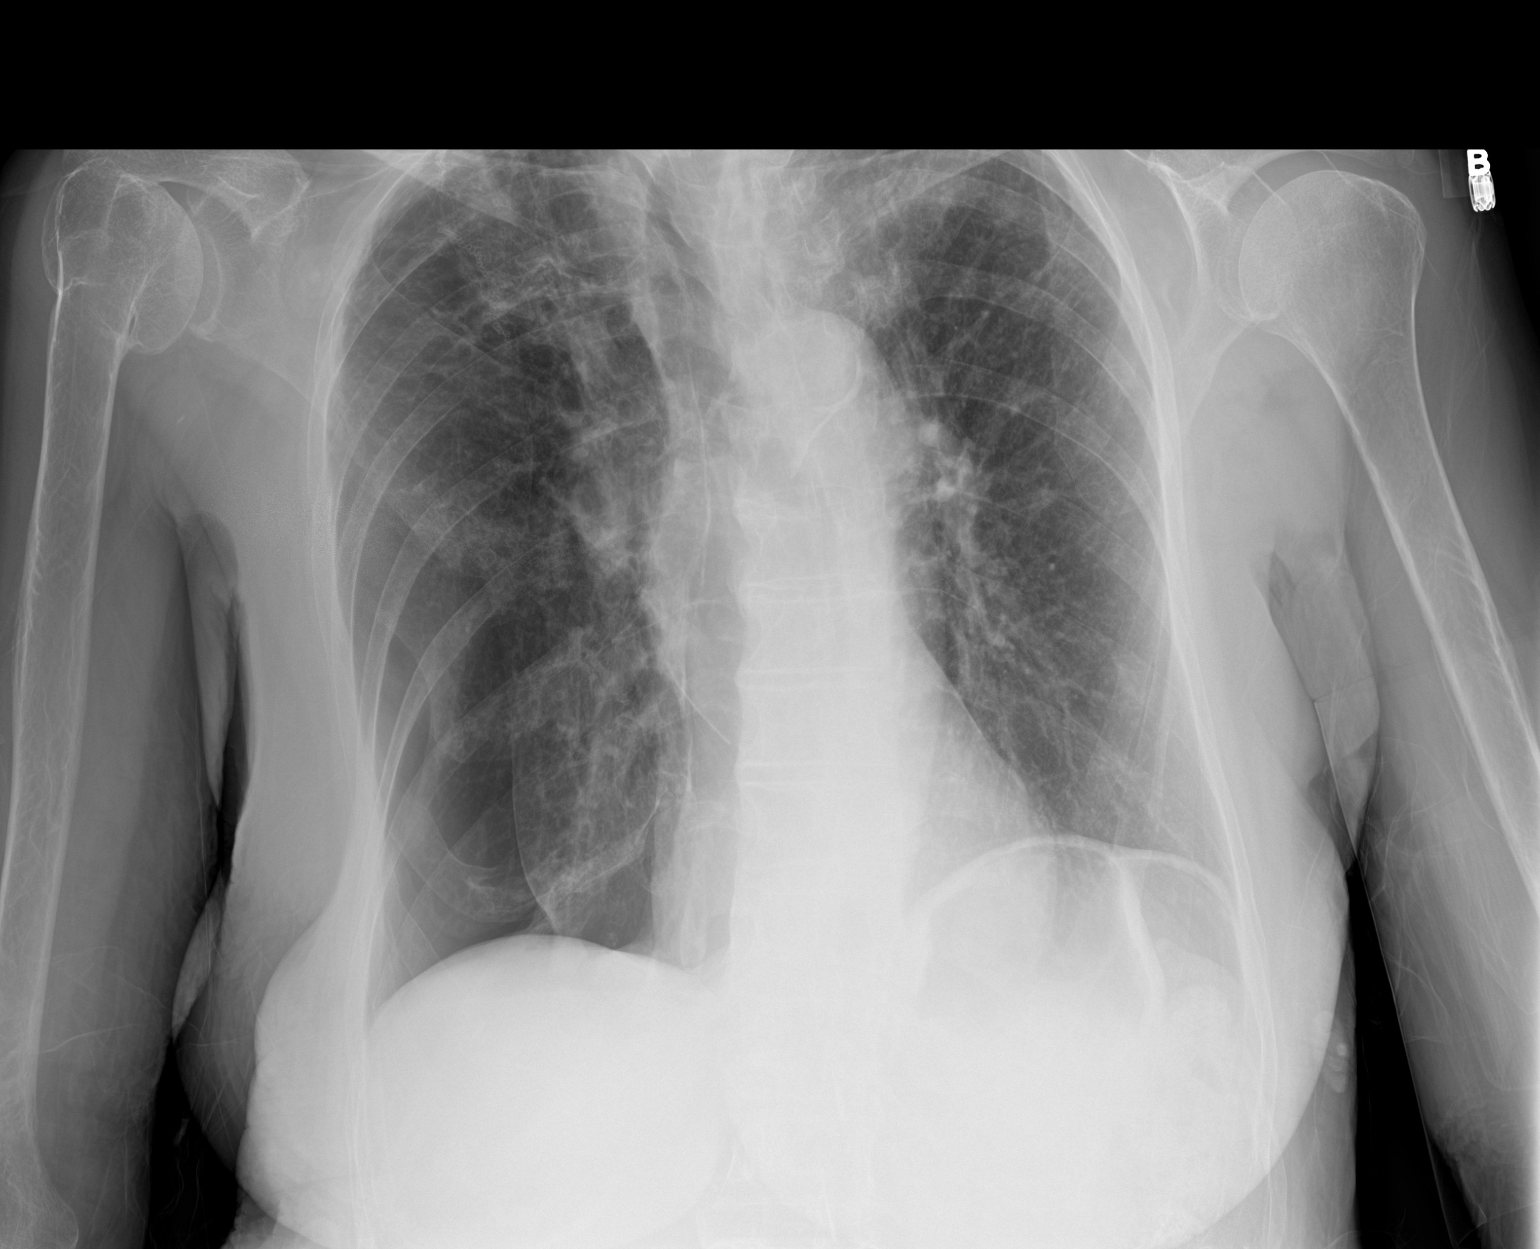

[2 of 2 positions shown; findings below may reference images not displayed]

FINDINGS: There is a 40% right mid to lower pneumothorax. No significant
apical component is observed. There is no mediastinal shift. The
left lung is well-expanded. The aerated portions of both lungs
exhibit mild interstitial prominence. There is no alveolar
infiltrate there is a tiny right pleural effusion. No parenchymal
masses are observed. The heart is normal in size. The pulmonary
vascularity is not engorged. There is old deformity of the right
humeral head and neck. The thoracic vertebral bodies are preserved
in height.
IMPRESSION: 1. There is approximately 40% right-sided pneumothorax. There is a
trace right pleural effusion. There is underlying COPD and mild
pulmonary fibrotic change and chronic right apical scarring.
2. There is no CHF.
3. These results were called by telephone at the time of
interpretation on 10/09/2014 at [DATE] to Dr. Gake, who
verbally acknowledged these results.

## 2017-02-20 IMAGING — CR DG CHEST 1V PORT
1 series · 1 of 1 positions shown · non-contrast
Comparison: October 09, 2014

CLINICAL DATA: Reason pneumothorax on right.

EXAM:
PORTABLE CHEST - 1 VIEW

[AP]
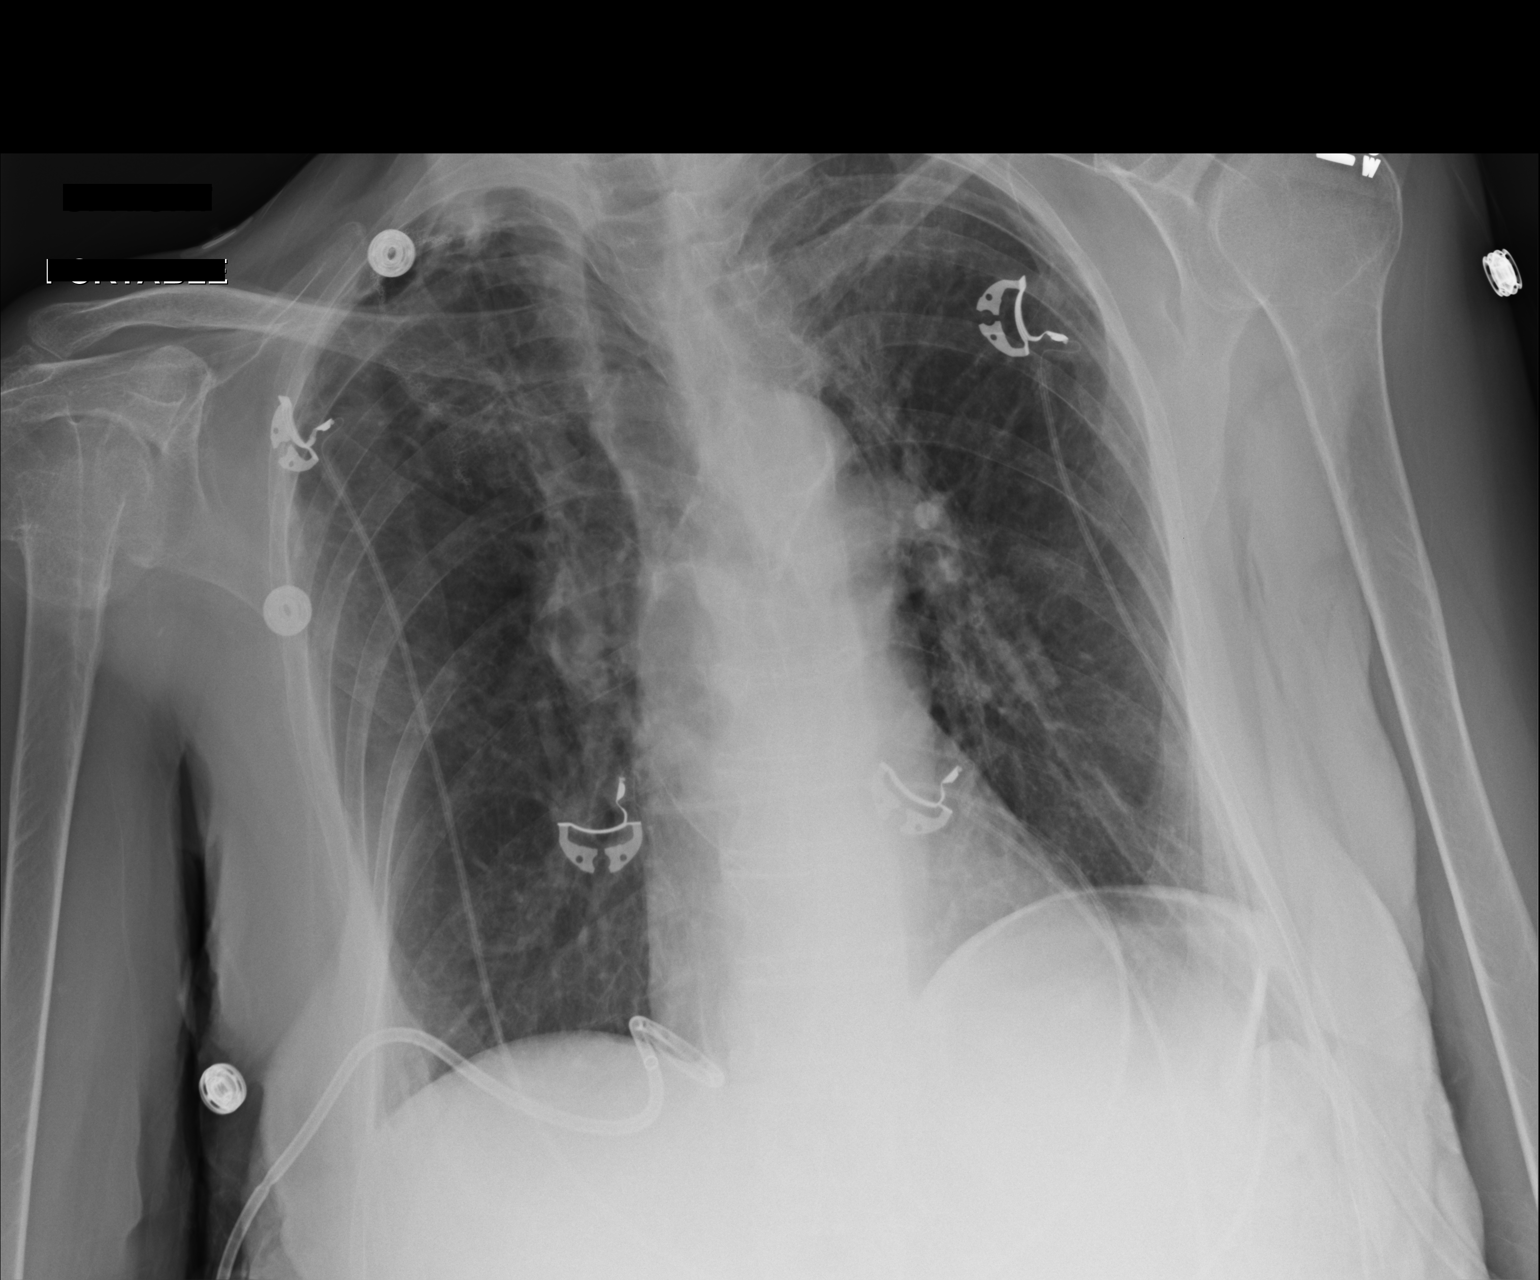

[1 of 1 positions shown; findings below may reference images not displayed]

FINDINGS: There is a chest catheter on the right inferiorly. There has been
interval resolution of pneumothorax on the right. There is
postoperative change in the right apex with scarring. There is no
edema or consolidation. Heart size and pulmonary vascularity are
normal. No adenopathy. There is evidence of old trauma involving the
proximal right humerus with remodeling, stable.
IMPRESSION: Resolution of pneumothorax following right chest catheter placement.
Stable scarring and postoperative change right apex. No edema or
consolidation.

## 2017-02-21 IMAGING — CR DG CHEST 1V PORT
1 series · 1 of 1 positions shown · non-contrast
Comparison: October 10, 2014

CLINICAL DATA: Chest tube placement for pneumothorax

EXAM:
PORTABLE CHEST - 1 VIEW

[AP]
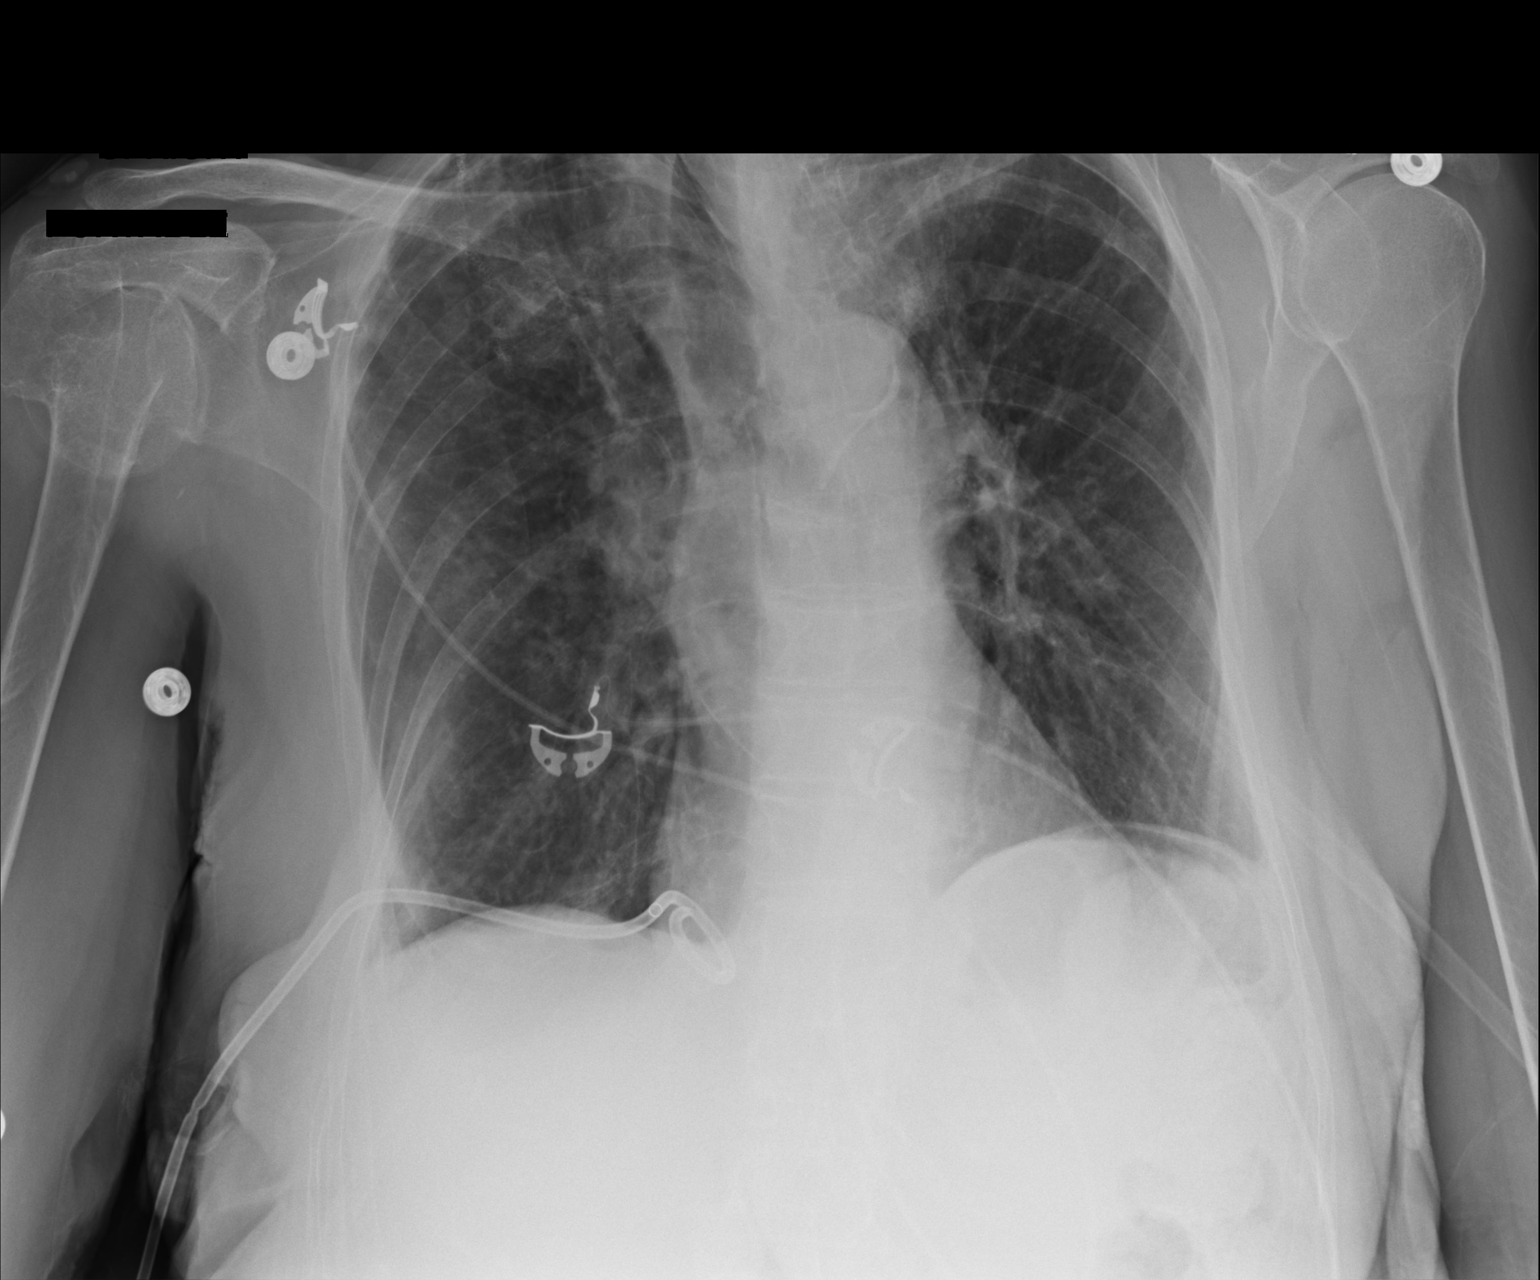

[1 of 1 positions shown; findings below may reference images not displayed]

FINDINGS: The chest catheter remains at the right base. There is no
demonstrable pneumothorax. There is postoperative change and
scarring in the right apex. Elsewhere lungs are clear. Heart size
and pulmonary vascularity are normal. No adenopathy. There is
evidence of old trauma involving the proximal right humerus, stable.
IMPRESSION: No pneumothorax. Scarring and postoperative change right apex. No
edema or consolidation. No appreciable change compared to most
recent prior study.

## 2017-02-22 IMAGING — CR DG CHEST 1V PORT
1 series · 1 of 1 positions shown · non-contrast
Comparison: Radiographs 10/11/2014

CLINICAL DATA: RIGHT pneumothorax.

EXAM:
PORTABLE CHEST - 1 VIEW

[AP]
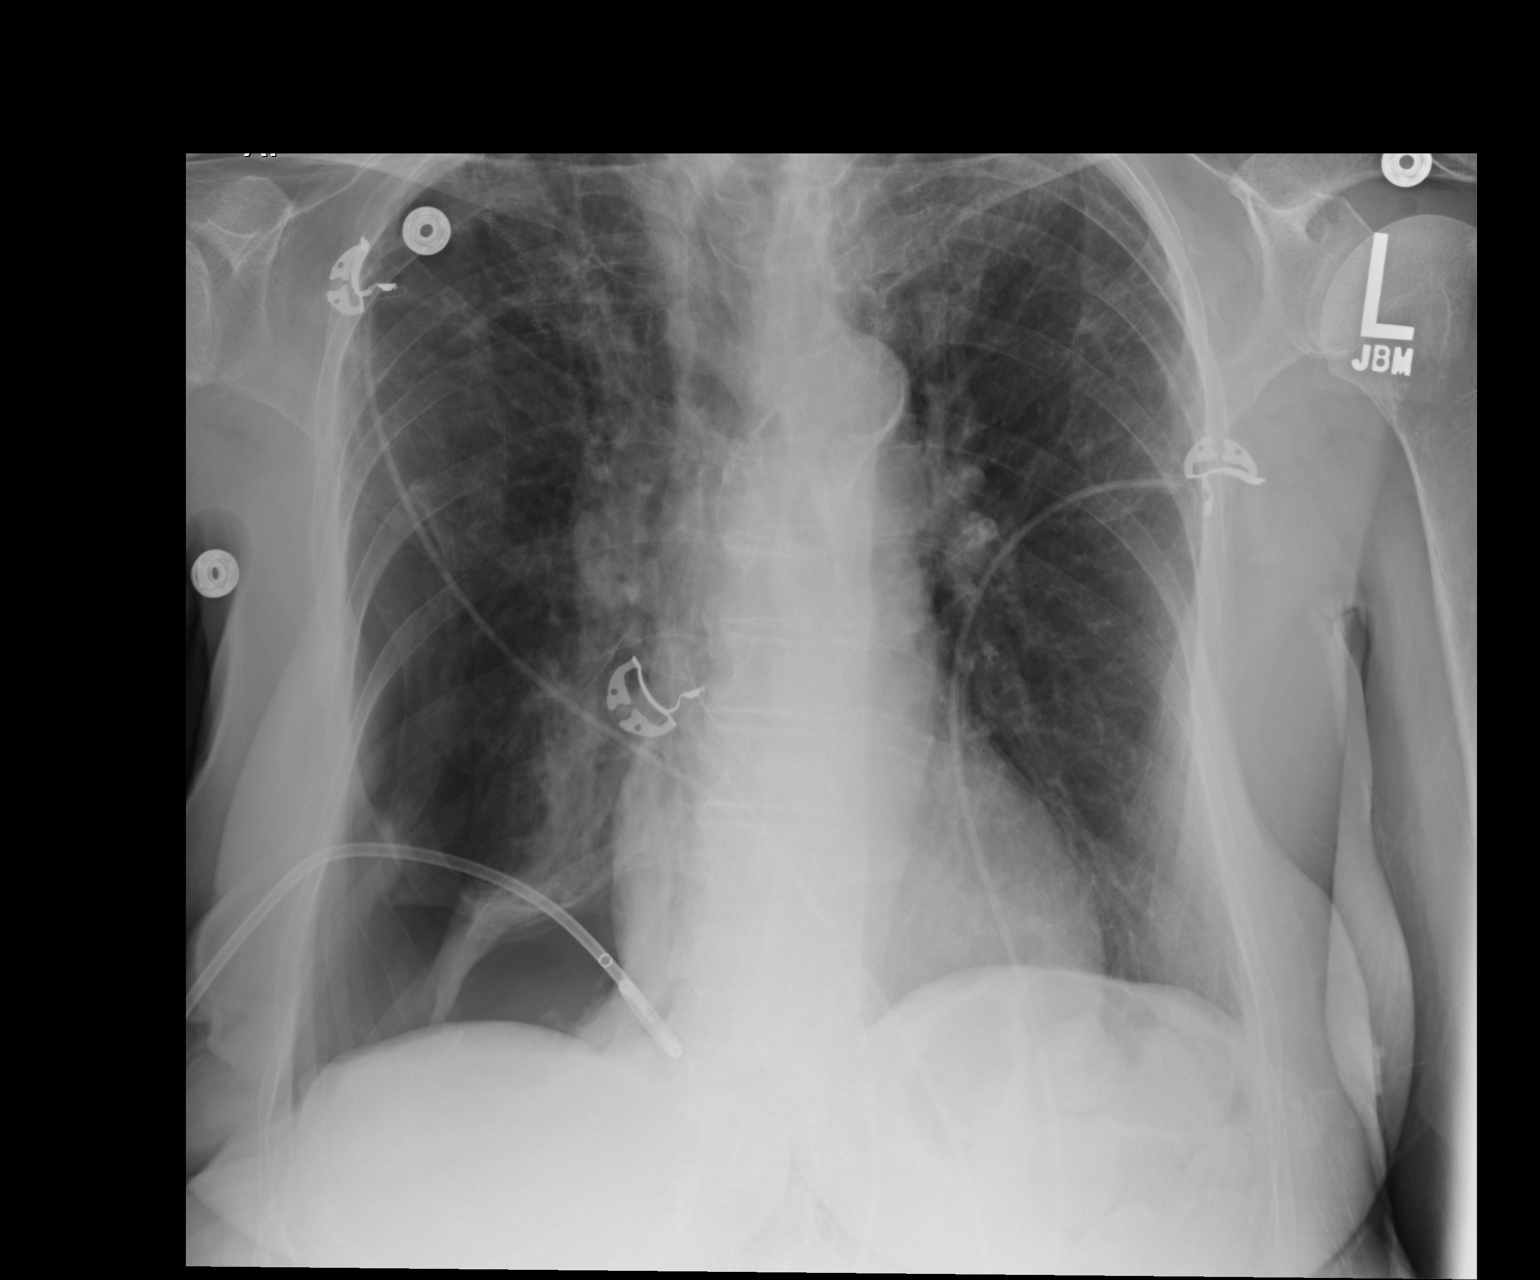

[1 of 1 positions shown; findings below may reference images not displayed]

FINDINGS: Normal cardiac silhouette. RIGHT pigtail catheter the RIGHT lung
base. There is interval increase in the subpulmonic pneumothorax at
the RIGHT lung base. Pneumothorax occupies approximately 15% of lung
volume. No mediastinal shift. Bilateral upper lobe pulmonary
scarring.
IMPRESSION: Increased subpulmonic pneumothorax at the RIGHT lung base. A small
bore chest tube in place.

These results will be called to the ordering clinician or
representative by the Radiologist Assistant, and communication
documented in the PACS or zVision Dashboard.

## 2017-02-23 IMAGING — CR DG CHEST 1V PORT
1 series · 1 of 1 positions shown · non-contrast
Comparison: 10/12/2014;

CLINICAL DATA: History of recurrent spontaneous pneumothorax, post
fluoroscopic guided right chest tube placement.

EXAM:
PORTABLE CHEST - 1 VIEW

[AP]
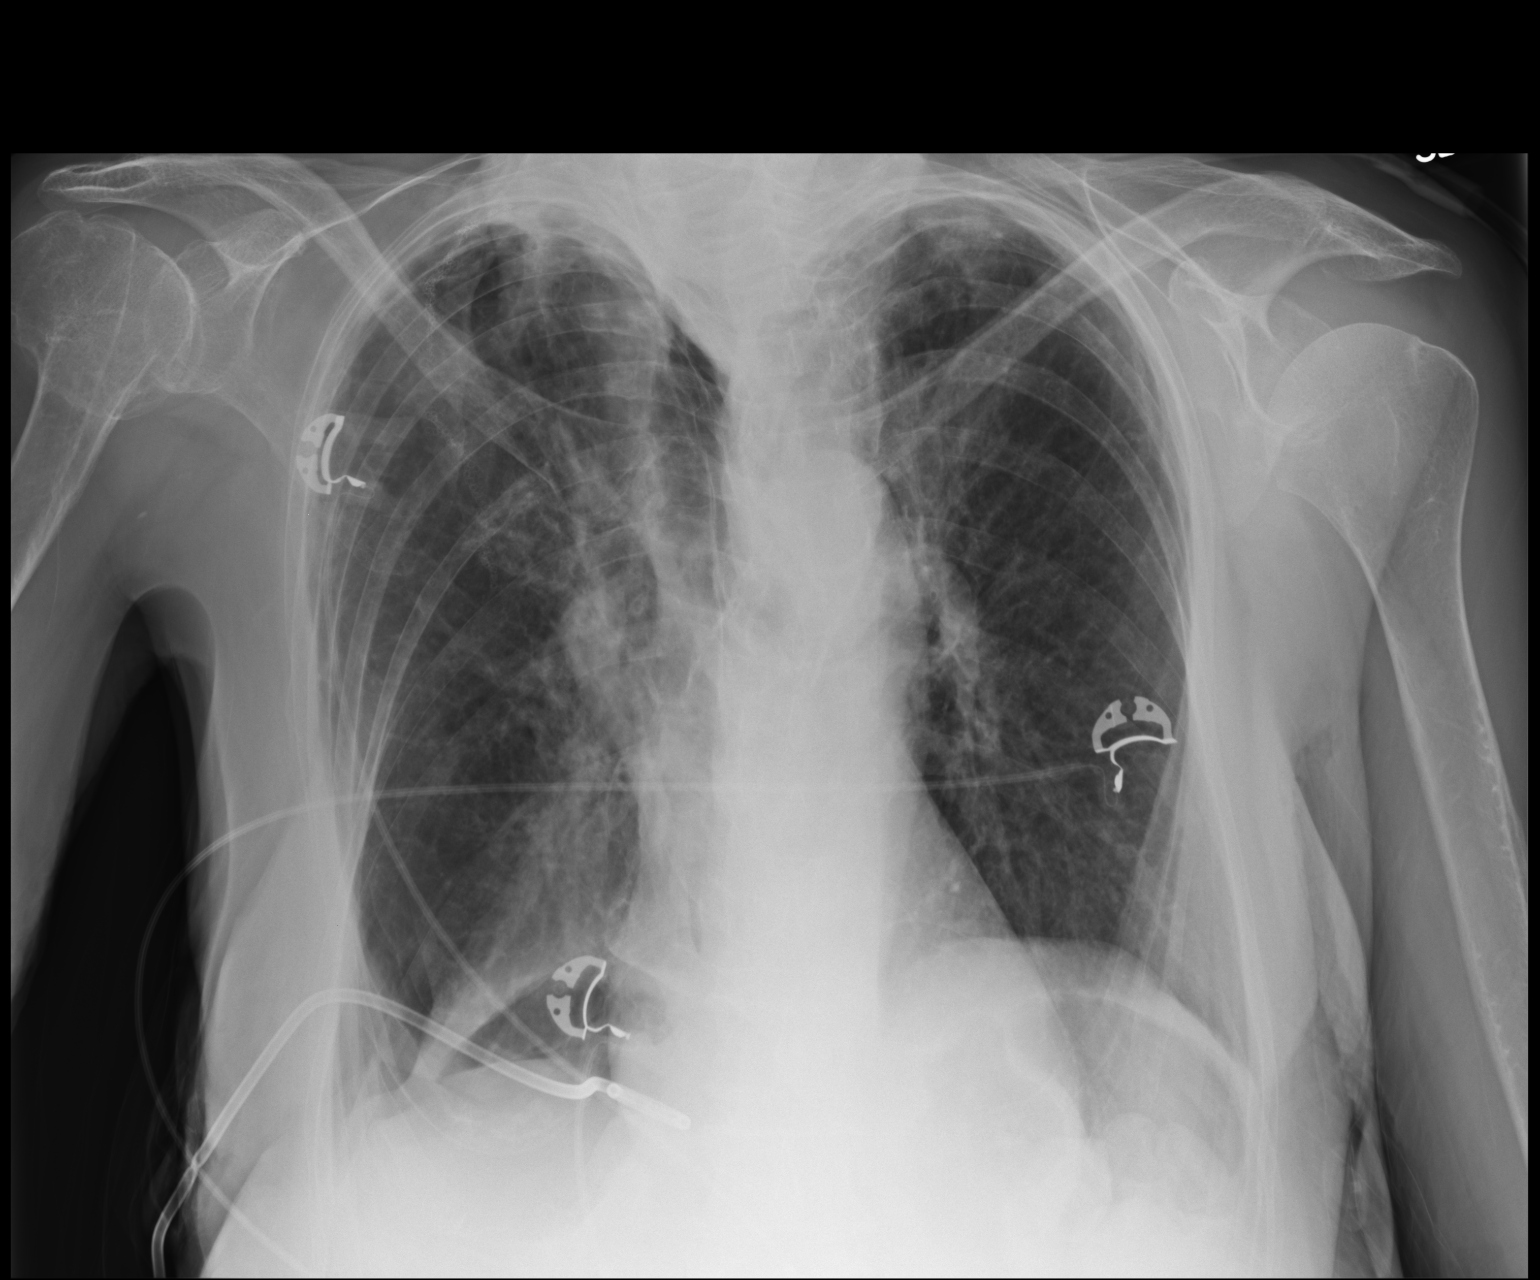

[1 of 1 positions shown; findings below may reference images not displayed]

10/11/2014; 10/10/2014; 10/09/2014;
fluoroscopic guided right-sided chest tube placement -10/09/2014
FINDINGS: Grossly unchanged cardiac silhouette and mediastinal contours given
kyphotic projection. Stable positioning of support apparatus.
Interval reduction in persistent small right basilar pneumothorax.
The lungs remain hyperexpanded. Grossly unchanged slightly
asymmetric biapical pleural parenchymal thickening, right greater
than left. Unchanged right basilar linear heterogeneous opacities
favored to represent atelectasis. No new focal airspace opacities.
No evidence of edema. Unchanged bones including sequela of prior
impaction fracture involving the right humeral neck.
IMPRESSION: Stable position of support apparatus with interval reduction in
persistent small right basilar pneumothorax. Continued attention on
follow-up is recommended.

## 2017-02-24 IMAGING — CR DG CHEST 1V PORT
1 series · 1 of 1 positions shown · non-contrast
Comparison: 10/13/2014.

CLINICAL DATA: 74-year-old female with pneumothorax. Subsequent
encounter.

EXAM:
PORTABLE CHEST - 1 VIEW

[AP]
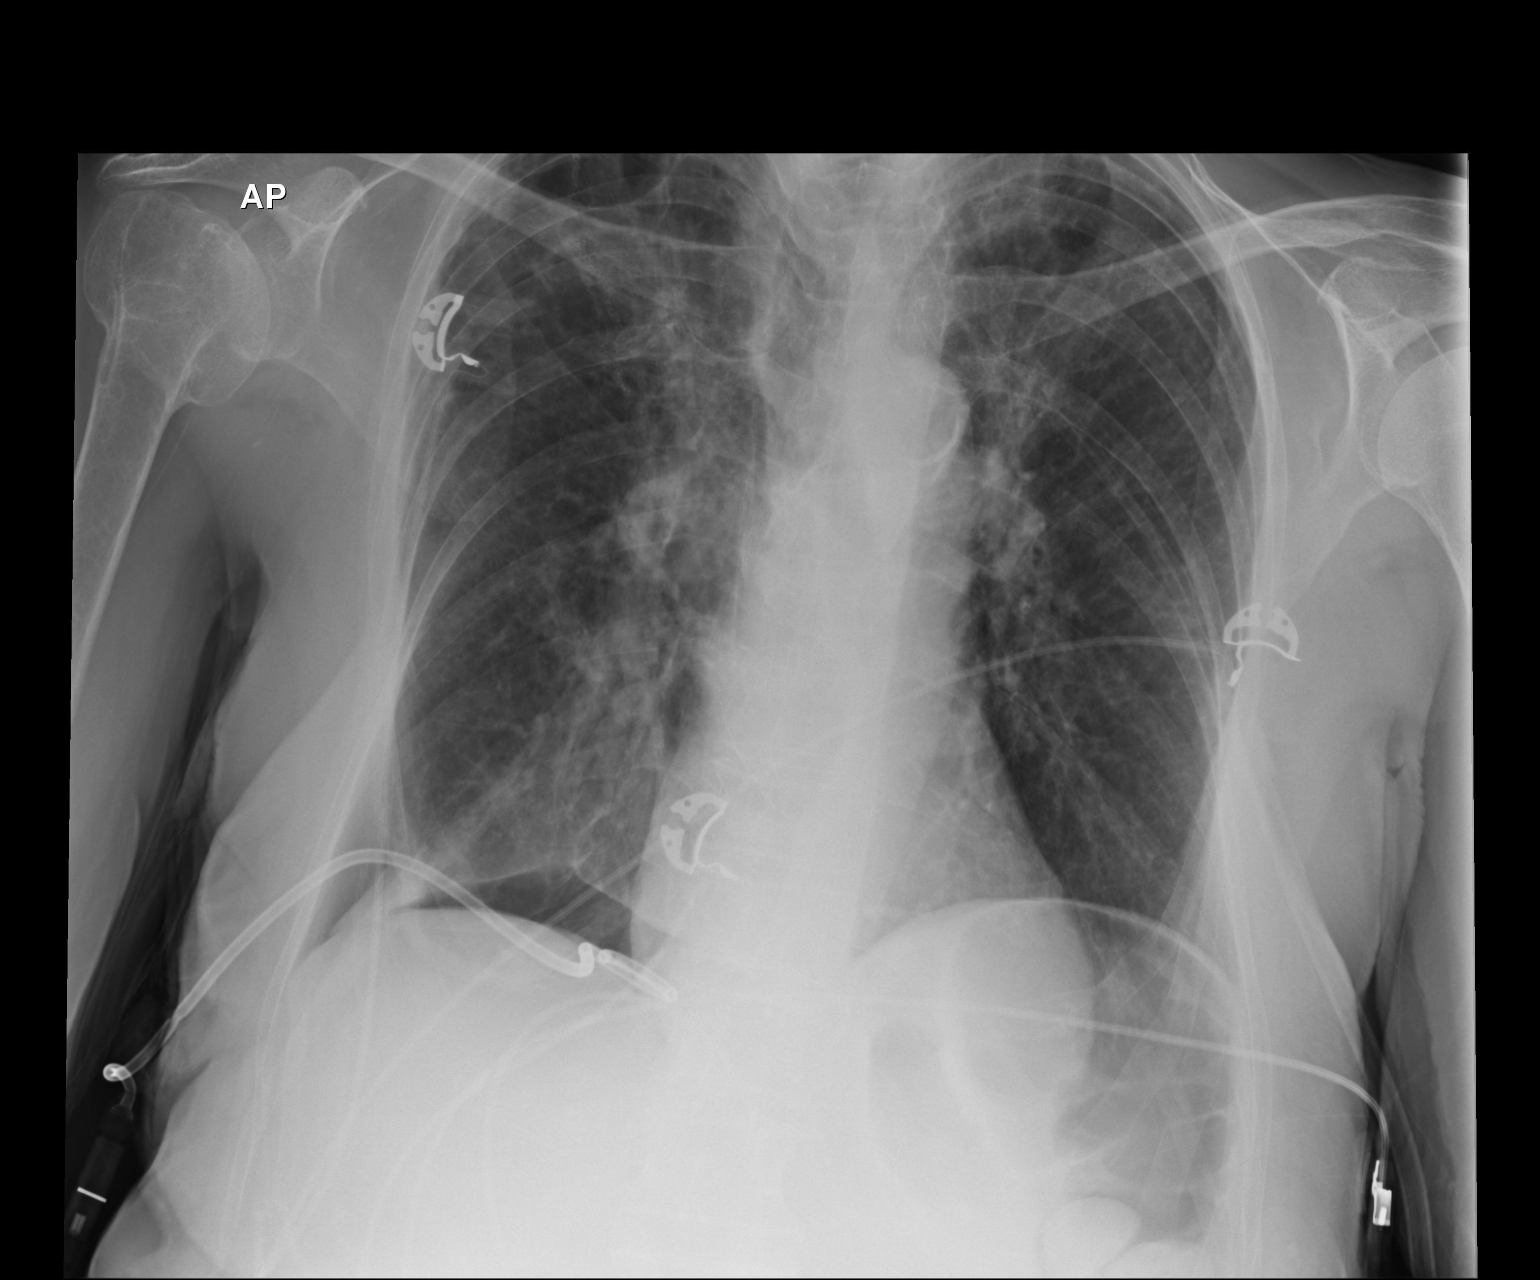

[1 of 1 positions shown; findings below may reference images not displayed]

FINDINGS: Pigtail catheter inferior aspect of the right thorax. Inferiorly and
laterally located right-sided pneumothorax minimally changed from
the prior exam. Tiny component of pneumothorax medially and within
the apex unchanged.

Asymmetric airspace disease greatest right lung base stable.

Postsurgical changes right lung apex with apical pleural thickening
slightly nodular appearance. Attention to this on followup. This is
been noted on prior exams.

Pulmonary vascular prominence most notable centrally.

Calcified aorta.

Heart size within normal limits.
IMPRESSION: Right-sided chest tube in place with similar appearance of
right-sided pneumothorax as noted above.

## 2017-02-25 IMAGING — CR DG CHEST 1V PORT
1 series · 1 of 1 positions shown · non-contrast
Comparison: 10/14/2014.

CLINICAL DATA: Pneumothorax.

EXAM:
PORTABLE CHEST - 1 VIEW

[AP]
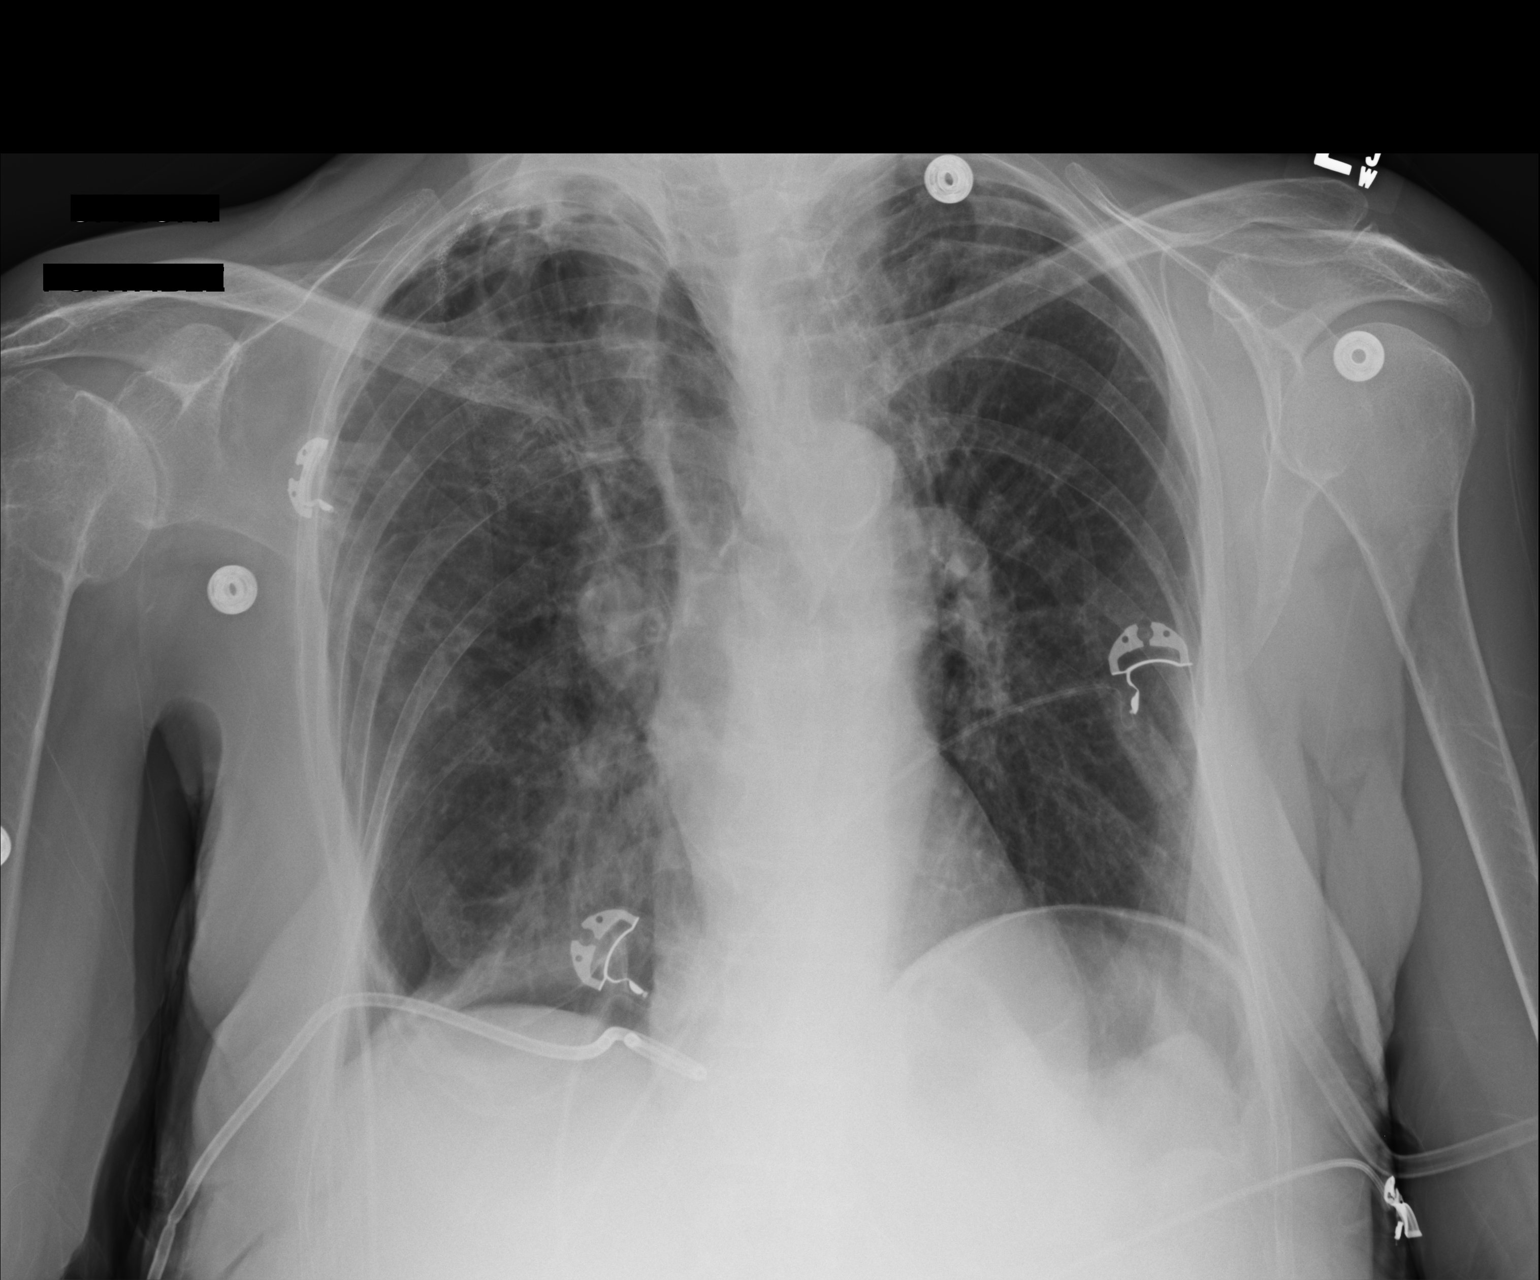

[1 of 1 positions shown; findings below may reference images not displayed]

FINDINGS: Right chest tube in stable position. Interim partial resolution of
right pneumothorax. Mediastinum and hilar structures are stable.
Heart size stable. Postsurgical changes right pulmonary apex. Stable
apical pleural parenchymal thickening. Persistent right base
atelectasis and/or infiltrate. Interposition of colon under the left
hemidiaphragm.
IMPRESSION: 1. Right chest tube in stable position. Interim partial resolution
of right pneumothorax.
2. Postsurgical changes right upper lung with stable
pleural-parenchymal thickening.
3. Persistent unchanged right base atelectasis and/or infiltrate.

## 2017-02-26 IMAGING — CR DG CHEST 1V PORT
1 series · 1 of 1 positions shown · non-contrast
Comparison: Portable chest x-ray of 10/15/2014

CLINICAL DATA: Evaluate chest tubes

EXAM:
PORTABLE CHEST - 1 VIEW

[AP]
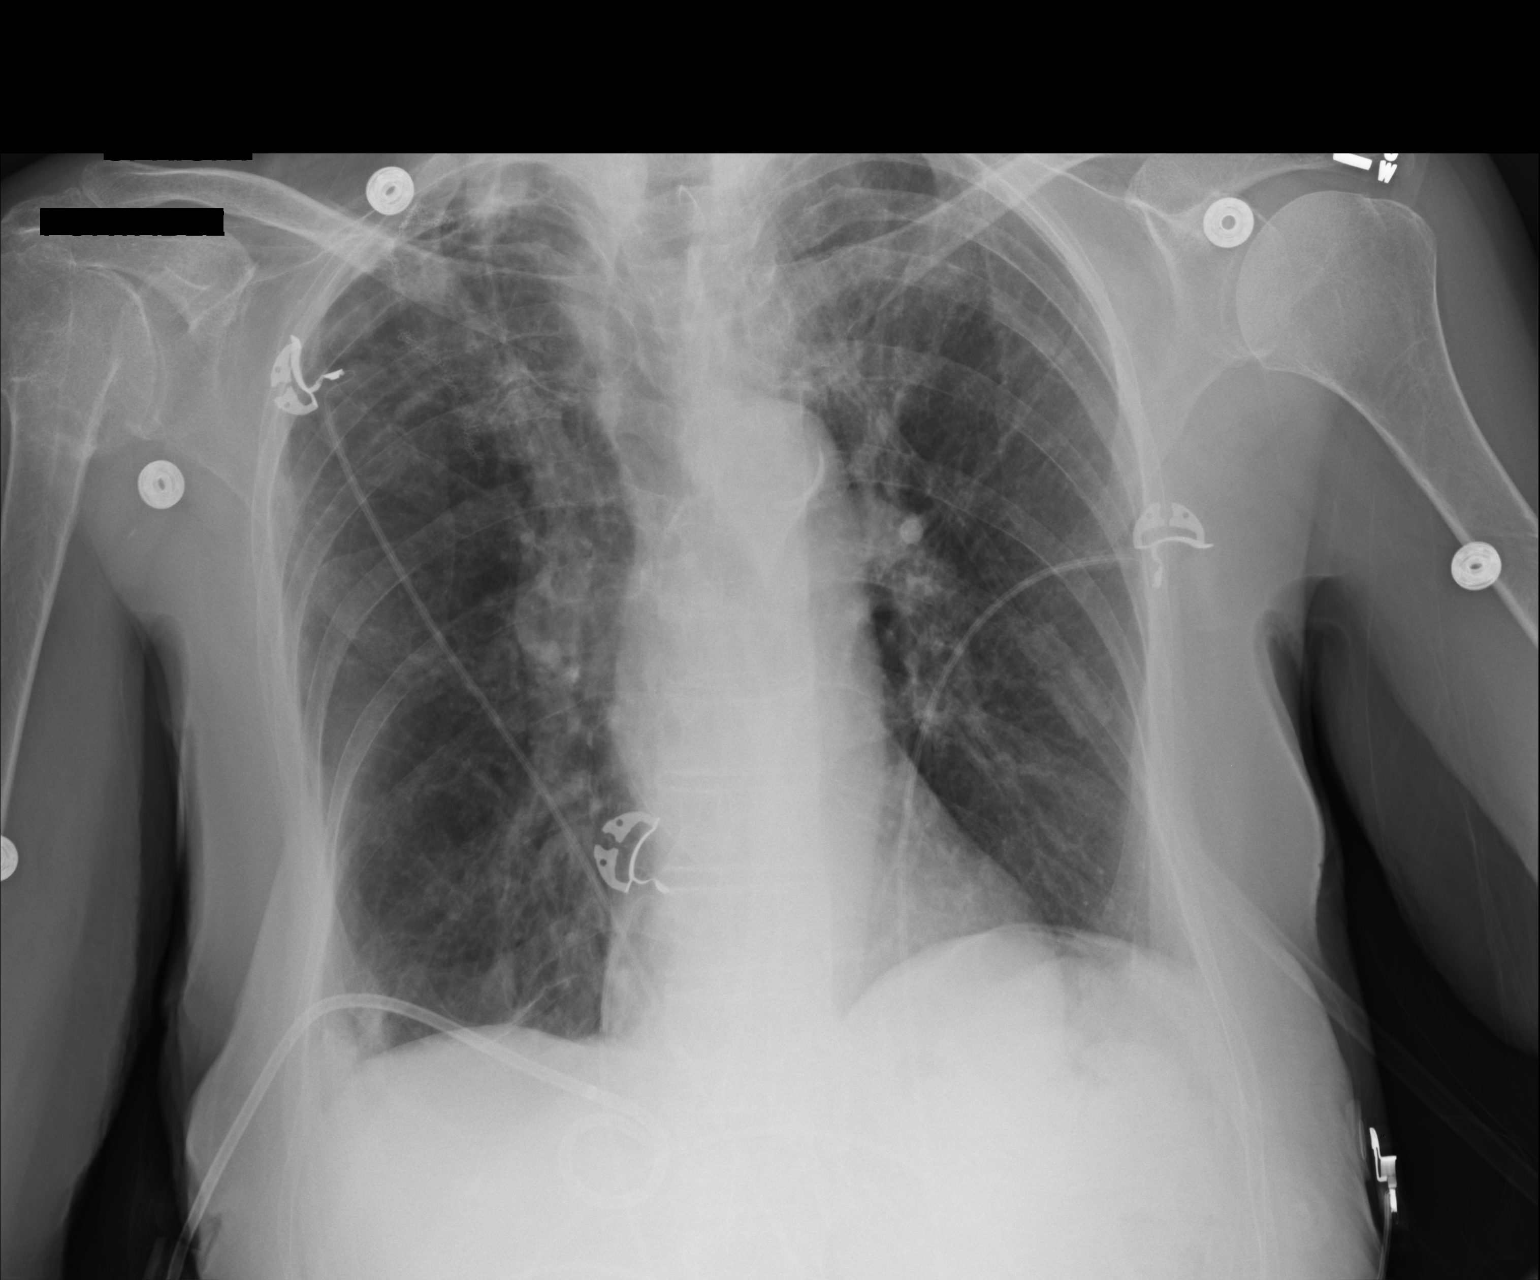

[1 of 1 positions shown; findings below may reference images not displayed]

FINDINGS: There may still be a small apical component of the previous right
pneumothorax with scarring in the right apex as well. The right
chest tube is unchanged in position and the basilar component of the
pneumothorax is not definitely seen currently. The left lung is
clear. Heart size is stable.
IMPRESSION: 1. Decrease in size of small right pneumothorax with only a tiny
apical component remaining.
2. No change in right chest tube.

## 2017-02-27 IMAGING — DX DG CHEST 1V PORT
1 series · 1 of 1 positions shown · non-contrast
Comparison: 10/16/2014 .

CLINICAL DATA: Pneumothorax.

EXAM:
PORTABLE CHEST - 1 VIEW

[chest ap]
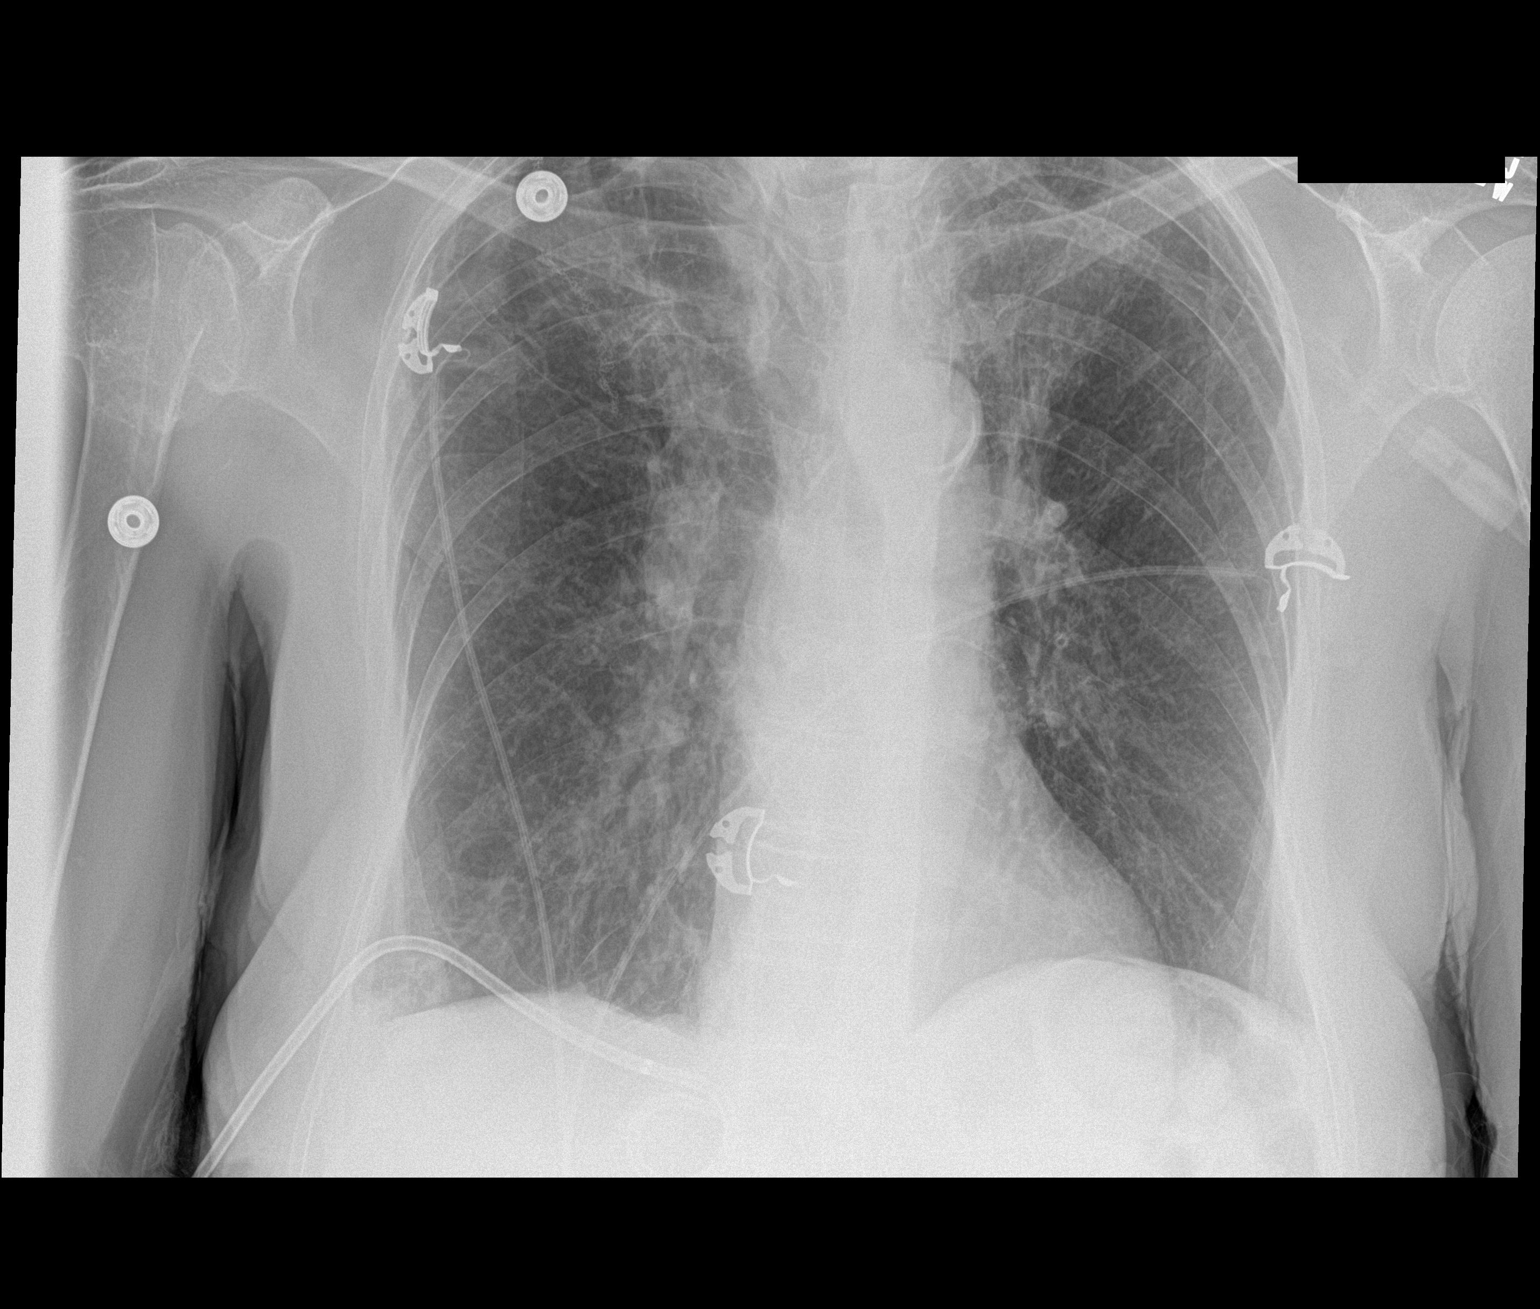

[1 of 1 positions shown; findings below may reference images not displayed]

FINDINGS: Right chest tube in stable position. Mediastinum hilar structures
are no stable. Postsurgical changes noted of the right lung. Tiny
right apical pneumothorax again cannot be excluded. Mild right base
subsegmental atelectasis. No focal new infiltrates. Heart size
normal. Deformity noted the right humerus.
IMPRESSION: 1. Right chest tube in stable position. Postsurgical changes right
upper lung. Tiny right apical pneumothorax again cannot be excluded.
No evidence of progression.
2. Stable right base subsegmental atelectasis.

## 2017-03-01 IMAGING — CR DG CHEST 2V
2 series · 2 of 2 positions shown · non-contrast
Comparison: 10/18/2014 and 10/17/2014.

CLINICAL DATA: Follow up pneumothorax.

EXAM:
CHEST  2 VIEW

[chest pa]
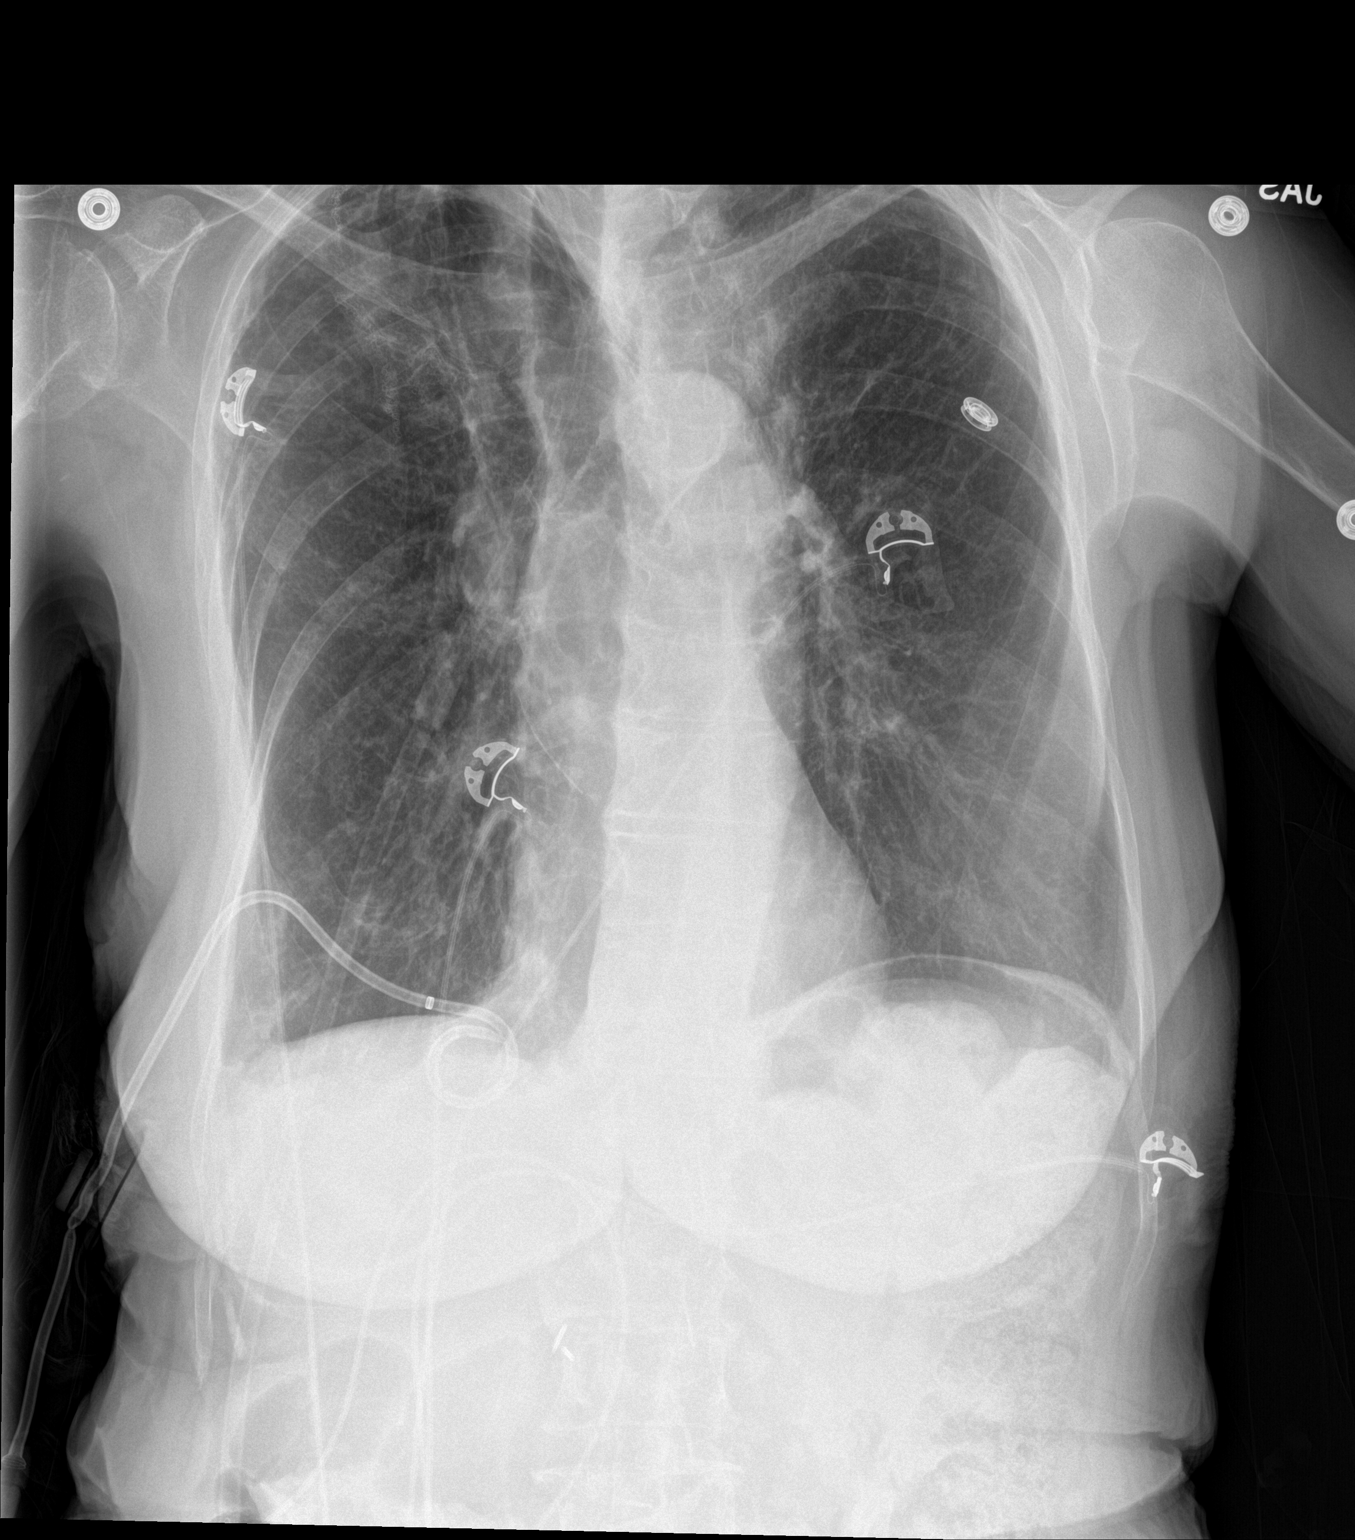

[chest lat]
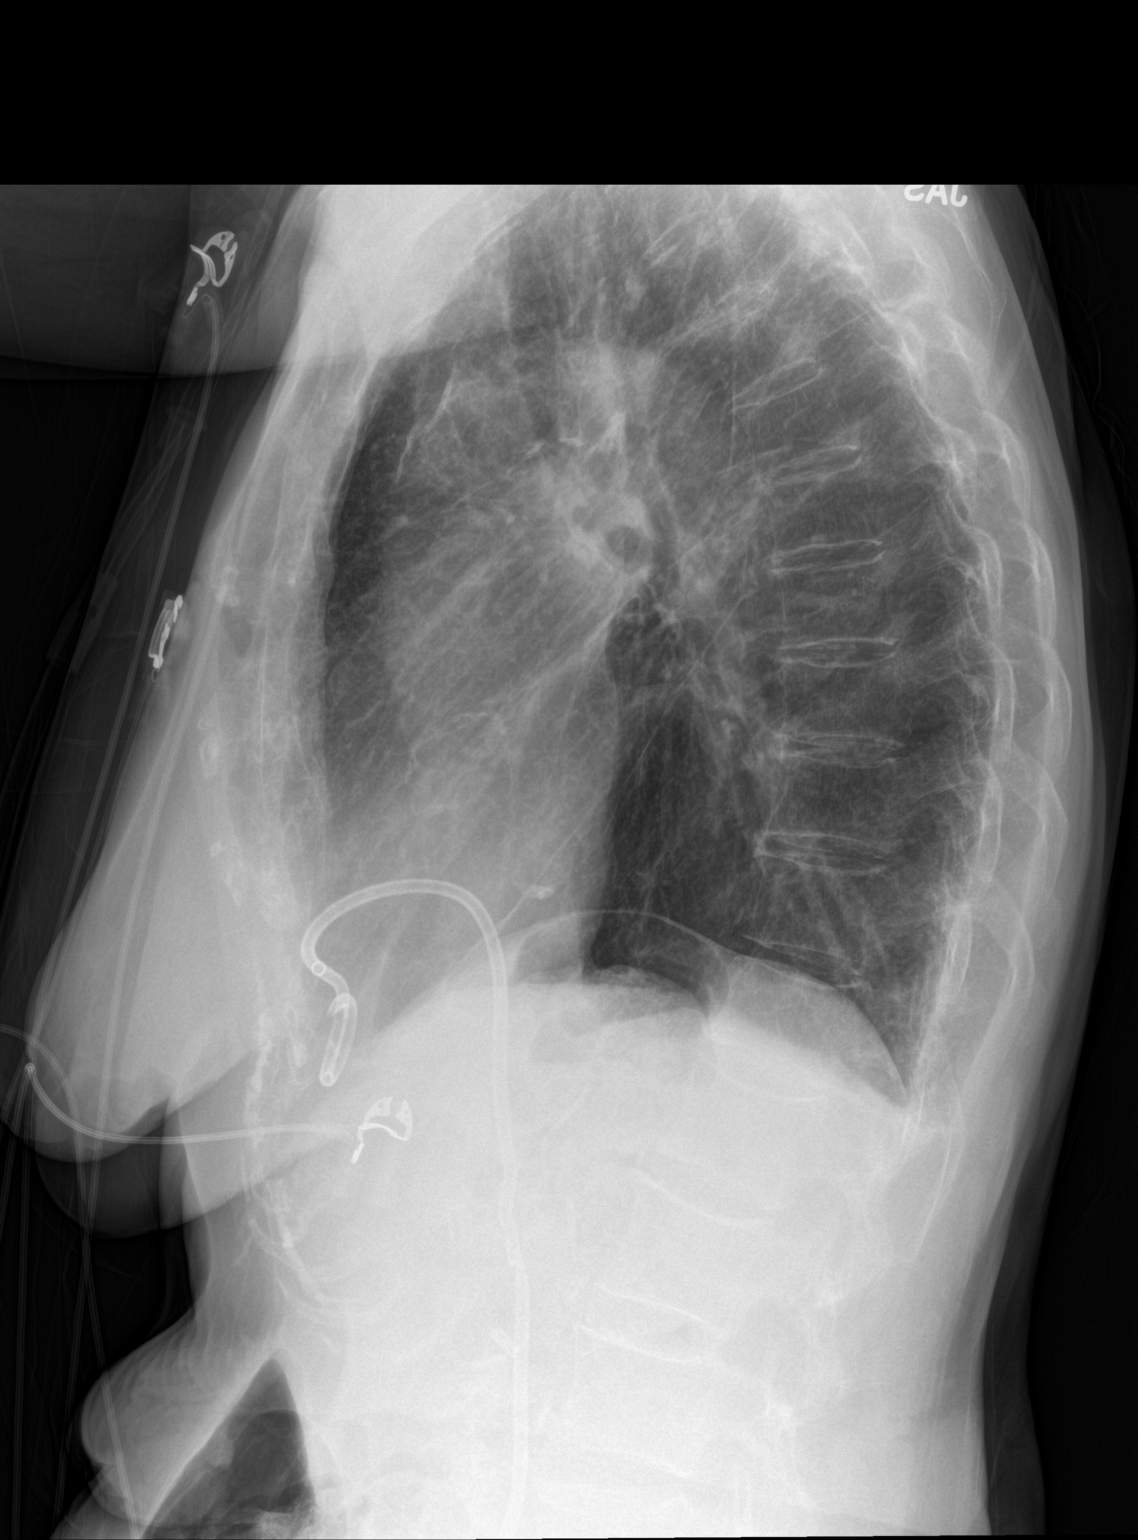

[2 of 2 positions shown; findings below may reference images not displayed]

FINDINGS: Small caliber right-sided chest tube is unchanged anteriorly near
the right cardiophrenic angle. No recurrent pneumothorax identified.
There are stable postsurgical changes at the right apex with
adjacent pleural thickening. There is stable chronic lung disease
with superior hilar retraction. The heart size and mediastinal
contours are stable.
IMPRESSION: Stable examination.  No evidence of recurrent pneumothorax.

## 2017-04-03 IMAGING — CT CT IMAGE GUIDED DRAINAGE BY PERCUTANEOUS CATHETER
1 of 3 series · 14 of 32 positions shown, 18 images · non-contrast
Comparison: Fluoroscopic guided right-sided chest tube placement -
10/09/2014

INDICATION: Recurrent right-sided pneumothorax. Note, patient with history of
fluoroscopic guided right-sided chest tube placement on 10/09/2014.
The percutaneous drainage catheter was removed on 11/06/2014,
however patient returns with symptomatic right-sided pneumothorax.
Request made for placement of a new new CT-guided chest tube.

EXAM:
CT IMAGE GUIDED DRAINAGE BY PERCUTANEOUS CATHETER

[Series 2: i-spiral 5.0 b40f · axial · 0.98mm/px · z∈[+1358,+1589]mm · 14 of 74 slices shown, 18 images]
[im 4/74  soft-tissue]
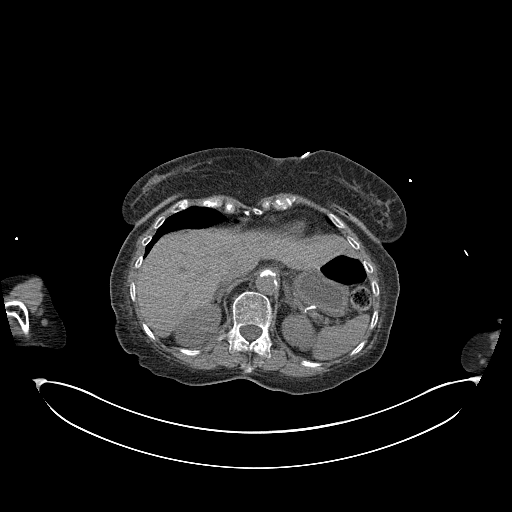
[im 4/74  bone]
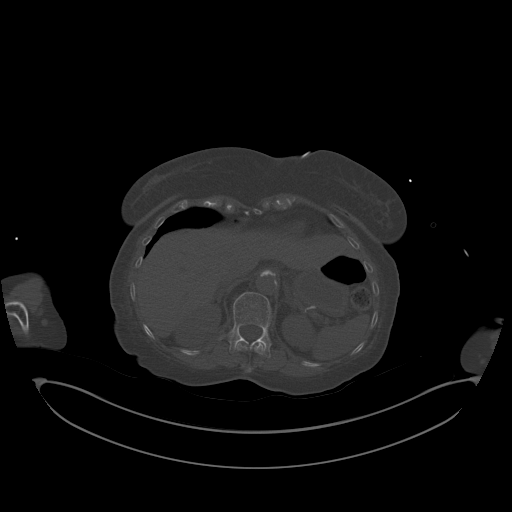
[im 10/74  soft-tissue]
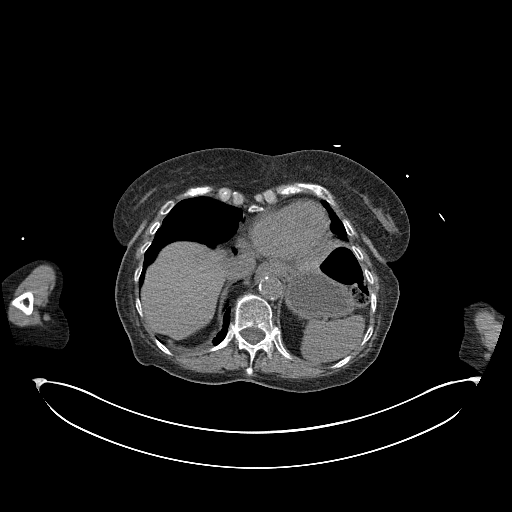
[im 17/74  soft-tissue]
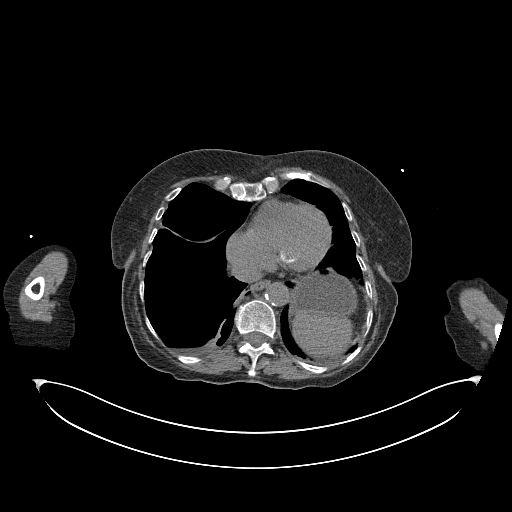
[im 24/74  soft-tissue]
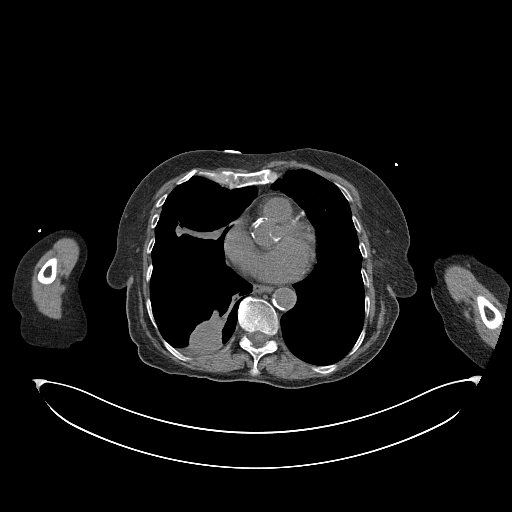
[im 27/74  soft-tissue]
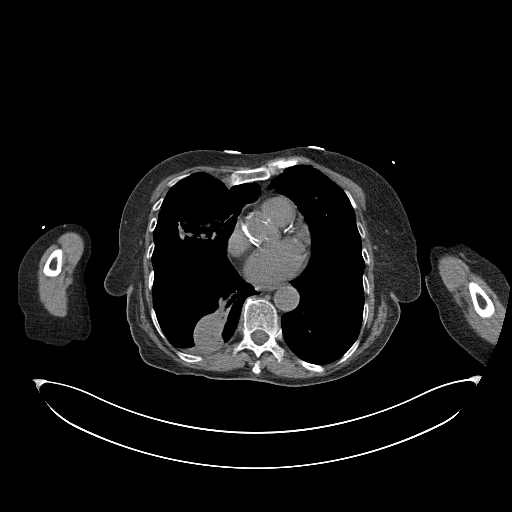
[im 34/74  soft-tissue]
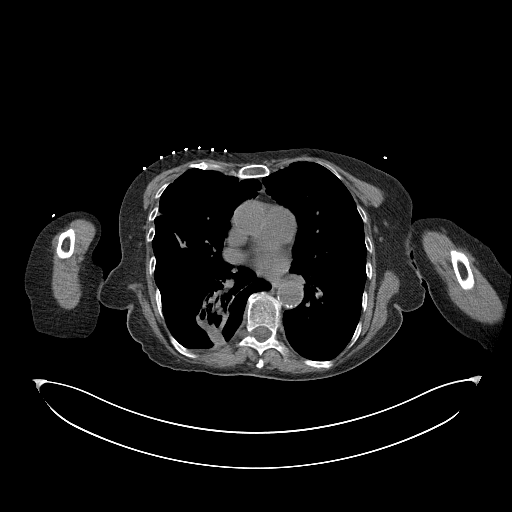
[im 40/74  soft-tissue]
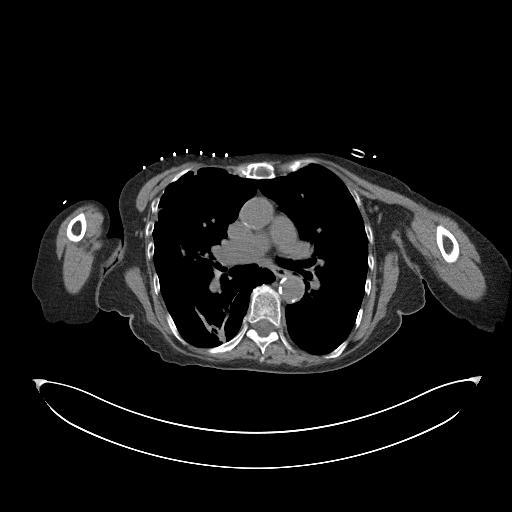
[im 47/74  soft-tissue]
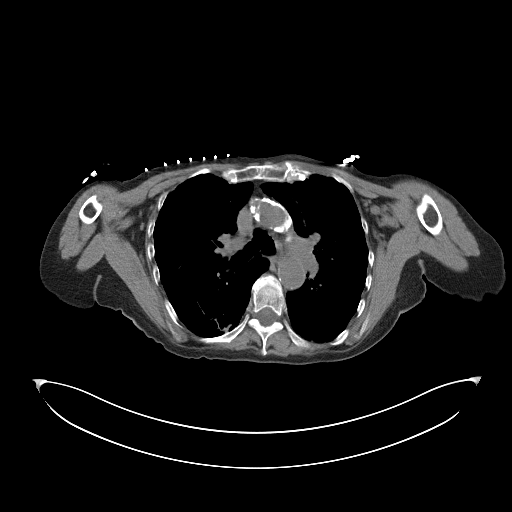
[im 50/74  soft-tissue]
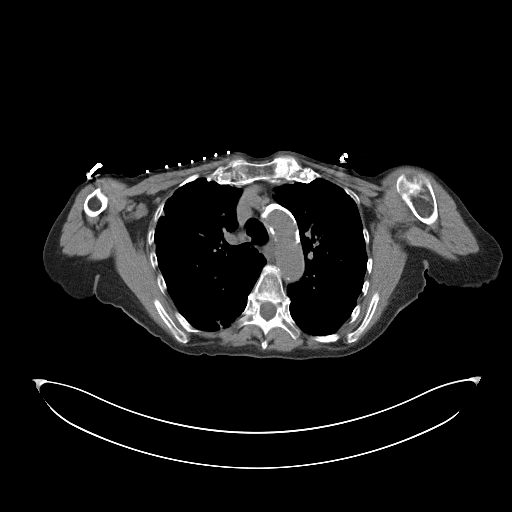
[im 50/74  bone]
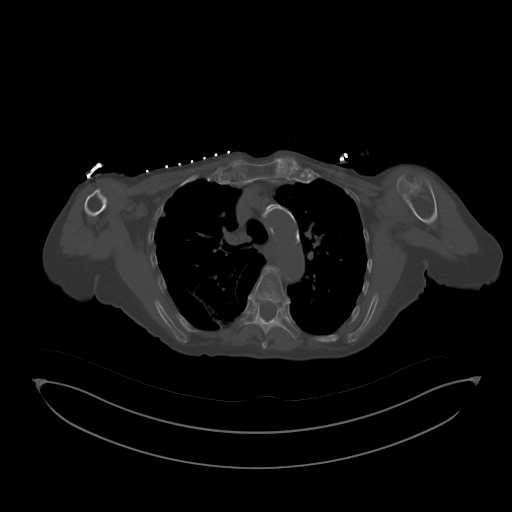
[im 57/74  soft-tissue]
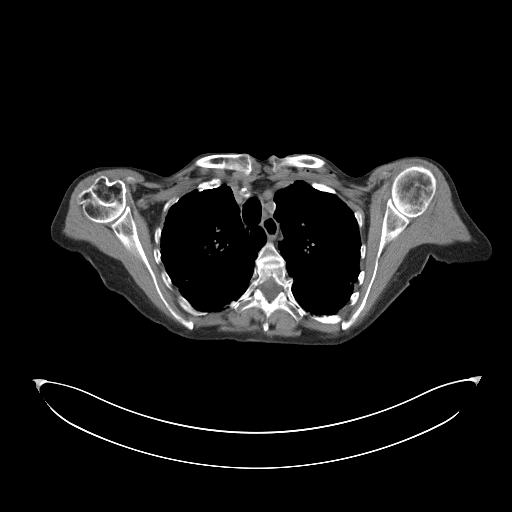
[im 60/74  lung]
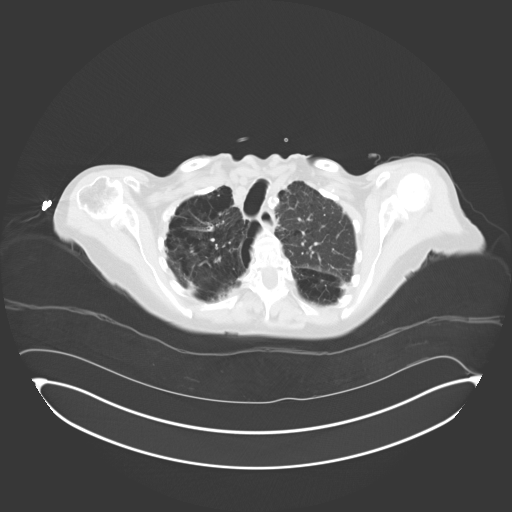
[im 64/74  soft-tissue]
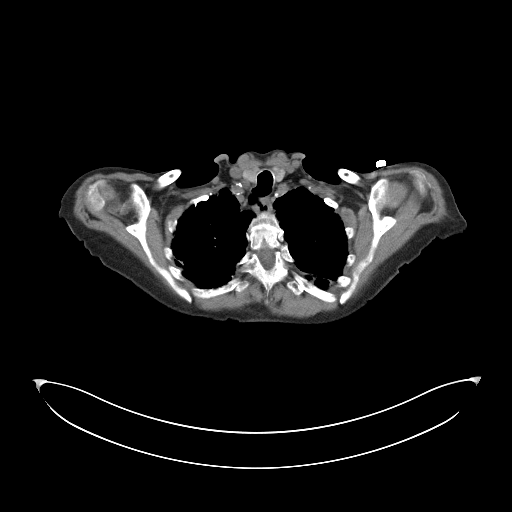
[im 64/74  lung]
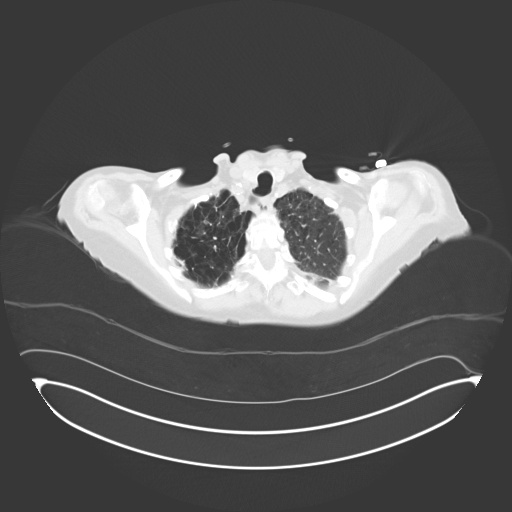
[im 67/74  lung]
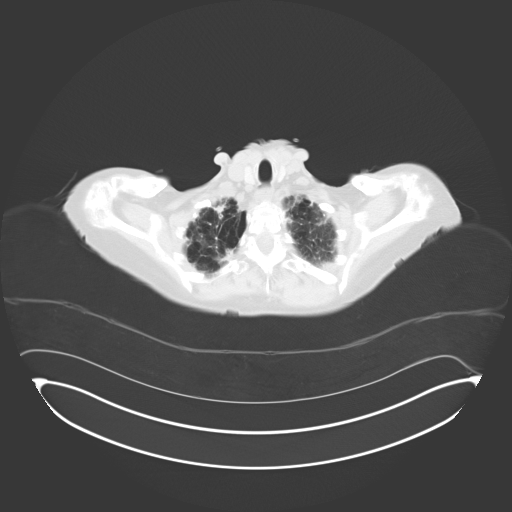
[im 70/74  soft-tissue]
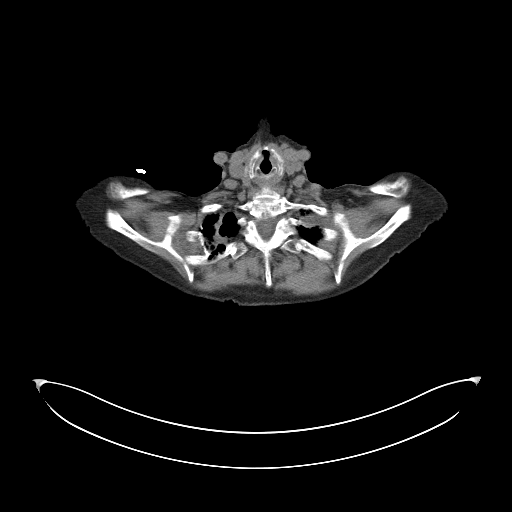
[im 70/74  lung]
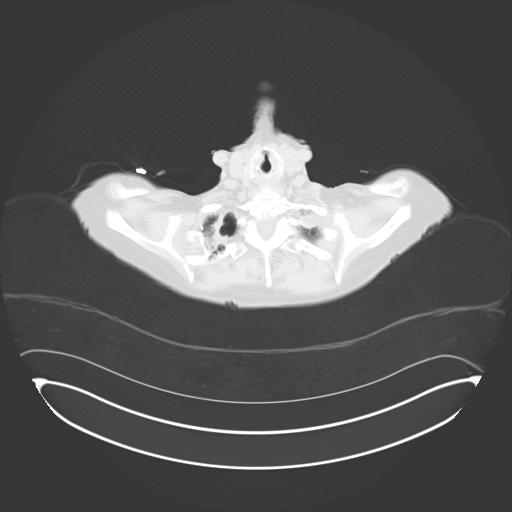

[14 of 32 positions shown; findings below may reference images not displayed]

MEDICATIONS:
The patient is currently admitted to the hospital and receiving
intravenous antibiotics. The antibiotics were administered within an
appropriate time frame prior to the initiation of the procedure.

ANESTHESIA/SEDATION:
Fentanyl 50 mcg IV; Versed 0.5 mg IV

Total Moderate Sedation time

12 minutes

CONTRAST:  None

COMPLICATIONS:
None immediate

PROCEDURE:
Informed written consent was obtained from the patient after a
discussion of the risks, benefits and alternatives to treatment. The
patient was placed supine on the CT gantry and a pre procedural CT
was performed re-demonstrating the known large right basilar
pneumothorax. The procedure was planned. A timeout was performed
prior to the initiation of the procedure.

The inferior aspect the right lateral chest was prepped and draped
in the usual sterile fashion. The overlying soft tissues were
anesthetized with 1% lidocaine with epinephrine. Appropriate
trajectory was planned with the use of a 22 gauge spinal needle. An
18 gauge trocar needle was advanced into the right pleural space and
a short Amplatz super stiff wire was coiled within the collection.
Appropriate positioning was confirmed with a limited CT scan. The
tract was serially dilated allowing placement of a 12 French
all-purpose drainage catheter. Appropriate positioning was confirmed
with a limited postprocedural CT scan.

The tube was connected to a Pleur-Evac and sutured in place. A
dressing was placed. The patient tolerated the procedure well
without immediate post procedural complication.
IMPRESSION: Successful CT guided placement of a 12 French all purpose drain
catheter into the right pleural space and connected to a pleural
vac. Samples were sent to the laboratory as requested by the
ordering clinical team.

## 2017-04-04 IMAGING — DX DG CHEST 1V PORT
1 series · 1 of 1 positions shown · non-contrast
Comparison: 11/20/2014.

CLINICAL DATA: Chest tube.  Pneumothorax.

EXAM:
PORTABLE CHEST - 1 VIEW

[chest ap]
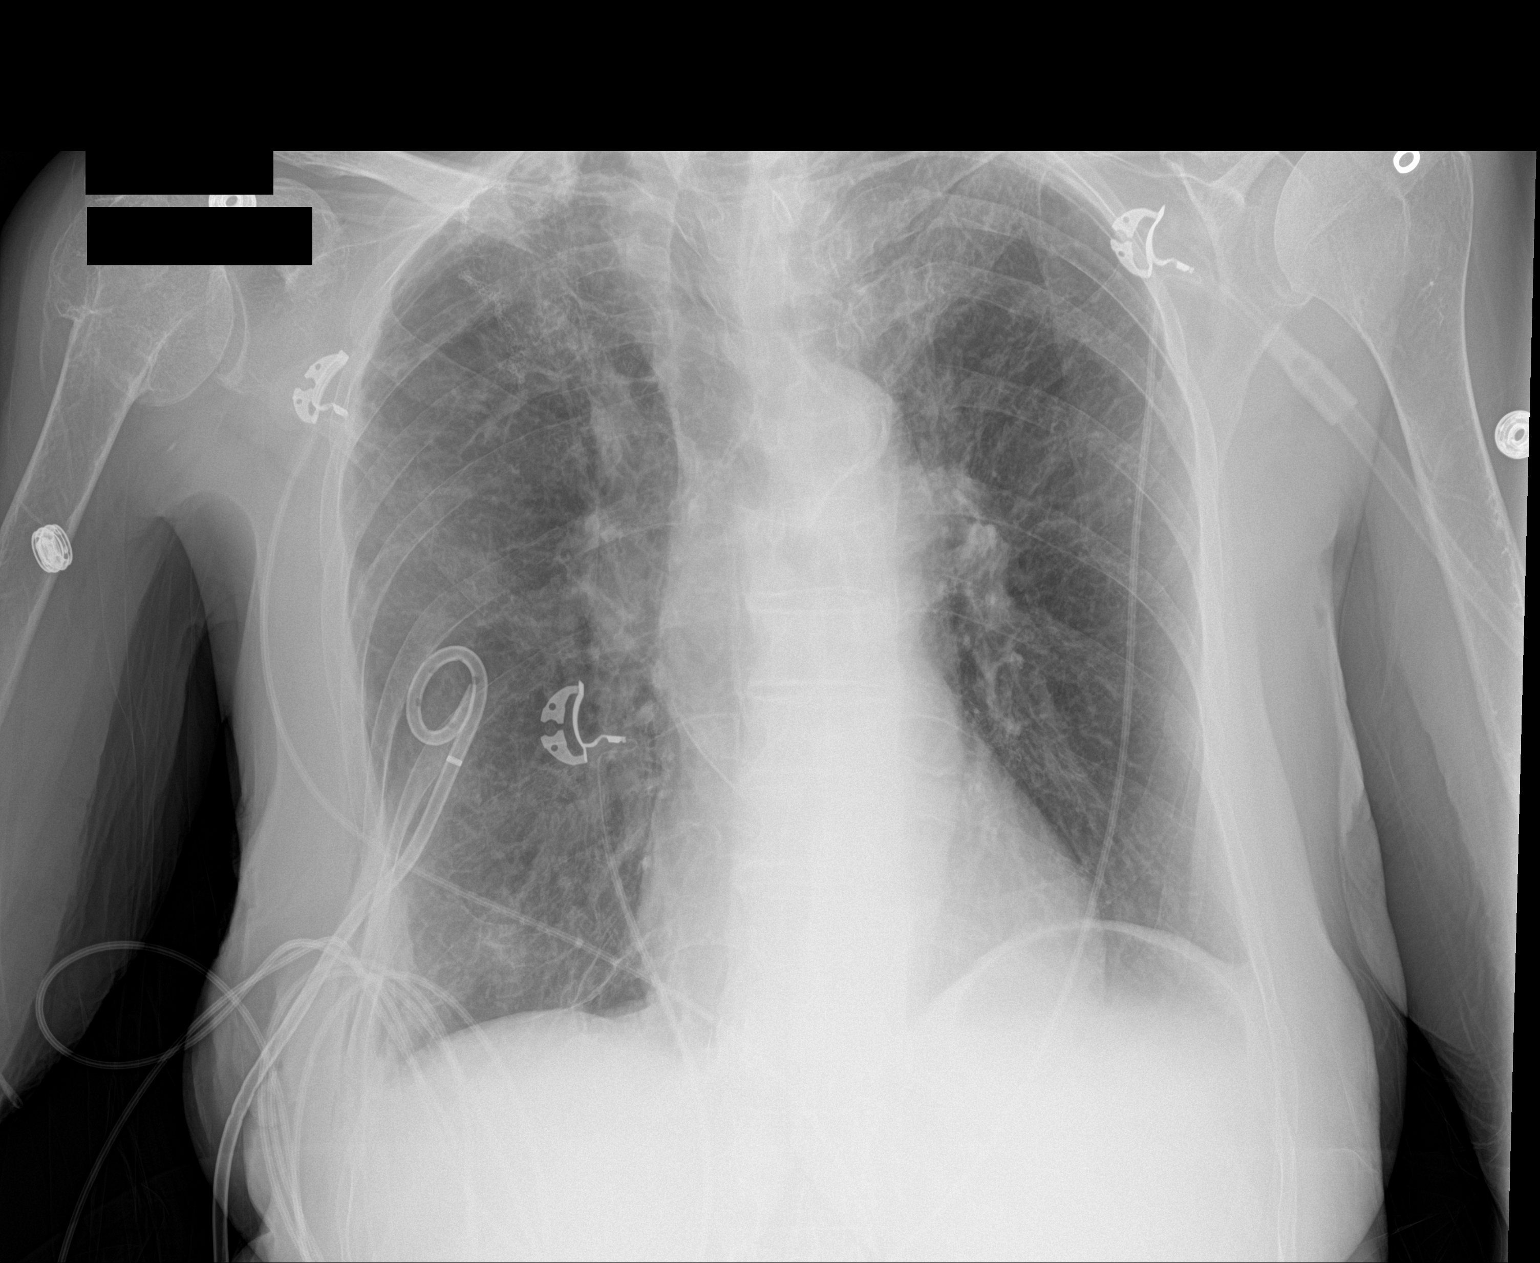

[1 of 1 positions shown; findings below may reference images not displayed]

FINDINGS: Interim placement of right chest tube. Interim near complete
resolution of right pneumothorax with mild residual right base
pneumothorax. Postsurgical changes right upper lung. Heart size
normal. Mild subcutaneous emphysema right chest wall.
IMPRESSION: 1. Interim placement of right chest tube. Near complete resolution
of right pneumothorax with mild residual basilar pneumothorax.
2. Postsurgical changes right upper lung.

## 2017-04-05 IMAGING — CR DG CHEST 1V PORT
1 series · 1 of 1 positions shown · non-contrast
Comparison: 11/22/2014.

CLINICAL DATA: Status post pigtail catheter for basilar
pneumothorax.

EXAM:
PORTABLE CHEST - 1 VIEW

[AP]
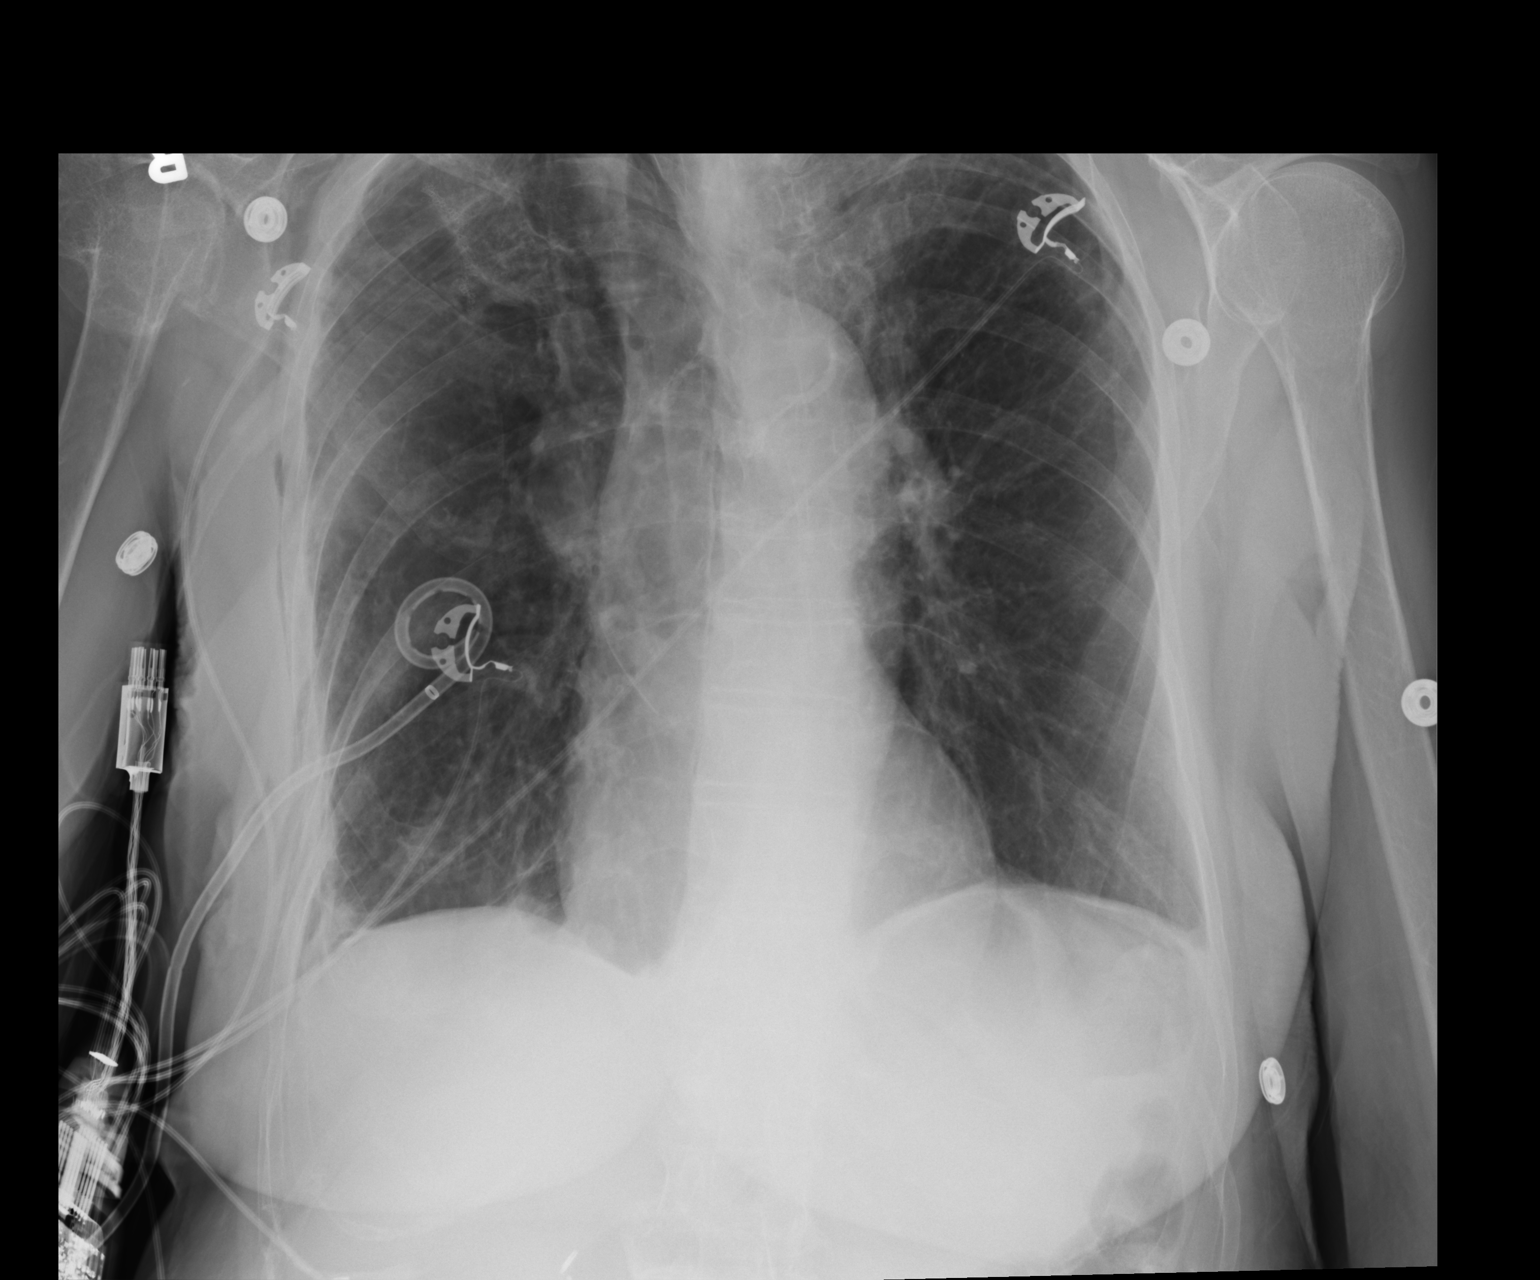

[1 of 1 positions shown; findings below may reference images not displayed]

FINDINGS: Stable aeration. Pigtail catheter good position. Tiny residual
basilar pneumothorax not excluded, not significantly worse from
yesterday's radiograph. Chronic changes of COPD. Normal
cardiomediastinal silhouette. Thoracic atherosclerosis. Chronic
deformity RIGHT humeral head. Small amount of subcutaneous emphysema
appears slightly increased.
IMPRESSION: Tiny residual basilar pneumothorax not excluded. Small amount of
subcutaneous emphysema. Pigtail catheter remains in good position.

## 2017-04-06 IMAGING — CR DG CHEST 1V PORT
1 series · 1 of 1 positions shown · non-contrast
Comparison: 11/23/2014

CLINICAL DATA: Pneumothorax, COPD

EXAM:
PORTABLE CHEST - 1 VIEW

[AP]
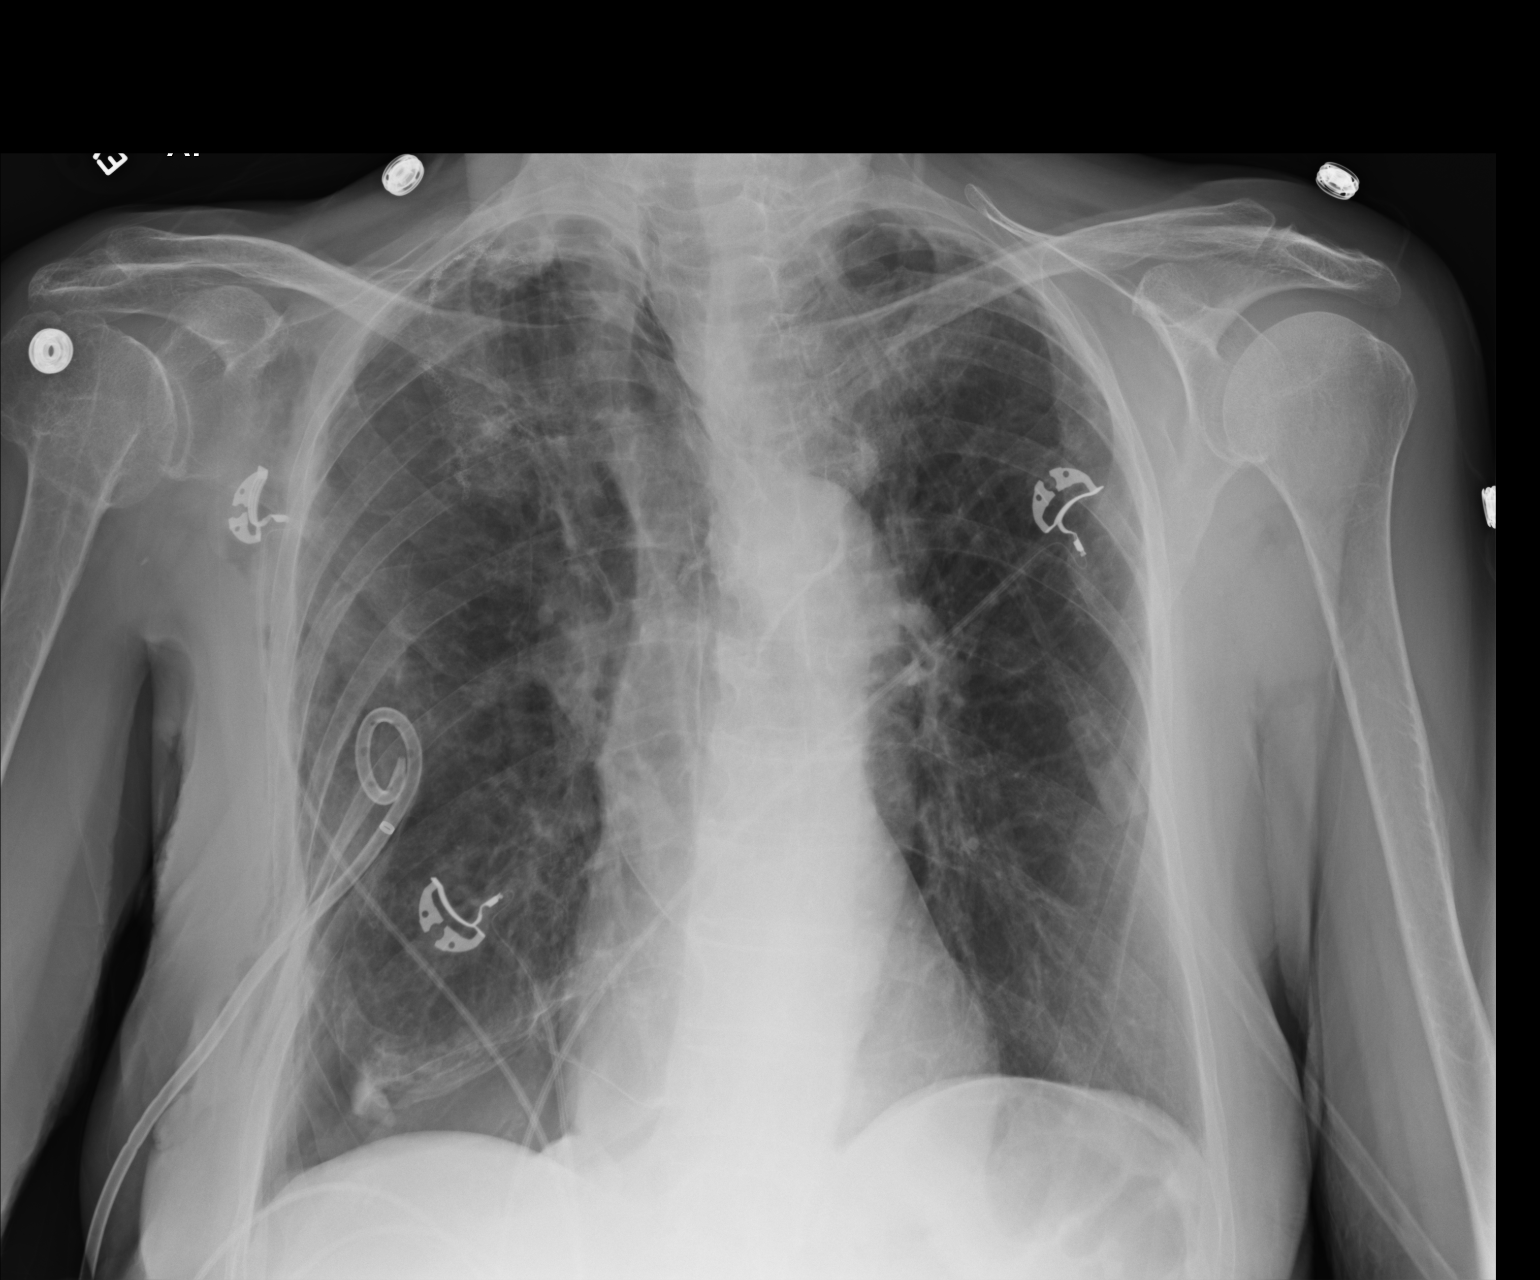

[1 of 1 positions shown; findings below may reference images not displayed]

FINDINGS: Pigtail catheter is reidentified projecting over the right mid lung
zone. Trace right basilar pneumothorax persists with right basilar
scarring or atelectasis. This may be slightly larger or better seen
due to differences in technique. Heart size is normal. Emphysematous
changes with biapical pleural thickening reidentified. Left lung
grossly clear. No mediastinal shift. Right humeral impaction
reidentified.
IMPRESSION: Trace right basilar pneumothorax reidentified, either slightly
larger than previously or possibly better seen due to differences in
technique.

## 2017-04-07 IMAGING — CR DG CHEST 1V PORT
1 series · 1 of 1 positions shown · non-contrast
Comparison: Prior chest x-ray 11/24/2014

CLINICAL DATA: 74-year-old female with persistent right basilar
pneumothorax

EXAM:
PORTABLE CHEST - 1 VIEW

[AP]
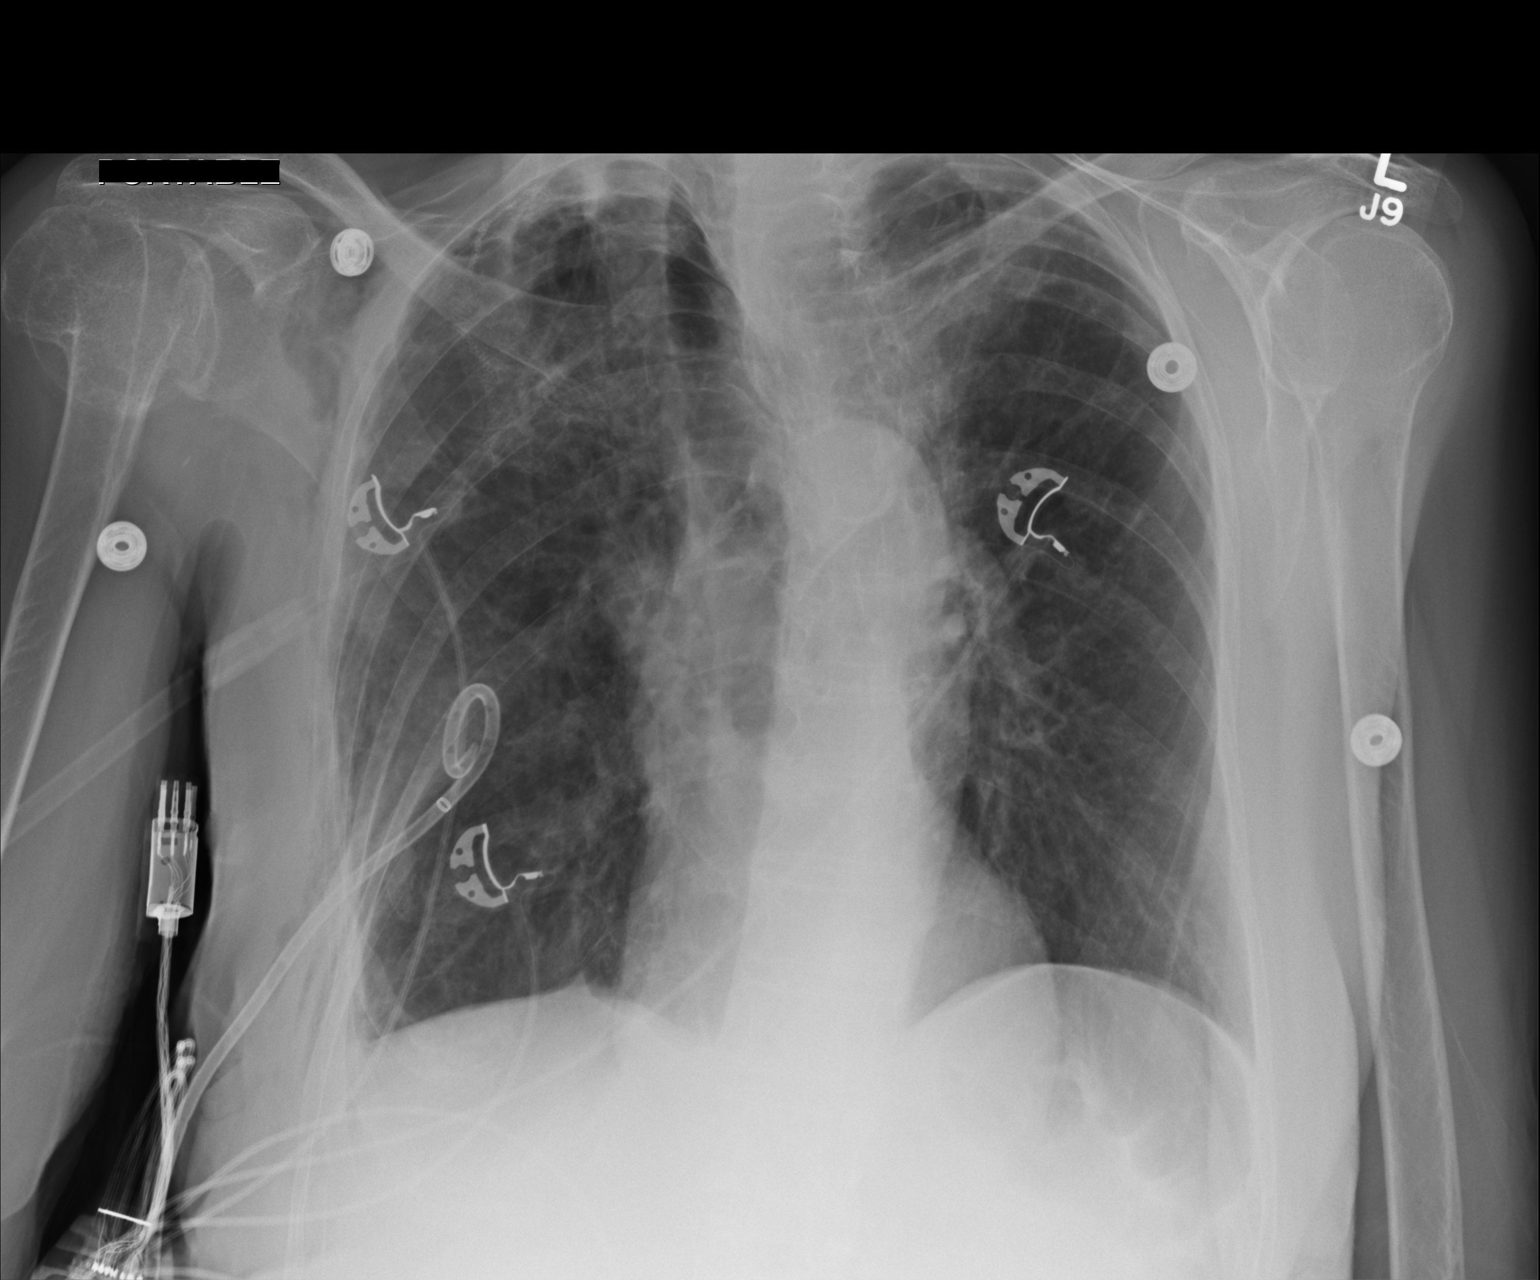

[1 of 1 positions shown; findings below may reference images not displayed]

FINDINGS: Right pigtail thoracostomy tube in place. No evidence of residual
pneumothorax. Subcutaneous emphysema is again noted along the right
lateral chest wall. Surgical changes of prior right upper lobe
blebectomy with residual pleural parenchymal scarring an emphysema.
Cardiac and mediastinal contours remain unchanged. The patient is
rotated toward the right. Atherosclerotic calcifications noted
throughout the transverse aorta. Stable bronchitic change. No acute
osseous abnormality. Sequelae of remote healed right proximal
humerus fracture.
IMPRESSION: 1. Interval resolution of right basilar pneumothorax with right
pigtail thoracostomy tube in place.

## 2017-04-08 IMAGING — CR DG CHEST 1V PORT
1 series · 1 of 1 positions shown · non-contrast
Comparison: 11/25/2014.

CLINICAL DATA: Chest tube.  Pneumothorax.

EXAM:
PORTABLE CHEST - 1 VIEW

[AP]
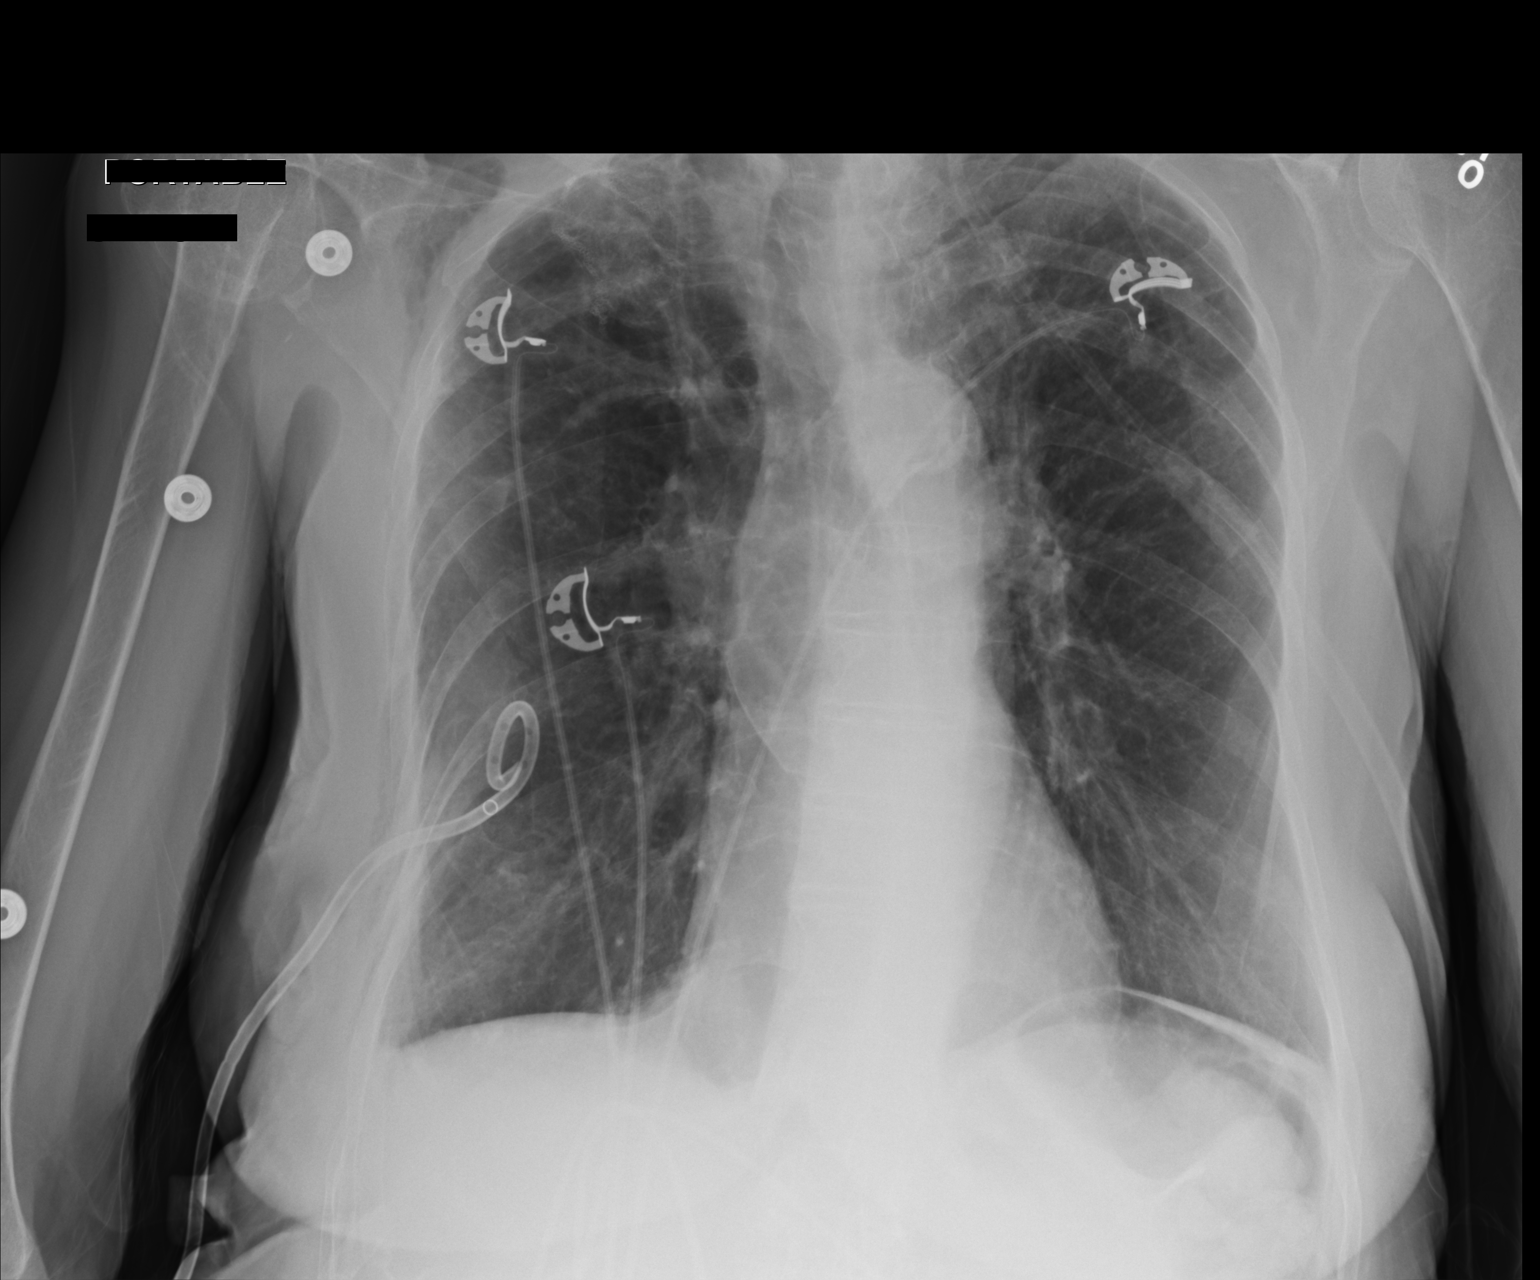

[1 of 1 positions shown; findings below may reference images not displayed]

FINDINGS: Right chest tube in stable position. No pneumothorax. Mediastinum
and hilar structures normal. Heart size normal. Biapical
pleural-parenchymal thickening noted consistent with scarring.
Subcutaneous emphysema right chest wall. No acute bony abnormality.
IMPRESSION: 1. Right chest tube in stable position. No pneumothorax.
Subcutaneous emphysema right chest wall again noted.
2. No acute cardiopulmonary disease. Biapical pleural parenchymal
thickening consistent with scarring.

## 2017-04-09 IMAGING — CT CT HEAD WO/W CM
3 series · 17 of 30 positions shown, 19 images · IV contrast (Iodine)
Comparison: Noncontrast head CT 10/12/2011. Head CT with and
without contrast 04/05/2007.

CLINICAL DATA: Recurrent headaches.

EXAM:
CT HEAD WITHOUT AND WITH CONTRAST
TECHNIQUE: Contiguous axial images were obtained from the base of the skull
through the vertex without and with intravenous contrast
CONTRAST:  75mL OMNIPAQUE IOHEXOL 300 MG/ML  SOLN

[Series 201: head w/o, idose (1) · axial · non-contrast · 0.40mm/px · z∈[+81,+176]mm · 5 of 29 slices shown]
[im 5/29  brain]
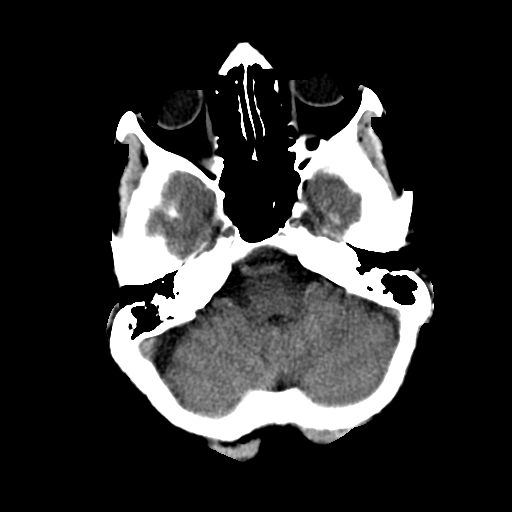
[im 10/29  brain]
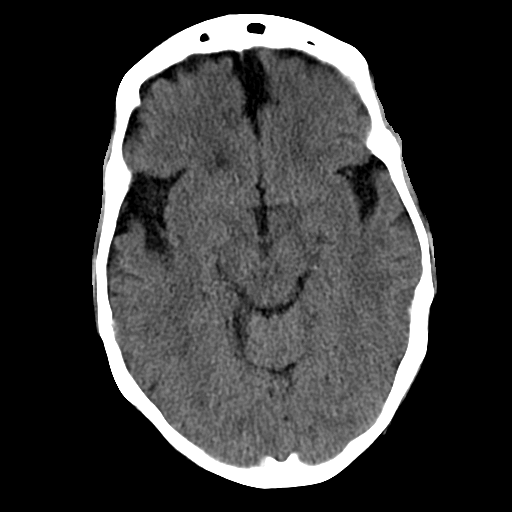
[im 15/29  brain]
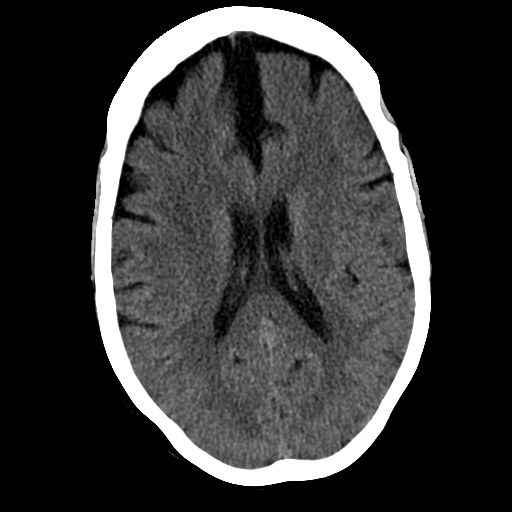
[im 19/29  brain]
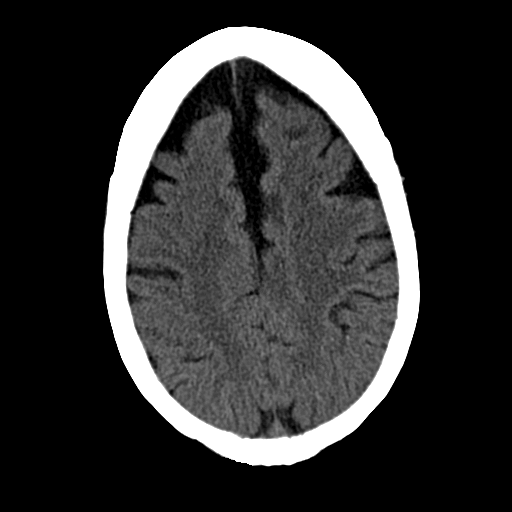
[im 24/29  brain]
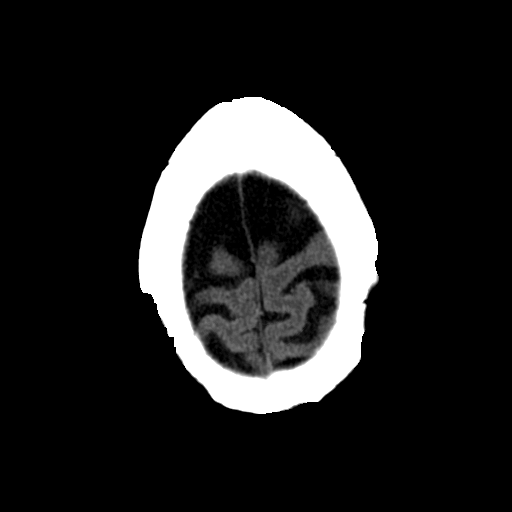

[Series 202: head w/o bone, idose (1) · axial · non-contrast · 0.40mm/px · z∈[+70,+150]mm · 6 of 58 slices shown]
[im 5/58  bone]
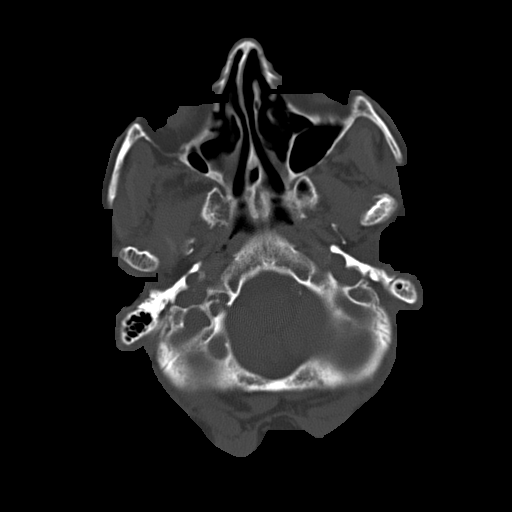
[im 13/58  bone]
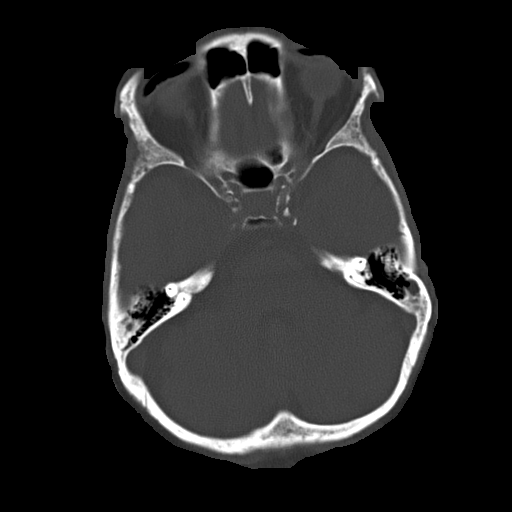
[im 21/58  bone]
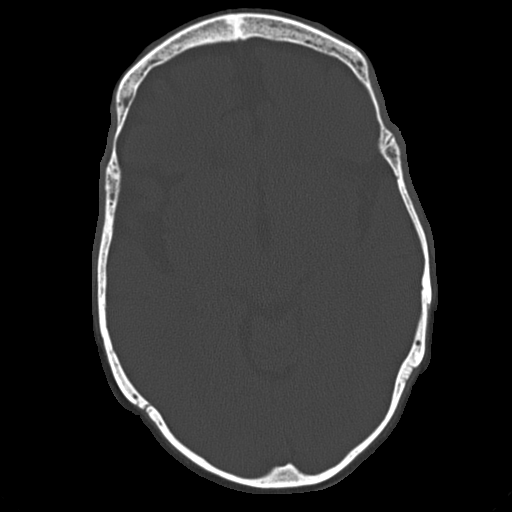
[im 25/58  bone]
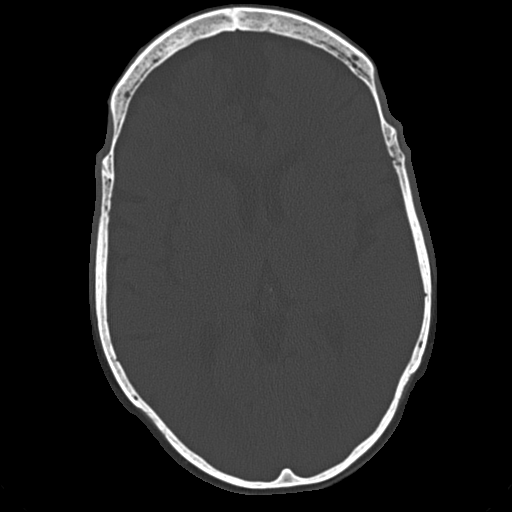
[im 33/58  bone]
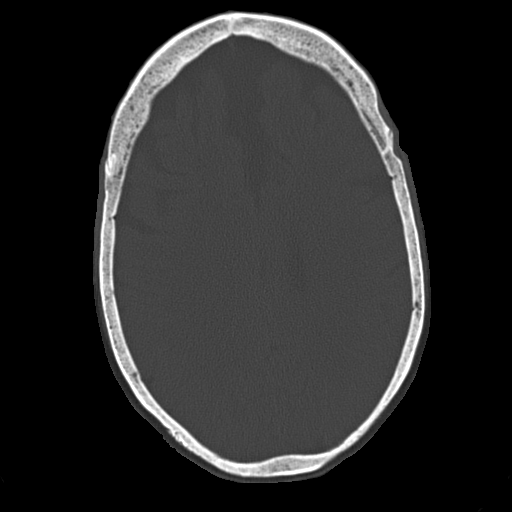
[im 37/58  bone]
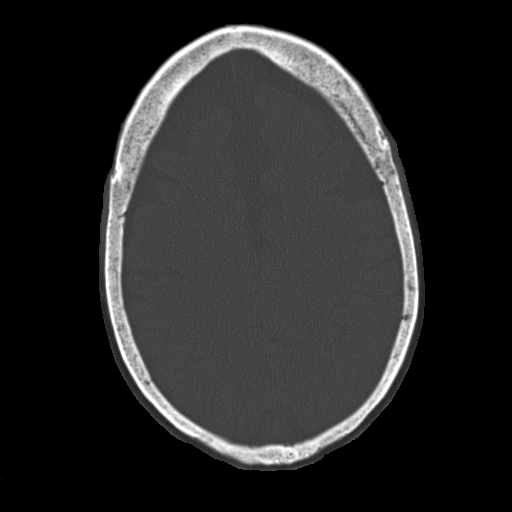

[Series 301: head with , idose (1) · axial · 0.40mm/px · z∈[+81,+181]mm · 6 of 29 slices shown, 8 images]
[im 5/29  brain]
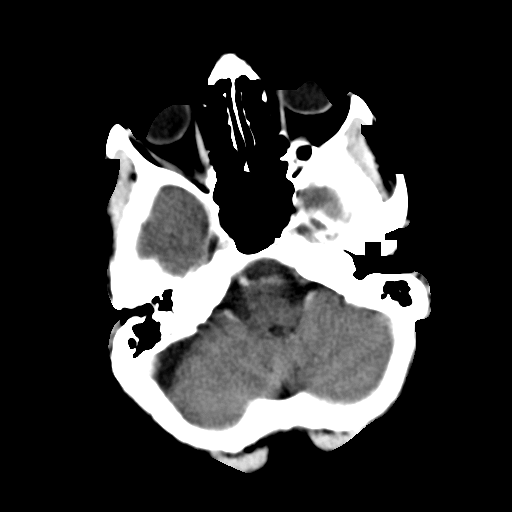
[im 5/29  bone]
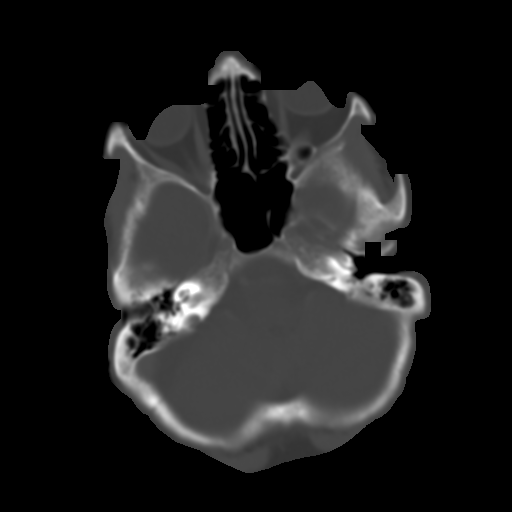
[im 9/29  brain]
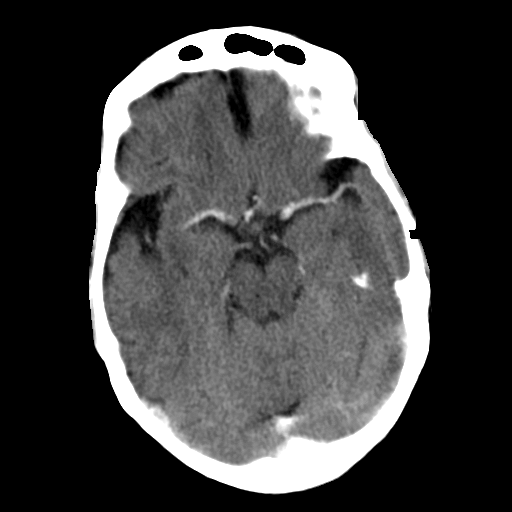
[im 13/29  brain]
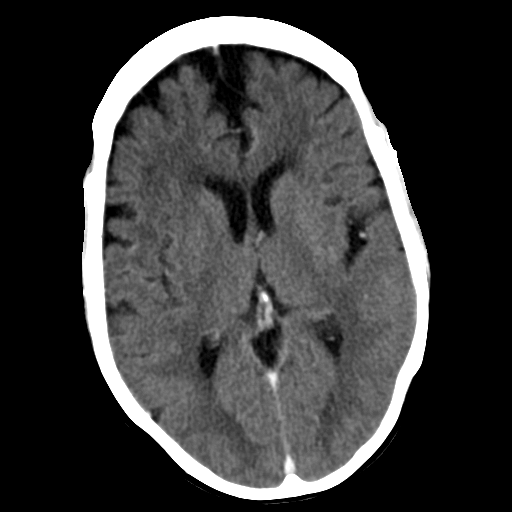
[im 17/29  brain]
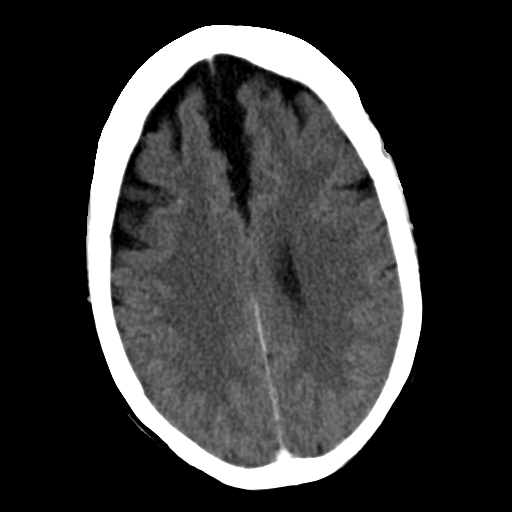
[im 21/29  brain]
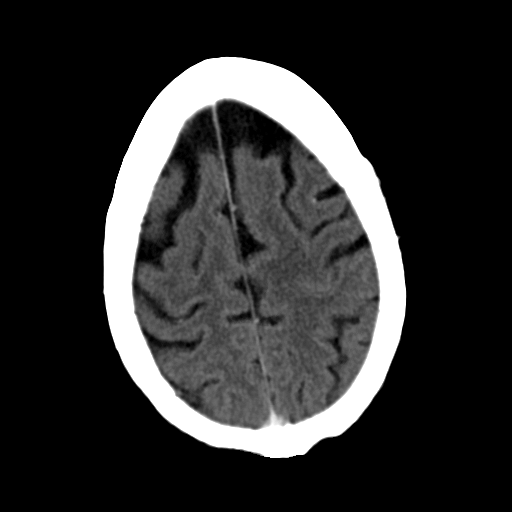
[im 21/29  bone]
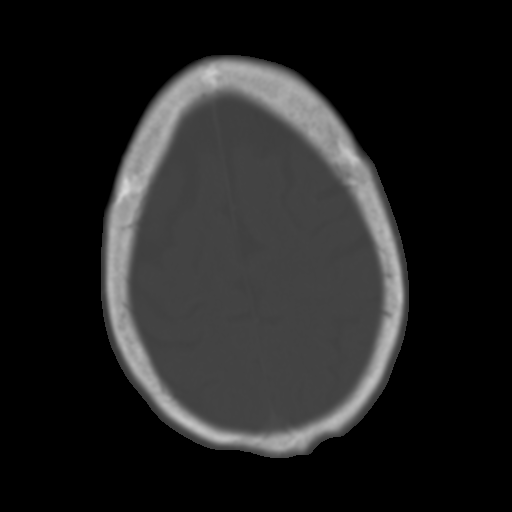
[im 25/29  brain]
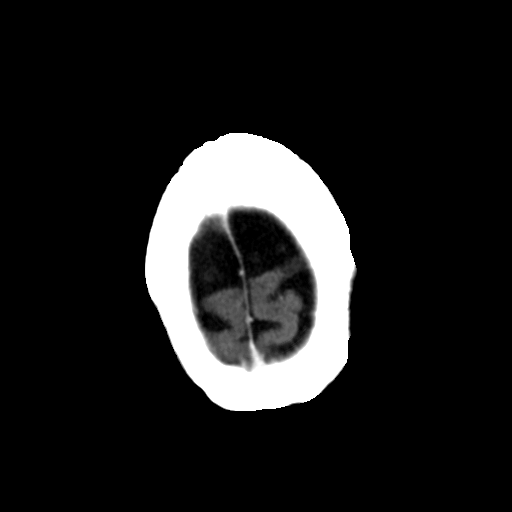

[17 of 30 positions shown; findings below may reference images not displayed]

FINDINGS: Mild cerebral atrophy is unchanged, most notably involving the
frontal lobes. Ventricles are normal in size. Periventricular and
subcortical white matter hypodensities are unchanged and nonspecific
but compatible with mild chronic small vessel ischemic disease.
There is no evidence of acute cortical infarct, intracranial
hemorrhage, mass, midline shift, or extra-axial fluid collection. No
abnormal enhancement is identified.

The visualized paranasal sinuses and mastoid air cells are clear.
Right maxillary sinus is atelectatic. Moderate calcified
atherosclerosis is noted at the skullbase. No skull fracture or
destructive osseous lesion is seen.
IMPRESSION: 1. No evidence of acute intracranial abnormality or mass.
2. Unchanged cerebral atrophy and chronic small vessel ischemia.

## 2017-04-09 IMAGING — DX DG CHEST 1V PORT
1 series · 1 of 1 positions shown · non-contrast
Comparison: November 26, 2014

CLINICAL DATA: Right chest tube placement for pneumothorax

EXAM:
PORTABLE CHEST - 1 VIEW

[chest ap]
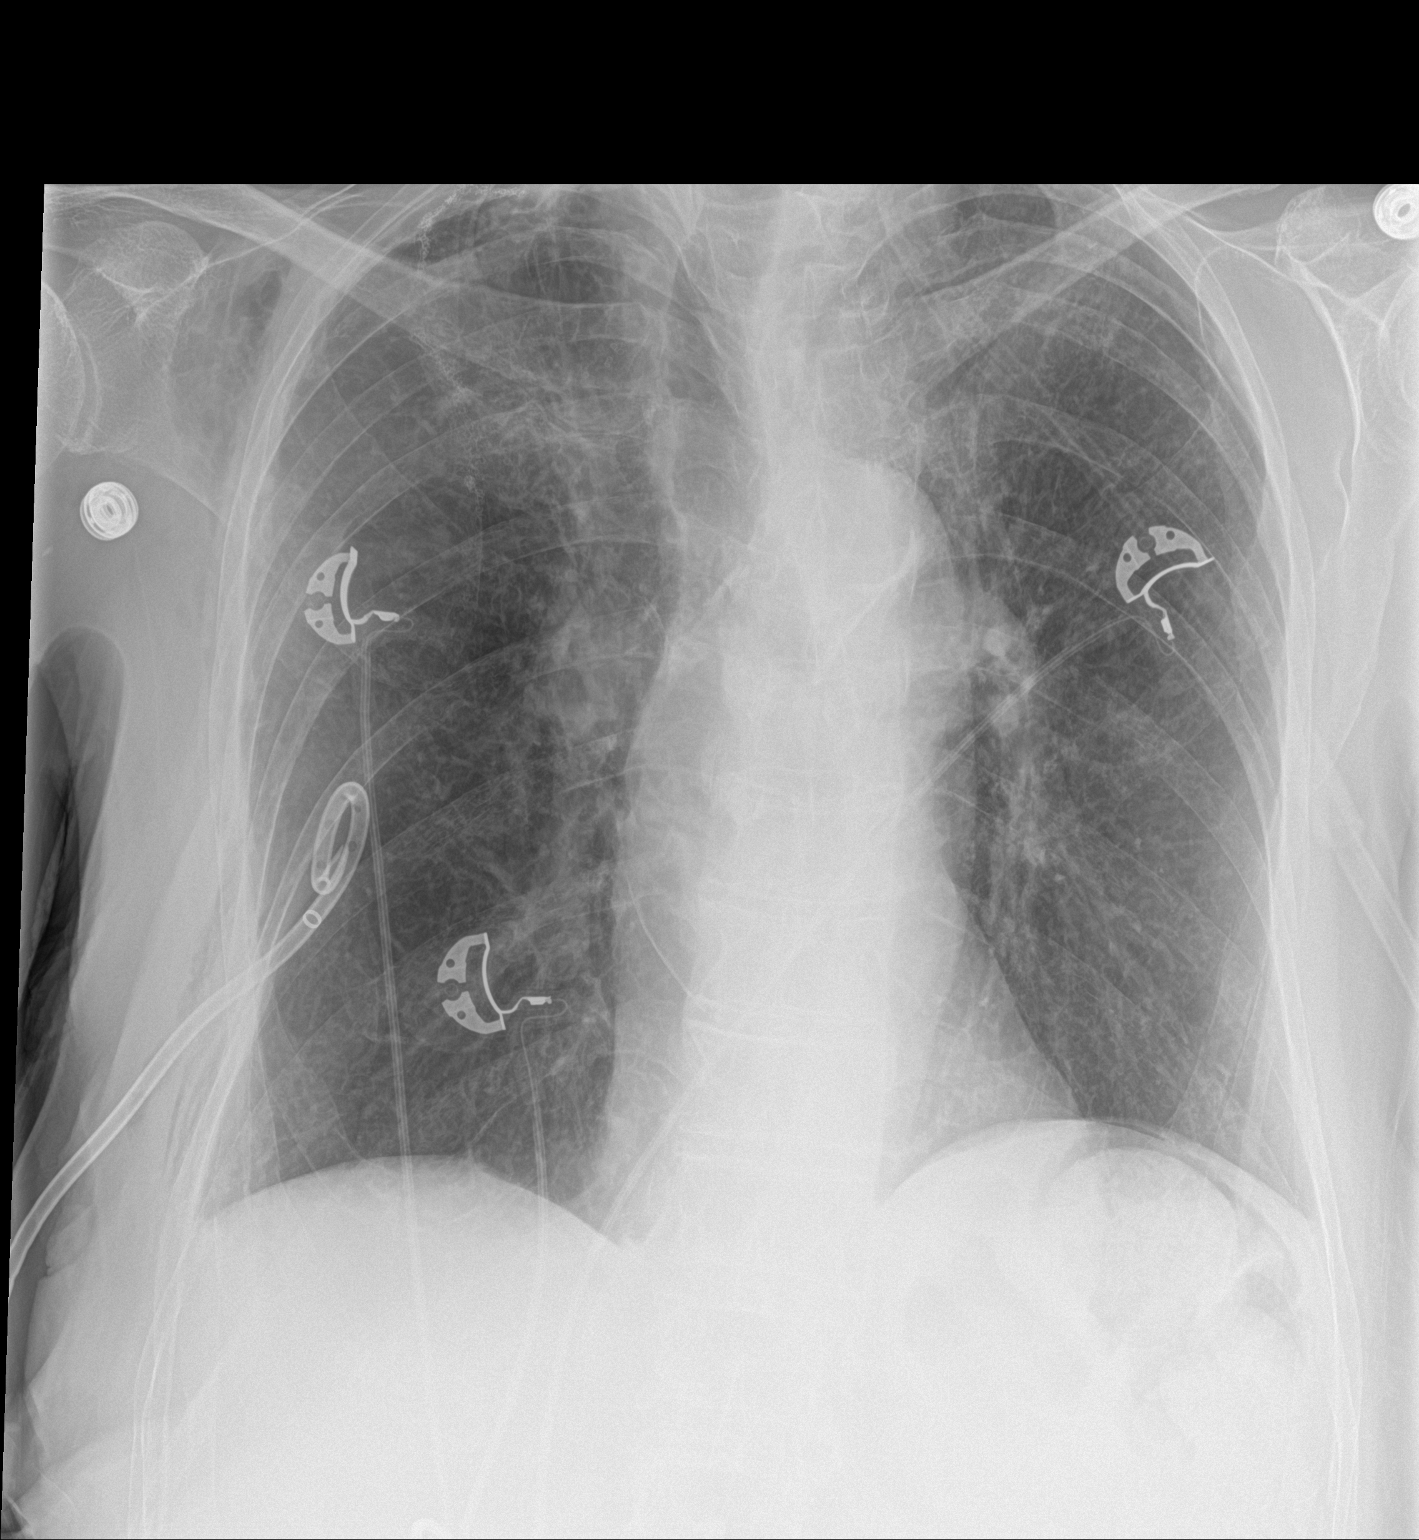

[1 of 1 positions shown; findings below may reference images not displayed]

FINDINGS: Chest drain remains on the right unchanged in position. No
pneumothorax apparent. There is postoperative change in the right
apex with right apical pleural thickening, stable. There is
subcutaneous air on the right, stable.

There is no edema or consolidation. The heart size and pulmonary
vascularity are normal. There is atherosclerotic change in aorta. No
adenopathy.
IMPRESSION: Postoperative change on the right with thickening in the right apex,
stable. No pneumothorax. Subcutaneous air on the right remains.
Chest drain on the right is unchanged in position. No new opacity.
No change in cardiac silhouette.

## 2017-04-10 IMAGING — CR DG CHEST 2V
2 series · 2 of 2 positions shown · non-contrast
Comparison: 11/27/2014

CLINICAL DATA: Weakness, shortness of breath, chest tube

EXAM:
CHEST  2 VIEW

[chest lat]
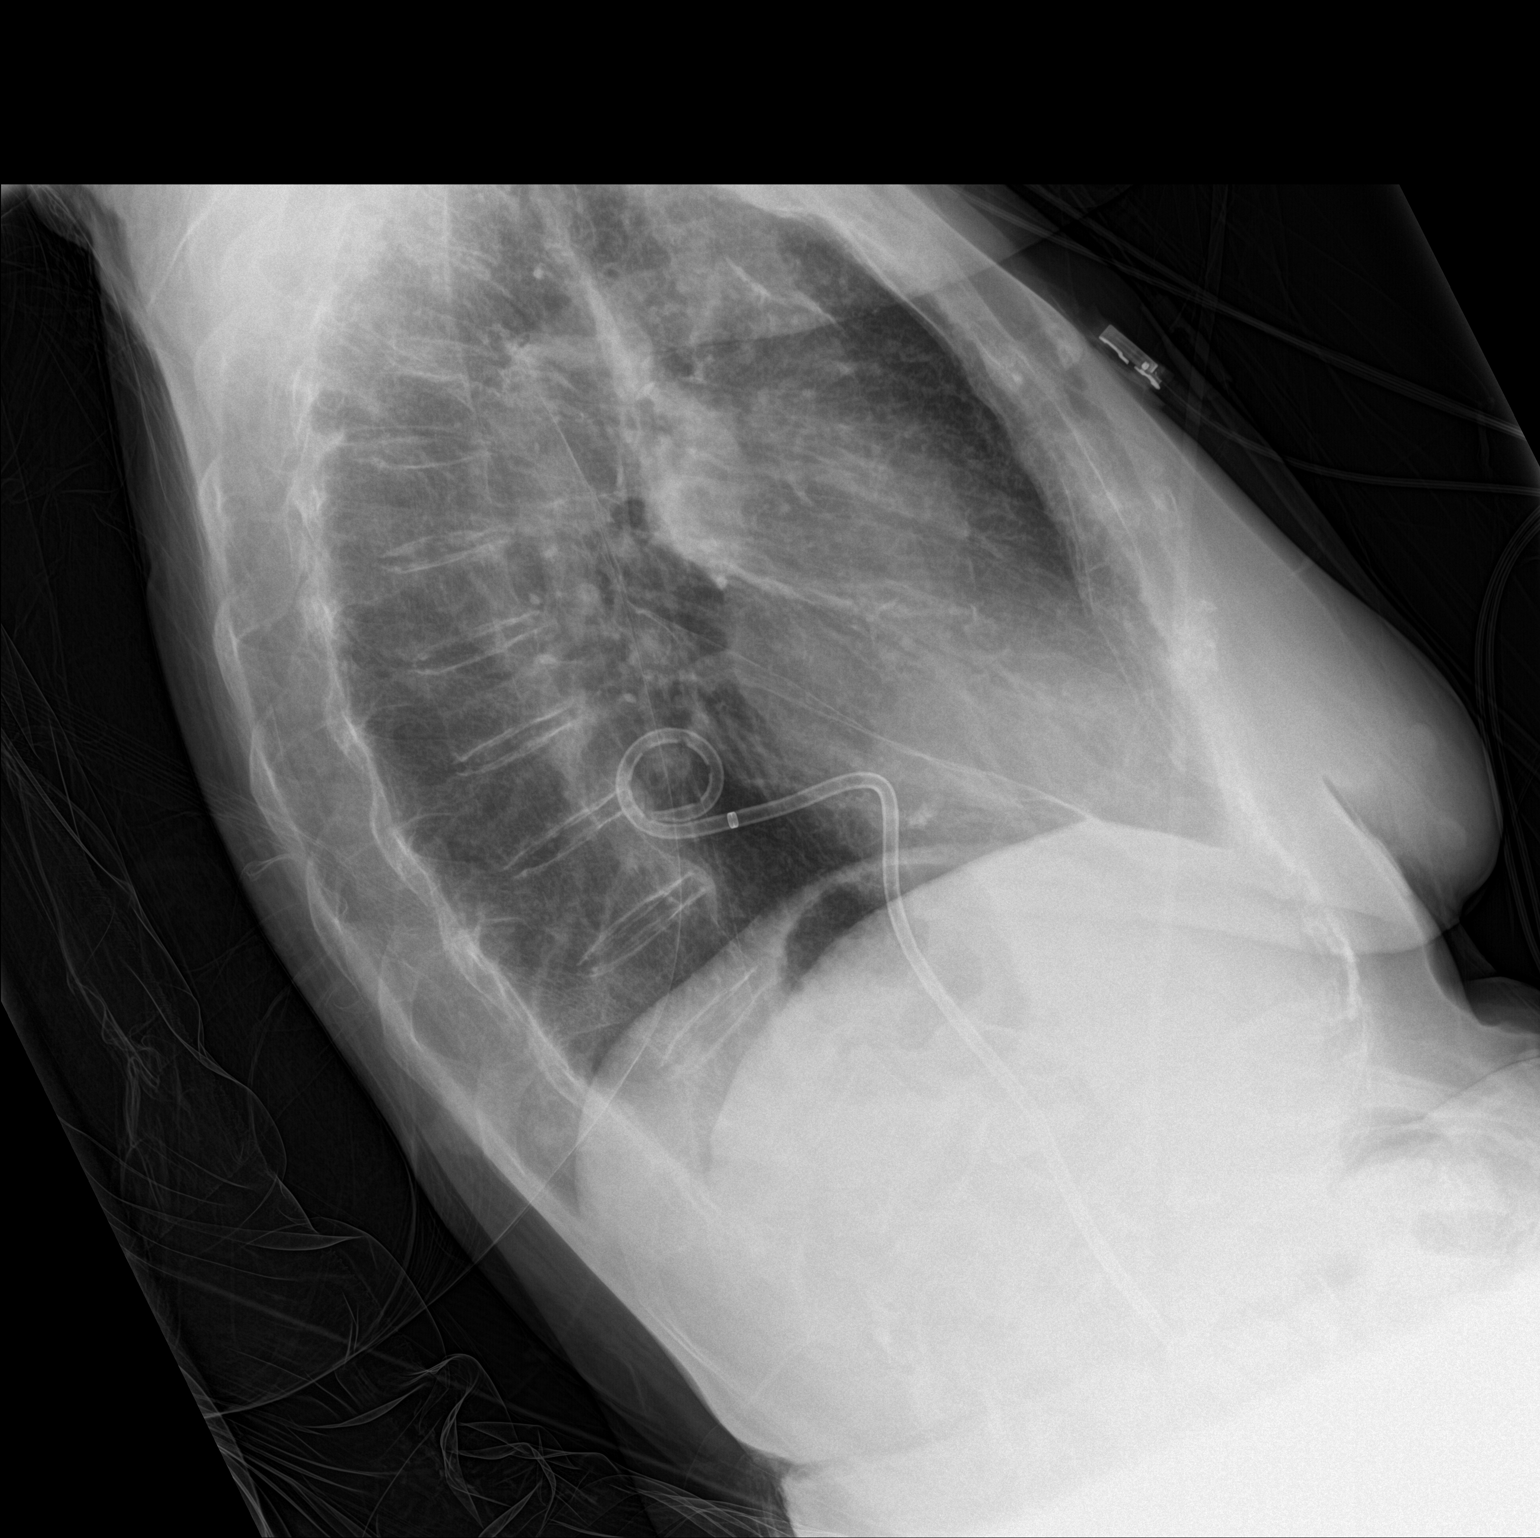

[chest ap]
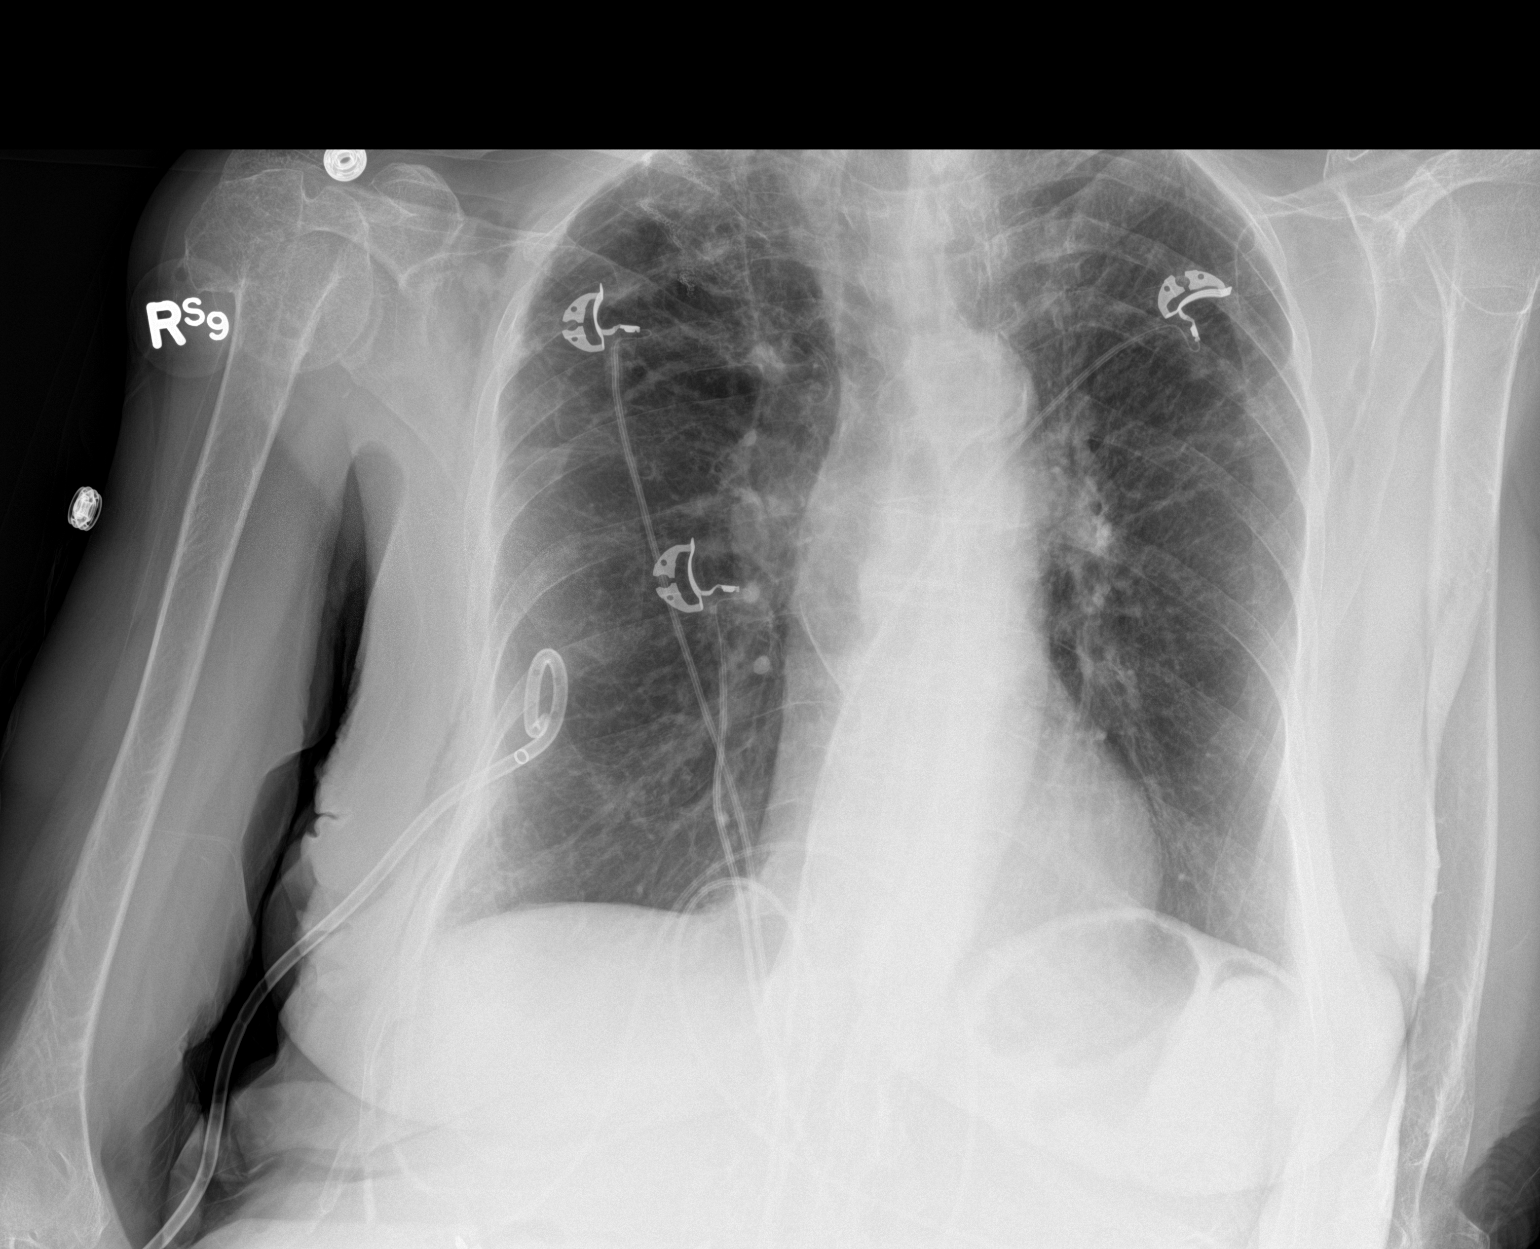

[2 of 2 positions shown; findings below may reference images not displayed]

FINDINGS: Pigtail RIGHT thoracostomy tube unchanged.

Normal heart size, mediastinal contours and pulmonary vascularity
normal.

Atherosclerotic calcification aorta.

Emphysematous changes with biapical scarring.

No pulmonary infiltrate, pleural effusion or pneumothorax.

Osseous demineralization with old post-traumatic deformity of the
proximal RIGHT humerus.
IMPRESSION: No active cardiopulmonary disease.

## 2017-04-16 IMAGING — CR DG CHEST 2V
2 series · 2 of 2 positions shown · non-contrast
Comparison: 11/28/2014

CLINICAL DATA: subsequent encounter right pneumothorax

EXAM:
CHEST  2 VIEW

[w chest lat]
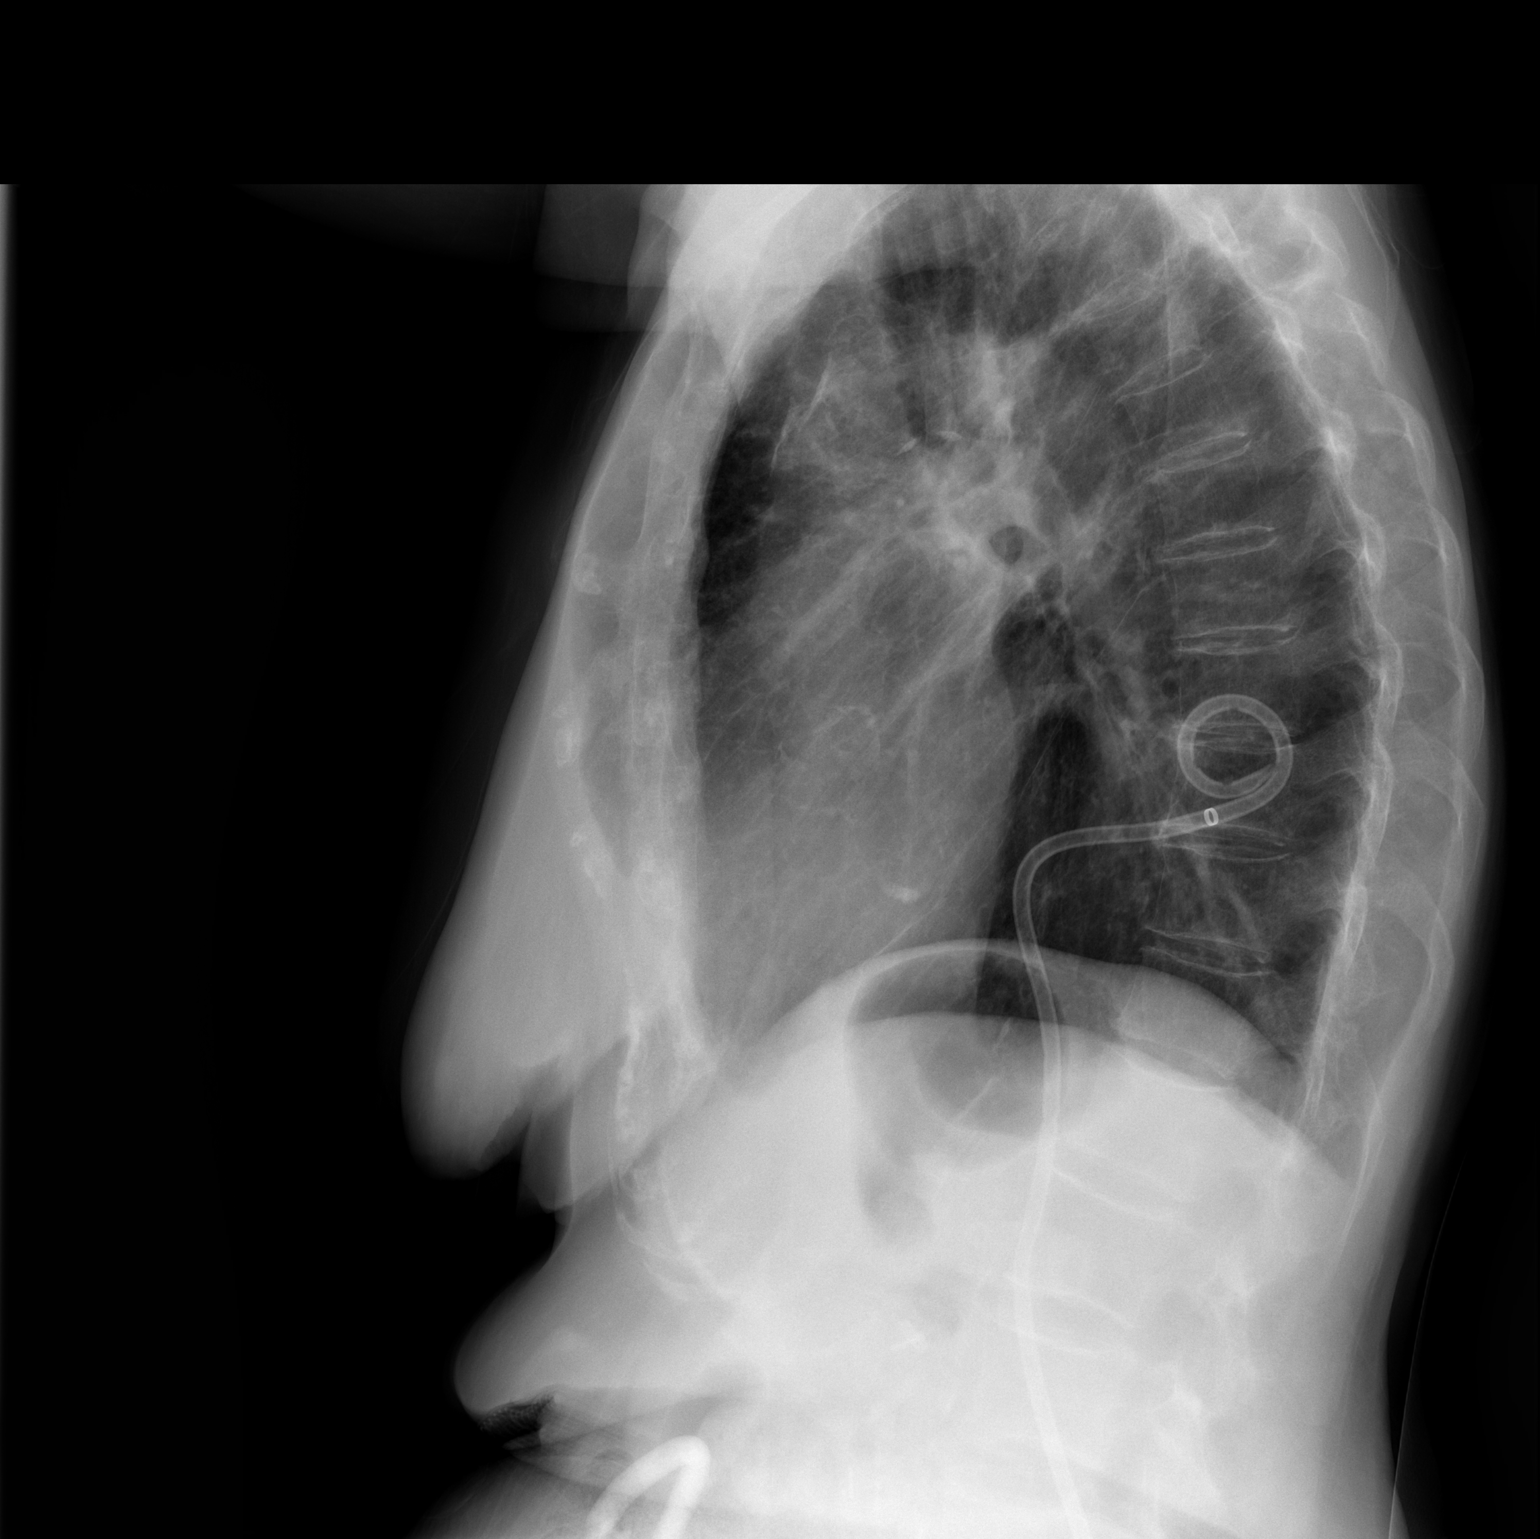

[w chest ap]
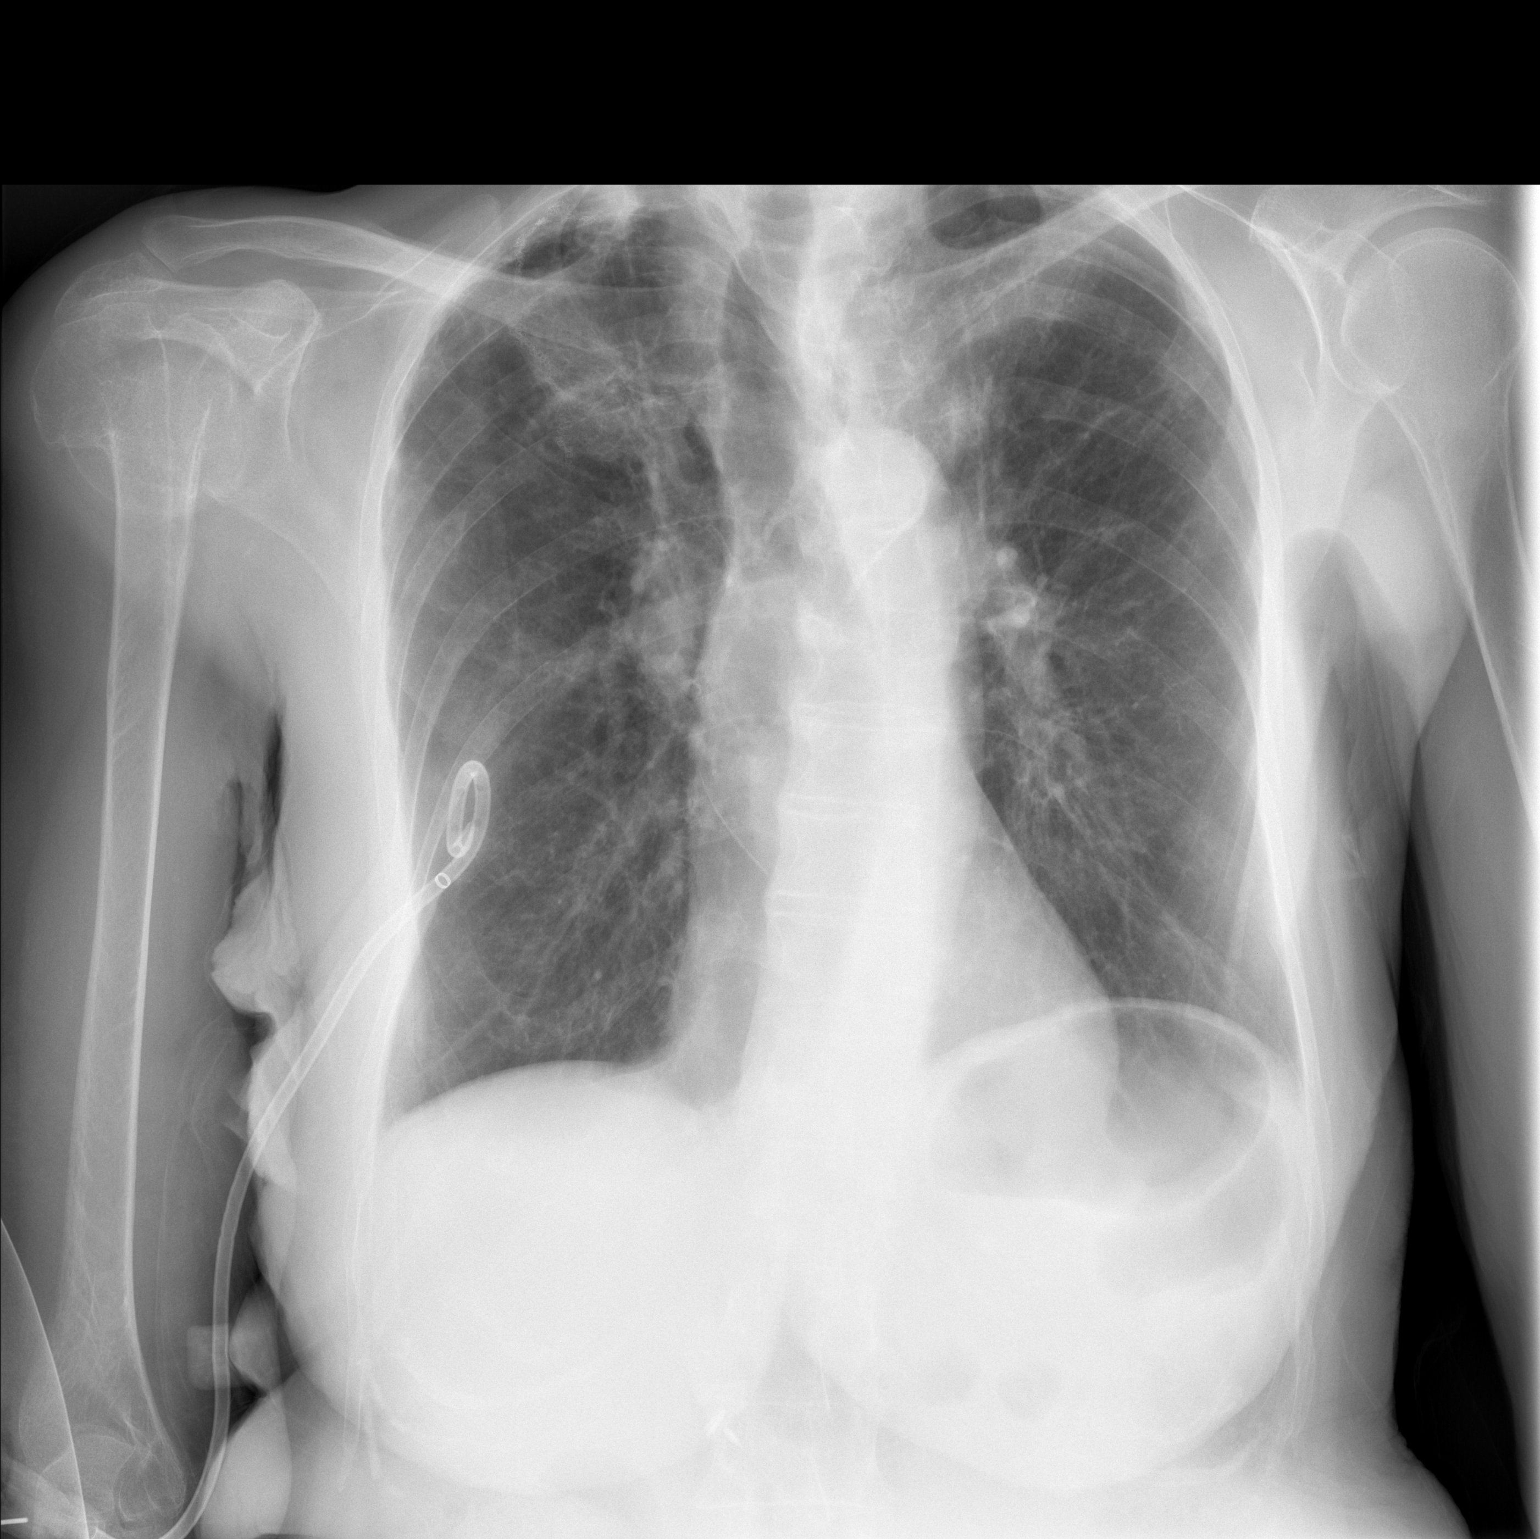

[2 of 2 positions shown; findings below may reference images not displayed]

FINDINGS: Right chest tube in unchanged position. Postsurgical change right
lung apex again noted with no pneumothorax currently seen.

Heart size and vascular pattern are normal. Left lung is clear. No
pleural effusion.
IMPRESSION: No acute findings.  No pneumothorax identified.

## 2017-04-23 IMAGING — CR DG CHEST 2V
2 series · 2 of 2 positions shown · non-contrast
Comparison: PA and lateral chest 11/28/2014 and 12/04/2014.

CLINICAL DATA: History right pneumothorax. Chest tube in place.
Productive cough today.

EXAM:
CHEST  2 VIEW

[view not recorded (1 of 2)]
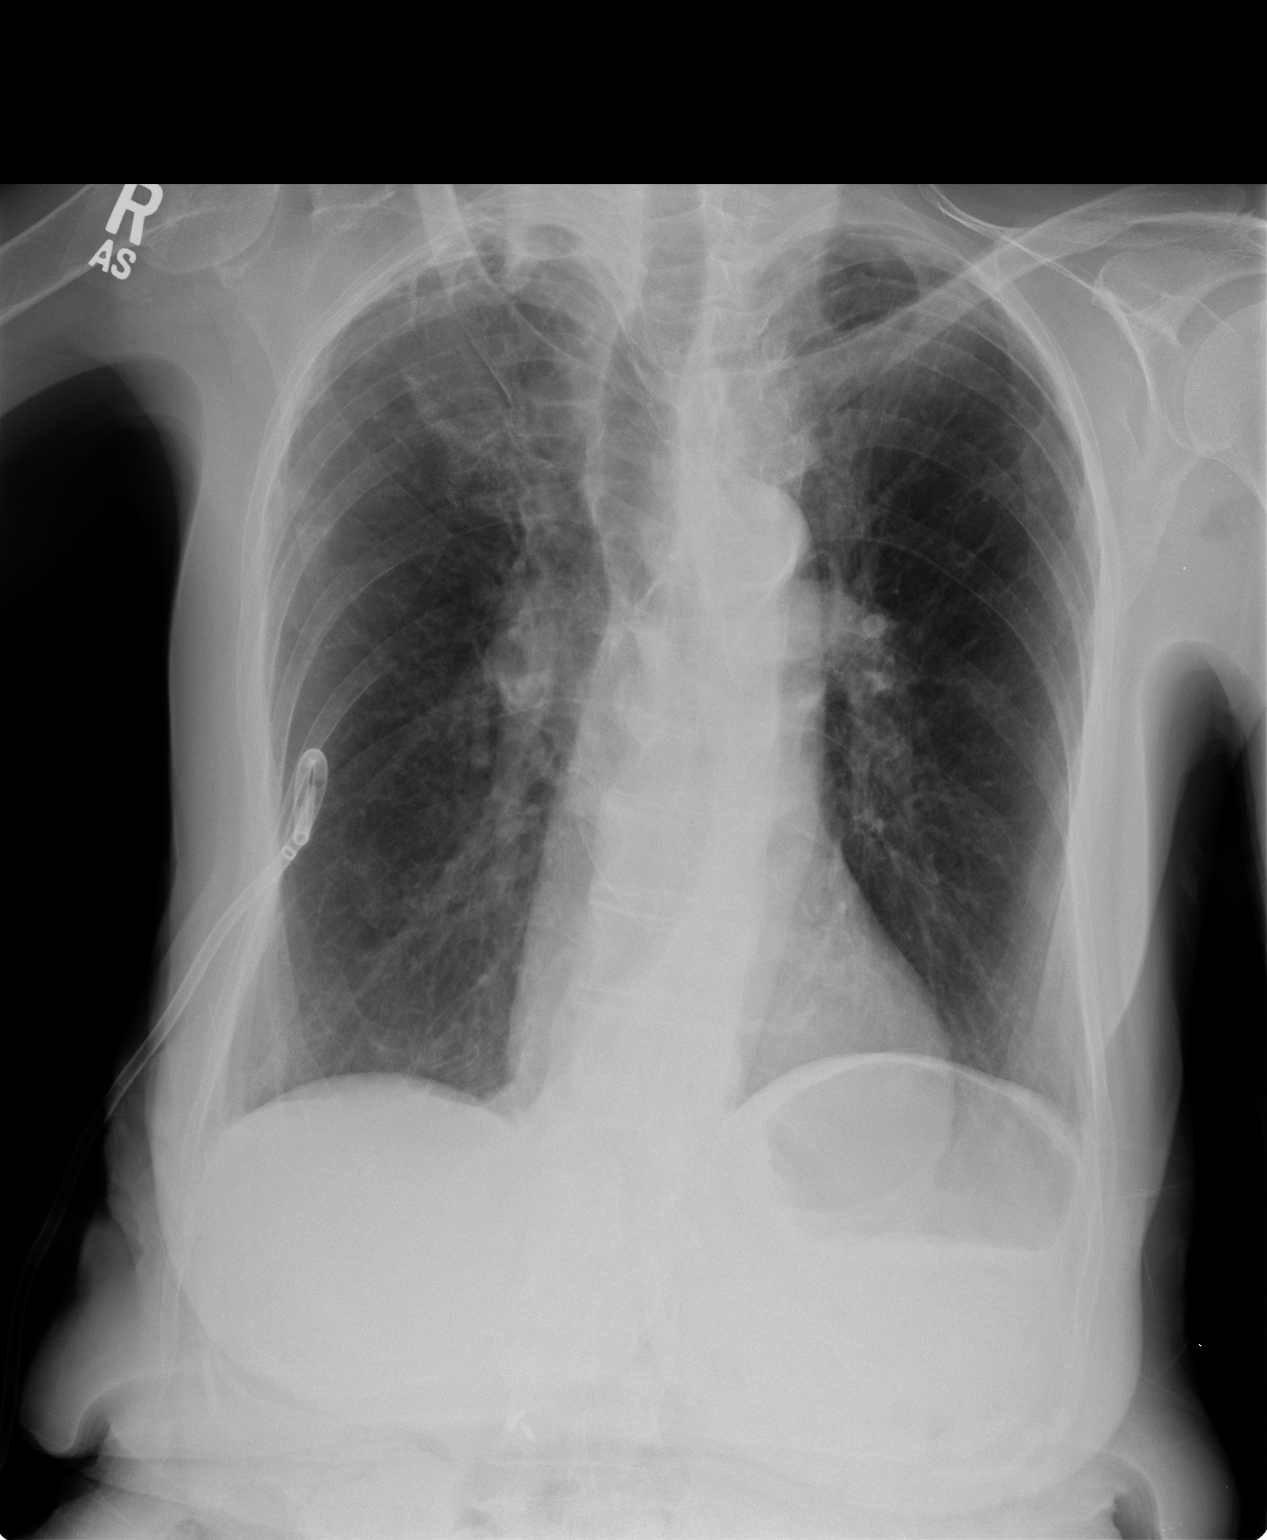

[view not recorded (2 of 2)]
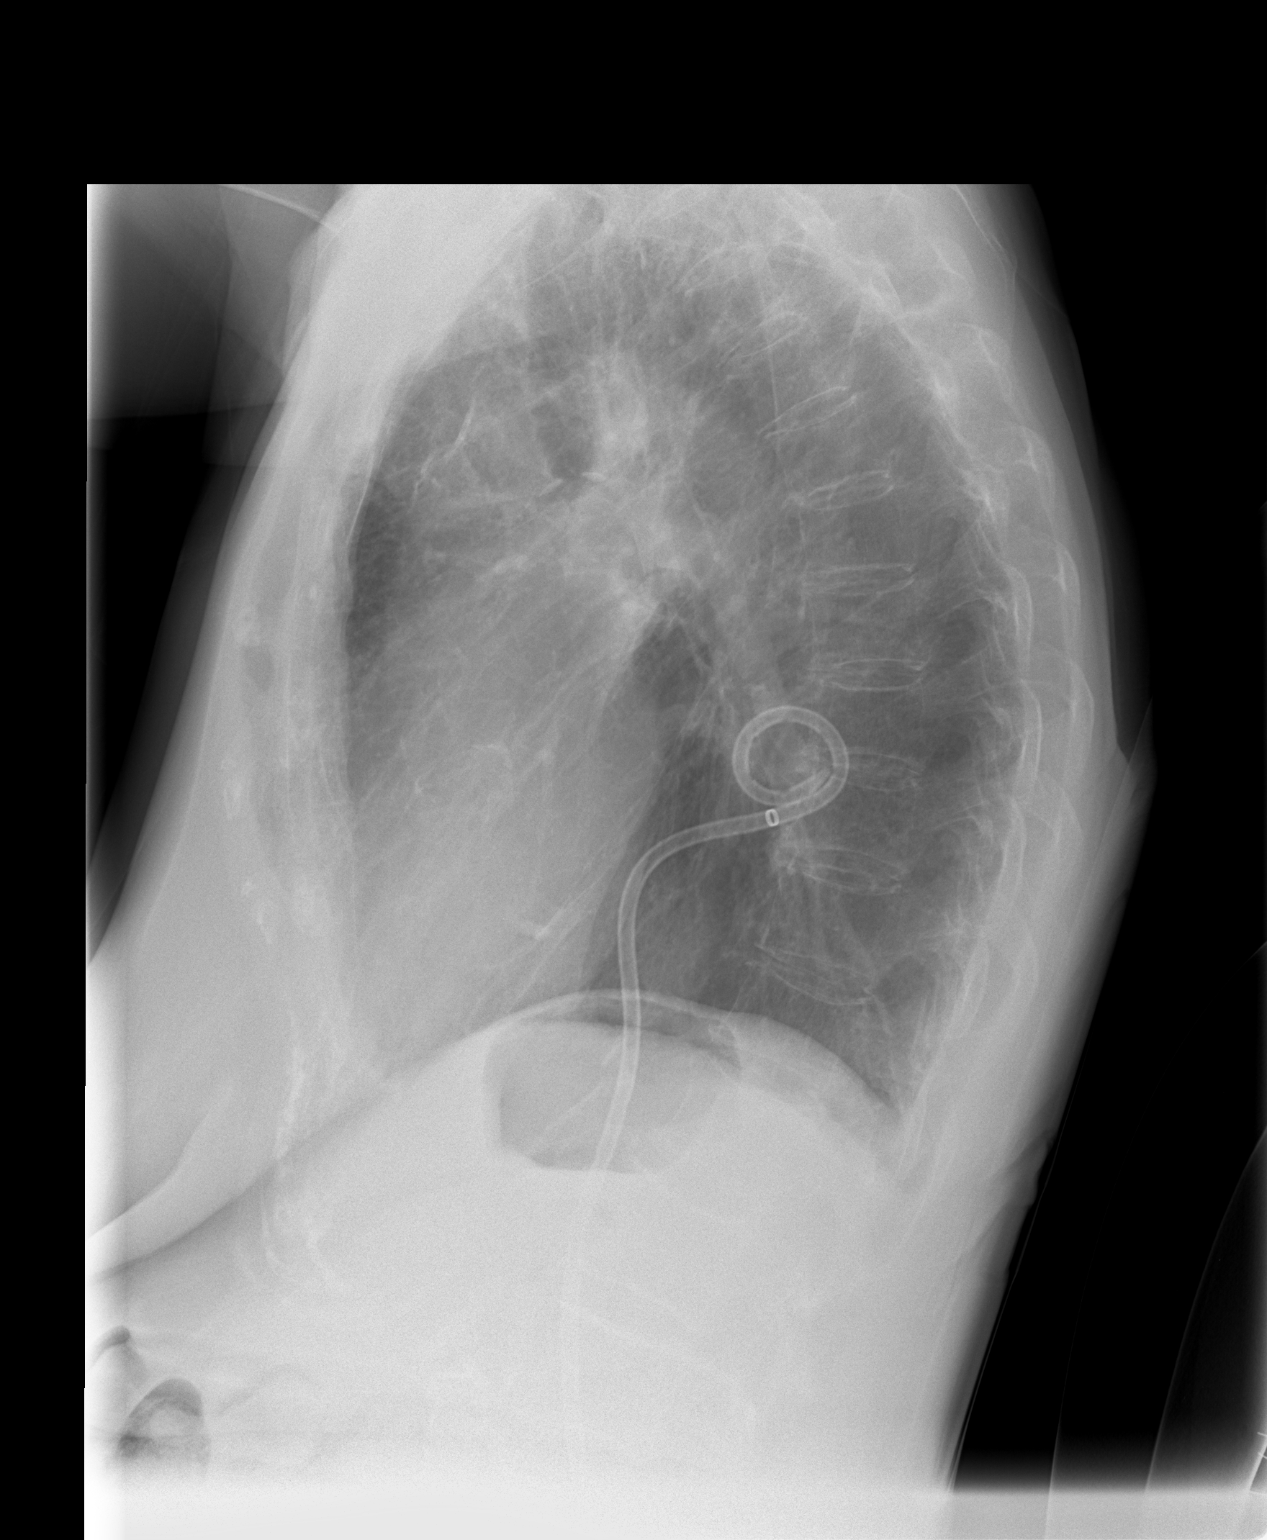

[2 of 2 positions shown; findings below may reference images not displayed]

FINDINGS: Pigtail catheter remains in place in the right chest. No
pneumothorax is identified. Biapical scarring is unchanged. No
consolidative process or pleural effusion is seen. Heart size is
normal. Convex left thoracic scoliosis noted.
IMPRESSION: No acute disease.

No pneumothorax is identified with a right chest tube in place.

## 2017-04-30 IMAGING — CR DG CHEST 2V
2 series · 2 of 2 positions shown · non-contrast
Comparison: 12/11/2014.  12/04/2014.  09/18/2014.

CLINICAL DATA: History of pneumothorax.  Pain.

EXAM:
CHEST  2 VIEW

[w chest pa]
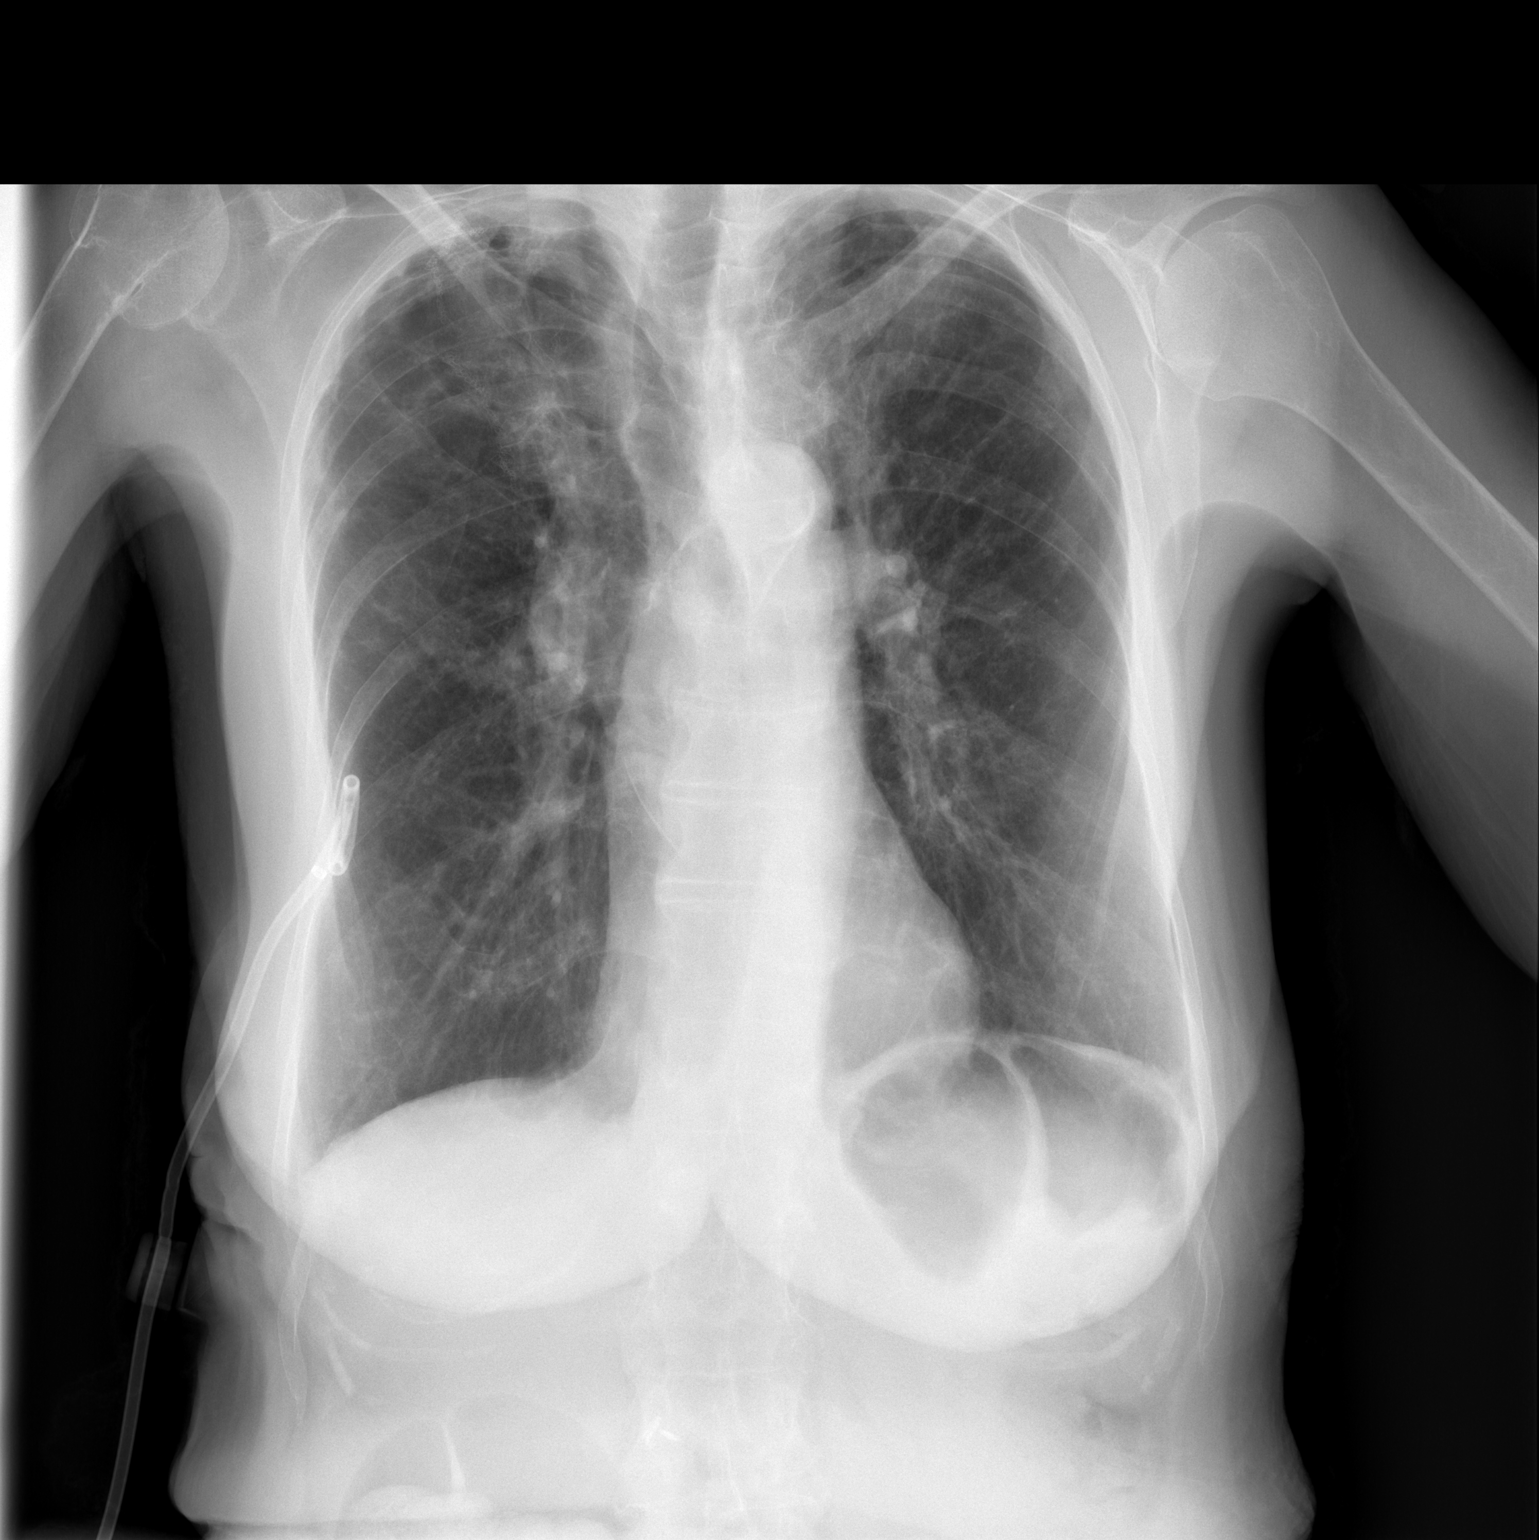

[w chest lat]
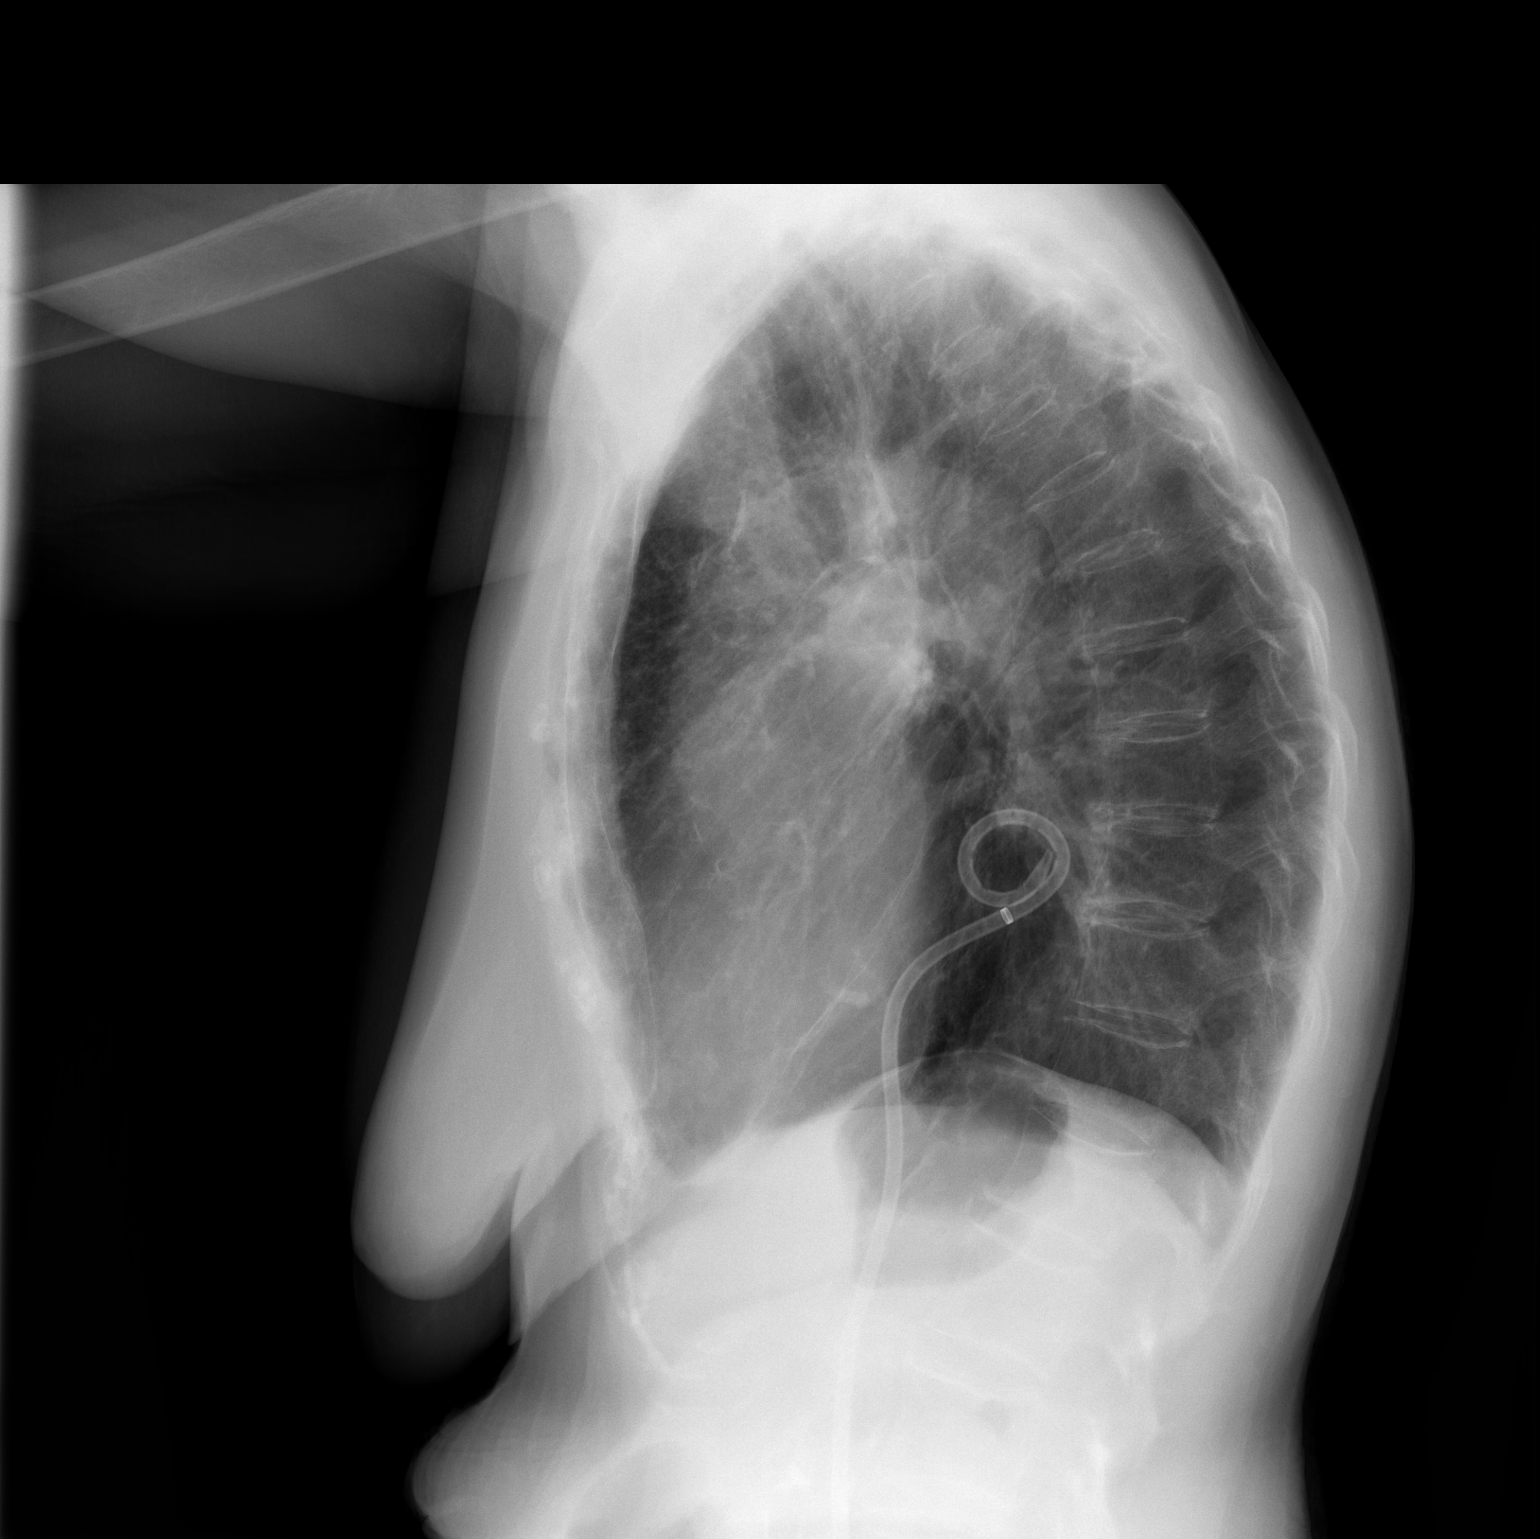

[2 of 2 positions shown; findings below may reference images not displayed]

FINDINGS: Right chest tube noted in good anatomic position. Surgical chain
sutures noted in the right pulmonary apex. Apical pleural
parenchymal thickening consistent with scarring noted. No definite
pneumothorax. Left apical pleural parenchymal thickening also noted
consistent with scarring. Heart size normal.
IMPRESSION: 1. Right chest tube in stable position.

2. No evidence of pneumothorax. Biapical pleural parenchymal
thickening noted consistent with scarring.

## 2017-06-04 IMAGING — CR DG CHEST 2V
2 series · 2 of 2 positions shown · non-contrast
Comparison: 12/18/2014 .

CLINICAL DATA: Follow-up pneumothorax.

EXAM:
CHEST  2 VIEW

[view not recorded (1 of 2)]
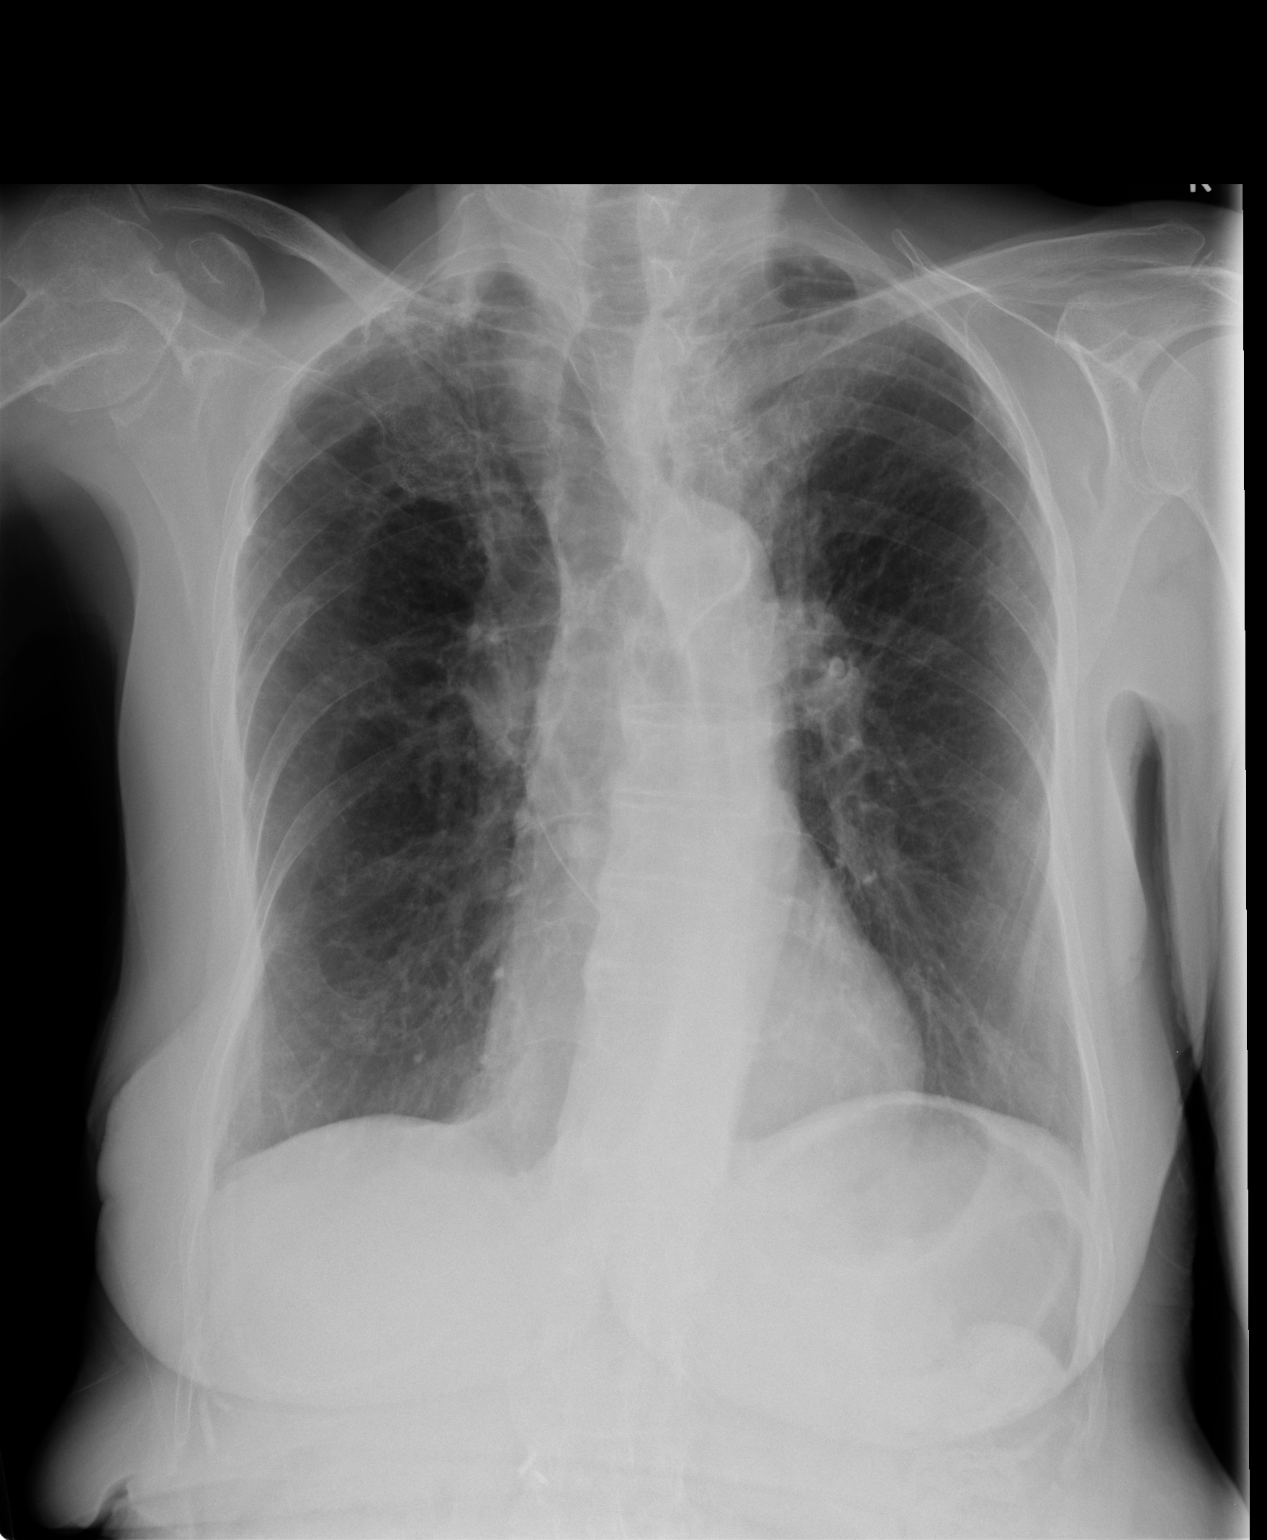

[view not recorded (2 of 2)]
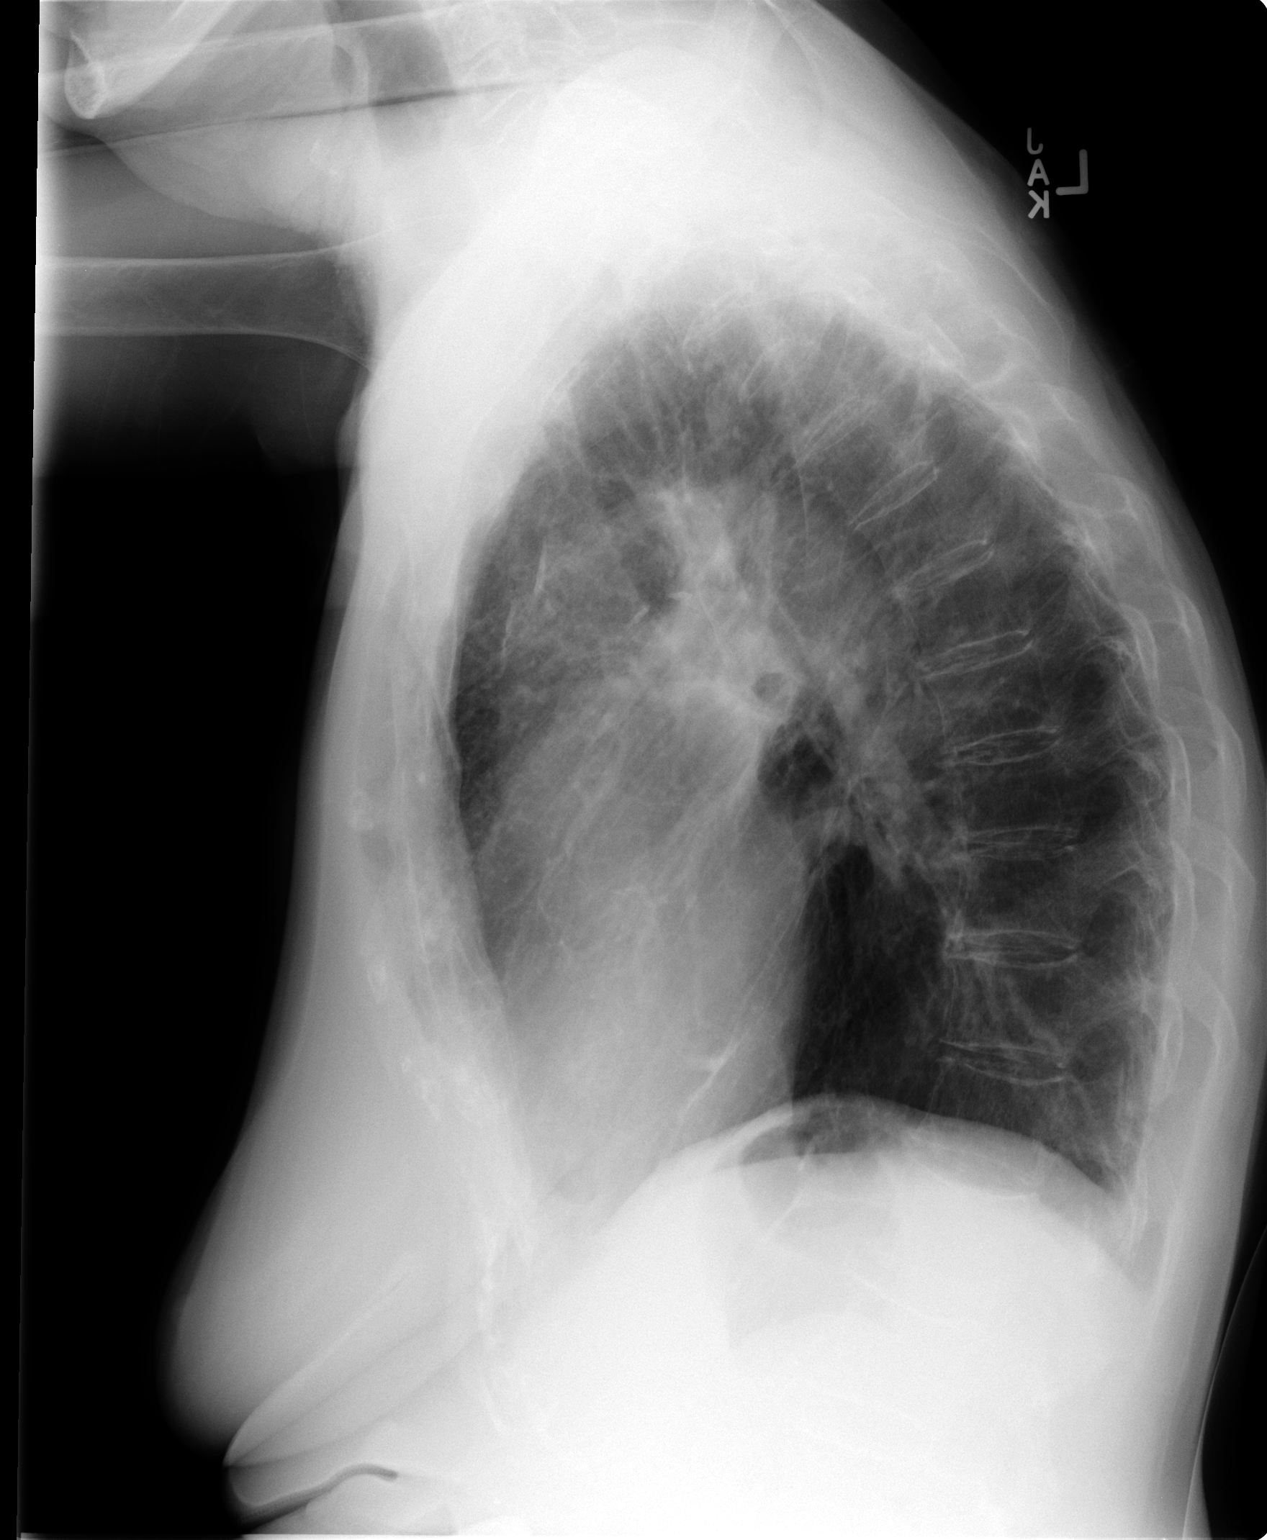

[2 of 2 positions shown; findings below may reference images not displayed]

FINDINGS: Right chest tube is been removed. No significant pneumothorax noted.
Surgical cyst sutures right upper chest. No focal infiltrate. Heart
size stable. No pleural effusion. Degenerative changes and scoliosis
thoracic spine . New stable deformity right shoulder .
IMPRESSION: Interim removal of right chest tube. No pneumothorax. Postsurgical
changes and pleural-parenchymal scar in in the right apex.

## 2018-01-03 IMAGING — CR DG CHEST 2V
2 series · 2 of 2 positions shown · non-contrast
Comparison: 01/22/2015

CLINICAL DATA: COPD exacerbation.  Dyspnea.

EXAM:
CHEST  2 VIEW

[chest lat]
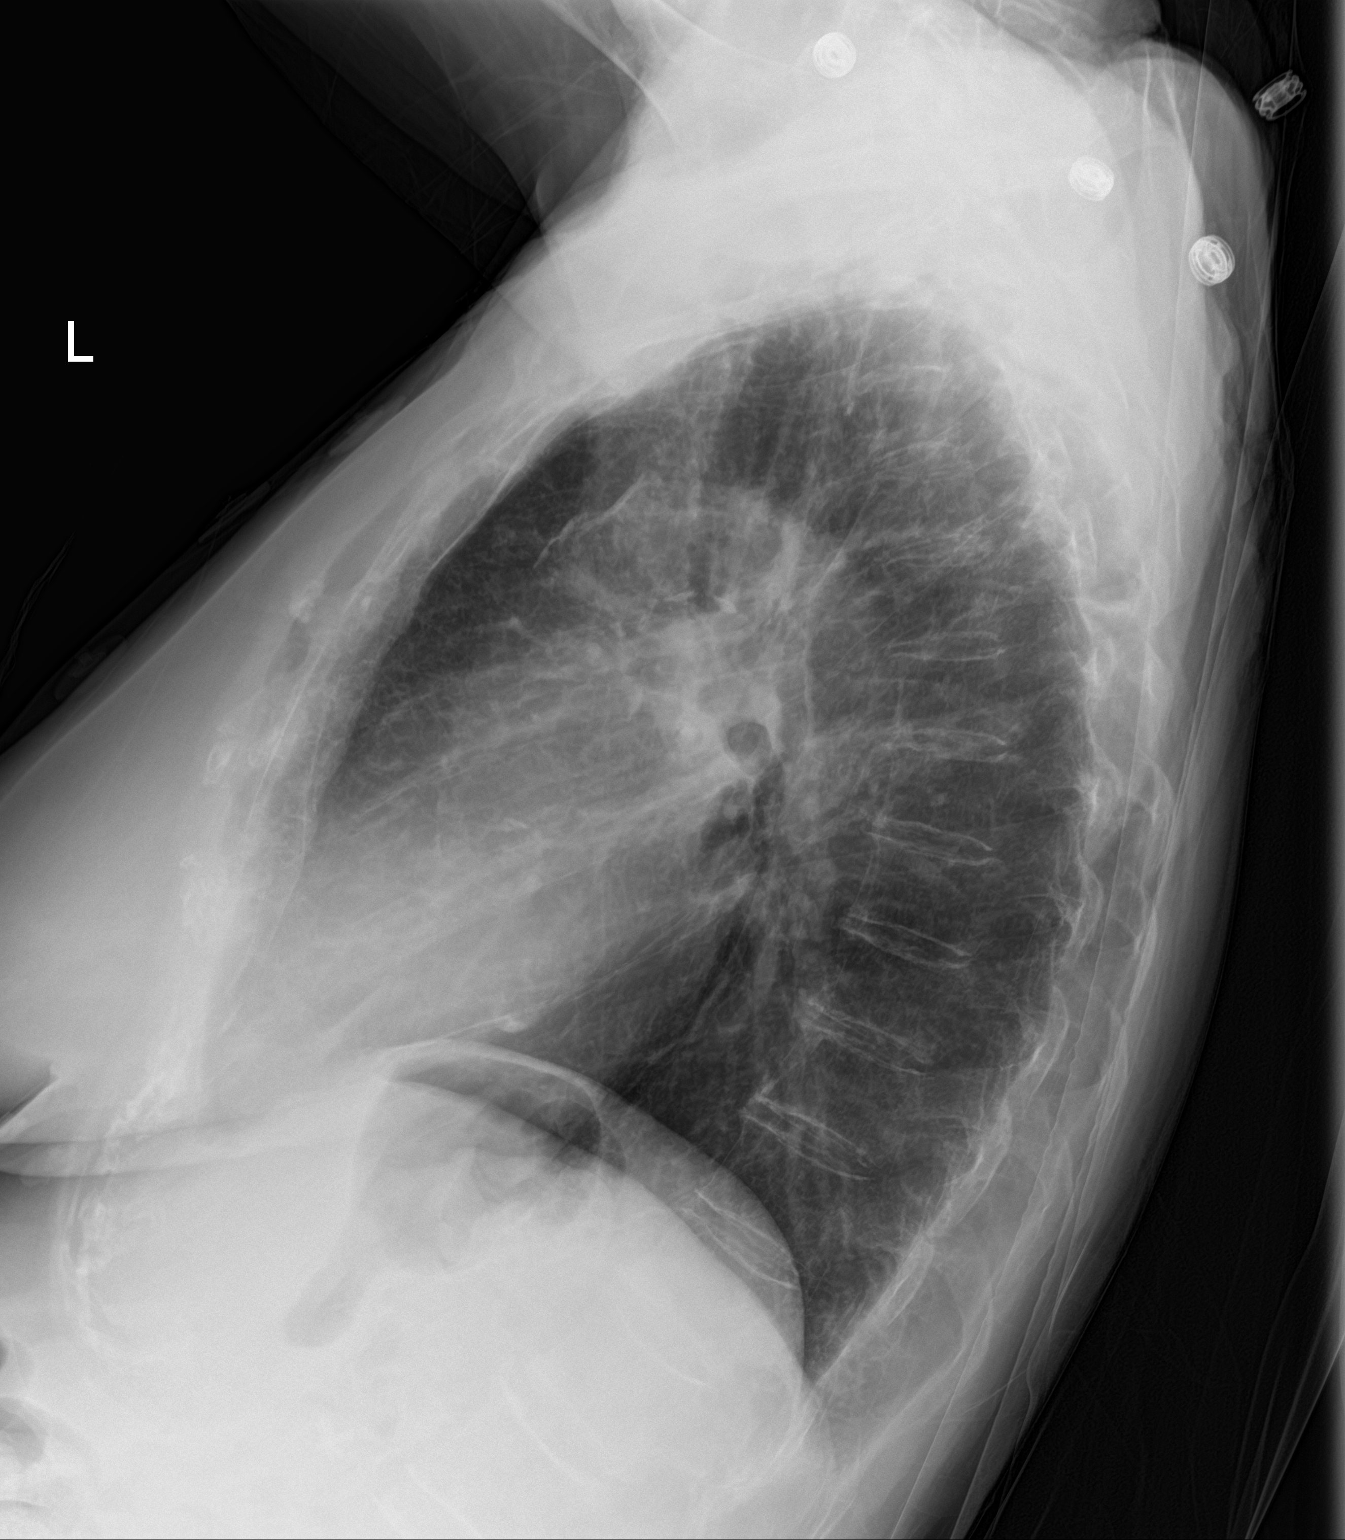

[chest ap]
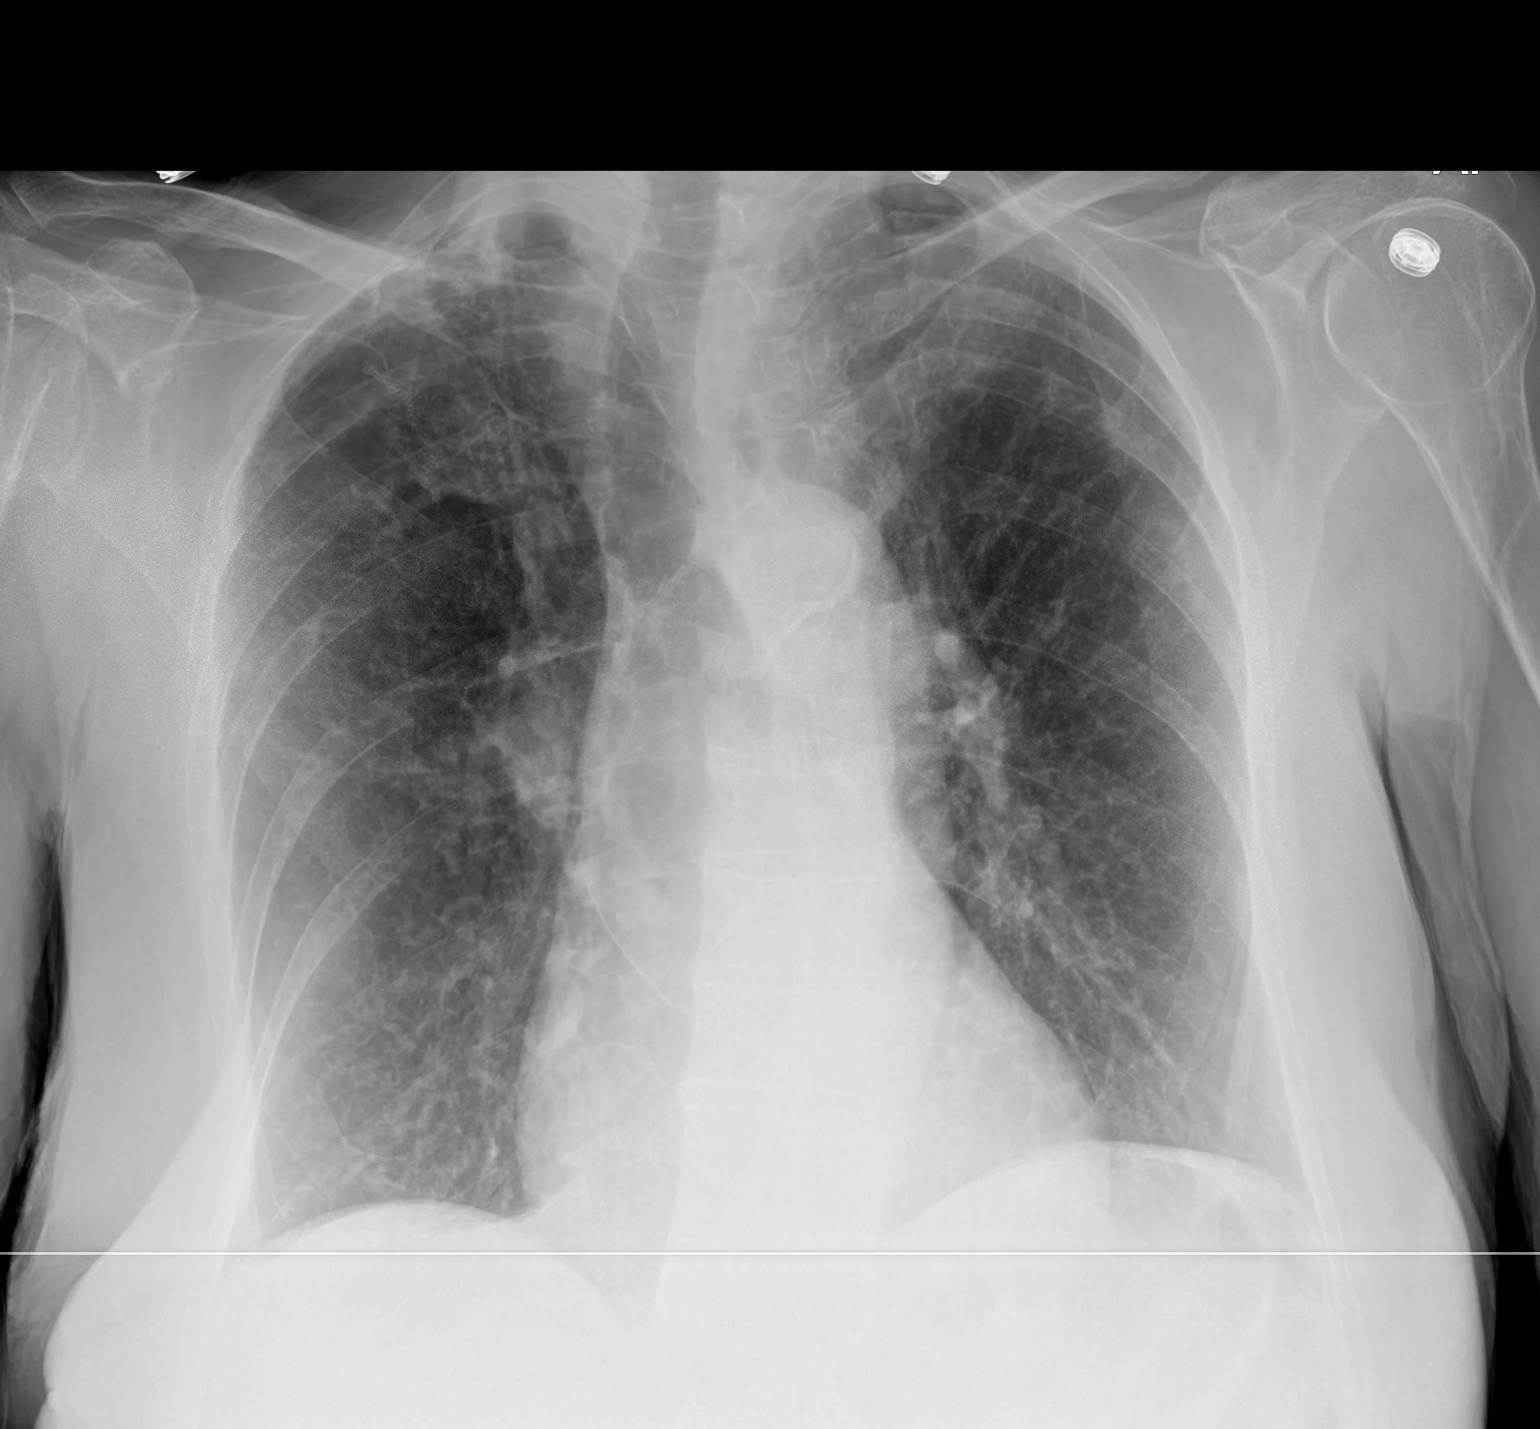

[2 of 2 positions shown; findings below may reference images not displayed]

FINDINGS: Hyperinflation. Midline trachea. Normal heart size. Tortuous
thoracic aorta. Atherosclerosis in the transverse aorta. No pleural
effusion or pneumothorax. Right worse than left apical pleural
parenchymal scarring again identified. No superimposed pulmonary
opacity identified.
IMPRESSION: No acute cardiopulmonary disease.

Hyperinflation with biapical pleural-parenchymal scarring.

Aortic atherosclerosis.

## 2018-01-10 IMAGING — DX DG CHEST 2V
2 series · 2 of 2 positions shown · non-contrast
Comparison: Radiograph August 23, 2015.

CLINICAL DATA: Shortness of breath.

EXAM:
CHEST  2 VIEW

[x chest ap]
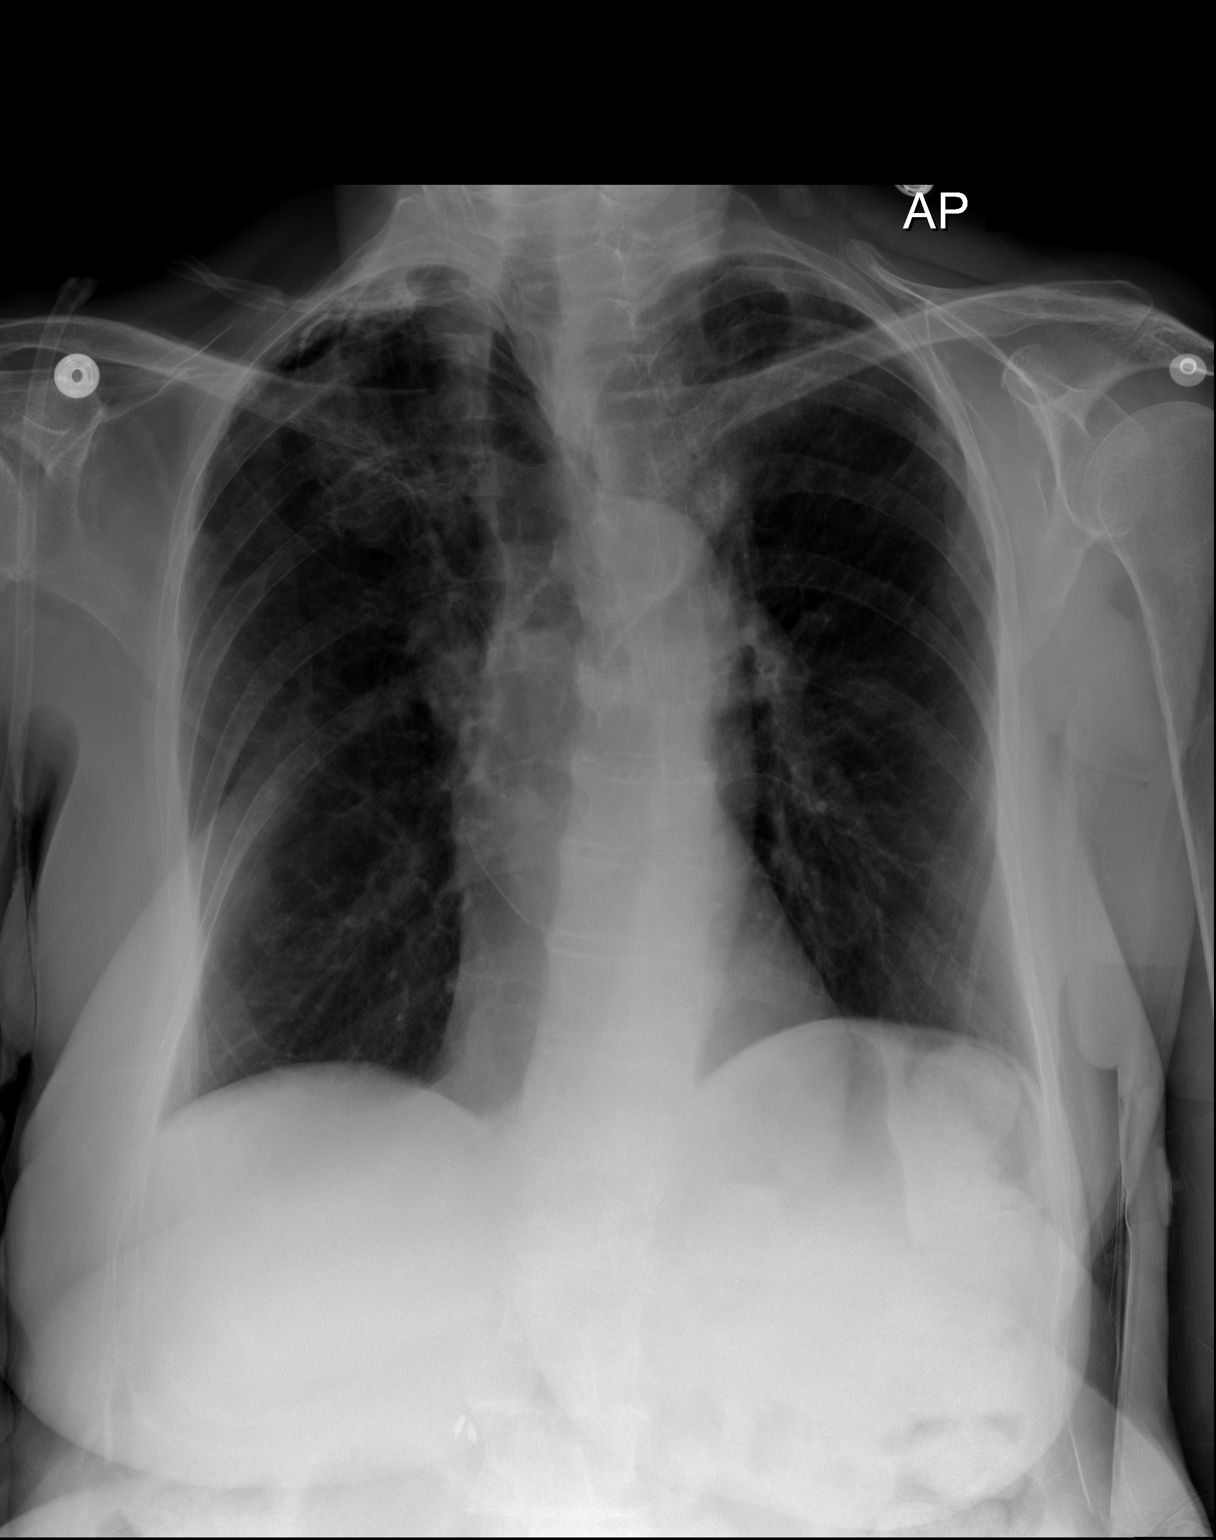

[w chest lat]
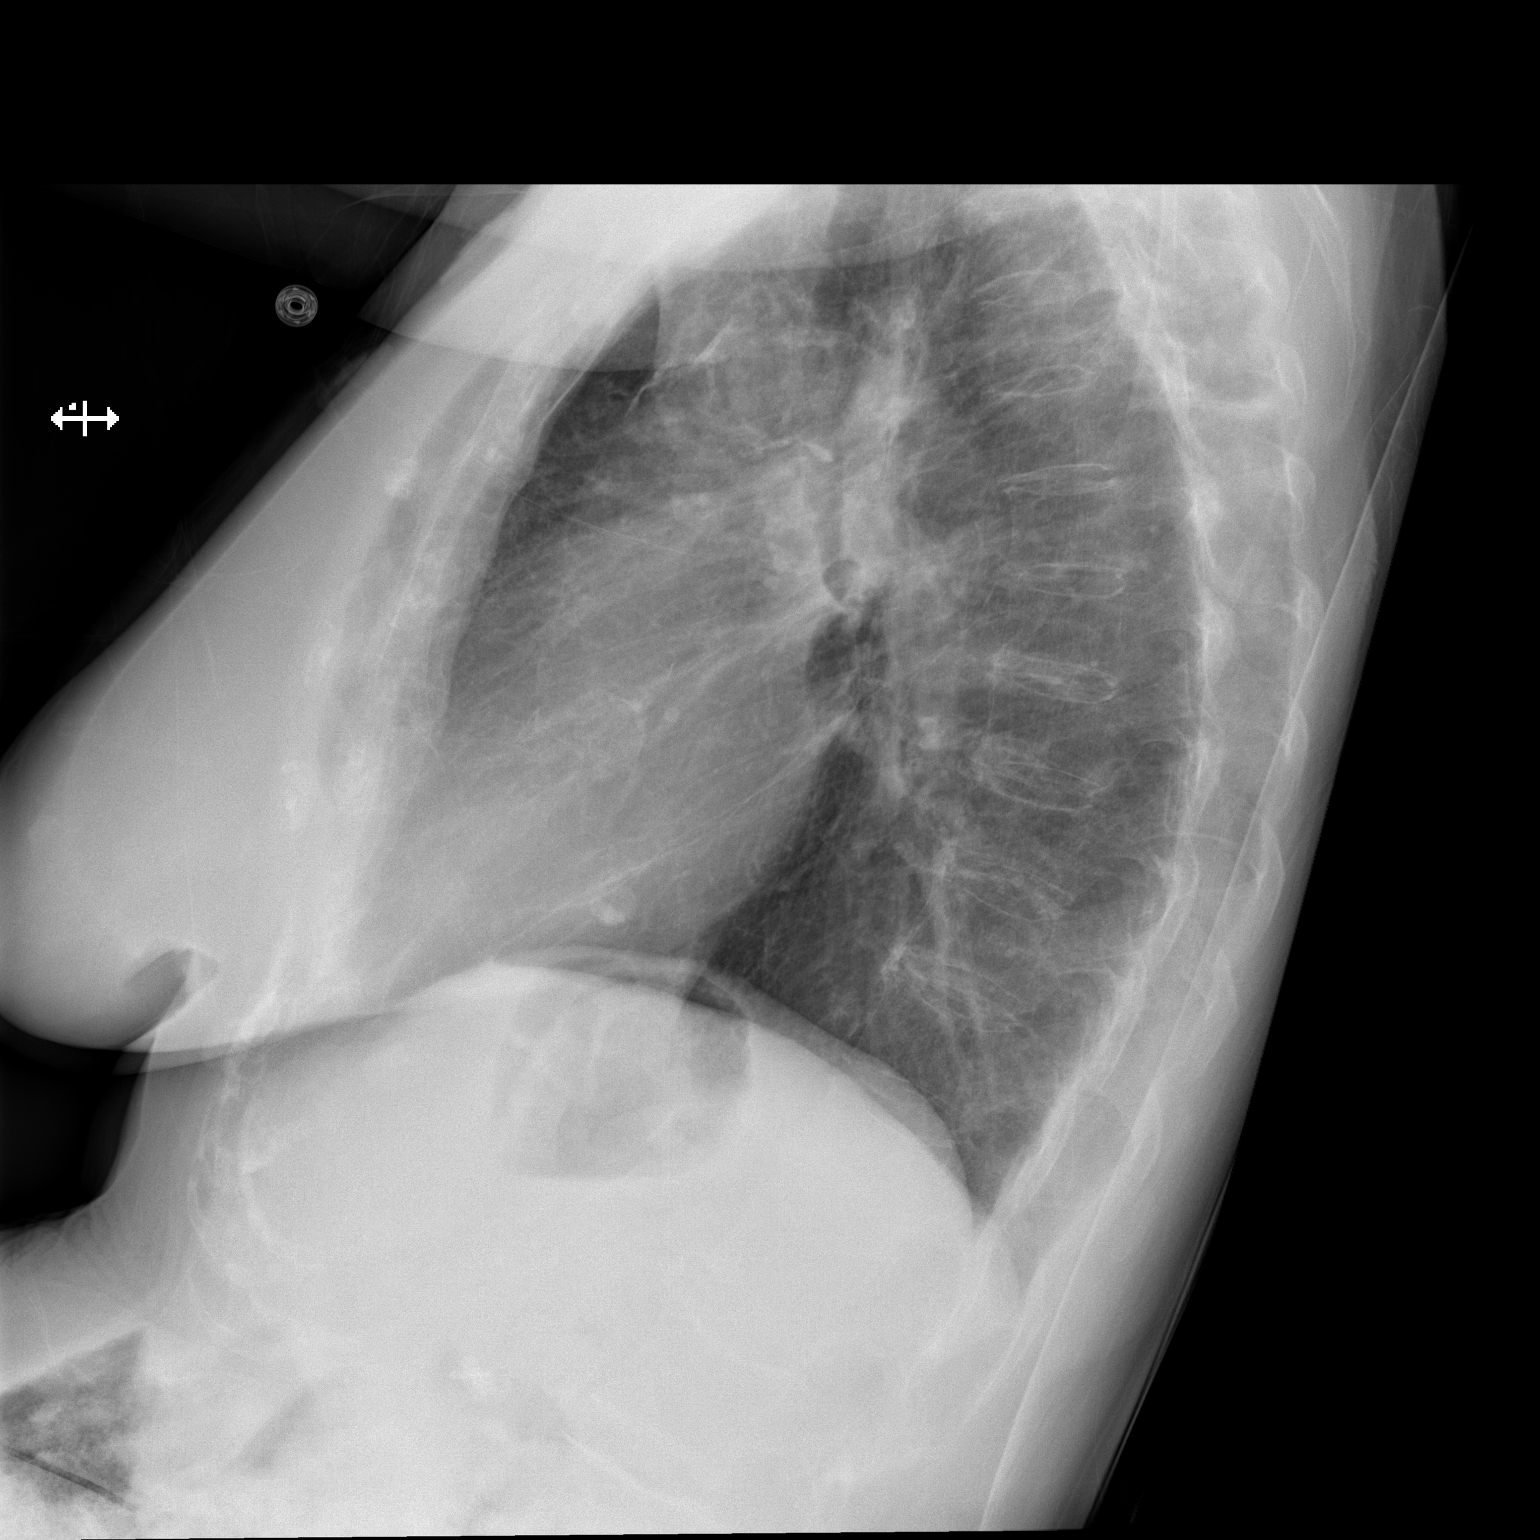

[2 of 2 positions shown; findings below may reference images not displayed]

FINDINGS: The heart size and mediastinal contours are within normal limits. No
pneumothorax or pleural effusion is noted. Left lung is clear.
Stable right apical scarring and postoperative change is noted. No
acute pulmonary disease is noted. Atherosclerosis of thoracic aorta
is noted. The visualized skeletal structures are unremarkable.
IMPRESSION: No active cardiopulmonary disease.

## 2018-02-03 IMAGING — DX DG CHEST 2V
2 series · 2 of 2 positions shown · non-contrast
Comparison: 08/30/2015.

CLINICAL DATA: Shortness of breath and wheezing for the past 2
weeks.

EXAM:
CHEST  2 VIEW

[chest pa]
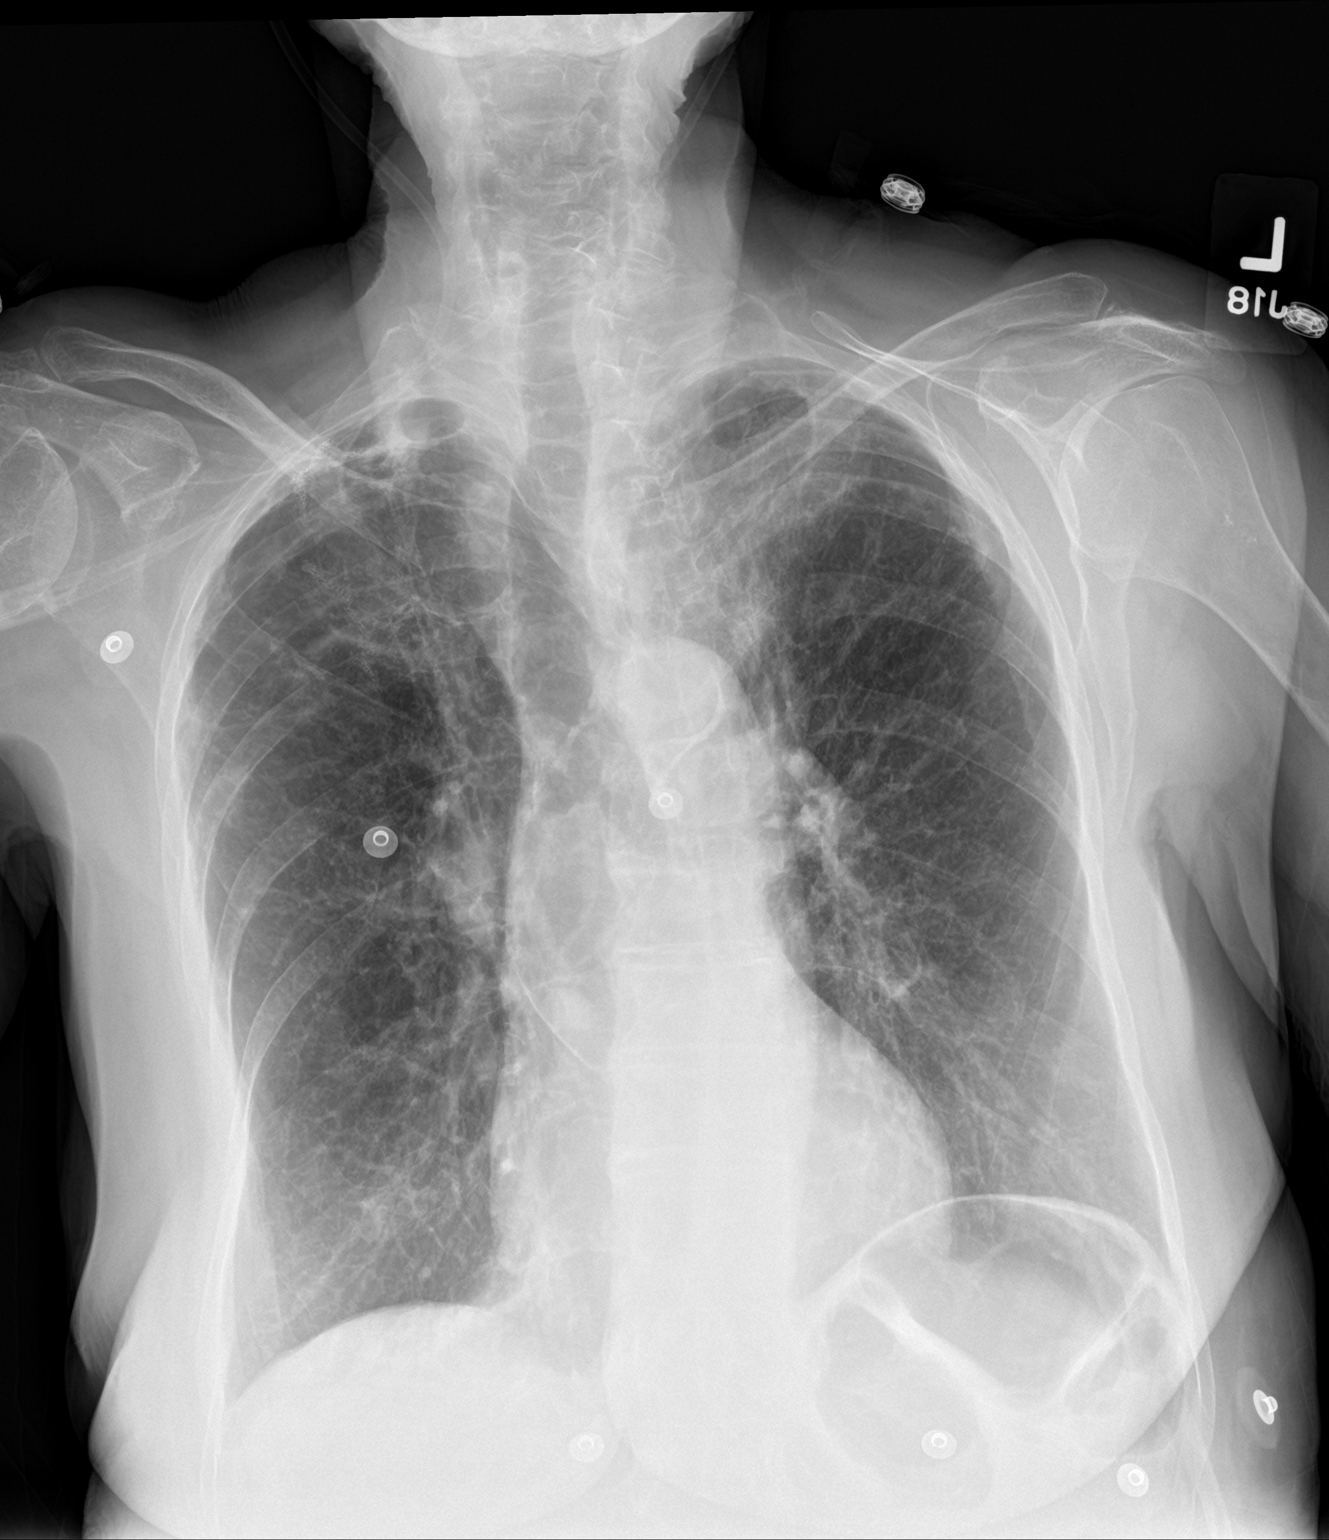

[chest lat]
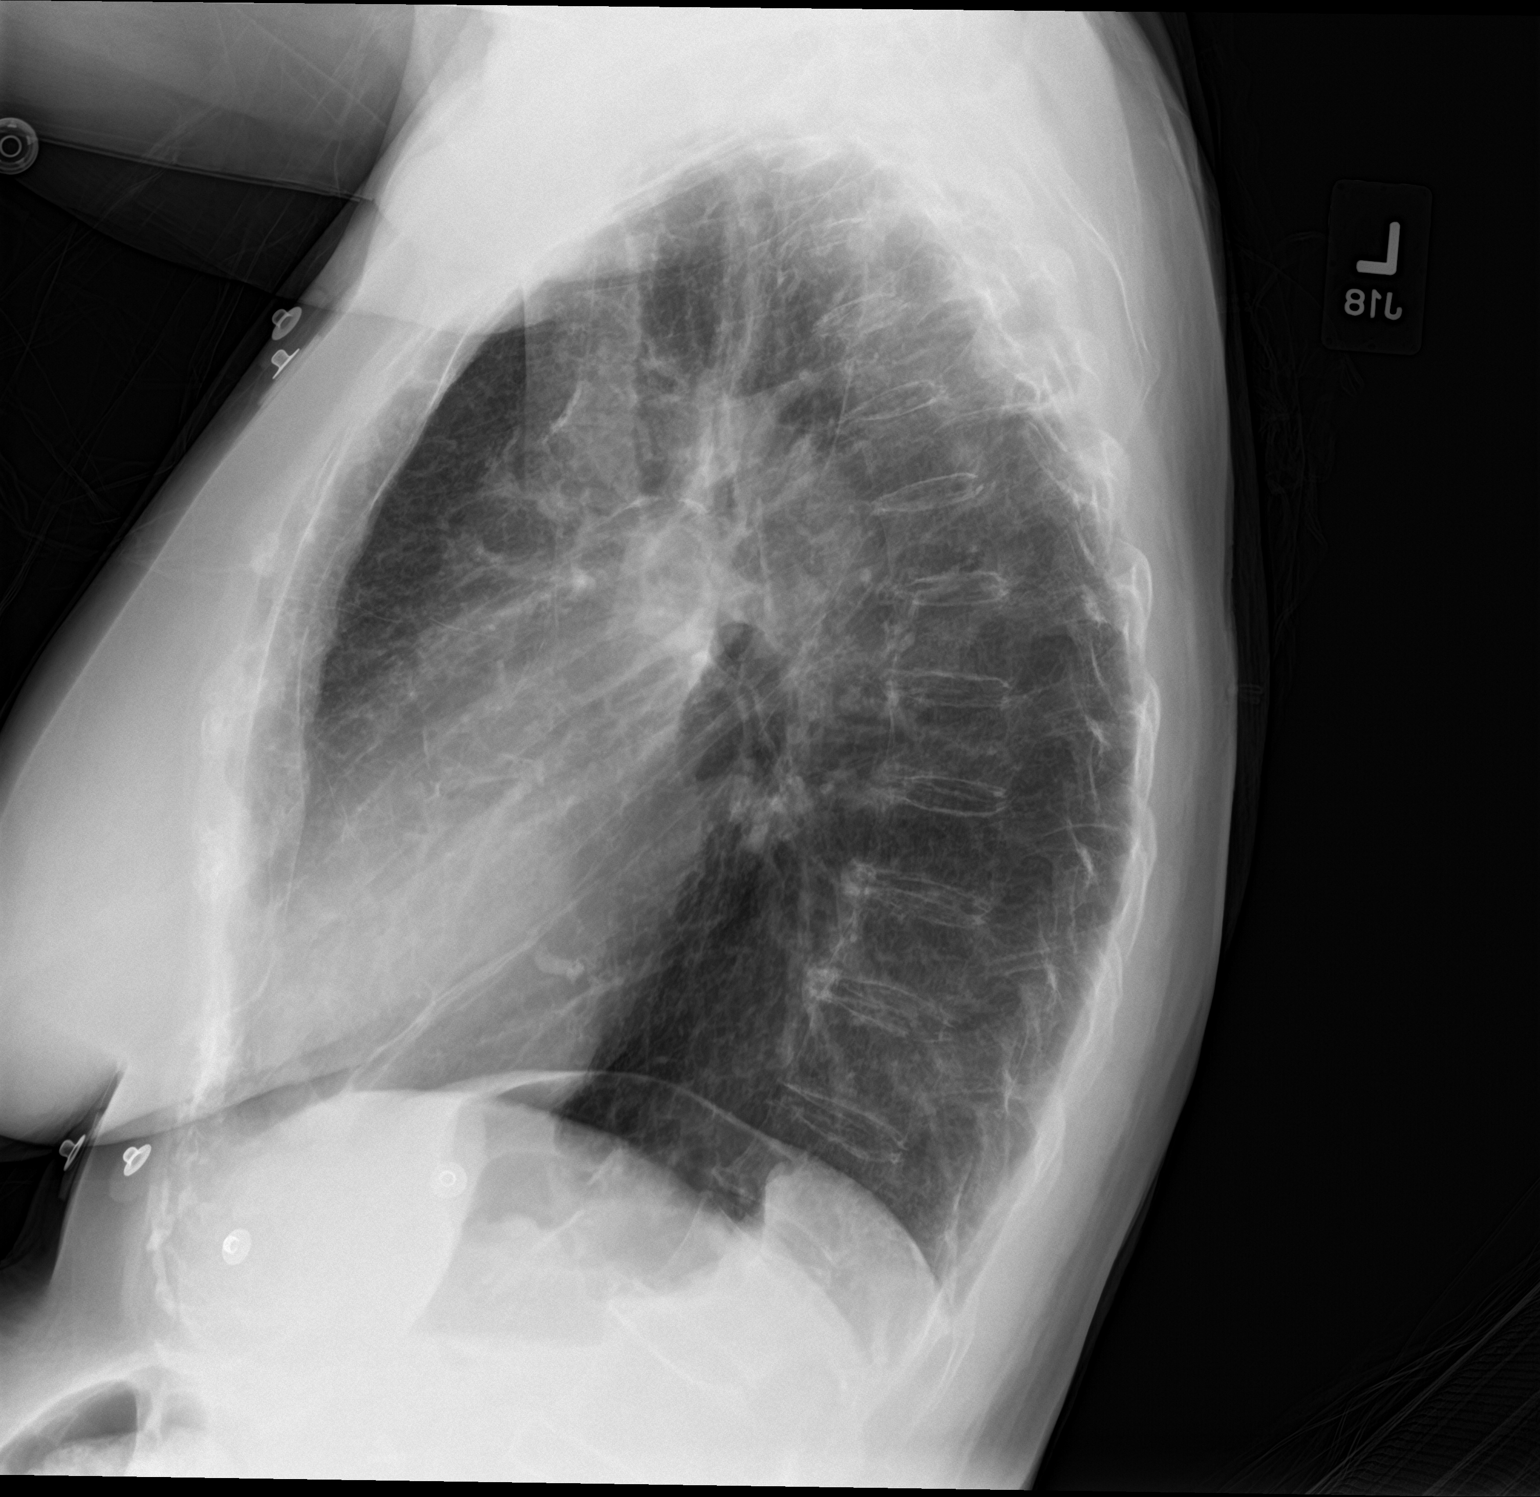

[2 of 2 positions shown; findings below may reference images not displayed]

FINDINGS: Normal sized heart. Clear lungs. Right upper lobe surgical staples
and apical blebs. The lungs remain mildly hyperexpanded with mildly
prominent interstitial markings. Diffuse osteopenia.
IMPRESSION: 1. No acute abnormality.
2. Stable mild changes of COPD.

## 2018-03-03 IMAGING — CT CT L SPINE W/O CM
1 series · 1 of 1 positions shown · non-contrast
Comparison: Current lumbar spine radiographs. Previous CT dated
06/08/2014.

CLINICAL DATA: New compression fracture of L1 on standard
radiographs. New or unexplained back pain.

EXAM:
CT LUMBAR SPINE WITHOUT CONTRAST
TECHNIQUE: Multidetector CT imaging of the lumbar spine was performed without
intravenous contrast administration. Multiplanar CT image
reconstructions were also generated.

[Series 100: scout · coronal · 0.6mm · 0.98mm/px · 1 of 1 slices shown]
[im 1/1]
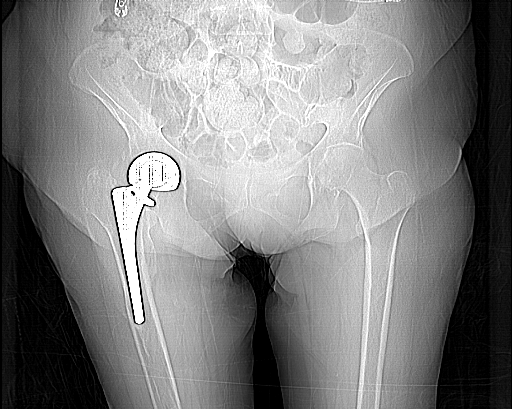

[1 of 1 positions shown; findings below may reference images not displayed]

FINDINGS: There is depression of the upper endplate of L1 reflecting a mild
compression fracture which is new from the prior CT. It also appears
new from a more recent chest radiograph, dated 09/23/2015.

There are no other fractures. There is no spondylolisthesis. A
Schmorl's node in density upper endplate of L4, stable. There is
mild loss of disc height at L4-L5 with moderate loss disc height at
L5-S1, also stable from the prior CT has all are anterior endplate
osteophytes noted throughout most of the visualized spine.

No bone lesion.

T12-L1: The posterior upper endplate of the L1 vertebrae mildly
indents the ventral thecal sac with out causing significant
stenosis. The neural foramina are well preserved. There is no no
surrounding hematoma or edema. The posterior elements are intact.

L1-L2:  Unremarkable.

L2-L3: Minor spondylotic disc bulging. No disc herniation. No
stenosis.

L3-L4: Minor spondylotic disc bulging. No disc herniation. No
stenosis.

L4-L5: Mild disc bulging. No disc herniation. No significant
stenosis.

L5-S1: Mild like disc bulging with endplate osteophytes. No disc
herniation. Central spinal canal and lateral recesses are well
preserved. Mild bilateral neural foraminal narrowing greater on the
left.

Small fusiform infrarenal abdominal aortic aneurysm measures 2.7 cm
in anterior-posterior dimension by 3.3 cm in length. This is without
significant change from the prior CT. Atherosclerotic calcifications
are noted throughout the visualized abdominal aorta and the iliac
vessels. Small low-density left adrenal mass consistent with an
adenoma measuring 12 mm. No other masses. No adenopathy.
IMPRESSION: 1. Mild compression fracture of L1. This is felt to be recent, not
visualized on a chest radiograph dated 09/23/2015. There is no
significant adjacent edema or hemorrhage. Fracture does not
significantly encroach upon central spinal canal or the neural
foramina.
2. No other fractures.
3. Degenerative changes as described greatest at L5-S1.
4. Small stable 2.7 cm fusiform infrarenal abdominal aortic
aneurysm. Ectatic abdominal aorta at risk for aneurysm development.
Recommend followup by ultrasound in 5 years. This recommendation
follows ACR consensus guidelines: White Paper of the ACR Incidental

## 2018-03-03 IMAGING — DX DG LUMBAR SPINE COMPLETE 4+V
5 series · 5 of 5 positions shown · non-contrast
Comparison: CT, 06/08/2014

ADDENDUM:
L1 fracture appears new since a chest radiograph dated 09/23/2015.
Fracture should be considered acute/recent.
CLINICAL DATA: Pt c/o back pain that affects the center and right
and left sides of her back, beginning at the level of the inferior
angle of the scapulae and extending down to the lumbar region. Pt
states there was no injury and she's experienced the pain for 5
days. No injuries.

EXAM:
LUMBAR SPINE - COMPLETE 4+ VIEW

[l-spine ap]
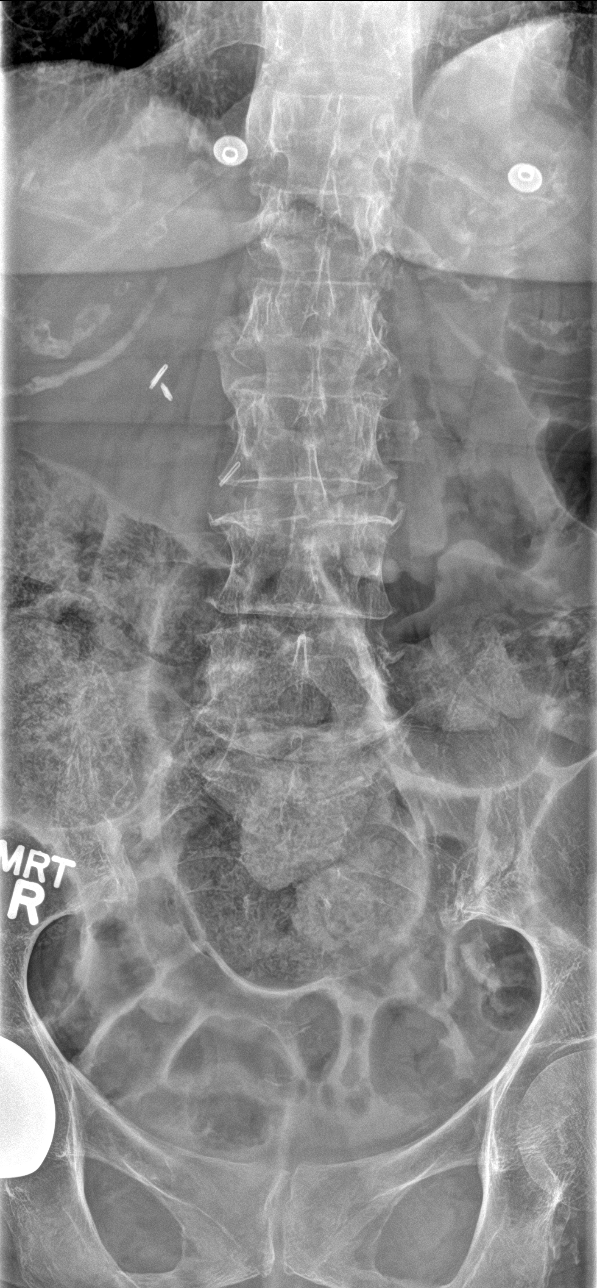

[l-spine obl (1 of 2)]
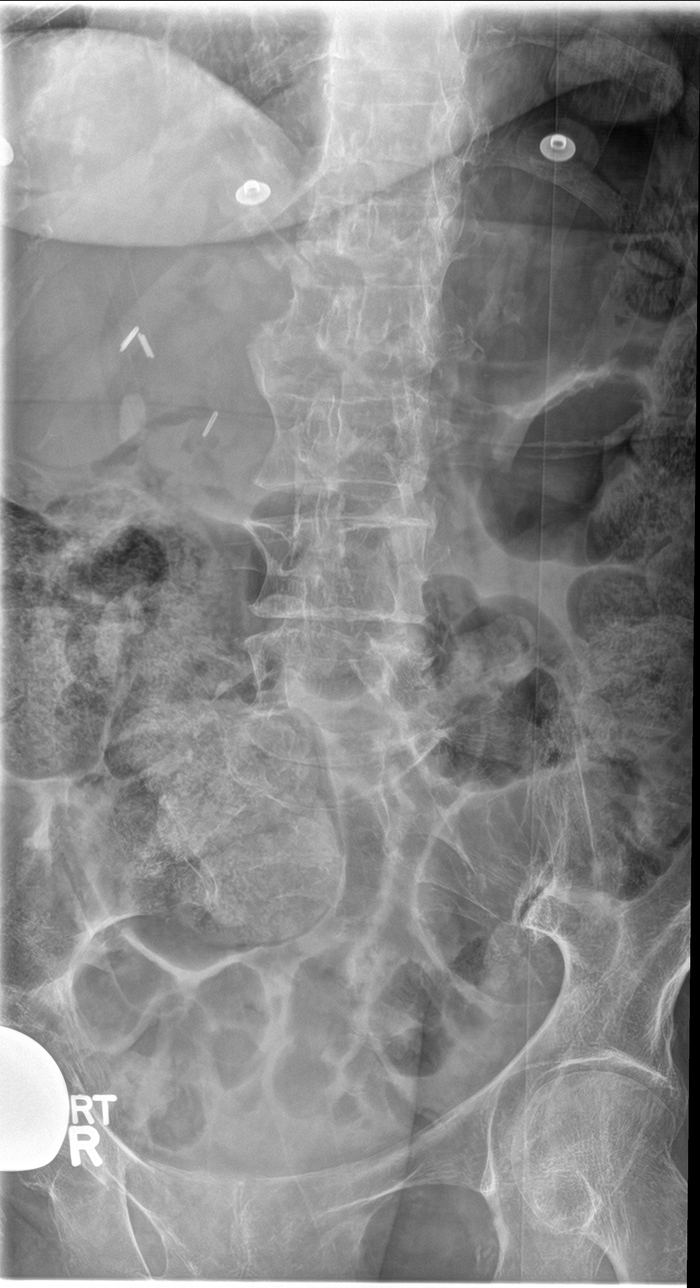

[l-spine obl (2 of 2)]
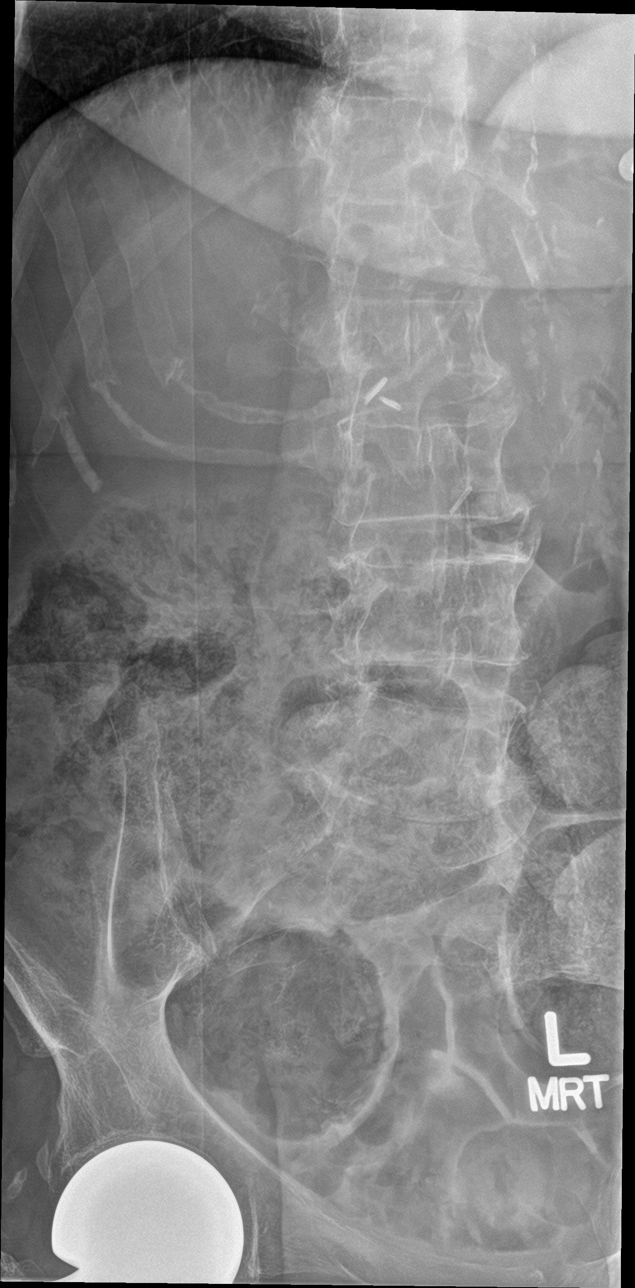

[l-spine lat]
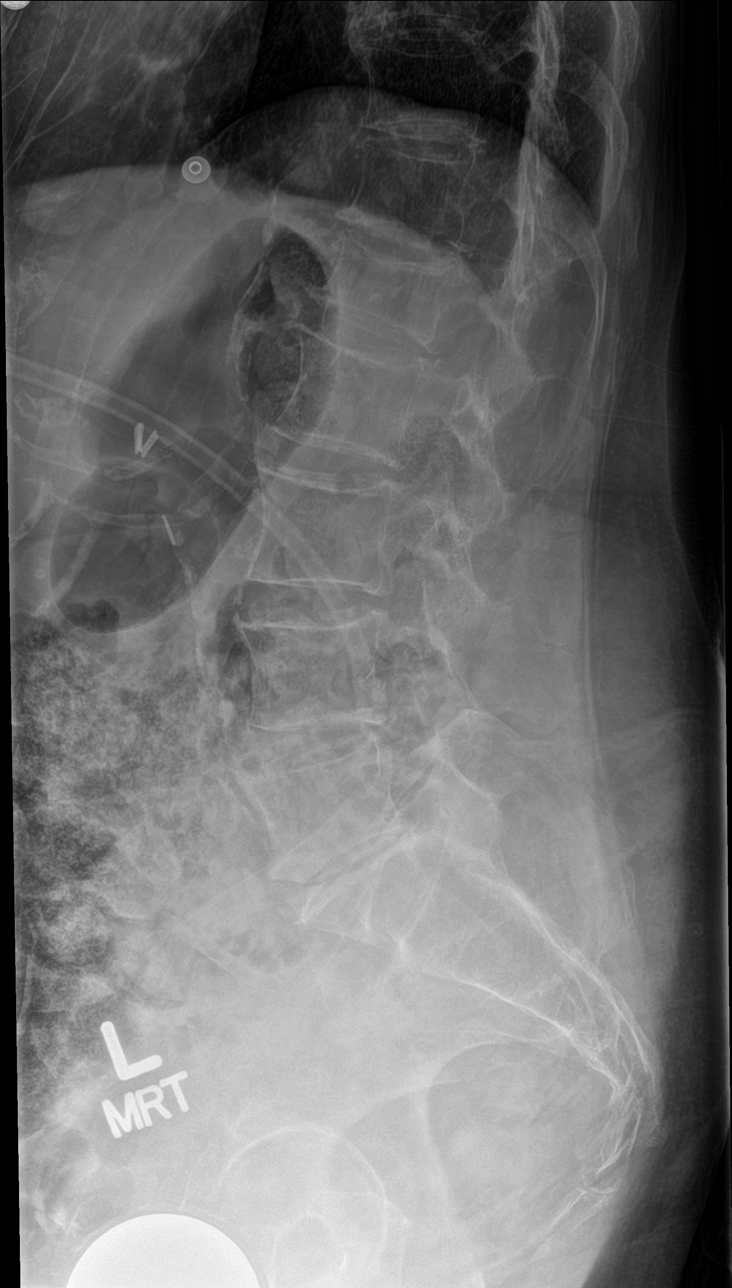

[l-spine spot]
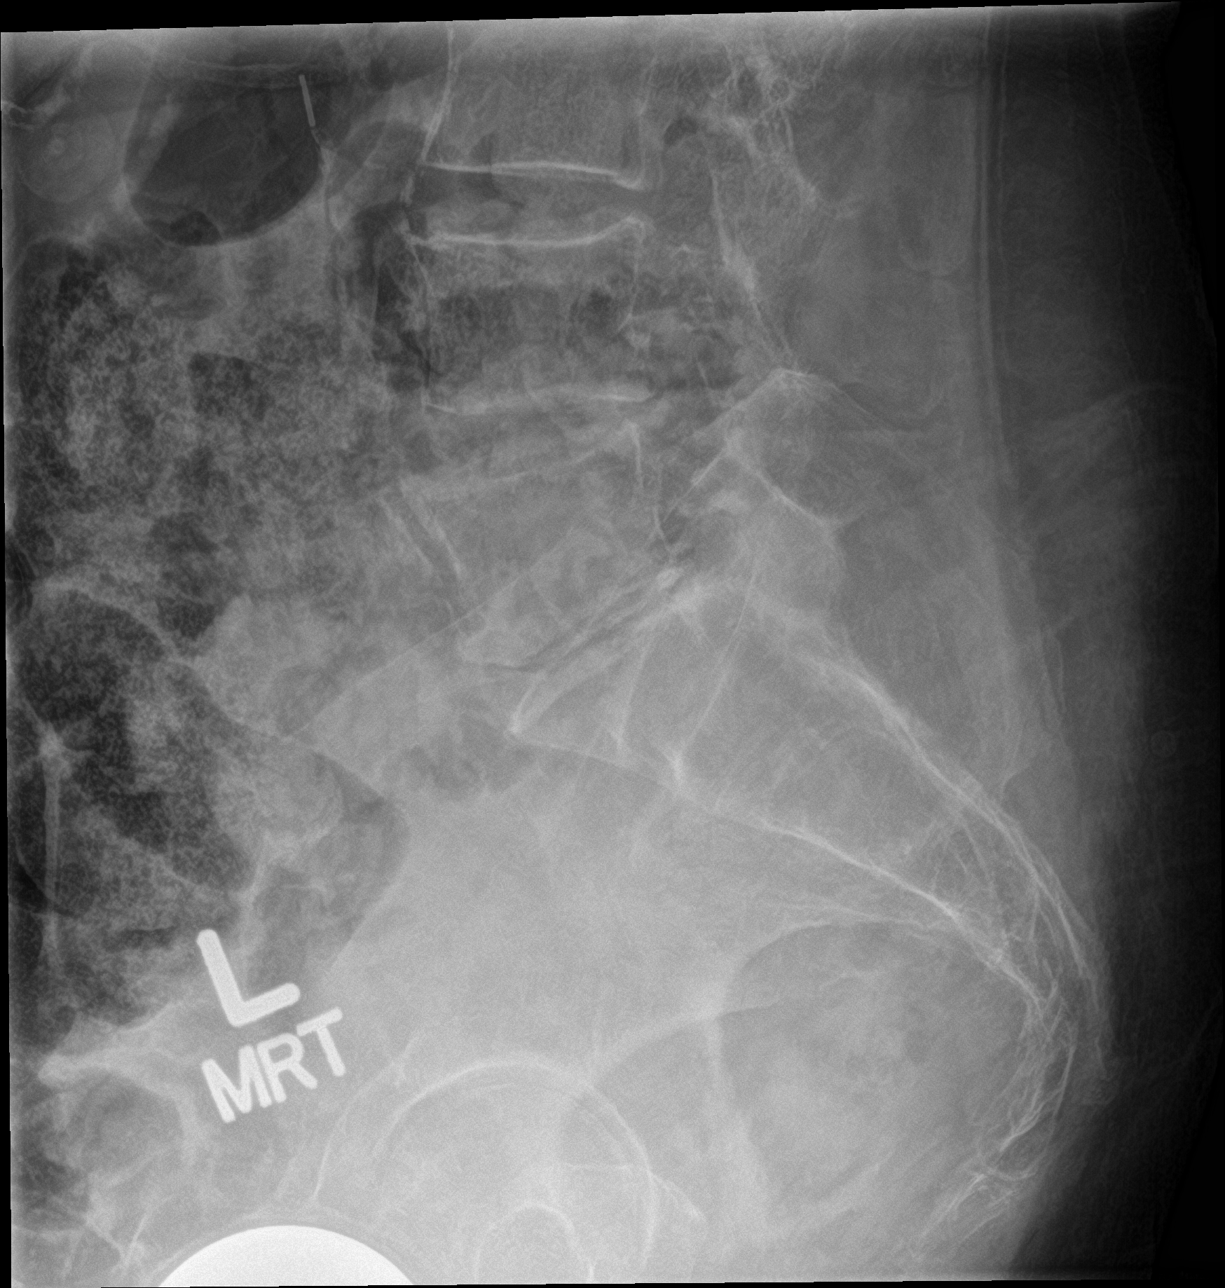

[5 of 5 positions shown; findings below may reference images not displayed]

FINDINGS: There is a mild to moderate compression fracture of L1, new when
compared to the prior CT. No other fractures. No spondylolisthesis.

Mild loss of disc height at L4-L5 with moderate loss of disc height
at L5-S1. Small endplate osteophytes are noted throughout the lumbar
spine.

Bones are diffusely demineralized.

Moderate increased stool is evident in the visualize colon.
IMPRESSION: 1. Mild to moderate compression fracture of L1 new since the CT
dated 06/08/2014. Further characterization of the chronicity of this
fracture could be assessed with MRI.
2. No other fractures. No spondylolisthesis. Degenerative changes as
described greatest at L5-S1.

## 2018-03-03 IMAGING — DX DG THORACIC SPINE 2V
3 series · 3 of 3 positions shown · non-contrast
Comparison: Chest x-ray 09/23/2015

CLINICAL DATA: Back pain

EXAM:
THORACIC SPINE 2 VIEWS

[t-spine ap]
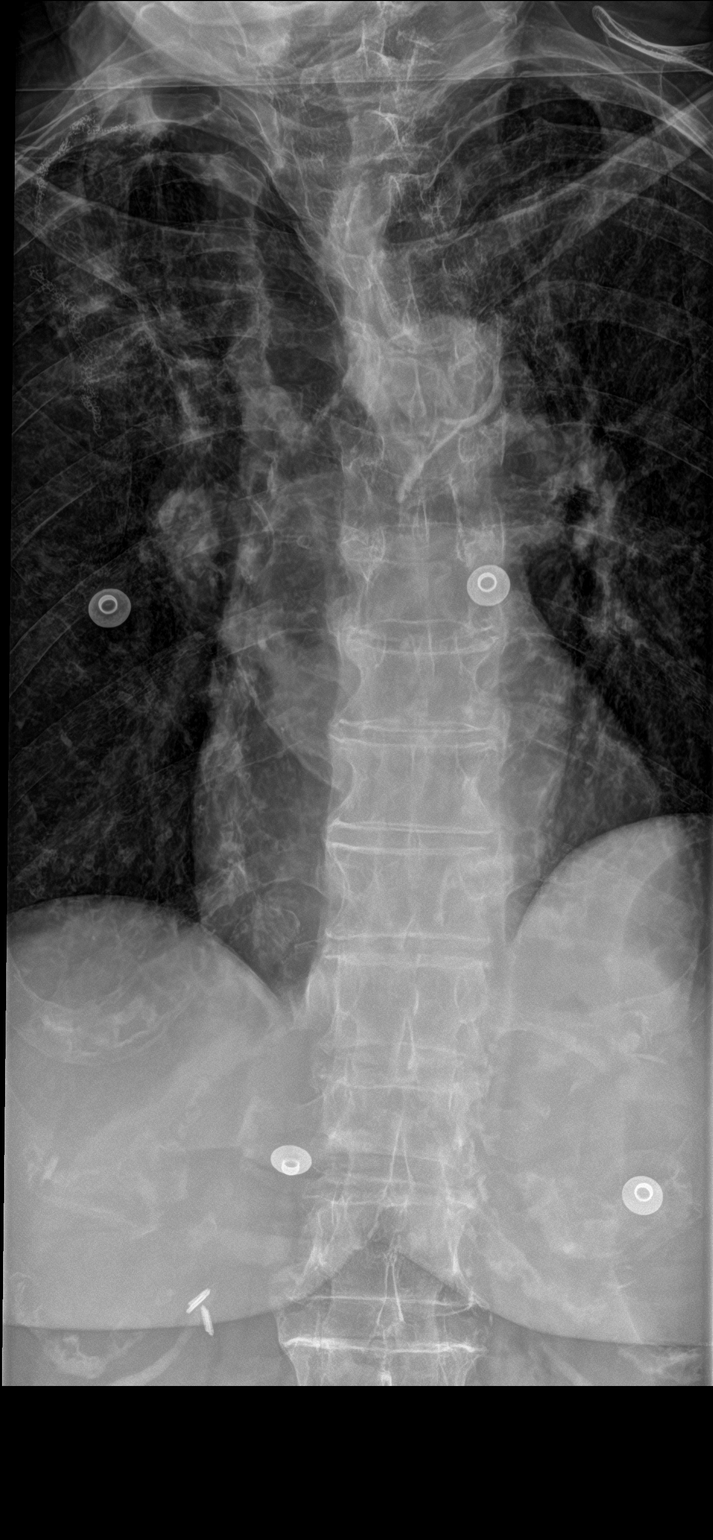

[t-spine lat]
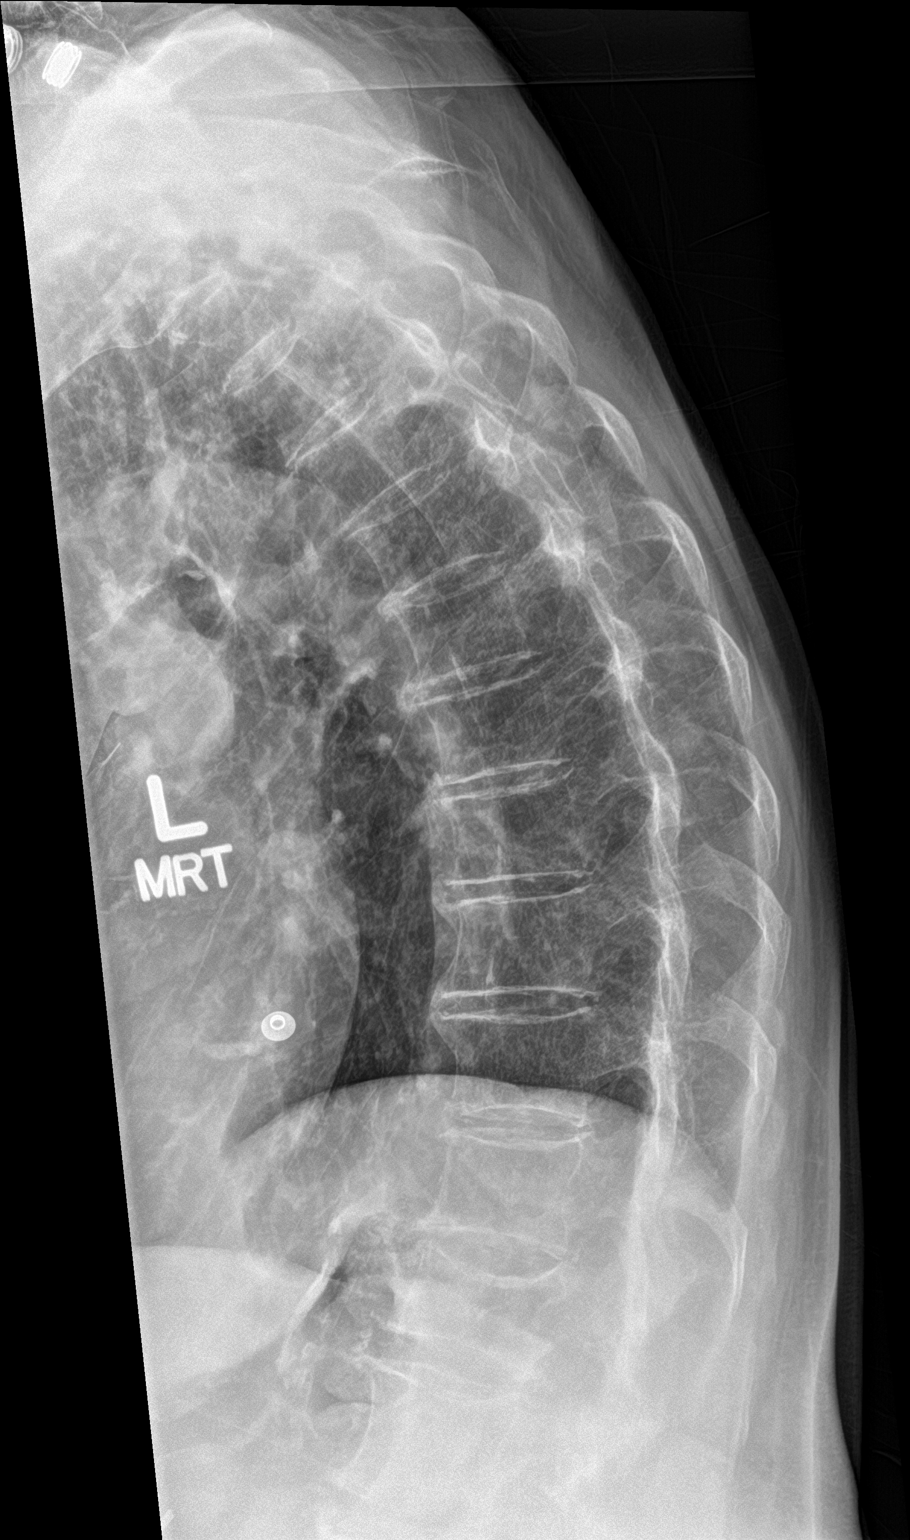

[t-spine swimmers]
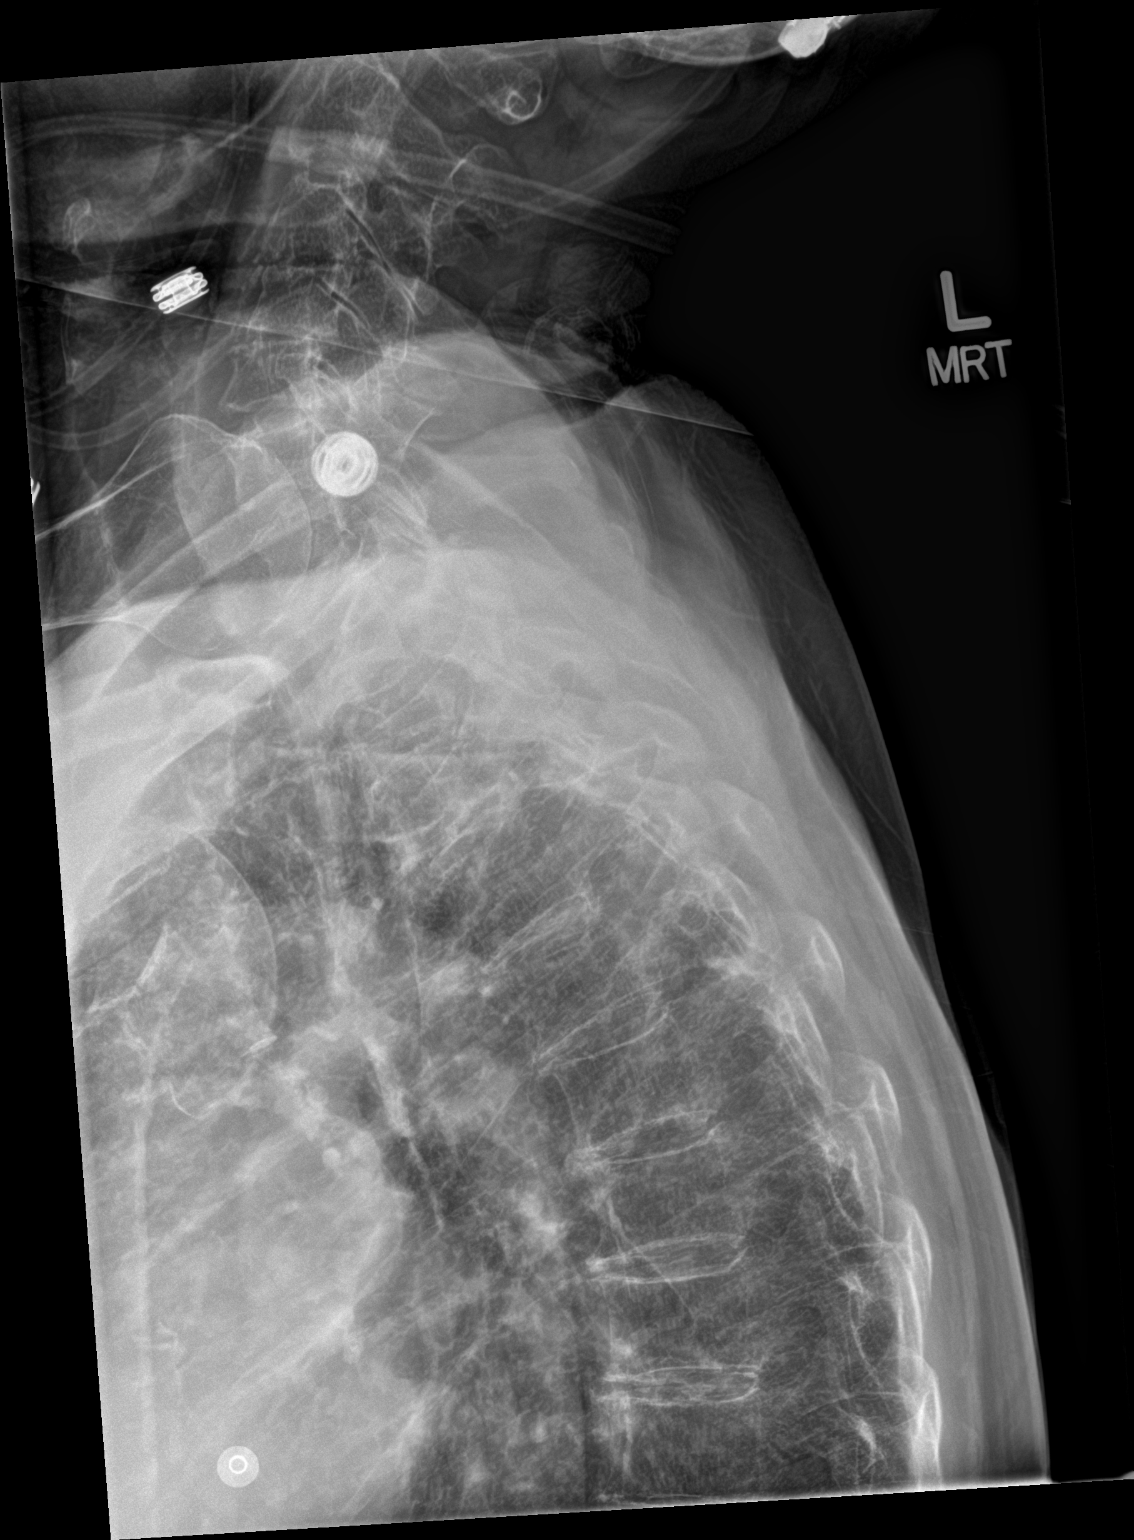

[3 of 3 positions shown; findings below may reference images not displayed]

FINDINGS: L1 superior endplate deformity as described on dedicated lumbar
radiography. This finding is new from 09/23/2015 chest x-ray. No
thoracic spine fracture or subluxation is noted. Usual degenerative
changes. Osteopenia.

Chronic reticulation around lung sutures at the right apex.
IMPRESSION: 1. No evidence of thoracic spine injury.
2. L1 superior endplate deformity, reference lumbar spine exam.

## 2018-04-22 IMAGING — CR DG CHEST 1V PORT
1 series · 1 of 1 positions shown · non-contrast
Comparison: 09/23/2015

CLINICAL DATA: Nausea and shortness of breath for the last 2 days.
Weakness.

EXAM:
PORTABLE CHEST 1 VIEW

[AP]
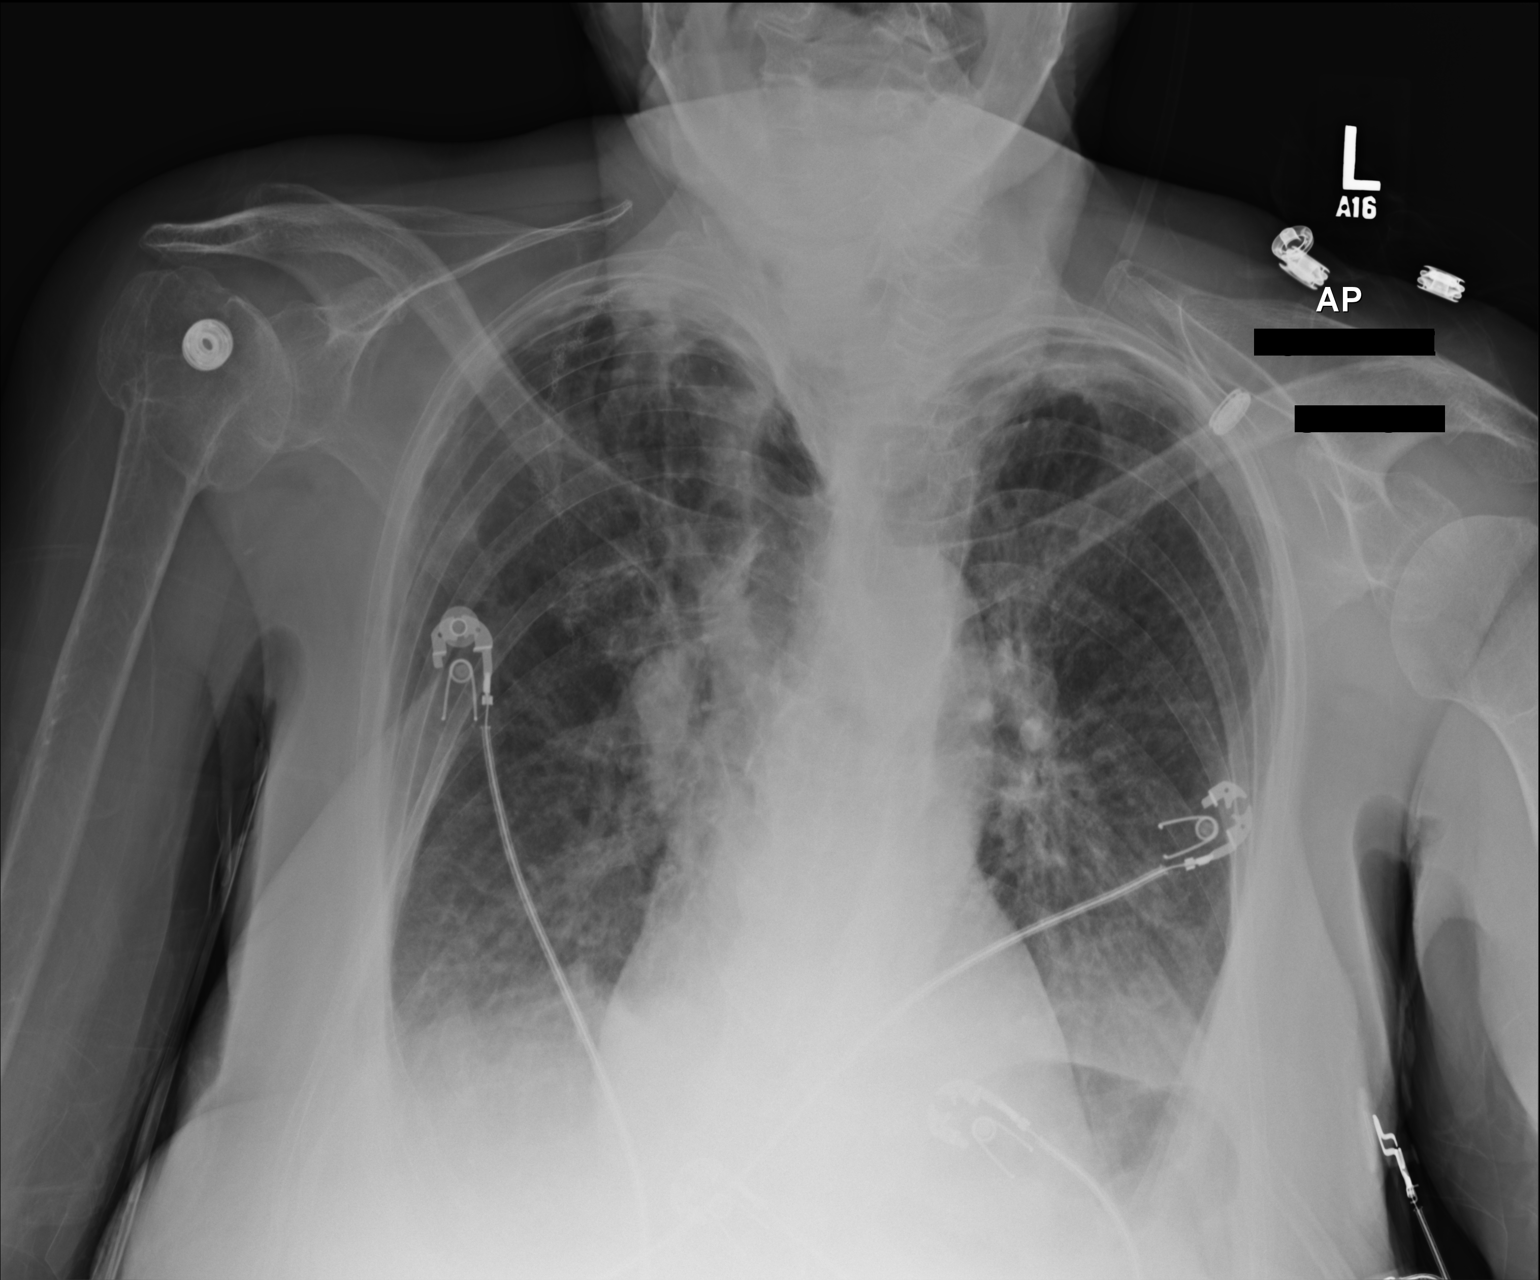

[1 of 1 positions shown; findings below may reference images not displayed]

FINDINGS: Severe emphysema with biapical scarring and postoperative the wedge
resection clips at the right lung apex. The degree of apical volume
loss/ scarring appears mildly increased from the prior exam although
some of this may be positional. Extensive kyphosis.

Atherosclerotic aortic arch and tortuosity. Newly indistinct
hemidiaphragms partially from projection but also potentially from
bibasilar airspace opacities. Heart size felt to be within normal
limits. Scattered scarring in the lungs.

Chronic deformity from old fracture the right proximal humerus,
healed.

Bilateral airway thickening noted.
IMPRESSION: 1. Bibasilar airspace opacities potentially from atelectasis or
pneumonia.
2. Extensive biapical pleuroparenchymal scarring with some
postoperative findings at the right lung apex.
3. Atherosclerotic aortic arch.
4. Severe emphysema.
5. Increased bilateral airway thickening suggesting
inflammation/bronchitis.

## 2018-04-24 IMAGING — DX DG ABDOMEN 2V
2 series · 2 of 2 positions shown · non-contrast
Comparison: Chest 09/23/2015

CLINICAL DATA: Abdominal pain for a few days. History of
hypertension, COPD, asthma, shortness of breath.

EXAM:
ABDOMEN - 2 VIEW

[abdomen erect]
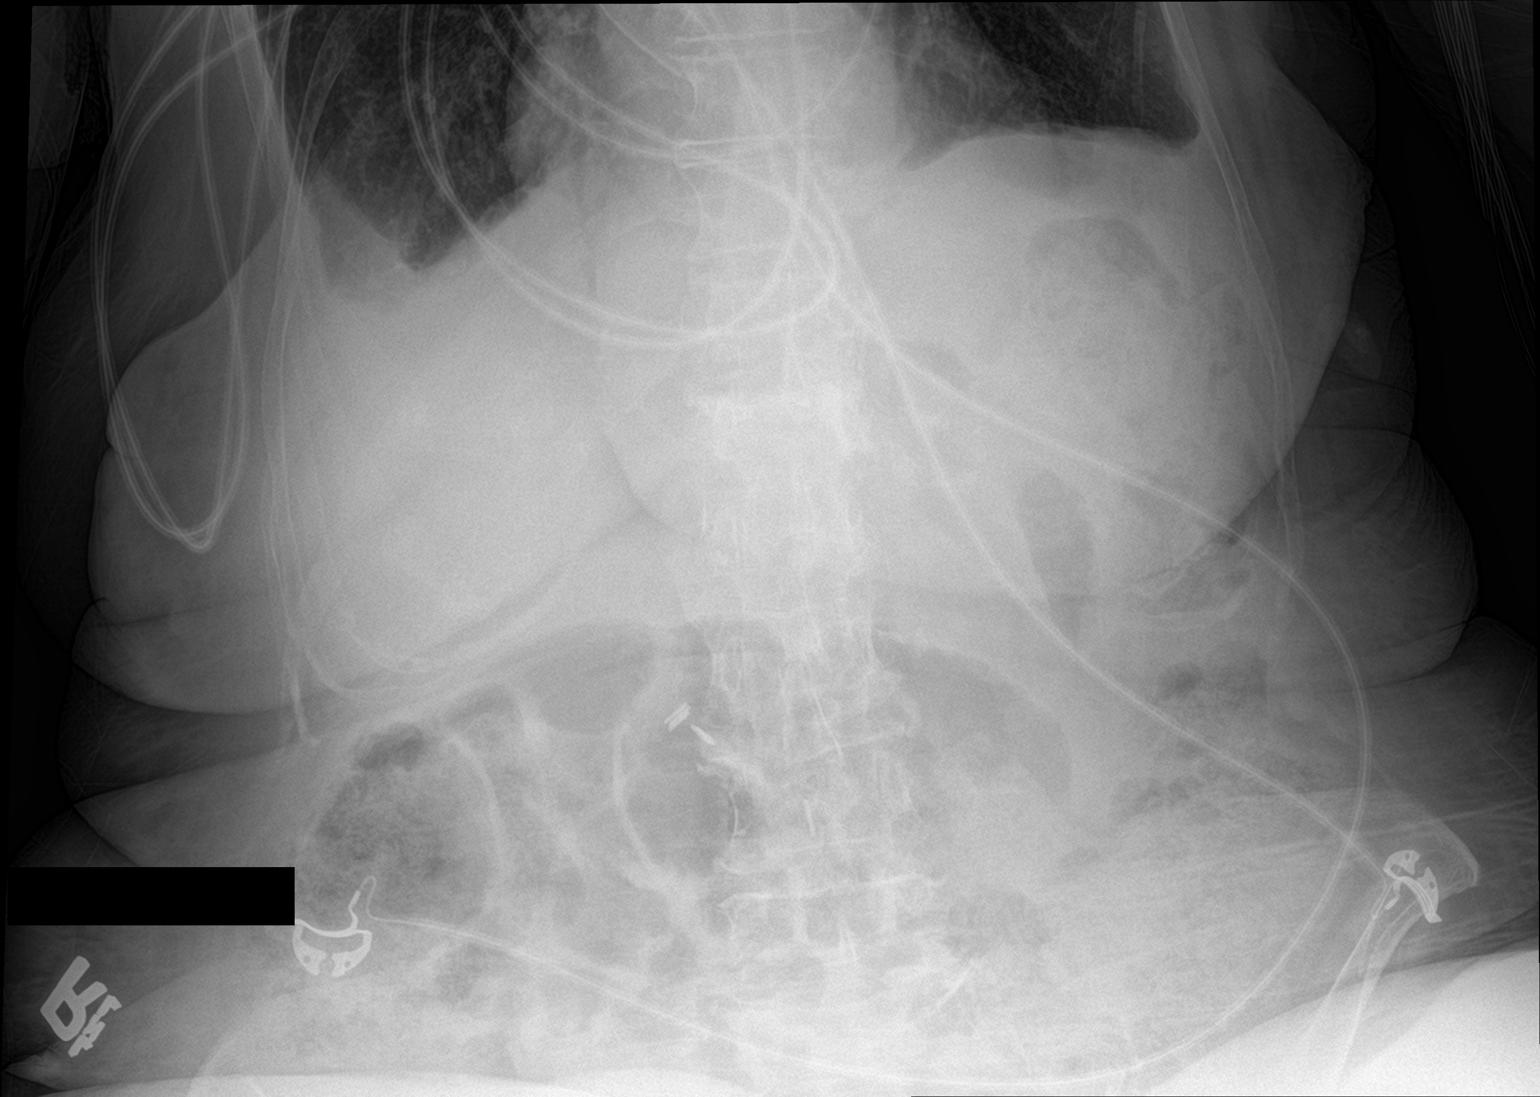

[abdomen supine]
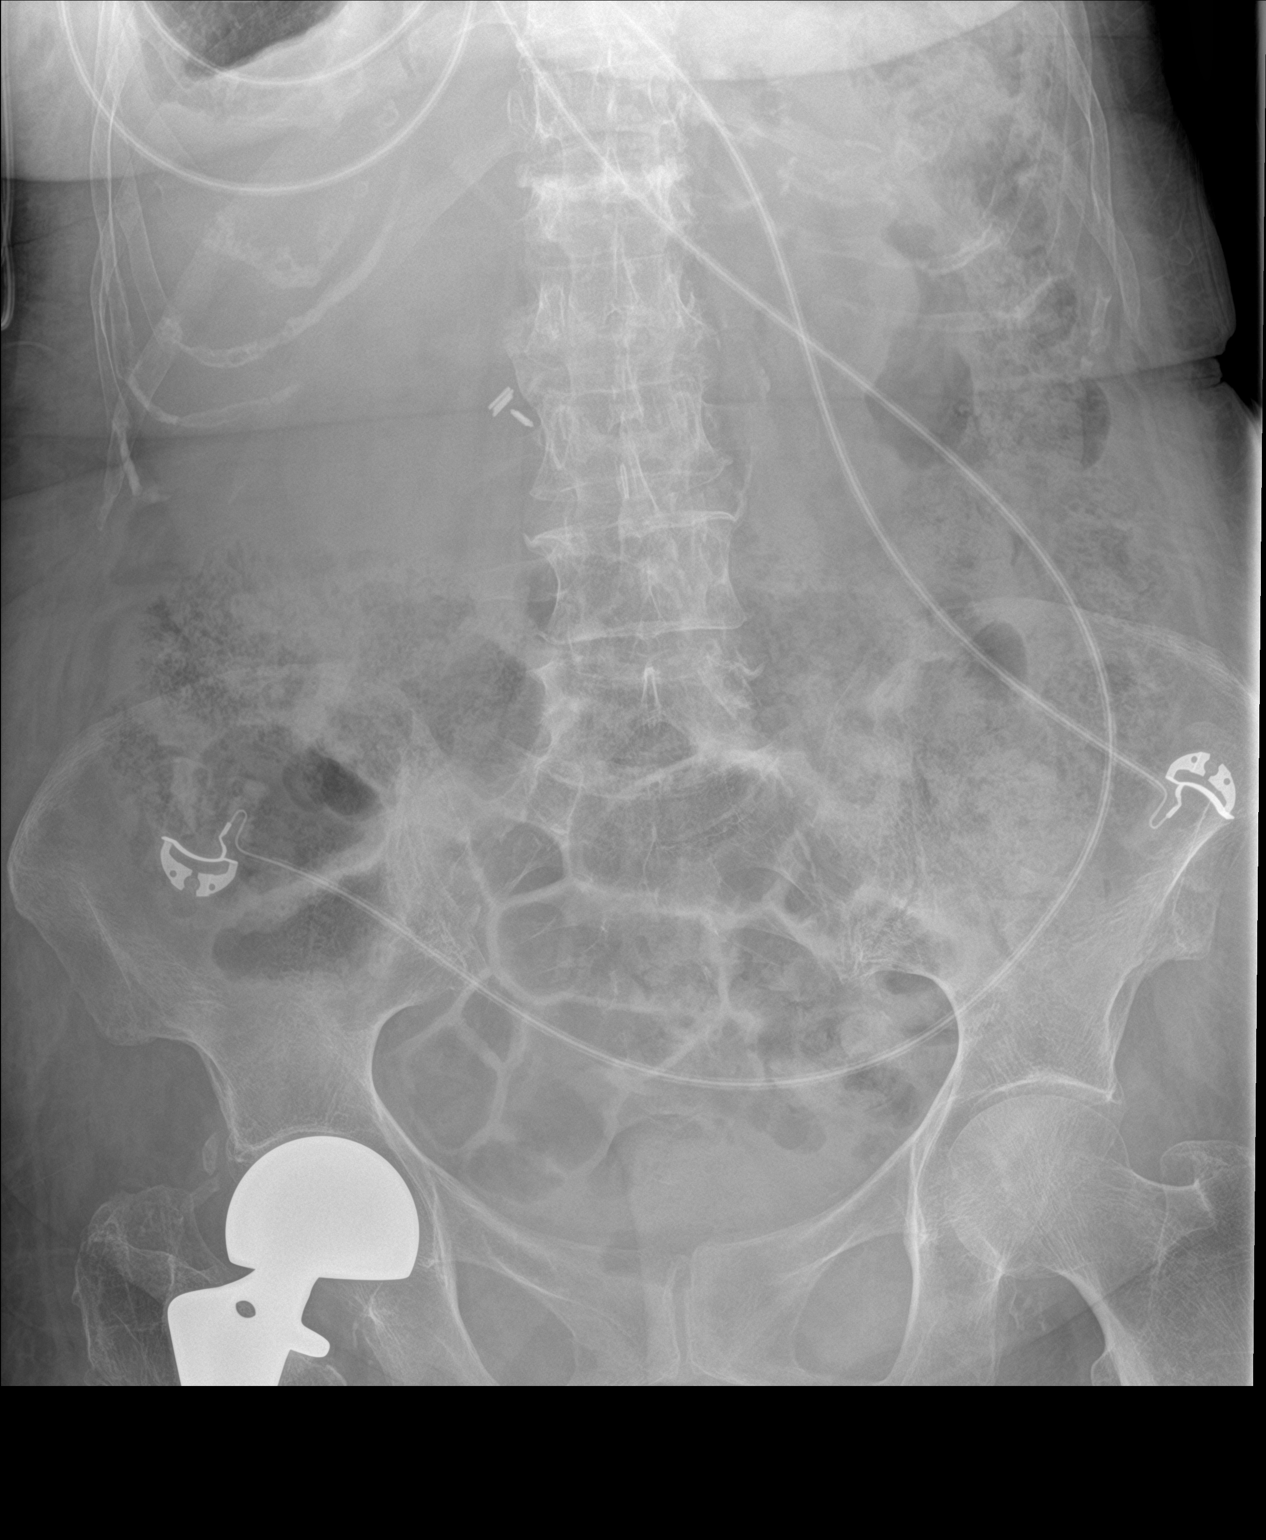

[2 of 2 positions shown; findings below may reference images not displayed]

FINDINGS: Gas and stool throughout the colon. No small or large bowel
distention. No free intra-abdominal air. No abnormal air-fluid
levels. No radiopaque stones. Surgical clips in the right upper
quadrant. Vascular calcifications. Degenerative changes in the
spine. Right hip hemiarthroplasty. Small pleural effusions in the
lung bases.
IMPRESSION: Nonobstructive bowel gas pattern with diffusely stool-filled colon.
Small bilateral pleural effusions.

## 2018-05-30 IMAGING — CR DG CHEST 2V
2 series · 2 of 2 positions shown · non-contrast
Comparison: 12/10/2015

CLINICAL DATA: Dyspnea, onset today

EXAM:
CHEST  2 VIEW

[chest lat]
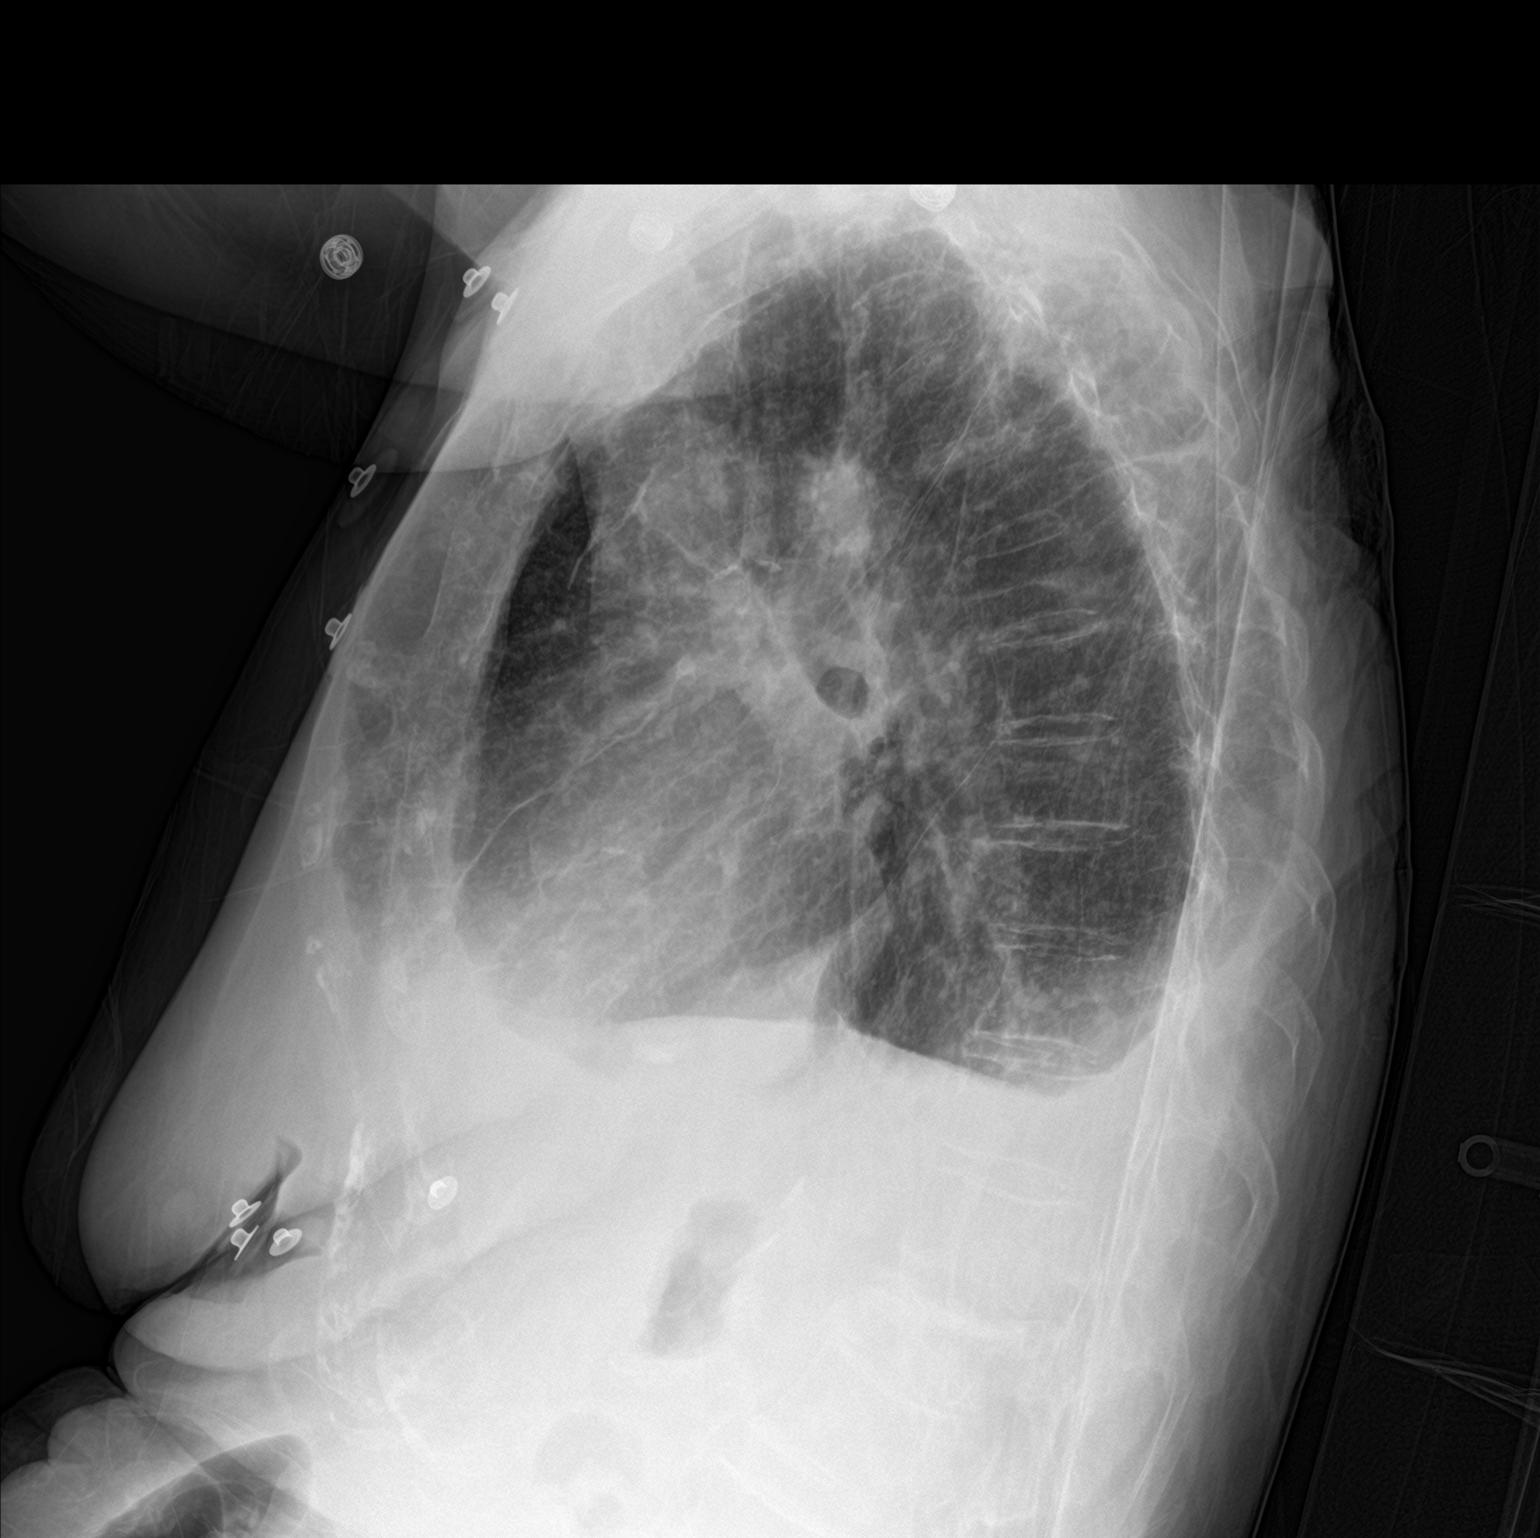

[chest ap]
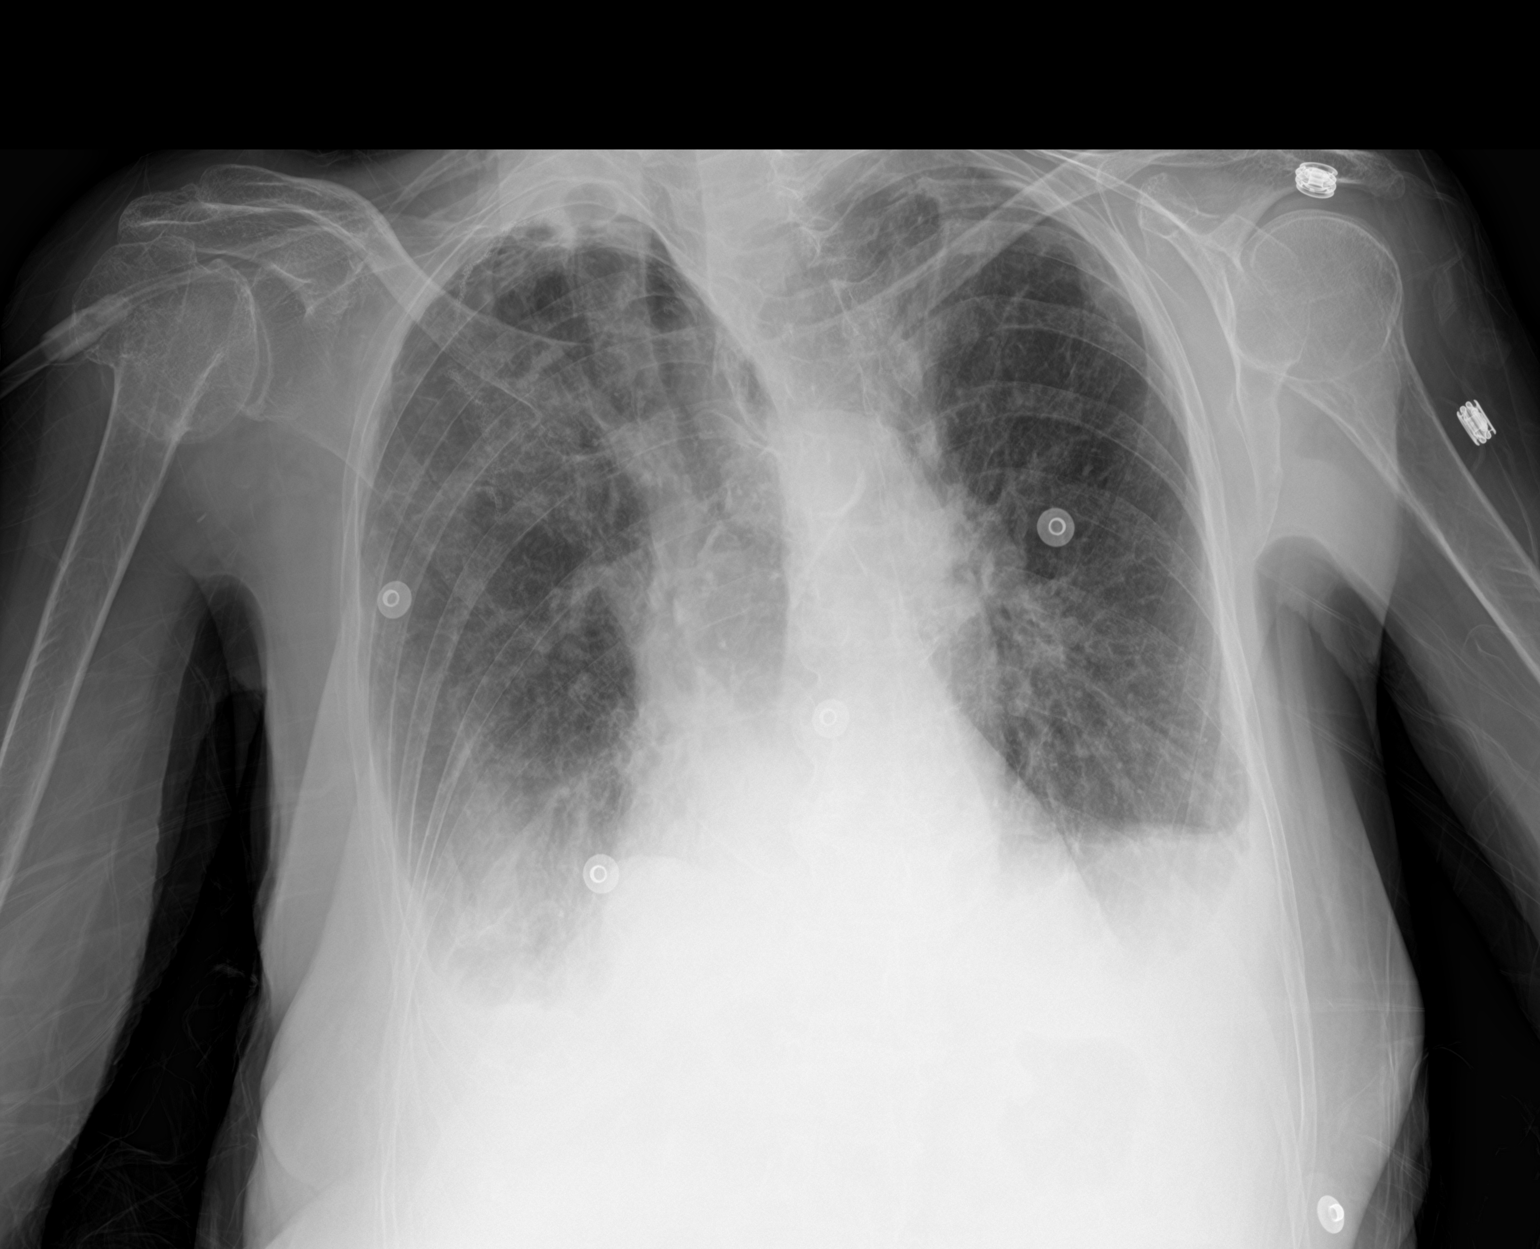

[2 of 2 positions shown; findings below may reference images not displayed]

FINDINGS: There are pleural effusions bilaterally. There is mild vascular and
interstitial fullness. No focal airspace consolidation. Unchanged
cardiomegaly.
IMPRESSION: Findings may represent mild congestive heart failure. Bilateral
pleural effusions.

## 2018-06-02 IMAGING — CR DG CHEST 2V
2 series · 2 of 2 positions shown · non-contrast
Comparison: Prior chest x-ray 01/17/2016

CLINICAL DATA: 75-year-old female with congestive heart failure

EXAM:
CHEST  2 VIEW

[chest lat]
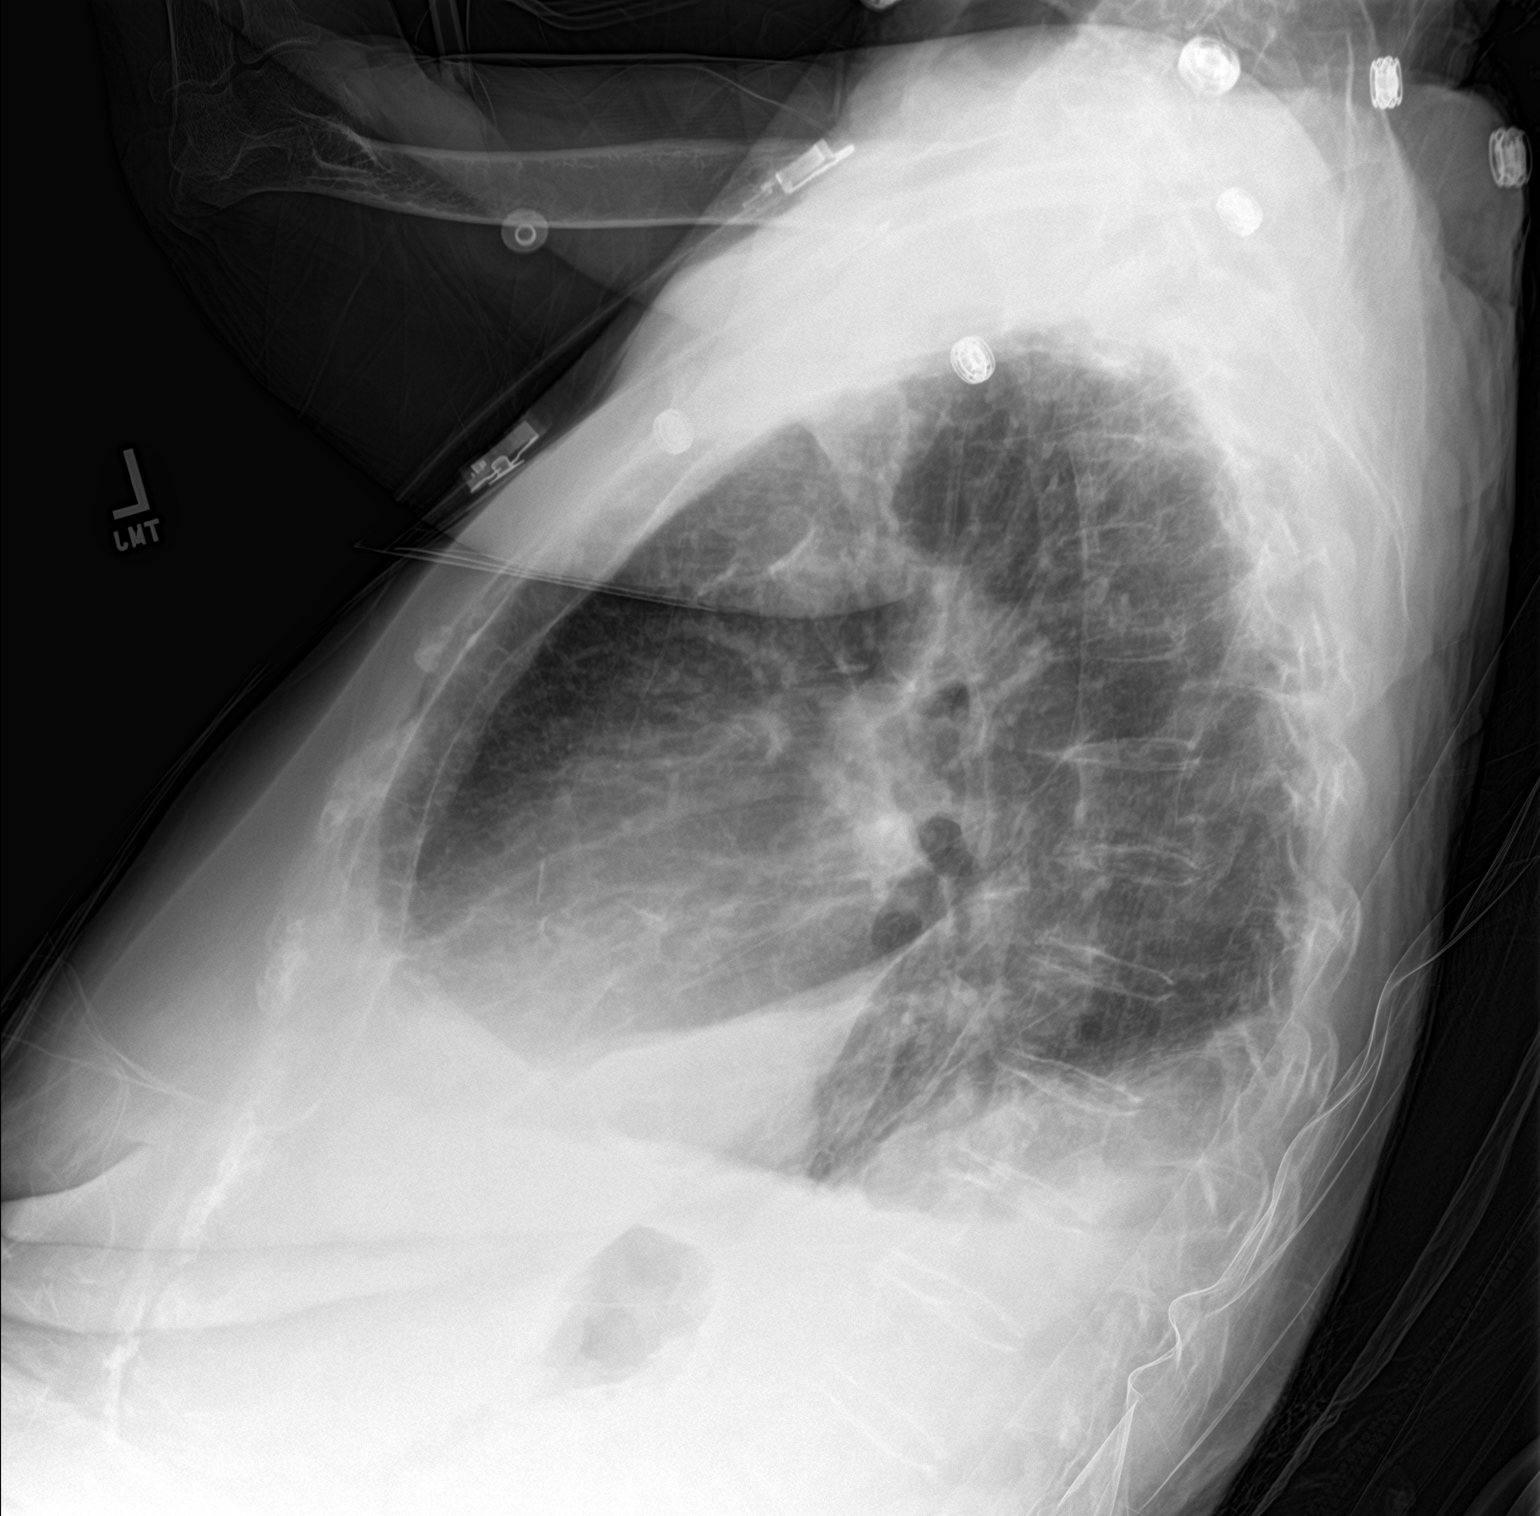

[chest ap]
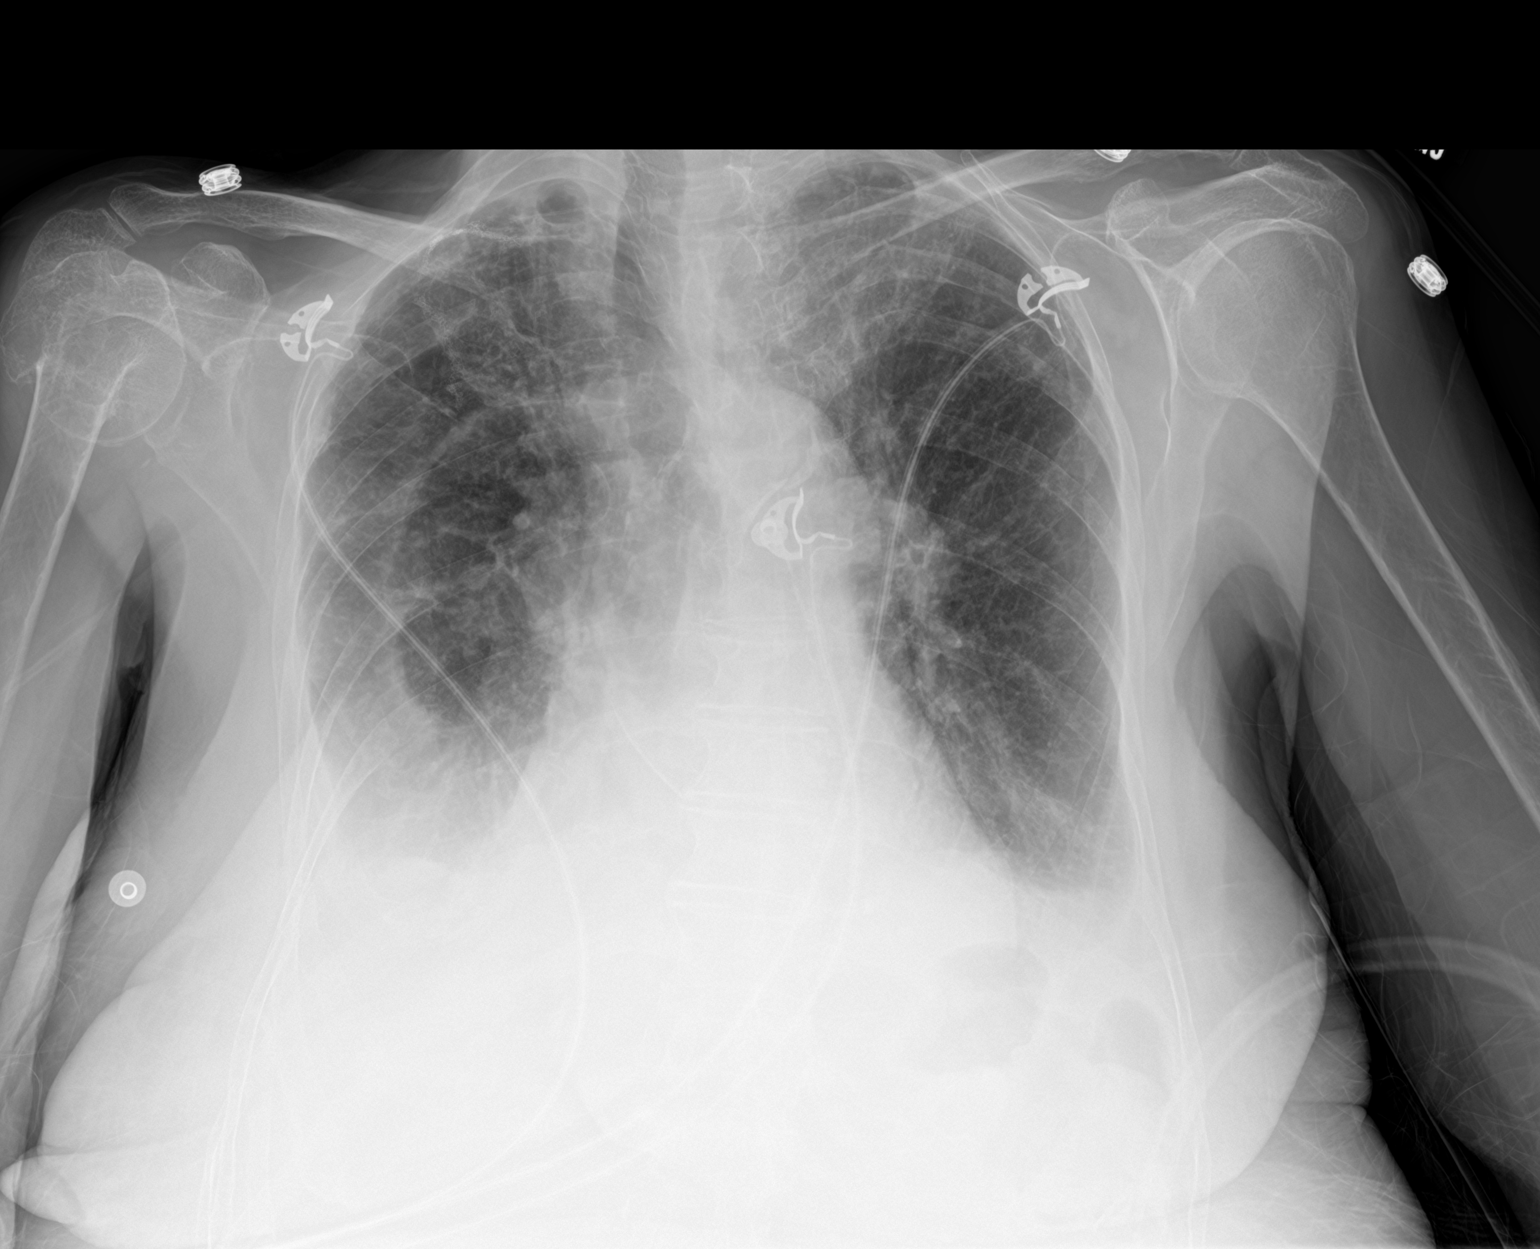

[2 of 2 positions shown; findings below may reference images not displayed]

FINDINGS: Slightly increased bilateral layering pleural effusions and
bibasilar atelectasis compared to prior. The right pleural effusion
is larger than the left. Background of mild interstitial edema
superimposed on emphysema and chronic bronchitic changes. Stable
cardiomegaly. Atherosclerotic calcification present in the
transverse aorta. Stable right apical pleural parenchymal scarring.
Remote right proximal humeral fracture again identified. No acute
osseous abnormality.
IMPRESSION: 1. Slightly increased right greater than left pleural effusions and
associated bibasilar atelectasis.
2. Persistent mild interstitial pulmonary edema superimposed on a
background of emphysema and chronic bronchitic changes. Emphysema.
([id number])
3.  Aortic Atherosclerosis ([id number]-170.0)

## 2018-08-04 IMAGING — CR DG CHEST 1V PORT
1 series · 1 of 1 positions shown · non-contrast
Comparison: Two-view chest x-ray 01/20/2016

CLINICAL DATA: Shortness breath.  Mid chest discomfort.

EXAM:
PORTABLE CHEST 1 VIEW

[AP]
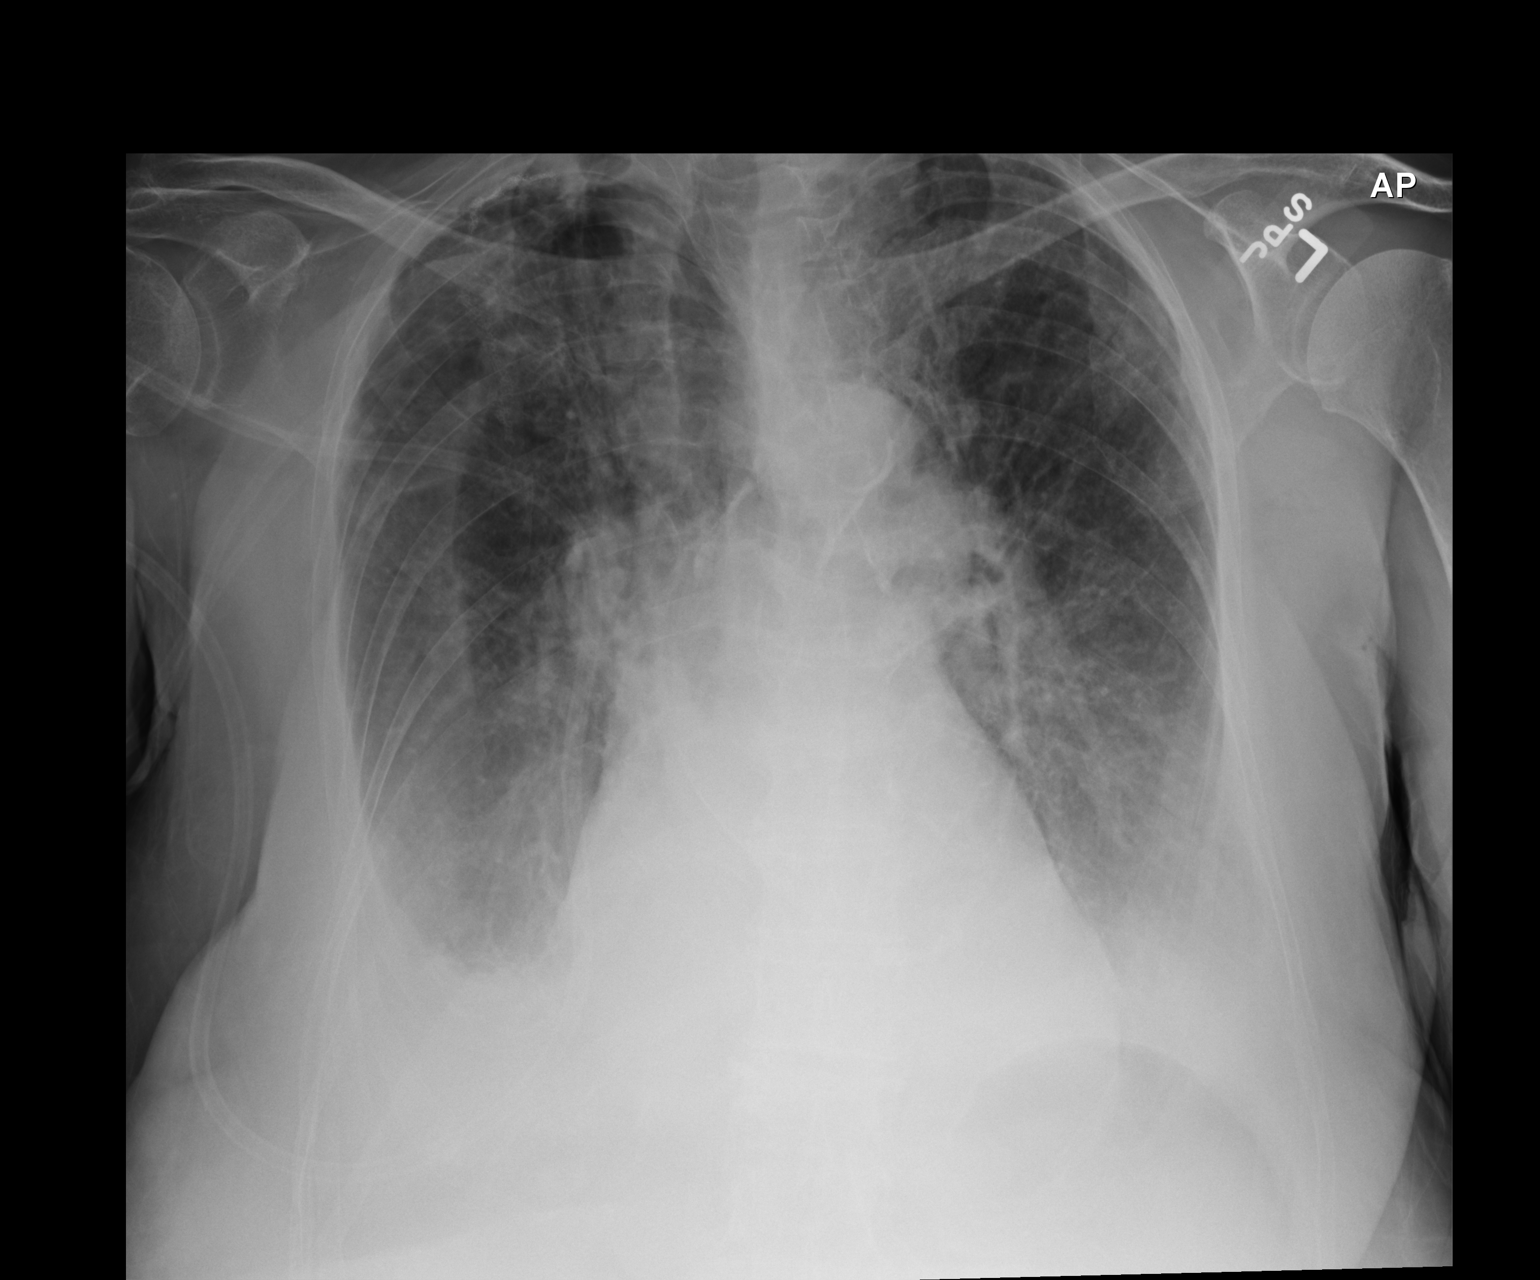

[1 of 1 positions shown; findings below may reference images not displayed]

FINDINGS: The heart is mildly enlarged. Atherosclerotic calcifications are
present at the arch. There is continued increase in diffuse
interstitial edema and bilateral effusions. Bilateral lower lobe
airspace disease is evident.
IMPRESSION: 1. Increasing interstitial edema and bilateral pleural effusions
compatible with worsening congestive heart failure.
2. Bibasilar airspace disease likely reflects atelectasis.
3. Aortic atherosclerosis.

## 2018-09-04 IMAGING — CR DG CHEST 2V
2 series · 2 of 2 positions shown · non-contrast
Comparison: 03/23/2016 CXR

CLINICAL DATA: Chest pain

EXAM:
CHEST  2 VIEW

[chest lat]
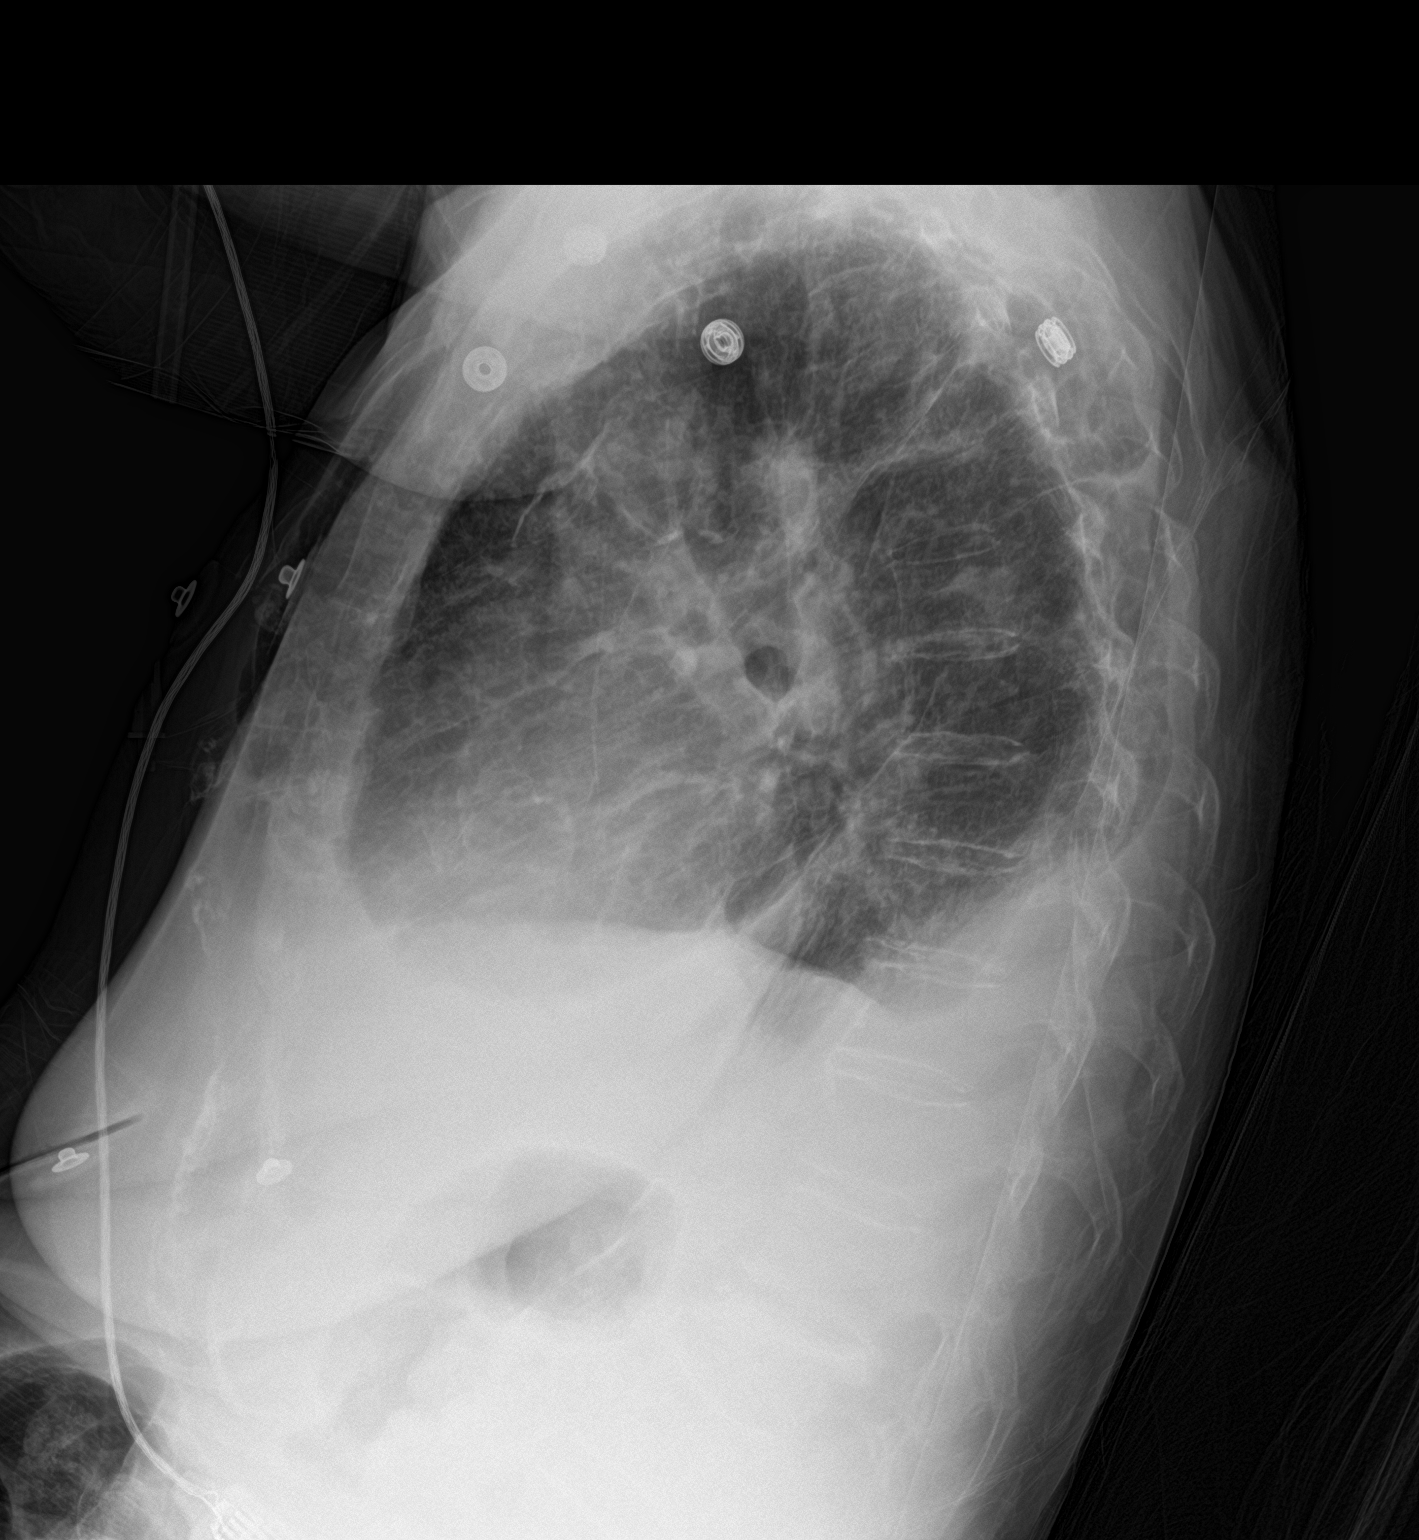

[chest ap]
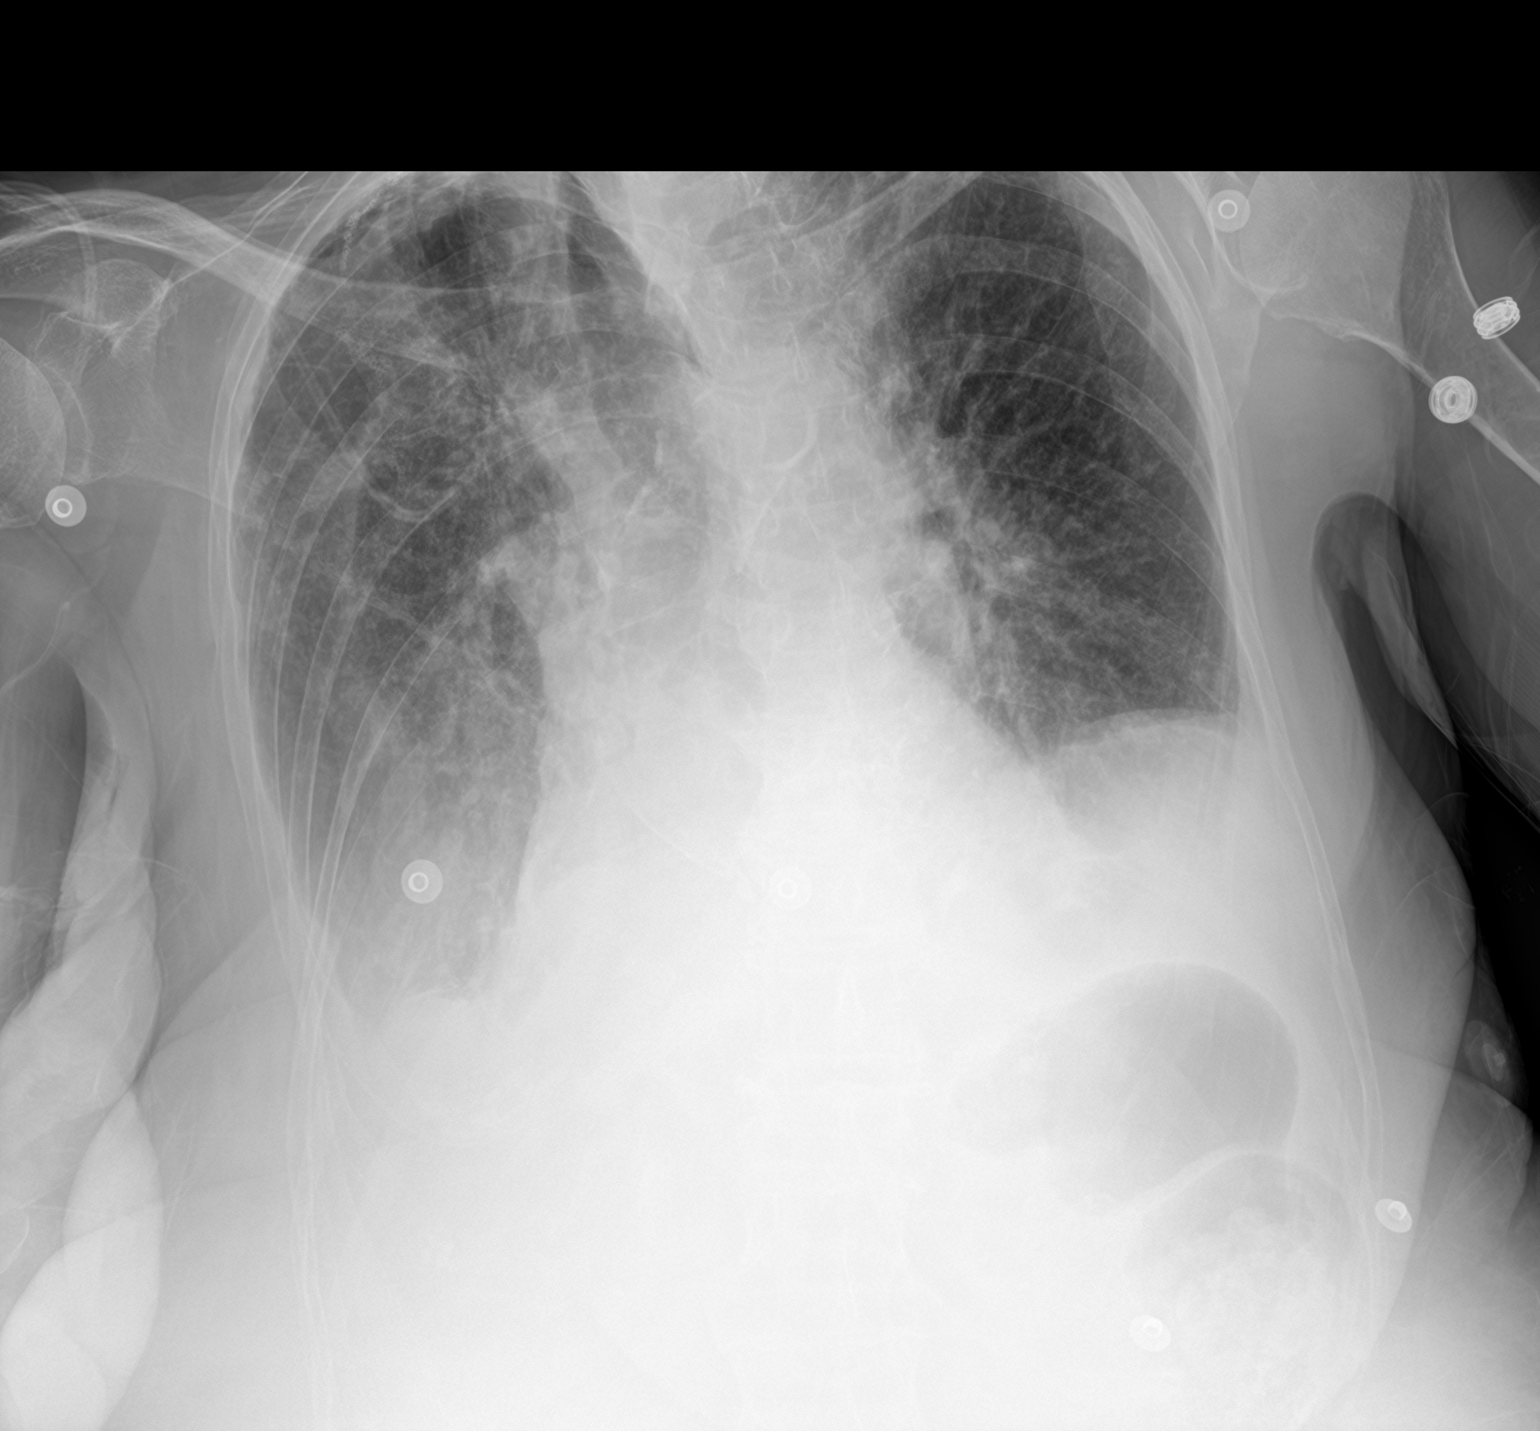

[2 of 2 positions shown; findings below may reference images not displayed]

FINDINGS: Stable cardiomegaly with aortic atherosclerosis. Interstitial
pulmonary edema persists with bilateral small pleural effusions
right slightly greater than left. Superimposed pneumonia at the
right lung base is not entirely excluded. No acute osseous
abnormality abdominal.
IMPRESSION: Cardiomegaly with aortic atherosclerosis. Interstitial pulmonary
edema with bilateral right greater left pleural effusions.
Superimposed pneumonia at the right lung base with be difficult to
entirely exclude.
